# Patient Record
Sex: Female | Born: 1940
Health system: Southern US, Community
[De-identification: ages and names within clinical notes are randomized; demographics above are authoritative.]

## PROBLEM LIST (undated history)

## (undated) DIAGNOSIS — G243 Spasmodic torticollis: Secondary | ICD-10-CM

## (undated) DIAGNOSIS — F419 Anxiety disorder, unspecified: Secondary | ICD-10-CM

## (undated) DIAGNOSIS — F0390 Unspecified dementia without behavioral disturbance: Secondary | ICD-10-CM

## (undated) DIAGNOSIS — C160 Malignant neoplasm of cardia: Secondary | ICD-10-CM

## (undated) DIAGNOSIS — K5730 Diverticulosis of large intestine without perforation or abscess without bleeding: Secondary | ICD-10-CM

## (undated) DIAGNOSIS — F0280 Dementia in other diseases classified elsewhere without behavioral disturbance: Secondary | ICD-10-CM

## (undated) DIAGNOSIS — Z86010 Personal history of colonic polyps: Secondary | ICD-10-CM

## (undated) DIAGNOSIS — H4920 Sixth [abducent] nerve palsy, unspecified eye: Secondary | ICD-10-CM

## (undated) DIAGNOSIS — R011 Cardiac murmur, unspecified: Secondary | ICD-10-CM

## (undated) DIAGNOSIS — C49A4 Gastrointestinal stromal tumor of large intestine: Secondary | ICD-10-CM

## (undated) DIAGNOSIS — M1990 Unspecified osteoarthritis, unspecified site: Secondary | ICD-10-CM

## (undated) DIAGNOSIS — K219 Gastro-esophageal reflux disease without esophagitis: Secondary | ICD-10-CM

## (undated) DIAGNOSIS — H409 Unspecified glaucoma: Secondary | ICD-10-CM

## (undated) DIAGNOSIS — I10 Essential (primary) hypertension: Secondary | ICD-10-CM

## (undated) DIAGNOSIS — F304 Manic episode in full remission: Secondary | ICD-10-CM

## (undated) DIAGNOSIS — D649 Anemia, unspecified: Secondary | ICD-10-CM

## (undated) DIAGNOSIS — I459 Conduction disorder, unspecified: Secondary | ICD-10-CM

## (undated) HISTORY — DX: Anemia, unspecified: D64.9

## (undated) HISTORY — DX: Unspecified dementia, unspecified severity, without behavioral disturbance, psychotic disturbance, mood disturbance, and anxiety: F03.90

## (undated) HISTORY — DX: Gastro-esophageal reflux disease without esophagitis: K21.9

## (undated) HISTORY — DX: Sixth (abducent) nerve palsy, unspecified eye: H49.20

## (undated) HISTORY — DX: Unspecified glaucoma: H40.9

## (undated) HISTORY — PX: CATARACT EXTRACTION: SUR2

## (undated) HISTORY — DX: Spasmodic torticollis: G24.3

## (undated) HISTORY — DX: Malignant neoplasm of cardia: C16.0

---
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
## 1965-01-14 HISTORY — PX: OVARIAN CYST REMOVAL: SHX89

## 1984-01-15 HISTORY — PX: TOTAL VAGINAL HYSTERECTOMY: SHX2548

## 2007-09-21 ENCOUNTER — Ambulatory Visit: Payer: Self-pay | Admitting: Hematology and Oncology

## 2007-09-22 ENCOUNTER — Inpatient Hospital Stay (HOSPITAL_COMMUNITY): Admission: RE | Admit: 2007-09-22 | Discharge: 2007-09-28 | Payer: Self-pay | Admitting: Obstetrics & Gynecology

## 2007-09-22 ENCOUNTER — Encounter: Payer: Self-pay | Admitting: Gynecology

## 2007-09-22 HISTORY — PX: SMALL INTESTINE SURGERY: SHX150

## 2007-09-22 HISTORY — PX: BILATERAL SALPINGOOPHORECTOMY: SHX1223

## 2007-09-22 HISTORY — PX: ILEOCECETOMY: SHX5857

## 2007-09-24 ENCOUNTER — Ambulatory Visit: Payer: Self-pay | Admitting: Hematology and Oncology

## 2007-10-21 ENCOUNTER — Ambulatory Visit: Admission: RE | Admit: 2007-10-21 | Discharge: 2007-10-21 | Payer: Self-pay | Admitting: Gynecology

## 2007-10-28 LAB — CBC WITH DIFFERENTIAL/PLATELET
EOS%: 4.6 % (ref 0.0–7.0)
Eosinophils Absolute: 0.3 10*3/uL (ref 0.0–0.5)
HCT: 37.4 % (ref 34.8–46.6)
HGB: 12.4 g/dL (ref 11.6–15.9)
MCHC: 33.3 g/dL (ref 32.0–36.0)
MONO#: 0.4 10*3/uL (ref 0.1–0.9)
NEUT#: 4.9 10*3/uL (ref 1.5–6.5)
NEUT%: 69.2 % (ref 39.6–76.8)
Platelets: 307 10*3/uL (ref 145–400)
WBC: 7.1 10*3/uL (ref 3.9–10.0)
lymph#: 1.5 10*3/uL (ref 0.9–3.3)

## 2007-10-28 LAB — COMPREHENSIVE METABOLIC PANEL
AST: 15 U/L (ref 0–37)
Albumin: 4.4 g/dL (ref 3.5–5.2)
Alkaline Phosphatase: 74 U/L (ref 39–117)
BUN: 21 mg/dL (ref 6–23)
Potassium: 4.2 mEq/L (ref 3.5–5.3)
Sodium: 138 mEq/L (ref 135–145)
Total Bilirubin: 0.5 mg/dL (ref 0.3–1.2)

## 2007-10-28 LAB — LACTATE DEHYDROGENASE: LDH: 141 U/L (ref 94–250)

## 2007-11-02 ENCOUNTER — Ambulatory Visit (HOSPITAL_COMMUNITY): Admission: RE | Admit: 2007-11-02 | Discharge: 2007-11-02 | Payer: Self-pay | Admitting: Hematology and Oncology

## 2007-11-20 ENCOUNTER — Ambulatory Visit: Payer: Self-pay | Admitting: Hematology and Oncology

## 2007-11-24 LAB — CBC WITH DIFFERENTIAL/PLATELET
Basophils Absolute: 0 10*3/uL (ref 0.0–0.1)
Eosinophils Absolute: 0.3 10*3/uL (ref 0.0–0.5)
HCT: 32.3 % — ABNORMAL LOW (ref 34.8–46.6)
HGB: 10.9 g/dL — ABNORMAL LOW (ref 11.6–15.9)
LYMPH%: 18 % (ref 14.0–48.0)
MCHC: 33.7 g/dL (ref 32.0–36.0)
MONO#: 0.3 10*3/uL (ref 0.1–0.9)
NEUT%: 68.5 % (ref 39.6–76.8)
Platelets: 199 10*3/uL (ref 145–400)
WBC: 4.3 10*3/uL (ref 3.9–10.0)
lymph#: 0.8 10*3/uL — ABNORMAL LOW (ref 0.9–3.3)

## 2007-11-24 LAB — COMPREHENSIVE METABOLIC PANEL
ALT: 15 U/L (ref 0–35)
BUN: 20 mg/dL (ref 6–23)
CO2: 21 mEq/L (ref 19–32)
Calcium: 9.4 mg/dL (ref 8.4–10.5)
Chloride: 109 mEq/L (ref 96–112)
Creatinine, Ser: 1.42 mg/dL — ABNORMAL HIGH (ref 0.40–1.20)
Glucose, Bld: 99 mg/dL (ref 70–99)
Total Bilirubin: 0.4 mg/dL (ref 0.3–1.2)

## 2007-12-15 ENCOUNTER — Ambulatory Visit: Payer: Self-pay | Admitting: Infectious Disease

## 2007-12-15 ENCOUNTER — Ambulatory Visit: Payer: Self-pay | Admitting: Hematology and Oncology

## 2007-12-15 ENCOUNTER — Inpatient Hospital Stay (HOSPITAL_COMMUNITY): Admission: AD | Admit: 2007-12-15 | Discharge: 2007-12-17 | Payer: Self-pay | Admitting: Internal Medicine

## 2007-12-15 LAB — CBC WITH DIFFERENTIAL/PLATELET
Basophils Absolute: 0 10*3/uL (ref 0.0–0.1)
EOS%: 18.7 % — ABNORMAL HIGH (ref 0.0–7.0)
Eosinophils Absolute: 0.3 10*3/uL (ref 0.0–0.5)
HCT: 32.6 % — ABNORMAL LOW (ref 34.8–46.6)
HGB: 11.1 g/dL — ABNORMAL LOW (ref 11.6–15.9)
LYMPH%: 38.9 % (ref 14.0–48.0)
MCH: 31.6 pg (ref 26.0–34.0)
MCV: 92.9 fL (ref 81.0–101.0)
MONO%: 29.8 % — ABNORMAL HIGH (ref 0.0–13.0)
NEUT#: 0.2 10*3/uL — CL (ref 1.5–6.5)
NEUT%: 10.8 % — ABNORMAL LOW (ref 39.6–76.8)
Platelets: 188 10*3/uL (ref 145–400)

## 2007-12-15 LAB — COMPREHENSIVE METABOLIC PANEL
AST: 23 U/L (ref 0–37)
Albumin: 3.8 g/dL (ref 3.5–5.2)
Alkaline Phosphatase: 71 U/L (ref 39–117)
BUN: 17 mg/dL (ref 6–23)
Creatinine, Ser: 0.88 mg/dL (ref 0.40–1.20)
Glucose, Bld: 92 mg/dL (ref 70–99)

## 2007-12-24 LAB — CBC WITH DIFFERENTIAL/PLATELET
Basophils Absolute: 0 10*3/uL (ref 0.0–0.1)
EOS%: 6.7 % (ref 0.0–7.0)
Eosinophils Absolute: 0.4 10*3/uL (ref 0.0–0.5)
HCT: 32.4 % — ABNORMAL LOW (ref 34.8–46.6)
HGB: 10.9 g/dL — ABNORMAL LOW (ref 11.6–15.9)
MCH: 31.8 pg (ref 26.0–34.0)
MCV: 94.4 fL (ref 81.0–101.0)
NEUT#: 4.1 10*3/uL (ref 1.5–6.5)
NEUT%: 65.1 % (ref 39.6–76.8)
RDW: 17.3 % — ABNORMAL HIGH (ref 11.3–14.5)
lymph#: 1.3 10*3/uL (ref 0.9–3.3)

## 2007-12-24 LAB — COMPREHENSIVE METABOLIC PANEL
ALT: 22 U/L (ref 0–35)
CO2: 25 mEq/L (ref 19–32)
Calcium: 9.6 mg/dL (ref 8.4–10.5)
Chloride: 106 mEq/L (ref 96–112)
Creatinine, Ser: 1.27 mg/dL — ABNORMAL HIGH (ref 0.40–1.20)
Glucose, Bld: 128 mg/dL — ABNORMAL HIGH (ref 70–99)
Total Bilirubin: 0.4 mg/dL (ref 0.3–1.2)

## 2007-12-25 ENCOUNTER — Other Ambulatory Visit: Admission: RE | Admit: 2007-12-25 | Discharge: 2007-12-25 | Payer: Self-pay | Admitting: Obstetrics and Gynecology

## 2008-01-11 ENCOUNTER — Ambulatory Visit: Payer: Self-pay | Admitting: Hematology and Oncology

## 2008-01-13 LAB — CBC WITH DIFFERENTIAL/PLATELET
BASO%: 1.4 % (ref 0.0–2.0)
Eosinophils Absolute: 0.7 10*3/uL — ABNORMAL HIGH (ref 0.0–0.5)
HCT: 32.2 % — ABNORMAL LOW (ref 34.8–46.6)
LYMPH%: 23 % (ref 14.0–48.0)
MCHC: 34 g/dL (ref 32.0–36.0)
MONO#: 0.3 10*3/uL (ref 0.1–0.9)
NEUT#: 2.2 10*3/uL (ref 1.5–6.5)
Platelets: 240 10*3/uL (ref 145–400)
RBC: 3.35 10*6/uL — ABNORMAL LOW (ref 3.70–5.32)
WBC: 4.2 10*3/uL (ref 3.9–10.0)
lymph#: 1 10*3/uL (ref 0.9–3.3)

## 2008-01-19 LAB — CBC WITH DIFFERENTIAL/PLATELET
BASO%: 1.2 % (ref 0.0–2.0)
HCT: 36.7 % (ref 34.8–46.6)
MCHC: 33.8 g/dL (ref 32.0–36.0)
MONO#: 0.4 10*3/uL (ref 0.1–0.9)
NEUT%: 67 % (ref 39.6–76.8)
RDW: 16.2 % — ABNORMAL HIGH (ref 11.3–14.5)
WBC: 5.1 10*3/uL (ref 3.9–10.0)
lymph#: 0.9 10*3/uL (ref 0.9–3.3)

## 2008-01-19 LAB — BASIC METABOLIC PANEL
CO2: 24 mEq/L (ref 19–32)
Chloride: 106 mEq/L (ref 96–112)
Creatinine, Ser: 1.06 mg/dL (ref 0.40–1.20)
Potassium: 4 mEq/L (ref 3.5–5.3)
Sodium: 139 mEq/L (ref 135–145)

## 2008-02-18 LAB — CBC WITH DIFFERENTIAL/PLATELET
Eosinophils Absolute: 0.4 10*3/uL (ref 0.0–0.5)
HCT: 31.3 % — ABNORMAL LOW (ref 34.8–46.6)
LYMPH%: 7 % — ABNORMAL LOW (ref 14.0–48.0)
MONO#: 0.6 10*3/uL (ref 0.1–0.9)
NEUT#: 8.6 10*3/uL — ABNORMAL HIGH (ref 1.5–6.5)
NEUT%: 83.2 % — ABNORMAL HIGH (ref 39.6–76.8)
Platelets: 252 10*3/uL (ref 145–400)
WBC: 10.3 10*3/uL — ABNORMAL HIGH (ref 3.9–10.0)
lymph#: 0.7 10*3/uL — ABNORMAL LOW (ref 0.9–3.3)

## 2008-02-18 LAB — BASIC METABOLIC PANEL
CO2: 22 mEq/L (ref 19–32)
Calcium: 8.8 mg/dL (ref 8.4–10.5)
Chloride: 109 mEq/L (ref 96–112)
Creatinine, Ser: 0.75 mg/dL (ref 0.40–1.20)
Glucose, Bld: 104 mg/dL — ABNORMAL HIGH (ref 70–99)
Sodium: 142 mEq/L (ref 135–145)

## 2008-02-25 ENCOUNTER — Encounter: Admission: RE | Admit: 2008-02-25 | Discharge: 2008-02-25 | Payer: Self-pay | Admitting: Internal Medicine

## 2008-03-11 ENCOUNTER — Other Ambulatory Visit: Admission: RE | Admit: 2008-03-11 | Discharge: 2008-03-11 | Payer: Self-pay | Admitting: Obstetrics and Gynecology

## 2008-03-15 ENCOUNTER — Ambulatory Visit: Payer: Self-pay | Admitting: Hematology and Oncology

## 2008-03-17 LAB — CBC WITH DIFFERENTIAL/PLATELET
BASO%: 0 % (ref 0.0–2.0)
EOS%: 0.2 % (ref 0.0–7.0)
Eosinophils Absolute: 0 10*3/uL (ref 0.0–0.5)
MCH: 33.8 pg (ref 25.1–34.0)
MCHC: 34 g/dL (ref 31.5–36.0)
MCV: 99.4 fL (ref 79.5–101.0)
MONO%: 2.7 % (ref 0.0–14.0)
NEUT#: 7.6 10*3/uL — ABNORMAL HIGH (ref 1.5–6.5)
RBC: 3.55 10*6/uL — ABNORMAL LOW (ref 3.70–5.45)
RDW: 15.1 % — ABNORMAL HIGH (ref 11.2–14.5)

## 2008-03-17 LAB — BASIC METABOLIC PANEL
Calcium: 9.6 mg/dL (ref 8.4–10.5)
Potassium: 4.4 mEq/L (ref 3.5–5.3)
Sodium: 139 mEq/L (ref 135–145)

## 2008-04-07 ENCOUNTER — Encounter: Admission: RE | Admit: 2008-04-07 | Discharge: 2008-04-07 | Payer: Self-pay | Admitting: Internal Medicine

## 2008-04-14 ENCOUNTER — Ambulatory Visit (HOSPITAL_COMMUNITY): Admission: RE | Admit: 2008-04-14 | Discharge: 2008-04-14 | Payer: Self-pay | Admitting: Hematology and Oncology

## 2008-04-19 LAB — CBC WITH DIFFERENTIAL/PLATELET
Eosinophils Absolute: 0.1 10*3/uL (ref 0.0–0.5)
MCV: 100.5 fL (ref 79.5–101.0)
MONO%: 7.5 % (ref 0.0–14.0)
NEUT#: 2 10*3/uL (ref 1.5–6.5)
RBC: 3.42 10*6/uL — ABNORMAL LOW (ref 3.70–5.45)
RDW: 15.8 % — ABNORMAL HIGH (ref 11.2–14.5)
WBC: 3.3 10*3/uL — ABNORMAL LOW (ref 3.9–10.3)

## 2008-04-19 LAB — COMPREHENSIVE METABOLIC PANEL
AST: 18 U/L (ref 0–37)
Albumin: 4 g/dL (ref 3.5–5.2)
BUN: 18 mg/dL (ref 6–23)
Calcium: 9 mg/dL (ref 8.4–10.5)
Chloride: 111 mEq/L (ref 96–112)
Potassium: 3.8 mEq/L (ref 3.5–5.3)
Total Protein: 5.9 g/dL — ABNORMAL LOW (ref 6.0–8.3)

## 2008-05-13 ENCOUNTER — Ambulatory Visit: Payer: Self-pay | Admitting: Hematology and Oncology

## 2008-05-17 ENCOUNTER — Encounter: Admission: RE | Admit: 2008-05-17 | Discharge: 2008-05-17 | Payer: Self-pay | Admitting: Family Medicine

## 2008-06-14 LAB — CBC WITH DIFFERENTIAL/PLATELET
EOS%: 2.4 % (ref 0.0–7.0)
MCH: 34.7 pg — ABNORMAL HIGH (ref 25.1–34.0)
MCHC: 34.1 g/dL (ref 31.5–36.0)
MCV: 101.8 fL — ABNORMAL HIGH (ref 79.5–101.0)
MONO%: 6.2 % (ref 0.0–14.0)
RBC: 3.23 10*6/uL — ABNORMAL LOW (ref 3.70–5.45)
RDW: 16 % — ABNORMAL HIGH (ref 11.2–14.5)

## 2008-06-17 ENCOUNTER — Other Ambulatory Visit: Admission: RE | Admit: 2008-06-17 | Discharge: 2008-06-17 | Payer: Self-pay | Admitting: Obstetrics and Gynecology

## 2008-07-13 ENCOUNTER — Ambulatory Visit: Payer: Self-pay | Admitting: Hematology and Oncology

## 2008-07-19 LAB — COMPREHENSIVE METABOLIC PANEL
ALT: 12 U/L (ref 0–35)
AST: 17 U/L (ref 0–37)
Albumin: 4.4 g/dL (ref 3.5–5.2)
Calcium: 9.8 mg/dL (ref 8.4–10.5)
Chloride: 108 mEq/L (ref 96–112)
Potassium: 4.5 mEq/L (ref 3.5–5.3)

## 2008-07-19 LAB — CBC WITH DIFFERENTIAL/PLATELET
BASO%: 0.4 % (ref 0.0–2.0)
Basophils Absolute: 0 10*3/uL (ref 0.0–0.1)
EOS%: 2.7 % (ref 0.0–7.0)
HGB: 13 g/dL (ref 11.6–15.9)
MCH: 35 pg — ABNORMAL HIGH (ref 25.1–34.0)
MCHC: 34.6 g/dL (ref 31.5–36.0)
MONO#: 0.4 10*3/uL (ref 0.1–0.9)
RDW: 13.7 % (ref 11.2–14.5)
WBC: 6.2 10*3/uL (ref 3.9–10.3)
lymph#: 0.9 10*3/uL (ref 0.9–3.3)

## 2008-08-04 ENCOUNTER — Encounter: Admission: RE | Admit: 2008-08-04 | Discharge: 2008-08-04 | Payer: Self-pay | Admitting: Gastroenterology

## 2008-10-24 DIAGNOSIS — Z860100 Personal history of colon polyps, unspecified: Secondary | ICD-10-CM

## 2008-10-24 HISTORY — DX: Personal history of colon polyps, unspecified: Z86.0100

## 2008-10-24 HISTORY — DX: Personal history of colonic polyps: Z86.010

## 2008-10-31 ENCOUNTER — Ambulatory Visit: Payer: Self-pay | Admitting: Internal Medicine

## 2008-11-02 LAB — COMPREHENSIVE METABOLIC PANEL
Calcium: 9.4 mg/dL (ref 8.4–10.5)
Potassium: 4.3 mEq/L (ref 3.5–5.3)
Sodium: 140 mEq/L (ref 135–145)
Total Bilirubin: 0.4 mg/dL (ref 0.3–1.2)

## 2008-11-02 LAB — CBC WITH DIFFERENTIAL/PLATELET
BASO%: 0.5 % (ref 0.0–2.0)
EOS%: 1.6 % (ref 0.0–7.0)
HGB: 12.6 g/dL (ref 11.6–15.9)
MCH: 31.9 pg (ref 25.1–34.0)
MCHC: 33.9 g/dL (ref 31.5–36.0)
MONO#: 0.4 10*3/uL (ref 0.1–0.9)
RDW: 14.3 % (ref 11.2–14.5)
WBC: 5.8 10*3/uL (ref 3.9–10.3)
lymph#: 1.3 10*3/uL (ref 0.9–3.3)

## 2008-11-04 ENCOUNTER — Ambulatory Visit (HOSPITAL_COMMUNITY): Admission: RE | Admit: 2008-11-04 | Discharge: 2008-11-04 | Payer: Self-pay | Admitting: Hematology and Oncology

## 2009-01-17 ENCOUNTER — Other Ambulatory Visit: Admission: RE | Admit: 2009-01-17 | Discharge: 2009-01-17 | Payer: Self-pay | Admitting: Obstetrics and Gynecology

## 2009-02-07 ENCOUNTER — Ambulatory Visit: Payer: Self-pay | Admitting: Internal Medicine

## 2009-02-07 LAB — CBC WITH DIFFERENTIAL/PLATELET
BASO%: 0.6 % (ref 0.0–2.0)
EOS%: 0.7 % (ref 0.0–7.0)
HCT: 40.7 % (ref 34.8–46.6)
LYMPH%: 20.1 % (ref 14.0–49.7)
MCH: 33.5 pg (ref 25.1–34.0)
MCV: 97.2 fL (ref 79.5–101.0)
MONO#: 0.2 10*3/uL (ref 0.1–0.9)
NEUT#: 5.1 10*3/uL (ref 1.5–6.5)
Platelets: 254 10*3/uL (ref 145–400)
RBC: 4.19 10*6/uL (ref 3.70–5.45)
RDW: 13.9 % (ref 11.2–14.5)

## 2009-02-07 LAB — LACTATE DEHYDROGENASE: LDH: 129 U/L (ref 94–250)

## 2009-02-07 LAB — COMPREHENSIVE METABOLIC PANEL
ALT: 11 U/L (ref 0–35)
Alkaline Phosphatase: 78 U/L (ref 39–117)
CO2: 25 mEq/L (ref 19–32)
Calcium: 9.6 mg/dL (ref 8.4–10.5)
Glucose, Bld: 81 mg/dL (ref 70–99)
Potassium: 4.3 mEq/L (ref 3.5–5.3)

## 2009-02-16 ENCOUNTER — Ambulatory Visit (HOSPITAL_COMMUNITY): Admission: RE | Admit: 2009-02-16 | Discharge: 2009-02-16 | Payer: Self-pay | Admitting: Hematology and Oncology

## 2009-08-07 ENCOUNTER — Ambulatory Visit: Payer: Self-pay | Admitting: Internal Medicine

## 2009-08-09 LAB — COMPREHENSIVE METABOLIC PANEL
ALT: 10 U/L (ref 0–35)
AST: 17 U/L (ref 0–37)
Albumin: 4.5 g/dL (ref 3.5–5.2)
Creatinine, Ser: 0.88 mg/dL (ref 0.40–1.20)
Glucose, Bld: 86 mg/dL (ref 70–99)
Potassium: 4.5 mEq/L (ref 3.5–5.3)
Sodium: 138 mEq/L (ref 135–145)
Total Bilirubin: 0.5 mg/dL (ref 0.3–1.2)
Total Protein: 6.6 g/dL (ref 6.0–8.3)

## 2009-08-09 LAB — CBC WITH DIFFERENTIAL/PLATELET
Basophils Absolute: 0 10*3/uL (ref 0.0–0.1)
Eosinophils Absolute: 0.1 10*3/uL (ref 0.0–0.5)
HCT: 40.1 % (ref 34.8–46.6)
HGB: 13.9 g/dL (ref 11.6–15.9)
LYMPH%: 20.1 % (ref 14.0–49.7)
MCV: 96.2 fL (ref 79.5–101.0)
MONO#: 0.3 10*3/uL (ref 0.1–0.9)
MONO%: 4.8 % (ref 0.0–14.0)
NEUT#: 4.3 10*3/uL (ref 1.5–6.5)
NEUT%: 73.8 % (ref 38.4–76.8)
Platelets: 243 10*3/uL (ref 145–400)
RBC: 4.17 10*6/uL (ref 3.70–5.45)
WBC: 5.8 10*3/uL (ref 3.9–10.3)

## 2009-12-01 DIAGNOSIS — F304 Manic episode in full remission: Secondary | ICD-10-CM

## 2009-12-01 HISTORY — DX: Manic episode in full remission: F30.4

## 2009-12-22 DIAGNOSIS — F411 Generalized anxiety disorder: Secondary | ICD-10-CM | POA: Diagnosis present

## 2010-02-02 ENCOUNTER — Ambulatory Visit: Payer: Self-pay | Admitting: Hematology and Oncology

## 2010-02-06 LAB — COMPREHENSIVE METABOLIC PANEL
Albumin: 4.5 g/dL (ref 3.5–5.2)
Alkaline Phosphatase: 59 U/L (ref 39–117)
BUN: 19 mg/dL (ref 6–23)
Creatinine, Ser: 0.83 mg/dL (ref 0.40–1.20)
Potassium: 4.2 mEq/L (ref 3.5–5.3)
Total Protein: 6.5 g/dL (ref 6.0–8.3)

## 2010-02-06 LAB — CBC WITH DIFFERENTIAL/PLATELET
Eosinophils Absolute: 0.1 10*3/uL (ref 0.0–0.5)
HCT: 38.3 % (ref 34.8–46.6)
HGB: 12.9 g/dL (ref 11.6–15.9)
LYMPH%: 21.2 % (ref 14.0–49.7)
MONO#: 0.4 10*3/uL (ref 0.1–0.9)
MONO%: 5.2 % (ref 0.0–14.0)
NEUT#: 5.1 10*3/uL (ref 1.5–6.5)
WBC: 7.1 10*3/uL (ref 3.9–10.3)
lymph#: 1.5 10*3/uL (ref 0.9–3.3)

## 2010-02-08 ENCOUNTER — Other Ambulatory Visit: Payer: Self-pay | Admitting: Hematology and Oncology

## 2010-02-08 DIAGNOSIS — C49A0 Gastrointestinal stromal tumor, unspecified site: Secondary | ICD-10-CM

## 2010-05-29 NOTE — Op Note (Signed)
NAME:  Tracey [name], Tracey [name]               ACCOUNT NO.:  [id number]   MEDICAL RECORD NO.:  [id number]          PATIENT TYPE:  INP   LOCATION:  0009                         FACILITY:  [facility]   PHYSICIAN:  De Blanch, M.D.DATE OF BIRTH:  04-14-1940   DATE OF PROCEDURE:  09/22/2007  DATE OF DISCHARGE:                               OPERATIVE REPORT   PREOPERATIVE DIAGNOSIS:  Bilateral complex pelvic masses.   POSTOPERATIVE DIAGNOSIS:  Left ovarian fibroma.  Small bowel tumor  arising from the proximal ileum and involving what appeared to be the  ovary and cecum.   FINAL PATHOLOGY:  Pathologic diagnosis is pending, retroperitoneal  fibrosis.   PROCEDURE:  Exploratory laparotomy, bilateral salpingo-oophorectomy,  bilateral ureterolysis, small bowel resection with anastomosis,  resection cecum and terminal ileum with reanastomosis.   SURGEON:  De Blanch, M.D.   ASSISTANT:  Antionette Char, M.D., Telford Nab, R.N.   ANESTHESIA:  General with orotracheal tube.   ESTIMATED BLOOD LOSS:  500 mL.   SURGICAL FINDINGS:  At the time of exploratory laparotomy the patient  had an inflammatory appearing mass in the right side of the pelvis  measuring approximately 7-8 cm in diameter.  A loop of proximal ileum  was densely adherent to the mass and on frozen section the pathologist  felt that this was arising from the ileum.  It was also densely adherent  to the right pelvic sidewall and bladder.  The cecum was also adherent  to it and after removal of the mass the firm area of cecum was thickened  and firm.  The left ovary was approximately 7 cm in diameter,  multinodular and multicystic and solid.  On frozen section this was  determined be a cystadenofibroma.  The remainder of the peritoneal  cavity including diaphragm, liver, spleen, stomach, omentum and  remainder of small bowel and colon were all normal.  There was no  adenopathy.  At the completion of the surgical  procedure all gross tumor  was resected.   PROCEDURE IN DETAIL:  The patient was brought to the operating room and  after satisfactory attainment of general anesthesia was placed in  modified lithotomy position in [place] stirrups.  Anterior abdominal wall,  perineum and vagina were prepped with Betadine and a Foley catheter was  inserted.  The abdomen was entered through a previous Pfannenstiel  incision.  Peritoneal washings were obtained.  The upper abdomen and  pelvis were explored with the above-noted findings.  Bookwalter  retractor was assembled.  Using sharp and blunt dissection the tumor was  mobilized away from the bladder peritoneum.  The tumor was densely  adherent to the right pelvic sidewall and loop of proximal ileum.  In  order to resect the tumor a portion of the ileum was resected.  Using  the GIA stapler the proximal and distal portions of the ileum on either  side of the mass were transected and then the mesentery of the ileum  divided using Universal GIA stapler.  Once this was mobilized we entered  the retroperitoneal space on the right.  The ureter was identified and  ureterolysis  was performed to beneath the level of the uterine vessels.  The ovarian vessels were skeletonized, clamped, cut and suture ligated  and free tied.  The cecum was sharply dissected away from the mass as  well.  Using sharp and blunt dissection the pelvic sidewall was opened  developing pararectal and paravesical spaces.  The mass was further  mobilized away from the peritoneum and ultimately was removed from the  pelvis and submitted to frozen section with the above-noted findings.   Attention was turned to the mass on the left side of the pelvis.  The  left retroperitoneum was opened.  Retroperitoneal fibrosis was again  encountered.  Ureterolysis was performed.  The ovarian vessels were  skeletonized, clamped, cut, free tied and suture ligated.  In a similar  fashion the left pelvic  sidewall peritoneum was incised beneath the  mass.  The mass was adherent to the vaginal angle which was cross  clamped, divided and suture ligated.  This mass was submitted to frozen  section returning as a cyst adenofibroma.   The pelvis was inspected and found to be hemostatic.  Packs were placed  and the small bowel resection was identified.  A side-to-side  anastomosis was then created using the GIA 55 stapler and the enterotomy  closed with a TX 60 stapler.  The mesentery of the small bowel was  reapproximated with interrupted sutures of 2-0 Vicryl.   Frozen section returned that the tumor in the small bowel and the right  pelvis could possibly be a GIST tumor and therefore it was felt  advisable to resect the thickened area of the cecum.  The terminal ileum  was isolated and divided with the GIA stapler.  A peritoneal incision  was made up along the line of Toldt and the ascending colon and cecum  mobilized.  The mid descending colon was then divided with a GIA 75  stapler and the mesentery of the ileum and the ascending colon divided  using the Universal GIA stapler.  Some bleeding was encountered in the  mesentery which was controlled with interrupted figure-of-eight sutures  of 2-0 Vicryl.  Once the specimen was resected the ileum was  reanastomosed to the ascending colon using the GIA 75 stapler and a TA  60 stapler.  The mesentery was again closed with interrupted sutures of  2-0 Vicryl.   Attention was turned to the pelvis where some thickened peritoneum along  the right pelvic sidewall remained which had been adherent to the tumor.  It was felt appropriate to resect this in case it contained any residual  GIST tumor.  The peritoneum was stripped from the retroperitoneum with  care taken to avoid the ureter.  The uterine vessels were identified and  clipped as they passed over the ureter.   The pelvis was reinspected and found to be hemostatic.  Seprafilm was  placed in  the pelvis.  The retractors and packs were removed.  The  anterior abdominal wall was closed in layers, the first being a running  2-0 Vicryl suture on the peritoneum.  The fascia was closed with a  running suture of #1 PDS.   The patient had desired a minimal abdominoplasty.  The superior portion  of the skin flap was created above the fascia, mobilized downward and  then a portion of skin and fat was excised.  Hemostasis achieved with  cautery.  The wound was irrigated and then reapproximated with  subcutaneous 3-0 Vicryl suture.  Skin was reapproximated  with skin  staples.  A dressing was applied.  The patient was awakened from  anesthesia and taken to the recovery room in satisfactory condition.   Please note that a nasogastric tube was placed before the end of the  procedure and its position in the stomach was confirmed with palpation.  Further, the instrument count was incorrect and a flat plate of the  abdomen was obtained showing no intra-abdominal instruments.      De Blanch, M.D.  Electronically Signed     DC/MEDQ  D:  09/22/2007  T:  09/23/2007  Job:  [id number]   cc:   Roseanna Rainbow, M.D.  Fax: [phone]   Telford Nab, R.N.  [address]  [place], [place] [address]   Jeralyn Ruths, MD

## 2010-05-29 NOTE — Discharge Summary (Signed)
NAME:  Tracey [name], Tracey [name]               ACCOUNT NO.:  [id number]   MEDICAL RECORD NO.:  [id number]          PATIENT TYPE:  INP   LOCATION:  1531                         [facility]ACILITY:  [facility]   PHYSICIAN:  Roseanna Rainbow, M.D.DATE OF BIRTH:  Jun 24, 1940   DATE OF ADMISSION:  09/22/2007  DATE OF DISCHARGE:  09/28/2007                               DISCHARGE SUMMARY   CHIEF COMPLAINT:  The patient is a 70 year old who presents for surgical  management of newly diagnosed bilateral adnexal masses.  Please see the  dictated history and physical as per Dr. Reuel Boom L. Clarke-Pearson for  further details.   HOSPITAL COURSE:  The patient was admitted and underwent a bilateral  salpingo-oophorectomy, ureterolysis, small bowel resection, anastomosis,  resection of cecum and ileum.  Please see the dictated operative  summary.  On postoperative day #1 her hemoglobin was 10.7.  An NG tube  had been placed intraoperatively and discontinued with small drainage.  On postoperative day  #2 the NG tube was clamped.  This was tolerated.  A medical oncology  consultation was obtained for the newly-diagnosed GIST tumor by Dr.  Dalene Carrow.  Please see the dictated consultation note.  The NG tube was  then discontinued.  Her diet was advanced.  She was complaining of a  sore throat.  It was unclear if this was related to the NG tube  placement or a pharyngitis.  A rapid strep was negative.  She was then  discharged to home.   DISCHARGE DIAGNOSIS:  Gastrointestinal stromal tumor.   PROCEDURES:  1. Bilateral salpingo-oophorectomy.  2. Ureterolysis.  3. Small bowel resection.  4. Anastomosis, resection of cecum and ileum.   CONDITION:  Stable.   DIET:  Regular.   ACTIVITY:  Progressive activity, pelvic rest.   MEDICATIONS:  Please see the medication reconciliation form.   DISPOSITION:  The patient was to follow up with Dr. Stanford Breed on  October 7 at 8:45 a.m., and she was to follow up with Dr. Dalene Carrow in  3-4  weeks.      Roseanna Rainbow, M.D.  Electronically Signed     LAJ/MEDQ  D:  10/13/2007  T:  10/13/2007  Job:  [id number]   cc:   Telford Nab, R.N.  [address]  [place], [place] [address]   Vicente Serene I. Odogwu, M.D.  Fax: [phone]   Juluis Rainier, [phone], MD

## 2010-05-29 NOTE — Consult Note (Signed)
NAME:  Tracey [name], Tracey [name]               ACCOUNT NO.:  [id number]   MEDICAL RECORD NO.:  [id number]          PATIENT TYPE:  INP   LOCATION:  1531                         [facility]ACILITY:  [facility]   PHYSICIAN:  Lauretta I. Odogwu, M.D.DATE OF BIRTH:  1940/03/18   DATE OF CONSULTATION:  09/24/2007  DATE OF DISCHARGE:                                 CONSULTATION   REASON FOR CONSULTATION:  Patient with a GIST tumor.   FINDINGS:  The patient is a pleasant 70 year old woman who in February  of this year presented with symptoms of fatigue.  The patient is known  to have a history of iron-deficiency anemia.  The patient was placed on  a course of iron, however, her clinical course was complicated by  constipation without improvement in her hemoglobin or symptoms.  A few  months later she presented with intense abdominal pain.  Dr. Zachery Dauer had  obtained a CT scan of the pelvis on September 08, 2007, which showed 2  large, multiloculated cystic masses which were septated in the adnexal  region.  The right adnexal mass contained focal calcifications, and the  left adnexal mass contained a small area of fat density.  The right mass  measured 8.7 x  6.6 x 7.4 cm and the left mass measured 8.4 x 6 x 9 cm.  There was no evidence of fluid in the cul-de-sac or abdomen.  The lung  bases showed no mass infiltrate, edema or effusion.  The patient was  subsequently referred to gynecology oncology and on September 22, 2007.  Dr. Stanford Breed performed an exploratory laparotomy, bilateral  salpingo-oophorectomy, bilateral ureterolysis with small bowel resection  and cecal resection with anastomosis.  At the time of surgery, a 7 x 8  cm inflammatory-appearing mass was seen on the right side of the pelvis,  with a loop of proximal ileum densely adherent to the mass.  The mass  was also densely adherent to the right pelvic sidewall, bladder and  cecum.  The left ovary, which was 7 cm in size, was multinodular and  multicystic  and solid, and on frozen section was determined to be a  cystadenofibroma, and the remainder of the peritoneal cavity was all  normal.  Surgical pathology from the right ovary, fallopian tube and  small bowel segment showed a 10.8-cm gastrointestinal stromal tumor with  hemorrhage involving the small bowel eroding into the mucosal surface,  with associated extensive hemorrhage and infarction.  Section showed  serosal proliferation with perinuclear vacuoles.  There was no  significant atypia, mitotic activity or necrosis seen. The resection  margins were not involved.  There was no evidence of vascular perineural  involvement and none of the 6 lymph nodes sampled had evidence of  malignancy.  Immunohistochemical staining was strongly positive for  CD117, negative for CD34, YT1 and inhibin.  The overall findings were  diagnostic for a gastrointestinal stromal tumor.  No viable ovarian or  fallopian tube tissue was seen grossly or microscopically.   PAST HISTORY:  1. Arthritis.  2. Status post myomectomy for a fibroid 43 years ago.   OUTPATIENT MEDICATIONS:  Tylenol and coated aspirin.   ALLERGIES:  PENICILLIN.   SOCIAL HISTORY:  The patient is married with 2 children.  She works as a  Neurosurgeon.  Her husband is currently undergoing treatment for sarcoma.  He is receiving radiation treatment.  She denies a history of alcohol or  tobacco use.   FAMILY HISTORY:  Negative for oncologic, hematological malignancies.   REVIEW OF SYSTEMS:  CONSTITUTIONAL:  Currently has surgical pain.  Also  has an NG tube in place.  Denies nausea, vomiting.  Denies a history of  chest pain, PND, orthopnea, ankle swelling.  RESPIRATIONS:  Denies a  history of cough, hemoptysis, wheeze, or shortness of breath.  GU:  Denies dysuria, hematuria, nocturia.  SKIN:  No bruising or bleeding.  NEUROLOGIC:  Denies a history of headaches, vision change, or weakness.   PHYSICAL EXAM:  The patient is alert and  oriented x3.  VITALS:  Pulse 74, blood pressure 142/65, temperature 98.5, respirations  18, sats 97% on room air.  HEENT:  Head atraumatic, normocephalic.  Sclerae are anicteric.  Pupils  equal.  Mouth without thrush.  NG tube in place.  NECK:  Supple.  CHEST:  Clear bilaterally with decreased air entry in both bases.  CVS:  Unremarkable.  ABDOMEN:  Shows a lateral incision that was covered with gauze.  Bowel  sounds decreased.  EXTREMITIES:  No edema.  Pulses present and symmetrical.   IMPRESSION AND PLAN:  The patient is a 70-year woman who status post  exploratory laparotomy with bilateral salpingo-oophorectomy, small bowel  and cecum resection with anastomosis for a 10-cm gastrointestinal  stromal tumor arising from the proximal ileum.  There was no mitotic  activity but there were areas of hemorrhage and ulceration.  We spent  some time describing sarcomas with the patient.  We also talked about  gastrointestinal stromal tumors.  The patient was told that based on  size of the tumor and the fact that the tumor arose from the small  bowel, her risk for progressive disease was in the order of 52%.  She  was told that Gleevec was the standard care in the adjuvant setting.  The patient is still recovering from her recent surgery.  A follow-up  visit with oncology in the outpatient department will be made in 3-4  weeks' time so that her treatment options and further questions can be  addressed in further detail.  All questions were answered.      Lauretta I. Odogwu, M.D.  Electronically Signed     LIO/MEDQ  D:  09/24/2007  T:  09/24/2007  Job:  [id number]   cc:   De Blanch, M.D.  [address]  [place]  [place] [address]   Juluis Rainier, M.D.  Fax: [phone]

## 2010-05-29 NOTE — Consult Note (Signed)
NAME:  Tracey [name], Tracey [name]               ACCOUNT NO.:  [id number]   MEDICAL RECORD NO.:  [id number]          PATIENT TYPE:  OUT   LOCATION:  GYN                          FACILITY:  [facility]   PHYSICIAN:  De Blanch, M.D.DATE OF BIRTH:  1940/01/24   DATE OF CONSULTATION:  10/21/2007  DATE OF DISCHARGE:                                 CONSULTATION   CHIEF COMPLAINT:  Postoperative followup.   Patient returns today for postoperative followup, having undergone a  resection of the small bowel, right colectomy, and left salpingo-  oophorectomy on September 22, 2007.  Initial surgery was performed for a  pelvic mass suspicious for an ovarian malignancy.  Surprisingly, our  findings included a mass arising from the terminal ileum, which was a  GIST tumor.  She also had nodularity and fibrosis of the cecum, which  was resected.  Final pathology of this portion of the colon showed only  adhesions and chronic inflammation, possibly associated with a prior  appendectomy.  There is no evidence of metastatic disease.  All gross  tumor was resected.  The patient has had an uncomplicated postoperative  course.  She reports that she has normal GI and GU function, has minimal  discomfort in her incision, [place]   PHYSICAL EXAMINATION:  Weight 119 pounds.  ABDOMEN:  Soft and nontender.  Her Pfannenstiel incision is healing  well.  PELVIC:  EG/BUS, vagina, bladder, and urethra are normal.  Bimanual exam  reveals no masses, induration, nodularity, or tenderness.   IMPRESSION:  Excellent postoperative recovery.  Patient is given the  okay to return to full levels of activity at her six-week postoperative  mark.  She is scheduled to see Dr. Arlan Organ with regard to  management of the GIST (gastrointestinal stromal tumor) tumor in the  next couple of days.  She will return to the care of Dr. Zachery Dauer for  routine gynecologic care and overall health  care.      De Blanch, M.D.  Electronically Signed     DC/MEDQ  D:  10/21/2007  T:  10/21/2007  Job:  [id number]   cc:   Juluis Rainier, M.D.  Fax: [phone]   Jeralyn Ruths, M.D.   Roseanna Rainbow, M.D.  Fax: [phone]   Telford Nab, R.N.  [address]  [place], [place] [address]

## 2010-05-29 NOTE — Discharge Summary (Signed)
NAME:  Tracey [name], Tracey [name]               ACCOUNT NO.:  [id number]   MEDICAL RECORD NO.:  [id number]          PATIENT TYPE:  INP   LOCATION:  1343                         FACILITY:  [facility]   PHYSICIAN:  Hillery Aldo, M.D.   DATE OF BIRTH:  23-Sep-1940   DATE OF ADMISSION:  12/15/2007  DATE OF DISCHARGE:  12/17/2007                               DISCHARGE SUMMARY   PRIMARY CARE PHYSICIAN:  De Blanch, M.D.   ONCOLOGIST:  Vicente Serene I. Odogwu, M.D.   DISCHARGE DIAGNOSES:  1. Cellulitis secondary to cat bite with minor tenosynovitis.  2. Gastrointestinal stromal tumor.  3. Neutropenia status post chemotherapy.  4. Lower extremity rash.  5. Seasonal allergies.  6. Bipolar disorder.  7. Normocytic anemia.   DISCHARGE MEDICATIONS:  1. Lithium 350 mg b.i.d.  2. Claritin 10 mg daily.  3. Clindamycin 300 mg b.i.d. (Pt allergic to PCN and quinolones cause      prolonged QT in patient's on lithium)  4. Note, the patient was instructed to hold Gleevec until further      notice.   CONSULTATIONS:  1. Lauretta I. Odogwu, M.D. of Hematology/Oncology  2. Acey Lav, MD of Infectious Diseases.  3. Artist Pais. Mina Marble, M.D. of Hand Surgery.   BRIEF ADMISSION HISTORY OF PRESENT ILLNESS:  The patient is a 70-year-  old female referred by her primary oncologist for admission secondary to  cellulitis after a cat bite.  The patient was recently started on  Gleevec for treatment of a GIS tumor.  She has had surgery for this and  was receiving chemotherapy postoperatively when she developed erythema  around the site where she was bitten by her cat.  She developed  progressive erythema and swelling.  Dr. Dalene Carrow subsequently evaluated  her and referred her for direct admission for IV antibiotic therapy and  evaluation.  For the full details, please see the dictated report done  by Dr. Francene Boyers.   PROCEDURES/DIAGNOSTIC STUDIES:  MRI of the left wrist and hand showed  dorsal cellulitis  with extensor tenosynovitis.  No deep soft tissue  abscess identified.  Arthropathic changes present throughout the wrist  with prominent involvement of the first carpometacarpal articulation.  There are associated joint effusions, synovitis and subchondral cystic  changes suspicious for inflammatory arthropathy.  Joint infection could  not be excluded.  No gross bone destruction.   LABORATORY DATA:  Discharge laboratory values.  Sodium was 141,  potassium 3.8, chloride 113, bicarb 23, BUN 12, creatinine 0.84, glucose  107, white blood cell count was 3.3, hemoglobin 10.9, hematocrit 31.9,  platelets 176.   HOSPITAL COURSE:  1. Cellulitis/tenosynovitis secondary to cat bite:  The patient was      admitted and started on IV antibiotics including vancomycin and      imipenem.  An MRI scan showed changes of cellulitis with      tenosynovitis.  Because of this, Dr. Mina Marble of Hand Surgery was      called for consultation and evaluated the patient.  He did      recommend ongoing antibiotic therapy, but no need for surgery.  Although Avelox was recommened by the I.D. consultant, this can      cause prolonged QT interval in a patient on lithium and therefore      she was discharged on a 7 day course of therapy with clindamycin.  2. Gastrointestinal stomal tumor:  The patient will follow up with her      oncologist.  Gleevec has been on hold and will be restarted at the      discretion of the oncologist.  3. Neutropenia:  The patient has been receiving Neupogen in the      hospital and her discharge white blood cell count has improved.      She is scheduled to follow up at her oncologist office tomorrow for      another Neupogen injection.  4. Lower extremity rash:  This has completely resolved.  5. Seasonal allergies:  The patient was maintained on Claritin.  6. Bipolar disorder:  The patient was maintained on lithium therapy.  7. Normocytic anemia:  Likely due to underlying recent  chemotherapy.      No further diagnostic evaluation was undertaken.  Hemoglobin and      hematocrit were stable.   DISPOSITION:  The patient is discharged home.   FOLLOW UP:  Follow up with her primary care physician and her oncologist  in 1-2 weeks.   Time spent coordinating care for discharge and discharge instructions  equals 35 minutes.      Hillery Aldo, M.D.  Electronically Signed     CR/MEDQ  D:  12/17/2007  T:  12/17/2007  Job:  [id number]   cc:   De Blanch, M.D.  [address]  [place]  [place] [address]   Lauretta I. Odogwu, M.D.  Fax: [phone]

## 2010-05-29 NOTE — H&P (Signed)
NAME:  Tracey [name]               ACCOUNT NO.:  [id number]   MEDICAL RECORD NO.:  [id number]          PATIENT TYPE:  INP   LOCATION:  1343                         FACILITY:  [facility]   PHYSICIAN:  Estelle Grumbles, MDDATE OF BIRTH:  [birth year]   DATE OF ADMISSION:  12/15/2007  DATE OF DISCHARGE:                              HISTORY & PHYSICAL   CHIEF COMPLAINT:  Left hand redness with cat bite.   HISTORY OF PRESENT ILLNESS:  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  She underwent exploratory  laparotomy with bilateral salpingo-oophorectomy, small bowel sacral  resection with anastomosis.  Postoperatively, she was started on Gleevec  on November 03, 2007.  Patient had a cat bite on Sunday evening.  Subsequently, she developed erythema around the site with redness  involving the left wrist and left hand up to the proximal knuckles.  The  patient was evaluated by an oncologist today for a routine followup.  The patient also found to have neutropenia, so patient was referred to  the inpatient evaluation and management for cellulitis in a neutropenic  patient with current use of chemotherapy.  Patient denies having any  fever.  She is complaining of some facial puffiness and some edema in  her ankles.  She is also complaining of a blocking sensation in her left  ear.  Her doctors told her that her ear does not have any wax and is  clean.  She recommended antihistamines.   REVIEW OF SYSTEMS:  Patient denies having any headache, dizziness,  lightheadedness.  Denies having any oropharyngeal lesions.  Denies  having neck pain.  No chest pain.  No dyspnea.  No orthopnea or  paroxysmal nocturnal dyspnea.  Denies having any palpitations.  Denies  having abdominal pain.  No nausea, vomiting, diarrhea.  Tolerating diet  well.  No change in her bowel habits in the recent past.  Denies having  any dysuria.  No frequency or urgency.  Denies having any  myalgias or  arthralgias.  Other review of systems were asked and were negative.   PAST MEDICAL HISTORY:  1. As mentioned, GIST tumor.  2. Bipolar disease.   SURGICAL HISTORY:  1. Exploratory laparotomy with salpingo-oophorectomy.  2. Small bowel resection and anastomosis.   SOCIAL HISTORY:  She denies smoking, alcohol, or drug abuse.  She works  as a Neurosurgeon.   HOME MEDICATIONS:  1. Gleevec 400 mg a day.  2. Lithium 350 mg orally twice daily.  3. She was recently started on Claritin.   ALLERGIES:  PENICILLIN.  She gets a rash with it.   PHYSICAL EXAMINATION:  Patient is awake, alert and oriented, not in any  acute distress.  VITAL SIGNS:  Pending at this time.  Head is normocephalic and atraumatic.  Pupils are equal and reactive to  light and accommodation.  No icterus was noted.  Oral cavity:  No  oropharyngeal lesions.  CHEST:  Bilateral fair entry.  No crackles revealed.  HEART:  S1 and S2.  Regular rate and rhythm.  No murmurs are noted.  ABDOMEN:  Soft.  Bowel sounds are positive.  Nontender, nondistended.  No rebound or guarding.  EXTREMITIES:  No edema.  CNS:  No focal motor or neuro deficits.  Left upper extremity reveals small skin laceration, consistent with a  cat bite, a penetrating injury, approximately 3 cm in size in the dorsal  aspect of the left wrist.  Patient also has erythema in the lower one-  third of the left forearm and also dorsal aspect of the left hand up to  the knuckles in the area also in same spot.  No underlying abscess was  noted.  The patient states it was moderately erythematous yesterday, and  today it is a little better; however, it is becoming more diffuse now.  SKIN:  Patient has papular rash in the lower legs bilaterally,  especially in the anterior aspect.  She also has a similar rash in the  abdomen and in the anterior aspect of her chest.  It is a much lighter  degree.  Her face also seems a little puffy, especially around the  eyes.   LABS:  Pending at this time.   IMPRESSION:  1. Left wrist cellulitis with cat bite in a patient with the use of      chemotherapeutic agent and known neutropenia.  2. History of cancer, currently on chemotherapy.  3. Skin rash.  Seems to be drug-induced.   PLAN:  Will admit the patient to the medical unit.  Patient was started  on IV vancomycin and imipenem.  Will obtain the CBC and the complete  metabolic panel.  Adjust the dose of the imipenem and vancomycin  according to that.  Will also obtain the ESR, CRP, and blood cultures  x2.  Will obtain the MRI of the left wrist to rule out underlying  osteomyelitis.  If the patient has any deepening infection, she might  need hand surgery evaluation.  Patient was already evaluated by ID, Dr.  Daiva Eves, and also by oncologist.  Will follow up with them.      Estelle Grumbles, MD  Electronically Signed     TP/MEDQ  D:  12/15/2007  T:  12/15/2007  Job:  [id number]

## 2010-05-29 NOTE — Consult Note (Signed)
NAME:  Tracey [name], Tracey [name]               ACCOUNT NO.:  [id number]   MEDICAL RECORD NO.:  [id number]          PATIENT TYPE:  INP   LOCATION:  1343                         FACILITY:  [facility]   PHYSICIAN:  Acey Lav, MD  DATE OF BIRTH:  05-29-1940   DATE OF CONSULTATION:  DATE OF DISCHARGE:                                 CONSULTATION   REQUESTING PHYSICIAN:  Lauretta I. Odogwu, M.D.   REASON FOR CONSULTATION:  Cat bite in a patient on chemotherapy.   HISTORY OF PRESENT ILLNESS:  Tracey [name] is a 70 year old Caucasian female  who was just recently, this September, diagnosed with a GIST  (gastrointestinal stromal tumor) and had been placed on Gleevec.  Her  past medical history otherwise is significant only for a history of  bipolar disorder, for which she takes lithium and for a history of prior  myomectomy.   Tracey [name] was lying in  bed on Sunday evening and rolled over in the bed.  Her cat then bit her on the dorsum of her wrist.  The following day, she  noticed pain and swelling in the wrist, which she rated as being about  5/10 in severity and being localized to the wrist.  She did not feel  that it went deep; however, it was tender with flexion and extension of  the wrist.  The area did have a dark scar initially, and she then  applied some Neosporin to this.  The pain, however, persisted and  swelling became worse on Monday and persisted today.  She went to see  her oncologist, Dr. Dalene Carrow.  My colleague, Dr. Sampson Goon, was called  regarding her cat bite.  Patient denied fevers but did have some  subjective chills.  There was concern that she might become neutropenic  from her Gleevec as well.   On review of systems, she denies cough, abdominal pain, nausea,  vomiting, malaise.  She denies rashes or other dermatological  phenomenon.  She has not been on antibiotics yet for this infection.  She does have a history of an allergy to PENICILLIN as a child, which  she believes she had  when she was about 34 years of age.  Her parents had  noted that and told her that she had a rash while taking penicillin.  Of  note, this patient has received cefoxitin and also ertapenem  preoperatively recently  and tolerated these medications without any  problems.   PAST MEDICAL HISTORY:  GIST tumor, as mentioned above.  She underwent  exploratory laparotomy on September 22, 2007 with bilateral salpingo-  oophorectomy, bilateral ureterolysis, small bowel resection with  anastomosis and resection of the cecum and terminal ileum with  reanastomosis.  Prior to that, she had a remote history of a myomectomy  and an adenoidectomy as a child.  She has had two bouts of thrush while  on antibiotics.  She was diagnosed with diverticulitis prior to her GIST  tumor diagnosis.  She believes the diverticulitis diagnosis was a  misdiagnosis.  At that time, she was on antibiotics, but she does not  recall what they  were.   SOCIAL HISTORY:  Patient is married and lives at home with her husband.  She works as a Neurosurgeon.  She has one cat and one fish at home.  She  does not have a dog.  She does not keep any pet birds.  She has traveled  along the [address] as well as in [place] and [place].  She has no  foreign travel.  She has no recollection for ever testing positive for  tuberculosis by PPD skin testing.   FAMILY HISTORY:  Negative for any malignancies or hematological  problems.   REVIEW OF SYSTEMS:  As mentioned above, she is not having any nausea,  vomiting, diarrhea, constipation, abdominal pain.  She denies cough,  dyspnea, chest pain.  She denies wheezes.  She denies rashes.  The only  skin changes she has had is where she was bitten by her cat.  She has no  problems with headaches or problems with memory. Remainder of 10 point  review of systems is negative.   MEDICATIONS:  Patient is on lithium and Gleevec.   ALLERGIES:  Allergic reaction to PENICILLIN as a child with a rash,  but  the patient has tolerated cefoxitin and ertapenem without complications.   PHYSICAL EXAMINATION:  A pleasant lady in no acute distress, dressed in  her clothes, just having arrived in the hospital.  HEENT:  Normocephalic and atraumatic.  Pupils are equal, round and  reactive to light.  Sclerae are anicteric.  Oropharynx is clear without  exudate or lesions.  CARDIOVASCULAR:  Regular rate and rhythm without murmurs, rubs or  gallops.  LUNGS:  Clear to auscultation bilaterally without wheezes, rales or  rhonchi.  ABDOMEN:  Soft, nontender, nondistended.  LOWER EXTREMITIES:  Without edema.  MUSCULOSKELETAL:  Examination of the left wrist revealed a bite mark on  the dorsum of her wrist.  There, it was erythematous, warm, and tender  to palpation.  She also had tenderness with flexion and extension of  that wrist.   LABORATORY DATA:  Hospital labs are still pending at the time of this  dictation.  I was told the patient had been becoming neutropenic on her  most recent CBC.  I also do not have her metabolic panel yet.   IMPRESSION/RECOMMENDATIONS:  This is a 70 year old lady with recently  diagnosed gastrointestinal stromal tumor on Gleevec, who apparently has  become neutropenic and now sustained a cat bite on Sunday.   Cat bite:  Cat bites are notorious for being sources of infections with  Pasteurella multocida.  This organism was one to which human beings do  not have immunity.  Additionally, cat bites can introduce Streptococcal  species and other species that may reside on the host's skin, including  Staphylococcus aureus and including methicillin-resistant Staphylococcus  aureus.  Unfortunately, these type of bites can extend deeper to bone  quite easily, particularly when infections are inflicted through the  sharp teeth of a cat.  I am going to start Tracey [name] on imipenem and  vancomycin.  This will cover Pasteurella, streptococcal species, as well  as  methicillin-resistant Staphylococcus aureus.  The patient should be  able to tolerate imipenem without any problems, given that she has taken  ertapenem previously prior to her surgery.  I am going to order an MRI  to look for deeper infection, namely of the bones and tendons.  Fortunately, she was bitten on her dorsal wrist.  She would have amuch  higher risk for acquiring  a tendonitis from this with a ventral wound.  Nonetheless, we will follow up the scan.  Additionally, before  initiating antibiotics, I will check blood cultures to see if  Pasteurella or other organisms has gotten into her blood stream.  We  will follow her closely.  Also check a sed rate and C-reactive protein  in the morning.  My colleague, Dr. Orvan Falconer, will be on tomorrow to  follow her closely in the hospital.  Thank you for this infectious  disease consult.      Acey Lav, MD  Electronically Signed     CV/MEDQ  D:  12/15/2007  T:  12/15/2007  Job:  [id number]

## 2010-08-06 ENCOUNTER — Ambulatory Visit (HOSPITAL_COMMUNITY)
Admission: RE | Admit: 2010-08-06 | Discharge: 2010-08-06 | Disposition: A | Discharge status: Home / Self Care | Payer: Medicare Other | Source: Ambulatory Visit | Attending: Hematology and Oncology | Admitting: Hematology and Oncology

## 2010-08-06 ENCOUNTER — Other Ambulatory Visit: Payer: Self-pay | Admitting: Hematology and Oncology

## 2010-08-06 ENCOUNTER — Other Ambulatory Visit (HOSPITAL_COMMUNITY): Payer: Self-pay

## 2010-08-06 ENCOUNTER — Encounter (HOSPITAL_BASED_OUTPATIENT_CLINIC_OR_DEPARTMENT_OTHER): Payer: Medicare Other | Admitting: Hematology and Oncology

## 2010-08-06 DIAGNOSIS — J479 Bronchiectasis, uncomplicated: Secondary | ICD-10-CM | POA: Insufficient documentation

## 2010-08-06 DIAGNOSIS — J218 Acute bronchiolitis due to other specified organisms: Secondary | ICD-10-CM | POA: Insufficient documentation

## 2010-08-06 DIAGNOSIS — M2469 Ankylosis, other specified joint: Secondary | ICD-10-CM | POA: Insufficient documentation

## 2010-08-06 DIAGNOSIS — C49A0 Gastrointestinal stromal tumor, unspecified site: Secondary | ICD-10-CM

## 2010-08-06 DIAGNOSIS — M51379 Other intervertebral disc degeneration, lumbosacral region without mention of lumbar back pain or lower extremity pain: Secondary | ICD-10-CM | POA: Insufficient documentation

## 2010-08-06 DIAGNOSIS — M5137 Other intervertebral disc degeneration, lumbosacral region: Secondary | ICD-10-CM | POA: Insufficient documentation

## 2010-08-06 DIAGNOSIS — C494 Malignant neoplasm of connective and soft tissue of abdomen: Secondary | ICD-10-CM

## 2010-08-06 DIAGNOSIS — M47817 Spondylosis without myelopathy or radiculopathy, lumbosacral region: Secondary | ICD-10-CM | POA: Insufficient documentation

## 2010-08-06 DIAGNOSIS — K7689 Other specified diseases of liver: Secondary | ICD-10-CM | POA: Insufficient documentation

## 2010-08-06 DIAGNOSIS — Z9089 Acquired absence of other organs: Secondary | ICD-10-CM | POA: Insufficient documentation

## 2010-08-06 DIAGNOSIS — R928 Other abnormal and inconclusive findings on diagnostic imaging of breast: Secondary | ICD-10-CM | POA: Insufficient documentation

## 2010-08-06 DIAGNOSIS — Z9049 Acquired absence of other specified parts of digestive tract: Secondary | ICD-10-CM | POA: Insufficient documentation

## 2010-08-06 DIAGNOSIS — M4120 Other idiopathic scoliosis, site unspecified: Secondary | ICD-10-CM | POA: Insufficient documentation

## 2010-08-06 DIAGNOSIS — M87059 Idiopathic aseptic necrosis of unspecified femur: Secondary | ICD-10-CM | POA: Insufficient documentation

## 2010-08-06 LAB — CMP (CANCER CENTER ONLY)
ALT(SGPT): 15 U/L (ref 10–47)
CO2: 28 mEq/L (ref 18–33)
Calcium: 9.5 mg/dL (ref 8.0–10.3)
Chloride: 103 mEq/L (ref 98–108)
Creat: 0.8 mg/dl (ref 0.6–1.2)
Glucose, Bld: 87 mg/dL (ref 73–118)

## 2010-08-06 LAB — CBC WITH DIFFERENTIAL/PLATELET
EOS%: 4.3 % (ref 0.0–7.0)
MCH: 33 pg (ref 25.1–34.0)
MCV: 98.4 fL (ref 79.5–101.0)
MONO%: 5.2 % (ref 0.0–14.0)
RBC: 3.92 10*6/uL (ref 3.70–5.45)
RDW: 13.6 % (ref 11.2–14.5)

## 2010-08-06 LAB — LACTATE DEHYDROGENASE: LDH: 142 U/L (ref 94–250)

## 2010-08-06 MED ORDER — IOHEXOL 300 MG/ML  SOLN
100.0000 mL | Freq: Once | INTRAMUSCULAR | Status: AC | PRN
  Administered 2010-08-06: 100 mL via INTRAVENOUS

## 2010-08-08 ENCOUNTER — Encounter (HOSPITAL_BASED_OUTPATIENT_CLINIC_OR_DEPARTMENT_OTHER): Payer: Medicare Other | Admitting: Hematology and Oncology

## 2010-08-08 DIAGNOSIS — C494 Malignant neoplasm of connective and soft tissue of abdomen: Secondary | ICD-10-CM

## 2010-09-14 DIAGNOSIS — J309 Allergic rhinitis, unspecified: Secondary | ICD-10-CM | POA: Insufficient documentation

## 2010-10-16 LAB — GLUCOSE, CAPILLARY: Glucose-Capillary: 89

## 2010-10-17 LAB — COMPREHENSIVE METABOLIC PANEL
ALT: 17
AST: 22
Albumin: 3 — ABNORMAL LOW
CO2: 28
Calcium: 9.7
Chloride: 110
Creatinine, Ser: 0.82
GFR calc Af Amer: >60
Sodium: 144

## 2010-10-17 LAB — CBC
HCT: 32.4 — ABNORMAL LOW
Hemoglobin: 10.7 — ABNORMAL LOW
MCHC: 33.1
MCV: 91.4
MCV: 94.1
Platelets: 329
Platelets: 540 — ABNORMAL HIGH
RBC: 3.35 — ABNORMAL LOW
RDW: 16.1 — ABNORMAL HIGH
WBC: 7.1
WBC: 8.2

## 2010-10-17 LAB — TYPE AND SCREEN

## 2010-10-17 LAB — BASIC METABOLIC PANEL
BUN: 3 — ABNORMAL LOW
BUN: 3 — ABNORMAL LOW
CO2: 27
Calcium: 8.4
Chloride: 109
Creatinine, Ser: 0.67
GFR calc non Af Amer: >60
GFR calc non Af Amer: >60
Glucose, Bld: 124 — ABNORMAL HIGH
Glucose, Bld: 157 — ABNORMAL HIGH
Potassium: 4.7
Sodium: 141

## 2010-10-17 LAB — DIFFERENTIAL
Basophils Absolute: 0
Basophils Relative: 1
Eosinophils Relative: 1
Monocytes Absolute: 0.3
Neutro Abs: 6.6

## 2010-10-17 LAB — RAPID STREP SCREEN (MED CTR MEBANE ONLY): Streptococcus, Group A Screen (Direct): NEGATIVE

## 2010-10-19 LAB — CBC
Hemoglobin: 11.5 g/dL — ABNORMAL LOW (ref 12.0–15.0)
MCHC: 33.9 g/dL (ref 30.0–36.0)
MCHC: 34 g/dL (ref 30.0–36.0)
Platelets: 176 10*3/uL (ref 150–400)
RBC: 3.67 MIL/uL — ABNORMAL LOW (ref 3.87–5.11)
RDW: 17.9 % — ABNORMAL HIGH (ref 11.5–15.5)
WBC: 1.4 10*3/uL — CL (ref 4.0–10.5)

## 2010-10-19 LAB — DIFFERENTIAL
Band Neutrophils: 0 % (ref 0–10)
Basophils Absolute: 0 10*3/uL (ref 0.0–0.1)
Blasts: 0 %
Lymphocytes Relative: 57 % — ABNORMAL HIGH (ref 12–46)
Lymphs Abs: 1.9 10*3/uL (ref 0.7–4.0)
Myelocytes: 0 %
Neutro Abs: 0.4 10*3/uL — ABNORMAL LOW (ref 1.7–7.7)
Neutrophils Relative %: 11 % — ABNORMAL LOW (ref 43–77)
Promyelocytes Absolute: 0 %

## 2010-10-19 LAB — BASIC METABOLIC PANEL
BUN: 12 mg/dL (ref 6–23)
CO2: 23 mEq/L (ref 19–32)
Calcium: 8.6 mg/dL (ref 8.4–10.5)
GFR calc non Af Amer: >60 mL/min (ref >60)
Glucose, Bld: 107 mg/dL — ABNORMAL HIGH (ref 70–99)

## 2010-10-19 LAB — CULTURE, BLOOD (ROUTINE X 2)
Culture: NO GROWTH
Culture: NO GROWTH

## 2010-10-19 LAB — SEDIMENTATION RATE: Sed Rate: 4 mm/hr (ref 0–22)

## 2010-10-19 LAB — COMPREHENSIVE METABOLIC PANEL
ALT: 19 U/L (ref 0–35)
AST: 25 U/L (ref 0–37)
Albumin: 3.1 g/dL — ABNORMAL LOW (ref 3.5–5.2)
Alkaline Phosphatase: 63 U/L (ref 39–117)
Chloride: 114 mEq/L — ABNORMAL HIGH (ref 96–112)
GFR calc Af Amer: >60 mL/min (ref >60)
Potassium: 3.2 mEq/L — ABNORMAL LOW (ref 3.5–5.1)
Sodium: 140 mEq/L (ref 135–145)
Total Bilirubin: 0.6 mg/dL (ref 0.3–1.2)

## 2010-12-05 ENCOUNTER — Inpatient Hospital Stay (HOSPITAL_COMMUNITY)
Admission: AD | Admit: 2010-12-05 | Discharge: 2010-12-09 | DRG: 390 | Disposition: A | Discharge status: Home / Self Care | Payer: Medicare Other | Source: Other Acute Inpatient Hospital | Attending: Surgery | Admitting: Surgery

## 2010-12-05 DIAGNOSIS — I10 Essential (primary) hypertension: Secondary | ICD-10-CM | POA: Diagnosis present

## 2010-12-05 DIAGNOSIS — K565 Intestinal adhesions [bands], unspecified as to partial versus complete obstruction: Principal | ICD-10-CM | POA: Diagnosis present

## 2010-12-05 DIAGNOSIS — E739 Lactose intolerance, unspecified: Secondary | ICD-10-CM | POA: Diagnosis present

## 2010-12-05 DIAGNOSIS — K56600 Partial intestinal obstruction, unspecified as to cause: Secondary | ICD-10-CM | POA: Diagnosis present

## 2010-12-05 DIAGNOSIS — F411 Generalized anxiety disorder: Secondary | ICD-10-CM | POA: Diagnosis present

## 2010-12-05 DIAGNOSIS — K56609 Unspecified intestinal obstruction, unspecified as to partial versus complete obstruction: Secondary | ICD-10-CM

## 2010-12-05 DIAGNOSIS — Z9049 Acquired absence of other specified parts of digestive tract: Secondary | ICD-10-CM

## 2010-12-05 DIAGNOSIS — R109 Unspecified abdominal pain: Secondary | ICD-10-CM

## 2010-12-05 DIAGNOSIS — Z8509 Personal history of malignant neoplasm of other digestive organs: Secondary | ICD-10-CM

## 2010-12-05 DIAGNOSIS — C49A3 Gastrointestinal stromal tumor of small intestine: Secondary | ICD-10-CM

## 2010-12-05 DIAGNOSIS — E7800 Pure hypercholesterolemia, unspecified: Secondary | ICD-10-CM | POA: Diagnosis present

## 2010-12-05 HISTORY — DX: Essential (primary) hypertension: I10

## 2010-12-05 HISTORY — DX: Anxiety disorder, unspecified: F41.9

## 2010-12-05 LAB — CREATININE, SERUM
Creatinine, Ser: 0.87 mg/dL (ref 0.50–1.10)
GFR calc Af Amer: 76 mL/min — ABNORMAL LOW (ref >90)
GFR calc non Af Amer: 66 mL/min — ABNORMAL LOW (ref >90)

## 2010-12-05 LAB — CBC
Hemoglobin: 11.3 g/dL — ABNORMAL LOW (ref 12.0–15.0)
MCH: 32.3 pg (ref 26.0–34.0)
Platelets: 303 10*3/uL (ref 150–400)
RBC: 3.5 MIL/uL — ABNORMAL LOW (ref 3.87–5.11)
WBC: 5.2 10*3/uL (ref 4.0–10.5)

## 2010-12-05 MED ORDER — ALPRAZOLAM 0.5 MG PO TABS
ORAL_TABLET | ORAL | Status: AC
  Administered 2010-12-06: 0.5 mg via ORAL
  Filled 2010-12-05: qty 1

## 2010-12-05 MED ORDER — PANTOPRAZOLE SODIUM 40 MG IV SOLR
40.0000 mg | Freq: Every day | INTRAVENOUS | Status: DC
  Administered 2010-12-06 – 2010-12-08 (×3): 40 mg via INTRAVENOUS
  Filled 2010-12-05 (×5): qty 40

## 2010-12-05 MED ORDER — KCL IN DEXTROSE-NACL 20-5-0.45 MEQ/L-%-% IV SOLN
INTRAVENOUS | Status: DC
  Administered 2010-12-06 – 2010-12-08 (×6): via INTRAVENOUS
  Filled 2010-12-05 (×9): qty 1000

## 2010-12-05 MED ORDER — DIPHENHYDRAMINE HCL 12.5 MG/5ML PO ELIX
12.5000 mg | ORAL_SOLUTION | Freq: Four times a day (QID) | ORAL | Status: DC | PRN
  Administered 2010-12-06: 12.5 mg via ORAL
  Filled 2010-12-05: qty 5

## 2010-12-05 MED ORDER — DIPHENHYDRAMINE HCL 50 MG/ML IJ SOLN: 12.5000 mg | Freq: Four times a day (QID) | INTRAMUSCULAR | Status: DC | PRN

## 2010-12-05 MED ORDER — ALPRAZOLAM 0.5 MG PO TABS
0.5000 mg | ORAL_TABLET | Freq: Three times a day (TID) | ORAL | Status: DC | PRN
  Administered 2010-12-06 – 2010-12-09 (×5): 0.5 mg via ORAL
  Filled 2010-12-05 (×5): qty 1

## 2010-12-05 MED ORDER — ONDANSETRON HCL 4 MG/2ML IJ SOLN: 4.0000 mg | Freq: Four times a day (QID) | INTRAMUSCULAR | Status: DC | PRN

## 2010-12-05 MED ORDER — HEPARIN SODIUM (PORCINE) 5000 UNIT/ML IJ SOLN
5000.0000 [IU] | Freq: Three times a day (TID) | INTRAMUSCULAR | Status: DC
  Administered 2010-12-06 – 2010-12-09 (×9): 5000 [IU] via SUBCUTANEOUS
  Filled 2010-12-05 (×14): qty 1

## 2010-12-05 MED ORDER — MORPHINE SULFATE 2 MG/ML IJ SOLN
0.5000 mg | INTRAMUSCULAR | Status: DC | PRN
  Administered 2010-12-06 – 2010-12-08 (×5): 0.5 mg via INTRAVENOUS
  Filled 2010-12-05 (×5): qty 1

## 2010-12-05 NOTE — H&P (Signed)
Chief Complaint:  Lower abdominal pain and partial small bowel obstruction  History of Present Illness:  Tracey [name] is an 70 y.o. female was transferred via [facility] to [facility] after arrangement to transfer patient to Dr. Jacinto Halim service.  She has been admitted at [facility] for over a week with this bowel obstruction with little or no progress.  She lives in [place] and wanted to be back at [facility].  She denies BM or flatus  No past medical history on file.  No past surgical history on file.  Medications Prior to Admission  Medication Dose Route Frequency Provider Last Rate Last Dose  . dextrose 5 % and 0.45 % NaCl with KCl 20 mEq/L infusion   Intravenous Continuous Valarie Merino, MD      . diphenhydrAMINE (BENADRYL) injection 12.5-25 mg  12.5-25 mg Intravenous Q6H PRN Valarie Merino, MD       Or  . diphenhydrAMINE (BENADRYL) 12.5 MG/5ML elixir 12.5-25 mg  12.5-25 mg Oral Q6H PRN Valarie Merino, MD      . heparin injection 5,000 Units  5,000 Units Subcutaneous Q8H Valarie Merino, MD      . morphine 2 MG/ML injection 0.5 mg  0.5 mg Intravenous Q1H PRN Valarie Merino, MD      . ondansetron [facility]) injection 4 mg  4 mg Intravenous Q6H PRN Valarie Merino, MD      . pantoprazole (PROTONIX) injection 40 mg  40 mg Intravenous QHS Valarie Merino, MD       No current outpatient prescriptions on file as of 12/05/2010.   Allergies  Allergen Reactions  . Penicillins Itching   No family history on file. Social History:   does not have a smoking history on file. She does not have any smokeless tobacco history on file. Her alcohol and drug histories not on file. Her husband just had a thrice recurrent liposarcoma resected from the lower abdomen.     REVIEW OF SYSTEMS - PERTINENT POSITIVES ONLY: Positive for taking Gleevec for GIST.  Has seen Dr. Etta Quill.   Physical Exam:   Blood pressure 95/53, pulse 70, temperature 98.3 F (36.8 C), temperature source Oral, resp. rate 20, SpO2 100.00%. There  is no height or weight on file to calculate BMI.  Gen:  No acute distress.  Adequately nourished and well groomed.   Neurological: Alert and oriented to person, place, and time. Coordination normal.  Head: Normocephalic and atraumatic.  Eyes: Conjunctivae are normal. Pupils are equal, round, and reactive to light. No scleral icterus.  Neck: Normal range of motion. Neck supple. No tracheal deviation or thyromegaly present.  Cardiovascular:  SR without murmurs or gallops Respiratory: Effort normal.  No respiratory distress. No chest wall tenderness. Breath sounds normal.  No wheezes, rales or rhonchi.  GI: Soft. Bowel sounds are hypoactive. The abdomen is soft and nontender.  There is no rebound and no guarding. GU: Musculoskeletal: Normal range of motion. Extremities are nontender.  Lymphadenopathy: No cervical, preauricular, postauricular or axillary adenopathy is present Skin: Skin is warm and dry. No rash noted. No diaphoresis. No erythema. No pallor. No clubbing, cyanosis, or edema.  Pscyh: Normal mood and affect. Behavior is normal. Judgment and thought content normal.   LABORATORY RESULTS: Results for orders placed during the hospital encounter of 12/05/10 (from the past 48 hour(s))  CBC     Status: Abnormal   Collection Time   12/05/10 10:35 PM      Component Value Range Comment  WBC 5.2  4.0 - 10.5 (K/uL)    RBC 3.50 (*) 3.87 - 5.11 (MIL/uL)    Hemoglobin 11.3 (*) 12.0 - 15.0 (g/dL)    HCT 16.1 (*) 09.6 - 46.0 (%)    MCV 94.9  78.0 - 100.0 (fL)    MCH 32.3  26.0 - 34.0 (pg)    MCHC 34.0  30.0 - 36.0 (g/dL)    RDW 04.5  40.9 - 81.1 (%)    Platelets 303  150 - 400 (K/uL)     RADIOLOGY RESULTS: No results found.  Problem List: Principal Problem:  *Partial bowel obstruction Active Problems:  GIST (gastrointestinal stromal tumor), malignant treated by Dr. Loree Fee in 2008  Hypertension  Hypercholesterolemia   Assessment & Plan: Partial SBO Continue NPO and  assessment regarding surgery per Dr. Ezzard Standing and Michaell Cowing.  May need central line and TNA if ileus lasts much longer.      Matt B. Daphine Deutscher, MD, [facility] [phone] beeper [phone]  12/05/2010 10:55 PM

## 2010-12-06 ENCOUNTER — Inpatient Hospital Stay (HOSPITAL_COMMUNITY): Payer: Medicare Other

## 2010-12-06 ENCOUNTER — Encounter (HOSPITAL_COMMUNITY): Payer: Self-pay | Admitting: *Deleted

## 2010-12-06 LAB — CBC
HCT: 32.2 % — ABNORMAL LOW (ref 36.0–46.0)
Hemoglobin: 10.6 g/dL — ABNORMAL LOW (ref 12.0–15.0)
MCHC: 32.9 g/dL (ref 30.0–36.0)
MCV: 96.4 fL (ref 78.0–100.0)
RDW: 12.8 % (ref 11.5–15.5)

## 2010-12-06 LAB — COMPREHENSIVE METABOLIC PANEL
ALT: 14 U/L (ref 0–35)
AST: 21 U/L (ref 0–37)
Albumin: 2.8 g/dL — ABNORMAL LOW (ref 3.5–5.2)
Alkaline Phosphatase: 49 U/L (ref 39–117)
Calcium: 9.3 mg/dL (ref 8.4–10.5)
Potassium: 4.5 mEq/L (ref 3.5–5.1)
Sodium: 134 mEq/L — ABNORMAL LOW (ref 135–145)
Total Protein: 5.4 g/dL — ABNORMAL LOW (ref 6.0–8.3)

## 2010-12-06 LAB — PROTIME-INR: Prothrombin Time: 13.8 seconds (ref 11.6–15.2)

## 2010-12-06 MED ORDER — IOHEXOL 300 MG/ML  SOLN
80.0000 mL | Freq: Once | INTRAMUSCULAR | Status: AC | PRN
  Administered 2010-12-06: 80 mL via INTRAVENOUS

## 2010-12-06 MED ORDER — PHENOL 1.4 % MT LIQD
1.0000 | OROMUCOSAL | Status: DC | PRN
  Filled 2010-12-06: qty 177

## 2010-12-06 NOTE — Progress Notes (Signed)
HD # 1 Son, Tracey [name], in room. PCP:  [facility]  Assessment/Plan:   1.Partial bowel obstruction (12/05/2010) Transferred from [facility], Sherlie Ban. KUB - some central SB air, contrast in transverse colon    Assessment: clinically better   Plan: reviewed films from [facility].  CT scan done 11/28/2010 shows mid SB dilatation.  No obvious tumor.  I think a repeat CT scan would give [place] a new baseline.  Discusses with Dr. Elnita Maxwell.   2. GIST (gastrointestinal stromal tumor), malignant treated by Dr. Loree Fee (12/05/2010) She had term ileum/cecal resection 09/22/2007. See Dr. Lysbeth Penner, no know recurrent disease.  CT scan neg from summer 2012.   3. Hypertension (12/05/2010) - x 1 year    4. Hypercholesterolemia (12/05/2010)  5.  Lactose intolerance - [facility] Colonoscopy - 2008  6.  Anxiety x 1 year  7.  VTE - on SQ heparin  8.  Plan PICC for possible TPN.  Subjective:  Feels better, passed small flatus, no BM  Son in room.  Objective:   Filed Vitals:   12/06/10 0622  BP: 98/64  Pulse: 58  Temp: 97.3 F (36.3 C)  Resp: 18     Intake/Output from previous day:  11/21 0701 - 11/22 0700 In: 800 [I.V.:800] Out: 450 [Urine:300; Emesis/NG output:150]  Intake/Output this shift:      Physical Exam:   General:  Has NGT   Lungs: Clear   Abdomen: Soft.  She has scars from prior hep shots.  Pfannenstiel incision.  No hernia.  BS present, but decreased.   Wound: None   Lab Results:    Basename 12/06/10 0440 12/05/10 2235  WBC 3.6* 5.2  HGB 10.6* 11.3*  HCT 32.2* 33.2*  PLT 274 303    BMET   Basename 12/06/10 0440 12/05/10 2235  NA 134* --  K 4.5 --  CL 104 --  CO2 25 --  GLUCOSE 111* --  BUN 8 --  CREATININE 0.84 0.87  CALCIUM 9.3 --    PT/INR   Basename 12/06/10 0440  LABPROT 13.8  INR 1.04    ABG  No results found for this basename: PHART:2,PCO2:2,PO2:2,HCO3:2 in the last 72 hours   Studies/Results:  No results  found.   Anti-infectives:   Anti-infectives    None       LOS: 1 day    [facility] H, MD Pager:  [phone],  [facility] Surgery Office: [phone] 12/06/2010

## 2010-12-07 ENCOUNTER — Other Ambulatory Visit (HOSPITAL_COMMUNITY): Payer: BC Managed Care – PPO

## 2010-12-07 ENCOUNTER — Inpatient Hospital Stay (HOSPITAL_COMMUNITY): Payer: Medicare Other

## 2010-12-07 LAB — CBC
HCT: 32.9 % — ABNORMAL LOW (ref 36.0–46.0)
MCV: 97.3 fL (ref 78.0–100.0)
RBC: 3.38 MIL/uL — ABNORMAL LOW (ref 3.87–5.11)
WBC: 3.2 10*3/uL — ABNORMAL LOW (ref 4.0–10.5)

## 2010-12-07 LAB — BASIC METABOLIC PANEL
CO2: 21 mEq/L (ref 19–32)
Chloride: 109 mEq/L (ref 96–112)
Sodium: 138 mEq/L (ref 135–145)

## 2010-12-07 LAB — DIFFERENTIAL
Basophils Absolute: 0 10*3/uL (ref 0.0–0.1)
Lymphocytes Relative: 33 % (ref 12–46)
Monocytes Absolute: 0.4 10*3/uL (ref 0.1–1.0)
Neutro Abs: 1.6 10*3/uL — ABNORMAL LOW (ref 1.7–7.7)

## 2010-12-07 NOTE — Progress Notes (Signed)
Subjective: She actually looks quite good.  She had 1 BM yesterday PM, and 2 this AM. NG recorded yesterday. I can't really tell how much since MN at this point. She feels better not having abd. Pain. Objective: Vital signs in last 24 hours: Temp:  [97.3 F (36.3 C)-97.8 F (36.6 C)] 97.6 F (36.4 C) (11/23 0515) Pulse Rate:  [50-59] 50  (11/23 0515) Resp:  [18-20] 18  (11/23 0515) BP: (98-128)/(62-76) 98/62 mmHg (11/23 0515) SpO2:  [96 %-100 %] 97 % (11/23 0515) Weight:  [54.432 kg (120 lb)] 120 lb (54.432 kg) (11/23 0414) Last BM Date: 12/06/10  Intake/Output from previous day: 11/22 0701 - 11/23 0700 In: 1396 [I.V.:1376; NG/GT:20] Out: 2650 [Urine:2200; Emesis/NG output:450] Intake/Output this shift: Total I/O In: 884.7 [I.V.:884.7] Out: -   General appearance: alert, cooperative and no distress GI: soft, non-tender; bowel sounds normal; no masses,  no organomegaly and NG drainage green, no pain this AM  Lab Results:   [facility] 12/07/10 0557 12/06/10 0440  WBC 3.2* 3.6*  HGB 10.7* 10.6*  HCT 32.9* 32.2*  PLT 272 274    BMET  Basename 12/07/10 0557 12/06/10 0440  NA 138 134*  K 4.3 4.5  CL 109 104  CO2 21 25  GLUCOSE 120* 111*  BUN 4* 8  CREATININE 0.78 0.84  CALCIUM 9.4 9.3   PT/INR  Basename 12/06/10 0440  LABPROT 13.8  INR 1.04     Studies/Results: Ct Abdomen Pelvis W Contrast  12/06/2010  *RADIOLOGY REPORT*  Clinical Data: A small bowel obstruction.  Previous surgery for pelvic GI stromal tumor.  CT ABDOMEN AND PELVIS WITH CONTRAST  Technique:  Multidetector CT imaging of the abdomen and pelvis was performed following the standard protocol during bolus administration of intravenous contrast.  Contrast: 80mL OMNIPAQUE IOHEXOL 300 MG/ML IV SOLN  Comparison: 08/06/2010  Findings: Moderate dilatation of proximal small bowel loops is seen within the abdomen and pelvis.  There is a transition point in the mid pelvis in the area of several surgical  clips, suspicious for adhesion.  No soft tissue mass or inflammatory process identified. Distal small bowel is nondilated.  Shotty lymph nodes are again seen within the small bowel mesentery and left para-aortic region, without significant change.  No pathologically enlarged lymph nodes are identified.  No evidence of inflammatory process or bowel wall thickening.  A tiny amount of free fluid is noted in the pelvic cul-de-sac.  Previous hysterectomy noted.  A 2 cm benign hemangioma in the dome of the right hepatic lobe is stable.  Other tiny less than 1 cm hepatic cysts are also stable. The other abdominal parenchymal organs are normal in appearance.  IMPRESSION:  1.  Mid small bowel obstruction, with transition point in the central pelvis most likely due to adhesion. 2.  No evidence of recurrent neoplasm or metastatic disease. 3.  Stable benign hepatic hemangioma and tiny hepatic and left renal cysts.  Original Report Authenticated By: Danae Orleans, M.D.   Dg Abd Portable 2v  12/06/2010  *RADIOLOGY REPORT*  Clinical Data: Bowel obstruction  ABDOMEN - 2 VIEW  Comparison: None.  Findings: NG tube tip is in the antrum of the stomach.  There is no free intraperitoneal gas on the left decubitus image.  Contrast, gas, and stool are present in the colon.  No definite disproportionate dilatation of small bowel.  Mildly prominent small bowel loop in the left lower quadrant is noted.  IMPRESSION: NG tube tip in the antrum of  the stomach.  Nonobstructive bowel gas pattern.  No free intraperitoneal gas on the left decubitus image.  Original Report Authenticated By: Donavan Burnet, M.D.    Anti-infectives: Anti-infectives    None     Current Facility-Administered Medications  Medication Dose Route Frequency Provider Last Rate Last Dose  . ALPRAZolam Prudy Feeler) tablet 0.5 mg  0.5 mg Oral TID PRN Valarie Merino, MD   0.5 mg at 12/07/10 0835  . dextrose 5 % and 0.45 % NaCl with KCl 20 mEq/L infusion   Intravenous  Continuous Valarie Merino, MD 100 mL/hr at 12/07/10 0418    . diphenhydrAMINE (BENADRYL) injection 12.5-25 mg  12.5-25 mg Intravenous Q6H PRN Valarie Merino, MD       Or  . diphenhydrAMINE (BENADRYL) 12.5 MG/5ML elixir 12.5-25 mg  12.5-25 mg Oral Q6H PRN Valarie Merino, MD   12.5 mg at 12/06/10 0008  . heparin injection 5,000 Units  5,000 Units Subcutaneous Q8H Valarie Merino, MD   5,000 Units at 12/06/10 2312  . iohexol (OMNIPAQUE) 300 MG/ML injection 80 mL  80 mL Intravenous Once PRN Medication Radiologist   80 mL at 12/06/10 1228  . morphine 2 MG/ML injection 0.5 mg  0.5 mg Intravenous Q1H PRN Valarie Merino, MD   0.5 mg at 12/07/10 0834  . ondansetron (ZOFRAN) injection 4 mg  4 mg Intravenous Q6H PRN Valarie Merino, MD      . pantoprazole (PROTONIX) injection 40 mg  40 mg Intravenous QHS Valarie Merino, MD   40 mg at 12/06/10 2312  . phenol (CHLORASEPTIC) mouth spray 1 spray  1 spray Mouth/Throat PRN Kandis Cocking, MD        Assessment/Plan SBO hospitalized for a week at [facility], for this. Tx back home to [facility], for surgery if needed. Patient Active Problem List  Diagnoses  . GIST (gastrointestinal stromal tumor), malignant treated by Dr. Loree Fee in 2008  . Hypertension  . Hypercholesterolemia  . Partial bowel obstruction  PLAN: 2View abd, film requested.  Will review and then decide on need for surgery.  At this point it appears she has opened up.  [KUB maybe a little better.  Patient has had 2 BM.  Abdomen with minimal distention.  I spent 15 minutes going over options with patient - surgery vs start clr liquids.  Her son is in the room.  We decided to try the NGT out, start clr liquids and see how she does.  If she does well, will advance diet.  If she fails, will need surgery.  Will still place PICC.  DN 12/07/2010]    LOS: 2 days    JENNINGS,WILLARD 12/07/2010

## 2010-12-07 NOTE — Progress Notes (Signed)
Patient tolerating clear liquids at this time. Ng tube had been removed by doctor. No c/o gastric discomfort, is positive for flatus. No c/o pain at this time. Ambulated in hallway with family.

## 2010-12-08 ENCOUNTER — Inpatient Hospital Stay (HOSPITAL_COMMUNITY): Payer: Medicare Other

## 2010-12-08 NOTE — Progress Notes (Signed)
HD # 2  PCP:  Rojelio [facility]  Assessment/Plan:   1.Partial bowel obstruction (12/05/2010) Transferred from [facility], [facility]. KUB (01/07/2011) - some central SB air, overall better, contrast in transverse colon Tolerated clr liquids, to advance diet.   2. GIST (gastrointestinal stromal tumor), malignant treated by Dr. Loree Fee (12/05/2010) She had term ileum/cecal resection 09/22/2007. See Dr. Lysbeth Penner, no know recurrent disease.  3. Hypertension (12/05/2010) - x 1 year    4. Hypercholesterolemia (12/05/2010)  5.  Lactose intolerance - Medoff Colonoscopy - 2008  6.  Anxiety x 1 year  7.  VTE - on SQ heparin  Subjective:  Had 5 BMs.  Tolerated liquids.  Hungry.  Objective:   Filed Vitals:   12/08/10 0809  BP: 99/66  Pulse: 63  Temp: 97.3 F (36.3 C)  Resp: 16     Intake/Output from previous day:  11/23 0701 - 11/24 0700 In: 2537.7 [P.O.:480; I.V.:2057.7] Out: 1100 [Urine:900; Emesis/NG output:200]  Intake/Output this shift:  Total I/O In: 500 [P.O.:500] Out: 1100 [Urine:1100]   Physical Exam:   General:  WNWF, no distress.   Lungs: Clear   Abdomen: Soft.  She has ecchymosis from hep shots.  Pfannenstiel incision.  No hernia.  Mild distention.  Soft.  BS positive.   Wound: None   Lab Results:     Basename 12/07/10 0557 12/06/10 0440  WBC 3.2* 3.6*  HGB 10.7* 10.6*  HCT 32.9* 32.2*  PLT 272 274    BMET    Basename 12/07/10 0557 12/06/10 0440  NA 138 134*  K 4.3 4.5  CL 109 104  CO2 21 25  GLUCOSE 120* 111*  BUN 4* 8  CREATININE 0.78 0.84  CALCIUM 9.4 9.3    PT/INR    Basename 12/06/10 0440  LABPROT 13.8  INR 1.04    ABG  No results found for this basename: PHART:2,PCO2:2,PO2:2,HCO3:2 in the last 72 hours   Studies/Results:  Ct Abdomen Pelvis W Contrast  12/06/2010  *RADIOLOGY REPORT*  Clinical Data: A small bowel obstruction.  Previous surgery for pelvic GI stromal tumor.  CT ABDOMEN AND PELVIS WITH CONTRAST  Technique:   Multidetector CT imaging of the abdomen and pelvis was performed following the standard protocol during bolus administration of intravenous contrast.  Contrast: 80mL OMNIPAQUE IOHEXOL 300 MG/ML IV SOLN  Comparison: 08/06/2010  Findings: Moderate dilatation of proximal small bowel loops is seen within the abdomen and pelvis.  There is a transition point in the mid pelvis in the area of several surgical clips, suspicious for adhesion.  No soft tissue mass or inflammatory process identified. Distal small bowel is nondilated.  Shotty lymph nodes are again seen within the small bowel mesentery and left para-aortic region, without significant change.  No pathologically enlarged lymph nodes are identified.  No evidence of inflammatory process or bowel wall thickening.  A tiny amount of free fluid is noted in the pelvic cul-de-sac.  Previous hysterectomy noted.  A 2 cm benign hemangioma in the dome of the right hepatic lobe is stable.  Other tiny less than 1 cm hepatic cysts are also stable. The other abdominal parenchymal organs are normal in appearance.  IMPRESSION:  1.  Mid small bowel obstruction, with transition point in the central pelvis most likely due to adhesion. 2.  No evidence of recurrent neoplasm or metastatic disease. 3.  Stable benign hepatic hemangioma and tiny hepatic and left renal cysts.  Original Report Authenticated By: Danae Orleans, M.D.   Dg Abd  2 Views  12/07/2010  *RADIOLOGY REPORT*  Clinical Data: Small bowel obstruction.  ABDOMEN - 2 VIEW  Comparison: Radiographs and CT scan dated 12/06/2010  Findings: NG tube tip is in the distal stomach.  Proximal small bowel distention has diminished.  Contrast remains throughout the nondistended colon.  No free air.  Lung bases are clear.  IMPRESSION: Decreased proximal small bowel distention consistent with improving small bowel obstruction.  Original Report Authenticated By: Gwynn Burly, M.D.   Dg Abd Portable 2v  12/08/2010  *RADIOLOGY  REPORT*  Clinical Data: Evaluate small bowel obstruction.  ABDOMEN - 2 VIEW  Comparison: 12/07/2010.  Findings: Retained contrast throughout the colon.  Removal of nasogastric tube.  Gas filled nondilated small bowel loops. Gas filled bowel loops central abdomen have an appearance more suggestive of colon than small bowel.  No free intraperitoneal air.  IMPRESSION: Nonspecific bowel gas pattern without definitive findings of suggest small bowel obstruction.  Original Report Authenticated By: Fuller Canada, M.D.     Anti-infectives:   Anti-infectives    None       LOS: 3 days    Wasil Wolke H, MD Pager:  [phone],  [facility] Office: [phone] 12/08/2010

## 2010-12-09 ENCOUNTER — Encounter (HOSPITAL_COMMUNITY): Payer: Self-pay | Admitting: *Deleted

## 2010-12-09 NOTE — Discharge Summary (Signed)
NAMESAFIYA, GIRDLER               ACCOUNT NO.:  [id number]  MEDICAL RECORD NO.:  [id number]  LOCATION:  1531                         FACILITY:  [facility]  PHYSICIAN:  Sandria Bales. Ezzard Standing, M.D.  DATE OF BIRTH:  07/06/1940  DATE OF ADMISSION:  12/05/2010 DATE OF DISCHARGE:  12/09/2010                              DISCHARGE SUMMARY  DISCHARGE DIAGNOSES: 1. Partial small bowel obstruction, probably secondary to adhesions,     treated conservatively. 2. History of gastrointestinal stromal tumor, status post small bowel     resection on September 22, 2007. 3. Hypertension. 4. Hypercholesterolemia. 5. History of lactose intolerance. 6. Anxiety.  OPERATIONS PERFORMED:  None.  HISTORY OF ILLNESS:  The patient is a 70 year old black female whose husband is being treated for sarcoma at [facility]. She was visiting him, she got sick and started having nausea and vomiting.  She was admitted to the General Surgery service at [facility].  It was thought that she had a bowel obstruction.  She was treated with NG tube and IV fluids.  There was consideration of operating on her, but she lives in [place] and wanted to be back in [place], so Dr. Luretha Murphy accepted her in transfer from Dr. Kerrin Mo on December 05, 2010.  I saw her for the first time on December 06, 2010, and reviewed her history.  She has had a GIST tumor of her small bowel surgically treated by Dr. De Blanch on September 22, 2007.  He resected the segment of small bowel, he resected her terminal ileum and cecum, and did a bilateral oophorectomy.  She then underwent chemotherapy supervised by Dr. Arlan Organ.  She has been disease free.  She had a CT scan this past summer, showed no evidence of recurrent disease before this presentation.  Her other comorbid problems include that she has hypertension, hypercholesterolemia, lactose intolerance, and anxiety in part  due to her husband's complicated sarcoma history.  After reviewing the history, I thought it was worth repeating a CT scan when she arrived here to get a baseline as to the bowel obstruction and to make sure there is no evidence of recurrent tumor.  CT scan showed what appeared to be a narrowing of the small bowel at the brim of the pelvis; however, there was contrast, which had gone through this narrowing, so it was an incomplete or partial small bowel obstruction.  We talked about the options of proceeding with surgery versus observation.  She wanted to try medical observation.  Over the next couple of days, she had increasing number of bowel movements that had become softer.  We removed the NG tube, started her on clear liquids, and then advanced to regular food, which she is now tolerating.  There was no obvious gross tumor on the CT scan obtained here, nor on admission from [facility], so it is presumed this is all scar tissue from her prior small bowel resection.  I recommend no further evaluation unless she remains persistently symptomatic, and we talked about possibly further diagnostic tests such as a small bowel CT enterography.  DISCHARGE CONDITION:  Good.  She can go home on a regular  diet.   She can resume her home medications on discharge.  These home meds include that she has been on Xanax, Klonopin, Benadryl, Zestril, Zocor.  She is to return on a p.r.n. basis with [place].  If she has more symptoms she would need further evaluation by myself, or may be supervised by  Dr. Kinnie Scales.  She also talked about her husband who is being treated for sarcoma and I did offer that our practice would certainly assist in any surgical care he needs as an outpatient to be delivered in  [place]. Dr. Violeta Gelinas took care of him originally for sarcoma, doctor would also be happy to take care of him if necessary.   Sandria Bales. Ezzard Standing, M.D., FACS   DHN/MEDQ  D:   12/09/2010  T:  12/09/2010  Job:  [id number]  cc:   Tracey Harries, M.D. Fax: [phone]  Griffith Citron, M.D. Fax: [phone]  Austin Miles, MD Fax: [phone]  De Blanch, M.D.  Lauretta I Odogwu

## 2010-12-09 NOTE — Discharge Summary (Signed)
D/C dx:  1.Partial bowel obstruction (12/05/2010) - resolved.  Transferred from [facility], [facility].   2. GIST (gastrointestinal stromal tumor), malignant treated by Dr. Loree Fee (12/05/2010)   She had term ileum/cecal resection 09/22/2007.   See Dr. Lysbeth Penner, no know recurrent disease.   3. Hypertension (12/05/2010) - x 1 year  4. Hypercholesterolemia (12/05/2010)  5. Lactose intolerance - [facility] - Colonoscopy - 2008  6. Anxiety x 1 year   See dictated note. #[id number] 12/09/2010  DN

## 2011-01-02 ENCOUNTER — Telehealth: Payer: Self-pay | Admitting: *Deleted

## 2011-01-02 NOTE — Telephone Encounter (Signed)
Hospitalized over Thanksgiving for partial small bowel obstruction. Wanted MD aware of this and that she had CT scans at this time. When does she need to be seen by Dr. Dalene Carrow?

## 2011-01-03 ENCOUNTER — Other Ambulatory Visit: Payer: Self-pay | Admitting: Hematology and Oncology

## 2011-01-03 DIAGNOSIS — C49A0 Gastrointestinal stromal tumor, unspecified site: Secondary | ICD-10-CM

## 2011-01-04 ENCOUNTER — Telehealth: Payer: Self-pay | Admitting: Hematology and Oncology

## 2011-01-04 NOTE — Telephone Encounter (Signed)
Not able to reach pt @ home phone (line busy). Called cell and lmonvm re appts for 3/19 lb/ct and 3/22 f/u. March scheduled mailed today.

## 2011-01-11 ENCOUNTER — Telehealth: Payer: Self-pay | Admitting: *Deleted

## 2011-01-11 ENCOUNTER — Other Ambulatory Visit: Payer: Self-pay | Admitting: *Deleted

## 2011-01-11 NOTE — Telephone Encounter (Signed)
Pt called and wanted to know if she should have CT scan of A/P repeated in March prior to visit  04/05/11.   Pt stated she had same CT scans done in Nov. 2012  For small bowel obstruction while being hospitalized.   Informed pt that scan results will be left on md's desk for review on 01/16/11.    Informed pt that nurse will contact pt next week with md's decision. Pt's    Phone      [phone].

## 2011-01-16 ENCOUNTER — Other Ambulatory Visit: Payer: Self-pay | Admitting: Hematology and Oncology

## 2011-01-16 DIAGNOSIS — C49A0 Gastrointestinal stromal tumor, unspecified site: Secondary | ICD-10-CM

## 2011-01-21 ENCOUNTER — Telehealth: Payer: Self-pay | Admitting: Hematology and Oncology

## 2011-01-21 NOTE — Telephone Encounter (Signed)
Pt lmonvm stating she recently has scans and doesn't think she will need to do it again in march. Message left for nurse to address.

## 2011-01-22 ENCOUNTER — Other Ambulatory Visit: Payer: Self-pay | Admitting: *Deleted

## 2011-03-06 ENCOUNTER — Other Ambulatory Visit (HOSPITAL_COMMUNITY): Payer: Self-pay | Admitting: Family Medicine

## 2011-03-06 DIAGNOSIS — R05 Cough: Secondary | ICD-10-CM

## 2011-03-08 ENCOUNTER — Ambulatory Visit (HOSPITAL_COMMUNITY)
Admission: RE | Admit: 2011-03-08 | Discharge: 2011-03-08 | Disposition: A | Discharge status: Home / Self Care | Payer: Medicare Other | Source: Ambulatory Visit | Attending: Family Medicine | Admitting: Family Medicine

## 2011-03-08 DIAGNOSIS — R059 Cough, unspecified: Secondary | ICD-10-CM | POA: Insufficient documentation

## 2011-03-08 DIAGNOSIS — R05 Cough: Secondary | ICD-10-CM | POA: Insufficient documentation

## 2011-04-01 ENCOUNTER — Other Ambulatory Visit (HOSPITAL_COMMUNITY): Payer: Self-pay | Admitting: Family Medicine

## 2011-04-01 DIAGNOSIS — R05 Cough: Secondary | ICD-10-CM

## 2011-04-02 ENCOUNTER — Ambulatory Visit (HOSPITAL_COMMUNITY)
Admission: RE | Admit: 2011-04-02 | Discharge: 2011-04-02 | Disposition: A | Discharge status: Home / Self Care | Payer: Medicare Other | Source: Ambulatory Visit | Attending: Hematology and Oncology | Admitting: Hematology and Oncology

## 2011-04-02 ENCOUNTER — Other Ambulatory Visit (HOSPITAL_BASED_OUTPATIENT_CLINIC_OR_DEPARTMENT_OTHER): Payer: Medicare Other

## 2011-04-02 DIAGNOSIS — R059 Cough, unspecified: Secondary | ICD-10-CM | POA: Insufficient documentation

## 2011-04-02 DIAGNOSIS — C49A0 Gastrointestinal stromal tumor, unspecified site: Secondary | ICD-10-CM

## 2011-04-02 DIAGNOSIS — R05 Cough: Secondary | ICD-10-CM | POA: Insufficient documentation

## 2011-04-02 DIAGNOSIS — K7689 Other specified diseases of liver: Secondary | ICD-10-CM | POA: Insufficient documentation

## 2011-04-02 DIAGNOSIS — Z9221 Personal history of antineoplastic chemotherapy: Secondary | ICD-10-CM | POA: Insufficient documentation

## 2011-04-02 DIAGNOSIS — C494 Malignant neoplasm of connective and soft tissue of abdomen: Secondary | ICD-10-CM

## 2011-04-02 DIAGNOSIS — K449 Diaphragmatic hernia without obstruction or gangrene: Secondary | ICD-10-CM | POA: Insufficient documentation

## 2011-04-02 DIAGNOSIS — Z90710 Acquired absence of both cervix and uterus: Secondary | ICD-10-CM | POA: Insufficient documentation

## 2011-04-02 LAB — CBC WITH DIFFERENTIAL/PLATELET
Basophils Absolute: 0 10*3/uL (ref 0.0–0.1)
EOS%: 2.7 % (ref 0.0–7.0)
HCT: 37.6 % (ref 34.8–46.6)
HGB: 12.4 g/dL (ref 11.6–15.9)
MCH: 32 pg (ref 25.1–34.0)
MCV: 96.9 fL (ref 79.5–101.0)
MONO%: 5.3 % (ref 0.0–14.0)
NEUT%: 75.9 % (ref 38.4–76.8)
lymph#: 1.1 10*3/uL (ref 0.9–3.3)

## 2011-04-02 LAB — CMP (CANCER CENTER ONLY)
Albumin: 3.6 g/dL (ref 3.3–5.5)
Alkaline Phosphatase: 67 U/L (ref 26–84)
BUN, Bld: 15 mg/dL (ref 7–22)
Glucose, Bld: 88 mg/dL (ref 73–118)
Potassium: 4.1 mEq/L (ref 3.3–4.7)

## 2011-04-02 MED ORDER — IOHEXOL 300 MG/ML  SOLN
100.0000 mL | Freq: Once | INTRAMUSCULAR | Status: AC | PRN
  Administered 2011-04-02: 100 mL via INTRAVENOUS

## 2011-04-03 ENCOUNTER — Other Ambulatory Visit: Payer: Self-pay | Admitting: *Deleted

## 2011-04-05 ENCOUNTER — Telehealth: Payer: Self-pay | Admitting: Hematology and Oncology

## 2011-04-05 ENCOUNTER — Ambulatory Visit: Payer: Medicare Other | Admitting: Hematology and Oncology

## 2011-04-05 ENCOUNTER — Ambulatory Visit (HOSPITAL_BASED_OUTPATIENT_CLINIC_OR_DEPARTMENT_OTHER): Payer: Medicare Other | Admitting: Physician Assistant

## 2011-04-05 VITALS — BP 139/59 | HR 68 | Temp 97.0°F | Ht 62.0 in | Wt 136.1 lb

## 2011-04-05 DIAGNOSIS — Z9079 Acquired absence of other genital organ(s): Secondary | ICD-10-CM

## 2011-04-05 DIAGNOSIS — J069 Acute upper respiratory infection, unspecified: Secondary | ICD-10-CM

## 2011-04-05 DIAGNOSIS — C494 Malignant neoplasm of connective and soft tissue of abdomen: Secondary | ICD-10-CM

## 2011-04-05 DIAGNOSIS — Z8500 Personal history of malignant neoplasm of unspecified digestive organ: Secondary | ICD-10-CM

## 2011-04-05 NOTE — Telephone Encounter (Signed)
Gv pt appt for sept2013 

## 2011-04-05 NOTE — Progress Notes (Signed)
CC:   Griffith Citron, M.D. Tracey Harries, M.D.  IDENTIFYING STATEMENT:  Tracey [name] is a 71 year old white female with GI stromal tumor who presents for followup and to review recent lab and radiographic study results.  INTERIM HISTORY:  [name] reports since her last clinic visit in July 2012 she has had recurrent upper respiratory infections and has been under the care of her primary physician, Dr. Tracey Harries.  She states she has been on a couple of rounds of antibiotic therapy since September of 2012, as well as a  steroid taper but continues to have on ongoing issue with cough. She states she was evaluated by the physician assistant, Zoe Lan at Dr. Bonney Leitz office last week, and at that time, she was told that possibly her symptoms were related to gastroesophageal reflux disease and she was started on a new proton pump inhibitor that was prescribed twice daily.  The patient states that she still needs to pick that prescription up.  She does not remember the name of this medication but she will call our office back to update her records when she picks her prescription up later today.  She states that she has not had any fevers, chills or night sweats and that occasionally the cough is wet sounding and productive but the sputum does not have a color to it.  She does not have any issues with acid reflux and feels that she is eating normally.  She has not had any nausea, vomiting, abdominal pain, constipation, or diarrhea.  No rectal bleeding.  No dysuria, frequency, or hematuria.  No alteration in sensation or balance or swelling of extremities.  Other medications are reviewed and recorded. Also of note, the patient states that she did have a bone density scan as well as a mammogram in September 2012, which she reports are both within normal limits.  She also states that she will be due for colonoscopy later this year and this will be scheduled through Dr. Jennye Boroughs  office.  PHYSICAL EXAMINATION:  Temperature is 97, heart rate 68, respirations 20, blood pressure 139/59, weight 136.1 pounds.  General:  This is a well-developed, well-nourished, white female, in no acute distress. HEENT: Sclerae nonicteric.  There is no oral thrush or mucositis.  Skin: No rashes or lesions.  Lymph nodes:  No  cervical, supraclavicular, axillary, or inguinal lymphadenopathy.  Cardiac:  Reveals regular rate and rhythm without murmurs or gallops.  Peripheral pulses are 2+. Chest:  Lungs are clear to auscultation.  Abdomen: Positive bowel sounds.  Soft, nontender, nondistended.  No organomegaly.  Extremities: No edema cyanosis or calf tenderness.  Neurologic:  Alert, oriented x3. Strength, sensation, and coordination all grossly intact.  LABORATORY DATA:  Laboratory data from April 02, 2011: CBC with differential reveals white blood count of 6.8, hemoglobin 12.4, hematocrit 37.6, platelets 242, ANC of 5.1 and MCV of 96.9.  LDH of 130. Chemistries reveal a sodium of 139, potassium 4.1, chloride 97, BUN of 15, creatinine 0.9, and glucose of 88, alkaline phosphatase 67, AST 20, ALT 16, total protein 6.5, albumin 3.6, and calcium of 9.3.  RADIOGRAPHIC STUDIES:  CT of the chest, abdomen, and pelvis with contrast from April 02, 2011: The chest has no clear evidence of metastatic disease.  Increased linear pleural thickening along the right oblique fissure.  This would be atypical presentation of metastasis. Recommend mention on followup.  New branching nodular pattern in the right lower lobe suggested infectious etiology.  In the abdomen and pelvis,  no evidence of GI stromal tumor recurrence in the abdomen or pelvis, stable hepatic lesions appear benign, stable postsurgical changes in the small bowel.  IMPRESSION/PLAN: 1. TraceyLujuana [name] is a 71 year old white female with GI stromal     tumor who is status post exploratory laparotomy with bilateral      salpingo-oophorectomy, small bowel cecal resection with anastomosis     for a 10 cm GI stromal tumor arising from the proximal ileum.  She     was placed on adjuvant Gleevec from November 03, 2007 through June 27, 2008 and then [facility] was discontinued prematurely primarily     due to GI complaints.  Her most recent labs and radiographic     studies are as above.  These have been reviewed by Dr. Dalene Carrow and     are reviewed with the patient today. 2. Per Dr. Dalene Carrow, the patient will be scheduled for followup in 6     months' time.  The week before this, we will reassess CBC with     differential, CMET and LDH.  She is advised to call in the interim     if any questions or problems. 3. The patient reports recurrent upper respiratory infections managed     by her primary care physician.  She states she was last evaluated     in his office last week and saw his PA and was started on proton     pump inhibitors for possible acid reflux.  She states that Dr.     Everlene Other is also awaiting the results of the above CT scan, and will     possibly be referred her to a pulmonologist for her chronic cough     and recurrent upper respiratory infections.    ______________________________ Michail Sermon, MSN, ANP, BC RH/MEDQ  D:  04/05/2011  T:  04/05/2011  Job:  [id number]

## 2011-04-05 NOTE — Progress Notes (Signed)
This office note has been dictated.

## 2011-07-05 ENCOUNTER — Telehealth: Payer: Self-pay | Admitting: *Deleted

## 2011-07-05 ENCOUNTER — Other Ambulatory Visit: Payer: Self-pay | Admitting: *Deleted

## 2011-07-05 NOTE — Telephone Encounter (Signed)
Received message from Dewayne Hatch, scheduler re:  Pt would like to change f/u appt with md.   Spoke with pt and was informed that pt will be out of town last week of September.   Pt will keep lab appt as scheduled.   Informed pt that md will be notified, and a scheduler will contact pt with new f/u appt.   Pt voiced understanding.

## 2011-07-08 ENCOUNTER — Telehealth: Payer: Self-pay | Admitting: Hematology and Oncology

## 2011-07-08 NOTE — Telephone Encounter (Signed)
called pts home lmovm that appt on 09/24 was moved to 10/04 @ 9am. asked pt to rtn call to confirm appt

## 2011-08-06 ENCOUNTER — Telehealth: Payer: Self-pay | Admitting: Hematology and Oncology

## 2011-08-06 NOTE — Telephone Encounter (Signed)
Returned pt's call re r/s appt. Lm for pt to call back.

## 2011-08-20 DIAGNOSIS — H9190 Unspecified hearing loss, unspecified ear: Secondary | ICD-10-CM | POA: Insufficient documentation

## 2011-08-22 ENCOUNTER — Telehealth: Payer: Self-pay | Admitting: Hematology and Oncology

## 2011-08-22 NOTE — Telephone Encounter (Signed)
S/w pt today re new appts for 10/10 and 10/16. appts r/s from 9/17 and 10/4 per pt - ok per thu.

## 2011-10-01 ENCOUNTER — Other Ambulatory Visit: Payer: Medicare Other

## 2011-10-02 ENCOUNTER — Telehealth: Payer: Self-pay | Admitting: Hematology and Oncology

## 2011-10-02 NOTE — Telephone Encounter (Signed)
Reconfirmed with pt appt 10.15@9am 

## 2011-10-08 ENCOUNTER — Ambulatory Visit: Payer: Medicare Other | Admitting: Hematology and Oncology

## 2011-10-18 ENCOUNTER — Ambulatory Visit: Payer: Medicare Other | Admitting: Hematology and Oncology

## 2011-10-24 ENCOUNTER — Other Ambulatory Visit: Payer: Medicare Other | Admitting: Lab

## 2011-10-29 ENCOUNTER — Other Ambulatory Visit (HOSPITAL_BASED_OUTPATIENT_CLINIC_OR_DEPARTMENT_OTHER): Payer: Medicare Other | Admitting: Lab

## 2011-10-29 ENCOUNTER — Ambulatory Visit (HOSPITAL_BASED_OUTPATIENT_CLINIC_OR_DEPARTMENT_OTHER): Payer: Medicare Other | Admitting: Hematology and Oncology

## 2011-10-29 ENCOUNTER — Encounter: Payer: Self-pay | Admitting: Hematology and Oncology

## 2011-10-29 ENCOUNTER — Telehealth: Payer: Self-pay | Admitting: Hematology and Oncology

## 2011-10-29 VITALS — BP 127/67 | HR 51 | Temp 96.9°F | Resp 20 | Ht 62.0 in | Wt 138.2 lb

## 2011-10-29 DIAGNOSIS — C494 Malignant neoplasm of connective and soft tissue of abdomen: Secondary | ICD-10-CM

## 2011-10-29 DIAGNOSIS — D481 Neoplasm of uncertain behavior of connective and other soft tissue: Secondary | ICD-10-CM

## 2011-10-29 DIAGNOSIS — C49A3 Gastrointestinal stromal tumor of small intestine: Secondary | ICD-10-CM

## 2011-10-29 LAB — CBC WITH DIFFERENTIAL/PLATELET
Basophils Absolute: 0 10*3/uL (ref 0.0–0.1)
Eosinophils Absolute: 0.2 10*3/uL (ref 0.0–0.5)
HGB: 13.3 g/dL (ref 11.6–15.9)
MCV: 97.5 fL (ref 79.5–101.0)
MONO#: 0.3 10*3/uL (ref 0.1–0.9)
MONO%: 6.4 % (ref 0.0–14.0)
NEUT#: 3.4 10*3/uL (ref 1.5–6.5)
RDW: 13.6 % (ref 11.2–14.5)
WBC: 5.3 10*3/uL (ref 3.9–10.3)
lymph#: 1.3 10*3/uL (ref 0.9–3.3)

## 2011-10-29 LAB — LACTATE DEHYDROGENASE (CC13): LDH: 177 U/L (ref 125–220)

## 2011-10-29 LAB — COMPREHENSIVE METABOLIC PANEL (CC13)
BUN: 20 mg/dL (ref 7.0–26.0)
CO2: 23 mEq/L (ref 22–29)
Calcium: 9.7 mg/dL (ref 8.4–10.4)
Chloride: 108 mEq/L — ABNORMAL HIGH (ref 98–107)
Creatinine: 0.9 mg/dL (ref 0.6–1.1)

## 2011-10-29 NOTE — Telephone Encounter (Signed)
appts made and printed for pt aom °

## 2011-10-29 NOTE — Addendum Note (Signed)
Addended by: Normajean Baxter LE on: 10/29/2011 02:30 PM   Modules accepted: Orders

## 2011-10-29 NOTE — Patient Instructions (Addendum)
Tracey [name]  [id number]   [facility] - AFTER VISIT SUMMARY   **RECOMMENDATIONS MADE BY THE CONSULTANT AND ANY TEST    RESULTS WILL BE SENT TO YOUR REFERRING DOCTORS.   YOUR EXAM FINDINGS, LABS AND RESULTS WERE DISCUSSED BY YOUR MD TODAY.  YOU CAN GO TO THE [facility] WEB SITE FOR INSTRUCTIONS ON HOW TO ASSESS MY CHART FOR ADDITIONAL INFORMATION AS NEEDED.  Your Updated drug allergies are: Allergies as of 10/29/2011 - Review Complete 10/29/2011  Allergen Reaction Noted  . Lisinopril Cough 10/29/2011  . Penicillins Itching 12/05/2010    Your current list of medications are: Current Outpatient Prescriptions  Medication Sig Dispense Refill  . aspirin 325 MG tablet Take 325 mg by mouth daily.        . clonazePAM (KLONOPIN) 0.5 MG tablet Take 0.5 mg by mouth 2 (two) times daily as needed. Takes 1/2 tablet by mouth daily as needed       . lithium carbonate 300 MG capsule Take 300 mg by mouth 2 (two) times daily with a meal.        . omeprazole (PRILOSEC) 10 MG capsule Take 10 mg by mouth 2 (two) times daily.      . simvastatin (ZOCOR) 20 MG tablet Take 20 mg by mouth daily.        . diphenhydrAMINE (BENADRYL) 50 MG capsule Take 50 mg by mouth every 6 (six) hours as needed.           INSTRUCTIONS GIVEN AND DISCUSSED:  See attached schedule   SPECIAL INSTRUCTIONS/FOLLOW-UP:  See above.  I acknowledge that I have been informed and understand all the instructions given to me and received a copy.I know to contact the clinic, my physician, or go to the emergency Department if any problems should occur.   I do not have any more questions at this time, but understand that I may call the [facility] at [phone] during business hours should I have any further questions or need assistance in obtaining follow-up care.

## 2011-10-29 NOTE — Progress Notes (Signed)
This office note has been dictated.

## 2011-10-30 ENCOUNTER — Ambulatory Visit: Payer: Medicare Other | Admitting: Hematology and Oncology

## 2011-10-30 NOTE — Progress Notes (Signed)
CC:   Tracey [name], M.D. Tracey [name], M.D.  IDENTIFYING STATEMENT:  The patient is a 71 year old woman with GIST tumor, who presents for followup.  INTERVAL HISTORY:  Tracey [name] was seen 6 months ago.  She has no current concerns.  She is moving her bowels.  She is without nausea, vomiting or abdominal pain.  She has not lost weight.  MEDICATIONS:  Reviewed and updated.  ALLERGIES:  Lisinopril and penicillin.  PHYSICAL EXAMINATION:  The patient is a well-appearing, well-nourished woman in no distress.  Vitals:  Pulse 51, blood pressure 120/67, temperature 96.9, respirations 20, weight 138 pounds.  HEENT:  Head is atraumatic, normocephalic.  Sclerae are anicteric.  Mouth moist.  Chest: Unremarkable.  Abdomen:  Soft, nontender.  No masses.  Bowel sounds present.  Extremities:  No calf tenderness.  LABORATORY DATA:  10/29/2011:  White cell count 5.3, hemoglobin 13.3, hematocrit 40.3, platelets 261.  Sodium 141, potassium 4.5, chloride 108, CO2 23, BUN 20, creatinine 0.9, glucose 93.  T-bili 0.7, alkaline phosphatase 78, AST 22, ALT 15.  IMPRESSION AND PLAN:  Tracey [name] is a 71 year old woman with a gastrointestinal stromal tumor, who is status post exploratory laparotomy with bilateral salpingo-oophorectomy, small bowel resection with anastomosis for a 10 cm gastrointestinal stromal tumor arising from the proximal ileum.  She is status post adjuvant Gleevec from November 03, 2007, through June 27, 2008.  Gleevec was discontinued prematurely primarily due to gastrointestinal side effects.  Her most recent labs are unremarkable.  Her exam is also unremarkable.  She will continue surveillance and follows up in 6 months' time with a CT scan.    ______________________________ Laurice Record, M.D. LIO/MEDQ  D:  10/29/2011  T:  10/30/2011  Job:  [id number]

## 2012-02-15 ENCOUNTER — Encounter: Payer: Self-pay | Admitting: Oncology

## 2012-02-15 ENCOUNTER — Telehealth: Payer: Self-pay | Admitting: Oncology

## 2012-02-15 NOTE — Telephone Encounter (Signed)
Called pt, left message regarding appt with Dr.Sherrill, a former Dr. Dalene Carrow  pt

## 2012-03-06 ENCOUNTER — Telehealth: Payer: Self-pay | Admitting: Oncology

## 2012-03-06 NOTE — Telephone Encounter (Signed)
returned pt called and r/s lab and ct.Marland KitchenMarland KitchenMarland KitchenDone

## 2012-03-30 DIAGNOSIS — I459 Conduction disorder, unspecified: Secondary | ICD-10-CM

## 2012-03-30 HISTORY — DX: Conduction disorder, unspecified: I45.9

## 2012-04-21 ENCOUNTER — Other Ambulatory Visit (HOSPITAL_COMMUNITY): Payer: Medicare Other

## 2012-04-21 ENCOUNTER — Other Ambulatory Visit: Payer: Medicare Other | Admitting: Lab

## 2012-04-24 ENCOUNTER — Ambulatory Visit (HOSPITAL_COMMUNITY)
Admission: RE | Admit: 2012-04-24 | Discharge: 2012-04-24 | Disposition: A | Discharge status: Home / Self Care | Payer: Medicare PPO | Source: Ambulatory Visit | Attending: Hematology and Oncology | Admitting: Hematology and Oncology

## 2012-04-24 ENCOUNTER — Other Ambulatory Visit (HOSPITAL_BASED_OUTPATIENT_CLINIC_OR_DEPARTMENT_OTHER): Payer: Medicare PPO | Admitting: Lab

## 2012-04-24 DIAGNOSIS — R918 Other nonspecific abnormal finding of lung field: Secondary | ICD-10-CM | POA: Insufficient documentation

## 2012-04-24 DIAGNOSIS — K8689 Other specified diseases of pancreas: Secondary | ICD-10-CM | POA: Insufficient documentation

## 2012-04-24 DIAGNOSIS — Z90710 Acquired absence of both cervix and uterus: Secondary | ICD-10-CM | POA: Insufficient documentation

## 2012-04-24 DIAGNOSIS — C49A3 Gastrointestinal stromal tumor of small intestine: Secondary | ICD-10-CM

## 2012-04-24 DIAGNOSIS — R928 Other abnormal and inconclusive findings on diagnostic imaging of breast: Secondary | ICD-10-CM | POA: Insufficient documentation

## 2012-04-24 DIAGNOSIS — M899 Disorder of bone, unspecified: Secondary | ICD-10-CM | POA: Insufficient documentation

## 2012-04-24 DIAGNOSIS — N281 Cyst of kidney, acquired: Secondary | ICD-10-CM | POA: Insufficient documentation

## 2012-04-24 DIAGNOSIS — Z9221 Personal history of antineoplastic chemotherapy: Secondary | ICD-10-CM | POA: Insufficient documentation

## 2012-04-24 DIAGNOSIS — K7689 Other specified diseases of liver: Secondary | ICD-10-CM | POA: Insufficient documentation

## 2012-04-24 DIAGNOSIS — I517 Cardiomegaly: Secondary | ICD-10-CM | POA: Insufficient documentation

## 2012-04-24 DIAGNOSIS — D481 Neoplasm of uncertain behavior of connective and other soft tissue: Secondary | ICD-10-CM

## 2012-04-24 DIAGNOSIS — C494 Malignant neoplasm of connective and soft tissue of abdomen: Secondary | ICD-10-CM | POA: Insufficient documentation

## 2012-04-24 DIAGNOSIS — M949 Disorder of cartilage, unspecified: Secondary | ICD-10-CM | POA: Insufficient documentation

## 2012-04-24 DIAGNOSIS — Z9089 Acquired absence of other organs: Secondary | ICD-10-CM | POA: Insufficient documentation

## 2012-04-24 DIAGNOSIS — Z9049 Acquired absence of other specified parts of digestive tract: Secondary | ICD-10-CM | POA: Insufficient documentation

## 2012-04-24 LAB — CBC WITH DIFFERENTIAL/PLATELET
EOS%: 3.1 % (ref 0.0–7.0)
Eosinophils Absolute: 0.2 10*3/uL (ref 0.0–0.5)
MCV: 96.9 fL (ref 79.5–101.0)
MONO%: 6.7 % (ref 0.0–14.0)
NEUT#: 4 10*3/uL (ref 1.5–6.5)
RBC: 3.98 10*6/uL (ref 3.70–5.45)
RDW: 13.6 % (ref 11.2–14.5)

## 2012-04-24 LAB — COMPREHENSIVE METABOLIC PANEL (CC13)
ALT: 12 U/L (ref 0–55)
AST: 20 U/L (ref 5–34)
Albumin: 3.5 g/dL (ref 3.5–5.0)
Alkaline Phosphatase: 75 U/L (ref 40–150)
Glucose: 85 mg/dl (ref 70–99)
Potassium: 4.4 mEq/L (ref 3.5–5.1)
Sodium: 141 mEq/L (ref 136–145)
Total Protein: 6.7 g/dL (ref 6.4–8.3)

## 2012-04-24 MED ORDER — IOHEXOL 300 MG/ML  SOLN
100.0000 mL | Freq: Once | INTRAMUSCULAR | Status: AC | PRN
  Administered 2012-04-24: 100 mL via INTRAVENOUS

## 2012-04-27 ENCOUNTER — Telehealth: Payer: Self-pay | Admitting: Oncology

## 2012-04-27 ENCOUNTER — Ambulatory Visit (HOSPITAL_BASED_OUTPATIENT_CLINIC_OR_DEPARTMENT_OTHER): Payer: Medicare PPO | Admitting: Oncology

## 2012-04-27 VITALS — BP 126/66 | HR 63 | Temp 96.6°F | Resp 19 | Ht 62.0 in | Wt 137.1 lb

## 2012-04-27 DIAGNOSIS — Z8509 Personal history of malignant neoplasm of other digestive organs: Secondary | ICD-10-CM

## 2012-04-27 DIAGNOSIS — N649 Disorder of breast, unspecified: Secondary | ICD-10-CM

## 2012-04-27 DIAGNOSIS — C49A0 Gastrointestinal stromal tumor, unspecified site: Secondary | ICD-10-CM

## 2012-04-27 NOTE — Telephone Encounter (Signed)
gv pt appt schedule for October.  °

## 2012-04-27 NOTE — Progress Notes (Signed)
   [facility]    OFFICE PROGRESS NOTE   INTERVAL HISTORY:   She returns as scheduled. She has been followed by Dr. Dalene Carrow since being diagnosed with a gastrointestinal stromal tumor in 2009. She discontinued adjuvant Gleevec therapy prematurely secondary to toxicity.  Mrs. Mcclish feels well at present. Her only complaint is arthritis discomfort in the hands. No abdominal pain or difficulty with bowel function.  Objective:  Vital signs in last 24 hours:  Blood pressure 126/66, pulse 63, temperature 96.6 F (35.9 C), temperature source Oral, resp. rate 19, height 5\' 2"  (1.575 m), weight 137 lb 1.6 oz (62.188 kg).    HEENT: Neck without mass Lymphatics: No cervical, supra-clavicular, axillary, or inguinal nodes Resp: Lungs clear bilaterally Cardio: Regular rate and rhythm GI: No hepatosplenomegaly, nontender, no mass, no apparent ascites Vascular: No leg edema   Lab Results:  Lab Results  Component Value Date   WBC 6.2 04/24/2012   HGB 13.0 04/24/2012   HCT 38.6 04/24/2012   MCV 96.9 04/24/2012   PLT 252 04/24/2012   ANC 4.0  X-rays: CT scans of the chest, abdomen, and pelvis on 04/24/2012 compared to 04/02/2011-resolution of pulmonary opacity in the right minor fissure. No findings to suggest pulmonary metastases, mild increase in an apparent cystic lesion in the right breast, resolved right lower lobe infection.    Medications: I have reviewed the patient's current medications.  Assessment/Plan:  1. Gastrointestinal stromal tumor of ileum, status post a small bowel resection in September 2009 for a 10.8 cm, low-grade tumor. She was treated with adjuvant Gleevec from October 2009 through June 2010. Gleevec was discontinued secondary to toxicity.  2. Cystic right breast lesion noted on the staging CT scans 04/24/2012-I gave her a copy of the CT report. She will take this to Dr. Yolanda Bonine  and plans to have a mammogram in the near future  Disposition:  Ms.  Ottaway remains in clinical remission from the gastrointestinal stromal tumor. She is now almost 5 years out from the original diagnosis. I do not recommend further surveillance CT scans. She will return for an office visit in 6 months.   Thornton Papas, MD  04/27/2012  4:02 PM

## 2012-04-28 ENCOUNTER — Ambulatory Visit: Payer: Medicare Other | Admitting: Hematology and Oncology

## 2012-05-21 ENCOUNTER — Encounter: Payer: Self-pay | Admitting: Oncology

## 2012-10-26 ENCOUNTER — Ambulatory Visit: Payer: Medicare PPO | Admitting: Oncology

## 2013-02-19 DIAGNOSIS — K2270 Barrett's esophagus without dysplasia: Secondary | ICD-10-CM | POA: Insufficient documentation

## 2013-02-21 DIAGNOSIS — I10 Essential (primary) hypertension: Secondary | ICD-10-CM | POA: Diagnosis present

## 2013-02-21 DIAGNOSIS — E785 Hyperlipidemia, unspecified: Secondary | ICD-10-CM | POA: Insufficient documentation

## 2013-09-18 ENCOUNTER — Inpatient Hospital Stay (HOSPITAL_COMMUNITY): Payer: Medicare PPO

## 2013-09-18 ENCOUNTER — Encounter (HOSPITAL_BASED_OUTPATIENT_CLINIC_OR_DEPARTMENT_OTHER): Payer: Self-pay | Admitting: Emergency Medicine

## 2013-09-18 ENCOUNTER — Inpatient Hospital Stay (HOSPITAL_BASED_OUTPATIENT_CLINIC_OR_DEPARTMENT_OTHER)
Admission: EM | Admit: 2013-09-18 | Discharge: 2013-09-22 | DRG: 390 | Disposition: A | Discharge status: Home / Self Care | Payer: Medicare PPO | Attending: Surgery | Admitting: Surgery

## 2013-09-18 ENCOUNTER — Emergency Department (HOSPITAL_BASED_OUTPATIENT_CLINIC_OR_DEPARTMENT_OTHER): Payer: Medicare PPO

## 2013-09-18 DIAGNOSIS — H409 Unspecified glaucoma: Secondary | ICD-10-CM

## 2013-09-18 DIAGNOSIS — I10 Essential (primary) hypertension: Secondary | ICD-10-CM | POA: Diagnosis present

## 2013-09-18 DIAGNOSIS — Z860100 Personal history of colon polyps, unspecified: Secondary | ICD-10-CM

## 2013-09-18 DIAGNOSIS — Z8543 Personal history of malignant neoplasm of ovary: Secondary | ICD-10-CM

## 2013-09-18 DIAGNOSIS — K2270 Barrett's esophagus without dysplasia: Secondary | ICD-10-CM | POA: Diagnosis present

## 2013-09-18 DIAGNOSIS — Z79899 Other long term (current) drug therapy: Secondary | ICD-10-CM | POA: Diagnosis not present

## 2013-09-18 DIAGNOSIS — E785 Hyperlipidemia, unspecified: Secondary | ICD-10-CM | POA: Diagnosis present

## 2013-09-18 DIAGNOSIS — H9190 Unspecified hearing loss, unspecified ear: Secondary | ICD-10-CM | POA: Diagnosis present

## 2013-09-18 DIAGNOSIS — Z8509 Personal history of malignant neoplasm of other digestive organs: Secondary | ICD-10-CM | POA: Diagnosis not present

## 2013-09-18 DIAGNOSIS — K565 Intestinal adhesions [bands], unspecified as to partial versus complete obstruction: Principal | ICD-10-CM | POA: Diagnosis present

## 2013-09-18 DIAGNOSIS — Z7982 Long term (current) use of aspirin: Secondary | ICD-10-CM | POA: Diagnosis not present

## 2013-09-18 DIAGNOSIS — F304 Manic episode in full remission: Secondary | ICD-10-CM | POA: Diagnosis present

## 2013-09-18 DIAGNOSIS — K219 Gastro-esophageal reflux disease without esophagitis: Secondary | ICD-10-CM

## 2013-09-18 DIAGNOSIS — R112 Nausea with vomiting, unspecified: Secondary | ICD-10-CM | POA: Diagnosis present

## 2013-09-18 DIAGNOSIS — F411 Generalized anxiety disorder: Secondary | ICD-10-CM | POA: Diagnosis present

## 2013-09-18 DIAGNOSIS — C49A3 Gastrointestinal stromal tumor of small intestine: Secondary | ICD-10-CM | POA: Diagnosis present

## 2013-09-18 DIAGNOSIS — K56609 Unspecified intestinal obstruction, unspecified as to partial versus complete obstruction: Secondary | ICD-10-CM | POA: Diagnosis present

## 2013-09-18 HISTORY — DX: Conduction disorder, unspecified: I45.9

## 2013-09-18 HISTORY — DX: Personal history of colonic polyps: Z86.010

## 2013-09-18 HISTORY — DX: Gastrointestinal stromal tumor of large intestine: C49.A4

## 2013-09-18 HISTORY — DX: Diverticulosis of large intestine without perforation or abscess without bleeding: K57.30

## 2013-09-18 HISTORY — DX: Manic episode in full remission: F30.4

## 2013-09-18 LAB — COMPREHENSIVE METABOLIC PANEL
ALBUMIN: 4.5 g/dL (ref 3.5–5.2)
ALK PHOS: 91 U/L (ref 39–117)
ALT: 13 U/L (ref 0–35)
AST: 24 U/L (ref 0–37)
Anion gap: 19 — ABNORMAL HIGH (ref 5–15)
BUN: 22 mg/dL (ref 6–23)
CO2: 23 mEq/L (ref 19–32)
Calcium: 11.6 mg/dL — ABNORMAL HIGH (ref 8.4–10.5)
Chloride: 96 mEq/L (ref 96–112)
Creatinine, Ser: 0.8 mg/dL (ref 0.50–1.10)
GFR calc Af Amer: 83 mL/min — ABNORMAL LOW (ref >90)
GFR calc non Af Amer: 72 mL/min — ABNORMAL LOW (ref >90)
Glucose, Bld: 130 mg/dL — ABNORMAL HIGH (ref 70–99)
POTASSIUM: 4 meq/L (ref 3.7–5.3)
SODIUM: 138 meq/L (ref 137–147)
TOTAL PROTEIN: 8.1 g/dL (ref 6.0–8.3)
Total Bilirubin: 0.9 mg/dL (ref 0.3–1.2)

## 2013-09-18 LAB — CBC WITH DIFFERENTIAL/PLATELET
BASOS PCT: 0 % (ref 0–1)
Basophils Absolute: 0 10*3/uL (ref 0.0–0.1)
EOS ABS: 0 10*3/uL (ref 0.0–0.7)
Eosinophils Relative: 0 % (ref 0–5)
HEMATOCRIT: 42.9 % (ref 36.0–46.0)
Hemoglobin: 14.1 g/dL (ref 12.0–15.0)
Lymphocytes Relative: 5 % — ABNORMAL LOW (ref 12–46)
Lymphs Abs: 0.6 10*3/uL — ABNORMAL LOW (ref 0.7–4.0)
MCH: 31.9 pg (ref 26.0–34.0)
MCHC: 32.9 g/dL (ref 30.0–36.0)
MCV: 97.1 fL (ref 78.0–100.0)
MONO ABS: 0.6 10*3/uL (ref 0.1–1.0)
MONOS PCT: 5 % (ref 3–12)
Neutro Abs: 10.2 10*3/uL — ABNORMAL HIGH (ref 1.7–7.7)
Neutrophils Relative %: 90 % — ABNORMAL HIGH (ref 43–77)
Platelets: 324 10*3/uL (ref 150–400)
RBC: 4.42 MIL/uL (ref 3.87–5.11)
RDW: 12.4 % (ref 11.5–15.5)
WBC: 11.4 10*3/uL — ABNORMAL HIGH (ref 4.0–10.5)

## 2013-09-18 LAB — LIPASE, BLOOD: Lipase: 22 U/L (ref 11–59)

## 2013-09-18 MED ORDER — LATANOPROST 0.005 % OP SOLN
1.0000 [drp] | Freq: Every day | OPHTHALMIC | Status: DC
  Administered 2013-09-19 – 2013-09-21 (×4): 1 [drp] via OPHTHALMIC
  Filled 2013-09-18: qty 2.5

## 2013-09-18 MED ORDER — LACTATED RINGERS IV BOLUS (SEPSIS): 1000.0000 mL | Freq: Three times a day (TID) | INTRAVENOUS | Status: AC | PRN

## 2013-09-18 MED ORDER — LORAZEPAM 2 MG/ML IJ SOLN
1.0000 mg | Freq: Once | INTRAMUSCULAR | Status: AC
  Administered 2013-09-18: 1 mg via INTRAVENOUS
  Filled 2013-09-18: qty 1

## 2013-09-18 MED ORDER — ONDANSETRON HCL 4 MG/2ML IJ SOLN
4.0000 mg | Freq: Four times a day (QID) | INTRAMUSCULAR | Status: DC | PRN
  Administered 2013-09-18 – 2013-09-19 (×3): 4 mg via INTRAVENOUS
  Filled 2013-09-18 (×3): qty 2

## 2013-09-18 MED ORDER — SODIUM CHLORIDE 0.9 % IV SOLN
INTRAVENOUS | Status: DC
  Administered 2013-09-18: 19:00:00 via INTRAVENOUS

## 2013-09-18 MED ORDER — SODIUM CHLORIDE 0.9 % IV BOLUS (SEPSIS)
500.0000 mL | Freq: Once | INTRAVENOUS | Status: AC
  Administered 2013-09-18: 500 mL via INTRAVENOUS

## 2013-09-18 MED ORDER — KCL IN DEXTROSE-NACL 40-5-0.45 MEQ/L-%-% IV SOLN
INTRAVENOUS | Status: DC
  Administered 2013-09-19 – 2013-09-21 (×9): via INTRAVENOUS
  Administered 2013-09-22: 75 mL/h via INTRAVENOUS
  Filled 2013-09-18 (×11): qty 1000

## 2013-09-18 MED ORDER — IOHEXOL 300 MG/ML  SOLN
50.0000 mL | Freq: Once | INTRAMUSCULAR | Status: AC | PRN
  Administered 2013-09-18: 50 mL via ORAL

## 2013-09-18 MED ORDER — LIP MEDEX EX OINT
1.0000 "application " | TOPICAL_OINTMENT | Freq: Two times a day (BID) | CUTANEOUS | Status: DC
  Administered 2013-09-19 – 2013-09-22 (×7): 1 via TOPICAL
  Filled 2013-09-18: qty 7

## 2013-09-18 MED ORDER — HALOPERIDOL LACTATE 5 MG/ML IJ SOLN: 2.0000 mg | Freq: Four times a day (QID) | INTRAMUSCULAR | Status: DC | PRN

## 2013-09-18 MED ORDER — PANTOPRAZOLE SODIUM 40 MG IV SOLR
40.0000 mg | Freq: Two times a day (BID) | INTRAVENOUS | Status: DC
  Administered 2013-09-19 – 2013-09-20 (×5): 40 mg via INTRAVENOUS
  Filled 2013-09-18 (×7): qty 40

## 2013-09-18 MED ORDER — METOPROLOL TARTRATE 1 MG/ML IV SOLN
5.0000 mg | Freq: Four times a day (QID) | INTRAVENOUS | Status: DC | PRN
  Filled 2013-09-18: qty 5

## 2013-09-18 MED ORDER — DORZOLAMIDE HCL 2 % OP SOLN
1.0000 [drp] | Freq: Two times a day (BID) | OPHTHALMIC | Status: DC
  Administered 2013-09-19 – 2013-09-22 (×7): 1 [drp] via OPHTHALMIC
  Filled 2013-09-18: qty 10

## 2013-09-18 MED ORDER — DIPHENHYDRAMINE HCL 50 MG/ML IJ SOLN
12.5000 mg | Freq: Four times a day (QID) | INTRAMUSCULAR | Status: DC | PRN
  Administered 2013-09-19 – 2013-09-22 (×6): 25 mg via INTRAVENOUS
  Filled 2013-09-18 (×6): qty 1

## 2013-09-18 MED ORDER — ACETAMINOPHEN 650 MG RE SUPP: 650.0000 mg | Freq: Four times a day (QID) | RECTAL | Status: DC | PRN

## 2013-09-18 MED ORDER — HYDROMORPHONE HCL PF 1 MG/ML IJ SOLN
0.5000 mg | INTRAMUSCULAR | Status: DC | PRN
  Administered 2013-09-18: 2 mg via INTRAVENOUS
  Administered 2013-09-19 – 2013-09-20 (×6): 1 mg via INTRAVENOUS
  Administered 2013-09-20: 0.5 mg via INTRAVENOUS
  Administered 2013-09-20 – 2013-09-22 (×7): 1 mg via INTRAVENOUS
  Filled 2013-09-18: qty 2
  Filled 2013-09-18 (×14): qty 1

## 2013-09-18 MED ORDER — ALUM & MAG HYDROXIDE-SIMETH 200-200-20 MG/5ML PO SUSP: 30.0000 mL | Freq: Four times a day (QID) | ORAL | Status: DC | PRN

## 2013-09-18 MED ORDER — ONDANSETRON HCL 4 MG/2ML IJ SOLN
4.0000 mg | Freq: Once | INTRAMUSCULAR | Status: AC
  Administered 2013-09-18: 4 mg via INTRAVENOUS
  Filled 2013-09-18: qty 2

## 2013-09-18 MED ORDER — HEPARIN SODIUM (PORCINE) 5000 UNIT/ML IJ SOLN
5000.0000 [IU] | Freq: Three times a day (TID) | INTRAMUSCULAR | Status: DC
  Administered 2013-09-19 – 2013-09-22 (×11): 5000 [IU] via SUBCUTANEOUS
  Filled 2013-09-18 (×13): qty 1

## 2013-09-18 MED ORDER — IPRATROPIUM BROMIDE 0.03 % NA SOLN
2.0000 | Freq: Every day | NASAL | Status: DC
  Administered 2013-09-19: 2 via NASAL
  Filled 2013-09-18: qty 30

## 2013-09-18 MED ORDER — LACTATED RINGERS IV BOLUS (SEPSIS): 1000.0000 mL | Freq: Once | INTRAVENOUS | Status: DC

## 2013-09-18 MED ORDER — HYDROMORPHONE HCL PF 1 MG/ML IJ SOLN
0.5000 mg | Freq: Once | INTRAMUSCULAR | Status: AC
  Administered 2013-09-18: 0.5 mg via INTRAVENOUS
  Filled 2013-09-18: qty 1

## 2013-09-18 MED ORDER — MENTHOL 3 MG MT LOZG
1.0000 | LOZENGE | OROMUCOSAL | Status: DC | PRN
  Filled 2013-09-18: qty 9

## 2013-09-18 MED ORDER — FENTANYL CITRATE 0.05 MG/ML IJ SOLN: 25.0000 ug | INTRAMUSCULAR | Status: DC | PRN

## 2013-09-18 MED ORDER — BISACODYL 10 MG RE SUPP
10.0000 mg | Freq: Every day | RECTAL | Status: DC
  Administered 2013-09-19 – 2013-09-22 (×3): 10 mg via RECTAL
  Filled 2013-09-18 (×4): qty 1

## 2013-09-18 MED ORDER — PROMETHAZINE HCL 25 MG/ML IJ SOLN
6.2500 mg | INTRAMUSCULAR | Status: DC | PRN
  Filled 2013-09-18: qty 1

## 2013-09-18 MED ORDER — MAGIC MOUTHWASH
15.0000 mL | Freq: Four times a day (QID) | ORAL | Status: DC | PRN
  Filled 2013-09-18: qty 15

## 2013-09-18 MED ORDER — IOHEXOL 300 MG/ML  SOLN
100.0000 mL | Freq: Once | INTRAMUSCULAR | Status: AC | PRN
  Administered 2013-09-18: 100 mL via INTRAVENOUS

## 2013-09-18 MED ORDER — DEXTROSE 5 % IV SOLN: 500.0000 mg | Freq: Four times a day (QID) | INTRAVENOUS | Status: DC | PRN

## 2013-09-18 MED ORDER — PHENOL 1.4 % MT LIQD: 2.0000 | OROMUCOSAL | Status: DC | PRN

## 2013-09-18 MED ORDER — LORAZEPAM 2 MG/ML IJ SOLN
0.5000 mg | Freq: Three times a day (TID) | INTRAMUSCULAR | Status: DC | PRN
  Administered 2013-09-19: 0.5 mg via INTRAVENOUS
  Administered 2013-09-19: 1 mg via INTRAVENOUS
  Administered 2013-09-21: 0.5 mg via INTRAVENOUS
  Filled 2013-09-18 (×3): qty 1

## 2013-09-18 NOTE — ED Provider Notes (Signed)
10:41 PM patient transferred from [facility] with a small bowel obstruction. She has a history of a gastrointestinal tumor this is been removed and she is now 5 years status post cancer. She also has a history of hypertension and hyperlipidemia but he seemed to be well-controlled. At this time her abdomen hurts but is significantly improved and she's not had any vomiting since presenting to the outside ED. Surgery has been consulted   11:20 PM Surgery to admit.  Ephraim Hamburger, MD 09/18/13 [phone]

## 2013-09-18 NOTE — ED Provider Notes (Addendum)
CSN: [id number]     Arrival date & time 09/18/13  1735 History   First MD Initiated Contact with Patient 09/18/13 1737   This chart was scribed for Tracey Sorrow, MD by Edison Simon, ED Scribe. This patient was seen in room MH07/MH07 and the patient's care was started at 5:59 PM.    Chief Complaint  Patient presents with  . Emesis  . Abdominal Pain   Patient is a 73 y.o. female presenting with abdominal pain. The history is provided by the patient. No language interpreter was used.  Abdominal Pain Pain location:  Generalized Pain quality: sharp   Pain radiates to:  Does not radiate Pain severity:  Moderate Onset quality:  Sudden Duration:  1 day Timing:  Constant Chronicity:  Recurrent Relieved by:  Nothing Exacerbated by: lying flat. Ineffective treatments: stool softener, Pepto Bismol. Associated symptoms: diarrhea, nausea and vomiting   Associated symptoms: no chest pain, no chills, no cough, no dysuria, no fever, no shortness of breath and no sore throat   Risk factors: being elderly     HPI Comments: Tracey [name] January is a 73 y.o. female who presents to the Emergency Department complaining of diffuse abdominal pain with onset yesterday around noon. She reports associated vomiting twice and reports emesis is black in color. She reports history of a bowel obstruction 2-3 years ago that corrected itself. She states the pain is constant and sharp at first, which was similar to prior pain from the bowel obstruction, but states it has decreased in intensity. She states the pain does not radiate. She reports the pain is worse when laying down. She reports associated diarrhea, rhinorrhea, congestion, and nausea. She denies abnormal darkness or bloodiness to stool. She reports taking a stool softener recently, thinking she might be having another bowel obstruction. She also reports taking Pepto Bismol today.    Past Medical History  Diagnosis Date  . Hypertension   . Anxiety   . GIST  (gastrointestinal stroma tumor), malignant, colon    Past Surgical History  Procedure Laterality Date  . Abdominal hysterectomy    . Abdominal surgery      tumor removal   No family history on file. History  Substance Use Topics  . Smoking status: Never Smoker   . Smokeless tobacco: Not on file  . Alcohol Use: No   OB History   Grav Para Term Preterm Abortions TAB SAB Ect Mult Living                 Review of Systems  Constitutional: Negative for fever and chills.  HENT: Positive for congestion and rhinorrhea. Negative for sore throat.   Eyes: Negative for visual disturbance.  Respiratory: Negative for cough and shortness of breath.   Cardiovascular: Negative for chest pain and leg swelling.  Gastrointestinal: Positive for nausea, vomiting, abdominal pain and diarrhea.  Genitourinary: Negative for dysuria.  Musculoskeletal: Negative for back pain, joint swelling and neck pain.  Skin: Negative for rash.  Neurological: Negative for headaches.  Hematological: Does not bruise/bleed easily.  Psychiatric/Behavioral: Negative for confusion.      Allergies  Lisinopril and Penicillins  Home Medications   Prior to Admission medications   Medication Sig Start Date End Date Taking? Authorizing Provider  ALPRAZolam Duanne Moron) 0.5 MG tablet Take 0.5 mg by mouth as needed.    Historical Provider, MD  aspirin 325 MG tablet Take 325 mg by mouth daily.      Historical Provider, MD  dorzolamide (TRUSOPT) 2 %  ophthalmic solution Place 1 drop into the right eye 2 (two) times daily.    Historical Provider, MD  fexofenadine (ALLEGRA) 180 MG tablet Take 180 mg by mouth daily.    Historical Provider, MD  latanoprost (XALATAN) 0.005 % ophthalmic solution Place 1 drop into both eyes at bedtime.    Historical Provider, MD  lithium carbonate 300 MG capsule Take 300 mg by mouth 2 (two) times daily with a meal.      Historical Provider, MD  losartan (COZAAR) 25 MG tablet Take 25 mg by mouth daily.     Historical Provider, MD  Multiple Vitamin (MULTIVITAMIN) tablet Take 1 tablet by mouth daily.    Historical Provider, MD  omeprazole (PRILOSEC) 10 MG capsule Take 10 mg by mouth 2 (two) times daily.    Historical Provider, MD  simvastatin (ZOCOR) 20 MG tablet Take 20 mg by mouth daily.      Historical Provider, MD   BP 136/65  Pulse 70  Temp(Src) 97.8 F (36.6 C) (Oral)  Resp 16  Ht 5\' 2"  (1.575 m)  Wt 125 lb (56.7 kg)  BMI 22.86 kg/m2  SpO2 100% Physical Exam  Nursing note and vitals reviewed. Constitutional: She is oriented to person, place, and time. She appears well-developed and well-nourished.  HENT:  Head: Normocephalic and atraumatic.  Eyes: Conjunctivae are normal.  Neck: Normal range of motion. Neck supple.  Cardiovascular: Normal rate and regular rhythm.   No murmur heard. Pulmonary/Chest: Effort normal and breath sounds normal. No respiratory distress. She has no wheezes. She has no rales.  Abdominal: There is no tenderness.  Bowel sounds decreased but present  Musculoskeletal: Normal range of motion. She exhibits no edema.  Neurological: She is alert and oriented to person, place, and time. No cranial nerve deficit. She exhibits normal muscle tone. Coordination normal.  Skin: Skin is warm and dry.  Psychiatric: She has a normal mood and affect.    ED Course  Procedures (including critical care time) Labs Review Labs Reviewed  COMPREHENSIVE METABOLIC PANEL - Abnormal; Notable for the following:    Glucose, Bld 130 (*)    Calcium 11.6 (*)    GFR calc non Af Amer 72 (*)    GFR calc Af Amer 83 (*)    Anion gap 19 (*)    All other components within normal limits  CBC WITH DIFFERENTIAL - Abnormal; Notable for the following:    WBC 11.4 (*)    Neutrophils Relative % 90 (*)    Neutro Abs 10.2 (*)    Lymphocytes Relative 5 (*)    Lymphs Abs 0.6 (*)    All other components within normal limits  LIPASE, BLOOD   Results for orders placed during the hospital  encounter of 09/18/13  COMPREHENSIVE METABOLIC PANEL      Result Value Ref Range   Sodium 138  137 - 147 mEq/L   Potassium 4.0  3.7 - 5.3 mEq/L   Chloride 96  96 - 112 mEq/L   CO2 23  19 - 32 mEq/L   Glucose, Bld 130 (*) 70 - 99 mg/dL   BUN 22  6 - 23 mg/dL   Creatinine, Ser 0.80  0.50 - 1.10 mg/dL   Calcium 11.6 (*) 8.4 - 10.5 mg/dL   Total Protein 8.1  6.0 - 8.3 g/dL   Albumin 4.5  3.5 - 5.2 g/dL   AST 24  0 - 37 U/L   ALT 13  0 - 35 U/L   Alkaline  Phosphatase 91  39 - 117 U/L   Total Bilirubin 0.9  0.3 - 1.2 mg/dL   GFR calc non Af Amer 72 (*) >90 mL/min   GFR calc Af Amer 83 (*) >90 mL/min   Anion gap 19 (*) 5 - 15  LIPASE, BLOOD      Result Value Ref Range   Lipase 22  11 - 59 U/L  CBC WITH DIFFERENTIAL      Result Value Ref Range   WBC 11.4 (*) 4.0 - 10.5 K/uL   RBC 4.42  3.87 - 5.11 MIL/uL   Hemoglobin 14.1  12.0 - 15.0 g/dL   HCT 42.9  36.0 - 46.0 %   MCV 97.1  78.0 - 100.0 fL   MCH 31.9  26.0 - 34.0 pg   MCHC 32.9  30.0 - 36.0 g/dL   RDW 12.4  11.5 - 15.5 %   Platelets 324  150 - 400 K/uL   Neutrophils Relative % 90 (*) 43 - 77 %   Neutro Abs 10.2 (*) 1.7 - 7.7 K/uL   Lymphocytes Relative 5 (*) 12 - 46 %   Lymphs Abs 0.6 (*) 0.7 - 4.0 K/uL   Monocytes Relative 5  3 - 12 %   Monocytes Absolute 0.6  0.1 - 1.0 K/uL   Eosinophils Relative 0  0 - 5 %   Eosinophils Absolute 0.0  0.0 - 0.7 K/uL   Basophils Relative 0  0 - 1 %   Basophils Absolute 0.0  0.0 - 0.1 K/uL     Imaging Review Ct Abdomen Pelvis W Contrast  09/18/2013   CLINICAL DATA:  Abdominal pain and distention. Coffee-ground emesis. Previous small bowel malignant GIST treated with surgery and chemotherapy.  EXAM: CT ABDOMEN AND PELVIS WITH CONTRAST  TECHNIQUE: Multidetector CT imaging of the abdomen and pelvis was performed using the standard protocol following bolus administration of intravenous contrast.  CONTRAST:  123mL OMNIPAQUE IOHEXOL 300 MG/ML  SOLN  COMPARISON:  04/24/2012  FINDINGS: Liver: 1.7  cm hypervascular lesion in the dome of the liver stable common consistent with a benign etiology such as a flash filling hemangioma. Other tiny fluid attenuation hepatic cysts are noted an are also stable.  Gallbladder/Biliary:  Unremarkable.  Pancreas: No mass, inflammatory changes, or other parenchymal abnormality identified.  Spleen:  Within normal limits in size and appearance.  Adrenal Glands:  No mass identified.  Kidneys/Urinary Tract: No masses identified. No evidence of hydronephrosis. A few tiny renal cysts are noted.  Lymph Nodes:  No pathologically enlarged lymph nodes identified.  Pelvic/Reproductive Organs: Prior hysterectomy noted. Adnexal regions are unremarkable in appearance.  Bowel/Peritoneum: Small hiatal hernia noted. Moderate dilatation of small bowel seen the left abdomen pelvis, with transition point in the left pelvis near site of surgical clips. Remainder of small bowel is nondilated. This consistent with a high-grade small bowel obstruction, likely due to adhesion. Small amount of free fluid is seen in the pelvic cul-de-sac, however there is no evidence of abscess or free air. No soft tissue masses are identified.  Vascular:  No evidence of abdominal aortic aneurysm.  Musculoskeletal:  No suspicious bone lesions identified.  Other: Right breast soft tissue mass is seen measuring 2.0 cm on image 2 compared to 1.6 cm previously. Breast carcinoma cannot be excluded.  IMPRESSION: Small bowel obstruction with transition point in the left pelvis near surgical clips, likely due to adhesion.  No definite evidence of recurrent malignancy or metastatic disease within the abdomen or pelvis.  Electronically Signed   By: Earle Gell M.D.   On: 09/18/2013 20:28     EKG Interpretation None     DIAGNOSTIC STUDIES: Oxygen Saturation is 99% on room air, normal by my interpretation.    COORDINATION OF CARE:    MDM   Final diagnoses:  SBO (small bowel obstruction)   Patient overall feeling  better. No vomiting here. Patient's episode of black vomit was probably due to taking Pepto-Bismol today. There's been no coffee-ground emesis has been no melena or black stools or any blood in the stools. Has been no bright blood in the emesis. CT scan shows small bowel obstruction with a transition point in the left pelvis near surgical clips most likely due to adhesions. Patient said small bowel traction in the past last time was approximately about a year ago it resolved with bowel rest. [facility]. Will contact Gen. surgery and internal medicine for admission.  Discussed with Dr. gross from general surgery at [facility]. He will see the patient in the emergency department patient will be transported by [facility] to the emergency department. ED physician on duty notified.   I personally performed the services described in this documentation, which was scribed in my presence. The recorded information has been reviewed and is accurate.      Tracey Sorrow, MD 09/18/13 1025  Tracey Sorrow, MD 09/18/13 2118

## 2013-09-18 NOTE — ED Notes (Signed)
First cup of oral contrast given at 6:40PM.  Patient was instructed to complete by 6:55PM.  RN wanted to give patient nausea medicine before patient started drinking contrast.  Eulas Post

## 2013-09-18 NOTE — ED Notes (Signed)
Pt c/o generalized abd pain and vomiting since yesterday; sts emesis "turned black" today

## 2013-09-18 NOTE — ED Notes (Signed)
[facility] gave patient .5 Dilaudid, 4mg  of zofran,  and 1mg  of ativan. Vitals signs bp-107/84, hr-70, rr-16, 98% room air. NS at 26ml/hr. Patient has a small bowel obstruction. Transferred from [facility]

## 2013-09-18 NOTE — Consult Note (Addendum)
[facility]  [address] [place], [place] [address] Phone: [phone] FAX: [phone]     Tracey [name]  December 24, 1940 [id number]  CARE TEAM:  PCP: Phineas Inches, MD  Outpatient Care Team: Patient Care Team: Phineas Inches, MD as PCP - General (Family Medicine) Alvino Chapel, MD as Consulting Physician (Gynecologic Oncology) Ladell Pier, MD as Consulting Physician (Oncology)  Inpatient Treatment Team: Treatment Team: Attending Provider: Fredia Sorrow, MD; Registered Nurse: Connye Burkitt, RN; Consulting Physician: Nolon Nations, MD  This patient is a 73 y.o.female who presents today for surgical evaluation at the request of Dr. Rogene Houston.   Reason for evaluation: recurrent SBO  Pleasant woman.  Comes today with her son.  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  Treated with Gleevec until summer 2010 after that.  No evidence of recurrence in the past 5 years.  Did develop small bowel obstruction 2012.  Initially treated at [facility].  Transferred to [facility].  Resolved nonoperatively.  Patient notes she began having worsening nausea abdominal pain for the past few days.  Then began to have vomiting.  Tried Pepto-Bismol and other medications without much help.  Had episode of black emesis.  This concerned her.  Went to [facility] emergency room.  Workup concerning for bowel obstruction.  Surgical consultation requested.  Transferred to [facility].  No personal nor family history of GI/colon cancer, inflammatory bowel disease, irritable bowel syndrome, allergy such as Celiac Sprue, dietary/dairy problems, colitis, ulcers nor gastritis.  No recent sick contacts/gastroenteritis.  No travel outside the country.  No changes in diet.  No dysphagia to solids or liquids.  No significant heartburn or reflux.  PPI for mild GERD.   No hematochezia, hematemesis.  No evidence of prior gastric/peptic ulceration.  Normally can walk 15-20 minutes without difficulty    Past Medical History  Diagnosis Date  . Hypertension   . Anxiety   . GIST (gastrointestinal stroma tumor), malignant, colon   . Cardiac conduction disorder 03/30/2012    Overview:  STORY: ETT 03/09/2012 Echo 03/07/2012 also normal Dr Karilyn Cota: ?sick sinus syndrome.  HR is in the 40's.  Increased fatigue over the last month.  BP is stable   . History of colon polyps 10/24/2008  . Diverticulosis of colon   . Manic disorder, single episode, in full remission 12/01/2009    Past Surgical History  Procedure Laterality Date  . Bilateral salpingoophorectomy  09/22/2007  . Ileocecetomy  09/22/2007  . Small intestine surgery  09/22/2007  . Total vaginal hysterectomy  1986    Fibroids  . Ovarian cyst removal Right 1967    History   Social History  . Marital Status: Widowed    Spouse Name: N/A    Number of Children: N/A  . Years of Education: N/A   Occupational History  . Not on file.   Social History Main Topics  . Smoking status: Never Smoker   . Smokeless tobacco: Not on file  . Alcohol Use: No  . Drug Use: No  . Sexual Activity: Not Currently    Birth Control/ Protection: Post-menopausal   Other Topics Concern  . Not on file   Social History Narrative  . No narrative on file    No family history on file.  Current Facility-Administered Medications  Medication Dose Route Frequency Provider Last Rate Last Dose  . 0.9 %  sodium chloride infusion   Intravenous Continuous Fredia Sorrow, MD 75 mL/hr at 09/18/13 1904     Current Outpatient Prescriptions  Medication Sig Dispense Refill  . ALPRAZolam (XANAX) 0.5 MG tablet Take 0.5 mg by mouth as needed.      Marland Kitchen aspirin 325 MG tablet Take 325 mg by mouth daily.        . dorzolamide (TRUSOPT) 2 % ophthalmic solution Place 1 drop into the right eye 2 (two) times daily.      .  fexofenadine (ALLEGRA) 180 MG tablet Take 180 mg by mouth daily.      Marland Kitchen latanoprost (XALATAN) 0.005 % ophthalmic solution Place 1 drop into both eyes at bedtime.      Marland Kitchen lithium carbonate 300 MG capsule Take 300 mg by mouth 2 (two) times daily with a meal.        . losartan (COZAAR) 25 MG tablet Take 25 mg by mouth daily.      . Multiple Vitamin (MULTIVITAMIN) tablet Take 1 tablet by mouth daily.      Marland Kitchen omeprazole (PRILOSEC) 10 MG capsule Take 10 mg by mouth 2 (two) times daily.      . simvastatin (ZOCOR) 20 MG tablet Take 20 mg by mouth daily.           Allergies  Allergen Reactions  . Lisinopril Cough  . Penicillins Itching    ROS: Constitutional:  No fevers, chills, sweats.  Weight stable Eyes:  No vision changes, No discharge HENT:  No sore throats, nasal drainage Lymph: No neck swelling, No bruising easily Pulmonary:  No cough, productive sputum CV: No orthopnea, PND  Patient walks 20 minutes for about 1/2 miles without difficulty.  No exertional chest/neck/shoulder/arm pain. GI: No personal nor family history of GI/colon cancer, inflammatory bowel disease, irritable bowel syndrome, allergy such as Celiac Sprue, dietary/dairy problems, colitis, ulcers nor gastritis.  No recent sick contacts/gastroenteritis.  No travel outside the country.  No changes in diet. Renal: No UTIs, No hematuria Genital:  No drainage, bleeding, masses Musculoskeletal: No severe joint pain.  Good ROM major joints Skin:  No sores or lesions.  No rashes Heme/Lymph:  No easy bleeding.  No swollen lymph nodes Neuro: No focal weakness/numbness.  No seizures Psych: No suicidal ideation.  No hallucinations  BP 145/79  Pulse 66  Temp(Src) 97.8 F (36.6 C) (Oral)  Resp 15  Ht $R'5\' 2"'rY$  (1.575 m)  Wt 125 lb (56.7 kg)  BMI 22.86 kg/m2  SpO2 100%  Physical Exam: General: Pt awake/alert/oriented x4 in mild major acute distress.  Not toxic Eyes: PERRL, normal EOM. Sclera nonicteric Neuro: CN II-XII intact w/o  focal sensory/motor deficits. Lymph: No head/neck/groin lymphadenopathy Psych:  No delerium/psychosis/paranoia.  Sleepy - s/p Ativan & IV narcotics HENT: Normocephalic, Mucus membranes moist.  No thrush Neck: Supple, No tracheal deviation Chest: No pain.  Good respiratory excursion.  CTA bil;aterally CV:  Pulses intact.  Regular rhythm Abdomen: Soft, Moderately distended.  Mildly tender.  No incarcerated hernias.  No rebound tenderness.  No guarding.  No evidence of any incisional or umbilical hernias Genital: Normal external female genitalia.  No vaginal bleeding/discharge.  No inguinal hernias. Ext:  SCDs BLE.  No significant edema.  No cyanosis Skin: No petechiae / purpurea.  No major sores Musculoskeletal: No severe joint pain.  Good ROM major joints   Results:   Labs: Results for orders placed during the hospital encounter of 09/18/13 (from the past 48 hour(s))  COMPREHENSIVE METABOLIC PANEL  Status: Abnormal   Collection Time    09/18/13  6:10 PM      Result Value Ref Range   Sodium 138  137 - 147 mEq/L   Potassium 4.0  3.7 - 5.3 mEq/L   Chloride 96  96 - 112 mEq/L   CO2 23  19 - 32 mEq/L   Glucose, Bld 130 (*) 70 - 99 mg/dL   BUN 22  6 - 23 mg/dL   Creatinine, Ser 0.80  0.50 - 1.10 mg/dL   Calcium 11.6 (*) 8.4 - 10.5 mg/dL   Total Protein 8.1  6.0 - 8.3 g/dL   Albumin 4.5  3.5 - 5.2 g/dL   AST 24  0 - 37 U/L   ALT 13  0 - 35 U/L   Alkaline Phosphatase 91  39 - 117 U/L   Total Bilirubin 0.9  0.3 - 1.2 mg/dL   GFR calc non Af Amer 72 (*) >90 mL/min   GFR calc Af Amer 83 (*) >90 mL/min   Comment: (NOTE)     The eGFR has been calculated using the CKD EPI equation.     This calculation has not been validated in all clinical situations.     eGFR's persistently <90 mL/min signify possible Chronic Kidney     Disease.   Anion gap 19 (*) 5 - 15  LIPASE, BLOOD     Status: None   Collection Time    09/18/13  6:10 PM      Result Value Ref Range   Lipase 22  11 - 59 U/L   CBC WITH DIFFERENTIAL     Status: Abnormal   Collection Time    09/18/13  6:10 PM      Result Value Ref Range   WBC 11.4 (*) 4.0 - 10.5 K/uL   RBC 4.42  3.87 - 5.11 MIL/uL   Hemoglobin 14.1  12.0 - 15.0 g/dL   HCT 42.9  36.0 - 46.0 %   MCV 97.1  78.0 - 100.0 fL   MCH 31.9  26.0 - 34.0 pg   MCHC 32.9  30.0 - 36.0 g/dL   RDW 12.4  11.5 - 15.5 %   Platelets 324  150 - 400 K/uL   Neutrophils Relative % 90 (*) 43 - 77 %   Neutro Abs 10.2 (*) 1.7 - 7.7 K/uL   Lymphocytes Relative 5 (*) 12 - 46 %   Lymphs Abs 0.6 (*) 0.7 - 4.0 K/uL   Monocytes Relative 5  3 - 12 %   Monocytes Absolute 0.6  0.1 - 1.0 K/uL   Eosinophils Relative 0  0 - 5 %   Eosinophils Absolute 0.0  0.0 - 0.7 K/uL   Basophils Relative 0  0 - 1 %   Basophils Absolute 0.0  0.0 - 0.1 K/uL    Imaging / Studies: Ct Abdomen Pelvis W Contrast  09/18/2013   CLINICAL DATA:  Abdominal pain and distention. Coffee-ground emesis. Previous small bowel malignant GIST treated with surgery and chemotherapy.  EXAM: CT ABDOMEN AND PELVIS WITH CONTRAST  TECHNIQUE: Multidetector CT imaging of the abdomen and pelvis was performed using the standard protocol following bolus administration of intravenous contrast.  CONTRAST:  138mL OMNIPAQUE IOHEXOL 300 MG/ML  SOLN  COMPARISON:  04/24/2012  FINDINGS: Liver: 1.7 cm hypervascular lesion in the dome of the liver stable common consistent with a benign etiology such as a flash filling hemangioma. Other tiny fluid attenuation hepatic cysts are noted an are also stable.  Gallbladder/Biliary:  Unremarkable.  Pancreas: No mass, inflammatory changes, or other parenchymal abnormality identified.  Spleen:  Within normal limits in size and appearance.  Adrenal Glands:  No mass identified.  Kidneys/Urinary Tract: No masses identified. No evidence of hydronephrosis. A few tiny renal cysts are noted.  Lymph Nodes:  No pathologically enlarged lymph nodes identified.  Pelvic/Reproductive Organs: Prior hysterectomy noted.  Adnexal regions are unremarkable in appearance.  Bowel/Peritoneum: Small hiatal hernia noted. Moderate dilatation of small bowel seen the left abdomen pelvis, with transition point in the left pelvis near site of surgical clips. Remainder of small bowel is nondilated. This consistent with a high-grade small bowel obstruction, likely due to adhesion. Small amount of free fluid is seen in the pelvic cul-de-sac, however there is no evidence of abscess or free air. No soft tissue masses are identified.  Vascular:  No evidence of abdominal aortic aneurysm.  Musculoskeletal:  No suspicious bone lesions identified.  Other: Right breast soft tissue mass is seen measuring 2.0 cm on image 2 compared to 1.6 cm previously. Breast carcinoma cannot be excluded.  IMPRESSION: Small bowel obstruction with transition point in the left pelvis near surgical clips, likely due to adhesion.  No definite evidence of recurrent malignancy or metastatic disease within the abdomen or pelvis.   Electronically Signed   By: Myles Rosenthal M.D.   On: 09/18/2013 20:28    Medications / Allergies: per chart  Antibiotics: Anti-infectives   None      Assessment  Tracey [name]  73 y.o. 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  No strong evidence of recurrence of the GIST  Plan:  Admit.  IV fluids.  Nasogastric tube given moderate distention and hiccups.  Followup x-rays morning to  see if contrast progressing in the colon.  Place on higher dose proton pump inhibitor given questionable hematemesis.  Hemoglobin is normal although most likely falsely elevated due to dehydrated state from vomiting and bowel obstruction.  Control hypertension.  Control glaucoma.  Follow anxiety with when necessary Ativan.  Follow bipolar disease off oral lithium.  Restart once bowel function returns  Medicine and psychiatric consultations as needed.  Hold off for now.  VTE prophylaxis- SCDs, etc  Mobilize as tolerated to help recovery  I updated the status of the patient to the patient & her son.  Pathophysiology of small bowel obstruction and differential diagnosis discussed.  Hospital treatment and possible need for surgery as backup plan discussed:  The anatomy & physiology of the digestive tract was discussed.  The pathophysiology of intestinal obstruction was discussed.  Natural history risks without surgery was discussed.   I feel the patient has failed non-operative therapies.  The risks of no intervention will lead to serious problems such as necrosis, perforation, dehydration, etc. that outweigh the operative risks; therefore, I recommended abdominal exploration to diagnose & treat the source of the problem.  Laparoscopic & open techniques were discussed.   I expressed a good likelihood that surgery will  treat the problem.  Risks such as bleeding, infection, abscess, leak, reoperation, bowel resection, possible ostomy, hernia, heart attack, death, and other risks were discussed.   I noted a good likelihood this will help address the problem.  Goals of post-operative recovery were discussed as well.  We will work to minimize complications. Questions were answered.  The patient expresses understanding & wishes to proceed with surgery as a backup plan.  I made recommendations.  I answered questions.  Understanding & appreciation was expressed.     Adin Hector, M.D.,  F.A.C.S. Gastrointestinal and Minimally Invasive Surgery [facility] [address], [place], [place] [address] [phone] Main / Paging   09/18/2013  Note: Portions of this report may have been transcribed using voice recognition software. Every effort was made to ensure accuracy; however, inadvertent computerized transcription errors may be present.   Any transcriptional errors that result from this process are unintentional.

## 2013-09-18 NOTE — ED Notes (Signed)
Bed: WA01 Expected date: 09/18/13 Expected time: 9:59 PM Means of arrival: Ambulance Comments: [facility], 72 F, SBO

## 2013-09-19 LAB — CBC
HEMATOCRIT: 36.9 % (ref 36.0–46.0)
Hemoglobin: 11.8 g/dL — ABNORMAL LOW (ref 12.0–15.0)
MCH: 31.2 pg (ref 26.0–34.0)
MCHC: 32 g/dL (ref 30.0–36.0)
MCV: 97.6 fL (ref 78.0–100.0)
Platelets: 277 10*3/uL (ref 150–400)
RBC: 3.78 MIL/uL — ABNORMAL LOW (ref 3.87–5.11)
RDW: 13 % (ref 11.5–15.5)
WBC: 3.9 10*3/uL — ABNORMAL LOW (ref 4.0–10.5)

## 2013-09-19 LAB — BASIC METABOLIC PANEL
Anion gap: 10 (ref 5–15)
BUN: 19 mg/dL (ref 6–23)
CHLORIDE: 101 meq/L (ref 96–112)
CO2: 25 mEq/L (ref 19–32)
Calcium: 9.7 mg/dL (ref 8.4–10.5)
Creatinine, Ser: 0.81 mg/dL (ref 0.50–1.10)
GFR calc non Af Amer: 71 mL/min — ABNORMAL LOW (ref >90)
GFR, EST AFRICAN AMERICAN: 82 mL/min — AB (ref >90)
Glucose, Bld: 144 mg/dL — ABNORMAL HIGH (ref 70–99)
Potassium: 4.5 mEq/L (ref 3.7–5.3)
Sodium: 136 mEq/L — ABNORMAL LOW (ref 137–147)

## 2013-09-19 MED ORDER — METHOCARBAMOL 1000 MG/10ML IJ SOLN
500.0000 mg | Freq: Four times a day (QID) | INTRAVENOUS | Status: DC | PRN
  Filled 2013-09-19: qty 5

## 2013-09-19 MED ORDER — CHLORHEXIDINE GLUCONATE 0.12 % MT SOLN
15.0000 mL | Freq: Two times a day (BID) | OROMUCOSAL | Status: DC
  Filled 2013-09-19 (×2): qty 15

## 2013-09-19 MED ORDER — METHOCARBAMOL 1000 MG/10ML IJ SOLN
1000.0000 mg | Freq: Four times a day (QID) | INTRAMUSCULAR | Status: DC | PRN
  Filled 2013-09-19: qty 10

## 2013-09-19 MED ORDER — CETYLPYRIDINIUM CHLORIDE 0.05 % MT LIQD
7.0000 mL | Freq: Two times a day (BID) | OROMUCOSAL | Status: DC
  Administered 2013-09-19 – 2013-09-21 (×5): 7 mL via OROMUCOSAL

## 2013-09-19 MED ORDER — CHLORHEXIDINE GLUCONATE 0.12 % MT SOLN
15.0000 mL | Freq: Two times a day (BID) | OROMUCOSAL | Status: DC
  Administered 2013-09-19 – 2013-09-22 (×6): 15 mL via OROMUCOSAL
  Filled 2013-09-19 (×9): qty 15

## 2013-09-19 NOTE — Progress Notes (Signed)
Subjective: Feels better.  Not a much pain.  Does not feel distended.  Passed a little gas.  Objective: Vital signs in last 24 hours: Temp:  [97.8 F (36.6 C)-98.7 F (37.1 C)] 98.3 F (36.8 C) (09/06 0530) Pulse Rate:  [62-79] 62 (09/06 0530) Resp:  [14-22] 14 (09/06 0530) BP: (112-145)/(59-79) 129/59 mmHg (09/06 0530) SpO2:  [98 %-100 %] 99 % (09/06 0530) Weight:  [125 lb (56.7 kg)] 125 lb (56.7 kg) (09/06 0022) Last BM Date: 09/18/13  Intake/Output from previous day: 09/05 0701 - 09/06 0700 In: 631.3 [I.V.:631.3] Out: 950 [Emesis/NG output:950-bilious Intake/Output this shift: Total I/O In: 120 [NG/GT:120] Out: -   PE: General- In NAD Abdomen-soft, non-tender  Lab Results:   Recent Labs  09/18/13 1810 09/19/13 0625  WBC 11.4* 3.9*  HGB 14.1 11.8*  HCT 42.9 36.9  PLT 324 277   BMET  Recent Labs  09/18/13 1810 09/19/13 0625  NA 138 136*  K 4.0 4.5  CL 96 101  CO2 23 25  GLUCOSE 130* 144*  BUN 22 19  CREATININE 0.80 0.81  CALCIUM 11.6* 9.7   PT/INR No results found for this basename: LABPROT, INR,  in the last 72 hours Comprehensive Metabolic Panel:    Component Value Date/Time   NA 136* 09/19/2013 0625   NA 138 09/18/2013 1810   NA 141 04/24/2012 0929   NA 141 10/29/2011 0823   NA 139 04/02/2011 0906   NA 143 08/06/2010 0958   K 4.5 09/19/2013 0625   K 4.0 09/18/2013 1810   K 4.4 04/24/2012 0929   K 4.5 10/29/2011 0823   K 4.1 04/02/2011 0906   K 4.7 08/06/2010 0958   CL 101 09/19/2013 0625   CL 96 09/18/2013 1810   CL 109* 04/24/2012 0929   CL 108* 10/29/2011 0823   CL 97* 04/02/2011 0906   CL 103 08/06/2010 0958   CO2 25 09/19/2013 0625   CO2 23 09/18/2013 1810   CO2 23 04/24/2012 0929   CO2 23 10/29/2011 0823   CO2 27 04/02/2011 0906   CO2 28 08/06/2010 0958   BUN 19 09/19/2013 0625   BUN 22 09/18/2013 1810   BUN 20.6 04/24/2012 0929   BUN 20.0 10/29/2011 0823   BUN 15 04/02/2011 0906   BUN 17 08/06/2010 0958   CREATININE 0.81 09/19/2013 0625   CREATININE  0.80 09/18/2013 1810   CREATININE 0.9 04/24/2012 0929   CREATININE 0.9 10/29/2011 0823   CREATININE 0.9 04/02/2011 0906   CREATININE 0.8 08/06/2010 0958   GLUCOSE 144* 09/19/2013 0625   GLUCOSE 130* 09/18/2013 1810   GLUCOSE 85 04/24/2012 0929   GLUCOSE 93 10/29/2011 0823   GLUCOSE 88 04/02/2011 0906   GLUCOSE 87 08/06/2010 0958   CALCIUM 9.7 09/19/2013 0625   CALCIUM 11.6* 09/18/2013 1810   CALCIUM 10.0 04/24/2012 0929   CALCIUM 9.7 10/29/2011 0823   CALCIUM 9.3 04/02/2011 0906   CALCIUM 9.5 08/06/2010 0958   AST 24 09/18/2013 1810   AST 20 04/24/2012 0929   AST 22 10/29/2011 0823   AST 20 04/02/2011 0906   AST 21 12/06/2010 0440   AST 27 08/06/2010 0958   ALT 13 09/18/2013 1810   ALT 12 04/24/2012 0929   ALT 15 10/29/2011 0823   ALT 16 04/02/2011 0906   ALT 14 12/06/2010 0440   ALT 15 08/06/2010 0958   ALKPHOS 91 09/18/2013 1810   ALKPHOS 75 04/24/2012 0929   ALKPHOS 78 10/29/2011 0823   ALKPHOS 67  04/02/2011 0906   ALKPHOS 49 12/06/2010 0440   ALKPHOS 49 08/06/2010 0958   BILITOT 0.9 09/18/2013 1810   BILITOT 0.36 04/24/2012 0929   BILITOT 0.70 10/29/2011 0823   BILITOT 0.50 04/02/2011 0906   BILITOT 0.1* 12/06/2010 0440   BILITOT 0.70 08/06/2010 0958   PROT 8.1 09/18/2013 1810   PROT 6.7 04/24/2012 0929   PROT 6.5 10/29/2011 0823   PROT 6.5 04/02/2011 0906   PROT 5.4* 12/06/2010 0440   PROT 6.6 08/06/2010 0958   ALBUMIN 4.5 09/18/2013 1810   ALBUMIN 3.5 04/24/2012 0929   ALBUMIN 3.9 10/29/2011 0823   ALBUMIN 2.8* 12/06/2010 0440     Studies/Results: Ct Abdomen Pelvis W Contrast  09/18/2013   CLINICAL DATA:  Abdominal pain and distention. Coffee-ground emesis. Previous small bowel malignant GIST treated with surgery and chemotherapy.  EXAM: CT ABDOMEN AND PELVIS WITH CONTRAST  TECHNIQUE: Multidetector CT imaging of the abdomen and pelvis was performed using the standard protocol following bolus administration of intravenous contrast.  CONTRAST:  147mL OMNIPAQUE IOHEXOL 300 MG/ML  SOLN  COMPARISON:   04/24/2012  FINDINGS: Liver: 1.7 cm hypervascular lesion in the dome of the liver stable common consistent with a benign etiology such as a flash filling hemangioma. Other tiny fluid attenuation hepatic cysts are noted an are also stable.  Gallbladder/Biliary:  Unremarkable.  Pancreas: No mass, inflammatory changes, or other parenchymal abnormality identified.  Spleen:  Within normal limits in size and appearance.  Adrenal Glands:  No mass identified.  Kidneys/Urinary Tract: No masses identified. No evidence of hydronephrosis. A few tiny renal cysts are noted.  Lymph Nodes:  No pathologically enlarged lymph nodes identified.  Pelvic/Reproductive Organs: Prior hysterectomy noted. Adnexal regions are unremarkable in appearance.  Bowel/Peritoneum: Small hiatal hernia noted. Moderate dilatation of small bowel seen the left abdomen pelvis, with transition point in the left pelvis near site of surgical clips. Remainder of small bowel is nondilated. This consistent with a high-grade small bowel obstruction, likely due to adhesion. Small amount of free fluid is seen in the pelvic cul-de-sac, however there is no evidence of abscess or free air. No soft tissue masses are identified.  Vascular:  No evidence of abdominal aortic aneurysm.  Musculoskeletal:  No suspicious bone lesions identified.  Other: Right breast soft tissue mass is seen measuring 2.0 cm on image 2 compared to 1.6 cm previously. Breast carcinoma cannot be excluded.  IMPRESSION: Small bowel obstruction with transition point in the left pelvis near surgical clips, likely due to adhesion.  No definite evidence of recurrent malignancy or metastatic disease within the abdomen or pelvis.   Electronically Signed   By: Earle Gell M.D.   On: 09/18/2013 20:28   Dg Chest Port 1 View  09/18/2013   CLINICAL DATA:  Abdominal pain and emesis.  EXAM: PORTABLE CHEST - 1 VIEW  COMPARISON:  05/17/2008  FINDINGS: Lungs are adequately inflated without focal consolidation or  effusion. Cardiomediastinal silhouette is within normal. There is spondylosis of the thoracic spine.  IMPRESSION: No active disease.   Electronically Signed   By: Marin Olp M.D.   On: 09/18/2013 21:52   Dg Abd Acute W/chest  09/19/2013   CLINICAL DATA:  Abdominal pain. Vomiting. Followup small bowel obstruction. Previous bowel resection for GI stromal tumor.  EXAM: ACUTE ABDOMEN SERIES (ABDOMEN 2 VIEW & CHEST 1 VIEW)  COMPARISON:  None  FINDINGS: Dilated small bowel loops are seen within the central abdomen, with paucity of colonic gas. This is consistent with  small bowel obstruction. Contrast from recent CT is seen within the urinary tract. Surgical clips and staples seen in the right abdomen and pelvis. No evidence of free air.  Heart size is within normal limits. Both lungs are clear. No evidence of pleural effusion.  IMPRESSION: Findings consistent with small bowel obstruction.  No active cardiopulmonary disease.   Electronically Signed   By: Earle Gell M.D.   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  Repeat x-rays tomorrow.   Tracey [name] 09/19/2013

## 2013-09-20 ENCOUNTER — Inpatient Hospital Stay (HOSPITAL_COMMUNITY): Payer: Medicare PPO

## 2013-09-20 LAB — CBC
HCT: 35.1 % — ABNORMAL LOW (ref 36.0–46.0)
HEMOGLOBIN: 11.2 g/dL — AB (ref 12.0–15.0)
MCH: 31.5 pg (ref 26.0–34.0)
MCHC: 31.9 g/dL (ref 30.0–36.0)
MCV: 98.9 fL (ref 78.0–100.0)
Platelets: 221 10*3/uL (ref 150–400)
RBC: 3.55 MIL/uL — ABNORMAL LOW (ref 3.87–5.11)
RDW: 12.7 % (ref 11.5–15.5)
WBC: 4.5 10*3/uL (ref 4.0–10.5)

## 2013-09-20 NOTE — Progress Notes (Signed)
Subjective: Continues to pass gas and had a BM.  Objective: Vital signs in last 24 hours: Temp:  [97.7 F (36.5 C)-99 F (37.2 C)] 99 F (37.2 C) (09/07 0609) Pulse Rate:  [56-63] 63 (09/07 0609) Resp:  [14-17] 17 (09/07 0609) BP: (107-138)/(54-62) 138/62 mmHg (09/07 0609) SpO2:  [95 %-100 %] 95 % (09/07 0609) Last BM Date: 09/19/13  Intake/Output from previous day: 09/05 0701 - 09/06 0700 In: 631.3 [I.V.:631.3] Out: 950 [Emesis/NG output:950-bilious Intake/Output this shift:    PE: General- In NAD Abdomen-soft, non-tender, few bowel sounds  Lab Results:   Recent Labs  09/19/13 0625 09/20/13 0550  WBC 3.9* 4.5  HGB 11.8* 11.2*  HCT 36.9 35.1*  PLT 277 221   BMET  Recent Labs  09/18/13 1810 09/19/13 0625  NA 138 136*  K 4.0 4.5  CL 96 101  CO2 23 25  GLUCOSE 130* 144*  BUN 22 19  CREATININE 0.80 0.81  CALCIUM 11.6* 9.7   PT/INR No results found for this basename: LABPROT, INR,  in the last 72 hours Comprehensive Metabolic Panel:    Component Value Date/Time   NA 136* 09/19/2013 0625   NA 138 09/18/2013 1810   NA 141 04/24/2012 0929   NA 141 10/29/2011 0823   NA 139 04/02/2011 0906   NA 143 08/06/2010 0958   K 4.5 09/19/2013 0625   K 4.0 09/18/2013 1810   K 4.4 04/24/2012 0929   K 4.5 10/29/2011 0823   K 4.1 04/02/2011 0906   K 4.7 08/06/2010 0958   CL 101 09/19/2013 0625   CL 96 09/18/2013 1810   CL 109* 04/24/2012 0929   CL 108* 10/29/2011 0823   CL 97* 04/02/2011 0906   CL 103 08/06/2010 0958   CO2 25 09/19/2013 0625   CO2 23 09/18/2013 1810   CO2 23 04/24/2012 0929   CO2 23 10/29/2011 0823   CO2 27 04/02/2011 0906   CO2 28 08/06/2010 0958   BUN 19 09/19/2013 0625   BUN 22 09/18/2013 1810   BUN 20.6 04/24/2012 0929   BUN 20.0 10/29/2011 0823   BUN 15 04/02/2011 0906   BUN 17 08/06/2010 0958   CREATININE 0.81 09/19/2013 0625   CREATININE 0.80 09/18/2013 1810   CREATININE 0.9 04/24/2012 0929   CREATININE 0.9 10/29/2011 0823   CREATININE 0.9 04/02/2011 0906    CREATININE 0.8 08/06/2010 0958   GLUCOSE 144* 09/19/2013 0625   GLUCOSE 130* 09/18/2013 1810   GLUCOSE 85 04/24/2012 0929   GLUCOSE 93 10/29/2011 0823   GLUCOSE 88 04/02/2011 0906   GLUCOSE 87 08/06/2010 0958   CALCIUM 9.7 09/19/2013 0625   CALCIUM 11.6* 09/18/2013 1810   CALCIUM 10.0 04/24/2012 0929   CALCIUM 9.7 10/29/2011 0823   CALCIUM 9.3 04/02/2011 0906   CALCIUM 9.5 08/06/2010 0958   AST 24 09/18/2013 1810   AST 20 04/24/2012 0929   AST 22 10/29/2011 0823   AST 20 04/02/2011 0906   AST 21 12/06/2010 0440   AST 27 08/06/2010 0958   ALT 13 09/18/2013 1810   ALT 12 04/24/2012 0929   ALT 15 10/29/2011 0823   ALT 16 04/02/2011 0906   ALT 14 12/06/2010 0440   ALT 15 08/06/2010 0958   ALKPHOS 91 09/18/2013 1810   ALKPHOS 75 04/24/2012 0929   ALKPHOS 78 10/29/2011 0823   ALKPHOS 67 04/02/2011 0906   ALKPHOS 49 12/06/2010 0440   ALKPHOS 49 08/06/2010 0958   BILITOT 0.9 09/18/2013 1810   BILITOT 0.36  04/24/2012 0929   BILITOT 0.70 10/29/2011 0823   BILITOT 0.50 04/02/2011 0906   BILITOT 0.1* 12/06/2010 0440   BILITOT 0.70 08/06/2010 0958   PROT 8.1 09/18/2013 1810   PROT 6.7 04/24/2012 0929   PROT 6.5 10/29/2011 0823   PROT 6.5 04/02/2011 0906   PROT 5.4* 12/06/2010 0440   PROT 6.6 08/06/2010 0958   ALBUMIN 4.5 09/18/2013 1810   ALBUMIN 3.5 04/24/2012 0929   ALBUMIN 3.9 10/29/2011 0823   ALBUMIN 2.8* 12/06/2010 0440     Studies/Results: Ct Abdomen Pelvis W Contrast  09/18/2013   CLINICAL DATA:  Abdominal pain and distention. Coffee-ground emesis. Previous small bowel malignant GIST treated with surgery and chemotherapy.  EXAM: CT ABDOMEN AND PELVIS WITH CONTRAST  TECHNIQUE: Multidetector CT imaging of the abdomen and pelvis was performed using the standard protocol following bolus administration of intravenous contrast.  CONTRAST:  125mL OMNIPAQUE IOHEXOL 300 MG/ML  SOLN  COMPARISON:  04/24/2012  FINDINGS: Liver: 1.7 cm hypervascular lesion in the dome of the liver stable common consistent with a benign  etiology such as a flash filling hemangioma. Other tiny fluid attenuation hepatic cysts are noted an are also stable.  Gallbladder/Biliary:  Unremarkable.  Pancreas: No mass, inflammatory changes, or other parenchymal abnormality identified.  Spleen:  Within normal limits in size and appearance.  Adrenal Glands:  No mass identified.  Kidneys/Urinary Tract: No masses identified. No evidence of hydronephrosis. A few tiny renal cysts are noted.  Lymph Nodes:  No pathologically enlarged lymph nodes identified.  Pelvic/Reproductive Organs: Prior hysterectomy noted. Adnexal regions are unremarkable in appearance.  Bowel/Peritoneum: Small hiatal hernia noted. Moderate dilatation of small bowel seen the left abdomen pelvis, with transition point in the left pelvis near site of surgical clips. Remainder of small bowel is nondilated. This consistent with a high-grade small bowel obstruction, likely due to adhesion. Small amount of free fluid is seen in the pelvic cul-de-sac, however there is no evidence of abscess or free air. No soft tissue masses are identified.  Vascular:  No evidence of abdominal aortic aneurysm.  Musculoskeletal:  No suspicious bone lesions identified.  Other: Right breast soft tissue mass is seen measuring 2.0 cm on image 2 compared to 1.6 cm previously. Breast carcinoma cannot be excluded.  IMPRESSION: Small bowel obstruction with transition point in the left pelvis near surgical clips, likely due to adhesion.  No definite evidence of recurrent malignancy or metastatic disease within the abdomen or pelvis.   Electronically Signed   By: Earle Gell M.D.   On: 09/18/2013 20:28   Dg Chest Port 1 View  09/18/2013   CLINICAL DATA:  Abdominal pain and emesis.  EXAM: PORTABLE CHEST - 1 VIEW  COMPARISON:  05/17/2008  FINDINGS: Lungs are adequately inflated without focal consolidation or effusion. Cardiomediastinal silhouette is within normal. There is spondylosis of the thoracic spine.  IMPRESSION: No active  disease.   Electronically Signed   By: Marin Olp M.D.   On: 09/18/2013 21:52   Dg Abd Acute W/chest  09/19/2013   CLINICAL DATA:  Abdominal pain. Vomiting. Followup small bowel obstruction. Previous bowel resection for GI stromal tumor.  EXAM: ACUTE ABDOMEN SERIES (ABDOMEN 2 VIEW & CHEST 1 VIEW)  COMPARISON:  None  FINDINGS: Dilated small bowel loops are seen within the central abdomen, with paucity of colonic gas. This is consistent with small bowel obstruction. Contrast from recent CT is seen within the urinary tract. Surgical clips and staples seen in the right abdomen and pelvis.  No evidence of free air.  Heart size is within normal limits. Both lungs are clear. No evidence of pleural effusion.  IMPRESSION: Findings consistent with small bowel obstruction.  No active cardiopulmonary disease.   Electronically Signed   By: Earle Gell M.D.   On: 09/19/2013 00:21    Anti-infectives: Anti-infectives   None      Assessment Principal Problem:   SBO (small bowel obstruction)-continued clinical improvement, oral contrast in colon. 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  Clear liquids.   Dioselina Brumbaugh J 09/20/2013

## 2013-09-20 NOTE — Care Management Note (Signed)
    Page 1 of 1   09/20/2013     2:50:35 PM CARE MANAGEMENT NOTE 09/20/2013  Patient:  Tracey [name], Tracey [name]   Account Number:  [id number]  Date Initiated:  09/20/2013  Documentation initiated by:  Dessa Phi  Subjective/Objective Assessment:   73 Y/O F ADMITTED W/SBO.     Action/Plan:   FROM HOME.   Anticipated DC Date:  09/24/2013   Anticipated DC Plan:  [place]  CM consult      Choice offered to / List presented to:             Status of service:  In process, will continue to follow Medicare Important Message given?   (If response is "NO", the following Medicare IM given date fields will be blank) Date Medicare IM given:   Medicare IM given by:   Date Additional Medicare IM given:   Additional Medicare IM given by:    Discharge Disposition:    Per UR Regulation:  Reviewed for med. necessity/level of care/duration of stay  If discussed at Long Length of Stay Meetings, dates discussed:    Comments:  09/20/13 Benn Tarver RN,BSN NCM [phone] NO ANTICIPATED D/C NEEDS.

## 2013-09-21 LAB — BASIC METABOLIC PANEL
Anion gap: 12 (ref 5–15)
BUN: 6 mg/dL (ref 6–23)
CHLORIDE: 104 meq/L (ref 96–112)
CO2: 23 mEq/L (ref 19–32)
Calcium: 9.4 mg/dL (ref 8.4–10.5)
Creatinine, Ser: 0.87 mg/dL (ref 0.50–1.10)
GFR, EST AFRICAN AMERICAN: 75 mL/min — AB (ref >90)
GFR, EST NON AFRICAN AMERICAN: 65 mL/min — AB (ref >90)
GLUCOSE: 107 mg/dL — AB (ref 70–99)
Potassium: 4.5 mEq/L (ref 3.7–5.3)
SODIUM: 139 meq/L (ref 137–147)

## 2013-09-21 MED ORDER — LOSARTAN POTASSIUM 25 MG PO TABS
25.0000 mg | ORAL_TABLET | Freq: Every day | ORAL | Status: DC
  Administered 2013-09-21: 25 mg via ORAL
  Filled 2013-09-21 (×2): qty 1

## 2013-09-21 MED ORDER — LITHIUM CARBONATE 300 MG PO CAPS
300.0000 mg | ORAL_CAPSULE | Freq: Two times a day (BID) | ORAL | Status: DC
  Administered 2013-09-21 – 2013-09-22 (×2): 300 mg via ORAL
  Filled 2013-09-21 (×5): qty 1

## 2013-09-21 MED ORDER — PANTOPRAZOLE SODIUM 20 MG PO TBEC
20.0000 mg | DELAYED_RELEASE_TABLET | Freq: Every day | ORAL | Status: DC
  Administered 2013-09-21 – 2013-09-22 (×2): 20 mg via ORAL
  Filled 2013-09-21 (×2): qty 1

## 2013-09-21 MED ORDER — METHOCARBAMOL 1000 MG/10ML IJ SOLN
1000.0000 mg | Freq: Four times a day (QID) | INTRAVENOUS | Status: DC | PRN
  Filled 2013-09-21: qty 10

## 2013-09-21 MED ORDER — METHOCARBAMOL 1000 MG/10ML IJ SOLN
500.0000 mg | Freq: Four times a day (QID) | INTRAMUSCULAR | Status: DC | PRN
  Filled 2013-09-21: qty 5

## 2013-09-21 MED ORDER — SIMVASTATIN 20 MG PO TABS
20.0000 mg | ORAL_TABLET | Freq: Every day | ORAL | Status: DC
  Administered 2013-09-21 – 2013-09-22 (×2): 20 mg via ORAL
  Filled 2013-09-21 (×2): qty 1

## 2013-09-21 MED ORDER — ALPRAZOLAM ER 0.5 MG PO TB24
0.5000 mg | ORAL_TABLET | Freq: Two times a day (BID) | ORAL | Status: DC | PRN
  Administered 2013-09-22: 0.5 mg via ORAL
  Filled 2013-09-21 (×2): qty 1

## 2013-09-21 MED ORDER — LORATADINE 10 MG PO TABS
10.0000 mg | ORAL_TABLET | Freq: Every day | ORAL | Status: DC
  Administered 2013-09-21 – 2013-09-22 (×2): 10 mg via ORAL
  Filled 2013-09-21 (×3): qty 1

## 2013-09-21 MED ORDER — POLYETHYLENE GLYCOL 3350 17 G PO PACK
17.0000 g | PACK | Freq: Once | ORAL | Status: AC
  Administered 2013-09-21: 17 g via ORAL
  Filled 2013-09-21: qty 1

## 2013-09-21 NOTE — Progress Notes (Signed)
Patient interviewed and examined, agree with PA note above.  Edward Jolly MD, FACS  09/21/2013 11:23 AM

## 2013-09-21 NOTE — Progress Notes (Signed)
[facility] Progress Note     Subjective: Pt has no pain.  No N/V, tolerating some clears past NG tube, NG has been clamped for 24hrs.  She's ambulating well.  Does not have an IS yet.  Had a BM yesterday, having good flatus.    Objective: Vital signs in last 24 hours: Temp:  [98.2 F (36.8 C)-99.4 F (37.4 C)] 98.2 F (36.8 C) (09/08 0600) Pulse Rate:  [57-68] 57 (09/08 0600) Resp:  [16] 16 (09/08 0600) BP: (143-151)/(60-65) 143/65 mmHg (09/08 0600) SpO2:  [99 %] 99 % (09/08 0600) Last BM Date: 09/19/13  Intake/Output from previous day: 09-26-22 0701 - 09/08 0700 In: 8001 [P.O.:180; I.V.:2500] Out: 2150 [Urine:2150] Intake/Output this shift: Total I/O In: 240 [P.O.:240] Out: 400 [Urine:400]  PE: Gen:  Alert, NAD, pleasant Abd: Soft, NT/ND, +BS, no HSM, abdominal scars noted   Lab Results:   Recent Labs  09/19/13 0625 25-Sep-2013 0550  WBC 3.9* 4.5  HGB 11.8* 11.2*  HCT 36.9 35.1*  PLT 277 221   BMET  Recent Labs  09/18/13 1810 09/19/13 0625  NA 138 136*  K 4.0 4.5  CL 96 101  CO2 23 25  GLUCOSE 130* 144*  BUN 22 19  CREATININE 0.80 0.81  CALCIUM 11.6* 9.7   PT/INR No results found for this basename: LABPROT, INR,  in the last 72 hours CMP     Component Value Date/Time   NA 136* 09/19/2013 0625   NA 141 04/24/2012 0929   NA 139 04/02/2011 0906   K 4.5 09/19/2013 0625   K 4.4 04/24/2012 0929   K 4.1 04/02/2011 0906   CL 101 09/19/2013 0625   CL 109* 04/24/2012 0929   CL 97* 04/02/2011 0906   CO2 25 09/19/2013 0625   CO2 23 04/24/2012 0929   CO2 27 04/02/2011 0906   GLUCOSE 144* 09/19/2013 0625   GLUCOSE 85 04/24/2012 0929   GLUCOSE 88 04/02/2011 0906   BUN 19 09/19/2013 0625   BUN 20.6 04/24/2012 0929   BUN 15 04/02/2011 0906   CREATININE 0.81 09/19/2013 0625   CREATININE 0.9 04/24/2012 0929   CREATININE 0.9 04/02/2011 0906   CALCIUM 9.7 09/19/2013 0625   CALCIUM 10.0 04/24/2012 0929   CALCIUM 9.3 04/02/2011 0906   PROT 8.1 09/18/2013 1810   PROT 6.7  04/24/2012 0929   PROT 6.5 04/02/2011 0906   ALBUMIN 4.5 09/18/2013 1810   ALBUMIN 3.5 04/24/2012 0929   AST 24 09/18/2013 1810   AST 20 04/24/2012 0929   AST 20 04/02/2011 0906   ALT 13 09/18/2013 1810   ALT 12 04/24/2012 0929   ALT 16 04/02/2011 0906   ALKPHOS 91 09/18/2013 1810   ALKPHOS 75 04/24/2012 0929   ALKPHOS 67 04/02/2011 0906   BILITOT 0.9 09/18/2013 1810   BILITOT 0.36 04/24/2012 0929   BILITOT 0.50 04/02/2011 0906   GFRNONAA 71* 09/19/2013 0625   GFRAA 82* 09/19/2013 0625   Lipase     Component Value Date/Time   LIPASE 22 09/18/2013 1810       Studies/Results: Dg Abd 2 Views  09-25-13   CLINICAL DATA:  Small bowel obstruction.  EXAM: ABDOMEN - 2 VIEW  COMPARISON:  09/18/2013  FINDINGS: Nasogastric tube extends into the proximal stomach. There is improvement in small bowel dilatation with a central small bowel loop measuring approximately 4.8 cm. There is transit of some oral contrast into the colon. No free air.  IMPRESSION: Resolving partial small bowel obstruction.   Electronically  Signed   By: Aletta Edouard M.D.   On: 09/20/2013 10:02    Anti-infectives: Anti-infectives   None       Assessment/Plan SBO (small bowel obstruction)-continued clinical improvement, oral contrast in colon.  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  Tolerating NG clamp, pain resolved, no N/V - d/c NG tube and continue clears, advance diet as tolerated to fulls tonight and soft tomorrow AM 2.  Recheck labs tomorrow 3.  Red. IVF 4.  SCD's and heparin 5.  Ambulate and IS 6.  Dulcolax, miralax 7.  Possibly home as early as tomorrow if all goes well 8.  Restart home hld/htn/anxiety/bipolar meds    LOS: 3 days    Coralie Keens 09/21/2013, 8:58 AM Pager: [phone]

## 2013-09-22 LAB — BASIC METABOLIC PANEL
Anion gap: 8 (ref 5–15)
BUN: 5 mg/dL — ABNORMAL LOW (ref 6–23)
CHLORIDE: 106 meq/L (ref 96–112)
CO2: 25 mEq/L (ref 19–32)
Calcium: 9.6 mg/dL (ref 8.4–10.5)
Creatinine, Ser: 0.85 mg/dL (ref 0.50–1.10)
GFR calc non Af Amer: 67 mL/min — ABNORMAL LOW (ref >90)
GFR, EST AFRICAN AMERICAN: 77 mL/min — AB (ref >90)
Glucose, Bld: 107 mg/dL — ABNORMAL HIGH (ref 70–99)
POTASSIUM: 5 meq/L (ref 3.7–5.3)
SODIUM: 139 meq/L (ref 137–147)

## 2013-09-22 MED ORDER — POLYETHYLENE GLYCOL 3350 17 GM/SCOOP PO POWD: 8.5000 g | Freq: Two times a day (BID) | ORAL | Status: DC | PRN

## 2013-09-22 MED ORDER — BISACODYL 10 MG RE SUPP: 10.0000 mg | Freq: Every day | RECTAL | Status: DC | PRN

## 2013-09-22 MED ORDER — DOCUSATE SODIUM 50 MG PO CAPS: 50.0000 mg | ORAL_CAPSULE | Freq: Two times a day (BID) | ORAL | Status: DC | PRN

## 2013-09-22 NOTE — Discharge Summary (Signed)
[facility] Discharge Summary   Patient ID: Tracey [name] MRN: [id number] DOB/AGE: 06-14-1940 73 y.o.  Admit date: 09/18/2013 Discharge date: 09/22/2013  Admitting Diagnosis: SBO Anxiety state  Essential (primary) hypertension    Bipolar I disorder, single manic episode, in full remission      Discharge Diagnosis Patient Active Problem List   Diagnosis Date Noted  . SBO (small bowel obstruction) 09/18/2013  . Glaucoma 09/18/2013  . GERD (gastroesophageal reflux disease) 09/18/2013  . HLD (hyperlipidemia) 02/21/2013  . Essential (primary) hypertension 02/21/2013  . Barrett esophagus 02/19/2013  . Cardiac conduction disorder 03/30/2012  . Difficulty hearing 08/20/2011  . 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 Allergic rhinitis 09/14/2010  . Anxiety state 12/22/2009  . Bipolar I disorder, single manic episode, in full remission 12/01/2009  . 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 History of colon polyps 10/24/2008  . Arthritis, degenerative 10/24/2008    Consultants None  Imaging: No results found.  Procedures None  Hospital Course:  73 y/o white female who presented to [facility] with complaints of abdominal pain, N/V.  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 Treated with Gleevec until summer 2010 after that. No evidence of recurrence in the past 5 years. Did develop small bowel obstruction 2012. Initially treated at [facility]. Transferred to [facility]. Resolved nonoperatively.   Patient notes she began having worsening nausea abdominal pain for the past few days. Then began to have vomiting. Tried Pepto-Bismol and other medications without much help. Had episode of black emesis. This concerned her. Went to [place] emergency room. Workup concerning for bowel obstruction. Surgical  consultation requested. Transferred to [facility] long hospital.   No personal nor family history of GI/colon cancer, inflammatory bowel disease, irritable bowel syndrome, allergy such as Celiac Sprue, dietary/dairy problems, colitis, ulcers nor gastritis. No recent sick contacts/gastroenteritis. No travel outside the country. No changes in diet. No dysphagia to solids or liquids. No significant heartburn or reflux. PPI for mild GERD. No hematochezia, hematemesis. No evidence of prior gastric/peptic ulceration. Normally can walk 15-20 minutes without difficulty  Workup showed recurrent SBO with transition point in the left pelvis near clips from prior salpingo-oophorectomy/SBR/ileocecal resection for GIST.  Patient was admitted and was transferred to the floor for  Conservative management of her SBO with NPO, NGT, IVF, pain and nausea control.  Bowel function quickly returned and NG tube was clamped and she was started on clears.  Residuals were low thus the NG was discontinued and her diet was advanced as tolerated.  On HD #4, the patient was voiding well, tolerating diet, ambulating well, pain well controlled, vital signs stable, and felt stable for discharge home.  Patient will follow up in our office as needed and knows to call with questions or concerns.  She should follow up with her PCP at discharge for a post hospital follow up.  Physical Exam: General:  Alert, NAD, pleasant, comfortable Abd:  Soft, ND, mild tenderness, no HSM, scars well healed    Medication List         ALPRAZolam 0.5 MG 24 hr tablet  Commonly known as:  XANAX XR  Take 0.5 mg by mouth 2 (two) times daily as needed for anxiety.     aspirin 325 MG tablet  Take 325 mg by mouth daily.     bisacodyl 10 MG suppository  Commonly  known as:  DULCOLAX  Place 1 suppository (10 mg total) rectally daily as needed for moderate constipation.     docusate sodium 50 MG capsule  Commonly known as:  COLACE  Take 1 capsule (50 mg total)  by mouth 2 (two) times daily as needed for mild constipation.     dorzolamide 2 % ophthalmic solution  Commonly known as:  TRUSOPT  Place 1 drop into the right eye 2 (two) times daily.     fexofenadine 180 MG tablet  Commonly known as:  ALLEGRA  Take 180 mg by mouth daily.     ipratropium 0.06 % nasal spray  Commonly known as:  ATROVENT  Place 2 sprays into both nostrils daily.     latanoprost 0.005 % ophthalmic solution  Commonly known as:  XALATAN  Place 1 drop into both eyes at bedtime.     lithium carbonate 300 MG capsule  Take 300 mg by mouth 2 (two) times daily with a meal.     losartan 25 MG tablet  Commonly known as:  COZAAR  Take 25 mg by mouth daily.     multivitamin tablet  Take 1 tablet by mouth daily.     omeprazole 10 MG capsule  Commonly known as:  PRILOSEC  Take 10 mg by mouth 2 (two) times daily.     polyethylene glycol powder powder  Commonly known as:  MIRALAX  Take 8.5-34 g by mouth 2 (two) times daily as needed for moderate constipation or severe constipation. To correct constipation.  Adjust dose over 1-2 months.  Goal = ~1 bowel movement / day     simvastatin 20 MG tablet  Commonly known as:  ZOCOR  Take 20 mg by mouth daily.         Follow-up Information   Follow up with Phineas Inches, MD. (For post-hospital follow up)    Specialty:  Family Medicine   Contact information:   [address] [place] [address] [phone]       Call CCS,MD, MD. (As needed, If symptoms worsen)    Specialty:  General Surgery      Signed: Coralie Keens [facility] [phone]  09/22/2013, 10:53 AM

## 2013-09-22 NOTE — Discharge Instructions (Signed)
Small Bowel Obstruction A small bowel obstruction is a blockage (obstruction) of the small intestine (small bowel). The small bowel is a long, slender tube that connects the stomach to the colon. Its job is to absorb nutrients from the fluids and foods you consume into the bloodstream.  CAUSES  There are many causes of intestinal blockage. The most common ones include:  Hernias. This is a more common cause in children than adults.  Inflammatory bowel disease (enteritis and colitis).  Twisting of the bowel (volvulus).  Tumors.  Scar tissue (adhesions) from previous surgery or radiation treatment.  Recent surgery. This may cause an acute small bowel obstruction called an ileus. SYMPTOMS   Abdominal pain. This may be dull cramps or sharp pain. It may occur in one area or may be present in the entire abdomen. Pain can range from mild to severe, depending on the degree of obstruction.  Nausea and vomiting. Vomit may be greenish or yellow bile color.  Distended or swollen stomach. Abdominal bloating is a common symptom.  Constipation.  Lack of passing gas.  Frequent belching.  Diarrhea. This may occur if runny stool is able to leak around the obstruction. DIAGNOSIS  Your caregiver can usually diagnose small bowel obstruction by taking a history, doing a physical exam, and taking X-rays. If the cause is unclear, a CT scan (computerized tomography) of your abdomen and pelvis may be needed. TREATMENT  Treatment of the blockage depends on the cause and how bad the problem is.   Sometimes, the obstruction improves with bed rest and intravenous (IV) fluids.  Resting the bowel is very important. This means following a simple diet. Sometimes, a clear liquid diet may be required for several days.  Sometimes, a small tube (nasogastric tube) is placed into the stomach to decompress the bowel. When the bowel is blocked, it usually swells up like a balloon filled with air and fluids.  Decompression means that the air and fluids are removed by suction through that tube. This can help with pain, discomfort, and nausea. It can also help the obstruction resolve faster.  Surgery may be required if other treatments do not work. Bowel obstruction from a hernia may require early surgery and can be an emergency procedure. Adhesions that cause frequent or severe obstructions may also require surgery. HOME CARE INSTRUCTIONS If your bowel obstruction is only partial or incomplete, you may be allowed to go home.  Get plenty of rest.  Follow your diet as directed by your caregiver.  Only consume clear liquids until your condition improves.  Avoid solid foods as instructed. SEEK IMMEDIATE MEDICAL CARE IF:  You have increased pain or cramping.  You vomit blood.  You have uncontrolled vomiting or nausea.  You cannot drink fluids due to vomiting or pain.  You develop confusion.  You begin feeling very dry or thirsty (dehydrated).  You have severe bloating.  You have chills.  You have a fever.  You feel extremely weak or you faint. MAKE SURE YOU:  Understand these instructions.  Will watch your condition.  Will get help right away if you are not doing well or get worse. Document Released: 03/19/2005 Document Revised: 03/25/2011 Document Reviewed: 03/16/2010 [facility] Patient Information 2015 [place], [place]. This information is not intended to replace advice given to you by your health care provider. Make sure you discuss any questions you have with your health care provider.  Constipation Constipation is when a person has fewer than three bowel movements a week, has difficulty having  a bowel movement, or has stools that are dry, hard, or larger than normal. As people grow older, constipation is more common. If you try to fix constipation with medicines that make you have a bowel movement (laxatives), the problem may get worse. Long-term laxative use may cause the muscles  of the colon to become weak. A low-fiber diet, not taking in enough fluids, and taking certain medicines may make constipation worse.  CAUSES   Certain medicines, such as antidepressants, pain medicine, iron supplements, antacids, and water pills.   Certain diseases, such as diabetes, irritable bowel syndrome (IBS), thyroid disease, or depression.   Not drinking enough water.   Not eating enough fiber-rich foods.   Stress or travel.   Lack of physical activity or exercise.   Ignoring the urge to have a bowel movement.   Using laxatives too much.  SIGNS AND SYMPTOMS   Having fewer than three bowel movements a week.   Straining to have a bowel movement.   Having stools that are hard, dry, or larger than normal.   Feeling full or bloated.   Pain in the lower abdomen.   Not feeling relief after having a bowel movement.  DIAGNOSIS  Your health care provider will take a medical history and perform a physical exam. Further testing may be done for severe constipation. Some tests may include:  A barium enema X-ray to examine your rectum, colon, and, sometimes, your small intestine.   A sigmoidoscopy to examine your lower colon.   A colonoscopy to examine your entire colon. TREATMENT  Treatment will depend on the severity of your constipation and what is causing it. Some dietary treatments include drinking more fluids and eating more fiber-rich foods. Lifestyle treatments may include regular exercise. If these diet and lifestyle recommendations do not help, your health care provider may recommend taking over-the-counter laxative medicines to help you have bowel movements. Prescription medicines may be prescribed if over-the-counter medicines do not work.  HOME CARE INSTRUCTIONS   Eat foods that have a lot of fiber, such as fruits, vegetables, whole grains, and beans.  Limit foods high in fat and processed sugars, such as french fries, hamburgers, cookies, candies,  and soda.   A fiber supplement may be added to your diet if you cannot get enough fiber from foods.   Drink enough fluids to keep your urine clear or pale yellow.   Exercise regularly or as directed by your health care provider.   Go to the restroom when you have the urge to go. Do not hold it.   Only take over-the-counter or prescription medicines as directed by your health care provider. Do not take other medicines for constipation without talking to your health care provider first.  [place] IF:   You have bright red blood in your stool.   Your constipation lasts for more than 4 days or gets worse.   You have abdominal or rectal pain.   You have thin, pencil-like stools.   You have unexplained weight loss. MAKE SURE YOU:   Understand these instructions.  Will watch your condition.  Will get help right away if you are not doing well or get worse. Document Released: 09/29/2003 Document Revised: 01/05/2013 Document Reviewed: 10/12/2012 [facility] Patient Information 2015 [place], [place]. This information is not intended to replace advice given to you by your health care provider. Make sure you discuss any questions you have with your health care provider.  Soft-Food Meal Plan A soft-food meal plan includes  foods that are safe and easy to swallow. This meal plan typically is used:  If you are having trouble chewing or swallowing foods.  As a transition meal plan after only having had liquid meals for a long period. WHAT DO I NEED TO KNOW ABOUT THE SOFT-FOOD MEAL PLAN? A soft-food meal plan includes tender foods that are soft and easy to chew and swallow. In most cases, bite-sized pieces of food are easier to swallow. A bite-sized piece is about  inch or smaller. Foods in this plan do not need to be ground or pureed. Foods that are very hard, crunchy, or sticky should be avoided. Also, breads, cereals, yogurts, and desserts with nuts, seeds, or fruits  should be avoided. WHAT FOODS CAN I EAT? Grains Rice and wild rice. Moist bread, dressing, pasta, and noodles. Well-moistened dry or cooked cereals, such as farina (cooked wheat cereal), oatmeal, or grits. Biscuits, breads, muffins, pancakes, and waffles that have been well moistened. Vegetables Shredded lettuce. Cooked, tender vegetables, including potatoes without skins. Vegetable juices. Broths or creamed soups made with vegetables that are not stringy or chewy. Strained tomatoes (without seeds). Fruits Canned or well-cooked fruits. Soft (ripe), peeled fresh fruits, such as peaches, nectarines, kiwi, cantaloupe, honeydew melon, and watermelon (without seeds). Soft berries with small seeds, such as strawberries. Fruit juices (without pulp). Meats and Other Protein Sources Moist, tender, lean beef. Mutton. Lamb. Veal. Chicken. [place]. Liver. Ham. Fish without bones. Eggs. Dairy Milk, milk drinks, and cream. Plain cream cheese and cottage cheese. Plain yogurt. Sweets/Desserts Flavored gelatin desserts. Custard. Plain ice cream, frozen yogurt, sherbet, milk shakes, and malts. Plain cakes and cookies. Plain hard candy.  Other Butter, margarine (without trans fat), and cooking oils. Mayonnaise. Cream sauces. Mild spices, salt, and sugar. Syrup, molasses, honey, and jelly. The items listed above may not be a complete list of recommended foods or beverages. Contact your dietitian for more options. WHAT FOODS ARE NOT RECOMMENDED? Grains Dry bread, toast, crackers that have not been moistened. Coarse or dry cereals, such as bran, granola, and shredded wheat. Tough or chewy crusty breads, such as [place] bread or baguettes. Vegetables Corn. Raw vegetables except shredded lettuce. Cooked vegetables that are tough or stringy. Tough, crisp, fried potatoes and potato skins. Fruits Fresh fruits with skins or seeds or both, such as apples, pears, or grapes. Stringy, high-pulp fruits, such as papaya,  pineapple, coconut, or mango. Fruit leather, fruit roll-ups, and all dried fruits. Meats and Other Protein Sources Sausages and hot dogs. Meats with gristle. Fish with bones. Nuts, seeds, and chunky peanut or other nut butters. Sweets/Desserts Cakes or cookies that are very dry or chewy.  The items listed above may not be a complete list of foods and beverages to avoid. Contact your dietitian for more information. Document Released: 04/09/2007 Document Revised: 01/05/2013 Document Reviewed: 11/27/2012 [facility] Patient Information 2015 [place], [place]. This information is not intended to replace advice given to you by your health care provider. Make sure you discuss any questions you have with your health care provider.

## 2013-09-22 NOTE — Progress Notes (Signed)
Patient has had bowel movement and feels ready for discharge. Discharge instructions discussed with patient . Allowed time for questions.  Assessment unchanged

## 2013-12-20 LAB — HM DEXA SCAN

## 2015-10-16 ENCOUNTER — Encounter: Payer: Self-pay | Admitting: Neurology

## 2015-10-16 ENCOUNTER — Ambulatory Visit (INDEPENDENT_AMBULATORY_CARE_PROVIDER_SITE_OTHER): Payer: Medicare Other | Admitting: Neurology

## 2015-10-16 DIAGNOSIS — H532 Diplopia: Secondary | ICD-10-CM

## 2015-10-16 NOTE — Progress Notes (Addendum)
PATIENT: Tracey [name] DOB: 04/14/1940  Chief Complaint  Patient presents with  . Diplopia    Reports developing double vision, bilaterally, three months ago. She would like to further review her recent MRI and treatments for her symptoms.     HISTORICAL  Tracey [name] is a 75 years old right-handed female, seen in refer by her ophthalmologist Dr. Nathanial Rancher for evaluation of double vision October second 2017, her primary care physician is Dr. Bernerd Limbo  I reviewed evaluation by Dr. 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, also with diagnosis of primary open angle glaucoma, advanced in right eye, suspected in left eye, history cataract on the left side, the decision was to observe the symptoms, without intervention,  She had a past medical history of HTN, bipolar disorder, has been treated lithium 300mg  bid, her mood has been stable, reported a history of right retinal ischemic event in 04/16/1998, with partial right visual block, involving right inferior nasal field, she is a retired Pharmacist, hospital, now make wedding dress, She complains of excessive stress over the past couple years, her husband passed away in Apr 15, 2013, her older son is dealing with cancer and family issues  In July 2017, while she was on golf field, she noticed binocular double vision when looking straight ahead or to right or left, she also noticed double vision when she watching TV at her left visual field, she enjoy sewing wedding dress, there was no difficulty with near vision. She seems to notice more double vision when looking in the far visual field.  She denies bulbar or limb muscle weakness, in specific, no ptosis, no dysphagia, no gait abnormality.  She denies significant improvement over the past 3 months  On today's examination, she had mild parkinsonian features, decreased  bilateral arm swing, abrupt turning, she also reported left hand tremor since 04/16/14,  We have personally reviewed MRI of the orbit in September 2017 with without contrast at [facility], no significant abnormality found, with exception of 2.6 centimeter polypoid left maxillary sinus retention cyst  REVIEW OF SYSTEMS: Full 14 system review of systems performed and notable only for as above  ALLERGIES: Allergies  Allergen Reactions  . Lisinopril Cough  . Penicillins Itching  . Adhesive [Tape] Itching  . Atorvastatin Itching  . Dilaudid [Hydromorphone Hcl] Itching  . Hydromorphone Itching    HOME MEDICATIONS: Current Outpatient Prescriptions  Medication Sig Dispense Refill  . ALPRAZolam (XANAX XR) 0.5 MG 24 hr tablet Take 0.5 mg by mouth 2 (two) times daily as needed for anxiety.    Marland Kitchen aspirin 325 MG tablet Take 325 mg by mouth daily.      . dorzolamide (TRUSOPT) 2 % ophthalmic solution Place 1 drop into the left eye 2 (two) times daily.     . fexofenadine (ALLEGRA) 180 MG tablet Take 180 mg by mouth daily.    Marland Kitchen ipratropium (ATROVENT) 0.06 % nasal spray Place 2 sprays into both nostrils daily.     Marland Kitchen latanoprost (XALATAN) 0.005 % ophthalmic solution Place 1 drop into both eyes at bedtime.    Marland Kitchen lithium carbonate 300 MG capsule Take 300 mg by mouth 2 (two) times daily with a meal.      . losartan (COZAAR) 25 MG tablet Take 25 mg by mouth daily.    . Multiple Vitamin (MULTIVITAMIN) tablet  Take 1 tablet by mouth daily.    Marland Kitchen omeprazole (PRILOSEC) 10 MG capsule Take 10 mg by mouth 2 (two) times daily.    . polyethylene glycol powder (MIRALAX) powder Take 8.5-34 g by mouth 2 (two) times daily as needed for moderate constipation or severe constipation. To correct constipation.  Adjust dose over 1-2 months.  Goal = ~1 bowel movement / day    . simvastatin (ZOCOR) 20 MG tablet Take 20 mg by mouth daily.      No current facility-administered medications for this visit.     PAST MEDICAL  HISTORY: Past Medical History:  Diagnosis Date  . Anxiety   . Cardiac conduction disorder 03/30/2012   Overview:  STORY: ETT 03/09/2012 Echo 03/07/2012 also normal Dr Karilyn Cota: ?sick sinus syndrome.  HR is in the 40's.  Increased fatigue over the last month.  BP is stable   . Diverticulosis of colon   . GIST (gastrointestinal stroma tumor), malignant, colon ([place])   . History of colon polyps 10/24/2008  . Hypertension   . Manic disorder, single episode, in full remission ([place]) 12/01/2009  . Sixth nerve palsy     PAST SURGICAL HISTORY: Past Surgical History:  Procedure Laterality Date  . BILATERAL SALPINGOOPHORECTOMY  09/22/2007  . ILEOCECETOMY  09/22/2007  . OVARIAN CYST REMOVAL Right 1967  . SMALL INTESTINE SURGERY  09/22/2007  . TOTAL VAGINAL HYSTERECTOMY  1986   Fibroids    FAMILY HISTORY: Family History  Problem Relation Age of Onset  . Congestive Heart Failure Mother   . Dementia Mother   . Heart disease Father   . Diabetes Father     SOCIAL HISTORY:  Social History   Social History  . Marital status: Widowed    Spouse name: N/A  . Number of children: 2  . Years of education: Bachelors   Occupational History  . Not on file.   Social History Main Topics  . Smoking status: Never Smoker  . Smokeless tobacco: Never Used  . Alcohol use No  . Drug use: No  . Sexual activity: Not Currently    Birth control/ protection: Post-menopausal   Other Topics Concern  . Not on file   Social History Narrative   Lives at home alone.   Right-handed.   No caffeine use.        PHYSICAL EXAM   Vitals:   10/16/15 1336  BP: 107/64  Pulse: 64  Weight: 145 lb 12 oz (66.1 kg)  Height: 5' 1.5" (1.562 m)    Not recorded      Body mass index is 27.09 kg/m.  PHYSICAL EXAMNIATION:  Gen: NAD, conversant, well nourised, obese, well groomed                     Cardiovascular: Regular rate rhythm, no peripheral edema, warm, nontender. Eyes: Conjunctivae clear  without exudates or hemorrhage Neck: Supple, no carotid bruise. Pulmonary: Clear to auscultation bilaterally   NEUROLOGICAL EXAM:  MENTAL STATUS: Mildly decreased facial expression  Speech:    Speech is normal; fluent and spontaneous with normal comprehension.  Cognition:     Orientation to time, place and person     Normal recent and remote memory     Normal Attention span and concentration     Normal Language, naming, repeating,spontaneous speech     Fund of knowledge   CRANIAL NERVES: CN II: Visual fields are full to confrontation. Fundoscopic exam is normal with sharp discs and no vascular changes. Pupils  are round equal and briskly reactive to light. CN III, IV, VI: extraocular movement are normal. No ptosis. CN V: Facial sensation is intact to pinprick in all 3 divisions bilaterally. Corneal responses are intact.  CN VII: Face is symmetric with normal eye closure and smile. CN VIII: Hearing is normal to rubbing fingers CN IX, X: Palate elevates symmetrically. Phonation is normal. CN XI: Head turning and shoulder shrug are intact CN XII: Tongue is midline with normal movements and no atrophy.  MOTOR: She has mild left hand resting tremor, moderate limb rigidity, bradykinesia, increased with reinforcement maneuver, no weakness   REFLEXES: Reflexes are 2+ and symmetric at the biceps, triceps, knees, and ankles. Plantar responses are flexor.  SENSORY: Intact to light touch, pinprick, positional sensation and vibratory sensation are intact in fingers and toes.  COORDINATION: Rapid alternating movements and fine finger movements are intact. There is no dysmetria on finger-to-nose and heel-knee-shin.    GAIT/STANCE: She tends to lean towards her right side, decreased bilateral arm swing, abruptly turning   DIAGNOSTIC DATA (LABS, IMAGING, TESTING) - I reviewed patient records, labs, notes, testing and imaging myself where available.   ASSESSMENT AND PLAN  Tracey [name]  is a 75 y.o. female   New-onset diplopia since July 2017,  Was diagnosed with left sixth nerve palsy by of ophthalmologist Dr. Leanne Lovely evaluation on September 05 2015  Have personally reviewed MRI of bilateral optic nerve in September 2017, no significant abnormality found  Continue aspirin daily  Laboratory evaluations including acetylcholine binding antibody Mild parkinsonian features  I have asked her to bring her family at next visit    Marcial Pacas, M.D. Ph.D.  [facility] [address], [place], [place] [address] Ph: [phone] Fax: [phone]   CC:  Ophthalmologist Dr. Nathanial Rancher, Bernerd Limbo, MD

## 2015-10-18 LAB — TSH: TSH: 1.91 u[IU]/mL (ref 0.450–4.500)

## 2015-10-18 LAB — ANA W/REFLEX IF POSITIVE: Anti Nuclear Antibody(ANA): NEGATIVE

## 2015-10-18 LAB — MYASTHENIA GRAVIS EVALUATION
AChR Binding Ab, Serum: <0.03 nmol/L (ref 0.00–0.24)
Anti-striation Abs: NEGATIVE

## 2015-10-18 LAB — SEDIMENTATION RATE: Sed Rate: 3 mm/hr (ref 0–40)

## 2015-10-18 LAB — C-REACTIVE PROTEIN: CRP: 3.9 mg/L (ref 0.0–4.9)

## 2015-11-15 ENCOUNTER — Ambulatory Visit (INDEPENDENT_AMBULATORY_CARE_PROVIDER_SITE_OTHER): Payer: Medicare Other | Admitting: Neurology

## 2015-11-15 VITALS — BP 151/74 | HR 51 | Ht 61.5 in | Wt 149.2 lb

## 2015-11-15 DIAGNOSIS — G20 Parkinson's disease: Secondary | ICD-10-CM | POA: Diagnosis not present

## 2015-11-15 DIAGNOSIS — H532 Diplopia: Secondary | ICD-10-CM | POA: Diagnosis not present

## 2015-11-15 DIAGNOSIS — H409 Unspecified glaucoma: Secondary | ICD-10-CM

## 2015-11-15 NOTE — Patient Instructions (Signed)
[email or web address]

## 2015-11-15 NOTE — Progress Notes (Signed)
PATIENT: Tracey [name] DOB: 1940/09/03  Chief Complaint  Patient presents with  . Diplopia    She is still having intermittent vision disturbances that seem to be worsened by positional changes of her head.  Tracey [name] Features    She is here with her son, Tracey [name], to further discuss her current health.     HISTORICAL  Tracey [name] is a 75 years old right-handed female, seen in refer by her ophthalmologist Dr. Nathanial Rancher for evaluation of double vision October second 2017, her primary care physician is Dr. Bernerd Limbo  I reviewed evaluation by Dr. 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, also with diagnosis of primary open angle glaucoma, advanced in right eye, suspected in left eye, history cataract on the left side, the decision was to observe the symptoms, without intervention,  She had a past medical history of HTN, bipolar disorder, has been treated lithium '300mg'$  bid, her mood has been stable, reported a history of right retinal ischemic event in 1998-03-29, with partial right visual block, involving right inferior nasal field, she is a retired Pharmacist, hospital, now make wedding dress, She complains of excessive stress over the past couple years, her husband passed away in March 28, 2013, her older son is dealing with cancer and family issues  In July 2017, while she was on golf field, she noticed binocular double vision when looking straight ahead or to right or left, she also noticed double vision when she watching TV at her left visual field, she enjoy sewing wedding dress, there was no difficulty with near vision. She seems to notice more double vision when looking in the far visual field.  She denies bulbar or limb muscle weakness, in specific, no ptosis, no dysphagia, no gait abnormality.  She denies significant improvement over the past 3 months  On  today's examination, she had mild parkinsonian features, decreased bilateral arm swing, abrupt turning, she also reported left hand tremor since 03-29-2014,  We have personally reviewed MRI of the orbit in September 2017 with without contrast at [facility], no significant abnormality found, with exception of 2.6 centimeter polypoid left maxillary sinus retention cyst  UPDATE Nov 15 2015: She has mild bilateral hand tremor, left is more obvious in the right side, no difficulty sewing, she has no loss of smell, she sleeps ok, woke up sometimes in the middle of night, has to take tylenol pm, she was noted to have slow walking, no orthostatic dizziness, no constipation,  She had a history of bipolar disorder, has been treated with different psychotropic medications, currently taking lithium 300 mg twice a day, Xanax as needed, she is also taking frequent aspirin 325 mg as needed for her joints pain, Reviewed laboratory evaluation in October 2017, negative acetylcholine antibody, ESR, C-reactive protein, ANA, TSH,  She continue has mild double vision looking to the left side only   REVIEW OF SYSTEMS: Full 14 system review of systems performed and notable only for as above  ALLERGIES: Allergies  Allergen Reactions  . Lisinopril Cough  . Penicillins Itching  . Adhesive [Tape] Itching  . Atorvastatin Itching  . Dilaudid [Hydromorphone Hcl] Itching  . Hydromorphone Itching    HOME MEDICATIONS: Current Outpatient Prescriptions  Medication Sig Dispense Refill  . ALPRAZolam (XANAX XR) 0.5 MG 24 hr tablet Take 0.5 mg by mouth 2 (two) times  daily as needed for anxiety.    Marland Kitchen aspirin 325 MG tablet Take 325 mg by mouth daily.      . dorzolamide (TRUSOPT) 2 % ophthalmic solution Place 1 drop into the left eye 2 (two) times daily.     . fexofenadine (ALLEGRA) 180 MG tablet Take 180 mg by mouth daily.    Marland Kitchen ipratropium (ATROVENT) 0.06 % nasal spray Place 2 sprays into both nostrils daily.     Marland Kitchen latanoprost  (XALATAN) 0.005 % ophthalmic solution Place 1 drop into both eyes at bedtime.    Marland Kitchen lithium carbonate 300 MG capsule Take 300 mg by mouth 2 (two) times daily with a meal.      . losartan (COZAAR) 25 MG tablet Take 25 mg by mouth daily.    . Multiple Vitamin (MULTIVITAMIN) tablet Take 1 tablet by mouth daily.    Marland Kitchen omeprazole (PRILOSEC) 10 MG capsule Take 10 mg by mouth 2 (two) times daily.    . polyethylene glycol powder (MIRALAX) powder Take 8.5-34 g by mouth 2 (two) times daily as needed for moderate constipation or severe constipation. To correct constipation.  Adjust dose over 1-2 months.  Goal = ~1 bowel movement / day    . simvastatin (ZOCOR) 20 MG tablet Take 20 mg by mouth daily.      No current facility-administered medications for this visit.     PAST MEDICAL HISTORY: Past Medical History:  Diagnosis Date  . Anxiety   . Cardiac conduction disorder 03/30/2012   Overview:  STORY: ETT 03/09/2012 Echo 03/07/2012 also normal Dr Karilyn Cota: ?sick sinus syndrome.  HR is in the 40's.  Increased fatigue over the last month.  BP is stable   . Diverticulosis of colon   . 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 History of colon polyps 10/24/2008  . Hypertension   . Manic disorder, single episode, in full remission ([place]) 12/01/2009  . Sixth nerve palsy     PAST SURGICAL HISTORY: Past Surgical History:  Procedure Laterality Date  . BILATERAL SALPINGOOPHORECTOMY  09/22/2007  . ILEOCECETOMY  09/22/2007  . OVARIAN CYST REMOVAL Right 1967  . SMALL INTESTINE SURGERY  09/22/2007  . TOTAL VAGINAL HYSTERECTOMY  1986   Fibroids    FAMILY HISTORY: Family History  Problem Relation Age of Onset  . Congestive Heart Failure Mother   . Dementia Mother   . Heart disease Father   . Diabetes Father     SOCIAL HISTORY:  Social History   Social History  . Marital status: Widowed    Spouse name: N/A  . Number of children: 2  . Years of education: Bachelors    Occupational History  . Not on file.   Social History Main Topics  . Smoking status: Never Smoker  . Smokeless tobacco: Never Used  . Alcohol use No  . Drug use: No  . Sexual activity: Not Currently    Birth control/ protection: Post-menopausal   Other Topics Concern  . Not on file   Social History Narrative   Lives at home alone.   Right-handed.   No caffeine use.        PHYSICAL EXAM   There were no vitals filed for this visit.  Not recorded      There is no height or weight on file to calculate BMI.  PHYSICAL EXAMNIATION:  Gen: NAD, conversant, well nourised, obese, well groomed  Cardiovascular: Regular rate rhythm, no peripheral edema, warm, nontender. Eyes: Conjunctivae clear without exudates or hemorrhage Neck: Supple, no carotid bruise. Pulmonary: Clear to auscultation bilaterally   NEUROLOGICAL EXAM:  MENTAL STATUS: Mildly decreased facial expression  Speech:    Speech is normal; fluent and spontaneous with normal comprehension.  Cognition:     Orientation to time, place and person     Normal recent and remote memory     Normal Attention span and concentration     Normal Language, naming, repeating,spontaneous speech     Fund of knowledge   CRANIAL NERVES: CN II: Visual fields are full to confrontation. Fundoscopic exam is normal with sharp discs and no vascular changes. Pupils are round equal and briskly reactive to light. CN III, IV, VI: extraocular movement are normal. No ptosis. Red lens test demonstrates mild left lateral rectus weakness  CN V: Facial sensation is intact to pinprick in all 3 divisions bilaterally. Corneal responses are intact.  CN VII: Face is symmetric with normal eye closure and smile. CN VIII: Hearing is normal to rubbing fingers CN IX, X: Palate elevates symmetrically. Phonation is normal. CN XI: Head turning and shoulder shrug are intact CN XII: Tongue is midline with normal movements and no  atrophy.  MOTOR: She has mild left hand resting tremor, moderate limb rigidity, bradykinesia, increased with reinforcement maneuver, no weakness   REFLEXES: Reflexes are 2+ and symmetric at the biceps, triceps, knees, and ankles. Plantar responses are flexor.  SENSORY: Intact to light touch, pinprick, positional sensation and vibratory sensation are intact in fingers and toes.  COORDINATION: Rapid alternating movements and fine finger movements are intact. There is no dysmetria on finger-to-nose and heel-knee-shin.    GAIT/STANCE: She tends to lean towards her right side, decreased bilateral arm swing, abruptly turning   DIAGNOSTIC DATA (LABS, IMAGING, TESTING) - I reviewed patient records, labs, notes, testing and imaging myself where available.   ASSESSMENT AND PLAN  Tracey [name] is a 75 y.o. female    New-onset diplopia since July 2017,  Was diagnosed with left sixth nerve palsy by of ophthalmologist Dr. Leanne Lovely evaluation on September 05 2015  Remain mildly symptomatic  Have personally reviewed MRI of bilateral optic nerve in September 2017, no significant abnormality found  Continue aspirin daily  Mild parkinsonian features  This could be potentially secondary due to long-term psychotropic medication treatment,  She is only mild symptomatic, no limitation on her daily activity,  I have encouraged her continue moderate exercise,  Return to clinic in one year    Marcial Pacas, M.D. Ph.D.  [facility] [address] [place], [place] [address] Ph: [phone] Fax: [phone]   CC:  Ophthalmologist Dr. Nathanial Rancher, Bernerd Limbo, MD

## 2016-11-19 ENCOUNTER — Ambulatory Visit: Payer: Medicare Other | Admitting: Neurology

## 2018-01-29 ENCOUNTER — Other Ambulatory Visit: Payer: Self-pay

## 2018-01-29 ENCOUNTER — Ambulatory Visit: Payer: Medicare Other | Attending: Family Medicine | Admitting: Physical Therapy

## 2018-01-29 DIAGNOSIS — M542 Cervicalgia: Secondary | ICD-10-CM

## 2018-01-29 NOTE — Patient Instructions (Signed)
Access Code: [email or web address]  URL: [email or web address]  Date: 01/29/2018  Prepared by: Almyra Free Foday Cone   Exercises  Supine Cervical Rotation AROM on Pillow - 10 reps - 3 sets - 2x daily - 7x weekly  Supine Cervical Retraction with Towel - 10 reps - 1 sets - 5 seconds hold - 2x daily - 7x weekly  Thoracic Extension Mobilization on Foam Roll - 10 reps - 3 sets - 1x daily - 7x weekly

## 2018-01-29 NOTE — Therapy (Signed)
[address] [place], [place], [address] Phone: [phone]   Fax:  [phone]  Physical Therapy Evaluation  Patient Details  Name: Tracey [name] MRN: [id number] Date of Birth: [birth year] Referring Provider (PT): Bernerd Limbo   Encounter Date: 01/29/2018  PT End of Session - 01/29/18 1255    Visit Number  1    Date for PT Re-Evaluation  03/26/18    Authorization Type  MCR add KX    PT Start Time  1300    PT Stop Time  1346    PT Time Calculation (min)  46 min    Activity Tolerance  Patient tolerated treatment well;Patient limited by pain    Behavior During Therapy  [facility] for tasks assessed/performed       Past Medical History:  Diagnosis Date  . Anxiety   . Cardiac conduction disorder 03/30/2012   Overview:  STORY: ETT 03/09/2012 Echo 03/07/2012 also normal Dr Karilyn Cota: ?sick sinus syndrome.  HR is in the 40's.  Increased fatigue over the last month.  BP is stable   . Diverticulosis of colon   . 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 History of colon polyps 10/24/2008  . Hypertension   . Manic disorder, single episode, in full remission ([place]) 12/01/2009  . Sixth nerve palsy     Past Surgical History:  Procedure Laterality Date  . BILATERAL SALPINGOOPHORECTOMY  09/22/2007  . ILEOCECETOMY  09/22/2007  . OVARIAN CYST REMOVAL Right 1967  . SMALL INTESTINE SURGERY  09/22/2007  . TOTAL VAGINAL HYSTERECTOMY  1986   Fibroids    There were no vitals filed for this visit.   Subjective Assessment - 01/29/18 1307    Subjective  Patient reports that for about two years her neck has been bothering her. She was treated for double vision and had to turn her head a lot. She now has difficulty turning her head to check her blind spots.  Turning her neck hurts and she can hear crepitis.    Pertinent History  HTN, intestinal CA (no longer), HTN, arthritis    Diagnostic tests   xrays - arthritis    Patient Stated Goals  turn her head without pain    Currently in Pain?  Yes    Pain Score  9     Pain Location  Neck    Pain Orientation  Right;Left    Pain Descriptors / Indicators  Squeezing    Pain Type  Chronic pain    Pain Onset  More than a month ago    Pain Frequency  Intermittent    Aggravating Factors   turning and looking down    Pain Relieving Factors  heat a little    Effect of Pain on Daily Activities  dangerous driving         [facility] PT Assessment - 01/29/18 0001      Assessment   Medical Diagnosis  neck pain    Referring Provider (PT)  Bernerd Limbo    Onset Date/Surgical Date  01/15/16    Hand Dominance  Right    Prior Therapy  none      Precautions   Precautions  None      Balance Screen   Has the patient fallen in the past 6 months  No    Has the patient had a decrease in activity level because of a fear of falling?   No    Is the  patient reluctant to leave their home because of a fear of falling?   No      Home Film/video editor residence    Additional Comments  widow and son just passed fairly recently      Prior Function   Level of Independence  Independent    Vocation  Retired    Leisure  water aerobics      ROM / Strength   AROM / PROM / Strength  AROM;Strength      AROM   Overall AROM Comments  shoulder flex R 132 and left 130 deg    AROM Assessment Site  Cervical    Cervical Flexion  25    Cervical Extension  17    Cervical - Right Side Bend  13    Cervical - Left Side Bend  2    Cervical - Right Rotation  35    Cervical - Left Rotation  39   feels pull into left arm and hand     Strength   Overall Strength Comments  cervical ext 4-/5, else 5/5      Palpation   Spinal mobility  decreased PA mobility appears mainly due to tight muscles    Palpation comment  increased tone in bil UT and cervical paraspinals       Special Tests    Special Tests  Cervical    Cervical Tests   Spurling's;Dictraction      Spurling's   Findings  Negative    Side  --   bil     Distraction Test   Findngs  Negative                Objective measurements completed on examination: See above findings.              PT Education - 01/29/18 1532    Education Details  HEP    Person(s) Educated  Patient    Methods  Explanation;Demonstration;Handout    Comprehension  Verbalized understanding;Returned demonstration       PT Short Term Goals - 01/29/18 1537      PT SHORT TERM GOAL #1   Title  Ind with initial HEP     Time  2    Period  Weeks    Status  New    Target Date  02/12/18      PT SHORT TERM GOAL #2   Title  increased cervical rotation by 10 deg bil to ease ADLS    Time  4    Status  New    Target Date  02/26/18        PT Long Term Goals - 01/29/18 1538      PT LONG TERM GOAL #1   Title  Improved cervical rotation to 55 deg or more to improve safety while driving.    Time  8    Period  Weeks    Target Date  03/26/18      PT LONG TERM GOAL #2   Title  Decreased pain with cervical motion by 75% or more.    Time  8    Period  Weeks    Status  New      PT LONG TERM GOAL #3   Title  Improved cervical SB to 30 degrees or more to normalize ADLS    Time  8    Period  Weeks    Status  New      PT LONG TERM  GOAL #4   Title  Patient able to verbalize techniques and demonstrate proper posture to prevent further injury to neck.    Time  8    Period  Weeks    Status  New      PT LONG TERM GOAL #5   Title  improved cervical extensor strength to 4+/5 to help maintain normal posture.    Time  8    Period  Weeks    Status  New             Plan - 01/29/18 1531    Clinical Impression Statement  Patient present with a two-year h/o of neck pain that has progressively worsened. She has marked limitations in ROM affecting ADLS including checking her blind spots while driving. She has marked tightness in bil UT and cervical muscles likely  due to forward head and rounded shoulder posturing. She also has tight pecs and weakness in her cervical extensors.     History and Personal Factors relevant to plan of care:  HTN, intestinal CA (no longer), HTN, arthritis; Recent loss of husband and son in past five years.    Clinical Presentation  Stable    Clinical Decision Making  Low    Rehab Potential  Good    PT Frequency  2x / week    PT Duration  8 weeks    PT Treatment/Interventions  ADLs/Self Care Home Management;Cryotherapy;Electrical Stimulation;Moist Heat;Traction;Ultrasound;Therapeutic exercise;Patient/family education;Neuromuscular re-education;Manual techniques;Dry needling;Taping    PT Next Visit Plan  STW to cspine and UTs; postural and cervical extensor strengthening; may try traction    PT Home Exercise Plan  [email or web address]    Consulted and Agree with Plan of Care  Patient       Patient will benefit from skilled therapeutic intervention in order to improve the following deficits and impairments:  Pain, Postural dysfunction, Decreased range of motion, Decreased strength, Hypomobility, Impaired flexibility  Visit Diagnosis: Cervicalgia - Plan: PT plan of care cert/re-cert     Problem List Patient Active Problem List   Diagnosis Date Noted  . Parkinsonism ([place]) 11/15/2015  . Diplopia 10/16/2015  . SBO (small bowel obstruction) ([place]) 09/18/2013  . Glaucoma 09/18/2013  . GERD (gastroesophageal reflux disease) 09/18/2013  . HLD (hyperlipidemia) 02/21/2013  . Essential (primary) hypertension 02/21/2013  . Barrett esophagus 02/19/2013  . Cardiac conduction disorder 03/30/2012  . Difficulty hearing 08/20/2011  . 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 Allergic rhinitis 09/14/2010  . Anxiety state 12/22/2009  . Bipolar I disorder, single manic episode, in full remission ([place]) 12/01/2009  . 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 History  of colon polyps 10/24/2008  . Arthritis, degenerative 10/24/2008    [name] PT 01/29/2018, 3:46 PM  [address] [place], [place], [address] Phone: [phone]   Fax:  [phone]  Name: Tracey [name] MRN: [id number] Date of Birth: September 18, 1940

## 2018-02-02 ENCOUNTER — Ambulatory Visit: Payer: Medicare Other | Admitting: Physical Therapy

## 2018-02-04 ENCOUNTER — Ambulatory Visit: Payer: Medicare Other | Admitting: Physical Therapy

## 2018-02-04 DIAGNOSIS — M542 Cervicalgia: Secondary | ICD-10-CM | POA: Diagnosis not present

## 2018-02-04 NOTE — Therapy (Signed)
[address] [place], [place], [address] Phone: [phone]   Fax:  [phone]  Physical Therapy Treatment  Patient Details  Name: Tracey [name] MRN: [id number] Date of Birth: 1940/03/21 Referring Provider (PT): Bernerd Limbo   Encounter Date: 02/04/2018  PT End of Session - 02/04/18 1601    Visit Number  2    Date for PT Re-Evaluation  03/26/18    Authorization Type  MCR add KX    PT Start Time  1530    PT Stop Time  1620    PT Time Calculation (min)  50 min       Past Medical History:  Diagnosis Date  . Anxiety   . Cardiac conduction disorder 03/30/2012   Overview:  STORY: ETT 03/09/2012 Echo 03/07/2012 also normal Dr Karilyn Cota: ?sick sinus syndrome.  HR is in the 40's.  Increased fatigue over the last month.  BP is stable   . Diverticulosis of colon   . GIST (gastrointestinal stroma tumor), malignant, colon ([place])   . History of colon polyps 10/24/2008  . Hypertension   . Manic disorder, single episode, in full remission ([place]) 12/01/2009  . Sixth nerve palsy     Past Surgical History:  Procedure Laterality Date  . BILATERAL SALPINGOOPHORECTOMY  09/22/2007  . ILEOCECETOMY  09/22/2007  . OVARIAN CYST REMOVAL Right 1967  . SMALL INTESTINE SURGERY  09/22/2007  . TOTAL VAGINAL HYSTERECTOMY  1986   Fibroids    There were no vitals filed for this visit.  Subjective Assessment - 02/04/18 1534    Subjective  doing better, ex at home are helping    Currently in Pain?  Yes    Pain Score  4     Pain Location  Neck    Pain Orientation  Right;Left                       [facility] Adult PT Treatment/Exercise - 02/04/18 0001      Exercises   Exercises  Neck;Shoulder      Neck Exercises: Machines for Strengthening   UBE (Upper Arm Bike)  L 3 2 fwd/2 back      Neck Exercises: Theraband   Scapula Retraction  15 reps   tellow   Shoulder Extension  15 reps   yellow   Shoulder  ADduction  15 reps   yellow   Shoulder External Rotation  15 reps   yellow     Neck Exercises: Standing   Neck Retraction  15 reps;3 secs   head on ball     Modalities   Modalities  Electrical Stimulation;Moist Heat      Moist Heat Therapy   Number Minutes Moist Heat  15 Minutes    Moist Heat Location  Cervical      Electrical Stimulation   Electrical Stimulation Location  cerv    Electrical Stimulation Action  IFC    Electrical Stimulation Parameters  seated    Electrical Stimulation Goals  Pain   tightness     Manual Therapy   Manual Therapy  Soft tissue mobilization;Myofascial release;Passive ROM    Manual therapy comments  very tight and guarderd    Soft tissue mobilization  cerv/trap/rhom    Myofascial Release  cerv/trap/rhom    Passive ROM  gentle in all directions as tolerated             PT Education - 02/04/18 1601    Education  Details  reviewed HEP from eval and added cerv retraction witrh ball on wall    Person(s) Educated  Patient    Methods  Explanation;Demonstration    Comprehension  Verbalized understanding;Returned demonstration       PT Short Term Goals - 02/04/18 1602      PT SHORT TERM GOAL #1   Title  Ind with initial HEP     Status  Achieved        PT Long Term Goals - 01/29/18 1538      PT LONG TERM GOAL #1   Title  Improved cervical rotation to 55 deg or more to improve safety while driving.    Time  8    Period  Weeks    Target Date  03/26/18      PT LONG TERM GOAL #2   Title  Decreased pain with cervical motion by 75% or more.    Time  8    Period  Weeks    Status  New      PT LONG TERM GOAL #3   Title  Improved cervical SB to 30 degrees or more to normalize ADLS    Time  8    Period  Weeks    Status  New      PT LONG TERM GOAL #4   Title  Patient able to verbalize techniques and demonstrate proper posture to prevent further injury to neck.    Time  8    Period  Weeks    Status  New      PT LONG TERM GOAL #5    Title  improved cervical extensor strength to 4+/5 to help maintain normal posture.    Time  8    Period  Weeks    Status  New            Plan - 02/04/18 1602    Clinical Impression Statement  STG for HEP met. Initiated ther ex and focus on MT to increase cerv ROM and tissue elasticity.    PT Treatment/Interventions  ADLs/Self Care Home Management;Cryotherapy;Electrical Stimulation;Moist Heat;Traction;Ultrasound;Therapeutic exercise;Patient/family education;Neuromuscular re-education;Manual techniques;Dry needling;Taping    PT Next Visit Plan  STW to cspine and UTs; postural and cervical extensor strengthening; may try traction       Patient will benefit from skilled therapeutic intervention in order to improve the following deficits and impairments:  Pain, Postural dysfunction, Decreased range of motion, Decreased strength, Hypomobility, Impaired flexibility  Visit Diagnosis: Cervicalgia     Problem List Patient Active Problem List   Diagnosis Date Noted  . Parkinsonism ([place]) 11/15/2015  . Diplopia 10/16/2015  . SBO (small bowel obstruction) ([place]) 09/18/2013  . Glaucoma 09/18/2013  . GERD (gastroesophageal reflux disease) 09/18/2013  . HLD (hyperlipidemia) 02/21/2013  . Essential (primary) hypertension 02/21/2013  . Barrett esophagus 02/19/2013  . Cardiac conduction disorder 03/30/2012  . Difficulty hearing 08/20/2011  . 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 Allergic rhinitis 09/14/2010  . Anxiety state 12/22/2009  . Bipolar I disorder, single manic episode, in full remission ([place]) 12/01/2009  . 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 History of colon polyps 10/24/2008  . Arthritis, degenerative 10/24/2008    Aliena Ghrist,ANGIE PTA 02/04/2018, 4:03 PM  [address] [place], [place], [address] Phone:  [phone]   Fax:  [phone]  Name: AMADI YOSHINO MRN: [id number] Date of Birth: 1940-01-30

## 2018-02-06 ENCOUNTER — Ambulatory Visit: Payer: Medicare Other | Admitting: Physical Therapy

## 2018-02-06 DIAGNOSIS — M542 Cervicalgia: Secondary | ICD-10-CM

## 2018-02-06 NOTE — Patient Instructions (Signed)
Access Code: [email or web address]  URL: [email or web address]  Date: 02/06/2018  Prepared by: Almyra Free Aara Jacquot   Exercises  Supine Cervical Rotation AROM on Pillow - 10 reps - 3 sets - 2x daily - 7x weekly  Supine Cervical Retraction with Towel - 10 reps - 1 sets - 5 seconds hold - 2x daily - 7x weekly  Thoracic Extension Mobilization on Foam Roll - 10 reps - 3 sets - 1x daily - 7x weekly  Sternocleidomastoid Stretch - 3 reps - 1 sets - 30-60 seconds hold - 3x daily - 7x weekly  Doorway Pec Stretch at 120 Degrees Abduction - 3 reps - 1 sets - 30-60 sec hold - 2x daily - 7x weekly

## 2018-02-06 NOTE — Therapy (Signed)
[address] [place], [place], [address] Phone: [phone]   Fax:  [phone]  Physical Therapy Treatment  Patient Details  Name: Tracey [name] MRN: [id number] Date of Birth: 1940/07/08 Referring Provider (PT): Bernerd Limbo   Encounter Date: 02/06/2018  PT End of Session - 02/06/18 1059    Visit Number  3    Date for PT Re-Evaluation  03/26/18    Authorization Type  MCR add KX    PT Start Time  1059    PT Stop Time  1210    PT Time Calculation (min)  71 min    Activity Tolerance  Patient tolerated treatment well;Patient limited by pain    Behavior During Therapy  [facility] for tasks assessed/performed       Past Medical History:  Diagnosis Date  . Anxiety   . Cardiac conduction disorder 03/30/2012   Overview:  STORY: ETT 03/09/2012 Echo 03/07/2012 also normal Dr Karilyn Cota: ?sick sinus syndrome.  HR is in the 40's.  Increased fatigue over the last month.  BP is stable   . Diverticulosis of colon   . GIST (gastrointestinal stroma tumor), malignant, colon ([place])   . History of colon polyps 10/24/2008  . Hypertension   . Manic disorder, single episode, in full remission ([place]) 12/01/2009  . Sixth nerve palsy     Past Surgical History:  Procedure Laterality Date  . BILATERAL SALPINGOOPHORECTOMY  09/22/2007  . ILEOCECETOMY  09/22/2007  . OVARIAN CYST REMOVAL Right 1967  . SMALL INTESTINE SURGERY  09/22/2007  . TOTAL VAGINAL HYSTERECTOMY  1986   Fibroids    There were no vitals filed for this visit.  Subjective Assessment - 02/06/18 1059    Subjective  my neck is feeling better    Pertinent History  HTN, intestinal CA (no longer), HTN, arthritis    Diagnostic tests  xrays - arthritis    Patient Stated Goals  turn her head without pain    Currently in Pain?  Yes    Pain Score  7    with turning; no pain when still   Pain Location  Neck    Pain Orientation  Right;Left    Pain Descriptors /  Indicators  Squeezing                       [facility] Adult PT Treatment/Exercise - 02/06/18 0001      Neck Exercises: Machines for Strengthening   UBE (Upper Arm Bike)  L 3 2 fwd/2 back      Neck Exercises: Theraband   Scapula Retraction  15 reps   tellow   Shoulder Extension  15 reps   yellow   Shoulder External Rotation  20 reps   yellow   Horizontal ABduction  20 reps   yellow     Neck Exercises: Standing   Neck Retraction  3 secs;20 reps   WITH YELLOW BAND     Shoulder Exercises: Supine   Other Supine Exercises  thoracic rotation with ball x 10 bil; extension over towel 2x5      Shoulder Exercises: Stretch   Corner Stretch  2 reps;30 seconds   DOORWAY   Other Shoulder Stretches  ant neck stretch 1x30 sec bil      Modalities   Modalities  Electrical Stimulation;Moist Heat      Moist Heat Therapy   Number Minutes Moist Heat  15 Minutes    Moist Heat Location  Cervical      Engineer, technical sales Action  premod    Electrical Stimulation Parameters  supine    Electrical Stimulation Goals  Pain      Manual Therapy   Manual Therapy  Soft tissue mobilization;Joint mobilization    Joint Mobilization  O/A, A/A, and lower spine     Soft tissue mobilization  cerv paraspinals    Myofascial Release  suboccipital release    Passive ROM  into rotation; seated traction with rotation x 5 each way             PT Education - 02/06/18 1203    Education Details  HEP    Person(s) Educated  Patient    Methods  Explanation;Handout;Demonstration    Comprehension  Verbalized understanding;Returned demonstration       PT Short Term Goals - 02/04/18 1602      PT SHORT TERM GOAL #1   Title  Ind with initial HEP     Status  Achieved        PT Long Term Goals - 02/06/18 1158      PT LONG TERM GOAL #1   Title  Improved cervical rotation to 55 deg or more to improve safety while driving.     Time  8    Period  Weeks    Status  On-going      PT LONG TERM GOAL #2   Title  Decreased pain with cervical motion by 75% or more.    Time  8    Period  Weeks    Status  On-going      PT LONG TERM GOAL #3   Title  Improved cervical SB to 30 degrees or more to normalize ADLS    Time  8    Period  Weeks    Status  On-going            Plan - 02/06/18 1159    Clinical Impression Statement  Patient reports small improvments in rotation, but pain at end range is still high. She responded well to cervical mobs and traction with rotation.     Rehab Potential  Good    PT Frequency  2x / week    PT Duration  8 weeks    PT Treatment/Interventions  ADLs/Self Care Home Management;Cryotherapy;Electrical Stimulation;Moist Heat;Traction;Ultrasound;Therapeutic exercise;Patient/family education;Neuromuscular re-education;Manual techniques;Dry needling;Taping    PT Next Visit Plan  try [place] to cspine and static traction    PT Home Exercise Plan  [email or web address]       Patient will benefit from skilled therapeutic intervention in order to improve the following deficits and impairments:  Pain, Postural dysfunction, Decreased range of motion, Decreased strength, Hypomobility, Impaired flexibility  Visit Diagnosis: Cervicalgia     Problem List Patient Active Problem List   Diagnosis Date Noted  . Parkinsonism ([place]) 11/15/2015  . Diplopia 10/16/2015  . SBO (small bowel obstruction) ([place]) 09/18/2013  . Glaucoma 09/18/2013  . GERD (gastroesophageal reflux disease) 09/18/2013  . HLD (hyperlipidemia) 02/21/2013  . Essential (primary) hypertension 02/21/2013  . Barrett esophagus 02/19/2013  . Cardiac conduction disorder 03/30/2012  . Difficulty hearing 08/20/2011  . 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 Allergic rhinitis 09/14/2010  . Anxiety state 12/22/2009  . Bipolar I disorder, single manic episode, in full  remission ([place]) 12/01/2009  . 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 History of colon  polyps 10/24/2008  . Arthritis, degenerative 10/24/2008    [name] PT 02/06/2018, 12:04 PM  [address] [place], [place], [address] Phone: [phone]   Fax:  [phone]  Name: MECCA GUITRON MRN: [id number] Date of Birth: November 08, 1940

## 2018-02-09 ENCOUNTER — Ambulatory Visit: Payer: Medicare Other | Admitting: Physical Therapy

## 2018-02-09 DIAGNOSIS — M542 Cervicalgia: Secondary | ICD-10-CM | POA: Diagnosis not present

## 2018-02-09 NOTE — Therapy (Signed)
[address] [place], [place], [address] Phone: [phone]   Fax:  [phone]  Physical Therapy Treatment  Patient Details  Name: Tracey [name] MRN: [id number] Date of Birth: [birth year] Referring Provider (PT): Bernerd Limbo   Encounter Date: 02/09/2018  PT End of Session - 02/09/18 1346    Visit Number  4    Date for PT Re-Evaluation  03/26/18    Authorization Type  MCR add KX    PT Start Time  1346    PT Stop Time  1700    PT Time Calculation (min)  51 min    Activity Tolerance  Patient tolerated treatment well;Patient limited by pain    Behavior During Therapy  [facility] for tasks assessed/performed       Past Medical History:  Diagnosis Date  . Anxiety   . Cardiac conduction disorder 03/30/2012   Overview:  STORY: ETT 03/09/2012 Echo 03/07/2012 also normal Dr Karilyn Cota: ?sick sinus syndrome.  HR is in the 40's.  Increased fatigue over the last month.  BP is stable   . Diverticulosis of colon   . GIST (gastrointestinal stroma tumor), malignant, colon ([place])   . History of colon polyps 10/24/2008  . Hypertension   . Manic disorder, single episode, in full remission ([place]) 12/01/2009  . Sixth nerve palsy     Past Surgical History:  Procedure Laterality Date  . BILATERAL SALPINGOOPHORECTOMY  09/22/2007  . ILEOCECETOMY  09/22/2007  . OVARIAN CYST REMOVAL Right 1967  . SMALL INTESTINE SURGERY  09/22/2007  . TOTAL VAGINAL HYSTERECTOMY  1986   Fibroids    There were no vitals filed for this visit.  Subjective Assessment - 02/09/18 1346    Subjective  I like the neck stretch    Pertinent History  HTN, intestinal CA (no longer), HTN, arthritis    Diagnostic tests  xrays - arthritis    Patient Stated Goals  turn her head without pain    Currently in Pain?  Yes    Pain Score  6     Pain Location  Neck    Pain Orientation  Left    Pain Descriptors / Indicators  Squeezing                        [facility] Adult PT Treatment/Exercise - 02/09/18 0001      Neck Exercises: Standing   Neck Retraction  3 secs;20 reps   WITH YELLOW BAND   Upper Extremity D1  Extension;10 reps      Shoulder Exercises: Seated   Other Seated Exercises  rotation x 5 each way; chops x 5 each way; overhead reach x 10 all with head leading motion      Modalities   Modalities  Traction;Ultrasound      Ultrasound   Ultrasound Location  cervical    Ultrasound Parameters  1.5 w/cm2 3 mhz cont x 10 min    Ultrasound Goals  Pain      Traction   Type of Traction  Cervical    Max (lbs)  10    Time  10 min      Manual Therapy   Manual Therapy  Soft tissue mobilization    Soft tissue mobilization  deep to bil UTs and levator in sitting               PT Short Term Goals - 02/04/18 1602      PT  SHORT TERM GOAL #1   Title  Ind with initial HEP     Status  Achieved        PT Long Term Goals - 02/06/18 1158      PT LONG TERM GOAL #1   Title  Improved cervical rotation to 55 deg or more to improve safety while driving.    Time  8    Period  Weeks    Status  On-going      PT LONG TERM GOAL #2   Title  Decreased pain with cervical motion by 75% or more.    Time  8    Period  Weeks    Status  On-going      PT LONG TERM GOAL #3   Title  Improved cervical SB to 30 degrees or more to normalize ADLS    Time  8    Period  Weeks    Status  On-going            Plan - 02/09/18 1432    Clinical Impression Statement  Patient did fairly well with multiplanar exercises although neck pain still limits her range. She responded well to [place] and deep tissue work to [employer] and levator and may responde well to DN in the future as many TPs were found. Normal response to static traction.    PT Treatment/Interventions  ADLs/Self Care Home Management;Cryotherapy;Electrical Stimulation;Moist Heat;Traction;Ultrasound;Therapeutic exercise;Patient/family education;Neuromuscular  re-education;Manual techniques;Dry needling;Taping    PT Next Visit Plan  continue [place] if beneficial; DN to UT/levator; assess traction       Patient will benefit from skilled therapeutic intervention in order to improve the following deficits and impairments:  Pain, Postural dysfunction, Decreased range of motion, Decreased strength, Hypomobility, Impaired flexibility  Visit Diagnosis: Cervicalgia     Problem List Patient Active Problem List   Diagnosis Date Noted  . Parkinsonism ([place]) 11/15/2015  . Diplopia 10/16/2015  . SBO (small bowel obstruction) ([place]) 09/18/2013  . Glaucoma 09/18/2013  . GERD (gastroesophageal reflux disease) 09/18/2013  . HLD (hyperlipidemia) 02/21/2013  . Essential (primary) hypertension 02/21/2013  . Barrett esophagus 02/19/2013  . Cardiac conduction disorder 03/30/2012  . Difficulty hearing 08/20/2011  . 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 Allergic rhinitis 09/14/2010  . Anxiety state 12/22/2009  . Bipolar I disorder, single manic episode, in full remission ([place]) 12/01/2009  . 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 History of colon polyps 10/24/2008  . Arthritis, degenerative 10/24/2008    [name] PT 02/09/2018, 3:33 PM  [address] [place], [place], [address] Phone: [phone]   Fax:  [phone]  Name: Tracey [name] MRN: [id number] Date of Birth: [birth year]

## 2018-02-12 ENCOUNTER — Ambulatory Visit: Payer: Medicare Other | Admitting: Physical Therapy

## 2018-02-12 DIAGNOSIS — M542 Cervicalgia: Secondary | ICD-10-CM

## 2018-02-12 NOTE — Therapy (Signed)
[place] [place], [place], [address] Phone: [phone]   Fax:  [phone]  Physical Therapy Treatment  Patient Details  Name: Tracey [name] MRN: [id number] Date of Birth: 18-Apr-1940 Referring Provider (PT): Bernerd Limbo   Encounter Date: 02/12/2018  PT End of Session - 02/12/18 1347    Visit Number  5    Date for PT Re-Evaluation  03/26/18    Authorization Type  MCR add KX    PT Start Time  1347    PT Stop Time  1445    PT Time Calculation (min)  58 min    Activity Tolerance  Patient tolerated treatment well;Patient limited by pain    Behavior During Therapy  [facility] for tasks assessed/performed       Past Medical History:  Diagnosis Date  . Anxiety   . Cardiac conduction disorder 03/30/2012   Overview:  STORY: ETT 03/09/2012 Echo 03/07/2012 also normal Dr Karilyn Cota: ?sick sinus syndrome.  HR is in the 40's.  Increased fatigue over the last month.  BP is stable   . Diverticulosis of colon   . GIST (gastrointestinal stroma tumor), malignant, colon ([place])   . History of colon polyps 10/24/2008  . Hypertension   . Manic disorder, single episode, in full remission ([place]) 12/01/2009  . Sixth nerve palsy     Past Surgical History:  Procedure Laterality Date  . BILATERAL SALPINGOOPHORECTOMY  09/22/2007  . ILEOCECETOMY  09/22/2007  . OVARIAN CYST REMOVAL Right 1967  . SMALL INTESTINE SURGERY  09/22/2007  . TOTAL VAGINAL HYSTERECTOMY  1986   Fibroids    There were no vitals filed for this visit.      [facility] PT Assessment - 02/12/18 0001      AROM   Cervical - Right Side Bend  2    Cervical - Left Side Bend  15   IE ROM was switched left and right   Cervical - Right Rotation  27    Cervical - Left Rotation  40                   [facility] Adult PT Treatment/Exercise - 02/12/18 0001      Neck Exercises: Machines for Strengthening   UBE (Upper Arm Bike)  L3 2 fwd/2 bwd      Shoulder Exercises: Seated   Other Seated Exercises  rotation x 10 each way; chops x 10 each way; overhead reach x 10 all with head leading motion      Shoulder Exercises: Standing   Extension  Strengthening;Both;15 reps;Theraband    Theraband Level (Shoulder Extension)  Level 1 (Yellow)    Extension Limitations  limited ROM; did without band with PT assist also    Row  Strengthening;20 reps;Theraband    Theraband Level (Shoulder Row)  Level 1 (Yellow)      Modalities   Modalities  Electrical Stimulation;Moist Heat      Moist Heat Therapy   Number Minutes Moist Heat  15 Minutes    Moist Heat Location  Cervical      Electrical Stimulation   Electrical Stimulation Location  cervical    Electrical Stimulation Action  premod    Electrical Stimulation Parameters  supine    Electrical Stimulation Goals  Pain      Manual Therapy   Manual Therapy  Soft tissue mobilization;Joint mobilization    Manual therapy comments  subocc release; manual traction    Joint Mobilization  O/A,  A/A, PA mobs to c2-5 bil    Soft tissue mobilization  to cervical paraspinals       Trigger Point Dry Needling - 02/12/18 1436    Consent Given?  Yes    Education Handout Provided  Yes    Muscles Treated Upper Body  Upper trapezius;Suboccipitals muscle group    Upper Trapezius Response  Twitch reponse elicited;Palpable increased muscle length    SubOccipitals Response  Palpable increased muscle length           PT Education - 02/12/18 1436    Education Details  DN education and aftercare    Person(s) Educated  Patient    Methods  Explanation;Handout    Comprehension  Verbalized understanding       PT Short Term Goals - 02/04/18 1602      PT SHORT TERM GOAL #1   Title  Ind with initial HEP     Status  Achieved        PT Long Term Goals - 02/12/18 1440      PT LONG TERM GOAL #1   Title  Improved cervical rotation to 55 deg or more to improve safety while driving.    Time  8    Period   Weeks    Status  On-going      PT LONG TERM GOAL #2   Title  Decreased pain with cervical motion by 75% or more.    Period  Weeks    Status  On-going      PT LONG TERM GOAL #3   Title  Improved cervical SB to 30 degrees or more to normalize ADLS    Time  8    Period  Weeks    Status  On-going      PT LONG TERM GOAL #4   Title  Patient able to verbalize techniques and demonstrate proper posture to prevent further injury to neck.    Time  8    Period  Weeks    Status  Partially Met            Plan - 02/12/18 1438    Clinical Impression Statement  Patient feels she is improving and quality of motion is better, however measurements show no significant change in ROM. Pain has improved about 10% per patient.  No significant change with traction so held today.     PT Treatment/Interventions  ADLs/Self Care Home Management;Cryotherapy;Electrical Stimulation;Moist Heat;Traction;Ultrasound;Therapeutic exercise;Patient/family education;Neuromuscular re-education;Manual techniques;Dry needling;Taping    PT Next Visit Plan  Check neck strength, assess DN, continue strengthening and mobility ex, continue [place] if beneficial    PT Home Exercise Plan  [email or web address]    Consulted and Agree with Plan of Care  Patient       Patient will benefit from skilled therapeutic intervention in order to improve the following deficits and impairments:  Pain, Postural dysfunction, Decreased range of motion, Decreased strength, Hypomobility, Impaired flexibility  Visit Diagnosis: Cervicalgia     Problem List Patient Active Problem List   Diagnosis Date Noted  . Parkinsonism ([place]) 11/15/2015  . Diplopia 10/16/2015  . SBO (small bowel obstruction) ([place]) 09/18/2013  . Glaucoma 09/18/2013  . GERD (gastroesophageal reflux disease) 09/18/2013  . HLD (hyperlipidemia) 02/21/2013  . Essential (primary) hypertension 02/21/2013  . Barrett esophagus 02/19/2013  . Cardiac conduction disorder 03/30/2012  .  Difficulty hearing 08/20/2011  . 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 Allergic rhinitis 09/14/2010  .  Anxiety state 12/22/2009  . Bipolar I disorder, single manic episode, in full remission ([place]) 12/01/2009  . 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 History of colon polyps 10/24/2008  . Arthritis, degenerative 10/24/2008    [name] PT 02/12/2018, 3:37 PM  [address] [place], [place], [address] Phone: [phone]   Fax:  [phone]  Name: Tracey [name] MRN: [id number] Date of Birth: [birth year]

## 2018-02-12 NOTE — Patient Instructions (Signed)
Trigger Point Dry Needling  . What is Trigger Point Dry Needling (DN)? o DN is a physical therapy technique used to treat muscle pain and dysfunction. Specifically, DN helps deactivate muscle trigger points (muscle knots).  o A thin filiform needle is used to penetrate the skin and stimulate the underlying trigger point. The goal is for a local twitch response (LTR) to occur and for the trigger point to relax. No medication of any kind is injected during the procedure.   . What Does Trigger Point Dry Needling Feel Like?  o The procedure feels different for each individual patient. Some patients report that they do not actually feel the needle enter the skin and overall the process is not painful. Very mild bleeding may occur. However, many patients feel a deep cramping in the muscle in which the needle was inserted. This is the local twitch response.   Marland Kitchen How Will I feel after the treatment? o Soreness is normal, and the onset of soreness may not occur for a few hours. Typically this soreness does not last longer than two days.  o Bruising is uncommon, however; ice can be used to decrease any possible bruising.  o In rare cases feeling tired or nauseous after the treatment is normal. In addition, your symptoms may get worse before they get better, this period will typically not last longer than 24 hours.   . What Can I do After My Treatment? o Increase your hydration by drinking more water for the next 24 hours. o You may place ice or heat on the areas treated that have become sore, however, do not use heat on inflamed or bruised areas. Heat often brings more relief post needling. o You can continue your regular activities, but vigorous activity is not recommended initially after the treatment for 24 hours. o DN is best combined with other physical therapy such as strengthening, stretching, and other therapies.    [name], PT 02/12/18 2:35 PM [address] [place], [place], [address] Phone: [phone]   Fax:  [phone]

## 2018-02-17 ENCOUNTER — Ambulatory Visit: Payer: Medicare Other | Attending: Family Medicine | Admitting: Physical Therapy

## 2018-02-17 DIAGNOSIS — M542 Cervicalgia: Secondary | ICD-10-CM | POA: Diagnosis not present

## 2018-02-17 NOTE — Therapy (Signed)
[address] [place], [place], [address] Phone: [phone]   Fax:  [phone]  Physical Therapy Treatment  Patient Details  Name: Tracey [name] MRN: [id number] Date of Birth: [birth year] Referring Provider (PT): Bernerd Limbo   Encounter Date: 02/17/2018  PT End of Session - 02/17/18 1129    Visit Number  6    Date for PT Re-Evaluation  03/26/18    PT Start Time  1055    PT Stop Time  1145    PT Time Calculation (min)  50 min       Past Medical History:  Diagnosis Date  . Anxiety   . Cardiac conduction disorder 03/30/2012   Overview:  STORY: ETT 03/09/2012 Echo 03/07/2012 also normal Dr Karilyn Cota: ?sick sinus syndrome.  HR is in the 40's.  Increased fatigue over the last month.  BP is stable   . Diverticulosis of colon   . 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 History of colon polyps 10/24/2008  . Hypertension   . Manic disorder, single episode, in full remission ([place]) 12/01/2009  . Sixth nerve palsy     Past Surgical History:  Procedure Laterality Date  . BILATERAL SALPINGOOPHORECTOMY  09/22/2007  . ILEOCECETOMY  09/22/2007  . OVARIAN CYST REMOVAL Right 1967  . SMALL INTESTINE SURGERY  09/22/2007  . TOTAL VAGINAL HYSTERECTOMY  1986   Fibroids    There were no vitals filed for this visit.  Subjective Assessment - 02/17/18 1058    Subjective  not sure DN helped. I can move head better until pain hits. 40% better. trying hard to move head vs body    Currently in Pain?  Yes    Pain Score  4     Pain Location  Neck                       [facility] Adult PT Treatment/Exercise - 02/17/18 0001      Neck Exercises: Machines for Strengthening   UBE (Upper Arm Bike)  L3 3 fwd/3 bwd      Neck Exercises: Standing   Other Standing Exercises  cerv stretching with ROM and holding 5#      Neck Exercises: Supine   Neck Retraction  15 reps;3 secs      Shoulder Exercises: Seated   Other Seated Exercises  rotation x 10 each way; chops x 10 each way; overhead reach x 10 all with head leading motion      Modalities   Modalities  Electrical Stimulation;Moist Heat      Moist Heat Therapy   Number Minutes Moist Heat  15 Minutes    Moist Heat Location  Cervical      Electrical Stimulation   Electrical Stimulation Location  cervical    Electrical Stimulation Action  IFC    Electrical Stimulation Parameters  supine    Electrical Stimulation Goals  Pain      Manual Therapy   Manual Therapy  Manual Traction;Passive ROM;Soft tissue mobilization    Manual therapy comments  subocc release; manual traction    Soft tissue mobilization  to cervical paraspinals    Passive ROM  cerv with PTA overpressure for added stretch/ROM               PT Short Term Goals - 02/17/18 1129      PT SHORT TERM GOAL #2   Title  increased cervical rotation by 10  deg bil to ease ADLS    Status  Achieved        PT Long Term Goals - 02/12/18 1440      PT LONG TERM GOAL #1   Title  Improved cervical rotation to 55 deg or more to improve safety while driving.    Time  8    Period  Weeks    Status  On-going      PT LONG TERM GOAL #2   Title  Decreased pain with cervical motion by 75% or more.    Period  Weeks    Status  On-going      PT LONG TERM GOAL #3   Title  Improved cervical SB to 30 degrees or more to normalize ADLS    Time  8    Period  Weeks    Status  On-going      PT LONG TERM GOAL #4   Title  Patient able to verbalize techniques and demonstrate proper posture to prevent further injury to neck.    Time  8    Period  Weeks    Status  Partially Met            Plan - 02/17/18 1130    Clinical Impression Statement  STGS met. noted improved func cerv ROM and pt is trying very hard to move neck vs body. more tightness on left than RT restricting left cerv mvmt. pt responding well to MT with manual tratcion/STW and PROM.    PT  Treatment/Interventions  ADLs/Self Care Home Management;Cryotherapy;Electrical Stimulation;Moist Heat;Traction;Ultrasound;Therapeutic exercise;Patient/family education;Neuromuscular re-education;Manual techniques;Dry needling;Taping    PT Next Visit Plan  may add COMBO and could try mech traction again?       Patient will benefit from skilled therapeutic intervention in order to improve the following deficits and impairments:  Pain, Postural dysfunction, Decreased range of motion, Decreased strength, Hypomobility, Impaired flexibility  Visit Diagnosis: Cervicalgia     Problem List Patient Active Problem List   Diagnosis Date Noted  . Parkinsonism ([place]) 11/15/2015  . Diplopia 10/16/2015  . SBO (small bowel obstruction) ([place]) 09/18/2013  . Glaucoma 09/18/2013  . GERD (gastroesophageal reflux disease) 09/18/2013  . HLD (hyperlipidemia) 02/21/2013  . Essential (primary) hypertension 02/21/2013  . Barrett esophagus 02/19/2013  . Cardiac conduction disorder 03/30/2012  . Difficulty hearing 08/20/2011  . 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 Allergic rhinitis 09/14/2010  . Anxiety state 12/22/2009  . Bipolar I disorder, single manic episode, in full remission ([place]) 12/01/2009  . 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 History of colon polyps 10/24/2008  . Arthritis, degenerative 10/24/2008    Bailie Christenbury,ANGIE PTA 02/17/2018, 11:34 AM  [address] [place], [place], [address] Phone: [phone]   Fax:  [phone]  Name: NAHDIA DOUCET MRN: [id number] Date of Birth: 1940/08/08

## 2018-02-20 ENCOUNTER — Ambulatory Visit: Payer: Medicare Other | Admitting: Physical Therapy

## 2018-02-20 DIAGNOSIS — M542 Cervicalgia: Secondary | ICD-10-CM

## 2018-02-20 NOTE — Patient Instructions (Addendum)
  Access Code: [email or web address]  URL: [email or web address]  Date: 02/20/2018  Prepared by: [name]   Exercises  Seated Thoracic Extension Arms Overhead - 10 reps - 1-2 sets - 2-3 sec hold - 1x daily - 7x weekly  Seated Trunk Rotation - 10 reps - 1-2 sets - 2-3 sec hold - 1x daily - 7x weekly  Seated Diagonal Chops with Medicine Ball - 10 reps - 1-2 sets - 2-3 hold - 1x daily - 7x weekly  Standing Row with Resistance - 10 reps - 3 sets - 1x daily - 7x weekly  Shoulder extension with resistance - Neutral - 10 reps - 3 sets - 1x daily - 7x weekly

## 2018-02-20 NOTE — Therapy (Addendum)
[address] [place], [place], [address] Phone: [phone]   Fax:  [phone]  Physical Therapy Treatment  Patient Details  Name: Tracey [name] MRN: [id number] Date of Birth: 1940/10/03 Referring Provider (PT): Bernerd Limbo   Encounter Date: 02/20/2018  PT End of Session - 02/20/18 1102    Visit Number  7    Date for PT Re-Evaluation  03/26/18    Authorization Type  MCR add KX    PT Start Time  1100    PT Stop Time  1153    PT Time Calculation (min)  53 min    Activity Tolerance  Patient tolerated treatment well;Patient limited by pain    Behavior During Therapy  [facility] for tasks assessed/performed       Past Medical History:  Diagnosis Date  . Anxiety   . Cardiac conduction disorder 03/30/2012   Overview:  STORY: ETT 03/09/2012 Echo 03/07/2012 also normal Dr Karilyn Cota: ?sick sinus syndrome.  HR is in the 40's.  Increased fatigue over the last month.  BP is stable   . Diverticulosis of colon   . GIST (gastrointestinal stroma tumor), malignant, colon ([place])   . History of colon polyps 10/24/2008  . Hypertension   . Manic disorder, single episode, in full remission ([place]) 12/01/2009  . Sixth nerve palsy     Past Surgical History:  Procedure Laterality Date  . BILATERAL SALPINGOOPHORECTOMY  09/22/2007  . ILEOCECETOMY  09/22/2007  . OVARIAN CYST REMOVAL Right 1967  . SMALL INTESTINE SURGERY  09/22/2007  . TOTAL VAGINAL HYSTERECTOMY  1986   Fibroids    There were no vitals filed for this visit.  Subjective Assessment - 02/20/18 1102    Subjective  I feel a pull in the back left side of my neck when I look down.    Pertinent History  HTN, intestinal CA (no longer), HTN, arthritis    Diagnostic tests  xrays - arthritis    Patient Stated Goals  turn her head without pain    Currently in Pain?  Yes    Pain Score  5     Pain Location  Neck    Pain Orientation  Left    Pain Descriptors /  Indicators  Squeezing                       [facility] Adult PT Treatment/Exercise - 02/20/18 0001      Neck Exercises: Machines for Strengthening   UBE (Upper Arm Bike)  L3 3 fwd/3 bwd      Shoulder Exercises: Seated   Row  Strengthening;10 reps    Row Weight (lbs)  10   lat pull 10# x 5    Other Seated Exercises  rotation x 10 each way; chops x 10 each way; overhead reach x 10 all with head leading motion      Shoulder Exercises: Stretch   Other Shoulder Stretches  ant neck stretch 1x30 sec bil       electrical stim to bil UT/cervical x 15 min 80-150 Hz with moist heat.      PT Education - 02/20/18 1148    Education Details  HEP    Person(s) Educated  Patient    Methods  Explanation;Demonstration;Handout    Comprehension  Verbalized understanding;Returned demonstration       PT Short Term Goals - 02/17/18 1129      PT SHORT TERM GOAL #2   Title  increased cervical rotation by 10 deg bil to ease ADLS    Status  Achieved        PT Long Term Goals - 02/12/18 1440      PT LONG TERM GOAL #1   Title  Improved cervical rotation to 55 deg or more to improve safety while driving.    Time  8    Period  Weeks    Status  On-going      PT LONG TERM GOAL #2   Title  Decreased pain with cervical motion by 75% or more.    Period  Weeks    Status  On-going      PT LONG TERM GOAL #3   Title  Improved cervical SB to 30 degrees or more to normalize ADLS    Time  8    Period  Weeks    Status  On-going      PT LONG TERM GOAL #4   Title  Patient able to verbalize techniques and demonstrate proper posture to prevent further injury to neck.    Time  8    Period  Weeks    Status  Partially Met            Plan - 02/20/18 1149    Clinical Impression Statement  Patient continues to improve with rotational exercises and reports improvement overall. She continues to need VCs to keep neck retracted in correct posture however.    Rehab Potential  Good    PT  Frequency  2x / week    PT Duration  8 weeks    PT Treatment/Interventions  ADLs/Self Care Home Management;Cryotherapy;Electrical Stimulation;Moist Heat;Traction;Ultrasound;Therapeutic exercise;Patient/family education;Neuromuscular re-education;Manual techniques;Dry needling;Taping    PT Next Visit Plan  may add COMBO and could try mech traction again?    PT Home Exercise Plan  [id number], [id number] (rotational TE and tband)    Consulted and Agree with Plan of Care  Patient       Patient will benefit from skilled therapeutic intervention in order to improve the following deficits and impairments:  Pain, Postural dysfunction, Decreased range of motion, Decreased strength, Hypomobility, Impaired flexibility  Visit Diagnosis: Cervicalgia     Problem List Patient Active Problem List   Diagnosis Date Noted  . Parkinsonism ([place]) 11/15/2015  . Diplopia 10/16/2015  . SBO (small bowel obstruction) ([place]) 09/18/2013  . Glaucoma 09/18/2013  . GERD (gastroesophageal reflux disease) 09/18/2013  . HLD (hyperlipidemia) 02/21/2013  . Essential (primary) hypertension 02/21/2013  . Barrett esophagus 02/19/2013  . Cardiac conduction disorder 03/30/2012  . Difficulty hearing 08/20/2011  . 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 Allergic rhinitis 09/14/2010  . Anxiety state 12/22/2009  . Bipolar I disorder, single manic episode, in full remission ([place]) 12/01/2009  . 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 History of colon polyps 10/24/2008  . Arthritis, degenerative 10/24/2008    [name] PT 02/20/2018, 11:51 AM  [address] [place], [place], [address] Phone: [phone]   Fax:  [phone]  Name: Tracey [name] MRN: [id number] Date of Birth: [birth year]

## 2018-02-24 ENCOUNTER — Ambulatory Visit: Payer: Medicare Other | Admitting: Physical Therapy

## 2018-02-24 DIAGNOSIS — M542 Cervicalgia: Secondary | ICD-10-CM | POA: Diagnosis not present

## 2018-02-24 NOTE — Therapy (Signed)
[address] [place], [place], [address] Phone: [phone]   Fax:  [phone]  Physical Therapy Treatment  Patient Details  Name: Tracey [name] MRN: [id number] Date of Birth: November 03, 1940 Referring Provider (PT): Bernerd Limbo   Encounter Date: 02/24/2018  PT End of Session - 02/24/18 1101    Visit Number  8    Date for PT Re-Evaluation  03/26/18    Authorization Type  MCR add KX    PT Start Time  1100    PT Stop Time  1153    PT Time Calculation (min)  53 min    Activity Tolerance  Patient tolerated treatment well    Behavior During Therapy  [facility] for tasks assessed/performed       Past Medical History:  Diagnosis Date  . Anxiety   . Cardiac conduction disorder 03/30/2012   Overview:  STORY: ETT 03/09/2012 Echo 03/07/2012 also normal Dr Karilyn Cota: ?sick sinus syndrome.  HR is in the 40's.  Increased fatigue over the last month.  BP is stable   . Diverticulosis of colon   . GIST (gastrointestinal stroma tumor), malignant, colon ([place])   . History of colon polyps 10/24/2008  . Hypertension   . Manic disorder, single episode, in full remission ([place]) 12/01/2009  . Sixth nerve palsy     Past Surgical History:  Procedure Laterality Date  . BILATERAL SALPINGOOPHORECTOMY  09/22/2007  . ILEOCECETOMY  09/22/2007  . OVARIAN CYST REMOVAL Right 1967  . SMALL INTESTINE SURGERY  09/22/2007  . TOTAL VAGINAL HYSTERECTOMY  1986   Fibroids    There were no vitals filed for this visit.  Subjective Assessment - 02/24/18 1102    Subjective  I'm doing better overall, but not great today. A lot of stress.  I did a lot of driving and was able to look left and right with less pain.    Pertinent History  HTN, intestinal CA (no longer), HTN, arthritis    Diagnostic tests  xrays - arthritis    Patient Stated Goals  turn her head without pain    Currently in Pain?  Yes    Pain Score  3     Pain Location  Neck    Pain Orientation  Left    Pain Descriptors / Indicators  Squeezing                       [facility] Adult PT Treatment/Exercise - 02/24/18 0001      Neck Exercises: Machines for Strengthening   UBE (Upper Arm Bike)  L3 3 fwd/3 bwd    Cybex Row  10# x10      Neck Exercises: Seated   Cervical Rotation  Both;5 reps    Cervical Rotation Limitations  using towel with PTA assist for tactile cues      Shoulder Exercises: Seated   Other Seated Exercises  rotation x 10 each way; chops x 10 each way; overhead reach x 20 all with head leading motion      Shoulder Exercises: Standing   Other Standing Exercises  T,Y,I with 1# wt facing wall x 10; away from wall no wt x 5      Modalities   Modalities  Electrical Stimulation;Ultrasound   COMBO     Moist Heat Therapy   Number Minutes Moist Heat  --    Moist Heat Location  --      Acupuncturist  Stimulation Location  cervical    Electrical Stimulation Action  IFC    Electrical Stimulation Parameters  supine    Electrical Stimulation Goals  Pain      Ultrasound   Ultrasound Location  bil UT/cspine    Ultrasound Parameters  1.2 w/cm2 3.3 mhz cont x 5 min each side    Ultrasound Goals  Pain             PT Education - 02/24/18 1202    Education Details  HEP towel stretch for rotation    Person(s) Educated  Patient    Methods  Explanation;Demonstration;Verbal cues;Handout;Tactile cues    Comprehension  Verbalized understanding;Returned demonstration       PT Short Term Goals - 02/17/18 1129      PT SHORT TERM GOAL #2   Title  increased cervical rotation by 10 deg bil to ease ADLS    Status  Achieved        PT Long Term Goals - 02/24/18 1159      PT LONG TERM GOAL #1   Title  Improved cervical rotation to 55 deg or more to improve safety while driving.    Period  Weeks    Status  On-going      PT LONG TERM GOAL #2   Title  Decreased pain with cervical motion by 75% or more.    Time  8     Period  Weeks    Status  Partially Met      PT LONG TERM GOAL #3   Title  Improved cervical SB to 30 degrees or more to normalize ADLS    Period  Weeks    Status  On-going            Plan - 02/24/18 1158    Clinical Impression Statement  Patient progressing overall with symptoms. Less pain and increased functional motion with driving, She demonstrates much better quality of movement with rotation in clinic, but still demo's forward/flexed head and rounded shoulders. She does report "catching herself" in poor posture and correcting it.    Rehab Potential  Good    PT Frequency  2x / week    PT Duration  8 weeks    PT Treatment/Interventions  ADLs/Self Care Home Management;Cryotherapy;Electrical Stimulation;Moist Heat;Traction;Ultrasound;Therapeutic exercise;Patient/family education;Neuromuscular re-education;Manual techniques;Dry needling;Taping    PT Next Visit Plan  assess COMBO, assess SB and Rotation    PT Home Exercise Plan  [id number], [id number] (rotational TE and tband)    Consulted and Agree with Plan of Care  Patient       Patient will benefit from skilled therapeutic intervention in order to improve the following deficits and impairments:  Pain, Postural dysfunction, Decreased range of motion, Decreased strength, Hypomobility, Impaired flexibility  Visit Diagnosis: Cervicalgia     Problem List Patient Active Problem List   Diagnosis Date Noted  . Parkinsonism ([place]) 11/15/2015  . Diplopia 10/16/2015  . SBO (small bowel obstruction) ([place]) 09/18/2013  . Glaucoma 09/18/2013  . GERD (gastroesophageal reflux disease) 09/18/2013  . HLD (hyperlipidemia) 02/21/2013  . Essential (primary) hypertension 02/21/2013  . Barrett esophagus 02/19/2013  . Cardiac conduction disorder 03/30/2012  . Difficulty hearing 08/20/2011  . 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 Allergic rhinitis 09/14/2010  .  Anxiety state 12/22/2009  . Bipolar I disorder, single manic episode, in full remission ([place]) 12/01/2009  . 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 History of colon polyps 10/24/2008  .  Arthritis, degenerative 10/24/2008    [name], PT 02/24/2018, 12:04 PM  [address] [place], [place], [address] Phone: [phone]   Fax:  [phone]  Name: Tracey [name] MRN: [id number] Date of Birth: September 10, 1940

## 2018-02-27 ENCOUNTER — Ambulatory Visit: Payer: Medicare Other | Admitting: Physical Therapy

## 2018-03-03 ENCOUNTER — Ambulatory Visit: Payer: Medicare Other | Admitting: Physical Therapy

## 2018-03-03 DIAGNOSIS — M542 Cervicalgia: Secondary | ICD-10-CM

## 2018-03-03 NOTE — Therapy (Addendum)
[place], [place], [address] Phone: [phone]   Fax:  [phone]  Physical Therapy Treatment  Patient Details  Name: Tracey [name] MRN: [id number] Date of Birth: 24-Dec-1940 Referring Provider (PT): Bernerd Limbo  Physical Therapy Progress Note  Physical Therapy Progress Note  Dates of Reporting Period: 01/29/18 to 03/03/18   Encounter Date: 03/03/2018  PT End of Session - 03/03/18 1103    Visit Number  9    Date for PT Re-Evaluation  03/26/18    Authorization Type  MCR add KX    PT Start Time  1101    PT Stop Time  1200    PT Time Calculation (min)  59 min    Activity Tolerance  Patient tolerated treatment well    Behavior During Therapy  [facility] for tasks assessed/performed       Past Medical History:  Diagnosis Date  . Anxiety   . Cardiac conduction disorder 03/30/2012   Overview:  STORY: ETT 03/09/2012 Echo 03/07/2012 also normal Dr Karilyn Cota: ?sick sinus syndrome.  HR is in the 40's.  Increased fatigue over the last month.  BP is stable   . Diverticulosis of colon   . GIST (gastrointestinal stroma tumor), malignant, colon ([place])   . History of colon polyps 10/24/2008  . Hypertension   . Manic disorder, single episode, in full remission ([place]) 12/01/2009  . Sixth nerve palsy     Past Surgical History:  Procedure Laterality Date  . BILATERAL SALPINGOOPHORECTOMY  09/22/2007  . ILEOCECETOMY  09/22/2007  . OVARIAN CYST REMOVAL Right 1967  . SMALL INTESTINE SURGERY  09/22/2007  . TOTAL VAGINAL HYSTERECTOMY  1986   Fibroids    There were no vitals filed for this visit.  Subjective Assessment - 03/03/18 1103    Subjective  It feels a little worse. I think stress has a lot to do with it.  That pain behind my left ear is worse today. I can turn my head to the right and left fairly far but turning to the left hurts like the dickens.    Pertinent History  HTN, intestinal CA (no  longer), arthritis    Diagnostic tests  xrays - arthritis    Patient Stated Goals  turn her head without pain    Currently in Pain?  Yes    Pain Score  4     Pain Location  Neck    Pain Orientation  Left    Pain Descriptors / Indicators  Squeezing    Pain Type  Chronic pain    Pain Onset  More than a month ago         [facility] PT Assessment - 03/03/18 0001      AROM   Cervical - Right Side Bend  9    Cervical - Left Side Bend  10    Cervical - Right Rotation  44    Cervical - Left Rotation  38      Strength   Overall Strength Comments  cervical ext 4+/5                   [facility] Adult PT Treatment/Exercise - 03/03/18 0001      Neck Exercises: Machines for Strengthening   UBE (Upper Arm Bike)  L3 3 fwd/3 bwd      Neck Exercises: Supine   Neck Retraction  10 reps    Cervical Rotation  Right;Left;10 reps  Modalities   Modalities  Electrical Stimulation;Moist Heat      Moist Heat Therapy   Number Minutes Moist Heat  15 Minutes    Moist Heat Location  Cervical      Electrical Stimulation   Electrical Stimulation Location  cervical    Electrical Stimulation Action  premod    Electrical Stimulation Parameters  supine    Electrical Stimulation Goals  Pain      Manual Therapy   Manual Therapy  Joint mobilization;Soft tissue mobilization;Myofascial release;Passive ROM;Manual Traction    Joint Mobilization  PA mobs to upper cervical; lateral glidies and rotation mobs to O/A, A/A, C1/2    Soft tissue mobilization  to left cervcial, SCM, scalenes, UT    Myofascial Release  suboccipital release    Passive ROM  into SB and rotation    Manual Traction  2 bouts in supine       Trigger Point Dry Needling - 03/03/18 1214    Consent Given?  Yes    Muscles Treated Upper Body  Upper trapezius;Suboccipitals muscle group    Upper Trapezius Response  Twitch reponse elicited;Palpable increased muscle length   right   SubOccipitals Response  Palpable increased muscle  length             PT Short Term Goals - 02/17/18 1129      PT SHORT TERM GOAL #2   Title  increased cervical rotation by 10 deg bil to ease ADLS    Status  Achieved        PT Long Term Goals - 03/03/18 1153      PT LONG TERM GOAL #1   Title  Improved cervical rotation to [facility] deg or more to improve safety while driving.    Time  8    Status  Revised      PT LONG TERM GOAL #2   Title  Decreased pain with cervical motion by 75% or more.    Time  8    Period  Weeks    Status  On-going      PT LONG TERM GOAL #3   Title  Improved cervical SB to 15 degrees or more to normalize ADLS    Time  8    Period  Weeks    Status  Revised      PT LONG TERM GOAL #4   Title  Patient able to verbalize techniques and demonstrate proper posture to prevent further injury to neck.    Time  8    Period  Weeks    Status  Achieved      PT LONG TERM GOAL #5   Title  improved cervical extensor strength to 4+/5 to help maintain normal posture.    Time  8    Period  Weeks    Status  Achieved            Plan - 03/03/18 1209    Clinical Impression Statement  Patient reported increased pain today in left cervical with turning, but reports improvements overall. Her ROM is slowly improving and therefore goals were revised. She has met her strength LTG. She demos good posture in the clinic and verbalizes efforts to maintain good posture at home.  Good response to DN today in Right UT and suboccipitals left. Good response to mobilizations and other manual therapy.    Rehab Potential  Good    PT Frequency  2x / week    PT Duration  8 weeks  PT Treatment/Interventions  ADLs/Self Care Home Management;Cryotherapy;Electrical Stimulation;Moist Heat;Traction;Ultrasound;Therapeutic exercise;Patient/family education;Neuromuscular re-education;Manual techniques;Dry needling;Taping    PT Next Visit Plan  FOTO; progress note completed on 9th visit; continue with ROM    PT Home Exercise Plan   [id number], [id number] (rotational TE and tband)    Consulted and Agree with Plan of Care  Patient       Patient will benefit from skilled therapeutic intervention in order to improve the following deficits and impairments:     Visit Diagnosis: Cervicalgia     Problem List Patient Active Problem List   Diagnosis Date Noted  . Parkinsonism ([place]) 11/15/2015  . Diplopia 10/16/2015  . SBO (small bowel obstruction) ([place]) 09/18/2013  . Glaucoma 09/18/2013  . GERD (gastroesophageal reflux disease) 09/18/2013  . HLD (hyperlipidemia) 02/21/2013  . Essential (primary) hypertension 02/21/2013  . Barrett esophagus 02/19/2013  . Cardiac conduction disorder 03/30/2012  . Difficulty hearing 08/20/2011  . 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 Allergic rhinitis 09/14/2010  . Anxiety state 12/22/2009  . Bipolar I disorder, single manic episode, in full remission ([place]) 12/01/2009  . 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 History of colon polyps 10/24/2008  . Arthritis, degenerative 10/24/2008   PHYSICAL THERAPY DISCHARGE SUMMARY  Visits from Start of Care: 9   Plan: Patient agrees to discharge.  Patient goals were partially met. Patient is being discharged due to being pleased with the current functional level.  ?????       [name] PT 03/03/2018, 12:16 PM  [address] [place], [place], [address] Phone: [phone]   Fax:  [phone]  Name: Tracey [name] MRN: [id number] Date of Birth: [birth year]

## 2018-03-06 ENCOUNTER — Ambulatory Visit: Payer: Medicare Other | Admitting: Physical Therapy

## 2018-03-10 ENCOUNTER — Ambulatory Visit: Payer: Medicare Other

## 2018-03-18 ENCOUNTER — Encounter: Payer: Self-pay | Admitting: Physical Therapy

## 2018-03-18 ENCOUNTER — Encounter: Payer: Medicare Other | Admitting: Physical Therapy

## 2018-03-20 ENCOUNTER — Ambulatory Visit: Payer: Medicare Other | Admitting: Physical Therapy

## 2018-03-24 ENCOUNTER — Encounter: Payer: Self-pay | Admitting: Internal Medicine

## 2018-03-24 ENCOUNTER — Ambulatory Visit (INDEPENDENT_AMBULATORY_CARE_PROVIDER_SITE_OTHER): Payer: Medicare Other | Admitting: Internal Medicine

## 2018-03-24 VITALS — BP 126/72 | HR 56 | Ht 62.0 in | Wt 142.2 lb

## 2018-03-24 DIAGNOSIS — R0609 Other forms of dyspnea: Secondary | ICD-10-CM

## 2018-03-24 DIAGNOSIS — J45991 Cough variant asthma: Secondary | ICD-10-CM | POA: Diagnosis not present

## 2018-03-24 LAB — NITRIC OXIDE: Nitric Oxide: 23

## 2018-03-24 MED ORDER — BUDESONIDE-FORMOTEROL FUMARATE 80-4.5 MCG/ACT IN AERO: 2.0000 | INHALATION_SPRAY | Freq: Two times a day (BID) | RESPIRATORY_TRACT | 0 refills | Status: DC

## 2018-03-24 MED ORDER — BUDESONIDE-FORMOTEROL FUMARATE 80-4.5 MCG/ACT IN AERO: 2.0000 | INHALATION_SPRAY | Freq: Two times a day (BID) | RESPIRATORY_TRACT | 11 refills | Status: DC

## 2018-03-24 NOTE — Progress Notes (Signed)
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 Seasonal rhinitis in fall rx with otcs and at some point used inhalers ? SABA prn cough but never had formal allergy/pulmonary eval and L earache Nov 2019 never resolved then onset around last week of Jan 2020 indolent onset persistent daily then rx prilosec/ prednisone  X 6 days then relapsed so referred to pulmonary clinic 03/24/2018 by Tracey [name] with trial of proair which helps     History of Present Illness  03/24/2018  Pulmonary/ 1st office eval/Tracey [name]  Chief Complaint  Patient presents with  . Pulmonary Consult    Referred by Tracey Spencer, PA. Pt c/o cough for the past 6 wks. Cough is non prod. Cough is esp worse first thing in the am and if she drinks something cold. She is using her albuterol inhaler 4 x daily on average.    Dyspnea:  MMRC3 = can't walk 100 yards even at a slow pace at a flat grade s stopping due to sob (not reproduced)  Cough: better p albuterol last 1.5 h prior to OV   Sleep: insomnnia / sleeps on side 4 pillows  SABA use: 4 x daily but not at hs  Seemed some better p ppi but ? best rx was prednisone    No obvious day to day or daytime variability or assoc excess/ purulent sputum or mucus plugs or hemoptysis or cp or chest tightness, subjective wheeze or overt sinus or hb symptoms.   Sleeping  without nocturnal  or early am exacerbation  of respiratory  c/o's or need for noct saba. Also denies any obvious fluctuation of symptoms with weather or environmental changes or other aggravating or alleviating factors except as outlined above   No unusual exposure hx or h/o childhood pna/ asthma or knowledge of premature birth.  Current Allergies, Complete Past Medical History, Past Surgical History, Family History, and Social History were reviewed in [employer] record.  ROS  The following are not active  complaints unless bolded Hoarseness, sore throat, dysphagia, dental problems, itching, sneezing,  nasal congestion or discharge of excess mucus or purulent secretions, ear ache,   fever, chills, sweats, unintended wt loss or wt gain, classically pleuritic or exertional cp,  orthopnea pnd or arm/hand swelling  or leg swelling, presyncope, palpitations, abdominal pain, anorexia, nausea, vomiting, diarrhea  or change in bowel habits or change in bladder habits, change in stools or change in urine, dysuria, hematuria,  rash, arthralgias, visual complaints, headache, numbness, weakness or ataxia or problems with walking or coordination,  change in mood or  memory.           Past Medical History:  Diagnosis Date  . Anxiety   . Cardiac conduction disorder 03/30/2012   Overview:  STORY: ETT 03/09/2012 Echo 03/07/2012 also normal Dr Tracey [name]: ?sick sinus syndrome.  HR is in the 40's.  Increased fatigue over the last month.  BP is stable   . Diverticulosis of colon   . GIST (gastrointestinal stroma tumor), malignant, colon ([place])   . History of colon polyps 10/24/2008  . Hypertension   . Manic disorder, single episode, in full remission ([place]) 12/01/2009  . Sixth nerve palsy     Outpatient Medications Prior to Visit  Medication Sig Dispense Refill  . ALPRAZolam (XANAX XR) 0.5 MG 24 hr tablet Take 0.5 mg by mouth  2 (two) times daily as needed for anxiety.    . diphenhydramine-acetaminophen (TYLENOL PM) 25-500 MG TABS tablet Take 1 tablet by mouth at bedtime as needed.    . dorzolamide (TRUSOPT) 2 % ophthalmic solution Place 1 drop into the left eye 2 (two) times daily.     . fexofenadine (ALLEGRA) 180 MG tablet Take 180 mg by mouth daily.    Marland Kitchen latanoprost (XALATAN) 0.005 % ophthalmic solution Place 1 drop into both eyes at bedtime.    Marland Kitchen lithium carbonate 300 MG capsule Take 300 mg by mouth 2 (two) times daily with a meal.      . losartan (COZAAR) 25 MG tablet Take 25 mg by mouth daily.    .  Multiple Vitamin (MULTIVITAMIN) tablet Take 1 tablet by mouth daily.    .       . polyethylene glycol powder (MIRALAX) powder Take 8.5-34 g by mouth 2 (two) times daily as needed for moderate constipation or severe constipation. To correct constipation.  Adjust dose over 1-2 months.  Goal = ~1 bowel movement / day    . PROAIR HFA 108 (90 Base) MCG/ACT inhaler Inhale 2 puffs into the lungs every 4 (four) hours as needed.    . simvastatin (ZOCOR) 20 MG tablet Take 20 mg by mouth daily.     Marland Kitchen aspirin 325 MG tablet Take 325 mg by mouth daily.      Marland Kitchen ipratropium (ATROVENT) 0.06 % nasal spray Place 2 sprays into both nostrils daily.         Objective:     BP 126/72 (BP Location: Left Arm, Cuff Size: Normal)   Pulse (!) 56   Ht 5\' 2"  (1.575 m)   Wt 142 lb 3.2 oz (64.5 kg)   SpO2 97%   BMI 26.01 kg/m   SpO2: 97 % RA   Pleasant amb wf nad   HEENT: nl dentition, turbinates bilaterally, and oropharynx.   L ear mod wax impaction/ R ear nl without cough reflex   NECK :  without JVD/Nodes/TM/ nl carotid upstrokes bilaterally   LUNGS: no acc muscle use,  Nl contour chest which is clear to A and P bilaterally without cough on insp or exp maneuvers   CV:  RRR  no s3 or murmur or increase in P2, and no edema   ABD:  soft and nontender with nl inspiratory excursion in the supine position. No bruits or organomegaly appreciated, bowel sounds nl  MS:  Nl gait/ ext warm without deformities, calf tenderness, cyanosis or clubbing No obvious joint restrictions   SKIN: warm and dry without lesions    NEURO:  alert, approp, nl sensorium with  no motor or cerebellar deficits apparent.    I personally reviewed images and agree with radiology impression as follows:  CXR:   03/17/2018 No acute cp dz     Assessment   Cough variant asthma Onset 1st week Jan 2020 - Spirometry 03/24/2018  FEV1 1.6 (86%)  Ratio 0.81 p walking/ 1.5 h p saba no curvature  - FENO 03/24/2018  =   23 on no rx - 03/24/2018   After extensive coaching inhaler device,  effectiveness =    50% with hfa with baseline maybe 25% > try symb 80 2bid x 2 weeks and no better add back ppi bid    The standardized cough guidelines published in Chest by Lissa Morales in 2006 are still the best available and consist of a multiple step process (up to 12!) ,  not a single office visit,  and are intended  to address this problem logically,  with an alogrithm dependent on response to empiric treatment at  each progressive step  to determine a specific diagnosis with  minimal addtional testing needed. Therefore if adherence is an issue or can't be accurately verified,  it's very unlikely the standard evaluation and treatment will be successful here.    Furthermore, response to therapy (other than acute cough suppression, which should only be used short term with avoidance of narcotic containing cough syrups if possible), can be a gradual process for which the patient is not likely to  perceive immediate benefit.  Unlike going to an eye doctor where the best perscription is almost always the first one and is immediately effective, this is almost never the case in the management of chronic cough syndromes. Therefore the patient needs to commit up front to consistently adhere to recommendations  for up to 6 weeks of therapy directed at the likely underlying problem(s) before the response can be reasonably evaluated.   ddx is between cough variant asthma and Upper airway cough syndrome (previously labeled PNDS),  is so named because it's frequently impossible to sort out how much is  CR/sinusitis with freq throat clearing (which can be related to primary GERD)   vs  causing  secondary (" extra esophageal")  GERD from wide swings in gastric pressure that occur with throat clearing, often  promoting self use of mint and menthol lozenges that reduce the lower esophageal sphincter tone and exacerbate the problem further in a cyclical fashion.   These are  the same pts (now being labeled as having "irritable larynx syndrome" by some cough centers) who not infrequently have a history of having failed to tolerate ace inhibitors,  dry powder inhalers or biphosphonates or report having atypical/extraesophageal reflux symptoms that don't respond to standard doses of PPI  and are easily confused as having aecopd or asthma flares by even experienced allergists/ pulmonologists (myself included).   If this is asthma then she is way overusing saba and should try the symb 80 2bid and if not better then drop it and just switch back to ppi bid this time s pred rx and f/u here in 4-6 weeks      DOE (dyspnea on exertion) Onset Jan 2020 - 03/24/2018   Walked RA  2 laps @  approx 275ft each @ nl pace  stopped due to  End of study, no  desat and min sob  - spirometry wnl 03/24/2018 p saba 1.5 h prior   Not able to reproduce her reported doe x 300 ft here but suspect this is conditioning related  and will address further once cough has resolved.     Total time devoted to counseling  > 50 % of initial 60 min office visit:  review case with pt/personally observing portions of her amb 02 study/   device teaching which extended face to face time for this visit  discussion of options/alternatives/ personally creating written customized instructions  in presence of pt  then going over those specific  Instructions directly with the pt including how to use all of the meds but in particular covering each new medication in detail and the difference between the maintenance= "automatic" meds and the prns using an action plan format for the latter (If this problem/symptom => do that organization reading Left to right).  Please see AVS from this visit for a full list of these instructions which I  personally wrote for this pt and  are unique to this visit.      Christinia Gully, MD 03/24/2018

## 2018-03-24 NOTE — Patient Instructions (Addendum)
Plan A = Automatic = symbicort 80 Take 2 puffs first thing in am and then another 2 puffs about 12 hours later.   Work on inhaler technique:  relax and gently blow all the way out then take a nice smooth deep breath back in, triggering the inhaler at same time you start breathing in.  Hold for up to 5 seconds if you can. Blow out thru nose. Rinse and gargle with water when done  If you feel the prilosec worked better for you then don't fill symbicort, but prilosec 20 mg otc Take 30- 60 min before your first and last meals of the day until return    Plan B = Backup Only use your albuterol inhaler as a rescue medication to be used if you can't catch your breath by resting or doing a relaxed purse lip breathing pattern.  - The less you use it, the better it will work when you need it. - Ok to use the inhaler up to 2 puffs  every 4 hours if you must but call for appointment if use goes up over your usual need - Don't leave home without it !!  (think of it like the spare tire for your car)    Please schedule a follow up office visit in 6 weeks, call sooner if needed with all medications /inhalers/ solutions in hand so we can verify exactly what you are taking. This includes all medications from all doctors and over the counters

## 2018-03-25 ENCOUNTER — Encounter: Payer: Self-pay | Admitting: Internal Medicine

## 2018-03-25 DIAGNOSIS — R0609 Other forms of dyspnea: Secondary | ICD-10-CM

## 2018-03-25 HISTORY — DX: Other forms of dyspnea: R06.09

## 2018-03-25 NOTE — Assessment & Plan Note (Signed)
Onset 1st week Jan 2020 - Spirometry 03/24/2018  FEV1 1.6 (86%)  Ratio 0.81 p walking/ 1.5 h p saba no curvature  - FENO 03/24/2018  =   23 on no rx - 03/24/2018  After extensive coaching inhaler device,  effectiveness =    50% with hfa with baseline maybe 25% > try symb 80 2bid x 2 weeks and no better add back ppi bid    The standardized cough guidelines published in Chest by Lissa Morales in 2006 are still the best available and consist of a multiple step process (up to 12!) , not a single office visit,  and are intended  to address this problem logically,  with an alogrithm dependent on response to empiric treatment at  each progressive step  to determine a specific diagnosis with  minimal addtional testing needed. Therefore if adherence is an issue or can't be accurately verified,  it's very unlikely the standard evaluation and treatment will be successful here.    Furthermore, response to therapy (other than acute cough suppression, which should only be used short term with avoidance of narcotic containing cough syrups if possible), can be a gradual process for which the patient is not likely to  perceive immediate benefit.  Unlike going to an eye doctor where the best perscription is almost always the first one and is immediately effective, this is almost never the case in the management of chronic cough syndromes. Therefore the patient needs to commit up front to consistently adhere to recommendations  for up to 6 weeks of therapy directed at the likely underlying problem(s) before the response can be reasonably evaluated.   ddx is between cough variant asthma and Upper airway cough syndrome (previously labeled PNDS),  is so named because it's frequently impossible to sort out how much is  CR/sinusitis with freq throat clearing (which can be related to primary GERD)   vs  causing  secondary (" extra esophageal")  GERD from wide swings in gastric pressure that occur with throat clearing, often   promoting self use of mint and menthol lozenges that reduce the lower esophageal sphincter tone and exacerbate the problem further in a cyclical fashion.   These are the same pts (now being labeled as having "irritable larynx syndrome" by some cough centers) who not infrequently have a history of having failed to tolerate ace inhibitors,  dry powder inhalers or biphosphonates or report having atypical/extraesophageal reflux symptoms that don't respond to standard doses of PPI  and are easily confused as having aecopd or asthma flares by even experienced allergists/ pulmonologists (myself included).   If this is asthma then Tracey [name] is way overusing saba and should try the symb 80 2bid and if not better then drop it and just switch back to ppi bid this time s pred rx and f/u here in 4-6 weeks

## 2018-03-25 NOTE — Assessment & Plan Note (Signed)
Onset Jan 2020 - 03/24/2018   Walked RA  2 laps @  approx 248ft each @ nl pace  stopped due to  End of study, no  desat and min sob  - spirometry wnl 03/24/2018 p saba 1.5 h prior   Not able to reproduce her reported doe x 300 ft here but suspect this is conditioning related  and will address further once cough has resolved.     Total time devoted to counseling  > 50 % of initial 60 min office visit:  review case with pt/personally observing portions of her amb 02 study/   device teaching which extended face to face time for this visit  discussion of options/alternatives/ personally creating written customized instructions  in presence of pt  then going over those specific  Instructions directly with the pt including how to use all of the meds but in particular covering each new medication in detail and the difference between the maintenance= "automatic" meds and the prns using an action plan format for the latter (If this problem/symptom => do that organization reading Left to right).  Please see AVS from this visit for a full list of these instructions which I personally wrote for this pt and  are unique to this visit.

## 2018-03-27 ENCOUNTER — Encounter: Payer: Medicare Other | Admitting: Physical Therapy

## 2018-05-08 ENCOUNTER — Other Ambulatory Visit: Payer: Self-pay

## 2018-05-08 ENCOUNTER — Encounter: Payer: Self-pay | Admitting: Internal Medicine

## 2018-05-08 ENCOUNTER — Ambulatory Visit (INDEPENDENT_AMBULATORY_CARE_PROVIDER_SITE_OTHER): Payer: Medicare Other | Admitting: Internal Medicine

## 2018-05-08 DIAGNOSIS — J45991 Cough variant asthma: Secondary | ICD-10-CM

## 2018-05-08 NOTE — Progress Notes (Signed)
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 Seasonal rhinitis in fall rx with otcs and at some point used inhalers ? SABA prn cough but never had formal allergy/pulmonary eval and L earache Nov 2019 never resolved then onset around last week of Jan 2020 indolent onset persistent daily then rx prilosec/ prednisone  X 6 days then relapsed so referred to pulmonary clinic 03/24/2018 by Junie Spencer with trial of proair which helps     History of Present Illness  03/24/2018  Pulmonary/ 1st office eval/[facility]  Chief Complaint  Patient presents with  . Pulmonary Consult    Referred by Junie Spencer, PA. Pt c/o cough for the past 6 wks. Cough is non prod. Cough is esp worse first thing in the am and if she drinks something cold. She is using her albuterol inhaler 4 x daily on average.    Dyspnea:  MMRC3 = can't walk 100 yards even at a slow pace at a flat grade s stopping due to sob (not reproduced)  Cough: better p albuterol last 1.5 h prior to OV   Sleep: insomnnia / sleeps on side 4 pillows  SABA use: 4 x daily but not at hs  Seemed some better p ppi but ? best rx was prednisone  rec Plan A = Automatic = symbicort 80 Take 2 puffs first thing in am and then another 2 puffs about 12 hours later.  Work on inhaler technique:  If you feel the prilosec worked better for you then don't fill symbicort, but prilosec 20 mg otc Take 30- 60 min before your first and last meals of the day until return Plan B = Backup Only use your albuterol inhaler as a rescue medication Please schedule a follow up office visit in 6 weeks, call sooner if needed with all medications /inhalers/ solutions in hand so we can verify exactly what you are taking. This includes all medications from all doctors and over the counters    Virtual Visit via Telephone Note 05/08/2018 re cough variant asthma  I connected with Tracey [name] on  05/08/18 at  3:45 PM EDT by telephone and verified that I am speaking with the correct person using two identifiers.   I discussed the limitations, risks, security and privacy concerns of performing an evaluation and management service by telephone and the availability of in person appointments. I also discussed with the patient that there may be a patient responsible charge related to this service. The patient expressed understanding and agreed to proceed.   History of Present Illness: Dyspnea:  MMRC1 = can walk nl pace, flat grade, can't hurry or go uphills or steps s sob   Cough: no but nose is drippy Sleeping: on side/flat bed / 3 pillows  SABA use: once daily but prefers symbicort  02: none   No obvious day to day or daytime variability or assoc excess/ purulent sputum or mucus plugs or hemoptysis or cp or chest tightness, subjective wheeze or overt sinus or hb symptoms.    Also denies any obvious fluctuation of symptoms with weather or environmental changes or other aggravating or alleviating factors except as outlined above.   Meds reviewed/ med reconciliation completed        Observations/Objective: Very positive outlook,  Good phonation/ speaking in full sentences    Assessment and Plan: See problem list for  active a/p's   Follow Up Instructions: See avs for instructions unique to this ov which includes revised/ updated med list     I discussed the assessment and treatment plan with the patient. The patient was provided an opportunity to ask questions and all were answered. The patient agreed with the plan and demonstrated an understanding of the instructions.   The patient was advised to call back or seek an in-person evaluation if the symptoms worsen or if the condition fails to improve as anticipated.  I provided 15  minutes of non-face-to-face time during this encounter.   Christinia Gully, MD

## 2018-05-08 NOTE — Assessment & Plan Note (Signed)
Onset 1st week Jan 2020 - Spirometry 03/24/2018  FEV1 1.6 (86%)  Ratio 0.81 p walking/ 1.5 h p saba no curvature  - FENO 03/24/2018  =   23 on no rx - 03/24/2018  After extensive coaching inhaler device,  effectiveness =    50% with hfa with baseline maybe 25% > try symb 80 2bid x 2 weeks >  Def improvement and worse off it so 05/08/2018 rec symb 80 x 2 each am and pm dose prn   Will re-eval in 6 weeks in person with all meds in hand using a trust but verify approach to confirm accurate Medication  Reconciliation The principal here is that until we are certain that the  patients are doing what we've asked, it makes no sense to ask them to do more.

## 2018-05-08 NOTE — Patient Instructions (Signed)
Plan A = Automatic = symbicort 80 Take 2 puffs first thing in am automatically  and then another 2 puffs about 12 hours later if you feel you need the pm doses      Plan B = Backup Only use your albuterol inhaler as a rescue medication to be used if you can't catch your breath by resting or doing a relaxed purse lip breathing pattern.  - The less you use it, the better it will work when you need it. - Ok to use the inhaler up to 2 puffs  every 4 hours if you must but call for appointment if use goes up over your usual need - Don't leave home without it !!  (think of it like the spare tire for your car)     Please schedule a follow up office visit in 6 weeks, call sooner if needed with all medications /inhalers/ solutions in hand so we can verify exactly what you are taking. This includes all medications from all doctors and over the counters

## 2019-04-01 ENCOUNTER — Non-Acute Institutional Stay: Payer: Medicare PPO | Admitting: Nurse Practitioner

## 2019-04-01 ENCOUNTER — Encounter: Payer: Self-pay | Admitting: Nurse Practitioner

## 2019-04-01 ENCOUNTER — Other Ambulatory Visit: Payer: Self-pay

## 2019-04-01 DIAGNOSIS — F304 Manic episode in full remission: Secondary | ICD-10-CM

## 2019-04-01 DIAGNOSIS — K219 Gastro-esophageal reflux disease without esophagitis: Secondary | ICD-10-CM | POA: Diagnosis not present

## 2019-04-01 DIAGNOSIS — F411 Generalized anxiety disorder: Secondary | ICD-10-CM

## 2019-04-01 DIAGNOSIS — M549 Dorsalgia, unspecified: Secondary | ICD-10-CM

## 2019-04-01 DIAGNOSIS — G20 Parkinson's disease: Secondary | ICD-10-CM

## 2019-04-01 DIAGNOSIS — I10 Essential (primary) hypertension: Secondary | ICD-10-CM

## 2019-04-01 DIAGNOSIS — J45991 Cough variant asthma: Secondary | ICD-10-CM

## 2019-04-01 NOTE — Assessment & Plan Note (Signed)
Prn Alprazolam is available to help the patient sleep at night,

## 2019-04-01 NOTE — Assessment & Plan Note (Signed)
Resting tremor in hands/fingers, self dc'd Sinemet due to nightmares/going crazy/restless at night. She doesn't want to take it and f/u Neurology.

## 2019-04-01 NOTE — Assessment & Plan Note (Addendum)
C/o right upper back pain when leaning against something for a few weeks, deep breath or movement doesn't cause pain, X-ray T spine, right ribs, right scapula to r/o fx, may consider therapy.

## 2019-04-01 NOTE — Progress Notes (Signed)
Location:   clinic [facility]   Place of Service:  Clinic (12) Provider: Marlana Latus NP  Code Status: DNR Goals of Care: [place] 01/29/2018  Does Patient Have a Medical Advance Directive? Yes  Type of Paramedic of [place];Living will  Does patient want to make changes to medical advance directive? No - Patient declined  Would patient like information on creating a medical advance directive? -  Pre-existing out of facility DNR order (yellow form or pink MOST form) -     Chief Complaint  Patient presents with  . Acute Visit    Back pain    HPI: Patient is a 79 y.o. female seen today for an acute visit for lower back pain    Hx of HTN, blood pressure is controlled, on Losartan 50mg  qd, Zocor 20mg  qd.  Her mood is stable on Lithium, Alprazolam. Cough/asthma  stable, on Symbicort bid, Allegra 180mg  qd, prn ProAir. GERD, stable, on Omeprazole 20mg  bid. Parkinson's disease, off Sinemet 25/100mg  tid prescribed by Neurology. OA, prn Zanaflex, Tramadol.   Past Medical History:  Diagnosis Date  . Anxiety   . Cardiac conduction disorder 03/30/2012   Overview:  STORY: ETT 03/09/2012 Echo 03/07/2012 also normal Dr Karilyn Cota: ?sick sinus syndrome.  HR is in the 40's.  Increased fatigue over the last month.  BP is stable   . Diverticulosis of colon   . 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 History of colon polyps 10/24/2008  . Hypertension   . Manic disorder, single episode, in full remission ([place]) 12/01/2009  . Sixth nerve palsy     Past Surgical History:  Procedure Laterality Date  . BILATERAL SALPINGOOPHORECTOMY  09/22/2007  . ILEOCECETOMY  09/22/2007  . OVARIAN CYST REMOVAL Right 1967  . SMALL INTESTINE SURGERY  09/22/2007  . TOTAL VAGINAL HYSTERECTOMY  1986   Fibroids    Allergies  Allergen Reactions  . Lisinopril Cough  . Penicillins Itching  . Adhesive [Tape] Itching  . Atorvastatin Itching  . Dilaudid  [Hydromorphone Hcl] Itching  . Hydromorphone Itching    Allergies as of 04/01/2019      Reactions   Lisinopril Cough   Penicillins Itching   Adhesive [tape] Itching   Atorvastatin Itching   Dilaudid [hydromorphone Hcl] Itching   Hydromorphone Itching      Medication List       Accurate as of April 01, 2019 11:59 PM. If you have any questions, ask your nurse or doctor.        STOP taking these medications   ProAir HFA 108 (90 Base) MCG/ACT inhaler Generic drug: albuterol Stopped by: Sakara Lehtinen X Garlin Batdorf, NP     TAKE these medications   ALPRAZolam 0.5 MG 24 hr tablet Commonly known as: XANAX XR Take 0.5 mg by mouth 2 (two) times daily as needed for anxiety.   budesonide-formoterol 80-4.5 MCG/ACT inhaler Commonly known as: Symbicort Inhale 2 puffs into the lungs 2 (two) times daily.   carbidopa-levodopa 25-100 MG tablet Commonly known as: SINEMET IR Take 25-100 tablets by mouth in the morning, at noon, and at bedtime.   diclofenac Sodium 1 % Gel Commonly known as: VOLTAREN Place 1 application onto the skin as needed.   diphenhydramine-acetaminophen 25-500 MG Tabs tablet Commonly known as: TYLENOL PM Take 1 tablet by mouth at bedtime as needed.   dorzolamide 2 % ophthalmic solution Commonly known as: TRUSOPT Place 1 drop into the left eye 2 (two) times daily.  fexofenadine 180 MG tablet Commonly known as: ALLEGRA Take 180 mg by mouth daily.   latanoprost 0.005 % ophthalmic solution Commonly known as: XALATAN Place 1 drop into both eyes at bedtime.   lithium carbonate 300 MG capsule Take 300 mg by mouth 2 (two) times daily with a meal.   losartan 50 MG tablet Commonly known as: COZAAR Take 50 mg by mouth daily.   multivitamin tablet Take 1 tablet by mouth daily.   omeprazole 10 MG capsule Commonly known as: PRILOSEC Take 20 mg by mouth daily.   polyethylene glycol powder 17 GM/SCOOP powder Commonly known as: MiraLax Take 8.5-34 g by mouth 2 (two) times  daily as needed for moderate constipation or severe constipation. To correct constipation.  Adjust dose over 1-2 months.  Goal = ~1 bowel movement / day   simvastatin 20 MG tablet Commonly known as: ZOCOR Take 20 mg by mouth daily.   tiZANidine 2 MG tablet Commonly known as: ZANAFLEX Take 2 tablets by mouth at bedtime as needed.   traMADol 50 MG tablet Commonly known as: ULTRAM Take 50 tablets by mouth 2 (two) times daily as needed.       Review of Systems:  Review of Systems  Constitutional: Negative for activity change, appetite change and fatigue.  HENT: Positive for hearing loss. Negative for congestion, trouble swallowing and voice change.   Eyes: Negative for visual disturbance.       Glaucoma  Respiratory: Negative for cough, shortness of breath and wheezing.   Cardiovascular: Negative for chest pain, palpitations and leg swelling.  Gastrointestinal: Negative for abdominal distention, abdominal pain, constipation, diarrhea, nausea and vomiting.  Genitourinary: Negative for difficulty urinating, dysuria, frequency and urgency.  Musculoskeletal: Positive for arthralgias and gait problem.       Walker. Neck, back, knee pain  Skin: Negative for color change and pallor.  Neurological: Positive for tremors.       Tremors in hands/fingers at rest.   Psychiatric/Behavioral: Positive for dysphoric mood and sleep disturbance. Negative for agitation, behavioral problems and hallucinations. The patient is nervous/anxious.        Started with Sinemet.     Health Maintenance  Topic Date Due  . DEXA SCAN  Never done  . TETANUS/TDAP  08/19/2021  . INFLUENZA VACCINE  Completed  . PNA vac Low Risk Adult  Completed    Physical Exam: Vitals:   04/01/19 1623  BP: (!) 142/84  Pulse: (!) 55  Temp: 98.6 F (37 C)  SpO2: 98%  Weight: 152 lb 9.6 oz (69.2 kg)  Height: 5\' 2"  (1.575 m)   Body mass index is 27.91 kg/m. Physical Exam Vitals and nursing note reviewed.   Constitutional:      General: She is not in acute distress.    Appearance: Normal appearance. She is not ill-appearing, toxic-appearing or diaphoretic.  HENT:     Head: Normocephalic and atraumatic.     Nose: Nose normal.     Mouth/Throat:     Mouth: Mucous membranes are moist.  Eyes:     Extraocular Movements: Extraocular movements intact.     Conjunctiva/sclera: Conjunctivae normal.     Pupils: Pupils are equal, round, and reactive to light.  Cardiovascular:     Rate and Rhythm: Normal rate and regular rhythm.     Heart sounds: No murmur.  Pulmonary:     Breath sounds: No wheezing, rhonchi or rales.  Abdominal:     General: Bowel sounds are normal. There is no distension.  Palpations: Abdomen is soft.     Tenderness: There is no abdominal tenderness. There is no right CVA tenderness, left CVA tenderness, guarding or rebound.  Musculoskeletal:     Cervical back: Normal range of motion and neck supple.     Right lower leg: No edema.     Left lower leg: No edema.  Skin:    General: Skin is warm and dry.  Neurological:     General: No focal deficit present.     Mental Status: She is alert and oriented to person, place, and time. Mental status is at baseline.     Motor: No weakness.     Coordination: Coordination abnormal.     Gait: Gait abnormal.  Psychiatric:        Mood and Affect: Mood normal.        Behavior: Behavior normal.        Thought Content: Thought content normal.        Judgment: Judgment normal.     Labs reviewed: Basic Metabolic Panel: No results for input(s): NA, K, CL, CO2, GLUCOSE, BUN, CREATININE, CALCIUM, MG, PHOS, TSH in the last 8760 hours. Liver Function Tests: No results for input(s): AST, ALT, ALKPHOS, BILITOT, PROT, ALBUMIN in the last 8760 hours. No results for input(s): LIPASE, AMYLASE in the last 8760 hours. No results for input(s): AMMONIA in the last 8760 hours. CBC: No results for input(s): WBC, NEUTROABS, HGB, HCT, MCV, PLT in the  last 8760 hours. Lipid Panel: No results for input(s): CHOL, HDL, LDLCALC, TRIG, CHOLHDL, LDLDIRECT in the last 8760 hours. No results found for: HGBA1C  Procedures since last visit: No results found.  Assessment/Plan Upper back pain on right side C/o right upper back pain when leaning against something for a few weeks, deep breath or movement doesn't cause pain, X-ray T spine, right ribs, right scapula to r/o fx, may consider therapy.   Essential (primary) hypertension Blood pressure is controlled, continue Losartan.   GERD (gastroesophageal reflux disease) Stable, continue Omeprazole.   Bipolar I disorder, single manic episode, in full remission Her mood is stable, continue Lithium, Alprazolam, Tylenol pm.   Cough variant asthma Stable, continue Symbicort bid, Allegra 180mg  qd, prn ProAir  Parkinsonism (HCC) Resting tremor in hands/fingers, self dc'd Sinemet due to nightmares/going crazy/restless at night. She doesn't want to take it and f/u Neurology.   Anxiety state Prn Alprazolam is available to help the patient sleep at night,     Labs/tests ordered:  X-ray T spine, R ribs, R scapula.   Next appt:  04/15/2019

## 2019-04-01 NOTE — Assessment & Plan Note (Signed)
Stable, continue Omeprazole.  

## 2019-04-01 NOTE — Assessment & Plan Note (Signed)
Her mood is stable, continue Lithium, Alprazolam, Tylenol pm.

## 2019-04-01 NOTE — Assessment & Plan Note (Signed)
Blood pressure is controlled, continue Losartan 

## 2019-04-01 NOTE — Patient Instructions (Signed)
Update CBC/diff, CMP/eGFR, TSH, lipid panel next week, obtain X-ray thoracic spine, right ribs, right scapular to r/o fractures, may consider therapy if fxs is ruled out.

## 2019-04-01 NOTE — Assessment & Plan Note (Signed)
Stable, continue Symbicort bid, Allegra 180mg  qd, prn ProAir

## 2019-04-02 ENCOUNTER — Encounter: Payer: Self-pay | Admitting: Nurse Practitioner

## 2019-04-06 ENCOUNTER — Emergency Department (HOSPITAL_COMMUNITY)
Admission: EM | Admit: 2019-04-06 | Discharge: 2019-04-06 | Disposition: A | Discharge status: Home / Self Care | Payer: Medicare PPO | Attending: Emergency Medicine | Admitting: Emergency Medicine

## 2019-04-06 ENCOUNTER — Emergency Department (HOSPITAL_COMMUNITY): Payer: Medicare PPO

## 2019-04-06 ENCOUNTER — Encounter (HOSPITAL_COMMUNITY): Payer: Self-pay

## 2019-04-06 ENCOUNTER — Other Ambulatory Visit: Payer: Self-pay

## 2019-04-06 DIAGNOSIS — Z79899 Other long term (current) drug therapy: Secondary | ICD-10-CM | POA: Insufficient documentation

## 2019-04-06 DIAGNOSIS — R0789 Other chest pain: Secondary | ICD-10-CM | POA: Insufficient documentation

## 2019-04-06 DIAGNOSIS — R251 Tremor, unspecified: Secondary | ICD-10-CM | POA: Insufficient documentation

## 2019-04-06 DIAGNOSIS — Z7982 Long term (current) use of aspirin: Secondary | ICD-10-CM | POA: Diagnosis not present

## 2019-04-06 DIAGNOSIS — I10 Essential (primary) hypertension: Secondary | ICD-10-CM

## 2019-04-06 DIAGNOSIS — J45991 Cough variant asthma: Secondary | ICD-10-CM

## 2019-04-06 DIAGNOSIS — F411 Generalized anxiety disorder: Secondary | ICD-10-CM

## 2019-04-06 LAB — CBC WITH DIFFERENTIAL/PLATELET
Abs Immature Granulocytes: 0.02 10*3/uL (ref 0.00–0.07)
Basophils Absolute: 0 10*3/uL (ref 0.0–0.1)
Basophils Relative: 1 %
Eosinophils Absolute: 0 10*3/uL (ref 0.0–0.5)
Eosinophils Relative: 1 %
HCT: 39 % (ref 36.0–46.0)
Hemoglobin: 12.4 g/dL (ref 12.0–15.0)
Immature Granulocytes: 0 %
Lymphocytes Relative: 18 %
Lymphs Abs: 1.5 10*3/uL (ref 0.7–4.0)
MCH: 33.5 pg (ref 26.0–34.0)
MCHC: 31.8 g/dL (ref 30.0–36.0)
MCV: 105.4 fL — ABNORMAL HIGH (ref 80.0–100.0)
Monocytes Absolute: 0.5 10*3/uL (ref 0.1–1.0)
Monocytes Relative: 6 %
Neutro Abs: 6 10*3/uL (ref 1.7–7.7)
Neutrophils Relative %: 74 %
Platelets: 268 10*3/uL (ref 150–400)
RBC: 3.7 MIL/uL — ABNORMAL LOW (ref 3.87–5.11)
RDW: 12.3 % (ref 11.5–15.5)
WBC: 8.1 10*3/uL (ref 4.0–10.5)
nRBC: 0 % (ref 0.0–0.2)

## 2019-04-06 LAB — COMPREHENSIVE METABOLIC PANEL
ALT: 18 U/L (ref 0–44)
AST: 23 U/L (ref 15–41)
Albumin: 4 g/dL (ref 3.5–5.0)
Alkaline Phosphatase: 75 U/L (ref 38–126)
Anion gap: 8 (ref 5–15)
BUN: 17 mg/dL (ref 8–23)
CO2: 22 mmol/L (ref 22–32)
Calcium: 10.8 mg/dL — ABNORMAL HIGH (ref 8.9–10.3)
Chloride: 109 mmol/L (ref 98–111)
Creatinine, Ser: 0.82 mg/dL (ref 0.44–1.00)
GFR calc Af Amer: >60 mL/min (ref >60)
GFR calc non Af Amer: >60 mL/min (ref >60)
Glucose, Bld: 103 mg/dL — ABNORMAL HIGH (ref 70–99)
Potassium: 4.4 mmol/L (ref 3.5–5.1)
Sodium: 139 mmol/L (ref 135–145)
Total Bilirubin: 0.5 mg/dL (ref 0.3–1.2)
Total Protein: 6.7 g/dL (ref 6.5–8.1)

## 2019-04-06 LAB — LITHIUM LEVEL: Lithium Lvl: 0.8 mmol/L (ref 0.60–1.20)

## 2019-04-06 LAB — D-DIMER, QUANTITATIVE: D-Dimer, Quant: 0.58 ug/mL-FEU — ABNORMAL HIGH (ref 0.00–0.50)

## 2019-04-06 LAB — TROPONIN I (HIGH SENSITIVITY)
Troponin I (High Sensitivity): 5 ng/L (ref <18)
Troponin I (High Sensitivity): 6 ng/L (ref <18)

## 2019-04-06 MED ORDER — LOSARTAN POTASSIUM 50 MG PO TABS
50.0000 mg | ORAL_TABLET | Freq: Once | ORAL | Status: DC
  Filled 2019-04-06: qty 1

## 2019-04-06 MED ORDER — ALPRAZOLAM 0.5 MG PO TABS
0.5000 mg | ORAL_TABLET | Freq: Once | ORAL | Status: AC
  Administered 2019-04-06: 0.5 mg via ORAL
  Filled 2019-04-06: qty 1

## 2019-04-06 MED ORDER — FENTANYL CITRATE (PF) 100 MCG/2ML IJ SOLN
50.0000 ug | Freq: Once | INTRAMUSCULAR | Status: AC
  Administered 2019-04-06: 50 ug via INTRAVENOUS
  Filled 2019-04-06: qty 2

## 2019-04-06 MED ORDER — LOSARTAN POTASSIUM 25 MG PO TABS
25.0000 mg | ORAL_TABLET | Freq: Once | ORAL | Status: AC
  Administered 2019-04-06: 25 mg via ORAL
  Filled 2019-04-06: qty 1

## 2019-04-06 NOTE — ED Provider Notes (Signed)
[place] DEPT Provider Note   CSN: [id number] Arrival date & time: 04/06/19  1131     History Chief Complaint  Patient presents with  . Chest Pain  . Tremors    Tracey [name] is a 79 y.o. female.  The history is provided by the patient and medical records. No language interpreter was used.  Chest Pain  Tracey [name] is a 79 y.o. female who presents to the Emergency Department complaining of chest pain and tremors.  She presents to the ED from [facility].  She has squeezing chest pain when she takes a deep breath for about three weeks.  Pain is located in the central chest and is waxing and waning in nature.  She is intermittently sob.  Denies fevers, cough, abdominal pain, N/V.  She has some diarrhea.  She also has been experiencing tremors in her hands and feet.  Tremors come and go.  Sxs are often worse when she tries to lay down.  She has associated anxiety.  She was started on carbidopa/levodopa for parkinson's about a month ago.  She stopped taking it about a week ago due to concern that it was making her symptoms worse.      Past Medical History:  Diagnosis Date  . Anxiety   . Cardiac conduction disorder 03/30/2012   Overview:  STORY: ETT 03/09/2012 Echo 03/07/2012 also normal Dr Karilyn Cota: ?sick sinus syndrome.  HR is in the 40's.  Increased fatigue over the last month.  BP is stable   . Diverticulosis of colon   . GIST (gastrointestinal stroma tumor), malignant, colon ([place])   . History of colon polyps 10/24/2008  . Hypertension   . Major neurocognitive disorder due to Parkinson's disease, possible ([place])    Tremors possible parkinsons  . Manic disorder, single episode, in full remission ([place]) 12/01/2009  . Sixth nerve palsy     Patient Active Problem List   Diagnosis Date Noted  . Upper back pain on right side 04/01/2019  . DOE (dyspnea on exertion) 03/25/2018  . Cough variant asthma 03/24/2018  . Parkinsonism ([place])  11/15/2015  . Diplopia 10/16/2015  . SBO (small bowel obstruction) ([place]) 09/18/2013  . Glaucoma 09/18/2013  . GERD (gastroesophageal reflux disease) 09/18/2013  . HLD (hyperlipidemia) 02/21/2013  . Essential (primary) hypertension 02/21/2013  . Barrett esophagus 02/19/2013  . Cardiac conduction disorder 03/30/2012  . Difficulty hearing 08/20/2011  . 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 Allergic rhinitis 09/14/2010  . Anxiety state 12/22/2009  . Bipolar I disorder, single manic episode, in full remission ([place]) 12/01/2009  . 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 History of colon polyps 10/24/2008  . Arthritis, degenerative 10/24/2008    Past Surgical History:  Procedure Laterality Date  . BILATERAL SALPINGOOPHORECTOMY  09/22/2007  . Gastrointestinal Stroma Tumor,  Other  1960   GIST Surgery  . ILEOCECETOMY  09/22/2007  . OVARIAN CYST REMOVAL Right 1967  . SMALL INTESTINE SURGERY  09/22/2007  . TOTAL VAGINAL HYSTERECTOMY  1986   Fibroids     OB History   No obstetric history on file.     Family History  Problem Relation Age of Onset  . Congestive Heart Failure Mother   . Dementia Mother   . Heart disease Father   . Diabetes Father   . Lung cancer Son     Social History   Tobacco Use  . Smoking status: Never Smoker  .  Smokeless tobacco: Never Used  Substance Use Topics  . Alcohol use: No  . Drug use: No    Home Medications Prior to Admission medications   Medication Sig Start Date End Date Taking? Authorizing Provider  ALPRAZolam Duanne Moron) 0.5 MG tablet Take 0.5 mg by mouth daily as needed for anxiety. 03/19/19  Yes [provider]  aspirin EC 81 MG tablet Take 81 mg by mouth daily.   Yes [provider]  cholecalciferol (VITAMIN D3) 25 MCG (1000 UNIT) tablet Take 1,000 Units by mouth daily.   Yes [provider]  diclofenac Sodium (VOLTAREN) 1 % GEL  Place 1 application onto the skin as needed. 03/17/18  Yes [provider]  diphenhydramine-acetaminophen (TYLENOL PM) 25-500 MG TABS tablet Take 1 tablet by mouth at bedtime as needed.   Yes [provider]  dorzolamide (TRUSOPT) 2 % ophthalmic solution Place 1 drop into the left eye 2 (two) times daily.    Yes [provider]  fexofenadine (ALLEGRA) 180 MG tablet Take 180 mg by mouth daily.   Yes [provider]  latanoprost (XALATAN) 0.005 % ophthalmic solution Place 1 drop into both eyes at bedtime.   Yes [provider]  lithium carbonate 300 MG capsule Take 300 mg by mouth 2 (two) times daily with a meal.    Yes [provider]  losartan (COZAAR) 50 MG tablet Take 50 mg by mouth daily.    Yes [provider]  Multiple Vitamin (MULTIVITAMIN) tablet Take 1 tablet by mouth daily.   Yes [provider]  omeprazole (PRILOSEC) 20 MG capsule Take 20 mg by mouth 2 (two) times daily. 02/03/19  Yes [provider]  polyethylene glycol powder (MIRALAX) powder Take 8.5-34 g by mouth 2 (two) times daily as needed for moderate constipation or severe constipation. To correct constipation.  Adjust dose over 1-2 months.  Goal = ~1 bowel movement / day Patient taking differently: Take 17 g by mouth daily.  09/22/13  Yes Randon Goldsmith, Megan N, PA-C  psyllium (METAMUCIL) 58.6 % packet Take 1 packet by mouth daily.   Yes [provider]  simvastatin (ZOCOR) 40 MG tablet Take 40 mg by mouth at bedtime. 12/26/18  Yes [provider]  tiZANidine (ZANAFLEX) 2 MG tablet Take 2 tablets by mouth at bedtime as needed. 03/10/19  Yes [provider]  traMADol (ULTRAM) 50 MG tablet Take 50 tablets by mouth 2 (two) times daily as needed. 03/19/19  Yes [provider]  budesonide-formoterol (SYMBICORT) 80-4.5 MCG/ACT inhaler Inhale 2 puffs into the lungs 2 (two) times daily. Patient not taking: Reported on 04/06/2019 03/24/18    Tanda Rockers, MD    Allergies    Lisinopril, Penicillins, Adhesive [tape], Atorvastatin, Dilaudid [hydromorphone hcl], and Hydromorphone  Review of Systems   Review of Systems  Cardiovascular: Positive for chest pain.  All other systems reviewed and are negative.   Physical Exam Updated Vital Signs BP (!) 171/77   Pulse (!) 55   Temp (!) 97.5 F (36.4 C) (Oral)   Resp (!) 26   Ht 5\' 2"  (1.575 m)   Wt 65.8 kg   SpO2 98%   BMI 26.52 kg/m   Physical Exam Vitals and nursing note reviewed.  Constitutional:      Appearance: She is well-developed.  HENT:     Head: Normocephalic and atraumatic.  Cardiovascular:     Rate and Rhythm: Normal rate and regular rhythm.     Heart sounds: No murmur.  Pulmonary:     Effort: Pulmonary effort is normal. No respiratory distress.     Breath sounds: Normal breath sounds.  Abdominal:     Palpations: Abdomen is soft.     Tenderness: There is no abdominal tenderness. There is no guarding or rebound.  Musculoskeletal:        General: No swelling or tenderness.  Skin:    General: Skin is warm and dry.  Neurological:     Mental Status: She is alert and oriented to person, place, and time.     Comments: Fine tremor to BUE at rest and intention.  5/5 strength in all four extremities with sensation to light touch intact in all four extremities.   Psychiatric:        Behavior: Behavior normal.     ED Results / Procedures / Treatments   Labs (all labs ordered are listed, but only abnormal results are displayed) Labs Reviewed  COMPREHENSIVE METABOLIC PANEL - Abnormal; Notable for the following components:      Result Value   Glucose, Bld 103 (*)    Calcium 10.8 (*)    All other components within normal limits  CBC WITH DIFFERENTIAL/PLATELET - Abnormal; Notable for the following components:   RBC 3.70 (*)    MCV 105.4 (*)    All other components within normal limits  D-DIMER, QUANTITATIVE (NOT AT [facility]) - Abnormal; Notable for the  following components:   D-Dimer, Quant 0.58 (*)    All other components within normal limits  LITHIUM LEVEL  TROPONIN I (HIGH SENSITIVITY)  TROPONIN I (HIGH SENSITIVITY)    EKG EKG Interpretation  Date/Time:  Tuesday April 06 2019 13:08:28 EDT Ventricular Rate:  56 PR Interval:    QRS Duration: 95 QT Interval:  437 QTC Calculation: 422 R Axis:   47 Text Interpretation: Sinus rhythm Abnormal R-wave progression, early transition Confirmed by Quintella Reichert [phone]) on 04/06/2019 2:09:34 PM   Radiology DG Chest 2 View  Result Date: 04/06/2019 CLINICAL DATA:  Chest pain EXAM: CHEST - 2 VIEW COMPARISON:  09/18/2013 FINDINGS: Cardiac shadow is within normal limits. Mild aortic tortuosity is seen. Patient rotation is noted accentuating the mediastinal markings. No focal infiltrate or sizable effusion is seen. No acute bony abnormality is noted. Chronic scarring is noted in the right middle lobe stable from the prior exam. IMPRESSION: Chronic scarring in the right middle lobe. No other focal abnormality is noted. Electronically Signed   By: Inez Catalina M.D.   On: 04/06/2019 13:28    Procedures Procedures (including critical care time)  Medications Ordered in ED Medications  losartan (COZAAR) tablet 25 mg (has no administration in time range)  ALPRAZolam (XANAX) tablet 0.5 mg (has no administration in time range)  fentaNYL (SUBLIMAZE) injection 50 mcg (50 mcg Intravenous Given 04/06/19 1339)    ED Course  I have reviewed the triage vital signs and the nursing notes.  Pertinent labs & imaging results that were available during my care of the patient were reviewed by me and considered in my medical decision making (see chart for details).    MDM Rules/Calculators/A&P                     Pt here for evaluation of progressive tremor for several weeks, self discontinued her sinemet as well as chest discomfort.  She has a resting and intention tremor on exam but no focal deficits.   Presentation is not c/w acute CVA.  Lithium level wnl, no evidence of  toxicity.  Presentation is not c/w ACS, dissection, PE (age adjusted ddimer is wnl), CHF, pna.  Plan to d/c home with neurology follow up and return precautions.    Pt is on xanax at home, feels anxious - will provide dose in ED.    Final Clinical Impression(s) / ED Diagnoses Final diagnoses:  Atypical chest pain  Tremor    Rx / DC Orders ED Discharge Orders    None       Quintella Reichert, MD 04/06/19 [phone]

## 2019-04-06 NOTE — Progress Notes (Signed)
CSW received a call from pt's DON at [facility] where pt is living in the ILF.  CSW spoke to pt's DON who confirmed identity of the pt and requested an updated due to their concern pt needed a higher level of care.  The DON was aware pt had D/C'd and stated DON would go to "lay eyes" on the pt at the facility.  CSW will continue to follow for D/C needs.  Alphonse Guild. Danecia Underdown  MSW, LCSW, LCAS, CSI Transitions of Care Clinical Social Worker Care Coordination Department Ph: [phone]

## 2019-04-06 NOTE — ED Triage Notes (Signed)
Pt additionally reporting back pain in mid thoracic area.

## 2019-04-06 NOTE — ED Triage Notes (Signed)
Pt arrives via [facility] from an independent living facility- [facility]. Per EMS: Pt has a fairly new diagnosis of Parkinson's and was started on some medication. Pt reported that the medication  "made me crazy" so she stopped taking the medication abruptly. Unknown to what the medication is. Pt additionally has not been informed that Tremors are apart of parkinson's so she is reporting worsening tremors. Pt reports chest pressure beginning this am. Per EMS pt very anxious

## 2019-04-06 NOTE — ED Notes (Signed)
Pt placed on bedpan at this time.

## 2019-04-06 NOTE — Discharge Instructions (Addendum)
Continue your current medications as prescribed.  Please follow up closely with your Neurologist and Family doctor for medication adjustments and recommendations.

## 2019-04-07 LAB — COMPLETE METABOLIC PANEL WITH GFR
AG Ratio: 1.8 (calc) (ref 1.0–2.5)
ALT: 13 U/L (ref 6–29)
AST: 19 U/L (ref 10–35)
Albumin: 4.4 g/dL (ref 3.6–5.1)
Alkaline phosphatase (APISO): 88 U/L (ref 37–153)
BUN: 16 mg/dL (ref 7–25)
CO2: 23 mmol/L (ref 20–32)
Calcium: 11.4 mg/dL — ABNORMAL HIGH (ref 8.6–10.4)
Chloride: 109 mmol/L (ref 98–110)
Creat: 0.91 mg/dL (ref 0.60–0.93)
GFR, Est African American: 70 mL/min/{1.73_m2} (ref >=60)
GFR, Est Non African American: 60 mL/min/{1.73_m2} (ref >=60)
Globulin: 2.5 g/dL (calc) (ref 1.9–3.7)
Glucose, Bld: 127 mg/dL — ABNORMAL HIGH (ref 65–99)
Potassium: 4.3 mmol/L (ref 3.5–5.3)
Sodium: 140 mmol/L (ref 135–146)
Total Bilirubin: 0.4 mg/dL (ref 0.2–1.2)
Total Protein: 6.9 g/dL (ref 6.1–8.1)

## 2019-04-07 LAB — LIPID PANEL
Cholesterol: 284 mg/dL — ABNORMAL HIGH (ref <200)
HDL: 73 mg/dL (ref >=50)
LDL Cholesterol (Calc): 173 mg/dL (calc) — ABNORMAL HIGH
Non-HDL Cholesterol (Calc): 211 mg/dL (calc) — ABNORMAL HIGH (ref <130)
Total CHOL/HDL Ratio: 3.9 (calc) (ref <5.0)
Triglycerides: 225 mg/dL — ABNORMAL HIGH (ref <150)

## 2019-04-07 LAB — CBC WITH DIFFERENTIAL/PLATELET
Absolute Monocytes: 459 cells/uL (ref 200–950)
Basophils Absolute: 49 cells/uL (ref 0–200)
Basophils Relative: 0.6 %
Eosinophils Absolute: 123 cells/uL (ref 15–500)
Eosinophils Relative: 1.5 %
HCT: 37.4 % (ref 35.0–45.0)
Hemoglobin: 12.9 g/dL (ref 11.7–15.5)
Lymphs Abs: 1902 cells/uL (ref 850–3900)
MCH: 33.8 pg — ABNORMAL HIGH (ref 27.0–33.0)
MCHC: 34.5 g/dL (ref 32.0–36.0)
MCV: 97.9 fL (ref 80.0–100.0)
MPV: 9.6 fL (ref 7.5–12.5)
Monocytes Relative: 5.6 %
Neutro Abs: 5666 cells/uL (ref 1500–7800)
Neutrophils Relative %: 69.1 %
Platelets: 297 10*3/uL (ref 140–400)
RBC: 3.82 10*6/uL (ref 3.80–5.10)
RDW: 12 % (ref 11.0–15.0)
Total Lymphocyte: 23.2 %
WBC: 8.2 10*3/uL (ref 3.8–10.8)

## 2019-04-07 LAB — TSH: TSH: 2.94 mIU/L (ref 0.40–4.50)

## 2019-04-08 ENCOUNTER — Encounter: Payer: Self-pay | Admitting: Nurse Practitioner

## 2019-04-08 ENCOUNTER — Other Ambulatory Visit: Payer: Self-pay

## 2019-04-08 ENCOUNTER — Non-Acute Institutional Stay: Payer: Medicare PPO | Admitting: Nurse Practitioner

## 2019-04-08 DIAGNOSIS — M549 Dorsalgia, unspecified: Secondary | ICD-10-CM

## 2019-04-08 DIAGNOSIS — I10 Essential (primary) hypertension: Secondary | ICD-10-CM

## 2019-04-08 DIAGNOSIS — F304 Manic episode in full remission: Secondary | ICD-10-CM | POA: Diagnosis not present

## 2019-04-08 DIAGNOSIS — J45991 Cough variant asthma: Secondary | ICD-10-CM | POA: Diagnosis not present

## 2019-04-08 DIAGNOSIS — K219 Gastro-esophageal reflux disease without esophagitis: Secondary | ICD-10-CM | POA: Diagnosis not present

## 2019-04-08 DIAGNOSIS — G20 Parkinson's disease: Secondary | ICD-10-CM

## 2019-04-08 DIAGNOSIS — E785 Hyperlipidemia, unspecified: Secondary | ICD-10-CM

## 2019-04-08 MED ORDER — LOSARTAN POTASSIUM 50 MG PO TABS: 50.0000 mg | ORAL_TABLET | Freq: Every day | ORAL | 2 refills | Status: DC

## 2019-04-08 MED ORDER — ROPINIROLE HCL 0.5 MG PO TABS: 0.5000 mg | ORAL_TABLET | Freq: Two times a day (BID) | ORAL | 1 refills | Status: DC

## 2019-04-08 MED ORDER — ALPRAZOLAM 0.5 MG PO TABS: 0.5000 mg | ORAL_TABLET | Freq: Two times a day (BID) | ORAL | 1 refills | Status: DC | PRN

## 2019-04-08 MED ORDER — LITHIUM CARBONATE 300 MG PO CAPS: 300.0000 mg | ORAL_CAPSULE | Freq: Two times a day (BID) | ORAL | 2 refills | Status: DC

## 2019-04-08 NOTE — Assessment & Plan Note (Signed)
LDL 173, on Zocor, Hx of intolerance of medications in the past. Continue Diet, exercise life style modification.

## 2019-04-08 NOTE — Assessment & Plan Note (Signed)
Chronic, SOB, continue Symbicort.

## 2019-04-08 NOTE — Progress Notes (Signed)
Location:   clinic [place]   Place of Service:  Clinic (12) Provider: Marlana Latus NP  Code Status: DNR Goals of Care: [place] 04/06/2019  Does Patient Have a Medical Advance Directive? Yes  Type of Advance Directive Living will;Healthcare Power of Attorney  Does patient want to make changes to medical advance directive? -  Would patient like information on creating a medical advance directive? -  Pre-existing out of facility DNR order (yellow form or pink MOST form) -     Chief Complaint  Patient presents with  . Acute Visit    Patient wants a different medication than what her urologist gave her.     HPI: Patient is a 79 y.o. female seen today for medical management of chronic diseases.    ED eval 04/06/19 for atypical chest pain and tremor, worsened with a deep breath, SOB. 04/06/19 CXR ED Chronic scarring in the right middle lobe. No other focal abnormality is noted. Troponin 6(<18), Lithium 0.8(0.6-1.2, D-dimer 0.58(<0.5)  Hx of Parkinson's, stopped Sinemet due to side effects, prn Zanaflex, c/o tremors in hands/fingers, sometimes tremulous allover.  LDL 173, on Zocor 40mg  qd. TSH 2.94  04/05/19  Hx of anxiety, on prn Alprazolam 0.5mg  qd, Lithium 300mg  bid. HTN, blood pressure is controlled on Losartan 50mg  qd. GERD, on Omeprazole 20mg  bid.  Cough/asthma on Symbicort bid.    Past Medical History:  Diagnosis Date  . Anxiety   . Cardiac conduction disorder 03/30/2012   Overview:  STORY: ETT 03/09/2012 Echo 03/07/2012 also normal Dr Karilyn Cota: ?sick sinus syndrome.  HR is in the 40's.  Increased fatigue over the last month.  BP is stable   . Diverticulosis of colon   . GIST (gastrointestinal stroma tumor), malignant, colon ([place])   . History of colon polyps 10/24/2008  . Hypertension   . Major neurocognitive disorder due to Parkinson's disease, possible ([place])    Tremors possible parkinsons  . Manic disorder, single episode, in full remission ([place])  12/01/2009  . Sixth nerve palsy     Past Surgical History:  Procedure Laterality Date  . BILATERAL SALPINGOOPHORECTOMY  09/22/2007  . Gastrointestinal Stroma Tumor,  Other  1960   GIST Surgery  . ILEOCECETOMY  09/22/2007  . OVARIAN CYST REMOVAL Right 1967  . SMALL INTESTINE SURGERY  09/22/2007  . TOTAL VAGINAL HYSTERECTOMY  1986   Fibroids    Allergies  Allergen Reactions  . Lisinopril Cough  . Penicillins Itching  . Adhesive [Tape] Itching  . Atorvastatin Itching  . Dilaudid [Hydromorphone Hcl] Itching  . Hydromorphone Itching    Allergies as of 04/08/2019      Reactions   Lisinopril Cough   Penicillins Itching   Adhesive [tape] Itching   Atorvastatin Itching   Dilaudid [hydromorphone Hcl] Itching   Hydromorphone Itching      Medication List       Accurate as of April 08, 2019 11:59 PM. If you have any questions, ask your nurse or doctor.        STOP taking these medications   diclofenac Sodium 1 % Gel Commonly known as: VOLTAREN Stopped by: Shavawn Stobaugh X Layni Kreamer, NP   traMADol 50 MG tablet Commonly known as: ULTRAM Stopped by: Cherity Blickenstaff X Lashaya Kienitz, NP     TAKE these medications   ALPRAZolam 0.5 MG tablet Commonly known as: XANAX Take 1 tablet (0.5 mg total) by mouth 2 (two) times daily as needed for anxiety. What changed: when to take this Changed by: Victorya Hillman X Makyla Bye,  NP   aspirin EC 81 MG tablet Take 81 mg by mouth daily.   budesonide-formoterol 80-4.5 MCG/ACT inhaler Commonly known as: Symbicort Inhale 2 puffs into the lungs 2 (two) times daily.   cholecalciferol 25 MCG (1000 UNIT) tablet Commonly known as: VITAMIN D3 Take 1,000 Units by mouth daily.   diphenhydramine-acetaminophen 25-500 MG Tabs tablet Commonly known as: TYLENOL PM Take 1 tablet by mouth at bedtime as needed.   dorzolamide 2 % ophthalmic solution Commonly known as: TRUSOPT Place 1 drop into the left eye 2 (two) times daily.   fexofenadine 180 MG tablet Commonly known as: ALLEGRA Take 180 mg by  mouth daily.   latanoprost 0.005 % ophthalmic solution Commonly known as: XALATAN Place 1 drop into both eyes at bedtime.   lithium carbonate 300 MG capsule Take 1 capsule (300 mg total) by mouth 2 (two) times daily with a meal.   losartan 50 MG tablet Commonly known as: COZAAR Take 1 tablet (50 mg total) by mouth daily.   multivitamin tablet Take 1 tablet by mouth daily.   omeprazole 20 MG capsule Commonly known as: PRILOSEC Take 20 mg by mouth 2 (two) times daily.   polyethylene glycol powder 17 GM/SCOOP powder Commonly known as: MiraLax Take 8.5-34 g by mouth 2 (two) times daily as needed for moderate constipation or severe constipation. To correct constipation.  Adjust dose over 1-2 months.  Goal = ~1 bowel movement / day What changed:   how much to take  when to take this  additional instructions   psyllium 58.6 % packet Commonly known as: METAMUCIL Take 1 packet by mouth daily.   rOPINIRole 0.5 MG tablet Commonly known as: Requip Take 1 tablet (0.5 mg total) by mouth 2 (two) times daily at 10 AM and 5 PM. Started by: Shenae Bonanno X Claudie Rathbone, NP   simvastatin 40 MG tablet Commonly known as: ZOCOR Take 40 mg by mouth at bedtime.   tiZANidine 2 MG tablet Commonly known as: ZANAFLEX Take 2 tablets by mouth at bedtime as needed.       Review of Systems:  Review of Systems  Constitutional: Negative for activity change, appetite change and fatigue.  HENT: Positive for hearing loss. Negative for congestion, trouble swallowing and voice change.   Eyes: Negative for visual disturbance.       Glaucoma  Respiratory: Positive for cough and shortness of breath. Negative for wheezing.        Occasionally DOE   Cardiovascular: Negative for chest pain, palpitations and leg swelling.  Gastrointestinal: Negative for abdominal distention, abdominal pain, constipation and diarrhea.  Genitourinary: Negative for difficulty urinating, dysuria, frequency and urgency.  Musculoskeletal:  Positive for arthralgias and gait problem.       Walker. Neck, back, knee pain  Skin: Negative for color change and pallor.  Neurological: Positive for tremors.       Tremors in hands/fingers at rest. Tremulous allover sometimes.   Psychiatric/Behavioral: Positive for dysphoric mood and sleep disturbance. Negative for agitation, behavioral problems and hallucinations. The patient is nervous/anxious.        Started with Sinemet.     Health Maintenance  Topic Date Due  . DEXA SCAN  Never done  . TETANUS/TDAP  08/19/2021  . INFLUENZA VACCINE  Completed  . PNA vac Low Risk Adult  Completed    Physical Exam: Vitals:   04/08/19 1403  BP: 122/70  Pulse: 70  Temp: (!) 97.5 F (36.4 C)  SpO2: 95%  Weight: 135 lb 12.8  oz (61.6 kg)  Height: 5\' 2"  (1.575 m)   Body mass index is 24.84 kg/m. Physical Exam Vitals and nursing note reviewed.  Constitutional:      General: She is not in acute distress.    Appearance: Normal appearance. She is not ill-appearing, toxic-appearing or diaphoretic.  HENT:     Head: Normocephalic and atraumatic.     Nose: Nose normal.     Mouth/Throat:     Mouth: Mucous membranes are moist.  Eyes:     Extraocular Movements: Extraocular movements intact.     Conjunctiva/sclera: Conjunctivae normal.     Pupils: Pupils are equal, round, and reactive to light.  Cardiovascular:     Rate and Rhythm: Normal rate and regular rhythm.     Heart sounds: No murmur.  Pulmonary:     Breath sounds: No wheezing, rhonchi or rales.  Abdominal:     General: Bowel sounds are normal. There is no distension.     Palpations: Abdomen is soft.     Tenderness: There is no abdominal tenderness. There is no right CVA tenderness, left CVA tenderness, guarding or rebound.  Musculoskeletal:     Cervical back: Normal range of motion and neck supple.     Right lower leg: No edema.     Left lower leg: No edema.     Comments: Pain in the right upper back lateral to the right scapula,  palpated, the patient stated pain experienced when lying down, not associated with deep breathing.   Skin:    General: Skin is warm and dry.  Neurological:     General: No focal deficit present.     Mental Status: She is alert and oriented to person, place, and time. Mental status is at baseline.     Motor: No weakness.     Coordination: Coordination abnormal.     Gait: Gait abnormal.  Psychiatric:        Mood and Affect: Mood normal.        Behavior: Behavior normal.        Thought Content: Thought content normal.        Judgment: Judgment normal.     Labs reviewed: Basic Metabolic Panel: Recent Labs    04/05/19 0905 04/06/19 1245  NA 140 139  K 4.3 4.4  CL 109 109  CO2 23 22  GLUCOSE 127* 103*  BUN 16 17  CREATININE 0.91 0.82  CALCIUM 11.4* 10.8*  TSH 2.94  --    Liver Function Tests: Recent Labs    04/05/19 0905 04/06/19 1245  AST 19 23  ALT 13 18  ALKPHOS  --  75  BILITOT 0.4 0.5  PROT 6.9 6.7  ALBUMIN  --  4.0   No results for input(s): LIPASE, AMYLASE in the last 8760 hours. No results for input(s): AMMONIA in the last 8760 hours. CBC: Recent Labs    04/05/19 0905 04/06/19 1245  WBC 8.2 8.1  NEUTROABS 5,666 6.0  HGB 12.9 12.4  HCT 37.4 39.0  MCV 97.9 105.4*  PLT 297 268   Lipid Panel: Recent Labs    04/05/19 0905  CHOL 284*  HDL 73  LDLCALC 173*  TRIG 225*  CHOLHDL 3.9   No results found for: HGBA1C  Procedures since last visit: DG Chest 2 View  Result Date: 04/06/2019 CLINICAL DATA:  Chest pain EXAM: CHEST - 2 VIEW COMPARISON:  09/18/2013 FINDINGS: Cardiac shadow is within normal limits. Mild aortic tortuosity is seen. Patient rotation is noted accentuating the  mediastinal markings. No focal infiltrate or sizable effusion is seen. No acute bony abnormality is noted. Chronic scarring is noted in the right middle lobe stable from the prior exam. IMPRESSION: Chronic scarring in the right middle lobe. No other focal abnormality is noted.  Electronically Signed   By: Inez Catalina M.D.   On: 04/06/2019 13:28    Assessment/Plan  GERD (gastroesophageal reflux disease) Continue Omeprazole, prn TUMs, may consider Famotidine if X-ray workup for the right upper lower back pain.   Cough variant asthma Chronic, SOB, continue Symbicort.   Essential (primary) hypertension Blood pressure is controlled, continue Losartan.   Bipolar I disorder, single manic episode, in full remission On and off anxiety, continue Lithium, prn Alprazolam, f/u Psychiatrist.   Parkinsonism ([place]) Off Sinemet by self due to side effects, prn Xanaflex. Tremulous wax and weans, Lithium may be contributory, will try Requip 1mg  qd.   HLD (hyperlipidemia) LDL 173, on Zocor, Hx of intolerance of medications in the past. Continue Diet, exercise life style modification.   Upper back pain on right side Right upper back lateral to the right scapula, reproduced by palpation, not with deep breathing, stated some it recurs when lying down. Pending x-ray.    Labs/tests ordered: none  Next appt:  04/15/2019

## 2019-04-08 NOTE — Assessment & Plan Note (Addendum)
Continue Omeprazole, prn TUMs, may consider Famotidine if X-ray workup for the right upper lower back pain.

## 2019-04-08 NOTE — Assessment & Plan Note (Signed)
Right upper back lateral to the right scapula, reproduced by palpation, not with deep breathing, stated some it recurs when lying down. Pending x-ray.

## 2019-04-08 NOTE — Assessment & Plan Note (Addendum)
Off Sinemet by self due to side effects, prn Xanaflex. Tremulous wax and weans, Lithium may be contributory, will try Requip 1mg  qd.

## 2019-04-08 NOTE — Assessment & Plan Note (Signed)
Blood pressure is controlled, continue Losartan 

## 2019-04-08 NOTE — Patient Instructions (Signed)
F/u 04/15/19

## 2019-04-08 NOTE — Assessment & Plan Note (Signed)
On and off anxiety, continue Lithium, prn Alprazolam, f/u Psychiatrist.

## 2019-04-09 ENCOUNTER — Encounter: Payer: Self-pay | Admitting: Nurse Practitioner

## 2019-04-15 ENCOUNTER — Encounter: Payer: Medicare PPO | Admitting: Nurse Practitioner

## 2019-04-15 ENCOUNTER — Other Ambulatory Visit: Payer: Self-pay

## 2019-04-19 ENCOUNTER — Other Ambulatory Visit: Payer: Self-pay | Admitting: Nurse Practitioner

## 2019-04-19 ENCOUNTER — Other Ambulatory Visit: Payer: Self-pay

## 2019-04-19 ENCOUNTER — Ambulatory Visit
Admission: RE | Admit: 2019-04-19 | Discharge: 2019-04-19 | Disposition: A | Discharge status: Home / Self Care | Payer: Medicare PPO | Source: Ambulatory Visit | Attending: Nurse Practitioner | Admitting: Nurse Practitioner

## 2019-04-19 DIAGNOSIS — M549 Dorsalgia, unspecified: Secondary | ICD-10-CM

## 2019-04-22 ENCOUNTER — Non-Acute Institutional Stay: Payer: Medicare PPO | Admitting: Nurse Practitioner

## 2019-04-22 ENCOUNTER — Encounter: Payer: Self-pay | Admitting: Nurse Practitioner

## 2019-04-22 ENCOUNTER — Other Ambulatory Visit: Payer: Self-pay

## 2019-04-22 DIAGNOSIS — G20C Parkinsonism, unspecified: Secondary | ICD-10-CM

## 2019-04-22 DIAGNOSIS — G20 Parkinson's disease: Secondary | ICD-10-CM | POA: Diagnosis not present

## 2019-04-22 DIAGNOSIS — K22719 Barrett's esophagus with dysplasia, unspecified: Secondary | ICD-10-CM

## 2019-04-22 DIAGNOSIS — Z8619 Personal history of other infectious and parasitic diseases: Secondary | ICD-10-CM

## 2019-04-22 DIAGNOSIS — E785 Hyperlipidemia, unspecified: Secondary | ICD-10-CM

## 2019-04-22 DIAGNOSIS — J45991 Cough variant asthma: Secondary | ICD-10-CM | POA: Diagnosis not present

## 2019-04-22 DIAGNOSIS — I10 Essential (primary) hypertension: Secondary | ICD-10-CM

## 2019-04-22 DIAGNOSIS — F304 Manic episode in full remission: Secondary | ICD-10-CM

## 2019-04-22 NOTE — Patient Instructions (Signed)
The patient is doing well on Requip, f/u Dr. Lyndel Safe in 3-4 months.

## 2019-04-22 NOTE — Assessment & Plan Note (Signed)
Stable, continue Omeprazole.  

## 2019-04-22 NOTE — Assessment & Plan Note (Signed)
Continue Lithium(since 40s), prn Alprazolam.

## 2019-04-22 NOTE — Assessment & Plan Note (Signed)
25 years ago, left sided abd and side. No posthepatic pain.

## 2019-04-22 NOTE — Assessment & Plan Note (Signed)
Blood pressure is controlled, continue Losartan 

## 2019-04-22 NOTE — Assessment & Plan Note (Addendum)
Better with  tremulous/tremors, didn't tolerate Sinemet, prn Zanaflex for now, Requip 0.5mg  bid, therapy to eval/tx for balance.

## 2019-04-22 NOTE — Progress Notes (Signed)
Location:   clinic [place]   Place of Service:  Clinic (12) Provider: Marlana Latus NP  Code Status: DNR Goals of Care: [place] Advanced Directives 04/06/2019  Does Patient Have a Medical Advance Directive? Yes  Type of Advance Directive Living will;Healthcare Power of Attorney  Does patient want to make changes to medical advance directive? -  Would patient like information on creating a medical advance directive? -  Pre-existing out of facility DNR order (yellow form or pink MOST form) -     Chief Complaint  Patient presents with  . New Admit To SNF    New Patient here today to established care, although she has been seen before today acutely.     HPI: Patient is a 79 y.o. female seen today for medical management of chronic diseases.    Hx of ED eval 04/06/19 for atypical chest pain and tremor, worsened with a deep breath, SOB. 04/06/19 CXR ED Chronic scarring in the right middle lobe. No other focal abnormality is noted. Troponin 6(<18), Lithium 0.8(0.6-1.2, D-dimer 0.58(<0.5)             Hx of Parkinson's, stopped Sinemet due to side effects, prn Zanaflex, much improved on Requip 0.5mg  bid, better tremors in hands/fingers/sometimes tremulous allover.  LDL 173, on Zocor 40mg  qd. TSH 2.94  04/05/19             Hx of anxiety, on prn Alprazolam 0.5mg  qd, Lithium 300mg  bid. HTN, blood pressure is controlled on Losartan 50mg  qd. GERD, on Omeprazole 20mg  bid.  Cough/asthma on Symbicort bid.    Past Medical History:  Diagnosis Date  . Anxiety   . Cardiac conduction disorder 03/30/2012   Overview:  STORY: ETT 03/09/2012 Echo 03/07/2012 also normal Dr Karilyn Cota: ?sick sinus syndrome.  HR is in the 40's.  Increased fatigue over the last month.  BP is stable   . Diverticulosis of colon   . 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 History of colon polyps 10/24/2008  . Hypertension   . Major neurocognitive disorder due to Parkinson's disease, possible ([place])    Tremors possible parkinsons  . Manic disorder, single episode, in full remission ([place]) 12/01/2009  . Sixth nerve palsy     Past Surgical History:  Procedure Laterality Date  . BILATERAL SALPINGOOPHORECTOMY  09/22/2007  . Gastrointestinal Stroma Tumor,  Other  1960   GIST Surgery  . ILEOCECETOMY  09/22/2007  . OVARIAN CYST REMOVAL Right 1967  . SMALL INTESTINE SURGERY  09/22/2007  . TOTAL VAGINAL HYSTERECTOMY  1986   Fibroids    Allergies  Allergen Reactions  . Lisinopril Cough  . Penicillins Itching  . Adhesive [Tape] Itching  . Atorvastatin Itching  . Dilaudid [Hydromorphone Hcl] Itching  . Hydromorphone Itching    Allergies as of 04/22/2019      Reactions   Lisinopril Cough   Penicillins Itching   Adhesive [tape] Itching   Atorvastatin Itching   Dilaudid [hydromorphone Hcl] Itching   Hydromorphone Itching      Medication List       Accurate as of April 22, 2019  4:10 PM. If you have any questions, ask your nurse or doctor.        ALPRAZolam 0.5 MG tablet Commonly known as: XANAX Take 1 tablet (0.5 mg total) by mouth 2 (two) times daily as needed for anxiety.   aspirin EC 81 MG tablet Take 81 mg by mouth daily.   budesonide-formoterol 80-4.5 MCG/ACT inhaler Commonly known as:  Symbicort Inhale 2 puffs into the lungs 2 (two) times daily.   cholecalciferol 25 MCG (1000 UNIT) tablet Commonly known as: VITAMIN D3 Take 1,000 Units by mouth daily.   diphenhydramine-acetaminophen 25-500 MG Tabs tablet Commonly known as: TYLENOL PM Take 1 tablet by mouth at bedtime as needed.   dorzolamide 2 % ophthalmic solution Commonly known as: TRUSOPT Place 1 drop into the left eye 2 (two) times daily.   fexofenadine 180 MG tablet Commonly known as: ALLEGRA Take 180 mg by mouth daily.   latanoprost 0.005 % ophthalmic solution Commonly known as: XALATAN Place 1 drop into both eyes at bedtime.   lithium carbonate 300 MG capsule Take 1 capsule (300 mg total) by mouth 2  (two) times daily with a meal.   losartan 50 MG tablet Commonly known as: COZAAR Take 1 tablet (50 mg total) by mouth daily.   multivitamin tablet Take 1 tablet by mouth daily.   omeprazole 20 MG capsule Commonly known as: PRILOSEC Take 20 mg by mouth 2 (two) times daily.   polyethylene glycol powder 17 GM/SCOOP powder Commonly known as: MiraLax Take 8.5-34 g by mouth 2 (two) times daily as needed for moderate constipation or severe constipation. To correct constipation.  Adjust dose over 1-2 months.  Goal = ~1 bowel movement / day What changed:   how much to take  when to take this  additional instructions   psyllium 58.6 % packet Commonly known as: METAMUCIL Take 1 packet by mouth daily.   rOPINIRole 0.5 MG tablet Commonly known as: Requip Take 1 tablet (0.5 mg total) by mouth 2 (two) times daily at 10 AM and 5 PM.   simvastatin 40 MG tablet Commonly known as: ZOCOR Take 40 mg by mouth at bedtime.   tiZANidine 2 MG tablet Commonly known as: ZANAFLEX Take 2 tablets by mouth at bedtime as needed.       Review of Systems:  Review of Systems  Constitutional: Negative for activity change, appetite change and fatigue.  HENT: Positive for hearing loss. Negative for congestion, trouble swallowing and voice change.   Eyes: Negative for visual disturbance.       Glaucoma  Respiratory: Positive for cough. Negative for shortness of breath and wheezing.        Occasionally DOE   Cardiovascular: Negative for chest pain, palpitations and leg swelling.  Gastrointestinal: Negative for abdominal distention, abdominal pain, constipation and diarrhea.  Genitourinary: Negative for difficulty urinating, dysuria, frequency and urgency.  Musculoskeletal: Positive for arthralgias and gait problem.       Walker. R Neck, back sensitive, X-ray negative for fxs.  R knee pain is chronic.   Skin: Negative for color change.  Neurological: Positive for tremors. Negative for dizziness,  speech difficulty and headaches.       Tremors in hands/fingers at rest. L>R  Psychiatric/Behavioral: Negative for agitation, behavioral problems, dysphoric mood, hallucinations and sleep disturbance. The patient is not nervous/anxious.     Health Maintenance  Topic Date Due  . DEXA SCAN  Never done  . INFLUENZA VACCINE  08/15/2019  . TETANUS/TDAP  08/19/2021  . PNA vac Low Risk Adult  Completed    Physical Exam: Vitals:   04/22/19 1413  BP: 136/74  Pulse: (!) 55  Temp: (!) 97.5 F (36.4 C)  SpO2: 97%  Weight: 146 lb 6.4 oz (66.4 kg)  Height: 5\' 2"  (1.575 m)   Body mass index is 26.78 kg/m. Physical Exam Vitals and nursing note reviewed.  Constitutional:  General: She is not in acute distress.    Appearance: Normal appearance. She is not ill-appearing, toxic-appearing or diaphoretic.  HENT:     Head: Normocephalic and atraumatic.     Nose: Nose normal.     Mouth/Throat:     Mouth: Mucous membranes are moist.  Eyes:     Extraocular Movements: Extraocular movements intact.     Conjunctiva/sclera: Conjunctivae normal.     Pupils: Pupils are equal, round, and reactive to light.  Cardiovascular:     Rate and Rhythm: Normal rate and regular rhythm.     Heart sounds: No murmur.  Pulmonary:     Breath sounds: No wheezing, rhonchi or rales.  Abdominal:     General: Bowel sounds are normal. There is no distension.     Palpations: Abdomen is soft.     Tenderness: There is no abdominal tenderness. There is no guarding or rebound.  Musculoskeletal:     Cervical back: Normal range of motion and neck supple.     Right lower leg: No edema.     Left lower leg: No edema.     Comments: Pain in the right upper back lateral to the right scapula, more in sensitive to touch nature, X-ray was negative for fxs. Chronic R knee pain  Skin:    General: Skin is warm and dry.  Neurological:     General: No focal deficit present.     Mental Status: She is alert and oriented to person,  place, and time. Mental status is at baseline.     Motor: No weakness.     Coordination: Coordination abnormal.     Gait: Gait abnormal.     Comments: Resting tremor in fingers.   Psychiatric:        Mood and Affect: Mood normal.        Behavior: Behavior normal.        Thought Content: Thought content normal.        Judgment: Judgment normal.     Labs reviewed: Basic Metabolic Panel: Recent Labs    04/05/19 0905 04/06/19 1245  NA 140 139  K 4.3 4.4  CL 109 109  CO2 23 22  GLUCOSE 127* 103*  BUN 16 17  CREATININE 0.91 0.82  CALCIUM 11.4* 10.8*  TSH 2.94  --    Liver Function Tests: Recent Labs    04/05/19 0905 04/06/19 1245  AST 19 23  ALT 13 18  ALKPHOS  --  75  BILITOT 0.4 0.5  PROT 6.9 6.7  ALBUMIN  --  4.0   No results for input(s): LIPASE, AMYLASE in the last 8760 hours. No results for input(s): AMMONIA in the last 8760 hours. CBC: Recent Labs    04/05/19 0905 04/06/19 1245  WBC 8.2 8.1  NEUTROABS 5,666 6.0  HGB 12.9 12.4  HCT 37.4 39.0  MCV 97.9 105.4*  PLT 297 268   Lipid Panel: Recent Labs    04/05/19 0905  CHOL 284*  HDL 73  LDLCALC 173*  TRIG 225*  CHOLHDL 3.9   No results found for: HGBA1C  Procedures since last visit: DG Chest 2 View  Result Date: 04/06/2019 CLINICAL DATA:  Chest pain EXAM: CHEST - 2 VIEW COMPARISON:  09/18/2013 FINDINGS: Cardiac shadow is within normal limits. Mild aortic tortuosity is seen. Patient rotation is noted accentuating the mediastinal markings. No focal infiltrate or sizable effusion is seen. No acute bony abnormality is noted. Chronic scarring is noted in the right middle lobe stable from  the prior exam. IMPRESSION: Chronic scarring in the right middle lobe. No other focal abnormality is noted. Electronically Signed   By: Inez Catalina M.D.   On: 04/06/2019 13:28   DG Ribs Unilateral W/Chest Right  Result Date: 04/20/2019 CLINICAL DATA:  Right upper back pain. EXAM: RIGHT RIBS AND CHEST - 3+ VIEW  COMPARISON:  Chest x-ray 04/06/2019. CT chest report 04/24/2012. CT chest 04/02/2011. FINDINGS: Prominent epicardial fat pad. Heart size normal. Density noted over the right mid lung. This could represent a area of atelectasis/infiltrate. Follow-up exam suggested to demonstrate resolution and to exclude underlying mass lesion. No pleural effusion or pneumothorax. Diffuse osteopenia. Degenerative changes and scoliosis thoracic spine. No acute bony abnormality identified. IMPRESSION: 1. Density noted over the right mid lung. This could represent an area of atelectasis/infiltrate. Follow-up exam suggested to demonstrate resolution and to exclude underlying mass lesion. 2. Diffuse osteopenia. Degenerative changes scoliosis thoracic spine. No acute bony abnormality identified. Electronically Signed   By: Marcello Moores  Register   On: 04/20/2019 07:03   DG Thoracic Spine W/Swimmers  Result Date: 04/20/2019 CLINICAL DATA:  Back and chest pain. EXAM: THORACIC SPINE - 3 VIEWS COMPARISON:  Chest x-ray 04/06/2019. CT chest report 04/24/2012. CT chest 04/02/2011. FINDINGS: Density noted in the right mid lung. This could represent a focal area of atelectasis/infiltrate. Follow-up PA and lateral chest x-ray suggested demonstrate resolution to exclude underlying mass lesion. Diffuse osteopenia. Thoracic spine scoliosis and degenerative change. No acute bony abnormality identified. IMPRESSION: 1. Density noted the right mid lung. This could represent a focal area of atelectasis/infiltrate. Follow-up PA and lateral chest x-ray suggested to demonstrate resolution and to exclude underlying mass lesion. 2. Diffuse osteopenia. Thoracic spine scoliosis and degenerative change. No acute bony abnormality identified. Electronically Signed   By: Marcello Moores  Register   On: 04/20/2019 06:54   DG Scapula Right  Result Date: 04/20/2019 CLINICAL DATA:  Right upper back pain. EXAM: RIGHT SCAPULA - 2+ VIEWS COMPARISON:  Chest x-ray 04/06/2019. Chest CT  report 04/24/2012. Chest CT 04/02/2011. FINDINGS: Mild acromioclavicular and glenohumeral degenerative change. No acute bony abnormality identified. Right scapula is intact. Adjacent right ribs are unremarkable. IMPRESSION: Mild acromioclavicular and glenohumeral degenerative change. No acute abnormality. Right scapula is intact. Electronically Signed   By: Marcello Moores  Register   On: 04/20/2019 07:04    Assessment/Plan  Essential (primary) hypertension Blood pressure is controlled, continue Losartan.   Cough variant asthma Lung function test 01/2018, stable, continue Symbicort bid.   Barrett esophagus Stable, continue Omeprazole.   Parkinsonism ([place]) Better with  tremulous/tremors, didn't tolerate Sinemet, prn Zanaflex for now, Requip 0.5mg  bid, therapy to eval/tx for balance.   Bipolar I disorder, single manic episode, in full remission Continue Lithium(since 40s), prn Alprazolam.   HLD (hyperlipidemia) Stable, continue Zocor. LDL 173 04/05/19  History of shingles 25 years ago, left sided abd and side. No posthepatic pain.    Labs/tests ordered:  None  Next appt:  Dr Lyndel Safe 3 months

## 2019-04-22 NOTE — Assessment & Plan Note (Signed)
Lung function test 01/2018, stable, continue Symbicort bid.

## 2019-04-22 NOTE — Assessment & Plan Note (Addendum)
Stable, continue Zocor. LDL 173 04/05/19

## 2019-05-28 ENCOUNTER — Encounter: Payer: Self-pay | Admitting: Internal Medicine

## 2019-05-28 ENCOUNTER — Non-Acute Institutional Stay: Payer: Medicare PPO | Admitting: Internal Medicine

## 2019-05-28 DIAGNOSIS — I10 Essential (primary) hypertension: Secondary | ICD-10-CM

## 2019-05-28 DIAGNOSIS — R112 Nausea with vomiting, unspecified: Secondary | ICD-10-CM | POA: Diagnosis not present

## 2019-05-28 DIAGNOSIS — F304 Manic episode in full remission: Secondary | ICD-10-CM

## 2019-05-28 DIAGNOSIS — F411 Generalized anxiety disorder: Secondary | ICD-10-CM

## 2019-05-28 DIAGNOSIS — E785 Hyperlipidemia, unspecified: Secondary | ICD-10-CM

## 2019-05-28 LAB — HEPATIC FUNCTION PANEL
ALT: 13 (ref 7–35)
AST: 22 (ref 13–35)
Alkaline Phosphatase: 101 (ref 25–125)
Bilirubin, Direct: 0.2 (ref 0.01–0.4)
Bilirubin, Total: 1

## 2019-05-28 LAB — BASIC METABOLIC PANEL
BUN: 26 — AB (ref 4–21)
CO2: 25 — AB (ref 13–22)
Chloride: 102 (ref 99–108)
Creatinine: 1 (ref 0.5–1.1)
Glucose: 132
Potassium: 4.6 (ref 3.4–5.3)
Sodium: 142 (ref 137–147)

## 2019-05-28 LAB — COMPREHENSIVE METABOLIC PANEL
Albumin: 4.6 (ref 3.5–5.0)
Calcium: 11.2 — AB (ref 8.7–10.7)
Globulin: 2.7

## 2019-05-28 LAB — CBC AND DIFFERENTIAL
HCT: 41 (ref 36–46)
Hemoglobin: 14.1 (ref 12.0–16.0)
Neutrophils Absolute: 7439
Platelets: 336 (ref 150–399)
WBC: 8.7

## 2019-05-28 LAB — CBC: RBC: 4.31 (ref 3.87–5.11)

## 2019-05-28 NOTE — Progress Notes (Signed)
This encounter was created in error - please disregard.

## 2019-05-28 NOTE — Progress Notes (Addendum)
Location:   [place] [place] Room Number: [place] of Service:  ALF [phone]) Provider:  Veleta Miners MD   Bernerd Limbo, MD  Patient Care Team: Bernerd Limbo, MD as PCP - General (Family Medicine) Marti Sleigh, MD as Consulting Physician (Gynecologic Oncology) Ladell Pier, MD as Consulting Physician (Oncology) Richmond Campbell, MD as Consulting Physician (Gastroenterology) Clent Jacks, MD as Consulting Physician (Ophthalmology) Jacques Navy, MD as Referring Physician (Neurology)  Extended Emergency Contact Information Primary Emergency Contact: Alija, [place] Mobile Phone: [phone] Relation: Son  Code Status:  Managed care Goals of care: Advanced Directive information Advanced Directives 04/06/2019  Does Patient Have a Medical Advance Directive? Yes  Type of Advance Directive Living will;Healthcare Power of Attorney  Does patient want to make changes to medical advance directive? -  Would patient like information on creating a medical advance directive? -  Pre-existing out of facility DNR order (yellow form or pink MOST form) -     Chief Complaint  Patient presents with  . Acute Visit    Vomiting    HPI:  Pt is a 79 y.o. female seen today for an acute visit for Vomiting and Nausea  Patient has a history of Parkinson diagnosed due to diplopia and hand tremors. Also has history of arthritis , GIST of Small intestine, Bipolar Disorder, Hypertension  She lives by herself in [place] and has been having vomiting since yesterday.  She states that she had ribs for the supper and since then she has been vomiting.  Denies any abdominal pain denies any diarrhea.  No fever chills no coughing no dysuria.  Does feel weak.  She has been transferred from her apartment to [place] for monitoring.  Past Medical History:  Diagnosis Date  . Anxiety   . Cardiac conduction disorder 03/30/2012   Overview:  STORY: ETT 03/09/2012 Echo 03/07/2012 also normal Dr  Karilyn Cota: ?sick sinus syndrome.  HR is in the 40's.  Increased fatigue over the last month.  BP is stable   . Diverticulosis of colon   . GIST (gastrointestinal stroma tumor), malignant, colon ([place])   . History of colon polyps 10/24/2008  . Hypertension   . Major neurocognitive disorder due to Parkinson's disease, possible ([place])    Tremors possible parkinsons  . Manic disorder, single episode, in full remission ([place]) 12/01/2009  . Sixth nerve palsy    Past Surgical History:  Procedure Laterality Date  . BILATERAL SALPINGOOPHORECTOMY  09/22/2007  . Gastrointestinal Stroma Tumor,  Other  1960   GIST Surgery  . ILEOCECETOMY  09/22/2007  . OVARIAN CYST REMOVAL Right 1967  . SMALL INTESTINE SURGERY  09/22/2007  . TOTAL VAGINAL HYSTERECTOMY  1986   Fibroids    Allergies  Allergen Reactions  . Lisinopril Cough  . Penicillins Itching  . Adhesive [Tape] Itching  . Atorvastatin Itching  . Dilaudid [Hydromorphone Hcl] Itching  . Hydromorphone Itching    Allergies as of 05/28/2019      Reactions   Lisinopril Cough   Penicillins Itching   Adhesive [tape] Itching   Atorvastatin Itching   Dilaudid [hydromorphone Hcl] Itching   Hydromorphone Itching      Medication List       Accurate as of May 28, 2019 11:59 PM. If you have any questions, ask your nurse or doctor.        STOP taking these medications   dorzolamide 2 % ophthalmic solution Commonly known as: TRUSOPT Stopped by: Carroll Kinds, CMA   fexofenadine  180 MG tablet Commonly known as: ALLEGRA Stopped by: Carroll Kinds, CMA   latanoprost 0.005 % ophthalmic solution Commonly known as: XALATAN Stopped by: Carroll Kinds, CMA   LORazepam 0.5 MG tablet Commonly known as: ATIVAN Stopped by: [name], MD   psyllium 58.6 % packet Commonly known as: METAMUCIL Stopped by: Carroll Kinds, CMA     TAKE these medications   ALPRAZolam 0.5 MG tablet Commonly known as: XANAX Take 0.5 mg by mouth 2 (two) times  daily as needed for anxiety. What changed: Another medication with the same name was removed. Continue taking this medication, and follow the directions you see here. Changed by: Carroll Kinds, CMA   aspirin EC 81 MG tablet Take 81 mg by mouth daily. What changed: Another medication with the same name was removed. Continue taking this medication, and follow the directions you see here. Changed by: Carroll Kinds, CMA   budesonide-formoterol 80-4.5 MCG/ACT inhaler Commonly known as: SYMBICORT Inhale 2 puffs into the lungs 2 (two) times daily. What changed: Another medication with the same name was removed. Continue taking this medication, and follow the directions you see here. Changed by: Carroll Kinds, CMA   cholecalciferol 25 MCG (1000 UNIT) tablet Commonly known as: VITAMIN D Take 1,000 Units by mouth daily. What changed: Another medication with the same name was removed. Continue taking this medication, and follow the directions you see here. Changed by: Carroll Kinds, CMA   diphenhydramine-acetaminophen 25-500 MG Tabs tablet Commonly known as: TYLENOL PM Take 1 tablet by mouth at bedtime as needed.   lithium carbonate 300 MG capsule Take 300 mg by mouth 2 (two) times daily between meals. What changed: Another medication with the same name was removed. Continue taking this medication, and follow the directions you see here. Changed by: Carroll Kinds, CMA   losartan 50 MG tablet Commonly known as: COZAAR Take 50 mg by mouth daily. What changed: Another medication with the same name was removed. Continue taking this medication, and follow the directions you see here. Changed by: Carroll Kinds, CMA   multivitamin tablet Take 1 tablet by mouth daily.   omeprazole 20 MG capsule Commonly known as: PRILOSEC Take 20 mg by mouth 2 (two) times daily.   ondansetron 4 MG tablet Commonly known as: ZOFRAN Take 4 mg by mouth every 6 (six) hours as needed for nausea.   polyethylene glycol  powder 17 GM/SCOOP powder Commonly known as: MiraLax Take 8.5-34 g by mouth 2 (two) times daily as needed for moderate constipation or severe constipation. To correct constipation.  Adjust dose over 1-2 months.  Goal = ~1 bowel movement / day What changed:   how much to take  when to take this  additional instructions   rOPINIRole 0.5 MG tablet Commonly known as: REQUIP Take 0.5 mg by mouth in the morning and at bedtime. What changed: Another medication with the same name was removed. Continue taking this medication, and follow the directions you see here. Changed by: Carroll Kinds, CMA   simvastatin 40 MG tablet Commonly known as: ZOCOR Take 40 mg by mouth at bedtime.   tiZANidine 2 MG tablet Commonly known as: ZANAFLEX Take 2 tablets by mouth at bedtime as needed.       Review of Systems  Constitutional: Positive for activity change and appetite change.  HENT: Negative.   Respiratory: Negative.   Cardiovascular: Negative.   Gastrointestinal: Positive for nausea and vomiting.  Genitourinary:  Negative.   Musculoskeletal: Negative.   Skin: Negative.   Neurological: Positive for weakness.  Psychiatric/Behavioral: Negative.   All other systems reviewed and are negative.   Immunization History  Administered Date(s) Administered  . Influenza Split 10/24/2008, 09/26/2009, 10/08/2011, 10/05/2012  . Influenza, High Dose Seasonal PF 11/02/2013, 11/02/2013, 11/03/2017, 10/26/2018  . Influenza,inj,Quad PF,6+ Mos 11/01/2014, 10/11/2015, 10/11/2016  . Influenza-Unspecified 10/11/2016  . Moderna SARS-COVID-2 Vaccination 01/18/2019, 02/15/2019  . Pneumococcal Conjugate-13 08/20/2011  . Pneumococcal Polysaccharide-23 01/14/2005, 06/29/2014  . Pneumococcal-Unspecified 01/14/2005, 06/29/2014  . Tdap 08/20/2011  . Zoster 10/08/2011  . Zoster Recombinat (Shingrix) 11/09/2018, 12/01/2018, 03/01/2019   Pertinent  Health Maintenance Due  Topic Date Due  . DEXA SCAN  Never done  .  INFLUENZA VACCINE  08/15/2019  . PNA vac Low Risk Adult  Completed   Fall Risk  04/22/2019 04/08/2019  Falls in the past year? 0 0  Number falls in past yr: 0 0   Functional Status Survey:    Vitals:   05/28/19 1601  BP: 132/64  Pulse: 69  Resp: 18  Temp: 98.2 F (36.8 C)  SpO2: 98%  Weight: 141 lb 3.2 oz (64 kg)  Height: 5\' 2"  (1.575 m)   Body mass index is 25.83 kg/m. Physical Exam  Constitutional: Oriented to person, place, and time. Well-developed and well-nourished.  HENT:  Head: Normocephalic.  Mouth/Throat: Oropharynx is clear and moist.  Eyes: Pupils are equal, round, and reactive to light.  Neck: Neck supple.  Cardiovascular: Normal rate and normal heart sounds.  No murmur heard. Pulmonary/Chest: Effort normal and breath sounds normal. No respiratory distress. No wheezes. She has no rales.  Abdominal: Soft. Bowel sounds are normal. No distension. There is no tenderness. There is no rebound.  Musculoskeletal: No edema.  Lymphadenopathy: none Neurological: Alert and oriented to person, place, and time.  Skin: Skin is warm and dry.  Psychiatric: Normal mood and affect. Behavior is normal. Thought content normal.    Labs reviewed: Recent Labs    04/05/19 0905 04/06/19 1245  NA 140 139  K 4.3 4.4  CL 109 109  CO2 23 22  GLUCOSE 127* 103*  BUN 16 17  CREATININE 0.91 0.82  CALCIUM 11.4* 10.8*   Recent Labs    04/05/19 0905 04/06/19 1245  AST 19 23  ALT 13 18  ALKPHOS  --  75  BILITOT 0.4 0.5  PROT 6.9 6.7  ALBUMIN  --  4.0   Recent Labs    04/05/19 0905 04/06/19 1245  WBC 8.2 8.1  NEUTROABS 5,666 6.0  HGB 12.9 12.4  HCT 37.4 39.0  MCV 97.9 105.4*  PLT 297 268   Lab Results  Component Value Date   TSH 2.94 04/05/2019   No results found for: HGBA1C Lab Results  Component Value Date   CHOL 284 (H) 04/05/2019   HDL 73 04/05/2019   LDLCALC 173 (H) 04/05/2019   TRIG 225 (H) 04/05/2019   CHOLHDL 3.9 04/05/2019    Significant  Diagnostic Results in last 30 days:  No results found.  Assessment/Plan  Acute nausea with nonbilious vomiting Start on Zofran 4 mg Q6 PRN Abdominal Xray to rule out obstruction CBC, Lipase, BMP and Hepatic Panel Will continue Prilosec Vitals Q4 for 24 hours Hold PRN Tramadol  Essential (primary) hypertension On Cozaar  Bipolar I disorder, single manic episode, in full remission ([place]) Continue  Hyperlipidemia, unspecified hyperlipidemia type Continue Zocor Anxiety state PRN xanax   Family/ staff Communication:   Labs/tests ordered:  Total time spent in this patient care encounter was  45_  minutes; greater than 50% of the visit spent counseling patient and staff, reviewing records , Labs and coordinating care for problems addressed at this encounter.

## 2019-05-29 NOTE — Addendum Note (Signed)
Addended by: Georgina Snell on: 05/29/2019 10:04 AM   Modules accepted: Level of Service

## 2019-05-31 ENCOUNTER — Encounter: Payer: Self-pay | Admitting: Nurse Practitioner

## 2019-05-31 ENCOUNTER — Non-Acute Institutional Stay: Payer: Medicare PPO | Admitting: Nurse Practitioner

## 2019-05-31 DIAGNOSIS — F304 Manic episode in full remission: Secondary | ICD-10-CM | POA: Diagnosis not present

## 2019-05-31 DIAGNOSIS — I10 Essential (primary) hypertension: Secondary | ICD-10-CM

## 2019-05-31 DIAGNOSIS — R001 Bradycardia, unspecified: Secondary | ICD-10-CM

## 2019-05-31 DIAGNOSIS — K219 Gastro-esophageal reflux disease without esophagitis: Secondary | ICD-10-CM | POA: Diagnosis not present

## 2019-05-31 DIAGNOSIS — K5901 Slow transit constipation: Secondary | ICD-10-CM | POA: Insufficient documentation

## 2019-05-31 DIAGNOSIS — J45991 Cough variant asthma: Secondary | ICD-10-CM

## 2019-05-31 DIAGNOSIS — G20 Parkinson's disease: Secondary | ICD-10-CM | POA: Diagnosis not present

## 2019-05-31 NOTE — Assessment & Plan Note (Signed)
Blood pressure is controlled, continue Losartan 50mg  qd.

## 2019-05-31 NOTE — Assessment & Plan Note (Signed)
no nausea, vomiting, abd pain is near resolved, KUB constipation, no evidence of bowel obstruction. 05/28/19 wbc 8.7, Hgb 14.1, plt 336, 85.5%, Na 142, K 4.6, Bun 26, creat 1.01, eGFR 53, LFT/lipase/Amylase wnl. Will change MiraLax from prn to daily, obtain UA C/S to r/o UTI with mild left shifting neutrophils 85.5%

## 2019-05-31 NOTE — Assessment & Plan Note (Addendum)
Stable, continue Lithium, prn Alprazolam bid prn.

## 2019-05-31 NOTE — Assessment & Plan Note (Signed)
Stable, continue Omeprazole.  

## 2019-05-31 NOTE — Progress Notes (Signed)
Location:   [facility] Room Number: 528 Place of Service:  ALF [phone]) Provider: Lennie Odor Haven Pylant NP  Bernerd Limbo, MD  Patient Care Team: Bernerd Limbo, MD as PCP - General (Family Medicine) Marti Sleigh, MD as Consulting Physician (Gynecologic Oncology) Ladell Pier, MD as Consulting Physician (Oncology) Richmond Campbell, MD as Consulting Physician (Gastroenterology) Clent Jacks, MD as Consulting Physician (Ophthalmology) Jacques Navy, MD as Referring Physician (Neurology)  Extended Emergency Contact Information Primary Emergency Contact: Dai, Apel Mobile Phone: [phone] Relation: Son  Code Status: DNR Goals of care: Advanced Directive information Advanced Directives 04/06/2019  Does Patient Have a Medical Advance Directive? Yes  Type of Advance Directive Living will;Healthcare Power of Attorney  Does patient want to make changes to medical advance directive? -  Would patient like information on creating a medical advance directive? -  Pre-existing out of facility DNR order (yellow form or pink MOST form) -     Chief Complaint  Patient presents with  . Acute Visit    constipation, slow heart beats    HPI:  Pt is a 79 y.o. female seen today for an acute visit for constipation, no nausea, vomiting, abd pain is near resolved, KUB constipation, no evidence of bowel obstruction. 05/28/19 wbc 8.7, Hgb 14.1, plt 336, 85.5%, Na 142, K 4.6, Bun 26, creat 1.01, eGFR 53, LFT/lipase/Amylase wnl, continued Omeprazole/held Tramadol since 05/28/19  Hx of Parkinson's, stable, on Requip 0.'5mg'$  bid. GERD, stable, on Omeprazole '20mg'$  bid. HTN, blood pressure is controlled on Losartan '50mg'$  qd. Bipolar disorder, stable, on Lithium '300mg'$  bid. COPD, stable, on Symbicort bid. Her mood is stable, on Alprazolam 0.'5mg'$  bid prn.    Past Medical History:  Diagnosis Date  . Anxiety   . Cardiac conduction disorder 03/30/2012   Overview:  STORY: ETT 03/09/2012 Echo 03/07/2012 also  normal Dr Karilyn Cota: ?sick sinus syndrome.  HR is in the 40's.  Increased fatigue over the last month.  BP is stable   . Diverticulosis of colon   . 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 History of colon polyps 10/24/2008  . Hypertension   . Major neurocognitive disorder due to Parkinson's disease, possible ([place])    Tremors possible parkinsons  . Manic disorder, single episode, in full remission ([place]) 12/01/2009  . Sixth nerve palsy    Past Surgical History:  Procedure Laterality Date  . BILATERAL SALPINGOOPHORECTOMY  09/22/2007  . Gastrointestinal Stroma Tumor,  Other  1960   GIST Surgery  . ILEOCECETOMY  09/22/2007  . OVARIAN CYST REMOVAL Right 1967  . SMALL INTESTINE SURGERY  09/22/2007  . TOTAL VAGINAL HYSTERECTOMY  1986   Fibroids    Allergies  Allergen Reactions  . Lisinopril Cough  . Penicillins Itching  . Adhesive [Tape] Itching  . Atorvastatin Itching  . Dilaudid [Hydromorphone Hcl] Itching  . Hydromorphone Itching    Allergies as of 05/31/2019      Reactions   Lisinopril Cough   Penicillins Itching   Adhesive [tape] Itching   Atorvastatin Itching   Dilaudid [hydromorphone Hcl] Itching   Hydromorphone Itching      Medication List       Accurate as of May 31, 2019 11:59 PM. If you have any questions, ask your nurse or doctor.        acetaminophen 500 MG tablet Commonly known as: TYLENOL Take 500 mg by mouth every 6 (six) hours as needed.   ALPRAZolam 0.5 MG tablet Commonly known as: XANAX Take 0.5  mg by mouth 2 (two) times daily as needed for anxiety.   alum & mag hydroxide-simeth 200-200-20 MG/5ML suspension Commonly known as: MAALOX/MYLANTA Take by mouth every 4 (four) hours as needed for indigestion or heartburn.   aspirin EC 81 MG tablet Take 81 mg by mouth daily.   budesonide-formoterol 80-4.5 MCG/ACT inhaler Commonly known as: SYMBICORT Inhale 2 puffs into the lungs 2 (two) times daily.     cholecalciferol 25 MCG (1000 UNIT) tablet Commonly known as: VITAMIN D Take 1,000 Units by mouth daily.   diphenhydramine-acetaminophen 25-500 MG Tabs tablet Commonly known as: TYLENOL PM Take 1 tablet by mouth at bedtime as needed.   dorzolamide 2 % ophthalmic solution Commonly known as: TRUSOPT Place 1 drop into the left eye 2 (two) times daily.   fexofenadine 180 MG tablet Commonly known as: ALLEGRA Take 180 mg by mouth daily.   latanoprost 0.005 % ophthalmic solution Commonly known as: XALATAN Place 1 drop into both eyes at bedtime.   lithium carbonate 300 MG capsule Take 300 mg by mouth 2 (two) times daily between meals.   losartan 50 MG tablet Commonly known as: COZAAR Take 50 mg by mouth daily.   METAMUCIL FIBER PO Take by mouth. 1 packet daily   multivitamin tablet Take 1 tablet by mouth daily.   omeprazole 20 MG capsule Commonly known as: PRILOSEC Take 20 mg by mouth 2 (two) times daily.   ondansetron 4 MG tablet Commonly known as: ZOFRAN Take 4 mg by mouth every 6 (six) hours as needed for nausea or vomiting.   pantoprazole 40 MG tablet Commonly known as: PROTONIX Take 40 mg by mouth daily.   polyethylene glycol powder 17 GM/SCOOP powder Commonly known as: MiraLax Take 8.5-34 g by mouth 2 (two) times daily as needed for moderate constipation or severe constipation. To correct constipation.  Adjust dose over 1-2 months.  Goal = ~1 bowel movement / day What changed:   how much to take  when to take this  additional instructions   rOPINIRole 0.5 MG tablet Commonly known as: REQUIP Take 0.5 mg by mouth in the morning and at bedtime.   simvastatin 40 MG tablet Commonly known as: ZOCOR Take 40 mg by mouth at bedtime.   tiZANidine 2 MG tablet Commonly known as: ZANAFLEX Take 2 tablets by mouth at bedtime as needed.       Review of Systems  Constitutional: Negative for activity change, appetite change and fever.  HENT: Positive for hearing  loss. Negative for congestion and trouble swallowing.   Eyes: Negative for visual disturbance.       Glaucoma  Respiratory: Positive for cough. Negative for shortness of breath.        Occasionally DOE   Cardiovascular: Negative for chest pain, palpitations and leg swelling.  Gastrointestinal: Positive for constipation. Negative for abdominal distention, abdominal pain, diarrhea, nausea and vomiting.  Genitourinary: Negative for difficulty urinating, dysuria, frequency and urgency.  Musculoskeletal: Positive for arthralgias and gait problem.       Walker. R Neck, back sensitive, X-ray negative for fxs.  R knee pain is chronic.   Skin: Negative for color change.  Neurological: Positive for tremors. Negative for speech difficulty and light-headedness.       Tremors in hands/fingers at rest. L>R  Psychiatric/Behavioral: Negative for agitation, behavioral problems and sleep disturbance. The patient is not nervous/anxious.     Immunization History  Administered Date(s) Administered  . Influenza Split 10/24/2008, 09/26/2009, 10/08/2011, 10/05/2012  . Influenza, High  Dose Seasonal PF 11/02/2013, 11/02/2013, 11/03/2017, 10/26/2018  . Influenza,inj,Quad PF,6+ Mos 11/01/2014, 10/11/2015, 10/11/2016  . Influenza-Unspecified 10/11/2016  . Moderna SARS-COVID-2 Vaccination 01/18/2019, 02/15/2019  . Pneumococcal Conjugate-13 08/20/2011  . Pneumococcal Polysaccharide-23 01/14/2005, 06/29/2014  . Pneumococcal-Unspecified 01/14/2005, 06/29/2014  . Tdap 08/20/2011  . Zoster 10/08/2011  . Zoster Recombinat (Shingrix) 11/09/2018, 12/01/2018, 03/01/2019   Pertinent  Health Maintenance Due  Topic Date Due  . DEXA SCAN  Never done  . INFLUENZA VACCINE  08/15/2019  . PNA vac Low Risk Adult  Completed   Fall Risk  04/22/2019 04/08/2019  Falls in the past year? 0 0  Number falls in past yr: 0 0   Functional Status Survey:    Vitals:   05/31/19 1610  BP: 110/68  Pulse: (!) 51  Resp: 18  Temp: 99.2  F (37.3 C)  SpO2: 98%  Weight: 141 lb 3.2 oz (64 kg)  Height: '5\' 2"'$  (1.575 m)   Body mass index is 25.83 kg/m. Physical Exam Vitals and nursing note reviewed.  Constitutional:      Appearance: Normal appearance.  HENT:     Head: Normocephalic and atraumatic.     Nose: Nose normal.     Mouth/Throat:     Mouth: Mucous membranes are moist.  Eyes:     Extraocular Movements: Extraocular movements intact.     Conjunctiva/sclera: Conjunctivae normal.     Pupils: Pupils are equal, round, and reactive to light.  Cardiovascular:     Rate and Rhythm: Regular rhythm. Bradycardia present.     Heart sounds: No murmur.  Pulmonary:     Breath sounds: No rales.  Abdominal:     General: Bowel sounds are normal. There is no distension.     Palpations: Abdomen is soft.     Tenderness: There is no abdominal tenderness. There is no right CVA tenderness, left CVA tenderness, guarding or rebound.  Musculoskeletal:     Cervical back: Normal range of motion and neck supple.     Right lower leg: No edema.     Left lower leg: No edema.     Comments: Pain in the right upper back lateral to the right scapula, more in sensitive to touch nature, X-ray was negative for fxs. Chronic R knee pain  Skin:    General: Skin is warm and dry.  Neurological:     General: No focal deficit present.     Mental Status: She is alert and oriented to person, place, and time. Mental status is at baseline.     Motor: No weakness.     Coordination: Coordination abnormal.     Gait: Gait abnormal.     Comments: Resting tremor in fingers, improved.   Psychiatric:        Mood and Affect: Mood normal.        Behavior: Behavior normal.        Thought Content: Thought content normal.        Judgment: Judgment normal.     Labs reviewed: Recent Labs    04/05/19 0905 04/06/19 1245  NA 140 139  K 4.3 4.4  CL 109 109  CO2 23 22  GLUCOSE 127* 103*  BUN 16 17  CREATININE 0.91 0.82  CALCIUM 11.4* 10.8*   Recent Labs      04/05/19 0905 04/06/19 1245  AST 19 23  ALT 13 18  ALKPHOS  --  75  BILITOT 0.4 0.5  PROT 6.9 6.7  ALBUMIN  --  4.0   Recent  Labs    04/05/19 0905 04/06/19 1245  WBC 8.2 8.1  NEUTROABS 5,666 6.0  HGB 12.9 12.4  HCT 37.4 39.0  MCV 97.9 105.4*  PLT 297 268   Lab Results  Component Value Date   TSH 2.94 04/05/2019   No results found for: HGBA1C Lab Results  Component Value Date   CHOL 284 (H) 04/05/2019   HDL 73 04/05/2019   LDLCALC 173 (H) 04/05/2019   TRIG 225 (H) 04/05/2019   CHOLHDL 3.9 04/05/2019    Significant Diagnostic Results in last 30 days:  No results found.  Assessment/Plan: Parkinsonism (HCC) Stable, continue Requip.   GERD (gastroesophageal reflux disease) Stable, continue Omeprazole.   Essential (primary) hypertension Blood pressure is controlled, continue Losartan '50mg'$  qd.   Bipolar I disorder, single manic episode, in full remission Stable, continue Lithium, prn Alprazolam bid prn.   Cough variant asthma Stable, underwent lung function test, continue Symbicort.   Slow transit constipation no nausea, vomiting, abd pain is near resolved, KUB constipation, no evidence of bowel obstruction. 05/28/19 wbc 8.7, Hgb 14.1, plt 336, 85.5%, Na 142, K 4.6, Bun 26, creat 1.01, eGFR 53, LFT/lipase/Amylase wnl. Will change MiraLax from prn to daily, obtain UA C/S to r/o UTI with mild left shifting neutrophils 85.5%    Bradycardia Denied SOB, chest pain/pressure, palpitation. HR 48-50bpm upon my examination, will obtain EKG. EKG 04/08/19 showed SR vent rate 58bpm.     Family/ staff Communication: plan of care reviewed with the patient and charge nurse.   Labs/tests ordered:  UA C/S, EKG  Time spend 40 minutes.

## 2019-05-31 NOTE — Assessment & Plan Note (Signed)
Stable, underwent lung function test, continue Symbicort.

## 2019-05-31 NOTE — Assessment & Plan Note (Signed)
Stable, continue Requip

## 2019-05-31 NOTE — Assessment & Plan Note (Addendum)
Denied SOB, chest pain/pressure, palpitation. HR 48-50bpm upon my examination, will obtain EKG. EKG 04/08/19 showed SR vent rate 58bpm.

## 2019-06-01 ENCOUNTER — Encounter: Payer: Self-pay | Admitting: Nurse Practitioner

## 2019-06-01 ENCOUNTER — Non-Acute Institutional Stay: Payer: Medicare PPO | Admitting: Internal Medicine

## 2019-06-01 ENCOUNTER — Encounter: Payer: Self-pay | Admitting: Internal Medicine

## 2019-06-01 DIAGNOSIS — R112 Nausea with vomiting, unspecified: Secondary | ICD-10-CM

## 2019-06-01 DIAGNOSIS — K5900 Constipation, unspecified: Secondary | ICD-10-CM

## 2019-06-01 DIAGNOSIS — R001 Bradycardia, unspecified: Secondary | ICD-10-CM | POA: Diagnosis not present

## 2019-06-01 DIAGNOSIS — E785 Hyperlipidemia, unspecified: Secondary | ICD-10-CM

## 2019-06-01 DIAGNOSIS — I10 Essential (primary) hypertension: Secondary | ICD-10-CM

## 2019-06-01 NOTE — Progress Notes (Signed)
Location: [facility] of Service:  ALF (13)  Provider:   Code Status:  Goals of Care:  Advanced Directives 04/06/2019  Does Patient Have a Medical Advance Directive? Yes  Type of Advance Directive Living will;Healthcare Power of Attorney  Does patient want to make changes to medical advance directive? -  Would patient like information on creating a medical advance directive? -  Pre-existing out of facility DNR order (yellow form or pink MOST form) -     Chief Complaint  Patient presents with  . Acute Visit    HPI: Patient is a 79 y.o. female seen today for an acute visit for Bradycardia,, Constipation, And Hypercalcemia Patient has a history of Parkinson diagnosed due to diplopia and hand tremors. Also has history of arthritis , GIST of Small intestine s/p Resection, Bipolar Disorder, Hypertension  Admitted to AL from her apartment due to vomiting as patient had refused to go to the hospital.  She did not have any abdominal pain.  Was started on Zofran x-ray of abdomen showed constipation no obstruction.  Hepatic panel was normal so was her amylase and lipase and CBC with differential was completely normal She did have elevated calcium of 11.2 Today patient said that her nausea is completely resolved with no vomiting.  But she was complaining of constipation.  No abdominal pain or distention.  Past Medical History:  Diagnosis Date  . Anxiety   . Cardiac conduction disorder 03/30/2012   Overview:  STORY: ETT 03/09/2012 Echo 03/07/2012 also normal Dr Karilyn Cota: ?sick sinus syndrome.  HR is in the 40's.  Increased fatigue over the last month.  BP is stable   . Diverticulosis of colon   . GIST (gastrointestinal stroma tumor), malignant, colon ([place])   . History of colon polyps 10/24/2008  . Hypertension   . Major neurocognitive disorder due to Parkinson's disease, possible ([place])    Tremors possible parkinsons  . Manic disorder, single episode, in  full remission ([place]) 12/01/2009  . Sixth nerve palsy     Past Surgical History:  Procedure Laterality Date  . BILATERAL SALPINGOOPHORECTOMY  09/22/2007  . Gastrointestinal Stroma Tumor,  Other  1960   GIST Surgery  . ILEOCECETOMY  09/22/2007  . OVARIAN CYST REMOVAL Right 1967  . SMALL INTESTINE SURGERY  09/22/2007  . TOTAL VAGINAL HYSTERECTOMY  1986   Fibroids    Allergies  Allergen Reactions  . Lisinopril Cough  . Penicillins Itching  . Adhesive [Tape] Itching  . Atorvastatin Itching  . Dilaudid [Hydromorphone Hcl] Itching  . Hydromorphone Itching    Outpatient Encounter Medications as of 06/01/2019  Medication Sig  . acetaminophen (TYLENOL) 500 MG tablet Take 500 mg by mouth every 6 (six) hours as needed.  . ALPRAZolam (XANAX) 0.5 MG tablet Take 0.5 mg by mouth 2 (two) times daily as needed for anxiety.  Marland Kitchen alum & mag hydroxide-simeth (MAALOX/MYLANTA) 200-200-20 MG/5ML suspension Take by mouth every 4 (four) hours as needed for indigestion or heartburn.  Marland Kitchen aspirin EC 81 MG tablet Take 81 mg by mouth daily.  . budesonide-formoterol (SYMBICORT) 80-4.5 MCG/ACT inhaler Inhale 2 puffs into the lungs 2 (two) times daily.  . cholecalciferol (VITAMIN D) 25 MCG (1000 UNIT) tablet Take 1,000 Units by mouth daily.  . dorzolamide (TRUSOPT) 2 % ophthalmic solution Place 1 drop into the left eye 2 (two) times daily.  . fexofenadine (ALLEGRA) 180 MG tablet Take 180 mg by mouth daily.  Marland Kitchen latanoprost (XALATAN) 0.005 % ophthalmic solution  Place 1 drop into both eyes at bedtime.  Marland Kitchen lithium carbonate 300 MG capsule Take 300 mg by mouth 2 (two) times daily between meals.  Marland Kitchen losartan (COZAAR) 50 MG tablet Take 50 mg by mouth daily.  . Multiple Vitamin (MULTIVITAMIN) tablet Take 1 tablet by mouth daily.  Marland Kitchen omeprazole (PRILOSEC) 20 MG capsule Take 20 mg by mouth 2 (two) times daily.  . ondansetron (ZOFRAN) 4 MG tablet Take 4 mg by mouth every 6 (six) hours as needed for nausea or vomiting.  .  polyethylene glycol powder (MIRALAX) powder Take 8.5-34 g by mouth 2 (two) times daily as needed for moderate constipation or severe constipation. To correct constipation.  Adjust dose over 1-2 months.  Goal = ~1 bowel movement / day (Patient taking differently: Take 17 g by mouth daily. )  . Psyllium (METAMUCIL FIBER PO) Take by mouth. 1 packet daily  . rOPINIRole (REQUIP) 0.5 MG tablet Take 0.5 mg by mouth in the morning and at bedtime.  . simvastatin (ZOCOR) 40 MG tablet Take 40 mg by mouth at bedtime.  Marland Kitchen tiZANidine (ZANAFLEX) 2 MG tablet Take 2 tablets by mouth at bedtime as needed.  . [DISCONTINUED] diphenhydramine-acetaminophen (TYLENOL PM) 25-500 MG TABS tablet Take 1 tablet by mouth at bedtime as needed.  . [DISCONTINUED] pantoprazole (PROTONIX) 40 MG tablet Take 40 mg by mouth daily.   No facility-administered encounter medications on file as of 06/01/2019.    Review of Systems:  Review of Systems  Constitutional: Positive for appetite change.  HENT: Negative.   Respiratory: Negative.   Cardiovascular: Negative.   Gastrointestinal: Positive for constipation.  Genitourinary: Negative.   Musculoskeletal: Positive for gait problem.  Skin: Negative.   Neurological: Positive for weakness.  Psychiatric/Behavioral: Negative.     Health Maintenance  Topic Date Due  . DEXA SCAN  Never done  . INFLUENZA VACCINE  08/15/2019  . TETANUS/TDAP  08/19/2021  . COVID-19 Vaccine  Completed  . PNA vac Low Risk Adult  Completed    Physical Exam: Vitals:   06/01/19 2203  BP: 130/80  Pulse: 63  Resp: 18  Temp: 98.7 F (37.1 C)  SpO2: 99%   There is no height or weight on file to calculate BMI. Physical Exam  Constitutional: Oriented to person, place, and time. Well-developed and well-nourished.  HENT:  Head: Normocephalic.  Mouth/Throat: Oropharynx is clear and moist.  Eyes: Pupils are equal, round, and reactive to light.  Neck: Neck supple.  Cardiovascular: Normal rate and  normal heart sounds.  No murmur heard. Pulmonary/Chest: Effort normal and breath sounds normal. No respiratory distress. No wheezes. She has no rales.  Abdominal: Soft. Bowel sounds are normal. No distension. There is no tenderness. There is no rebound.  Musculoskeletal: No edema.  Lymphadenopathy: none Neurological: Alert and oriented to person, place, and time.  Skin: Skin is warm and dry.  Psychiatric: Normal mood and affect. Behavior is normal. Thought content normal.    Labs reviewed: Basic Metabolic Panel: Recent Labs    04/05/19 0905 04/06/19 1245  NA 140 139  K 4.3 4.4  CL 109 109  CO2 23 22  GLUCOSE 127* 103*  BUN 16 17  CREATININE 0.91 0.82  CALCIUM 11.4* 10.8*  TSH 2.94  --    Liver Function Tests: Recent Labs    04/05/19 0905 04/06/19 1245  AST 19 23  ALT 13 18  ALKPHOS  --  75  BILITOT 0.4 0.5  PROT 6.9 6.7  ALBUMIN  --  4.0   No results for input(s): LIPASE, AMYLASE in the last 8760 hours. No results for input(s): AMMONIA in the last 8760 hours. CBC: Recent Labs    04/05/19 0905 04/06/19 1245  WBC 8.2 8.1  NEUTROABS 5,666 6.0  HGB 12.9 12.4  HCT 37.4 39.0  MCV 97.9 105.4*  PLT 297 268   Lipid Panel: Recent Labs    04/05/19 0905  CHOL 284*  HDL 73  LDLCALC 173*  TRIG 225*  CHOLHDL 3.9   No results found for: HGBA1C  Procedures since last visit: No results found.  Assessment/Plan Bradycardia EKG showed Sinus bradycardia with HR of 49 No AV block ? Lithium Repeat the level of Lithium It  was normal in 3/21 Repeat Labs with Magnesium Asymptomatic right now Discontinue Tylenol pM  Hypercalcemia ? Lithium can cause it PTH in past has been 34  Will repeat PTH and PTH related peptide Also Check Vit D level If everything is normal will need Endocrinology Follow up  Constipation, unspecified constipation type On Xray no Obstruction just Constipation Fleets Enema  Miralax Qd till clear  Acute nausea with nonbilious  vomiting Resolved Advance diet All labs including Lipase and Amylase Hepatic panel normal  Hyperlipidemia, unspecified hyperlipidemia type Zocor Essential (primary) hypertension Losartan Arthritis On Zanaflex per her PCP H/o Bipolar On lithium   Labs/tests ordered:  * No order type specified * Next appt:  08/05/2019  Total time spent in this patient care encounter was 45 _  minutes; greater than 50% of the visit spent counseling patient and staff, reviewing records , Labs and coordinating care for problems addressed at this encounter.

## 2019-06-04 ENCOUNTER — Non-Acute Institutional Stay: Payer: Medicare PPO | Admitting: Internal Medicine

## 2019-06-04 ENCOUNTER — Encounter: Payer: Self-pay | Admitting: Internal Medicine

## 2019-06-04 DIAGNOSIS — E785 Hyperlipidemia, unspecified: Secondary | ICD-10-CM | POA: Diagnosis not present

## 2019-06-04 DIAGNOSIS — G20C Parkinsonism, unspecified: Secondary | ICD-10-CM

## 2019-06-04 DIAGNOSIS — R001 Bradycardia, unspecified: Secondary | ICD-10-CM | POA: Diagnosis not present

## 2019-06-04 DIAGNOSIS — K5900 Constipation, unspecified: Secondary | ICD-10-CM

## 2019-06-04 DIAGNOSIS — R112 Nausea with vomiting, unspecified: Secondary | ICD-10-CM | POA: Diagnosis not present

## 2019-06-04 DIAGNOSIS — I10 Essential (primary) hypertension: Secondary | ICD-10-CM

## 2019-06-04 DIAGNOSIS — G20 Parkinson's disease: Secondary | ICD-10-CM

## 2019-06-04 NOTE — Progress Notes (Signed)
Location:   [place] Room Number: [place] of Service:  ALF [phone])  Provider: Veleta Miners MD   PCP: Bernerd Limbo, MD Patient Care Team: Bernerd Limbo, MD as PCP - General (Family Medicine) Marti Sleigh, MD as Consulting Physician (Gynecologic Oncology) Ladell Pier, MD as Consulting Physician (Oncology) Richmond Campbell, MD as Consulting Physician (Gastroenterology) Clent Jacks, MD as Consulting Physician (Ophthalmology) Jacques Navy, MD as Referring Physician (Neurology)  Extended Emergency Contact Information Primary Emergency Contact: [place], [place] Mobile Phone: [phone] Relation: Son  Code Status: Managed care Goals of care:  Advanced Directive information Advanced Directives 04/06/2019  Does Patient Have a Medical Advance Directive? Yes  Type of Advance Directive Living will;Healthcare Power of Attorney  Does patient want to make changes to medical advance directive? -  Would patient like information on creating a medical advance directive? -  Pre-existing out of facility DNR order (yellow form or pink MOST form) -     Allergies  Allergen Reactions  . Lisinopril Cough  . Penicillins Itching  . Adhesive [Tape] Itching  . Atorvastatin Itching  . Dilaudid [Hydromorphone Hcl] Itching  . Hydromorphone Itching    Chief Complaint  Patient presents with  . Discharge Note    Discharge    HPI:  79 y.o. female was admitted to [facility] for resp[facility] care after having acute episode of nausea and vomiting in her apartment.  Patient has a history of Parkinson diagnosed due to diplopia and hand tremors. Also has history of arthritis, GIST of Small intestine s/p Resection, Bipolar Disorder, Hypertension  Patient had acute episode of vomiting without any abdominal pain.  Refused to go to the hospital.  Admitted to [facility] for monitoring it was started on Zofran as needed.  X-ray of the abdomen showed constipation moderate amount with no  obstruction.  Hepatic panel amylase lipase and CBC were all normal She did have elevated calcium  11.2. Her lithium level was normal. She was treated for her symptoms and now is doing better.  Had a BMP done yesterday which showed normal BUN and creatinine and the calcium is back to normal.  Her PTH level is pending. Vit D was low at 28 Patient is planning to go back to her apartment today  Past Medical History:  Diagnosis Date  . Anxiety   . Cardiac conduction disorder 03/30/2012   Overview:  STORY: ETT 03/09/2012 Echo 03/07/2012 also normal Dr Karilyn Cota: ?sick sinus syndrome.  HR is in the 40's.  Increased fatigue over the last month.  BP is stable   . Diverticulosis of colon   . GIST (gastrointestinal stroma tumor), malignant, colon ([place])   . History of colon polyps 10/24/2008  . Hypertension   . Major neurocognitive disorder due to Parkinson's disease, possible ([place])    Tremors possible parkinsons  . Manic disorder, single episode, in full remission ([place]) 12/01/2009  . Sixth nerve palsy     Past Surgical History:  Procedure Laterality Date  . BILATERAL SALPINGOOPHORECTOMY  09/22/2007  . Gastrointestinal Stroma Tumor,  Other  1960   GIST Surgery  . ILEOCECETOMY  09/22/2007  . OVARIAN CYST REMOVAL Right 1967  . SMALL INTESTINE SURGERY  09/22/2007  . TOTAL VAGINAL HYSTERECTOMY  1986   Fibroids      reports that she has never smoked. She has never used smokeless tobacco. She reports that she does not drink alcohol or use drugs. Social History   Socioeconomic History  . Marital status: Widowed  Spouse name: Not on file  . Number of children: 2  . Years of education: Bachelors  . Highest education level: Not on file  Occupational History  . Not on file  Tobacco Use  . Smoking status: Never Smoker  . Smokeless tobacco: Never Used  Substance and Sexual Activity  . Alcohol use: No  . Drug use: No  . Sexual activity: Not Currently    Birth control/protection:  Post-menopausal  Other Topics Concern  . Not on file  Social History Narrative   Lives at home alone.   Right-handed.   No caffeine use.      Diet: Eats anything, like fruits, vegetables, regular diet      Do you drink/ eat things with caffeine? No      Marital status:  Widowed                             What year were you married ? 1965      Do you live in a house, apartment,assistred living, condo, trailer, etc.)? Apartment      Is it one or more stories?  One Story      How many persons live in your home ? 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  No                            Type & how often       ADVANCED DIRECTIVES (Please bring copies)      Do you have a living will? Yes      Do you have a DNR form? Yes                      If not, do you want to discuss one?       Do you have signed POA?HPOA forms?  Yes               If so, please bring to your appointment      FUNCTIONAL STATUS- To be completed by Spouse / child / Staff       Do you have difficulty bathing or dressing yourself ? No   Do you have difficulty preparing food or eating ? No      Do you have difficulty managing your mediation ? No      Do you have difficulty managing your finances ? No      Do you have difficulty affording your medication ? No      Social Determinants of Health   Financial Resource Strain:   . Difficulty of Paying Living Expenses:   Food Insecurity:   . Worried About Charity fundraiser in the Last Year:   . Arboriculturist in the Last Year:   Transportation Needs:   . Film/video editor (Medical):   Marland Kitchen Lack of Transportation (Non-Medical):   Physical Activity:   . Days of Exercise per Week:   . Minutes of Exercise per Session:   Stress:   . Feeling of Stress :   Social Connections:   . Frequency of  Communication with Friends and Family:   .  Frequency of Social Gatherings with Friends and Family:   . Attends Religious Services:   . Active Member of Clubs or Organizations:   . Attends Archivist Meetings:   Marland Kitchen Marital Status:   Intimate Partner Violence:   . Fear of Current or Ex-Partner:   . Emotionally Abused:   Marland Kitchen Physically Abused:   . Sexually Abused:    Functional Status Survey:    Allergies  Allergen Reactions  . Lisinopril Cough  . Penicillins Itching  . Adhesive [Tape] Itching  . Atorvastatin Itching  . Dilaudid [Hydromorphone Hcl] Itching  . Hydromorphone Itching    Pertinent  Health Maintenance Due  Topic Date Due  . DEXA SCAN  Never done  . INFLUENZA VACCINE  08/15/2019  . PNA vac Low Risk Adult  Completed    Medications: Allergies as of 06/04/2019      Reactions   Lisinopril Cough   Penicillins Itching   Adhesive [tape] Itching   Atorvastatin Itching   Dilaudid [hydromorphone Hcl] Itching   Hydromorphone Itching      Medication List       Accurate as of Jun 04, 2019  4:25 PM. If you have any questions, ask your nurse or doctor.        STOP taking these medications   alum & mag hydroxide-simeth 200-200-20 MG/5ML suspension Commonly known as: MAALOX/MYLANTA Stopped by: [name], MD     TAKE these medications   acetaminophen 500 MG tablet Commonly known as: TYLENOL Take 500 mg by mouth every 6 (six) hours as needed.   ALPRAZolam 0.5 MG tablet Commonly known as: XANAX Take 0.5 mg by mouth 2 (two) times daily as needed for anxiety.   aspirin EC 81 MG tablet Take 81 mg by mouth daily.   budesonide-formoterol 80-4.5 MCG/ACT inhaler Commonly known as: SYMBICORT Inhale 2 puffs into the lungs 2 (two) times daily.   cholecalciferol 25 MCG (1000 UNIT) tablet Commonly known as: VITAMIN D Take 1,000 Units by mouth daily.   dorzolamide 2 % ophthalmic solution Commonly known as: TRUSOPT Place 1 drop into the left eye 2 (two)  times daily.   fexofenadine 180 MG tablet Commonly known as: ALLEGRA Take 180 mg by mouth daily.   latanoprost 0.005 % ophthalmic solution Commonly known as: XALATAN Place 1 drop into both eyes at bedtime.   lithium carbonate 300 MG capsule Take 300 mg by mouth 2 (two) times daily between meals.   losartan 50 MG tablet Commonly known as: COZAAR Take 50 mg by mouth daily.   METAMUCIL FIBER PO Take by mouth. 1 packet daily   multivitamin tablet Take 1 tablet by mouth daily.   omeprazole 20 MG capsule Commonly known as: PRILOSEC Take 20 mg by mouth 2 (two) times daily.   ondansetron 4 MG tablet Commonly known as: ZOFRAN Take 4 mg by mouth every 6 (six) hours as needed for nausea or vomiting.   polyethylene glycol powder 17 GM/SCOOP powder Commonly known as: MiraLax Take 8.5-34 g by mouth 2 (two) times daily as needed for moderate constipation or severe constipation. To correct constipation.  Adjust dose over 1-2 months.  Goal = ~1 bowel movement / day What changed:   how much to take  when to take this  additional instructions   rOPINIRole 0.5 MG tablet Commonly known as: REQUIP Take 0.5 mg by mouth in the morning and at bedtime.   simvastatin 40 MG tablet Commonly known as: ZOCOR Take 40 mg by mouth at  bedtime.   tiZANidine 2 MG tablet Commonly known as: ZANAFLEX Take 2 tablets by mouth at bedtime as needed.       Review of Systems   Vitals:   06/04/19 1620  BP: (!) 142/76  Pulse: 63  Resp: 19  Temp: 97.9 F (36.6 C)  SpO2: 97%  Weight: 141 lb 3.2 oz (64 kg)  Height: 5\' 2"  (1.575 m)   Body mass index is 25.83 kg/m. Physical Exam  Constitutional: Oriented to person, place, and time. Well-developed and well-nourished.  HENT:  Head: Normocephalic.  Mouth/Throat: Oropharynx is clear and moist.  Eyes: Pupils are equal, round, and reactive to light.  Neck: Neck supple.  Cardiovascular: Normal rate and normal heart sounds.  No murmur  heard. Pulmonary/Chest: Effort normal and breath sounds normal. No respiratory distress. No wheezes. She has no rales.  Abdominal: Soft. Bowel sounds are normal. No distension. There is no tenderness. There is no rebound.  Musculoskeletal: No edema.  Lymphadenopathy: none Neurological: Alert and oriented to person, place, and time.  Skin: Skin is warm and dry.  Psychiatric: Normal mood and affect. Behavior is normal. Thought content normal.   Labs reviewed: Basic Metabolic Panel: Recent Labs    04/05/19 0905 04/06/19 1245 05/28/19 0000  NA 140 139 142  K 4.3 4.4 4.6  CL 109 109 102  CO2 23 22 25*  GLUCOSE 127* 103*  --   BUN 16 17 26*  CREATININE 0.91 0.82 1.0  CALCIUM 11.4* 10.8* 11.2*   Liver Function Tests: Recent Labs    04/05/19 0905 04/06/19 1245 05/28/19 0000  AST 19 23 22   ALT 13 18 13   ALKPHOS  --  75 101  BILITOT 0.4 0.5  --   PROT 6.9 6.7  --   ALBUMIN  --  4.0 4.6   No results for input(s): LIPASE, AMYLASE in the last 8760 hours. No results for input(s): AMMONIA in the last 8760 hours. CBC: Recent Labs    04/05/19 0905 04/06/19 1245 05/28/19 0000  WBC 8.2 8.1 8.7  NEUTROABS 5,666 6.0 7,439  HGB 12.9 12.4 14.1  HCT 37.4 39.0 41  MCV 97.9 105.4*  --   PLT 297 268 336   Cardiac Enzymes: No results for input(s): CKTOTAL, CKMB, CKMBINDEX, TROPONINI in the last 8760 hours. BNP: Invalid input(s): POCBNP CBG: No results for input(s): GLUCAP in the last 8760 hours.  Procedures and Imaging Studies During Stay: No results found.  Assessment/Plan:   Acute nausea with nonbilious vomiting Work-up was completely negative Patient was treated symptomatically with Zofran And doing well now Bradycardia Was found on normal exam Her EKG showed sinus bradycardia with heart rate of 49 Her labs were completely normal including the magnesium Later on her vitals were all normal with heart rate of 60 Patient was asymptomatic Tylenol PM was  discontinued GERD Also on Prilosec  Hypercalcemia Repeat calcium level was normal It seems like it has been high in the past with PTH level 34 Repeat PTH level is pending If hypercalcemia persist we did talk about referral/decreasing the dose of lithium Lithium level was normal  Hyperlipidemia, unspecified hyperlipidemia type Continue on Zocor Constipation, unspecified constipation type As per abdominal x-ray Was treated with enema and MiraLAX Essential (primary) hypertension Continue losartan Parkinsonism, unspecified Parkinsonism type ([place]) Has not tolerated Sinemet in the past.  She states that she has been doing very well on Requip Arthritis On Zanaflex per her PCP Tramadol was discontinued due to nausea vomiting H/o Bipolar On  lithium We did discuss about eventually changing the dose.  Patient has been on this dose for 40 years. Vitamin D deficiency Continue vitamin D supplement  Cough variant asthma On Symbicort and Allegra  Patient has been advised to f/u with their PCP in 1-2 weeks to bring them up to date on their rehab stay.  Social services at facility was responsible for arranging this appointment.  Pt was provided with a 30 day supply of prescriptions for medications and refills must be obtained from their PCP.  For controlled substances, a more limited supply may be provided adequate until PCP appointment only.  Future labs/tests needed:

## 2019-06-07 ENCOUNTER — Other Ambulatory Visit: Payer: Self-pay | Admitting: Nurse Practitioner

## 2019-06-08 ENCOUNTER — Other Ambulatory Visit: Payer: Self-pay

## 2019-06-08 MED ORDER — ROPINIROLE HCL 0.5 MG PO TABS: 0.5000 mg | ORAL_TABLET | Freq: Two times a day (BID) | ORAL | 1 refills | Status: DC

## 2019-06-08 NOTE — Telephone Encounter (Signed)
Incoming call received from [employer] with [facility] questioning why we keep denying rx for Requip with the indication patient is no longer under prescribers care.   I advised Maudie Mercury that according to our records and in [place] ([place]) patient is listed as being under the care of Dr.Bouska.   Patient was discharged on Friday 06/04/2019 and was to be provided a 30 day supply of rx's from our provider per discharge protocol for [facility]. Although I did not see an electronic script sent in Epic for requip, I am not certain whether or not patient was given rx via manual script (documentation states she was given a 30 day supply of all medications per Dr.Gupta's note). I informed Maudie Mercury I will call patient to clarify  I had a brief conversation with [place] (Medical assistant for [facility]) and she was unable to determine patients status as a PSC, Mickey Farber provided the information about patient being listed under Dr.Bouska'a care in [facility].Marland Kitchen  Spoke with patient, patient states she switched her care over to [facility] when she moved to [facility] (PCP was never changed to our provider and this encounter is a result of not having the correct PCP listed/changed).  Patient has 1 pill left of requip and is asking for a 90 day supply.

## 2019-06-17 ENCOUNTER — Non-Acute Institutional Stay: Payer: Medicare PPO | Admitting: Nurse Practitioner

## 2019-06-17 ENCOUNTER — Encounter: Payer: Self-pay | Admitting: Nurse Practitioner

## 2019-06-17 ENCOUNTER — Other Ambulatory Visit: Payer: Self-pay

## 2019-06-17 DIAGNOSIS — F411 Generalized anxiety disorder: Secondary | ICD-10-CM | POA: Diagnosis not present

## 2019-06-17 DIAGNOSIS — R001 Bradycardia, unspecified: Secondary | ICD-10-CM

## 2019-06-17 DIAGNOSIS — M25561 Pain in right knee: Secondary | ICD-10-CM | POA: Insufficient documentation

## 2019-06-17 DIAGNOSIS — K219 Gastro-esophageal reflux disease without esophagitis: Secondary | ICD-10-CM

## 2019-06-17 DIAGNOSIS — G8929 Other chronic pain: Secondary | ICD-10-CM

## 2019-06-17 DIAGNOSIS — J45991 Cough variant asthma: Secondary | ICD-10-CM

## 2019-06-17 DIAGNOSIS — I10 Essential (primary) hypertension: Secondary | ICD-10-CM

## 2019-06-17 DIAGNOSIS — F304 Manic episode in full remission: Secondary | ICD-10-CM | POA: Diagnosis not present

## 2019-06-17 DIAGNOSIS — G20 Parkinson's disease: Secondary | ICD-10-CM

## 2019-06-17 DIAGNOSIS — K5901 Slow transit constipation: Secondary | ICD-10-CM

## 2019-06-17 NOTE — Assessment & Plan Note (Signed)
Stable, underwent lung function test, continue Symbicort.

## 2019-06-17 NOTE — Assessment & Plan Note (Signed)
Controlled blood pressure, continue Losartan. 

## 2019-06-17 NOTE — Assessment & Plan Note (Signed)
Stable, continue Omeprazole.  

## 2019-06-17 NOTE — Assessment & Plan Note (Signed)
Asymptomatic, improved to 50s from 40s after Tylenol PM stopped.

## 2019-06-17 NOTE — Progress Notes (Signed)
Location:   [place] Room Number: 40 Place of Service:  SNF (31) Provider: Marlana Latus NP  Code Status: DNR Goals of Care: [place] Advanced Directives 04/06/2019  Does Patient Have a Medical Advance Directive? Yes  Type of Advance Directive Living will;Healthcare Power of Attorney  Does patient want to make changes to medical advance directive? -  Would patient like information on creating a medical advance directive? -  Pre-existing out of facility DNR order (yellow form or pink MOST form) -     Chief Complaint  Patient presents with   Medical Management of Chronic Issues    Patient returns to the clinic for follow up.     HPI: Patient is a 79 y.o. female seen today for medical management of chronic diseases.    Saw Dr. Lyndel Safe 06/04/19 for dc from respite stay due to nausea, vomiting. Hx of GIST of small intestine s/p resection.   constipation, resolved, KUB constipation, no evidence of bowel obstruction. 05/28/19 wbc 8.7, Hgb 14.1, plt 336, 85.5%, Na 142, K 4.6, Bun 26, creat 1.01, eGFR 53, LFT/lipase/Amylase wnl, continued Omeprazole/held Tramadol since 05/28/19. Prn Metamucil, MiraLax are effective.              Hx of Parkinson's, stable(diplopia R+L lateral,  tremor in fingers), on Requip 0.71m bid. GERD, stable, on Omeprazole 278mbid. HTN, blood pressure is controlled on Losartan 5053md. Bipolar disorder, stable, on Lithium 300m66md. COPD, stable, on Symbicort bid. Her mood is stable, on Alprazolam 0.5mg 43m prn. Bradycardia, improved from 50s from 40s,  normalized after Tylenol PM dc'd. Hypercalcemia, Ca normalized per Dr. GuptaLyndel Safe, PTH unable to locate  Past Medical History:  Diagnosis Date   Anxiety    Cardiac conduction disorder 03/30/2012   Overview:  STORY: ETT 03/09/2012 Echo 03/07/2012 also normal Dr GanjiKarilyn Cotack sinus syndrome.  HR is in the 40's.  Increased fatigue over the last month.  BP is stable    Diverticulosis of colon    GIST  (gastrointestinal stroma tumor), malignant, colon ([facility]) [place] of colon polyps 10/24/2008   Hypertension    Major neurocognitive disorder due to Parkinson's disease, possible (HCC) [place]ucksTremors possible parkinsons   Manic disorder, single episode, in full remission (HCC) [place]18/2011   Sixth nerve palsy     Past Surgical History:  Procedure Laterality Date   BILATERAL SALPINGOOPHORECTOMY  09/22/2007   Gastrointestinal Stroma Tumor,  Other  1960   GIST Surgery   ILEOCECETOMY  09/22/2007   OVARIAN CYST REMOVAL Right 1967   SMALL INTESTINE SURGERY  09/22/2007   TOTAL VAGINAL HYSTERECTOMY  1986   Fibroids    Allergies  Allergen Reactions   Lisinopril Cough   Penicillins Itching   Adhesive [Tape] Itching   Atorvastatin Itching   Dilaudid [Hydromorphone Hcl] Itching   Hydromorphone Itching    Allergies as of 06/17/2019      Reactions   Lisinopril Cough   Penicillins Itching   Adhesive [tape] Itching   Atorvastatin Itching   Dilaudid [hydromorphone Hcl] Itching   Hydromorphone Itching      Medication List       Accurate as of June 17, 2019  1:54 PM. If you have any questions, ask your nurse or doctor.        acetaminophen 500 MG tablet Commonly known as: TYLENOL Take 500 mg by mouth every 6 (six) hours as needed.   ALPRAZolam 0.5 MG tablet Commonly known as: XANAXDuanne Moron  Take 0.5 mg by mouth 2 (two) times daily as needed for anxiety.   aspirin EC 81 MG tablet Take 81 mg by mouth daily.   budesonide-formoterol 80-4.5 MCG/ACT inhaler Commonly known as: SYMBICORT Inhale 2 puffs into the lungs 2 (two) times daily.   cholecalciferol 25 MCG (1000 UNIT) tablet Commonly known as: VITAMIN D Take 1,000 Units by mouth daily.   dorzolamide 2 % ophthalmic solution Commonly known as: TRUSOPT Place 1 drop into the left eye 2 (two) times daily.   fexofenadine 180 MG tablet Commonly known as: ALLEGRA Take 180 mg by mouth daily.   latanoprost 0.005 % ophthalmic  solution Commonly known as: XALATAN Place 1 drop into both eyes at bedtime.   lithium carbonate 300 MG capsule Take 300 mg by mouth 2 (two) times daily between meals.   losartan 50 MG tablet Commonly known as: COZAAR Take 50 mg by mouth daily.   METAMUCIL FIBER PO Take by mouth. 1 packet daily   multivitamin tablet Take 1 tablet by mouth daily.   omeprazole 20 MG capsule Commonly known as: PRILOSEC Take 20 mg by mouth 2 (two) times daily.   ondansetron 4 MG tablet Commonly known as: ZOFRAN Take 4 mg by mouth every 6 (six) hours as needed for nausea or vomiting.   polyethylene glycol powder 17 GM/SCOOP powder Commonly known as: MiraLax Take 8.5-34 g by mouth 2 (two) times daily as needed for moderate constipation or severe constipation. To correct constipation.  Adjust dose over 1-2 months.  Goal = ~1 bowel movement / day What changed:   how much to take  when to take this  additional instructions   rOPINIRole 0.5 MG tablet Commonly known as: REQUIP Take 1 tablet (0.5 mg total) by mouth in the morning and at bedtime.   simvastatin 40 MG tablet Commonly known as: ZOCOR Take 40 mg by mouth at bedtime.   tiZANidine 2 MG tablet Commonly known as: ZANAFLEX Take 2 tablets by mouth at bedtime as needed.       Review of Systems:  Review of Systems  Constitutional: Negative for appetite change, fever and unexpected weight change.  HENT: Positive for hearing loss. Negative for congestion and trouble swallowing.   Eyes: Negative for visual disturbance.       Glaucoma, diplopia R+L lateral peripheral visual fields only.   Respiratory: Positive for cough. Negative for shortness of breath.        Occasionally DOE   Cardiovascular: Negative for leg swelling.  Gastrointestinal: Negative for abdominal pain, constipation, diarrhea, nausea and vomiting.  Genitourinary: Negative for difficulty urinating, dysuria, frequency and urgency.  Musculoskeletal: Positive for  arthralgias and gait problem.       Walker. R knee pain is chronic.   Skin: Negative for color change.  Neurological: Positive for tremors. Negative for dizziness, speech difficulty and headaches.       Tremors in hands/fingers at rest. L>R  Psychiatric/Behavioral: Negative for behavioral problems and sleep disturbance. The patient is not nervous/anxious.     Health Maintenance  Topic Date Due   DEXA SCAN  Never done   INFLUENZA VACCINE  08/15/2019   TETANUS/TDAP  08/19/2021   COVID-19 Vaccine  Completed   PNA vac Low Risk Adult  Completed    Physical Exam: Vitals:   06/17/19 1338  BP: 130/78  Pulse: (!) 56  Resp: 16  Temp: 97.8 F (36.6 C)  SpO2: 97%  Weight: 152 lb (68.9 kg)   Body mass index is 27.8  kg/m. Physical Exam Vitals and nursing note reviewed.  Constitutional:      Appearance: Normal appearance.  HENT:     Head: Normocephalic and atraumatic.     Mouth/Throat:     Mouth: Mucous membranes are moist.  Eyes:     Extraocular Movements: Extraocular movements intact.     Conjunctiva/sclera: Conjunctivae normal.     Pupils: Pupils are equal, round, and reactive to light.  Cardiovascular:     Rate and Rhythm: Regular rhythm. Bradycardia present.     Heart sounds: No murmur.     Comments: HR 50s Pulmonary:     Breath sounds: No rales.  Abdominal:     General: Bowel sounds are normal.     Palpations: Abdomen is soft.     Tenderness: There is no abdominal tenderness.  Musculoskeletal:        General: No swelling, tenderness, deformity or signs of injury.     Cervical back: Normal range of motion and neck supple.     Right lower leg: No edema.     Left lower leg: No edema.     Comments: Chronic R knee pain  Skin:    General: Skin is warm and dry.  Neurological:     General: No focal deficit present.     Mental Status: She is alert and oriented to person, place, and time. Mental status is at baseline.     Motor: No weakness.     Coordination:  Coordination abnormal.     Gait: Gait abnormal.     Comments: Resting tremor in fingers, improved.   Psychiatric:        Mood and Affect: Mood normal.        Behavior: Behavior normal.        Thought Content: Thought content normal.        Judgment: Judgment normal.     Labs reviewed: Basic Metabolic Panel: Recent Labs    04/05/19 0905 04/06/19 1245 05/28/19 0000  NA 140 139 142  K 4.3 4.4 4.6  CL 109 109 102  CO2 23 22 25*  GLUCOSE 127* 103*  --   BUN 16 17 26*  CREATININE 0.91 0.82 1.0  CALCIUM 11.4* 10.8* 11.2*  TSH 2.94  --   --    Liver Function Tests: Recent Labs    04/05/19 0905 04/06/19 1245 05/28/19 0000  AST _0 ALT _1 ALKPHOS  --  75 101  BILITOT 0.4 0.5  --   PROT 6.9 6.7  --   ALBUMIN  --  4.0 4.6   No results for input(s): LIPASE, AMYLASE in the last 8760 hours. No results for input(s): AMMONIA in the last 8760 hours. CBC: Recent Labs    04/05/19 0905 04/06/19 1245 05/28/19 0000  WBC 8.2 8.1 8.7  NEUTROABS 5,666 6.0 7,439  HGB 12.9 12.4 14.1  HCT 37.4 39.0 41  MCV 97.9 105.4*  --   PLT 297 268 336   Lipid Panel: Recent Labs    04/05/19 0905  CHOL 284*  HDL 73  LDLCALC 173*  TRIG 225*  CHOLHDL 3.9   No results found for: HGBA1C  Procedures since last visit: No results found.  Assessment/Plan  Right knee pain Chronic, COVID pulsed pursuing surgery, desires referral to Ortho.   Bradycardia Asymptomatic, improved to 50s from 40s after Tylenol PM stopped.   Bipolar I disorder, single manic episode, in full remission Stable, continue Lithium, last lithium level was normal.  Anxiety state Continue Alprazolam, effective.   Parkinsonism (HCC) Fine tremor in fingers, R+L lateral peripheral vision shows diplopia, no change. Continue Requip.   Slow transit constipation Stable, continue MiraLax, Metamucil.   GERD (gastroesophageal reflux disease) Stable, continue Omeprazole.   Essential (primary)  hypertension Controlled blood pressure, continue Losartan  Cough variant asthma Stable, underwent lung function test, continue Symbicort.   Hypercalcemia Ca normalized per Dr. Lyndel Safe note, PTH unable to locate    Labs/tests ordered:  none  Next appt:  08/05/2019

## 2019-06-17 NOTE — Assessment & Plan Note (Signed)
Fine tremor in fingers, R+L lateral peripheral vision shows diplopia, no change. Continue Requip.

## 2019-06-17 NOTE — Assessment & Plan Note (Signed)
Stable, continue MiraLax, Metamucil.

## 2019-06-17 NOTE — Assessment & Plan Note (Signed)
Continue Alprazolam, effective.

## 2019-06-17 NOTE — Assessment & Plan Note (Signed)
Stable, continue Lithium, last lithium level was normal.

## 2019-06-17 NOTE — Assessment & Plan Note (Signed)
Ca normalized per Dr. Lyndel Safe note, PTH unable to locate

## 2019-06-17 NOTE — Assessment & Plan Note (Signed)
Chronic, COVID pulsed pursuing surgery, desires referral to Ortho.

## 2019-06-28 ENCOUNTER — Other Ambulatory Visit: Payer: Self-pay | Admitting: Nurse Practitioner

## 2019-07-01 ENCOUNTER — Other Ambulatory Visit: Payer: Self-pay

## 2019-07-01 ENCOUNTER — Encounter: Payer: Self-pay | Admitting: Nurse Practitioner

## 2019-07-01 ENCOUNTER — Non-Acute Institutional Stay: Payer: Medicare PPO | Admitting: Nurse Practitioner

## 2019-07-01 DIAGNOSIS — G20 Parkinson's disease: Secondary | ICD-10-CM | POA: Diagnosis not present

## 2019-07-01 DIAGNOSIS — K219 Gastro-esophageal reflux disease without esophagitis: Secondary | ICD-10-CM | POA: Diagnosis not present

## 2019-07-01 DIAGNOSIS — F411 Generalized anxiety disorder: Secondary | ICD-10-CM

## 2019-07-01 DIAGNOSIS — R001 Bradycardia, unspecified: Secondary | ICD-10-CM

## 2019-07-01 DIAGNOSIS — F304 Manic episode in full remission: Secondary | ICD-10-CM | POA: Diagnosis not present

## 2019-07-01 DIAGNOSIS — G20C Parkinsonism, unspecified: Secondary | ICD-10-CM

## 2019-07-01 DIAGNOSIS — R609 Edema, unspecified: Secondary | ICD-10-CM

## 2019-07-01 DIAGNOSIS — J45991 Cough variant asthma: Secondary | ICD-10-CM

## 2019-07-01 DIAGNOSIS — I10 Essential (primary) hypertension: Secondary | ICD-10-CM | POA: Diagnosis not present

## 2019-07-01 MED ORDER — ROPINIROLE HCL 0.5 MG PO TABS: 0.5000 mg | ORAL_TABLET | Freq: Three times a day (TID) | ORAL | 2 refills | Status: DC

## 2019-07-01 NOTE — Assessment & Plan Note (Signed)
Stable, continue prn Alprazolam.  

## 2019-07-01 NOTE — Assessment & Plan Note (Addendum)
(  diplopia R+L lateral,  tremor in fingers), the patient desires to try higher dose of Requip, will increase  Requip 0.5mg  tid.

## 2019-07-01 NOTE — Assessment & Plan Note (Signed)
stable, continue Lithium 300mg  bid.

## 2019-07-01 NOTE — Assessment & Plan Note (Signed)
stable, on Symbicort bid. Lung function test 2020

## 2019-07-01 NOTE — Assessment & Plan Note (Addendum)
Resolved since off Tylenol PM HR 68 bpm today

## 2019-07-01 NOTE — Assessment & Plan Note (Signed)
Ca normalized per Dr. Lyndel Safe note

## 2019-07-01 NOTE — Assessment & Plan Note (Signed)
stable, continue  Omeprazole 20mg  bid.

## 2019-07-01 NOTE — Assessment & Plan Note (Signed)
Weight gained about #6-7Ibs in the past month, true weight gain vs fluid/edema, the patent denied increased SOB, cough, or palpitation. ? AR of Requip, monitor weight and respiratory symptoms.

## 2019-07-01 NOTE — Progress Notes (Signed)
Location:   clinic [place]   Place of Service:  Clinic (12) Provider: Marlana Latus NP  Code Status: DNR Goals of Care: [place] 04/06/2019  Does Patient Have a Medical Advance Directive? Yes  Type of Advance Directive Living will;Healthcare Power of Attorney  Does patient want to make changes to medical advance directive? -  Would patient like information on creating a medical advance directive? -  Pre-existing out of facility DNR order (yellow form or pink MOST form) -     Chief Complaint  Patient presents with  . Acute Visit    Patient returns to the clinic to get her ropinerole.     HPI: Patient is a 79 y.o. female seen today for medical management of chronic diseases.    Hx of GIST of small intestine s/p resection.              constipation. Prn Metamucil, MiraLax are effective.  Hx of Parkinson's, stable(diplopia R+L lateral,  tremor in fingers), on Requip 0.5mg  bid. GERD, stable, on Omeprazole 20mg  bid. HTN, blood pressure is controlled on Losartan 50mg  qd. Bipolar disorder, stable, on Lithium 300mg  bid. COPD, stable, on Symbicort bid. Her mood is stable, on Alprazolam 0.5mg  bid prn. Bradycardia, improved from 50s from 40s,  normalized after Tylenol PM dc'd. Hypercalcemia, Ca normalized per Dr. Lyndel Safe note    Past Medical History:  Diagnosis Date  . Anxiety   . Cardiac conduction disorder 03/30/2012   Overview:  STORY: ETT 03/09/2012 Echo 03/07/2012 also normal Dr Karilyn Cota: ?sick sinus syndrome.  HR is in the 40's.  Increased fatigue over the last month.  BP is stable   . Diverticulosis of colon   . GIST (gastrointestinal stroma tumor), malignant, colon ([place])   . History of colon polyps 10/24/2008  . Hypertension   . Major neurocognitive disorder due to Parkinson's disease, possible ([place])    Tremors possible parkinsons  . Manic disorder, single episode, in full remission ([place]) 12/01/2009  . Sixth nerve palsy     Past Surgical History:   Procedure Laterality Date  . BILATERAL SALPINGOOPHORECTOMY  09/22/2007  . Gastrointestinal Stroma Tumor,  Other  1960   GIST Surgery  . ILEOCECETOMY  09/22/2007  . OVARIAN CYST REMOVAL Right 1967  . SMALL INTESTINE SURGERY  09/22/2007  . TOTAL VAGINAL HYSTERECTOMY  1986   Fibroids    Allergies  Allergen Reactions  . Lisinopril Cough  . Penicillins Itching  . Adhesive [Tape] Itching  . Atorvastatin Itching  . Dilaudid [Hydromorphone Hcl] Itching  . Hydromorphone Itching    Allergies as of 07/01/2019      Reactions   Lisinopril Cough   Penicillins Itching   Adhesive [tape] Itching   Atorvastatin Itching   Dilaudid [hydromorphone Hcl] Itching   Hydromorphone Itching      Medication List       Accurate as of July 01, 2019  5:10 PM. If you have any questions, ask your nurse or doctor.        acetaminophen 500 MG tablet Commonly known as: TYLENOL Take 500 mg by mouth every 6 (six) hours as needed.   ALPRAZolam 0.5 MG tablet Commonly known as: XANAX TAKE (1) TABLET TWICE DAILY AS NEEDED FOR ANXIETY.   aspirin EC 81 MG tablet Take 81 mg by mouth daily.   budesonide-formoterol 80-4.5 MCG/ACT inhaler Commonly known as: SYMBICORT Inhale 2 puffs into the lungs 2 (two) times daily.   cholecalciferol 25 MCG (1000 UNIT) tablet Commonly known as: VITAMIN  D Take 1,000 Units by mouth daily.   dorzolamide 2 % ophthalmic solution Commonly known as: TRUSOPT Place 1 drop into the left eye 2 (two) times daily.   fexofenadine 180 MG tablet Commonly known as: ALLEGRA Take 180 mg by mouth daily.   latanoprost 0.005 % ophthalmic solution Commonly known as: XALATAN Place 1 drop into both eyes at bedtime.   lithium carbonate 300 MG capsule Take 300 mg by mouth 2 (two) times daily between meals.   losartan 50 MG tablet Commonly known as: COZAAR Take 50 mg by mouth daily.   METAMUCIL FIBER PO Take by mouth. 1 packet daily   multivitamin tablet Take 1 tablet by mouth  daily.   omeprazole 20 MG capsule Commonly known as: PRILOSEC Take 20 mg by mouth 2 (two) times daily.   ondansetron 4 MG tablet Commonly known as: ZOFRAN Take 4 mg by mouth every 6 (six) hours as needed for nausea or vomiting.   polyethylene glycol powder 17 GM/SCOOP powder Commonly known as: MiraLax Take 8.5-34 g by mouth 2 (two) times daily as needed for moderate constipation or severe constipation. To correct constipation.  Adjust dose over 1-2 months.  Goal = ~1 bowel movement / day What changed:   how much to take  when to take this  additional instructions   rOPINIRole 0.5 MG tablet Commonly known as: Requip Take 1 tablet (0.5 mg total) by mouth 3 (three) times daily. Dispense when the patient's request it What changed:   when to take this  additional instructions Changed by: Kellin Bartling X Karenna Romanoff, NP   simvastatin 40 MG tablet Commonly known as: ZOCOR Take 40 mg by mouth at bedtime.   tiZANidine 2 MG tablet Commonly known as: ZANAFLEX Take 2 tablets by mouth at bedtime as needed.       Review of Systems:  Review of Systems  Constitutional: Positive for unexpected weight change. Negative for appetite change and fever.       #7 Ibs weight gained, true vs fluid  HENT: Positive for hearing loss. Negative for congestion and trouble swallowing.   Eyes: Negative for visual disturbance.       Glaucoma, diplopia R+L lateral peripheral visual fields only.   Respiratory: Positive for cough. Negative for shortness of breath.        Occasionally DOE   Cardiovascular: Positive for leg swelling.  Gastrointestinal: Negative for abdominal pain and constipation.  Genitourinary: Negative for dysuria, frequency and urgency.  Musculoskeletal: Positive for arthralgias and gait problem.       Walker. R knee pain is chronic, pending Ortho  Skin: Negative for color change.  Neurological: Positive for tremors. Negative for speech difficulty and light-headedness.       Tremors in  hands/fingers at rest. L>R, progressing  Psychiatric/Behavioral: Negative for behavioral problems and sleep disturbance. The patient is not nervous/anxious.     Health Maintenance  Topic Date Due  . Hepatitis C Screening  Never done  . DEXA SCAN  Never done  . INFLUENZA VACCINE  08/15/2019  . TETANUS/TDAP  08/19/2021  . COVID-19 Vaccine  Completed  . PNA vac Low Risk Adult  Completed    Physical Exam: Vitals:   07/01/19 1450  BP: (!) 144/82  Pulse: 68  Temp: 97.7 F (36.5 C)  SpO2: 98%  Weight: 148 lb 9.6 oz (67.4 kg)  Height: 5\' 2"  (1.575 m)   Body mass index is 27.18 kg/m. Physical Exam Vitals and nursing note reviewed.  Constitutional:  Appearance: Normal appearance.  HENT:     Head: Normocephalic and atraumatic.     Mouth/Throat:     Mouth: Mucous membranes are moist.  Eyes:     Extraocular Movements: Extraocular movements intact.     Conjunctiva/sclera: Conjunctivae normal.     Pupils: Pupils are equal, round, and reactive to light.  Cardiovascular:     Rate and Rhythm: Normal rate and regular rhythm.     Heart sounds: No murmur heard.   Pulmonary:     Breath sounds: No rales.  Abdominal:     General: Bowel sounds are normal.     Palpations: Abdomen is soft.     Tenderness: There is no abdominal tenderness.  Musculoskeletal:     Cervical back: Normal range of motion and neck supple.     Right lower leg: Edema present.     Left lower leg: Edema present.     Comments: Chronic R knee pain. Trace edema BLE  Skin:    General: Skin is warm and dry.  Neurological:     General: No focal deficit present.     Mental Status: She is alert and oriented to person, place, and time. Mental status is at baseline.     Motor: No weakness.     Coordination: Coordination abnormal.     Gait: Gait abnormal.     Comments: Resting tremor in fingers, improved.   Psychiatric:        Mood and Affect: Mood normal.        Behavior: Behavior normal.        Thought Content:  Thought content normal.        Judgment: Judgment normal.     Labs reviewed: Basic Metabolic Panel: Recent Labs    04/05/19 0905 04/06/19 1245 05/28/19 0000  NA 140 139 142  K 4.3 4.4 4.6  CL 109 109 102  CO2 23 22 25*  GLUCOSE 127* 103*  --   BUN 16 17 26*  CREATININE 0.91 0.82 1.0  CALCIUM 11.4* 10.8* 11.2*  TSH 2.94  --   --    Liver Function Tests: Recent Labs    04/05/19 0905 04/06/19 1245 05/28/19 0000  AST 19 23 22   ALT 13 18 13   ALKPHOS  --  75 101  BILITOT 0.4 0.5  --   PROT 6.9 6.7  --   ALBUMIN  --  4.0 4.6   No results for input(s): LIPASE, AMYLASE in the last 8760 hours. No results for input(s): AMMONIA in the last 8760 hours. CBC: Recent Labs    04/05/19 0905 04/06/19 1245 05/28/19 0000  WBC 8.2 8.1 8.7  NEUTROABS 5,666 6.0 7,439  HGB 12.9 12.4 14.1  HCT 37.4 39.0 41  MCV 97.9 105.4*  --   PLT 297 268 336   Lipid Panel: Recent Labs    04/05/19 0905  CHOL 284*  HDL 73  LDLCALC 173*  TRIG 225*  CHOLHDL 3.9   No results found for: HGBA1C  Procedures since last visit: No results found.  Assessment/Plan  Parkinsonism (HCC) (diplopia R+L lateral,  tremor in fingers), the patient desires to try higher dose of Requip, will increase  Requip 0.5mg  tid.   GERD (gastroesophageal reflux disease) stable, continue  Omeprazole 20mg  bid.   Essential (primary) hypertension blood pressure is controlled, continue Losartan 50mg  qd.  Bipolar I disorder, single manic episode, in full remission stable, continue Lithium 300mg  bid.  Cough variant asthma stable, on Symbicort bid. Lung function test 2020  Anxiety state Stable, continue prn Alprazolam.   Bradycardia Resolved since off Tylenol PM HR 68 bpm today  Hypercalcemia Ca normalized per Dr. Lyndel Safe note   Edema Weight gained about #6-7Ibs in the past month, true weight gain vs fluid/edema, the patent denied increased SOB, cough, or palpitation. ? AR of Requip, monitor weight and  respiratory symptoms.     Labs/tests ordered:  None  Next appt:  08/05/2019

## 2019-07-01 NOTE — Assessment & Plan Note (Signed)
blood pressure is controlled, continue Losartan 50mg  qd.

## 2019-07-15 ENCOUNTER — Ambulatory Visit: Payer: Medicare PPO | Admitting: Orthopedic Surgery

## 2019-07-16 ENCOUNTER — Ambulatory Visit (INDEPENDENT_AMBULATORY_CARE_PROVIDER_SITE_OTHER): Payer: Medicare PPO

## 2019-07-16 ENCOUNTER — Ambulatory Visit: Payer: Medicare PPO | Admitting: Orthopedic Surgery

## 2019-07-16 ENCOUNTER — Encounter: Payer: Self-pay | Admitting: Orthopedic Surgery

## 2019-07-16 VITALS — Ht 62.0 in | Wt 140.0 lb

## 2019-07-16 DIAGNOSIS — M25561 Pain in right knee: Secondary | ICD-10-CM

## 2019-07-16 DIAGNOSIS — G8929 Other chronic pain: Secondary | ICD-10-CM

## 2019-07-17 ENCOUNTER — Encounter: Payer: Self-pay | Admitting: Orthopedic Surgery

## 2019-07-17 NOTE — Progress Notes (Signed)
Office Visit Note   Patient: Tracey [name]           Date of Birth: 04/10/1940           MRN: [id number] Visit Date: 07/16/2019 Requested by: Mast, Man X, NP [address],  [place] [id number] PCP: [name], MD  Subjective: Chief Complaint  Patient presents with  . Right Knee - Pain    HPI: Tracey [name] is a 79 year old patient who [facility]  She reports fairly debilitating right knee pain which is preventing her from being able to ambulate independently.  She few other medical comorbidities.  She is able to ambulate and has pretty reasonable quad hamstring and hip flexion strength.  She has end-stage tricompartmental arthritis with varus alignment and significant medial compartment arthritis.  Does use a walker but also has to have the assist of one of her sons or assistance in order to [facility].  Does not report much rest pain but she does have pain with ambulation daily.  She does have a recent diagnosis of Parkinson's.  Left knee has no difficulties.  She had cortisone injection 2 weeks ago which she states did not give her much help.  She is here with her son today.  She states that the pain is debilitating and that it is something that she cannot and does not want to live with.  She has considered surgical and nonsurgical options and is requesting knee replacement to assist her in her ambulatory ability.              ROS: All systems reviewed are negative as they relate to the chief complaint within the history of present illness.  Patient denies  fevers or chills.   Assessment & Plan: Visit Diagnoses:  1. Chronic pain of right knee     Plan: Impression is right knee arthritis in a patient with Parkinson's but pretty functional quad hamstring and hip flexion strength.  She does have significant arthritis on radiographic examination.  Extensor mechanism is intact.  Cortisone has been not helpful with  most recent injection 2 weeks ago.  Discussed with Zettie and her son the risk and benefits of total knee replacement.  Discussed how it would be very important for Tracey [name] to be very committed to the extensive rehabilitative process required to achieve a functional range of motion and ambulatory ability.  Patient did vocalize high level of motivation to put in the necessary work required to achieve a good result.  Also the risk of surgery are discussed including infection nerve vessel damage heart attack stroke deep vein thrombosis as well as potential for diminished ambulatory ability and strength.  Despite these risk and despite her relatively advanced age she does want to proceed with knee replacement.  I do want to wait about 6 weeks to get 2 months between the knee replacement and cortisone injection.  Patient understands the risk and benefits.  All questions answered.  Follow-Up Instructions: No follow-ups on file.   Orders:  Orders Placed This Encounter  Procedures  . XR KNEE 3 VIEW RIGHT   No orders of the defined types were placed in this encounter.     Procedures: No procedures performed   Clinical Data: No additional findings.  Objective: Vital Signs: Ht 5\' 2"  (1.575 m)   Wt 140 lb (63.5 kg)   BMI 25.61 kg/m   Physical Exam:   Constitutional: Patient  appears well-developed HEENT:  Head: Normocephalic Eyes:EOM are normal Neck: Normal range of motion Cardiovascular: Normal rate Pulmonary/chest: Effort normal Neurologic: Patient is alert Skin: Skin is warm Psychiatric: Patient has normal mood and affect    Ortho Exam: Ortho exam demonstrates varus alignment right knee with palpable pedal pulse.  Extensor mechanism is intact.  Range of motion is about 10 degree flexion contracture to 100 degrees of flexion.  She has pretty reasonable quad strength.  Watched her ambulate and she is able to ambulate with a cane.  She does not have much rigidity in her muscles with  passive range of motion.  No cogwheeling.  Skin is intact in the right knee region.  No groin pain with internal X rotation of the right leg.  Specialty Comments:  No specialty comments available.  Imaging: No results found.   PMFS History: Patient Active Problem List   Diagnosis Date Noted  . Edema 07/01/2019  . Right knee pain 06/17/2019  . Hypercalcemia 06/17/2019  . Slow transit constipation 05/31/2019  . Bradycardia 05/31/2019  . History of shingles 04/22/2019  . Upper back pain on right side 04/01/2019  . DOE (dyspnea on exertion) 03/25/2018  . Cough variant asthma 03/24/2018  . Parkinsonism ([place]) 11/15/2015  . Diplopia 10/16/2015  . SBO (small bowel obstruction) ([place]) 09/18/2013  . Glaucoma 09/18/2013  . GERD (gastroesophageal reflux disease) 09/18/2013  . HLD (hyperlipidemia) 02/21/2013  . Essential (primary) hypertension 02/21/2013  . Barrett esophagus 02/19/2013  . Cardiac conduction disorder 03/30/2012  . Difficulty hearing 08/20/2011  . 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 Allergic rhinitis 09/14/2010  . Anxiety state 12/22/2009  . Bipolar I disorder, single manic episode, in full remission ([place]) 12/01/2009  . 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 History of colon polyps 10/24/2008  . Arthritis, degenerative 10/24/2008   Past Medical History:  Diagnosis Date  . Anxiety   . Cardiac conduction disorder 03/30/2012   Overview:  STORY: ETT 03/09/2012 Echo 03/07/2012 also normal Dr Karilyn Cota: ?sick sinus syndrome.  HR is in the 40's.  Increased fatigue over the last month.  BP is stable   . Diverticulosis of colon   . GIST (gastrointestinal stroma tumor), malignant, colon ([place])   . History of colon polyps 10/24/2008  . Hypertension   . Major neurocognitive disorder due to Parkinson's disease, possible ([place])    Tremors possible parkinsons  . Manic disorder,  single episode, in full remission ([place]) 12/01/2009  . Sixth nerve palsy     Family History  Problem Relation Age of Onset  . Congestive Heart Failure Mother   . Dementia Mother   . Heart disease Father   . Diabetes Father   . Lung cancer Son     Past Surgical History:  Procedure Laterality Date  . BILATERAL SALPINGOOPHORECTOMY  09/22/2007  . Gastrointestinal Stroma Tumor,  Other  1960   GIST Surgery  . ILEOCECETOMY  09/22/2007  . OVARIAN CYST REMOVAL Right 1967  . SMALL INTESTINE SURGERY  09/22/2007  . TOTAL VAGINAL HYSTERECTOMY  1986   Fibroids   Social History   Occupational History  . Not on file  Tobacco Use  . Smoking status: Never Smoker  . Smokeless tobacco: Never Used  Vaping Use  . Vaping Use: Never used  Substance and Sexual Activity  . Alcohol use: No  . Drug use: No  . Sexual activity: Not Currently    Birth control/protection: Post-menopausal

## 2019-07-22 ENCOUNTER — Other Ambulatory Visit: Payer: Self-pay

## 2019-08-05 ENCOUNTER — Other Ambulatory Visit: Payer: Self-pay

## 2019-08-05 ENCOUNTER — Non-Acute Institutional Stay: Payer: Medicare PPO | Admitting: Nurse Practitioner

## 2019-08-05 VITALS — BP 110/62 | HR 51 | Temp 96.4°F | Ht 62.0 in | Wt 150.0 lb

## 2019-08-05 DIAGNOSIS — R609 Edema, unspecified: Secondary | ICD-10-CM

## 2019-08-05 DIAGNOSIS — K219 Gastro-esophageal reflux disease without esophagitis: Secondary | ICD-10-CM | POA: Diagnosis not present

## 2019-08-05 DIAGNOSIS — G20 Parkinson's disease: Secondary | ICD-10-CM

## 2019-08-05 DIAGNOSIS — I10 Essential (primary) hypertension: Secondary | ICD-10-CM | POA: Diagnosis not present

## 2019-08-05 DIAGNOSIS — E785 Hyperlipidemia, unspecified: Secondary | ICD-10-CM | POA: Diagnosis not present

## 2019-08-05 DIAGNOSIS — C49A3 Gastrointestinal stromal tumor of small intestine: Secondary | ICD-10-CM | POA: Diagnosis not present

## 2019-08-05 DIAGNOSIS — K5901 Slow transit constipation: Secondary | ICD-10-CM

## 2019-08-05 DIAGNOSIS — J45991 Cough variant asthma: Secondary | ICD-10-CM

## 2019-08-05 DIAGNOSIS — F304 Manic episode in full remission: Secondary | ICD-10-CM

## 2019-08-05 DIAGNOSIS — R001 Bradycardia, unspecified: Secondary | ICD-10-CM

## 2019-08-05 DIAGNOSIS — G20C Parkinsonism, unspecified: Secondary | ICD-10-CM

## 2019-08-05 MED ORDER — ROPINIROLE HCL 0.5 MG PO TABS: 0.5000 mg | ORAL_TABLET | Freq: Three times a day (TID) | ORAL | 2 refills | Status: DC

## 2019-08-05 MED ORDER — ALPRAZOLAM 0.5 MG PO TBDP: 0.5000 mg | ORAL_TABLET | Freq: Two times a day (BID) | ORAL | 1 refills | Status: DC | PRN

## 2019-08-05 NOTE — Assessment & Plan Note (Addendum)
stable, continue Lithium 300mg  bid, Alprazolam 0.5MG  BID prn

## 2019-08-05 NOTE — Assessment & Plan Note (Signed)
Hx of GIST of small intestine s/p resection. prn MiraLax. Daily Psyllium . 

## 2019-08-05 NOTE — Assessment & Plan Note (Signed)
Stable, continue Omeprazole, Zofran prn

## 2019-08-05 NOTE — Assessment & Plan Note (Signed)
Prn Metamucil, MiraLax are effective.

## 2019-08-05 NOTE — Assessment & Plan Note (Signed)
Lung function test 2020, continue Symbicort.

## 2019-08-05 NOTE — Assessment & Plan Note (Signed)
Blood pressure is controlled, continue Losartan

## 2019-08-05 NOTE — Assessment & Plan Note (Signed)
Resolved, Tylenol pm was the culprit.

## 2019-08-05 NOTE — Assessment & Plan Note (Signed)
Update lipid panel prior to the next appointment.

## 2019-08-05 NOTE — Progress Notes (Signed)
Location:   clinic [place] Room Number: 1950 Place of Service:  Clinic (12) Provider: Marlana Latus NP  Code Status: DNR Goals of Care: [place] 04/06/2019  Does Patient Have a Medical Advance Directive? Yes  Type of Advance Directive Living will;Healthcare Power of Attorney  Does patient want to make changes to medical advance directive? -  Would patient like information on creating a medical advance directive? -  Pre-existing out of facility DNR order (yellow form or pink MOST form) -     Chief Complaint  Patient presents with  . Medical Management of Chronic Issues    Patient seen for routine followup, Is scheduled for knee replacement 09/14/19.   Marland Kitchen Best Practice Recommendations    No documentation of hepatitis C screening  . Advanced Directive    States she has an Scientist, physiological but there is no documentation, other than a durable POA    HPI: Patient is a 79 y.o. female seen today for medical management of chronic diseases.    Hx of GIST of small intestine s/p resection.prn MiraLax. Daily Psyllium . constipation. Prn Metamucil, MiraLax are effective. Hx of Parkinson's, stable(diplopiaR+L lateral,tremor in fingers), on Requip 0.'5mg'$  tid. Zannaflex '2mg'$  hs prn.   GERD, stable, on Omeprazole '20mg'$  bid, prn Ondansetron '4mg'$  q6hr.  HTN, blood pressure is controlled on Losartan '50mg'$  qd.    Bipolar disorder, stable, on Lithium '300mg'$  bid, Alprazolam 0.'5MG'$  BID prn  COPD, stable, on Symbicort bid.  Her mood is stable, on Alprazolam 0.'5mg'$  bid prn.  Bradycardia, improved from 50s from 40s,normalized after Tylenol PM dc'd.   Hypercalcemia, Ca normalized per Dr. Lyndel Safe note  Past Medical History:  Diagnosis Date  . Anxiety   . Cardiac conduction disorder 03/30/2012   Overview:  STORY: ETT 03/09/2012 Echo 03/07/2012 also normal Dr Karilyn Cota: ?sick sinus syndrome.  HR is in the 40's.  Increased fatigue over the last month.  BP  is stable   . Diverticulosis of colon   . GIST (gastrointestinal stroma tumor), malignant, colon ([place])   . History of colon polyps 10/24/2008  . Hypertension   . Major neurocognitive disorder due to Parkinson's disease, possible ([place])    Tremors possible parkinsons  . Manic disorder, single episode, in full remission ([place]) 12/01/2009  . Sixth nerve palsy     Past Surgical History:  Procedure Laterality Date  . BILATERAL SALPINGOOPHORECTOMY  09/22/2007  . Gastrointestinal Stroma Tumor,  Other  1960   GIST Surgery  . ILEOCECETOMY  09/22/2007  . OVARIAN CYST REMOVAL Right 1967  . SMALL INTESTINE SURGERY  09/22/2007  . TOTAL VAGINAL HYSTERECTOMY  1986   Fibroids    Allergies  Allergen Reactions  . Lisinopril Cough  . Penicillins Itching  . Adhesive [Tape] Itching  . Atorvastatin Itching  . Dilaudid [Hydromorphone Hcl] Itching  . Hydromorphone Itching    Allergies as of 08/05/2019      Reactions   Lisinopril Cough   Penicillins Itching   Adhesive [tape] Itching   Atorvastatin Itching   Dilaudid [hydromorphone Hcl] Itching   Hydromorphone Itching      Medication List       Accurate as of August 05, 2019 11:59 PM. If you have any questions, ask your nurse or doctor.        acetaminophen 500 MG tablet Commonly known as: TYLENOL Take 500 mg by mouth every 6 (six) hours as needed.   ALPRAZolam 0.5 MG dissolvable tablet Commonly known as: NIRAVAM Take 1 tablet (  0.5 mg total) by mouth 2 (two) times daily as needed for anxiety. Started by: Chelci [place] X Ricco Dershem, NP   aspirin EC 81 MG tablet Take 81 mg by mouth daily.   budesonide-formoterol 80-4.5 MCG/ACT inhaler Commonly known as: SYMBICORT Inhale 2 puffs into the lungs 2 (two) times daily.   cholecalciferol 25 MCG (1000 UNIT) tablet Commonly known as: VITAMIN D Take 1,000 Units by mouth daily.   dorzolamide 2 % ophthalmic solution Commonly known as: TRUSOPT Place 1 drop into the left eye 2 (two) times daily.   fexofenadine  180 MG tablet Commonly known as: ALLEGRA Take 180 mg by mouth daily.   latanoprost 0.005 % ophthalmic solution Commonly known as: XALATAN Place 1 drop into both eyes at bedtime.   lithium carbonate 300 MG capsule Take 300 mg by mouth 2 (two) times daily between meals.   losartan 50 MG tablet Commonly known as: COZAAR Take 50 mg by mouth daily.   METAMUCIL FIBER PO Take by mouth. 1 packet daily   multivitamin tablet Take 1 tablet by mouth daily.   omeprazole 20 MG capsule Commonly known as: PRILOSEC Take 20 mg by mouth 2 (two) times daily.   ondansetron 4 MG tablet Commonly known as: ZOFRAN Take 4 mg by mouth every 6 (six) hours as needed for nausea or vomiting.   polyethylene glycol powder 17 GM/SCOOP powder Commonly known as: MiraLax Take 8.5-34 g by mouth 2 (two) times daily as needed for moderate constipation or severe constipation. To correct constipation.  Adjust dose over 1-2 months.  Goal = ~1 bowel movement / day What changed:   how much to take  when to take this  additional instructions   rOPINIRole 0.5 MG tablet Commonly known as: Requip Take 1 tablet (0.5 mg total) by mouth 4 (four) times daily -  with meals and at bedtime. What changed:   when to take this  additional instructions Changed by: Tra Wilemon X Arda Daggs, NP   simvastatin 40 MG tablet Commonly known as: ZOCOR Take 40 mg by mouth at bedtime.   tiZANidine 2 MG tablet Commonly known as: ZANAFLEX Take 2 tablets by mouth at bedtime as needed.       Review of Systems:  Review of Systems  Constitutional: Positive for unexpected weight change. Negative for appetite change and fever.       No apparent increasing in swelling, no cough, SOB, sputum production, wears the same size of clothing, ? Accuracy of the scale, the patient's going to monitor her wt at ome.   HENT: Positive for hearing loss. Negative for congestion and trouble swallowing.   Eyes: Negative for visual disturbance.       Glaucoma,  diplopia R+L lateral peripheral visual fields only.   Respiratory: Positive for cough. Negative for shortness of breath.        Occasionally DOE   Cardiovascular: Positive for leg swelling.  Gastrointestinal: Negative for abdominal pain and constipation.  Genitourinary: Negative for dysuria, frequency and urgency.  Musculoskeletal: Positive for arthralgias and gait problem.       [place]. R knee pain is chronic, pending Ortho  Skin: Negative for color change.  Neurological: Positive for tremors. Negative for speech difficulty and light-headedness.       Tremors in hands/fingers at rest. L>R, progressing  Psychiatric/Behavioral: Negative for behavioral problems and sleep disturbance. The patient is not nervous/anxious.     Health Maintenance  Topic Date Due  . Hepatitis C Screening  Never done  . INFLUENZA VACCINE  08/15/2019  . TETANUS/TDAP  08/19/2021  . DEXA SCAN  Completed  . COVID-19 Vaccine  Completed  . PNA vac Low Risk Adult  Completed    Physical Exam: Vitals:   08/06/19 0952  BP: (!) 110/62  Pulse: 51  Temp: (!) 96.4 F (35.8 C)  TempSrc: Oral  SpO2: 97%  Weight: 150 lb (68 kg)  Height: '5\' 2"'$  (1.575 m)   Body mass index is 27.44 kg/m. Physical Exam Vitals and nursing note reviewed.  Constitutional:      Appearance: Normal appearance.  HENT:     Head: Normocephalic and atraumatic.     Mouth/Throat:     Mouth: Mucous membranes are moist.  Eyes:     Extraocular Movements: Extraocular movements intact.     Conjunctiva/sclera: Conjunctivae normal.     Pupils: Pupils are equal, round, and reactive to light.  Cardiovascular:     Rate and Rhythm: Normal rate and regular rhythm.     Heart sounds: No murmur heard.   Pulmonary:     Breath sounds: No rales.  Abdominal:     General: Bowel sounds are normal.     Palpations: Abdomen is soft.     Tenderness: There is no abdominal tenderness.  Musculoskeletal:        General: Tenderness present.     Cervical back:  Normal range of motion and neck supple.     Right lower leg: Edema present.     Left lower leg: Edema present.     Comments: Chronic R knee pain. Trace edema BLE. Pending total knee replacement.   Skin:    General: Skin is warm and dry.  Neurological:     General: No focal deficit present.     Mental Status: She is alert and oriented to person, place, and time. Mental status is at baseline.     Motor: No weakness.     Coordination: Coordination abnormal.     Gait: Gait abnormal.     Comments: Resting tremor in fingers, improved.   Psychiatric:        Mood and Affect: Mood normal.        Behavior: Behavior normal.        Thought Content: Thought content normal.        Judgment: Judgment normal.     Labs reviewed: Basic Metabolic Panel: Recent Labs    04/05/19 0905 04/06/19 1245 05/28/19 0000  NA 140 139 142  K 4.3 4.4 4.6  CL 109 109 102  CO2 23 22 25*  GLUCOSE 127* 103*  --   BUN 16 17 26*  CREATININE 0.91 0.82 1.0  CALCIUM 11.4* 10.8* 11.2*  TSH 2.94  --   --    Liver Function Tests: Recent Labs    04/05/19 0905 04/06/19 1245 05/28/19 0000  AST '19 23 22  '$ ALT '13 18 13  '$ ALKPHOS  --  75 101  BILITOT 0.4 0.5  --   PROT 6.9 6.7  --   ALBUMIN  --  4.0 4.6   No results for input(s): LIPASE, AMYLASE in the last 8760 hours. No results for input(s): AMMONIA in the last 8760 hours. CBC: Recent Labs    04/05/19 0905 04/06/19 1245 05/28/19 0000  WBC 8.2 8.1 8.7  NEUTROABS 5,666 6.0 7,439  HGB 12.9 12.4 14.1  HCT 37.4 39.0 41  MCV 97.9 105.4*  --   PLT 297 268 336   Lipid Panel: Recent Labs    04/05/19 0905  CHOL 284*  HDL 73  LDLCALC 173*  TRIG 225*  CHOLHDL 3.9   No results found for: HGBA1C  Procedures since last visit: XR KNEE 3 VIEW RIGHT  Result Date: 07/17/2019 AP lateral right knee reviewed.  End-stage tricompartmental arthritis is present worse in the medial side with varus alignment.  Bones osteopenic.  No acute  fracture.   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 Daily Psyllium .  Slow transit constipation  Prn Metamucil, MiraLax are effective.   Parkinsonism ([place]) stable(diplopiaR+L lateral,tremor in fingers), increase Requip 0.'5mg'$  qid. Zannaflex '2mg'$  hs prn.    Bipolar I disorder, single manic episode, in full remission  stable, continue Lithium '300mg'$  bid, Alprazolam 0.'5MG'$  BID prn   Bradycardia Resolved, Tylenol pm was the culprit.   Hypercalcemia Ca normalized per Dr. Lyndel Safe note. Update CMP/eGFR, Vit D, CBC/diff prior to the next appointment.    HLD (hyperlipidemia) Update lipid panel prior to the next appointment.   Cough variant asthma Lung function test 2020, continue Symbicort.   Edema No significant change.    Labs/tests ordered:  CBC/diff, CMP/eGFR, Vit D, lipid panel.   Next appt:  F/u Dr. Lyndel Safe 3-4 months

## 2019-08-05 NOTE — Patient Instructions (Signed)
CBC/diff, CMP/eGFR, Vit D, lipid panel.   Next appt:  F/u Dr. Lyndel Safe 3-4 months

## 2019-08-05 NOTE — Assessment & Plan Note (Addendum)
Ca normalized per Dr. Lyndel Safe note. Update CMP/eGFR, Vit D, CBC/diff prior to the next appointment.

## 2019-08-05 NOTE — Assessment & Plan Note (Signed)
No significant change.

## 2019-08-05 NOTE — Assessment & Plan Note (Addendum)
stable(diplopiaR+L lateral,tremor in fingers), increase Requip 0.5mg  qid. Zannaflex 2mg  hs prn.

## 2019-08-06 ENCOUNTER — Encounter: Payer: Self-pay | Admitting: Internal Medicine

## 2019-08-06 ENCOUNTER — Encounter: Payer: Self-pay | Admitting: Nurse Practitioner

## 2019-08-11 ENCOUNTER — Telehealth: Payer: Self-pay | Admitting: Orthopedic Surgery

## 2019-08-11 ENCOUNTER — Encounter: Payer: Self-pay | Admitting: Internal Medicine

## 2019-08-11 NOTE — Telephone Encounter (Signed)
What type of literature do you suggest I send patient?

## 2019-08-11 NOTE — Telephone Encounter (Signed)
mailed

## 2019-08-11 NOTE — Telephone Encounter (Signed)
Printed literature to mail to patient

## 2019-08-11 NOTE — Telephone Encounter (Signed)
Patient is scheduled for right total knee arthroplasty with Dr. Marlou Sa on 09-14-19.  She states she is very nervous about the surgery and would like to know what to expect.  She is requesting any literature pertaining to the knee replacement be sent to her home address.    [address]  [address]  [place] [address]

## 2019-08-31 ENCOUNTER — Other Ambulatory Visit: Payer: Self-pay | Admitting: Nurse Practitioner

## 2019-09-09 ENCOUNTER — Inpatient Hospital Stay (HOSPITAL_COMMUNITY): Admission: RE | Admit: 2019-09-09 | Payer: Medicare PPO | Source: Ambulatory Visit

## 2019-09-10 ENCOUNTER — Other Ambulatory Visit (HOSPITAL_COMMUNITY): Payer: Medicare PPO

## 2019-09-14 ENCOUNTER — Ambulatory Visit: Admit: 2019-09-14 | Payer: Medicare PPO | Admitting: Orthopedic Surgery

## 2019-09-14 SURGERY — ARTHROPLASTY, KNEE, TOTAL
Anesthesia: Spinal | Site: Knee | Laterality: Right

## 2019-09-29 ENCOUNTER — Inpatient Hospital Stay: Payer: Medicare PPO | Admitting: Orthopedic Surgery

## 2019-09-30 NOTE — Progress Notes (Signed)
[facility], [address] [place] [address] Phone: [phone] Fax: [phone]      Your procedure is scheduled on September 21  Report to [facility] Entrance "A" at 0530 A.M., and check in at the Admitting office.  Call this number if you have problems the morning of surgery:  [phone]  Call [phone] if you have any questions prior to your surgery date Monday-Friday 8am-4pm    Remember:  Do not eat after midnight the night before your surgery  You may drink clear liquids until 0530 am the morning of your surgery.   Clear liquids allowed are: Water, Non-Citrus Juices (without pulp), Carbonated Beverages, Clear Tea, Black Coffee Only, and Gatorade   Enhanced Recovery after Surgery for Orthopedics Enhanced Recovery after Surgery is a protocol used to improve the stress on your body and your recovery after surgery.  Patient Instructions  . The night before surgery:  o No food after midnight. ONLY clear liquids after midnight  .  Marland Kitchen The day of surgery (if you do NOT have diabetes):  o Drink ONE (1) Pre-Surgery Clear Ensure by _0430 am____ am the morning of surgery   o This drink was given to you during your hospital  pre-op appointment visit. o Nothing else to drink after completing the  Pre-Surgery Clear Ensure.          If you have questions, please contact your surgeon's office.     Take these medicines the morning of surgery with A SIP OF WATER  acetaminophen (TYLENOL) if needed Eye drops if needed lithium carbonate omeprazole (PRILOSEC) rOPINIRole (REQUIP  Follow your surgeon's instructions on when to stop Aspirin.  If no instructions were given by your surgeon then you will need to call the office to get those instructions.    As of today, STOP taking any Aspirin (unless otherwise instructed by your surgeon) Aleve, Naproxen, Ibuprofen, Motrin, Advil, Goody's, BC's, all herbal  medications, fish oil, and all vitamins.                      Do not wear jewelry, make up, or nail polish            Do not wear lotions, powders, perfumes/colognes, or deodorant.            Do not shave 48 hours prior to surgery.              Do not bring valuables to the hospital.            [facility] is not responsible for any belongings or valuables.  Do NOT Smoke (Tobacco/Vaping) or drink Alcohol 24 hours prior to your procedure If you use a CPAP at night, you may bring all equipment for your overnight stay.   Contacts, glasses, dentures or bridgework may not be worn into surgery.      For patients admitted to the hospital, discharge time will be determined by your treatment team.   Patients discharged the day of surgery will not be allowed to drive home, and someone needs to stay with them for 24 hours.    Special instructions:   [facility]- Preparing For Surgery  Before surgery, you can play an important role. Because skin is not sterile, your skin needs to be as free of germs as possible. You can reduce the number of germs on your skin by washing with CHG (chlorahexidine gluconate) Soap  before surgery.  CHG is an antiseptic cleaner which kills germs and bonds with the skin to continue killing germs even after washing.    Oral Hygiene is also important to reduce your risk of infection.  Remember - BRUSH YOUR TEETH THE MORNING OF SURGERY WITH YOUR REGULAR TOOTHPASTE  Please do not use if you have an allergy to CHG or antibacterial soaps. If your skin becomes reddened/irritated stop using the CHG.  Do not shave (including legs and underarms) for at least 48 hours prior to first CHG shower. It is OK to shave your face.  Please follow these instructions carefully.   1. Shower the NIGHT BEFORE SURGERY and the MORNING OF SURGERY with CHG Soap.   2. If you chose to wash your hair, wash your hair first as usual with your normal shampoo.  3. After you shampoo, rinse your hair  and body thoroughly to remove the shampoo.  4. Use CHG as you would any other liquid soap. You can apply CHG directly to the skin and wash gently with a scrungie or a clean washcloth.   5. Apply the CHG Soap to your body ONLY FROM THE NECK DOWN.  Do not use on open wounds or open sores. Avoid contact with your eyes, ears, mouth and genitals (private parts). Wash Face and genitals (private parts)  with your normal soap.   6. Wash thoroughly, paying special attention to the area where your surgery will be performed.  7. Thoroughly rinse your body with warm water from the neck down.  8. DO NOT shower/wash with your normal soap after using and rinsing off the CHG Soap.  9. Pat yourself dry with a CLEAN TOWEL.  10. Wear CLEAN PAJAMAS to bed the night before surgery  11. Place CLEAN SHEETS on your bed the night of your first shower and DO NOT SLEEP WITH PETS.   Day of Surgery: Wear Clean/Comfortable clothing the morning of surgery Do not apply any deodorants/lotions.   Remember to brush your teeth WITH YOUR REGULAR TOOTHPASTE.   Please read over the following fact sheets that you were given.

## 2019-10-01 ENCOUNTER — Other Ambulatory Visit: Payer: Self-pay

## 2019-10-01 ENCOUNTER — Other Ambulatory Visit (HOSPITAL_COMMUNITY)
Admission: RE | Admit: 2019-10-01 | Discharge: 2019-10-01 | Disposition: A | Discharge status: Home / Self Care | Payer: Medicare PPO | Source: Ambulatory Visit | Attending: Orthopedic Surgery | Admitting: Orthopedic Surgery

## 2019-10-01 ENCOUNTER — Encounter (HOSPITAL_COMMUNITY): Payer: Self-pay

## 2019-10-01 ENCOUNTER — Telehealth: Payer: Self-pay | Admitting: Orthopedic Surgery

## 2019-10-01 ENCOUNTER — Encounter (HOSPITAL_COMMUNITY)
Admission: RE | Admit: 2019-10-01 | Discharge: 2019-10-01 | Disposition: A | Discharge status: Home / Self Care | Payer: Medicare PPO | Source: Ambulatory Visit | Attending: Orthopedic Surgery | Admitting: Orthopedic Surgery

## 2019-10-01 DIAGNOSIS — Z20822 Contact with and (suspected) exposure to covid-19: Secondary | ICD-10-CM | POA: Diagnosis not present

## 2019-10-01 DIAGNOSIS — G20 Parkinson's disease: Secondary | ICD-10-CM | POA: Insufficient documentation

## 2019-10-01 DIAGNOSIS — Z01812 Encounter for preprocedural laboratory examination: Secondary | ICD-10-CM | POA: Insufficient documentation

## 2019-10-01 HISTORY — DX: Unspecified osteoarthritis, unspecified site: M19.90

## 2019-10-01 HISTORY — DX: Cardiac murmur, unspecified: R01.1

## 2019-10-01 LAB — BASIC METABOLIC PANEL
Anion gap: 7 (ref 5–15)
BUN: 20 mg/dL (ref 8–23)
CO2: 24 mmol/L (ref 22–32)
Calcium: 10.9 mg/dL — ABNORMAL HIGH (ref 8.9–10.3)
Chloride: 109 mmol/L (ref 98–111)
Creatinine, Ser: 0.84 mg/dL (ref 0.44–1.00)
GFR calc Af Amer: >60 mL/min (ref >60)
GFR calc non Af Amer: >60 mL/min (ref >60)
Glucose, Bld: 91 mg/dL (ref 70–99)
Potassium: 4.4 mmol/L (ref 3.5–5.1)
Sodium: 140 mmol/L (ref 135–145)

## 2019-10-01 LAB — CBC
HCT: 41.1 % (ref 36.0–46.0)
Hemoglobin: 12.5 g/dL (ref 12.0–15.0)
MCH: 31 pg (ref 26.0–34.0)
MCHC: 30.4 g/dL (ref 30.0–36.0)
MCV: 102 fL — ABNORMAL HIGH (ref 80.0–100.0)
Platelets: 324 10*3/uL (ref 150–400)
RBC: 4.03 MIL/uL (ref 3.87–5.11)
RDW: 13.2 % (ref 11.5–15.5)
WBC: 8.7 10*3/uL (ref 4.0–10.5)
nRBC: 0 % (ref 0.0–0.2)

## 2019-10-01 LAB — URINALYSIS, ROUTINE W REFLEX MICROSCOPIC
Bacteria, UA: NONE SEEN
Bilirubin Urine: NEGATIVE
Glucose, UA: NEGATIVE mg/dL
Hgb urine dipstick: NEGATIVE
Ketones, ur: NEGATIVE mg/dL
Nitrite: NEGATIVE
Protein, ur: NEGATIVE mg/dL
Specific Gravity, Urine: 1.011 (ref 1.005–1.030)
pH: 7 (ref 5.0–8.0)

## 2019-10-01 LAB — SURGICAL PCR SCREEN
MRSA, PCR: NEGATIVE
Staphylococcus aureus: NEGATIVE

## 2019-10-01 LAB — SARS CORONAVIRUS 2 (TAT 6-24 HRS): SARS Coronavirus 2: NEGATIVE

## 2019-10-01 NOTE — Telephone Encounter (Signed)
Please advise 

## 2019-10-01 NOTE — Progress Notes (Signed)
Anesthesia Chart Review:  Patient reports that she was evaluated by Dr. Einar Gip in 2014 for bradycardia and fatigue.  She says she had normal exercise test and normal echocardiogram.  States that ultimately her symptoms and her bradycardia were felt due to eyedrops she was using for glaucoma.  She states her symptoms resolved once the eyedrops were discontinued.  She denies any cardiovascular symptoms since that time.  More recently her primary care physician Dr. Coletta Memos ordered an echo in 2019 to evaluate 2/6 systolic murmur.  Echo results are not available in [place] however subsequent progress note from neurologist Dr. Sabra Heck on 11/04/18 commented on results and stated that the echo showed only mild aortic sclerosis and mild tricuspid regurgitation. Results have been requested.   Cough variant asthma followed by PCP, maintained on symbicort per PCP notes, however not on pt med current med list. She had normal spirometry March 2020.   Hx of Parkinsons followed by PCP.  Hx of gastrointestinal stromal tumor (GIST) of small intestine s/p SB & ileocecal resection 2009.  Preop labs reviewed, unremarkable.   EKG 04/06/19: Sinus brady. Rate 58. Probable left atrial enlargement   Wynonia Musty [facility] Short Stay Center/Anesthesiology Phone [phone] 10/04/2019 1:09 PM

## 2019-10-01 NOTE — Telephone Encounter (Signed)
Patient called. She would like to know when she should stop taking her aspirin. Her CB # is [phone]

## 2019-10-01 NOTE — Progress Notes (Signed)
PCP - Dr. Lyndel Safe from [place] and Dr. Coletta Memos @ [facility] - saw one time Dr. Einar Gip  PPM/ICD - na   Chest x-ray - 04/06/19 EKG - 04/06/19 Stress Test - na ECHO - 11/2017 Cardiac Cath - na  Sleep Study - na   Fasting Blood Sugar - na  Blood Thinner Instructions:na Aspirin Instructions: pt. To call Dr. Randel Pigg office (no one has instructed her)  ERAS Protcol -yes PRE-SURGERY Ensure given  COVID TEST- 10/01/19   Anesthesia review: cardiology  History Pt. Stated she saw cardiologist for blood pressure dropped, had an echo, Dr. Einar Gip determined it was an eye drop medication.  Patient denies shortness of breath, fever, cough and chest pain at PAT appointment   All instructions explained to the patient, with a verbal understanding of the material. Patient agrees to go over the instructions while at home for a better understanding. Patient also instructed to self quarantine after being tested for COVID-19. The opportunity to ask questions was provided.

## 2019-10-02 LAB — URINE CULTURE

## 2019-10-03 NOTE — Telephone Encounter (Signed)
Tried to call her.  Can you call and say just stop it now and should be okay

## 2019-10-04 ENCOUNTER — Encounter (HOSPITAL_COMMUNITY): Payer: Self-pay

## 2019-10-04 NOTE — Anesthesia Preprocedure Evaluation (Addendum)
Anesthesia Evaluation  Patient identified by MRN, date of birth, ID band Patient awake    Reviewed: Allergy & Precautions, H&P , NPO status , Patient's Chart, lab work & pertinent test results  Airway Mallampati: III  TM Distance: >3 FB Neck ROM: Full    Dental no notable dental hx. (+) Teeth Intact, Dental Advisory Given   Pulmonary asthma ,    Pulmonary exam normal breath sounds clear to auscultation       Cardiovascular Exercise Tolerance: Good hypertension, Pt. on medications + DOE  + dysrhythmias  Rhythm:Regular Rate:Normal     Neuro/Psych Anxiety Bipolar Disorder negative neurological ROS     GI/Hepatic Neg liver ROS, GERD  Medicated,  Endo/Other  negative endocrine ROS  Renal/GU negative Renal ROS  negative genitourinary   Musculoskeletal  (+) Arthritis , Osteoarthritis,    Abdominal   Peds  Hematology negative hematology ROS (+)   Anesthesia Other Findings   Reproductive/Obstetrics negative OB ROS                            Anesthesia Physical Anesthesia Plan  ASA: III  Anesthesia Plan: Spinal   Post-op Pain Management:  Regional for Post-op pain   Induction: Intravenous  PONV Risk Score and Plan: 3 and Ondansetron, Dexamethasone and Propofol infusion  Airway Management Planned: Simple Face Mask  Additional Equipment:   Intra-op Plan:   Post-operative Plan:   Informed Consent: I have reviewed the patients History and Physical, chart, labs and discussed the procedure including the risks, benefits and alternatives for the proposed anesthesia with the patient or authorized representative who has indicated his/her understanding and acceptance.     Dental advisory given  Plan Discussed with: CRNA  Anesthesia Plan Comments:        Anesthesia Quick Evaluation

## 2019-10-04 NOTE — Telephone Encounter (Signed)
Tried calling. No answer. Mailbox not accepting messages at this time. Will try again later.

## 2019-10-05 ENCOUNTER — Encounter (HOSPITAL_COMMUNITY)
Admission: RE | Disposition: A | Discharge status: Skilled Nursing Facility | Payer: Self-pay | Source: Home / Self Care | Attending: Orthopedic Surgery

## 2019-10-05 ENCOUNTER — Other Ambulatory Visit: Payer: Self-pay | Admitting: Internal Medicine

## 2019-10-05 ENCOUNTER — Ambulatory Visit (HOSPITAL_COMMUNITY): Payer: Medicare PPO | Admitting: Anesthesiology

## 2019-10-05 ENCOUNTER — Ambulatory Visit (HOSPITAL_COMMUNITY): Payer: Medicare PPO | Admitting: Physician Assistant

## 2019-10-05 ENCOUNTER — Encounter (HOSPITAL_COMMUNITY): Payer: Self-pay | Admitting: Orthopedic Surgery

## 2019-10-05 ENCOUNTER — Other Ambulatory Visit: Payer: Self-pay

## 2019-10-05 ENCOUNTER — Ambulatory Visit (HOSPITAL_COMMUNITY)
Admission: RE | Admit: 2019-10-05 | Discharge: 2019-10-05 | Disposition: A | Discharge status: Skilled Nursing Facility | Payer: Medicare PPO | Attending: Orthopedic Surgery | Admitting: Orthopedic Surgery

## 2019-10-05 DIAGNOSIS — M1711 Unilateral primary osteoarthritis, right knee: Secondary | ICD-10-CM | POA: Insufficient documentation

## 2019-10-05 DIAGNOSIS — F419 Anxiety disorder, unspecified: Secondary | ICD-10-CM | POA: Diagnosis not present

## 2019-10-05 DIAGNOSIS — I10 Essential (primary) hypertension: Secondary | ICD-10-CM | POA: Insufficient documentation

## 2019-10-05 DIAGNOSIS — F0280 Dementia in other diseases classified elsewhere without behavioral disturbance: Secondary | ICD-10-CM | POA: Insufficient documentation

## 2019-10-05 DIAGNOSIS — G3183 Dementia with Lewy bodies: Secondary | ICD-10-CM | POA: Insufficient documentation

## 2019-10-05 HISTORY — PX: TOTAL KNEE ARTHROPLASTY: SHX125

## 2019-10-05 SURGERY — ARTHROPLASTY, KNEE, TOTAL
Anesthesia: Spinal | Site: Knee | Laterality: Right

## 2019-10-05 MED ORDER — SODIUM CHLORIDE 0.9 % IR SOLN
Status: DC | PRN
  Administered 2019-10-05: 3000 mL

## 2019-10-05 MED ORDER — FENTANYL CITRATE (PF) 100 MCG/2ML IJ SOLN
INTRAMUSCULAR | Status: DC | PRN
  Administered 2019-10-05: 25 ug via INTRAVENOUS

## 2019-10-05 MED ORDER — LIDOCAINE 2% (20 MG/ML) 5 ML SYRINGE
INTRAMUSCULAR | Status: AC
  Filled 2019-10-05: qty 5

## 2019-10-05 MED ORDER — POVIDONE-IODINE 10 % EX SWAB: 2.0000 "application " | Freq: Once | CUTANEOUS | Status: DC

## 2019-10-05 MED ORDER — TRANEXAMIC ACID-NACL 1000-0.7 MG/100ML-% IV SOLN
1000.0000 mg | INTRAVENOUS | Status: DC
  Filled 2019-10-05: qty 100

## 2019-10-05 MED ORDER — 0.9 % SODIUM CHLORIDE (POUR BTL) OPTIME
TOPICAL | Status: DC | PRN
  Administered 2019-10-05 (×4): 1000 mL

## 2019-10-05 MED ORDER — ACETAMINOPHEN 325 MG PO TABS: 650.0000 mg | ORAL_TABLET | Freq: Once | ORAL | Status: DC

## 2019-10-05 MED ORDER — DEXAMETHASONE SODIUM PHOSPHATE 10 MG/ML IJ SOLN
INTRAMUSCULAR | Status: DC | PRN
  Administered 2019-10-05: 5 mg via INTRAVENOUS

## 2019-10-05 MED ORDER — ACETAMINOPHEN 500 MG PO TABS
1000.0000 mg | ORAL_TABLET | Freq: Once | ORAL | Status: AC
  Administered 2019-10-05: 500 mg via ORAL
  Filled 2019-10-05: qty 2

## 2019-10-05 MED ORDER — CHLORHEXIDINE GLUCONATE 0.12 % MT SOLN
15.0000 mL | Freq: Once | OROMUCOSAL | Status: AC
  Administered 2019-10-05: 15 mL via OROMUCOSAL
  Filled 2019-10-05: qty 15

## 2019-10-05 MED ORDER — MORPHINE SULFATE (PF) 4 MG/ML IV SOLN
INTRAVENOUS | Status: DC | PRN
Start: 2019-10-05 — End: 2019-10-05
  Administered 2019-10-05: 8 mg

## 2019-10-05 MED ORDER — PROPOFOL 1000 MG/100ML IV EMUL
INTRAVENOUS | Status: AC
  Filled 2019-10-05: qty 100

## 2019-10-05 MED ORDER — TRANEXAMIC ACID-NACL 1000-0.7 MG/100ML-% IV SOLN
INTRAVENOUS | Status: AC
  Filled 2019-10-05: qty 100

## 2019-10-05 MED ORDER — ORAL CARE MOUTH RINSE: 15.0000 mL | Freq: Once | OROMUCOSAL | Status: AC

## 2019-10-05 MED ORDER — BUPIVACAINE HCL 0.25 % IJ SOLN
INTRAMUSCULAR | Status: DC | PRN
  Administered 2019-10-05: 30 mL

## 2019-10-05 MED ORDER — CLINDAMYCIN HCL 300 MG PO CAPS: 300.0000 mg | ORAL_CAPSULE | Freq: Two times a day (BID) | ORAL | 0 refills | Status: AC

## 2019-10-05 MED ORDER — ROPINIROLE HCL 0.5 MG PO TABS
0.5000 mg | ORAL_TABLET | Freq: Once | ORAL | Status: AC
  Administered 2019-10-05: 0.5 mg via ORAL
  Filled 2019-10-05: qty 1

## 2019-10-05 MED ORDER — TRANEXAMIC ACID 1000 MG/10ML IV SOLN
2000.0000 mg | Freq: Once | INTRAVENOUS | Status: DC
  Filled 2019-10-05: qty 20

## 2019-10-05 MED ORDER — BUPIVACAINE-EPINEPHRINE (PF) 0.5% -1:200000 IJ SOLN
INTRAMUSCULAR | Status: DC | PRN
  Administered 2019-10-05: 20 mL via PERINEURAL

## 2019-10-05 MED ORDER — SODIUM CHLORIDE (PF) 0.9 % IJ SOLN
INTRAMUSCULAR | Status: DC | PRN
  Administered 2019-10-05: 20 mL

## 2019-10-05 MED ORDER — ONDANSETRON HCL 4 MG/2ML IJ SOLN
INTRAMUSCULAR | Status: AC
  Filled 2019-10-05: qty 2

## 2019-10-05 MED ORDER — BUPIVACAINE IN DEXTROSE 0.75-8.25 % IT SOLN
INTRATHECAL | Status: DC | PRN
  Administered 2019-10-05: 1.8 mL via INTRATHECAL

## 2019-10-05 MED ORDER — IRRISEPT - 450ML BOTTLE WITH 0.05% CHG IN STERILE WATER, USP 99.95% OPTIME
TOPICAL | Status: DC | PRN
  Administered 2019-10-05: 450 mL via TOPICAL

## 2019-10-05 MED ORDER — MIDAZOLAM HCL 2 MG/2ML IJ SOLN
INTRAMUSCULAR | Status: AC
  Filled 2019-10-05: qty 2

## 2019-10-05 MED ORDER — VANCOMYCIN HCL 1000 MG IV SOLR
INTRAVENOUS | Status: AC
  Filled 2019-10-05: qty 1000

## 2019-10-05 MED ORDER — CLONIDINE HCL (ANALGESIA) 100 MCG/ML EP SOLN
EPIDURAL | Status: DC | PRN
  Administered 2019-10-05: 1 mL

## 2019-10-05 MED ORDER — VANCOMYCIN HCL 1000 MG IV SOLR
INTRAVENOUS | Status: DC | PRN
  Administered 2019-10-05: 1000 mg via TOPICAL

## 2019-10-05 MED ORDER — CLONIDINE HCL (ANALGESIA) 100 MCG/ML EP SOLN
EPIDURAL | Status: AC
  Filled 2019-10-05: qty 10

## 2019-10-05 MED ORDER — FENTANYL CITRATE (PF) 100 MCG/2ML IJ SOLN: 25.0000 ug | INTRAMUSCULAR | Status: DC | PRN

## 2019-10-05 MED ORDER — CLONIDINE HCL (ANALGESIA) 100 MCG/ML EP SOLN
EPIDURAL | Status: DC | PRN
  Administered 2019-10-05: 50 ug

## 2019-10-05 MED ORDER — POVIDONE-IODINE 10 % EX SWAB
2.0000 "application " | Freq: Once | CUTANEOUS | Status: AC
  Administered 2019-10-05: 2 via TOPICAL

## 2019-10-05 MED ORDER — ACETAMINOPHEN 325 MG PO TABS
ORAL_TABLET | ORAL | Status: AC
  Filled 2019-10-05: qty 2

## 2019-10-05 MED ORDER — OXYCODONE HCL 5 MG PO TABS: 5.0000 mg | ORAL_TABLET | ORAL | 0 refills | Status: DC | PRN

## 2019-10-05 MED ORDER — MIDAZOLAM HCL 5 MG/5ML IJ SOLN
INTRAMUSCULAR | Status: DC | PRN
  Administered 2019-10-05 (×2): 1 mg via INTRAVENOUS

## 2019-10-05 MED ORDER — DEXAMETHASONE SODIUM PHOSPHATE 10 MG/ML IJ SOLN
INTRAMUSCULAR | Status: AC
  Filled 2019-10-05: qty 1

## 2019-10-05 MED ORDER — OXYCODONE HCL 5 MG PO TABS
ORAL_TABLET | ORAL | Status: AC
  Administered 2019-10-05: 5 mg
  Filled 2019-10-05: qty 1

## 2019-10-05 MED ORDER — BUPIVACAINE LIPOSOME 1.3 % IJ SUSP
INTRAMUSCULAR | Status: DC | PRN
  Administered 2019-10-05: 20 mL

## 2019-10-05 MED ORDER — VANCOMYCIN HCL IN DEXTROSE 1-5 GM/200ML-% IV SOLN
1000.0000 mg | INTRAVENOUS | Status: AC
  Administered 2019-10-05 (×2): 1000 mg via INTRAVENOUS
  Filled 2019-10-05: qty 200

## 2019-10-05 MED ORDER — POVIDONE-IODINE 7.5 % EX SOLN
Freq: Once | CUTANEOUS | Status: DC
  Filled 2019-10-05: qty 118

## 2019-10-05 MED ORDER — LACTATED RINGERS IV SOLN: INTRAVENOUS | Status: DC

## 2019-10-05 MED ORDER — BUPIVACAINE LIPOSOME 1.3 % IJ SUSP
20.0000 mL | Freq: Once | INTRAMUSCULAR | Status: DC
  Filled 2019-10-05: qty 20

## 2019-10-05 MED ORDER — BUPIVACAINE HCL (PF) 0.25 % IJ SOLN
INTRAMUSCULAR | Status: AC
  Filled 2019-10-05: qty 30

## 2019-10-05 MED ORDER — ONDANSETRON HCL 4 MG/2ML IJ SOLN
INTRAMUSCULAR | Status: DC | PRN
  Administered 2019-10-05: 4 mg via INTRAVENOUS

## 2019-10-05 MED ORDER — MORPHINE SULFATE (PF) 4 MG/ML IV SOLN
INTRAVENOUS | Status: AC
  Filled 2019-10-05: qty 2

## 2019-10-05 MED ORDER — PROPOFOL 500 MG/50ML IV EMUL
INTRAVENOUS | Status: DC | PRN
  Administered 2019-10-05: 25 ug/kg/min via INTRAVENOUS

## 2019-10-05 MED ORDER — ALPRAZOLAM 0.5 MG PO TABS: 0.5000 mg | ORAL_TABLET | Freq: Two times a day (BID) | ORAL | 0 refills | Status: DC | PRN

## 2019-10-05 MED ORDER — PROPOFOL 10 MG/ML IV BOLUS
INTRAVENOUS | Status: DC | PRN
  Administered 2019-10-05: 50 mg via INTRAVENOUS

## 2019-10-05 MED ORDER — TRANEXAMIC ACID 1000 MG/10ML IV SOLN
INTRAVENOUS | Status: DC | PRN
  Administered 2019-10-05: 2000 mg via TOPICAL

## 2019-10-05 MED ORDER — FENTANYL CITRATE (PF) 250 MCG/5ML IJ SOLN
INTRAMUSCULAR | Status: AC
  Filled 2019-10-05: qty 5

## 2019-10-05 MED ORDER — CELECOXIB 100 MG PO CAPS: 100.0000 mg | ORAL_CAPSULE | Freq: Two times a day (BID) | ORAL | 0 refills | Status: DC

## 2019-10-05 SURGICAL SUPPLY — 83 items
BAG DECANTER FOR FLEXI CONT (MISCELLANEOUS) ×3 IMPLANT
BANDAGE ESMARK 6X9 LF (GAUZE/BANDAGES/DRESSINGS) ×1 IMPLANT
BLADE SAG 18X100X1.27 (BLADE) ×3 IMPLANT
BNDG CMPR 9X6 STRL LF SNTH (GAUZE/BANDAGES/DRESSINGS) ×1
BNDG CMPR MED 15X6 ELC VLCR LF (GAUZE/BANDAGES/DRESSINGS) ×1
BNDG COHESIVE 6X5 TAN STRL LF (GAUZE/BANDAGES/DRESSINGS) ×3 IMPLANT
BNDG ELASTIC 6X15 VLCR STRL LF (GAUZE/BANDAGES/DRESSINGS) ×3 IMPLANT
BNDG ELASTIC 6X5.8 VLCR STR LF (GAUZE/BANDAGES/DRESSINGS) ×2 IMPLANT
BNDG ESMARK 6X9 LF (GAUZE/BANDAGES/DRESSINGS) ×3
BOWL SMART MIX CTS (DISPOSABLE) IMPLANT
BSPLAT TIB 4 KN TRITANIUM (Knees) ×1 IMPLANT
CLOSURE STERI-STRIP 1/2X4 (GAUZE/BANDAGES/DRESSINGS) ×1
CLOSURE WOUND 1/2 X4 (GAUZE/BANDAGES/DRESSINGS) ×2
CLSR STERI-STRIP ANTIMIC 1/2X4 (GAUZE/BANDAGES/DRESSINGS) ×1 IMPLANT
CNTNR URN SCR LID CUP LEK RST (MISCELLANEOUS) ×1 IMPLANT
COMPONENT TRI CR RETAIN KNEE (Orthopedic Implant) IMPLANT
CONT SPEC 4OZ STRL OR WHT (MISCELLANEOUS) ×3
COVER SURGICAL LIGHT HANDLE (MISCELLANEOUS) ×3 IMPLANT
COVER WAND RF STERILE (DRAPES) ×3 IMPLANT
CUFF TOURN SGL QUICK 34 (TOURNIQUET CUFF) ×3
CUFF TOURN SGL QUICK 42 (TOURNIQUET CUFF) IMPLANT
CUFF TRNQT CYL 34X4.125X (TOURNIQUET CUFF) ×1 IMPLANT
DECANTER SPIKE VIAL GLASS SM (MISCELLANEOUS) ×3 IMPLANT
DRAPE INCISE IOBAN 66X45 STRL (DRAPES) IMPLANT
DRAPE ORTHO SPLIT 77X108 STRL (DRAPES) ×9
DRAPE SURG ORHT 6 SPLT 77X108 (DRAPES) ×3 IMPLANT
DRAPE U-SHAPE 47X51 STRL (DRAPES) ×3 IMPLANT
DRSG AQUACEL AG ADV 3.5X14 (GAUZE/BANDAGES/DRESSINGS) ×2 IMPLANT
DURAPREP 26ML APPLICATOR (WOUND CARE) ×6 IMPLANT
ELECT CAUTERY BLADE 6.4 (BLADE) ×3 IMPLANT
ELECT REM PT RETURN 9FT ADLT (ELECTROSURGICAL) ×3
ELECTRODE REM PT RTRN 9FT ADLT (ELECTROSURGICAL) ×1 IMPLANT
GAUZE SPONGE 4X4 12PLY STRL (GAUZE/BANDAGES/DRESSINGS) ×3 IMPLANT
GLOVE BIOGEL PI IND STRL 7.0 (GLOVE) ×1 IMPLANT
GLOVE BIOGEL PI IND STRL 8 (GLOVE) ×1 IMPLANT
GLOVE BIOGEL PI INDICATOR 7.0 (GLOVE) ×2
GLOVE BIOGEL PI INDICATOR 8 (GLOVE) ×2
GLOVE ECLIPSE 7.0 STRL STRAW (GLOVE) ×3 IMPLANT
GLOVE ECLIPSE 8.0 STRL XLNG CF (GLOVE) ×3 IMPLANT
GOWN STRL REUS W/ TWL LRG LVL3 (GOWN DISPOSABLE) ×3 IMPLANT
GOWN STRL REUS W/TWL LRG LVL3 (GOWN DISPOSABLE) ×9
HANDPIECE INTERPULSE COAX TIP (DISPOSABLE) ×3
HOOD PEEL AWAY FLYTE STAYCOOL (MISCELLANEOUS) ×9 IMPLANT
IMMOBILIZER KNEE 20 (SOFTGOODS)
IMMOBILIZER KNEE 20 THIGH 36 (SOFTGOODS) IMPLANT
IMMOBILIZER KNEE 22 UNIV (SOFTGOODS) ×2 IMPLANT
IMMOBILIZER KNEE 24 THIGH 36 (MISCELLANEOUS) IMPLANT
IMMOBILIZER KNEE 24 UNIV (MISCELLANEOUS)
INSERT TRIATH X3 SZ4 9 (Insert) ×2 IMPLANT
KIT BASIN OR (CUSTOM PROCEDURE TRAY) ×3 IMPLANT
KIT TURNOVER KIT B (KITS) ×3 IMPLANT
KNEE PATELLA ASYMMETRIC 10X32 (Knees) ×2 IMPLANT
KNEE TIBIAL COMP TRI SZ4 (Knees) ×2 IMPLANT
MANIFOLD NEPTUNE II (INSTRUMENTS) ×3 IMPLANT
NDL SPNL 18GX3.5 QUINCKE PK (NEEDLE) ×1 IMPLANT
NEEDLE 22X1 1/2 (OR ONLY) (NEEDLE) ×6 IMPLANT
NEEDLE SPNL 18GX3.5 QUINCKE PK (NEEDLE) ×3 IMPLANT
NS IRRIG 1000ML POUR BTL (IV SOLUTION) ×6 IMPLANT
PACK TOTAL JOINT (CUSTOM PROCEDURE TRAY) ×3 IMPLANT
PAD ARMBOARD 7.5X6 YLW CONV (MISCELLANEOUS) ×6 IMPLANT
PAD CAST 4YDX4 CTTN HI CHSV (CAST SUPPLIES) ×1 IMPLANT
PADDING CAST COTTON 4X4 STRL (CAST SUPPLIES) ×3
PADDING CAST COTTON 6X4 STRL (CAST SUPPLIES) ×3 IMPLANT
PIN FLUTED HEDLESS FIX 3.5X1/8 (PIN) ×2 IMPLANT
SET HNDPC FAN SPRY TIP SCT (DISPOSABLE) ×1 IMPLANT
STRIP CLOSURE SKIN 1/2X4 (GAUZE/BANDAGES/DRESSINGS) ×4 IMPLANT
SUCTION FRAZIER HANDLE 10FR (MISCELLANEOUS) ×3
SUCTION TUBE FRAZIER 10FR DISP (MISCELLANEOUS) ×1 IMPLANT
SUT MNCRL AB 3-0 PS2 18 (SUTURE) ×3 IMPLANT
SUT MNCRL AB 3-0 PS2 27 (SUTURE) ×2 IMPLANT
SUT VIC AB 0 CT1 27 (SUTURE) ×9
SUT VIC AB 0 CT1 27XBRD ANBCTR (SUTURE) ×3 IMPLANT
SUT VIC AB 1 CT1 27 (SUTURE) ×21
SUT VIC AB 1 CT1 27XBRD ANBCTR (SUTURE) ×5 IMPLANT
SUT VIC AB 2-0 CT1 27 (SUTURE) ×12
SUT VIC AB 2-0 CT1 TAPERPNT 27 (SUTURE) ×4 IMPLANT
SYR 30ML LL (SYRINGE) ×9 IMPLANT
SYR TB 1ML LUER SLIP (SYRINGE) ×3 IMPLANT
TOWEL GREEN STERILE (TOWEL DISPOSABLE) ×6 IMPLANT
TOWEL GREEN STERILE FF (TOWEL DISPOSABLE) ×6 IMPLANT
TRAY CATH 16FR W/PLASTIC CATH (SET/KITS/TRAYS/PACK) IMPLANT
TRIA CRUCIATE RETAIN KNEE (Orthopedic Implant) ×3 IMPLANT
WATER STERILE IRR 1000ML POUR (IV SOLUTION) IMPLANT

## 2019-10-05 NOTE — H&P (Signed)
TOTAL KNEE ADMISSION H&P  Patient is being admitted for right total knee arthroplasty.  Subjective:  Chief Complaint:right knee pain.  HPI: Tracey [name], 79 y.o. female, has a history of pain and functional disability in the right knee due to arthritis and has failed non-surgical conservative treatments for greater than 12 weeks to includeNSAID's and/or analgesics, corticosteriod injections, use of assistive devices and activity modification.  Onset of symptoms was gradual, starting 9 years ago with gradually worsening course since that time. The patient noted no past surgery on the right knee(s).  Patient currently rates pain in the right knee(s) at 9 out of 10 with activity. Patient has night pain, worsening of pain with activity and weight bearing, pain that interferes with activities of daily living, pain with passive range of motion, crepitus and joint swelling.  Patient has evidence of subchondral sclerosis and joint space narrowing by imaging studies. This patient has had A full discussion about the risk and benefits of surgery as well as the importance of outpatient surgery at this particular time in our treatment of the pandemic.  Patient is agreeable and completely desires to pursue outpatient total knee replacement.  [facility] including physical therapy and nursing care.  Patient understands that it will be a potentially difficult road in terms of rehabilitation and pain control in the early days.  Nonetheless despite these issues she does wish to proceed with total knee replacement as an outpatient.  Risk benefits are discussed.  All questions answered.. There is no active infection.  Patient Active Problem List   Diagnosis Date Noted  . Edema 07/01/2019  . Right knee pain 06/17/2019  . Hypercalcemia 06/17/2019  . Slow transit constipation 05/31/2019  . Bradycardia 05/31/2019  . History of shingles 04/22/2019  . Upper  back pain on right side 04/01/2019  . DOE (dyspnea on exertion) 03/25/2018  . Cough variant asthma 03/24/2018  . Parkinsonism ([place]) 11/15/2015  . Diplopia 10/16/2015  . SBO (small bowel obstruction) ([place]) 09/18/2013  . Glaucoma 09/18/2013  . GERD (gastroesophageal reflux disease) 09/18/2013  . HLD (hyperlipidemia) 02/21/2013  . Essential (primary) hypertension 02/21/2013  . Barrett esophagus 02/19/2013  . Cardiac conduction disorder 03/30/2012  . Difficulty hearing 08/20/2011  . 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 Allergic rhinitis 09/14/2010  . Anxiety state 12/22/2009  . Bipolar I disorder, single manic episode, in full remission ([place]) 12/01/2009  . 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 History of colon polyps 10/24/2008  . Arthritis, degenerative 10/24/2008   Past Medical History:  Diagnosis Date  . Anxiety   . Arthritis   . Cardiac conduction disorder 03/30/2012   Overview:  STORY: ETT 03/09/2012 Echo 03/07/2012 normal Dr Einar Gip, bradycardia felt due to glaucoma eye drops  . Diverticulosis of colon   . GIST (gastrointestinal stroma tumor), malignant, colon ([place])   . Heart murmur   . History of colon polyps 10/24/2008  . Hypertension   . Major neurocognitive disorder due to Parkinson's disease, possible ([place])    Tremors possible parkinsons  . Manic disorder, single episode, in full remission ([place]) 12/01/2009  . Sixth nerve palsy     Past Surgical History:  Procedure Laterality Date  . BILATERAL SALPINGOOPHORECTOMY  09/22/2007  . Gastrointestinal Stroma Tumor,  Other  1960   GIST Surgery  . ILEOCECETOMY  09/22/2007  . OVARIAN CYST REMOVAL Right 1967  .  SMALL INTESTINE SURGERY  09/22/2007  . TOTAL VAGINAL HYSTERECTOMY  1986   Fibroids    Current Facility-Administered Medications  Medication Dose Route Frequency Provider Last Rate Last Admin  . bupivacaine liposome (EXPAREL) 1.3 %  injection 266 mg  20 mL Infiltration Once Meredith Pel, MD      . lactated ringers infusion   Intravenous Continuous Roderic Palau, MD 10 mL/hr at 10/05/19 0658 New Bag at 10/05/19 7062  . povidone-iodine (BETADINE) 7.5 % scrub   Topical Once Magnant, Charles L, PA-C      . povidone-iodine 10 % swab 2 application  2 application Topical Once Magnant, Charles L, PA-C      . tranexamic acid (CYKLOKAPRON) 2,000 mg in sodium chloride 0.9 % 50 mL Topical Application  3,762 mg Topical Once Meredith Pel, MD      . tranexamic acid (CYKLOKAPRON) IVPB 1,000 mg  1,000 mg Intravenous To OR Magnant, Charles L, PA-C      . vancomycin (VANCOCIN) IVPB 1000 mg/200 mL premix  1,000 mg Intravenous On Call to OR Magnant, Charles L, PA-C 200 mL/hr at 10/05/19 0659 1,000 mg at 10/05/19 0659   Allergies  Allergen Reactions  . Lisinopril Cough  . Penicillins Itching    50 years ago  . Adhesive [Tape] Itching  . Atorvastatin Itching  . Dilaudid [Hydromorphone Hcl] Itching  . Hydromorphone Itching    Social History   Tobacco Use  . Smoking status: Never Smoker  . Smokeless tobacco: Never Used  Substance Use Topics  . Alcohol use: No    Family History  Problem Relation Age of Onset  . Congestive Heart Failure Mother   . Dementia Mother   . Heart disease Father   . Diabetes Father   . Lung cancer Son      Review of Systems  Musculoskeletal: Positive for arthralgias.  All other systems reviewed and are negative.   Objective:  Physical Exam Vitals reviewed.  HENT:     Head: Normocephalic.     Nose: Nose normal.     Mouth/Throat:     Mouth: Mucous membranes are moist.  Eyes:     Pupils: Pupils are equal, round, and reactive to light.  Cardiovascular:     Rate and Rhythm: Normal rate.  Pulmonary:     Effort: Pulmonary effort is normal.  Abdominal:     General: Abdomen is flat.  Musculoskeletal:     Cervical back: Normal range of motion.  Skin:    General: Skin is warm.      Capillary Refill: Capillary refill takes less than 2 seconds.  Neurological:     General: No focal deficit present.     Mental Status: She is alert.  Psychiatric:        Mood and Affect: Mood normal.    Examination of the right knee demonstrates intact skin.  Range of motion 10-100.  Pedal pulses palpable.  Varus alignment present.  Extensor mechanism intact. Vital signs in last 24 hours: Temp:  [98.3 F (36.8 C)] 98.3 F (36.8 C) (09/21 0605) Pulse Rate:  [64] 64 (09/21 0605) Resp:  [17] 17 (09/21 0605) BP: (163)/(75) 163/75 (09/21 0605) SpO2:  [100 %] 100 % (09/21 0605) Weight:  [65.8 kg] 65.8 kg (09/21 0605)  Labs:   Estimated body mass index is 26.52 kg/m as calculated from the following:   Height as of this encounter: 5\' 2"  (1.575 m).   Weight as of this encounter: 65.8 kg.   Imaging  Review Plain radiographs demonstrate severe degenerative joint disease of the right knee(s). The overall alignment ismild varus. The bone quality appears to be good for age and reported activity level.      Assessment/Plan:  End stage arthritis, right knee   The patient history, physical examination, clinical judgment of the provider and imaging studies are consistent with end stage degenerative joint disease of the right knee(s) and total knee arthroplasty is deemed medically necessary. The treatment options including medical management, injection therapy arthroscopy and arthroplasty were discussed at length. The risks and benefits of total knee arthroplasty were presented and reviewed. The risks due to aseptic loosening, infection, stiffness, patella tracking problems, thromboembolic complications and other imponderables were discussed. The patient acknowledged the explanation, agreed to proceed with the plan and consent was signed. Patient is being admitted for inpatient treatment for surgery, pain control, PT, OT, prophylactic antibiotics, VTE prophylaxis, progressive ambulation and  ADL's and discharge planning. The patient is planning to be discharged home with home health services     Patient's anticipated LOS is less than 2 midnights, meeting these requirements: - Younger than 43 - Lives within 1 hour of care - Has a competent adult at home to recover with post-op recover - NO history of  - Chronic pain requiring opiods  - Diabetes  - Coronary Artery Disease  - Heart failure  - Heart attack  - Stroke  - DVT/VTE  - Cardiac arrhythmia  - Respiratory Failure/COPD  - Renal failure  - Anemia  - Advanced Liver disease

## 2019-10-05 NOTE — Transfer of Care (Signed)
Immediate Anesthesia Transfer of Care Note  Patient: Tracey [name]  Procedure(s) Performed: RIGHT TOTAL KNEE ARTHROPLASTY (Right Knee)  Patient Location: PACU  Anesthesia Type:Spinal  Level of Consciousness: awake, alert , oriented, patient cooperative and responds to stimulation  Airway & Oxygen Therapy: Patient Spontanous Breathing and Patient connected to nasal cannula oxygen  Post-op Assessment: Report given to RN and Post -op Vital signs reviewed and stable  Post vital signs: Reviewed and stable  Last Vitals:  Vitals Value Taken Time  BP 124/53 10/05/19 0957  Temp    Pulse 47 10/05/19 0959  Resp 18 10/05/19 0959  SpO2 99 % 10/05/19 0959  Vitals shown include unvalidated device data.  Last Pain:  Vitals:   10/05/19 0702  TempSrc:   PainSc: 0-No pain      Patients Stated Pain Goal: 2 (85/50/15 8682)  Complications: No complications documented.

## 2019-10-05 NOTE — Anesthesia Procedure Notes (Signed)
Anesthesia Regional Block: Adductor canal block   Pre-Anesthetic Checklist: ,, timeout performed, Correct Patient, Correct Site, Correct Laterality, Correct Procedure, Correct Position, site marked, Risks and benefits discussed, pre-op evaluation,  At surgeon's request and post-op pain management  Laterality: Right  Prep: Maximum Sterile Barrier Precautions used, chloraprep       Needles:  Injection technique: Single-shot  Needle Type: Echogenic Stimulator Needle     Needle Length: 9cm  Needle Gauge: 21     Additional Needles:   Procedures:,,,, ultrasound used (permanent image in chart),,,,  Narrative:  Start time: 10/05/2019 6:54 AM End time: 10/05/2019 7:04 AM Injection made incrementally with aspirations every 5 mL.  Performed by: Personally  Anesthesiologist: Roderic Palau, MD  Additional Notes: 2% Lidocaine skin wheel.

## 2019-10-05 NOTE — Evaluation (Signed)
Physical Therapy Evaluation Patient Details Name: Tracey [name] MRN: [id number] DOB: Mar 26, 1940 Today's Date: 10/05/2019   History of Present Illness  Pt is a 79 y/o female s/p R TKA. PMH includes Parkinson's and bipolar disorder.   Clinical Impression  Pt admitted secondary to problem above with deficits below. Pt requiring mod A +2 to stand at edge of stretcher this session. Pt with block effects and was unable to take steps with RLE. Reviewed knee precautions and supine HEP. Per pt, reports she [facility] [place]. Pt also reports son plans to take her back. Will likely need assist from staff to get out of car at SNF, so need to ensure they are able to assist. If they cannot provide assist, may need PTAR transport. Will continue to follow acutely to maximize functional mobility independence and safety.      Follow Up Recommendations Follow surgeon's recommendation for DC plan and follow-up therapies;SNF    Equipment Recommendations  None recommended by PT    Recommendations for Other Services       Precautions / Restrictions Precautions Precautions: Fall;Knee Precaution Booklet Issued: Yes (comment) Precaution Comments: Reviewed knee precautions with pt.  Restrictions Weight Bearing Restrictions: Yes RLE Weight Bearing: Weight bearing as tolerated      Mobility  Bed Mobility Overal bed mobility: Needs Assistance Bed Mobility: Supine to Sit;Sit to Supine     Supine to sit: Mod assist Sit to supine: Max assist;+2 for physical assistance   General bed mobility comments: Mod A for trunk assist and LE assist to come to sitting. Required max A +2 for trunk and LE assist for return to supine on higher stretcher height as pt unable to scoot hips back.   Transfers Overall transfer level: Needs assistance Equipment used: Rolling walker (2 wheeled) Transfers: Sit to/from Stand Sit to Stand: Mod assist;+2 physical assistance          General transfer comment: Mod A +2 for lift assist and steadying to stand. Stood X2 this session. Noted increased buckling in RLE, even with KI. Pt was unable to take steps as block still in RLE.   Ambulation/Gait                Stairs            Wheelchair Mobility    Modified Rankin (Stroke Patients Only)       Balance Overall balance assessment: Needs assistance Sitting-balance support: No upper extremity supported;Feet supported Sitting balance-Leahy Scale: Fair     Standing balance support: Bilateral upper extremity supported;During functional activity Standing balance-Leahy Scale: Poor Standing balance comment: Reliant on BUE and external support                              Pertinent Vitals/Pain Pain Assessment: Faces Faces Pain Scale: Hurts little more Pain Location: R knee Pain Descriptors / Indicators: Operative site guarding;Grimacing Pain Intervention(s): Limited activity within patient's tolerance;Monitored during session;Repositioned    Home Living Family/patient expects to be discharged to:: Skilled nursing facility                 Additional Comments: Plans to go to rehab at [facility]    Prior Function Level of Independence: Independent with assistive device(s)         Comments: Was using rollator for ambulation      Hand Dominance  Extremity/Trunk Assessment   Upper Extremity Assessment Upper Extremity Assessment: Generalized weakness    Lower Extremity Assessment Lower Extremity Assessment: RLE deficits/detail RLE Deficits / Details: Deficits consistent with post op pain and weakness. Decreaased coorndination noted when standing.  RLE Sensation: decreased light touch    Cervical / Trunk Assessment Cervical / Trunk Assessment: Normal  Communication   Communication: No difficulties  Cognition Arousal/Alertness: Awake/alert Behavior During Therapy: WFL for tasks assessed/performed Overall  Cognitive Status: No family/caregiver present to determine baseline cognitive functioning                                 General Comments: Increased time required to answer questions. Slowed processing ntoed.       General Comments      Exercises Total Joint Exercises Ankle Circles/Pumps: AROM;Both;10 reps Quad Sets: AROM;Right;10 reps Heel Slides: AAROM;Right;5 reps;Supine   Assessment/Plan    PT Assessment Patient needs continued PT services  PT Problem List Decreased strength;Decreased balance;Decreased range of motion;Decreased coordination;Decreased mobility;Decreased knowledge of use of DME;Decreased knowledge of precautions       PT Treatment Interventions DME instruction;Gait training;Functional mobility training;Therapeutic activities;Therapeutic exercise;Balance training;Patient/family education    PT Goals (Current goals can be found in the Care Plan section)  Acute Rehab PT Goals Patient Stated Goal: to go to rehab prior to return to ILF  PT Goal Formulation: With patient Time For Goal Achievement: 10/19/19 Potential to Achieve Goals: Good    Frequency 7X/week   Barriers to discharge        Co-evaluation               AM-PAC PT "6 Clicks" Mobility  Outcome Measure Help needed turning from your back to your side while in a flat bed without using bedrails?: A Little Help needed moving from lying on your back to sitting on the side of a flat bed without using bedrails?: A Lot Help needed moving to and from a bed to a chair (including a wheelchair)?: A Lot Help needed standing up from a chair using your arms (e.g., wheelchair or bedside chair)?: A Lot Help needed to walk in hospital room?: Total Help needed climbing 3-5 steps with a railing? : Total 6 Click Score: 11    End of Session Equipment Utilized During Treatment: Gait belt;Right knee immobilizer Activity Tolerance: Patient tolerated treatment well Patient left: in bed;with call  bell/phone within reach (on stretcher in PACU) Nurse Communication: Mobility status PT Visit Diagnosis: Unsteadiness on feet (R26.81);Muscle weakness (generalized) (M62.81)    Time: 8242-3536 PT Time Calculation (min) (ACUTE ONLY): 35 min   Charges:   PT Evaluation $PT Eval Low Complexity: 1 Low PT Treatments $Therapeutic Activity: 8-22 mins        Lou Miner, DPT  Acute Rehabilitation Services  Pager: [phone] Office: [phone]   Rudean Hitt 10/05/2019, 11:41 AM

## 2019-10-05 NOTE — Discharge Summary (Addendum)
Physician Discharge Summary      Patient ID: Tracey [name] MRN: [id number] DOB/AGE: Mar 06, 1940 79 y.o.  Admit date: 10/05/2019 Discharge date: 10/05/2019  Admission Diagnoses:  Active Problems:   * No active hospital problems. *   Discharge Diagnoses:  Same  Surgeries: Procedure(s): RIGHT TOTAL KNEE ARTHROPLASTY on 10/05/2019   Consultants:    Discharged Condition: Stable  Hospital Course: Tracey [name] is an 79 y.o. female who was admitted 10/05/2019 with a chief complaint of right knee pain, and found to have a diagnosis of right knee OA.  They were brought to the operating room on 10/05/2019 and underwent the above named procedures.  Pt awoke from anesthesia without complication and was transferred to PACU.  She mobilized well with PT and was cleared for discharge back to her facility.  Pt will f/u with Dr. Marlou Sa in clinic in ~2 weeks.   Antibiotics given:  Anti-infectives (From admission, onward)    Start     Dose/Rate Route Frequency Ordered Stop   10/05/19 0909  vancomycin (VANCOCIN) powder  Status:  Discontinued          As needed 10/05/19 0909 10/05/19 0953   10/05/19 0615  vancomycin (VANCOCIN) IVPB 1000 mg/200 mL premix        1,000 mg 200 mL/hr over 60 Minutes Intravenous On call to O.R. 10/05/19 0601 10/05/19 0751   10/05/19 0000  clindamycin (CLEOCIN) 300 MG capsule        300 mg Oral 2 times daily 10/05/19 1024 10/06/19 2359     .  Recent vital signs:  Vitals:   10/05/19 1115 10/05/19 1153  BP: (!) 153/64 140/69  Pulse: (!) 50 (!) 47  Resp: 12 15  Temp:    SpO2: 99% 97%    Recent laboratory studies:  Results for orders placed or performed during the hospital encounter of 10/01/19  Surgical pcr screen   Specimen: Nasal Mucosa; Nasal Swab  Result Value Ref Range   MRSA, PCR NEGATIVE NEGATIVE   Staphylococcus aureus NEGATIVE NEGATIVE  Urine culture   Specimen: Urine, Clean Catch  Result Value Ref Range   Specimen Description URINE, CLEAN CATCH      Special Requests      NONE Performed at [facility], [address], [place], [place] [address]    Culture MULTIPLE SPECIES PRESENT, SUGGEST RECOLLECTION (A)    Report Status 10/02/2019 FINAL   Basic metabolic panel  Result Value Ref Range   Sodium 140 135 - 145 mmol/L   Potassium 4.4 3.5 - 5.1 mmol/L   Chloride 109 98 - 111 mmol/L   CO2 24 22 - 32 mmol/L   Glucose, Bld 91 70 - 99 mg/dL   BUN 20 8 - 23 mg/dL   Creatinine, Ser 0.84 0.44 - 1.00 mg/dL   Calcium 10.9 (H) 8.9 - 10.3 mg/dL   GFR calc non Af Amer >60 >60 mL/min   GFR calc Af Amer >60 >60 mL/min   Anion gap 7 5 - 15  CBC  Result Value Ref Range   WBC 8.7 4.0 - 10.5 K/uL   RBC 4.03 3.87 - 5.11 MIL/uL   Hemoglobin 12.5 12.0 - 15.0 g/dL   HCT 41.1 36 - 46 %   MCV 102.0 (H) 80.0 - 100.0 fL   MCH 31.0 26.0 - 34.0 pg   MCHC 30.4 30.0 - 36.0 g/dL   RDW 13.2 11.5 - 15.5 %   Platelets 324 150 - 400 K/uL  nRBC 0.0 0.0 - 0.2 %  Urinalysis, Routine w reflex microscopic Urine, Clean Catch  Result Value Ref Range   Color, Urine YELLOW YELLOW   APPearance CLEAR CLEAR   Specific Gravity, Urine 1.011 1.005 - 1.030   pH 7.0 5.0 - 8.0   Glucose, UA NEGATIVE NEGATIVE mg/dL   Hgb urine dipstick NEGATIVE NEGATIVE   Bilirubin Urine NEGATIVE NEGATIVE   Ketones, ur NEGATIVE NEGATIVE mg/dL   Protein, ur NEGATIVE NEGATIVE mg/dL   Nitrite NEGATIVE NEGATIVE   Leukocytes,Ua TRACE (A) NEGATIVE   RBC / HPF 0-5 0 - 5 RBC/hpf   WBC, UA 0-5 0 - 5 WBC/hpf   Bacteria, UA NONE SEEN NONE SEEN   Squamous Epithelial / LPF 0-5 0 - 5   Mucus PRESENT     Discharge Medications:   Allergies as of 10/05/2019       Reactions   Lisinopril Cough   Penicillins Itching   50 years ago   Adhesive [tape] Itching   Atorvastatin Itching   Dilaudid [hydromorphone Hcl] Itching   Hydromorphone Itching        Medication List     TAKE these medications    acetaminophen 500 MG tablet Commonly known as: TYLENOL Take 500 mg by mouth every  6 (six) hours as needed (for pain.).   ALPRAZolam 0.5 MG tablet Commonly known as: XANAX Take 0.5 mg by mouth 2 (two) times daily as needed for sleep.   aspirin EC 81 MG tablet Take 81 mg by mouth daily.   bismuth subsalicylate 637 CH/88FO suspension Commonly known as: PEPTO BISMOL Take 30 mLs by mouth every 6 (six) hours as needed for indigestion.   celecoxib 100 MG capsule Commonly known as: CeleBREX Take 1 capsule (100 mg total) by mouth 2 (two) times daily.   cholecalciferol 25 MCG (1000 UNIT) tablet Commonly known as: VITAMIN D Take 1,000 Units by mouth daily.   clindamycin 300 MG capsule Commonly known as: CLEOCIN Take 1 capsule (300 mg total) by mouth 2 (two) times daily for 1 day.   dorzolamide 2 % ophthalmic solution Commonly known as: TRUSOPT Place 1 drop into both eyes 2 (two) times daily.   latanoprost 0.005 % ophthalmic solution Commonly known as: XALATAN Place 1 drop into both eyes at bedtime.   lithium carbonate 300 MG capsule TAKE 1 CAPSULE TWICE DAILY WITH MEALS. What changed: See the new instructions.   losartan 50 MG tablet Commonly known as: COZAAR Take 50 mg by mouth daily.   METAMUCIL FIBER PO Take 1 packet by mouth daily as needed (Constipation).   multivitamin tablet Take 1 tablet by mouth daily.   omeprazole 20 MG capsule Commonly known as: PRILOSEC Take 20 mg by mouth in the morning and at bedtime.   oxyCODONE 5 MG immediate release tablet Commonly known as: Roxicodone Take 1 tablet (5 mg total) by mouth every 4 (four) hours as needed.   polyethylene glycol powder 17 GM/SCOOP powder Commonly known as: MiraLax Take 8.5-34 g by mouth 2 (two) times daily as needed for moderate constipation or severe constipation. To correct constipation.  Adjust dose over 1-2 months.  Goal = ~1 bowel movement / day What changed:  how much to take when to take this   rOPINIRole 0.5 MG tablet Commonly known as: Requip Take 1 tablet (0.5 mg total) by  mouth 4 (four) times daily -  with meals and at bedtime.        Diagnostic Studies: No results found.  Disposition:  Discharge disposition: [place] Not Defined       Discharge Instructions     Call MD / Call 911   Complete by: As directed    If you experience chest pain or shortness of breath, CALL 911 and be transported to the hospital emergency room.  If you develope a fever above 101 F, pus (white drainage) or increased drainage or redness at the wound, or calf pain, call your surgeon's office.   Constipation Prevention   Complete by: As directed    Drink plenty of fluids.  Prune juice may be helpful.  You may use a stool softener, such as Colace (over the counter) 100 mg twice a day.  Use MiraLax (over the counter) for constipation as needed.   Diet - low sodium heart healthy   Complete by: As directed    Discharge instructions   Complete by: As directed    Remove Ace-wrap tomorrow.  You may shower, dressing is waterproof.  Remove postop dressing as directed on the bandage. Do not take a bath or soak the knee in a tub or pool.  You may weightbear as you can tolerate on the operative leg with a walker.  Continue using the CPM machine 3 times per day for one hour each time, increasing the degrees of range of motion daily.  Use the blue cradle boot under your heel to work on getting your leg straight.  Do NOT put a pillow under your knee.  You will follow-up with Dr. Marlou Sa in the clinic in ~2 weeks at your given appointment date.   Increase activity slowly as tolerated   Complete by: As directed          Dental Antibiotics:  In most cases prophylactic antibiotics for Dental procdeures after total joint surgery are not necessary.  Exceptions are as follows:  1. History of prior total joint infection  2. Severely immunocompromised (Organ Transplant, cancer chemotherapy, Rheumatoid biologic meds such as [place])  3. Poorly controlled diabetes (A1C  &gt; 8.0, blood glucose over 200)  If you have one of these conditions, contact your surgeon for an antibiotic prescription, prior to your dental procedure.   Signed: Donella Stade 10/05/2019, 1:40 PM

## 2019-10-05 NOTE — Anesthesia Postprocedure Evaluation (Signed)
Anesthesia Post Note  Patient: Tracey [name]  Procedure(s) Performed: RIGHT TOTAL KNEE ARTHROPLASTY (Right Knee)     Patient location during evaluation: PACU Anesthesia Type: Spinal and Regional Level of consciousness: oriented and awake and alert Pain management: pain level controlled Vital Signs Assessment: post-procedure vital signs reviewed and stable Respiratory status: spontaneous breathing and respiratory function stable Cardiovascular status: blood pressure returned to baseline and stable Postop Assessment: no headache, no backache, no apparent nausea or vomiting, spinal receding and patient able to bend at knees Anesthetic complications: no   No complications documented.  Last Vitals:  Vitals:   10/05/19 1115 10/05/19 1153  BP: (!) 153/64 140/69  Pulse: (!) 50 (!) 47  Resp: 12 15  Temp:    SpO2: 99% 97%    Last Pain:  Vitals:   10/05/19 1153  TempSrc:   PainSc: 0-No pain                 [name],W. EDMOND

## 2019-10-05 NOTE — Anesthesia Procedure Notes (Signed)
Spinal  Patient location during procedure: OR Start time: 10/05/2019 7:34 AM End time: 10/05/2019 7:38 AM Staffing Performed: anesthesiologist  Anesthesiologist: Roderic Palau, MD Preanesthetic Checklist Completed: patient identified, IV checked, risks and benefits discussed, surgical consent, monitors and equipment checked, pre-op evaluation and timeout performed Spinal Block Patient position: sitting Prep: DuraPrep Patient monitoring: cardiac monitor, continuous pulse ox and blood pressure Approach: midline Location: L3-4 Injection technique: single-shot Needle Needle type: Pencan  Needle gauge: 24 G Needle length: 9 cm Assessment Sensory level: T8 Additional Notes Functioning IV was confirmed and monitors were applied. Sterile prep and drape, including hand hygiene and sterile gloves were used. The patient was positioned and the spine was prepped. The skin was anesthetized with lidocaine.  Free flow of clear CSF was obtained prior to injecting local anesthetic into the CSF.  The spinal needle aspirated freely following injection.  The needle was carefully withdrawn.  The patient tolerated the procedure well.

## 2019-10-05 NOTE — NC FL2 (Signed)
[place] LEVEL OF CARE SCREENING TOOL     IDENTIFICATION  Patient Name: Tracey [name] Birthdate: 12-16-1940 Sex: female Admission Date (Current Location): 10/05/2019  [facility] [place] Number:  Herbalist and Address:  [facility], [place] [address] [place], [place] [address]      Provider Number: [id number]  Attending Physician Name and Address:  Meredith Pel, MD  Relative Name and Phone Number:       Current Level of Care: Hospital Recommended Level of Care: [place] Prior Approval Number:    Date Approved/Denied:   PASRR Number:    Discharge Plan: SNF    Current Diagnoses: Patient Active Problem List   Diagnosis Date Noted   Edema 07/01/2019   Right knee pain 06/17/2019   Hypercalcemia 06/17/2019   Slow transit constipation 05/31/2019   Bradycardia 05/31/2019   History of shingles 04/22/2019   Upper back pain on right side 04/01/2019   DOE (dyspnea on exertion) 03/25/2018   Cough variant asthma 03/24/2018   Parkinsonism ([place]) 11/15/2015   Diplopia 10/16/2015   SBO (small bowel obstruction) ([place]) 09/18/2013   Glaucoma 09/18/2013   GERD (gastroesophageal reflux disease) 09/18/2013   HLD (hyperlipidemia) 02/21/2013   Essential (primary) hypertension 02/21/2013   Barrett esophagus 02/19/2013   Cardiac conduction disorder 03/30/2012   Difficulty hearing 08/20/2011   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 12/05/2010   Allergic rhinitis 09/14/2010   Anxiety state 12/22/2009   Bipolar I disorder, single manic episode, in full remission ([place]) 12/01/2009   Deficiency, 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   History of colon polyps 10/24/2008   Arthritis, degenerative 10/24/2008    Orientation RESPIRATION BLADDER Height & Weight     Self, Time, Situation, Place  Normal Continent Weight: 145 lb (65.8  kg) Height:  5\' 2"  (157.5 cm)  BEHAVIORAL SYMPTOMS/MOOD NEUROLOGICAL BOWEL NUTRITION STATUS      Continent Diet  AMBULATORY STATUS COMMUNICATION OF NEEDS Skin   Extensive Assist Verbally Surgical wounds (Right Knee Incision - Compression Wrap)                       Personal Care Assistance Level of Assistance  Bathing, Dressing, Feeding Bathing Assistance: Limited assistance Feeding assistance: Independent Dressing Assistance: Limited assistance     Functional Limitations Info  Sight, Hearing, Speech Sight Info: Adequate Hearing Info: Adequate Speech Info: Adequate    SPECIAL CARE FACTORS FREQUENCY  PT (By licensed PT), OT (By licensed OT)     PT Frequency: 5x week OT Frequency: 5x week            Contractures Contractures Info: Not present    Additional Factors Info  Code Status, Allergies Code Status Info: Full Code Allergies Info: Lisinopril, Penicillins, Adhesive, Atorvastatin, Dilaudid, Hydromorphone           Current Medications (10/05/2019):  This is the current hospital active medication list Current Facility-Administered Medications  Medication Dose Route Frequency Provider Last Rate Last Admin   acetaminophen (TYLENOL) 325 MG tablet            acetaminophen (TYLENOL) tablet 650 mg  650 mg Oral Once Meredith Pel, MD       bupivacaine liposome (EXPAREL) 1.3 % injection 266 mg  20 mL Infiltration Once Meredith Pel, MD       fentaNYL (SUBLIMAZE) injection 25-50 mcg  25-50 mcg Intravenous Q5 min PRN Ola Spurr,  Gwyndolyn Saxon, MD       lactated ringers infusion   Intravenous Continuous Roderic Palau, MD 10 mL/hr at 10/05/19 0658 New Bag at 10/05/19 0727   oxyCODONE (Oxy IR/ROXICODONE) 5 MG immediate release tablet            povidone-iodine (BETADINE) 7.5 % scrub   Topical Once Magnant, Charles L, PA-C       povidone-iodine 10 % swab 2 application  2 application Topical Once Magnant, Charles L, PA-C       rOPINIRole (REQUIP) tablet  0.5 mg  0.5 mg Oral Once Meredith Pel, MD       tranexamic acid (CYKLOKAPRON) 2,000 mg in sodium chloride 0.9 % 50 mL Topical Application  0,903 mg Topical Once Meredith Pel, MD       tranexamic acid (CYKLOKAPRON) IVPB 1,000 mg  1,000 mg Intravenous To OR Magnant, Gerrianne Scale, PA-C         Discharge Medications: Please see discharge summary for a list of discharge medications.  Relevant Imaging Results:  Relevant Lab Results:   Additional Information SSN [id number]   Barbette Or, [place]

## 2019-10-05 NOTE — Anesthesia Procedure Notes (Addendum)
Procedure Name: MAC Date/Time: 10/05/2019 7:35 AM Performed by: Michele Rockers, CRNA Pre-anesthesia Checklist: Patient identified, Emergency Drugs available, Suction available, Timeout performed and Patient being monitored Patient Re-evaluated:Patient Re-evaluated prior to induction Oxygen Delivery Method: Simple face mask

## 2019-10-05 NOTE — Op Note (Signed)
NAME: Fenoglio, Jissel W. MEDICAL RECORD HB:[id number] ACCOUNT [id number] DATE OF BIRTH:06-24-1940 FACILITY: [facility] LOCATION: [facility] PHYSICIAN:Naveed Humphres Randel Pigg, MD  OPERATIVE REPORT  DATE OF PROCEDURE:  10/05/2019  PREOPERATIVE DIAGNOSIS:  Right knee arthritis.  POSTOPERATIVE DIAGNOSIS:  Right knee arthritis.  PROCEDURE:  Right total knee replacement using [employer]ker Triathlon press fit size 3 femur, 4 tibia, 9 mm polyethylene deep dish insert with 32 mm press-fit 3-peg patella.  SURGEON:  Meredith Pel, MD  ASSISTANT:  Lurena Joiner Magnet PA.  INDICATIONS:  Darcelle is a 79 year old patient with end-stage right knee arthritis, who presents for operative management after explanation of risks and benefits.  PROCEDURE IN DETAIL:  The patient was brought to the operating room where spinal anesthetic was induced.  Preoperative antibiotics administered.  Timeout was called.  Right leg prescrubbed with alcohol and Betadine, allowed to air dry.  prep with  DuraPrep solution and draped in sterile manner.  The patient had about a 10-degree flexion contracture.  The leg was covered with Ioban.  A timeout was called.  Leg was elevated examined with the Esmarch, tourniquet was inflated.  Skin and subcutaneous  tissue were sharply divided for anterior approach to the knee.  Median parapatellar approach was then made and marked with #1 Vicryl suture.  Fat pad partially excised.  Lateral patellofemoral ligament released.  Soft tissue removed from the anterior  distal femur.  Medial soft tissue dissection was performed because of the patient's preoperative varus deformity.  ACL released.  Anterior horn of the lateral meniscus released.  Tricompartmental arthritic changes were present, worse in the medial and  patellofemoral joint.  Next, intramedullary alignment was then used to cut the tibial articular surface with collaterals and posterior neurovascular structures protected.  A 9 mm cut was made off the least  affected lateral tibial plateau, later revised 2  more millimeters.  This gave a cut, which was perpendicular to the mechanical axis.  A 5 degree valgus cut was then made measuring 8 mm off the distal femur.  This allowed a 9 mm spacer block to fit nicely with full extension and good stability of the  collateral ligaments in extension.  Femur was sized to a size 3.  Tibia sized to a size 4.  Anterior, posterior chamfer cuts were made with the collaterals protected.  The tibia was then keel punched and trial reduction was performed.  A 9 mm spacer gave  full extension, slight liftoff which was corrected with more release on the PCL.  The patient had full extension, full flexion with no liftoff and good stability to varus and valgus stress at 0, 30 and 90 degrees.  Patella was then cut down from 21 to  12 mm and a trial patellar button was placed.  The patient had excellent patellar tracking using no thumbs technique.  The same stability parameters maintained.  Final preparations were made on the tibia.  Next, bone quality was excellent.  Decision was  made that press fit was a good option.  Thorough irrigation was performed with irrigating solution followed by 3 minutes of tranexamic acid sponge and IrriSept.  It should be noted that IrriSept was used after the arthrotomy and multiple times during the  case to prevent infection.  After 3 minutes, True components were tapped into position with excellent press fit obtained.  Same stability parameters were maintained.  Irrigation performed.  Tourniquet released.  Bleeding points encountered controlled  using electrocautery.  Median parapatellar approach was closed over a bolster using #  1 Vicryl suture.  Vancomycin powder placed into the joint.  A solution of Marcaine, morphine, clonidine also injected into the joint after the arthrotomy closure.  Then,  the closure was performed using 0 Vicryl suture, 2-0 Vicryl suture and a 3-0 Monocryl.  Steri-Strips, Aquacel  dressing applied.  An Ace wrap and knee immobilizer applied.  The patient tolerated the procedure well without immediate complications.  Luke's  assistance was required at all times for retraction, opening and closing, mobilization of tissue.  His assistance was a medical necessity.  PN/NUANCE  D:10/05/2019 T:10/05/2019 JOB:[id number]

## 2019-10-05 NOTE — Progress Notes (Signed)
[facility] RNCM requested to assist with discharge planning needs for patient having Right total knee arthroplasty as ambulatory/same day discharge with Dr. Marlou Sa today. Patient lives in independent living at [facility] and requested to go back there after surgery. She is aware she will need assistance at home and CM spoke several times to her SW, Raelyn Mora [phone] at [facility] to discuss options. There will be a bed available in the SNF Rehab portion of their facility and this is agreeable to patient and staff. Updated and reviewed all post-op care instructions with SW as well as patient. She will need a CPM for use per Dr. Marlou Sa, and this has been ordered through [facility], who can deliver to facility for use after discharge. Anticipate stay in SNF until patient can return to her independent living apartment on her own. Will receive rehab there at facility through [facility]. Patient will need an FL2 done by SW at hospital on day of surgery to discharge to SNF. Will request assistance with this from hospital transitions of care team and Dr. Marlou Sa is aware of plan. Please call Jamse Arn, RN Case Manager [phone] for questions.

## 2019-10-05 NOTE — Brief Op Note (Signed)
   10/05/2019  9:57 AM  PATIENT:  Tracey [name]  79 y.o. female  PRE-OPERATIVE DIAGNOSIS:  right knee osteoarthritis  POST-OPERATIVE DIAGNOSIS:  right knee osteoarthritis  PROCEDURE:  Procedure(s): RIGHT TOTAL KNEE ARTHROPLASTY  SURGEON:  Surgeon(s): Meredith Pel, MD  ASSISTANT: magnant pa  ANESTHESIA:   spinal  EBL: 75 ml    Total I/O In: 1073.3 [I.V.:900; IV Piggyback:173.3] Out: 1075 [Urine:1000; Blood:75]  BLOOD ADMINISTERED: none  DRAINS: none   LOCAL MEDICATIONS USED: Vancomycin powder Marcaine morphine clonidine e  SPECIMEN:  No Specimen  COUNTS:  YES  TOURNIQUET:   Total Tourniquet Time Documented: Thigh (Right) - 56 minutes Total: Thigh (Right) - 56 minutes   DICTATION: .Other Dictation: Dictation Number [id number]  PLAN OF CARE: Discharge to home after PACU  PATIENT DISPOSITION:  PACU - hemodynamically stable

## 2019-10-05 NOTE — Progress Notes (Signed)
spojke with jessie/ Transitions of Care who is coordinating return to [place] rehab

## 2019-10-06 ENCOUNTER — Encounter (HOSPITAL_COMMUNITY): Payer: Self-pay | Admitting: Orthopedic Surgery

## 2019-10-07 ENCOUNTER — Encounter: Payer: Self-pay | Admitting: Internal Medicine

## 2019-10-07 ENCOUNTER — Non-Acute Institutional Stay (SKILLED_NURSING_FACILITY): Payer: Medicare PPO | Admitting: Internal Medicine

## 2019-10-07 DIAGNOSIS — I10 Essential (primary) hypertension: Secondary | ICD-10-CM | POA: Diagnosis not present

## 2019-10-07 DIAGNOSIS — G20C Parkinsonism, unspecified: Secondary | ICD-10-CM

## 2019-10-07 DIAGNOSIS — F304 Manic episode in full remission: Secondary | ICD-10-CM | POA: Diagnosis not present

## 2019-10-07 DIAGNOSIS — E785 Hyperlipidemia, unspecified: Secondary | ICD-10-CM | POA: Diagnosis not present

## 2019-10-07 DIAGNOSIS — Z96651 Presence of right artificial knee joint: Secondary | ICD-10-CM

## 2019-10-07 DIAGNOSIS — G20 Parkinson's disease: Secondary | ICD-10-CM

## 2019-10-07 NOTE — Progress Notes (Signed)
Provider:  Veleta Miners MD Location:   [facility] [place] Room Number: 30 Place of Service:  SNF (31)  PCP: [name], MD Patient Care Team: [name], MD as PCP - General (Internal Medicine) Marti Sleigh, MD as Consulting Physician (Gynecologic Oncology) Ladell Pier, MD as Consulting Physician (Oncology) Richmond Campbell, MD as Consulting Physician (Gastroenterology) Clent Jacks, MD as Consulting Physician (Ophthalmology) Sabra Heck Precious Haws, MD as Referring Physician (Neurology)  Extended Emergency Contact Information Primary Emergency Contact: [place], [place] Mobile Phone: [phone] Relation: Son  Code Status: Managed Care Goals of Care: Advanced Directive information Advanced Directives 10/07/2019  Does Patient Have a Medical Advance Directive? Yes  Type of Advance Directive [place]  Does patient want to make changes to medical advance directive? No - Patient declined  Copy of [place] in Chart? Yes - validated most recent copy scanned in chart (See row information)  Would patient like information on creating a medical advance directive? -  Pre-existing out of facility DNR order (yellow form or pink MOST form) -      Chief Complaint  Patient presents with   New Admit To SNF    Admission    HPI: Patient is a 79 y.o. female seen today for admission to SNF for therapy  Patient Had Right Total Knee Arthroplasty on 10/05/19  Patient has a history of Parkinson diagnosed due to diplopia and hand tremors. Also has history of arthritis, GIST of Small intestines/p Resection, Bipolar Disorder, Hypertension Also Has h/o Hypercalcemia  Patient underwent total knee arthroplasty by Dr. Marlou Sa on 09 21 and was discharged same day. Her main complaint today continues to be the pain Her other complaint is that she thinks her Parkinson medicine is not working for her and wants to know if the dose can  be increased. Doing well otherwise able to get up and bear weight.  Has already started working with therapy  Past Medical History:  Diagnosis Date   Anxiety    Arthritis    Cardiac conduction disorder 03/30/2012   Overview:  STORY: ETT 03/09/2012 Echo 03/07/2012 normal Dr Einar Gip, bradycardia felt due to glaucoma eye drops   Diverticulosis of colon    GIST (gastrointestinal stroma tumor), malignant, colon ([place])    Heart murmur    History of colon polyps 10/24/2008   Hypertension    Major neurocognitive disorder due to Parkinson's disease, possible ([place])    Tremors possible parkinsons   Manic disorder, single episode, in full remission ([place]) 12/01/2009   Sixth nerve palsy    Past Surgical History:  Procedure Laterality Date   BILATERAL SALPINGOOPHORECTOMY  09/22/2007   Gastrointestinal Stroma Tumor,  Other  1960   GIST Surgery   ILEOCECETOMY  09/22/2007   OVARIAN CYST REMOVAL Right 1967   SMALL INTESTINE SURGERY  09/22/2007   TOTAL KNEE ARTHROPLASTY Right 10/05/2019   Procedure: RIGHT TOTAL KNEE ARTHROPLASTY;  Surgeon: Meredith Pel, MD;  Location: [facility];  Service: Orthopedics;  Laterality: Right;   TOTAL VAGINAL HYSTERECTOMY  1986   Fibroids    reports that she has never smoked. She has never used smokeless tobacco. She reports that she does not drink alcohol and does not use drugs. Social History   Socioeconomic History   Marital status: Widowed    Spouse name: Not on file   Number of children: 2   Years of education: Bachelors   Highest education level: Not on file  Occupational History  Not on file  Tobacco Use   Smoking status: Never Smoker   Smokeless tobacco: Never Used  Vaping Use   Vaping Use: Never used  Substance and Sexual Activity   Alcohol use: No   Drug use: No   Sexual activity: Not Currently    Birth control/protection: Post-menopausal  Other Topics Concern   Not on file  Social History Narrative   Lives at home alone.     Right-handed.   No caffeine use.      Diet: Eats anything, like fruits, vegetables, regular diet      Do you drink/ eat things with caffeine? No      Marital status:  Widowed                             What year were you married ? 1965      Do you live in a house, apartment,assistred living, condo, trailer, etc.)? Apartment      Is it one or more stories?  One Story      How many persons live in your home ? 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  No                            Type & how often       ADVANCED DIRECTIVES (Please bring copies)      Do you have a living will? Yes      Do you have a DNR form? Yes                      If not, do you want to discuss one?       Do you have signed POA?HPOA forms?  Yes               If so, please bring to your appointment      FUNCTIONAL STATUS- To be completed by Spouse / child / Staff       Do you have difficulty bathing or dressing yourself ? No   Do you have difficulty preparing food or eating ? No      Do you have difficulty managing your mediation ? No      Do you have difficulty managing your finances ? No      Do you have difficulty affording your medication ? No      Social Determinants of Radio broadcast assistant Strain:    Difficulty of Paying Living Expenses: Not on file  Food Insecurity:    Worried About [place] in the Last Year: Not on file   Ran Out of Food in the Last Year: Not on file  Transportation Needs:    Lack of Transportation (Medical): Not on file   Lack of Transportation (Non-Medical): Not on file  Physical Activity:    Days of Exercise per Week: Not on file   Minutes of Exercise per Session: Not on file  Stress:    Feeling of Stress : Not on file  Social Connections:    Frequency of Communication with  Friends and Family: Not on file   Frequency of  Social Gatherings with Friends and Family: Not on file   Attends Religious Services: Not on file   Active Member of Clubs or Organizations: Not on file   Attends Archivist Meetings: Not on file   Marital Status: Not on file  Intimate Partner Violence:    Fear of Current or Ex-Partner: Not on file   Emotionally Abused: Not on file   Physically Abused: Not on file   Sexually Abused: Not on file    Functional Status Survey:    Family History  Problem Relation Age of Onset   Congestive Heart Failure Mother    Dementia Mother    Heart disease Father    Diabetes Father    Lung cancer Son     Health Maintenance  Topic Date Due   Hepatitis C Screening  Never done   INFLUENZA VACCINE  08/15/2019   TETANUS/TDAP  08/19/2021   DEXA SCAN  Completed   COVID-19 Vaccine  Completed   PNA vac Low Risk Adult  Completed    Allergies  Allergen Reactions   Lisinopril Cough   Penicillins Itching    50 years ago   Adhesive [Tape] Itching   Atorvastatin Itching   Dilaudid [Hydromorphone Hcl] Itching   Hydromorphone Itching    Allergies as of 10/07/2019      Reactions   Lisinopril Cough   Penicillins Itching   50 years ago   Adhesive [tape] Itching   Atorvastatin Itching   Dilaudid [hydromorphone Hcl] Itching   Hydromorphone Itching      Medication List       Accurate as of October 07, 2019 10:35 AM. If you have any questions, ask your nurse or doctor.        acetaminophen 500 MG tablet Commonly known as: TYLENOL Take 500 mg by mouth every 6 (six) hours as needed (for pain.).   ALPRAZolam 0.5 MG tablet Commonly known as: XANAX Take 1 tablet (0.5 mg total) by mouth 2 (two) times daily as needed for sleep.   aspirin EC 81 MG tablet Take 81 mg by mouth daily.   bismuth subsalicylate 016 WF/09NA suspension Commonly known as: PEPTO BISMOL Take 30 mLs by mouth every 6 (six) hours as  needed for indigestion.   celecoxib 100 MG capsule Commonly known as: CeleBREX Take 1 capsule (100 mg total) by mouth 2 (two) times daily.   cholecalciferol 25 MCG (1000 UNIT) tablet Commonly known as: VITAMIN D Take 1,000 Units by mouth daily.   dorzolamide 2 % ophthalmic solution Commonly known as: TRUSOPT Place 1 drop into both eyes 2 (two) times daily.   latanoprost 0.005 % ophthalmic solution Commonly known as: XALATAN Place 1 drop into both eyes at bedtime.   lithium carbonate 300 MG capsule TAKE 1 CAPSULE TWICE DAILY WITH MEALS. What changed: See the new instructions.   losartan 50 MG tablet Commonly known as: COZAAR Take 50 mg by mouth daily.   METAMUCIL FIBER PO Take 1 packet by mouth daily as needed (Constipation).   multivitamin tablet Take 1 tablet by mouth daily.   omeprazole 20 MG capsule Commonly known as: PRILOSEC Take 20 mg by mouth in the morning and at bedtime.   oxyCODONE 5 MG immediate release tablet Commonly known as: Roxicodone Take 1 tablet (5 mg total) by mouth every 4 (four) hours as needed.   polyethylene glycol powder 17 GM/SCOOP powder Commonly known as: MiraLax Take 8.5-34 g by mouth 2 (two) times daily as needed for moderate constipation or  severe constipation. To correct constipation.  Adjust dose over 1-2 months.  Goal = ~1 bowel movement / day What changed:   how much to take  when to take this   rOPINIRole 0.5 MG tablet Commonly known as: REQUIP Take 0.5 mg by mouth at bedtime.   rOPINIRole 0.5 MG tablet Commonly known as: Requip Take 1 tablet (0.5 mg total) by mouth 4 (four) times daily -  with meals and at bedtime.       Review of Systems  Constitutional: Positive for activity change.  HENT: Negative.   Respiratory: Negative.   Cardiovascular: Negative.   Gastrointestinal: Negative.   Musculoskeletal: Positive for arthralgias, gait problem and myalgias.  Skin: Negative.   Neurological: Positive for tremors and  weakness.  Psychiatric/Behavioral: Negative.   All other systems reviewed and are negative.   Vitals:   10/07/19 1025  BP: 128/72  Pulse: 65  Resp: 20  Temp: (!) 96.4 F (35.8 C)  SpO2: 95%  Weight: 147 lb 14.4 oz (67.1 kg)  Height: 5\' 2"  (1.575 m)   Body mass index is 27.05 kg/m. Physical Exam Constitutional: Oriented to person, place, and time. Well-developed and well-nourished.  HENT:  Head: Normocephalic.  Mouth/Throat: Oropharynx is clear and moist.  Eyes: Pupils are equal, round, and reactive to light.  Neck: Neck supple.  Cardiovascular: Normal rate and normal heart sounds.  No murmur heard. Pulmonary/Chest: Effort normal and breath sounds normal. No respiratory distress. No wheezes. She has no rales.  Abdominal: Soft. Bowel sounds are normal. No distension. There is no tenderness. There is no rebound.  Musculoskeletal: Right knee has Surgical Dressing on Lymphadenopathy: none Neurological: Alert and oriented to person, place, and time.  Skin: Skin is warm and dry.  Psychiatric: Normal mood and affect. Behavior is normal. Thought content normal.   Labs reviewed: Basic Metabolic Panel: Recent Labs    04/05/19 0905 04/05/19 0905 04/06/19 1245 05/28/19 0000 10/01/19 1139  NA 140   < > 139 142 140  K 4.3   < > 4.4 4.6 4.4  CL 109   < > 109 102 109  CO2 23   < > 22 25* 24  GLUCOSE 127*  --  103*  --  91  BUN 16   < > 17 26* 20  CREATININE 0.91  --  0.82 1.0 0.84  CALCIUM 11.4*   < > 10.8* 11.2* 10.9*   < > = values in this interval not displayed.   Liver Function Tests: Recent Labs    04/05/19 0905 04/06/19 1245 05/28/19 0000  AST 19 23 22   ALT 13 18 13   ALKPHOS  --  75 101  BILITOT 0.4 0.5  --   PROT 6.9 6.7  --   ALBUMIN  --  4.0 4.6   No results for input(s): LIPASE, AMYLASE in the last 8760 hours. No results for input(s): AMMONIA in the last 8760 hours. CBC: Recent Labs    04/05/19 0905 04/05/19 0905 04/06/19 1245 05/28/19 0000  10/01/19 1139  WBC 8.2   < > 8.1 8.7 8.7  NEUTROABS 5,666  --  6.0 7,439  --   HGB 12.9   < > 12.4 14.1 12.5  HCT 37.4   < > 39.0 41 41.1  MCV 97.9  --  105.4*  --  102.0*  PLT 297   < > 268 336 324   < > = values in this interval not displayed.   Cardiac Enzymes: No results for input(s): CKTOTAL,  CKMB, CKMBINDEX, TROPONINI in the last 8760 hours. BNP: Invalid input(s): POCBNP No results found for: HGBA1C Lab Results  Component Value Date   TSH 2.94 04/05/2019   No results found for: VITAMINB12 No results found for: FOLATE No results found for: IRON, TIBC, FERRITIN  Imaging and Procedures obtained prior to SNF admission: No results found.  Assessment/Plan Essential (primary) hypertension On Cozaar Parkinsonism, unspecified Parkinsonism type (HCC) Increase the Requip to 1 mg in AM Continue the PM 1mg  dose Continue 0.5 mg Rest of the time Has not tolerated Sinemet before  Needs follow up with Neuroloy  Bipolar I disorder, single manic episode,  On Lithium Has been on this for 40 years Hyperlipidemia, unspecified hyperlipidemia type ? Needs to be on Zocor  Will discuss when patient is more comfortable with her stay History of total right knee replacement Will Schedule Tylenol Also Oxycodone PRN Also on Celebrix Doing well with therapy already Anxiety On Xanax Prn Will continue for now GERD On Prilosec  Family/ staff Communication:   Labs/tests ordered: CMP,CBC

## 2019-10-08 ENCOUNTER — Encounter: Payer: Self-pay | Admitting: Internal Medicine

## 2019-10-08 ENCOUNTER — Non-Acute Institutional Stay (SKILLED_NURSING_FACILITY): Payer: Medicare PPO | Admitting: Internal Medicine

## 2019-10-08 DIAGNOSIS — I10 Essential (primary) hypertension: Secondary | ICD-10-CM | POA: Diagnosis not present

## 2019-10-08 DIAGNOSIS — G20 Parkinson's disease: Secondary | ICD-10-CM

## 2019-10-08 DIAGNOSIS — T148XXA Other injury of unspecified body region, initial encounter: Secondary | ICD-10-CM | POA: Diagnosis not present

## 2019-10-08 DIAGNOSIS — F304 Manic episode in full remission: Secondary | ICD-10-CM | POA: Diagnosis not present

## 2019-10-08 DIAGNOSIS — E785 Hyperlipidemia, unspecified: Secondary | ICD-10-CM

## 2019-10-08 NOTE — Progress Notes (Signed)
Location:   [place] Room Number: 30-A Place of Service:  SNF [phone]) Provider:  Veleta Miners, MD  [name], MD  Patient Care Team: [name], MD as PCP - General (Internal Medicine) Marti Sleigh, MD as Consulting Physician (Gynecologic Oncology) Ladell Pier, MD as Consulting Physician (Oncology) Richmond Campbell, MD as Consulting Physician (Gastroenterology) Clent Jacks, MD as Consulting Physician (Ophthalmology) Jacques Navy, MD as Referring Physician (Neurology)  Extended Emergency Contact Information Primary Emergency Contact: [place], [place] Mobile Phone: [phone] Relation: Son  Code Status:   Goals of care: Advanced Directive information Advanced Directives 10/08/2019  Does Patient Have a Medical Advance Directive? Yes  Type of Advance Directive [place]  Does patient want to make changes to medical advance directive? No - Patient declined  Copy of [place] in Chart? Yes - validated most recent copy scanned in chart (See row information)  Would patient like information on creating a medical advance directive? -  Pre-existing out of facility DNR order (yellow form or pink MOST form) -     Chief Complaint  Patient presents with  . Acute Visit    Inner Thigh Bruise.    HPI:  Pt is a 79 y.o. female seen today for medical management of chronic diseases.    Patient Had Right Total Knee Arthroplasty on 10/05/19  Patient has a history of Parkinson diagnosed due to diplopia and hand tremors. Also has history of arthritis, GIST of Small intestines/p Resection, Bipolar Disorder, Hypertension Also Has h/o Hypercalcemia Patient underwent total knee arthroplasty by Dr. Marlou Sa on 09 21 and was discharged same day.  Nurses noticed a bruise in the inner thigh region on her right leg. They were worried it infected. Patient c/o Pain especially since she is now using CPM  Past Medical  History:  Diagnosis Date  . Anxiety   . Arthritis   . Cardiac conduction disorder 03/30/2012   Overview:  STORY: ETT 03/09/2012 Echo 03/07/2012 normal Dr Einar Gip, bradycardia felt due to glaucoma eye drops  . Diverticulosis of colon   . GIST (gastrointestinal stroma tumor), malignant, colon ([place])   . Heart murmur   . History of colon polyps 10/24/2008  . Hypertension   . Major neurocognitive disorder due to Parkinson's disease, possible ([place])    Tremors possible parkinsons  . Manic disorder, single episode, in full remission ([place]) 12/01/2009  . Sixth nerve palsy    Past Surgical History:  Procedure Laterality Date  . BILATERAL SALPINGOOPHORECTOMY  09/22/2007  . Gastrointestinal Stroma Tumor,  Other  1960   GIST Surgery  . ILEOCECETOMY  09/22/2007  . OVARIAN CYST REMOVAL Right 1967  . SMALL INTESTINE SURGERY  09/22/2007  . TOTAL KNEE ARTHROPLASTY Right 10/05/2019   Procedure: RIGHT TOTAL KNEE ARTHROPLASTY;  Surgeon: Meredith Pel, MD;  Location: [facility];  Service: Orthopedics;  Laterality: Right;  . TOTAL VAGINAL HYSTERECTOMY  1986   Fibroids    Allergies  Allergen Reactions  . Lisinopril Cough  . Penicillins Itching    50 years ago  . Adhesive [Tape] Itching  . Atorvastatin Itching  . Dilaudid [Hydromorphone Hcl] Itching  . Hydromorphone Itching    Allergies as of 10/08/2019      Reactions   Lisinopril Cough   Penicillins Itching   50 years ago   Adhesive [tape] Itching   Atorvastatin Itching   Dilaudid [hydromorphone Hcl] Itching   Hydromorphone Itching      Medication List  Accurate as of October 08, 2019  3:50 PM. If you have any questions, ask your nurse or doctor.        acetaminophen 500 MG tablet Commonly known as: TYLENOL Take 500 mg by mouth in the morning, at noon, in the evening, and at bedtime. What changed: Another medication with the same name was removed. Continue taking this medication, and follow the directions you see here. Changed by:  [name], MD   ALPRAZolam 0.5 MG tablet Commonly known as: XANAX Take 1 tablet (0.5 mg total) by mouth 2 (two) times daily as needed for sleep.   aspirin EC 81 MG tablet Take 81 mg by mouth daily.   bismuth subsalicylate 662 HU/76LY suspension Commonly known as: PEPTO BISMOL Take 30 mLs by mouth every 6 (six) hours as needed for indigestion.   celecoxib 100 MG capsule Commonly known as: CeleBREX Take 1 capsule (100 mg total) by mouth 2 (two) times daily.   cholecalciferol 25 MCG (1000 UNIT) tablet Commonly known as: VITAMIN D Take 1,000 Units by mouth daily.   dorzolamide 2 % ophthalmic solution Commonly known as: TRUSOPT Place 1 drop into both eyes 2 (two) times daily.   latanoprost 0.005 % ophthalmic solution Commonly known as: XALATAN Place 1 drop into both eyes at bedtime.   lithium carbonate 300 MG capsule TAKE 1 CAPSULE TWICE DAILY WITH MEALS.   losartan 50 MG tablet Commonly known as: COZAAR Take 50 mg by mouth daily.   METAMUCIL FIBER PO Take 1 packet by mouth daily as needed (Constipation).   multivitamin tablet Take 1 tablet by mouth daily.   omeprazole 20 MG capsule Commonly known as: PRILOSEC Take 20 mg by mouth in the morning and at bedtime.   oxyCODONE 5 MG immediate release tablet Commonly known as: Roxicodone Take 1 tablet (5 mg total) by mouth every 4 (four) hours as needed.   polyethylene glycol 17 g packet Commonly known as: MIRALAX / GLYCOLAX Take 17 g by mouth daily.   rOPINIRole 0.5 MG tablet Commonly known as: REQUIP Take 0.5 mg by mouth in the morning and at bedtime. What changed: Another medication with the same name was removed. Continue taking this medication, and follow the directions you see here. Changed by: [name], MD   rOPINIRole 1 MG tablet Commonly known as: REQUIP Take 1 mg by mouth at bedtime. What changed: Another medication with the same name was removed. Continue taking this medication, and follow the  directions you see here. Changed by: [name], MD   rOPINIRole 1 MG tablet Commonly known as: REQUIP Take 1 mg by mouth in the morning. What changed: Another medication with the same name was removed. Continue taking this medication, and follow the directions you see here. Changed by: [name], MD       Review of Systems  Constitutional: Negative for activity change.  HENT: Negative.   Respiratory: Negative.   Cardiovascular: Positive for leg swelling.  Gastrointestinal: Negative.   Genitourinary: Negative.   Musculoskeletal: Positive for arthralgias, gait problem and myalgias.  Skin: Positive for rash.  Neurological: Positive for weakness.  Psychiatric/Behavioral: Negative.     Immunization History  Administered Date(s) Administered  . Influenza Split 10/24/2008, 09/26/2009, 10/08/2011, 10/05/2012  . Influenza, High Dose Seasonal PF 11/02/2013, 11/02/2013, 11/03/2017, 10/26/2018  . Influenza,inj,Quad PF,6+ Mos 11/01/2014, 10/11/2015, 10/11/2016  . Influenza-Unspecified 10/11/2016  . Moderna SARS-COVID-2 Vaccination 01/18/2019, 02/15/2019  . Pneumococcal Conjugate-13 08/20/2011  . Pneumococcal Polysaccharide-23 01/14/2005, 06/29/2014  .  Pneumococcal-Unspecified 01/14/2005, 06/29/2014  . Tdap 08/20/2011  . Zoster 10/08/2011  . Zoster Recombinat (Shingrix) 11/09/2018, 12/01/2018, 03/01/2019   Pertinent  Health Maintenance Due  Topic Date Due  . INFLUENZA VACCINE  08/15/2019  . DEXA SCAN  Completed  . PNA vac Low Risk Adult  Completed   Fall Risk  07/01/2019 04/22/2019 04/08/2019  Falls in the past year? 0 0 0  Number falls in past yr: 0 0 0   Functional Status Survey:    Vitals:   10/08/19 1535  BP: 130/70  Pulse: 64  Resp: 20  Temp: 98.4 F (36.9 C)  SpO2: 92%  Weight: 147 lb 14.4 oz (67.1 kg)  Height: 5\' 2"  (1.575 m)   Body mass index is 27.05 kg/m. Physical Exam Vitals reviewed.  Constitutional:      Appearance: Normal appearance.  HENT:       Head: Normocephalic.     Nose: Nose normal.     Mouth/Throat:     Mouth: Mucous membranes are moist.     Pharynx: Oropharynx is clear.  Eyes:     Pupils: Pupils are equal, round, and reactive to light.  Cardiovascular:     Rate and Rhythm: Normal rate.     Pulses: Normal pulses.     Heart sounds: Normal heart sounds.  Pulmonary:     Effort: Pulmonary effort is normal.  Abdominal:     General: Abdomen is flat. Bowel sounds are normal.     Palpations: Abdomen is soft.  Musculoskeletal:        General: No swelling.     Cervical back: Neck supple.  Skin:    Comments: Big Bruise in her right inner thigh area. Not infected. Not tender or warm  Neurological:     General: No focal deficit present.     Mental Status: She is alert.     Labs reviewed: Recent Labs    04/05/19 0905 04/05/19 0905 04/06/19 1245 05/28/19 0000 10/01/19 1139  NA 140   < > 139 142 140  K 4.3   < > 4.4 4.6 4.4  CL 109   < > 109 102 109  CO2 23   < > 22 25* 24  GLUCOSE 127*  --  103*  --  91  BUN 16   < > 17 26* 20  CREATININE 0.91  --  0.82 1.0 0.84  CALCIUM 11.4*   < > 10.8* 11.2* 10.9*   < > = values in this interval not displayed.   Recent Labs    04/05/19 0905 04/06/19 1245 05/28/19 0000  AST 19 23 22   ALT 13 18 13   ALKPHOS  --  75 101  BILITOT 0.4 0.5  --   PROT 6.9 6.7  --   ALBUMIN  --  4.0 4.6   Recent Labs    04/05/19 0905 04/05/19 0905 04/06/19 1245 05/28/19 0000 10/01/19 1139  WBC 8.2   < > 8.1 8.7 8.7  NEUTROABS 5,666  --  6.0 7,439  --   HGB 12.9   < > 12.4 14.1 12.5  HCT 37.4   < > 39.0 41 41.1  MCV 97.9  --  105.4*  --  102.0*  PLT 297   < > 268 336 324   < > = values in this interval not displayed.   Lab Results  Component Value Date   TSH 2.94 04/05/2019   No results found for: HGBA1C Lab Results  Component Value Date   CHOL  284 (H) 04/05/2019   HDL 73 04/05/2019   LDLCALC 173 (H) 04/05/2019   TRIG 225 (H) 04/05/2019   CHOLHDL 3.9 04/05/2019     Significant Diagnostic Results in last 30 days:  No results found.  Assessment/Plan Bruise of Inner thigh Will continue to monitor On aspirin low dose No Change right now  S/P Right Knee arthroplasty Pain Control with tylenol and Oxycodone Other issues  Essential (primary) hypertension On Cozaar Parkinsonism, unspecified Parkinsonism type (HCC) Increase the Requip to 1 mg in AM Continue the PM 1mg  dose Continue 0.5 mg Rest of the time Has not tolerated Sinemet before  Needs follow up with Neuroloy  Bipolar I disorder, single manic episode,  On Lithium Has been on this for 40 years Hyperlipidemia, unspecified hyperlipidemia type  Needs to be on Zocor  Will discuss when patient is more comfortable with her stay History of total right knee replacement  Schedule Tylenol Also Oxycodone PRN Also on Celebrix Doing well with therapy already Anxiety On Xanax Prn Will continue for now GERD On Prilosec      Family/ staff Communication:  Labs/tests ordered:

## 2019-10-12 ENCOUNTER — Other Ambulatory Visit: Payer: Self-pay | Admitting: *Deleted

## 2019-10-12 MED ORDER — OXYCODONE HCL 5 MG PO TABS
5.0000 mg | ORAL_TABLET | ORAL | 0 refills | Status: DC | PRN
Start: 2019-10-12 — End: 2020-01-12

## 2019-10-12 NOTE — Telephone Encounter (Signed)
Received refill Request from [facility] Pended Rx and sent to Dr. Gupta for approval.  

## 2019-10-20 ENCOUNTER — Encounter: Payer: Self-pay | Admitting: Nurse Practitioner

## 2019-10-20 ENCOUNTER — Ambulatory Visit: Payer: Self-pay

## 2019-10-20 ENCOUNTER — Encounter: Payer: Self-pay | Admitting: Orthopedic Surgery

## 2019-10-20 ENCOUNTER — Ambulatory Visit (INDEPENDENT_AMBULATORY_CARE_PROVIDER_SITE_OTHER): Payer: Medicare PPO | Admitting: Orthopedic Surgery

## 2019-10-20 ENCOUNTER — Non-Acute Institutional Stay (SKILLED_NURSING_FACILITY): Payer: Medicare PPO | Admitting: Nurse Practitioner

## 2019-10-20 DIAGNOSIS — K219 Gastro-esophageal reflux disease without esophagitis: Secondary | ICD-10-CM

## 2019-10-20 DIAGNOSIS — C49A3 Gastrointestinal stromal tumor of small intestine: Secondary | ICD-10-CM | POA: Diagnosis not present

## 2019-10-20 DIAGNOSIS — K5901 Slow transit constipation: Secondary | ICD-10-CM | POA: Diagnosis not present

## 2019-10-20 DIAGNOSIS — J45991 Cough variant asthma: Secondary | ICD-10-CM

## 2019-10-20 DIAGNOSIS — I10 Essential (primary) hypertension: Secondary | ICD-10-CM

## 2019-10-20 DIAGNOSIS — R609 Edema, unspecified: Secondary | ICD-10-CM

## 2019-10-20 DIAGNOSIS — M8949 Other hypertrophic osteoarthropathy, multiple sites: Secondary | ICD-10-CM | POA: Diagnosis not present

## 2019-10-20 DIAGNOSIS — G20 Parkinson's disease: Secondary | ICD-10-CM

## 2019-10-20 DIAGNOSIS — M159 Polyosteoarthritis, unspecified: Secondary | ICD-10-CM

## 2019-10-20 DIAGNOSIS — M150 Primary generalized (osteo)arthritis: Secondary | ICD-10-CM

## 2019-10-20 DIAGNOSIS — G20C Parkinsonism, unspecified: Secondary | ICD-10-CM

## 2019-10-20 DIAGNOSIS — R001 Bradycardia, unspecified: Secondary | ICD-10-CM

## 2019-10-20 DIAGNOSIS — F304 Manic episode in full remission: Secondary | ICD-10-CM

## 2019-10-20 DIAGNOSIS — Z96651 Presence of right artificial knee joint: Secondary | ICD-10-CM

## 2019-10-20 NOTE — Assessment & Plan Note (Addendum)
Bipolar disorder, stable, on Lithium 300mg  bid(Lithium keeps her stable.  Since age 79), Alprazolam 0.5MG  BID prn-prn Alprazolam was used almost nightly.

## 2019-10-20 NOTE — Assessment & Plan Note (Signed)
Ca 10.9 10/01/19

## 2019-10-20 NOTE — Assessment & Plan Note (Signed)
RLE, TED, post op

## 2019-10-20 NOTE — Assessment & Plan Note (Signed)
Prn Metamucil, MiraLax qd are effective.  

## 2019-10-20 NOTE — Progress Notes (Signed)
Location:   [facility] Room Number: [place] 30 Place of Service:  SNF (31) SNF Provider:  Marlana Latus, NP  [name], MD  Patient Care Team: [name], MD as PCP - General (Internal Medicine) Marti Sleigh, MD as Consulting Physician (Gynecologic Oncology) Ladell Pier, MD as Consulting Physician (Oncology) Richmond Campbell, MD as Consulting Physician (Gastroenterology) Clent Jacks, MD as Consulting Physician (Ophthalmology) Jacques Navy, MD as Referring Physician (Neurology)  Extended Emergency Contact Information Primary Emergency Contact: Leverne Humbles Address: [address]          [place], [place] [address] [name] of [place] Mobile Phone: [phone] Relation: Son  Code Status:   Goals of care: Advanced Directive information Advanced Directives 10/20/2019  Does Patient Have a Medical Advance Directive? Yes  Type of Advance Directive [place]  Does patient want to make changes to medical advance directive? No - Patient declined  Copy of [place] in Chart? Yes - validated most recent copy scanned in chart (See row information)  Would patient like information on creating a medical advance directive? -  Pre-existing out of facility DNR order (yellow form or pink MOST form) -     Chief Complaint  Patient presents with   Discharge Note    HPI:  Pt is a 79 y.o. female seen today for [place] [facility]    The patient admitted to [facility] for therapy following R total knee arthroplasty 10/05/19, surgical wound is healed, she has regained her physical strength, ADL function, ready to [place] [facility], takes Tylenol 500mg  qid, Celebrex 100mg  bid, prn Oxycodone for OA pain.    Hx of GIST of small intestine s/p resection.prn MiraLax. Daily Psyllium . constipation. Prn Metamucil, MiraLax qd are effective. Hx of Parkinson's, stable(diplopiaR+L  lateral,tremor in fingers), on Requip             GERD, stable, on Omeprazole 20mg  bid             HTN, blood pressure is controlled on Losartan              Bipolar disorder, stable, on Lithium 300mg  bid, Alprazolam 0.5MG  BID prn-prn Alprazolam was used almost nightly.             Bradycardia, improved from 50s from 40s,normalized after Tylenol PM dc'd.              Hypercalcemia, Ca 10.9 10/01/19  Past Medical History:  Diagnosis Date   Anxiety    Arthritis    Cardiac conduction disorder 03/30/2012   Overview:  STORY: ETT 03/09/2012 Echo 03/07/2012 normal Dr Einar Gip, bradycardia felt due to glaucoma eye drops   Diverticulosis of colon    GIST (gastrointestinal stroma tumor), malignant, colon ([place])    Heart murmur    History of colon polyps 10/24/2008   Hypertension    Major neurocognitive disorder due to Parkinson's disease, possible    Tremors possible parkinsons   Manic disorder, single episode, in full remission ([place]) 12/01/2009   Sixth nerve palsy    Past Surgical History:  Procedure Laterality Date   BILATERAL SALPINGOOPHORECTOMY  09/22/2007   Gastrointestinal Stroma Tumor,  Other  1960   GIST Surgery   ILEOCECETOMY  09/22/2007   OVARIAN CYST REMOVAL Right 1967   SMALL INTESTINE SURGERY  09/22/2007   TOTAL KNEE ARTHROPLASTY Right 10/05/2019   Procedure: RIGHT TOTAL KNEE ARTHROPLASTY;  Surgeon: Meredith Pel, MD;  Location: [facility];  Service: Orthopedics;  Laterality: Right;   TOTAL VAGINAL HYSTERECTOMY  1986   Fibroids    Allergies  Allergen Reactions   Lisinopril Cough   Penicillins Itching    50 years ago   Adhesive [Tape] Itching   Atorvastatin Itching   Dilaudid [Hydromorphone Hcl] Itching   Hydromorphone Itching    Allergies as of 10/20/2019      Reactions   Lisinopril Cough   Penicillins Itching   50 years ago   Adhesive [tape] Itching   Atorvastatin Itching   Dilaudid [hydromorphone Hcl] Itching   Hydromorphone Itching       Medication List       Accurate as of October 20, 2019 11:59 PM. If you have any questions, ask your nurse or doctor.        acetaminophen 500 MG tablet Commonly known as: TYLENOL Take 500 mg by mouth in the morning, at noon, in the evening, and at bedtime.   ALPRAZolam 0.5 MG tablet Commonly known as: XANAX Take 1 tablet (0.5 mg total) by mouth 2 (two) times daily as needed for sleep.   aspirin EC 81 MG tablet Take 81 mg by mouth daily.   bismuth subsalicylate 433 IR/51OA suspension Commonly known as: PEPTO BISMOL Take 30 mLs by mouth every 6 (six) hours as needed for indigestion.   celecoxib 100 MG capsule Commonly known as: CeleBREX Take 1 capsule (100 mg total) by mouth 2 (two) times daily.   cholecalciferol 25 MCG (1000 UNIT) tablet Commonly known as: VITAMIN D Take 1,000 Units by mouth daily.   dorzolamide 2 % ophthalmic solution Commonly known as: TRUSOPT Place 1 drop into both eyes 2 (two) times daily.   latanoprost 0.005 % ophthalmic solution Commonly known as: XALATAN Place 1 drop into both eyes at bedtime.   lithium carbonate 300 MG capsule TAKE 1 CAPSULE TWICE DAILY WITH MEALS.   losartan 50 MG tablet Commonly known as: COZAAR Take 50 mg by mouth daily.   METAMUCIL FIBER PO Take 1 packet by mouth daily as needed (Constipation).   multivitamin tablet Take 1 tablet by mouth daily.   omeprazole 20 MG capsule Commonly known as: PRILOSEC Take 20 mg by mouth in the morning and at bedtime.   oxyCODONE 5 MG immediate release tablet Commonly known as: Roxicodone Take 1 tablet (5 mg total) by mouth every 4 (four) hours as needed.   polyethylene glycol 17 g packet Commonly known as: MIRALAX / GLYCOLAX Take 17 g by mouth daily.   rOPINIRole 0.5 MG tablet Commonly known as: REQUIP Take 0.5 mg by mouth in the morning and at bedtime.   rOPINIRole 1 MG tablet Commonly known as: REQUIP Take 1 mg by mouth at bedtime.   rOPINIRole 1 MG tablet Commonly  known as: REQUIP Take 1 mg by mouth in the morning.       Review of Systems  Constitutional: Negative for appetite change, fatigue, fever and unexpected weight change.  HENT: Positive for hearing loss. Negative for congestion and trouble swallowing.   Eyes: Negative for visual disturbance.       Glaucoma, diplopia R+L lateral peripheral visual fields only.   Respiratory: Positive for cough. Negative for shortness of breath.        Occasionally DOE, hacking cough  Cardiovascular: Positive for leg swelling.  Gastrointestinal: Negative for abdominal pain and constipation.  Genitourinary: Negative for dysuria, frequency and urgency.  Musculoskeletal: Positive for arthralgias and gait problem.  Walker. R knee pain is chronic, s/p TKR  Skin: Negative for color change.  Neurological: Positive for tremors. Negative for speech difficulty and light-headedness.       Tremors in hands/fingers at rest. L>R, progressing  Psychiatric/Behavioral: Negative for behavioral problems and sleep disturbance. The patient is not nervous/anxious.     Immunization History  Administered Date(s) Administered   Influenza Split 10/24/2008, 09/26/2009, 10/08/2011, 10/05/2012   Influenza, High Dose Seasonal PF 11/02/2013, 11/02/2013, 11/03/2017, 10/26/2018   Influenza,inj,Quad PF,6+ Mos 11/01/2014, 10/11/2015, 10/11/2016   Influenza-Unspecified 10/11/2016   Moderna SARS-COVID-2 Vaccination 01/18/2019, 02/15/2019   Pneumococcal Conjugate-13 08/20/2011   Pneumococcal Polysaccharide-23 01/14/2005, 06/29/2014   Pneumococcal-Unspecified 01/14/2005, 06/29/2014   Tdap 08/20/2011   Zoster 10/08/2011   Zoster Recombinat (Shingrix) 11/09/2018, 12/01/2018, 03/01/2019   Pertinent  Health Maintenance Due  Topic Date Due   INFLUENZA VACCINE  08/15/2019   DEXA SCAN  Completed   PNA vac Low Risk Adult  Completed   Fall Risk  07/01/2019 04/22/2019 04/08/2019  Falls in the past year? 0 0 0  Number falls in  past yr: 0 0 0   Functional Status Survey:    Vitals:   10/20/19 1144  BP: 132/70  Pulse: 64  Resp: 18  Temp: 98.7 F (37.1 C)  SpO2: 98%  Weight: 149 lb 11.2 oz (67.9 kg)  Height: 5\' 2"  (1.575 m)   Body mass index is 27.38 kg/m. Physical Exam Vitals and nursing note reviewed.  Constitutional:      Appearance: Normal appearance.  HENT:     Head: Normocephalic and atraumatic.     Mouth/Throat:     Mouth: Mucous membranes are moist.  Eyes:     Extraocular Movements: Extraocular movements intact.     Conjunctiva/sclera: Conjunctivae normal.     Pupils: Pupils are equal, round, and reactive to light.  Cardiovascular:     Rate and Rhythm: Normal rate and regular rhythm.     Heart sounds: No murmur heard.   Pulmonary:     Breath sounds: No rales.  Abdominal:     General: Bowel sounds are normal.     Palpations: Abdomen is soft.     Tenderness: There is no abdominal tenderness.  Musculoskeletal:        General: Tenderness present.     Cervical back: Normal range of motion and neck supple.     Right lower leg: Edema present.     Left lower leg: Edema present.     Comments: Chronic R knee pain is improved. S/p TKR right, healed. Mild swelling. RLE edema 1+. Trace edema LLE  Skin:    General: Skin is warm and dry.  Neurological:     General: No focal deficit present.     Mental Status: She is alert and oriented to person, place, and time. Mental status is at baseline.     Motor: No weakness.     Coordination: Coordination abnormal.     Gait: Gait abnormal.     Comments: Resting tremor in fingers, improved.   Psychiatric:        Mood and Affect: Mood normal.        Behavior: Behavior normal.        Thought Content: Thought content normal.        Judgment: Judgment normal.     Labs reviewed: Recent Labs    04/05/19 0905 04/05/19 0905 04/06/19 1245 05/28/19 0000 10/01/19 1139  NA 140   < > 139 142 140  K 4.3   < >  4.4 4.6 4.4  CL 109   < > 109 102 109  CO2  23   < > 22 25* 24  GLUCOSE 127*  --  103*  --  91  BUN 16   < > 17 26* 20  CREATININE 0.91  --  0.82 1.0 0.84  CALCIUM 11.4*   < > 10.8* 11.2* 10.9*   < > = values in this interval not displayed.   Recent Labs    04/05/19 0905 04/06/19 1245 05/28/19 0000  AST 19 23 22   ALT 13 18 13   ALKPHOS  --  75 101  BILITOT 0.4 0.5  --   PROT 6.9 6.7  --   ALBUMIN  --  4.0 4.6   Recent Labs    04/05/19 0905 04/05/19 0905 04/06/19 1245 05/28/19 0000 10/01/19 1139  WBC 8.2   < > 8.1 8.7 8.7  NEUTROABS 5,666  --  6.0 7,439  --   HGB 12.9   < > 12.4 14.1 12.5  HCT 37.4   < > 39.0 41 41.1  MCV 97.9  --  105.4*  --  102.0*  PLT 297   < > 268 336 324   < > = values in this interval not displayed.   Lab Results  Component Value Date   TSH 2.94 04/05/2019   No results found for: HGBA1C Lab Results  Component Value Date   CHOL 284 (H) 04/05/2019   HDL 73 04/05/2019   LDLCALC 173 (H) 04/05/2019   TRIG 225 (H) 04/05/2019   CHOLHDL 3.9 04/05/2019    Significant Diagnostic Results in last 30 days:  XR Knee 1-2 Views Right  Result Date: 10/20/2019 AP lateral right knee reviewed. Press-fit total knee prosthesis in good position alignment with no complicating features.   Assessment/Plan Arthritis, degenerative The patient admitted to [facility] for therapy following R total knee arthroplasty 10/05/19, surgical wound is healed, she has regained her physical strength, ADL function, [place] [facility], takes Tylenol 500mg  qid, Celebrex 100mg  bid, prn Oxycodone for OA pain.    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 Daily Psyllium .   GERD (gastroesophageal reflux disease) Stable, continue Famotidine.   Slow transit constipation Prn Metamucil, MiraLax qd are effective.   Parkinsonism ([place]) Hx of Parkinson's, stable(diplopiaR+L lateral,tremor in  fingers), on Requip   Essential (primary) hypertension Blood pressure is controlled, continue Losartan.   Bipolar I disorder, single manic episode, in full remission Bipolar disorder, stable, on Lithium 300mg  bid(Lithium keeps her stable.  Since age 16), Alprazolam 0.5MG  BID prn-prn Alprazolam was used almost nightly.    Bradycardia Bradycardia, improved from 50s from 40s,normalized after Tylenol PM dc'd.    Edema RLE, TED, post op  Hypercalcemia Ca 10.9 10/01/19     Family/ staff Communication: plan of care reviewed with the patient and charge nurse.   Labs/tests ordered:  none

## 2019-10-20 NOTE — Assessment & Plan Note (Signed)
Stable, continue Famotidine.  

## 2019-10-20 NOTE — Progress Notes (Signed)
Post-Op Visit Note   Patient: Tracey [name]           Date of Birth: Sep 20, 1940           MRN: [id number] Visit Date: 10/20/2019 PCP: [name], MD   Assessment & Plan:  Chief Complaint:  Chief Complaint  Patient presents with  . Right Knee - Routine Post Op   Visit Diagnoses:  1. Status post total right knee replacement     Plan: Tracey [name] is a 79 year old patient is 2 weeks out right total knee replacement. Ambulating with walker. Going back to independent living today. They would like the CPM discontinued because it is hurting her knee. She is at friend's home doing physical therapy. On exam no calf tenderness and the appropriate amount of swelling is present. She is taking aspirin for DVT prophylaxis. Extensor mechanism is intact and she bends fairly easily to 90 degrees. Radiographs look good. Ankle dorsiflexion intact. Plan at this time is continue with therapy to work on range of motion and strengthening. Come back in 4 weeks for clinical recheck.  Follow-Up Instructions: Return in about 4 weeks (around 11/17/2019).   Orders:  Orders Placed This Encounter  Procedures  . XR Knee 1-2 Views Right   No orders of the defined types were placed in this encounter.   Imaging: XR Knee 1-2 Views Right  Result Date: 10/20/2019 AP lateral right knee reviewed. Press-fit total knee prosthesis in good position alignment with no complicating features.   PMFS History: Patient Active Problem List   Diagnosis Date Noted  . Edema 07/01/2019  . Right knee pain 06/17/2019  . Hypercalcemia 06/17/2019  . Slow transit constipation 05/31/2019  . Bradycardia 05/31/2019  . History of shingles 04/22/2019  . Upper back pain on right side 04/01/2019  . DOE (dyspnea on exertion) 03/25/2018  . Cough variant asthma 03/24/2018  . Parkinsonism ([place]) 11/15/2015  . Diplopia 10/16/2015  . SBO (small bowel obstruction) ([place]) 09/18/2013  . Glaucoma 09/18/2013  . GERD (gastroesophageal  reflux disease) 09/18/2013  . HLD (hyperlipidemia) 02/21/2013  . Essential (primary) hypertension 02/21/2013  . Barrett esophagus 02/19/2013  . Cardiac conduction disorder 03/30/2012  . Difficulty hearing 08/20/2011  . 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 Allergic rhinitis 09/14/2010  . Anxiety state 12/22/2009  . Bipolar I disorder, single manic episode, in full remission ([place]) 12/01/2009  . 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 History of colon polyps 10/24/2008  . Arthritis, degenerative 10/24/2008   Past Medical History:  Diagnosis Date  . Anxiety   . Arthritis   . Cardiac conduction disorder 03/30/2012   Overview:  STORY: ETT 03/09/2012 Echo 03/07/2012 normal Dr Einar Gip, bradycardia felt due to glaucoma eye drops  . Diverticulosis of colon   . GIST (gastrointestinal stroma tumor), malignant, colon ([place])   . Heart murmur   . History of colon polyps 10/24/2008  . Hypertension   . Major neurocognitive disorder due to Parkinson's disease, possible    Tremors possible parkinsons  . Manic disorder, single episode, in full remission ([place]) 12/01/2009  . Sixth nerve palsy     Family History  Problem Relation Age of Onset  . Congestive Heart Failure Mother   . Dementia Mother   . Heart disease Father   . Diabetes Father   . Lung cancer Son     Past Surgical History:  Procedure Laterality Date  . BILATERAL SALPINGOOPHORECTOMY  09/22/2007  .  Gastrointestinal Stroma Tumor,  Other  1960   GIST Surgery  . ILEOCECETOMY  09/22/2007  . OVARIAN CYST REMOVAL Right 1967  . SMALL INTESTINE SURGERY  09/22/2007  . TOTAL KNEE ARTHROPLASTY Right 10/05/2019   Procedure: RIGHT TOTAL KNEE ARTHROPLASTY;  Surgeon: Meredith Pel, MD;  Location: [facility];  Service: Orthopedics;  Laterality: Right;  . TOTAL VAGINAL HYSTERECTOMY  1986   Fibroids   Social History   Occupational History  . Not on file  Tobacco  Use  . Smoking status: Never Smoker  . Smokeless tobacco: Never Used  Vaping Use  . Vaping Use: Never used  Substance and Sexual Activity  . Alcohol use: No  . Drug use: No  . Sexual activity: Not Currently    Birth control/protection: Post-menopausal

## 2019-10-20 NOTE — Assessment & Plan Note (Signed)
The patient admitted to [facility] for therapy following R total knee arthroplasty 10/05/19, surgical wound is healed, she has regained her physical strength, ADL function, ready to [place] [facility], takes Tylenol 500mg  qid, Celebrex 100mg  bid, prn Oxycodone for OA pain.

## 2019-10-20 NOTE — Assessment & Plan Note (Signed)
Hx of GIST of small intestine s/p resection. prn MiraLax. Daily Psyllium . 

## 2019-10-20 NOTE — Assessment & Plan Note (Signed)
Blood pressure is controlled, continue Losartan 

## 2019-10-20 NOTE — Assessment & Plan Note (Signed)
Hx of Parkinson's, stable(diplopiaR+L lateral,tremor in fingers), on Requip

## 2019-10-20 NOTE — Assessment & Plan Note (Signed)
Bradycardia, improved from 50s from 40s,normalized after Tylenol PM dc'd.

## 2019-10-21 ENCOUNTER — Encounter: Payer: Self-pay | Admitting: Nurse Practitioner

## 2019-11-11 ENCOUNTER — Other Ambulatory Visit: Payer: Self-pay | Admitting: Nurse Practitioner

## 2019-11-15 ENCOUNTER — Other Ambulatory Visit: Payer: Self-pay | Admitting: Nurse Practitioner

## 2019-11-19 ENCOUNTER — Ambulatory Visit (INDEPENDENT_AMBULATORY_CARE_PROVIDER_SITE_OTHER): Payer: Medicare PPO | Admitting: Orthopedic Surgery

## 2019-11-19 ENCOUNTER — Encounter: Payer: Self-pay | Admitting: Orthopedic Surgery

## 2019-11-19 VITALS — Ht 62.0 in | Wt 149.0 lb

## 2019-11-19 DIAGNOSIS — Z96651 Presence of right artificial knee joint: Secondary | ICD-10-CM

## 2019-11-19 NOTE — Progress Notes (Signed)
Post-Op Visit Note   Patient: Tracey [name]           Date of Birth: Jun 07, 1940           MRN: [id number] Visit Date: 11/19/2019 PCP: [name], MD   Assessment & Plan:  Chief Complaint:  Chief Complaint  Patient presents with  . Right Knee - Follow-up    10/05/2019 right TKA   Visit Diagnoses:  1. Status post total right knee replacement     Plan: Patient is a 79 year old female presents s/p right total knee arthroplasty on 10/05/2019.  She is doing well and continues to improve.  She ambulates with a walker though she does not use her walker when she is moving around her own living space.  She notes twisting with her knee causes pain.  She is at friend's home and she states that physical therapy is going very well.  She only has to take Tylenol as needed and it provides good relief.  She really only uses her walker when she is walking long distance.  On exam she has well-healing incision with no evidence of dehiscence or infection.  No calf tenderness.  Negative Homans' sign.  She has excellent flexion with 120 degrees.  Her extension is a little lacking with extension to about 5 to 10 degrees.  Strongly encourage patient to work on her extension every single day for at least an hour.  She states she is really only been working on getting the leg straight about twice per week.  She does have excellent patellar range of motion and a soft endpoint with extension so she still has time to improve this.  Plan to follow-up in 6 weeks for final check with Dr. Marlou Sa.  Follow-Up Instructions: No follow-ups on file.   Orders:  No orders of the defined types were placed in this encounter.  No orders of the defined types were placed in this encounter.   Imaging: No results found.  PMFS History: Patient Active Problem List   Diagnosis Date Noted  . Edema 07/01/2019  . Right knee pain 06/17/2019  . Hypercalcemia 06/17/2019  . Slow transit constipation 05/31/2019  . Bradycardia  05/31/2019  . History of shingles 04/22/2019  . Upper back pain on right side 04/01/2019  . DOE (dyspnea on exertion) 03/25/2018  . Cough variant asthma 03/24/2018  . Parkinsonism ([place]) 11/15/2015  . Diplopia 10/16/2015  . SBO (small bowel obstruction) ([place]) 09/18/2013  . Glaucoma 09/18/2013  . GERD (gastroesophageal reflux disease) 09/18/2013  . HLD (hyperlipidemia) 02/21/2013  . Essential (primary) hypertension 02/21/2013  . Barrett esophagus 02/19/2013  . Cardiac conduction disorder 03/30/2012  . Difficulty hearing 08/20/2011  . 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 Allergic rhinitis 09/14/2010  . Anxiety state 12/22/2009  . Bipolar I disorder, single manic episode, in full remission ([place]) 12/01/2009  . 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 History of colon polyps 10/24/2008  . Arthritis, degenerative 10/24/2008   Past Medical History:  Diagnosis Date  . Anxiety   . Arthritis   . Cardiac conduction disorder 03/30/2012   Overview:  STORY: ETT 03/09/2012 Echo 03/07/2012 normal Dr Einar Gip, bradycardia felt due to glaucoma eye drops  . Diverticulosis of colon   . GIST (gastrointestinal stroma tumor), malignant, colon ([place])   . Heart murmur   . History of colon polyps 10/24/2008  . Hypertension   . Major neurocognitive disorder due to Parkinson's disease,  possible    Tremors possible parkinsons  . Manic disorder, single episode, in full remission ([place]) 12/01/2009  . Sixth nerve palsy     Family History  Problem Relation Age of Onset  . Congestive Heart Failure Mother   . Dementia Mother   . Heart disease Father   . Diabetes Father   . Lung cancer Son     Past Surgical History:  Procedure Laterality Date  . BILATERAL SALPINGOOPHORECTOMY  09/22/2007  . Gastrointestinal Stroma Tumor,  Other  1960   GIST Surgery  . ILEOCECETOMY  09/22/2007  . OVARIAN CYST REMOVAL Right 1967  . SMALL  INTESTINE SURGERY  09/22/2007  . TOTAL KNEE ARTHROPLASTY Right 10/05/2019   Procedure: RIGHT TOTAL KNEE ARTHROPLASTY;  Surgeon: Meredith Pel, MD;  Location: [facility];  Service: Orthopedics;  Laterality: Right;  . TOTAL VAGINAL HYSTERECTOMY  1986   Fibroids   Social History   Occupational History  . Not on file  Tobacco Use  . Smoking status: Never Smoker  . Smokeless tobacco: Never Used  Vaping Use  . Vaping Use: Never used  Substance and Sexual Activity  . Alcohol use: No  . Drug use: No  . Sexual activity: Not Currently    Birth control/protection: Post-menopausal

## 2019-12-07 ENCOUNTER — Other Ambulatory Visit: Payer: Self-pay | Admitting: Nurse Practitioner

## 2019-12-07 NOTE — Telephone Encounter (Signed)
She is taking it wrong. She is suppose to be taking 1 mg BID and then 0.5 mg 2 times  between those time

## 2019-12-07 NOTE — Telephone Encounter (Signed)
I called the patient to see how she is taking this medication she said she is taking it five times a day in her chart it says 1 mg morning and bed time and 1 mg in the morning and 0.5 mg morning and bedtime and the refill request says 0.5 mg four times a day with meals and bedtime  Please Advise

## 2019-12-07 NOTE — Telephone Encounter (Signed)
To assure that we understand how you want  patient is to take this medication:   1 mg in the morning  0.5 mg at lunch  0.5 mg at dinner  1 mg at bedtime  Please confirm, the way this is currently listed on patients medication list is confusing. The patient is currently taking medication 5 times daily as Gay Filler previously stated which indicate she is confused as well.   Thanks

## 2019-12-08 NOTE — Telephone Encounter (Signed)
[place] Patient aware of instructions and we had to go over them 6 times to confirm that she understood how to take. Patient states this is very confusing.    I updated medication list to reflect Dr.Gupta's instructions of requip 1 mg in the morning and at bedtime, 0.5 mg at lunch and dinner.  Patient aware Dr.Gupta had approved a RX that has a different set of instructions yet she is to take as I have discussed with her during this call. Patient instructed to call if she has questions.

## 2019-12-08 NOTE — Telephone Encounter (Signed)
I saw that. She is confused and not doing it right. Her dose was changed when she was admitted in SNF at her request. Can some one call her to tell take it  this way if she would prefer or other option is she takes 0.5 mg 4 times a day. Not 5 times. ? I will go ahead and approve these orders. That way she will have enough to last her.

## 2019-12-22 ENCOUNTER — Other Ambulatory Visit: Payer: Self-pay | Admitting: Internal Medicine

## 2019-12-22 NOTE — Telephone Encounter (Signed)
Patient is requesting refill on medication "Ropinirole 0.5mg ". I see that medication was added to list but I don't see medication being prescribed by PCP [name], MD . Medication pend and sent to provider. Please Advise.

## 2019-12-28 ENCOUNTER — Telehealth: Payer: Self-pay | Admitting: *Deleted

## 2019-12-28 NOTE — Telephone Encounter (Signed)
Received fax from [facility] requesting new Rx for patient's Ropinirole .5mg .   Stated that patient states directions have changed to: Take 2 tablets in the morning, One tablet at Lunch, One tablet at dinner and 2 tablet at bedtime.   The two directions we have in Current Medication list are different and are 2 different doses.  Please Advise.

## 2019-12-30 NOTE — Telephone Encounter (Signed)
Discussed with pharmacy. Verbal given.

## 2019-12-30 NOTE — Telephone Encounter (Signed)
[facility] has sent 2nd fax to confirm medication.  Please Advise.

## 2019-12-31 ENCOUNTER — Other Ambulatory Visit: Payer: Self-pay

## 2019-12-31 ENCOUNTER — Ambulatory Visit (INDEPENDENT_AMBULATORY_CARE_PROVIDER_SITE_OTHER): Payer: Medicare PPO | Admitting: Orthopedic Surgery

## 2019-12-31 DIAGNOSIS — Z96651 Presence of right artificial knee joint: Secondary | ICD-10-CM

## 2020-01-02 ENCOUNTER — Encounter: Payer: Self-pay | Admitting: Orthopedic Surgery

## 2020-01-02 NOTE — Progress Notes (Signed)
Post-Op Visit Note   Patient: Tracey [name]           Date of Birth: [birth year]           MRN: [id number] Visit Date: 12/31/2019 PCP: [name], MD   Assessment & Plan:  Chief Complaint:  Chief Complaint  Patient presents with  . Right Knee - Routine Post Op   Visit Diagnoses:  1. Status post total right knee replacement     Plan: Tracey [name] is a 79 year old patient who is now about 3 months out right total knee replacement.  Ambulating with walker.  Doing well.  Therapy wants to discharge her.  Her balance is off some.  We will continue with therapy just to work on balance.  She is back in independent living and walking better.  Has an occasional twinge but overall doing well.  On exam incision intact.  No effusion in the knee.  Collaterals are stable.  Extensor mechanism intact.  Range of motion about 0-1 15.  Negative Homans no calf tenderness.  Overall Tracey [name] doing very well.  Much better than I expected for someone in her age group.  Continue with therapy for balance for 4 weeks and then follow-up as needed.  Follow-Up Instructions: Return if symptoms worsen or fail to improve.   Orders:  No orders of the defined types were placed in this encounter.  No orders of the defined types were placed in this encounter.   Imaging: No results found.  PMFS History: Patient Active Problem List   Diagnosis Date Noted  . Edema 07/01/2019  . Right knee pain 06/17/2019  . Hypercalcemia 06/17/2019  . Slow transit constipation 05/31/2019  . Bradycardia 05/31/2019  . History of shingles 04/22/2019  . Upper back pain on right side 04/01/2019  . DOE (dyspnea on exertion) 03/25/2018  . Cough variant asthma 03/24/2018  . Parkinsonism ([place]) 11/15/2015  . Diplopia 10/16/2015  . SBO (small bowel obstruction) ([place]) 09/18/2013  . Glaucoma 09/18/2013  . GERD (gastroesophageal reflux disease) 09/18/2013  . HLD (hyperlipidemia) 02/21/2013  . Essential (primary) hypertension  02/21/2013  . Barrett esophagus 02/19/2013  . Cardiac conduction disorder 03/30/2012  . Difficulty hearing 08/20/2011  . 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 Allergic rhinitis 09/14/2010  . Anxiety state 12/22/2009  . Bipolar I disorder, single manic episode, in full remission ([place]) 12/01/2009  . 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 History of colon polyps 10/24/2008  . Arthritis, degenerative 10/24/2008   Past Medical History:  Diagnosis Date  . Anxiety   . Arthritis   . Cardiac conduction disorder 03/30/2012   Overview:  STORY: ETT 03/09/2012 Echo 03/07/2012 normal Dr Einar Gip, bradycardia felt due to glaucoma eye drops  . Diverticulosis of colon   . GIST (gastrointestinal stroma tumor), malignant, colon ([place])   . Heart murmur   . History of colon polyps 10/24/2008  . Hypertension   . Major neurocognitive disorder due to Parkinson's disease, possible    Tremors possible parkinsons  . Manic disorder, single episode, in full remission ([place]) 12/01/2009  . Sixth nerve palsy     Family History  Problem Relation Age of Onset  . Congestive Heart Failure Mother   . Dementia Mother   . Heart disease Father   . Diabetes Father   . Lung cancer Son     Past Surgical History:  Procedure Laterality Date  . BILATERAL SALPINGOOPHORECTOMY  09/22/2007  . Gastrointestinal  Stroma Tumor,  Other  1960   GIST Surgery  . ILEOCECETOMY  09/22/2007  . OVARIAN CYST REMOVAL Right 1967  . SMALL INTESTINE SURGERY  09/22/2007  . TOTAL KNEE ARTHROPLASTY Right 10/05/2019   Procedure: RIGHT TOTAL KNEE ARTHROPLASTY;  Surgeon: Meredith Pel, MD;  Location: [facility];  Service: Orthopedics;  Laterality: Right;  . TOTAL VAGINAL HYSTERECTOMY  1986   Fibroids   Social History   Occupational History  . Not on file  Tobacco Use  . Smoking status: Never Smoker  . Smokeless tobacco: Never Used  Vaping Use  . Vaping Use:  Never used  Substance and Sexual Activity  . Alcohol use: No  . Drug use: No  . Sexual activity: Not Currently    Birth control/protection: Post-menopausal

## 2020-01-04 ENCOUNTER — Other Ambulatory Visit: Payer: Self-pay | Admitting: Internal Medicine

## 2020-01-04 NOTE — Telephone Encounter (Signed)
Requested rx last filled on 11/18/2019  No treatment agreement on file  Last OV with ManX Mast, NP on 10/20/2019, no pending appointment.  I will send rx to ManX to approve and to advise when patient is to be seen again. Once we know when patient is to follow-up we can call her and schedule appointment and make a note to update treatment agreement.

## 2020-01-12 ENCOUNTER — Other Ambulatory Visit: Payer: Self-pay

## 2020-01-12 ENCOUNTER — Non-Acute Institutional Stay (SKILLED_NURSING_FACILITY): Payer: Medicare PPO | Admitting: Nurse Practitioner

## 2020-01-12 ENCOUNTER — Encounter: Payer: Self-pay | Admitting: Nurse Practitioner

## 2020-01-12 ENCOUNTER — Inpatient Hospital Stay (HOSPITAL_COMMUNITY)
Admission: EM | Admit: 2020-01-12 | Discharge: 2020-01-18 | DRG: 057 | Disposition: A | Discharge status: Skilled Nursing Facility | Payer: Medicare Other | Source: Skilled Nursing Facility | Attending: Internal Medicine | Admitting: Internal Medicine

## 2020-01-12 ENCOUNTER — Emergency Department (HOSPITAL_COMMUNITY): Payer: Medicare Other

## 2020-01-12 DIAGNOSIS — R519 Headache, unspecified: Secondary | ICD-10-CM

## 2020-01-12 DIAGNOSIS — R410 Disorientation, unspecified: Secondary | ICD-10-CM | POA: Diagnosis not present

## 2020-01-12 DIAGNOSIS — R001 Bradycardia, unspecified: Secondary | ICD-10-CM | POA: Diagnosis not present

## 2020-01-12 DIAGNOSIS — K5901 Slow transit constipation: Secondary | ICD-10-CM | POA: Diagnosis not present

## 2020-01-12 DIAGNOSIS — Z801 Family history of malignant neoplasm of trachea, bronchus and lung: Secondary | ICD-10-CM

## 2020-01-12 DIAGNOSIS — G20 Parkinson's disease: Secondary | ICD-10-CM | POA: Diagnosis not present

## 2020-01-12 DIAGNOSIS — M25561 Pain in right knee: Secondary | ICD-10-CM

## 2020-01-12 DIAGNOSIS — Z880 Allergy status to penicillin: Secondary | ICD-10-CM

## 2020-01-12 DIAGNOSIS — K219 Gastro-esophageal reflux disease without esophagitis: Secondary | ICD-10-CM

## 2020-01-12 DIAGNOSIS — Z96651 Presence of right artificial knee joint: Secondary | ICD-10-CM | POA: Diagnosis not present

## 2020-01-12 DIAGNOSIS — F419 Anxiety disorder, unspecified: Secondary | ICD-10-CM | POA: Diagnosis not present

## 2020-01-12 DIAGNOSIS — I10 Essential (primary) hypertension: Secondary | ICD-10-CM

## 2020-01-12 DIAGNOSIS — Z66 Do not resuscitate: Secondary | ICD-10-CM | POA: Diagnosis not present

## 2020-01-12 DIAGNOSIS — G20C Parkinsonism, unspecified: Secondary | ICD-10-CM | POA: Diagnosis present

## 2020-01-12 DIAGNOSIS — Z20822 Contact with and (suspected) exposure to covid-19: Secondary | ICD-10-CM | POA: Diagnosis not present

## 2020-01-12 DIAGNOSIS — Z885 Allergy status to narcotic agent status: Secondary | ICD-10-CM

## 2020-01-12 DIAGNOSIS — Z79899 Other long term (current) drug therapy: Secondary | ICD-10-CM

## 2020-01-12 DIAGNOSIS — F3174 Bipolar disorder, in full remission, most recent episode manic: Secondary | ICD-10-CM | POA: Diagnosis present

## 2020-01-12 DIAGNOSIS — E785 Hyperlipidemia, unspecified: Secondary | ICD-10-CM | POA: Diagnosis not present

## 2020-01-12 DIAGNOSIS — G20A1 Parkinson's disease without dyskinesia, without mention of fluctuations: Secondary | ICD-10-CM | POA: Diagnosis present

## 2020-01-12 DIAGNOSIS — Z90710 Acquired absence of both cervix and uterus: Secondary | ICD-10-CM

## 2020-01-12 DIAGNOSIS — L03115 Cellulitis of right lower limb: Secondary | ICD-10-CM | POA: Diagnosis present

## 2020-01-12 DIAGNOSIS — R4182 Altered mental status, unspecified: Secondary | ICD-10-CM | POA: Diagnosis present

## 2020-01-12 DIAGNOSIS — Z86010 Personal history of colonic polyps: Secondary | ICD-10-CM

## 2020-01-12 DIAGNOSIS — Z8249 Family history of ischemic heart disease and other diseases of the circulatory system: Secondary | ICD-10-CM

## 2020-01-12 DIAGNOSIS — Z85831 Personal history of malignant neoplasm of soft tissue: Secondary | ICD-10-CM | POA: Diagnosis not present

## 2020-01-12 DIAGNOSIS — C49A3 Gastrointestinal stromal tumor of small intestine: Secondary | ICD-10-CM

## 2020-01-12 DIAGNOSIS — Z90722 Acquired absence of ovaries, bilateral: Secondary | ICD-10-CM | POA: Diagnosis not present

## 2020-01-12 DIAGNOSIS — H532 Diplopia: Secondary | ICD-10-CM | POA: Diagnosis not present

## 2020-01-12 DIAGNOSIS — Z833 Family history of diabetes mellitus: Secondary | ICD-10-CM

## 2020-01-12 DIAGNOSIS — G8929 Other chronic pain: Secondary | ICD-10-CM

## 2020-01-12 DIAGNOSIS — Z888 Allergy status to other drugs, medicaments and biological substances status: Secondary | ICD-10-CM

## 2020-01-12 DIAGNOSIS — F304 Manic episode in full remission: Secondary | ICD-10-CM

## 2020-01-12 DIAGNOSIS — R479 Unspecified speech disturbances: Secondary | ICD-10-CM | POA: Diagnosis present

## 2020-01-12 LAB — COMPREHENSIVE METABOLIC PANEL
ALT: 23 U/L (ref 0–44)
AST: 43 U/L — ABNORMAL HIGH (ref 15–41)
Albumin: 4.2 g/dL (ref 3.5–5.0)
Alkaline Phosphatase: 85 U/L (ref 38–126)
Anion gap: 7 (ref 5–15)
BUN: 18 mg/dL (ref 8–23)
CO2: 22 mmol/L (ref 22–32)
Calcium: 10.8 mg/dL — ABNORMAL HIGH (ref 8.9–10.3)
Chloride: 112 mmol/L — ABNORMAL HIGH (ref 98–111)
Creatinine, Ser: 1.13 mg/dL — ABNORMAL HIGH (ref 0.44–1.00)
GFR, Estimated: 49 mL/min — ABNORMAL LOW (ref >60)
Glucose, Bld: 105 mg/dL — ABNORMAL HIGH (ref 70–99)
Potassium: 4.9 mmol/L (ref 3.5–5.1)
Sodium: 141 mmol/L (ref 135–145)
Total Bilirubin: 0.8 mg/dL (ref 0.3–1.2)
Total Protein: 7.2 g/dL (ref 6.5–8.1)

## 2020-01-12 LAB — URINALYSIS, ROUTINE W REFLEX MICROSCOPIC
Bilirubin Urine: NEGATIVE
Glucose, UA: NEGATIVE mg/dL
Hgb urine dipstick: NEGATIVE
Ketones, ur: NEGATIVE mg/dL
Leukocytes,Ua: NEGATIVE
Nitrite: NEGATIVE
Protein, ur: NEGATIVE mg/dL
Specific Gravity, Urine: 1.012 (ref 1.005–1.030)
pH: 6 (ref 5.0–8.0)

## 2020-01-12 LAB — CBC
HCT: 42.2 % (ref 36.0–46.0)
Hemoglobin: 13.1 g/dL (ref 12.0–15.0)
MCH: 31.3 pg (ref 26.0–34.0)
MCHC: 31 g/dL (ref 30.0–36.0)
MCV: 100.7 fL — ABNORMAL HIGH (ref 80.0–100.0)
Platelets: 326 10*3/uL (ref 150–400)
RBC: 4.19 MIL/uL (ref 3.87–5.11)
RDW: 13.2 % (ref 11.5–15.5)
WBC: 10.2 10*3/uL (ref 4.0–10.5)
nRBC: 0 % (ref 0.0–0.2)

## 2020-01-12 LAB — RESP PANEL BY RT-PCR (FLU A&B, COVID) ARPGX2
Influenza A by PCR: NEGATIVE
Influenza B by PCR: NEGATIVE
SARS Coronavirus 2 by RT PCR: NEGATIVE

## 2020-01-12 LAB — TSH: TSH: 2.734 u[IU]/mL (ref 0.350–4.500)

## 2020-01-12 LAB — CBG MONITORING, ED: Glucose-Capillary: 103 mg/dL — ABNORMAL HIGH (ref 70–99)

## 2020-01-12 LAB — VITAMIN B12: Vitamin B-12: 291 pg/mL (ref 180–914)

## 2020-01-12 LAB — C-REACTIVE PROTEIN: CRP: 0.6 mg/dL (ref <1.0)

## 2020-01-12 LAB — LITHIUM LEVEL: Lithium Lvl: 1 mmol/L (ref 0.60–1.20)

## 2020-01-12 MED ORDER — ACETAMINOPHEN 650 MG RE SUPP: 650.0000 mg | Freq: Four times a day (QID) | RECTAL | Status: DC | PRN

## 2020-01-12 MED ORDER — PROCHLORPERAZINE EDISYLATE 10 MG/2ML IJ SOLN
5.0000 mg | Freq: Once | INTRAMUSCULAR | Status: AC
  Administered 2020-01-12: 17:00:00 5 mg via INTRAVENOUS
  Filled 2020-01-12: qty 2

## 2020-01-12 MED ORDER — ASPIRIN EC 81 MG PO TBEC
81.0000 mg | DELAYED_RELEASE_TABLET | Freq: Every day | ORAL | Status: DC
  Administered 2020-01-13 – 2020-01-18 (×6): 81 mg via ORAL
  Filled 2020-01-12 (×6): qty 1

## 2020-01-12 MED ORDER — ONDANSETRON HCL 4 MG PO TABS: 4.0000 mg | ORAL_TABLET | Freq: Four times a day (QID) | ORAL | Status: DC | PRN

## 2020-01-12 MED ORDER — ACETAMINOPHEN 325 MG PO TABS: 650.0000 mg | ORAL_TABLET | Freq: Four times a day (QID) | ORAL | Status: DC | PRN

## 2020-01-12 MED ORDER — KETOROLAC TROMETHAMINE 15 MG/ML IJ SOLN
10.0000 mg | Freq: Once | INTRAMUSCULAR | Status: AC
  Administered 2020-01-12: 17:00:00 10 mg via INTRAVENOUS
  Filled 2020-01-12: qty 1

## 2020-01-12 MED ORDER — SODIUM CHLORIDE 0.9 % IV SOLN: INTRAVENOUS | Status: AC

## 2020-01-12 MED ORDER — ALPRAZOLAM 0.5 MG PO TABS
0.5000 mg | ORAL_TABLET | Freq: Two times a day (BID) | ORAL | Status: DC | PRN
  Administered 2020-01-17: 0.5 mg via ORAL
  Filled 2020-01-12: qty 1

## 2020-01-12 MED ORDER — ENOXAPARIN SODIUM 40 MG/0.4ML ~~LOC~~ SOLN
40.0000 mg | SUBCUTANEOUS | Status: DC
  Administered 2020-01-12 – 2020-01-17 (×6): 40 mg via SUBCUTANEOUS
  Filled 2020-01-12 (×6): qty 0.4

## 2020-01-12 MED ORDER — SODIUM CHLORIDE 0.9% FLUSH: 3.0000 mL | Freq: Two times a day (BID) | INTRAVENOUS | Status: DC

## 2020-01-12 MED ORDER — LITHIUM CARBONATE 300 MG PO CAPS
300.0000 mg | ORAL_CAPSULE | Freq: Two times a day (BID) | ORAL | Status: DC
  Administered 2020-01-13 – 2020-01-18 (×10): 300 mg via ORAL
  Filled 2020-01-12 (×11): qty 1

## 2020-01-12 MED ORDER — FAMOTIDINE 20 MG PO TABS
20.0000 mg | ORAL_TABLET | Freq: Once | ORAL | Status: AC
  Administered 2020-01-12: 17:00:00 20 mg via ORAL
  Filled 2020-01-12: qty 1

## 2020-01-12 MED ORDER — ACETAMINOPHEN 325 MG PO TABS
650.0000 mg | ORAL_TABLET | Freq: Once | ORAL | Status: AC
  Administered 2020-01-12: 17:00:00 650 mg via ORAL
  Filled 2020-01-12: qty 2

## 2020-01-12 MED ORDER — ONDANSETRON HCL 4 MG/2ML IJ SOLN: 4.0000 mg | Freq: Four times a day (QID) | INTRAMUSCULAR | Status: DC | PRN

## 2020-01-12 MED ORDER — ACETAMINOPHEN 325 MG PO TABS
650.0000 mg | ORAL_TABLET | Freq: Four times a day (QID) | ORAL | Status: DC | PRN
  Administered 2020-01-13 – 2020-01-18 (×8): 650 mg via ORAL
  Filled 2020-01-12 (×9): qty 2

## 2020-01-12 NOTE — Assessment & Plan Note (Signed)
GERD, stable, on Omeprazole 20mg bid  

## 2020-01-12 NOTE — Assessment & Plan Note (Signed)
Bipolar disorder, stable, on Lithium 300mg  bid, Alprazolam 0.5MG  BID prn

## 2020-01-12 NOTE — Assessment & Plan Note (Signed)
Bradycardia, improved from 50s from 40s,normalized after Tylenol PM dc'd.

## 2020-01-12 NOTE — Assessment & Plan Note (Signed)
Constipation. Prn Metamucil, MiraLaxqdare effective. 

## 2020-01-12 NOTE — ED Provider Notes (Signed)
[facility]-EMERGENCY DEPT Provider Note   CSN: [id number] Arrival date & time: 01/12/20  1524     History Chief Complaint  Patient presents with  . Headache  . Altered Mental Status    Tracey [name] is a 79 y.o. female.  Patient presents with chief concern of progressive Parkinson's disease, tremors diffusely, intermittent headache which has been ongoing for the past 3 to 4 weeks.  Described as a throbbing headache in the posterior region.  She is been taking Tylenol 324 mg several times a day without improvement.  She states the headache comes and goes at times.  Currently espouses a headache in the posterior head region.  Denies falls or other injuries.  No fever no cough no vomiting no diarrhea.        Past Medical History:  Diagnosis Date  . Anxiety   . Arthritis   . Cardiac conduction disorder 03/30/2012   Overview:  STORY: ETT 03/09/2012 Echo 03/07/2012 normal Dr Jacinto Halim, bradycardia felt due to glaucoma eye drops  . Diverticulosis of colon   . GIST (gastrointestinal stroma tumor), malignant, colon ([facility])   . Heart murmur   . History of colon polyps 10/24/2008  . Hypertension   . Major neurocognitive disorder due to Parkinson's disease, possible    Tremors possible parkinsons  . Manic disorder, single episode, in full remission ([facility]) 12/01/2009  . Sixth nerve palsy     Patient Active Problem List   Diagnosis Date Noted  . Confusion 01/12/2020  . Headache 01/12/2020  . Edema 07/01/2019  . Right knee pain 06/17/2019  . Hypercalcemia 06/17/2019  . Slow transit constipation 05/31/2019  . Bradycardia 05/31/2019  . History of shingles 04/22/2019  . Upper back pain on right side 04/01/2019  . DOE (dyspnea on exertion) 03/25/2018  . Cough variant asthma 03/24/2018  . Parkinsonism ([facility]) 11/15/2015  . Diplopia 10/16/2015  . SBO (small bowel obstruction) ([facility]) 09/18/2013  . Glaucoma 09/18/2013  . GERD (gastroesophageal reflux disease) 09/18/2013   . HLD (hyperlipidemia) 02/21/2013  . Essential (primary) hypertension 02/21/2013  . Barrett esophagus 02/19/2013  . Cardiac conduction disorder 03/30/2012  . Difficulty hearing 08/20/2011  . 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 Allergic rhinitis 09/14/2010  . Anxiety state 12/22/2009  . Bipolar I disorder, single manic episode, in full remission ([facility]) 12/01/2009  . 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 History of colon polyps 10/24/2008  . Arthritis, degenerative 10/24/2008    Past Surgical History:  Procedure Laterality Date  . BILATERAL SALPINGOOPHORECTOMY  09/22/2007  . Gastrointestinal Stroma Tumor,  Other  1960   GIST Surgery  . ILEOCECETOMY  09/22/2007  . OVARIAN CYST REMOVAL Right 1967  . SMALL INTESTINE SURGERY  09/22/2007  . TOTAL KNEE ARTHROPLASTY Right 10/05/2019   Procedure: RIGHT TOTAL KNEE ARTHROPLASTY;  Surgeon: Cammy Copa, MD;  Location: [facility] OR;  Service: Orthopedics;  Laterality: Right;  . TOTAL VAGINAL HYSTERECTOMY  1986   Fibroids     OB History   No obstetric history on file.     Family History  Problem Relation Age of Onset  . Congestive Heart Failure Mother   . Dementia Mother   . Heart disease Father   . Diabetes Father   . Lung cancer Son     Social History   Tobacco Use  . Smoking status: Never Smoker  . Smokeless tobacco: Never Used  Vaping Use  . Vaping Use: Never  used  Substance Use Topics  . Alcohol use: No  . Drug use: No    Home Medications Prior to Admission medications   Medication Sig Start Date End Date Taking? Authorizing Provider  acetaminophen (TYLENOL) 325 MG tablet Take 650 mg by mouth every 4 (four) hours as needed. 650 mg, oral, Every 4 Hours - PRN, Administer 650 mg every four hours PRN for minor pain/headache x 48 hours. Do not exceed 3,000 mg in 24 hours. 01/12/20 01/14/20  [provider]  acetaminophen  (TYLENOL) 500 MG tablet Take 500 mg by mouth every 6 (six) hours as needed. 10/07/19 01/12/20  [provider]  ALPRAZolam Duanne Moron) 0.5 MG tablet TAKE (1) TABLET TWICE DAILY AS NEEDED FOR ANXIETY. Patient taking differently: 0.5 mg. 01/04/20   Mast, Man X, NP  aspirin EC 81 MG tablet Take 81 mg by mouth daily. 10/06/19 01/12/20  [provider]  bismuth subsalicylate (PEPTO BISMOL) 262 MG/15ML suspension Take 30 mLs by mouth every 6 (six) hours as needed for indigestion. 10/05/19 01/12/20  [provider]  celecoxib (CELEBREX) 100 MG capsule Take 1 capsule (100 mg total) by mouth 2 (two) times daily. 10/05/19 10/04/20  Magnant, Gerrianne Scale, PA-C  cholecalciferol (VITAMIN D) 25 MCG (1000 UNIT) tablet Take 1,000 Units by mouth daily. 10/06/19 01/12/20  [provider]  dorzolamide (TRUSOPT) 2 % ophthalmic solution Place 1 drop into both eyes 2 (two) times daily.  10/05/19 01/12/20  [provider]  latanoprost (XALATAN) 0.005 % ophthalmic solution Place 1 drop into both eyes at bedtime. 10/05/19 01/12/20  [provider]  lithium carbonate 300 MG capsule TAKE 1 CAPSULE TWICE DAILY WITH MEALS. Patient taking differently: Start 10/05/2019 to 01/12/2020 11/12/19   [name], MD  losartan (COZAAR) 50 MG tablet Take 50 mg by mouth daily. 10/06/19 01/12/20  [provider]  Multiple Vitamin (MULTIVITAMIN) tablet Take 1 tablet by mouth daily. 10/06/19 01/12/20  [provider]  omeprazole (PRILOSEC) 20 MG capsule Take 20 mg by mouth in the morning and at bedtime. 10/05/19 01/12/20  [provider]  oxyCODONE (ROXICODONE) 5 MG immediate release tablet Take 1 tablet (5 mg total) by mouth every 4 (four) hours as needed. 10/12/19   [name], MD  polyethylene glycol (MIRALAX / GLYCOLAX) 17 g packet Take 17 g by mouth daily. 10/06/19 01/12/20  [provider]  Psyllium (METAMUCIL FIBER PO) Take 1 packet by mouth daily as needed  (Constipation).  10/05/19 01/12/20  [provider]  rOPINIRole (REQUIP) 0.5 MG tablet TAKE 1 TABLET FOUR TIMES DAILY WITH MEALS AND AT BEDTIME. 12/23/19   [name], MD  rOPINIRole (REQUIP) 1 MG tablet Take 1 mg by mouth 2 (two) times daily. In the morning at at bedtime, patient will take a different dose at lunch and dinner see other rx 10/07/19 01/12/20  [provider]    Allergies    Lisinopril, Penicillins, Adhesive [tape], Atorvastatin, Dilaudid [hydromorphone hcl], and Hydromorphone  Review of Systems   Review of Systems  Constitutional: Negative for fever.  HENT: Negative for ear pain.   Eyes: Negative for pain.  Respiratory: Negative for cough.   Cardiovascular: Negative for chest pain.  Gastrointestinal: Negative for abdominal pain.  Genitourinary: Negative for flank pain.  Musculoskeletal: Negative for back pain.  Skin: Negative for rash.  Neurological: Positive for headaches.    Physical Exam Updated Vital Signs BP (!) 173/68   Pulse (!) 50   Temp (!) 97.5 F (  36.4 C) (Oral)   Resp (!) 22   SpO2 100%   Physical Exam Constitutional:      General: She is not in acute distress.    Appearance: Normal appearance.  HENT:     Head: Normocephalic.     Nose: Nose normal.  Eyes:     Extraocular Movements: Extraocular movements intact.  Cardiovascular:     Rate and Rhythm: Normal rate.  Pulmonary:     Effort: Pulmonary effort is normal.  Abdominal:     Palpations: Abdomen is soft.     Tenderness: There is no abdominal tenderness.  Musculoskeletal:        General: Normal range of motion.     Cervical back: Normal range of motion.  Skin:    General: Skin is warm.  Neurological:     General: No focal deficit present.     Mental Status: She is alert and oriented to person, place, and time. Mental status is at baseline.     Cranial Nerves: No cranial nerve deficit.     Comments: Patient moving all extremities with normal strength 5/5.  No  cranial nerve deficits seen.  She is awake and alert answering all questions appropriately.  She does have a resting tremor consistent with her history of Parkinson's disease.     ED Results / Procedures / Treatments   Labs (all labs ordered are listed, but only abnormal results are displayed) Labs Reviewed  COMPREHENSIVE METABOLIC PANEL - Abnormal; Notable for the following components:      Result Value   Chloride 112 (*)    Glucose, Bld 105 (*)    Creatinine, Ser 1.13 (*)    Calcium 10.8 (*)    AST 43 (*)    GFR, Estimated 49 (*)    All other components within normal limits  CBC - Abnormal; Notable for the following components:   MCV 100.7 (*)    All other components within normal limits  CBG MONITORING, ED - Abnormal; Notable for the following components:   Glucose-Capillary 103 (*)    All other components within normal limits  RESP PANEL BY RT-PCR (FLU A&B, COVID) ARPGX2  URINALYSIS, ROUTINE W REFLEX MICROSCOPIC    EKG None  Radiology CT Head Wo Contrast  Result Date: 01/12/2020 CLINICAL DATA:  Altered mental status.  Headache. EXAM: CT HEAD WITHOUT CONTRAST TECHNIQUE: Contiguous axial images were obtained from the base of the skull through the vertex without intravenous contrast. COMPARISON:  None. FINDINGS: Brain: Mild diffuse cortical atrophy is noted. Mild chronic ischemic white matter disease is noted. No mass effect or midline shift is noted. Ventricular size is within normal limits. There is no evidence of mass lesion, hemorrhage or acute infarction. Vascular: No hyperdense vessel or unexpected calcification. Skull: Normal. Negative for fracture or focal lesion. Sinuses/Orbits: No acute finding. Other: None. IMPRESSION: Mild diffuse cortical atrophy. Mild chronic ischemic white matter disease. No acute intracranial abnormality seen. Electronically Signed   By: Marijo Conception M.D.   On: 01/12/2020 19:44    Procedures Procedures (including critical care  time)  Medications Ordered in ED Medications  acetaminophen (TYLENOL) tablet 650 mg (650 mg Oral Given 01/12/20 1712)  ketorolac (TORADOL) 15 MG/ML injection 10 mg (10 mg Intravenous Given 01/12/20 1713)  famotidine (PEPCID) tablet 20 mg (20 mg Oral Given 01/12/20 1712)  prochlorperazine (COMPAZINE) injection 5 mg (5 mg Intravenous Given 01/12/20 1713)    ED Course  I have reviewed the triage vital signs and the nursing  notes.  Pertinent labs & imaging results that were available during my care of the patient were reviewed by me and considered in my medical decision making (see chart for details).    MDM Rules/Calculators/A&P                          Patient does not have any new focal neuro deficit.  Patient given medications with subsequent resolution of her headache.  However family states that she is not herself appears more confused.  They state that she can typically use a walker and get around her room and take care of herself, however she has been staying with her son since yesterday and she has not been able to ambulate and do her daily activities of living.  They are concerned that she is frequently confused and weaker than her typical baseline.    Is unremarkable.  Chemistry and CBC are normal.  Urinalysis still pending.  Covid panel sent and pending result.  CT imaging of brain shows no acute findings.  Etiology is unclear.  Urinalysis has been requested but still pending patient production.  Hospitals have been consulted to bring the patient in.     Final Clinical Impression(s) / ED Diagnoses Final diagnoses:  Altered mental status, unspecified altered mental status type    Rx / DC Orders ED Discharge Orders    None       Luna Fuse, MD 01/12/20 2101

## 2020-01-12 NOTE — ED Notes (Signed)
Patient transported to CT 

## 2020-01-12 NOTE — Progress Notes (Signed)
Location:   SNF [place] Room Number: 21 Place of Service:  SNF (31) Provider: [facility] Tracey Gillentine NP  Tracey Dad, MD  Patient Care Team: Tracey Dad, MD as PCP - General (Internal Medicine) Tracey Sleigh, MD as Consulting Physician (Gynecologic Oncology) Tracey Pier, MD as Consulting Physician (Oncology) Tracey Campbell, MD as Consulting Physician (Gastroenterology) Tracey Jacks, MD as Consulting Physician (Ophthalmology) Tracey Navy, MD as Referring Physician (Neurology)  Extended Emergency Contact Information Primary Emergency Contact: Tracey [name] Address: [address]          [place], [place] [address] [name] of [place] Mobile Phone: [phone] Relation: Son  Code Status: DNR Goals of care: Advanced Directive information Advanced Directives 01/12/2020  Does Patient Have a Medical Advance Directive? Yes  Type of Advance Directive [place]  Does patient want to make changes to medical advance directive? No - Patient declined  Copy of [place] in Chart? Yes - validated most recent copy scanned in chart (See row information)  Would patient like information on creating a medical advance directive? -  Pre-existing out of facility DNR order (yellow form or pink MOST form) -     Chief Complaint  Patient presents with  . Acute Visit    Confusion    HPI:  Pt is a 79 y.o. female seen today for an acute visit for acute confusion, now transferred to [facility] for workup and observation. The patient declined ED eval. The patient revealed her severe headache in the back of her head 10/10, on and off for a couple of weeks, her speech pattern is hesitated which is new to her in addition to acute confusion. The reason the patient wants to be admitted to [facility] is because she thinks all workups including CT head can be done here.     Hx of GIST of small intestine s/p resection.prn MiraLax. Daily Psyllium  . Constipation. Prn Metamucil, MiraLax qd are effective. Hx of Parkinson's, stable(diplopiaR+L lateral,tremor in fingers), on Requip II am, I noon, I pm, II hs.  GERD, stable, on Omeprazole $RemoveBefor'20mg'nVZsJnVWmcjA$  bid HTN, blood pressure is controlled on Losartan  Bipolar disorder, stable, on Lithium $RemoveBe'300mg'jaKJKRMCU$  bid, Alprazolam 0.$RemoveBeforeDEI'5MG'xomzZjdTVjLFUBgj$  BID prn  Bradycardia, improved from 50s from 40s,normalized after Tylenol PM dc'd.  Hypercalcemia, Ca 10.9 10/01/19  OA takes  Tylenol, s/p R knee arthroplasty.   Past Medical History:  Diagnosis Date  . Anxiety   . Arthritis   . Cardiac conduction disorder 03/30/2012   Overview:  STORY: ETT 03/09/2012 Echo 03/07/2012 normal Dr Einar Gip, bradycardia felt due to glaucoma eye drops  . Diverticulosis of colon   . GIST (gastrointestinal stroma tumor), malignant, colon ([place])   . Heart murmur   . History of colon polyps 10/24/2008  . Hypertension   . Major neurocognitive disorder due to Parkinson's disease, possible    Tremors possible parkinsons  . Manic disorder, single episode, in full remission ([place]) 12/01/2009  . Sixth nerve palsy    Past Surgical History:  Procedure Laterality Date  . BILATERAL SALPINGOOPHORECTOMY  09/22/2007  . Gastrointestinal Stroma Tumor,  Other  1960   GIST Surgery  . ILEOCECETOMY  09/22/2007  . OVARIAN CYST REMOVAL Right 1967  . SMALL INTESTINE SURGERY  09/22/2007  . TOTAL KNEE ARTHROPLASTY Right 10/05/2019   Procedure: RIGHT TOTAL KNEE ARTHROPLASTY;  Surgeon: Meredith Pel, MD;  Location: [place];  Service: Orthopedics;  Laterality: Right;  . [place]  Fibroids    Allergies  Allergen Reactions  . Lisinopril Cough  . Penicillins Itching    50 years ago  . Adhesive [Tape] Itching  . Atorvastatin Itching  . Dilaudid [Hydromorphone Hcl] Itching  . Hydromorphone Itching    Allergies as of 01/12/2020      Reactions   Lisinopril Cough   Penicillins  Itching   50 years ago   Adhesive [tape] Itching   Atorvastatin Itching   Dilaudid [hydromorphone Hcl] Itching   Hydromorphone Itching      Medication List       Accurate as of January 12, 2020  2:43 PM. If you have any questions, ask your nurse or doctor.        acetaminophen 500 MG tablet Commonly known as: TYLENOL Take 500 mg by mouth every 6 (six) hours as needed. What changed: Another medication with the same name was removed. Continue taking this medication, and follow the directions you see here. Changed by: Lenna Hagarty X Shabre Kreher, NP   acetaminophen 325 MG tablet Commonly known as: TYLENOL Take 650 mg by mouth every 4 (four) hours as needed. 650 mg, oral, Every 4 Hours - PRN, Administer 650 mg every four hours PRN for minor pain/headache x 48 hours. Do not exceed 3,000 mg in 24 hours. What changed: Another medication with the same name was removed. Continue taking this medication, and follow the directions you see here. Changed by: Liviya Santini X Raelyn Racette, NP   ALPRAZolam 0.5 MG tablet Commonly known as: XANAX TAKE (1) TABLET TWICE DAILY AS NEEDED FOR ANXIETY. What changed: See the new instructions.   aspirin EC 81 MG tablet Take 81 mg by mouth daily.   bismuth subsalicylate 161 WR/60AV suspension Commonly known as: PEPTO BISMOL Take 30 mLs by mouth every 6 (six) hours as needed for indigestion.   celecoxib 100 MG capsule Commonly known as: CeleBREX Take 1 capsule (100 mg total) by mouth 2 (two) times daily.   cholecalciferol 25 MCG (1000 UNIT) tablet Commonly known as: VITAMIN D Take 1,000 Units by mouth daily.   dorzolamide 2 % ophthalmic solution Commonly known as: TRUSOPT Place 1 drop into both eyes 2 (two) times daily.   latanoprost 0.005 % ophthalmic solution Commonly known as: XALATAN Place 1 drop into both eyes at bedtime.   lithium carbonate 300 MG capsule TAKE 1 CAPSULE TWICE DAILY WITH MEALS. What changed: See the new instructions.   losartan 50 MG  tablet Commonly known as: COZAAR Take 50 mg by mouth daily.   METAMUCIL FIBER PO Take 1 packet by mouth daily as needed (Constipation).   multivitamin tablet Take 1 tablet by mouth daily.   omeprazole 20 MG capsule Commonly known as: PRILOSEC Take 20 mg by mouth in the morning and at bedtime.   oxyCODONE 5 MG immediate release tablet Commonly known as: Roxicodone Take 1 tablet (5 mg total) by mouth every 4 (four) hours as needed.   polyethylene glycol 17 g packet Commonly known as: MIRALAX / GLYCOLAX Take 17 g by mouth daily.   rOPINIRole 1 MG tablet Commonly known as: REQUIP Take 1 mg by mouth 2 (two) times daily. In the morning at at bedtime, patient will take a different dose at lunch and dinner see other rx   rOPINIRole 0.5 MG tablet Commonly known as: REQUIP TAKE 1 TABLET FOUR TIMES DAILY WITH MEALS AND AT BEDTIME.       Review of Systems  Constitutional: Negative for appetite change, fatigue and fever.  HENT: Positive  for hearing loss. Negative for congestion and trouble swallowing.   Eyes: Negative for visual disturbance.       Glaucoma, diplopia R+L lateral peripheral visual fields only.   Respiratory: Positive for cough. Negative for shortness of breath.        Occasionally DOE, hacking cough  Cardiovascular: Positive for leg swelling.  Gastrointestinal: Negative for abdominal pain and constipation.  Genitourinary: Negative for dysuria, frequency and urgency.  Musculoskeletal: Positive for arthralgias and gait problem.       Walker.   Skin: Negative for color change.  Neurological: Positive for tremors, speech difficulty and headaches. Negative for dizziness, facial asymmetry and light-headedness.       Tremors in hands/fingers at rest. L>R, progressing  Psychiatric/Behavioral: Positive for confusion. Negative for behavioral problems and sleep disturbance. The patient is not nervous/anxious.     Immunization History  Administered Date(s) Administered  .  Influenza Split 10/24/2008, 09/26/2009, 10/08/2011, 10/05/2012  . Influenza, High Dose Seasonal PF 11/02/2013, 11/02/2013, 11/03/2017, 10/26/2018, 10/27/2019  . Influenza,inj,Quad PF,6+ Mos 11/01/2014, 10/11/2015, 10/11/2016  . Influenza-Unspecified 11/02/2013, 10/11/2016  . Moderna Sars-Covid-2 Vaccination 01/18/2019, 02/15/2019  . Pneumococcal Conjugate-13 08/20/2011  . Pneumococcal Polysaccharide-23 01/14/2005, 06/29/2014  . Pneumococcal-Unspecified 01/14/2005, 06/29/2014  . Tdap 08/20/2011  . Zoster 10/08/2011  . Zoster Recombinat (Shingrix) 11/09/2018, 12/01/2018, 03/01/2019   Pertinent  Health Maintenance Due  Topic Date Due  . INFLUENZA VACCINE  Completed  . DEXA SCAN  Completed  . PNA vac Low Risk Adult  Completed   Fall Risk  07/01/2019 04/22/2019 04/08/2019  Falls in the past year? 0 0 0  Number falls in past yr: 0 0 0   Functional Status Survey:    Vitals:   01/12/20 1312  BP: 132/70  Pulse: 64  Resp: 18  Temp: 97.7 F (36.5 C)  SpO2: 98%  Weight: 150 lb 8 oz (68.3 kg)  Height: $Remove'5\' 2"'SCNRJRy$  (1.575 m)   Body mass index is 27.53 kg/m. Physical Exam Vitals and nursing note reviewed.  Constitutional:      Appearance: Normal appearance.  HENT:     Head: Normocephalic and atraumatic.     Mouth/Throat:     Mouth: Mucous membranes are moist.  Eyes:     Extraocular Movements: Extraocular movements intact.     Conjunctiva/sclera: Conjunctivae normal.     Pupils: Pupils are equal, round, and reactive to light.  Cardiovascular:     Rate and Rhythm: Normal rate and regular rhythm.     Heart sounds: No murmur heard.   Pulmonary:     Breath sounds: No rales.  Abdominal:     General: Bowel sounds are normal.     Palpations: Abdomen is soft.     Tenderness: There is no abdominal tenderness.  Musculoskeletal:        General: No tenderness.     Cervical back: Normal range of motion and neck supple.     Right lower leg: Edema present.     Left lower leg: Edema present.      Comments: Chronic R knee pain is improved. S/p TKR right, healed. Trace edema BLE  Skin:    General: Skin is warm and dry.  Neurological:     General: No focal deficit present.     Mental Status: She is alert and oriented to person, place, and time. Mental status is at baseline.     Motor: No weakness.     Coordination: Coordination abnormal.     Gait: Gait abnormal.  Comments: Resting tremor in fingers, improved.   Psychiatric:        Mood and Affect: Mood normal.        Behavior: Behavior normal.        Thought Content: Thought content normal.        Judgment: Judgment normal.     Labs reviewed: Recent Labs    04/05/19 0905 04/06/19 1245 05/28/19 0000 10/01/19 1139  NA 140 139 142 140  K 4.3 4.4 4.6 4.4  CL 109 109 102 109  CO2 23 22 25* 24  GLUCOSE 127* 103*  --  91  BUN 16 17 26* 20  CREATININE 0.91 0.82 1.0 0.84  CALCIUM 11.4* 10.8* 11.2* 10.9*   Recent Labs    04/05/19 0905 04/06/19 1245 05/28/19 0000  AST $Re'19 23 22  'cCe$ ALT $R'13 18 13  'bK$ ALKPHOS  --  75 101  BILITOT 0.4 0.5  --   PROT 6.9 6.7  --   ALBUMIN  --  4.0 4.6   Recent Labs    04/05/19 0905 04/06/19 1245 05/28/19 0000 10/01/19 1139  WBC 8.2 8.1 8.7 8.7  NEUTROABS 5,666 6.0 7,439  --   HGB 12.9 12.4 14.1 12.5  HCT 37.4 39.0 41 41.1  MCV 97.9 105.4*  --  102.0*  PLT 297 268 336 324   Lab Results  Component Value Date   TSH 2.94 04/05/2019   No results found for: HGBA1C Lab Results  Component Value Date   CHOL 284 (H) 04/05/2019   HDL 73 04/05/2019   LDLCALC 173 (H) 04/05/2019   TRIG 225 (H) 04/05/2019   CHOLHDL 3.9 04/05/2019    Significant Diagnostic Results in last 30 days:  No results found.  Assessment/Plan: Confusion acute confusion in setting of sever headache 10/10 and new speech hesitation, the patient agreed to ED for furhter evaluation after transferred to [facility] for workup and observation initially. May need  CBC/diff, CMP/eGFR, TSH, Lithium level as well.    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 Daily Psyllium .  Slow transit constipation Constipation. Prn Metamucil, MiraLax qd are effective.   Parkinsonism ([place]) Hx of Parkinson's, stable(diplopiaR+L lateral,tremor in fingers), on Requip II am, I noon, I pm, II hs.  GERD (gastroesophageal reflux disease) GERD, stable, on Omeprazole $RemoveBefor'20mg'FgRPysRKfSdw$  bid   Essential (primary) hypertension HTN, blood pressure is controlled on Losartan   Bipolar I disorder, single manic episode, in full remission Bipolar disorder, stable, on Lithium $RemoveBe'300mg'sgfthSsZQ$  bid, Alprazolam 0.$RemoveBeforeDEI'5MG'fjWOCqwKxzJkcNvG$  BID prn   Bradycardia Bradycardia, improved from 50s from 40s,normalized after Tylenol PM dc'd.    Hypercalcemia Hypercalcemia, Ca 10.9 10/01/19   Right knee pain OA takes Tylenol, s/p R knee arthroplasty.    Headache Episodic, uncertain of onset, duration about a couple of weeks, severity 10/10, location is in occipital, accompany symptoms are confusion and speech hesitation. The patient desires ED eval since CT head cannot be done in SNF [facility].     Family/ staff Communication: plan of care reviewed with the patient and charge nurse.   Labs/tests ordered:  CBC/diff, CMP/eGFR, TSH, Lithium level.    Time spend 35 minutes.

## 2020-01-12 NOTE — Assessment & Plan Note (Addendum)
OA takes  Tylenol, s/p R knee arthroplasty.  

## 2020-01-12 NOTE — ED Notes (Addendum)
Son at bedside.

## 2020-01-12 NOTE — Assessment & Plan Note (Signed)
HTN, blood pressure is controlled on Losartan

## 2020-01-12 NOTE — Assessment & Plan Note (Signed)
Hx of Parkinson's, stable(diplopiaR+L lateral,tremor in fingers), on Requip II am, I noon, I pm, II hs.

## 2020-01-12 NOTE — ED Triage Notes (Signed)
Pt BIBA from [facility]-  Per EMS- Pt reports ongoing headache, worsening tremors, difficulty speaking x2 weeks. Hx of Parkinsons.  Pt reports progressing of disease from only hand tremors 6 months ago, to now all extremities have tremors.    Pt has tried tylenol for headache, no relief.

## 2020-01-12 NOTE — H&P (Signed)
History and Physical    Wanona ANITTA TENNY ZOX:[id number] DOB: Oct 18, 1940 DOA: 01/12/2020  PCP: [name], MD  Patient coming from: Home  I have personally briefly reviewed patient's old medical records in [place]  Chief Complaint: Change in speech, confusion, functional decline  HPI: Yvett Viona Gilmore Rozas is a 79 y.o. 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  History supplemented by patient's son at bedside.  Per son, patient has a working diagnosis of Parkinson's disease.  2 weeks ago she was essentially independent and able to ambulate on her own indoors and with the assistance of a walker outdoors.  She had been able to complete her ADLs.  She had a significant change 2 weeks ago with significant generalized weakness requiring maximum assistance to stand.  She has an apparent slow shuffling gait.  Voice has become soft and stuttering.    Patient states she has been having difficulty getting the right words out.  She has had intermittent posterior headache below the occiput.  She has chronic tremors in her hands.  She says she can no longer complete her ADLs.  She had been staying in [facility] independent living.  Son states that she recently got lost at the independent living facility which is unusual for her.  ED Course:  Initial vitals showed BP 123/93, pulse 53, RR 22, temp 97.5 F, SPO2 100% on room air.  Initial labs show sodium 141, potassium 4.9, bicarb 22, BUN 18, creatinine 1.13, serum glucose 105, calcium 10.8, albumin 4.2, AST 43, ALT 23, alk phos 85, total bilirubin 0.8, WBC 10.2, hemoglobin 13.1, platelets 326,000.  SARS-CoV-2 PCR is  negative.  Urinalysis is negative for UTI.  CT head without contrast shows mild diffuse cortical atrophy, mild chronic ischemic white matter disease.  No acute intracranial abnormality seen.  Patient was given IV Toradol 10 mg, IV Compazine, Pepcid, Tylenol with transient improvement in headache.The hospitalist service was consulted to admit for further evaluation and management.  Review of Systems: All systems reviewed and are negative except as documented in history of present illness above.   Past Medical History:  Diagnosis Date  . Anxiety   . Arthritis   . Cardiac conduction disorder 03/30/2012   Overview:  STORY: ETT 03/09/2012 Echo 03/07/2012 normal Dr Einar Gip, bradycardia felt due to glaucoma eye drops  . Diverticulosis of colon   . GIST (gastrointestinal stroma tumor), malignant, colon ([place])   . Heart murmur   . History of colon polyps 10/24/2008  . Hypertension   . Major neurocognitive disorder due to Parkinson's disease, possible    Tremors possible parkinsons  . Manic disorder, single episode, in full remission ([place]) 12/01/2009  . Sixth nerve palsy     Past Surgical History:  Procedure Laterality Date  . BILATERAL SALPINGOOPHORECTOMY  09/22/2007  . Gastrointestinal Stroma Tumor,  Other  1960   GIST Surgery  . ILEOCECETOMY  09/22/2007  . OVARIAN CYST REMOVAL Right 1967  . SMALL INTESTINE SURGERY  09/22/2007  . TOTAL KNEE ARTHROPLASTY Right 10/05/2019   Procedure: RIGHT TOTAL KNEE ARTHROPLASTY;  Surgeon: Meredith Pel, MD;  Location: [place];  Service: Orthopedics;  Laterality: Right;  . TOTAL VAGINAL HYSTERECTOMY  1986   Fibroids    Social  History:  reports that she has never smoked. She has never used smokeless tobacco. She reports that she does not drink alcohol and does not use drugs.  Allergies  Allergen Reactions  . Lisinopril Cough  . Penicillins Itching    50 years ago  . Adhesive [Tape] Itching  . Atorvastatin Itching  . Dilaudid [Hydromorphone Hcl]  Itching  . Hydromorphone Itching    Family History  Problem Relation Age of Onset  . Congestive Heart Failure Mother   . Dementia Mother   . Heart disease Father   . Diabetes Father   . Lung cancer Son      Prior to Admission medications   Medication Sig Start Date End Date Taking? Authorizing Provider  acetaminophen (TYLENOL) 325 MG tablet Take 650 mg by mouth every 4 (four) hours as needed. 650 mg, oral, Every 4 Hours - PRN, Administer 650 mg every four hours PRN for minor pain/headache x 48 hours. Do not exceed 3,000 mg in 24 hours. 01/12/20 01/14/20  [provider]  acetaminophen (TYLENOL) 500 MG tablet Take 500 mg by mouth every 6 (six) hours as needed. 10/07/19 01/12/20  [provider]  ALPRAZolam Prudy Feeler) 0.5 MG tablet TAKE (1) TABLET TWICE DAILY AS NEEDED FOR ANXIETY. Patient taking differently: 0.5 mg. 01/04/20   Mast, Man X, NP  aspirin EC 81 MG tablet Take 81 mg by mouth daily. 10/06/19 01/12/20  [provider]  bismuth subsalicylate (PEPTO BISMOL) 262 MG/15ML suspension Take 30 mLs by mouth every 6 (six) hours as needed for indigestion. 10/05/19 01/12/20  [provider]  celecoxib (CELEBREX) 100 MG capsule Take 1 capsule (100 mg total) by mouth 2 (two) times daily. 10/05/19 10/04/20  Magnant, Joycie Peek, PA-C  cholecalciferol (VITAMIN D) 25 MCG (1000 UNIT) tablet Take 1,000 Units by mouth daily. 10/06/19 01/12/20  [provider]  dorzolamide (TRUSOPT) 2 % ophthalmic solution Place 1 drop into both eyes 2 (two) times daily.  10/05/19 01/12/20  [provider]  latanoprost (XALATAN) 0.005 % ophthalmic solution Place 1 drop into both eyes at bedtime. 10/05/19 01/12/20  [provider]  lithium carbonate 300 MG capsule TAKE 1 CAPSULE TWICE DAILY WITH MEALS. Patient taking differently: Start 10/05/2019 to 01/12/2020 11/12/19   Mahlon Gammon, MD  losartan (COZAAR) 50 MG tablet Take 50 mg by mouth daily. 10/06/19 01/12/20   [provider]  Multiple Vitamin (MULTIVITAMIN) tablet Take 1 tablet by mouth daily. 10/06/19 01/12/20  [provider]  omeprazole (PRILOSEC) 20 MG capsule Take 20 mg by mouth in the morning and at bedtime. 10/05/19 01/12/20  [provider]  oxyCODONE (ROXICODONE) 5 MG immediate release tablet Take 1 tablet (5 mg total) by mouth every 4 (four) hours as needed. 10/12/19   Mahlon Gammon, MD  polyethylene glycol (MIRALAX / GLYCOLAX) 17 g packet Take 17 g by mouth daily. 10/06/19 01/12/20  [provider]  Psyllium (METAMUCIL FIBER PO) Take 1 packet by mouth daily as needed (Constipation).  10/05/19 01/12/20  [provider]  rOPINIRole (REQUIP) 0.5 MG tablet TAKE 1 TABLET FOUR TIMES DAILY WITH MEALS AND AT BEDTIME. 12/23/19   Mahlon Gammon, MD  rOPINIRole (REQUIP) 1 MG tablet Take 1 mg by mouth 2 (two) times daily. In the morning at at bedtime, patient will take a different dose at lunch and dinner see other rx 10/07/19 01/12/20  [provider]    Physical Exam: Vitals:   01/12/20 1945 01/12/20 2000 01/12/20 2030 01/12/20 2100  BP: (!) 171/63 (!) 173/68 (!) 175/86 (!) 178/83  Pulse: (!) 49 (!) 50 (!) 53 (!) 52  Resp: 19 (!) 22 (!) 21 19  Temp:      TempSrc:      SpO2: 98% 100% 100% 100%   Constitutional: Elderly woman resting supine in bed, NAD, calm, comfortable Eyes: Right pupil appears to dilate with light, left pupil constricts to light.  EOMI intact. ENMT: Mucous membranes are moist. Posterior pharynx clear of any exudate or lesions.Normal dentition.  Neck: normal, supple, no masses. Respiratory: clear to auscultation bilaterally, no wheezing, no crackles. Normal respiratory effort. No accessory muscle use.  Cardiovascular: Regular rate and rhythm, systolic murmur present.  Right lower extremity larger compared to left without pitting edema. 2+ pedal pulses. Abdomen: no tenderness, no masses palpated. No hepatosplenomegaly. Bowel  sounds positive.  Musculoskeletal: no clubbing / cyanosis.  Laterally deviated first toes on both feet.  ROM intact all extremities.  Cogwheel rigidity in both upper extremities. Skin: no rashes, lesions, ulcers. No induration Neurologic: Speech is soft and hesitant without dysarthria.  Strength intact all extremities.  Tremors of both hands and jaw present.  Sensation appears intact.  Cogwheel rigidity of both upper extremities present.  Gait not assessed. Psychiatric: Alert and oriented x 3.  Circumferential speech.   Labs on Admission: I have personally reviewed following labs and imaging studies  CBC: Recent Labs  Lab 01/12/20 1610  WBC 10.2  HGB 13.1  HCT 42.2  MCV 100.7*  PLT 478   Basic Metabolic Panel: Recent Labs  Lab 01/12/20 1610  NA 141  K 4.9  CL 112*  CO2 22  GLUCOSE 105*  BUN 18  CREATININE 1.13*  CALCIUM 10.8*   GFR: Estimated Creatinine Clearance: 36.6 mL/min (A) (by C-G formula based on SCr of 1.13 mg/dL (H)). Liver Function Tests: Recent Labs  Lab 01/12/20 1610  AST 43*  ALT 23  ALKPHOS 85  BILITOT 0.8  PROT 7.2  ALBUMIN 4.2   No results for input(s): LIPASE, AMYLASE in the last 168 hours. No results for input(s): AMMONIA in the last 168 hours. Coagulation Profile: No results for input(s): INR, PROTIME in the last 168 hours. Cardiac Enzymes: No results for input(s): CKTOTAL, CKMB, CKMBINDEX, TROPONINI in the last 168 hours. BNP (last 3 results) No results for input(s): PROBNP in the last 8760 hours. HbA1C: No results for input(s): HGBA1C in the last 72 hours. CBG: Recent Labs  Lab 01/12/20 1651  GLUCAP 103*   Lipid Profile: No results for input(s): CHOL, HDL, LDLCALC, TRIG, CHOLHDL, LDLDIRECT in the last 72 hours. Thyroid Function Tests: No results for input(s): TSH, T4TOTAL, FREET4, T3FREE, THYROIDAB in the last 72 hours. Anemia Panel: No results for input(s): VITAMINB12, FOLATE, FERRITIN, TIBC, IRON, RETICCTPCT in the last 72  hours. Urine analysis:    Component Value Date/Time   COLORURINE STRAW (A) 01/12/2020 2103   APPEARANCEUR CLEAR 01/12/2020 2103   LABSPEC 1.012 01/12/2020 2103   PHURINE 6.0 01/12/2020 2103   GLUCOSEU NEGATIVE 01/12/2020 2103   HGBUR NEGATIVE 01/12/2020 2103   BILIRUBINUR NEGATIVE 01/12/2020 2103   KETONESUR NEGATIVE 01/12/2020 2103   PROTEINUR NEGATIVE 01/12/2020 2103   NITRITE NEGATIVE 01/12/2020 2103   LEUKOCYTESUR NEGATIVE 01/12/2020 2103    Radiological Exams on Admission: CT Head Wo Contrast  Result Date: 01/12/2020 CLINICAL DATA:  Altered mental status.  Headache. EXAM: CT HEAD WITHOUT CONTRAST TECHNIQUE: Contiguous axial images were obtained from the base of the skull through the vertex without  intravenous contrast. COMPARISON:  None. FINDINGS: Brain: Mild diffuse cortical atrophy is noted. Mild chronic ischemic white matter disease is noted. No mass effect or midline shift is noted. Ventricular size is within normal limits. There is no evidence of mass lesion, hemorrhage or acute infarction. Vascular: No hyperdense vessel or unexpected calcification. Skull: Normal. Negative for fracture or focal lesion. Sinuses/Orbits: No acute finding. Other: None. IMPRESSION: Mild diffuse cortical atrophy. Mild chronic ischemic white matter disease. No acute intracranial abnormality seen. Electronically Signed   By: Marijo Conception M.D.   On: 01/12/2020 19:44    EKG: Personally reviewed. Sinus bradycardia without acute ischemic changes.  Not significantly changed when compared to prior.  Assessment/Plan Principal Problem:   Altered mental state Active Problems:   HLD (hyperlipidemia)   Essential (primary) hypertension   Bipolar I disorder, single manic episode, in full remission ([place])   Parkinsonism ([place])   Bradycardia   Hypercalcemia  Lake Viona Gilmore Holzheimer is a 79 y.o. 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   Altered mental status/change in speech and gait/functional decline/headaches Parkinson's disease: CT head negative for acute changes.  Urinalysis negative for UTI.  No other obvious infectious source.  Will obtain MRI brain to rule out acute CVA.  This could be rapid progression of her presumed Parkinson's disease.  Differential also includes giant cell arteritis however headache is posterior and not temporal.  Polymyalgia rheumatica also possible.  She has apparent significant decline over the last 2 weeks no longer able to complete ADLs or ambulate on her own power like she used to be able.  Last saw neurology in March 2021 but not actively following since. -Obtain MRI brain -Check TSH, B12, ESR, CRP -PT/OT eval -Needs outpatient follow-up with neurology, consider inpatient evaluation pending MRI brain  Hypercalcemia: Appears to be a chronic issue.  Start on IV fluid hydration.  Check PTH.  Bradycardia: Chronic and appears stable.  Continue to monitor.  Hypertension: Hold home losartan for now pending MRI brain to rule out CVA.  Bipolar disorder: Lithium level 1.00.  Continue home lithium and Xanax only as needed.  OA s/p right TKA 10/05/2019: Patient has noted swelling of right lower extremity compared to left.  Will check RLE with Doppler to assess for DVT.  DVT prophylaxis: Lovenox Code Status: DNR, confirmed with patient Family Communication: Discussed with patient's son at bedside Disposition Plan: Previously at independent living but came from home.  Anticipate will need SNF on discharge. Consults called: None Admission status:  Status is: Observation  The patient remains OBS appropriate and will d/c before 2  midnights.  Dispo: The patient is from: Home              Anticipated d/c is to: SNF              Anticipated d/c date is: 1 day              Patient currently is not medically stable to d/c.   Zada Finders MD [facility]  If 7PM-7AM, please contact night-coverage [email or web address]  01/12/2020, 9:28 PM

## 2020-01-12 NOTE — Assessment & Plan Note (Addendum)
acute confusion in setting of sever headache 10/10 and new speech hesitation, the patient agreed to ED for furhter evaluation after transferred to [facility] for workup and observation initially. May need  CBC/diff, CMP/eGFR, TSH, Lithium level as well.

## 2020-01-12 NOTE — Assessment & Plan Note (Signed)
Hx of GIST of small intestine s/p resection. prn MiraLax. Daily Psyllium . 

## 2020-01-12 NOTE — Assessment & Plan Note (Signed)
Hypercalcemia, Ca 10.9 10/01/19

## 2020-01-12 NOTE — Assessment & Plan Note (Signed)
Episodic, uncertain of onset, duration about a couple of weeks, severity 10/10, location is in occipital, accompany symptoms are confusion and speech hesitation. The patient desires ED eval since CT head cannot be done in SNF [facility].

## 2020-01-13 ENCOUNTER — Observation Stay (HOSPITAL_COMMUNITY): Payer: Medicare Other

## 2020-01-13 ENCOUNTER — Observation Stay (HOSPITAL_BASED_OUTPATIENT_CLINIC_OR_DEPARTMENT_OTHER): Payer: Medicare Other

## 2020-01-13 ENCOUNTER — Encounter (HOSPITAL_COMMUNITY): Payer: Self-pay | Admitting: Internal Medicine

## 2020-01-13 DIAGNOSIS — F0280 Dementia in other diseases classified elsewhere without behavioral disturbance: Secondary | ICD-10-CM | POA: Diagnosis not present

## 2020-01-13 DIAGNOSIS — F304 Manic episode in full remission: Secondary | ICD-10-CM | POA: Diagnosis not present

## 2020-01-13 DIAGNOSIS — L03115 Cellulitis of right lower limb: Secondary | ICD-10-CM | POA: Diagnosis not present

## 2020-01-13 DIAGNOSIS — G20 Parkinson's disease: Secondary | ICD-10-CM | POA: Diagnosis not present

## 2020-01-13 DIAGNOSIS — R4182 Altered mental status, unspecified: Secondary | ICD-10-CM | POA: Diagnosis not present

## 2020-01-13 DIAGNOSIS — M7989 Other specified soft tissue disorders: Secondary | ICD-10-CM

## 2020-01-13 LAB — CBC
HCT: 38.9 % (ref 36.0–46.0)
Hemoglobin: 12.2 g/dL (ref 12.0–15.0)
MCH: 31.5 pg (ref 26.0–34.0)
MCHC: 31.4 g/dL (ref 30.0–36.0)
MCV: 100.5 fL — ABNORMAL HIGH (ref 80.0–100.0)
Platelets: 272 10*3/uL (ref 150–400)
RBC: 3.87 MIL/uL (ref 3.87–5.11)
RDW: 13.2 % (ref 11.5–15.5)
WBC: 8.3 10*3/uL (ref 4.0–10.5)
nRBC: 0 % (ref 0.0–0.2)

## 2020-01-13 LAB — BASIC METABOLIC PANEL
Anion gap: 6 (ref 5–15)
BUN: 20 mg/dL (ref 8–23)
CO2: 22 mmol/L (ref 22–32)
Calcium: 9.8 mg/dL (ref 8.9–10.3)
Chloride: 115 mmol/L — ABNORMAL HIGH (ref 98–111)
Creatinine, Ser: 0.98 mg/dL (ref 0.44–1.00)
GFR, Estimated: 59 mL/min — ABNORMAL LOW (ref >60)
Glucose, Bld: 102 mg/dL — ABNORMAL HIGH (ref 70–99)
Potassium: 3.9 mmol/L (ref 3.5–5.1)
Sodium: 143 mmol/L (ref 135–145)

## 2020-01-13 LAB — SEDIMENTATION RATE: Sed Rate: 4 mm/hr (ref 0–22)

## 2020-01-13 MED ORDER — SODIUM CHLORIDE 0.9 % IV SOLN
1.0000 g | Freq: Every day | INTRAVENOUS | Status: DC
  Administered 2020-01-13 – 2020-01-15 (×3): 1 g via INTRAVENOUS
  Filled 2020-01-13: qty 10
  Filled 2020-01-13 (×3): qty 1

## 2020-01-13 MED ORDER — KETOROLAC TROMETHAMINE 15 MG/ML IJ SOLN
15.0000 mg | Freq: Once | INTRAMUSCULAR | Status: AC
  Administered 2020-01-13: 21:00:00 15 mg via INTRAVENOUS
  Filled 2020-01-13: qty 1

## 2020-01-13 MED ORDER — CARBIDOPA-LEVODOPA 10-100 MG PO TABS
1.0000 | ORAL_TABLET | Freq: Three times a day (TID) | ORAL | Status: DC
  Administered 2020-01-13 – 2020-01-18 (×14): 1 via ORAL
  Filled 2020-01-13 (×16): qty 1

## 2020-01-13 NOTE — Evaluation (Signed)
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  Clinical Impression  The patient is alert, has  Much difficulty expressing self. Patient did mobilize from be to [facility] and back with min Assistance. Patient  With noted tremors. Patient has been residing in ILF at [facility] recently requiring increased assistance.  Pt admitted with above diagnosis.  Pt currently with functional limitations due to the deficits listed below (see PT Problem List). Pt will benefit from skilled PT to increase their independence and safety with mobility to allow discharge to the venue listed below.       Follow Up Recommendations Home health PT;SNF  If not 24/7 availalble.   Equipment Recommendations   (pt. askin for a new rollator)    Recommendations for Other Services       Precautions / Restrictions Precautions Precautions: Fall Precaution Comments: incontinence-use pads Restrictions Weight Bearing Restrictions: No      Mobility  Bed Mobility Overal bed mobility: Needs Assistance Bed Mobility: Supine to Sit;Sit to Supine     Supine to sit: Min assist;HOB elevated Sit to supine: Min assist   General bed mobility comments: increased time to initiate bed mobility, min A for safety    Transfers Overall transfer level: Needs assistance Equipment used: Rolling walker (2 wheeled) Transfers: Sit to/from Stand Sit to Stand: Min assist         General transfer comment: cues to reach fo RW. Min assist to turn to [facility]. Then took a few steps back to bed.  Ambulation/Gait                 Stairs            Wheelchair Mobility    Modified Rankin (Stroke Patients Only)       Balance Overall balance assessment: Needs assistance Sitting-balance support: Feet supported Sitting balance-Leahy Scale: Fair     Standing balance support: Single extremity supported;Bilateral upper extremity supported Standing balance-Leahy Scale: Poor Standing balance comment: reliant on at least 1 UE support                             Pertinent Vitals/Pain Pain Assessment: 0-10 Pain Score: 6  Pain Location: neck Pain Descriptors / Indicators: Sore;Aching Pain Intervention(s): Monitored during session;Heat applied    Home Living Family/patient expects to be discharged to:: Assisted living               Home Equipment: Gilford Rile - 4 wheels Additional Comments: resides at [facility].    Prior Function Level of Independence: Independent with assistive device(s)         Comments: per chart review up until 2 weeks ago patient was mod I with self care and using rollator for outdoor mobility.     Hand Dominance   Dominant Hand: Right    Extremity/Trunk Assessment   Upper Extremity Assessment Upper Extremity Assessment: Generalized weakness (noted tremors with activity)    Lower Extremity Assessment Lower Extremity Assessment: Generalized weakness    Cervical / Trunk Assessment Cervical / Trunk Assessment: Normal;Other exceptions Cervical / Trunk Exceptions: trunk titubations in  sitting,  Communication   Communication: Other (comment)  Cognition Arousal/Alertness: Awake/alert Behavior During Therapy: WFL for tasks assessed/performed Overall Cognitive Status: Impaired/Different from baseline Area of Impairment: Orientation;Memory;Following commands;Problem solving                 Orientation Level: Disoriented to;Time   Memory: Decreased short-term memory Following Commands: Follows one step commands with increased time     Problem  Solving: Slow processing;Requires verbal cues General Comments: pt unable to recall month/year, requires increased time for processing, distractible and tangential      General Comments      Exercises     Assessment/Plan    PT Assessment Patient needs continued PT services  PT Problem List Decreased strength;Decreased balance;Decreased cognition;Decreased knowledge of precautions;Decreased mobility       PT Treatment Interventions DME instruction;Therapeutic activities;Cognitive remediation;Gait training;Therapeutic exercise;Patient/family education;Functional mobility training;Balance training    PT Goals (Current goals can be found in the Care Plan section)  Acute Rehab PT Goals Patient Stated Goal: "my neck hurts" PT Goal Formulation: With patient Time For Goal Achievement: 01/27/20 Potential to Achieve Goals: Good    Frequency Min 2X/week   Barriers to discharge        Co-evaluation               AM-PAC PT "6 Clicks" Mobility  Outcome Measure Help needed turning from your back to your side while in a flat bed without using bedrails?: A Little Help needed moving from lying on your back to sitting on the side of a flat bed without using bedrails?: A Little Help needed moving to and from a bed to a chair (including a wheelchair)?: A Lot Help needed standing up from a chair using your arms (e.g., wheelchair or bedside chair)?: A Lot Help needed to walk in hospital room?: A Lot Help needed climbing 3-5 steps with a railing? : A Lot 6 Click Score: 14    End of Session   Activity Tolerance: Patient tolerated treatment well Patient left: in bed;with call bell/phone within reach;with bed alarm set Nurse Communication: Mobility status PT Visit Diagnosis: Unsteadiness on feet (R26.81);Difficulty in walking, not elsewhere classified (R26.2)    Time: 0623-7628 PT Time Calculation (min) (ACUTE ONLY): 35 min   Charges:   PT Evaluation $PT Eval Low Complexity: 1  Low          Blanchard Kelch PT Acute Rehabilitation Services Pager [phone] Office [phone]   Rada Hay 01/13/2020, 1:30 PM

## 2020-01-13 NOTE — ED Notes (Signed)
Pt would like to son to accompany her to MRI. Called him twice, left a message @[phone] . Will attempt again.

## 2020-01-13 NOTE — Consult Note (Signed)
Neurology Consultation  Reason for Consult: Change in speech, confusion, functional decline with expected Parkinson's disease  Referring Physician: Dr. Posey Pronto  CC: change in speech, confusion, functional decline  History is obtained from: Patient, patient's son at bedside, chart review  HPI: Tracey [name] is a 79 y.o. 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 Per son at bedside, Tracey [name] has had a diagnosis of Parkinsonian-like Syndrome since 2017 but received the diagnosis of Parkinson's Disease by a neurologist in [place] approximately 6 months ago. She has been taking Requip for 4 years and had her dose increased approximately one month ago for increasing tremor. Per chart review, she had seen Dr. Sabra Heck with [facility] in [facility] 03/17/2019 and was prescribed Sinemet. This medication is unknown to her son and she denies taking this medication for her Parkinson's disease. She also endorses an occipital headache currently a 5/10. She denies any bowel or bladder dysfunction or REM sleep disorder, but does endorse vivid dreams and increasing visual hallucinations for approximately a year and a half seeing cats and people that are not there.   At baseline, Tracey [name] is able to walk without a walker and climb stairs. She lives in an independent living facility and has been able to complete all of her ADLs independently. Tracey [name] functional decline was first noticed by family approximately one month ago over Thanksgiving when she struggled with word finding and a decrease in fine motor skills. Her son states she was having trouble using the fork  to feed herself during this time. Over the past 2 weeks her functional ability has reportedly progressively declined with decreased mobility, being unable to climb the stairs in addition to a noticed hypophonia and progressive difficulty with word finding. She also had an episode of getting lost in her facility which is abnormal for her. Her son notes a shuffling gait, increase in her tremor with intention, and when she gets frustrated, a lack of facial expression.   ROS: A 14 point ROS was performed and is negative except as noted in the HPI.   Past Medical History:  Diagnosis Date  . Anxiety   . Arthritis   . Cardiac conduction disorder 03/30/2012   Overview:  STORY: ETT 03/09/2012 Echo 03/07/2012 normal Dr Einar Gip, bradycardia felt due to glaucoma eye drops  . Diverticulosis of colon   . GIST (gastrointestinal stroma tumor), malignant, colon ([place])   . Heart murmur   . History of colon polyps 10/24/2008  . Hypertension   . Major neurocognitive disorder due to Parkinson's disease, possible    Tremors possible parkinsons  . Manic disorder, single episode, in full remission ([place]) 12/01/2009  . Sixth nerve palsy    Family History  Problem Relation Age of Onset  . Congestive Heart Failure Mother   . Dementia Mother   . Heart disease Father   . Diabetes Father   . Lung cancer Son    Social History:   reports that she has never smoked. She has never used smokeless tobacco. She reports that she does not drink alcohol and does not use drugs.  Medications Current Outpatient Medications  Medication Instructions  . ALPRAZolam (XANAX) 0.5 MG tablet TAKE (1) TABLET TWICE DAILY  AS NEEDED FOR ANXIETY.  . celecoxib (CELEBREX) 100 mg, Oral, 2 times daily  . lithium carbonate 300 MG capsule TAKE 1 CAPSULE TWICE DAILY WITH MEALS.  Marland Kitchen. rOPINIRole (REQUIP) 0.5 MG tablet TAKE 1 TABLET FOUR TIMES DAILY WITH MEALS AND AT BEDTIME.   Exam: Current vital signs: BP 100/79 (BP Location: Right Arm)   Pulse (!)  46   Temp 98.6 F (37 C) (Oral)   Resp 18   SpO2 97%  Vital signs in last 24 hours: Temp:  [97.5 F (36.4 C)-98.8 F (37.1 C)] 98.6 F (37 C) (12/30 0848) Pulse Rate:  [42-64] 46 (12/30 0848) Resp:  [11-24] 18 (12/30 0848) BP: (100-178)/(53-93) 100/79 (12/30 0848) SpO2:  [97 %-100 %] 97 % (12/30 0848) Weight:  [68.3 kg] 68.3 kg (12/29 1312)  GENERAL: awake, in no acute distress, subtle tremor of jaw and bilateral upper extremities with resting, increases with intention.  Skin: Seborrheic dermatitis changes to her forehead are noted, a subtle finding.  PSYCH: Affect appropriate, becomes frustrated when attempting word-finding HEENT: - Normocephalic and atraumatic LUNGS - Normal respiratory effort. CV - extremities warm without edema ABDOMEN - Soft, nontender  NEURO:  Mental Status: alert, oriented to self and situation. Not oriented to month or year. She gives a somewhat coherent history of present illness but son supplements when she forgets time-frames and struggles with word-finding. Speech/Language: speech is hypophonic. Struggles frequently with word finding during conversation. Naming and comprehension intact. Becomes frustrated with word finding. Speaks slowly.  Cranial Nerves:  II: PERRL 3 mm/brisk. Visual fields testing with decreased perception on the left. Per chart review, patient has diplopia on the left with correction with prisms. Currently not wearing eyeglasses.  III, IV, VI: EOMI. Lid elevation symmetric and full.  V: Sensation is intact to face, claims the feeling is "kind of different" left to right but is unable to elaborate further. Blinks to threat.  VII: Smile is symmetrical. Able to puff cheeks and raise eyebrows. Hypomimia and decreased blink rate are noted.  VIII: Hearing intact to voice IX, X: Hypophonic. Per son at bedside, her voice on examination is drastically different than at baseline. XI: Normal sternocleidomastoid and trapezius muscle  strength XII: Tongue protrudes midline.   Motor: 5/5 strength is all muscle groups. Grip strength/push/pull equal in UE. Subtle resting tremor that increases with intention in bilateral upper extremities and head. RAM decreased in all extremities. Tone is increased with asymmetric cogwheel rigidity in BUE as well as less prominent cogwheeling in the BLE. Bulk is normal.  Rigidity to passive movement in all 4 extremities in addition to neck rigidity. Bradykinesia present in all 4 extremities. Sensation- Intact to light touch bilaterally in all four extremities. Extinction intact.  Coordination: FTN intact bilaterally. HKS intact bilaterally. Alternating hand movements with incoordination. RAM imprecise and bradykinetic. RAM dysfunction worse with distraction.  DTRs: 2+ bilateral brachioradialis. 1+ bilateral patellae.  Gait- Small steps/hypokinesia and festination noted with use of walker.  Labs I have reviewed labs in epic and the results pertinent to this consultation are: CBC    Component Value Date/Time   WBC 8.3 01/13/2020 0519   RBC 3.87 01/13/2020 0519   HGB 12.2 01/13/2020 0519   HGB 13.0 04/24/2012 0929   HCT 38.9 01/13/2020 0519   HCT 38.6 04/24/2012 0929   PLT 272 01/13/2020 0519   PLT 252 04/24/2012 0929   MCV 100.5 (H) 01/13/2020 0519   MCV 96.9 04/24/2012 0929   MCH 31.5 01/13/2020 0519  MCHC 31.4 01/13/2020 0519   RDW 13.2 01/13/2020 0519   RDW 13.6 04/24/2012 0929   LYMPHSABS 1.5 04/06/2019 1245   LYMPHSABS 1.5 04/24/2012 0929   MONOABS 0.5 04/06/2019 1245   MONOABS 0.4 04/24/2012 0929   EOSABS 0.0 04/06/2019 1245   EOSABS 0.2 04/24/2012 0929   BASOSABS 0.0 04/06/2019 1245   BASOSABS 0.0 04/24/2012 0929    CMP     Component Value Date/Time   NA 143 01/13/2020 0519   NA 142 05/28/2019 0000   NA 141 04/24/2012 0929   K 3.9 01/13/2020 0519   K 4.4 04/24/2012 0929   CL 115 (H) 01/13/2020 0519   CL 109 (H) 04/24/2012 0929   CO2 22 01/13/2020 0519   CO2 23  04/24/2012 0929   GLUCOSE 102 (H) 01/13/2020 0519   GLUCOSE 85 04/24/2012 0929   BUN 20 01/13/2020 0519   BUN 26 (A) 05/28/2019 0000   BUN 20.6 04/24/2012 0929   CREATININE 0.98 01/13/2020 0519   CREATININE 0.91 04/05/2019 0905   CREATININE 0.9 04/24/2012 0929   CALCIUM 9.8 01/13/2020 0519   CALCIUM 10.0 04/24/2012 0929   PROT 7.2 01/12/2020 1610   PROT 6.7 04/24/2012 0929   ALBUMIN 4.2 01/12/2020 1610   ALBUMIN 3.5 04/24/2012 0929   AST 43 (H) 01/12/2020 1610   AST 20 04/24/2012 0929   ALT 23 01/12/2020 1610   ALT 12 04/24/2012 0929   ALKPHOS 85 01/12/2020 1610   ALKPHOS 75 04/24/2012 0929   BILITOT 0.8 01/12/2020 1610   BILITOT 0.36 04/24/2012 0929   GFRNONAA 59 (L) 01/13/2020 0519   GFRNONAA 60 04/05/2019 0905   GFRAA >60 10/01/2019 1139   GFRAA 70 04/05/2019 0905    Lipid Panel     Component Value Date/Time   CHOL 284 (H) 04/05/2019 0905   TRIG 225 (H) 04/05/2019 0905   HDL 73 04/05/2019 0905   CHOLHDL 3.9 04/05/2019 0905   LDLCALC 173 (H) 04/05/2019 0905   Imaging I have reviewed the images obtained: CT-scan of the brain IMPRESSION: Mild diffuse cortical atrophy. Mild chronic ischemic white matter disease. No acute intracranial abnormality seen.  MRI examination of the brain IMPRESSION: No evidence of recent infarction, hemorrhage, or mass. Minor chronic microvascular ischemic changes.  Assessment: 79 year old female with history of Parkinson's Disease presenting with increased confusion, functional decline, headache, and change in speech. Etiology likely progressive Parkinson's Disease. - Brain imaging negative for acute changes with mild chronic microvascular ischemic changes. - Per chart review, outpatient neurology prescribed Sinemet but patient is not currently taking this drug. Her Requip total daily dose was increased by 50% approximately one month ago.  - Patient exam significant for bradykinesia, cogwheel rigidity, and tremor at rest which waxes and  wanes and persists with volitional movement.  - Occipital headache for 3-4 weeks per patient, occasionally endorses increase in headache with palpation but unable to elaborate more on headache quality, aggravating/alleviating factors. May be related to occipital neuralgia.   Recommendations: - As she seems to be getting better off Requip (was not started after she presented to the ED) and seemed to have worsened with the 50% increase in dose approximately 6 weeks ago, will hold for now.  - Trial of carbidopa-levodopa 10-100 TID, first dose now.    Pt seen by NP/Neuro and later by MD.  Anibal Henderson, AGAC-NP [facility] Pager: [phone]  I have seen and examined the patient. I have formulated the assessment and recommendations. 79 year old female with history of Parkinson's  Disease presenting with increased confusion, functional decline, headache, and change in speech. Etiology likely progressive Parkinson's disease. No acute stroke seen on MRI. Multiple exam findings are characteristic of Parkinson's disease and this is less likely to be a Parkinson's plus syndrome. Trial of Sinemet is being initiated.  Electronically signed: Dr. Caryl Pina

## 2020-01-13 NOTE — Progress Notes (Signed)
[facility]  PROGRESS NOTE  Tracey [name] [id number] DOB: [birth year] DOA: 01/12/2020 PCP: [name], MD   Brief HPI:   79 year old female with history of suspected Parkinson disease, hypertension, bradycardia, hypercalcemia, bipolar disorder on lithium, Xanax, GIST of [place] s/p resection, osteoarthritis s/p total knee replacement 10/05/2019 came to ED for evaluation of change in speech, confusion, functional decline, headache.  Patient has been diagnosed with Parkinson disease and she has been taking Requip.  Dose of Requip was changed a month ago by her PCP.  Patient has noticed difficulty finding words, shuffling gait, and worsening of tremors in upper extremities.  Also there has been increased confusion associations.    Subjective    Patient seen and examined, complains of occipital headache.   Assessment/Plan:     1. Functional decline/mental status changes-likely progression of Parkinson disease, patient was not taking Sinemet.  She was only taking Requip at home.  MRI brain is pending.  Will consult neurology for further management of Parkinson disease.  B12 291, TSH 2.734 2. Right lower extremity cellulitis-patient has erythema with tenderness and warmth of right lower extremity.  Will obtain right lower extremity Doppler to rule out DVT.  Start ceftriaxone 1 g IV daily for cellulitis. 3. Hypercalcemia-likely chronic, today calcium is down to 9.8.  PTH is pending. 4. Bipolar disorder-continue lithium, lithium level is 1.0, WNL. 5. Osteoarthritis, s/p total knee arthroplasty on 10/05/2019-stable.  Right lower extremities erythematous and swollen.  Management as above.     COVID-19 Labs  Recent Labs    01/12/20 2122  CRP 0.6    Lab Results  Component Value Date   SARSCOV2NAA NEGATIVE 01/12/2020   [place]lie NEGATIVE 10/01/2019     Scheduled medications:   . aspirin EC  81 mg Oral Daily  . enoxaparin (LOVENOX) injection  40 mg Subcutaneous Q24H   . lithium carbonate  300 mg Oral BID WC         CBG: Recent Labs  Lab 01/12/20 1651  GLUCAP 103*    SpO2: 99 %    CBC: Recent Labs  Lab 01/12/20 1610 01/13/20 0519  WBC 10.2 8.3  HGB 13.1 12.2  HCT 42.2 38.9  MCV 100.7* 100.5*  PLT 326 [id number]    Basic Metabolic Panel: Recent Labs  Lab 01/12/20 1610 01/13/20 0519  NA 141 143  K 4.9 3.9  CL 112* 115*  CO2 22 22  GLUCOSE 105* 102*  BUN 18 20  CREATININE 1.13* 0.98  CALCIUM 10.8* 9.8     Liver Function Tests: Recent Labs  Lab 01/12/20 1610  AST 43*  ALT 23  ALKPHOS 85  BILITOT 0.8  PROT 7.2  ALBUMIN 4.2     Antibiotics: Anti-infectives (From admission, onward)   Start     Dose/Rate Route Frequency Ordered Stop   01/13/20 1100  cefTRIAXone (ROCEPHIN) 1 g in sodium chloride 0.9 % 100 mL IVPB        1 g 200 mL/hr over 30 Minutes Intravenous Daily 01/13/20 1037         DVT prophylaxis: Lovenox  Code Status: DNR  Family Communication: Discussed with patient's son at bedside   Consultants:    Procedures:      Objective   Vitals:   01/13/20 0533 01/13/20 0730 01/13/20 0848 01/13/20 1305  BP:  (!) 150/59 100/79 (!) 157/69  Pulse:  (!) 50 (!) 46 (!) 49  Resp:  (!) 23 18 18   Temp: 98.8 F (37.1 C)  98.6 F (  37 C) 98.3 F (36.8 C)  TempSrc: Oral  Oral Oral  SpO2:  99% 97% 99%    Intake/Output Summary (Last 24 hours) at 01/13/2020 1629 Last data filed at 01/13/2020 1100 Gross per 24 hour  Intake 120 ml  Output --  Net 120 ml    No intake/output data recorded.  There were no vitals filed for this visit.  Physical Examination:    General-appears in no acute distress  Heart-S1-S2, regular, no murmur auscultated  Lungs-clear to auscultation bilaterally, no wheezing or crackles auscultated  Abdomen-soft, nontender, no organomegaly  Extremities-right lower extremity is warm to touch, mildly erythematous, tender to palpation.  Minimal edema noted.  Neuro-alert,  oriented x3, bilateral tremors noted in the upper extremities   Status is: Inpatient  Dispo: The patient is from: Home              Anticipated d/c is to: Home versus skilled nursing facility              Anticipated d/c date is: 01/16/2020              Patient currently not medically stable for discharge  Barrier to discharge-management for Parkinson disease, cellulitis       Data Reviewed:   Recent Results (from the past 240 hour(s))  Resp Panel by RT-PCR (Flu A&B, Covid) Nasopharyngeal Swab     Status: None   Collection Time: 01/12/20  7:26 PM   Specimen: Nasopharyngeal Swab; Nasopharyngeal(NP) swabs in vial transport medium  Result Value Ref Range Status   SARS Coronavirus 2 by RT PCR NEGATIVE NEGATIVE Final    Comment: (NOTE) SARS-CoV-2 target nucleic acids are NOT DETECTED.  The SARS-CoV-2 RNA is generally detectable in upper respiratory specimens during the acute phase of infection. The lowest concentration of SARS-CoV-2 viral copies this assay can detect is 138 copies/mL. A negative result does not preclude SARS-Cov-2 infection and should not be used as the sole basis for treatment or other patient management decisions. A negative result may occur with  improper specimen collection/handling, submission of specimen other than nasopharyngeal swab, presence of viral mutation(s) within the areas targeted by this assay, and inadequate number of viral copies(<138 copies/mL). A negative result must be combined with clinical observations, patient history, and epidemiological information. The expected result is Negative.  Fact Sheet for Patients:  BloggerCourse.com  Fact Sheet for Healthcare Providers:  SeriousBroker.it  This test is no t yet approved or cleared by the [place] FDA and  has been authorized for detection and/or diagnosis of SARS-CoV-2 by FDA under an Emergency Use Authorization (EUA). This EUA will  remain  in effect (meaning this test can be used) for the duration of the COVID-19 declaration under Section 564(b)(1) of the Act, 21 U.S.C.section 360bbb-3(b)(1), unless the authorization is terminated  or revoked sooner.       Influenza A by PCR NEGATIVE NEGATIVE Final   Influenza B by PCR NEGATIVE NEGATIVE Final    Comment: (NOTE) The Xpert Xpress SARS-CoV-2/FLU/RSV plus assay is intended as an aid in the diagnosis of influenza from Nasopharyngeal swab specimens and should not be used as a sole basis for treatment. Nasal washings and aspirates are unacceptable for Xpert Xpress SARS-CoV-2/FLU/RSV testing.  Fact Sheet for Patients: BloggerCourse.com  Fact Sheet for Healthcare Providers: SeriousBroker.it  This test is not yet approved or cleared by the [place] FDA and has been authorized for detection and/or diagnosis of SARS-CoV-2 by FDA under an Emergency Use Authorization (EUA). This  EUA will remain in effect (meaning this test can be used) for the duration of the COVID-19 declaration under Section 564(b)(1) of the Act, 21 U.S.C. section 360bbb-3(b)(1), unless the authorization is terminated or revoked.  Performed at [facility], [address] [place], [place] [address]     No results for input(s): LIPASE, AMYLASE in the last 168 hours. No results for input(s): AMMONIA in the last 168 hours.  Cardiac Enzymes: No results for input(s): CKTOTAL, CKMB, CKMBINDEX, TROPONINI in the last 168 hours. BNP (last 3 results) No results for input(s): BNP in the last 8760 hours.  ProBNP (last 3 results) No results for input(s): PROBNP in the last 8760 hours.  Studies:  CT Head Wo Contrast  Result Date: 01/12/2020 CLINICAL DATA:  Altered mental status.  Headache. EXAM: CT HEAD WITHOUT CONTRAST TECHNIQUE: Contiguous axial images were obtained from the base of the skull through the vertex without  intravenous contrast. COMPARISON:  None. FINDINGS: Brain: Mild diffuse cortical atrophy is noted. Mild chronic ischemic white matter disease is noted. No mass effect or midline shift is noted. Ventricular size is within normal limits. There is no evidence of mass lesion, hemorrhage or acute infarction. Vascular: No hyperdense vessel or unexpected calcification. Skull: Normal. Negative for fracture or focal lesion. Sinuses/Orbits: No acute finding. Other: None. IMPRESSION: Mild diffuse cortical atrophy. Mild chronic ischemic white matter disease. No acute intracranial abnormality seen. Electronically Signed   By: Lupita Raider M.D.   On: 01/12/2020 19:44   MR BRAIN WO CONTRAST  Result Date: 01/13/2020 CLINICAL DATA:  Mental status change EXAM: MRI HEAD WITHOUT CONTRAST TECHNIQUE: Multiplanar, multiecho pulse sequences of the brain and surrounding structures were obtained without intravenous contrast. COMPARISON:  None. FINDINGS: Brain: There is no acute infarction or intracranial hemorrhage. There is no intracranial mass, mass effect, or edema. There is no hydrocephalus or extra-axial fluid collection. Prominence of the ventricles and sulci reflects generalized parenchymal volume loss. Patchy foci of T2 hyperintensity in the supratentorial white matter are nonspecific but may reflect minor chronic microvascular ischemic changes. Vascular: Major vessel flow voids at the skull base are preserved. Skull and upper cervical spine: Normal marrow signal is preserved. Sinuses/Orbits: Left maxillary sinus retention cyst. Trace ethmoid mucosal thickening. Bilateral lens replacements. Other: Sella is unremarkable. Patchy right greater than left mastoid fluid opacification. IMPRESSION: No evidence of recent infarction, hemorrhage, or mass. Minor chronic microvascular ischemic changes. Electronically Signed   By: Guadlupe Spanish M.D.   On: 01/13/2020 11:49   VAS [place] LOWER EXTREMITY VENOUS (DVT)  Result Date: 01/13/2020   Lower Venous DVT Study Indications: Asymmetrical swelling of RT lower extremity.  Risk Factors: Surgery 10-05-2019 RT total knee arthroplasty. Comparison Study: No prior studies. Performing Technologist: Jean Rosenthal, RDMS  Examination Guidelines: A complete evaluation includes B-mode imaging, spectral Doppler, color Doppler, and power Doppler as needed of all accessible portions of each vessel. Bilateral testing is considered an integral part of a complete examination. Limited examinations for reoccurring indications may be performed as noted. The reflux portion of the exam is performed with the patient in reverse Trendelenburg.  +---------+---------------+---------+-----------+----------+--------------+ RIGHT    CompressibilityPhasicitySpontaneityPropertiesThrombus Aging +---------+---------------+---------+-----------+----------+--------------+ CFV      Full           Yes      Yes                                 +---------+---------------+---------+-----------+----------+--------------+  SFJ      Full                                                        +---------+---------------+---------+-----------+----------+--------------+ FV Prox  Full                                                        +---------+---------------+---------+-----------+----------+--------------+ FV Mid   Full                                                        +---------+---------------+---------+-----------+----------+--------------+ FV DistalFull                                                        +---------+---------------+---------+-----------+----------+--------------+ PFV      Full                                                        +---------+---------------+---------+-----------+----------+--------------+ POP      Full           Yes      Yes                                 +---------+---------------+---------+-----------+----------+--------------+ PTV      Full                                                         +---------+---------------+---------+-----------+----------+--------------+ PERO     Full                                                        +---------+---------------+---------+-----------+----------+--------------+   +----+---------------+---------+-----------+----------+--------------+ LEFTCompressibilityPhasicitySpontaneityPropertiesThrombus Aging +----+---------------+---------+-----------+----------+--------------+ CFV Full           Yes      Yes                                 +----+---------------+---------+-----------+----------+--------------+     Summary: RIGHT: - There is no evidence of deep vein thrombosis in the lower extremity.  - No cystic structure found in the popliteal fossa.  LEFT: - No evidence of common femoral vein obstruction.  *See table(s) above for measurements and observations.    Preliminary  Oswald Hillock   [facility] If 7PM-7AM, please contact night-coverage at [email or web address], Office  [phone]   01/13/2020, 4:29 PM  LOS: 0 days

## 2020-01-13 NOTE — Evaluation (Signed)
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   Clinical Impression   [facility] and per chart review was mod I with ADLs, ambulating with rollator outside the house up until ~2 weeks ago. Patient began needing increased assistance with mobility, self care due to confusion and increased pain primarily in neck. Currently patient set up-supervision for UB ADL, min A for LB ADL and min A for functional transfers using rolling walker. Patient require mod cues for body mechanics, was disoriented to time and tangential. Recommend continued acute OT services to maximize patient safety and independence with self care in order to facilitate D/C to venue listed below.    Follow Up Recommendations  Home health OT;Supervision/Assistance - 24 hour;Other (comment) (vs SNF if unable 24/7)    Equipment Recommendations  None recommended by OT       Precautions / Restrictions Precautions Precautions: Fall Restrictions Weight Bearing Restrictions: No      Mobility Bed Mobility Overal bed mobility: Needs Assistance Bed Mobility: Supine to Sit;Sit to Supine     Supine to sit: Min assist;HOB elevated Sit to supine: Min assist   General bed mobility comments: increased time to initiate bed mobility, min A for safety    Transfers Overall transfer level: Needs assistance Equipment used: Rolling walker (2 wheeled) Transfers: Sit to/from Stand Sit to Stand: Min assist         General transfer comment: please see toilet transfer in ADL section    Balance Overall balance  assessment: Needs assistance Sitting-balance support: Feet supported Sitting balance-Leahy Scale: Fair     Standing balance support: Single extremity supported;Bilateral upper extremity supported Standing balance-Leahy Scale: Poor Standing balance comment: reliant on at least 1 UE support                           ADL either performed or assessed with clinical judgement   ADL Overall ADL's : Needs assistance/impaired     Grooming: Wash/dry hands;Set up;Sitting   Upper Body Bathing: Sitting;Supervision/ safety;Set up   Lower Body Bathing: Minimal assistance;Sitting/lateral leans;Sit to/from stand Lower Body Bathing Details (indicate cue type and reason): patient able to wash L upper leg, lower leg and part of foot seated on EOB after incontinent of urine in standing. min A for safety due to patient leaning far forward Upper Body Dressing : Supervision/safety;Set up;Sitting   Lower Body Dressing: Minimal assistance;Sitting/lateral leans   Toilet Transfer: Minimal assistance;Cueing for safety;Cueing for sequencing;Ambulation;BSC;RW Toilet Transfer Details (indicate cue type and reason): cues for safe hand placement, difficulty sequencing started ambulating towards chair after using BSC despite cues to go back to EOB Toileting- Clothing Manipulation and Hygiene: Minimal assistance;Sit to/from stand Toileting - Clothing Manipulation Details (indicate cue type and reason): for balance     Functional mobility during ADLs: Minimal assistance;Cueing for safety;Cueing for sequencing;Rolling walker General ADL Comments: patient currently high fall risk due to decreased safety awareness, balance, requiring cues for body mechanics and sequencing.                  Pertinent Vitals/Pain Pain Assessment: 0-10  Pain Score: 6  Pain Location: neck Pain Descriptors / Indicators: Sore;Aching Pain Intervention(s): Heat applied;Repositioned     Hand Dominance Right    Extremity/Trunk Assessment Upper Extremity Assessment Upper Extremity Assessment: Generalized weakness   Lower Extremity Assessment Lower Extremity Assessment: Defer to PT evaluation       Communication Communication Communication: Other (comment) (low phonation)   Cognition Arousal/Alertness: Awake/alert Behavior During Therapy: WFL for tasks assessed/performed Overall Cognitive Status: Impaired/Different from baseline Area of Impairment: Orientation;Memory;Following commands;Problem solving                 Orientation Level: Disoriented to;Time   Memory: Decreased short-term memory Following Commands: Follows one step commands with increased time     Problem Solving: Slow processing;Requires verbal cues General Comments: pt unable to recall month/year, requires increased time for processing, distractible and tangential              Home Living Family/patient expects to be discharged to:: Other (Comment) ([facility])                                        Prior Functioning/Environment Level of Independence: Independent with assistive device(s)        Comments: per chart review up until 2 weeks ago patient was mod I with self care and using rollator for outdoor mobility.        OT Problem List: Decreased strength;Decreased activity tolerance;Impaired balance (sitting and/or standing);Decreased cognition;Decreased safety awareness;Pain      OT Treatment/Interventions: Self-care/ADL training;DME and/or AE instruction;Therapeutic activities;Cognitive remediation/compensation;Patient/family education;Balance training    OT Goals(Current goals can be found in the care plan section) Acute Rehab OT Goals Patient Stated Goal: "my neck hurts" OT Goal Formulation: With patient Time For Goal Achievement: 01/27/20 Potential to Achieve Goals: Good  OT Frequency: Min 2X/week    AM-PAC OT "6 Clicks" Daily Activity     Outcome Measure Help from  another person eating meals?: A Little Help from another person taking care of personal grooming?: A Little Help from another person toileting, which includes using toliet, bedpan, or urinal?: A Little Help from another person bathing (including washing, rinsing, drying)?: A Little Help from another person to put on and taking off regular upper body clothing?: A Little Help from another person to put on and taking off regular lower body clothing?: A Little 6 Click Score: 18   End of Session Equipment Utilized During Treatment: Gait belt;Rolling walker Nurse Communication: Mobility status  Activity Tolerance: Patient tolerated treatment well Patient left: in bed;with call bell/phone within reach;with bed alarm set;with family/visitor present  OT Visit Diagnosis: Other abnormalities of gait and mobility (R26.89);Unsteadiness on feet (R26.81);Pain Pain - part of body:  (neck)                Time: 1610-9604 OT Time Calculation (min): 38 min Charges:  OT General Charges $OT Visit: 1 Visit OT Evaluation $OT Eval Low Complexity: 1 Low OT Treatments $Self Care/Home Management : 8-22 mins  Marlyce Huge OT OT pager: [phone]  Carmelia Roller 01/13/2020, 11:51 AM

## 2020-01-13 NOTE — Progress Notes (Signed)
Spoke with Carlena Sax RN, patient is requesting her son accompany her to MRI to assist in screening and support. Carlena Sax is calling son to come in now and when he arrives we will get the patient.

## 2020-01-13 NOTE — Progress Notes (Signed)
Lower extremity venous RT study completed.   Please see CV Proc for preliminary results.   Sesar Madewell, RDMS  

## 2020-01-14 DIAGNOSIS — Z833 Family history of diabetes mellitus: Secondary | ICD-10-CM | POA: Diagnosis not present

## 2020-01-14 DIAGNOSIS — G20A1 Parkinson's disease without dyskinesia, without mention of fluctuations: Secondary | ICD-10-CM | POA: Diagnosis present

## 2020-01-14 DIAGNOSIS — Z66 Do not resuscitate: Secondary | ICD-10-CM | POA: Diagnosis not present

## 2020-01-14 DIAGNOSIS — Z85831 Personal history of malignant neoplasm of soft tissue: Secondary | ICD-10-CM | POA: Diagnosis not present

## 2020-01-14 DIAGNOSIS — L03115 Cellulitis of right lower limb: Secondary | ICD-10-CM | POA: Diagnosis not present

## 2020-01-14 DIAGNOSIS — Z888 Allergy status to other drugs, medicaments and biological substances status: Secondary | ICD-10-CM | POA: Diagnosis not present

## 2020-01-14 DIAGNOSIS — F304 Manic episode in full remission: Secondary | ICD-10-CM | POA: Diagnosis not present

## 2020-01-14 DIAGNOSIS — I10 Essential (primary) hypertension: Secondary | ICD-10-CM | POA: Diagnosis not present

## 2020-01-14 DIAGNOSIS — Z880 Allergy status to penicillin: Secondary | ICD-10-CM | POA: Diagnosis not present

## 2020-01-14 DIAGNOSIS — Z79899 Other long term (current) drug therapy: Secondary | ICD-10-CM | POA: Diagnosis not present

## 2020-01-14 DIAGNOSIS — Z20822 Contact with and (suspected) exposure to covid-19: Secondary | ICD-10-CM | POA: Diagnosis not present

## 2020-01-14 DIAGNOSIS — Z885 Allergy status to narcotic agent status: Secondary | ICD-10-CM | POA: Diagnosis not present

## 2020-01-14 DIAGNOSIS — Z8249 Family history of ischemic heart disease and other diseases of the circulatory system: Secondary | ICD-10-CM | POA: Diagnosis not present

## 2020-01-14 DIAGNOSIS — H532 Diplopia: Secondary | ICD-10-CM | POA: Diagnosis not present

## 2020-01-14 DIAGNOSIS — Z90710 Acquired absence of both cervix and uterus: Secondary | ICD-10-CM | POA: Diagnosis not present

## 2020-01-14 DIAGNOSIS — Z801 Family history of malignant neoplasm of trachea, bronchus and lung: Secondary | ICD-10-CM | POA: Diagnosis not present

## 2020-01-14 DIAGNOSIS — G20 Parkinson's disease: Secondary | ICD-10-CM | POA: Diagnosis not present

## 2020-01-14 DIAGNOSIS — Z86010 Personal history of colonic polyps: Secondary | ICD-10-CM | POA: Diagnosis not present

## 2020-01-14 DIAGNOSIS — Z96651 Presence of right artificial knee joint: Secondary | ICD-10-CM | POA: Diagnosis not present

## 2020-01-14 DIAGNOSIS — Z90722 Acquired absence of ovaries, bilateral: Secondary | ICD-10-CM | POA: Diagnosis not present

## 2020-01-14 DIAGNOSIS — R4182 Altered mental status, unspecified: Secondary | ICD-10-CM | POA: Diagnosis present

## 2020-01-14 DIAGNOSIS — E785 Hyperlipidemia, unspecified: Secondary | ICD-10-CM | POA: Diagnosis not present

## 2020-01-14 DIAGNOSIS — F3174 Bipolar disorder, in full remission, most recent episode manic: Secondary | ICD-10-CM | POA: Diagnosis not present

## 2020-01-14 DIAGNOSIS — R001 Bradycardia, unspecified: Secondary | ICD-10-CM | POA: Diagnosis not present

## 2020-01-14 DIAGNOSIS — F419 Anxiety disorder, unspecified: Secondary | ICD-10-CM | POA: Diagnosis not present

## 2020-01-14 DIAGNOSIS — R519 Headache, unspecified: Secondary | ICD-10-CM | POA: Diagnosis not present

## 2020-01-14 LAB — PTH, INTACT AND CALCIUM
Calcium, Total (PTH): 10.5 mg/dL — ABNORMAL HIGH (ref 8.7–10.3)
PTH: 79 pg/mL — ABNORMAL HIGH (ref 15–65)

## 2020-01-14 MED ORDER — PHENOL 1.4 % MT LIQD
1.0000 | OROMUCOSAL | Status: DC | PRN
  Administered 2020-01-14 (×2): 1 via OROMUCOSAL
  Filled 2020-01-14: qty 177

## 2020-01-14 NOTE — NC FL2 (Signed)
[place] LEVEL OF CARE SCREENING TOOL     IDENTIFICATION  Patient Name: Tracey [name] Birthdate: November 07, 1940 Sex: female Admission Date (Current Location): 01/12/2020  [facility] and [place] Number:  Herbalist and Address:  [facility],  [place], [place]      Provider Number: [id number]  Attending Physician Name and Address:  Oswald Hillock, MD  Relative Name and Phone Number:  [name] "[name]" son [phone]    Current Level of Care: Hospital Recommended Level of Care: [place] Prior Approval Number:    Date Approved/Denied:   PASRR Number:    Discharge Plan: SNF    Current Diagnoses: Patient Active Problem List   Diagnosis Date Noted  . Confusion 01/12/2020  . Headache 01/12/2020  . Altered mental state 01/12/2020  . Speech abnormality 01/12/2020  . Edema 07/01/2019  . Right knee pain 06/17/2019  . Hypercalcemia 06/17/2019  . Slow transit constipation 05/31/2019  . Bradycardia 05/31/2019  . History of shingles 04/22/2019  . Upper back pain on right side 04/01/2019  . DOE (dyspnea on exertion) 03/25/2018  . Cough variant asthma 03/24/2018  . Parkinsonism ([place]) 11/15/2015  . Diplopia 10/16/2015  . SBO (small bowel obstruction) ([place]) 09/18/2013  . Glaucoma 09/18/2013  . GERD (gastroesophageal reflux disease) 09/18/2013  . HLD (hyperlipidemia) 02/21/2013  . Essential (primary) hypertension 02/21/2013  . Barrett esophagus 02/19/2013  . Cardiac conduction disorder 03/30/2012  . Difficulty hearing 08/20/2011  . 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 Allergic rhinitis 09/14/2010  . Anxiety state 12/22/2009  . Bipolar I disorder, single manic episode, in full remission ([place]) 12/01/2009  . 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 History of colon polyps 10/24/2008  . Arthritis, degenerative 10/24/2008     Orientation RESPIRATION BLADDER Height & Weight     Self,Situation,Place  Normal Continent Weight:   Height:     BEHAVIORAL SYMPTOMS/MOOD NEUROLOGICAL BOWEL NUTRITION STATUS      Continent Diet (Regular)  AMBULATORY STATUS COMMUNICATION OF NEEDS Skin   Extensive Assist   Normal                       Personal Care Assistance Level of Assistance  Bathing,Feeding,Dressing Bathing Assistance: Maximum assistance Feeding assistance: Maximum assistance Dressing Assistance: Maximum assistance     Functional Limitations Info  Sight,Hearing,Speech Sight Info: Adequate Hearing Info: Adequate Speech Info: Adequate    SPECIAL CARE FACTORS FREQUENCY  PT (By licensed PT),OT (By licensed OT)     PT Frequency: x5 week OT Frequency: x5 week            Contractures Contractures Info: Not present    Additional Factors Info  Code Status,Allergies Code Status Info: DNR Allergies Info: Lisinopril, Penicillins, Adhesive Tape, Atorvastatin, Dilaudid Hydromorphone Hcl, Hydromorphone           Current Medications (01/14/2020):  This is the current hospital active medication list Current Facility-Administered Medications  Medication Dose Route Frequency Provider Last Rate Last Admin  . acetaminophen (TYLENOL) tablet 650 mg  650 mg Oral Q6H PRN Lenore Cordia, MD   650 mg at 01/14/20 1131   Or  . acetaminophen (TYLENOL) suppository 650 mg  650 mg Rectal Q6H PRN Lenore Cordia, MD      . ALPRAZolam Duanne Moron) tablet 0.5 mg  0.5 mg Oral BID PRN Lenore Cordia, MD      .  aspirin EC tablet 81 mg  81 mg Oral Daily Charlsie Quest, MD   81 mg at 01/14/20 9675  . carbidopa-levodopa (SINEMET IR) 10-100 MG per tablet immediate release 1 tablet  1 tablet Oral TID Caryl Pina, MD   1 tablet at 01/14/20 [phone]  . cefTRIAXone (ROCEPHIN) 1 g in sodium chloride 0.9 % 100 mL IVPB  1 g Intravenous Daily Meredeth Ide, MD 200 mL/hr at 01/14/20 0833 1 g at 01/14/20 0833  . enoxaparin (LOVENOX)  injection 40 mg  40 mg Subcutaneous Q24H Darreld Mclean R, MD   40 mg at 01/13/20 2315  . lithium carbonate capsule 300 mg  300 mg Oral BID WC Darreld Mclean R, MD   300 mg at 01/14/20 0819  . ondansetron (ZOFRAN) tablet 4 mg  4 mg Oral Q6H PRN Charlsie Quest, MD       Or  . ondansetron (ZOFRAN) injection 4 mg  4 mg Intravenous Q6H PRN Charlsie Quest, MD         Discharge Medications: Please see discharge summary for a list of discharge medications.  Relevant Imaging Results:  Relevant Lab Results:   Additional Information SS [id number]  Geni Bers, RN

## 2020-01-14 NOTE — Progress Notes (Signed)
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  Patient has been diagnosed with Parkinson disease and she has been taking Requip.  Dose of Requip was changed a month ago by her PCP.  Patient has noticed difficulty finding words, shuffling gait, and worsening of tremors in upper extremities.  Also there has been increased confusion associations.    Subjective   Patient seen and examined, somewhat improved today.  She is more conversant.  Right leg erythema and pain has improved after starting antibiotics.   Assessment/Plan:     1. Functional decline/mental status changes-likely progression of Parkinson disease, patient was not taking Sinemet.  She was only taking Requip at home.  MRI brain showed no acute stroke.  Neurology was consulted, patient started on Sinemet 10/100 1 tablet p.o. 3 times daily.  Requip is currently on hold.   B12 291, TSH 2.734 2. Right lower extremity cellulitis-patient has erythema with tenderness and warmth of right lower extremity.  Patient was started on ceftriaxone for cellulitis.  Significantly improved.  Right lower extremity Doppler obtained is negative for DVT.  Continue IV ceftriaxone. 3. Hypercalcemia-likely chronic, today calcium is down to 9.8.  PTH is mildly elevated at 79.  She will need endocrinology evaluation as outpatient to rule out FHH.  Serum calcium has improved to 9.8. 4. Bipolar disorder-continue lithium, lithium level is 1.0, WNL. 5. Osteoarthritis, s/p total knee arthroplasty on 10/05/2019-stable.  Right lower extremities erythematous and swollen.   Management as above.     COVID-19 Labs  Recent Labs    01/12/20 2122  CRP 0.6    Lab Results  Component Value Date   SARSCOV2NAA NEGATIVE 01/12/2020   SARSCOV2NAA NEGATIVE 10/01/2019     Scheduled medications:   . aspirin EC  81 mg Oral Daily  . carbidopa-levodopa  1 tablet Oral TID  . enoxaparin (LOVENOX) injection  40 mg Subcutaneous Q24H  . lithium carbonate  300 mg Oral BID WC         CBG: Recent Labs  Lab 01/12/20 1651  GLUCAP 103*    SpO2: 98 %    CBC: Recent Labs  Lab 01/12/20 1610 01/13/20 0519  WBC 10.2 8.3  HGB 13.1 12.2  HCT 42.2 38.9  MCV 100.7* 100.5*  PLT 326 272    Basic Metabolic Panel: Recent Labs  Lab 01/12/20 1610 01/12/20 2123 01/13/20 0519  NA 141  --  143  K 4.9  --  3.9  CL 112*  --  115*  CO2 22  --  22  GLUCOSE 105*  --  102*  BUN 18  --  20  CREATININE 1.13*  --  0.98  CALCIUM 10.8* 10.5* 9.8     Liver Function Tests: Recent Labs  Lab 01/12/20 1610  AST 43*  ALT 23  ALKPHOS 85  BILITOT 0.8  PROT 7.2  ALBUMIN 4.2     Antibiotics: Anti-infectives (From admission, onward)   Start     Dose/Rate Route Frequency Ordered Stop   01/13/20 1100  cefTRIAXone (ROCEPHIN) 1 g in sodium chloride 0.9 % 100 mL IVPB        1 g 200 mL/hr over 30 Minutes  Intravenous Daily 01/13/20 1037         DVT prophylaxis: Lovenox  Code Status: DNR  Family Communication: Discussed with patient's son at bedside   Consultants:  Neurology  Procedures:      Objective   Vitals:   01/13/20 1953 01/14/20 0418 01/14/20 1241 01/14/20 1358  BP: (!) 147/70 (!) 158/62 (!) 169/78 (!) 144/69  Pulse: (!) 52 (!) 51 (!) 50 (!) 51  Resp: 18 20 18    Temp: 98.4 F (36.9 C) 98.2 F (36.8 C) 97.7 F (36.5 C)   TempSrc: Oral Oral Oral   SpO2: 99% 99% 98%     Intake/Output Summary (Last 24 hours) at 01/14/2020 1409 Last data filed at 01/14/2020 1300 Gross per 24 hour  Intake 336.06 ml  Output 550 ml  Net -213.94 ml     12/29 1901 - 12/31 0700 In: 216.1 [P.O.:120] Out: 550 [Urine:550]  There were no vitals filed for this visit.  Physical Examination:    General-appears in no acute distress  Heart-S1-S2, regular, no murmur auscultated  Lungs-clear to auscultation bilaterally, no wheezing or crackles auscultated  Abdomen-soft, nontender, no organomegaly  Extremities-no edema in the lower extremities  Neuro-alert, oriented x3, no focal deficit noted   Status is: Inpatient  Dispo: The patient is from: Home              Anticipated d/c is to: Home versus skilled nursing facility              Anticipated d/c date is: 01/16/2020              Patient currently not medically stable for discharge  Barrier to discharge-management for Parkinson disease, cellulitis       Data Reviewed:   Recent Results (from the past 240 hour(s))  Resp Panel by RT-PCR (Flu A&B, Covid) Nasopharyngeal Swab     Status: None   Collection Time: 01/12/20  7:26 PM   Specimen: Nasopharyngeal Swab; Nasopharyngeal(NP) swabs in vial transport medium  Result Value Ref Range Status   SARS Coronavirus 2 by RT PCR NEGATIVE NEGATIVE Final    Comment: (NOTE) SARS-CoV-2 target nucleic acids are NOT DETECTED.  The SARS-CoV-2 RNA is generally detectable in upper respiratory specimens during the acute phase of infection. The lowest concentration of SARS-CoV-2 viral copies this assay can detect is 138 copies/mL. A negative result does not preclude SARS-Cov-2 infection and should not be used as the sole basis for treatment or other patient management decisions. A negative result may occur with  improper specimen collection/handling, submission of specimen other than nasopharyngeal swab, presence of viral mutation(s) within the areas targeted by this assay, and inadequate number of viral copies(<138 copies/mL). A negative result must be combined with clinical observations, patient history, and epidemiological information.  The expected result is Negative.  Fact Sheet for Patients:  EntrepreneurPulse.com.au  Fact Sheet for Healthcare Providers:  IncredibleEmployment.be  This test is no t yet approved or cleared by the [place] FDA and  has been authorized for detection and/or diagnosis of SARS-CoV-2 by FDA under an Emergency Use Authorization (EUA). This EUA will remain  in effect (meaning this test can be used) for the duration of the COVID-19 declaration under Section 564(b)(1) of the Act, 21 U.S.C.section 360bbb-3(b)(1), unless the authorization is terminated  or revoked sooner.       Influenza A by PCR NEGATIVE NEGATIVE Final   Influenza B by PCR NEGATIVE NEGATIVE Final    Comment: (NOTE) The Xpert Xpress SARS-CoV-2/FLU/RSV  plus assay is intended as an aid in the diagnosis of influenza from Nasopharyngeal swab specimens and should not be used as a sole basis for treatment. Nasal washings and aspirates are unacceptable for Xpert Xpress SARS-CoV-2/FLU/RSV testing.  Fact Sheet for Patients: BloggerCourse.com  Fact Sheet for Healthcare Providers: SeriousBroker.it  This test is not yet approved or cleared by the [place] FDA and has been authorized for detection and/or diagnosis of SARS-CoV-2 by FDA under an Emergency Use Authorization (EUA). This EUA will remain in effect (meaning this test can be used) for the duration of the COVID-19 declaration under Section 564(b)(1) of the Act, 21 U.S.C. section 360bbb-3(b)(1), unless the authorization is terminated or revoked.  Performed at [facility], [address], [place], [place] [address]   Culture, blood (routine x 2)     Status: None (Preliminary result)   Collection Time: 01/13/20 12:53 PM   Specimen: BLOOD  Result Value Ref Range Status   Specimen Description   Final    BLOOD RIGHT ANTECUBITAL Performed at [facility], [address], [place], [place] [address]    Special Requests   Final    BOTTLES DRAWN AEROBIC AND ANAEROBIC Blood Culture adequate volume Performed at [facility], [address] [place], [place] [address]    Culture   Final    NO GROWTH < 24 HOURS Performed at [facility], [address] [place], [place] [address]    Report Status PENDING  Incomplete  Culture, blood (routine x 2)     Status: None (Preliminary result)   Collection Time: 01/13/20 12:53 PM   Specimen: Site Not Specified; Blood  Result Value Ref Range Status   Specimen Description   Final    SITE NOT SPECIFIED Performed at [facility], [address], [place], [place] [address]    Special Requests   Final    BOTTLES DRAWN AEROBIC ONLY Blood Culture results may not be optimal due to an inadequate volume of blood received in culture bottles Performed at [facility], [address], [place], [place] [address]    Culture   Final    NO GROWTH < 24 HOURS Performed at [facility], [address], [place], [place] [address]    Report Status PENDING  Incomplete    No results for input(s): LIPASE, AMYLASE in the last 168 hours. No results for input(s): AMMONIA in the last 168 hours.  Cardiac Enzymes: No results for input(s): CKTOTAL, CKMB, CKMBINDEX, TROPONINI in the last 168 hours. BNP (last 3 results) No results for input(s): BNP in the last 8760 hours.  ProBNP (last 3 results) No results for input(s): PROBNP in the last 8760 hours.  Studies:  CT Head Wo Contrast  Result Date: 01/12/2020 CLINICAL DATA:  Altered mental status.  Headache. EXAM: CT HEAD WITHOUT CONTRAST TECHNIQUE: Contiguous axial images were obtained from the base of the skull through the vertex without intravenous contrast. COMPARISON:  None. FINDINGS: Brain: Mild diffuse cortical atrophy is noted. Mild chronic ischemic white matter disease is noted. No  mass effect or midline shift is noted. Ventricular size is within normal limits. There is no evidence of mass lesion, hemorrhage or acute infarction. Vascular: No hyperdense vessel or unexpected calcification. Skull: Normal. Negative for fracture or focal lesion. Sinuses/Orbits: No acute finding. Other: None. IMPRESSION: Mild diffuse cortical atrophy. Mild chronic ischemic white matter disease. No acute intracranial abnormality seen. Electronically Signed  By: Marijo Conception M.D.   On: 01/12/2020 19:44   MR BRAIN WO CONTRAST  Result Date: 01/13/2020 CLINICAL DATA:  Mental status change EXAM: MRI HEAD WITHOUT CONTRAST TECHNIQUE: Multiplanar, multiecho pulse sequences of the brain and surrounding structures were obtained without intravenous contrast. COMPARISON:  None. FINDINGS: Brain: There is no acute infarction or intracranial hemorrhage. There is no intracranial mass, mass effect, or edema. There is no hydrocephalus or extra-axial fluid collection. Prominence of the ventricles and sulci reflects generalized parenchymal volume loss. Patchy foci of T2 hyperintensity in the supratentorial white matter are nonspecific but may reflect minor chronic microvascular ischemic changes. Vascular: Major vessel flow voids at the skull base are preserved. Skull and upper cervical spine: Normal marrow signal is preserved. Sinuses/Orbits: Left maxillary sinus retention cyst. Trace ethmoid mucosal thickening. Bilateral lens replacements. Other: Sella is unremarkable. Patchy right greater than left mastoid fluid opacification. IMPRESSION: No evidence of recent infarction, hemorrhage, or mass. Minor chronic microvascular ischemic changes. Electronically Signed   By: Macy Mis M.D.   On: 01/13/2020 11:49   VAS [place] LOWER EXTREMITY VENOUS (DVT)  Result Date: 01/13/2020  Lower Venous DVT Study Indications: Asymmetrical swelling of RT lower extremity.  Risk Factors: Surgery 10-05-2019 RT total knee arthroplasty. Comparison  Study: No prior studies. Performing Technologist: Darlin Coco, RDMS  Examination Guidelines: A complete evaluation includes B-mode imaging, spectral Doppler, color Doppler, and power Doppler as needed of all accessible portions of each vessel. Bilateral testing is considered an integral part of a complete examination. Limited examinations for reoccurring indications may be performed as noted. The reflux portion of the exam is performed with the patient in reverse Trendelenburg.  +---------+---------------+---------+-----------+----------+--------------+ RIGHT    CompressibilityPhasicitySpontaneityPropertiesThrombus Aging +---------+---------------+---------+-----------+----------+--------------+ CFV      Full           Yes      Yes                                 +---------+---------------+---------+-----------+----------+--------------+ SFJ      Full                                                        +---------+---------------+---------+-----------+----------+--------------+ FV Prox  Full                                                        +---------+---------------+---------+-----------+----------+--------------+ FV Mid   Full                                                        +---------+---------------+---------+-----------+----------+--------------+ FV DistalFull                                                        +---------+---------------+---------+-----------+----------+--------------+ PFV  Full                                                        +---------+---------------+---------+-----------+----------+--------------+ POP      Full           Yes      Yes                                 +---------+---------------+---------+-----------+----------+--------------+ PTV      Full                                                        +---------+---------------+---------+-----------+----------+--------------+ PERO     Full                                                         +---------+---------------+---------+-----------+----------+--------------+   +----+---------------+---------+-----------+----------+--------------+ LEFTCompressibilityPhasicitySpontaneityPropertiesThrombus Aging +----+---------------+---------+-----------+----------+--------------+ CFV Full           Yes      Yes                                 +----+---------------+---------+-----------+----------+--------------+     Summary: RIGHT: - There is no evidence of deep vein thrombosis in the lower extremity.  - No cystic structure found in the popliteal fossa.  LEFT: - No evidence of common femoral vein obstruction.  *See table(s) above for measurements and observations. Electronically signed by Servando Snare MD on 01/13/2020 at 8:20:37 PM.    Final        Oswald Hillock   [facility] If 7PM-7AM, please contact night-coverage at [email or web address], Office  [phone]   01/14/2020, 2:09 PM  LOS: 0 days

## 2020-01-14 NOTE — Clinical Social Work Note (Deleted)
30 Day Passar Note  RE: Tracey [name]        Date of Birth: April 23, 1942__   Date: 01-14-20       To Whom It May Concern:  Please be advised that the above-named patient will require a short-term nursing home stay - anticipated 30 days or less for rehabilitation and strengthening.  The plan is for return home.   Windell Moulding, MSW, [place] [phone]

## 2020-01-14 NOTE — TOC Initial Note (Signed)
Transition of Care [facility]) - Initial/Assessment Note    Patient Details  Name: Tracey [name] MRN: [id number] Date of Birth: Jan 20, 1940  [facility] of [facility]) CM/SW Contact:    Darleene Cleaver, LCSW Phone Number: 01/14/2020, 2:42 PM  Clinical Narrative:                  Patient is a 79 year old female from [facility].  Patient will be going to [facility] for short term rehab.  CSW spoke to Jackelyn Poling at [facility] and they can accept her pending insurance authorization.  CSW attempted to start insurance authorization however they are closed today and tomorrow.  CSW will try to start insurance auth at a later time.  CSW also spoke to [facility] they will try to start insurance auth through the insurance portal.  CSW to continue to follow patient's progress throughout discharge planning.  Patient is a level 2 passar screen, clinicals faxed to [facility].  Expected Discharge Plan: Skilled Nursing Facility Barriers to Discharge: Insurance Authorization,Continued Medical Work up   Patient Goals and CMS Choice Patient states their goals for this hospitalization and ongoing recovery are:: To go to [facility] SNF then return to Independent Living. CMS Medicare.gov Compare Post Acute Care list provided to:: Patient Choice offered to / list presented to : Patient  Expected Discharge Plan and Services Expected Discharge Plan: Skilled Nursing Facility     Post Acute Care Choice: Skilled Nursing Facility Living arrangements for the past 2 months: Independent Living Facility                                      Prior Living Arrangements/Services Living arrangements for the past 2 months: Independent Living Facility Lives with:: Self Patient language and need for interpreter reviewed:: Yes Do you feel safe going back to the place where you live?: No   Patient feels she needs some rehab first before going back home.  Need for Family Participation  in Patient Care: Yes (Comment) Care giver support system in place?: No (comment)   Criminal Activity/Legal Involvement Pertinent to Current Situation/Hospitalization: No - Comment as needed  Activities of Daily Living Home Assistive Devices/Equipment: Cane (specify quad or straight),Eyeglasses,Walker (specify type),Grab bars around toilet,Grab bars in shower,Hand-held shower hose (single point cane, front wheeled walker) ADL Screening (condition at time of admission) Patient's cognitive ability adequate to safely complete daily activities?: No Is the patient deaf or have difficulty hearing?: No Does the patient have difficulty seeing, even when wearing glasses/contacts?: Yes Does the patient have difficulty concentrating, remembering, or making decisions?: Yes Patient able to express need for assistance with ADLs?: Yes Does the patient have difficulty dressing or bathing?: Yes Independently performs ADLs?: No Communication: Needs assistance (difficulty speaking secondary to tremors) Is this a change from baseline?: Change from baseline, expected to last >3 days Dressing (OT): Needs assistance Is this a change from baseline?: Change from baseline, expected to last >3 days Grooming: Needs assistance Is this a change from baseline?: Change from baseline, expected to last >3 days Feeding: Needs assistance Is this a change from baseline?: Change from baseline, expected to last >3 days Bathing: Needs assistance Is this a change from baseline?: Change from baseline, expected to last >3 days Toileting: Needs assistance Is this a change from baseline?: Change from baseline, expected to last >3days In/Out Bed: Needs assistance Is this  a change from baseline?: Change from baseline, expected to last >3 days Walks in Home: Needs assistance Is this a change from baseline?: Change from baseline, expected to last >3 days Does the patient have difficulty walking or climbing stairs?: Yes (secondary to  weakness and tremors) Weakness of Legs: Both Weakness of Arms/Hands: Both  Permission Sought/Granted Permission sought to share information with : Facility Contact Representative,Family Supports Permission granted to share information with : Yes, Release of Information Signed  Share Information with NAME: Makaylia, Koone" Pandora Leiter   [phone]  Permission granted to share info w AGENCY: SNF admissions        Emotional Assessment Appearance:: Appears stated age   Affect (typically observed): Accepting,Calm Orientation: : Oriented to Self,Oriented to Place,Oriented to Situation Alcohol / Substance Use: Not Applicable Psych Involvement: Yes (comment)  Admission diagnosis:  Altered mental state [R41.82] Speech abnormality [R47.9] Altered mental status, unspecified altered mental status type [R41.82] Parkinson disease ([place]) [G20] Patient Active Problem List   Diagnosis Date Noted  . Parkinson disease ([place]) 01/14/2020  . Confusion 01/12/2020  . Headache 01/12/2020  . Altered mental state 01/12/2020  . Speech abnormality 01/12/2020  . Edema 07/01/2019  . Right knee pain 06/17/2019  . Hypercalcemia 06/17/2019  . Slow transit constipation 05/31/2019  . Bradycardia 05/31/2019  . History of shingles 04/22/2019  . Upper back pain on right side 04/01/2019  . DOE (dyspnea on exertion) 03/25/2018  . Cough variant asthma 03/24/2018  . Parkinsonism ([place]) 11/15/2015  . Diplopia 10/16/2015  . SBO (small bowel obstruction) ([place]) 09/18/2013  . Glaucoma 09/18/2013  . GERD (gastroesophageal reflux disease) 09/18/2013  . HLD (hyperlipidemia) 02/21/2013  . Essential (primary) hypertension 02/21/2013  . Barrett esophagus 02/19/2013  . Cardiac conduction disorder 03/30/2012  . Difficulty hearing 08/20/2011  . 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 Allergic rhinitis 09/14/2010  . Anxiety state 12/22/2009  . Bipolar I  disorder, single manic episode, in full remission ([place]) 12/01/2009  . 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 History of colon polyps 10/24/2008  . Arthritis, degenerative 10/24/2008   PCP:  [name], MD Pharmacy:   [place], [place] [address] Phone: [phone] Fax: [phone]     Social Determinants of Health (SDOH) Interventions    Readmission Risk Interventions No flowsheet data found.

## 2020-01-14 NOTE — TOC Progression Note (Signed)
Transition of Care [facility]) - Progression Note    Patient Details  Name: Tracey [name] MRN: [id number] Date of Birth: 02/29/1940  [facility]) CM/SW Contact  Geni Bers, RN Phone Number: 01/14/2020, 12:42 PM  Clinical Narrative:    FL2 completed and faxed to [facility].         Expected Discharge Plan and Services                                                 Social Determinants of Health (SDOH) Interventions    Readmission Risk Interventions No flowsheet data found.

## 2020-01-15 DIAGNOSIS — Z85831 Personal history of malignant neoplasm of soft tissue: Secondary | ICD-10-CM | POA: Diagnosis not present

## 2020-01-15 DIAGNOSIS — E785 Hyperlipidemia, unspecified: Secondary | ICD-10-CM | POA: Diagnosis not present

## 2020-01-15 DIAGNOSIS — R4182 Altered mental status, unspecified: Secondary | ICD-10-CM | POA: Diagnosis present

## 2020-01-15 DIAGNOSIS — Z90710 Acquired absence of both cervix and uterus: Secondary | ICD-10-CM | POA: Diagnosis not present

## 2020-01-15 DIAGNOSIS — F304 Manic episode in full remission: Secondary | ICD-10-CM | POA: Diagnosis not present

## 2020-01-15 DIAGNOSIS — Z86010 Personal history of colonic polyps: Secondary | ICD-10-CM | POA: Diagnosis not present

## 2020-01-15 DIAGNOSIS — R519 Headache, unspecified: Secondary | ICD-10-CM | POA: Diagnosis not present

## 2020-01-15 DIAGNOSIS — Z880 Allergy status to penicillin: Secondary | ICD-10-CM | POA: Diagnosis not present

## 2020-01-15 DIAGNOSIS — R001 Bradycardia, unspecified: Secondary | ICD-10-CM | POA: Diagnosis not present

## 2020-01-15 DIAGNOSIS — F419 Anxiety disorder, unspecified: Secondary | ICD-10-CM | POA: Diagnosis not present

## 2020-01-15 DIAGNOSIS — Z90722 Acquired absence of ovaries, bilateral: Secondary | ICD-10-CM | POA: Diagnosis not present

## 2020-01-15 DIAGNOSIS — Z8249 Family history of ischemic heart disease and other diseases of the circulatory system: Secondary | ICD-10-CM | POA: Diagnosis not present

## 2020-01-15 DIAGNOSIS — Z801 Family history of malignant neoplasm of trachea, bronchus and lung: Secondary | ICD-10-CM | POA: Diagnosis not present

## 2020-01-15 DIAGNOSIS — Z888 Allergy status to other drugs, medicaments and biological substances status: Secondary | ICD-10-CM | POA: Diagnosis not present

## 2020-01-15 DIAGNOSIS — L03115 Cellulitis of right lower limb: Secondary | ICD-10-CM | POA: Diagnosis not present

## 2020-01-15 DIAGNOSIS — Z833 Family history of diabetes mellitus: Secondary | ICD-10-CM | POA: Diagnosis not present

## 2020-01-15 DIAGNOSIS — I10 Essential (primary) hypertension: Secondary | ICD-10-CM | POA: Diagnosis not present

## 2020-01-15 DIAGNOSIS — Z96651 Presence of right artificial knee joint: Secondary | ICD-10-CM | POA: Diagnosis not present

## 2020-01-15 DIAGNOSIS — Z20822 Contact with and (suspected) exposure to covid-19: Secondary | ICD-10-CM | POA: Diagnosis not present

## 2020-01-15 DIAGNOSIS — Z66 Do not resuscitate: Secondary | ICD-10-CM | POA: Diagnosis not present

## 2020-01-15 DIAGNOSIS — H532 Diplopia: Secondary | ICD-10-CM | POA: Diagnosis not present

## 2020-01-15 DIAGNOSIS — G20 Parkinson's disease: Secondary | ICD-10-CM | POA: Diagnosis not present

## 2020-01-15 DIAGNOSIS — Z79899 Other long term (current) drug therapy: Secondary | ICD-10-CM | POA: Diagnosis not present

## 2020-01-15 DIAGNOSIS — F3174 Bipolar disorder, in full remission, most recent episode manic: Secondary | ICD-10-CM | POA: Diagnosis not present

## 2020-01-15 DIAGNOSIS — Z885 Allergy status to narcotic agent status: Secondary | ICD-10-CM | POA: Diagnosis not present

## 2020-01-15 MED ORDER — POLYETHYLENE GLYCOL 3350 17 G PO PACK
17.0000 g | PACK | Freq: Every day | ORAL | Status: DC
  Administered 2020-01-15 – 2020-01-18 (×4): 17 g via ORAL
  Filled 2020-01-15 (×4): qty 1

## 2020-01-15 MED ORDER — BISACODYL 10 MG RE SUPP: 10.0000 mg | Freq: Every day | RECTAL | Status: DC | PRN

## 2020-01-15 NOTE — Progress Notes (Signed)
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  Patient has been diagnosed with Parkinson disease and she has been taking Requip.  Dose of Requip was changed a month ago by her PCP.  Patient has noticed difficulty finding words, shuffling gait, and worsening of tremors in upper extremities.  Also there has been increased confusion associations.    Subjective   Patient seen and examined, she is much more alert and coherent today.  Right leg pain and redness is almost gone.   Assessment/Plan:     1. Functional decline/mental status changes-likely progression of Parkinson disease, significant improvement.  Patient was not taking Sinemet.  She was only taking Requip at home.  MRI brain showed no acute stroke.  Neurology was consulted, patient started on Sinemet 10/100 1 tablet p.o. 3 times daily.  Requip is currently on hold.   B12 291, TSH 2.734 2. Right lower extremity cellulitis-resolved, patient presented with erythema with tenderness and warmth of right lower extremity.  Patient was started on ceftriaxone for cellulitis.  Significantly improved.  Right lower extremity Doppler obtained is negative for DVT.  Continue IV ceftriaxone. 3. Hypercalcemia-likely chronic, today calcium is down to 9.8.  PTH is mildly elevated at 79.  She will need endocrinology evaluation as outpatient to rule out [place].  Serum calcium has improved to 9.8. 4. Bipolar disorder-continue lithium, lithium level is 1.0, WNL. 5. Osteoarthritis, s/p total knee arthroplasty on 10/05/2019-stable.  Right lower extremities erythematous  and swollen.  Management as above.     COVID-19 Labs  Recent Labs    01/12/20 2122  CRP 0.6    Lab Results  Component Value Date   SARSCOV2NAA NEGATIVE 01/12/2020   [place] NEGATIVE 10/01/2019     Scheduled medications:   . aspirin EC  81 mg Oral Daily  . carbidopa-levodopa  1 tablet Oral TID  . enoxaparin (LOVENOX) injection  40 mg Subcutaneous Q24H  . lithium carbonate  300 mg Oral BID WC  . polyethylene glycol  17 g Oral Daily         CBG: Recent Labs  Lab 01/12/20 1651  GLUCAP 103*    SpO2: 98 %    CBC: Recent Labs  Lab 01/12/20 1610 01/13/20 0519  WBC 10.2 8.3  HGB 13.1 12.2  HCT 42.2 38.9  MCV 100.7* 100.5*  PLT 326 [id number]    Basic Metabolic Panel: Recent Labs  Lab 01/12/20 1610 01/12/20 2123 01/13/20 0519  NA 141  --  143  K 4.9  --  3.9  CL 112*  --  115*  CO2 22  --  22  GLUCOSE 105*  --  102*  BUN 18  --  20  CREATININE 1.13*  --  0.98  CALCIUM 10.8* 10.5* 9.8     Liver Function Tests: Recent Labs  Lab 01/12/20 1610  AST 43*  ALT 23  ALKPHOS 85  BILITOT 0.8  PROT 7.2  ALBUMIN 4.2     Antibiotics: Anti-infectives (From admission, onward)   Start     Dose/Rate Route Frequency Ordered Stop   01/13/20 1100  cefTRIAXone (ROCEPHIN) 1 g in sodium chloride 0.9 % 100 mL IVPB  1 g 200 mL/hr over 30 Minutes Intravenous Daily 01/13/20 1037         DVT prophylaxis: Lovenox  Code Status: DNR  Family Communication: Discussed with patient's son at bedside   Consultants:  Neurology  Procedures:      Objective   Vitals:   01/14/20 2040 01/14/20 2350 01/15/20 0554 01/15/20 1341  BP: (!) 156/71  (!) 159/83 (!) 144/81  Pulse: (!) 50  (!) 55 (!) 56  Resp: 18  20 18   Temp: 98.2 F (36.8 C)  98 F (36.7 C) 98.1 F (36.7 C)  TempSrc: Oral  Oral Oral  SpO2: 99%  96% 98%  Weight:  68.3 kg    Height:  5\' 2"  (1.575 m)      Intake/Output Summary (Last 24 hours) at 01/15/2020 1450 Last data filed at  01/15/2020 1300 Gross per 24 hour  Intake 900 ml  Output 1300 ml  Net -400 ml    12/30 1901 - 01/01 0700 In: 660 [P.O.:660] Out: 1850 [Urine:1850]  Filed Weights   01/14/20 2350  Weight: 68.3 kg    Physical Examination:   General-appears in no acute distress Heart-S1-S2, regular, no murmur auscultated Lungs-clear to auscultation bilaterally, no wheezing or crackles auscultated Abdomen-soft, nontender, no organomegaly Extremities-no edema in the lower extremities Neuro-alert, oriented x3, no focal deficit noted  Status is: Inpatient  Dispo: The patient is from: Home              Anticipated d/c is to: Home versus skilled nursing facility              Anticipated d/c date is: 01/16/2020              Patient currently not medically stable for discharge  Barrier to discharge-management for Parkinson disease, cellulitis       Data Reviewed:   Recent Results (from the past 240 hour(s))  Resp Panel by RT-PCR (Flu A&B, Covid) Nasopharyngeal Swab     Status: None   Collection Time: 01/12/20  7:26 PM   Specimen: Nasopharyngeal Swab; Nasopharyngeal(NP) swabs in vial transport medium  Result Value Ref Range Status   SARS Coronavirus 2 by RT PCR NEGATIVE NEGATIVE Final    Comment: (NOTE) SARS-CoV-2 target nucleic acids are NOT DETECTED.  The SARS-CoV-2 RNA is generally detectable in upper respiratory specimens during the acute phase of infection. The lowest concentration of SARS-CoV-2 viral copies this assay can detect is 138 copies/mL. A negative result does not preclude SARS-Cov-2 infection and should not be used as the sole basis for treatment or other patient management decisions. A negative result may occur with  improper specimen collection/handling, submission of specimen other than nasopharyngeal swab, presence of viral mutation(s) within the areas targeted by this assay, and inadequate number of viral copies(<138 copies/mL). A negative result must be combined  with clinical observations, patient history, and epidemiological information. The expected result is Negative.  Fact Sheet for Patients:  03/15/2020  Fact Sheet for Healthcare Providers:  01/14/20  This test is no t yet approved or cleared by the BloggerCourse.com FDA and  has been authorized for detection and/or diagnosis of SARS-CoV-2 by FDA under an Emergency Use Authorization (EUA). This EUA will remain  in effect (meaning this test can be used) for the duration of the COVID-19 declaration under Section 564(b)(1) of the Act, 21 U.S.C.section 360bbb-3(b)(1), unless the authorization is terminated  or revoked sooner.       Influenza A by PCR NEGATIVE NEGATIVE Final  Influenza B by PCR NEGATIVE NEGATIVE Final    Comment: (NOTE) The Xpert Xpress SARS-CoV-2/FLU/RSV plus assay is intended as an aid in the diagnosis of influenza from Nasopharyngeal swab specimens and should not be used as a sole basis for treatment. Nasal washings and aspirates are unacceptable for Xpert Xpress SARS-CoV-2/FLU/RSV testing.  Fact Sheet for Patients: EntrepreneurPulse.com.au  Fact Sheet for Healthcare Providers: IncredibleEmployment.be  This test is not yet approved or cleared by the [place] FDA and has been authorized for detection and/or diagnosis of SARS-CoV-2 by FDA under an Emergency Use Authorization (EUA). This EUA will remain in effect (meaning this test can be used) for the duration of the COVID-19 declaration under Section 564(b)(1) of the Act, 21 U.S.C. section 360bbb-3(b)(1), unless the authorization is terminated or revoked.  Performed at [facility], [place] [address] [place], [place] [address]   Culture, blood (routine x 2)     Status: None (Preliminary result)   Collection Time: 01/13/20 12:53 PM   Specimen: BLOOD  Result Value Ref Range Status   Specimen  Description   Final    BLOOD RIGHT ANTECUBITAL Performed at [facility] [address], [place], [place] [address]    Special Requests   Final    BOTTLES DRAWN AEROBIC AND ANAEROBIC Blood Culture adequate volume Performed at [facility] [address] [place], [place] [address]    Culture   Final    NO GROWTH 2 DAYS Performed at [facility] [address], [place], [place] [address]    Report Status PENDING  Incomplete  Culture, blood (routine x 2)     Status: None (Preliminary result)   Collection Time: 01/13/20 12:53 PM   Specimen: Site Not Specified; Blood  Result Value Ref Range Status   Specimen Description   Final    SITE NOT SPECIFIED Performed at [facility] [address] [place], [place] [address]    Special Requests   Final    BOTTLES DRAWN AEROBIC ONLY Blood Culture results may not be optimal due to an inadequate volume of blood received in culture bottles Performed at [facility] [address], [place], [place] [address]    Culture   Final    NO GROWTH 2 DAYS Performed at [facility] [address], [place], [place] [address]    Report Status PENDING  Incomplete    No results for input(s): LIPASE, AMYLASE in the last 168 hours. No results for input(s): AMMONIA in the last 168 hours.  Cardiac Enzymes: No results for input(s): CKTOTAL, CKMB, CKMBINDEX, TROPONINI in the last 168 hours. BNP (last 3 results) No results for input(s): BNP in the last 8760 hours.  ProBNP (last 3 results) No results for input(s): PROBNP in the last 8760 hours.  Studies:  No results found.     Oswald Hillock   [facility] If 7PM-7AM, please contact night-coverage at [email or web address], Office  [phone]   01/15/2020, 2:50 PM  LOS: 1 day

## 2020-01-15 NOTE — Progress Notes (Signed)
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    PT Comments    Progressing with mobility. Continues to require Min assist for mobility.    Follow Up Recommendations  SNF     Equipment Recommendations   (needs a new rollator (4 wheeled walker))    Recommendations for Other Services       Precautions / Restrictions Precautions Precautions: Fall Precaution Comments: incontinence-use pads Restrictions Weight Bearing Restrictions: No    Mobility  Bed Mobility               General bed mobility comments: oob in recliner  Transfers Overall transfer level: Needs assistance Equipment used: Rolling walker (2 wheeled) Transfers: Sit to/from Stand Sit to Stand: Min assist         General transfer comment: Assist to rise, steady, control descent. VCs safety, hand placement.  Ambulation/Gait Ambulation/Gait assistance: Min assist Gait Distance (Feet): 175 Feet Assistive device: Rolling walker (2 wheeled) Gait Pattern/deviations: Decreased stride length;Step-through pattern;Decreased step length - left;Decreased step length - right     General Gait Details: Assist to steady and manage RW intermittently. Pt tolerated distance well.   Stairs             Wheelchair Mobility    Modified Rankin (Stroke Patients Only)       Balance Overall balance assessment: Needs assistance         Standing balance support: Bilateral upper extremity supported Standing balance-Leahy Scale: Poor                              Cognition Arousal/Alertness:  Awake/alert Behavior During Therapy: WFL for tasks assessed/performed Overall Cognitive Status: Impaired/Different from baseline                                 General Comments: requires increased time for processing      Exercises      General Comments        Pertinent Vitals/Pain Pain Assessment: Faces Faces Pain Scale: Hurts little more Pain Location: neck Pain Descriptors / Indicators: Sore;Aching Pain Intervention(s): Monitored during session;Repositioned    Home Living                      Prior Function            PT Goals (current goals can now be found in the care plan section) Progress towards PT goals: Progressing toward goals    Frequency    Min 2X/week      PT Plan      Co-evaluation              AM-PAC PT "6 Clicks" Mobility   Outcome Measure  Help needed turning from your back to your side while in a flat bed without using bedrails?: A Little Help needed moving from lying on your back to sitting on the side of a flat bed without using bedrails?: A Little Help needed moving to and from a bed to a chair (including  a wheelchair)?: A Little Help needed standing up from a chair using your arms (e.g., wheelchair or bedside chair)?: A Little Help needed to walk in hospital room?: A Little Help needed climbing 3-5 steps with a railing? : A Lot 6 Click Score: 17    End of Session Equipment Utilized During Treatment: Gait belt Activity Tolerance: Patient tolerated treatment well Patient left: in chair;with call bell/phone within reach;with chair alarm set   PT Visit Diagnosis: Unsteadiness on feet (R26.81);Difficulty in walking, not elsewhere classified (R26.2)     Time: [id number] PT Time Calculation (min) (ACUTE ONLY): 23 min  Charges:  $Gait Training: 23-37 mins                         Doreatha Massed, PT Acute Rehabilitation  Office: [phone] Pager: [phone]

## 2020-01-16 DIAGNOSIS — F304 Manic episode in full remission: Secondary | ICD-10-CM | POA: Diagnosis not present

## 2020-01-16 DIAGNOSIS — I10 Essential (primary) hypertension: Secondary | ICD-10-CM | POA: Diagnosis not present

## 2020-01-16 DIAGNOSIS — L03115 Cellulitis of right lower limb: Secondary | ICD-10-CM | POA: Diagnosis not present

## 2020-01-16 NOTE — TOC Progression Note (Addendum)
Transition of Care [facility]) - Progression Note    Patient Details  Name: RESHMA HOEY MRN: [id number] Date of Birth: 1940-02-19  [facility] of [facility]) CM/SW Contact  Marina Goodell Phone Number:  [phone] 01/16/2020, 3:14 PM  Clinical Narrative:     Patient has SNF placement offer at [facility], pending insurance authorization.  [facility] stated they would begin insurance auth via their portal on 12/31, [employer] closed on 01/14/20 and 01/15/2019.  Expected Discharge Plan: Skilled Nursing Facility Barriers to Discharge: Insurance Authorization,Continued Medical Work up  Expected Discharge Plan and Services Expected Discharge Plan: Skilled Nursing Facility     Post Acute Care Choice: Skilled Nursing Facility Living arrangements for the past 2 months: Independent Living Facility                                       Social Determinants of Health (SDOH) Interventions    Readmission Risk Interventions No flowsheet data found.

## 2020-01-16 NOTE — Progress Notes (Addendum)
Patient ID: [name], female   DOB: 06-01-1940, 80 y.o.   MRN: [id number]  PROGRESS NOTE    Tracey [name]  [id number] DOB: 1940/10/07 DOA: 01/12/2020 PCP: [name], MD   Brief Narrative:  80 year old female with history of suspected Parkinson disease, hypertension, bradycardia, hypercalcemia, bipolar disorder on lithium/Xanax, GIST s/p resection, osteoarthritis status post total knee replacement in 10/05/2019 presented with change in speech, confusion, functional decline and headache.  Dose of Requip was changed by her PCP a month ago.  Neurology was consulted.  Patient was started on Sinemet.  MRI of the brain was negative for stroke.  She also had right lower extremity cellulitis for which she was started on Rocephin.  PT recommended SNF placement.  Assessment & Plan:   Possible Parkinson's disease worsening with functional decline/mental status changes -Was only on Requip at home.  MRI of the brain was negative for acute stroke.  Neurology evaluation and follow-up appreciated: Currently on Sinemet.  Requip currently on hold. -B12 291, TSH 2.734  Right lower extremity cellulitis -Resolved.  Currently on Rocephin.  Right lower extremity duplex was negative for DVT.  DC Rocephin.  Hypercalcemia -Likely chronic.  No recent labs.  Bipolar disorder -Continue lithium.  Outpatient follow-up with psychiatry.  Osteoarthritis status post total knee arthroplasty on 10/05/2019 -Stable.  Continue PT   DVT prophylaxis: Lovenox Code Status:  DNR Family Communication:  Disposition Plan: Status is: Inpatient  Remains inpatient appropriate because:Inpatient level of care appropriate due to severity of illness   Dispo: The patient is from: Home              Anticipated d/c is to: SNF              Anticipated d/c date is: 1 day              Patient currently is medically stable to d/c.  Consultants: Neurology  Procedures: None  Antimicrobials: Rocephin day #3  today   Subjective: Patient seen and examined at bedside.  Poor historian.  Denies worsening abdominal pain, nausea, vomiting, fever.  Objective: Vitals:   01/15/20 0554 01/15/20 1341 01/15/20 2146 01/16/20 0641  BP: (!) 159/83 (!) 144/81 (!) 147/58 (!) 160/52  Pulse: (!) 55 (!) 56 (!) 53 (!) 51  Resp: 20 18 18 18   Temp: 98 F (36.7 C) 98.1 F (36.7 C) 98.1 F (36.7 C) 98.2 F (36.8 C)  TempSrc: Oral Oral Oral Oral  SpO2: 96% 98% 100% 98%  Weight:      Height:        Intake/Output Summary (Last 24 hours) at 01/16/2020 1049 Last data filed at 01/16/2020 0930 Gross per 24 hour  Intake 440 ml  Output 3050 ml  Net -2610 ml   Filed Weights   01/14/20 2350  Weight: 68.3 kg    Examination:  General exam: Appears calm and comfortable.  Poor historian. Respiratory system: Bilateral decreased breath sounds at bases Cardiovascular system: S1 & S2 heard, bradycardic Gastrointestinal system: Abdomen is nondistended, soft and nontender. Normal bowel sounds heard. Extremities: No cyanosis, clubbing, edema.   Data Reviewed: I have personally reviewed following labs and imaging studies  CBC: Recent Labs  Lab 01/12/20 1610 01/13/20 0519  WBC 10.2 8.3  HGB 13.1 12.2  HCT 42.2 38.9  MCV 100.7* 100.5*  PLT 326 [id number]   Basic Metabolic Panel: Recent Labs  Lab 01/12/20 1610 01/12/20 2123 01/13/20 0519  NA 141  --  143  K 4.9  --  3.9  CL 112*  --  115*  CO2 22  --  22  GLUCOSE 105*  --  102*  BUN 18  --  20  CREATININE 1.13*  --  0.98  CALCIUM 10.8* 10.5* 9.8   GFR: Estimated Creatinine Clearance: 42.2 mL/min (by C-G formula based on SCr of 0.98 mg/dL). Liver Function Tests: Recent Labs  Lab 01/12/20 1610  AST 43*  ALT 23  ALKPHOS 85  BILITOT 0.8  PROT 7.2  ALBUMIN 4.2   No results for input(s): LIPASE, AMYLASE in the last 168 hours. No results for input(s): AMMONIA in the last 168 hours. Coagulation Profile: No results for input(s): INR, PROTIME in the last  168 hours. Cardiac Enzymes: No results for input(s): CKTOTAL, CKMB, CKMBINDEX, TROPONINI in the last 168 hours. BNP (last 3 results) No results for input(s): PROBNP in the last 8760 hours. HbA1C: No results for input(s): HGBA1C in the last 72 hours. CBG: Recent Labs  Lab 01/12/20 1651  GLUCAP 103*   Lipid Profile: No results for input(s): CHOL, HDL, LDLCALC, TRIG, CHOLHDL, LDLDIRECT in the last 72 hours. Thyroid Function Tests: No results for input(s): TSH, T4TOTAL, FREET4, T3FREE, THYROIDAB in the last 72 hours. Anemia Panel: No results for input(s): VITAMINB12, FOLATE, FERRITIN, TIBC, IRON, RETICCTPCT in the last 72 hours. Sepsis Labs: No results for input(s): PROCALCITON, LATICACIDVEN in the last 168 hours.  Recent Results (from the past 240 hour(s))  Resp Panel by RT-PCR (Flu A&B, Covid) Nasopharyngeal Swab     Status: None   Collection Time: 01/12/20  7:26 PM   Specimen: Nasopharyngeal Swab; Nasopharyngeal(NP) swabs in vial transport medium  Result Value Ref Range Status   SARS Coronavirus 2 by RT PCR NEGATIVE NEGATIVE Final    Comment: (NOTE) SARS-CoV-2 target nucleic acids are NOT DETECTED.  The SARS-CoV-2 RNA is generally detectable in upper respiratory specimens during the acute phase of infection. The lowest concentration of SARS-CoV-2 viral copies this assay can detect is 138 copies/mL. A negative result does not preclude SARS-Cov-2 infection and should not be used as the sole basis for treatment or other patient management decisions. A negative result may occur with  improper specimen collection/handling, submission of specimen other than nasopharyngeal swab, presence of viral mutation(s) within the areas targeted by this assay, and inadequate number of viral copies(<138 copies/mL). A negative result must be combined with clinical observations, patient history, and epidemiological information. The expected result is Negative.  Fact Sheet for Patients:   BloggerCourse.com  Fact Sheet for Healthcare Providers:  SeriousBroker.it  This test is no t yet approved or cleared by the [place] FDA and  has been authorized for detection and/or diagnosis of SARS-CoV-2 by FDA under an Emergency Use Authorization (EUA). This EUA will remain  in effect (meaning this test can be used) for the duration of the COVID-19 declaration under Section 564(b)(1) of the Act, 21 U.S.C.section 360bbb-3(b)(1), unless the authorization is terminated  or revoked sooner.       Influenza A by PCR NEGATIVE NEGATIVE Final   Influenza B by PCR NEGATIVE NEGATIVE Final    Comment: (NOTE) The Xpert Xpress SARS-CoV-2/FLU/RSV plus assay is intended as an aid in the diagnosis of influenza from Nasopharyngeal swab specimens and should not be used as a sole basis for treatment. Nasal washings and aspirates are unacceptable for Xpert Xpress SARS-CoV-2/FLU/RSV testing.  Fact Sheet for Patients: BloggerCourse.com  Fact Sheet for Healthcare Providers: SeriousBroker.it  This test is not yet approved or cleared by the  [place] Architectural technologist and has been authorized for detection and/or diagnosis of SARS-CoV-2 by FDA under an Print production planner (EUA). This EUA will remain in effect (meaning this test can be used) for the duration of the COVID-19 declaration under Section 564(b)(1) of the Act, 21 U.S.C. section 360bbb-3(b)(1), unless the authorization is terminated or revoked.  Performed at [facility], [place] [address] [place], [place] [address]   Culture, blood (routine x 2)     Status: None (Preliminary result)   Collection Time: 01/13/20 12:53 PM   Specimen: BLOOD  Result Value Ref Range Status   Specimen Description   Final    BLOOD RIGHT ANTECUBITAL Performed at [facility] [address] [place], [place] [address]     Special Requests   Final    BOTTLES DRAWN AEROBIC AND ANAEROBIC Blood Culture adequate volume Performed at [facility] [address] [place], [place] [address]    Culture   Final    NO GROWTH 3 DAYS Performed at [facility], [place] [address], [place], [place] [address]    Report Status PENDING  Incomplete  Culture, blood (routine x 2)     Status: None (Preliminary result)   Collection Time: 01/13/20 12:53 PM   Specimen: Site Not Specified; Blood  Result Value Ref Range Status   Specimen Description   Final    SITE NOT SPECIFIED Performed at [facility] [address], [place], [place] [address]    Special Requests   Final    BOTTLES DRAWN AEROBIC ONLY Blood Culture results may not be optimal due to an inadequate volume of blood received in culture bottles Performed at [facility] [address], [place],  [address]    Culture   Final    NO GROWTH 3 DAYS Performed at [facility], [place] [address], [place],  [address]    Report Status PENDING  Incomplete         Radiology Studies: No results found.      Scheduled Meds: . aspirin EC  81 mg Oral Daily  . carbidopa-levodopa  1 tablet Oral TID  . enoxaparin (LOVENOX) injection  40 mg Subcutaneous Q24H  . lithium carbonate  300 mg Oral BID WC  . polyethylene glycol  17 g Oral Daily   Continuous Infusions:        Aline August, MD [facility] 01/16/2020, 10:49 AM

## 2020-01-16 NOTE — Progress Notes (Signed)
The patient has improved with Sinemet. She will need close neurology follow up at discharge. Neurohospitalist will sign off. Please call if there are additional questions.   Electronically signed: Dr. Caryl Pina

## 2020-01-17 DIAGNOSIS — G20 Parkinson's disease: Secondary | ICD-10-CM | POA: Diagnosis not present

## 2020-01-17 DIAGNOSIS — F304 Manic episode in full remission: Secondary | ICD-10-CM | POA: Diagnosis not present

## 2020-01-17 DIAGNOSIS — I10 Essential (primary) hypertension: Secondary | ICD-10-CM | POA: Diagnosis not present

## 2020-01-17 DIAGNOSIS — R4182 Altered mental status, unspecified: Secondary | ICD-10-CM | POA: Diagnosis not present

## 2020-01-17 LAB — SARS CORONAVIRUS 2 (TAT 6-24 HRS): SARS Coronavirus 2: NEGATIVE

## 2020-01-17 NOTE — Care Management Important Message (Signed)
Important Message  Patient Details  IM Letter given to the Patient. Name: Tracey [name] MRN: [id number] Date of Birth: 07-16-1940   Medicare Important Message Given:  Yes     Caren Macadam 01/17/2020, 1:50 PM

## 2020-01-17 NOTE — TOC Progression Note (Signed)
Transition of Care [facility]) - Progression Note    Patient Details  Name: Tracey [name] MRN: [id number] Date of Birth: 06/25/1940  Transition of Care [facility]) CM/SW Contact  Geni Bers, RN Phone Number: 01/17/2020, 4:13 PM  Clinical Narrative:     Waiting for insurance authorization. A call was made to pt's son to make him aware of this information. Left VM, waiting for insurance authorization.   Expected Discharge Plan: Skilled Nursing Facility Barriers to Discharge: Insurance Authorization,Continued Medical Work up  Expected Discharge Plan and Services Expected Discharge Plan: Skilled Nursing Facility     Post Acute Care Choice: Skilled Nursing Facility Living arrangements for the past 2 months: Independent Living Facility                                       Social Determinants of Health (SDOH) Interventions    Readmission Risk Interventions No flowsheet data found.

## 2020-01-17 NOTE — Progress Notes (Signed)
Patient ID: Tracey [name], female   DOB: 1940-07-04, 80 y.o.   MRN: [id number]  PROGRESS NOTE    [place] ANNICA SCHWISOW  [id number] DOB: 04/02/1940 DOA: 01/12/2020 PCP: [name], MD   Brief Narrative:  80 year old female with history of suspected Parkinson disease, hypertension, bradycardia, hypercalcemia, bipolar disorder on lithium/Xanax, GIST s/p resection, osteoarthritis status post total knee replacement in 10/05/2019 presented with change in speech, confusion, functional decline and headache.  Dose of Requip was changed by her PCP a month ago.  Neurology was consulted.  Patient was started on Sinemet.  MRI of the brain was negative for stroke.  She also had right lower extremity cellulitis for which she was started on Rocephin.  PT recommended SNF placement.  Assessment & Plan:   Possible Parkinson's disease worsening with functional decline/mental status changes -Was only on Requip at home.  MRI of the brain was negative for acute stroke.  Neurology evaluation and follow-up appreciated: Currently on Sinemet.  Requip currently on hold. -B12 291, TSH 2.734  Right lower extremity cellulitis -Resolved.  Right lower extremity duplex was negative for DVT. Rocephin DC'd on 01/16/20.  Hypercalcemia -Likely chronic.  No recent labs.  Bipolar disorder -Continue lithium.  Outpatient follow-up with psychiatry.  Osteoarthritis status post total knee arthroplasty on 10/05/2019 -Stable.  Continue PT   DVT prophylaxis: Lovenox Code Status:  DNR Family Communication:  Disposition Plan: Status is: Inpatient  Remains inpatient appropriate because:Inpatient level of care appropriate due to severity of illness   Dispo: The patient is from: Home              Anticipated d/c is to: SNF              Anticipated d/c date is: 1 day              Patient currently is medically stable to d/c.  Consultants: Neurology  Procedures: None  Antimicrobials: Rocephin discontinued on  01/16/2020  Subjective: Patient seen and examined at bedside.  She is a poor historian.  No overnight fever, vomiting or worsening shortness of breath reported. Objective: Vitals:   01/16/20 0641 01/16/20 1311 01/16/20 1939 01/17/20 0433  BP: (!) 160/52 (!) 137/54 138/66 (!) 153/93  Pulse: (!) 51 (!) 50 (!) 52 (!) 49  Resp: 18 17 (!) 24 18  Temp: 98.2 F (36.8 C) 97.9 F (36.6 C) 98.1 F (36.7 C) 98.4 F (36.9 C)  TempSrc: Oral Oral Oral Oral  SpO2: 98% 100% 97% 98%  Weight:      Height:        Intake/Output Summary (Last 24 hours) at 01/17/2020 0816 Last data filed at 01/17/2020 0654 Gross per 24 hour  Intake 600 ml  Output 1800 ml  Net -1200 ml   Filed Weights   01/14/20 2350  Weight: 68.3 kg    Examination:  General exam: No acute distress.  Chronically ill looking.  Very slow to respond.  Poor historian. Respiratory system: Decreased breath sounds at bases bilaterally; no wheezing Cardiovascular system: Bradycardic, S1-S2 heard  gastrointestinal system: Abdomen is nondistended, soft and nontender.  Bowel sounds are heard  extremities: No edema or clubbing   Data Reviewed: I have personally reviewed following labs and imaging studies  CBC: Recent Labs  Lab 01/12/20 1610 01/13/20 0519  WBC 10.2 8.3  HGB 13.1 12.2  HCT 42.2 38.9  MCV 100.7* 100.5*  PLT 326 [id number]   Basic Metabolic Panel: Recent Labs  Lab 01/12/20 1610 01/12/20 2123 01/13/20  0519  NA 141  --  143  K 4.9  --  3.9  CL 112*  --  115*  CO2 22  --  22  GLUCOSE 105*  --  102*  BUN 18  --  20  CREATININE 1.13*  --  0.98  CALCIUM 10.8* 10.5* 9.8   GFR: Estimated Creatinine Clearance: 42.2 mL/min (by C-G formula based on SCr of 0.98 mg/dL). Liver Function Tests: Recent Labs  Lab 01/12/20 1610  AST 43*  ALT 23  ALKPHOS 85  BILITOT 0.8  PROT 7.2  ALBUMIN 4.2   No results for input(s): LIPASE, AMYLASE in the last 168 hours. No results for input(s): AMMONIA in the last 168  hours. Coagulation Profile: No results for input(s): INR, PROTIME in the last 168 hours. Cardiac Enzymes: No results for input(s): CKTOTAL, CKMB, CKMBINDEX, TROPONINI in the last 168 hours. BNP (last 3 results) No results for input(s): PROBNP in the last 8760 hours. HbA1C: No results for input(s): HGBA1C in the last 72 hours. CBG: Recent Labs  Lab 01/12/20 1651  GLUCAP 103*   Lipid Profile: No results for input(s): CHOL, HDL, LDLCALC, TRIG, CHOLHDL, LDLDIRECT in the last 72 hours. Thyroid Function Tests: No results for input(s): TSH, T4TOTAL, FREET4, T3FREE, THYROIDAB in the last 72 hours. Anemia Panel: No results for input(s): VITAMINB12, FOLATE, FERRITIN, TIBC, IRON, RETICCTPCT in the last 72 hours. Sepsis Labs: No results for input(s): PROCALCITON, LATICACIDVEN in the last 168 hours.  Recent Results (from the past 240 hour(s))  Resp Panel by RT-PCR (Flu A&B, Covid) Nasopharyngeal Swab     Status: None   Collection Time: 01/12/20  7:26 PM   Specimen: Nasopharyngeal Swab; Nasopharyngeal(NP) swabs in vial transport medium  Result Value Ref Range Status   SARS Coronavirus 2 by RT PCR NEGATIVE NEGATIVE Final    Comment: (NOTE) SARS-CoV-2 target nucleic acids are NOT DETECTED.  The SARS-CoV-2 RNA is generally detectable in upper respiratory specimens during the acute phase of infection. The lowest concentration of SARS-CoV-2 viral copies this assay can detect is 138 copies/mL. A negative result does not preclude SARS-Cov-2 infection and should not be used as the sole basis for treatment or other patient management decisions. A negative result may occur with  improper specimen collection/handling, submission of specimen other than nasopharyngeal swab, presence of viral mutation(s) within the areas targeted by this assay, and inadequate number of viral copies(<138 copies/mL). A negative result must be combined with clinical observations, patient history, and  epidemiological information. The expected result is Negative.  Fact Sheet for Patients:  EntrepreneurPulse.com.au  Fact Sheet for Healthcare Providers:  IncredibleEmployment.be  This test is no t yet approved or cleared by the [place] FDA and  has been authorized for detection and/or diagnosis of SARS-CoV-2 by FDA under an Emergency Use Authorization (EUA). This EUA will remain  in effect (meaning this test can be used) for the duration of the COVID-19 declaration under Section 564(b)(1) of the Act, 21 U.S.C.section 360bbb-3(b)(1), unless the authorization is terminated  or revoked sooner.       Influenza A by PCR NEGATIVE NEGATIVE Final   Influenza B by PCR NEGATIVE NEGATIVE Final    Comment: (NOTE) The Xpert Xpress SARS-CoV-2/FLU/RSV plus assay is intended as an aid in the diagnosis of influenza from Nasopharyngeal swab specimens and should not be used as a sole basis for treatment. Nasal washings and aspirates are unacceptable for Xpert Xpress SARS-CoV-2/FLU/RSV testing.  Fact Sheet for Patients: EntrepreneurPulse.com.au  Fact Sheet for  Healthcare Providers: SeriousBroker.it  This test is not yet approved or cleared by the [place] and has been authorized for detection and/or diagnosis of SARS-CoV-2 by FDA under an Emergency Use Authorization (EUA). This EUA will remain in effect (meaning this test can be used) for the duration of the COVID-19 declaration under Section 564(b)(1) of the Act, 21 U.S.C. section 360bbb-3(b)(1), unless the authorization is terminated or revoked.  Performed at [facility], [address], [place], [place] [address]   Culture, blood (routine x 2)     Status: None (Preliminary result)   Collection Time: 01/13/20 12:53 PM   Specimen: BLOOD  Result Value Ref Range Status   Specimen Description   Final    BLOOD RIGHT  ANTECUBITAL Performed at [facility], [address] [place], [place] [address]    Special Requests   Final    BOTTLES DRAWN AEROBIC AND ANAEROBIC Blood Culture adequate volume Performed at [facility], [address], [place], [place] [address]    Culture   Final    NO GROWTH 3 DAYS Performed at [facility], [address], [place], [place] [address]    Report Status PENDING  Incomplete  Culture, blood (routine x 2)     Status: None (Preliminary result)   Collection Time: 01/13/20 12:53 PM   Specimen: Site Not Specified; Blood  Result Value Ref Range Status   Specimen Description   Final    SITE NOT SPECIFIED Performed at [facility], [address], [place], [place] [address]    Special Requests   Final    BOTTLES DRAWN AEROBIC ONLY Blood Culture results may not be optimal due to an inadequate volume of blood received in culture bottles Performed at [facility], [address], [place], [place] [address]    Culture   Final    NO GROWTH 3 DAYS Performed at [facility], [address] [place], [place] [address]    Report Status PENDING  Incomplete         Radiology Studies: No results found.      Scheduled Meds: . aspirin EC  81 mg Oral Daily  . carbidopa-levodopa  1 tablet Oral TID  . enoxaparin (LOVENOX) injection  40 mg Subcutaneous Q24H  . lithium carbonate  300 mg Oral BID WC  . polyethylene glycol  17 g Oral Daily   Continuous Infusions:        Glade Lloyd, MD [facility] 01/17/2020, 8:16 AM

## 2020-01-17 NOTE — TOC Progression Note (Signed)
Transition of Care [facility]) - Progression Note    Patient Details  Name: Tracey [name] MRN: [id number] Date of Birth: [birth year]  Transition of Care [facility]) CM/SW Contact  Geni Bers, RN Phone Number: 01/17/2020, 10:50 AM  Clinical Narrative:    Insurance authorization was started with [facility] Pt will need 6 hr COVID test if not ordered.    Expected Discharge Plan: Skilled Nursing Facility Barriers to Discharge: Insurance Authorization,Continued Medical Work up  Expected Discharge Plan and Services Expected Discharge Plan: Skilled Nursing Facility     Post Acute Care Choice: Skilled Nursing Facility Living arrangements for the past 2 months: Independent Living Facility                                       Social Determinants of Health (SDOH) Interventions    Readmission Risk Interventions No flowsheet data found.

## 2020-01-17 NOTE — Progress Notes (Signed)
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    PT Comments    Pt assisted with ambulating in hallway and continues to require assist for balance challenging tasks like lower body dressing and turning.  Continue to recommend SNF upon d/c.   Follow Up Recommendations  SNF     Equipment Recommendations  Other (comment) (new rollator or 4 wheeled walker)    Recommendations for Other Services       Precautions / Restrictions Precautions Precautions: Fall Precaution Comments: incontinence-use pads    Mobility  Bed Mobility               General bed mobility comments: oob in recliner  Transfers Overall transfer level: Needs assistance Equipment used: Rolling walker (2 wheeled) Transfers: Sit to/from Stand Sit to Stand: Min assist         General transfer comment: Assist to rise, steady, control descent. verbal cues for safety, hand placement.  Ambulation/Gait Ambulation/Gait assistance: Min assist Gait Distance (Feet): 200 Feet Assistive device: Rolling walker (2 wheeled) Gait Pattern/deviations: Decreased stride length;Step-through pattern     General Gait Details: assist for turning, cues to remain inside RW (pt is used to rollator)   Social research officer, government Rankin (Stroke Patients Only)       Balance Overall balance assessment: Needs assistance         Standing balance support: Bilateral upper extremity supported Standing balance-Leahy Scale: Poor Standing balance comment: reliant on at least 1 UE support, required  assist to don undergarment and pants and pull up in standing                            Cognition Arousal/Alertness: Awake/alert Behavior During Therapy: WFL for tasks assessed/performed Overall Cognitive Status: Impaired/Different from baseline                                 General Comments: requires increased time for processing      Exercises      General Comments        Pertinent Vitals/Pain Pain Assessment: No/denies pain    Home Living                      Prior Function            PT Goals (current goals can now be found in the care plan section) Progress towards PT goals: Progressing toward goals    Frequency    Min 2X/week      PT Plan Current plan remains appropriate    Co-evaluation              AM-PAC PT "6 Clicks" Mobility   Outcome Measure  Help needed turning from your back to your side while in a flat bed without using bedrails?: A Little Help needed moving from lying on your back to sitting on the side of a flat bed without using bedrails?: A Little  Help needed moving to and from a bed to a chair (including a wheelchair)?: A Little Help needed standing up from a chair using your arms (e.g., wheelchair or bedside chair)?: A Little Help needed to walk in hospital room?: A Little Help needed climbing 3-5 steps with a railing? : A Lot 6 Click Score: 17    End of Session Equipment Utilized During Treatment: Gait belt Activity Tolerance: Patient tolerated treatment well Patient left: in chair;with call bell/phone within reach;with chair alarm set Nurse Communication: Mobility status PT Visit Diagnosis: Unsteadiness on feet (R26.81);Difficulty in walking, not elsewhere classified (R26.2)     Time: 1916-6060 PT Time Calculation (min) (ACUTE ONLY): 18 min  Charges:  $Gait Training: 8-22 mins                     Thomasene Mohair PT, DPT Acute Rehabilitation Services Pager: [phone] Office:  [phone]   Maida Sale E 01/17/2020, 1:28 PM

## 2020-01-18 DIAGNOSIS — I10 Essential (primary) hypertension: Secondary | ICD-10-CM | POA: Diagnosis not present

## 2020-01-18 DIAGNOSIS — F304 Manic episode in full remission: Secondary | ICD-10-CM | POA: Diagnosis not present

## 2020-01-18 LAB — CULTURE, BLOOD (ROUTINE X 2)
Culture: NO GROWTH
Culture: NO GROWTH
Special Requests: ADEQUATE

## 2020-01-18 MED ORDER — LOSARTAN POTASSIUM 50 MG PO TABS: 50.0000 mg | ORAL_TABLET | Freq: Every day | ORAL | Status: DC

## 2020-01-18 MED ORDER — ALPRAZOLAM 0.5 MG PO TABS: 0.5000 mg | ORAL_TABLET | Freq: Two times a day (BID) | ORAL | 0 refills | Status: DC | PRN

## 2020-01-18 MED ORDER — POLYETHYLENE GLYCOL 3350 17 G PO PACK: 17.0000 g | PACK | Freq: Every day | ORAL | Status: AC

## 2020-01-18 MED ORDER — CARBIDOPA-LEVODOPA 10-100 MG PO TABS: 1.0000 | ORAL_TABLET | Freq: Three times a day (TID) | ORAL | 0 refills | Status: DC

## 2020-01-18 MED ORDER — LATANOPROST 0.005 % OP SOLN: 1.0000 [drp] | Freq: Every day | OPHTHALMIC | Status: DC

## 2020-01-18 MED ORDER — OMEPRAZOLE 20 MG PO CPDR: 20.0000 mg | DELAYED_RELEASE_CAPSULE | Freq: Two times a day (BID) | ORAL | Status: DC

## 2020-01-18 MED ORDER — DORZOLAMIDE HCL 2 % OP SOLN: 1.0000 [drp] | Freq: Two times a day (BID) | OPHTHALMIC | Status: AC

## 2020-01-18 MED ORDER — ASPIRIN EC 81 MG PO TBEC: 81.0000 mg | DELAYED_RELEASE_TABLET | Freq: Every day | ORAL | 0 refills | Status: AC

## 2020-01-18 NOTE — Plan of Care (Signed)
  Problem: Education: Goal: Knowledge of General Education information will improve Description: Including pain rating scale, medication(s)/side effects and non-pharmacologic comfort measures Outcome: Progressing   Problem: Safety: Goal: Ability to remain free from injury will improve Outcome: Progressing   Problem: Skin Integrity: Goal: Risk for impaired skin integrity will decrease Outcome: Progressing   

## 2020-01-18 NOTE — Discharge Summary (Signed)
Physician Discharge Summary  Tracey [name] [id number] DOB: November 07, 1940 DOA: 01/12/2020  PCP: [name], MD  Admit date: 01/12/2020 Discharge date: 01/18/2020  Admitted From: Home Disposition: SNF  Recommendations for Outpatient Follow-up:  1. Follow up with SNF provider at earliest convenience upon discharge 2. Outpatient follow-up with neurology 3. Follow up in ED if symptoms worsen or new appear   Home Health: No Equipment/Devices: None  Discharge Condition: Stable CODE STATUS: DNR Diet recommendation: Heart healthy  Brief/Interim Summary: 80 year old female with history of suspected Parkinson disease, hypertension, bradycardia, hypercalcemia, bipolar disorder on lithium/Xanax, GIST s/p resection, osteoarthritis status post total knee replacement in 10/05/2019 presented with change in speech, confusion, functional decline and headache.  Dose of Requip was changed by her PCP a month ago.  Neurology was consulted.  Patient was started on Sinemet.  MRI of the brain was negative for stroke.  She also had right lower extremity cellulitis for which she was started on Rocephin.  PT recommended SNF placement.  She has completed antibiotic treatment for cellulitis which has resolved.  She will be discharged to SNF once bed is available with outpatient follow-up with neurology.  Discharge Diagnoses:   Possible Parkinson's disease worsening with functional decline/mental status changes -Was only on Requip at home.  MRI of the brain was negative for acute stroke.  Neurology evaluation and follow-up appreciated: Currently on Sinemet.  Requip currently on hold. -B12 291, TSH 2.734 - She will be discharged to SNF once bed is available with outpatient follow-up with neurology.  Right lower extremity cellulitis -Resolved.  Right lower extremity duplex was negative for DVT. Rocephin DC'd on 01/16/20.  Hypercalcemia -Likely chronic.  No recent labs.  Bipolar disorder -Continue lithium.   Outpatient follow-up with psychiatry.  Osteoarthritis status post total knee arthroplasty on 10/05/2019 -Stable.  Continue PT at [facility]   Discharge Instructions  Discharge Instructions    Ambulatory referral to Neurology   Complete by: As directed    An appointment is requested in approximately: hospital followup in a few weeks   Diet - low sodium heart healthy   Complete by: As directed    Increase activity slowly   Complete by: As directed      Allergies as of 01/18/2020      Reactions   Lisinopril Cough   Penicillins Itching   50 years ago   Adhesive [tape] Itching   Atorvastatin Itching   Dilaudid [hydromorphone Hcl] Itching   Hydromorphone Itching      Medication List    STOP taking these medications   acetaminophen 500 MG tablet Commonly known as: TYLENOL   bismuth subsalicylate [id number] suspension Commonly known as: PEPTO BISMOL   celecoxib 100 MG capsule Commonly known as: CeleBREX   METAMUCIL FIBER PO   rOPINIRole 0.5 MG tablet Commonly known as: REQUIP     TAKE these medications   ALPRAZolam 0.5 MG tablet Commonly known as: XANAX Take 1 tablet (0.5 mg total) by mouth 2 (two) times daily as needed for anxiety. What changed: See the new instructions.   aspirin EC 81 MG tablet Take 1 tablet (81 mg total) by mouth daily.   carbidopa-levodopa 10-100 MG tablet Commonly known as: SINEMET IR Take 1 tablet by mouth 3 (three) times daily.   dorzolamide 2 % ophthalmic solution Commonly known as: TRUSOPT Place 1 drop into both eyes 2 (two) times daily.   latanoprost 0.005 % ophthalmic solution Commonly known as: XALATAN Place 1 drop into both eyes at bedtime.  lithium carbonate 300 MG capsule TAKE 1 CAPSULE TWICE DAILY WITH MEALS. What changed: See the new instructions.   losartan 50 MG tablet Commonly known as: COZAAR Take 1 tablet (50 mg total) by mouth daily.   omeprazole 20 MG capsule Commonly known as: PRILOSEC Take 1 capsule (20 mg  total) by mouth in the morning and at bedtime.   polyethylene glycol 17 g packet Commonly known as: MIRALAX / GLYCOLAX Take 17 g by mouth daily.       Follow-up Information    [name], MD. Schedule an appointment as soon as possible for a visit in 1 week(s).   Specialty: Internal Medicine Contact information: [place] [id number] [phone]              Allergies  Allergen Reactions  . Lisinopril Cough  . Penicillins Itching    50 years ago  . Adhesive [Tape] Itching  . Atorvastatin Itching  . Dilaudid [Hydromorphone Hcl] Itching  . Hydromorphone Itching    Consultations:  Neurology   Procedures/Studies: CT Head Wo Contrast  Result Date: 01/12/2020 CLINICAL DATA:  Altered mental status.  Headache. EXAM: CT HEAD WITHOUT CONTRAST TECHNIQUE: Contiguous axial images were obtained from the base of the skull through the vertex without intravenous contrast. COMPARISON:  None. FINDINGS: Brain: Mild diffuse cortical atrophy is noted. Mild chronic ischemic white matter disease is noted. No mass effect or midline shift is noted. Ventricular size is within normal limits. There is no evidence of mass lesion, hemorrhage or acute infarction. Vascular: No hyperdense vessel or unexpected calcification. Skull: Normal. Negative for fracture or focal lesion. Sinuses/Orbits: No acute finding. Other: None. IMPRESSION: Mild diffuse cortical atrophy. Mild chronic ischemic white matter disease. No acute intracranial abnormality seen. Electronically Signed   By: Marijo Conception M.D.   On: 01/12/2020 19:44   MR BRAIN WO CONTRAST  Result Date: 01/13/2020 CLINICAL DATA:  Mental status change EXAM: MRI HEAD WITHOUT CONTRAST TECHNIQUE: Multiplanar, multiecho pulse sequences of the brain and surrounding structures were obtained without intravenous contrast. COMPARISON:  None. FINDINGS: Brain: There is no acute infarction or intracranial hemorrhage. There is no intracranial  mass, mass effect, or edema. There is no hydrocephalus or extra-axial fluid collection. Prominence of the ventricles and sulci reflects generalized parenchymal volume loss. Patchy foci of T2 hyperintensity in the supratentorial white matter are nonspecific but may reflect minor chronic microvascular ischemic changes. Vascular: Major vessel flow voids at the skull base are preserved. Skull and upper cervical spine: Normal marrow signal is preserved. Sinuses/Orbits: Left maxillary sinus retention cyst. Trace ethmoid mucosal thickening. Bilateral lens replacements. Other: Sella is unremarkable. Patchy right greater than left mastoid fluid opacification. IMPRESSION: No evidence of recent infarction, hemorrhage, or mass. Minor chronic microvascular ischemic changes. Electronically Signed   By: Macy Mis M.D.   On: 01/13/2020 11:49   VAS [place] LOWER EXTREMITY VENOUS (DVT)  Result Date: 01/13/2020  Lower Venous DVT Study Indications: Asymmetrical swelling of RT lower extremity.  Risk Factors: Surgery 10-05-2019 RT total knee arthroplasty. Comparison Study: No prior studies. Performing Technologist: Darlin Coco, RDMS  Examination Guidelines: A complete evaluation includes B-mode imaging, spectral Doppler, color Doppler, and power Doppler as needed of all accessible portions of each vessel. Bilateral testing is considered an integral part of a complete examination. Limited examinations for reoccurring indications may be performed as noted. The reflux portion of the exam is performed with the patient in reverse Trendelenburg.  +---------+---------------+---------+-----------+----------+--------------+ RIGHT  CompressibilityPhasicitySpontaneityPropertiesThrombus Aging +---------+---------------+---------+-----------+----------+--------------+ CFV      Full           Yes      Yes                                 +---------+---------------+---------+-----------+----------+--------------+ SFJ      Full                                                         +---------+---------------+---------+-----------+----------+--------------+ FV Prox  Full                                                        +---------+---------------+---------+-----------+----------+--------------+ FV Mid   Full                                                        +---------+---------------+---------+-----------+----------+--------------+ FV DistalFull                                                        +---------+---------------+---------+-----------+----------+--------------+ PFV      Full                                                        +---------+---------------+---------+-----------+----------+--------------+ POP      Full           Yes      Yes                                 +---------+---------------+---------+-----------+----------+--------------+ PTV      Full                                                        +---------+---------------+---------+-----------+----------+--------------+ PERO     Full                                                        +---------+---------------+---------+-----------+----------+--------------+   +----+---------------+---------+-----------+----------+--------------+ LEFTCompressibilityPhasicitySpontaneityPropertiesThrombus Aging +----+---------------+---------+-----------+----------+--------------+ CFV Full           Yes      Yes                                 +----+---------------+---------+-----------+----------+--------------+  Summary: RIGHT: - There is no evidence of deep vein thrombosis in the lower extremity.  - No cystic structure found in the popliteal fossa.  LEFT: - No evidence of common femoral vein obstruction.  *See table(s) above for measurements and observations. Electronically signed by Servando Snare MD on 01/13/2020 at 8:20:37 PM.    Final        Subjective: Patient seen and examined at  bedside.  Poor historian. No new complains. Denies fever or vomiting.  Discharge Exam: Vitals:   01/17/20 2316 01/18/20 0545  BP: (!) 143/59 (!) 150/66  Pulse: (!) 48 (!) 48  Resp: 17 20  Temp: 98.7 F (37.1 C) 98.2 F (36.8 C)  SpO2: 97% 97%    General exam: Chronically ill looking.  Very slow to respond.  Poor historian. No distress Respiratory system: Decreased breath sounds at bases bilaterally cardiovascular system: S1 S2 heard bradycardic gastrointestinal system: Abdomen is nondistended, soft and nontender.  Normal bowel sounds  extremities: No cyanosis, clubbing or edema    The results of significant diagnostics from this hospitalization (including imaging, microbiology, ancillary and laboratory) are listed below for reference.     Microbiology: Recent Results (from the past 240 hour(s))  Resp Panel by RT-PCR (Flu A&B, Covid) Nasopharyngeal Swab     Status: None   Collection Time: 01/12/20  7:26 PM   Specimen: Nasopharyngeal Swab; Nasopharyngeal(NP) swabs in vial transport medium  Result Value Ref Range Status   SARS Coronavirus 2 by RT PCR NEGATIVE NEGATIVE Final    Comment: (NOTE) SARS-CoV-2 target nucleic acids are NOT DETECTED.  The SARS-CoV-2 RNA is generally detectable in upper respiratory specimens during the acute phase of infection. The lowest concentration of SARS-CoV-2 viral copies this assay can detect is 138 copies/mL. A negative result does not preclude SARS-Cov-2 infection and should not be used as the sole basis for treatment or other patient management decisions. A negative result may occur with  improper specimen collection/handling, submission of specimen other than nasopharyngeal swab, presence of viral mutation(s) within the areas targeted by this assay, and inadequate number of viral copies(<138 copies/mL). A negative result must be combined with clinical observations, patient history, and epidemiological information. The expected result is  Negative.  Fact Sheet for Patients:  EntrepreneurPulse.com.au  Fact Sheet for Healthcare Providers:  IncredibleEmployment.be  This test is no t yet approved or cleared by the [place] FDA and  has been authorized for detection and/or diagnosis of SARS-CoV-2 by FDA under an Emergency Use Authorization (EUA). This EUA will remain  in effect (meaning this test can be used) for the duration of the COVID-19 declaration under Section 564(b)(1) of the Act, 21 U.S.C.section 360bbb-3(b)(1), unless the authorization is terminated  or revoked sooner.       Influenza A by PCR NEGATIVE NEGATIVE Final   Influenza B by PCR NEGATIVE NEGATIVE Final    Comment: (NOTE) The Xpert Xpress SARS-CoV-2/FLU/RSV plus assay is intended as an aid in the diagnosis of influenza from Nasopharyngeal swab specimens and should not be used as a sole basis for treatment. Nasal washings and aspirates are unacceptable for Xpert Xpress SARS-CoV-2/FLU/RSV testing.  Fact Sheet for Patients: EntrepreneurPulse.com.au  Fact Sheet for Healthcare Providers: IncredibleEmployment.be  This test is not yet approved or cleared by the [place] FDA and has been authorized for detection and/or diagnosis of SARS-CoV-2 by FDA under an Emergency Use Authorization (EUA). This EUA will remain in effect (meaning this test can be used) for the duration of the  COVID-19 declaration under Section 564(b)(1) of the Act, 21 U.S.C. section 360bbb-3(b)(1), unless the authorization is terminated or revoked.  Performed at [facility], [address], [place], [place] [address]   Culture, blood (routine x 2)     Status: None   Collection Time: 01/13/20 12:53 PM   Specimen: BLOOD  Result Value Ref Range Status   Specimen Description   Final    BLOOD RIGHT ANTECUBITAL Performed at [facility], [address],  [place], [place] [address]    Special Requests   Final    BOTTLES DRAWN AEROBIC AND ANAEROBIC Blood Culture adequate volume Performed at [facility], [address], [place], [place] [address]    Culture   Final    NO GROWTH 5 DAYS Performed at [facility], [address] [place], [place] [address]    Report Status 01/18/2020 FINAL  Final  Culture, blood (routine x 2)     Status: None   Collection Time: 01/13/20 12:53 PM   Specimen: Site Not Specified; Blood  Result Value Ref Range Status   Specimen Description   Final    SITE NOT SPECIFIED Performed at [facility], [address], [place], [place] [address]    Special Requests   Final    BOTTLES DRAWN AEROBIC ONLY Blood Culture results may not be optimal due to an inadequate volume of blood received in culture bottles Performed at [facility], [address], [place], [place] [address]    Culture   Final    NO GROWTH 5 DAYS Performed at [facility], [address], [place], [place] [address]    Report Status 01/18/2020 FINAL  Final  SARS CORONAVIRUS 2 (TAT 6-24 HRS) Nasopharyngeal Nasopharyngeal Swab     Status: None   Collection Time: 01/17/20 10:54 AM   Specimen: Nasopharyngeal Swab  Result Value Ref Range Status   SARS Coronavirus 2 NEGATIVE NEGATIVE Final    Comment: (NOTE) SARS-CoV-2 target nucleic acids are NOT DETECTED.  The SARS-CoV-2 RNA is generally detectable in upper and lower respiratory specimens during the acute phase of infection. Negative results do not preclude SARS-CoV-2 infection, do not rule out co-infections with other pathogens, and should not be used as the sole basis for treatment or other patient management decisions. Negative results must be combined with clinical observations, patient history, and epidemiological information. The expected result is Negative.  Fact Sheet for  Patients: HairSlick.no  Fact Sheet for Healthcare Providers: quierodirigir.com  This test is not yet approved or cleared by the [place] FDA and  has been authorized for detection and/or diagnosis of SARS-CoV-2 by FDA under an Emergency Use Authorization (EUA). This EUA will remain  in effect (meaning this test can be used) for the duration of the COVID-19 declaration under Se ction 564(b)(1) of the Act, 21 U.S.C. section 360bbb-3(b)(1), unless the authorization is terminated or revoked sooner.  Performed at [facility], [address], [place], [place] [address]      Labs: BNP (last 3 results) No results for input(s): BNP in the last 8760 hours. Basic Metabolic Panel: Recent Labs  Lab 01/12/20 1610 01/12/20 2123 01/13/20 0519  NA 141  --  143  K 4.9  --  3.9  CL 112*  --  115*  CO2 22  --  22  GLUCOSE 105*  --  102*  BUN 18  --  20  CREATININE 1.13*  --  0.98  CALCIUM 10.8* 10.5* 9.8   Liver Function Tests: Recent Labs  Lab 01/12/20 1610  AST 43*  ALT 23  ALKPHOS 85  BILITOT 0.8  PROT 7.2  ALBUMIN 4.2   No results for input(s): LIPASE, AMYLASE in the last 168 hours. No results for input(s): AMMONIA in the last 168 hours. CBC: Recent Labs  Lab 01/12/20 1610 01/13/20 0519  WBC 10.2 8.3  HGB 13.1 12.2  HCT 42.2 38.9  MCV 100.7* 100.5*  PLT 326 272   Cardiac Enzymes: No results for input(s): CKTOTAL, CKMB, CKMBINDEX, TROPONINI in the last 168 hours. BNP: Invalid input(s): POCBNP CBG: Recent Labs  Lab 01/12/20 1651  GLUCAP 103*   D-Dimer No results for input(s): DDIMER in the last 72 hours. Hgb A1c No results for input(s): HGBA1C in the last 72 hours. Lipid Profile No results for input(s): CHOL, HDL, LDLCALC, TRIG, CHOLHDL, LDLDIRECT in the last 72 hours. Thyroid function studies No results for input(s): TSH, T4TOTAL, T3FREE, THYROIDAB in the last 72 hours.  Invalid input(s):  FREET3 Anemia work up No results for input(s): VITAMINB12, FOLATE, FERRITIN, TIBC, IRON, RETICCTPCT in the last 72 hours. Urinalysis    Component Value Date/Time   COLORURINE STRAW (A) 01/12/2020 2103   APPEARANCEUR CLEAR 01/12/2020 2103   LABSPEC 1.012 01/12/2020 2103   PHURINE 6.0 01/12/2020 2103   GLUCOSEU NEGATIVE 01/12/2020 2103   HGBUR NEGATIVE 01/12/2020 2103   BILIRUBINUR NEGATIVE 01/12/2020 2103   KETONESUR NEGATIVE 01/12/2020 2103   PROTEINUR NEGATIVE 01/12/2020 2103   NITRITE NEGATIVE 01/12/2020 2103   LEUKOCYTESUR NEGATIVE 01/12/2020 2103   Sepsis Labs Invalid input(s): PROCALCITONIN,  WBC,  LACTICIDVEN Microbiology Recent Results (from the past 240 hour(s))  Resp Panel by RT-PCR (Flu A&B, Covid) Nasopharyngeal Swab     Status: None   Collection Time: 01/12/20  7:26 PM   Specimen: Nasopharyngeal Swab; Nasopharyngeal(NP) swabs in vial transport medium  Result Value Ref Range Status   SARS Coronavirus 2 by RT PCR NEGATIVE NEGATIVE Final    Comment: (NOTE) SARS-CoV-2 target nucleic acids are NOT DETECTED.  The SARS-CoV-2 RNA is generally detectable in upper respiratory specimens during the acute phase of infection. The lowest concentration of SARS-CoV-2 viral copies this assay can detect is 138 copies/mL. A negative result does not preclude SARS-Cov-2 infection and should not be used as the sole basis for treatment or other patient management decisions. A negative result may occur with  improper specimen collection/handling, submission of specimen other than nasopharyngeal swab, presence of viral mutation(s) within the areas targeted by this assay, and inadequate number of viral copies(<138 copies/mL). A negative result must be combined with clinical observations, patient history, and epidemiological information. The expected result is Negative.  Fact Sheet for Patients:  EntrepreneurPulse.com.au  Fact Sheet for Healthcare Providers:   IncredibleEmployment.be  This test is no t yet approved or cleared by the [place] FDA and  has been authorized for detection and/or diagnosis of SARS-CoV-2 by FDA under an Emergency Use Authorization (EUA). This EUA will remain  in effect (meaning this test can be used) for the duration of the COVID-19 declaration under Section 564(b)(1) of the Act, 21 U.S.C.section 360bbb-3(b)(1), unless the authorization is terminated  or revoked sooner.       Influenza A by PCR NEGATIVE NEGATIVE Final   Influenza B by PCR NEGATIVE NEGATIVE Final    Comment: (NOTE) The Xpert Xpress SARS-CoV-2/FLU/RSV plus assay is intended as an aid in the diagnosis of influenza from Nasopharyngeal swab specimens  and should not be used as a sole basis for treatment. Nasal washings and aspirates are unacceptable for Xpert Xpress SARS-CoV-2/FLU/RSV testing.  Fact Sheet for Patients: BloggerCourse.com  Fact Sheet for Healthcare Providers: SeriousBroker.it  This test is not yet approved or cleared by the [place] FDA and has been authorized for detection and/or diagnosis of SARS-CoV-2 by FDA under an Emergency Use Authorization (EUA). This EUA will remain in effect (meaning this test can be used) for the duration of the COVID-19 declaration under Section 564(b)(1) of the Act, 21 U.S.C. section 360bbb-3(b)(1), unless the authorization is terminated or revoked.  Performed at [facility], [address] [place], [place] [address]   Culture, blood (routine x 2)     Status: None   Collection Time: 01/13/20 12:53 PM   Specimen: BLOOD  Result Value Ref Range Status   Specimen Description   Final    BLOOD RIGHT ANTECUBITAL Performed at [facility], [address], [place], [place] [address]    Special Requests   Final    BOTTLES DRAWN AEROBIC AND ANAEROBIC Blood Culture adequate  volume Performed at [facility], [address] [place], [place] [address]    Culture   Final    NO GROWTH 5 DAYS Performed at [facility], [address] [place], [place] [address]    Report Status 01/18/2020 FINAL  Final  Culture, blood (routine x 2)     Status: None   Collection Time: 01/13/20 12:53 PM   Specimen: Site Not Specified; Blood  Result Value Ref Range Status   Specimen Description   Final    SITE NOT SPECIFIED Performed at [facility], [address] [place], [place] [address]    Special Requests   Final    BOTTLES DRAWN AEROBIC ONLY Blood Culture results may not be optimal due to an inadequate volume of blood received in culture bottles Performed at [facility], [address], [place], [place] [address]    Culture   Final    NO GROWTH 5 DAYS Performed at [facility], [address], [place], [place] [address]    Report Status 01/18/2020 FINAL  Final  SARS CORONAVIRUS 2 (TAT 6-24 HRS) Nasopharyngeal Nasopharyngeal Swab     Status: None   Collection Time: 01/17/20 10:54 AM   Specimen: Nasopharyngeal Swab  Result Value Ref Range Status   SARS Coronavirus 2 NEGATIVE NEGATIVE Final    Comment: (NOTE) SARS-CoV-2 target nucleic acids are NOT DETECTED.  The SARS-CoV-2 RNA is generally detectable in upper and lower respiratory specimens during the acute phase of infection. Negative results do not preclude SARS-CoV-2 infection, do not rule out co-infections with other pathogens, and should not be used as the sole basis for treatment or other patient management decisions. Negative results must be combined with clinical observations, patient history, and epidemiological information. The expected result is Negative.  Fact Sheet for Patients: HairSlick.no  Fact Sheet for Healthcare Providers: quierodirigir.com  This test is not yet  approved or cleared by the [place] FDA and  has been authorized for detection and/or diagnosis of SARS-CoV-2 by FDA under an Emergency Use Authorization (EUA). This EUA will remain  in effect (meaning this test can be used) for the duration of the COVID-19 declaration under Se ction 564(b)(1) of the Act, 21 U.S.C. section 360bbb-3(b)(1), unless the authorization is terminated or revoked sooner.  Performed at [facility], [address], [place], [place] [address]      Time coordinating discharge: 35 minutes  SIGNED:   Aline August, MD  [facility] 01/18/2020, 10:18 AM

## 2020-01-18 NOTE — Plan of Care (Signed)
  Problem: Clinical Measurements: Goal: Respiratory complications will improve Outcome: Adequate for Discharge   Problem: Activity: Goal: Risk for activity intolerance will decrease Outcome: Adequate for Discharge   Problem: Clinical Measurements: Goal: Ability to maintain clinical measurements within normal limits will improve Outcome: Adequate for Discharge

## 2020-01-18 NOTE — TOC Progression Note (Signed)
Transition of Care [facility]) - Progression Note    Patient Details  Name: Tracey [name] MRN: [id number] Date of Birth: 1941/01/10  Transition of Care [facility]) CM/SW Contact  Geni Bers, RN Phone Number: 01/18/2020, 11:15 AM  Clinical Narrative:    Spoke with pt's son who Lyda Perone, he will transport pt to SNF.    Expected Discharge Plan: Skilled Nursing Facility Barriers to Discharge: Insurance Authorization,Continued Medical Work up  Expected Discharge Plan and Services Expected Discharge Plan: Skilled Nursing Facility     Post Acute Care Choice: Skilled Nursing Facility Living arrangements for the past 2 months: Independent Living Facility Expected Discharge Date: 01/18/20                                     Social Determinants of Health (SDOH) Interventions    Readmission Risk Interventions No flowsheet data found.

## 2020-01-18 NOTE — Progress Notes (Signed)
Attempted to call report to [facility] and no answer was tranferred to a VM.

## 2020-01-18 NOTE — TOC Progression Note (Signed)
Transition of Care [facility]) - Progression Note    Patient Details  Name: Tracey [name] MRN: [id number] Date of Birth: 1940/05/08  Transition of Care [facility]) CM/SW Contact  Geni Bers, RN Phone Number: 01/18/2020, 11:24 AM  Clinical Narrative:    Sherron Monday with Ian Malkin with [employer], paperwork will need to be signed by pt and MD.    Expected Discharge Plan: Skilled Nursing Facility Barriers to Discharge: Insurance Authorization,Continued Medical Work up  Expected Discharge Plan and Services Expected Discharge Plan: Skilled Nursing Facility     Post Acute Care Choice: Skilled Nursing Facility Living arrangements for the past 2 months: Independent Living Facility Expected Discharge Date: 01/18/20                                     Social Determinants of Health (SDOH) Interventions    Readmission Risk Interventions No flowsheet data found.

## 2020-01-21 ENCOUNTER — Encounter: Payer: Self-pay | Admitting: Internal Medicine

## 2020-01-21 ENCOUNTER — Non-Acute Institutional Stay (SKILLED_NURSING_FACILITY): Payer: Medicare PPO | Admitting: Internal Medicine

## 2020-01-21 DIAGNOSIS — F304 Manic episode in full remission: Secondary | ICD-10-CM

## 2020-01-21 DIAGNOSIS — Z96651 Presence of right artificial knee joint: Secondary | ICD-10-CM

## 2020-01-21 DIAGNOSIS — I10 Essential (primary) hypertension: Secondary | ICD-10-CM

## 2020-01-21 DIAGNOSIS — F419 Anxiety disorder, unspecified: Secondary | ICD-10-CM

## 2020-01-21 DIAGNOSIS — R600 Localized edema: Secondary | ICD-10-CM

## 2020-01-21 DIAGNOSIS — K219 Gastro-esophageal reflux disease without esophagitis: Secondary | ICD-10-CM

## 2020-01-21 DIAGNOSIS — R001 Bradycardia, unspecified: Secondary | ICD-10-CM

## 2020-01-21 DIAGNOSIS — G20 Parkinson's disease: Secondary | ICD-10-CM

## 2020-01-21 NOTE — Progress Notes (Signed)
Provider:  Veleta Miners MD Location:   [place] Room Number: 29 Place of Service:  SNF ([phone])  PCP: [name], MD Patient Care Team: [name], MD as PCP - General (Internal Medicine) Marti Sleigh, MD as Consulting Physician (Gynecologic Oncology) Ladell Pier, MD as Consulting Physician (Oncology) Richmond Campbell, MD as Consulting Physician (Gastroenterology) Clent Jacks, MD as Consulting Physician (Ophthalmology) Sabra Heck Precious Haws, MD as Referring Physician (Neurology)  Extended Emergency Contact Information Primary Emergency Contact: Serena Colonel" Address: [address]          [place], [place] [address] [name] of [place] Mobile Phone: [phone] Relation: Son  Code Status: DNR Goals of Care: Advanced Directive information Advanced Directives 01/13/2020  Does Patient Have a Medical Advance Directive? Yes  Type of Advance Directive [place]  Does patient want to make changes to medical advance directive? No - Patient declined  Copy of [place] in Chart? Yes - validated most recent copy scanned in chart (See row information)  Would patient like information on creating a medical advance directive? -  Pre-existing out of facility DNR order (yellow form or pink MOST form) -      Chief Complaint  Patient presents with   New Admit To SNF    Admission to SNF    HPI: Patient is a 80 y.o. 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  But she was resisting taking Sinemet due to side effects.  Mansi and may have been managing her symptoms with Requip.  Patient had acute decline few weeks ago as noticed by her family. Brain imaging including MRI showed chronic microvascular ischemic changes with no acute disease with mild cortical atrophy. She was seen by neurology and was started on Sinemet.  Patient made dramatic improvement. On day of discharge patient was at her baseline.  Walking with a walker.  And her confusion was cleared. Patient also was diagnosed with cellulitis of right lower extremity.  Was treated with Rocephin.  Dopplers were done which were negative for any DVT Patient was at her baseline today as seen in SNF. Walking very well with the walker Her only complaint was that she had mild swelling in her legs and she was worried that her infection is back.  No pain fever.      Past Medical History:  Diagnosis Date   Anxiety    Arthritis    Cardiac conduction disorder 03/30/2012   Overview:  STORY: ETT 03/09/2012 Echo 03/07/2012 normal Dr Einar Gip, bradycardia felt due to glaucoma eye drops   Diverticulosis of colon    GIST (gastrointestinal stroma tumor), malignant, colon ([place])    Heart murmur    History of colon polyps 10/24/2008   Hypertension    Major neurocognitive disorder due to Parkinson's disease, possible    Tremors possible parkinsons   Manic disorder, single episode, in full remission ([place]) 12/01/2009   Sixth nerve palsy    Past Surgical History:  Procedure Laterality Date   BILATERAL SALPINGOOPHORECTOMY  09/22/2007   Gastrointestinal Stroma Tumor,  Other  1960   GIST Surgery   ILEOCECETOMY  09/22/2007   OVARIAN CYST REMOVAL Right 1967   SMALL INTESTINE SURGERY  09/22/2007   TOTAL KNEE ARTHROPLASTY Right 10/05/2019   Procedure: RIGHT TOTAL KNEE ARTHROPLASTY;  Surgeon: Meredith Pel, MD;   Location: [facility];  Service: Orthopedics;  Laterality: Right;   TOTAL VAGINAL HYSTERECTOMY  1986   Fibroids    reports that she has never smoked. She has never used smokeless tobacco. She reports that she does not drink alcohol and does not use drugs. Social History   Socioeconomic History   Marital status: Widowed    Spouse name: Not on file   Number of children: 2   Years of education: Bachelors   Highest education level: Not on file  Occupational History   Not on file  Tobacco Use   Smoking status: Never Smoker   Smokeless tobacco: Never Used  Vaping Use   Vaping Use: Never used  Substance and Sexual Activity   Alcohol use: No   Drug use: No   Sexual activity: Not Currently    Birth control/protection: Post-menopausal  Other Topics Concern   Not on file  Social History Narrative   Lives at home alone.   Right-handed.   No caffeine use.      Diet: Eats anything, like fruits, vegetables, regular diet      Do you drink/ eat things with caffeine? No      Marital status:  Widowed                             What year were you married ? 1965      Do you live in a house, apartment,assistred living, condo, trailer, etc.)? Apartment      Is it one or more stories?  One Story      How many persons live in your home ? 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  No                            Type & how often       ADVANCED DIRECTIVES (Please bring copies)      Do you have a living will? Yes      Do you have a DNR form? Yes                      If not, do you want to discuss one?       Do you have signed POA?HPOA forms?  Yes               If so, please bring to your appointment      FUNCTIONAL STATUS- To be completed by Spouse / child / Staff       Do you have difficulty bathing or dressing yourself ?  No   Do you have difficulty preparing food or eating ? No      Do you have difficulty managing your mediation ? No      Do you have difficulty managing your  finances ? No      Do you have difficulty affording your medication ? No      Social Determinants of Radio broadcast assistant Strain: Not on file  Food Insecurity: Not on file  Transportation Needs: Not on file  Physical Activity: Not on file  Stress: Not on file  Social Connections: Not on file  Intimate Partner Violence: Not on file    Functional Status Survey:    Family History  Problem Relation Age of Onset   Congestive Heart Failure Mother    Dementia Mother    Heart disease Father    Diabetes Father    Lung cancer Son     Health Maintenance  Topic Date Due   Hepatitis C Screening  Never done   COVID-19 Vaccine (3 - Moderna risk 4-dose series) 03/15/2019   TETANUS/TDAP  08/19/2021   INFLUENZA VACCINE  Completed   DEXA SCAN  Completed   PNA vac Low Risk Adult  Completed    Allergies  Allergen Reactions   Lisinopril Cough   Penicillins Itching    50 years ago   Adhesive [Tape] Itching   Atorvastatin Itching   Dilaudid [Hydromorphone Hcl] Itching   Hydromorphone Itching    Allergies as of 01/21/2020      Reactions   Lisinopril Cough   Penicillins Itching   50 years ago   Adhesive [tape] Itching   Atorvastatin Itching   Dilaudid [hydromorphone Hcl] Itching   Hydromorphone Itching      Medication List       Accurate as of January 21, 2020  3:36 PM. If you have any questions, ask your nurse or doctor.        acetaminophen 325 MG tablet Commonly known as: TYLENOL Take 650 mg by mouth every 4 (four) hours as needed.   ALPRAZolam 0.5 MG tablet Commonly known as: XANAX Take 1 tablet (0.5 mg total) by mouth 2 (two) times daily as needed for anxiety.   aspirin EC 81 MG tablet Take 1 tablet (81 mg total) by mouth daily.   carbidopa-levodopa 10-100 MG tablet Commonly  known as: SINEMET IR Take 1 tablet by mouth 3 (three) times daily.   dorzolamide 2 % ophthalmic solution Commonly known as: TRUSOPT Place 1 drop into both eyes 2 (two) times daily.   latanoprost 0.005 % ophthalmic solution Commonly known as: XALATAN Place 1 drop into both eyes at bedtime.   lithium carbonate 300 MG capsule TAKE 1 CAPSULE TWICE DAILY WITH MEALS. What changed: See the new instructions.   losartan 50 MG tablet Commonly known as: COZAAR Take 1 tablet (50 mg total) by mouth daily.   omeprazole 20 MG capsule Commonly known as: PRILOSEC Take 1 capsule (20 mg total) by mouth in the morning and at bedtime.   polyethylene glycol 17 g packet Commonly known as: MIRALAX / GLYCOLAX Take 17 g by mouth daily.       Review of Systems Review of Systems  Constitutional: Negative for activity change, appetite change, chills, diaphoresis, fatigue and fever.  HENT: Negative for mouth sores, postnasal drip, rhinorrhea, sinus pain and sore throat.   Respiratory: Negative for apnea, cough, chest tightness, shortness of breath and wheezing.   Cardiovascular: Negative for chest pain, palpitations   Gastrointestinal: Negative for abdominal distention, abdominal pain, constipation, diarrhea, nausea and vomiting.  Genitourinary: Negative for dysuria and frequency.  Musculoskeletal: Negative for arthralgias, joint swelling and myalgias.  Skin: Negative for rash.  Neurological: Negative for dizziness, syncope,  weakness, light-headedness and numbness.  Psychiatric/Behavioral: Negative for behavioral problems, confusion and sleep disturbance.     Vitals:   01/21/20 1531  BP: 118/62  Pulse: 97  Resp: 20  Temp: 98.7 F (37.1 C)  SpO2: 99%  Weight: 145 lb 6.4 oz (66 kg)  Height: 5\' 2"  (1.575 m)   Body mass index is 26.59 kg/m. Physical Exam  Constitutional: Oriented to person, place, and time. Well-developed and well-nourished.  HENT:  Head: Normocephalic.  Mouth/Throat:  Oropharynx is clear and moist.  Eyes: Pupils are equal, round, and reactive to light.  Neck: Neck supple.  Cardiovascular: Normal rate and normal heart sounds.  No murmur heard. Pulmonary/Chest: Effort normal and breath sounds normal. No respiratory distress. No wheezes. She has no rales.  Abdominal: Soft. Bowel sounds are normal. No distension. There is no tenderness. There is no rebound.  Musculoskeletal: Mild edema Bilateral No Calf tenderness No Signs of Cellulitis Lymphadenopathy: none Neurological: Alert and oriented to person, place, and time.  Skin: Skin is warm and dry.  Psychiatric: Normal mood and affect. Behavior is normal. Thought content normal.    Labs reviewed: Basic Metabolic Panel: Recent Labs    10/01/19 1139 01/12/20 1610 01/12/20 2123 01/13/20 0519  NA 140 141  --  143  K 4.4 4.9  --  3.9  CL 109 112*  --  115*  CO2 24 22  --  22  GLUCOSE 91 105*  --  102*  BUN 20 18  --  20  CREATININE 0.84 1.13*  --  0.98  CALCIUM 10.9* 10.8* 10.5* 9.8   Liver Function Tests: Recent Labs    04/05/19 0905 04/06/19 1245 05/28/19 0000 01/12/20 1610  AST 19 23 22  43*  ALT 13 18 13 23   ALKPHOS  --  75 101 85  BILITOT 0.4 0.5  --  0.8  PROT 6.9 6.7  --  7.2  ALBUMIN  --  4.0 4.6 4.2   No results for input(s): LIPASE, AMYLASE in the last 8760 hours. No results for input(s): AMMONIA in the last 8760 hours. CBC: Recent Labs    04/05/19 0905 04/06/19 1245 05/28/19 0000 10/01/19 1139 01/12/20 1610 01/13/20 0519  WBC 8.2 8.1 8.7 8.7 10.2 8.3  NEUTROABS 5,666 6.0 7,439  --   --   --   HGB 12.9 12.4 14.1 12.5 13.1 12.2  HCT 37.4 39.0 41 41.1 42.2 38.9  MCV 97.9 105.4*  --  102.0* 100.7* 100.5*  PLT 297 268 336 324 326 272   Cardiac Enzymes: No results for input(s): CKTOTAL, CKMB, CKMBINDEX, TROPONINI in the last 8760 hours. BNP: Invalid input(s): POCBNP No results found for: HGBA1C Lab Results  Component Value Date   TSH 2.734 01/12/2020   Lab Results   Component Value Date   WCBJSEGB15 176 01/12/2020   No results found for: FOLATE No results found for: IRON, TIBC, FERRITIN  Imaging and Procedures obtained prior to SNF admission: CT Head Wo Contrast  Result Date: 01/12/2020 CLINICAL DATA:  Altered mental status.  Headache. EXAM: CT HEAD WITHOUT CONTRAST TECHNIQUE: Contiguous axial images were obtained from the base of the skull through the vertex without intravenous contrast. COMPARISON:  None. FINDINGS: Brain: Mild diffuse cortical atrophy is noted. Mild chronic ischemic white matter disease is noted. No mass effect or midline shift is noted. Ventricular size is within normal limits. There is no evidence of mass lesion, hemorrhage or acute infarction. Vascular: No hyperdense vessel or unexpected calcification. Skull: Normal. Negative for  fracture or focal lesion. Sinuses/Orbits: No acute finding. Other: None. IMPRESSION: Mild diffuse cortical atrophy. Mild chronic ischemic white matter disease. No acute intracranial abnormality seen. Electronically Signed   By: Marijo Conception M.D.   On: 01/12/2020 19:44   MR BRAIN WO CONTRAST  Result Date: 01/13/2020 CLINICAL DATA:  Mental status change EXAM: MRI HEAD WITHOUT CONTRAST TECHNIQUE: Multiplanar, multiecho pulse sequences of the brain and surrounding structures were obtained without intravenous contrast. COMPARISON:  None. FINDINGS: Brain: There is no acute infarction or intracranial hemorrhage. There is no intracranial mass, mass effect, or edema. There is no hydrocephalus or extra-axial fluid collection. Prominence of the ventricles and sulci reflects generalized parenchymal volume loss. Patchy foci of T2 hyperintensity in the supratentorial white matter are nonspecific but may reflect minor chronic microvascular ischemic changes. Vascular: Major vessel flow voids at the skull base are preserved. Skull and upper cervical spine: Normal marrow signal is preserved. Sinuses/Orbits: Left maxillary sinus  retention cyst. Trace ethmoid mucosal thickening. Bilateral lens replacements. Other: Sella is unremarkable. Patchy right greater than left mastoid fluid opacification. IMPRESSION: No evidence of recent infarction, hemorrhage, or mass. Minor chronic microvascular ischemic changes. Electronically Signed   By: Macy Mis M.D.   On: 01/13/2020 11:49   VAS [place] LOWER EXTREMITY VENOUS (DVT)  Result Date: 01/13/2020  Lower Venous DVT Study Indications: Asymmetrical swelling of RT lower extremity.  Risk Factors: Surgery 10-05-2019 RT total knee arthroplasty. Comparison Study: No prior studies. Performing Technologist: Darlin Coco, RDMS  Examination Guidelines: A complete evaluation includes B-mode imaging, spectral Doppler, color Doppler, and power Doppler as needed of all accessible portions of each vessel. Bilateral testing is considered an integral part of a complete examination. Limited examinations for reoccurring indications may be performed as noted. The reflux portion of the exam is performed with the patient in reverse Trendelenburg.  +---------+---------------+---------+-----------+----------+--------------+  RIGHT     Compressibility Phasicity Spontaneity Properties Thrombus Aging  +---------+---------------+---------+-----------+----------+--------------+  CFV       Full            Yes       Yes                                    +---------+---------------+---------+-----------+----------+--------------+  SFJ       Full                                                             +---------+---------------+---------+-----------+----------+--------------+  FV Prox   Full                                                             +---------+---------------+---------+-----------+----------+--------------+  FV Mid    Full                                                             +---------+---------------+---------+-----------+----------+--------------+  FV Distal Full                                                              +---------+---------------+---------+-----------+----------+--------------+  PFV       Full                                                             +---------+---------------+---------+-----------+----------+--------------+  POP       Full            Yes       Yes                                    +---------+---------------+---------+-----------+----------+--------------+  PTV       Full                                                             +---------+---------------+---------+-----------+----------+--------------+  PERO      Full                                                             +---------+---------------+---------+-----------+----------+--------------+   +----+---------------+---------+-----------+----------+--------------+  LEFT Compressibility Phasicity Spontaneity Properties Thrombus Aging  +----+---------------+---------+-----------+----------+--------------+  CFV  Full            Yes       Yes                                    +----+---------------+---------+-----------+----------+--------------+     Summary: RIGHT: - There is no evidence of deep vein thrombosis in the lower extremity.  - No cystic structure found in the popliteal fossa.  LEFT: - No evidence of common femoral vein obstruction.  *See table(s) above for measurements and observations. Electronically signed by Servando Snare MD on 01/13/2020 at 8:20:37 PM.    Final     Assessment/Plan Parkinsonism, unspecified Parkinsonism type ([place]) On Sinemet now Off Requip Needs to follow with Neurology  Bipolar I disorder, single manic episode, in full remission ([place]) On High Dose of Lithium for past 30 years Have discussed with her option of reducing the dose Levels have been in good Range  Hypercalcemia Calcium level varies  ? Due to Lithium Will refer to endocrinology as outpatient  Essential (primary) hypertension  Doing well on Cozaar Bradycardia Stable Bilateral edema of lower  extremity No Need for diuretics Start Ted hoses History of total right knee replacement Pain seems to be controlled Walking very well with the walker Gastroesophageal reflux disease, unspecified whether esophagitis present On Prilpsec Anxiety Xanax PRn  H/o HLD  Will Be discharge to [facility] soon  Family/ staff Communication:   Labs/tests ordered: CMP and CBC  Total time spent in this patient care encounter was  45_  minutes; greater than 50% of the visit spent counseling patient and staff, reviewing records , Labs and coordinating care for problems addressed at this encounter.

## 2020-01-26 ENCOUNTER — Non-Acute Institutional Stay (SKILLED_NURSING_FACILITY): Payer: Medicare PPO | Admitting: Nurse Practitioner

## 2020-01-26 ENCOUNTER — Encounter: Payer: Self-pay | Admitting: Nurse Practitioner

## 2020-01-26 DIAGNOSIS — F304 Manic episode in full remission: Secondary | ICD-10-CM

## 2020-01-26 DIAGNOSIS — N183 Chronic kidney disease, stage 3 unspecified: Secondary | ICD-10-CM | POA: Insufficient documentation

## 2020-01-26 DIAGNOSIS — K219 Gastro-esophageal reflux disease without esophagitis: Secondary | ICD-10-CM

## 2020-01-26 DIAGNOSIS — M5481 Occipital neuralgia: Secondary | ICD-10-CM | POA: Insufficient documentation

## 2020-01-26 DIAGNOSIS — M8949 Other hypertrophic osteoarthropathy, multiple sites: Secondary | ICD-10-CM

## 2020-01-26 DIAGNOSIS — G20 Parkinson's disease: Secondary | ICD-10-CM | POA: Diagnosis not present

## 2020-01-26 DIAGNOSIS — N1831 Chronic kidney disease, stage 3a: Secondary | ICD-10-CM

## 2020-01-26 DIAGNOSIS — M159 Polyosteoarthritis, unspecified: Secondary | ICD-10-CM

## 2020-01-26 DIAGNOSIS — C49A3 Gastrointestinal stromal tumor of small intestine: Secondary | ICD-10-CM

## 2020-01-26 DIAGNOSIS — K5901 Slow transit constipation: Secondary | ICD-10-CM | POA: Diagnosis not present

## 2020-01-26 DIAGNOSIS — R001 Bradycardia, unspecified: Secondary | ICD-10-CM

## 2020-01-26 DIAGNOSIS — R609 Edema, unspecified: Secondary | ICD-10-CM

## 2020-01-26 DIAGNOSIS — I10 Essential (primary) hypertension: Secondary | ICD-10-CM

## 2020-01-26 DIAGNOSIS — G243 Spasmodic torticollis: Secondary | ICD-10-CM | POA: Insufficient documentation

## 2020-01-26 LAB — BASIC METABOLIC PANEL
BUN: 19 (ref 4–21)
CO2: 25 — AB (ref 13–22)
Chloride: 109 — AB (ref 99–108)
Creatinine: 0.9 (ref 0.5–1.1)
Glucose: 82
Potassium: 5.1 (ref 3.4–5.3)
Sodium: 139 (ref 137–147)

## 2020-01-26 LAB — CBC AND DIFFERENTIAL
HCT: 35 — AB (ref 36–46)
Hemoglobin: 11.7 — AB (ref 12.0–16.0)
Neutrophils Absolute: 4828
Platelets: 294 (ref 150–399)
WBC: 7.1

## 2020-01-26 LAB — HEPATIC FUNCTION PANEL
ALT: 12 (ref 7–35)
AST: 11 — AB (ref 13–35)
Alkaline Phosphatase: 81 (ref 25–125)
Bilirubin, Total: 0.3

## 2020-01-26 LAB — COMPREHENSIVE METABOLIC PANEL
Albumin: 3.5 (ref 3.5–5.0)
Calcium: 10 (ref 8.7–10.7)
Globulin: 1.9

## 2020-01-26 LAB — CBC: RBC: 3.77 — AB (ref 3.87–5.11)

## 2020-01-26 NOTE — Progress Notes (Signed)
Location:   SNF [facility]   Place of Service:  SNF (31)  Provider: Marlana Latus NP  PCP: [name], MD Patient Care Team: [name], MD as PCP - General (Internal Medicine) Marti Sleigh, MD as Consulting Physician (Gynecologic Oncology) Ladell Pier, MD as Consulting Physician (Oncology) Richmond Campbell, MD as Consulting Physician (Gastroenterology) Clent Jacks, MD as Consulting Physician (Ophthalmology) Jacques Navy, MD as Referring Physician (Neurology)  Extended Emergency Contact Information Primary Emergency Contact: Serena Colonel" Address: [address]          [place], [place] [address] [name] of [place] Mobile Phone: [phone] Relation: Son  Code Status: DNR Goals of care:  Advanced Directive information Advanced Directives 01/26/2020  Does Patient Have a Medical Advance Directive? Yes  Type of Paramedic of [place];Out of facility DNR (pink MOST or yellow form)  Does patient want to make changes to medical advance directive? No - Patient declined  Copy of [place] in Chart? Yes - validated most recent copy scanned in chart (See row information)  Would patient like information on creating a medical advance directive? -  Pre-existing out of facility DNR order (yellow form or pink MOST form) -     Allergies  Allergen Reactions  . Lisinopril Cough  . Penicillins Itching    50 years ago  . Adhesive [Tape] Itching  . Atorvastatin Itching  . Dilaudid [Hydromorphone Hcl] Itching  . Hydromorphone Itching    Chief Complaint  Patient presents with  . Discharge Note    Discharge Visit     HPI:  80 y.o. 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  The patient was improved on Sinemet per neurology, MRI brain showed chronic microvascular ischemic changes. RLE cellulitis was fully treated with Rocephin.     Hx of GIST of small intestine s/p resection.prn MiraLax. Daily Psyllium . Constipation. Prn Metamucil, MiraLaxqdare effective. Hx of Parkinson's, stable(diplopiaR+L lateral,tremor in fingers), on Sinemet f/u Neurology.  GERD, stable, on Omeprazole $RemoveBefor'20mg'AtQOsWKlWnez$  bid HTN, blood pressure is controlled on Losartan, Bun/creat 19/0.93 01/25/20 Bipolar disorder, stable, on Lithium $RemoveBe'300mg'SMhUtXKBL$  bid, Alprazolam 0.$RemoveBeforeDEI'5MG'aBdOSOWlQBjPpPBe$  BID prn. TSH 2.734 01/12/20             Bradycardia, improved from 50s from 40s,normalized after Tylenol PM dc'd.  Hypercalcemia, pending endocrinology, Ca 9.8 01/13/20, PTH 79 01/12/20.             Hypercalcemia, pending endocrinology, Ca 9.8 01/13/20, PTH 79 01/12/20.   BLE trace edema    OA takes  Tylenol, s/p R knee arthroplasty.   Past Medical History:  Diagnosis Date  . Anxiety   . Arthritis   . Cardiac conduction disorder 03/30/2012   Overview:  STORY: ETT 03/09/2012 Echo 03/07/2012 normal Dr Einar Gip, bradycardia felt due to glaucoma eye drops  . Diverticulosis of colon   . GIST (gastrointestinal stroma tumor), malignant, colon ([place])   . Heart murmur   . History of colon polyps 10/24/2008  . Hypertension   . Major neurocognitive disorder due to Parkinson's disease, possible    Tremors possible parkinsons  . Manic disorder, single episode, in full remission ([place]) 12/01/2009  . Sixth nerve palsy     Past Surgical History:  Procedure Laterality Date  . BILATERAL SALPINGOOPHORECTOMY  09/22/2007  . Gastrointestinal Stroma Tumor,  Other  1960   GIST Surgery  . ILEOCECETOMY  09/22/2007  . OVARIAN CYST REMOVAL Right 1967  . SMALL INTESTINE SURGERY  09/22/2007  . TOTAL KNEE ARTHROPLASTY Right 10/05/2019   Procedure: RIGHT  TOTAL KNEE ARTHROPLASTY;  Surgeon: Meredith Pel, MD;  Location: [facility];  Service: Orthopedics;  Laterality: Right;  . TOTAL VAGINAL HYSTERECTOMY  1986   Fibroids      reports that she has never smoked. She has never used smokeless tobacco. She reports that she does not drink alcohol and does not use drugs. Social History   Socioeconomic History  . Marital status: Widowed    Spouse name: Not on file  . Number of children: 2  . Years of education: Bachelors  . Highest education level: Not on file  Occupational History  . Not on file  Tobacco Use  . Smoking status: Never Smoker  . Smokeless tobacco: Never Used  Vaping Use  . Vaping Use: Never used  Substance and Sexual Activity  . Alcohol use: No  . Drug use: No  . Sexual activity: Not Currently    Birth control/protection: Post-menopausal  Other Topics Concern  . Not on file  Social History Narrative   Lives at home alone.   Right-handed.   No caffeine use.      Diet: Eats anything, like fruits, vegetables, regular diet      Do you drink/ eat things with caffeine? No      Marital status:  Widowed                             What year were you married ? 1965      Do you live in a house, apartment,assistred living, condo, trailer, etc.)? Apartment      Is it one or more stories?  One Story      How many persons live in your home ? 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  No                            Type & how often       ADVANCED DIRECTIVES (Please bring copies)      Do you have a living will? Yes      Do you have a DNR form? Yes                      If not, do you want to discuss one?       Do you have signed POA?HPOA forms?  Yes               If so, please bring to your appointment      FUNCTIONAL STATUS- To be completed by Spouse / child /  Staff       Do you have difficulty bathing or dressing yourself ? No   Do you have difficulty preparing food or eating ? No      Do you have difficulty managing your mediation ? No      Do you have difficulty  managing your finances ? No      Do you have difficulty affording your medication ? No      Social Determinants of Radio broadcast assistant Strain: Not on file  Food Insecurity: Not on file  Transportation Needs: Not on file  Physical Activity: Not on file  Stress: Not on file  Social Connections: Not on file  Intimate Partner Violence: Not on file   Functional Status Survey:    Allergies  Allergen Reactions  . Lisinopril Cough  . Penicillins Itching    50 years ago  . Adhesive [Tape] Itching  . Atorvastatin Itching  . Dilaudid [Hydromorphone Hcl] Itching  . Hydromorphone Itching    Pertinent  Health Maintenance Due  Topic Date Due  . INFLUENZA VACCINE  Completed  . DEXA SCAN  Completed  . PNA vac Low Risk Adult  Completed    Medications: Allergies as of 01/26/2020      Reactions   Lisinopril Cough   Penicillins Itching   50 years ago   Adhesive [tape] Itching   Atorvastatin Itching   Dilaudid [hydromorphone Hcl] Itching   Hydromorphone Itching      Medication List       Accurate as of January 26, 2020 11:59 PM. If you have any questions, ask your nurse or doctor.        acetaminophen 325 MG tablet Commonly known as: TYLENOL Take 650 mg by mouth every 4 (four) hours as needed.   ALPRAZolam 0.5 MG tablet Commonly known as: XANAX Take 1 tablet (0.5 mg total) by mouth 2 (two) times daily as needed for anxiety.   aspirin EC 81 MG tablet Take 1 tablet (81 mg total) by mouth daily.   carbidopa-levodopa 10-100 MG tablet Commonly known as: SINEMET IR Take 1 tablet by mouth 3 (three) times daily.   dorzolamide 2 % ophthalmic solution Commonly known as: TRUSOPT Place 1 drop into both eyes 2 (two) times daily.   latanoprost 0.005 %  ophthalmic solution Commonly known as: XALATAN Place 1 drop into both eyes at bedtime.   lithium carbonate 300 MG capsule TAKE 1 CAPSULE TWICE DAILY WITH MEALS.   losartan 50 MG tablet Commonly known as: COZAAR Take 1 tablet (50 mg total) by mouth daily.   omeprazole 20 MG capsule Commonly known as: PRILOSEC Take 1 capsule (20 mg total) by mouth in the morning and at bedtime.   polyethylene glycol 17 g packet Commonly known as: MIRALAX / GLYCOLAX Take 17 g by mouth daily.   VITAMIN B12-FOLIC ACID PO Take 0,630 mcg by mouth daily in the afternoon.       Review of Systems  Constitutional: Negative for appetite change, fatigue and fever.  HENT: Positive for hearing loss. Negative for congestion and trouble swallowing.   Eyes: Negative for visual disturbance.       Glaucoma, diplopia R+L lateral peripheral visual fields only.   Respiratory: Positive for cough. Negative for shortness of breath.        Occasionally DOE, hacking cough  Cardiovascular: Positive for leg swelling.  Gastrointestinal: Negative for abdominal pain and constipation.  Genitourinary: Negative for dysuria, frequency and urgency.  Musculoskeletal: Positive for arthralgias and gait problem.       Walker.   Skin: Negative for color change.  Neurological: Positive for tremors, speech difficulty and headaches. Negative for dizziness, facial asymmetry and light-headedness.       Tremors in hands/fingers at rest. L>R, progressing, comes and goes nature of the neck/occipital  pain. Stiff neck. Pain the patent right knee, s/p TKR, slightly warm/swelling.   Psychiatric/Behavioral: Positive for confusion. Negative for behavioral problems and sleep disturbance. The patient is not nervous/anxious.     Vitals:   01/26/20 1609  BP: 120/60  Pulse: (!) 46  Resp: 19  Temp: 98.9 F (37.2 C)  SpO2: 96%  Weight: 146 lb 12.8 oz (66.6 kg)  Height: $Remove'5\' 2"'oJxbUsM$  (1.575 m)   Body mass index is 26.85 kg/m. Physical Exam Vitals  and nursing note reviewed.  Constitutional:      Appearance: Normal appearance.  HENT:     Head: Normocephalic and atraumatic.     Mouth/Throat:     Mouth: Mucous membranes are moist.  Eyes:     Extraocular Movements: Extraocular movements intact.     Conjunctiva/sclera: Conjunctivae normal.     Pupils: Pupils are equal, round, and reactive to light.  Cardiovascular:     Rate and Rhythm: Regular rhythm. Bradycardia present.     Heart sounds: No murmur heard.   Pulmonary:     Breath sounds: No rales.  Abdominal:     General: Bowel sounds are normal.     Palpations: Abdomen is soft.     Tenderness: There is no abdominal tenderness.  Musculoskeletal:        General: No tenderness.     Cervical back: Normal range of motion and neck supple.     Right lower leg: Edema present.     Left lower leg: Edema present.     Comments: Chronic R knee pain is improved. S/p TKR right, slightly warmth and swelling of the right knee. Stiff neck, comes and goes neck/occipital pain.   Skin:    General: Skin is warm and dry.  Neurological:     General: No focal deficit present.     Mental Status: She is alert and oriented to person, place, and time. Mental status is at baseline.     Motor: No weakness.     Coordination: Coordination abnormal.     Gait: Gait abnormal.     Comments: Resting tremor in fingers, improved.   Psychiatric:        Mood and Affect: Mood normal.        Behavior: Behavior normal.        Thought Content: Thought content normal.        Judgment: Judgment normal.     Labs reviewed: Basic Metabolic Panel: Recent Labs    10/01/19 1139 01/12/20 1610 01/12/20 2123 01/13/20 0519  NA 140 141  --  143  K 4.4 4.9  --  3.9  CL 109 112*  --  115*  CO2 24 22  --  22  GLUCOSE 91 105*  --  102*  BUN 20 18  --  20  CREATININE 0.84 1.13*  --  0.98  CALCIUM 10.9* 10.8* 10.5* 9.8   Liver Function Tests: Recent Labs    04/05/19 0905 04/06/19 1245 05/28/19 0000  01/12/20 1610  AST $Re'19 23 22 'Wkf$ 43*  ALT $Re'13 18 13 23  'OPE$ ALKPHOS  --  75 101 85  BILITOT 0.4 0.5  --  0.8  PROT 6.9 6.7  --  7.2  ALBUMIN  --  4.0 4.6 4.2   No results for input(s): LIPASE, AMYLASE in the last 8760 hours. No results for input(s): AMMONIA in the last 8760 hours. CBC: Recent Labs    04/05/19 0905 04/06/19 1245 05/28/19 0000 10/01/19 1139 01/12/20 1610 01/13/20 0519  WBC 8.2  8.1 8.7 8.7 10.2 8.3  NEUTROABS 5,666 6.0 7,439  --   --   --   HGB 12.9 12.4 14.1 12.5 13.1 12.2  HCT 37.4 39.0 41 41.1 42.2 38.9  MCV 97.9 105.4*  --  102.0* 100.7* 100.5*  PLT 297 268 336 324 326 272   Cardiac Enzymes: No results for input(s): CKTOTAL, CKMB, CKMBINDEX, TROPONINI in the last 8760 hours. BNP: Invalid input(s): POCBNP CBG: Recent Labs    01/12/20 1651  GLUCAP 103*    Procedures and Imaging Studies During Stay: CT Head Wo Contrast  Result Date: 01/12/2020 CLINICAL DATA:  Altered mental status.  Headache. EXAM: CT HEAD WITHOUT CONTRAST TECHNIQUE: Contiguous axial images were obtained from the base of the skull through the vertex without intravenous contrast. COMPARISON:  None. FINDINGS: Brain: Mild diffuse cortical atrophy is noted. Mild chronic ischemic white matter disease is noted. No mass effect or midline shift is noted. Ventricular size is within normal limits. There is no evidence of mass lesion, hemorrhage or acute infarction. Vascular: No hyperdense vessel or unexpected calcification. Skull: Normal. Negative for fracture or focal lesion. Sinuses/Orbits: No acute finding. Other: None. IMPRESSION: Mild diffuse cortical atrophy. Mild chronic ischemic white matter disease. No acute intracranial abnormality seen. Electronically Signed   By: Marijo Conception M.D.   On: 01/12/2020 19:44   MR BRAIN WO CONTRAST  Result Date: 01/13/2020 CLINICAL DATA:  Mental status change EXAM: MRI HEAD WITHOUT CONTRAST TECHNIQUE: Multiplanar, multiecho pulse sequences of the brain and  surrounding structures were obtained without intravenous contrast. COMPARISON:  None. FINDINGS: Brain: There is no acute infarction or intracranial hemorrhage. There is no intracranial mass, mass effect, or edema. There is no hydrocephalus or extra-axial fluid collection. Prominence of the ventricles and sulci reflects generalized parenchymal volume loss. Patchy foci of T2 hyperintensity in the supratentorial white matter are nonspecific but may reflect minor chronic microvascular ischemic changes. Vascular: Major vessel flow voids at the skull base are preserved. Skull and upper cervical spine: Normal marrow signal is preserved. Sinuses/Orbits: Left maxillary sinus retention cyst. Trace ethmoid mucosal thickening. Bilateral lens replacements. Other: Sella is unremarkable. Patchy right greater than left mastoid fluid opacification. IMPRESSION: No evidence of recent infarction, hemorrhage, or mass. Minor chronic microvascular ischemic changes. Electronically Signed   By: Macy Mis M.D.   On: 01/13/2020 11:49   VAS [place] LOWER EXTREMITY VENOUS (DVT)  Result Date: 01/13/2020  Lower Venous DVT Study Indications: Asymmetrical swelling of RT lower extremity.  Risk Factors: Surgery 10-05-2019 RT total knee arthroplasty. Comparison Study: No prior studies. Performing Technologist: Darlin Coco, RDMS  Examination Guidelines: A complete evaluation includes B-mode imaging, spectral Doppler, color Doppler, and power Doppler as needed of all accessible portions of each vessel. Bilateral testing is considered an integral part of a complete examination. Limited examinations for reoccurring indications may be performed as noted. The reflux portion of the exam is performed with the patient in reverse Trendelenburg.  +---------+---------------+---------+-----------+----------+--------------+ RIGHT    CompressibilityPhasicitySpontaneityPropertiesThrombus Aging  +---------+---------------+---------+-----------+----------+--------------+ CFV      Full           Yes      Yes                                 +---------+---------------+---------+-----------+----------+--------------+ SFJ      Full                                                        +---------+---------------+---------+-----------+----------+--------------+  FV Prox  Full                                                        +---------+---------------+---------+-----------+----------+--------------+ FV Mid   Full                                                        +---------+---------------+---------+-----------+----------+--------------+ FV DistalFull                                                        +---------+---------------+---------+-----------+----------+--------------+ PFV      Full                                                        +---------+---------------+---------+-----------+----------+--------------+ POP      Full           Yes      Yes                                 +---------+---------------+---------+-----------+----------+--------------+ PTV      Full                                                        +---------+---------------+---------+-----------+----------+--------------+ PERO     Full                                                        +---------+---------------+---------+-----------+----------+--------------+   +----+---------------+---------+-----------+----------+--------------+ LEFTCompressibilityPhasicitySpontaneityPropertiesThrombus Aging +----+---------------+---------+-----------+----------+--------------+ CFV Full           Yes      Yes                                 +----+---------------+---------+-----------+----------+--------------+     Summary: RIGHT: - There is no evidence of deep vein thrombosis in the lower extremity.  - No cystic structure found in the popliteal fossa.   LEFT: - No evidence of common femoral vein obstruction.  *See table(s) above for measurements and observations. Electronically signed by Servando Snare MD on 01/13/2020 at 8:20:37 PM.    Final     Assessment/Plan:   Cervico-occipital neuralgia Comes and goes, stiff neck, will try Lidocaine patch on 12hrs and off 12 hours.   Parkinsonism (HCC) Fine tremor in fingers, wobbly gait, stiff neck, continue Sinemet, f/u neurology.  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 Daily Psyllium .   Slow transit constipation Constipation. Prn Metamucil, MiraLaxqdare effective.  GERD (gastroesophageal reflux disease) GERD, stable, on Omeprazole $RemoveBefor'20mg'ctFZYiEjBdKw$  bid   Essential (primary) hypertension HTN, blood pressure is controlled on Losartan, Bun/creat 20/0.98 01/13/20   Bipolar I disorder, single manic episode, in full remission ([place]) Bipolar disorder, stable, GDR Lithium $RemoveBef'300mg'yJevUlgdng$  qd $R'150mg'IH$  qd insetting of slow HR, Alprazolam 0.$RemoveBeforeDE'5MG'PXYFXrXLuKjRSPj$  BID prn. TSH 2.734 01/12/20   Bradycardia Bradycardia, replased  from 50s  to 40s after initially normalized after Tylenol PM dc'd. HR in 40s, obtain EKG at [facility] vent rate 44, SR. EKG vent rate 51 01/12/20 in hospital. Will reduce Lithium to $RemoveBe'150mg'XtDpcEeZW$  qod, $Rem'300mg'ZqNt$  qod, cardiology referral. Observe.  VS daily.                                                                                                                                                                                                                  Edema Trace BLE, TED  Hypercalcemia Hypercalcemia, pending endocrinology, Ca 9.8 01/13/20, PTH 79 01/12/20.  Arthritis, degenerative  OA takes  Tylenol, s/p R knee arthroplasty.   CKD (chronic kidney disease) stage 3, GFR 30-59 ml/min (HCC) 01/25/20 Na 139, K 5.1, Bun 19, creat 0.93, eGFR 58, wbc 7.1, Hgb 11.7, plt 294, neutrophils 57%   Patient is being  discharged with the following home health services:    Patient is being discharged with the following durable medical equipment:    Patient has been advised to f/u with their PCP in 1-2 weeks to bring them up to date on their rehab stay.  Social services at facility was responsible for arranging this appointment.  Pt was provided with a 30 day supply of prescriptions for medications and refills must be obtained from their PCP.  For controlled substances, a more limited supply may be provided adequate until PCP appointment only.  Future labs/tests needed:  EKG

## 2020-01-26 NOTE — Assessment & Plan Note (Signed)
HTN, blood pressure is controlled on Losartan, Bun/creat 20/0.98 01/13/20

## 2020-01-26 NOTE — Assessment & Plan Note (Signed)
OA takes  Tylenol, s/p R knee arthroplasty.

## 2020-01-26 NOTE — Assessment & Plan Note (Signed)
Hypercalcemia, pending endocrinology, Ca 9.8 01/13/20, PTH 79 01/12/20.

## 2020-01-26 NOTE — Assessment & Plan Note (Signed)
Trace BLE, TED

## 2020-01-26 NOTE — Assessment & Plan Note (Signed)
Constipation. Prn Metamucil, MiraLaxqdare effective. 

## 2020-01-26 NOTE — Assessment & Plan Note (Addendum)
Bipolar disorder, stable, GDR Lithium 300mg  qd 150mg  qd insetting of slow HR, Alprazolam 0.5MG  BID prn. TSH 2.734 01/12/20

## 2020-01-26 NOTE — Assessment & Plan Note (Signed)
Fine tremor in fingers, wobbly gait, stiff neck, continue Sinemet, f/u neurology.

## 2020-01-26 NOTE — Assessment & Plan Note (Signed)
01/25/20 Na 139, K 5.1, Bun 19, creat 0.93, eGFR 58, wbc 7.1, Hgb 11.7, plt 294, neutrophils 57%

## 2020-01-26 NOTE — Assessment & Plan Note (Signed)
Comes and goes, stiff neck, will try Lidocaine patch on 12hrs and off 12 hours.

## 2020-01-26 NOTE — Assessment & Plan Note (Signed)
Hx of GIST of small intestine s/p resection. prn MiraLax. Daily Psyllium . 

## 2020-01-26 NOTE — Assessment & Plan Note (Signed)
GERD, stable, on Omeprazole 20mg  bid

## 2020-01-26 NOTE — Assessment & Plan Note (Addendum)
Bradycardia, replased  from 50s  to 40s after initially normalized after Tylenol PM dc'd. HR in 40s, obtain EKG at [facility] vent rate 44, SR. EKG vent rate 51 01/12/20 in hospital. Will reduce Lithium to 150mg  qod, 300mg  qod, cardiology referral. Observe.  VS daily.

## 2020-01-27 ENCOUNTER — Encounter: Payer: Self-pay | Admitting: Nurse Practitioner

## 2020-02-01 ENCOUNTER — Encounter: Payer: Self-pay | Admitting: Internal Medicine

## 2020-02-01 ENCOUNTER — Non-Acute Institutional Stay: Payer: Medicare PPO | Admitting: Internal Medicine

## 2020-02-01 DIAGNOSIS — F304 Manic episode in full remission: Secondary | ICD-10-CM

## 2020-02-01 DIAGNOSIS — H9313 Tinnitus, bilateral: Secondary | ICD-10-CM

## 2020-02-01 DIAGNOSIS — G20 Parkinson's disease: Secondary | ICD-10-CM

## 2020-02-01 DIAGNOSIS — R001 Bradycardia, unspecified: Secondary | ICD-10-CM | POA: Diagnosis not present

## 2020-02-01 NOTE — Progress Notes (Signed)
Location: [facility] Room Number: 916 Place of Service:  ALF (13)  Provider:   Code Status: Managed Care Goals of Care:  Advanced Directives 01/26/2020  Does Patient Have a Medical Advance Directive? Yes  Type of Paramedic of [place];Out of facility DNR (pink MOST or yellow form)  Does patient want to make changes to medical advance directive? No - Patient declined  Copy of [place] in Chart? Yes - validated most recent copy scanned in chart (See row information)  Would patient like information on creating a medical advance directive? -  Pre-existing out of facility DNR order (yellow form or pink MOST form) -     Chief Complaint  Patient presents with  . Acute Visit    Bradycardia and Tinnitus    HPI: Patient is a 80 y.o. 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 She was seen by neurology and was started on Sinemet.  Patient made dramatic improvement  Now is in AL before going back to her Apartment Bradycardia Nurses notice low HR in 40. EKG done showed Sinus Bradycardia of 40 Patient asymptomatic Lithium dose was changed. Since then HR improved in 50s now Tinnitus New issue since this morning No ear pain Does have nasal congestion. No Sinus pain. No fever. No Hearing issue. No Vertigo Neck pain Left side with jaw pain. Has had imaging before for this Found no acute issue Walking with her walker No  F[place] Past Medical History:  Diagnosis Date  . Anxiety   . Arthritis   . Cardiac conduction disorder 03/30/2012   Overview:  STORY: ETT 03/09/2012 Echo 03/07/2012 normal Dr Einar Gip, bradycardia felt due to glaucoma eye drops  . Diverticulosis of colon   . GIST (gastrointestinal stroma tumor), malignant, colon ([place])   . Heart murmur   . History of colon polyps 10/24/2008  . Hypertension   . Major neurocognitive disorder due to Parkinson's disease, possible    Tremors possible parkinsons  . Manic disorder, single episode, in full remission ([place]) 12/01/2009  . Sixth nerve palsy     Past Surgical History:  Procedure Laterality Date  . BILATERAL SALPINGOOPHORECTOMY  09/22/2007  . Gastrointestinal Stroma Tumor,  Other  1960   GIST Surgery  . ILEOCECETOMY  09/22/2007  . OVARIAN CYST REMOVAL Right 1967  . SMALL INTESTINE SURGERY  09/22/2007  . TOTAL KNEE ARTHROPLASTY Right 10/05/2019   Procedure: RIGHT TOTAL KNEE ARTHROPLASTY;  Surgeon: Meredith Pel, MD;  Location: [facility];  Service: Orthopedics;  Laterality: Right;  . TOTAL VAGINAL HYSTERECTOMY  1986   Fibroids    Allergies  Allergen Reactions  . Lisinopril Cough  . Penicillins Itching    50 years ago  . Adhesive [Tape] Itching  . Atorvastatin Itching  . Dilaudid [Hydromorphone Hcl] Itching  . Hydromorphone Itching    Outpatient Encounter Medications as of 02/01/2020  Medication Sig  . acetaminophen (TYLENOL) 325 MG tablet Take 650 mg by mouth every 4 (four) hours as needed.  . ALPRAZolam (XANAX) 0.5  MG tablet Take 1 tablet (0.5 mg total) by mouth 2 (two) times daily as needed for anxiety.  Marland Kitchen aspirin EC 81 MG tablet Take 1 tablet (81 mg total) by mouth daily.  . carbidopa-levodopa (SINEMET IR) 10-100 MG tablet Take 1 tablet by mouth 3 (three) times daily.  . dorzolamide (TRUSOPT) 2 % ophthalmic solution Place 1 drop into both eyes 2 (two) times daily.  Marland Kitchen latanoprost (XALATAN) 0.005 % ophthalmic solution Place 1 drop into both eyes  at bedtime.  . Lidocaine-Menthol 4-1 % PTCH Apply topically. 1 PATCH; topical PLACE ON NECK /NAPE Once A Day  . lithium carbonate 150 MG capsule Take 150 mg by mouth daily.  Marland Kitchen lithium carbonate 300 MG capsule Take 300 mg by mouth daily.  Marland Kitchen losartan (COZAAR) 50 MG tablet Take 1 tablet (50 mg total) by mouth daily.  Marland Kitchen omeprazole (PRILOSEC) 20 MG capsule Take 1 capsule (20 mg total) by mouth in the morning and at bedtime.  . polyethylene glycol (MIRALAX / GLYCOLAX) 17 g packet Take 17 g by mouth daily.  . [DISCONTINUED] Cobalamin Combinations (VITAMIN B12-FOLIC ACID PO) Take XX123456 mcg by mouth daily in the afternoon.  . [DISCONTINUED] lithium carbonate 300 MG capsule TAKE 1 CAPSULE TWICE DAILY WITH MEALS.   No facility-administered encounter medications on file as of 02/01/2020.    Review of Systems:  Review of Systems  Constitutional: Positive for activity change.  HENT: Positive for postnasal drip, rhinorrhea, sinus pressure and tinnitus.   Respiratory: Negative.   Cardiovascular: Negative.   Gastrointestinal: Negative.   Genitourinary: Negative.   Musculoskeletal: Positive for neck pain and neck stiffness.  Skin: Negative.   Neurological: Negative for dizziness.  Psychiatric/Behavioral: Negative.     Health Maintenance  Topic Date Due  . Hepatitis C Screening  Never done  . COVID-19 Vaccine (3 - Moderna risk 4-dose series) 03/15/2019  . TETANUS/TDAP  08/19/2021  . INFLUENZA VACCINE  Completed  . DEXA SCAN  Completed  . PNA vac Low Risk Adult  Completed    Physical Exam: Vitals:   02/01/20 1450  BP: 130/70  Pulse: 60  Resp: 18  Temp: 97.9 F (36.6 C)  SpO2: 94%  Weight: 146 lb 12.8 oz (66.6 kg)  Height: 5\' 2"  (1.575 m)   Body mass index is 26.85 kg/m. Physical Exam Constitutional: Oriented to person, place, and time. Well-developed and well-nourished.  HENT:  Head: Normocephalic.  Ears TM normal no Wax Mouth/Throat: Oropharynx is clear and moist.  Eyes: Pupils  are equal, round, and reactive to light.  Neck: Neck supple. Able to move with no issues Cardiovascular: Normal rate and normal heart sounds.  No murmur heard. Pulmonary/Chest: Effort normal and breath sounds normal. No respiratory distress. No wheezes. She has no rales.  Abdominal: Soft. Bowel sounds are normal. No distension. There is no tenderness. There is no rebound.  Musculoskeletal: No edema.  Lymphadenopathy: none Neurological: Alert and oriented to person, place, and time.  Walking with her walker Skin: Skin is warm and dry.  Psychiatric: Normal mood and affect. Behavior is normal. Thought content normal.   Labs reviewed: Basic Metabolic Panel: Recent Labs    04/05/19 0905 04/06/19 1245 10/01/19 1139 01/12/20 1610 01/12/20 2122 01/12/20 2123 01/13/20 0519 01/26/20 0000  NA 140   < > 140 141  --   --  143 139  K 4.3   < > 4.4 4.9  --   --  3.9 5.1  CL 109   < > 109  112*  --   --  115* 109*  CO2 23   < > 24 22  --   --  22 25*  GLUCOSE 127*   < > 91 105*  --   --  102*  --   BUN 16   < > 20 18  --   --  20 19  CREATININE 0.91   < > 0.84 1.13*  --   --  0.98 0.9  CALCIUM 11.4*   < > 10.9* 10.8*  --  10.5* 9.8 10.0  TSH 2.94  --   --   --  2.734  --   --   --    < > = values in this interval not displayed.   Liver Function Tests: Recent Labs    04/05/19 0905 04/05/19 0905 04/06/19 1245 05/28/19 0000 01/12/20 1610 01/26/20 0000  AST 19  --  23 22 43* 11*  ALT 13  --  18 13 23 12   ALKPHOS  --    < > 75 101 85 81  BILITOT 0.4  --  0.5  --  0.8  --   PROT 6.9  --  6.7  --  7.2  --   ALBUMIN  --    < > 4.0 4.6 4.2 3.5   < > = values in this interval not displayed.   No results for input(s): LIPASE, AMYLASE in the last 8760 hours. No results for input(s): AMMONIA in the last 8760 hours. CBC: Recent Labs    04/06/19 1245 05/28/19 0000 10/01/19 1139 01/12/20 1610 01/13/20 0519 01/26/20 0000  WBC 8.1 8.7 8.7 10.2 8.3 7.1  NEUTROABS 6.0 7,439  --   --   --   4,828.00  HGB 12.4 14.1 12.5 13.1 12.2 11.7*  HCT 39.0 41 41.1 42.2 38.9 35*  MCV 105.4*  --  102.0* 100.7* 100.5*  --   PLT 268 336 324 326 272 294   Lipid Panel: Recent Labs    04/05/19 0905  CHOL 284*  HDL 73  LDLCALC 173*  TRIG 225*  CHOLHDL 3.9   No results found for: HGBA1C  Procedures since last visit: CT Head Wo Contrast  Result Date: 01/12/2020 CLINICAL DATA:  Altered mental status.  Headache. EXAM: CT HEAD WITHOUT CONTRAST TECHNIQUE: Contiguous axial images were obtained from the base of the skull through the vertex without intravenous contrast. COMPARISON:  None. FINDINGS: Brain: Mild diffuse cortical atrophy is noted. Mild chronic ischemic white matter disease is noted. No mass effect or midline shift is noted. Ventricular size is within normal limits. There is no evidence of mass lesion, hemorrhage or acute infarction. Vascular: No hyperdense vessel or unexpected calcification. Skull: Normal. Negative for fracture or focal lesion. Sinuses/Orbits: No acute finding. Other: None. IMPRESSION: Mild diffuse cortical atrophy. Mild chronic ischemic white matter disease. No acute intracranial abnormality seen. Electronically Signed   By: Marijo Conception M.D.   On: 01/12/2020 19:44   MR BRAIN WO CONTRAST  Result Date: 01/13/2020 CLINICAL DATA:  Mental status change EXAM: MRI HEAD WITHOUT CONTRAST TECHNIQUE: Multiplanar, multiecho pulse sequences of the brain and surrounding structures were obtained without intravenous contrast. COMPARISON:  None. FINDINGS: Brain: There is no acute infarction or intracranial hemorrhage. There is no intracranial mass, mass effect, or edema. There is no hydrocephalus or extra-axial fluid collection. Prominence of the ventricles and sulci reflects generalized parenchymal volume loss. Patchy foci of T2 hyperintensity in the supratentorial white matter are nonspecific but may reflect  minor chronic microvascular ischemic changes. Vascular: Major vessel flow  voids at the skull base are preserved. Skull and upper cervical spine: Normal marrow signal is preserved. Sinuses/Orbits: Left maxillary sinus retention cyst. Trace ethmoid mucosal thickening. Bilateral lens replacements. Other: Sella is unremarkable. Patchy right greater than left mastoid fluid opacification. IMPRESSION: No evidence of recent infarction, hemorrhage, or mass. Minor chronic microvascular ischemic changes. Electronically Signed   By: Macy Mis M.D.   On: 01/13/2020 11:49   VAS [place] LOWER EXTREMITY VENOUS (DVT)  Result Date: 01/13/2020  Lower Venous DVT Study Indications: Asymmetrical swelling of RT lower extremity.  Risk Factors: Surgery 10-05-2019 RT total knee arthroplasty. Comparison Study: No prior studies. Performing Technologist: Darlin Coco, RDMS  Examination Guidelines: A complete evaluation includes B-mode imaging, spectral Doppler, color Doppler, and power Doppler as needed of all accessible portions of each vessel. Bilateral testing is considered an integral part of a complete examination. Limited examinations for reoccurring indications may be performed as noted. The reflux portion of the exam is performed with the patient in reverse Trendelenburg.  +---------+---------------+---------+-----------+----------+--------------+ RIGHT    CompressibilityPhasicitySpontaneityPropertiesThrombus Aging +---------+---------------+---------+-----------+----------+--------------+ CFV      Full           Yes      Yes                                 +---------+---------------+---------+-----------+----------+--------------+ SFJ      Full                                                        +---------+---------------+---------+-----------+----------+--------------+ FV Prox  Full                                                        +---------+---------------+---------+-----------+----------+--------------+ FV Mid   Full                                                         +---------+---------------+---------+-----------+----------+--------------+ FV DistalFull                                                        +---------+---------------+---------+-----------+----------+--------------+ PFV      Full                                                        +---------+---------------+---------+-----------+----------+--------------+ POP      Full           Yes      Yes                                 +---------+---------------+---------+-----------+----------+--------------+  PTV      Full                                                        +---------+---------------+---------+-----------+----------+--------------+ PERO     Full                                                        +---------+---------------+---------+-----------+----------+--------------+   +----+---------------+---------+-----------+----------+--------------+ LEFTCompressibilityPhasicitySpontaneityPropertiesThrombus Aging +----+---------------+---------+-----------+----------+--------------+ CFV Full           Yes      Yes                                 +----+---------------+---------+-----------+----------+--------------+     Summary: RIGHT: - There is no evidence of deep vein thrombosis in the lower extremity.  - No cystic structure found in the popliteal fossa.  LEFT: - No evidence of common femoral vein obstruction.  *See table(s) above for measurements and observations. Electronically signed by Servando Snare MD on 01/13/2020 at 8:20:37 PM.    Final     Assessment/Plan Tinnitus of both ears ? Otitis Will try Flonase for 1 week If not better ENT consult  Bradycardia Change Lithium to 150 mg BID HR between 45-60 now Asymptomatic Hold Cardiology Consult  Parkinson's disease [facility]) Doing better with Sinemet Plan for discharge to her apartment Walking with her Gilford Rile.  Plan to follow with Neurology  Neck stiffness MRI Mild DJD of  Cervical spine in 08/2018 ? Due to Parkinson Tylenol for pain  Hypercalcemia Repeat Calcium level after decraseing Lithium  Bipolar I disorder, single manic episode, in full remission ([place]) Has done well will decrease the Lithium to 150 mg BID for now Hyperlipidemia Will Need follow Listed as allergic to Statin   Labs/tests ordered:  * No order type specified * Next appt:  Visit date not found  Total time spent in this patient care encounter was  45_  minutes; greater than 50% of the visit spent counseling patient and staff, reviewing records , Labs and coordinating care for problems addressed at this encounter.

## 2020-02-02 ENCOUNTER — Encounter: Payer: Self-pay | Admitting: Nurse Practitioner

## 2020-02-02 ENCOUNTER — Non-Acute Institutional Stay: Payer: Medicare PPO | Admitting: Nurse Practitioner

## 2020-02-02 DIAGNOSIS — F304 Manic episode in full remission: Secondary | ICD-10-CM | POA: Diagnosis not present

## 2020-02-02 DIAGNOSIS — R001 Bradycardia, unspecified: Secondary | ICD-10-CM | POA: Diagnosis not present

## 2020-02-02 DIAGNOSIS — K219 Gastro-esophageal reflux disease without esophagitis: Secondary | ICD-10-CM

## 2020-02-02 DIAGNOSIS — K5901 Slow transit constipation: Secondary | ICD-10-CM

## 2020-02-02 DIAGNOSIS — M159 Polyosteoarthritis, unspecified: Secondary | ICD-10-CM

## 2020-02-02 DIAGNOSIS — M5481 Occipital neuralgia: Secondary | ICD-10-CM

## 2020-02-02 DIAGNOSIS — H9313 Tinnitus, bilateral: Secondary | ICD-10-CM

## 2020-02-02 DIAGNOSIS — R609 Edema, unspecified: Secondary | ICD-10-CM | POA: Diagnosis not present

## 2020-02-02 DIAGNOSIS — I10 Essential (primary) hypertension: Secondary | ICD-10-CM

## 2020-02-02 DIAGNOSIS — G20 Parkinson's disease: Secondary | ICD-10-CM

## 2020-02-02 DIAGNOSIS — C49A3 Gastrointestinal stromal tumor of small intestine: Secondary | ICD-10-CM

## 2020-02-02 DIAGNOSIS — M8949 Other hypertrophic osteoarthropathy, multiple sites: Secondary | ICD-10-CM

## 2020-02-02 NOTE — Assessment & Plan Note (Signed)
Hx of GIST of small intestine s/p resection. prn MiraLax. Daily Psyllium . 

## 2020-02-02 NOTE — Assessment & Plan Note (Signed)
Hx of Parkinson's, stable(diplopiaR+L lateral,tremor in fingers), on Sinemet f/u Neurology.

## 2020-02-02 NOTE — Assessment & Plan Note (Signed)
Bradycardia, improved from 50s from 40s,Hx of normalized HR after Tylenol PM dc'd, GDR of Lithium

## 2020-02-02 NOTE — Assessment & Plan Note (Signed)
HTN, blood pressure is controlled on Losartan, Bun/creat 19/0.9 01/26/20

## 2020-02-02 NOTE — Assessment & Plan Note (Signed)
OA, ttakes Tylenol, s/p R knee arthroplasty. 

## 2020-02-02 NOTE — Assessment & Plan Note (Signed)
Hypercalcemia,pending endocrinology, Ca10 01/26/20,PTH 79 01/12/20. GDR of Lithium may help  

## 2020-02-02 NOTE — Progress Notes (Signed)
Location:   [place] Room Number: [id number] Place of Service:  ALF (13) Provider: Lennie Odor Katarina Riebe NP  [name], MD  Patient Care Team: [name], MD as PCP - General (Internal Medicine) Marti Sleigh, MD as Consulting Physician (Gynecologic Oncology) Ladell Pier, MD as Consulting Physician (Oncology) Richmond Campbell, MD as Consulting Physician (Gastroenterology) Clent Jacks, MD as Consulting Physician (Ophthalmology) Jacques Navy, MD as Referring Physician (Neurology)  Extended Emergency Contact Information Primary Emergency Contact: Serena Colonel" Address: [address]          [place], [place] [address] [name] of [place] Mobile Phone: [phone] Relation: Son  Code Status: DNR Goals of care: Advanced Directive information Advanced Directives 02/02/2020  Does Patient Have a Medical Advance Directive? Yes  Type of Advance Directive [place]  Does patient want to make changes to medical advance directive? No - Patient declined  Copy of [place] in Chart? Yes - validated most recent copy scanned in chart (See row information)  Would patient like information on creating a medical advance directive? -  Pre-existing out of facility DNR order (yellow form or pink MOST form) -     Chief Complaint  Patient presents with  . Acute Visit    Medication review.     HPI:  Pt is a 80 y.o. female seen today for an acute visit for review medications, discharge from AL to [place] after transitional stay following SNF [facility] for therapy. The patient has regained her baseline cognition, mentation, and ADL function prior to hospitalization, she is ready to return to [place], continue to be followed in clinic [facility]  The patient was admitted to [facility] following hospital stay 01/12/20-01/18/20 for headache, confusion, change of speech pattern, functioning decline. MRI brain showed chronic microvascular ischemic changes,  no acute disease with mild cortical atrophy. Her symptoms are improved after Sinemet started per neurology. She was admitted to [facility] for therapy.  Hx of GIST of small intestine s/p resection.prn MiraLax. Daily Psyllium . Constipation. Prn Metamucil, MiraLaxqdare effective. Hx of Parkinson's, stable(diplopiaR+L lateral,tremor in fingers), on Sinemet f/u Neurology.  GERD, stable, on Omeprazole 20mg  bid, Hgb 11.7 01/26/20.  HTN, blood pressure is controlled on Losartan, Bun/creat 19/0.9 01/26/20 Bipolar disorder, stable, on Lithium 150mg  bid gradually, Alprazolam 0.5MG  BID prn. TSH 2.734 01/12/20 Bradycardia, improved from 50s from 40s,Hx of normalized HR after Tylenol PM dc'd, GDR of Lithium Hypercalcemia, pending endocrinology, Ca 10 01/26/20,  PTH 79 01/12/20. GDR of Lithium may help             BLE trace edema              OA, ttakes Tylenol, s/p R knee arthroplasty.   Cervico occipital neuralgia, Parkinson's, cervical degenerative changes, aches in neck R>L travels to the back of head, comes and goes, not disabling.    Past Medical History:  Diagnosis Date  . Anxiety   . Arthritis   . Cardiac conduction disorder 03/30/2012   Overview:  STORY: ETT 03/09/2012 Echo 03/07/2012 normal Dr Einar Gip, bradycardia felt due to glaucoma eye drops  . Diverticulosis of colon   . GIST (gastrointestinal stroma tumor), malignant, colon ([place])   . Heart murmur   . History of colon polyps 10/24/2008  . Hypertension   . Major neurocognitive disorder due to Parkinson's disease, possible    Tremors possible parkinsons  . Manic disorder, single episode, in full remission ([place]) 12/01/2009  .  Sixth nerve palsy    Past Surgical History:  Procedure Laterality Date  . BILATERAL SALPINGOOPHORECTOMY  09/22/2007  . Gastrointestinal Stroma Tumor,  Other  1960   GIST Surgery  . ILEOCECETOMY  09/22/2007  . OVARIAN CYST REMOVAL  Right 1967  . SMALL INTESTINE SURGERY  09/22/2007  . TOTAL KNEE ARTHROPLASTY Right 10/05/2019   Procedure: RIGHT TOTAL KNEE ARTHROPLASTY;  Surgeon: Meredith Pel, MD;  Location: [facility];  Service: Orthopedics;  Laterality: Right;  . TOTAL VAGINAL HYSTERECTOMY  1986   Fibroids    Allergies  Allergen Reactions  . Lisinopril Cough  . Penicillins Itching    50 years ago  . Adhesive [Tape] Itching  . Atorvastatin Itching  . Dilaudid [Hydromorphone Hcl] Itching  . Hydromorphone Itching    Allergies as of 02/02/2020      Reactions   Lisinopril Cough   Penicillins Itching   50 years ago   Adhesive [tape] Itching   Atorvastatin Itching   Dilaudid [hydromorphone Hcl] Itching   Hydromorphone Itching      Medication List       Accurate as of February 02, 2020 11:59 PM. If you have any questions, ask your nurse or doctor.        acetaminophen 325 MG tablet Commonly known as: TYLENOL Take 650 mg by mouth every 4 (four) hours as needed.   ALPRAZolam 0.5 MG tablet Commonly known as: XANAX Take 1 tablet (0.5 mg total) by mouth 2 (two) times daily as needed for anxiety.   aspirin EC 81 MG tablet Take 1 tablet (81 mg total) by mouth daily.   carbidopa-levodopa 10-100 MG tablet Commonly known as: SINEMET IR Take 1 tablet by mouth 3 (three) times daily.   dorzolamide 2 % ophthalmic solution Commonly known as: TRUSOPT Place 1 drop into both eyes 2 (two) times daily.   latanoprost 0.005 % ophthalmic solution Commonly known as: XALATAN Place 1 drop into both eyes at bedtime.   Lidocaine-Menthol 4-1 % Ptch Apply topically. 1 PATCH; topical PLACE ON NECK /NAPE Once A Day   lithium carbonate 150 MG capsule Take 150 mg by mouth daily. What changed: Another medication with the same name was removed. Continue taking this medication, and follow the directions you see here. Changed by: Hondo Nanda X Kapil Petropoulos, NP   losartan 50 MG tablet Commonly known as: COZAAR Take 1 tablet (50 mg total)  by mouth daily.   omeprazole 20 MG capsule Commonly known as: PRILOSEC Take 1 capsule (20 mg total) by mouth in the morning and at bedtime.   polyethylene glycol 17 g packet Commonly known as: MIRALAX / GLYCOLAX Take 17 g by mouth daily.       Review of Systems  Constitutional: Negative for appetite change, fatigue and fever.  HENT: Positive for hearing loss and tinnitus. Negative for congestion and trouble swallowing.   Eyes: Negative for visual disturbance.       Glaucoma, diplopia R+L lateral peripheral visual fields only.   Respiratory: Negative for cough and shortness of breath.        Occasionally DOE, hacking cough  Cardiovascular: Positive for leg swelling.  Gastrointestinal: Negative for abdominal pain and constipation.  Genitourinary: Negative for dysuria, frequency and urgency.  Musculoskeletal: Positive for arthralgias and gait problem.       Walker.   Skin: Negative for color change.  Neurological: Positive for tremors and headaches. Negative for dizziness, facial asymmetry, speech difficulty and light-headedness.       Tremors in hands/fingers  at rest. L>R, progressing, comes and goes nature of the neck/occipital pain. Stiff neck. Pain the patent right knee, s/p TKR, slightly warm/swelling.   Psychiatric/Behavioral: Positive for confusion. Negative for behavioral problems and sleep disturbance. The patient is not nervous/anxious.     Immunization History  Administered Date(s) Administered  . Influenza Split 10/24/2008, 09/26/2009, 10/08/2011, 10/05/2012  . Influenza, High Dose Seasonal PF 11/02/2013, 11/02/2013, 11/03/2017, 10/26/2018, 10/27/2019  . Influenza,inj,Quad PF,6+ Mos 11/01/2014, 10/11/2015, 10/11/2016  . Influenza-Unspecified 11/02/2013, 10/11/2016  . Moderna Sars-Covid-2 Vaccination 01/18/2019, 02/15/2019  . Pneumococcal Conjugate-13 08/20/2011  . Pneumococcal Polysaccharide-23 01/14/2005, 06/29/2014  . Pneumococcal-Unspecified 01/14/2005, 06/29/2014   . Tdap 08/20/2011  . Zoster 10/08/2011  . Zoster Recombinat (Shingrix) 11/09/2018, 12/01/2018, 03/01/2019   Pertinent  Health Maintenance Due  Topic Date Due  . INFLUENZA VACCINE  Completed  . DEXA SCAN  Completed  . PNA vac Low Risk Adult  Completed   Fall Risk  07/01/2019 04/22/2019 04/08/2019  Falls in the past year? 0 0 0  Number falls in past yr: 0 0 0   Functional Status Survey:    Vitals:   02/02/20 1143  BP: 128/82  Pulse: (!) 59  Resp: 18  Temp: (!) 97 F (36.1 C)  SpO2: 94%  Weight: 137 lb 12.8 oz (62.5 kg)  Height: 5' (1.524 m)   Body mass index is 26.91 kg/m. Physical Exam Vitals and nursing note reviewed.  Constitutional:      Appearance: Normal appearance.  HENT:     Head: Normocephalic and atraumatic.     Mouth/Throat:     Mouth: Mucous membranes are moist.  Eyes:     Extraocular Movements: Extraocular movements intact.     Conjunctiva/sclera: Conjunctivae normal.     Pupils: Pupils are equal, round, and reactive to light.  Cardiovascular:     Rate and Rhythm: Regular rhythm. Bradycardia present.     Heart sounds: No murmur heard.     Comments: HR in 50s Pulmonary:     Breath sounds: No rales.  Abdominal:     General: Bowel sounds are normal.     Palpations: Abdomen is soft.     Tenderness: There is no abdominal tenderness.  Musculoskeletal:        General: No tenderness.     Cervical back: Normal range of motion and neck supple.     Right lower leg: Edema present.     Left lower leg: Edema present.     Comments: Chronic R knee pain is improved. S/p TKR right, slightly warmth and swelling of the right knee. Stiff neck, comes and goes neck/occipital pain.   Skin:    General: Skin is warm and dry.  Neurological:     General: No focal deficit present.     Mental Status: She is alert and oriented to person, place, and time. Mental status is at baseline.     Motor: No weakness.     Coordination: Coordination abnormal.     Gait: Gait abnormal.      Comments: Resting tremor in fingers, improved.   Psychiatric:        Mood and Affect: Mood normal.        Behavior: Behavior normal.        Thought Content: Thought content normal.        Judgment: Judgment normal.     Labs reviewed: Recent Labs    10/01/19 1139 01/12/20 1610 01/12/20 2123 01/13/20 0519 01/26/20 0000  NA 140 141  --  143  139  K 4.4 4.9  --  3.9 5.1  CL 109 112*  --  115* 109*  CO2 24 22  --  22 25*  GLUCOSE 91 105*  --  102*  --   BUN 20 18  --  20 19  CREATININE 0.84 1.13*  --  0.98 0.9  CALCIUM 10.9* 10.8* 10.5* 9.8 10.0   Recent Labs    04/05/19 0905 04/05/19 0905 04/06/19 1245 05/28/19 0000 01/12/20 1610 01/26/20 0000  AST 19  --  23 22 43* 11*  ALT 13  --  18 13 23 12   ALKPHOS  --    < > 75 101 85 81  BILITOT 0.4  --  0.5  --  0.8  --   PROT 6.9  --  6.7  --  7.2  --   ALBUMIN  --    < > 4.0 4.6 4.2 3.5   < > = values in this interval not displayed.   Recent Labs    04/06/19 1245 05/28/19 0000 10/01/19 1139 01/12/20 1610 01/13/20 0519 01/26/20 0000  WBC 8.1 8.7 8.7 10.2 8.3 7.1  NEUTROABS 6.0 7,439  --   --   --  4,828.00  HGB 12.4 14.1 12.5 13.1 12.2 11.7*  HCT 39.0 41 41.1 42.2 38.9 35*  MCV 105.4*  --  102.0* 100.7* 100.5*  --   PLT 268 336 324 326 272 294   Lab Results  Component Value Date   TSH 2.734 01/12/2020   No results found for: HGBA1C Lab Results  Component Value Date   CHOL 284 (H) 04/05/2019   HDL 73 04/05/2019   LDLCALC 173 (H) 04/05/2019   TRIG 225 (H) 04/05/2019   CHOLHDL 3.9 04/05/2019    Significant Diagnostic Results in last 30 days:  CT Head Wo Contrast  Result Date: 01/12/2020 CLINICAL DATA:  Altered mental status.  Headache. EXAM: CT HEAD WITHOUT CONTRAST TECHNIQUE: Contiguous axial images were obtained from the base of the skull through the vertex without intravenous contrast. COMPARISON:  None. FINDINGS: Brain: Mild diffuse cortical atrophy is noted. Mild chronic ischemic white matter  disease is noted. No mass effect or midline shift is noted. Ventricular size is within normal limits. There is no evidence of mass lesion, hemorrhage or acute infarction. Vascular: No hyperdense vessel or unexpected calcification. Skull: Normal. Negative for fracture or focal lesion. Sinuses/Orbits: No acute finding. Other: None. IMPRESSION: Mild diffuse cortical atrophy. Mild chronic ischemic white matter disease. No acute intracranial abnormality seen. Electronically Signed   By: Marijo Conception M.D.   On: 01/12/2020 19:44   MR BRAIN WO CONTRAST  Result Date: 01/13/2020 CLINICAL DATA:  Mental status change EXAM: MRI HEAD WITHOUT CONTRAST TECHNIQUE: Multiplanar, multiecho pulse sequences of the brain and surrounding structures were obtained without intravenous contrast. COMPARISON:  None. FINDINGS: Brain: There is no acute infarction or intracranial hemorrhage. There is no intracranial mass, mass effect, or edema. There is no hydrocephalus or extra-axial fluid collection. Prominence of the ventricles and sulci reflects generalized parenchymal volume loss. Patchy foci of T2 hyperintensity in the supratentorial white matter are nonspecific but may reflect minor chronic microvascular ischemic changes. Vascular: Major vessel flow voids at the skull base are preserved. Skull and upper cervical spine: Normal marrow signal is preserved. Sinuses/Orbits: Left maxillary sinus retention cyst. Trace ethmoid mucosal thickening. Bilateral lens replacements. Other: Sella is unremarkable. Patchy right greater than left mastoid fluid opacification. IMPRESSION: No evidence of recent infarction, hemorrhage, or mass. Minor  chronic microvascular ischemic changes. Electronically Signed   By: Macy Mis M.D.   On: 01/13/2020 11:49   VAS [place] LOWER EXTREMITY VENOUS (DVT)  Result Date: 01/13/2020  Lower Venous DVT Study Indications: Asymmetrical swelling of RT lower extremity.  Risk Factors: Surgery 10-05-2019 RT total knee  arthroplasty. Comparison Study: No prior studies. Performing Technologist: Darlin Coco, RDMS  Examination Guidelines: A complete evaluation includes B-mode imaging, spectral Doppler, color Doppler, and power Doppler as needed of all accessible portions of each vessel. Bilateral testing is considered an integral part of a complete examination. Limited examinations for reoccurring indications may be performed as noted. The reflux portion of the exam is performed with the patient in reverse Trendelenburg.  +---------+---------------+---------+-----------+----------+--------------+ RIGHT    CompressibilityPhasicitySpontaneityPropertiesThrombus Aging +---------+---------------+---------+-----------+----------+--------------+ CFV      Full           Yes      Yes                                 +---------+---------------+---------+-----------+----------+--------------+ SFJ      Full                                                        +---------+---------------+---------+-----------+----------+--------------+ FV Prox  Full                                                        +---------+---------------+---------+-----------+----------+--------------+ FV Mid   Full                                                        +---------+---------------+---------+-----------+----------+--------------+ FV DistalFull                                                        +---------+---------------+---------+-----------+----------+--------------+ PFV      Full                                                        +---------+---------------+---------+-----------+----------+--------------+ POP      Full           Yes      Yes                                 +---------+---------------+---------+-----------+----------+--------------+ PTV      Full                                                         +---------+---------------+---------+-----------+----------+--------------+  PERO     Full                                                        +---------+---------------+---------+-----------+----------+--------------+   +----+---------------+---------+-----------+----------+--------------+ LEFTCompressibilityPhasicitySpontaneityPropertiesThrombus Aging +----+---------------+---------+-----------+----------+--------------+ CFV Full           Yes      Yes                                 +----+---------------+---------+-----------+----------+--------------+     Summary: RIGHT: - There is no evidence of deep vein thrombosis in the lower extremity.  - No cystic structure found in the popliteal fossa.  LEFT: - No evidence of common femoral vein obstruction.  *See table(s) above for measurements and observations. Electronically signed by Servando Snare MD on 01/13/2020 at 8:20:37 PM.    Final     Assessment/Plan: Bipolar I disorder, single manic episode, in full remission ([place]) Bipolar disorder, stable, on Lithium 150mg  bid gradually GDR in setting of bradycardia, cognitive decline, hypercalcemia., Alprazolam 0.5MG  BID prn. TSH 2.734 01/12/20   Bradycardia Bradycardia, improved from 50s from 40s,Hx of normalized HR after Tylenol PM dc'd, GDR of Lithium  Hypercalcemia Hypercalcemia, pending endocrinology, Ca 10 01/26/20,  PTH 79 01/12/20. GDR of Lithium may help.    Edema  BLE trace edema   Arthritis, degenerative OA, ttakes Tylenol, s/p R knee arthroplasty.   Cervico-occipital neuralgia Cervico occipital neuralgia, Parkinson's, cervical degenerative changes, aches in neck R>L travels to the back of head, comes and goes, not disabling.   Essential (primary) hypertension HTN, blood pressure is controlled on Losartan, Bun/creat 19/0.9 01/26/20   GERD (gastroesophageal reflux disease) GERD, stable, on Omeprazole 20mg  bid, Hgb 11.7 01/26/20.   Parkinson disease ([place]) Hx of  Parkinson's, stable(diplopiaR+L lateral,tremor in fingers), on Sinemet f/u Neurology.    Slow transit constipation Constipation. Prn Metamucil, MiraLaxqdare effective.  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 Daily Psyllium .  Tinnitus of both ears New, started Nasonex nasal spray x 1wk, may ENT if persists.     Family/ staff Communication: plan of care reviewed with the patient and charge nurse.   Labs/tests ordered:  None  Time spend 40 minutes.

## 2020-02-02 NOTE — Assessment & Plan Note (Signed)
Constipation. Prn Metamucil, MiraLaxqdare effective.

## 2020-02-02 NOTE — Assessment & Plan Note (Signed)
New, started Nasonex nasal spray x 1wk, may ENT if persists.

## 2020-02-02 NOTE — Assessment & Plan Note (Signed)
Cervico occipital neuralgia, Parkinson's, cervical degenerative changes, aches in neck R>L travels to the back of head, comes and goes, not disabling.   

## 2020-02-02 NOTE — Assessment & Plan Note (Signed)
BLE trace edema   

## 2020-02-02 NOTE — Assessment & Plan Note (Signed)
Bipolar disorder, stable, on Lithium 150mg  bid gradually GDR in setting of bradycardia, cognitive decline, hypercalcemia., Alprazolam 0.5MG  BID prn. TSH 2.734 01/12/20

## 2020-02-02 NOTE — Assessment & Plan Note (Signed)
GERD, stable, on Omeprazole 20mg  bid, Hgb 11.7 01/26/20.

## 2020-02-03 ENCOUNTER — Encounter: Payer: Self-pay | Admitting: Nurse Practitioner

## 2020-02-03 ENCOUNTER — Other Ambulatory Visit: Payer: Self-pay | Admitting: *Deleted

## 2020-02-03 MED ORDER — LITHIUM CARBONATE 150 MG PO CAPS: 150.0000 mg | ORAL_CAPSULE | Freq: Two times a day (BID) | ORAL | 1 refills | Status: DC

## 2020-02-03 MED ORDER — CARBIDOPA-LEVODOPA 10-100 MG PO TABS: 1.0000 | ORAL_TABLET | Freq: Three times a day (TID) | ORAL | 1 refills | Status: DC

## 2020-02-03 NOTE — Telephone Encounter (Signed)
Royal Hawthorn, Nurse with [facility] called and stated that patient's medications hasn't been sent to pharmacy.  Stated that the Lithium 150 was increased to twice daily and needs the Sinemet.  Pended Rx's and sent to [facility] for approval (seen patient yesterday.)   OV Note Dated 02/02/2020: Assessment/Plan: Bipolar I disorder, single manic episode, in full remission ([place]) Bipolar disorder, stable, on Lithium 150mg  bid gradually GDR in setting of bradycardia, cognitive decline, hypercalcemia., Alprazolam 0.5MG  BID prn. TSH 2.734 01/12/20

## 2020-02-10 ENCOUNTER — Other Ambulatory Visit: Payer: Self-pay

## 2020-02-10 ENCOUNTER — Encounter: Payer: Self-pay | Admitting: Nurse Practitioner

## 2020-02-10 ENCOUNTER — Non-Acute Institutional Stay: Payer: Medicare PPO | Admitting: Nurse Practitioner

## 2020-02-10 VITALS — BP 120/76 | HR 49 | Temp 97.6°F | Ht 60.0 in | Wt 149.0 lb

## 2020-02-10 DIAGNOSIS — H9313 Tinnitus, bilateral: Secondary | ICD-10-CM | POA: Diagnosis not present

## 2020-02-10 DIAGNOSIS — M5481 Occipital neuralgia: Secondary | ICD-10-CM

## 2020-02-10 DIAGNOSIS — K219 Gastro-esophageal reflux disease without esophagitis: Secondary | ICD-10-CM

## 2020-02-10 DIAGNOSIS — C49A3 Gastrointestinal stromal tumor of small intestine: Secondary | ICD-10-CM

## 2020-02-10 DIAGNOSIS — R4189 Other symptoms and signs involving cognitive functions and awareness: Secondary | ICD-10-CM | POA: Insufficient documentation

## 2020-02-10 DIAGNOSIS — M8949 Other hypertrophic osteoarthropathy, multiple sites: Secondary | ICD-10-CM

## 2020-02-10 DIAGNOSIS — R609 Edema, unspecified: Secondary | ICD-10-CM

## 2020-02-10 DIAGNOSIS — I10 Essential (primary) hypertension: Secondary | ICD-10-CM

## 2020-02-10 DIAGNOSIS — F304 Manic episode in full remission: Secondary | ICD-10-CM

## 2020-02-10 DIAGNOSIS — K5901 Slow transit constipation: Secondary | ICD-10-CM

## 2020-02-10 DIAGNOSIS — F0390 Unspecified dementia without behavioral disturbance: Secondary | ICD-10-CM | POA: Insufficient documentation

## 2020-02-10 DIAGNOSIS — G20 Parkinson's disease: Secondary | ICD-10-CM

## 2020-02-10 DIAGNOSIS — M159 Polyosteoarthritis, unspecified: Secondary | ICD-10-CM

## 2020-02-10 DIAGNOSIS — R001 Bradycardia, unspecified: Secondary | ICD-10-CM

## 2020-02-10 NOTE — Assessment & Plan Note (Signed)
BLE trace edema

## 2020-02-10 NOTE — Assessment & Plan Note (Signed)
HTN, blood pressure is controlled on Losartan, Bun/creat 19/0.9 01/26/20  

## 2020-02-10 NOTE — Assessment & Plan Note (Signed)
Hx of GIST of small intestine s/p resection. prn MiraLax. Daily Psyllium . 

## 2020-02-10 NOTE — Assessment & Plan Note (Signed)
Hypercalcemia,pending endocrinology, Ca10 01/26/20,PTH 79 01/12/20. GDR of Lithium may help  

## 2020-02-10 NOTE — Assessment & Plan Note (Signed)
Constipation. Prn Metamucil, MiraLaxqdare effective. 

## 2020-02-10 NOTE — Assessment & Plan Note (Signed)
Bradycardia, improved from 50s from 40s,Hx ofnormalizedHRafter Tylenol PM dc'd, GDR of Lithium helped too. 

## 2020-02-10 NOTE — Progress Notes (Signed)
Location:   clinic [place]   Place of Service:  Clinic (12) Provider: Marlana Latus NP  Code Status: DNR Goals of Care: [place] 02/02/2020  Does Patient Have a Medical Advance Directive? Yes  Type of Advance Directive [place]  Does patient want to make changes to medical advance directive? No - Patient declined  Copy of [place] in Chart? Yes - validated most recent copy scanned in chart (See row information)  Would patient like information on creating a medical advance directive? -  Pre-existing out of facility DNR order (yellow form or pink MOST form) -     Chief Complaint  Patient presents with  . Medical Management of Chronic Issues    Patient returns to the clinic for her 1 week follow up. She would like to discuss her slow heart rate.    HPI: Patient is a 80 y.o. female seen today for medical management of chronic diseases.     Hx of GIST of small intestine s/p resection.prn MiraLax. Daily Psyllium . Constipation. Prn Metamucil, MiraLaxqdare effective. Hx of Parkinson's, stable(diplopiaR+L lateral,tremor in fingers much improved), onSinemet f/u Neurology. GERD, stable, on Omeprazole 20mg  bid, Hgb 11.7 01/26/20.  HTN, blood pressure is controlled on Losartan, Bun/creat19/0.9 01/26/20 Bipolar disorder, stable, on Lithium 150mg  bid GDR due to bradycardia, Alprazolam 0.5MG  BID prn. TSH 2.734 01/12/20 Bradycardia, improved from 50s from 40s,Hx of normalized HR after Tylenol PM dc'd, GDR of Lithium helped too.  Hypercalcemia,pending endocrinology, Ca 10 01/26/20,  PTH 79 01/12/20. GDR of Lithium may help BLE trace edema  OA, ttakes Tylenol, s/p R knee arthroplasty.             Cervico occipital neuralgia, Parkinson's, cervical degenerative changes, aches in neck R>L travels to the back of head, comes and goes,  not disabling.   Cognitive impairment: 12/2019 MRI brain showed chronic microvascular ischemic changes, no acute disease with mild cortical atrophy Past Medical History:  Diagnosis Date  . Anxiety   . Arthritis   . Cardiac conduction disorder 03/30/2012   Overview:  STORY: ETT 03/09/2012 Echo 03/07/2012 normal Dr Einar Gip, bradycardia felt due to glaucoma eye drops  . Diverticulosis of colon   . GIST (gastrointestinal stroma tumor), malignant, colon ([place])   . Heart murmur   . History of colon polyps 10/24/2008  . Hypertension   . Major neurocognitive disorder due to Parkinson's disease, possible    Tremors possible parkinsons  . Manic disorder, single episode, in full remission ([place]) 12/01/2009  . Sixth nerve palsy     Past Surgical History:  Procedure Laterality Date  . BILATERAL SALPINGOOPHORECTOMY  09/22/2007  . Gastrointestinal Stroma Tumor,  Other  1960   GIST Surgery  . ILEOCECETOMY  09/22/2007  . OVARIAN CYST REMOVAL Right 1967  . SMALL INTESTINE SURGERY  09/22/2007  . TOTAL KNEE ARTHROPLASTY Right 10/05/2019   Procedure: RIGHT TOTAL KNEE ARTHROPLASTY;  Surgeon: Meredith Pel, MD;  Location: [facility];  Service: Orthopedics;  Laterality: Right;  . TOTAL VAGINAL HYSTERECTOMY  1986   Fibroids    Allergies  Allergen Reactions  . Lisinopril Cough  . Penicillins Itching    50 years ago  . Adhesive [Tape] Itching  . Atorvastatin Itching  . Dilaudid [Hydromorphone Hcl] Itching  . Hydromorphone Itching    Allergies as of 02/10/2020      Reactions   Lisinopril Cough   Penicillins Itching   50 years ago   Adhesive [tape] Itching  Atorvastatin Itching   Dilaudid [hydromorphone Hcl] Itching   Hydromorphone Itching      Medication List       Accurate as of February 10, 2020  3:50 PM. If you have any questions, ask your nurse or doctor.        STOP taking these medications   Lidocaine-Menthol 4-1 % Ptch Stopped by: Other Atienza X Stella Bortle, NP     TAKE these medications    acetaminophen 325 MG tablet Commonly known as: TYLENOL Take 650 mg by mouth every 4 (four) hours as needed.   ALPRAZolam 0.5 MG tablet Commonly known as: XANAX Take 1 tablet (0.5 mg total) by mouth 2 (two) times daily as needed for anxiety.   aspirin EC 81 MG tablet Take 1 tablet (81 mg total) by mouth daily.   carbidopa-levodopa 10-100 MG tablet Commonly known as: SINEMET IR Take 1 tablet by mouth 3 (three) times daily.   dorzolamide 2 % ophthalmic solution Commonly known as: TRUSOPT Place 1 drop into both eyes 2 (two) times daily.   fexofenadine 60 MG tablet Commonly known as: ALLEGRA Take 60 mg by mouth 2 (two) times daily.   fluticasone 50 MCG/ACT nasal spray Commonly known as: FLONASE Place 1 spray into both nostrils daily.   latanoprost 0.005 % ophthalmic solution Commonly known as: XALATAN Place 1 drop into both eyes at bedtime.   lithium carbonate 150 MG capsule Take 1 capsule (150 mg total) by mouth 2 (two) times daily with a meal.   losartan 50 MG tablet Commonly known as: COZAAR Take 1 tablet (50 mg total) by mouth daily.   omeprazole 20 MG capsule Commonly known as: PRILOSEC Take 1 capsule (20 mg total) by mouth in the morning and at bedtime.   polyethylene glycol 17 g packet Commonly known as: MIRALAX / GLYCOLAX Take 17 g by mouth daily.       Review of Systems:  Review of Systems  Constitutional: Negative for appetite change, fatigue and fever.  HENT: Positive for hearing loss and tinnitus. Negative for congestion and trouble swallowing.   Eyes: Negative for visual disturbance.       Glaucoma, diplopia R+L lateral peripheral visual fields only.   Respiratory: Negative for cough and shortness of breath.        Occasionally DOE, hacking cough  Cardiovascular: Positive for leg swelling.  Gastrointestinal: Negative for abdominal pain and constipation.  Genitourinary: Negative for dysuria, frequency and urgency.  Musculoskeletal: Positive for  arthralgias and gait problem.       Walker.   Skin: Negative for color change.  Neurological: Positive for tremors and headaches. Negative for dizziness, facial asymmetry, speech difficulty and light-headedness.       Tremors in hands/fingers at rest. L>R, progressing, comes and goes nature of the neck/occipital pain. Stiff neck. Pain the patent right knee, s/p TKR. Tremor is better since Sinemet.   Psychiatric/Behavioral: Negative for behavioral problems, confusion and sleep disturbance. The patient is not nervous/anxious.     Health Maintenance  Topic Date Due  . Hepatitis C Screening  Never done  . COVID-19 Vaccine (4 - Booster for Moderna series) 05/22/2020  . TETANUS/TDAP  08/19/2021  . INFLUENZA VACCINE  Completed  . DEXA SCAN  Completed  . PNA vac Low Risk Adult  Completed    Physical Exam: Vitals:   02/10/20 1407  BP: 120/76  Pulse: (!) 49  Temp: 97.6 F (36.4 C)  SpO2: 99%  Weight: 149 lb (67.6 kg)  Height: 5' (1.524  m)   Body mass index is 29.1 kg/m. Physical Exam Vitals and nursing note reviewed.  Constitutional:      Appearance: Normal appearance.  HENT:     Head: Normocephalic and atraumatic.     Mouth/Throat:     Mouth: Mucous membranes are moist.  Eyes:     Extraocular Movements: Extraocular movements intact.     Conjunctiva/sclera: Conjunctivae normal.     Pupils: Pupils are equal, round, and reactive to light.  Cardiovascular:     Rate and Rhythm: Regular rhythm. Bradycardia present.     Heart sounds: No murmur heard.     Comments: HR in 50s Pulmonary:     Breath sounds: No rales.  Abdominal:     General: Bowel sounds are normal.     Palpations: Abdomen is soft.     Tenderness: There is no abdominal tenderness.  Musculoskeletal:        General: No tenderness.     Cervical back: Normal range of motion and neck supple.     Right lower leg: Edema present.     Left lower leg: Edema present.     Comments: Chronic R knee pain is improved. S/p TKR  right. Stiff neck, comes and goes neck/occipital pain. Trace edema BLE R>L  Skin:    General: Skin is warm and dry.  Neurological:     General: No focal deficit present.     Mental Status: She is alert and oriented to person, place, and time. Mental status is at baseline.     Motor: No weakness.     Coordination: Coordination abnormal.     Gait: Gait abnormal.     Comments: Resting tremor in fingers, improved.   Psychiatric:        Mood and Affect: Mood normal.        Behavior: Behavior normal.        Thought Content: Thought content normal.        Judgment: Judgment normal.     Labs reviewed: Basic Metabolic Panel: Recent Labs    04/05/19 0905 04/06/19 1245 10/01/19 1139 01/12/20 1610 01/12/20 2122 01/12/20 2123 01/13/20 0519 01/26/20 0000  NA 140   < > 140 141  --   --  143 139  K 4.3   < > 4.4 4.9  --   --  3.9 5.1  CL 109   < > 109 112*  --   --  115* 109*  CO2 23   < > 24 22  --   --  22 25*  GLUCOSE 127*   < > 91 105*  --   --  102*  --   BUN 16   < > 20 18  --   --  20 19  CREATININE 0.91   < > 0.84 1.13*  --   --  0.98 0.9  CALCIUM 11.4*   < > 10.9* 10.8*  --  10.5* 9.8 10.0  TSH 2.94  --   --   --  2.734  --   --   --    < > = values in this interval not displayed.   Liver Function Tests: Recent Labs    04/05/19 0905 04/05/19 0905 04/06/19 1245 05/28/19 0000 01/12/20 1610 01/26/20 0000  AST 19  --  23 22 43* 11*  ALT 13  --  18 13 23 12   ALKPHOS  --    < > 75 101 85 81  BILITOT 0.4  --  0.5  --  0.8  --   PROT 6.9  --  6.7  --  7.2  --   ALBUMIN  --    < > 4.0 4.6 4.2 3.5   < > = values in this interval not displayed.   No results for input(s): LIPASE, AMYLASE in the last 8760 hours. No results for input(s): AMMONIA in the last 8760 hours. CBC: Recent Labs    04/06/19 1245 05/28/19 0000 10/01/19 1139 01/12/20 1610 01/13/20 0519 01/26/20 0000  WBC 8.1 8.7 8.7 10.2 8.3 7.1  NEUTROABS 6.0 7,439  --   --   --  4,828.00  HGB 12.4 14.1 12.5  13.1 12.2 11.7*  HCT 39.0 41 41.1 42.2 38.9 35*  MCV 105.4*  --  102.0* 100.7* 100.5*  --   PLT 268 336 324 326 272 294   Lipid Panel: Recent Labs    04/05/19 0905  CHOL 284*  HDL 73  LDLCALC 173*  TRIG 225*  CHOLHDL 3.9   No results found for: HGBA1C  Procedures since last visit: CT Head Wo Contrast  Result Date: 01/12/2020 CLINICAL DATA:  Altered mental status.  Headache. EXAM: CT HEAD WITHOUT CONTRAST TECHNIQUE: Contiguous axial images were obtained from the base of the skull through the vertex without intravenous contrast. COMPARISON:  None. FINDINGS: Brain: Mild diffuse cortical atrophy is noted. Mild chronic ischemic white matter disease is noted. No mass effect or midline shift is noted. Ventricular size is within normal limits. There is no evidence of mass lesion, hemorrhage or acute infarction. Vascular: No hyperdense vessel or unexpected calcification. Skull: Normal. Negative for fracture or focal lesion. Sinuses/Orbits: No acute finding. Other: None. IMPRESSION: Mild diffuse cortical atrophy. Mild chronic ischemic white matter disease. No acute intracranial abnormality seen. Electronically Signed   By: Marijo Conception M.D.   On: 01/12/2020 19:44   MR BRAIN WO CONTRAST  Result Date: 01/13/2020 CLINICAL DATA:  Mental status change EXAM: MRI HEAD WITHOUT CONTRAST TECHNIQUE: Multiplanar, multiecho pulse sequences of the brain and surrounding structures were obtained without intravenous contrast. COMPARISON:  None. FINDINGS: Brain: There is no acute infarction or intracranial hemorrhage. There is no intracranial mass, mass effect, or edema. There is no hydrocephalus or extra-axial fluid collection. Prominence of the ventricles and sulci reflects generalized parenchymal volume loss. Patchy foci of T2 hyperintensity in the supratentorial white matter are nonspecific but may reflect minor chronic microvascular ischemic changes. Vascular: Major vessel flow voids at the skull base are  preserved. Skull and upper cervical spine: Normal marrow signal is preserved. Sinuses/Orbits: Left maxillary sinus retention cyst. Trace ethmoid mucosal thickening. Bilateral lens replacements. Other: Sella is unremarkable. Patchy right greater than left mastoid fluid opacification. IMPRESSION: No evidence of recent infarction, hemorrhage, or mass. Minor chronic microvascular ischemic changes. Electronically Signed   By: Macy Mis M.D.   On: 01/13/2020 11:49   VAS [place] LOWER EXTREMITY VENOUS (DVT)  Result Date: 01/13/2020  Lower Venous DVT Study Indications: Asymmetrical swelling of RT lower extremity.  Risk Factors: Surgery 10-05-2019 RT total knee arthroplasty. Comparison Study: No prior studies. Performing Technologist: Darlin Coco, RDMS  Examination Guidelines: A complete evaluation includes B-mode imaging, spectral Doppler, color Doppler, and power Doppler as needed of all accessible portions of each vessel. Bilateral testing is considered an integral part of a complete examination. Limited examinations for reoccurring indications may be performed as noted. The reflux portion of the exam is performed with the patient in reverse Trendelenburg.  +---------+---------------+---------+-----------+----------+--------------+ RIGHT    CompressibilityPhasicitySpontaneityPropertiesThrombus Aging +---------+---------------+---------+-----------+----------+--------------+  CFV      Full           Yes      Yes                                 +---------+---------------+---------+-----------+----------+--------------+ SFJ      Full                                                        +---------+---------------+---------+-----------+----------+--------------+ FV Prox  Full                                                        +---------+---------------+---------+-----------+----------+--------------+ FV Mid   Full                                                         +---------+---------------+---------+-----------+----------+--------------+ FV DistalFull                                                        +---------+---------------+---------+-----------+----------+--------------+ PFV      Full                                                        +---------+---------------+---------+-----------+----------+--------------+ POP      Full           Yes      Yes                                 +---------+---------------+---------+-----------+----------+--------------+ PTV      Full                                                        +---------+---------------+---------+-----------+----------+--------------+ PERO     Full                                                        +---------+---------------+---------+-----------+----------+--------------+   +----+---------------+---------+-----------+----------+--------------+ LEFTCompressibilityPhasicitySpontaneityPropertiesThrombus Aging +----+---------------+---------+-----------+----------+--------------+ CFV Full           Yes      Yes                                 +----+---------------+---------+-----------+----------+--------------+  Summary: RIGHT: - There is no evidence of deep vein thrombosis in the lower extremity.  - No cystic structure found in the popliteal fossa.  LEFT: - No evidence of common femoral vein obstruction.  *See table(s) above for measurements and observations. Electronically signed by Servando Snare MD on 01/13/2020 at 8:20:37 PM.    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 Daily Psyllium .  Slow transit constipation Constipation. Prn Metamucil, MiraLaxqdare effective.   Parkinsonism ([place]) Hx of Parkinson's, stable(diplopiaR+L lateral,tremor in fingers), onSinemet f/u Neurology.   GERD  (gastroesophageal reflux disease) GERD, stable, on Omeprazole 20mg  bid, Hgb 11.7 01/26/20.    Essential (primary) hypertension HTN, blood pressure is controlled on Losartan, Bun/creat19/0.9 01/26/20   Bipolar I disorder, single manic episode, in full remission ([place]) Bipolar disorder, stable, will decrease Lithium 150mg  qd at hs,  GDR due to bradycardia, Alprazolam 0.5MG  BID prn. TSH 2.734 01/12/20   Bradycardia Bradycardia, improved from 50s from 40s,Hx of normalized HR after Tylenol PM dc'd, GDR of Lithium helped too.    Hypercalcemia Hypercalcemia,pending endocrinology, Ca 10 01/26/20,  PTH 79 01/12/20. GDR of Lithium may help   Edema BLE trace edema    Arthritis, degenerative OA, ttakes Tylenol, s/p R knee arthroplasty.  Cervico-occipital neuralgia Cervico occipital neuralgia, Parkinson's, cervical degenerative changes, aches in neck R>L travels to the back of head, comes and goes, not disabling.    Cognitive decline Cognitive impairment: 12/2019 MRI brain showed chronic microvascular ischemic changes, no acute disease with mild cortical atrophy   Tinnitus of both ears No change regardless Nasonex spray. Referral made to ENT   Labs/tests ordered:  None  Next appt:  3 months in clinic [facility]

## 2020-02-10 NOTE — Assessment & Plan Note (Signed)
OA, ttakes Tylenol, s/p R knee arthroplasty.

## 2020-02-10 NOTE — Assessment & Plan Note (Signed)
Cervico occipital neuralgia, Parkinson's, cervical degenerative changes, aches in neck R>L travels to the back of head, comes and goes, not disabling.

## 2020-02-10 NOTE — Assessment & Plan Note (Signed)
GERD, stable, on Omeprazole 20mg bid, Hgb 11.7 01/26/20.  

## 2020-02-10 NOTE — Assessment & Plan Note (Addendum)
No change regardless Nasonex spray. Referral made to ENT

## 2020-02-10 NOTE — Assessment & Plan Note (Addendum)
Bipolar disorder, stable, will decrease Lithium 150mg  qd at hs,  GDR due to bradycardia, Alprazolam 0.5MG  BID prn. TSH 2.734 01/12/20

## 2020-02-10 NOTE — Assessment & Plan Note (Signed)
Cognitive impairment: 12/2019 MRI brain showed chronic microvascular ischemic changes, no acute disease with mild cortical atrophy 

## 2020-02-10 NOTE — Assessment & Plan Note (Signed)
Hx of Parkinson's, stable(diplopiaR+L lateral,tremor in fingers), on Sinemet f/u Neurology.   

## 2020-02-14 ENCOUNTER — Other Ambulatory Visit: Payer: Self-pay | Admitting: Nurse Practitioner

## 2020-02-14 NOTE — Telephone Encounter (Signed)
Refill request received from [facility] for Alprazolam 0.5 mg tablet one twice a day as needed for anxiety. Medication pended and sent to Dr. Lyndel Safe for approval.

## 2020-02-17 ENCOUNTER — Other Ambulatory Visit: Payer: Self-pay | Admitting: *Deleted

## 2020-02-17 MED ORDER — CARBIDOPA-LEVODOPA 10-100 MG PO TABS: 1.0000 | ORAL_TABLET | Freq: Three times a day (TID) | ORAL | 1 refills | Status: DC

## 2020-02-17 NOTE — Telephone Encounter (Signed)
Medication would not escribe over. Spoke with Pharmacist, Jerene Pitch and called in.

## 2020-02-24 ENCOUNTER — Encounter: Payer: Self-pay | Admitting: Nurse Practitioner

## 2020-02-24 ENCOUNTER — Non-Acute Institutional Stay: Payer: Medicare PPO | Admitting: Nurse Practitioner

## 2020-02-24 ENCOUNTER — Other Ambulatory Visit: Payer: Self-pay

## 2020-02-24 DIAGNOSIS — R609 Edema, unspecified: Secondary | ICD-10-CM

## 2020-02-24 DIAGNOSIS — C49A3 Gastrointestinal stromal tumor of small intestine: Secondary | ICD-10-CM | POA: Diagnosis not present

## 2020-02-24 DIAGNOSIS — G20 Parkinson's disease: Secondary | ICD-10-CM

## 2020-02-24 DIAGNOSIS — R001 Bradycardia, unspecified: Secondary | ICD-10-CM

## 2020-02-24 DIAGNOSIS — K5901 Slow transit constipation: Secondary | ICD-10-CM | POA: Diagnosis not present

## 2020-02-24 DIAGNOSIS — M5481 Occipital neuralgia: Secondary | ICD-10-CM

## 2020-02-24 DIAGNOSIS — K219 Gastro-esophageal reflux disease without esophagitis: Secondary | ICD-10-CM

## 2020-02-24 DIAGNOSIS — H9313 Tinnitus, bilateral: Secondary | ICD-10-CM

## 2020-02-24 DIAGNOSIS — R4189 Other symptoms and signs involving cognitive functions and awareness: Secondary | ICD-10-CM

## 2020-02-24 DIAGNOSIS — I10 Essential (primary) hypertension: Secondary | ICD-10-CM

## 2020-02-24 DIAGNOSIS — F304 Manic episode in full remission: Secondary | ICD-10-CM

## 2020-02-24 DIAGNOSIS — G8929 Other chronic pain: Secondary | ICD-10-CM

## 2020-02-24 DIAGNOSIS — M25561 Pain in right knee: Secondary | ICD-10-CM

## 2020-02-24 NOTE — Assessment & Plan Note (Signed)
blood pressure is controlled on Losartan, Bun/creat19/0.9 01/26/20  

## 2020-02-24 NOTE — Assessment & Plan Note (Signed)
[place] [facility], 12/2019 MRI brain showed chronic microvascular ischemic changes, no acute disease with mild cortical atrophy

## 2020-02-24 NOTE — Assessment & Plan Note (Signed)
s/p resection.prn MiraLax. Daily Psyllium .

## 2020-02-24 NOTE — Assessment & Plan Note (Signed)
stable(diplopia R+L lateral,  tremor in fingers much improved), on Sinemet f/u Neurology.  °

## 2020-02-24 NOTE — Assessment & Plan Note (Signed)
Prn Metamucil, MiraLax qd are effective.  

## 2020-02-24 NOTE — Assessment & Plan Note (Addendum)
Resume 300mg  qhs for better mood control,  GDR due to bradycardia-HR 62bpm today-relapsing mood,  Alprazolam 0.5MG  BID prn. TSH 2.734 01/12/20

## 2020-02-24 NOTE — Assessment & Plan Note (Signed)
aches in neck R>L travels to the back of head, comes and goes, not disabling.

## 2020-02-24 NOTE — Assessment & Plan Note (Signed)
pending ENT

## 2020-02-24 NOTE — Assessment & Plan Note (Signed)
pending endocrinology, Ca10 01/26/20,PTH 79 01/12/20. GDR of Lithium may help

## 2020-02-24 NOTE — Assessment & Plan Note (Signed)
Trace edema BLE, no change.

## 2020-02-24 NOTE — Assessment & Plan Note (Signed)
Tylenol, s/p R knee arthroplasty.

## 2020-02-24 NOTE — Assessment & Plan Note (Signed)
stable, on Omeprazole 20mg bid, Hgb 11.7 01/26/20.  

## 2020-02-24 NOTE — Progress Notes (Addendum)
Location:   clinic [place]   Place of Service:  Clinic (12) Provider: Marlana Latus NP  Code Status: DNR Goals of Care: [place] 02/02/2020  Does Patient Have a Medical Advance Directive? Yes  Type of Advance Directive [place]  Does patient want to make changes to medical advance directive? No - Patient declined  Copy of [place] in Chart? Yes - validated most recent copy scanned in chart (See row information)  Would patient like information on creating a medical advance directive? -  Pre-existing out of facility DNR order (yellow form or pink MOST form) -     Chief Complaint  Patient presents with  . Medical Management of Chronic Issues    Patient returns to the clinic to discuss her lithium regimen.     HPI: Patient is a 80 y.o. female seen today for medical management of chronic diseases.    Tinnitus pending ENT   Hx of GIST of small intestine s/p resection.prn MiraLax. Daily Psyllium . Constipation. Prn Metamucil, MiraLaxqdare effective. Hx of Parkinson's, stable(diplopiaR+L lateral,tremor in fingers much improved), onSinemet f/u Neurology. GERD, stable, on Omeprazole 20mg  bid, Hgb 11.7 01/26/20. HTN, blood pressure is controlled on Losartan, Bun/creat19/0.9 01/26/20 Bipolar disorder, stable, on Lithium, GDR due to bradycardia, Alprazolam 0.5MG  BID prn. TSH 2.734 01/12/20 Bradycardia, improved from 50s from 40s,Hx ofnormalizedHRafter Tylenol PM dc'd, GDR of Lithium helped too.  Hypercalcemia,pending endocrinology, Ca10 01/26/20,PTH 79 01/12/20. GDR of Lithium may help BLE trace edema  OA, ttakes Tylenol, s/p R knee arthroplasty. Cervico occipital neuralgia, Parkinson's, cervical degenerative changes, aches in neck R>L travels to the back of head, comes and goes, not disabling.             Cognitive impairment: 12/2019 MRI brain showed chronic microvascular ischemic changes, no acute disease with mild cortical atrophy  Past Medical History:  Diagnosis Date  . Anxiety   . Arthritis   . Cardiac conduction disorder 03/30/2012   Overview:  STORY: ETT 03/09/2012 Echo 03/07/2012 normal Dr Einar Gip, bradycardia felt due to glaucoma eye drops  . Diverticulosis of colon   . GIST (gastrointestinal stroma tumor), malignant, colon ([place])   . Heart murmur   . History of colon polyps 10/24/2008  . Hypertension   . Major neurocognitive disorder due to Parkinson's disease, possible    Tremors possible parkinsons  . Manic disorder, single episode, in full remission ([place]) 12/01/2009  . Sixth nerve palsy     Past Surgical History:  Procedure Laterality Date  . BILATERAL SALPINGOOPHORECTOMY  09/22/2007  . Gastrointestinal Stroma Tumor,  Other  1960   GIST Surgery  . ILEOCECETOMY  09/22/2007  . OVARIAN CYST REMOVAL Right 1967  . SMALL INTESTINE SURGERY  09/22/2007  . TOTAL KNEE ARTHROPLASTY Right 10/05/2019   Procedure: RIGHT TOTAL KNEE ARTHROPLASTY;  Surgeon: Meredith Pel, MD;  Location: [facility];  Service: Orthopedics;  Laterality: Right;  . TOTAL VAGINAL HYSTERECTOMY  1986   Fibroids    Allergies  Allergen Reactions  . Lisinopril Cough  . Penicillins Itching    50 years ago  . Adhesive [Tape] Itching  . Atorvastatin Itching  . Dilaudid [Hydromorphone Hcl] Itching  . Hydromorphone Itching    Allergies as of 02/24/2020      Reactions   Lisinopril Cough   Penicillins Itching   50 years ago   Adhesive [tape] Itching   Atorvastatin Itching   Dilaudid [hydromorphone Hcl] Itching   Hydromorphone Itching  Medication List       Accurate as of February 24, 2020 11:59 PM. If you have any questions, ask your nurse or doctor.        acetaminophen 325 MG tablet Commonly known as: TYLENOL Take 650 mg by mouth every 4 (four) hours as needed.   ALPRAZolam 0.5 MG  tablet Commonly known as: XANAX TAKE (1) TABLET TWICE DAILY AS NEEDED FOR ANXIETY.   carbidopa-levodopa 10-100 MG tablet Commonly known as: SINEMET IR Take 1 tablet by mouth 3 (three) times daily.   dorzolamide 2 % ophthalmic solution Commonly known as: TRUSOPT Place 1 drop into both eyes 2 (two) times daily.   fexofenadine 60 MG tablet Commonly known as: ALLEGRA Take 60 mg by mouth 2 (two) times daily.   fluticasone 50 MCG/ACT nasal spray Commonly known as: FLONASE Place 1 spray into both nostrils daily.   latanoprost 0.005 % ophthalmic solution Commonly known as: XALATAN Place 1 drop into both eyes at bedtime.   lithium carbonate 150 MG capsule Take 1 capsule (150 mg total) by mouth 2 (two) times daily with a meal.   losartan 50 MG tablet Commonly known as: COZAAR Take 1 tablet (50 mg total) by mouth daily.   omeprazole 20 MG capsule Commonly known as: PRILOSEC Take 1 capsule (20 mg total) by mouth in the morning and at bedtime.   polyethylene glycol 17 g packet Commonly known as: MIRALAX / GLYCOLAX Take 17 g by mouth daily.       Review of Systems:  Review of Systems  Constitutional: Negative for appetite change, fatigue and fever.  HENT: Positive for hearing loss and tinnitus. Negative for congestion and trouble swallowing.   Eyes: Negative for visual disturbance.       Glaucoma, diplopia R+L lateral peripheral visual fields only.   Respiratory: Negative for cough and shortness of breath.        Occasionally DOE, hacking cough  Cardiovascular: Positive for leg swelling.  Gastrointestinal: Negative for abdominal pain and constipation.  Genitourinary: Negative for dysuria, frequency and urgency.  Musculoskeletal: Positive for arthralgias and gait problem.       Walker.   Skin: Negative for color change.  Neurological: Positive for tremors and headaches. Negative for dizziness, facial asymmetry, speech difficulty and light-headedness.       Tremors in  hands/fingers at rest. L>R, progressing, comes and goes nature of the neck/occipital pain. Stiff neck. Pain the patent right knee, s/p TKR. Tremor is better since Sinemet.   Psychiatric/Behavioral: Positive for sleep disturbance. Negative for confusion. The patient is nervous/anxious.        Relapsed mood, feels anxious or depressive mood.     Health Maintenance  Topic Date Due  . Hepatitis C Screening  Never done  . COVID-19 Vaccine (4 - Booster for Moderna series) 05/22/2020  . TETANUS/TDAP  08/19/2021  . INFLUENZA VACCINE  Completed  . DEXA SCAN  Completed  . PNA vac Low Risk Adult  Completed    Physical Exam: Vitals:   02/24/20 1334  BP: 126/84  Pulse: 66  Temp: (!) 97 F (36.1 C)  SpO2: 98%  Weight: 150 lb (68 kg)  Height: 5' (1.524 m)   Body mass index is 29.29 kg/m. Physical Exam Vitals and nursing note reviewed.  Constitutional:      Appearance: Normal appearance.  HENT:     Head: Normocephalic and atraumatic.     Mouth/Throat:     Mouth: Mucous membranes are moist.  Eyes:  Extraocular Movements: Extraocular movements intact.     Conjunctiva/sclera: Conjunctivae normal.     Pupils: Pupils are equal, round, and reactive to light.  Cardiovascular:     Rate and Rhythm: Normal rate and regular rhythm.     Heart sounds: Murmur heard.      Comments: HR in 62 bpm Pulmonary:     Breath sounds: No rales.  Abdominal:     General: Bowel sounds are normal.     Palpations: Abdomen is soft.     Tenderness: There is no abdominal tenderness.  Musculoskeletal:        General: No tenderness.     Cervical back: Normal range of motion and neck supple.     Right lower leg: No edema.     Left lower leg: Edema present.     Comments: Chronic R knee pain is improved. S/p TKR right. Stiff neck, comes and goes neck/occipital pain. Trace edema RLE  Skin:    General: Skin is warm and dry.  Neurological:     General: No focal deficit present.     Mental Status: She is alert  and oriented to person, place, and time. Mental status is at baseline.     Motor: No weakness.     Coordination: Coordination abnormal.     Gait: Gait abnormal.     Comments: Resting tremor in fingers, improved.   Psychiatric:        Mood and Affect: Mood normal.        Behavior: Behavior normal.        Thought Content: Thought content normal.        Judgment: Judgment normal.     Labs reviewed: Basic Metabolic Panel: Recent Labs    04/05/19 0905 04/06/19 1245 10/01/19 1139 01/12/20 1610 01/12/20 2122 01/12/20 2123 01/13/20 0519 01/26/20 0000  NA 140   < > 140 141  --   --  143 139  K 4.3   < > 4.4 4.9  --   --  3.9 5.1  CL 109   < > 109 112*  --   --  115* 109*  CO2 23   < > 24 22  --   --  22 25*  GLUCOSE 127*   < > 91 105*  --   --  102*  --   BUN 16   < > 20 18  --   --  20 19  CREATININE 0.91   < > 0.84 1.13*  --   --  0.98 0.9  CALCIUM 11.4*   < > 10.9* 10.8*  --  10.5* 9.8 10.0  TSH 2.94  --   --   --  2.734  --   --   --    < > = values in this interval not displayed.   Liver Function Tests: Recent Labs    04/05/19 0905 04/05/19 0905 04/06/19 1245 05/28/19 0000 01/12/20 1610 01/26/20 0000  AST 19  --  23 22 43* 11*  ALT 13  --  18 13 23 12   ALKPHOS  --    < > 75 101 85 81  BILITOT 0.4  --  0.5  --  0.8  --   PROT 6.9  --  6.7  --  7.2  --   ALBUMIN  --    < > 4.0 4.6 4.2 3.5   < > = values in this interval not displayed.   No results for input(s): LIPASE, AMYLASE in the  last 8760 hours. No results for input(s): AMMONIA in the last 8760 hours. CBC: Recent Labs    04/06/19 1245 05/28/19 0000 10/01/19 1139 01/12/20 1610 01/13/20 0519 01/26/20 0000  WBC 8.1 8.7 8.7 10.2 8.3 7.1  NEUTROABS 6.0 7,439  --   --   --  4,828.00  HGB 12.4 14.1 12.5 13.1 12.2 11.7*  HCT 39.0 41 41.1 42.2 38.9 35*  MCV 105.4*  --  102.0* 100.7* 100.5*  --   PLT 268 336 324 326 272 294   Lipid Panel: Recent Labs    04/05/19 0905  CHOL 284*  HDL 73  LDLCALC 173*   TRIG 225*  CHOLHDL 3.9   No results found for: HGBA1C  Procedures since last visit: No results found.  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 Daily Psyllium .  Slow transit constipation Prn Metamucil, MiraLaxqdare effective.   Parkinsonism ([place]) stable(diplopiaR+L lateral,tremor in fingers much improved), onSinemet f/u Neurology.   GERD (gastroesophageal reflux disease) stable, on Omeprazole 20mg  bid, Hgb 11.7 01/26/20.  Essential (primary) hypertension  blood pressure is controlled on Losartan, Bun/creat19/0.9 01/26/20   Bipolar I disorder, single manic episode, in full remission ([place]) Resume 300mg  qhs for better mood control,  GDR due to bradycardia-HR 62bpm today-relapsing mood,  Alprazolam 0.5MG  BID prn. TSH 2.734 01/12/20  Bradycardia Resolved after GDR of Lithium.    Hypercalcemia pending endocrinology, Ca10 01/26/20,PTH 79 01/12/20. GDR of Lithium may help   Edema Trace edema BLE, no change.   Right knee pain Tylenol, s/p R knee arthroplasty.   Cervico-occipital neuralgia aches in neck R>L travels to the back of head, comes and goes, not disabling.   Cognitive decline [place] [facility], 12/2019 MRI brain showed chronic microvascular ischemic changes, no acute disease with mild cortical atrophy    Labs/tests ordered:  * No order type specified * Next appt:  05/19/2020

## 2020-02-24 NOTE — Assessment & Plan Note (Addendum)
Resolved after GDR of Lithium.

## 2020-02-25 ENCOUNTER — Encounter: Payer: Self-pay | Admitting: Nurse Practitioner

## 2020-03-07 ENCOUNTER — Other Ambulatory Visit: Payer: Self-pay

## 2020-03-07 ENCOUNTER — Encounter (INDEPENDENT_AMBULATORY_CARE_PROVIDER_SITE_OTHER): Payer: Self-pay | Admitting: Otolaryngology

## 2020-03-07 ENCOUNTER — Ambulatory Visit (INDEPENDENT_AMBULATORY_CARE_PROVIDER_SITE_OTHER): Payer: Medicare PPO | Admitting: Otolaryngology

## 2020-03-07 VITALS — Temp 97.3°F

## 2020-03-07 DIAGNOSIS — H6123 Impacted cerumen, bilateral: Secondary | ICD-10-CM

## 2020-03-07 DIAGNOSIS — H9313 Tinnitus, bilateral: Secondary | ICD-10-CM

## 2020-03-07 NOTE — Progress Notes (Signed)
HPI: Tracey [name] is a 80 y.o. female who presents is referred by her PCP for evaluation of ringing in both ears that she has had for more than a year.  She has not noted hearing problems.  She has used Q-tips to clean her ears..  Past Medical History:  Diagnosis Date  . Anxiety   . Arthritis   . Cardiac conduction disorder 03/30/2012   Overview:  STORY: ETT 03/09/2012 Echo 03/07/2012 normal Dr Einar Gip, bradycardia felt due to glaucoma eye drops  . Diverticulosis of colon   . GIST (gastrointestinal stroma tumor), malignant, colon ([place])   . Heart murmur   . History of colon polyps 10/24/2008  . Hypertension   . Major neurocognitive disorder due to Parkinson's disease, possible    Tremors possible parkinsons  . Manic disorder, single episode, in full remission ([place]) 12/01/2009  . Sixth nerve palsy    Past Surgical History:  Procedure Laterality Date  . BILATERAL SALPINGOOPHORECTOMY  09/22/2007  . Gastrointestinal Stroma Tumor,  Other  1960   GIST Surgery  . ILEOCECETOMY  09/22/2007  . OVARIAN CYST REMOVAL Right 1967  . SMALL INTESTINE SURGERY  09/22/2007  . TOTAL KNEE ARTHROPLASTY Right 10/05/2019   Procedure: RIGHT TOTAL KNEE ARTHROPLASTY;  Surgeon: Meredith Pel, MD;  Location: [facility];  Service: Orthopedics;  Laterality: Right;  . TOTAL VAGINAL HYSTERECTOMY  1986   Fibroids   Social History   Socioeconomic History  . Marital status: Widowed    Spouse name: Not on file  . Number of children: 2  . Years of education: Bachelors  . Highest education level: Not on file  Occupational History  . Not on file  Tobacco Use  . Smoking status: Never Smoker  . Smokeless tobacco: Never Used  Vaping Use  . Vaping Use: Never used  Substance and Sexual Activity  . Alcohol use: No  . Drug use: No  . Sexual activity: Not Currently    Birth control/protection: Post-menopausal  Other Topics Concern  . Not on file  Social History Narrative   Lives at home alone.   Right-handed.   No  caffeine use.      Diet: Eats anything, like fruits, vegetables, regular diet      Do you drink/ eat things with caffeine? No      Marital status:  Widowed                             What year were you married ? 1965      Do you live in a house, apartment,assistred living, condo, trailer, etc.)? Apartment      Is it one or more stories?  One Story      How many persons live in your home ? 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  No                            Type & how often       ADVANCED DIRECTIVES (Please bring copies)      Do you have a living will? Yes      Do you have a DNR form? Yes  If not, do you want to discuss one?       Do you have signed POA?HPOA forms?  Yes               If so, please bring to your appointment      FUNCTIONAL STATUS- To be completed by Spouse / child / Staff       Do you have difficulty bathing or dressing yourself ? No   Do you have difficulty preparing food or eating ? No      Do you have difficulty managing your mediation ? No      Do you have difficulty managing your finances ? No      Do you have difficulty affording your medication ? No      Social Determinants of Radio broadcast assistant Strain: Not on file  Food Insecurity: Not on file  Transportation Needs: Not on file  Physical Activity: Not on file  Stress: Not on file  Social Connections: Not on file   Family History  Problem Relation Age of Onset  . Congestive Heart Failure Mother   . Dementia Mother   . Heart disease Father   . Diabetes Father   . Lung cancer Son    Allergies  Allergen Reactions  . Lisinopril Cough  . Penicillins Itching    50 years ago  . Adhesive [Tape] Itching  . Atorvastatin Itching  . Dilaudid [Hydromorphone Hcl] Itching  . Hydromorphone Itching   Prior  to Admission medications   Medication Sig Start Date End Date Taking? Authorizing Provider  acetaminophen (TYLENOL) 325 MG tablet Take 650 mg by mouth every 4 (four) hours as needed.    [provider]  ALPRAZolam Duanne Moron) 0.5 MG tablet TAKE (1) TABLET TWICE DAILY AS NEEDED FOR ANXIETY. 02/14/20   [name], MD  carbidopa-levodopa (SINEMET IR) 10-100 MG tablet Take 1 tablet by mouth 3 (three) times daily. 02/17/20   [name], MD  dorzolamide (TRUSOPT) 2 % ophthalmic solution Place 1 drop into both eyes 2 (two) times daily. 01/18/20 04/26/20  Aline August, MD  fexofenadine (ALLEGRA) 60 MG tablet Take 60 mg by mouth 2 (two) times daily.    [provider]  fluticasone (FLONASE) 50 MCG/ACT nasal spray Place 1 spray into both nostrils daily.    [provider]  latanoprost (XALATAN) 0.005 % ophthalmic solution Place 1 drop into both eyes at bedtime. 01/18/20 04/26/20  Aline August, MD  lithium carbonate 150 MG capsule Take 1 capsule (150 mg total) by mouth 2 (two) times daily with a meal. 02/03/20   Mast, Man X, NP  losartan (COZAAR) 50 MG tablet Take 1 tablet (50 mg total) by mouth daily. 01/18/20 04/25/20  Aline August, MD  omeprazole (PRILOSEC) 20 MG capsule Take 1 capsule (20 mg total) by mouth in the morning and at bedtime. 01/18/20 04/26/20  Aline August, MD  polyethylene glycol (MIRALAX / GLYCOLAX) 17 g packet Take 17 g by mouth daily. 01/18/20 04/25/20  Aline August, MD     Positive ROS: Otherwise negative  All other systems have been reviewed and were otherwise negative with the exception of those mentioned in the HPI and as above.  Physical Exam: Constitutional: Alert, well-appearing, no acute distress Ears: External ears without lesions or tenderness.  She had wax in both ear canals that was just causing partial obstruction that was cleaned with suction and curettes.  TMs were clear bilaterally otherwise. Nasal: External nose without lesions.. Clear  nasal  passages Oral: Lips and gums without lesions. Tongue and palate mucosa without lesions. Posterior oropharynx clear. Neck: No palpable adenopathy or masses Respiratory: Breathing comfortably  Skin: No facial/neck lesions or rash noted.  Cerumen impaction removal  Date/Time: 03/07/2020 2:37 PM Performed by: Rozetta Nunnery, MD Authorized by: Rozetta Nunnery, MD   Consent:    Consent obtained:  Verbal   Consent given by:  Patient   Risks discussed:  Pain and bleeding Procedure details:    Location:  L ear and R ear   Procedure type: curette and suction   Post-procedure details:    Inspection:  TM intact and canal normal   Hearing quality:  Improved   Patient tolerance of procedure:  Tolerated well, no immediate complications Comments:     TMs are clear bilaterally  Audiogram demonstrated essentially normal hearing in the lower and mid frequencies with a very mild upper frequency sensorineural hearing loss in both ears.  SRT's were 20 dB bilaterally.  She had type A tympanograms bilaterally.  Assessment: Tinnitus secondary to high-frequency sensorineural hearing loss in both ears otherwise normal hearing.  Plan: Reviewed with her concerning the limited treatment options for tinnitus and discussed with her concerning using masking noise to help with the tinnitus when it is bad. Also gave her some samples of Lipo flavonoid to try that is beneficial in some people with tinnitus.   Radene Journey, MD   CC:

## 2020-03-09 ENCOUNTER — Telehealth: Payer: Self-pay | Admitting: Radiology

## 2020-03-09 MED ORDER — CLINDAMYCIN HCL 150 MG PO CAPS: ORAL_CAPSULE | ORAL | 0 refills | Status: DC

## 2020-03-09 NOTE — Telephone Encounter (Signed)
Dr. Areta Haber office called, they are a Dental office and they would like our office to send in her antibiotics for her dental procedure.

## 2020-03-09 NOTE — Addendum Note (Signed)
Addended byLaurann Montana on: 03/09/2020 03:43 PM   Modules accepted: Orders

## 2020-03-09 NOTE — Telephone Encounter (Signed)
Patient notified.     done

## 2020-03-14 DIAGNOSIS — R29898 Other symptoms and signs involving the musculoskeletal system: Secondary | ICD-10-CM | POA: Diagnosis not present

## 2020-03-14 DIAGNOSIS — M542 Cervicalgia: Secondary | ICD-10-CM | POA: Diagnosis not present

## 2020-03-17 ENCOUNTER — Encounter (INDEPENDENT_AMBULATORY_CARE_PROVIDER_SITE_OTHER): Payer: Self-pay

## 2020-03-17 DIAGNOSIS — R29898 Other symptoms and signs involving the musculoskeletal system: Secondary | ICD-10-CM | POA: Diagnosis not present

## 2020-03-17 DIAGNOSIS — M542 Cervicalgia: Secondary | ICD-10-CM | POA: Diagnosis not present

## 2020-03-21 DIAGNOSIS — M542 Cervicalgia: Secondary | ICD-10-CM | POA: Diagnosis not present

## 2020-03-21 DIAGNOSIS — L988 Other specified disorders of the skin and subcutaneous tissue: Secondary | ICD-10-CM | POA: Diagnosis not present

## 2020-03-21 DIAGNOSIS — L814 Other melanin hyperpigmentation: Secondary | ICD-10-CM | POA: Diagnosis not present

## 2020-03-21 DIAGNOSIS — D229 Melanocytic nevi, unspecified: Secondary | ICD-10-CM | POA: Diagnosis not present

## 2020-03-21 DIAGNOSIS — R29898 Other symptoms and signs involving the musculoskeletal system: Secondary | ICD-10-CM | POA: Diagnosis not present

## 2020-03-24 DIAGNOSIS — M542 Cervicalgia: Secondary | ICD-10-CM | POA: Diagnosis not present

## 2020-03-24 DIAGNOSIS — R29898 Other symptoms and signs involving the musculoskeletal system: Secondary | ICD-10-CM | POA: Diagnosis not present

## 2020-03-27 DIAGNOSIS — R29898 Other symptoms and signs involving the musculoskeletal system: Secondary | ICD-10-CM | POA: Diagnosis not present

## 2020-03-27 DIAGNOSIS — M542 Cervicalgia: Secondary | ICD-10-CM | POA: Diagnosis not present

## 2020-03-29 ENCOUNTER — Ambulatory Visit: Payer: Medicare PPO | Admitting: Neurology

## 2020-03-29 ENCOUNTER — Encounter: Payer: Self-pay | Admitting: Neurology

## 2020-03-29 VITALS — BP 122/74 | HR 53 | Ht 62.0 in | Wt 151.3 lb

## 2020-03-29 DIAGNOSIS — G20 Parkinson's disease: Secondary | ICD-10-CM

## 2020-03-29 NOTE — Patient Instructions (Signed)
It was nice to meet you both today.  As discussed, you can maintain your Parkinson's medication at the current dose and timings.  We will monitor your symptoms and examination.  Please follow-up routinely in this clinic to see one of our nurse practitioners in 3 to 4 months, sooner if needed.

## 2020-03-29 NOTE — Progress Notes (Signed)
Subjective:    Patient ID: Tracey [name] is a 80 y.o. female.  HPI     Tracey Age, MD, PhD [facility] [address] [place], [place] [address]  I saw patient, Tracey [name], as a referral from the Hospital for evaluation of her tremor disorder, concern for parkinsonism.  The patient is accompanied by her son today.  [name] is a 81 year old right-handed woman with an underlying medical history of hypertension, anxiety, arthritis, status post right total knee replacement, history of diplopia, tremor, bradycardia, mood disorder, status post knee replacement surgery, recent hospitalization for confusion, functional decline and headache, who reports history of tremors and Parkinson's-like symptoms for the past 4 to 5 years.  She had seen Dr. Krista [name] in 2017 for diplopia and there was concern for parkinsonism at the time.  She subsequently started seeing Dr. Laurena [name] in neurology.  She was treated for suspected essential tremor and was on Mysoline but no longer is on Mysoline.  She has been on lithium for years for mood disorder, possible bipolar diagnosis.  She has not seen a psychiatrist in years.  Recently, her lithium dose was reduced secondary to concern for bradycardia.  She believes that she did better on the higher dose of lithium and is wondering if it needs to be increased again.    She is no longer on ropinirole.  I reviewed recent hospital records.  She was admitted on 01/12/2020 and discharged on 01/18/2020.  Upon admission she was on ropinirole per PCP.  She was discharged on Sinemet.  She had a brain MRI without contrast on 01/12/2020 and I reviewed the results:  IMPRESSION: No evidence of recent infarction, hemorrhage, or mass. Minor chronic microvascular ischemic changes.  She had a head CT without contrast on 01/12/2020 and I reviewed the results:  IMPRESSION: Mild diffuse cortical atrophy. Mild chronic ischemic white  matter disease. No acute intracranial abnormality seen.  She has been on Sinemet generic 10-100 mg strength 1 pill 3 times daily.  She currently takes it at 8 AM, 2 PM and 8 PM.  She moved to a friend's home some 3 years ago.  She is in independent living.  She has no family history of tremor, she has no family history of Parkinson's disease.  She uses a walker.  She is in physical and occupational therapy at friend's home.  There was recent history of difficulty complying with medications.  Her son reports that she has done much better in that regard.  She has had issues with constipation.  She reports that she had bowel obstruction before.   Her Past Medical History Is Significant For: Past Medical History:  Diagnosis Date  . Anxiety   . Arthritis   . Cardiac conduction disorder 03/30/2012   Overview:  STORY: ETT 03/09/2012 Echo 03/07/2012 normal Dr Tracey [name], bradycardia felt due to glaucoma eye drops  . Diverticulosis of colon   . GIST (gastrointestinal stroma tumor), malignant, colon ([place])   . Heart murmur   . History of colon polyps 10/24/2008  . Hypertension   . Major neurocognitive disorder due to Parkinson's disease, possible    Tremors possible parkinsons  . Manic disorder, single episode, in full remission ([place]) 12/01/2009  . Sixth nerve palsy     Her Past Surgical History Is Significant For: Past Surgical History:  Procedure Laterality Date  . BILATERAL SALPINGOOPHORECTOMY  09/22/2007  . Gastrointestinal Stroma Tumor,  Other  1960   GIST Surgery  .  ILEOCECETOMY  09/22/2007  . OVARIAN CYST REMOVAL Right 1967  . SMALL INTESTINE SURGERY  09/22/2007  . TOTAL KNEE ARTHROPLASTY Right 10/05/2019   Procedure: RIGHT TOTAL KNEE ARTHROPLASTY;  Surgeon: Tracey Pel, MD;  Location: [facility];  Service: Orthopedics;  Laterality: Right;  . TOTAL VAGINAL HYSTERECTOMY  1986   Fibroids    Her Family History Is Significant For: Family History  Problem Relation [name] of Onset  . Congestive  Heart Failure Mother   . Dementia Mother   . Heart disease Father   . Diabetes Father   . Lung cancer Son     Her Social History Is Significant For: Social History   Socioeconomic History  . Marital status: Widowed    Spouse name: Not on file  . Number of children: 2  . Years of education: Bachelors  . Highest education level: Not on file  Occupational History  . Not on file  Tobacco Use  . Smoking status: Never Smoker  . Smokeless tobacco: Never Used  Vaping Use  . Vaping Use: Never used  Substance and Sexual Activity  . Alcohol use: No  . Drug use: No  . Sexual activity: Not Currently    Birth control/protection: Post-menopausal  Other Topics Concern  . Not on file  Social History Narrative   Lives at home alone.   Right-handed.   No caffeine use.      Diet: Eats anything, like fruits, vegetables, regular diet      Do you drink/ eat things with caffeine? No      Marital status:  Widowed                             What year were you married ? 1965      Do you live in a house, apartment,assistred living, condo, trailer, etc.)? Apartment      Is it one or more stories?  One Story      How many persons live in your home ? 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  No                            Type & how often       ADVANCED DIRECTIVES (Please bring copies)      Do you have a living will? Yes      Do you have a DNR form? Yes                      If not, do you want to discuss one?       Do you have signed POA?HPOA forms?  Yes               If so, please bring to your appointment      FUNCTIONAL STATUS- To be completed by Spouse / child / Staff       Do you have difficulty bathing or dressing yourself ? No   Do you have difficulty preparing food or eating ? No      Do you have difficulty managing  your mediation ? No      Do you have  difficulty managing your finances ? No      Do you have difficulty affording your medication ? No      Social Determinants of Radio broadcast assistant Strain: Not on file  Food Insecurity: Not on file  Transportation Needs: Not on file  Physical Activity: Not on file  Stress: Not on file  Social Connections: Not on file    Her Allergies Are:  Allergies  Allergen Reactions  . Lisinopril Cough  . Penicillins Itching    50 years ago  . Adhesive [Tape] Itching  . Atorvastatin Itching  . Dilaudid [Hydromorphone Hcl] Itching  . Hydromorphone Itching  :   Her Current Medications Are:  Outpatient Encounter Medications as of 03/29/2020  Medication Sig  . acetaminophen (TYLENOL) 325 MG tablet Take 650 mg by mouth every 4 (four) hours as needed.  . ALPRAZolam (XANAX) 0.5 MG tablet TAKE (1) TABLET TWICE DAILY AS NEEDED FOR ANXIETY.  Marland Kitchen aspirin 81 MG chewable tablet Chew by mouth daily.  . carbidopa-levodopa (SINEMET IR) 10-100 MG tablet Take 1 tablet by mouth 3 (three) times daily.  . clindamycin (CLEOCIN) 150 MG capsule Take 600mg  1 hour prior to dental procedure  . dorzolamide (TRUSOPT) 2 % ophthalmic solution Place 1 drop into both eyes 2 (two) times daily.  . fexofenadine (ALLEGRA) 60 MG tablet Take 60 mg by mouth 2 (two) times daily.  . fluticasone (FLONASE) 50 MCG/ACT nasal spray Place 1 spray into both nostrils daily.  Marland Kitchen latanoprost (XALATAN) 0.005 % ophthalmic solution Place 1 drop into both eyes at bedtime.  Marland Kitchen lithium carbonate 150 MG capsule Take 1 capsule (150 mg total) by mouth 2 (two) times daily with a meal. (Patient taking differently: Take 300 mg by mouth 2 (two) times daily with a meal.)  . losartan (COZAAR) 50 MG tablet Take 1 tablet (50 mg total) by mouth daily.  Marland Kitchen MAGNESIUM PO Take by mouth.  . Multiple Vitamin (MULTIVITAMIN) capsule Take 1 capsule by mouth daily.  Marland Kitchen omeprazole (PRILOSEC) 20 MG capsule Take 1 capsule (20 mg  total) by mouth in the morning and at bedtime.  . polyethylene glycol (MIRALAX / GLYCOLAX) 17 g packet Take 17 g by mouth daily.   No facility-administered encounter medications on file as of 03/29/2020.  :   Review of Systems:  Out of a complete 14 point review of systems, all are reviewed and negative with the exception of these symptoms as listed below:  Review of Systems  Neurological:       Here for consult on p/d pt has been on carb/levo since January has noted some improvement in her tremors. Pt reports she was d/x 4-5 years ago with p/d resides [facility]    Objective:  Neurological Exam  Physical Exam Physical Examination:   Vitals:   03/29/20 1037  BP: 122/74  Pulse: (!) 53  SpO2: 97%    General Examination: The patient is a very pleasant 80 y.o. female in no acute distress. She appears well-developed and well-nourished and well groomed.   HEENT: Normocephalic, atraumatic, pupils are equal, round and reactive to light, extraocular tracking is impaired.  She has slow eye movements and slow eye blinking rate.  She has mild to moderate facial masking and moderate nuchal rigidity.  She has upper body tilt to the right when seated.  Hearing is grossly intact, speech is slow and mildly hypophonic.  She has minimal dysarthria.  Airway examination reveals mild to moderate mouth dryness, tongue protrudes  centrally and palate elevates symmetrically.  She has no carotid bruits.    Chest: Clear to auscultation without wheezing, rhonchi or crackles noted.  Heart: S1+S2+0, regular and normal without murmurs, rubs or gallops noted.   Abdomen: Soft, non-tender and non-distended with normal bowel sounds appreciated on auscultation.  Extremities: There is no pitting edema in the distal lower extremities bilaterally.   Skin: Warm and dry without trophic changes noted.  Musculoskeletal: exam reveals discomfort in her right knee and decreased range of motion in the right leg.    Neurologically:  Mental status: The patient is awake, alert and oriented, she has mild memory issues, difficulty relating her own history and details.  Details are provided by her son.  She has slowness in her thinking, bradyphrenia.  Cranial nerves II - XII are as described above under HEENT exam.   Motor exam: Thin bulk, global strength of 4 out of 5, she has increase in tone in the right more than left upper extremity with mild cogwheeling noted on the right, no resting tremor, minimal postural tremor in both upper extremities.  Reflexes are 1+ in the upper extremities.   Fine motor skills are moderately impaired on the right side including finger taps, hand movements and foot taps.  Slightly better on the left. Cerebellar testing: No dysmetria or intention tremor on finger to nose testing.  Sensory exam: intact to light touch.  Gait, station and balance: She stands with mild difficulty and posture is mild to moderately stooped, she has a slight lean to the right.  She walks with decreased stride length and decreased pace, decreased arm swing bilaterally, more so on the right.  She walks slowly and cautiously, did not bring her walker today.  She is able to walk a few steps without assistance.  She turns slowly.  Balance is impaired.   Assessment and Plan:   In summary, Hattye W Probert is a very pleasant 80 y.o.-year old female with an underlying complex medical history of hypertension, anxiety, arthritis, status post right total knee replacement, history of diplopia, tremor, bradycardia, mood disorder, status post knee replacement surgery, recent hospitalization for confusion, functional decline and headache, who presents for evaluation of her parkinsonism.  She had seen Dr. Krista [name] in this office few years ago and there was concern for parkinsonism and diplopia at the time.  She subsequently followed with Dr. Laurena [name] who treated her for essential tremor and parkinsonism.  She was supposed to  start Sinemet last year in March but never did from what I can gather. She was on ropinirole for some time and also tried Mysoline for tremor.  History and examination are supportive of parkinsonism, some right-sided lateralization is noted.  It is difficult to say for sure if she has idiopathic Parkinson's disease versus atypical parkinsonism.  She has benefited from levodopa therapy and she is advised to continue with her current medication regimen with generic Sinemet 10-100 mg strength 1 pill 3 times daily.  She did not need a refill today.  As far as her question regarding her lithium, she is advised to talk to her prescribing provider regarding changing the dose of lithium.  Her balance is impaired, she is advised to use her walker at all times.  She did not bring her walking aid today, left it in the car and her son reports that she was holding onto him.  We talked about the importance of constipation control.  She is advised to continue with her current supportive  care as well.  She has done better with medication compliance since her recent hospitalization as I understand.  She is advised to follow-up in this office routinely to see one of our nurse practitioners in about 3 to 4 months, sooner if needed.  I answered all the questions today and the patient and her son were in agreement. Tracey Age, MD, PhD

## 2020-03-30 DIAGNOSIS — M542 Cervicalgia: Secondary | ICD-10-CM | POA: Diagnosis not present

## 2020-03-30 DIAGNOSIS — R29898 Other symptoms and signs involving the musculoskeletal system: Secondary | ICD-10-CM | POA: Diagnosis not present

## 2020-03-31 ENCOUNTER — Telehealth: Payer: Self-pay | Admitting: *Deleted

## 2020-03-31 NOTE — Telephone Encounter (Signed)
Any kind of Muscle relaxant can cause issues with her confusion and gait especially with her Parkinson. Can she come and see someone in [facility] office next week. ? Or see if [facility] has place in her schedule?

## 2020-03-31 NOTE — Telephone Encounter (Signed)
Patient called and stated that she has had a Stiff Neck for months. Stated that she saw her PT yesterday and they suggested patient to be taking a Muscle Relaxer.  Patient is calling requesting one.  Offered her an appointment at office for today and she stated that she has to give a 2 day notice to transportation. No soon appointment available for [facility].   Please Advise.

## 2020-03-31 NOTE — Telephone Encounter (Signed)
Appointment scheduled for [facility] with Dr. Lyndel Safe next Friday. Offered sooner appointment at office but patient stated that it would be easier for her to stay at [facility].

## 2020-04-04 ENCOUNTER — Other Ambulatory Visit: Payer: Self-pay | Admitting: Internal Medicine

## 2020-04-05 NOTE — Telephone Encounter (Signed)
RX last filled by another provider, I will send to Dr.Gupta to review and advise if ok to provide refills

## 2020-04-07 ENCOUNTER — Encounter: Payer: Self-pay | Admitting: Internal Medicine

## 2020-04-07 ENCOUNTER — Non-Acute Institutional Stay: Payer: Medicare PPO | Admitting: Internal Medicine

## 2020-04-07 ENCOUNTER — Other Ambulatory Visit: Payer: Self-pay

## 2020-04-07 VITALS — BP 146/82 | HR 52 | Temp 97.3°F | Ht 62.0 in | Wt 149.0 lb

## 2020-04-07 DIAGNOSIS — C49A3 Gastrointestinal stromal tumor of small intestine: Secondary | ICD-10-CM

## 2020-04-07 DIAGNOSIS — Z96651 Presence of right artificial knee joint: Secondary | ICD-10-CM

## 2020-04-07 DIAGNOSIS — G4486 Cervicogenic headache: Secondary | ICD-10-CM | POA: Diagnosis not present

## 2020-04-07 DIAGNOSIS — F304 Manic episode in full remission: Secondary | ICD-10-CM | POA: Diagnosis not present

## 2020-04-07 DIAGNOSIS — I10 Essential (primary) hypertension: Secondary | ICD-10-CM | POA: Diagnosis not present

## 2020-04-07 DIAGNOSIS — G20 Parkinson's disease: Secondary | ICD-10-CM | POA: Diagnosis not present

## 2020-04-07 DIAGNOSIS — H9313 Tinnitus, bilateral: Secondary | ICD-10-CM

## 2020-04-07 DIAGNOSIS — M542 Cervicalgia: Secondary | ICD-10-CM

## 2020-04-07 MED ORDER — METHOCARBAMOL 500 MG PO TABS: 250.0000 mg | ORAL_TABLET | Freq: Every day | ORAL | 0 refills | Status: DC | PRN

## 2020-04-07 MED ORDER — GABAPENTIN 100 MG PO CAPS: 100.0000 mg | ORAL_CAPSULE | Freq: Two times a day (BID) | ORAL | 3 refills | Status: DC

## 2020-04-07 NOTE — Patient Instructions (Signed)
Robaxin 250 mg QD for muscle spasms. Can make you sleepy Can take upto 3gm total of Tylenol in a day. Take Gabapentin 100 mg twice a day Divide Lithium to 150 mg twice a day

## 2020-04-08 NOTE — Progress Notes (Signed)
Location: [place] of Service:  Clinic (12)  Provider:   Code Status:  Goals of Care:  Advanced Directives 02/02/2020  Does Patient Have a Medical Advance Directive? Yes  Type of Advance Directive [place]  Does patient want to make changes to medical advance directive? No - Patient declined  Copy of [place] in Chart? Yes - validated most recent copy scanned in chart (See row information)  Would patient like information on creating a medical advance directive? -  Pre-existing out of facility DNR order (yellow form or pink MOST form) -     Chief Complaint  Patient presents with  . Acute Visit    Patient returns to the clinic for her still neck muscles from the parkinsons.     HPI: Patient is a 80 y.o. 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 She was seen by neurology and was started on Sinemet. Patient made dramatic improvement  Her Acute issues Neck pain and stiffness Patient had a work-up done in 2020 with an MRI of the neck showing degenerative changes. Patient states that her pain is worse and is unable to do therapy due to pain.  Sinemet does not seem to help. Also complaining of pain in her head.  She states it hurts when she touches anywhere in her head. No neurological symptoms like numbness or weakness in any extremity. No red flag symptoms Parkinson Doing very  well on Sinemet just saw neurology Tinnitus Continues to be the issue.  Her MRI of head was negative in 1221 Was seen by ENT Bipolar disorder Has been on high doses of lithium  the dose had to be reduced because of side effects especially  bradycardia.  Patient states that she thinks that her symptoms of mania are coming back.   Past Medical History:  Diagnosis Date  . Anxiety   . Arthritis   . Cardiac conduction disorder 03/30/2012   Overview:  STORY: ETT 03/09/2012 Echo 03/07/2012 normal Dr Einar Gip, bradycardia felt due to glaucoma eye drops  . Diverticulosis of colon   . GIST (gastrointestinal stroma tumor), malignant, colon ([place])   . Heart murmur   . History of colon polyps 10/24/2008  . Hypertension   . Major neurocognitive disorder due to Parkinson's disease, possible    Tremors possible parkinsons  . Manic disorder, single episode, in full remission ([place]) 12/01/2009  . Sixth nerve palsy     Past Surgical History:  Procedure Laterality Date  . BILATERAL SALPINGOOPHORECTOMY  09/22/2007  . Gastrointestinal Stroma Tumor,  Other  1960   GIST Surgery  . ILEOCECETOMY  09/22/2007  . OVARIAN CYST REMOVAL Right 1967  . SMALL INTESTINE SURGERY  09/22/2007  . TOTAL KNEE ARTHROPLASTY Right 10/05/2019   Procedure: RIGHT TOTAL KNEE ARTHROPLASTY;  Surgeon: Meredith Pel, MD;  Location: [place];  Service: Orthopedics;  Laterality: Right;  . TOTAL VAGINAL HYSTERECTOMY  1986   Fibroids    Allergies  Allergen Reactions  .  Lisinopril Cough  . Penicillins Itching    50 years ago  . Adhesive [Tape] Itching  . Atorvastatin Itching  . Dilaudid [Hydromorphone Hcl] Itching  . Hydromorphone Itching    Outpatient Encounter Medications as of 04/07/2020  Medication Sig  . acetaminophen (TYLENOL) 325 MG tablet Take 650 mg by mouth every 4 (four) hours as needed.  . ALPRAZolam (XANAX) 0.5 MG tablet TAKE (1) TABLET TWICE DAILY AS NEEDED FOR ANXIETY.  Marland Kitchen aspirin 81 MG chewable tablet Chew by mouth  daily.  . carbidopa-levodopa (SINEMET IR) 10-100 MG tablet Take 1 tablet by mouth 3 (three) times daily.  . clindamycin (CLEOCIN) 150 MG capsule Take 600mg  1 hour prior to dental procedure  . dorzolamide (TRUSOPT) 2 % ophthalmic solution Place 1 drop into both eyes 2 (two) times daily.  . fexofenadine (ALLEGRA) 60 MG tablet Take 60 mg by mouth 2 (two) times daily as needed.  . fluticasone (FLONASE) 50 MCG/ACT nasal spray Place 1 spray into both nostrils daily.  Marland Kitchen gabapentin (NEURONTIN) 100 MG capsule Take 1 capsule (100 mg total) by mouth 2 (two) times daily.  Marland Kitchen latanoprost (XALATAN) 0.005 % ophthalmic solution INSTILL 1 DROP IN [facility] EYE AT BEDTIME.  Marland Kitchen lithium carbonate 150 MG capsule Take 1 capsule (150 mg total) by mouth 2 (two) times daily with a meal. (Patient taking differently: Take 300 mg by mouth 2 (two) times daily with a meal.)  . losartan (COZAAR) 50 MG tablet Take 1 tablet (50 mg total) by mouth daily.  Marland Kitchen MAGNESIUM PO Take by mouth.  . methocarbamol (ROBAXIN) 500 MG tablet Take 0.5 tablets (250 mg total) by mouth daily as needed for muscle spasms.  . Multiple Vitamin (MULTIVITAMIN) capsule Take 1 capsule by mouth daily.  Marland Kitchen omeprazole (PRILOSEC) 20 MG capsule Take 1 capsule (20 mg total) by mouth in the morning and at bedtime.  . polyethylene glycol (MIRALAX / GLYCOLAX) 17 g packet Take 17 g by mouth daily.   No facility-administered encounter medications on file as of 04/07/2020.    Review of Systems:  Review of Systems  Constitutional: Positive for activity change.  HENT: Negative.   Respiratory: Negative.   Cardiovascular: Negative.   Gastrointestinal: Negative.   Genitourinary: Negative.   Musculoskeletal: Positive for arthralgias, myalgias, neck pain and neck stiffness.  Skin: Negative.   Neurological: Negative for dizziness.  Psychiatric/Behavioral: Positive for dysphoric mood. The patient is nervous/anxious and is hyperactive.     Health Maintenance  Topic Date Due   . Hepatitis C Screening  Never done  . COVID-19 Vaccine (4 - Booster for Moderna series) 05/22/2020  . TETANUS/TDAP  08/19/2021  . INFLUENZA VACCINE  Completed  . DEXA SCAN  Completed  . PNA vac Low Risk Adult  Completed  . HPV VACCINES  Aged Out    Physical Exam: Vitals:   04/07/20 1141  BP: (!) 146/82  Pulse: (!) 52  Temp: (!) 97.3 F (36.3 C)  SpO2: 97%  Weight: 149 lb (67.6 kg)  Height: 5\' 2"  (1.575 m)   Body mass index is 27.25 kg/m. Physical Exam Vitals reviewed.  Constitutional:      Appearance: Normal appearance.  HENT:     Head: Normocephalic.     Nose: Nose normal.     Mouth/Throat:     Mouth: Mucous membranes are moist.     Pharynx: Oropharynx is clear.  Eyes:     Pupils: Pupils are equal, round, and reactive to light.  Cardiovascular:  Rate and Rhythm: Normal rate and regular rhythm.     Pulses: Normal pulses.  Pulmonary:     Effort: Pulmonary effort is normal.     Breath sounds: Normal breath sounds.  Abdominal:     General: Abdomen is flat. Bowel sounds are normal.     Palpations: Abdomen is soft.  Musculoskeletal:        General: No swelling.     Cervical back: Neck supple.     Comments: Unable to move her neck with Extension and Flexion due to Pain  Skin:    General: Skin is warm and dry.  Neurological:     General: No focal deficit present.     Mental Status: She is alert and oriented to person, place, and time.  Psychiatric:        Mood and Affect: Mood normal.        Thought Content: Thought content normal.     Labs reviewed: Basic Metabolic Panel: Recent Labs    10/01/19 1139 01/12/20 1610 01/12/20 2122 01/12/20 2123 01/13/20 0519 01/26/20 0000  NA 140 141  --   --  143 139  K 4.4 4.9  --   --  3.9 5.1  CL 109 112*  --   --  115* 109*  CO2 24 22  --   --  22 25*  GLUCOSE 91 105*  --   --  102*  --   BUN 20 18  --   --  20 19  CREATININE 0.84 1.13*  --   --  0.98 0.9  CALCIUM 10.9* 10.8*  --  10.5* 9.8 10.0  TSH  --    --  2.734  --   --   --    Liver Function Tests: Recent Labs    05/28/19 0000 01/12/20 1610 01/26/20 0000  AST 22 43* 11*  ALT 13 23 12   ALKPHOS 101 85 81  BILITOT  --  0.8  --   PROT  --  7.2  --   ALBUMIN 4.6 4.2 3.5   No results for input(s): LIPASE, AMYLASE in the last 8760 hours. No results for input(s): AMMONIA in the last 8760 hours. CBC: Recent Labs    05/28/19 0000 05/28/19 0000 10/01/19 1139 01/12/20 1610 01/13/20 0519 01/26/20 0000  WBC 8.7   < > 8.7 10.2 8.3 7.1  NEUTROABS 7,439  --   --   --   --  4,828.00  HGB 14.1  --  12.5 13.1 12.2 11.7*  HCT 41  --  41.1 42.2 38.9 35*  MCV  --   --  102.0* 100.7* 100.5*  --   PLT 336  --  324 326 272 294   < > = values in this interval not displayed.   Lipid Panel: No results for input(s): CHOL, HDL, LDLCALC, TRIG, CHOLHDL, LDLDIRECT in the last 8760 hours. No results found for: HGBA1C  Procedures since last visit: No results found.  Assessment/Plan 1. Neck pain Has Chronic Degenerative changes in MRI ? Due to Parkinson. That is what Neurology told her but she says no Improvement with Sinemet. All other symptoms do improve with Sinemet Start on Robaxin QD Also Start on Neurontin 100 mg BID Keep Working with therapy If No Improvement Consider Follow up with Dr Coletta Memos her previous Neurosurgery   2. Cervicogenic headache Head MRI was negative in 12/21 ? Reflected pain from Neck Will see if Neurontin Helps  3. Parkinson's disease ([place]) Doing well on Sinemet  Follows with Neurology  4. Tinnitus of both ears Saw ENT No New Recommendations  5 Essential (primary) hypertension Continue Cozaar  6 Bipolar I disorder, single manic episode, in full remission ([place]) Wants to Know if we can increase her Lithium again Discussed with the patient that she is having issues with bradycardia and hypercalcemia due to her lithium At this time she is going to split her dose to 150 twice daily and see if it helps Otherwise  possible consideration of Depakote or psych referral 7 History of total right knee replacement Doing very well with the pain control  Continues to work with therapy    Labs/tests ordered:  * No order type specified * Next appt:  05/04/2020

## 2020-04-11 DIAGNOSIS — H401113 Primary open-angle glaucoma, right eye, severe stage: Secondary | ICD-10-CM | POA: Diagnosis not present

## 2020-04-11 DIAGNOSIS — H401121 Primary open-angle glaucoma, left eye, mild stage: Secondary | ICD-10-CM | POA: Diagnosis not present

## 2020-04-14 ENCOUNTER — Other Ambulatory Visit: Payer: Self-pay | Admitting: Internal Medicine

## 2020-04-15 ENCOUNTER — Other Ambulatory Visit: Payer: Self-pay | Admitting: Internal Medicine

## 2020-05-01 ENCOUNTER — Telehealth: Payer: Self-pay | Admitting: *Deleted

## 2020-05-01 NOTE — Telephone Encounter (Signed)
Pt called wanting to confirm next appt. Advised it is 07/10/20 at 9am, check in 830am. She verbalized understanding.

## 2020-05-04 ENCOUNTER — Non-Acute Institutional Stay: Payer: Medicare PPO | Admitting: Nurse Practitioner

## 2020-05-04 ENCOUNTER — Other Ambulatory Visit: Payer: Self-pay

## 2020-05-04 ENCOUNTER — Encounter: Payer: Self-pay | Admitting: Nurse Practitioner

## 2020-05-04 DIAGNOSIS — I10 Essential (primary) hypertension: Secondary | ICD-10-CM

## 2020-05-04 DIAGNOSIS — R609 Edema, unspecified: Secondary | ICD-10-CM | POA: Diagnosis not present

## 2020-05-04 DIAGNOSIS — F304 Manic episode in full remission: Secondary | ICD-10-CM

## 2020-05-04 DIAGNOSIS — G8929 Other chronic pain: Secondary | ICD-10-CM

## 2020-05-04 DIAGNOSIS — R001 Bradycardia, unspecified: Secondary | ICD-10-CM

## 2020-05-04 DIAGNOSIS — K219 Gastro-esophageal reflux disease without esophagitis: Secondary | ICD-10-CM | POA: Diagnosis not present

## 2020-05-04 DIAGNOSIS — M5481 Occipital neuralgia: Secondary | ICD-10-CM | POA: Diagnosis not present

## 2020-05-04 DIAGNOSIS — G20 Parkinson's disease: Secondary | ICD-10-CM

## 2020-05-04 DIAGNOSIS — M25561 Pain in right knee: Secondary | ICD-10-CM | POA: Diagnosis not present

## 2020-05-04 DIAGNOSIS — R4189 Other symptoms and signs involving cognitive functions and awareness: Secondary | ICD-10-CM

## 2020-05-04 MED ORDER — DULOXETINE HCL 30 MG PO CPEP: 30.0000 mg | ORAL_CAPSULE | Freq: Every day | ORAL | 3 refills | Status: DC

## 2020-05-04 NOTE — Assessment & Plan Note (Signed)
Hx of Parkinson's, stable(diplopiaR+L lateral,tremor in fingersmuch improved), onSinemet f/u Neurology.

## 2020-05-04 NOTE — Assessment & Plan Note (Signed)
Hypercalcemia,pending endocrinology, Ca10 01/26/20,PTH 79 01/12/20. GDR of Lithium may help

## 2020-05-04 NOTE — Progress Notes (Addendum)
Location:   clinic [place]   Place of Service:  Clinic (12) Provider: Marlana Latus NP  Code Status: DNR Goals of Care: [place] Advanced Directives 05/04/2020  Does Patient Have a Medical Advance Directive? Yes  Type of Advance Directive Living will;Healthcare Power of Attorney  Does patient want to make changes to medical advance directive? No - Patient declined  Copy of [place] in Chart? Yes - validated most recent copy scanned in chart (See row information)  Would patient like information on creating a medical advance directive? -  Pre-existing out of facility DNR order (yellow form or pink MOST form) -     Chief Complaint  Patient presents with  . Follow-up    4 week follow-up and discuss need for Hep C screening or exclude. Discuss headaches, stiffness in neck, right knee sensitivity     HPI: Patient is a 80 y.o. female seen today for medical management of chronic diseases.    Tinnitus, underwent eval of  ENT             Hx of GIST of small intestine s/p resection.prn MiraLax. Daily Psyllium . Constipation. Prn Metamucil, MiraLaxqdare effective. Hx of Parkinson's, stable(diplopiaR+L lateral,tremor in fingersmuch improved), onSinemet f/u Neurology. GERD, stable, on Omeprazole 20mg  bid, Hgb 11.7 01/26/20. HTN, blood pressure is controlled on Losartan, Bun/creat19/0.9 01/26/20 Bipolar disorder, stable, on Lithium, GDR due to bradycardia, Alprazolam 0.5MG  BID prn. TSH 2.734 01/12/20. May try Depakote or Psych consult.  Bradycardia, improved from 50s from 40s,Hx ofnormalizedHRafter Tylenol PM dc'd, GDR of Lithium helped too. Hypercalcemia,pending endocrinology, Ca10 01/26/20,PTH 79 01/12/20. GDR of Lithium may help BLE trace edema  OA, takes Tylenol, s/p R knee arthroplasty. Cervico occipital neuralgia, Parkinson's, cervical  degenerative changes, aches in neck R>L travels to the back of head, comes and goes, not disabling.taking Robaxin qd, Gabapentin bid  since 04/07/20, may need f/u Neurosurgery. Tracey [name] Cognitive impairment: 12/2021MRI brain showed chronic microvascular ischemic changes, no acute disease with mild cortical atrophy    Past Medical History:  Diagnosis Date  . Anxiety   . Arthritis   . Cardiac conduction disorder 03/30/2012   Overview:  STORY: ETT 03/09/2012 Echo 03/07/2012 normal Dr Einar Gip, bradycardia felt due to glaucoma eye drops  . Diverticulosis of colon   . GIST (gastrointestinal stroma tumor), malignant, colon ([place])   . Heart murmur   . History of colon polyps 10/24/2008  . Hypertension   . Major neurocognitive disorder due to Parkinson's disease, possible    Tremors possible parkinsons  . Manic disorder, single episode, in full remission ([place]) 12/01/2009  . Sixth nerve palsy     Past Surgical History:  Procedure Laterality Date  . BILATERAL SALPINGOOPHORECTOMY  09/22/2007  . Gastrointestinal Stroma Tumor,  Other  1960   GIST Surgery  . ILEOCECETOMY  09/22/2007  . OVARIAN CYST REMOVAL Right 1967  . SMALL INTESTINE SURGERY  09/22/2007  . TOTAL KNEE ARTHROPLASTY Right 10/05/2019   Procedure: RIGHT TOTAL KNEE ARTHROPLASTY;  Surgeon: Meredith Pel, MD;  Location: [place];  Service: Orthopedics;  Laterality: Right;  . TOTAL VAGINAL HYSTERECTOMY  1986   Fibroids    Allergies  Allergen Reactions  . Lisinopril Cough  . Penicillins Itching    50 years ago  . Adhesive [Tape] Itching  . Atorvastatin Itching  . Dilaudid [Hydromorphone Hcl] Itching  . Hydromorphone Itching    Allergies as of 05/04/2020      Reactions   Lisinopril Cough   Penicillins Itching  50 years ago   Adhesive [tape] Itching   Atorvastatin Itching   Dilaudid [hydromorphone Hcl] Itching   Hydromorphone Itching      Medication List       Accurate as of May 04, 2020 11:59 PM. If you  have any questions, ask your nurse or doctor.        STOP taking these medications   fluticasone 50 MCG/ACT nasal spray Commonly known as: FLONASE Stopped by: Gar Glance X Joelly Bolanos, NP   gabapentin 100 MG capsule Commonly known as: NEURONTIN Stopped by: Ashar Lewinski X Macaela Presas, NP   methocarbamol 500 MG tablet Commonly known as: Robaxin Stopped by: Julizza Sassone X Lavonya Hoerner, NP     TAKE these medications   acetaminophen 325 MG tablet Commonly known as: TYLENOL Take 650 mg by mouth every 4 (four) hours as needed.   ALPRAZolam 0.5 MG tablet Commonly known as: XANAX TAKE (1) TABLET TWICE DAILY AS NEEDED FOR ANXIETY.   aspirin 81 MG chewable tablet Chew by mouth daily.   carbidopa-levodopa 10-100 MG tablet Commonly known as: SINEMET IR TAKE ONE TABLET THREE TIMES DAILY   clindamycin 150 MG capsule Commonly known as: CLEOCIN Take $RemoveBefo'600mg'[id number]$  1 hour prior to dental procedure   fexofenadine 60 MG tablet Commonly known as: ALLEGRA Take 60 mg by mouth 2 (two) times daily as needed.   latanoprost 0.005 % ophthalmic solution Commonly known as: XALATAN INSTILL 1 DROP IN EACH EYE AT BEDTIME.   lithium carbonate 150 MG capsule Take 1 capsule (150 mg total) by mouth 2 (two) times daily with a meal.   losartan 50 MG tablet Commonly known as: COZAAR Take 1 tablet (50 mg total) by mouth daily.   MAGNESIUM PO Take by mouth.   multivitamin capsule Take 1 capsule by mouth daily.   omeprazole 20 MG capsule Commonly known as: PRILOSEC Take 1 capsule (20 mg total) by mouth in the morning and at bedtime.       Review of Systems:  Review of Systems  Constitutional: Negative for appetite change, fatigue and fever.  HENT: Positive for hearing loss and tinnitus. Negative for congestion and trouble swallowing.   Eyes: Negative for visual disturbance.       Glaucoma, diplopia R+L lateral peripheral visual fields only.   Respiratory: Negative for cough and shortness of breath.        Occasionally DOE, hacking cough   Cardiovascular: Positive for leg swelling.  Gastrointestinal: Negative for abdominal pain and constipation.  Genitourinary: Positive for frequency. Negative for dysuria and urgency.       Couple of times at night, no difficulty of returning asleep.   Musculoskeletal: Positive for arthralgias and gait problem.       Walker.   Skin: Negative for color change.  Neurological: Positive for headaches. Negative for dizziness, tremors, facial asymmetry, speech difficulty and light-headedness.       Tremors in hands/fingers at rest. L>R, near resolved,  comes and goes nature of the neck/occipital pain. Stiff neck. Pain the patent right knee, s/p TKR. Tremor is better since Sinemet.   Psychiatric/Behavioral: Positive for sleep disturbance. Negative for confusion. The patient is nervous/anxious.        Relapsed mood, feels anxious or depressive mood.     Health Maintenance  Topic Date Due  . Hepatitis C Screening  Never done  . COVID-19 Vaccine (4 - Booster for Moderna series) 05/22/2020  . INFLUENZA VACCINE  08/14/2020  . TETANUS/TDAP  08/19/2021  . DEXA SCAN  Completed  . PNA  vac Low Risk Adult  Completed  . HPV VACCINES  Aged Out    Physical Exam: Vitals:   05/04/20 1522  BP: 124/78  Pulse: (!) 55  Temp: (!) 96.9 F (36.1 C)  TempSrc: Temporal  SpO2: 98%  Weight: 156 lb (70.8 kg)  Height: $Remove'5\' 2"'MhqjYLG$  (1.575 m)   Body mass index is 28.53 kg/m. Physical Exam Vitals and nursing note reviewed.  Constitutional:      Appearance: Normal appearance.  HENT:     Head: Normocephalic and atraumatic.     Mouth/Throat:     Mouth: Mucous membranes are moist.  Eyes:     Extraocular Movements: Extraocular movements intact.     Conjunctiva/sclera: Conjunctivae normal.     Pupils: Pupils are equal, round, and reactive to light.  Neck:     Comments: Stiff neck Cardiovascular:     Rate and Rhythm: Normal rate and regular rhythm.     Heart sounds: Murmur heard.      Comments: HR in 62  bpm Pulmonary:     Breath sounds: No rales.  Abdominal:     General: Bowel sounds are normal.     Palpations: Abdomen is soft.     Tenderness: There is no abdominal tenderness.  Musculoskeletal:        General: Tenderness present.     Cervical back: Normal range of motion.     Right lower leg: No edema.     Left lower leg: Edema present.     Comments: Chronic R knee pain is improved. S/p TKR right. Stiff neck, comes and goes neck/occipital pain. Trace edema RLE  Skin:    General: Skin is warm and dry.  Neurological:     General: No focal deficit present.     Mental Status: She is alert and oriented to person, place, and time. Mental status is at baseline.     Motor: No weakness.     Coordination: Coordination abnormal.     Gait: Gait abnormal.     Comments: Resting tremor in fingers, near resolution.   Psychiatric:        Mood and Affect: Mood normal.        Behavior: Behavior normal.        Thought Content: Thought content normal.        Judgment: Judgment normal.     Labs reviewed: Basic Metabolic Panel: Recent Labs    10/01/19 1139 01/12/20 1610 01/12/20 2122 01/12/20 2123 01/13/20 0519 01/26/20 0000  NA 140 141  --   --  143 139  K 4.4 4.9  --   --  3.9 5.1  CL 109 112*  --   --  115* 109*  CO2 24 22  --   --  22 25*  GLUCOSE 91 105*  --   --  102*  --   BUN 20 18  --   --  20 19  CREATININE 0.84 1.13*  --   --  0.98 0.9  CALCIUM 10.9* 10.8*  --  10.5* 9.8 10.0  TSH  --   --  2.734  --   --   --    Liver Function Tests: Recent Labs    05/28/19 0000 01/12/20 1610 01/26/20 0000  AST 22 43* 11*  ALT $Re'13 23 12  'QxY$ ALKPHOS 101 85 81  BILITOT  --  0.8  --   PROT  --  7.2  --   ALBUMIN 4.6 4.2 3.5   No results for input(s):  LIPASE, AMYLASE in the last 8760 hours. No results for input(s): AMMONIA in the last 8760 hours. CBC: Recent Labs    05/28/19 0000 05/28/19 0000 10/01/19 1139 01/12/20 1610 01/13/20 0519 01/26/20 0000  WBC 8.7   < > 8.7 10.2 8.3  7.1  NEUTROABS 7,439  --   --   --   --  4,828.00  HGB 14.1  --  12.5 13.1 12.2 11.7*  HCT 41  --  41.1 42.2 38.9 35*  MCV  --   --  102.0* 100.7* 100.5*  --   PLT 336  --  324 326 272 294   < > = values in this interval not displayed.   Lipid Panel: No results for input(s): CHOL, HDL, LDLCALC, TRIG, CHOLHDL, LDLDIRECT in the last 8760 hours. No results found for: HGBA1C  Procedures since last visit: No results found.  Assessment/Plan  Parkinson disease ([place]) Hx of Parkinson's, stable(diplopiaR+L lateral,tremor in fingersmuch improved), onSinemet f/u Neurology.  GERD (gastroesophageal reflux disease) stable, on Omeprazole 20mg  bid, Hgb 11.7 01/26/20.   Essential (primary) hypertension blood pressure is controlled on Losartan, Bun/creat19/0.9 01/26/20   Bipolar I disorder, single manic episode, in full remission ([place]) Bipolar disorder, stable, on Lithium, GDR due to bradycardia, Alprazolam 0.5MG  BID prn. TSH 2.734 01/12/20. May try Depakote or Psych consult.    Bradycardia Bradycardia, improved from 50s from 40s,Hx ofnormalizedHRafter Tylenol PM dc'd, GDR of Lithium helped too.  Hypercalcemia Hypercalcemia,pending endocrinology, Ca10 01/26/20,PTH 79 01/12/20. GDR of Lithium may help   Edema trace  Right knee pain takes Tylenol, s/p R knee arthroplasty.certain way to move her right knee causes pain.    Cervico-occipital neuralgia Cervico occipital neuralgia, Parkinson's, cervical degenerative changes, aches in neck R>L travels to the back of head, worsened, up to the top of her head,  comes and goes, not disabling.taking Robaxin qd, Gabapentin bid  since 04/07/20-no efficacy-will dc.  Referral to Neurosurgery. Will try Cymbalta 30mg  qd.  05/11/20 failed Cymbalta-patient stated it keeps her awake at night.    Cognitive decline Cognitive impairment: 12/2021MRI brain showed chronic microvascular ischemic changes, no acute disease with mild  cortical atrophy    Labs/tests ordered:  CMP/eGFR 05/11/20  Next appt:  05/18/2020

## 2020-05-04 NOTE — Assessment & Plan Note (Signed)
Cognitive impairment: 12/2021MRI brain showed chronic microvascular ischemic changes, no acute disease with mild cortical atrophy

## 2020-05-04 NOTE — Assessment & Plan Note (Signed)
Bipolar disorder, stable, on Lithium, GDR due to bradycardia, Alprazolam 0.5MG  BID prn. TSH 2.734 01/12/20. May try Depakote or Psych consult.

## 2020-05-04 NOTE — Assessment & Plan Note (Signed)
trace 

## 2020-05-04 NOTE — Assessment & Plan Note (Signed)
blood pressure is controlled on Losartan, Bun/creat19/0.9 01/26/20

## 2020-05-04 NOTE — Assessment & Plan Note (Addendum)
Cervico occipital neuralgia, Parkinson's, cervical degenerative changes, aches in neck R>L travels to the back of head, worsened, up to the top of her head,  comes and goes, not disabling.taking Robaxin qd, Gabapentin bid  since 04/07/20-no efficacy-will dc.  Referral to Neurosurgery. Will try Cymbalta 30mg  qd.  05/11/20 failed Cymbalta-patient stated it keeps her awake at night.

## 2020-05-04 NOTE — Assessment & Plan Note (Signed)
takes Tylenol, s/p R knee arthroplasty.certain way to move her right knee causes pain.

## 2020-05-04 NOTE — Assessment & Plan Note (Signed)
Bradycardia, improved from 50s from 40s,Hx ofnormalizedHRafter Tylenol PM dc'd, GDR of Lithium helped too.

## 2020-05-04 NOTE — Assessment & Plan Note (Signed)
stable, on Omeprazole 20mg  bid, Hgb 11.7 01/26/20.

## 2020-05-04 NOTE — Patient Instructions (Addendum)
Non-fasting labs next week to follow-up on medication change  Keep pending follow-up appointment 05/18/2020

## 2020-05-05 ENCOUNTER — Encounter: Payer: Self-pay | Admitting: Nurse Practitioner

## 2020-05-11 ENCOUNTER — Other Ambulatory Visit: Payer: Self-pay

## 2020-05-11 ENCOUNTER — Telehealth: Payer: Self-pay | Admitting: Nurse Practitioner

## 2020-05-11 ENCOUNTER — Other Ambulatory Visit: Payer: Medicare PPO

## 2020-05-11 DIAGNOSIS — M5481 Occipital neuralgia: Secondary | ICD-10-CM

## 2020-05-11 NOTE — Addendum Note (Signed)
Addended by: Blanche Scovell X on: 05/11/2020 10:25 AM   Modules accepted: Orders

## 2020-05-11 NOTE — Telephone Encounter (Signed)
Pt called to say that she spoke with [facility]. They stated she was a candiate for their practice with her "migraine" condition.  She seen [facility] with Dr Rexene Alberts on 03/29/20 & did not feel that appt was successful either.  Please advise from here Thanks, Vilinda Blanks

## 2020-05-12 LAB — COMPLETE METABOLIC PANEL WITH GFR
AG Ratio: 2 (calc) (ref 1.0–2.5)
ALT: 13 U/L (ref 6–29)
AST: 19 U/L (ref 10–35)
Albumin: 4.3 g/dL (ref 3.6–5.1)
Alkaline phosphatase (APISO): 87 U/L (ref 37–153)
BUN: 18 mg/dL (ref 7–25)
CO2: 24 mmol/L (ref 20–32)
Calcium: 10.3 mg/dL (ref 8.6–10.4)
Chloride: 107 mmol/L (ref 98–110)
Creat: 0.93 mg/dL (ref 0.60–0.93)
GFR, Est African American: 68 mL/min/{1.73_m2} (ref >=60)
GFR, Est Non African American: 58 mL/min/{1.73_m2} — ABNORMAL LOW (ref >=60)
Globulin: 2.2 g/dL (calc) (ref 1.9–3.7)
Glucose, Bld: 92 mg/dL (ref 65–99)
Potassium: 4.9 mmol/L (ref 3.5–5.3)
Sodium: 139 mmol/L (ref 135–146)
Total Bilirubin: 0.5 mg/dL (ref 0.2–1.2)
Total Protein: 6.5 g/dL (ref 6.1–8.1)

## 2020-05-18 ENCOUNTER — Other Ambulatory Visit: Payer: Self-pay | Admitting: Internal Medicine

## 2020-05-18 ENCOUNTER — Encounter: Payer: Self-pay | Admitting: Nurse Practitioner

## 2020-05-18 ENCOUNTER — Other Ambulatory Visit: Payer: Self-pay

## 2020-05-18 ENCOUNTER — Non-Acute Institutional Stay: Payer: Medicare PPO | Admitting: Nurse Practitioner

## 2020-05-18 DIAGNOSIS — G20 Parkinson's disease: Secondary | ICD-10-CM

## 2020-05-18 DIAGNOSIS — K5901 Slow transit constipation: Secondary | ICD-10-CM

## 2020-05-18 DIAGNOSIS — I10 Essential (primary) hypertension: Secondary | ICD-10-CM

## 2020-05-18 DIAGNOSIS — M159 Polyosteoarthritis, unspecified: Secondary | ICD-10-CM

## 2020-05-18 DIAGNOSIS — C49A3 Gastrointestinal stromal tumor of small intestine: Secondary | ICD-10-CM

## 2020-05-18 DIAGNOSIS — R609 Edema, unspecified: Secondary | ICD-10-CM | POA: Diagnosis not present

## 2020-05-18 DIAGNOSIS — M5481 Occipital neuralgia: Secondary | ICD-10-CM | POA: Diagnosis not present

## 2020-05-18 DIAGNOSIS — M8949 Other hypertrophic osteoarthropathy, multiple sites: Secondary | ICD-10-CM

## 2020-05-18 DIAGNOSIS — R4189 Other symptoms and signs involving cognitive functions and awareness: Secondary | ICD-10-CM | POA: Diagnosis not present

## 2020-05-18 DIAGNOSIS — F304 Manic episode in full remission: Secondary | ICD-10-CM | POA: Diagnosis not present

## 2020-05-18 DIAGNOSIS — R001 Bradycardia, unspecified: Secondary | ICD-10-CM

## 2020-05-18 DIAGNOSIS — K219 Gastro-esophageal reflux disease without esophagitis: Secondary | ICD-10-CM

## 2020-05-18 DIAGNOSIS — H9313 Tinnitus, bilateral: Secondary | ICD-10-CM

## 2020-05-18 MED ORDER — TRAMADOL-ACETAMINOPHEN 37.5-325 MG PO TABS: 1.0000 | ORAL_TABLET | Freq: Four times a day (QID) | ORAL | 0 refills | Status: AC | PRN

## 2020-05-18 NOTE — Assessment & Plan Note (Addendum)
aches in neck R>L travels to the back of head, comes and goes, not disabling, pending f/u Neurosurgery. Failed Cymbalta, Gabapentin, Robaxin Will try Ultracet May consider X-ray cervical spine.

## 2020-05-18 NOTE — Progress Notes (Signed)
Location:   [facility]   Place of Service:  Clinic (12) Provider: Marlana Latus NP  Code Status: DNR Goals of Care: [place] 05/18/2020  Does Patient Have a Medical Advance Directive? Yes  Type of Advance Directive [place]  Does patient want to make changes to medical advance directive? No - Patient declined  Copy of [place] in Chart? Yes - validated most recent copy scanned in chart (See row information)  Would patient like information on creating a medical advance directive? -  Pre-existing out of facility DNR order (yellow form or pink MOST form) -     Chief Complaint  Patient presents with  . Medical Management of Chronic Issues    3 month follow up. Treatment agreement. Discuss labs.    HPI: Patient is a 80 y.o. [name] seen today for medical management of chronic diseases.      Tinnitus, underwent eval of  ENT Hx of GIST of small intestine s/p resection.prn MiraLax. Daily Psyllium . Constipation. Prn Metamucil, MiraLaxqdare effective. Hx of Parkinson's, stable(diplopiaR+L lateral,tremor in fingersmuch improved), onSinemet f/u Neurology. GERD, stable, on Omeprazole 20mg  bid, Hgb 11.7 01/26/20 HTN, blood pressure is controlled on Losartan, Bun/creat18/0.93 05/11/20 Bipolar disorder, stable, on Lithium,GDR due to bradycardia, Alprazolam 0.5MG  BID prn. TSH 2.734 01/12/20. May try Depakote or Psych consult.  Bradycardia, improved from 50s from 40s,Hx ofnormalizedHRafter Tylenol PM dc'd, GDR of Lithium helped too. Hypercalcemia,pending endocrinology, Ca10 01/26/20,PTH 79 01/12/20. GDR of Lithium may help BLE trace edema  OA, takes Tylenol, s/p R knee arthroplasty. Cervico occipital neuralgia, Parkinson's, cervical degenerative changes, aches in neck R>L travels to the back of  head, comes and goes, not disabling, pending f/u Neurosurgery. Failed Cymbalta, Gabapentin, Robaxin Cognitive impairment: 12/2021MRI brain showed chronic microvascular ischemic changes, no acute disease with mild cortical atrophy    Past Medical History:  Diagnosis Date  . Anxiety   . Arthritis   . Cardiac conduction disorder 03/30/2012   Overview:  STORY: ETT 03/09/2012 Echo 03/07/2012 normal Dr Einar Gip, bradycardia felt due to glaucoma eye drops  . Diverticulosis of colon   . GIST (gastrointestinal stroma tumor), malignant, colon ([place])   . Heart murmur   . History of colon polyps 10/24/2008  . Hypertension   . Major neurocognitive disorder due to Parkinson's disease, possible    Tremors possible parkinsons  . Manic disorder, single episode, in full remission ([place]) 12/01/2009  . Sixth nerve palsy     Past Surgical History:  Procedure Laterality Date  . BILATERAL SALPINGOOPHORECTOMY  09/22/2007  . Gastrointestinal Stroma Tumor,  Other  1960   GIST Surgery  . ILEOCECETOMY  09/22/2007  . OVARIAN CYST REMOVAL Right 1967  . SMALL INTESTINE SURGERY  09/22/2007  . TOTAL KNEE ARTHROPLASTY Right 10/05/2019   Procedure: RIGHT TOTAL KNEE ARTHROPLASTY;  Surgeon: Meredith Pel, MD;  Location: [facility];  Service: Orthopedics;  Laterality: Right;  . TOTAL VAGINAL HYSTERECTOMY  1986   Fibroids    Allergies  Allergen Reactions  . Lisinopril Cough  . Penicillins Itching    50 years ago  . Adhesive [Tape] Itching  . Atorvastatin Itching  . Dilaudid [Hydromorphone Hcl] Itching  . Hydromorphone Itching    Allergies as of 05/18/2020      Reactions   Lisinopril Cough   Penicillins Itching   50 years ago   Adhesive [tape] Itching   Atorvastatin Itching   Dilaudid [hydromorphone Hcl] Itching   Hydromorphone Itching  Medication List       Accurate as of May 18, 2020 11:59 PM. If you have any questions, ask your nurse or doctor.        acetaminophen 325 MG  tablet Commonly known as: TYLENOL Take 650 mg by mouth every 4 (four) hours as needed.   ALPRAZolam 0.5 MG tablet Commonly known as: XANAX TAKE (1) TABLET TWICE DAILY AS NEEDED FOR ANXIETY.   aspirin 81 MG chewable tablet Chew by mouth daily.   carbidopa-levodopa 10-100 MG tablet Commonly known as: SINEMET IR TAKE ONE TABLET THREE TIMES DAILY   clindamycin 150 MG capsule Commonly known as: CLEOCIN Take 600mg  1 hour prior to dental procedure   fexofenadine 60 MG tablet Commonly known as: ALLEGRA Take 60 mg by mouth 2 (two) times daily as needed.   latanoprost 0.005 % ophthalmic solution Commonly known as: XALATAN INSTILL 1 DROP IN EACH EYE AT BEDTIME.   lithium carbonate 150 MG capsule Take 1 capsule (150 mg total) by mouth 2 (two) times daily with a meal.   losartan 50 MG tablet Commonly known as: COZAAR Take 1 tablet (50 mg total) by mouth daily.   MAGNESIUM PO Take by mouth.   multivitamin capsule Take 1 capsule by mouth daily.   omeprazole 20 MG capsule Commonly known as: PRILOSEC Take 1 capsule (20 mg total) by mouth in the morning and at bedtime.   traMADol-acetaminophen 37.5-325 MG tablet Commonly known as: Ultracet Take 1 tablet by mouth every 6 (six) hours as needed for up to 7 days. Started by: Rebeca Valdivia X Talib Headley, NP       Review of Systems:  Review of Systems  Constitutional: Negative for appetite change, fatigue and fever.  HENT: Positive for hearing loss and tinnitus. Negative for congestion and trouble swallowing.   Eyes: Negative for visual disturbance.       Glaucoma, diplopia R+L lateral peripheral visual fields only.   Respiratory: Negative for cough and shortness of breath.        Occasionally DOE, hacking cough  Cardiovascular: Positive for leg swelling.  Gastrointestinal: Negative for abdominal pain and constipation.  Genitourinary: Positive for frequency. Negative for dysuria and urgency.       Couple of times at night, no difficulty of  returning asleep.   Musculoskeletal: Positive for arthralgias and gait problem.       Walker.   Skin: Negative for color change.  Neurological: Positive for headaches. Negative for dizziness, tremors, facial asymmetry, speech difficulty and light-headedness.       Tremors in hands/fingers at rest. L>R, near resolved,  comes and goes nature of the neck/occipital pain. Stiff neck. Pain the patent right knee, s/p TKR. Tremor is better since Sinemet.   Psychiatric/Behavioral: Positive for sleep disturbance. Negative for confusion. The patient is nervous/anxious.        Relapsed mood, feels anxious or depressive mood.     Health Maintenance  Topic Date Due  . Hepatitis C Screening  Never done  . COVID-19 Vaccine (4 - Booster for Moderna series) 05/22/2020  . INFLUENZA VACCINE  08/14/2020  . TETANUS/TDAP  08/19/2021  . DEXA SCAN  Completed  . PNA vac Low Risk Adult  Completed  . HPV VACCINES  Aged Out    Physical Exam: Vitals:   05/18/20 1521  BP: 124/70  Pulse: 68  Resp: 18  Temp: 97.8 F (36.6 C)  TempSrc: Temporal  SpO2: 97%  Weight: 149 lb (67.6 kg)  Height: 5\' 2"  (1.575 m)  Body mass index is 27.25 kg/m. Physical Exam Vitals and nursing note reviewed.  Constitutional:      Appearance: Normal appearance.  HENT:     Head: Normocephalic and atraumatic.     Mouth/Throat:     Mouth: Mucous membranes are moist.  Eyes:     Extraocular Movements: Extraocular movements intact.     Conjunctiva/sclera: Conjunctivae normal.     Pupils: Pupils are equal, round, and reactive to light.  Neck:     Comments: Stiff neck Cardiovascular:     Rate and Rhythm: Normal rate and regular rhythm.     Heart sounds: Murmur heard.      Comments: HR in 62 bpm Pulmonary:     Breath sounds: No rales.  Abdominal:     General: Bowel sounds are normal.     Palpations: Abdomen is soft.     Tenderness: There is no abdominal tenderness.  Musculoskeletal:        General: Tenderness present.      Cervical back: Normal range of motion.     Right lower leg: No edema.     Left lower leg: Edema present.     Comments: Chronic R knee pain is improved. S/p TKR right. Stiff neck, comes and goes neck/occipital pain. Trace edema RLE  Skin:    General: Skin is warm and dry.  Neurological:     General: No focal deficit present.     Mental Status: She is alert and oriented to person, place, and time. Mental status is at baseline.     Motor: No weakness.     Coordination: Coordination abnormal.     Gait: Gait abnormal.     Comments: Resting tremor in fingers, near resolution.   Psychiatric:        Mood and Affect: Mood normal.        Behavior: Behavior normal.        Thought Content: Thought content normal.        Judgment: Judgment normal.     Labs reviewed: Basic Metabolic Panel: Recent Labs    01/12/20 1610 01/12/20 2122 01/12/20 2123 01/13/20 0519 01/26/20 0000 05/11/20 0700  NA 141  --   --  143 139 139  K 4.9  --   --  3.9 5.1 4.9  CL 112*  --   --  115* 109* 107  CO2 22  --   --  22 25* 24  GLUCOSE 105*  --   --  102*  --  92  BUN 18  --   --  20 19 18   CREATININE 1.13*  --   --  0.98 0.9 0.93  CALCIUM 10.8*  --    < > 9.8 10.0 10.3  TSH  --  2.734  --   --   --   --    < > = values in this interval not displayed.   Liver Function Tests: Recent Labs    05/28/19 0000 01/12/20 1610 01/26/20 0000 05/11/20 0700  AST 22 [place]* 11* 19  ALT 13 23 12 13   ALKPHOS 101 85 81  --   BILITOT  --  0.8  --  0.5  PROT  --  7.2  --  6.5  ALBUMIN 4.6 4.2 3.5  --    No results for input(s): LIPASE, AMYLASE in the last 8760 hours. No results for input(s): AMMONIA in the last 8760 hours. CBC: Recent Labs    05/28/19 0000 05/28/19 0000 10/01/19 1139 01/12/20 1610  01/13/20 0519 01/26/20 0000  WBC 8.7   < > 8.7 10.2 8.3 7.1  NEUTROABS 7,439  --   --   --   --  4,828.00  HGB 14.1  --  12.5 13.1 12.2 11.7*  HCT 41  --  41.1 42.2 38.9 35*  MCV  --   --  102.0* 100.7* 100.5*   --   PLT 336  --  324 326 272 294   < > = values in this interval not displayed.   Lipid Panel: No results for input(s): CHOL, HDL, LDLCALC, TRIG, CHOLHDL, LDLDIRECT in the last 8760 hours. No results found for: HGBA1C  Procedures since last visit: No results found.  Assessment/Plan  Cervico-occipital neuralgia  aches in neck R>L travels to the back of head, comes and goes, not disabling, pending f/u Neurosurgery. Failed Cymbalta, Gabapentin, Robaxin Will try Ultracet May consider X-ray cervical spine.    Cognitive decline Cognitive impairment: 12/2021MRI brain showed chronic microvascular ischemic changes, no acute disease with mild cortical atrophy   Arthritis, degenerative takes Tylenol, s/p R knee arthroplasty.   Edema Trace edema BLE  Hypercalcemia Hypercalcemia,pending endocrinology, Ca10 01/26/20,PTH 79 01/12/20. GDR of Lithium may help   Bradycardia Bradycardia, improved from 50s from 40s,Hx ofnormalizedHRafter Tylenol PM dc'd, GDR of Lithium helped too.   Bipolar I disorder, single manic episode, in full remission ([place]) Bipolar disorder, stable, on Lithium,GDR due to bradycardia, Alprazolam 0.5MG  BID prn. TSH 2.734 01/12/20. May try Depakote or Psych consult.    Essential (primary) hypertension  blood pressure is controlled on Losartan, Bun/creat18/0.93 05/11/20   GERD (gastroesophageal reflux disease) stable, on Omeprazole 20mg  bid, Hgb 11.7 01/26/20   Parkinson disease ([place])  Parkinson's, stable(diplopiaR+L lateral,tremor in fingersmuch improved), onSinemet f/u Neurology.  Slow transit constipation Stable, continue Prn Metamucil, MiraLaxqdare effective.  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 Daily Psyllium .   Tinnitus of both ears Tinnitus, underwent eval of  ENT   Labs/tests ordered:  None  Next appt:  2-3 weeks

## 2020-05-18 NOTE — Assessment & Plan Note (Signed)
Trace edema BLE  

## 2020-05-18 NOTE — Assessment & Plan Note (Signed)
takes  Tylenol, s/p R knee arthroplasty.  

## 2020-05-18 NOTE — Assessment & Plan Note (Signed)
Cognitive impairment: 12/2021MRI brain showed chronic microvascular ischemic changes, no acute disease with mild cortical atrophy

## 2020-05-18 NOTE — Telephone Encounter (Addendum)
Patient has appointment today. Medication pend and sent to PCP Mast, Man X, NP for approval.

## 2020-05-18 NOTE — Assessment & Plan Note (Signed)
Hx of GIST of small intestine s/p resection. prn MiraLax. Daily Psyllium . 

## 2020-05-18 NOTE — Assessment & Plan Note (Signed)
blood pressure is controlled on Losartan, Bun/creat 18/0.93 05/11/20 

## 2020-05-18 NOTE — Assessment & Plan Note (Signed)
Hypercalcemia,pending endocrinology, Ca10 01/26/20,PTH 79 01/12/20. GDR of Lithium may help  

## 2020-05-18 NOTE — Assessment & Plan Note (Signed)
Parkinson's, stable(diplopiaR+L lateral,tremor in fingersmuch improved), onSinemet f/u Neurology.

## 2020-05-18 NOTE — Assessment & Plan Note (Signed)
Stable, continue Prn Metamucil, MiraLaxqdare effective.

## 2020-05-18 NOTE — Assessment & Plan Note (Signed)
stable, on Omeprazole 20mg bid, Hgb 11.7 01/26/20.  

## 2020-05-18 NOTE — Assessment & Plan Note (Signed)
Bipolar disorder, stable, on Lithium, GDR due to bradycardia, Alprazolam 0.5MG BID prn. TSH 2.734 01/12/20. May try Depakote or Psych consult.   

## 2020-05-18 NOTE — Assessment & Plan Note (Signed)
Bradycardia, improved from 50s from 40s,Hx ofnormalizedHRafter Tylenol PM dc'd, GDR of Lithium helped too. 

## 2020-05-18 NOTE — Assessment & Plan Note (Signed)
Tinnitus, underwent eval of  ENT

## 2020-05-19 ENCOUNTER — Encounter: Payer: Self-pay | Admitting: Internal Medicine

## 2020-05-19 ENCOUNTER — Encounter: Payer: Self-pay | Admitting: Nurse Practitioner

## 2020-06-03 ENCOUNTER — Other Ambulatory Visit: Payer: Self-pay | Admitting: Nurse Practitioner

## 2020-06-05 ENCOUNTER — Other Ambulatory Visit: Payer: Self-pay | Admitting: Nurse Practitioner

## 2020-06-05 ENCOUNTER — Other Ambulatory Visit: Payer: Self-pay | Admitting: Internal Medicine

## 2020-06-06 ENCOUNTER — Other Ambulatory Visit: Payer: Self-pay | Admitting: Internal Medicine

## 2020-06-08 ENCOUNTER — Non-Acute Institutional Stay: Payer: Medicare PPO | Admitting: Nurse Practitioner

## 2020-06-08 ENCOUNTER — Encounter: Payer: Self-pay | Admitting: Nurse Practitioner

## 2020-06-08 ENCOUNTER — Other Ambulatory Visit: Payer: Self-pay

## 2020-06-08 DIAGNOSIS — G20 Parkinson's disease: Secondary | ICD-10-CM | POA: Diagnosis not present

## 2020-06-08 DIAGNOSIS — K219 Gastro-esophageal reflux disease without esophagitis: Secondary | ICD-10-CM | POA: Diagnosis not present

## 2020-06-08 DIAGNOSIS — C49A3 Gastrointestinal stromal tumor of small intestine: Secondary | ICD-10-CM

## 2020-06-08 DIAGNOSIS — M5481 Occipital neuralgia: Secondary | ICD-10-CM

## 2020-06-08 DIAGNOSIS — R001 Bradycardia, unspecified: Secondary | ICD-10-CM | POA: Diagnosis not present

## 2020-06-08 DIAGNOSIS — R4189 Other symptoms and signs involving cognitive functions and awareness: Secondary | ICD-10-CM | POA: Diagnosis not present

## 2020-06-08 DIAGNOSIS — F304 Manic episode in full remission: Secondary | ICD-10-CM | POA: Diagnosis not present

## 2020-06-08 DIAGNOSIS — H9313 Tinnitus, bilateral: Secondary | ICD-10-CM

## 2020-06-08 DIAGNOSIS — M549 Dorsalgia, unspecified: Secondary | ICD-10-CM

## 2020-06-08 DIAGNOSIS — K5901 Slow transit constipation: Secondary | ICD-10-CM

## 2020-06-08 DIAGNOSIS — I10 Essential (primary) hypertension: Secondary | ICD-10-CM

## 2020-06-08 NOTE — Assessment & Plan Note (Signed)
stable, on Omeprazole 20mg  bid, Hgb 11.7 01/26/20

## 2020-06-08 NOTE — Assessment & Plan Note (Addendum)
Cervico occipital neuralgia, Parkinson's, cervical degenerative changes, aches in neck R>L travels to the back of head, comes and goes, not disabling, pending f/u Neurosurgery. Failed Cymbalta, Gabapentin, Robaxin, Ultracet(caused her too sleepy). Wants a new referral to a different neurology

## 2020-06-08 NOTE — Assessment & Plan Note (Signed)
Cognitive impairment: 12/2021MRI brain showed chronic microvascular ischemic changes, no acute disease with mild cortical atrophy

## 2020-06-08 NOTE — Assessment & Plan Note (Signed)
blood pressure is controlled on Losartan, Bun/creat18/0.93 05/11/20

## 2020-06-08 NOTE — Assessment & Plan Note (Signed)
Bradycardia, improved from 50s from 40s,Hx ofnormalizedHRafter Tylenol PM dc'd, GDR of Lithium helped too.

## 2020-06-08 NOTE — Assessment & Plan Note (Signed)
pending endocrinology, Ca10.3 05/11/20, PTH 79 01/12/20. GDR of Lithium may help

## 2020-06-08 NOTE — Assessment & Plan Note (Signed)
Prn Metamucil, MiraLax qd are effective.  

## 2020-06-08 NOTE — Assessment & Plan Note (Signed)
Bipolar disorder, stable, on Lithium,GDR due to bradycardia, Alprazolam 0.5MG  BID prn. TSH 2.734 01/12/20. May try Depakote or Psych consult.

## 2020-06-08 NOTE — Progress Notes (Signed)
Location:   clinic [facility]   Place of Service:  Clinic (12) Provider: Marlana Latus NP  Code Status: DNR Goals of Care: [place] 06/08/2020  Does Patient Have a Medical Advance Directive? Yes  Type of Advance Directive [place]  Does patient want to make changes to medical advance directive? No - Patient declined  Copy of [place] in Chart? Yes - validated most recent copy scanned in chart (See row information)  Would patient like information on creating a medical advance directive? -  Pre-existing out of facility DNR order (yellow form or pink MOST form) -     Chief Complaint  Patient presents with  . Medical Management of Chronic Issues    3 week follow up. Patients states medication has not been helping with her pain. Patient states she would like a referral to a different neurology office.     HPI: Patient is a 80 y.o. female seen today for medical management of chronic diseases.    Tinnitus, underwent eval ofENT Hx of GIST of small intestine s/p resection.prn MiraLax. Daily Psyllium . Constipation. Prn Metamucil, MiraLaxqdare effective. Hx of Parkinson's, stable(diplopiaR+L lateral,tremor in fingersmuch improved), onSinemet f/u Neurology. GERD, stable, on Omeprazole 20mg  bid, Hgb 11.7 01/26/20 HTN, blood pressure is controlled on Losartan, Bun/creat18/0.93 05/11/20 Bipolar disorder, stable, on Lithium,GDR due to bradycardia, Alprazolam 0.5MG  BID prn. TSH 2.734 01/12/20. May try Depakote or Psych consult. Bradycardia, improved from 40s,Hx ofnormalizedHRafter Tylenol PM dc'd, GDR of Lithium helped too. Hypercalcemia,pending endocrinology, Ca10.3 05/11/20, PTH 79 01/12/20. GDR of Lithium may help BLE trace edema  OA,takes Tylenol, s/p R knee arthroplasty. Cervico occipital  neuralgia, Parkinson's, cervical degenerative changes, CT/MRI 12/2019 ruled out acute process, aches in neck R>L travels to the back of head, comes and goes, not disabling, pending f/u Neurosurgery. Failed Cymbalta, Gabapentin, Robaxin, Ultracet Cognitive impairment: 12/2021MRI brain showed chronic microvascular ischemic changes, no acute disease with mild cortical atrophy     Past Medical History:  Diagnosis Date  . Anxiety   . Arthritis   . Cardiac conduction disorder 03/30/2012   Overview:  STORY: ETT 03/09/2012 Echo 03/07/2012 normal Dr Einar Gip, bradycardia felt due to glaucoma eye drops  . Diverticulosis of colon   . GIST (gastrointestinal stroma tumor), malignant, colon ([place])   . Heart murmur   . History of colon polyps 10/24/2008  . Hypertension   . Major neurocognitive disorder due to Parkinson's disease, possible    Tremors possible parkinsons  . Manic disorder, single episode, in full remission ([place]) 12/01/2009  . Sixth nerve palsy     Past Surgical History:  Procedure Laterality Date  . BILATERAL SALPINGOOPHORECTOMY  09/22/2007  . Gastrointestinal Stroma Tumor,  Other  1960   GIST Surgery  . ILEOCECETOMY  09/22/2007  . OVARIAN CYST REMOVAL Right 1967  . SMALL INTESTINE SURGERY  09/22/2007  . TOTAL KNEE ARTHROPLASTY Right 10/05/2019   Procedure: RIGHT TOTAL KNEE ARTHROPLASTY;  Surgeon: Meredith Pel, MD;  Location: [facility];  Service: Orthopedics;  Laterality: Right;  . TOTAL VAGINAL HYSTERECTOMY  1986   Fibroids    Allergies  Allergen Reactions  . Lisinopril Cough  . Penicillins Itching    50 years ago  . Adhesive [Tape] Itching  . Atorvastatin Itching  . Dilaudid [Hydromorphone Hcl] Itching  . Hydromorphone Itching    Allergies as of 06/08/2020      Reactions   Lisinopril Cough   Penicillins Itching   50 years ago  Adhesive [tape] Itching   Atorvastatin Itching   Dilaudid [hydromorphone Hcl] Itching   Hydromorphone Itching      Medication  List       Accurate as of Jun 08, 2020 11:59 PM. If you have any questions, ask your nurse or doctor.        acetaminophen 325 MG tablet Commonly known as: TYLENOL Take 650 mg by mouth every 4 (four) hours as needed.   ALPRAZolam 0.5 MG tablet Commonly known as: XANAX TAKE (1) TABLET TWICE DAILY AS NEEDED FOR ANXIETY.   aspirin 81 MG chewable tablet Chew by mouth daily.   carbidopa-levodopa 10-100 MG tablet Commonly known as: SINEMET IR TAKE ONE TABLET THREE TIMES DAILY   clindamycin 150 MG capsule Commonly known as: CLEOCIN Take 600mg  1 hour prior to dental procedure   fexofenadine 60 MG tablet Commonly known as: ALLEGRA Take 60 mg by mouth 2 (two) times daily as needed.   latanoprost 0.005 % ophthalmic solution Commonly known as: XALATAN INSTILL 1 DROP IN EACH EYE AT BEDTIME.   lithium carbonate 150 MG capsule TAKE 1 CAPSULE TWICE DAILY WITH MEALS.   losartan 50 MG tablet Commonly known as: COZAAR TAKE 1 TABLET ONCE DAILY.   MAGNESIUM PO Take by mouth.   multivitamin capsule Take 1 capsule by mouth daily.   omeprazole 20 MG capsule Commonly known as: PRILOSEC Take 1 capsule (20 mg total) by mouth in the morning and at bedtime.       Review of Systems:  Review of Systems  Constitutional: Negative for appetite change, fatigue and fever.  HENT: Positive for hearing loss and tinnitus. Negative for congestion and trouble swallowing.   Eyes: Negative for visual disturbance.       Glaucoma, diplopia R+L lateral peripheral visual fields only.   Respiratory: Negative for shortness of breath.        Occasionally DOE, hacking cough  Cardiovascular: Positive for leg swelling.  Gastrointestinal: Negative for abdominal pain and constipation.  Genitourinary: Positive for frequency. Negative for dysuria and urgency.       Couple of times at night, no difficulty of returning asleep.   Musculoskeletal: Positive for arthralgias and gait problem.       Walker.    Skin: Negative for color change.  Neurological: Positive for headaches. Negative for tremors, speech difficulty and light-headedness.       Tremors in hands/fingers at rest. L>R, near resolved,  comes and goes nature of the neck/occipital pain. Stiff neck. Pain the patent right knee, s/p TKR. Tremor is better since Sinemet.   Psychiatric/Behavioral: Positive for sleep disturbance. Negative for confusion. The patient is nervous/anxious.        Relapsed mood, feels anxious or depressive mood.     Health Maintenance  Topic Date Due  . Hepatitis C Screening  Never done  . Zoster Vaccines- Shingrix (1 of 2) 10/22/1990  . COVID-19 Vaccine (4 - Booster for Moderna series) 02/23/2020  . INFLUENZA VACCINE  08/14/2020  . TETANUS/TDAP  08/19/2021  . DEXA SCAN  Completed  . PNA vac Low Risk Adult  Completed  . HPV VACCINES  Aged Out    Physical Exam: Vitals:   06/08/20 1455  BP: 128/70  Pulse: 61  Resp: 18  Temp: (!) 96.7 F (35.9 C)  SpO2: 97%  Weight: 148 lb 9.6 oz (67.4 kg)  Height: 5\' 2"  (1.575 m)   Body mass index is 27.18 kg/m. Physical Exam Vitals and nursing note reviewed.  Constitutional:  Appearance: Normal appearance.  HENT:     Head: Normocephalic and atraumatic.     Mouth/Throat:     Mouth: Mucous membranes are moist.  Eyes:     Extraocular Movements: Extraocular movements intact.     Conjunctiva/sclera: Conjunctivae normal.     Pupils: Pupils are equal, round, and reactive to light.  Neck:     Comments: Stiff neck Cardiovascular:     Rate and Rhythm: Normal rate and regular rhythm.     Heart sounds: Murmur heard.      Comments: HR in 62 bpm Pulmonary:     Breath sounds: No rales.  Abdominal:     General: Bowel sounds are normal.     Palpations: Abdomen is soft.     Tenderness: There is no abdominal tenderness.  Musculoskeletal:        General: Tenderness present.     Cervical back: Normal range of motion.     Right lower leg: No edema.     Left  lower leg: Edema present.     Comments: Chronic R knee pain is improved. S/p TKR right. Stiff neck, comes and goes neck/occipital pain. Trace edema RLE  Skin:    General: Skin is warm and dry.  Neurological:     General: No focal deficit present.     Mental Status: She is alert and oriented to person, place, and time. Mental status is at baseline.     Motor: No weakness.     Coordination: Coordination abnormal.     Gait: Gait abnormal.     Comments: Resting tremor in fingers, near resolution.   Psychiatric:        Mood and Affect: Mood normal.        Behavior: Behavior normal.        Thought Content: Thought content normal.        Judgment: Judgment normal.     Labs reviewed: Basic Metabolic Panel: Recent Labs    01/12/20 1610 01/12/20 2122 01/12/20 2123 01/13/20 0519 01/26/20 0000 05/11/20 0700  NA 141  --   --  143 139 139  K 4.9  --   --  3.9 5.1 4.9  CL 112*  --   --  115* 109* 107  CO2 22  --   --  22 25* 24  GLUCOSE 105*  --   --  102*  --  92  BUN 18  --   --  20 19 18   CREATININE 1.13*  --   --  0.98 0.9 0.93  CALCIUM 10.8*  --    < > 9.8 10.0 10.3  TSH  --  2.734  --   --   --   --    < > = values in this interval not displayed.   Liver Function Tests: Recent Labs    01/12/20 1610 01/26/20 0000 05/11/20 0700  AST 43* 11* 19  ALT 23 12 13   ALKPHOS 85 81  --   BILITOT 0.8  --  0.5  PROT 7.2  --  6.5  ALBUMIN 4.2 3.5  --    No results for input(s): LIPASE, AMYLASE in the last 8760 hours. No results for input(s): AMMONIA in the last 8760 hours. CBC: Recent Labs    10/01/19 1139 01/12/20 1610 01/13/20 0519 01/26/20 0000  WBC 8.7 10.2 8.3 7.1  NEUTROABS  --   --   --  4,828.00  HGB 12.5 13.1 12.2 11.7*  HCT 41.1 42.2 38.9 35*  MCV  102.0* 100.7* 100.5*  --   PLT 324 326 272 294   Lipid Panel: No results for input(s): CHOL, HDL, LDLCALC, TRIG, CHOLHDL, LDLDIRECT in the last 8760 hours. No results found for: HGBA1C  Procedures since last  visit: No results found.  Assessment/Plan  Cognitive decline Cognitive impairment: 12/2021MRI brain showed chronic microvascular ischemic changes, no acute disease with mild cortical atrophy   Cervico-occipital neuralgia Cervico occipital neuralgia, Parkinson's, cervical degenerative changes, aches in neck R>L travels to the back of head, comes and goes, not disabling, pending f/u Neurosurgery. Failed Cymbalta, Gabapentin, Robaxin, Ultracet(caused her too sleepy). Wants a new referral to a different neurology    Upper back pain on right side OA,takes Tylenol, s/p R knee arthroplasty.   Hypercalcemia pending endocrinology, Ca10.3 05/11/20, PTH 79 01/12/20. GDR of Lithium may help   Bradycardia Bradycardia, improved from 50s from 40s,Hx ofnormalizedHRafter Tylenol PM dc'd, GDR of Lithium helped too.  Bipolar I disorder, single manic episode, in full remission ([place]) Bipolar disorder, stable, on Lithium,GDR due to bradycardia, Alprazolam 0.5MG  BID prn. TSH 2.734 01/12/20. May try Depakote or Psych consult.  Essential (primary) hypertension blood pressure is controlled on Losartan, Bun/creat18/0.93 05/11/20  GERD (gastroesophageal reflux disease)  stable, on Omeprazole 20mg  bid, Hgb 11.7 01/26/20  Parkinson disease ([place]) Hx of Parkinson's, stable(diplopiaR+L lateral,tremor in fingersmuch improved), onSinemet f/u Neurology.  Slow transit constipation Prn Metamucil, MiraLaxqdare effective.  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 Daily Psyllium .  Tinnitus of both ears Persisted, tinnitus, underwent eval ofENT    Labs/tests ordered:  None  Next appt:  3 months.

## 2020-06-08 NOTE — Assessment & Plan Note (Signed)
Persisted, tinnitus, underwent eval ofENT

## 2020-06-08 NOTE — Assessment & Plan Note (Signed)
Hx of Parkinson's, stable(diplopiaR+L lateral,tremor in fingersmuch improved), onSinemet f/u Neurology.

## 2020-06-08 NOTE — Assessment & Plan Note (Signed)
Hx of GIST of small intestine s/p resection. prn MiraLax. Daily Psyllium . 

## 2020-06-08 NOTE — Assessment & Plan Note (Signed)
OA,takes Tylenol, s/p R knee arthroplasty.

## 2020-06-09 ENCOUNTER — Encounter: Payer: Self-pay | Admitting: Nurse Practitioner

## 2020-06-26 ENCOUNTER — Encounter: Payer: Self-pay | Admitting: Neurology

## 2020-07-04 NOTE — Progress Notes (Signed)
Assessment/Plan:    Parkinsonism -Wonder if patient potentially has an atypical state, perhaps MSA.  She has had diplopia for at least 3 or 4 years.  She most certainly has cervical dystonia.  Parkinsonism itself is pretty mild.  -Discussed with patient the importance of staying with 1 neurologist for continuity of care.  This really is best for her.  She has seen multiple neurologists (I am the fifth).  -She is currently on carbidopa/levodopa 10/100, 1 tablet 3 times per day.  Discussed with patient that in order to get levodopa into the central nervous system, 1 needs 75 mg of carbidopa per day.  She is only getting 30 mg/day.  We will change her carbidopa/levodopa to 25/100 to accommodate that goal.  -hx confusing b/c levodopa started same time as she went down on lithium.  She does think that the levodopa helped, but 1 has to wonder if going down on the lithium was what resolved the tremor.  She does think that there was a few weeks where she was still on the lithium and the levodopa was started and she was getting better.  -Going to go ahead and do a DaTscan just to make sure we are not missing anything.  She obviously has had various forms of tremor because of the lithium, was previously diagnosed with essential tremor, and now has a newer diagnosis of Parkinson's.  2.  Bipolar d/o  -Suspect that she does have some component of lithium induced tremor.  She was diagnosed with essential tremor for years, but that diagnosis cannot be made in the face of one being on lithium.   Studies are varied, but incidate that up to 2/3 of patients on lithium will have lithium-induced tremor, even if the patients are not toxic on the medication.  This is just a common side effect of patients on lithium.  I did not recommend that she stop or taper the lithium, but rather gave her this information so that she would have it.  She was treated in the past with primidone.  I do not recommend this for lithium induced  tremor.  Her lithium dose has actually been reduced with time because of bradycardia and she did not have a lot of tremor today.  3.  Cervical dystonia with significant neck pain.  -She has done physical therapy without relief of the neck pain.  She has tried anti-inflammatories.  -She and I discussed the value of Botox.  We discussed the risks and benefits of it.  We discussed the black box warning.  She really would like to try it.  The muscles involved are primarily the left sternocleidomastoid, right splenius capitis, and especially the bilateral trapezius, left much more so than right.  She also has some involvement of the left levator scapulae.  -MRI cervical spine will be completed.  Last one was done in 2020.   Subjective:   Tracey [name] was seen today in the movement disorders clinic for neurologic consultation at the request of Mast, Man X, NP.  The consultation is for the evaluation of Parkinsons Disease.  Prior neurology records are made available to me and are reviewed.  Patient has seen several neurologist in the past including Dr. Krista Blue, Dr. Rexene Alberts, Dr. Shela Leff [facility]) and Dr. Laurena Slimmer.  She was most recently seen by Dr. Rexene Alberts in March, 2022.  She saw Dr. Krista Blue in October, 2017 with new onset diplopia, felt to be a left 6th nerve palsy by ophthalmology.  However,  when she was evaluated by Dr. Krista Blue, it was noted that she had mild parkinsonian features.  She did have a history of bipolar disorder, but was not on antipsychotic medication that I can tell (was on lithium).  Dr. Krista Blue brought her back a month later and stated that the mild parkinsonian features "could be potentially secondary due to long-term psychotropic medication treatment."  However, she did not mention which of these medications would be the source.  She did state that the patient was only "mildly symptomatic" and was told to follow-up in 1 year.  The patient ultimately transferred care to [facility] and was not seen there  until July, 2019.  She saw Dr. Shela Leff and he noted concerns for Parkinson's disease.  He stated "exam positive for decreased arm swing, high-frequency low amplitude tremor seen with action not rest, decreased arm swing, freezing gait, shuffling.  Also on exam appreciated mild cogwheeling bilaterally.  Patient does have features that are parkinsonian.  However does not completely meet criteria for Parkinson's disease."  He felt that her arthritis could be responsible for her shuffling gait and freezing and started her on primidone, 50 mg, 3 tablets daily.  She followed up several months later with a diagnosis of "parkinsonism", felt she was doing better on the primidone and was continued on ACT.  She was transferred care to Dr. Sabra Heck in June, 2020.  He felt that she had essential tremor on his first 2 visits with her.  On his last visit, in March, 2021 he noted a new diagnosis of parkinsonism and placed her on levodopa in addition to the primidone.  She transferred care to Dr. Rexene Alberts in March, 2022.  At that point in time, she noted that the patient had really only been on levodopa for 2 months, and that was only carbidopa/levodopa 10/100, 1 tablet 3 times per day (pt was started on this in the hospital).  Dr. Rexene Alberts did not change that medication.  Pt takes it at 7am/2pm/9pm.  Pt states that it helps "tremendously" with tremor and a "wiggly sensation when I stand up."   Specific Symptoms:  Tremor: Yes.  , bilateral UE, with eating Family hx of similar:  No. Voice: gotten weaker Sleep: taking xanax for sleep as she reduced lithium (reduction of lithium by 1/4) - takes 1 xanax every 3 rd day/night when she feels anxious/jittery  Vivid Dreams:  Yes.    Acting out dreams:  No. Wet Pillows: Yes.   Postural symptoms:  Yes.  , better with R TKA in November  Falls?  No. Bradykinesia symptoms: no bradykinesia noted Loss of smell:  No. Loss of taste:  No. Urinary Incontinence:  No., but has  frequency Difficulty Swallowing:  No. Handwriting, micrographia: Yes.   Trouble with ADL's:  No.  Trouble buttoning clothing: Yes.   Depression:  rarely but some anxiety Memory changes:  No. Hallucinations:  No.  visual distortions: Yes.   N/V:  No. Lightheaded:  No.  Syncope: No. Diplopia:  Yes.   But has prisms in the glasses - started about 3-4 years ago - unknown causes  Dyskinesia:  No. Prior exposure to reglan/antipsychotics: No. if she has been, it has been years ago and she doesn't recall others.  She has been treated primarily with lithium.  Has not seen psychiatry in a long time. Neuroimaging of the brain has  previously been performed.  It is available for my review today.  MRI brain was completed on January 13, 2020.  I  personally reviewed it.  There was mild small vessel disease (very few T2 hyperintensities).  PREVIOUS MEDICATIONS: primidone, 50 mg, 3 tablets daily (initially seemed to help); carbidopa/levodopa 10/100, 1 tablet 3 times per day  ALLERGIES:   Allergies  Allergen Reactions   Lisinopril Cough   Penicillins Itching    50 years ago   Adhesive [Tape] Itching   Atorvastatin Itching   Dilaudid [Hydromorphone Hcl] Itching   Hydromorphone Itching    CURRENT MEDICATIONS:  Current Outpatient Medications  Medication Instructions   acetaminophen (TYLENOL) 650 mg, Oral, Every 4 hours PRN   ALPRAZolam (XANAX) 0.5 MG tablet TAKE (1) TABLET TWICE DAILY AS NEEDED FOR ANXIETY.   aspirin 81 MG chewable tablet Oral, Daily   carbidopa-levodopa (SINEMET IR) 10-100 MG tablet TAKE ONE TABLET THREE TIMES DAILY   clindamycin (CLEOCIN) 150 MG capsule Take 600mg  1 hour prior to dental procedure   fexofenadine (ALLEGRA) 60 mg, Oral, 2 times daily PRN   latanoprost (XALATAN) 0.005 % ophthalmic solution INSTILL 1 DROP IN EACH EYE AT BEDTIME.   lithium carbonate 150 MG capsule TAKE 1 CAPSULE TWICE DAILY WITH MEALS.   losartan (COZAAR) 50 MG tablet TAKE 1 TABLET ONCE DAILY.    MAGNESIUM PO Oral   Multiple Vitamin (MULTIVITAMIN) capsule 1 capsule, Oral, Daily   omeprazole (PRILOSEC) 20 mg, Oral, 2 times daily    Objective:   VITALS:   Vitals:   07/06/20 0930  BP: 124/82  Pulse: 60  SpO2: 98%  Weight: 147 lb (66.7 kg)  Height: 5\' 2"  (1.575 m)    GEN:  The patient appears stated age and is in NAD. HEENT:  Normocephalic, atraumatic.  The mucous membranes are moist. The superficial temporal arteries are without ropiness or tenderness. CV:  RRR Lungs:  CTAB Neck/HEME:  There are no carotid bruits bilaterally.  The L ear slightly approximates the L shoulder and the head is just slight to the left.  There is significant trap hypertrophy, esp on the L  Neurological examination:  Orientation: The patient is alert and oriented x3.  Cranial nerves: There is good facial symmetry.  She does have facial hypomimia.  Extraocular muscles are intact. The visual fields are full to confrontational testing. The speech is fluent and clear. Soft palate rises symmetrically and there is no tongue deviation. Hearing is intact to conversational tone. Sensation: Sensation is intact to light touch throughout. Motor: Strength is 5/5 in the bilateral upper and lower extremities.   Shoulder shrug is equal and symmetric.  There is no pronator drift. Deep tendon reflexes: Deep tendon reflexes are 2+-3/4 at the bilateral biceps, triceps, brachioradialis, patella and achilles. Plantar responses are downgoing bilaterally.  Movement examination: Tone: There is nl tone in the bilateral upper extremities.  The tone in the lower extremities is nl.  Abnormal movements: min rest tremor is felt on the R with distraction.  Slight postural tremor bilaterally.  Slight intention tremor bilaterally. Coordination:  There is decremation with RAM's, mostly with foot taps on the left Gait and Station: The patient pushes off to arise.  She has a walker, but does not use it. The patient's stride length is  pretty good.    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 with mild decreased arm swing on the L I have reviewed and interpreted the following labs independently   Chemistry      Component Value Date/Time   NA 139 05/11/2020 0700   NA 139 01/26/2020 0000   NA 141 04/24/2012 0929  K 4.9 05/11/2020 0700   K 4.4 04/24/2012 0929   CL 107 05/11/2020 0700   CL 109 (H) 04/24/2012 0929   CO2 24 05/11/2020 0700   CO2 23 04/24/2012 0929   BUN 18 05/11/2020 0700   BUN 19 01/26/2020 0000   BUN 20.6 04/24/2012 0929   CREATININE 0.93 05/11/2020 0700   CREATININE 0.9 04/24/2012 0929   GLU 82 01/26/2020 0000      Component Value Date/Time   CALCIUM 10.3 05/11/2020 0700   CALCIUM 10.5 (H) 01/12/2020 2123   CALCIUM 10.0 04/24/2012 0929   ALKPHOS 81 01/26/2020 0000   ALKPHOS 75 04/24/2012 0929   AST 19 05/11/2020 0700   AST 20 04/24/2012 0929   ALT 13 05/11/2020 0700   ALT 12 04/24/2012 0929   BILITOT 0.5 05/11/2020 0700   BILITOT 0.36 04/24/2012 0929      Lab Results  Component Value Date   TSH 2.734 01/12/2020   Lab Results  Component Value Date   WBC 7.1 01/26/2020   HGB 11.7 (A) 01/26/2020   HCT 35 (A) 01/26/2020   MCV 100.5 (H) 01/13/2020   PLT 294 01/26/2020     Total time spent on today's visit was 65 minutes, including both face-to-face time and nonface-to-face time.  Time included that spent on review of records (prior notes available to me/labs/imaging if pertinent), discussing treatment and goals, answering patient's questions and coordinating care.  Cc:  Mast, Man X, NP

## 2020-07-05 ENCOUNTER — Ambulatory Visit: Payer: Medicare PPO | Admitting: Family Medicine

## 2020-07-06 ENCOUNTER — Telehealth: Payer: Self-pay | Admitting: Neurology

## 2020-07-06 ENCOUNTER — Telehealth: Payer: Self-pay

## 2020-07-06 ENCOUNTER — Encounter: Payer: Self-pay | Admitting: Neurology

## 2020-07-06 ENCOUNTER — Other Ambulatory Visit: Payer: Self-pay

## 2020-07-06 ENCOUNTER — Ambulatory Visit: Payer: Medicare PPO | Admitting: Neurology

## 2020-07-06 VITALS — BP 124/82 | HR 60 | Ht 62.0 in | Wt 147.0 lb

## 2020-07-06 DIAGNOSIS — M542 Cervicalgia: Secondary | ICD-10-CM | POA: Diagnosis not present

## 2020-07-06 DIAGNOSIS — R251 Tremor, unspecified: Secondary | ICD-10-CM | POA: Diagnosis not present

## 2020-07-06 DIAGNOSIS — G20 Parkinson's disease: Secondary | ICD-10-CM | POA: Diagnosis not present

## 2020-07-06 DIAGNOSIS — G243 Spasmodic torticollis: Secondary | ICD-10-CM

## 2020-07-06 MED ORDER — CARBIDOPA-LEVODOPA 25-100 MG PO TABS: 1.0000 | ORAL_TABLET | Freq: Three times a day (TID) | ORAL | 1 refills | Status: DC

## 2020-07-06 NOTE — Telephone Encounter (Signed)
  Tracey [name] (Key: [id number]) - [id number] Botox 100UNIT solution    Status: PA Request Created: June 23rd, 2022 Sent: June 23rd, 2022  [facility] Electronic   Waiting on reply

## 2020-07-06 NOTE — Patient Instructions (Signed)
STOP carbidopa/levodopa 10/100 START carbidopa/levodopa 25/100.  Then take 1 tablet three times daily at 7am/11am/4pm, at least 30 minutes before meals   As a reminder, carbidopa/levodopa can be taken at the same time as a carbohydrate, but we like to have you take your pill either 30 minutes before a protein source or 1 hour after as protein can interfere with carbidopa/levodopa absorption.

## 2020-07-06 NOTE — Telephone Encounter (Signed)
Patient needs a prior authorization started for Botox 300 units.

## 2020-07-07 ENCOUNTER — Other Ambulatory Visit: Payer: Self-pay

## 2020-07-07 DIAGNOSIS — R251 Tremor, unspecified: Secondary | ICD-10-CM

## 2020-07-07 DIAGNOSIS — G20 Parkinson's disease: Secondary | ICD-10-CM

## 2020-07-07 DIAGNOSIS — G243 Spasmodic torticollis: Secondary | ICD-10-CM

## 2020-07-07 NOTE — Addendum Note (Signed)
Addended by: Armen Pickup A on: 07/07/2020 11:55 AM   Modules accepted: Orders

## 2020-07-07 NOTE — Progress Notes (Signed)
Amb

## 2020-07-10 ENCOUNTER — Ambulatory Visit: Payer: Medicare PPO | Admitting: Family Medicine

## 2020-07-11 NOTE — Telephone Encounter (Signed)
Your request has been approved PA Case: [id number], Status: Approved, Coverage Starts on: 01/15/2020 12:00:00 AM, Coverage Ends on: 01/13/2021 12:00:00 AM. Questions? Contact [phone].

## 2020-07-12 NOTE — Telephone Encounter (Signed)
Called and sch pt for the botox on 08/18/20

## 2020-07-13 ENCOUNTER — Encounter: Payer: Self-pay | Admitting: *Deleted

## 2020-07-13 NOTE — Telephone Encounter (Signed)
Sent PA to scan into patient's chart

## 2020-07-13 NOTE — Progress Notes (Signed)
Called [facility] . They are now [facility]. They still service [employer] insurance patients. Set up delivery sometimes between July 6-20.  Gave the prescription to the pharmacist for 300 units Botox.  Called patient to let her know they will be calling to set up new account. She will need to give permission to the pharmacy to send [place] the Botox. And gave her the number (same as above) to call them if they don't call in the next few days. They will need to verify her insurance and PA before they call her.  She wanted a sooner appt with at least a 2 day notice (for transportation arrangements) if possible. I passed this info to [facility] and the patient knows, if possible, we will. She also knows the Botox needs to be here before any appointment is scheduled.

## 2020-07-18 ENCOUNTER — Other Ambulatory Visit: Payer: Self-pay | Admitting: Nurse Practitioner

## 2020-07-18 ENCOUNTER — Ambulatory Visit
Admission: RE | Admit: 2020-07-18 | Discharge: 2020-07-18 | Disposition: A | Discharge status: Home / Self Care | Payer: Medicare PPO | Source: Ambulatory Visit | Attending: Neurology | Admitting: Neurology

## 2020-07-18 ENCOUNTER — Other Ambulatory Visit: Payer: Self-pay

## 2020-07-18 DIAGNOSIS — M47812 Spondylosis without myelopathy or radiculopathy, cervical region: Secondary | ICD-10-CM | POA: Diagnosis not present

## 2020-07-18 DIAGNOSIS — R600 Localized edema: Secondary | ICD-10-CM | POA: Diagnosis not present

## 2020-07-26 ENCOUNTER — Other Ambulatory Visit: Payer: Self-pay | Admitting: Nurse Practitioner

## 2020-07-31 ENCOUNTER — Other Ambulatory Visit: Payer: Self-pay | Admitting: Internal Medicine

## 2020-07-31 ENCOUNTER — Other Ambulatory Visit: Payer: Self-pay | Admitting: Nurse Practitioner

## 2020-08-15 ENCOUNTER — Encounter (HOSPITAL_COMMUNITY)
Admission: RE | Admit: 2020-08-15 | Discharge: 2020-08-15 | Disposition: A | Discharge status: Home / Self Care | Payer: Medicare PPO | Source: Ambulatory Visit | Attending: Neurology | Admitting: Neurology

## 2020-08-15 ENCOUNTER — Other Ambulatory Visit: Payer: Self-pay

## 2020-08-15 ENCOUNTER — Ambulatory Visit (HOSPITAL_COMMUNITY)
Admission: RE | Admit: 2020-08-15 | Discharge: 2020-08-15 | Disposition: A | Discharge status: Home / Self Care | Payer: Medicare PPO | Source: Ambulatory Visit | Attending: Neurology | Admitting: Neurology

## 2020-08-15 DIAGNOSIS — G20 Parkinson's disease: Secondary | ICD-10-CM | POA: Diagnosis not present

## 2020-08-15 DIAGNOSIS — R251 Tremor, unspecified: Secondary | ICD-10-CM | POA: Insufficient documentation

## 2020-08-15 DIAGNOSIS — R269 Unspecified abnormalities of gait and mobility: Secondary | ICD-10-CM | POA: Insufficient documentation

## 2020-08-15 MED ORDER — POTASSIUM IODIDE (ANTIDOTE) 130 MG PO TABS
ORAL_TABLET | ORAL | Status: AC
  Filled 2020-08-15: qty 1

## 2020-08-15 MED ORDER — IOFLUPANE I 123 185 MBQ/2.5ML IV SOLN
5.0000 | Freq: Once | INTRAVENOUS | Status: AC | PRN
  Administered 2020-08-15: 5.3 via INTRAVENOUS
  Filled 2020-08-15: qty 5

## 2020-08-15 MED ORDER — POTASSIUM IODIDE (ANTIDOTE) 130 MG PO TABS
130.0000 mg | ORAL_TABLET | Freq: Once | ORAL | Status: AC
  Administered 2020-08-15: 130 mg via ORAL

## 2020-08-18 ENCOUNTER — Other Ambulatory Visit: Payer: Self-pay

## 2020-08-18 ENCOUNTER — Ambulatory Visit: Payer: Medicare PPO | Admitting: Neurology

## 2020-08-18 DIAGNOSIS — G243 Spasmodic torticollis: Secondary | ICD-10-CM | POA: Diagnosis not present

## 2020-08-18 MED ORDER — ONABOTULINUMTOXINA 100 UNITS IJ SOLR
300.0000 [IU] | Freq: Once | INTRAMUSCULAR | Status: AC
  Administered 2020-08-18: 280 [IU] via INTRAMUSCULAR

## 2020-08-18 NOTE — Telephone Encounter (Signed)
Tell patient:  It doesn't matter.  Just a mistake that radiology made when dictating.  We can ask them to correct (please call radiology).  Pt was given results of DaTscan during botox appt During botox appt, pt asked about sweating and nightmares.  Told her we would need to call her back as I needed to check her chart.  Tell her, after checking her chart, that I really think she needs to follow-up with her primary provider.  Clonazepam can sometimes help the nightmares, but she is already on alprazolam, and those drugs cannot be used together.  She can ask her primary provider about that, and perhaps they can change it.  The sweating will need to be something that they talk about as well.

## 2020-08-18 NOTE — Procedures (Signed)
[facility]   Procedure Note Botox  Attending: Dr. Wells Guiles Deneene Tarver  Preoperative Diagnosis(es): Cervical Dystonia  Result History  Onset of effect: n/a  Duration of Benefit: n/a  Adverse Effects: n/a   Consent obtained from: The patient Benefits discussed included, but were not limited to decreased muscle tightness, increased joint range of motion, and decreased pain.  Risk discussed included, but were not limited pain and discomfort, bleeding, bruising, excessive weakness, venous thrombosis, muscle atrophy and dysphagia.  A copy of the patient medication guide was given to the patient which explains the blackbox warning.  Patients identity and treatment sites confirmed Yes.  .  Details of Procedure: Skin was cleaned with alcohol.  A 30 gauge, 57m  needle was introduced to the target muscle, except for posterior splenius where 27 gauge, 1.5 inch needle used.   Prior to injection, the needle plunger was aspirated to make sure the needle was not within a blood vessel.  There was no blood retrieved on aspiration.    Following is a summary of the muscles injected  And the amount of Botulinum toxin used:   Dilution 0.9% preservative free saline mixed with 100 u Botox type A to make 10 U per 0.1cc  Injections  Location Left  Right Units Number of sites        Sternocleidomastoid 40  40 1  Splenius Capitus, posterior approach  70 70 1  Splenius Capitus, lateral approach  '30 30 1  '$ Levator Scapulae      Trapezius 20/20/20/20 20/20 120 6  L cervical paraspinal C5 '20  20 1  '$ TOTAL UNITS:   280     Agent: Botulinum Type A ( Onobotulinum Toxin type A ).  3 vials of Botox were used, each containing 100 units and freshly diluted with 1 mL of sterile, non-preserved saline   Total injected (Units):  280  Total wasted (Units): 20   Pt tolerated procedure well without complications.   Reinjection is anticipated in 3 months.

## 2020-08-28 ENCOUNTER — Other Ambulatory Visit: Payer: Self-pay | Admitting: Nurse Practitioner

## 2020-08-28 NOTE — Telephone Encounter (Signed)
Xanax last filled on 05/18/2020, no treatment agreement on file. I made a notation on next appointment to have patient sign a treatment agreement

## 2020-09-07 ENCOUNTER — Encounter: Payer: Self-pay | Admitting: Nurse Practitioner

## 2020-09-07 ENCOUNTER — Non-Acute Institutional Stay: Payer: Medicare PPO | Admitting: Nurse Practitioner

## 2020-09-07 ENCOUNTER — Other Ambulatory Visit: Payer: Self-pay

## 2020-09-07 DIAGNOSIS — M8949 Other hypertrophic osteoarthropathy, multiple sites: Secondary | ICD-10-CM | POA: Diagnosis not present

## 2020-09-07 DIAGNOSIS — R4189 Other symptoms and signs involving cognitive functions and awareness: Secondary | ICD-10-CM

## 2020-09-07 DIAGNOSIS — G243 Spasmodic torticollis: Secondary | ICD-10-CM | POA: Diagnosis not present

## 2020-09-07 DIAGNOSIS — K219 Gastro-esophageal reflux disease without esophagitis: Secondary | ICD-10-CM

## 2020-09-07 DIAGNOSIS — R001 Bradycardia, unspecified: Secondary | ICD-10-CM | POA: Diagnosis not present

## 2020-09-07 DIAGNOSIS — G20 Parkinson's disease: Secondary | ICD-10-CM | POA: Diagnosis not present

## 2020-09-07 DIAGNOSIS — F304 Manic episode in full remission: Secondary | ICD-10-CM

## 2020-09-07 DIAGNOSIS — I10 Essential (primary) hypertension: Secondary | ICD-10-CM

## 2020-09-07 DIAGNOSIS — C49A3 Gastrointestinal stromal tumor of small intestine: Secondary | ICD-10-CM

## 2020-09-07 DIAGNOSIS — M150 Primary generalized (osteo)arthritis: Secondary | ICD-10-CM

## 2020-09-07 DIAGNOSIS — R609 Edema, unspecified: Secondary | ICD-10-CM | POA: Diagnosis not present

## 2020-09-07 DIAGNOSIS — M159 Polyosteoarthritis, unspecified: Secondary | ICD-10-CM

## 2020-09-07 DIAGNOSIS — G20A1 Parkinson's disease without dyskinesia, without mention of fluctuations: Secondary | ICD-10-CM

## 2020-09-07 DIAGNOSIS — K5901 Slow transit constipation: Secondary | ICD-10-CM

## 2020-09-07 MED ORDER — DIVALPROEX SODIUM 125 MG PO DR TAB: 125.0000 mg | DELAYED_RELEASE_TABLET | Freq: Every day | ORAL | 1 refills | Status: DC

## 2020-09-07 NOTE — Assessment & Plan Note (Addendum)
Parkinson's, cervical degenerative changes, CT/MRI 12/2019 ruled out acute process, aches in neck R>L travels to the back of head, comes and goes, not disabling, f/u Neurosurgery. Failed Cymbalta, Gabapentin, Robaxin, Ultracet  Hx of Parkinson's, stable(diplopia R+L lateral,  tremor in fingers much improved), on Sinemet f/u Neurology.

## 2020-09-07 NOTE — Assessment & Plan Note (Signed)
Cognitive impairment: 12/2019 MRI brain showed chronic microvascular ischemic changes, no acute disease with mild cortical atrophy

## 2020-09-07 NOTE — Assessment & Plan Note (Signed)
improved from 40s, Hx of normalized HR after Tylenol PM dc'd, GDR of Lithium helped too.

## 2020-09-07 NOTE — Progress Notes (Signed)
Location:   clinic [facility]   Place of Service:  Clinic (12) Provider: Marlana Latus NP  Code Status: DNR Goals of Care: [place] 09/07/2020  Does Patient Have a Medical Advance Directive? Yes  Type of Advance Directive [place]  Does patient want to make changes to medical advance directive? No - Patient declined  Copy of [place] in Chart? Yes - validated most recent copy scanned in chart (See row information)  Would patient like information on creating a medical advance directive? -  Pre-existing out of facility DNR order (yellow form or pink MOST form) -     Chief Complaint  Patient presents with   Medical Management of Chronic Issues    3 month follow up    Health Maintenance    Discuss need for hep c screening and influenza vaccine     HPI: Patient is a 80 y.o. female seen today for medical management of chronic diseases.    Tinnitus, underwent eval of  ENT, suggested music background.              Hx of GIST of small intestine s/p resection. prn MiraLax. Daily Psyllium .             Constipation. Prn Metamucil, MiraLax qd are effective.              Hx of Parkinson's, stable(diplopia R+L lateral,  tremor in fingers much improved), on Sinemet f/u Neurology.              GERD, stable, on Omeprazole '20mg'$  bid, Hgb 11.7 01/26/20             HTN, blood pressure is controlled on Losartan, Bun/creat 18/0.93 05/11/20             Bipolar disorder, stable, on Lithium, GDR due to bradycardia, Alprazolam 0.'5MG'$  BID prn. TSH 2.734 01/12/20. May try Depakote or Psych consult.              Bradycardia, improved from 40s, Hx of normalized HR after Tylenol PM dc'd, GDR of Lithium helped too.              Hypercalcemia, f/u endocrinology, Ca 10.3 05/11/20, PTH 79 01/12/20. GDR of Lithium may help             BLE trace edema              OA, takes  Tylenol, s/p R knee arthroplasty.              Cervical dystonia, s/p  inj Botox inj, no change.   Parkinson's, cervical degenerative changes, CT/MRI 12/2019 ruled out acute process, aches in neck R>L travels to the back of head, comes and goes, not disabling, f/u Neurosurgery. Failed Cymbalta, Gabapentin, Robaxin, Ultracet             Cognitive impairment: 12/2019 MRI brain showed chronic microvascular ischemic changes, no acute disease with mild cortical atrophy     Past Medical History:  Diagnosis Date   Anxiety    Arthritis    Cardiac conduction disorder 03/30/2012   Overview:  STORY: ETT 03/09/2012 Echo 03/07/2012 normal Dr Einar Gip, bradycardia felt due to glaucoma eye drops   Diverticulosis of colon    GERD (gastroesophageal reflux disease)    GIST (gastrointestinal stroma tumor), malignant, colon ([place])    Heart murmur    History of colon polyps 10/24/2008   Hypertension    Major neurocognitive disorder due to  Parkinson's disease, possible    Tremors possible parkinsons   Manic disorder, single episode, in full remission ([place]) 12/01/2009   Sixth nerve palsy     Past Surgical History:  Procedure Laterality Date   BILATERAL SALPINGOOPHORECTOMY  09/22/2007   CATARACT EXTRACTION Bilateral    Gastrointestinal Stroma Tumor,  Other  1960   GIST Surgery   ILEOCECETOMY  09/22/2007   OVARIAN CYST REMOVAL Right 1967   SMALL INTESTINE SURGERY  09/22/2007   TOTAL KNEE ARTHROPLASTY Right 10/05/2019   Procedure: RIGHT TOTAL KNEE ARTHROPLASTY;  Surgeon: Meredith Pel, MD;  Location: [facility];  Service: Orthopedics;  Laterality: Right;   TOTAL VAGINAL HYSTERECTOMY  1986   Fibroids    Allergies  Allergen Reactions   Lisinopril Cough   Penicillins Itching    50 years ago   Adhesive [Tape] Itching   Atorvastatin Itching   Dilaudid [Hydromorphone Hcl] Itching   Hydromorphone Itching    Allergies as of 09/07/2020       Reactions   Lisinopril Cough   Penicillins Itching   50 years ago   Adhesive [tape] Itching   Atorvastatin Itching   Dilaudid [hydromorphone Hcl] Itching    Hydromorphone Itching        Medication List        Accurate as of September 07, 2020 11:59 PM. If you have any questions, ask your nurse or doctor.          acetaminophen 325 MG tablet Commonly known as: TYLENOL Take 650 mg by mouth every 4 (four) hours as needed.   ALPRAZolam 0.5 MG tablet Commonly known as: XANAX TAKE (1) TABLET TWICE DAILY AS NEEDED FOR ANXIETY.   aspirin 81 MG chewable tablet Chew by mouth daily.   carbidopa-levodopa 25-100 MG tablet Commonly known as: SINEMET IR Take 1 tablet by mouth 3 (three) times daily. 7am/11am/4pm   clindamycin 150 MG capsule Commonly known as: CLEOCIN Take '600mg'$  1 hour prior to dental procedure   divalproex 125 MG DR tablet Commonly known as: Depakote Take 1 tablet (125 mg total) by mouth daily. Started by: Marybelle Giraldo X Tisheena Maguire, NP   fexofenadine 60 MG tablet Commonly known as: ALLEGRA Take 60 mg by mouth 2 (two) times daily as needed.   latanoprost 0.005 % ophthalmic solution Commonly known as: XALATAN INSTILL 1 DROP IN EACH EYE AT BEDTIME.   lithium carbonate 150 MG capsule TAKE 1 CAPSULE TWICE DAILY WITH MEALS.   losartan 50 MG tablet Commonly known as: COZAAR TAKE ONE TABLET BY MOUTH ONCE DAILY   MAGNESIUM PO Take by mouth.   multivitamin capsule Take 1 capsule by mouth daily.   omeprazole 20 MG capsule Commonly known as: PRILOSEC TAKE (1) CAPSULE TWICE DAILY.        Review of Systems:  Review of Systems  Constitutional:  Negative for appetite change, fatigue and fever.  HENT:  Positive for hearing loss and tinnitus. Negative for congestion and trouble swallowing.   Eyes:  Negative for visual disturbance.       Glaucoma, diplopia R+L lateral peripheral visual fields only.   Respiratory:  Negative for shortness of breath.        Occasionally DOE, hacking cough  Cardiovascular:  Negative for leg swelling.  Gastrointestinal:  Negative for abdominal pain and constipation.  Genitourinary:  Positive for  frequency. Negative for dysuria and urgency.       Couple of times at night, no difficulty of returning asleep.   Musculoskeletal:  Positive for arthralgias and  gait problem.       Walker.   Skin:  Negative for color change.  Neurological:  Positive for headaches. Negative for tremors, speech difficulty and light-headedness.       Tremors in hands/fingers at rest. L>R, near resolved,  comes and goes nature of the neck/occipital pain. Stiff neck. Pain the patent right knee, s/p TKR. Tremor is better since Sinemet.   Psychiatric/Behavioral:  Positive for sleep disturbance. Negative for confusion. The patient is nervous/anxious.        Relapsed mood, feels anxious or depressive mood.    Health Maintenance  Topic Date Due   Hepatitis C Screening  Never done   COVID-19 Vaccine (4 - Booster for Moderna series) 02/23/2020   INFLUENZA VACCINE  08/14/2020   TETANUS/TDAP  08/19/2021   DEXA SCAN  Completed   PNA vac Low Risk Adult  Completed   Zoster Vaccines- Shingrix  Completed   HPV VACCINES  Aged Out    Physical Exam: Vitals:   09/07/20 1553  BP: 118/70  Pulse: 62  Resp: 18  Temp: (!) 97.1 F (36.2 C)  SpO2: 98%  Weight: 150 lb 9.6 oz (68.3 kg)  Height: '5\' 2"'$  (1.575 m)   Body mass index is 27.55 kg/m. Physical Exam Vitals and nursing note reviewed.  Constitutional:      Appearance: Normal appearance.  HENT:     Head: Normocephalic and atraumatic.     Mouth/Throat:     Mouth: Mucous membranes are moist.  Eyes:     Extraocular Movements: Extraocular movements intact.     Conjunctiva/sclera: Conjunctivae normal.     Pupils: Pupils are equal, round, and reactive to light.  Neck:     Comments: Stiff neck Cardiovascular:     Rate and Rhythm: Normal rate and regular rhythm.     Heart sounds: Murmur heard.     Comments: HR in 62 bpm Pulmonary:     Breath sounds: No rales.  Abdominal:     General: Bowel sounds are normal.     Palpations: Abdomen is soft.     Tenderness:  There is no abdominal tenderness.  Musculoskeletal:        General: Tenderness present.     Cervical back: Normal range of motion.     Right lower leg: No edema.     Left lower leg: No edema.     Comments: Chronic R knee pain is improved. S/p TKR right. Stiff neck, comes and goes neck/occipital pain.   Skin:    General: Skin is warm and dry.  Neurological:     General: No focal deficit present.     Mental Status: She is alert and oriented to person, place, and time. Mental status is at baseline.     Motor: No weakness.     Coordination: Coordination abnormal.     Gait: Gait abnormal.     Comments: Resting tremor in fingers, near resolution.   Psychiatric:        Mood and Affect: Mood normal.        Behavior: Behavior normal.        Thought Content: Thought content normal.        Judgment: Judgment normal.    Labs reviewed: Basic Metabolic Panel: Recent Labs    01/12/20 1610 01/12/20 2122 01/12/20 2123 01/13/20 0519 01/26/20 0000 05/11/20 0700  NA 141  --   --  143 139 139  K 4.9  --   --  3.9 5.1 4.9  CL  112*  --   --  115* 109* 107  CO2 22  --   --  22 25* 24  GLUCOSE 105*  --   --  102*  --  92  BUN 18  --   --  '20 19 18  '$ CREATININE 1.13*  --   --  0.98 0.9 0.93  CALCIUM 10.8*  --    < > 9.8 10.0 10.3  TSH  --  2.734  --   --   --   --    < > = values in this interval not displayed.   Liver Function Tests: Recent Labs    01/12/20 1610 01/26/20 0000 05/11/20 0700  AST 43* 11* 19  ALT '23 12 13  '$ ALKPHOS 85 81  --   BILITOT 0.8  --  0.5  PROT 7.2  --  6.5  ALBUMIN 4.2 3.5  --    No results for input(s): LIPASE, AMYLASE in the last 8760 hours. No results for input(s): AMMONIA in the last 8760 hours. CBC: Recent Labs    10/01/19 1139 01/12/20 1610 01/13/20 0519 01/26/20 0000  WBC 8.7 10.2 8.3 7.1  NEUTROABS  --   --   --  4,828.00  HGB 12.5 13.1 12.2 11.7*  HCT 41.1 42.2 38.9 35*  MCV 102.0* 100.7* 100.5*  --   PLT 324 326 272 294   Lipid  Panel: No results for input(s): CHOL, HDL, LDLCALC, TRIG, CHOLHDL, LDLDIRECT in the last 8760 hours. No results found for: HGBA1C  Procedures since last visit: [place] LOC INFLAM SPECT 1 DAY  Result Date: 08/18/2020 CLINICAL DATA:  80 year old female with tremors, full body shaking, gait changes. EXAM: NUCLEAR MEDICINE BRAIN IMAGING WITH SPECT  (DaTscan ) TECHNIQUE: SPECT images of the brain were obtained after intravenous injection of radiopharmaceutical. 4 hour post injection imaging. Appropriate positioning. 130 mg i-STAT given orally for thyroid blockade. RADIOPHARMACEUTICALS:  5.3 millicuries I [id number] Ioflupane COMPARISON:  Brain MRI 01/13/2020 FINDINGS: There is mild asymmetry of intensity within the striatum with the LEFT striatal intensity greater than the RIGHT striatum. The striatum are normal "comma" shape with no clear loss of activity within the heads of the caudate nuclei or putamen. IMPRESSION: Mild asymmetry of intensity within the striatum as described above is favored within normal limits. Of note, DaTSCAN is not diagnostic of Parkinsonian syndromes, which remains a clinical diagnosis. DaTscan is an adjuvant test to aid in the clinical diagnosis of Parkinsonian syndromes. Electronically Signed   By: Suzy Bouchard M.D.   On: 08/18/2020 08:08    Assessment/Plan  Arthritis, degenerative takes  Tylenol, s/p R knee arthroplasty.   Cervical dystonia Cervical dystonia, s/p  inj Botox inj, little efficacy so far.   Parkinson disease ([place])  Parkinson's, cervical degenerative changes, CT/MRI 12/2019 ruled out acute process, aches in neck R>L travels to the back of head, comes and goes, not disabling, f/u Neurosurgery. Failed Cymbalta, Gabapentin, Robaxin, Ultracet  Hx of Parkinson's, stable(diplopia R+L lateral,  tremor in fingers much improved), on Sinemet f/u Neurology.   Cognitive decline Cognitive impairment: 12/2019 MRI brain showed chronic microvascular ischemic  changes, no acute disease with mild cortical atrophy  Edema Trace BLE  Hypercalcemia f/u endocrinology, Ca 10.3 05/11/20, PTH 79 01/12/20. GDR of Lithium may help  Bradycardia improved from 40s, Hx of normalized HR after Tylenol PM dc'd, GDR of Lithium helped too.   Bipolar I disorder, single manic episode, in full remission [facility]) The patient said sometime  she is manic,  on Lithium, GDR due to bradycardia, Alprazolam 0.'5MG'$  BID prn. TSH 2.734 01/12/20. The patient desires to try Depakote '125mg'$  qd, delay  Psych consult.   Essential (primary) hypertension blood pressure is controlled on Losartan, Bun/creat 18/0.93 05/11/20  GERD (gastroesophageal reflux disease) stable, on Omeprazole '20mg'$  bid, Hgb 11.7 01/26/20  Slow transit constipation Prn Metamucil, MiraLax qd are effective.   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 prn MiraLax. Daily Psyllium .   Labs/tests ordered:  none  Next appt:  4 months.

## 2020-09-07 NOTE — Assessment & Plan Note (Addendum)
Cervical dystonia, s/p  inj Botox inj, little efficacy so far.

## 2020-09-07 NOTE — Assessment & Plan Note (Signed)
stable, on Omeprazole '20mg'$  bid, Hgb 11.7 01/26/20

## 2020-09-07 NOTE — Assessment & Plan Note (Signed)
Prn Metamucil, MiraLax qd are effective.  

## 2020-09-07 NOTE — Assessment & Plan Note (Signed)
Trace BLE 

## 2020-09-07 NOTE — Assessment & Plan Note (Signed)
takes  Tylenol, s/p R knee arthroplasty.  

## 2020-09-07 NOTE — Assessment & Plan Note (Signed)
f/u endocrinology, Ca 10.3 05/11/20, PTH 79 01/12/20. GDR of Lithium may help

## 2020-09-07 NOTE — Assessment & Plan Note (Addendum)
The patient said sometime she is manic,  on Lithium, GDR due to bradycardia, Alprazolam 0.'5MG'$  BID prn. TSH 2.734 01/12/20. The patient desires to try Depakote '125mg'$  qd, delay  Psych consult.

## 2020-09-07 NOTE — Assessment & Plan Note (Signed)
Hx of GIST of small intestine s/p resection. prn MiraLax. Daily Psyllium . 

## 2020-09-07 NOTE — Assessment & Plan Note (Signed)
blood pressure is controlled on Losartan, Bun/creat 18/0.93 05/11/20

## 2020-09-08 ENCOUNTER — Encounter: Payer: Self-pay | Admitting: Nurse Practitioner

## 2020-09-14 ENCOUNTER — Ambulatory Visit: Payer: Medicare PPO | Admitting: Nurse Practitioner

## 2020-09-14 ENCOUNTER — Encounter: Payer: Self-pay | Admitting: Nurse Practitioner

## 2020-09-14 ENCOUNTER — Other Ambulatory Visit: Payer: Self-pay

## 2020-09-14 VITALS — BP 130/80 | HR 60 | Temp 96.8°F | Resp 18 | Ht 62.0 in | Wt 152.2 lb

## 2020-09-14 DIAGNOSIS — Z0000 Encounter for general adult medical examination without abnormal findings: Secondary | ICD-10-CM

## 2020-09-14 NOTE — Progress Notes (Signed)
Subjective:   Tracey [name] is a 80 y.o. female who presents for Medicare Annual (Subsequent) preventive examination in clinic @ [facility]    Objective:    Today's Vitals   09/14/20 1615 09/14/20 1621  BP: 130/80   Pulse: 60   Resp: 18   Temp: (!) 96.8 F (36 C)   SpO2: 97%   Weight: 152 lb 3.2 oz (69 kg)   Height: '5\' 2"'$  (1.575 m)   PainSc:  5    Body mass index is 27.84 kg/m.  Advanced Directives 09/14/2020 09/07/2020 07/06/2020 06/08/2020 05/18/2020 05/04/2020 02/02/2020  Does Patient Have a Medical Advance Directive? Yes Yes Yes Yes Yes Yes Yes  Type of Industrial/product designer of [employer] Power of [place];Living will;Out of facility DNR (pink MOST or yellow form) Healthcare Power of [place]  Does patient want to make changes to medical advance directive? No - Patient declined No - Patient declined - No - Patient declined No - Patient declined No - Patient declined No - Patient declined  Copy of [place] in Chart? Yes - validated most recent copy scanned in chart (See row information) Yes - validated most recent copy scanned in chart (See row information) - Yes - validated most recent copy scanned in chart (See row information) Yes - validated most recent copy scanned in chart (See row information) Yes - validated most recent copy scanned in chart (See row information) Yes - validated most recent copy scanned in chart (See row information)  Would patient like information on creating a medical advance directive? - - - - - - -  Pre-existing out of facility DNR order (yellow form or pink MOST form) - - - - - - -    Current Medications (verified) Outpatient Encounter Medications as of 09/14/2020  Medication Sig   acetaminophen (TYLENOL) 325 MG tablet Take 650 mg by mouth every 4 (four) hours as needed.   ALPRAZolam (XANAX) 0.5  MG tablet TAKE (1) TABLET TWICE DAILY AS NEEDED FOR ANXIETY.   aspirin 81 MG chewable tablet Chew by mouth daily.   carbidopa-levodopa (SINEMET IR) 25-100 MG tablet Take 1 tablet by mouth 3 (three) times daily. 7am/11am/4pm   clindamycin (CLEOCIN) 150 MG capsule Take '600mg'$  1 hour prior to dental procedure   divalproex (DEPAKOTE) 125 MG DR tablet Take 1 tablet (125 mg total) by mouth daily.   fexofenadine (ALLEGRA) 60 MG tablet Take 60 mg by mouth 2 (two) times daily as needed.   latanoprost (XALATAN) 0.005 % ophthalmic solution INSTILL 1 DROP IN EACH EYE AT BEDTIME.   lithium carbonate 150 MG capsule TAKE 1 CAPSULE TWICE DAILY WITH MEALS.   losartan (COZAAR) 50 MG tablet TAKE ONE TABLET BY MOUTH ONCE DAILY   MAGNESIUM PO Take by mouth.   Multiple Vitamin (MULTIVITAMIN) capsule Take 1 capsule by mouth daily.   omeprazole (PRILOSEC) 20 MG capsule TAKE (1) CAPSULE TWICE DAILY.   No facility-administered encounter medications on file as of 09/14/2020.    Allergies (verified) Lisinopril, Penicillins, Adhesive [tape], Atorvastatin, Dilaudid [hydromorphone hcl], and Hydromorphone   History: Past Medical History:  Diagnosis Date   Anxiety    Arthritis    Cardiac conduction disorder 03/30/2012   Overview:  STORY: ETT 03/09/2012 Echo 03/07/2012 normal Dr Einar Gip, bradycardia felt due to glaucoma eye drops   Diverticulosis of colon    GERD (gastroesophageal reflux disease)  GIST (gastrointestinal stroma tumor), malignant, colon ([place])    Heart murmur    History of colon polyps 10/24/2008   Hypertension    Major neurocognitive disorder due to Parkinson's disease, possible    Tremors possible parkinsons   Manic disorder, single episode, in full remission ([place]) 12/01/2009   Sixth nerve palsy    Past Surgical History:  Procedure Laterality Date   BILATERAL SALPINGOOPHORECTOMY  09/22/2007   CATARACT EXTRACTION Bilateral    Gastrointestinal Stroma Tumor,  Other  1960   GIST Surgery    ILEOCECETOMY  09/22/2007   OVARIAN CYST REMOVAL Right 1967   SMALL INTESTINE SURGERY  09/22/2007   TOTAL KNEE ARTHROPLASTY Right 10/05/2019   Procedure: RIGHT TOTAL KNEE ARTHROPLASTY;  Surgeon: Meredith Pel, MD;  Location: [facility];  Service: Orthopedics;  Laterality: Right;   TOTAL VAGINAL HYSTERECTOMY  1986   Fibroids   Family History  Problem Relation Age of Onset   Congestive Heart Failure Mother    Dementia Mother    Heart disease Father    Diabetes Father    Lung cancer Son    Social History   Socioeconomic History   Marital status: Widowed    Spouse name: Not on file   Number of children: 2   Years of education: Bachelors   Highest education level: Not on file  Occupational History   Occupation: retired    Comment: made wedding dressings  Tobacco Use   Smoking status: Never   Smokeless tobacco: Never  Vaping Use   Vaping Use: Never used  Substance and Sexual Activity   Alcohol use: No   Drug use: No   Sexual activity: Not Currently    Birth control/protection: Post-menopausal  Other Topics Concern   Not on file  Social History Narrative   Lives at home alone.   Right-handed.   No caffeine use.      Diet: Eats anything, like fruits, vegetables, regular diet      Do you drink/ eat things with caffeine? No      Marital status:  Widowed                             What year were you married ? 1965      Do you live in a house, apartment,assistred living, condo, trailer, etc.)? Apartment      Is it one or more stories?  One Story      How many persons live in your home ? 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  No                            Type & how often       ADVANCED DIRECTIVES (Please bring copies)      Do you have a living will? Yes      Do you have a DNR form? Yes                       If not, do you want to discuss one?       Do you have signed POA?HPOA forms?  Yes  If so, please bring to your appointment      FUNCTIONAL STATUS- To be completed by Spouse / child / Staff       Do you have difficulty bathing or dressing yourself ? No   Do you have difficulty preparing food or eating ? No      Do you have difficulty managing your mediation ? No      Do you have difficulty managing your finances ? No      Do you have difficulty affording your medication ? No      Social Determinants of Radio broadcast assistant Strain: Not on file  Food Insecurity: Not on file  Transportation Needs: Not on file  Physical Activity: Not on file  Stress: Not on file  Social Connections: Not on file    Tobacco Counseling Counseling given: Not Answered   Clinical Intake:  Pre-visit preparation completed: Yes  Pain : 0-10 Pain Score: 5  Pain Type: Chronic pain, Neuropathic pain Pain Location: Neck Pain Orientation: Mid Pain Radiating Towards: back of head Pain Descriptors / Indicators: Burning, Constant, Nagging, Tingling Pain Onset: More than a month ago Pain Frequency: Constant Pain Relieving Factors: none so far, saw nuerology Effect of Pain on Daily Activities: none  Pain Relieving Factors: none so far, saw nuerology  BMI - recorded: 27.84 Nutritional Status: BMI 25 -29 Overweight Nutritional Risks: None Diabetes: No  How often do you need to have someone help you when you read instructions, pamphlets, or other written materials from your doctor or pharmacy?: 1 - Never What is the last grade level you completed in school?: college  Diabetic?no  Interpreter Needed?: No  Information entered by :: Donis Kotowski Bretta Bang   Activities of Daily Living In your present state of health, do you have any difficulty performing the following activities: 01/13/2020 10/01/2019  Hearing? N N  Vision? Y N  Difficulty concentrating or making decisions? Y Y   Comment - " getting a little slow"  Walking or climbing stairs? Y Y  Comment secondary to weakness and tremors -  Dressing or bathing? Y N  Doing errands, shopping? Y Y  Comment - not driving  Some recent data might be hidden    Patient Care Team: Veora Fonte X, NP as PCP - General (Internal Medicine) Marti Sleigh, MD as Consulting Physician (Gynecologic Oncology) Ladell Pier, MD as Consulting Physician (Oncology) Richmond Campbell, MD as Consulting Physician (Gastroenterology) Clent Jacks, MD as Consulting Physician (Ophthalmology) Sabra Heck Precious Haws, MD as Referring Physician (Neurology) Tat, Eustace Quail, DO as Consulting Physician (Neurology)  Indicate any recent Medical Services you may have received from other than [facility] providers in the past year (date may be approximate).     Assessment:   This is a routine wellness examination for Tracey [name].  Hearing/Vision screen No results found.  Dietary issues and exercise activities discussed:     Goals Addressed             This Visit's Progress    Functional Decline Minimized       Evidence-based guidance:  Assess fall risk, including balance and gait impairment, muscle weakness, diminished vision or hearing, environmental hazards, effects of medication and dehydration.  Assess and review gait speed, decreased muscle strength or impaired functional mobility; consider use of validated tool if available.  Prepare patient for rehabilitation services to develop an individualized activity and exercise program as indicated, such as progressive resistance strengthening, balance and activity tolerance training or home exercise  program.  Prevent falls with environmental adjustment; encourage compliance with exercise program, management of incontinence and adequate vitamin D and calcium intake from food or supplements.  Promote gradual increase in intensity of activity and exercise as tolerated, such as duration, frequency,  exercise repetition and sets and intensity.  Provide guidance and interventions aimed at fall prevention.  Review or assess presence of reduced muscle mass or results of dual-energy x-ray absorptiometry.   Notes:        Depression Screen PHQ 2/9 Scores 09/14/2020 05/04/2020  PHQ - 2 Score 0 0    Fall Risk Fall Risk  09/14/2020 07/06/2020 06/08/2020 05/18/2020 05/04/2020  Falls in the past year? 0 0 0 0 0  Number falls in past yr: 0 0 0 0 0  Injury with Fall? 0 0 0 0 0  Risk for fall due to : No Fall Risks - - - -  Follow up Falls evaluation completed - - - -    FALL RISK PREVENTION PERTAINING TO THE HOME:  Any stairs in or around the home? Yes  If so, are there any without handrails? No  Home free of loose throw rugs in walkways, pet beds, electrical cords, etc? Yes  Adequate lighting in your home to reduce risk of falls? Yes   ASSISTIVE DEVICES UTILIZED TO PREVENT FALLS:  Life alert? No  Use of a cane, walker or w/c? No  Grab bars in the bathroom? No  Shower chair or bench in shower? Yes  Elevated toilet seat or a handicapped toilet? Yes   TIMED UP AND GO:  Was the test performed? No .  Length of time to ambulate 10 feet: 10 sec.   Gait steady and fast without use of assistive device  Cognitive Function: MMSE - Mini Mental State Exam 09/14/2020  Orientation to time 5  Orientation to Place 5  Registration 3  Attention/ Calculation 5  Recall 3  Language- name 2 objects 2  Language- repeat 1  Language- follow 3 step command 3  Language- read & follow direction 1  Write a sentence 1  Copy design 1  Total score 30        Immunizations Immunization History  Administered Date(s) Administered   Influenza Split 10/24/2008, 09/26/2009, 10/08/2011, 10/05/2012   Influenza, High Dose Seasonal PF 11/02/2013, 11/02/2013, 11/03/2017, 10/26/2018, 10/27/2019   Influenza,inj,Quad PF,6+ Mos 11/01/2014, 10/11/2015, 10/11/2016   Influenza-Unspecified 11/02/2013, 10/11/2016    Moderna Sars-Covid-2 Vaccination 01/18/2019, 02/15/2019, 11/23/2019   Pneumococcal Conjugate-13 08/20/2011   Pneumococcal Polysaccharide-23 01/14/2005, 06/29/2014   Pneumococcal-Unspecified 01/14/2005, 06/29/2014   Tdap 08/20/2011   Zoster Recombinat (Shingrix) 11/09/2018, 12/01/2018, 03/01/2019   Zoster, Live 10/08/2011    TDAP status: Up to date  Flu Vaccine status: Up to date  Pneumococcal vaccine status: Up to date  Covid-19 vaccine status: Completed vaccines  Qualifies for Shingles Vaccine? Yes   Zostavax completed Yes   Shingrix Completed?: Yes  Screening Tests Health Maintenance  Topic Date Due   Hepatitis C Screening  Never done   COVID-19 Vaccine (4 - Booster for Moderna series) 02/23/2020   INFLUENZA VACCINE  08/14/2020   TETANUS/TDAP  08/19/2021   DEXA SCAN  Completed   PNA vac Low Risk Adult  Completed   Zoster Vaccines- Shingrix  Completed   HPV VACCINES  Aged Out    Health Maintenance  Health Maintenance Due  Topic Date Due   Hepatitis C Screening  Never done   COVID-19 Vaccine (4 - Booster for Moderna series) 02/23/2020  INFLUENZA VACCINE  08/14/2020    Colorectal cancer screening: No longer required.   Self arrange Mammogram  DEXA self  Lung Cancer Screening: (Low Dose CT Chest recommended if Age 43-80 years, 30 pack-year currently smoking OR have quit w/in 15years.) does not qualify.   Lung Cancer Screening Referral: noe  Additional Screening:  Hepatitis C Screening: does not qualify; Completed   Vision Screening: Recommended annual ophthalmology exams for early detection of glaucoma and other disorders of the eye. Is the patient up to date with their annual eye exam?  Yes  Who is the provider or what is the name of the office in which the patient attends annual eye exams? Dr Bing Plume If pt is not established with a provider, would they like to be referred to a provider to establish care? No .   Dental Screening: Recommended annual dental  exams for proper oral hygiene  Community Resource Referral / Chronic Care Management: CRR required this visit?  No   CCM required this visit?  No      Plan:     I have personally reviewed and noted the following in the patient's chart:   Medical and social history Use of alcohol, tobacco or illicit drugs  Current medications and supplements including opioid prescriptions.  Functional ability and status Nutritional status Physical activity Advanced directives List of other physicians Hospitalizations, surgeries, and ER visits in previous 12 months Vitals Screenings to include cognitive, depression, and falls Referrals and appointments  In addition, I have reviewed and discussed with patient certain preventive protocols, quality metrics, and best practice recommendations. A written personalized care plan for preventive services as well as general preventive health recommendations were provided to patient.     Keiara Sneeringer X Erikson Danzy, NP   09/14/2020

## 2020-09-17 ENCOUNTER — Other Ambulatory Visit: Payer: Self-pay | Admitting: Nurse Practitioner

## 2020-09-27 ENCOUNTER — Ambulatory Visit: Payer: Medicare PPO | Admitting: Neurology

## 2020-10-03 DIAGNOSIS — H401121 Primary open-angle glaucoma, left eye, mild stage: Secondary | ICD-10-CM | POA: Diagnosis not present

## 2020-10-03 DIAGNOSIS — H43393 Other vitreous opacities, bilateral: Secondary | ICD-10-CM | POA: Diagnosis not present

## 2020-10-03 DIAGNOSIS — H401113 Primary open-angle glaucoma, right eye, severe stage: Secondary | ICD-10-CM | POA: Diagnosis not present

## 2020-10-03 DIAGNOSIS — H4922 Sixth [abducent] nerve palsy, left eye: Secondary | ICD-10-CM | POA: Diagnosis not present

## 2020-10-04 ENCOUNTER — Other Ambulatory Visit: Payer: Self-pay | Admitting: Orthopedic Surgery

## 2020-10-04 ENCOUNTER — Telehealth: Payer: Self-pay | Admitting: *Deleted

## 2020-10-04 ENCOUNTER — Other Ambulatory Visit: Payer: Self-pay | Admitting: *Deleted

## 2020-10-04 DIAGNOSIS — R519 Headache, unspecified: Secondary | ICD-10-CM

## 2020-10-04 NOTE — Telephone Encounter (Signed)
Orders placed for sed rate and CRP. ESR and sed rate same thing. Please print orders and fax to [facility] nurse at [phone].

## 2020-10-04 NOTE — Telephone Encounter (Signed)
Orders Faxed to [facility].

## 2020-10-04 NOTE — Telephone Encounter (Signed)
Kendrick Fries with [facility], Dr. Marigene Ehlers office called and stated that they are seeing patent for Headaches and Double Vision. (Will fax recent OV notes to our office)  Dr. Jerline Pain is wanting to Rule Out Giant Cell Arteritis and wants to now if we could do bloodwork on patient.   Requesting ESR, Sed Rate, CRP  If so, could you place an order for this to be done at [facility].  And fax the results to Dr. Jerline Pain at Fax: [phone]  Please Advise. (Forwarded to Amy due to [facility] out of office)

## 2020-10-09 DIAGNOSIS — R519 Headache, unspecified: Secondary | ICD-10-CM | POA: Diagnosis not present

## 2020-10-10 ENCOUNTER — Other Ambulatory Visit: Payer: Self-pay

## 2020-10-10 ENCOUNTER — Other Ambulatory Visit: Payer: Self-pay | Admitting: Nurse Practitioner

## 2020-10-10 ENCOUNTER — Other Ambulatory Visit: Payer: Medicare PPO

## 2020-10-10 NOTE — Telephone Encounter (Signed)
Patient has request refill on medication "Xanax". Patient last refill on medication was 08/29/2020. Medication pend and sent to Windell Moulding, NP. Due to PCP Mast, Man X, NP being out of office. Please Advise.

## 2020-10-11 LAB — SEDIMENTATION RATE: Sed Rate: 9 mm/h (ref 0–30)

## 2020-10-11 LAB — C-REACTIVE PROTEIN: CRP: 2.7 mg/L (ref <8.0)

## 2020-10-19 NOTE — Progress Notes (Signed)
Assessment/Plan:   1.  Parkinsonism  -DaTscan equivocal and felt to be essentially unremarkable per radiology.  They did mention mild asymmetry of intensity within the striate M with the right having less intensity than the left.  Her symptoms are mostly left-sided.  -Discussed with patient doing a levodopa challenge test.  I think that this would offer some clarity, as I agree that she has some parkinsonian symptoms.  She declined that, as she really thinks that levodopa has helped markedly.  She would like to stay on carbidopa/levodopa 25/100, 1 tablet 3 times per day.  -Patient was started on levodopa at the same time that her lithium was decreased.  She felt that the levodopa helped, but I always have wondered if it was just the decrease in the lithium that helped.  May do a levodopa challenge in the future, especially given equivocal DaTscan and very mild parkinsonian features.  -Depakote and lithium could be contributing, although she does have a little bit of rest tremor and it is unusual for those to cause rest tremor  2.  Bipolar disorder  -Suspect, as above, that she has lithium induced tremor.  Previously diagnosed with essential tremor for years, but that diagnosis cannot be made in the face of being on lithium.  She was treated in the past with primidone, but this is generally not effective for lithium induced tremor.  3.  Cervical dystonia  -Last Botox on August 5.  She was not sure that was helpful.  We will try 1 more time and see if we get more benefit.  -MRI cervical spine with advanced degenerative changes at C1-C2 with associated marrow edema and osteophytosis.  Patient understands that this is likely a source of significant neck pain, but not likely associated with dystonia.  Doubt that this is surgical in nature.  She declined referral to neurosx.  She is contemplating a referral to pain management for possible injections.  She will let me know about that.  4.  Atypical  headache syndrome  -Her other physicians are treating with Depakote, but apparently she was only taking on an as-needed basis.  I thought she was actually taking that for bipolar and was taking it regularly, but she told me that she was only taking 1/2 tablet as needed for headache.  She did give me the bottle away and it did say 1 tablet daily.  I told her to start taking that as addressed on the bottle and see how she did.  If it did not help, we can certainly refer her to the headache clinic.  Discussed with her that I generally do not treat headache in my movement clinic.   Subjective:   Tracey [name] was seen today in follow up for parkinsonism, dx with Parkinsons Disease by previous neuro but I was unsure of that dx.  My previous records were reviewed prior to todays visit as well as outside records available to me. Pt had DaTscan done since our last visit.  This was done on August 2.  There was mild asymmetry of intensity within the stratum with the left intensity greater than the right, but this was felt to be within normal limits.  MRI of the cervical spine was completed and showed advanced degenerative changes at C1-C2, associated with marrow edema and osteophytosis.  Discussed with patient that he certainly could be associated with significant neck pain, but not likely associated with her dystonia.  Patient had Botox done for cervical dystonia on  August 5.  She reports today that she didn't notice any difference with it.    States that biggest issue she has is that she will have some tinnitus that will radiate into the vertex.  "The whole area will feel like its being shot with needles and it sings really loud."  She will take VPA but she thought that she was supposed to take it prn.  She will split it and take it abortatively.    Current prescribed movement disorder medications: Carbidopa/levodopa 25/100, 1 tablet 3 times per day -she does think it helps significantly.   PREVIOUS  MEDICATIONS: primidone, 50 mg, 3 tablets daily (initially seemed to help); carbidopa/levodopa 10/100, 1 tablet 3 times per day  ALLERGIES:   Allergies  Allergen Reactions   Lisinopril Cough   Penicillins Itching    50 years ago   Adhesive [Tape] Itching   Atorvastatin Itching   Dilaudid [Hydromorphone Hcl] Itching   Hydromorphone Itching    CURRENT MEDICATIONS:  Outpatient Encounter Medications as of 10/23/2020  Medication Sig   acetaminophen (TYLENOL) 325 MG tablet Take 650 mg by mouth every 4 (four) hours as needed.   ALPRAZolam (XANAX) 0.5 MG tablet TAKE (1) TABLET TWICE DAILY AS NEEDED FOR ANXIETY.   aspirin 81 MG chewable tablet Chew by mouth daily.   clindamycin (CLEOCIN) 150 MG capsule Take 600mg  1 hour prior to dental procedure   divalproex (DEPAKOTE) 125 MG DR tablet Take 1 tablet (125 mg total) by mouth daily.   fexofenadine (ALLEGRA) 60 MG tablet Take 60 mg by mouth 2 (two) times daily as needed.   latanoprost (XALATAN) 0.005 % ophthalmic solution INSTILL 1 DROP IN EACH EYE AT BEDTIME.   lithium carbonate 150 MG capsule TAKE 1 CAPSULE TWICE DAILY WITH MEALS.   losartan (COZAAR) 50 MG tablet TAKE ONE TABLET BY MOUTH ONCE DAILY   MAGNESIUM PO Take by mouth.   Multiple Vitamin (MULTIVITAMIN) capsule Take 1 capsule by mouth daily.   omeprazole (PRILOSEC) 20 MG capsule TAKE (1) CAPSULE TWICE DAILY.   [DISCONTINUED] carbidopa-levodopa (SINEMET IR) 25-100 MG tablet Take 1 tablet by mouth 3 (three) times daily. 7am/11am/4pm   carbidopa-levodopa (SINEMET IR) 25-100 MG tablet Take 1 tablet by mouth 3 (three) times daily. 7am/11am/4pm   No facility-administered encounter medications on file as of 10/23/2020.    Objective:   PHYSICAL EXAMINATION:    VITALS:   Vitals:   10/23/20 0817  BP: 124/78  Pulse: 72  SpO2: 98%  Weight: 148 lb 3.2 oz (67.2 kg)  Height: 5\' 2"  (1.575 m)    GEN:  The patient appears stated age and is in NAD. HEENT:  Normocephalic, atraumatic.  The  mucous membranes are moist. The superficial temporal arteries are without ropiness or tenderness. CV:  RRR Lungs:  CTAB Neck/HEME:  There are no carotid bruits bilaterally.  Neurological examination:  Orientation: The patient is alert and oriented x3. Cranial nerves: There is good facial symmetry with minimal facial hypomimia. The speech is fluent and clear. Soft palate rises symmetrically and there is no tongue deviation. Hearing is intact to conversational tone. Sensation: Sensation is intact to light touch throughout Motor: Strength is at least antigravity x4.  Movement examination: Tone: There is no significant increased tone in the bilateral upper extremities Abnormal movements: Very rare tremor in the left thumb at rest Coordination:  There is mild decremation with RAM's, with any form of RAMS, including alternating supination and pronation of the forearm, hand opening and closing, finger taps,  heel taps and toe taps on the left Gait and Station: The patient has no difficulty arising out of a deep-seated chair without the use of the hands. The patient's stride length is good but she does have left shoulder higher than the right.  She has marked decreased arm swing bilaterally   I have reviewed and interpreted the following labs independently    Chemistry      Component Value Date/Time   NA 139 05/11/2020 0700   NA 139 01/26/2020 0000   NA 141 04/24/2012 0929   K 4.9 05/11/2020 0700   K 4.4 04/24/2012 0929   CL 107 05/11/2020 0700   CL 109 (H) 04/24/2012 0929   CO2 24 05/11/2020 0700   CO2 23 04/24/2012 0929   BUN 18 05/11/2020 0700   BUN 19 01/26/2020 0000   BUN 20.6 04/24/2012 0929   CREATININE 0.93 05/11/2020 0700   CREATININE 0.9 04/24/2012 0929   GLU 82 01/26/2020 0000      Component Value Date/Time   CALCIUM 10.3 05/11/2020 0700   CALCIUM 10.5 (H) 01/12/2020 2123   CALCIUM 10.0 04/24/2012 0929   ALKPHOS 81 01/26/2020 0000   ALKPHOS 75 04/24/2012 0929   AST 19  05/11/2020 0700   AST 20 04/24/2012 0929   ALT 13 05/11/2020 0700   ALT 12 04/24/2012 0929   BILITOT 0.5 05/11/2020 0700   BILITOT 0.36 04/24/2012 0929       Lab Results  Component Value Date   WBC 7.1 01/26/2020   HGB 11.7 (A) 01/26/2020   HCT 35 (A) 01/26/2020   MCV 100.5 (H) 01/13/2020   PLT 294 01/26/2020    Lab Results  Component Value Date   TSH 2.734 01/12/2020     Total time spent on today's visit was 31 minutes, including both face-to-face time and nonface-to-face time.  Time included that spent on review of records (prior notes available to me/labs/imaging if pertinent), discussing treatment and goals, answering patient's questions and coordinating care.  Cc:  Mast, Man X, NP

## 2020-10-23 ENCOUNTER — Ambulatory Visit: Payer: Medicare PPO | Admitting: Neurology

## 2020-10-23 ENCOUNTER — Encounter: Payer: Self-pay | Admitting: Neurology

## 2020-10-23 ENCOUNTER — Other Ambulatory Visit: Payer: Self-pay

## 2020-10-23 VITALS — BP 124/78 | HR 72 | Ht 62.0 in | Wt 148.2 lb

## 2020-10-23 DIAGNOSIS — G20 Parkinson's disease: Secondary | ICD-10-CM

## 2020-10-23 DIAGNOSIS — G243 Spasmodic torticollis: Secondary | ICD-10-CM | POA: Diagnosis not present

## 2020-10-23 DIAGNOSIS — M542 Cervicalgia: Secondary | ICD-10-CM | POA: Diagnosis not present

## 2020-10-23 DIAGNOSIS — G4486 Cervicogenic headache: Secondary | ICD-10-CM | POA: Diagnosis not present

## 2020-10-23 MED ORDER — CARBIDOPA-LEVODOPA 25-100 MG PO TABS: 1.0000 | ORAL_TABLET | Freq: Three times a day (TID) | ORAL | 1 refills | Status: DC

## 2020-10-23 NOTE — Patient Instructions (Signed)
Try taking the depakote (valproic acid) daily  We can refer you to pain management for injections in the neck if you would like We can also refer you to a headache center if the above don't help Let me know if you change your mind on the levodopa challenge test that we discussed.  For now, I know you want to stay on the medication and I sent it to the pharmacy.

## 2020-10-25 ENCOUNTER — Telehealth: Payer: Self-pay | Admitting: Neurology

## 2020-10-25 NOTE — Telephone Encounter (Signed)
Pt said she was seen on the 10th of oct, and she forgot what tat said was wrong with her. She would like a call back

## 2020-10-25 NOTE — Telephone Encounter (Signed)
Called patient and discussed the details of her AVS I did tell the patient about her cervical dystonia and that she has an unremarkable DaT Scan indicating that her tremors could be from something else. I told patient that Dr. Carles Collet really wanted to do an on off test with her to look at her tremors with and without carbidopa levodopa. Patient agreed to do testing and I am sending to [facility] to schedule for her to get scheduled for that testing. I told patient that I was not able to tell her she did not have Parkinsons but she does have Cervical  Dystonia and Parkinsonism symptoms which I read directly from the AVS to the patient

## 2020-10-31 ENCOUNTER — Telehealth: Payer: Self-pay | Admitting: Neurology

## 2020-10-31 ENCOUNTER — Other Ambulatory Visit: Payer: Self-pay

## 2020-10-31 DIAGNOSIS — G4486 Cervicogenic headache: Secondary | ICD-10-CM

## 2020-10-31 NOTE — Telephone Encounter (Signed)
Pt needs a call to get scheduled for the pain management center for the injections in neck, and HA clinic

## 2020-10-31 NOTE — Telephone Encounter (Signed)
Pt called back in returning Chelsea's call 

## 2020-10-31 NOTE — Telephone Encounter (Signed)
Spoke to patient and let her know I have sent in the referral to the headache clinic and we will see her here for Botox before sending her to any pain management per Dr. Carles Collet. Patient agreed and understood with no further questions

## 2020-11-02 ENCOUNTER — Ambulatory Visit: Payer: Medicare PPO | Admitting: Neurology

## 2020-11-02 ENCOUNTER — Telehealth: Payer: Self-pay

## 2020-11-02 NOTE — Telephone Encounter (Signed)
New message   Call [employer] at [phone] spoke with Rod Holler consent / delivery date of Botox medication.   [facility] is aware of the upcoming appt 11/17/2020 with Dr. Shela Commons rep started the referral processing in the system they will contact the patient for consent .  The botox Rx is on file and good till the end of the year.  I gave consent for delivery  address & hour of operation given   re-clarify of upcoming appt    Per Rod Holler: If the Botox medication is not received by 11/16/20 unable to get patient consent.

## 2020-11-04 ENCOUNTER — Other Ambulatory Visit: Payer: Self-pay | Admitting: Nurse Practitioner

## 2020-11-04 DIAGNOSIS — F304 Manic episode in full remission: Secondary | ICD-10-CM

## 2020-11-17 ENCOUNTER — Ambulatory Visit: Payer: Medicare PPO | Admitting: Neurology

## 2020-11-17 ENCOUNTER — Other Ambulatory Visit: Payer: Self-pay

## 2020-11-17 DIAGNOSIS — G243 Spasmodic torticollis: Secondary | ICD-10-CM | POA: Diagnosis not present

## 2020-11-17 MED ORDER — ONABOTULINUMTOXINA 100 UNITS IJ SOLR
300.0000 [IU] | Freq: Once | INTRAMUSCULAR | Status: AC
  Administered 2020-11-17: 300 [IU] via INTRAMUSCULAR

## 2020-11-17 NOTE — Procedures (Signed)
Botulinum Clinic   Procedure Note Botox  Attending: Dr. Wells Guiles Yaileen Hofferber  Preoperative Diagnosis(es): Cervical Dystonia  Result History  Didn't help as much as she hoped.  Dose increased today  Consent obtained from: The patient Benefits discussed included, but were not limited to decreased muscle tightness, increased joint range of motion, and decreased pain.  Risk discussed included, but were not limited pain and discomfort, bleeding, bruising, excessive weakness, venous thrombosis, muscle atrophy and dysphagia.  A copy of the patient medication guide was given to the patient which explains the blackbox warning.  Patients identity and treatment sites confirmed Yes.  .  Details of Procedure: Skin was cleaned with alcohol.  A 30 gauge, 25mm  needle was introduced to the target muscle, except for posterior splenius where 27 gauge, 1.5 inch needle used.   Prior to injection, the needle plunger was aspirated to make sure the needle was not within a blood vessel.  There was no blood retrieved on aspiration.    Following is a summary of the muscles injected  And the amount of Botulinum toxin used:   Dilution 0.9% preservative free saline mixed with 100 u Botox type A to make 10 U per 0.1cc  Injections  Location Left  Right Units Number of sites        Sternocleidomastoid 60  60 1  Splenius Capitus, posterior approach  100 100 1  Splenius Capitus, lateral approach  40 40 1  Levator Scapulae      Trapezius 20/20 20/20 80 4  L cervical paraspinal C5 20  20 1   TOTAL UNITS:   280     Agent: Botulinum Type A ( Onobotulinum Toxin type A ).  3 vials of Botox were used, each containing 100 units and freshly diluted with 1 mL of sterile, non-preserved saline   Total injected (Units): 300  Total wasted (Units): 0   Pt tolerated procedure well without complications.   Reinjection is anticipated in 3 months.

## 2020-11-27 ENCOUNTER — Other Ambulatory Visit: Payer: Self-pay | Admitting: Nurse Practitioner

## 2020-11-27 ENCOUNTER — Other Ambulatory Visit: Payer: Self-pay | Admitting: Orthopedic Surgery

## 2020-11-30 ENCOUNTER — Encounter: Payer: Self-pay | Admitting: Nurse Practitioner

## 2020-11-30 ENCOUNTER — Other Ambulatory Visit: Payer: Self-pay

## 2020-11-30 ENCOUNTER — Non-Acute Institutional Stay: Payer: Medicare PPO | Admitting: Nurse Practitioner

## 2020-11-30 DIAGNOSIS — K5901 Slow transit constipation: Secondary | ICD-10-CM | POA: Diagnosis not present

## 2020-11-30 DIAGNOSIS — G20 Parkinson's disease: Secondary | ICD-10-CM

## 2020-11-30 DIAGNOSIS — R609 Edema, unspecified: Secondary | ICD-10-CM

## 2020-11-30 DIAGNOSIS — R4189 Other symptoms and signs involving cognitive functions and awareness: Secondary | ICD-10-CM

## 2020-11-30 DIAGNOSIS — G243 Spasmodic torticollis: Secondary | ICD-10-CM

## 2020-11-30 DIAGNOSIS — K219 Gastro-esophageal reflux disease without esophagitis: Secondary | ICD-10-CM | POA: Diagnosis not present

## 2020-11-30 DIAGNOSIS — L309 Dermatitis, unspecified: Secondary | ICD-10-CM

## 2020-11-30 DIAGNOSIS — I10 Essential (primary) hypertension: Secondary | ICD-10-CM

## 2020-11-30 DIAGNOSIS — C49A3 Gastrointestinal stromal tumor of small intestine: Secondary | ICD-10-CM

## 2020-11-30 DIAGNOSIS — R001 Bradycardia, unspecified: Secondary | ICD-10-CM

## 2020-11-30 DIAGNOSIS — F304 Manic episode in full remission: Secondary | ICD-10-CM

## 2020-11-30 MED ORDER — DIVALPROEX SODIUM 125 MG PO DR TAB: 125.0000 mg | DELAYED_RELEASE_TABLET | Freq: Two times a day (BID) | ORAL | 2 refills | Status: DC

## 2020-11-30 MED ORDER — HYDROCORTISONE 1 % EX LOTN: 1.0000 "application " | TOPICAL_LOTION | Freq: Two times a day (BID) | CUTANEOUS | 2 refills | Status: DC

## 2020-11-30 NOTE — Assessment & Plan Note (Signed)
stable, on Lithium, GDR due to bradycardia, takes Alprazolam and Depakote as well, TSH 2.734 01/12/20

## 2020-11-30 NOTE — Assessment & Plan Note (Signed)
Prn Metamucil, MiraLax qd are effective.  

## 2020-11-30 NOTE — Assessment & Plan Note (Signed)
Itching and mid redness area with some soreness when touched left medial lower leg above the ankle. No cord like finding or left calf pain with dorsal flexion.  Apply 1% Hydrocortisone cream bid to affected area until seeing Dermatology.

## 2020-11-30 NOTE — Progress Notes (Signed)
Location:   Clinic [facility]   Place of Service:  Clinic (12) Provider: Marlana Latus NP  Code Status: DNR Goals of Care: [place] 10/23/2020  Does Patient Have a Medical Advance Directive? Yes  Type of Advance Directive Living will  Does patient want to make changes to medical advance directive? -  Copy of [place] in Chart? -  Would patient like information on creating a medical advance directive? -  Pre-existing out of facility DNR order (yellow form or pink MOST form) -     Chief Complaint  Patient presents with   Acute Visit    Patient returns to the clinic for her headache. Her left ankle is swollen and itching with a lump on top of left foot.      HPI: Patient is a 80 y.o. female seen today for medical management of chronic diseases.    Tinnitus, underwent eval of  ENT, suggested music background.              Hx of GIST of small intestine s/p resection. prn MiraLax. Daily Psyllium .             Constipation. Prn Metamucil, MiraLax qd are effective.              Hx of Parkinson's, stable(diplopia R+L lateral,  tremor in fingers much improved), on Sinemet f/u Neurology.              GERD, stable, on Omeprazole, Hgb 11.7 01/26/20             HTN, blood pressure is controlled on Losartan, Bun/creat 18/0.93 05/11/20             Bipolar disorder, stable, on Lithium, GDR due to bradycardia, takes Alprazolam and Depakote as well, TSH 2.734 01/12/20             Bradycardia, improved from 40s, Hx of normalized HR after Tylenol PM dc'd, GDR of Lithium helped too.              Hypercalcemia, f/u endocrinology, Ca 10.3 05/11/20, PTH 79 01/12/20. GDR of Lithium may help             BLE trace edema, L>R.              OA, takes  Tylenol, s/p R knee arthroplasty.              Cervical dystonia, s/p  inj Botox inj, no change.  Parkinson's, cervical degenerative changes/dystonia, CT/MRI 12/2019 ruled out acute process, aches in neck R>L travels to the back of  head, comes and goes, not disabling, f/u Neurosurgery. Failed Cymbalta, Gabapentin, Robaxin, Ultracet. last Botulinum Toxin inj 11/17/20 by Neurology. ESR 9, CRP 2.7 10/09/20. The patient stated Alprazolam and Tylenol are kind of effective. Pending headache and wellness center referred by Neurology.              Cognitive impairment: 12/2019 MRI brain showed chronic microvascular ischemic changes, no acute disease with mild cortical atrophy   Past Medical History:  Diagnosis Date   Anxiety    Arthritis    Cardiac conduction disorder 03/30/2012   Overview:  STORY: ETT 03/09/2012 Echo 03/07/2012 normal Dr Einar Gip, bradycardia felt due to glaucoma eye drops   Diverticulosis of colon    GERD (gastroesophageal reflux disease)    GIST (gastrointestinal stroma tumor), malignant, colon (HCC)    Heart murmur    History of colon polyps 10/24/2008   Hypertension  Major neurocognitive disorder due to Parkinson's disease, possible    Tremors possible parkinsons   Manic disorder, single episode, in full remission ([place]) 12/01/2009   Sixth nerve palsy     Past Surgical History:  Procedure Laterality Date   BILATERAL SALPINGOOPHORECTOMY  09/22/2007   CATARACT EXTRACTION Bilateral    Gastrointestinal Stroma Tumor,  Other  1960   GIST Surgery   ILEOCECETOMY  09/22/2007   OVARIAN CYST REMOVAL Right 1967   SMALL INTESTINE SURGERY  09/22/2007   TOTAL KNEE ARTHROPLASTY Right 10/05/2019   Procedure: RIGHT TOTAL KNEE ARTHROPLASTY;  Surgeon: Meredith Pel, MD;  Location: [facility];  Service: Orthopedics;  Laterality: Right;   TOTAL VAGINAL HYSTERECTOMY  1986   Fibroids    Allergies  Allergen Reactions   Lisinopril Cough   Penicillins Itching    50 years ago   Adhesive [Tape] Itching   Atorvastatin Itching   Dilaudid [Hydromorphone Hcl] Itching   Hydromorphone Itching    Allergies as of 11/30/2020       Reactions   Lisinopril Cough   Penicillins Itching   50 years ago   Adhesive [tape]  Itching   Atorvastatin Itching   Dilaudid [hydromorphone Hcl] Itching   Hydromorphone Itching        Medication List        Accurate as of November 30, 2020 11:59 PM. If you have any questions, ask your nurse or doctor.          acetaminophen 325 MG tablet Commonly known as: TYLENOL Take 650 mg by mouth every 4 (four) hours as needed.   ALPRAZolam 0.5 MG tablet Commonly known as: XANAX TAKE ONE TABLET BY MOUTH TWICE DAILY AS NEEDED FOR ANXIETY   aspirin 81 MG chewable tablet Chew by mouth daily.   carbidopa-levodopa 25-100 MG tablet Commonly known as: SINEMET IR Take 1 tablet by mouth 3 (three) times daily. 7am/11am/4pm   clindamycin 150 MG capsule Commonly known as: CLEOCIN Take 627m 1 hour prior to dental procedure   divalproex 125 MG DR tablet Commonly known as: Depakote Take 1 tablet (125 mg total) by mouth 2 (two) times daily. What changed: when to take this Changed by: Sonal Dorwart X Jennifier Smitherman, NP   fexofenadine 60 MG tablet Commonly known as: ALLEGRA Take 60 mg by mouth 2 (two) times daily as needed.   hydrocortisone 1 % lotion Apply 1 application topically 2 (two) times daily. Started by: Kandi Brusseau X Shamra Bradeen, NP   latanoprost 0.005 % ophthalmic solution Commonly known as: XALATAN INSTILL 1 DROP IN EACH EYE AT BEDTIME.   lithium carbonate 150 MG capsule TAKE 1 CAPSULE TWICE DAILY WITH MEALS.   losartan 50 MG tablet Commonly known as: COZAAR TAKE ONE TABLET BY MOUTH ONCE DAILY   MAGNESIUM PO Take by mouth.   multivitamin capsule Take 1 capsule by mouth daily.   omeprazole 20 MG capsule Commonly known as: PRILOSEC TAKE (1) CAPSULE TWICE DAILY.        Review of Systems:  Review of Systems  Constitutional:  Negative for appetite change, fatigue and fever.  HENT:  Positive for hearing loss and tinnitus. Negative for congestion and trouble swallowing.   Eyes:  Negative for visual disturbance.       Glaucoma, diplopia R+L lateral peripheral visual fields  only.   Respiratory:  Negative for shortness of breath.        Occasionally DOE, hacking cough  Cardiovascular:  Positive for leg swelling.  Gastrointestinal:  Negative for abdominal pain and  constipation.  Genitourinary:  Positive for frequency. Negative for dysuria and urgency.       Couple of times at night, no difficulty of returning asleep.   Musculoskeletal:  Positive for arthralgias and gait problem.       Walker.   Skin:        Itching and mid redness area with some soreness when touched left medial lower leg above the ankle. No cord like finding or left calf pain with dorsal flexion.   Neurological:  Positive for headaches. Negative for tremors, speech difficulty and light-headedness.       Tremors in hands/fingers at rest. L>R, near resolved,  comes and goes nature of the neck/occipital pain. Stiff neck. Pain the patent right knee, s/p TKR. Tremor is better since Sinemet.   Psychiatric/Behavioral:  Positive for sleep disturbance. Negative for confusion. The patient is nervous/anxious.        Relapsed mood, feels anxious or depressive mood.    Health Maintenance  Topic Date Due   Pneumonia Vaccine 15+ Years old (2 - PPSV23 if available, else PCV20) 06/29/2015   COVID-19 Vaccine (4 - Booster for Moderna series) 01/18/2020   TETANUS/TDAP  08/19/2021   INFLUENZA VACCINE  Completed   DEXA SCAN  Completed   Zoster Vaccines- Shingrix  Completed   HPV VACCINES  Aged Out    Physical Exam: Vitals:   11/30/20 1517  BP: 134/80  Pulse: (!) 57  Temp: (!) 96.7 F (35.9 C)  SpO2: 99%  Weight: 153 lb (69.4 kg)  Height: _0  (1.575 m)   Body mass index is 27.98 kg/m. Physical Exam Vitals and nursing note reviewed.  Constitutional:      Appearance: Normal appearance.  HENT:     Head: Normocephalic and atraumatic.     Mouth/Throat:     Mouth: Mucous membranes are moist.  Eyes:     Extraocular Movements: Extraocular movements intact.     Conjunctiva/sclera: Conjunctivae  normal.     Pupils: Pupils are equal, round, and reactive to light.  Neck:     Comments: Stiff neck Cardiovascular:     Rate and Rhythm: Regular rhythm. Bradycardia present.     Heart sounds: Murmur heard.     Comments: DP pulses present R+L. HR in 50s Pulmonary:     Breath sounds: No rales.  Abdominal:     General: Bowel sounds are normal.     Palpations: Abdomen is soft.     Tenderness: There is no abdominal tenderness.  Musculoskeletal:        General: Tenderness present.     Cervical back: Normal range of motion.     Right lower leg: Edema present.     Left lower leg: Edema present.     Comments: Chronic R knee pain is improved. S/p TKR right. Stiff neck, comes and goes neck/occipital pain. Trace edema BLE  Skin:    General: Skin is warm and dry.     Findings: Erythema present.     Comments: Itching and mid redness area with some soreness when touched left medial lower leg above the ankle. No cord like finding or left calf pain with dorsal flexion.    Neurological:     General: No focal deficit present.     Mental Status: She is alert and oriented to person, place, and time. Mental status is at baseline.     Motor: No weakness.     Coordination: Coordination abnormal.     Gait: Gait abnormal.  Comments: Resting tremor in fingers, near resolution.   Psychiatric:        Mood and Affect: Mood normal.        Behavior: Behavior normal.        Thought Content: Thought content normal.    Labs reviewed: Basic Metabolic Panel: Recent Labs    01/12/20 1610 01/12/20 2122 01/12/20 2123 01/13/20 0519 01/26/20 0000 05/11/20 0700  NA 141  --   --  143 139 139  K 4.9  --   --  3.9 5.1 4.9  CL 112*  --   --  115* 109* 107  CO2 22  --   --  22 25* 24  GLUCOSE 105*  --   --  102*  --  92  BUN 18  --   --  _0 CREATININE 1.13*  --   --  0.98 0.9 0.93  CALCIUM 10.8*  --    < > 9.8 10.0 10.3  TSH  --  2.734  --   --   --   --    < > = values in this interval not  displayed.   Liver Function Tests: Recent Labs    01/12/20 1610 01/26/20 0000 05/11/20 0700  AST 43* 11* 19  ALT _1 ALKPHOS 85 81  --   BILITOT 0.8  --  0.5  PROT 7.2  --  6.5  ALBUMIN 4.2 3.5  --    No results for input(s): LIPASE, AMYLASE in the last 8760 hours. No results for input(s): AMMONIA in the last 8760 hours. CBC: Recent Labs    01/12/20 1610 01/13/20 0519 01/26/20 0000  WBC 10.2 8.3 7.1  NEUTROABS  --   --  4,828.00  HGB 13.1 12.2 11.7*  HCT 42.2 38.9 35*  MCV 100.7* 100.5*  --   PLT 326 272 294   Lipid Panel: No results for input(s): CHOL, HDL, LDLCALC, TRIG, CHOLHDL, LDLDIRECT in the last 8760 hours. No results found for: HGBA1C  Procedures since last visit: No results found.  Assessment/Plan  Slow transit constipation Prn Metamucil, MiraLax qd are effective.   Parkinson disease ([place])  stable(diplopia R+L lateral,  tremor in fingers much improved), on Sinemet f/u Neurology.   GERD (gastroesophageal reflux disease) stable, on Omeprazole, Hgb 11.7 01/26/20  Essential (primary) hypertension blood pressure is controlled on Losartan, Bun/creat 18/0.93 05/11/20  Bipolar I disorder, single manic episode, in full remission ([place]) stable, on Lithium, GDR due to bradycardia, takes Alprazolam and Depakote as well, TSH 2.734 01/12/20  Bradycardia Bradycardia, improved from 40s, Hx of normalized HR after Tylenol PM dc'd, GDR of Lithium helped too.   Hypercalcemia  f/u endocrinology, Ca 10.3 05/11/20, PTH 79 01/12/20. GDR of Lithium may help  Edema trace edema   Cervical dystonia CT/MRI 12/2019 ruled out acute process, aches in neck R>L travels to the back of head, comes and goes, not disabling, f/u Neurosurgery. Failed Cymbalta, Gabapentin, Robaxin, Ultracet. last Botulinum Toxin inj 11/17/20 by Neurology-seems better. ESR 9, CRP 2.7 10/09/20 The patient stated Alprazolam and Tylenol are kind of effective. Pending headache and wellness center  referred by Neurology.  The patient stated Depakote has been some helping with her mood and neck/head pain, desire to try Depakote 156m bid.   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 prn MiraLax. Daily Psyllium .  Dermatitis Itching and mid redness area with some soreness when touched left medial lower leg above the ankle. No cord like finding or left calf pain with dorsal flexion.  Apply 1% Hydrocortisone cream bid to affected area until seeing Dermatology.    Labs/tests ordered:  none  Next appt:  01/04/2021

## 2020-11-30 NOTE — Assessment & Plan Note (Signed)
stable, on Omeprazole, Hgb 11.7 01/26/20

## 2020-11-30 NOTE — Assessment & Plan Note (Addendum)
trace edema

## 2020-11-30 NOTE — Assessment & Plan Note (Signed)
blood pressure is controlled on Losartan, Bun/creat 18/0.93 05/11/20

## 2020-11-30 NOTE — Assessment & Plan Note (Signed)
Hx of GIST of small intestine s/p resection. prn MiraLax. Daily Psyllium . 

## 2020-11-30 NOTE — Assessment & Plan Note (Signed)
f/u endocrinology, Ca 10.3 05/11/20, PTH 79 01/12/20. GDR of Lithium may help

## 2020-11-30 NOTE — Assessment & Plan Note (Signed)
Cognitive impairment: 12/2019 MRI brain showed chronic microvascular ischemic changes, no acute disease with mild cortical atrophy

## 2020-11-30 NOTE — Assessment & Plan Note (Signed)
Bradycardia, improved from 40s, Hx of normalized HR after Tylenol PM dc'd, GDR of Lithium helped too.

## 2020-11-30 NOTE — Assessment & Plan Note (Signed)
stable(diplopia R+L lateral,  tremor in fingers much improved), on Sinemet f/u Neurology.

## 2020-11-30 NOTE — Assessment & Plan Note (Addendum)
CT/MRI 12/2019 ruled out acute process, aches in neck R>L travels to the back of head, comes and goes, not disabling, f/u Neurosurgery. Failed Cymbalta, Gabapentin, Robaxin, Ultracet. last Botulinum Toxin inj 11/17/20 by Neurology-seems better. ESR 9, CRP 2.7 10/09/20 The patient stated Alprazolam and Tylenol are kind of effective. Pending headache and wellness center referred by Neurology.  The patient stated Depakote has been some helping with her mood and neck/head pain, desire to try Depakote $RemoveBefo'125mg'tUAUGilYmaC$  bid.

## 2020-12-01 ENCOUNTER — Encounter: Payer: Self-pay | Admitting: Nurse Practitioner

## 2020-12-13 ENCOUNTER — Telehealth: Payer: Self-pay | Admitting: Neurology

## 2020-12-13 NOTE — Telephone Encounter (Signed)
-----   Message from [place], DO sent at 11/17/2020 11:00 AM EDT ----- Call patient and find out how she did with last botox and if she would like to continue injections

## 2020-12-13 NOTE — Telephone Encounter (Signed)
Patient returned call to [place]. 

## 2020-12-13 NOTE — Telephone Encounter (Signed)
Called patient and was unable to reach her LVM to call me back

## 2020-12-13 NOTE — Telephone Encounter (Signed)
Pt stated it was very successful but wish it would of been more successful she wants to continue with her treatments the pharmacy has already reached out to her for payment for her next treatment. Her next botox is 02/16/21

## 2020-12-26 DIAGNOSIS — D229 Melanocytic nevi, unspecified: Secondary | ICD-10-CM | POA: Diagnosis not present

## 2020-12-26 DIAGNOSIS — I8392 Asymptomatic varicose veins of left lower extremity: Secondary | ICD-10-CM | POA: Diagnosis not present

## 2020-12-26 DIAGNOSIS — L814 Other melanin hyperpigmentation: Secondary | ICD-10-CM | POA: Diagnosis not present

## 2020-12-26 DIAGNOSIS — L820 Inflamed seborrheic keratosis: Secondary | ICD-10-CM | POA: Diagnosis not present

## 2020-12-26 DIAGNOSIS — I872 Venous insufficiency (chronic) (peripheral): Secondary | ICD-10-CM | POA: Diagnosis not present

## 2020-12-26 DIAGNOSIS — D1801 Hemangioma of skin and subcutaneous tissue: Secondary | ICD-10-CM | POA: Diagnosis not present

## 2020-12-28 ENCOUNTER — Encounter: Payer: Self-pay | Admitting: Nurse Practitioner

## 2020-12-28 ENCOUNTER — Non-Acute Institutional Stay: Payer: Medicare PPO | Admitting: Nurse Practitioner

## 2020-12-28 ENCOUNTER — Other Ambulatory Visit: Payer: Self-pay

## 2020-12-28 DIAGNOSIS — I83892 Varicose veins of left lower extremities with other complications: Secondary | ICD-10-CM | POA: Insufficient documentation

## 2020-12-28 DIAGNOSIS — G243 Spasmodic torticollis: Secondary | ICD-10-CM | POA: Diagnosis not present

## 2020-12-28 DIAGNOSIS — C49A3 Gastrointestinal stromal tumor of small intestine: Secondary | ICD-10-CM

## 2020-12-28 DIAGNOSIS — M159 Polyosteoarthritis, unspecified: Secondary | ICD-10-CM

## 2020-12-28 DIAGNOSIS — F304 Manic episode in full remission: Secondary | ICD-10-CM

## 2020-12-28 DIAGNOSIS — G20 Parkinson's disease: Secondary | ICD-10-CM

## 2020-12-28 DIAGNOSIS — I10 Essential (primary) hypertension: Secondary | ICD-10-CM

## 2020-12-28 DIAGNOSIS — R4189 Other symptoms and signs involving cognitive functions and awareness: Secondary | ICD-10-CM

## 2020-12-28 DIAGNOSIS — K5901 Slow transit constipation: Secondary | ICD-10-CM

## 2020-12-28 DIAGNOSIS — R609 Edema, unspecified: Secondary | ICD-10-CM

## 2020-12-28 DIAGNOSIS — K219 Gastro-esophageal reflux disease without esophagitis: Secondary | ICD-10-CM

## 2020-12-28 DIAGNOSIS — R001 Bradycardia, unspecified: Secondary | ICD-10-CM

## 2020-12-28 DIAGNOSIS — J309 Allergic rhinitis, unspecified: Secondary | ICD-10-CM

## 2020-12-28 DIAGNOSIS — H9313 Tinnitus, bilateral: Secondary | ICD-10-CM

## 2020-12-28 MED ORDER — LORATADINE 10 MG PO TABS: 10.0000 mg | ORAL_TABLET | Freq: Every day | ORAL | 1 refills | Status: DC

## 2020-12-28 MED ORDER — DICLOFENAC SODIUM 1 % EX GEL: 2.0000 g | Freq: Four times a day (QID) | CUTANEOUS | 5 refills | Status: DC

## 2020-12-28 MED ORDER — GUAIFENESIN ER 600 MG PO TB12
600.0000 mg | ORAL_TABLET | Freq: Two times a day (BID) | ORAL | 1 refills | Status: AC
Start: 2020-12-28 — End: 2021-01-07

## 2020-12-28 NOTE — Assessment & Plan Note (Signed)
Hx of GIST of small intestine s/p resection. prn MiraLax. Daily Psyllium . 

## 2020-12-28 NOTE — Assessment & Plan Note (Addendum)
BLE chronic, L>R. Medial thickened swelling area about her palm sized, mild warmth, redness, and indurated area for a few month. Varicose veins LLE not new. No pain in calf with the left foot dorsiflexion. Venous US LLE to r/o DVT, may consider vascular specialist if the patient desires.

## 2020-12-28 NOTE — Assessment & Plan Note (Signed)
Running nose noted, no sore throat or cough, she is afebrile. Will try Claritin, Mucinex.

## 2020-12-28 NOTE — Assessment & Plan Note (Signed)
takes  Tylenol, s/p R knee arthroplasty.  

## 2020-12-28 NOTE — Assessment & Plan Note (Signed)
Hx of Parkinson's, stable(diplopia R+L lateral,  tremor in fingers much improved), on Sinemet f/u Neurology.

## 2020-12-28 NOTE — Assessment & Plan Note (Signed)
f/u endocrinology, Ca 10.3 05/11/20, PTH 79 01/12/20. GDR of Lithium may help

## 2020-12-28 NOTE — Progress Notes (Signed)
Location:   Clinic [facility]   Place of Service:  Clinic (12) Provider: Chipper Oman NP  Code Status: DNR Goals of Care: [place] 12/28/2020  Does Patient Have a Medical Advance Directive? Yes  Type of Estate agent of [place];Living will  Does patient want to make changes to medical advance directive? No - Patient declined  Copy of Healthcare Power of Attorney in Chart? Yes - validated most recent copy scanned in chart (See row information)  Would patient like information on creating a medical advance directive? -  Pre-existing out of facility DNR order (yellow form or pink MOST form) -     Chief Complaint  Patient presents with   Acute Visit    Mild edema and tenderness on left ankle, patient seen dermatologist and was referred back to PCP for concern. Patient would also like her left knee examined.      HPI: Patient is a 80 y.o. female seen today for medical management of chronic diseases.      Tinnitus, underwent eval of  ENT, suggested music background.              Hx of GIST of small intestine s/p resection. prn MiraLax. Daily Psyllium .             Constipation. Prn Metamucil, MiraLax qd are effective.              Hx of Parkinson's, stable(diplopia R+L lateral,  tremor in fingers much improved), on Sinemet f/u Neurology.              GERD, stable, on Omeprazole, Hgb 11.7 01/26/20             HTN, blood pressure is controlled on Losartan, Bun/creat 18/0.93 05/11/20             Bipolar disorder, stable, on Lithium, GDR due to bradycardia, takes Alprazolam and Depakote as well, TSH 2.734 01/12/20             Bradycardia, improved from 40s, Hx of normalized HR after Tylenol PM dc'd, GDR of Lithium helped too.              Hypercalcemia, f/u endocrinology, Ca 10.3 05/11/20, PTH 79 01/12/20. GDR of Lithium may help             BLE chronic, L>R. Medial thickened swelling area about her palm sized, mild warmth, redness, and indurated area for a few  month. Varicose veins LLE not new.              OA, takes  Tylenol, s/p R knee arthroplasty.              Cervical dystonia, improved, s/p  inj Botox inj Parkinson's, cervical degenerative changes/dystonia, CT/MRI 12/2019 ruled out acute process, aches in neck R>L travels to the back of head, comes and goes, not disabling, f/u Neurosurgery. Failed Cymbalta, Gabapentin, Robaxin, Ultracet. 2x Botulinum Toxin inj by Neurology. ESR 9, CRP 2.7 10/09/20. The patient stated Alprazolam and Tylenol are kind of effective. Pending headache and wellness center referred by Neurology.              Cognitive impairment: 12/2019 MRI brain showed chronic microvascular ischemic changes, no acute disease with mild cortical atrophy  Past Medical History:  Diagnosis Date   Anxiety    Arthritis    Cardiac conduction disorder 03/30/2012   Overview:  STORY: ETT 03/09/2012 Echo 03/07/2012 normal Dr Jacinto Halim, bradycardia felt due to glaucoma eye  drops   Diverticulosis of colon    GERD (gastroesophageal reflux disease)    GIST (gastrointestinal stroma tumor), malignant, colon (HCC)    Heart murmur    History of colon polyps 10/24/2008   Hypertension    Major neurocognitive disorder due to Parkinson's disease, possible    Tremors possible parkinsons   Manic disorder, single episode, in full remission ([place]) 12/01/2009   Sixth nerve palsy     Past Surgical History:  Procedure Laterality Date   BILATERAL SALPINGOOPHORECTOMY  09/22/2007   CATARACT EXTRACTION Bilateral    Gastrointestinal Stroma Tumor,  Other  1960   GIST Surgery   ILEOCECETOMY  09/22/2007   OVARIAN CYST REMOVAL Right 1967   SMALL INTESTINE SURGERY  09/22/2007   TOTAL KNEE ARTHROPLASTY Right 10/05/2019   Procedure: RIGHT TOTAL KNEE ARTHROPLASTY;  Surgeon: Meredith Pel, MD;  Location: [place];  Service: Orthopedics;  Laterality: Right;   TOTAL VAGINAL HYSTERECTOMY  1986   Fibroids    Allergies  Allergen Reactions   Lisinopril Cough    Penicillins Itching    50 years ago   Adhesive [Tape] Itching   Atorvastatin Itching   Dilaudid [Hydromorphone Hcl] Itching   Hydromorphone Itching    Allergies as of 12/28/2020       Reactions   Lisinopril Cough   Penicillins Itching   50 years ago   Adhesive [tape] Itching   Atorvastatin Itching   Dilaudid [hydromorphone Hcl] Itching   Hydromorphone Itching        Medication List        Accurate as of December 28, 2020 11:59 PM. If you have any questions, ask your nurse or doctor.          acetaminophen 325 MG tablet Commonly known as: TYLENOL Take 650 mg by mouth every 4 (four) hours as needed.   ALPRAZolam 0.5 MG tablet Commonly known as: XANAX TAKE ONE TABLET BY MOUTH TWICE DAILY AS NEEDED FOR ANXIETY   aspirin 81 MG chewable tablet Chew by mouth daily.   carbidopa-levodopa 25-100 MG tablet Commonly known as: SINEMET IR Take 1 tablet by mouth 3 (three) times daily. 7am/11am/4pm   clindamycin 150 MG capsule Commonly known as: CLEOCIN Take $RemoveBefo'600mg'ELKTChstLMk$  1 hour prior to dental procedure   diclofenac Sodium 1 % Gel Commonly known as: Voltaren Apply 2 g topically 4 (four) times daily. Started by: Brigid Vandekamp X Milton Sagona, NP   divalproex 125 MG DR tablet Commonly known as: Depakote Take 1 tablet (125 mg total) by mouth 2 (two) times daily.   fexofenadine 60 MG tablet Commonly known as: ALLEGRA Take 60 mg by mouth 2 (two) times daily as needed.   guaiFENesin 600 MG 12 hr tablet Commonly known as: Mucinex Take 1 tablet (600 mg total) by mouth 2 (two) times daily for 10 days. Started by: Alisha Bacus X Katesha Eichel, NP   hydrocortisone 1 % lotion Apply 1 application topically 2 (two) times daily.   latanoprost 0.005 % ophthalmic solution Commonly known as: XALATAN INSTILL 1 DROP IN EACH EYE AT BEDTIME.   lithium carbonate 150 MG capsule TAKE 1 CAPSULE TWICE DAILY WITH MEALS.   loratadine 10 MG tablet Commonly known as: Claritin Take 1 tablet (10 mg total) by mouth  daily. Started by: Vermelle Cammarata X Paytin Ramakrishnan, NP   losartan 50 MG tablet Commonly known as: COZAAR TAKE ONE TABLET BY MOUTH ONCE DAILY   MAGNESIUM PO Take by mouth.   multivitamin capsule Take 1 capsule by mouth daily.   omeprazole 20  MG capsule Commonly known as: PRILOSEC TAKE (1) CAPSULE TWICE DAILY.        Review of Systems:  Review of Systems  Constitutional:  Negative for appetite change, fatigue and fever.  HENT:  Positive for hearing loss, rhinorrhea and tinnitus. Negative for trouble swallowing.   Eyes:  Negative for visual disturbance.       Glaucoma, diplopia R+L lateral peripheral visual fields only.   Respiratory:  Negative for shortness of breath.        Occasionally DOE, hacking cough  Cardiovascular:  Positive for leg swelling.  Gastrointestinal:  Negative for abdominal pain and constipation.  Genitourinary:  Positive for frequency. Negative for dysuria and urgency.       Couple of times at night, no difficulty of returning asleep.   Musculoskeletal:  Positive for arthralgias and gait problem.       Walker.   Skin:  Negative for color change.  Neurological:  Positive for headaches. Negative for tremors, speech difficulty and light-headedness.       Tremors in hands/fingers at rest. L>R, near resolved,  comes and goes nature of the neck/occipital pain. Stiff neck. Pain the patent right knee, s/p TKR. Tremor is better since Sinemet.   Psychiatric/Behavioral:  Positive for sleep disturbance. Negative for confusion. The patient is nervous/anxious.        Relapsed mood, feels anxious or depressive mood.    Health Maintenance  Topic Date Due   COVID-19 Vaccine (4 - Booster for Moderna series) 08/08/2020   TETANUS/TDAP  08/19/2021   Pneumonia Vaccine 67+ Years old  Completed   INFLUENZA VACCINE  Completed   DEXA SCAN  Completed   Zoster Vaccines- Shingrix  Completed   HPV VACCINES  Aged Out    Physical Exam: Vitals:   12/28/20 1113  BP: 122/74  Pulse: 63  Temp: (!)  97.1 F (36.2 C)  SpO2: 99%  Weight: 150 lb 6.4 oz (68.2 kg)  Height: 5\' 2"  (1.575 m)   Body mass index is 27.51 kg/m. Physical Exam Vitals and nursing note reviewed.  Constitutional:      Appearance: Normal appearance.  HENT:     Head: Normocephalic and atraumatic.     Nose: Rhinorrhea present.     Mouth/Throat:     Mouth: Mucous membranes are moist.     Pharynx: No oropharyngeal exudate or posterior oropharyngeal erythema.  Eyes:     Extraocular Movements: Extraocular movements intact.     Conjunctiva/sclera: Conjunctivae normal.     Pupils: Pupils are equal, round, and reactive to light.  Cardiovascular:     Rate and Rhythm: Regular rhythm. Bradycardia present.     Heart sounds: Murmur heard.     Comments: DP pulses present R+L. HR in 50s Pulmonary:     Effort: Pulmonary effort is normal.     Breath sounds: No rales.  Abdominal:     General: Bowel sounds are normal.     Palpations: Abdomen is soft.     Tenderness: There is no abdominal tenderness.  Musculoskeletal:        General: Tenderness present.     Cervical back: Normal range of motion and neck supple. No tenderness.     Right lower leg: Edema present.     Left lower leg: Edema present.     Comments: Chronic R knee pain is improved. S/p TKR right. Stiff neck, comes and goes neck/occipital pain. Trace edema BLE  Skin:    General: Skin is warm and dry.  Findings: Erythema present.     Comments: The medial lower left leg a palm sized thickened, slightly reddened area, mild discomfort when palpated. DP present left foot, no pain in calf with left foot dorsiflexion. Scattered varicose veins left leg.    Neurological:     General: No focal deficit present.     Mental Status: She is alert and oriented to person, place, and time. Mental status is at baseline.     Motor: No weakness.     Coordination: Coordination abnormal.     Gait: Gait abnormal.     Comments: Resting tremor in fingers, near resolution.    Psychiatric:        Mood and Affect: Mood normal.        Behavior: Behavior normal.        Thought Content: Thought content normal.    Labs reviewed: Basic Metabolic Panel: Recent Labs    01/12/20 1610 01/12/20 2122 01/12/20 2123 01/13/20 0519 01/26/20 0000 05/11/20 0700  NA 141  --   --  143 139 139  K 4.9  --   --  3.9 5.1 4.9  CL 112*  --   --  115* 109* 107  CO2 22  --   --  22 25* 24  GLUCOSE 105*  --   --  102*  --  92  BUN 18  --   --  $R'20 19 18  'As$ CREATININE 1.13*  --   --  0.98 0.9 0.93  CALCIUM 10.8*  --    < > 9.8 10.0 10.3  TSH  --  2.734  --   --   --   --    < > = values in this interval not displayed.   Liver Function Tests: Recent Labs    01/12/20 1610 01/26/20 0000 05/11/20 0700  AST 43* 11* 19  ALT $Re'23 12 13  'kUl$ ALKPHOS 85 81  --   BILITOT 0.8  --  0.5  PROT 7.2  --  6.5  ALBUMIN 4.2 3.5  --    No results for input(s): LIPASE, AMYLASE in the last 8760 hours. No results for input(s): AMMONIA in the last 8760 hours. CBC: Recent Labs    01/12/20 1610 01/13/20 0519 01/26/20 0000  WBC 10.2 8.3 7.1  NEUTROABS  --   --  4,828.00  HGB 13.1 12.2 11.7*  HCT 42.2 38.9 35*  MCV 100.7* 100.5*  --   PLT 326 272 294   Lipid Panel: No results for input(s): CHOL, HDL, LDLCALC, TRIG, CHOLHDL, LDLDIRECT in the last 8760 hours. No results found for: HGBA1C  Procedures since last visit: No results found.  Assessment/Plan  Edema BLE chronic, L>R. Medial thickened swelling area about her palm sized, mild warmth, redness, and indurated area for a few month. Varicose veins LLE not new. No pain in calf with the left foot dorsiflexion. Venous US LLE to r/o DVT, may consider vascular specialist if the patient desires.   Arthritis, degenerative takes  Tylenol, s/p R knee arthroplasty.   Cervical dystonia Cervical dystonia, s/p  inj Botox inj x2, taking Depakote bid,  improved. Pending Headache center consultation.   Parkinson disease ([place]) Parkinson's,  cervical degenerative changes/dystonia, CT/MRI 12/2019 ruled out acute process, aches in neck R>L travels to the back of head, comes and goes, not disabling, f/u Neurosurgery. Failed Cymbalta, Gabapentin, Robaxin, Ultracet. last Botulinum Toxin inj 11/17/20 by Neurology. ESR 9, CRP 2.7 10/09/20. The patient stated Alprazolam and Tylenol are kind of effective.  Pending headache and wellness center referred by Neurology.   Cognitive decline 12/2019 MRI brain showed chronic microvascular ischemic changes, no acute disease with mild cortical atrophy  Hypercalcemia f/u endocrinology, Ca 10.3 05/11/20, PTH 79 01/12/20. GDR of Lithium may help  Bradycardia improved from 40s, Hx of normalized HR after Tylenol PM dc'd, GDR of Lithium helped too.   Bipolar I disorder, single manic episode, in full remission ([place])  stable, on Lithium, GDR due to bradycardia, takes Alprazolam and Depakote as well, TSH 2.734 01/12/20  Essential (primary) hypertension blood pressure is controlled on Losartan, Bun/creat 18/0.93 05/11/20  GERD (gastroesophageal reflux disease) stable, on Omeprazole, Hgb 11.7 01/26/20  Parkinsonism ([place]) Hx of Parkinson's, stable(diplopia R+L lateral,  tremor in fingers much improved), on Sinemet f/u Neurology.  Slow transit constipation Prn Metamucil, MiraLax qd are effective.   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 prn MiraLax. Daily Psyllium .  Tinnitus of both ears underwent eval of  ENT, suggested music background.   Varicose veins of left leg with edema The patient stated she has had varicose veins for at least 50 years, progressing gradually, may consider vascular specialist consultation in setting of medial lower left leg a palm sized thickened slightly reddened area, mild discomfort when palpated. DP present left foot, no pain in calf with left foot dorsiflexion. Will apply  Voltaren gel 4x/day for now.   Allergic rhinitis Running nose noted, no sore throat or cough, she is afebrile. Will try Claritin, Mucinex.    Labs/tests ordered:  US venous LLE  Next appt:  01/04/2021

## 2020-12-28 NOTE — Assessment & Plan Note (Signed)
underwent eval of  ENT, suggested music background.  

## 2020-12-28 NOTE — Assessment & Plan Note (Signed)
stable, on Omeprazole, Hgb 11.7 01/26/20

## 2020-12-28 NOTE — Assessment & Plan Note (Addendum)
Cervical dystonia, s/p  inj Botox inj x2, taking Depakote bid,  improved. Pending Headache center consultation.

## 2020-12-28 NOTE — Assessment & Plan Note (Signed)
Parkinson's, cervical degenerative changes/dystonia, CT/MRI 12/2019 ruled out acute process, aches in neck R>L travels to the back of head, comes and goes, not disabling, f/u Neurosurgery. Failed Cymbalta, Gabapentin, Robaxin, Ultracet. last Botulinum Toxin inj 11/17/20 by Neurology. ESR 9, CRP 2.7 10/09/20. The patient stated Alprazolam and Tylenol are kind of effective. Pending headache and wellness center referred by Neurology.

## 2020-12-28 NOTE — Assessment & Plan Note (Signed)
stable, on Lithium, GDR due to bradycardia, takes Alprazolam and Depakote as well, TSH 2.734 01/12/20

## 2020-12-28 NOTE — Assessment & Plan Note (Signed)
Prn Metamucil, MiraLax qd are effective.  

## 2020-12-28 NOTE — Assessment & Plan Note (Signed)
improved from 40s, Hx of normalized HR after Tylenol PM dc'd, GDR of Lithium helped too.

## 2020-12-28 NOTE — Assessment & Plan Note (Addendum)
The patient stated she has had varicose veins for at least 50 years, progressing gradually, may consider vascular specialist consultation in setting of medial lower left leg a palm sized thickened slightly reddened area, mild discomfort when palpated. DP present left foot, no pain in calf with left foot dorsiflexion. Will apply Voltaren gel 4x/day for now.

## 2020-12-28 NOTE — Assessment & Plan Note (Signed)
12/2019 MRI brain showed chronic microvascular ischemic changes, no acute disease with mild cortical atrophy 

## 2020-12-28 NOTE — Assessment & Plan Note (Signed)
blood pressure is controlled on Losartan, Bun/creat 18/0.93 05/11/20

## 2020-12-29 ENCOUNTER — Encounter: Payer: Self-pay | Admitting: Nurse Practitioner

## 2021-01-02 ENCOUNTER — Ambulatory Visit (HOSPITAL_COMMUNITY)
Admission: RE | Admit: 2021-01-02 | Discharge: 2021-01-02 | Disposition: A | Discharge status: Home / Self Care | Payer: Medicare PPO | Source: Ambulatory Visit | Attending: Nurse Practitioner | Admitting: Nurse Practitioner

## 2021-01-02 ENCOUNTER — Other Ambulatory Visit: Payer: Self-pay

## 2021-01-02 DIAGNOSIS — R609 Edema, unspecified: Secondary | ICD-10-CM | POA: Insufficient documentation

## 2021-01-04 ENCOUNTER — Encounter: Payer: Self-pay | Admitting: Nurse Practitioner

## 2021-01-04 ENCOUNTER — Non-Acute Institutional Stay: Payer: Medicare PPO | Admitting: Nurse Practitioner

## 2021-01-04 ENCOUNTER — Other Ambulatory Visit: Payer: Self-pay

## 2021-01-04 DIAGNOSIS — I10 Essential (primary) hypertension: Secondary | ICD-10-CM

## 2021-01-04 DIAGNOSIS — C49A3 Gastrointestinal stromal tumor of small intestine: Secondary | ICD-10-CM

## 2021-01-04 DIAGNOSIS — M159 Polyosteoarthritis, unspecified: Secondary | ICD-10-CM

## 2021-01-04 DIAGNOSIS — K5901 Slow transit constipation: Secondary | ICD-10-CM | POA: Diagnosis not present

## 2021-01-04 DIAGNOSIS — R001 Bradycardia, unspecified: Secondary | ICD-10-CM

## 2021-01-04 DIAGNOSIS — G243 Spasmodic torticollis: Secondary | ICD-10-CM

## 2021-01-04 DIAGNOSIS — H9313 Tinnitus, bilateral: Secondary | ICD-10-CM | POA: Diagnosis not present

## 2021-01-04 DIAGNOSIS — M150 Primary generalized (osteo)arthritis: Secondary | ICD-10-CM

## 2021-01-04 DIAGNOSIS — R609 Edema, unspecified: Secondary | ICD-10-CM

## 2021-01-04 DIAGNOSIS — R4189 Other symptoms and signs involving cognitive functions and awareness: Secondary | ICD-10-CM

## 2021-01-04 DIAGNOSIS — G20A1 Parkinson's disease without dyskinesia, without mention of fluctuations: Secondary | ICD-10-CM

## 2021-01-04 DIAGNOSIS — I83892 Varicose veins of left lower extremities with other complications: Secondary | ICD-10-CM

## 2021-01-04 DIAGNOSIS — F304 Manic episode in full remission: Secondary | ICD-10-CM

## 2021-01-04 DIAGNOSIS — G20 Parkinson's disease: Secondary | ICD-10-CM

## 2021-01-04 DIAGNOSIS — G20C Parkinsonism, unspecified: Secondary | ICD-10-CM

## 2021-01-04 DIAGNOSIS — K219 Gastro-esophageal reflux disease without esophagitis: Secondary | ICD-10-CM

## 2021-01-04 NOTE — Assessment & Plan Note (Signed)
stable, on Lithium, GDR due to bradycardia, takes Alprazolam and Depakote as well, TSH 2.734 01/12/20

## 2021-01-04 NOTE — Assessment & Plan Note (Addendum)
Edema, LLE negative DVT venous US, L>R. Improved medial thickened swelling area about her palm sized, mild warmth, redness, and indurated area for a few month. Varicose veins LLE not new. continue Voltaren gel, vascular specialist referral when the patient desires.

## 2021-01-04 NOTE — Assessment & Plan Note (Signed)
Edema, LLE negative DVT venous US, L>R. Medial thickened swelling area about her palm sized, mild warmth, redness, and indurated area for a few month-Voltaren Gel helped. Varicose veins LLE not new.

## 2021-01-04 NOTE — Assessment & Plan Note (Signed)
stable(diplopia R+L lateral,  tremor in fingers much improved), on Sinemet f/u Neurology.

## 2021-01-04 NOTE — Assessment & Plan Note (Signed)
12/2019 MRI brain showed chronic microvascular ischemic changes, no acute disease with mild cortical atrophy 

## 2021-01-04 NOTE — Assessment & Plan Note (Signed)
takes  Tylenol, s/p R knee arthroplasty.  

## 2021-01-04 NOTE — Assessment & Plan Note (Signed)
Prn Metamucil, MiraLax qd are effective.  

## 2021-01-04 NOTE — Assessment & Plan Note (Signed)
blood pressure is controlled on Losartan, Bun/creat 18/0.93 05/11/20

## 2021-01-04 NOTE — Assessment & Plan Note (Deleted)
stable, on Lithium, GDR due to bradycardia, takes Alprazolam and Depakote as well, TSH 2.734 01/12/20

## 2021-01-04 NOTE — Assessment & Plan Note (Signed)
,   improved, s/p  inj Botox inj

## 2021-01-04 NOTE — Assessment & Plan Note (Signed)
cervical degenerative changes/dystonia, CT/MRI 12/2019 ruled out acute process, aches in neck R>L travels to the back of head, comes and goes, not disabling, f/u Neurosurgery. Failed Cymbalta, Gabapentin, Robaxin, Ultracet. 2x Botulinum Toxin inj by Neurology. ESR 9, CRP 2.7 10/09/20. The patient stated Alprazolam and Tylenol are kind of effective. Pending headache and wellness center referred by Neurology.

## 2021-01-04 NOTE — Assessment & Plan Note (Signed)
stable, on Omeprazole, Hgb 11.7 01/26/20

## 2021-01-04 NOTE — Assessment & Plan Note (Signed)
Hx of GIST of small intestine s/p resection. prn MiraLax. Daily Psyllium . 

## 2021-01-04 NOTE — Assessment & Plan Note (Addendum)
improved from 40s for her current baseline 50s after Tylenol PM dc'd, GDR of Lithium helped too.

## 2021-01-04 NOTE — Assessment & Plan Note (Signed)
underwent eval of  ENT, suggested music background.  

## 2021-01-04 NOTE — Progress Notes (Signed)
Location:   clinic [place]   Place of Service:  Clinic (12) Provider: Marlana Latus NP  Code Status: DNR Goals of Care: [place] 01/04/2021  Does Patient Have a Medical Advance Directive? Yes  Type of Paramedic of [place];Living will  Does patient want to make changes to medical advance directive? No - Patient declined  Copy of [place] in Chart? Yes - validated most recent copy scanned in chart (See row information)  Would patient like information on creating a medical advance directive? -  Pre-existing out of facility DNR order (yellow form or pink MOST form) -     Chief Complaint  Patient presents with   Medical Management of Chronic Issues    4 month follow-up. Discuss need for covid booster or post pone if patient refuses. NCIR verified. Patient would also like to follow-up on her left ankle.     HPI: Patient is a 80 y.o. female seen today for medical management of chronic diseases.    Edema, LLE negative DVT venous US, L>R. Medial thickened swelling area about her palm sized, mild warmth, redness, and indurated area for a few month. Varicose veins LLE not new.  Tinnitus, underwent eval of  ENT, suggested music background.              Hx of GIST of small intestine s/p resection. prn MiraLax. Daily Psyllium .             Constipation. Prn Metamucil, MiraLax qd are effective.              Hx of Parkinson's, stable(diplopia R+L lateral,  tremor in fingers much improved), on Sinemet f/u Neurology.              GERD, stable, on Omeprazole, Hgb 11.7 01/26/20             HTN, blood pressure is controlled on Losartan, Bun/creat 18/0.93 05/11/20             Bipolar disorder, stable, on Lithium, GDR due to bradycardia, takes Alprazolam and Depakote as well, TSH 2.734 01/12/20             Bradycardia, improved from 40s to 50ss. Hx of normalized HR after Tylenol PM dc'd, GDR of Lithium helped too.              Hypercalcemia, f/u  endocrinology, Ca 10.3 05/11/20, PTH 79 01/12/20. GDR of Lithium may help             OA, takes  Tylenol, s/p R knee arthroplasty.              Cervical dystonia, improved, s/p  inj Botox inj Parkinson's, cervical degenerative changes/dystonia, CT/MRI 12/2019 ruled out acute process, aches in neck R>L travels to the back of head, comes and goes, not disabling, f/u Neurosurgery. Failed Cymbalta, Gabapentin, Robaxin, Ultracet. 2x Botulinum Toxin inj by Neurology. ESR 9, CRP 2.7 10/09/20. The patient stated Alprazolam and Tylenol are kind of effective. Pending headache and wellness center referred by Neurology.              Cognitive impairment: 12/2019 MRI brain showed chronic microvascular ischemic changes, no acute disease with mild cortical atrophy   Past Medical History:  Diagnosis Date   Anxiety    Arthritis    Cardiac conduction disorder 03/30/2012   Overview:  STORY: ETT 03/09/2012 Echo 03/07/2012 normal Dr Einar Gip, bradycardia felt due to glaucoma eye drops   Diverticulosis of  colon    GERD (gastroesophageal reflux disease)    GIST (gastrointestinal stroma tumor), malignant, colon (HCC)    Heart murmur    History of colon polyps 10/24/2008   Hypertension    Major neurocognitive disorder due to Parkinson's disease, possible    Tremors possible parkinsons   Manic disorder, single episode, in full remission ([place]) 12/01/2009   Sixth nerve palsy     Past Surgical History:  Procedure Laterality Date   BILATERAL SALPINGOOPHORECTOMY  09/22/2007   CATARACT EXTRACTION Bilateral    Gastrointestinal Stroma Tumor,  Other  1960   GIST Surgery   ILEOCECETOMY  09/22/2007   OVARIAN CYST REMOVAL Right 1967   SMALL INTESTINE SURGERY  09/22/2007   TOTAL KNEE ARTHROPLASTY Right 10/05/2019   Procedure: RIGHT TOTAL KNEE ARTHROPLASTY;  Surgeon: Meredith Pel, MD;  Location: [place];  Service: Orthopedics;  Laterality: Right;   TOTAL VAGINAL HYSTERECTOMY  1986   Fibroids    Allergies  Allergen  Reactions   Lisinopril Cough   Penicillins Itching    50 years ago   Adhesive [Tape] Itching   Atorvastatin Itching   Dilaudid [Hydromorphone Hcl] Itching   Hydromorphone Itching    Allergies as of 01/04/2021       Reactions   Lisinopril Cough   Penicillins Itching   50 years ago   Adhesive [tape] Itching   Atorvastatin Itching   Dilaudid [hydromorphone Hcl] Itching   Hydromorphone Itching        Medication List        Accurate as of January 04, 2021 11:59 PM. If you have any questions, ask your nurse or doctor.          acetaminophen 325 MG tablet Commonly known as: TYLENOL Take 650 mg by mouth every 4 (four) hours as needed.   ALPRAZolam 0.5 MG tablet Commonly known as: XANAX TAKE ONE TABLET BY MOUTH TWICE DAILY AS NEEDED FOR ANXIETY   aspirin 81 MG chewable tablet Chew by mouth daily.   carbidopa-levodopa 25-100 MG tablet Commonly known as: SINEMET IR Take 1 tablet by mouth 3 (three) times daily. 7am/11am/4pm   clindamycin 150 MG capsule Commonly known as: CLEOCIN Take 632m 1 hour prior to dental procedure   diclofenac Sodium 1 % Gel Commonly known as: Voltaren Apply 2 g topically 4 (four) times daily.   divalproex 125 MG DR tablet Commonly known as: Depakote Take 1 tablet (125 mg total) by mouth 2 (two) times daily.   fexofenadine 60 MG tablet Commonly known as: ALLEGRA Take 60 mg by mouth 2 (two) times daily as needed.   guaiFENesin 600 MG 12 hr tablet Commonly known as: Mucinex Take 1 tablet (600 mg total) by mouth 2 (two) times daily for 10 days.   hydrocortisone 1 % lotion Apply 1 application topically 2 (two) times daily.   latanoprost 0.005 % ophthalmic solution Commonly known as: XALATAN INSTILL 1 DROP IN EACH EYE AT BEDTIME.   lithium carbonate 150 MG capsule TAKE 1 CAPSULE TWICE DAILY WITH MEALS.   loratadine 10 MG tablet Commonly known as: Claritin Take 1 tablet (10 mg total) by mouth daily.   losartan 50 MG  tablet Commonly known as: COZAAR TAKE ONE TABLET BY MOUTH ONCE DAILY   MAGNESIUM PO Take by mouth.   multivitamin capsule Take 1 capsule by mouth daily.   omeprazole 20 MG capsule Commonly known as: PRILOSEC TAKE (1) CAPSULE TWICE DAILY.        Review of Systems:  Review  of Systems  Constitutional:  Negative for appetite change, fatigue and fever.  HENT:  Positive for hearing loss and tinnitus. Negative for rhinorrhea and trouble swallowing.   Eyes:  Negative for visual disturbance.       Glaucoma, diplopia R+L lateral peripheral visual fields only.   Respiratory:  Negative for shortness of breath.        Occasionally DOE, hacking cough  Cardiovascular:  Positive for leg swelling.  Gastrointestinal:  Negative for abdominal pain and constipation.  Genitourinary:  Positive for frequency. Negative for dysuria and urgency.       Couple of times at night, no difficulty of returning asleep.   Musculoskeletal:  Positive for arthralgias and gait problem.       Walker.   Skin:  Negative for color change.  Neurological:  Positive for headaches. Negative for tremors, speech difficulty and light-headedness.       Tremors in hands/fingers at rest. L>R, near resolved,  comes and goes nature of the neck/occipital pain. Stiff neck. Pain the patent right knee, s/p TKR. Tremor is better since Sinemet.   Psychiatric/Behavioral:  Positive for sleep disturbance. Negative for confusion. The patient is nervous/anxious.        Relapsed mood, feels anxious or depressive mood.    Health Maintenance  Topic Date Due   COVID-19 Vaccine (4 - Booster for Moderna series) 08/08/2020   TETANUS/TDAP  08/19/2021   Pneumonia Vaccine 60+ Years old  Completed   INFLUENZA VACCINE  Completed   DEXA SCAN  Completed   Zoster Vaccines- Shingrix  Completed   HPV VACCINES  Aged Out    Physical Exam: Vitals:   01/04/21 0934  BP: 118/72  Pulse: (!) 45  Temp: 97.9 F (36.6 C)  TempSrc: Temporal  SpO2: 99%   Weight: 147 lb (66.7 kg)  Height: _0  (1.575 m)   Body mass index is 26.89 kg/m. Physical Exam Vitals and nursing note reviewed.  Constitutional:      Appearance: Normal appearance.  HENT:     Head: Normocephalic and atraumatic.     Nose: Nose normal.     Mouth/Throat:     Mouth: Mucous membranes are moist.  Eyes:     Extraocular Movements: Extraocular movements intact.     Conjunctiva/sclera: Conjunctivae normal.     Pupils: Pupils are equal, round, and reactive to light.  Cardiovascular:     Rate and Rhythm: Regular rhythm. Bradycardia present.     Heart sounds: Murmur heard.     Comments: DP pulses present R+L. HR in 50s Pulmonary:     Effort: Pulmonary effort is normal.     Breath sounds: No rales.  Abdominal:     General: Bowel sounds are normal.     Palpations: Abdomen is soft.     Tenderness: There is no abdominal tenderness.  Musculoskeletal:        General: Tenderness present.     Cervical back: Normal range of motion and neck supple.     Right lower leg: Edema present.     Left lower leg: Edema present.     Comments: Chronic R knee pain is improved. S/p TKR right. Stiff neck, comes and goes neck/occipital pain. Trace edema BLE  Skin:    General: Skin is warm and dry.     Findings: Erythema present.     Comments: Improved the medial lower left leg a palm sized thickened, slightly reddened area, mild discomfort when palpated. DP present left foot. Scattered varicose veins left  leg.    Neurological:     General: No focal deficit present.     Mental Status: She is alert and oriented to person, place, and time. Mental status is at baseline.     Motor: No weakness.     Coordination: Coordination abnormal.     Gait: Gait abnormal.     Comments: Resting tremor in fingers, near resolution.   Psychiatric:        Mood and Affect: Mood normal.        Behavior: Behavior normal.        Thought Content: Thought content normal.    Labs reviewed: Basic Metabolic  Panel: Recent Labs    01/12/20 1610 01/12/20 2122 01/12/20 2123 01/13/20 0519 01/26/20 0000 05/11/20 0700  NA 141  --   --  143 139 139  K 4.9  --   --  3.9 5.1 4.9  CL 112*  --   --  115* 109* 107  CO2 22  --   --  22 25* 24  GLUCOSE 105*  --   --  102*  --  92  BUN 18  --   --  _0 CREATININE 1.13*  --   --  0.98 0.9 0.93  CALCIUM 10.8*  --    < > 9.8 10.0 10.3  TSH  --  2.734  --   --   --   --    < > = values in this interval not displayed.   Liver Function Tests: Recent Labs    01/12/20 1610 01/26/20 0000 05/11/20 0700  AST 43* 11* 19  ALT _1 ALKPHOS 85 81  --   BILITOT 0.8  --  0.5  PROT 7.2  --  6.5  ALBUMIN 4.2 3.5  --    No results for input(s): LIPASE, AMYLASE in the last 8760 hours. No results for input(s): AMMONIA in the last 8760 hours. CBC: Recent Labs    01/12/20 1610 01/13/20 0519 01/26/20 0000  WBC 10.2 8.3 7.1  NEUTROABS  --   --  4,828.00  HGB 13.1 12.2 11.7*  HCT 42.2 38.9 35*  MCV 100.7* 100.5*  --   PLT 326 272 294   Lipid Panel: No results for input(s): CHOL, HDL, LDLCALC, TRIG, CHOLHDL, LDLDIRECT in the last 8760 hours. No results found for: HGBA1C  Procedures since last visit: VAS [place] LOWER EXTREMITY VENOUS (DVT)  Result Date: 01/02/2021  Lower Venous DVT Study Patient Name:  Tracey [name]  Date of Exam:   01/02/2021 Medical Rec #: [id number]        Accession #:    [id number] Date of Birth: 1940-04-19        Patient Gender: F Patient Age:   48 years Exam Location:  [facility] Procedure:      VAS [place] LOWER EXTREMITY VENOUS (DVT) Referring Phys: Mynor Witkop --------------------------------------------------------------------------------  Indications: Swelling.  Performing Technologist: Ralene Cork RVT  Examination Guidelines: A complete evaluation includes B-mode imaging, spectral Doppler, color Doppler, and power Doppler as needed of all accessible portions of each vessel. Bilateral testing is considered an  integral part of a complete examination. Limited examinations for reoccurring indications may be performed as noted. The reflux portion of the exam is performed with the patient in reverse Trendelenburg.  +---------+---------------+---------+-----------+----------+--------------+  LEFT      Compressibility Phasicity Spontaneity Properties Thrombus Aging  +---------+---------------+---------+-----------+----------+--------------+  CFV       Full  Yes       Yes                                    +---------+---------------+---------+-----------+----------+--------------+  SFJ       Full                      Yes                                    +---------+---------------+---------+-----------+----------+--------------+  FV Prox   Full            Yes       Yes                                    +---------+---------------+---------+-----------+----------+--------------+  FV Mid    Full            Yes       Yes                                    +---------+---------------+---------+-----------+----------+--------------+  FV Distal Full            Yes       Yes                                    +---------+---------------+---------+-----------+----------+--------------+  POP       Full            Yes       Yes                                    +---------+---------------+---------+-----------+----------+--------------+  PTV       Full                      Yes                                    +---------+---------------+---------+-----------+----------+--------------+  PERO      Full                      Yes                                    +---------+---------------+---------+-----------+----------+--------------+  GSV       Full            Yes       Yes                                    +---------+---------------+---------+-----------+----------+--------------+  Summary: LEFT: - There is no evidence of deep vein thrombosis in the lower extremity. - There is no evidence of superficial venous thrombosis.  -  No cystic structure found in the popliteal fossa.  *See table(s) above for measurements and observations. Electronically signed  by Deitra Mayo MD on 01/02/2021 at 3:50:41 PM.    Final     Assessment/Plan  Varicose veins of left leg with edema Edema, LLE negative DVT venous US, L>R. Improved medial thickened swelling area about her palm sized, mild warmth, redness, and indurated area for a few month. Varicose veins LLE not new. continue Voltaren gel, vascular specialist referral when the patient desires.   Tinnitus of both ears underwent eval of  ENT, suggested music background.  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 prn MiraLax. Daily Psyllium .  Slow transit constipation Prn Metamucil, MiraLax qd are effective.   Parkinson disease ([place])  stable(diplopia R+L lateral,  tremor in fingers much improved), on Sinemet f/u Neurology.   GERD (gastroesophageal reflux disease)  stable, on Omeprazole, Hgb 11.7 01/26/20  Essential (primary) hypertension blood pressure is controlled on Losartan, Bun/creat 18/0.93 05/11/20  Bipolar I disorder, single manic episode, in full remission ([place])  stable, on Lithium, GDR due to bradycardia, takes Alprazolam and Depakote as well, TSH 2.734 01/12/20  Bradycardia  improved from 40s for her current baseline 50s after Tylenol PM dc'd, GDR of Lithium helped too.   Hypercalcemia f/u endocrinology, Ca 10.3 05/11/20, PTH 79 01/12/20. GDR of Lithium may help  Arthritis, degenerative takes  Tylenol, s/p R knee arthroplasty.   Cervical dystonia , improved, s/p  inj Botox inj  Parkinsonism ([place])  cervical degenerative changes/dystonia, CT/MRI 12/2019 ruled out acute process, aches in neck R>L travels to the back of head, comes and goes, not disabling, f/u Neurosurgery. Failed Cymbalta, Gabapentin, Robaxin, Ultracet. 2x Botulinum Toxin inj by  Neurology. ESR 9, CRP 2.7 10/09/20. The patient stated Alprazolam and Tylenol are kind of effective. Pending headache and wellness center referred by Neurology.   Cognitive decline  12/2019 MRI brain showed chronic microvascular ischemic changes, no acute disease with mild cortical atrophy  Edema Edema, LLE negative DVT venous US, L>R. Medial thickened swelling area about her palm sized, mild warmth, redness, and indurated area for a few month-Voltaren Gel helped. Varicose veins LLE not new.    Labs/tests ordered: CBC/diff, CMP/eGFR, TSH, lipid panel, valproic acid level prior   Next appt:  3 months.

## 2021-01-04 NOTE — Assessment & Plan Note (Signed)
f/u endocrinology, Ca 10.3 05/11/20, PTH 79 01/12/20. GDR of Lithium may help

## 2021-01-05 ENCOUNTER — Encounter: Payer: Self-pay | Admitting: Nurse Practitioner

## 2021-01-17 NOTE — Progress Notes (Signed)
Assessment/Plan:   1.  Parkinsonism  -DaTscan equivocal and felt to be essentially unremarkable per radiology.  They did mention mild asymmetry of intensity within the striate M with the right having less intensity than the left.  Her symptoms are mostly left-sided.  -Levodopa challenge test was done on January 19, 2021.  This was negative.  Patient was off of levodopa for almost 24 hours before the test and she agreed that she did not feel different being off of levodopa.  Levodopa will be discontinued.  -Patient was started on levodopa at the same time that her lithium was decreased.  She felt that the levodopa helped, but I always have wondered if it was just the decrease in the lithium that helped.    -Discussed with her again that lithium could be contributing to rest tremor.  Depakote was discontinued by Dr. Marijean Niemann yesterday.  2.  Bipolar disorder  -Suspect, as above, that she has lithium induced tremor.  Previously diagnosed with essential tremor for years, but that diagnosis cannot be made in the face of being on lithium.  She was treated in the past with primidone, but this is generally not effective for lithium induced tremor.  3.  Cervical dystonia  -She has previously received Botox for cervical dystonia.  She is not sure that it was helpful.  She has another injection in February.  She wants to wait to see if the trigger point injections done by Dr. Tommi Rumps are helpful.  He told her that it may be helpful for her cervical dystonia (that has not been my experience in the past, but we can certainly see).  -MRI cervical spine with advanced degenerative changes at C1-C2 with associated marrow edema and osteophytosis.  Patient understands that this is likely a source of significant neck pain, but not likely associated with dystonia.  Doubt that this is surgical in nature.  She declined referral to neurosx.  She is contemplating a referral to pain management for possible  injections.  She will let me know about that.  4.  Atypical headache syndrome  -Patient now following with Dr. Tommi Rumps at the headache clinic.  He just started her on Zonegran yesterday and she is scheduled to start trigger point injections on Monday   Subjective:   Tracey [name] was seen today in follow up for levodopa challenge.  She has had a negative DaTscan, but clinically she has looked parkinsonian and has felt that levodopa has helped.  The presence of lithium and Depakote have also created tremor in her.  She just started seeing Dr. Tommi Rumps at the headache clinic and I have reviewed the notes.  They are scheduled to start trigger point injections on Monday.  Zonegran has been started.  Depakote was discontinued.  Current prescribed movement disorder medications: Carbidopa/levodopa 25/100, 1 tablet 3 times per day -she does think it helps significantly.   PREVIOUS MEDICATIONS: primidone, 50 mg, 3 tablets daily (initially seemed to help); carbidopa/levodopa 10/100, 1 tablet 3 times per day  ALLERGIES:   Allergies  Allergen Reactions   Lisinopril Cough   Penicillins Itching    50 years ago   Adhesive [Tape] Itching   Atorvastatin Itching   Dilaudid [Hydromorphone Hcl] Itching   Hydromorphone Itching    CURRENT MEDICATIONS:  Outpatient Encounter Medications as of 01/19/2021  Medication Sig   zonisamide (ZONEGRAN) 50 MG capsule Take 50 mg by mouth at bedtime. Take 25 mg PO at night for one week then switch to  the 50 mg po at night   ALPRAZolam (XANAX) 0.5 MG tablet TAKE ONE TABLET BY MOUTH TWICE DAILY AS NEEDED FOR ANXIETY   aspirin 81 MG chewable tablet Chew by mouth daily.   carbidopa-levodopa (SINEMET IR) 25-100 MG tablet Take 1 tablet by mouth 3 (three) times daily. 7am/11am/4pm   clindamycin (CLEOCIN) 150 MG capsule Take 600mg  1 hour prior to dental procedure   diclofenac Sodium (VOLTAREN) 1 % GEL Apply 2 g topically 4 (four) times daily.   fexofenadine (ALLEGRA) 60 MG  tablet Take 60 mg by mouth 2 (two) times daily as needed.   hydrocortisone 1 % lotion Apply 1 application topically 2 (two) times daily.   latanoprost (XALATAN) 0.005 % ophthalmic solution INSTILL 1 DROP IN EACH EYE AT BEDTIME.   lithium carbonate 150 MG capsule TAKE 1 CAPSULE TWICE DAILY WITH MEALS.   loratadine (CLARITIN) 10 MG tablet Take 1 tablet (10 mg total) by mouth daily.   losartan (COZAAR) 50 MG tablet TAKE ONE TABLET BY MOUTH ONCE DAILY   MAGNESIUM PO Take by mouth.   Multiple Vitamin (MULTIVITAMIN) capsule Take 1 capsule by mouth daily.   omeprazole (PRILOSEC) 20 MG capsule TAKE (1) CAPSULE TWICE DAILY.   [DISCONTINUED] acetaminophen (TYLENOL) 325 MG tablet Take 650 mg by mouth every 4 (four) hours as needed. (Patient not taking: Reported on 01/19/2021)   [DISCONTINUED] divalproex (DEPAKOTE) 125 MG DR tablet Take 1 tablet (125 mg total) by mouth 2 (two) times daily. (Patient not taking: Reported on 01/19/2021)   Facility-Administered Encounter Medications as of 01/19/2021  Medication   Carbidopa-Levodopa ER (SINEMET CR) 25-100 MG tablet controlled release 2.5 tablet    Objective:   PHYSICAL EXAMINATION:    VITALS:   Vitals:   01/19/21 0939  BP: (!) 113/54  Pulse: (!) 49  SpO2: 99%  Weight: 147 lb 12.8 oz (67 kg)  Height: 5\' 4"  (1.626 m)     GEN:  The patient appears stated age and is in NAD. HEENT:  Normocephalic, atraumatic.  The mucous membranes are moist.   Neurological examination:  Orientation: The patient is alert and oriented x3. Cranial nerves: There is good facial symmetry with minimal facial hypomimia. The speech is fluent and clear. Soft palate rises symmetrically and there is no tongue deviation. Hearing is intact to conversational tone. Sensation: Sensation is intact to light touch throughout Motor: Strength is at least antigravity x4.  Levodopa challenge done today.  UPDRS motor off score was 23.  Pt then given 250 mg of levodopa dissolved in ginger ale  and waited 45minutes to re-examine her.  UPDRS motor on score was 22.  Details of UPDRS motor score documented on separate neurophysiologic worksheet.    Movement examination: Tone: There is no significant increased tone in the bilateral upper extremities Abnormal movements: No rest tremor at all today.  She has very minimal postural tremor bilaterally. Coordination:  There is mild decremation with RAM's, with any form of RAMS, including alternating supination and pronation of the forearm, hand opening and closing, finger taps, heel taps and toe taps on the left, both before and after levodopa administration. Gait and Station: The patient has no difficulty arising out of a deep-seated chair without the use of the hands. The patient's stride length is good but she does have left shoulder higher than the right.  She has marked decreased arm swing bilaterally, left greater than right.  This is true before and after levodopa administration.  Negative pull test before and after  levodopa administration.  I have reviewed and interpreted the following labs independently    Chemistry      Component Value Date/Time   NA 139 05/11/2020 0700   NA 139 01/26/2020 0000   NA 141 04/24/2012 0929   K 4.9 05/11/2020 0700   K 4.4 04/24/2012 0929   CL 107 05/11/2020 0700   CL 109 (H) 04/24/2012 0929   CO2 24 05/11/2020 0700   CO2 23 04/24/2012 0929   BUN 18 05/11/2020 0700   BUN 19 01/26/2020 0000   BUN 20.6 04/24/2012 0929   CREATININE 0.93 05/11/2020 0700   CREATININE 0.9 04/24/2012 0929   GLU 82 01/26/2020 0000      Component Value Date/Time   CALCIUM 10.3 05/11/2020 0700   CALCIUM 10.5 (H) 01/12/2020 2123   CALCIUM 10.0 04/24/2012 0929   ALKPHOS 81 01/26/2020 0000   ALKPHOS 75 04/24/2012 0929   AST 19 05/11/2020 0700   AST 20 04/24/2012 0929   ALT 13 05/11/2020 0700   ALT 12 04/24/2012 0929   BILITOT 0.5 05/11/2020 0700   BILITOT 0.36 04/24/2012 0929       Lab Results  Component Value  Date   WBC 7.1 01/26/2020   HGB 11.7 (A) 01/26/2020   HCT 35 (A) 01/26/2020   MCV 100.5 (H) 01/13/2020   PLT 294 01/26/2020    Lab Results  Component Value Date   TSH 2.734 01/12/2020     Total time spent on today's visit was 60 minutes, including both face-to-face time and nonface-to-face time.  Time included that spent on review of records (prior notes available to me/labs/imaging if pertinent), discussing treatment and goals, answering patient's questions and coordinating care.  Cc:  Mast, Man X, NP

## 2021-01-18 ENCOUNTER — Telehealth: Payer: Self-pay | Admitting: Neurology

## 2021-01-18 ENCOUNTER — Ambulatory Visit: Payer: Medicare PPO | Admitting: Neurology

## 2021-01-18 DIAGNOSIS — R519 Headache, unspecified: Secondary | ICD-10-CM | POA: Diagnosis not present

## 2021-01-18 DIAGNOSIS — Z049 Encounter for examination and observation for unspecified reason: Secondary | ICD-10-CM | POA: Diagnosis not present

## 2021-01-18 DIAGNOSIS — Z79899 Other long term (current) drug therapy: Secondary | ICD-10-CM | POA: Diagnosis not present

## 2021-01-18 NOTE — Telephone Encounter (Signed)
Make sure she is holding levodopa for tomorrows on/off test.  Also ask her to be here by 9:45

## 2021-01-18 NOTE — Telephone Encounter (Signed)
Called patient she is holding carbidopa levodopa and she has to arrange transportation so she is going to try to be here at 9:45am

## 2021-01-19 ENCOUNTER — Encounter: Payer: Self-pay | Admitting: Neurology

## 2021-01-19 ENCOUNTER — Other Ambulatory Visit: Payer: Self-pay

## 2021-01-19 ENCOUNTER — Ambulatory Visit: Payer: Medicare PPO | Admitting: Neurology

## 2021-01-19 VITALS — BP 113/54 | HR 49 | Ht 64.0 in | Wt 147.8 lb

## 2021-01-19 DIAGNOSIS — R251 Tremor, unspecified: Secondary | ICD-10-CM | POA: Diagnosis not present

## 2021-01-19 DIAGNOSIS — F319 Bipolar disorder, unspecified: Secondary | ICD-10-CM

## 2021-01-19 DIAGNOSIS — G20 Parkinson's disease: Secondary | ICD-10-CM

## 2021-01-19 DIAGNOSIS — G243 Spasmodic torticollis: Secondary | ICD-10-CM

## 2021-01-19 DIAGNOSIS — G4489 Other headache syndrome: Secondary | ICD-10-CM | POA: Diagnosis not present

## 2021-01-19 MED ORDER — CARBIDOPA-LEVODOPA ER 25-100 MG PO TBCR
2.5000 | EXTENDED_RELEASE_TABLET | Freq: Once | ORAL | Status: DC
Start: 2021-01-19 — End: 2021-04-12

## 2021-01-19 NOTE — Patient Instructions (Signed)
STOP carbidopa/levodopa 25/100  Let us know if you plan to keep feb botox visit.

## 2021-01-22 ENCOUNTER — Other Ambulatory Visit: Payer: Self-pay | Admitting: Nurse Practitioner

## 2021-01-22 DIAGNOSIS — M791 Myalgia, unspecified site: Secondary | ICD-10-CM | POA: Diagnosis not present

## 2021-01-22 DIAGNOSIS — M542 Cervicalgia: Secondary | ICD-10-CM | POA: Diagnosis not present

## 2021-01-22 DIAGNOSIS — G518 Other disorders of facial nerve: Secondary | ICD-10-CM | POA: Diagnosis not present

## 2021-01-22 DIAGNOSIS — R519 Headache, unspecified: Secondary | ICD-10-CM | POA: Diagnosis not present

## 2021-01-22 NOTE — Telephone Encounter (Signed)
Patient has request refill on medication "Xanax 0.5mg ". Patient last refill on medication was 11/27/2020. Patient has No Contract on File. Patient upcoming appointment dated 04/12/2021. "Sign Contract" added to patient appointment notes. Medication pend and sent to PCP Mast, Man X, NP for approval. Please Advise.

## 2021-01-26 DIAGNOSIS — H401113 Primary open-angle glaucoma, right eye, severe stage: Secondary | ICD-10-CM | POA: Diagnosis not present

## 2021-01-26 DIAGNOSIS — H401121 Primary open-angle glaucoma, left eye, mild stage: Secondary | ICD-10-CM | POA: Diagnosis not present

## 2021-01-31 ENCOUNTER — Telehealth: Payer: Self-pay

## 2021-01-31 NOTE — Telephone Encounter (Signed)
New message   Karrington Uliano Key: [id number] - PA Case ID: [id number] Need help? Call us at [phone] Outcome Additional Information Required There is an existing case within the [facility] environment that has the same patient, prescriber, and drug. This case must be finalized before proceeding with similar requests. Drug Botox 100UNIT solution Form Nurse, adult and Medical Benefit PA Form

## 2021-01-31 NOTE — Telephone Encounter (Signed)
Website [facility]   Benefit Verification [id number] Submitted! For BV Basic submissions, please allow 2-4 business days for results.  For BV Full submissions, please allow 6 business days for results.

## 2021-02-02 ENCOUNTER — Other Ambulatory Visit: Payer: Self-pay | Admitting: Nurse Practitioner

## 2021-02-07 ENCOUNTER — Telehealth: Payer: Self-pay

## 2021-02-07 DIAGNOSIS — G518 Other disorders of facial nerve: Secondary | ICD-10-CM | POA: Diagnosis not present

## 2021-02-07 DIAGNOSIS — M791 Myalgia, unspecified site: Secondary | ICD-10-CM | POA: Diagnosis not present

## 2021-02-07 DIAGNOSIS — R519 Headache, unspecified: Secondary | ICD-10-CM | POA: Diagnosis not present

## 2021-02-07 DIAGNOSIS — M542 Cervicalgia: Secondary | ICD-10-CM | POA: Diagnosis not present

## 2021-02-07 NOTE — Telephone Encounter (Signed)
Called Patient  she is sorry for the misunderstanding but wants to proceed with her injections for Botox on Feb 03 rd if can still do it ?

## 2021-02-08 NOTE — Telephone Encounter (Signed)
F/u   I called the patient at [phone].    Advise I will work on PA for her and that her appt will be rescheduled.   Schedule date with Dr. Carles Collet 02/16/2021.  Reschedule appt to 03/16/2021.  The patient verbalized understanding.

## 2021-02-08 NOTE — Telephone Encounter (Signed)
F/u   Fax prior authorization to [facility] at  [phone]  Attached clinical notes

## 2021-02-15 NOTE — Telephone Encounter (Signed)
BOTOX (onabotulinumtoxinA)  ACQUISITION - MAJOR MEDICAL BENEFITS List of Specialty Pharmacies may not include all available options. If preferred Specialty Pharmacy is not listed, check with payer.  [ ] Buy and Bill [?]Buy and Bill OR Specialty Pharmacy Available [ ] Specialty Pharmacy Required [ ] Prescription Coverage Only - See Pharmacy Benefits Section [ ] Payer will not release information Specialty Pharmacies: [facility] [phone]   COVERAGE DETAILS - MAJOR MEDICAL BENEFITS The plan's renewal date is 01/14/2022. This plan runs on a calendar year basis. Under medical, the above referenced payer is unable to confirm the maximum number of units allowed under the medical benefits. Please contact the payer by calling [phone]. The payer advised the medical out-of-pocket max does not apply to procedure code 64615. Once the medical individual out of pocket maximum has been met, the patient responsibility will be 0%. For additional payer coverage criteria, please reference the BOTOX Policies and Forms link available at [email or web address]. 

## 2021-02-16 ENCOUNTER — Ambulatory Visit: Payer: Medicare PPO | Admitting: Neurology

## 2021-02-20 ENCOUNTER — Telehealth: Payer: Self-pay

## 2021-02-20 NOTE — Telephone Encounter (Signed)
New message   Call [place] for update on botox benefit verification .   Patient has copayment $ 128.00 and consent will need to be on file for 2023.  Delivery date 2.8.23

## 2021-02-21 DIAGNOSIS — M542 Cervicalgia: Secondary | ICD-10-CM | POA: Diagnosis not present

## 2021-02-21 DIAGNOSIS — G518 Other disorders of facial nerve: Secondary | ICD-10-CM | POA: Diagnosis not present

## 2021-02-21 DIAGNOSIS — M791 Myalgia, unspecified site: Secondary | ICD-10-CM | POA: Diagnosis not present

## 2021-02-21 DIAGNOSIS — R519 Headache, unspecified: Secondary | ICD-10-CM | POA: Diagnosis not present

## 2021-02-26 ENCOUNTER — Other Ambulatory Visit: Payer: Self-pay | Admitting: Nurse Practitioner

## 2021-02-26 NOTE — Telephone Encounter (Signed)
Pharmacy requested refills.  Pended Rx's and sent to [facility] for approval.  Epic LR: 01/22/2021

## 2021-03-07 DIAGNOSIS — R519 Headache, unspecified: Secondary | ICD-10-CM | POA: Diagnosis not present

## 2021-03-07 DIAGNOSIS — G518 Other disorders of facial nerve: Secondary | ICD-10-CM | POA: Diagnosis not present

## 2021-03-07 DIAGNOSIS — M542 Cervicalgia: Secondary | ICD-10-CM | POA: Diagnosis not present

## 2021-03-07 DIAGNOSIS — M791 Myalgia, unspecified site: Secondary | ICD-10-CM | POA: Diagnosis not present

## 2021-03-13 ENCOUNTER — Telehealth: Payer: Self-pay | Admitting: *Deleted

## 2021-03-13 NOTE — Telephone Encounter (Signed)
Patient called and stated that she has to take Clindamycin before she has Dental appointment.  Patient is wondering if you would call this in for her. The dental office use to send to pharmacy but doesn't do it anymore. Her appointment is 03/26/2021  Pended Rx and sent to [facility] for approval.

## 2021-03-16 ENCOUNTER — Ambulatory Visit: Payer: Medicare PPO | Admitting: Neurology

## 2021-03-16 ENCOUNTER — Other Ambulatory Visit: Payer: Self-pay

## 2021-03-16 DIAGNOSIS — G243 Spasmodic torticollis: Secondary | ICD-10-CM | POA: Diagnosis not present

## 2021-03-16 NOTE — Procedures (Signed)
Botulinum Clinic  ? ?Procedure Note Botox ? ?Attending: Dr. Wells Guiles Carlina Derks ? ?Preoperative Diagnosis(es): Cervical Dystonia ? ?Result History  ?Helped some, esp "shoulder injections" ? ?Consent obtained from: The patient ?Benefits discussed included, but were not limited to decreased muscle tightness, increased joint range of motion, and decreased pain.  Risk discussed included, but were not limited pain and discomfort, bleeding, bruising, excessive weakness, venous thrombosis, muscle atrophy and dysphagia.  A copy of the patient medication guide was given to the patient which explains the blackbox warning. ? ?Patients identity and treatment sites confirmed Yes.  . ? ?Details of Procedure: ?Skin was cleaned with alcohol.  A 30 gauge, 1mm  needle was introduced to the target muscle, except for posterior splenius where 27 gauge, 1.5 inch needle used.   Prior to injection, the needle plunger was aspirated to make sure the needle was not within a blood vessel.  There was no blood retrieved on aspiration.   ? ?Following is a summary of the muscles injected  And the amount of Botulinum toxin used: ? ? ?Dilution ?0.9% preservative free saline mixed with 100 u Botox type A to make 10 U per 0.1cc ? ?Injections  ?Location Left  Right Units Number of sites  ?      ?Sternocleidomastoid 60  60 1  ?Splenius Capitus, posterior approach  100 100 1  ?Splenius Capitus, lateral approach  40 40 1  ?Levator Scapulae      ?Trapezius 20/20 20/20 80 4  ?L cervical paraspinal C5 20  20 1   ?TOTAL UNITS:   280   ?  ?Agent: Botulinum Type A ( Onobotulinum Toxin type A ).  3 vials of Botox were used, each containing 100 units and freshly diluted with 1 mL of sterile, non-preserved saline ? ? Total injected (Units): 300 ? Total wasted (Units): 0 ? ? ?Pt tolerated procedure well without complications.   ?Reinjection is anticipated in 3 months. ? ?

## 2021-03-21 ENCOUNTER — Telehealth: Payer: Self-pay | Admitting: Neurology

## 2021-03-21 NOTE — Telephone Encounter (Signed)
Patient called and left a voice mail requesting a call back. ? ?She said she is looking for a new doctor to help with her headaches, is this something Dr. Carles Collet can help her with? ?

## 2021-03-23 NOTE — Telephone Encounter (Signed)
Called patient unable to reach left voicemail message  

## 2021-03-28 ENCOUNTER — Telehealth: Payer: Self-pay | Admitting: Neurology

## 2021-03-28 ENCOUNTER — Other Ambulatory Visit: Payer: Self-pay

## 2021-03-28 DIAGNOSIS — G4486 Cervicogenic headache: Secondary | ICD-10-CM

## 2021-03-28 DIAGNOSIS — M542 Cervicalgia: Secondary | ICD-10-CM

## 2021-03-28 DIAGNOSIS — G20 Parkinson's disease: Secondary | ICD-10-CM

## 2021-03-28 DIAGNOSIS — G243 Spasmodic torticollis: Secondary | ICD-10-CM

## 2021-03-28 NOTE — Telephone Encounter (Signed)
Patient called and stated when she was here last the Dr had told her about some headache drs she could see.  She was following up, her headaches have gotten worse. ?

## 2021-03-28 NOTE — Telephone Encounter (Signed)
Called pateint and sent in referral to [facility] for this pateint  ?

## 2021-04-02 ENCOUNTER — Other Ambulatory Visit: Payer: Self-pay

## 2021-04-02 ENCOUNTER — Telehealth: Payer: Self-pay | Admitting: Neurology

## 2021-04-02 NOTE — Telephone Encounter (Signed)
Patient called and stated she received a phone call for an appointment for pain management, she is not sure where it is.  She was just calling to see if maybe our office could tell her anything about it. ?

## 2021-04-02 NOTE — Telephone Encounter (Signed)
Patient called stating she is returning a call to [place]. ?

## 2021-04-02 NOTE — Telephone Encounter (Signed)
Called patient and she is going to wait for the pain clinic to call her to schedule  ?

## 2021-04-03 ENCOUNTER — Telehealth: Payer: Self-pay | Admitting: Neurology

## 2021-04-03 NOTE — Telephone Encounter (Signed)
Patient reached out to botox on information about treatment for Cervical Dystonia. They sent her back possible medications to try for example ?Artane , Cogenta , Baclofen ?

## 2021-04-03 NOTE — Telephone Encounter (Signed)
Left message with the after hour service at 1:00 pm on 04-03-21  ? ? ?She would like to speak with [facility] about a referral   ?

## 2021-04-04 ENCOUNTER — Other Ambulatory Visit: Payer: Self-pay

## 2021-04-04 DIAGNOSIS — R29898 Other symptoms and signs involving the musculoskeletal system: Secondary | ICD-10-CM | POA: Diagnosis not present

## 2021-04-04 DIAGNOSIS — M542 Cervicalgia: Secondary | ICD-10-CM

## 2021-04-04 DIAGNOSIS — G4486 Cervicogenic headache: Secondary | ICD-10-CM

## 2021-04-04 DIAGNOSIS — M47812 Spondylosis without myelopathy or radiculopathy, cervical region: Secondary | ICD-10-CM

## 2021-04-04 DIAGNOSIS — G249 Dystonia, unspecified: Secondary | ICD-10-CM | POA: Diagnosis not present

## 2021-04-04 DIAGNOSIS — R937 Abnormal findings on diagnostic imaging of other parts of musculoskeletal system: Secondary | ICD-10-CM

## 2021-04-04 NOTE — Telephone Encounter (Signed)
Pt called in returning Chelsea's call ?

## 2021-04-04 NOTE — Telephone Encounter (Signed)
Called patient and left voicemail regarding medications she  was asking about as well as the pain management referral had to be changed to [facility]  ?

## 2021-04-04 NOTE — Telephone Encounter (Signed)
Called patient and left another voicemail  ?

## 2021-04-04 NOTE — Telephone Encounter (Signed)
Patient was returning call from the voicemail. ?

## 2021-04-05 NOTE — Telephone Encounter (Signed)
Pt called in and left a message returning Chelsea's call 

## 2021-04-05 NOTE — Telephone Encounter (Signed)
Called patient and she is already scheduled for [facility] ?

## 2021-04-09 DIAGNOSIS — G243 Spasmodic torticollis: Secondary | ICD-10-CM | POA: Diagnosis not present

## 2021-04-09 DIAGNOSIS — F304 Manic episode in full remission: Secondary | ICD-10-CM | POA: Diagnosis not present

## 2021-04-09 DIAGNOSIS — I10 Essential (primary) hypertension: Secondary | ICD-10-CM | POA: Diagnosis not present

## 2021-04-09 DIAGNOSIS — R4189 Other symptoms and signs involving cognitive functions and awareness: Secondary | ICD-10-CM | POA: Diagnosis not present

## 2021-04-10 ENCOUNTER — Other Ambulatory Visit: Payer: Self-pay

## 2021-04-10 ENCOUNTER — Other Ambulatory Visit: Payer: Medicare PPO

## 2021-04-10 DIAGNOSIS — R29898 Other symptoms and signs involving the musculoskeletal system: Secondary | ICD-10-CM | POA: Diagnosis not present

## 2021-04-10 DIAGNOSIS — G249 Dystonia, unspecified: Secondary | ICD-10-CM | POA: Diagnosis not present

## 2021-04-10 DIAGNOSIS — G243 Spasmodic torticollis: Secondary | ICD-10-CM

## 2021-04-10 DIAGNOSIS — R4189 Other symptoms and signs involving cognitive functions and awareness: Secondary | ICD-10-CM

## 2021-04-10 DIAGNOSIS — M542 Cervicalgia: Secondary | ICD-10-CM | POA: Diagnosis not present

## 2021-04-10 DIAGNOSIS — I10 Essential (primary) hypertension: Secondary | ICD-10-CM

## 2021-04-10 DIAGNOSIS — F304 Manic episode in full remission: Secondary | ICD-10-CM

## 2021-04-11 DIAGNOSIS — G249 Dystonia, unspecified: Secondary | ICD-10-CM | POA: Diagnosis not present

## 2021-04-11 DIAGNOSIS — M542 Cervicalgia: Secondary | ICD-10-CM | POA: Diagnosis not present

## 2021-04-11 DIAGNOSIS — R29898 Other symptoms and signs involving the musculoskeletal system: Secondary | ICD-10-CM | POA: Diagnosis not present

## 2021-04-11 LAB — LIPID PANEL
Cholesterol: 271 mg/dL — ABNORMAL HIGH (ref <200)
HDL: 67 mg/dL (ref >=50)
LDL Cholesterol (Calc): 166 mg/dL (calc) — ABNORMAL HIGH
Non-HDL Cholesterol (Calc): 204 mg/dL (calc) — ABNORMAL HIGH (ref <130)
Total CHOL/HDL Ratio: 4 (calc) (ref <5.0)
Triglycerides: 213 mg/dL — ABNORMAL HIGH (ref <150)

## 2021-04-11 LAB — COMPLETE METABOLIC PANEL WITH GFR
AG Ratio: 1.9 (calc) (ref 1.0–2.5)
ALT: 11 U/L (ref 6–29)
AST: 20 U/L (ref 10–35)
Albumin: 4.3 g/dL (ref 3.6–5.1)
Alkaline phosphatase (APISO): 96 U/L (ref 37–153)
BUN/Creatinine Ratio: 21 (calc) (ref 6–22)
BUN: 21 mg/dL (ref 7–25)
CO2: 24 mmol/L (ref 20–32)
Calcium: 10.7 mg/dL — ABNORMAL HIGH (ref 8.6–10.4)
Chloride: 109 mmol/L (ref 98–110)
Creat: 0.99 mg/dL — ABNORMAL HIGH (ref 0.60–0.95)
Globulin: 2.3 g/dL (calc) (ref 1.9–3.7)
Glucose, Bld: 93 mg/dL (ref 65–99)
Potassium: 4.7 mmol/L (ref 3.5–5.3)
Sodium: 141 mmol/L (ref 135–146)
Total Bilirubin: 0.6 mg/dL (ref 0.2–1.2)
Total Protein: 6.6 g/dL (ref 6.1–8.1)
eGFR: 58 mL/min/{1.73_m2} — ABNORMAL LOW (ref >=60)

## 2021-04-11 LAB — CBC WITH DIFFERENTIAL/PLATELET
Absolute Monocytes: 423 cells/uL (ref 200–950)
Basophils Absolute: 59 cells/uL (ref 0–200)
Basophils Relative: 0.9 %
Eosinophils Absolute: 182 cells/uL (ref 15–500)
Eosinophils Relative: 2.8 %
HCT: 38.6 % (ref 35.0–45.0)
Hemoglobin: 12.8 g/dL (ref 11.7–15.5)
Lymphs Abs: 1352 cells/uL (ref 850–3900)
MCH: 32.4 pg (ref 27.0–33.0)
MCHC: 33.2 g/dL (ref 32.0–36.0)
MCV: 97.7 fL (ref 80.0–100.0)
MPV: 9.6 fL (ref 7.5–12.5)
Monocytes Relative: 6.5 %
Neutro Abs: 4485 cells/uL (ref 1500–7800)
Neutrophils Relative %: 69 %
Platelets: 306 10*3/uL (ref 140–400)
RBC: 3.95 10*6/uL (ref 3.80–5.10)
RDW: 12.1 % (ref 11.0–15.0)
Total Lymphocyte: 20.8 %
WBC: 6.5 10*3/uL (ref 3.8–10.8)

## 2021-04-11 LAB — VALPROIC ACID LEVEL: Valproic Acid Lvl: <12.5 mg/L — ABNORMAL LOW (ref 50.0–100.0)

## 2021-04-11 LAB — TSH: TSH: 2.08 mIU/L (ref 0.40–4.50)

## 2021-04-11 NOTE — Telephone Encounter (Signed)
-----   Message from [place], DO sent at 03/16/2021  2:40 PM EST ----- ?Call patient and find out how she did with last botox injections and if she wants to continue ? ?

## 2021-04-12 ENCOUNTER — Encounter: Payer: Self-pay | Admitting: Nurse Practitioner

## 2021-04-12 ENCOUNTER — Other Ambulatory Visit: Payer: Self-pay | Admitting: Nurse Practitioner

## 2021-04-12 ENCOUNTER — Non-Acute Institutional Stay: Payer: Medicare PPO | Admitting: Nurse Practitioner

## 2021-04-12 DIAGNOSIS — M159 Polyosteoarthritis, unspecified: Secondary | ICD-10-CM

## 2021-04-12 DIAGNOSIS — H532 Diplopia: Secondary | ICD-10-CM

## 2021-04-12 DIAGNOSIS — E785 Hyperlipidemia, unspecified: Secondary | ICD-10-CM

## 2021-04-12 DIAGNOSIS — R4189 Other symptoms and signs involving cognitive functions and awareness: Secondary | ICD-10-CM

## 2021-04-12 DIAGNOSIS — R001 Bradycardia, unspecified: Secondary | ICD-10-CM

## 2021-04-12 DIAGNOSIS — G243 Spasmodic torticollis: Secondary | ICD-10-CM

## 2021-04-12 DIAGNOSIS — K5901 Slow transit constipation: Secondary | ICD-10-CM

## 2021-04-12 DIAGNOSIS — M150 Primary generalized (osteo)arthritis: Secondary | ICD-10-CM

## 2021-04-12 DIAGNOSIS — I83892 Varicose veins of left lower extremities with other complications: Secondary | ICD-10-CM

## 2021-04-12 DIAGNOSIS — K219 Gastro-esophageal reflux disease without esophagitis: Secondary | ICD-10-CM

## 2021-04-12 DIAGNOSIS — G20A1 Parkinson's disease without dyskinesia, without mention of fluctuations: Secondary | ICD-10-CM

## 2021-04-12 DIAGNOSIS — I10 Essential (primary) hypertension: Secondary | ICD-10-CM

## 2021-04-12 DIAGNOSIS — F304 Manic episode in full remission: Secondary | ICD-10-CM

## 2021-04-12 DIAGNOSIS — G20 Parkinson's disease: Secondary | ICD-10-CM

## 2021-04-12 MED ORDER — ALPRAZOLAM 0.5 MG PO TABS: 0.5000 mg | ORAL_TABLET | Freq: Two times a day (BID) | ORAL | 0 refills | Status: DC | PRN

## 2021-04-12 NOTE — Patient Instructions (Signed)
Refer to vascular specialist 

## 2021-04-12 NOTE — Assessment & Plan Note (Signed)
Prn Metamucil, MiraLax qd are effective.  

## 2021-04-12 NOTE — Assessment & Plan Note (Signed)
,   f/u endocrinology, Ca 10.7 04/09/21,  PTH 79 01/12/20. GDR of Lithium may help ?

## 2021-04-12 NOTE — Assessment & Plan Note (Signed)
takes  Tylenol, s/p R knee arthroplasty. Ambulates with walker.  ?

## 2021-04-12 NOTE — Assessment & Plan Note (Signed)
,   stable, on Lithium, GDR due to bradycardia, takes Alprazolam too, Zonisamide as well, TSH 2.08 04/09/21 ?

## 2021-04-12 NOTE — Assessment & Plan Note (Signed)
stable(diplopia R+L lateral,  tremor in fingers much improved), on Sinemet f/u Neurology.  ?

## 2021-04-12 NOTE — Assessment & Plan Note (Addendum)
Cervical dystonia, no better, s/p  inj Botox inj, f/u neurology, pain clinic pending 04/24/21, cervical degenerative changes/dystonia, CT/MRI 12/2019 ruled out acute process, aches in neck R>L travels to the back of head, comes and goes, not disabling, f/u Neurosurgery. Failed Cymbalta, Gabapentin, Robaxin, Ultracet. s/p Botulinum Toxin inj by Neurology. ESR 9, CRP 2.7 10/09/20. The patient stated Alprazolam and Tylenol are kind of effective. ?

## 2021-04-12 NOTE — Assessment & Plan Note (Signed)
Functioning well in [place] [facility], 12/2019 MRI brain showed chronic microvascular ischemic changes, no acute disease with mild cortical atrophy ?

## 2021-04-12 NOTE — Progress Notes (Signed)
? ?Location:   clinic [place] ?  ?Place of Service:  Clinic (12) ?Provider: Marlana Latus NP ? ?Code Status: DNR ?Goals of Care: [place] ? ?  01/04/2021  ?  9:34 AM  ?Advanced Directives  ?Does Patient Have a Medical Advance Directive? Yes  ?Type of Paramedic of [place];Living will  ?Does patient want to make changes to medical advance directive? No - Patient declined  ?Copy of [place] in Chart? Yes - validated most recent copy scanned in chart (See row information)  ? ? ? ?Chief Complaint  ?Patient presents with  ?? Medical Management of Chronic Issues  ?  Patient presents today for a 3 month follow-up.  ? ? ?HPI: Patient is a 81 y.o. female seen today for medical management of chronic diseases.   ?  Edema, LLE negative DVT venous US, L>R. Medial thickened swelling area about her palm sized, mild warmth, redness, and indurated area for a few month. Varicose veins LLE not new.  ?Tinnitus, underwent eval of  ENT, suggested music background.  ?            Hx of GIST of small intestine s/p resection. prn MiraLax. Daily Psyllium . ?            Constipation. Prn Metamucil, MiraLax qd are effective.  ?            Stable(diplopia R+L lateral,  tremor in fingers much improved), on Sinemet f/u Neurology.  ?            GERD, stable, on Omeprazole, Hgb 12.8 04/09/21 ?            HTN, blood pressure is controlled on Losartan ?            Bipolar disorder, stable, on Lithium, GDR due to bradycardia, takes Alprazolam too, TSH 2.08 04/09/21 ?            Bradycardia, improved from 40s to 50ss. Hx of normalized HR after Tylenol PM dc'd, GDR of Lithium helped too.  ?            Hypercalcemia, f/u endocrinology, Ca 10.7 04/09/21,  PTH 79 01/12/20. GDR of Lithium may help ?            OA, takes  Tylenol, s/p R knee arthroplasty.  ?            Cervical dystonia, no better, s/p  inj Botox inj, f/u neurology, pain clinic. No longer taking Zongran and stopped going to [facility] clinic. No Parkinson's disease,  took her off Sinemet per Neurology.  ?Cervical degenerative changes/dystonia, CT/MRI 12/2019 ruled out acute process, aches in neck R>L travels to the back of head, comes and goes, not disabling, f/u Neurosurgery. Failed Cymbalta, Gabapentin, Robaxin, Ultracet. s/p Botulinum Toxin inj by Neurology. ESR 9, CRP 2.7 10/09/20. The patient stated Alprazolam and Tylenol are kind of effective.  ?            Cognitive impairment: 12/2019 MRI brain showed chronic microvascular ischemic changes, no acute disease with mild cortical atrophy ? Hyperlipidemia, LDL 166 04/09/21, on diet, allergic to statin.  ? ? ?Past Medical History:  ?Diagnosis Date  ?? Anxiety   ?? Arthritis   ?? Cardiac conduction disorder 03/30/2012  ? Overview:  STORY: ETT 03/09/2012 Echo 03/07/2012 normal Dr Einar Gip, bradycardia felt due to glaucoma eye drops  ?? Diverticulosis of colon   ?? GERD (gastroesophageal reflux disease)   ?? GIST (gastrointestinal stroma tumor), malignant,  colon ([place])   ?? Heart murmur   ?? History of colon polyps 10/24/2008  ?? Hypertension   ?? Major neurocognitive disorder due to Parkinson's disease, possible   ? Tremors possible parkinsons  ?? Manic disorder, single episode, in full remission ([place]) 12/01/2009  ?? Sixth nerve palsy   ? ? ?Past Surgical History:  ?Procedure Laterality Date  ?? BILATERAL SALPINGOOPHORECTOMY  09/22/2007  ?? CATARACT EXTRACTION Bilateral   ?? Gastrointestinal Stroma Tumor,  Other  1960  ? GIST Surgery  ?? ILEOCECETOMY  09/22/2007  ?? OVARIAN CYST REMOVAL Right 1967  ?? SMALL INTESTINE SURGERY  09/22/2007  ?? TOTAL KNEE ARTHROPLASTY Right 10/05/2019  ? Procedure: RIGHT TOTAL KNEE ARTHROPLASTY;  Surgeon: Meredith Pel, MD;  Location: [place];  Service: Orthopedics;  Laterality: Right;  ?? TOTAL VAGINAL HYSTERECTOMY  1986  ? Fibroids  ? ? ?Allergies  ?Allergen Reactions  ?? Lisinopril Cough  ?? Penicillins Itching  ?  50 years ago  ?? Adhesive [Tape] Itching  ?? Atorvastatin Itching  ?? Dilaudid  [Hydromorphone Hcl] Itching  ?? Hydromorphone Itching  ? ? ?Allergies as of 04/12/2021   ? ?   Reactions  ? Lisinopril Cough  ? Penicillins Itching  ? 50 years ago  ? Adhesive [tape] Itching  ? Atorvastatin Itching  ? Dilaudid [hydromorphone Hcl] Itching  ? Hydromorphone Itching  ? ?  ? ?  ?Medication List  ?  ? ?  ? Accurate as of April 12, 2021 11:59 PM. If you have any questions, ask your nurse or doctor.  ?  ?  ? ?  ? ?STOP taking these medications   ? ?diclofenac Sodium 1 % Gel ?Commonly known as: Voltaren ?Stopped by: Ascencion Stegner X Noreene Boreman, NP ?  ?hydrocortisone 1 % lotion ?Stopped by: Leanthony Rhett X River Mckercher, NP ?  ?zonisamide 50 MG capsule ?Commonly known as: ZONEGRAN ?Stopped by: Jassen Sarver X Clodagh Odenthal, NP ?  ? ?  ? ?TAKE these medications   ? ?ALPRAZolam 0.5 MG tablet ?Commonly known as: Duanne Moron ?Take 1 tablet (0.5 mg total) by mouth 2 (two) times daily as needed. for anxiety ?  ?aspirin 81 MG chewable tablet ?Chew by mouth daily. ?  ?clindamycin 150 MG capsule ?Commonly known as: CLEOCIN ?Take $RemoveBefor'600mg'[id number]$  1 hour prior to dental procedure ?  ?fexofenadine 60 MG tablet ?Commonly known as: ALLEGRA ?Take 60 mg by mouth 2 (two) times daily as needed. ?  ?latanoprost 0.005 % ophthalmic solution ?Commonly known as: XALATAN ?INSTILL 1 DROP IN [facility] EYE AT BEDTIME. ?  ?lithium carbonate 150 MG capsule ?TAKE 1 CAPSULE TWICE DAILY WITH MEALS. ?  ?loratadine 10 MG tablet ?Commonly known as: Claritin ?Take 1 tablet (10 mg total) by mouth daily. ?  ?losartan 50 MG tablet ?Commonly known as: COZAAR ?TAKE ONE TABLET BY MOUTH ONCE DAILY ?  ?MAGNESIUM PO ?Take by mouth. ?  ?multivitamin capsule ?Take 1 capsule by mouth daily. ?  ?omeprazole 20 MG capsule ?Commonly known as: PRILOSEC ?TAKE (1) CAPSULE TWICE DAILY. ?  ? ?  ? ? ?Review of Systems:  ?Review of Systems  ?Constitutional:  Positive for unexpected weight change. Negative for appetite change, fatigue and fever.  ?     Weight loss about #7-8Ibs since on the HA diet.   ?HENT:  Positive for hearing loss and  tinnitus. Negative for rhinorrhea and trouble swallowing.   ?Eyes:  Negative for visual disturbance.  ?     Glaucoma, diplopia R+L lateral peripheral visual fields only.   ?Respiratory:  Negative for shortness of breath.   ?     Occasionally DOE, hacking cough  ?Cardiovascular:  Positive for leg swelling.  ?Gastrointestinal:  Negative for abdominal pain and constipation.  ?Genitourinary:  Positive for frequency. Negative for dysuria and urgency.  ?     Couple of times at night, no difficulty of returning asleep.   ?Musculoskeletal:  Positive for arthralgias and gait problem.  ?     Walker.   ?Skin:  Negative for color change.  ?Neurological:  Positive for headaches. Negative for tremors, speech difficulty and light-headedness.  ?     Tremors in hands/fingers at rest. L>R, near resolved,  comes and goes nature of the neck/occipital pain. Stiff neck. Pain the patent right knee, s/p TKR. Tremor is better since Sinemet.   ?Psychiatric/Behavioral:  Positive for sleep disturbance. Negative for confusion. The patient is nervous/anxious.   ?     Relapsed mood, feels anxious or depressive mood.   ? ?Health Maintenance  ?Topic Date Due  ?? COVID-19 Vaccine (4 - Booster for Moderna series) 08/08/2020  ?? TETANUS/TDAP  08/19/2021  ?? Pneumonia Vaccine 24+ Years old  Completed  ?? INFLUENZA VACCINE  Completed  ?? DEXA SCAN  Completed  ?? Zoster Vaccines- Shingrix  Completed  ?? HPV VACCINES  Aged Out  ? ? ?Physical Exam: ?Vitals:  ? 04/12/21 1334  ?BP: 118/78  ?Pulse: 60  ?Temp: 98 ?F (36.7 ?C)  ?SpO2: 99%  ?Weight: 140 lb (63.5 kg)  ?Height: $RemoveB'5\' 4"'ihiHFXJY$  (1.626 m)  ? ?Body mass index is 24.03 kg/m?Marland Kitchen ?Physical Exam ?Vitals and nursing note reviewed.  ?Constitutional:   ?   Appearance: Normal appearance.  ?HENT:  ?   Head: Normocephalic and atraumatic.  ?   Nose: Nose normal.  ?   Mouth/Throat:  ?   Mouth: Mucous membranes are moist.  ?Eyes:  ?   Extraocular Movements: Extraocular movements intact.  ?   Conjunctiva/sclera: Conjunctivae  normal.  ?   Pupils: Pupils are equal, round, and reactive to light.  ?Cardiovascular:  ?   Rate and Rhythm: Regular rhythm. Bradycardia present.  ?   Heart sounds: Murmur heard.  ?   Comments: DP pulses present R+L

## 2021-04-12 NOTE — Assessment & Plan Note (Signed)
LDL 166 on diet, allergic to statin.  ?

## 2021-04-12 NOTE — Telephone Encounter (Signed)
Called patient and left voicemail message.

## 2021-04-12 NOTE — Assessment & Plan Note (Addendum)
LLE negative DVT venous US, L>R. Medial thickened swelling area about her palm sized, mild warmth, redness, and indurated area for a few month. Varicose veins LLE not new. referral to vascular specialist.  ?

## 2021-04-12 NOTE — Assessment & Plan Note (Signed)
Long standing, no change.  ?

## 2021-04-12 NOTE — Assessment & Plan Note (Signed)
improved from 40s to 50ss. Hx of normalized HR after Tylenol PM dc'd, GDR of Lithium helped too.  

## 2021-04-12 NOTE — Assessment & Plan Note (Signed)
blood pressure is controlled on Losartan 

## 2021-04-12 NOTE — Telephone Encounter (Signed)
Pharmacy requested refill.  Pended Rx and sent to [facility] for approval.  

## 2021-04-13 ENCOUNTER — Encounter: Payer: Self-pay | Admitting: Nurse Practitioner

## 2021-04-13 DIAGNOSIS — M542 Cervicalgia: Secondary | ICD-10-CM | POA: Diagnosis not present

## 2021-04-13 DIAGNOSIS — R29898 Other symptoms and signs involving the musculoskeletal system: Secondary | ICD-10-CM | POA: Diagnosis not present

## 2021-04-13 DIAGNOSIS — G249 Dystonia, unspecified: Secondary | ICD-10-CM | POA: Diagnosis not present

## 2021-04-13 NOTE — Assessment & Plan Note (Signed)
stable, on Omeprazole, Hgb 12.8 04/09/21 

## 2021-04-16 ENCOUNTER — Telehealth: Payer: Self-pay | Admitting: Neurology

## 2021-04-16 NOTE — Telephone Encounter (Signed)
Called pateint back LVM ?

## 2021-04-16 NOTE — Telephone Encounter (Signed)
[facility] called in about the referral they received.  Dr. Ella Bodo cannot accept the referral. He suggested to sent to Dr. Ernestina Patches [phone] or Dr. Davy Pique. ?

## 2021-04-16 NOTE — Telephone Encounter (Signed)
Patient returned call and will be available around 11am after an exercise class. ?

## 2021-04-17 NOTE — Telephone Encounter (Signed)
Patient returned call to [facility], patient will be available all day today. ?

## 2021-04-17 NOTE — Telephone Encounter (Signed)
Called pateint back and let her know  ?

## 2021-04-17 NOTE — Telephone Encounter (Signed)
Called patient she said she can tell a huge difference in her neck and said it has helped tremendously. She has appointment at [facility] on the 11th. Patient asking if there is anything prescription wise that may help with he headache she said she has had a horrible headache for weeks now  ?

## 2021-04-18 DIAGNOSIS — R29898 Other symptoms and signs involving the musculoskeletal system: Secondary | ICD-10-CM | POA: Diagnosis not present

## 2021-04-18 DIAGNOSIS — M542 Cervicalgia: Secondary | ICD-10-CM | POA: Diagnosis not present

## 2021-04-18 DIAGNOSIS — G249 Dystonia, unspecified: Secondary | ICD-10-CM | POA: Diagnosis not present

## 2021-04-19 DIAGNOSIS — M542 Cervicalgia: Secondary | ICD-10-CM | POA: Diagnosis not present

## 2021-04-19 DIAGNOSIS — G249 Dystonia, unspecified: Secondary | ICD-10-CM | POA: Diagnosis not present

## 2021-04-19 DIAGNOSIS — R29898 Other symptoms and signs involving the musculoskeletal system: Secondary | ICD-10-CM | POA: Diagnosis not present

## 2021-04-24 DIAGNOSIS — M47812 Spondylosis without myelopathy or radiculopathy, cervical region: Secondary | ICD-10-CM | POA: Diagnosis not present

## 2021-04-24 DIAGNOSIS — G243 Spasmodic torticollis: Secondary | ICD-10-CM | POA: Diagnosis not present

## 2021-04-24 DIAGNOSIS — R519 Headache, unspecified: Secondary | ICD-10-CM | POA: Diagnosis not present

## 2021-04-30 DIAGNOSIS — G249 Dystonia, unspecified: Secondary | ICD-10-CM | POA: Diagnosis not present

## 2021-04-30 DIAGNOSIS — M542 Cervicalgia: Secondary | ICD-10-CM | POA: Diagnosis not present

## 2021-04-30 DIAGNOSIS — R29898 Other symptoms and signs involving the musculoskeletal system: Secondary | ICD-10-CM | POA: Diagnosis not present

## 2021-05-01 DIAGNOSIS — H401121 Primary open-angle glaucoma, left eye, mild stage: Secondary | ICD-10-CM | POA: Diagnosis not present

## 2021-05-01 DIAGNOSIS — R29898 Other symptoms and signs involving the musculoskeletal system: Secondary | ICD-10-CM | POA: Diagnosis not present

## 2021-05-01 DIAGNOSIS — M542 Cervicalgia: Secondary | ICD-10-CM | POA: Diagnosis not present

## 2021-05-01 DIAGNOSIS — G249 Dystonia, unspecified: Secondary | ICD-10-CM | POA: Diagnosis not present

## 2021-05-01 DIAGNOSIS — H401113 Primary open-angle glaucoma, right eye, severe stage: Secondary | ICD-10-CM | POA: Diagnosis not present

## 2021-05-03 DIAGNOSIS — M542 Cervicalgia: Secondary | ICD-10-CM | POA: Diagnosis not present

## 2021-05-03 DIAGNOSIS — R29898 Other symptoms and signs involving the musculoskeletal system: Secondary | ICD-10-CM | POA: Diagnosis not present

## 2021-05-03 DIAGNOSIS — G249 Dystonia, unspecified: Secondary | ICD-10-CM | POA: Diagnosis not present

## 2021-05-07 DIAGNOSIS — G249 Dystonia, unspecified: Secondary | ICD-10-CM | POA: Diagnosis not present

## 2021-05-07 DIAGNOSIS — R29898 Other symptoms and signs involving the musculoskeletal system: Secondary | ICD-10-CM | POA: Diagnosis not present

## 2021-05-07 DIAGNOSIS — M542 Cervicalgia: Secondary | ICD-10-CM | POA: Diagnosis not present

## 2021-05-09 DIAGNOSIS — R29898 Other symptoms and signs involving the musculoskeletal system: Secondary | ICD-10-CM | POA: Diagnosis not present

## 2021-05-09 DIAGNOSIS — M542 Cervicalgia: Secondary | ICD-10-CM | POA: Diagnosis not present

## 2021-05-09 DIAGNOSIS — G249 Dystonia, unspecified: Secondary | ICD-10-CM | POA: Diagnosis not present

## 2021-05-10 DIAGNOSIS — R29898 Other symptoms and signs involving the musculoskeletal system: Secondary | ICD-10-CM | POA: Diagnosis not present

## 2021-05-10 DIAGNOSIS — M542 Cervicalgia: Secondary | ICD-10-CM | POA: Diagnosis not present

## 2021-05-10 DIAGNOSIS — G249 Dystonia, unspecified: Secondary | ICD-10-CM | POA: Diagnosis not present

## 2021-05-14 DIAGNOSIS — M47812 Spondylosis without myelopathy or radiculopathy, cervical region: Secondary | ICD-10-CM | POA: Diagnosis not present

## 2021-05-18 DIAGNOSIS — R29898 Other symptoms and signs involving the musculoskeletal system: Secondary | ICD-10-CM | POA: Diagnosis not present

## 2021-05-18 DIAGNOSIS — M542 Cervicalgia: Secondary | ICD-10-CM | POA: Diagnosis not present

## 2021-05-18 DIAGNOSIS — G249 Dystonia, unspecified: Secondary | ICD-10-CM | POA: Diagnosis not present

## 2021-05-21 ENCOUNTER — Other Ambulatory Visit: Payer: Self-pay | Admitting: Nurse Practitioner

## 2021-05-22 ENCOUNTER — Other Ambulatory Visit: Payer: Self-pay

## 2021-05-22 DIAGNOSIS — G249 Dystonia, unspecified: Secondary | ICD-10-CM | POA: Diagnosis not present

## 2021-05-22 DIAGNOSIS — M542 Cervicalgia: Secondary | ICD-10-CM | POA: Diagnosis not present

## 2021-05-22 DIAGNOSIS — I8393 Asymptomatic varicose veins of bilateral lower extremities: Secondary | ICD-10-CM

## 2021-05-22 DIAGNOSIS — R29898 Other symptoms and signs involving the musculoskeletal system: Secondary | ICD-10-CM | POA: Diagnosis not present

## 2021-05-24 DIAGNOSIS — M542 Cervicalgia: Secondary | ICD-10-CM | POA: Diagnosis not present

## 2021-05-24 DIAGNOSIS — R29898 Other symptoms and signs involving the musculoskeletal system: Secondary | ICD-10-CM | POA: Diagnosis not present

## 2021-05-24 DIAGNOSIS — G249 Dystonia, unspecified: Secondary | ICD-10-CM | POA: Diagnosis not present

## 2021-05-25 DIAGNOSIS — M542 Cervicalgia: Secondary | ICD-10-CM | POA: Diagnosis not present

## 2021-05-25 DIAGNOSIS — R29898 Other symptoms and signs involving the musculoskeletal system: Secondary | ICD-10-CM | POA: Diagnosis not present

## 2021-05-25 DIAGNOSIS — G249 Dystonia, unspecified: Secondary | ICD-10-CM | POA: Diagnosis not present

## 2021-05-28 DIAGNOSIS — G249 Dystonia, unspecified: Secondary | ICD-10-CM | POA: Diagnosis not present

## 2021-05-28 DIAGNOSIS — R29898 Other symptoms and signs involving the musculoskeletal system: Secondary | ICD-10-CM | POA: Diagnosis not present

## 2021-05-28 DIAGNOSIS — M542 Cervicalgia: Secondary | ICD-10-CM | POA: Diagnosis not present

## 2021-05-30 DIAGNOSIS — M542 Cervicalgia: Secondary | ICD-10-CM | POA: Diagnosis not present

## 2021-05-30 DIAGNOSIS — G249 Dystonia, unspecified: Secondary | ICD-10-CM | POA: Diagnosis not present

## 2021-05-30 DIAGNOSIS — R29898 Other symptoms and signs involving the musculoskeletal system: Secondary | ICD-10-CM | POA: Diagnosis not present

## 2021-05-31 DIAGNOSIS — M542 Cervicalgia: Secondary | ICD-10-CM | POA: Diagnosis not present

## 2021-05-31 DIAGNOSIS — G249 Dystonia, unspecified: Secondary | ICD-10-CM | POA: Diagnosis not present

## 2021-05-31 DIAGNOSIS — R29898 Other symptoms and signs involving the musculoskeletal system: Secondary | ICD-10-CM | POA: Diagnosis not present

## 2021-06-04 ENCOUNTER — Telehealth: Payer: Self-pay

## 2021-06-04 NOTE — Telephone Encounter (Signed)
Called [facility] to get Botox scheduled. Advised patient needs to consent for delivery and once she consents we can call [facility] to set up delivery.   Called patient and left a detailed message informing patient to give [facility] a call to provide consent for Botox delivery at [phone].  Called patient cell phone and spoke to patient and provided her with [place] number to call and give consent. Patient will call today to give consent.

## 2021-06-06 ENCOUNTER — Encounter (HOSPITAL_COMMUNITY): Payer: Medicare PPO

## 2021-06-06 ENCOUNTER — Ambulatory Visit (HOSPITAL_COMMUNITY)
Admission: RE | Admit: 2021-06-06 | Discharge: 2021-06-06 | Disposition: A | Discharge status: Home / Self Care | Payer: Medicare PPO | Source: Ambulatory Visit | Attending: Vascular Surgery | Admitting: Vascular Surgery

## 2021-06-06 ENCOUNTER — Ambulatory Visit: Payer: Medicare PPO | Admitting: Vascular Surgery

## 2021-06-06 ENCOUNTER — Encounter: Payer: Self-pay | Admitting: Vascular Surgery

## 2021-06-06 VITALS — BP 125/74 | HR 50 | Temp 98.0°F | Resp 18 | Ht 64.0 in | Wt 132.0 lb

## 2021-06-06 DIAGNOSIS — I8393 Asymptomatic varicose veins of bilateral lower extremities: Secondary | ICD-10-CM

## 2021-06-06 NOTE — Progress Notes (Signed)
Patient ID: Tracey [name], female   DOB: Jan 28, 1940, 81 y.o.   MRN: [id number]  Reason for Consult: New Patient (Initial Visit)   Referred by Mast, Man X, NP  Subjective:     HPI:  Tracey [name] is a 81 y.o. female without history of vascular disease.  She has had previous right knee replacement complicated by infection but does not have much swelling there.  The left lower extremity she has had a long history of varicose veins without any thrombosis or bleeding.  No history of previous DVT.  She has been told to wear compression stockings in the past but she has not been compliant.  She now has some thickening of her skin along the left medial leg but no ulceration.  She is here today with venous reflux testing.  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[facility])    Heart murmur    History of colon polyps 10/24/2008   Hypertension    Major neurocognitive disorder due to Parkinson's disease, possible    Tremors possible parkinsons   Manic disorder, single episode, in full remission ([place]) 12/01/2009   Sixth nerve palsy    Family History  Problem Relation Age of Onset   Congestive Heart Failure Mother    Dementia Mother    Heart disease Father    Diabetes Father    Lung cancer Son    Past Surgical History:  Procedure Laterality Date   BILATERAL SALPINGOOPHORECTOMY  09/22/2007   CATARACT EXTRACTION Bilateral    Gastrointestinal Stroma Tumor,  Other  1960   GIST Surgery   ILEOCECETOMY  09/22/2007   OVARIAN CYST REMOVAL Right 1967   SMALL INTESTINE SURGERY  09/22/2007   TOTAL KNEE ARTHROPLASTY Right 10/05/2019   Procedure: RIGHT TOTAL KNEE ARTHROPLASTY;   Surgeon: Meredith Pel, MD;  Location: [place];  Service: Orthopedics;  Laterality: Right;   TOTAL VAGINAL HYSTERECTOMY  1986   Fibroids    Short Social History:  Social History   Tobacco Use   Smoking status: Never   Smokeless tobacco: Never  Substance Use Topics   Alcohol use: No    Allergies  Allergen Reactions   Lisinopril Cough   Penicillins Itching    50 years ago   Adhesive [Tape] Itching   Atorvastatin Itching   Dilaudid [Hydromorphone Hcl] Itching   Hydromorphone Itching    Current Outpatient Medications  Medication Sig Dispense Refill   ALPRAZolam (XANAX) 0.5 MG tablet Take 1 tablet (0.5 mg total) by mouth 2 (two) times daily as needed. for anxiety 60 tablet 0   aspirin 81 MG chewable tablet Chew by mouth daily.     clindamycin (CLEOCIN) 150 MG capsule Take '600mg'$  1 hour prior to dental procedure 12 capsule 0   fexofenadine (ALLEGRA) 60 MG tablet Take 60 mg by mouth 2 (two) times daily as needed.     latanoprost (XALATAN) 0.005 % ophthalmic solution INSTILL 1 DROP IN EACH EYE AT BEDTIME. 2.5 mL 12   lithium carbonate 150 MG capsule TAKE 1 CAPSULE TWICE DAILY WITH MEALS. 180 capsule 1   loratadine (CLARITIN) 10 MG tablet Take 1 tablet (10 mg total)  by mouth daily. 10 tablet 1   losartan (COZAAR) 50 MG tablet TAKE ONE TABLET BY MOUTH ONCE DAILY 30 tablet 5   MAGNESIUM PO Take by mouth.     Multiple Vitamin (MULTIVITAMIN) capsule Take 1 capsule by mouth daily.     omeprazole (PRILOSEC) 20 MG capsule TAKE (1) CAPSULE TWICE DAILY. 180 capsule 2   No current facility-administered medications for this visit.    Review of Systems  Constitutional:  Constitutional negative. HENT: HENT negative.  Eyes: Eyes negative.  Respiratory: Respiratory negative.  Cardiovascular: Positive for leg swelling.  GI: Gastrointestinal negative.  Neurological: Neurological negative. Hematologic: Hematologic/lymphatic negative.  Psychiatric: Psychiatric negative.       Objective:   Objective   Vitals:   06/06/21 0942  BP: 125/74  Pulse: (!) 50  Resp: 18  Temp: 98 F (36.7 C)  SpO2: 96%  Weight: 132 lb (59.9 kg)  Height: '5\' 4"'$  (1.626 m)   Body mass index is 22.66 kg/m.  Physical Exam HENT:     Head: Normocephalic.     Nose: Nose normal.  Eyes:     Pupils: Pupils are equal, round, and reactive to light.  Cardiovascular:     Rate and Rhythm: Normal rate.     Pulses: Normal pulses.  Pulmonary:     Effort: Pulmonary effort is normal.  Abdominal:     General: Abdomen is flat.     Palpations: Abdomen is soft.  Musculoskeletal:        General: Normal range of motion.     Cervical back: Normal range of motion and neck supple.     Right lower leg: No edema.     Left lower leg: No edema.     Comments: There is some thickening of the skin of the left medial leg consistent with lipodermatosclerosis.  There are also a few varicosities on the left medial calf.  Skin:    General: Skin is warm and dry.     Capillary Refill: Capillary refill takes less than 2 seconds.  Neurological:     General: No focal deficit present.     Mental Status: She is alert.  Psychiatric:        Mood and Affect: Mood normal.    Data: +--------------+---------+------+-----------+------------+--------+  LEFT          Reflux NoRefluxReflux TimeDiameter cmsComments                          Yes                                   +--------------+---------+------+-----------+------------+--------+  CFV           no                                              +--------------+---------+------+-----------+------------+--------+  FV mid        no                                              +--------------+---------+------+-----------+------------+--------+  Popliteal     no                                              +--------------+---------+------+-----------+------------+--------+  GSV at [facility]              yes    >500 ms      0.52               +--------------+---------+------+-----------+------------+--------+  GSV prox thigh          yes    >500 ms      0.23              +--------------+---------+------+-----------+------------+--------+  GSV mid thigh           yes    >500 ms      0.21              +--------------+---------+------+-----------+------------+--------+  GSV dist thigh          yes    >500 ms      0.23              +--------------+---------+------+-----------+------------+--------+  GSV at knee             yes    >500 ms      0.30              +--------------+---------+------+-----------+------------+--------+  GSV prox calf           yes    >500 ms      0.26              +--------------+---------+------+-----------+------------+--------+  SSV Pop Fossa no                            0.15              +--------------+---------+------+-----------+------------+--------+           Summary:  Left:  - No evidence of deep vein thrombosis from the common femoral through the  popliteal veins.  - No evidence of superficial venous thrombosis.  - The deep venous system is competent.  - The great saphenous vein is incompetent.  - The small saphenous vein is competent.      Assessment/Plan:     81 year old female with what appears to be lipodermatosclerosis which is minimal of the left leg no ulceration, skin does appear healthy but without any significant large greater saphenous vein that is refluxing to suggest intervention would be beneficial.  We will fit her for knee-high stockings today.  She can follow-up on an as-needed basis.     Waynetta Sandy MD [facility]

## 2021-06-07 DIAGNOSIS — M542 Cervicalgia: Secondary | ICD-10-CM | POA: Diagnosis not present

## 2021-06-07 DIAGNOSIS — G249 Dystonia, unspecified: Secondary | ICD-10-CM | POA: Diagnosis not present

## 2021-06-07 DIAGNOSIS — R29898 Other symptoms and signs involving the musculoskeletal system: Secondary | ICD-10-CM | POA: Diagnosis not present

## 2021-06-08 DIAGNOSIS — R29898 Other symptoms and signs involving the musculoskeletal system: Secondary | ICD-10-CM | POA: Diagnosis not present

## 2021-06-08 DIAGNOSIS — G249 Dystonia, unspecified: Secondary | ICD-10-CM | POA: Diagnosis not present

## 2021-06-08 DIAGNOSIS — M542 Cervicalgia: Secondary | ICD-10-CM | POA: Diagnosis not present

## 2021-06-13 DIAGNOSIS — M542 Cervicalgia: Secondary | ICD-10-CM | POA: Diagnosis not present

## 2021-06-13 DIAGNOSIS — R29898 Other symptoms and signs involving the musculoskeletal system: Secondary | ICD-10-CM | POA: Diagnosis not present

## 2021-06-13 DIAGNOSIS — G249 Dystonia, unspecified: Secondary | ICD-10-CM | POA: Diagnosis not present

## 2021-06-14 DIAGNOSIS — M542 Cervicalgia: Secondary | ICD-10-CM | POA: Diagnosis not present

## 2021-06-14 DIAGNOSIS — R29898 Other symptoms and signs involving the musculoskeletal system: Secondary | ICD-10-CM | POA: Diagnosis not present

## 2021-06-14 DIAGNOSIS — G249 Dystonia, unspecified: Secondary | ICD-10-CM | POA: Diagnosis not present

## 2021-06-15 ENCOUNTER — Ambulatory Visit: Payer: Medicare PPO | Admitting: Neurology

## 2021-06-15 DIAGNOSIS — G243 Spasmodic torticollis: Secondary | ICD-10-CM

## 2021-06-15 DIAGNOSIS — R519 Headache, unspecified: Secondary | ICD-10-CM | POA: Diagnosis not present

## 2021-06-15 DIAGNOSIS — M47812 Spondylosis without myelopathy or radiculopathy, cervical region: Secondary | ICD-10-CM | POA: Diagnosis not present

## 2021-06-15 MED ORDER — ONABOTULINUMTOXINA 100 UNITS IJ SOLR
300.0000 [IU] | Freq: Once | INTRAMUSCULAR | Status: AC
  Administered 2021-06-15: 300 [IU] via INTRAMUSCULAR

## 2021-06-15 NOTE — Procedures (Signed)
Bot[facility]um Clinic   Procedure Note Botox  Attending: Dr. Wells Guiles Viral Schramm  Preoperative Diagnosis(es): Cervical Dystonia  Result History  Doing well.  Much better with pain since Dr. Davy Pique injected neck.    Consent obtained from: The patient Benefits discussed included, but were not limited to decreased muscle tightness, increased joint range of motion, and decreased pain.  Risk discussed included, but were not limited pain and discomfort, bleeding, bruising, excessive weakness, venous thrombosis, muscle atrophy and dysphagia.  A copy of the patient medication guide was given to the patient which explains the blackbox warning.  Patients identity and treatment sites confirmed Yes.  .  Details of Procedure: Skin was cleaned with alcohol.  A 30 gauge, 35m  needle was introduced to the target muscle, except for posterior splenius where 27 gauge, 1.5 inch needle used.   Prior to injection, the needle plunger was aspirated to make sure the needle was not within a blood vessel.  There was no blood retrieved on aspiration.    Following is a summary of the muscles injected  And the amount of Botulinum toxin used:   Dilution 0.9% preservative free saline mixed with 100 u Botox type A to make 10 U per 0.1cc  Injections  Location Left  Right Units Number of sites        Sternocleidomastoid 60  60 1  Splenius Capitus, posterior approach  100 100 1  Splenius Capitus, lateral approach  40 40 1  Levator Scapulae      Trapezius 20/20 20/20 80 4  L cervical paraspinal C5 '20  20 1  '$ TOTAL UNITS:   300     Agent: Botulinum Type A ( Onobotulinum Toxin type A ).  3 vials of Botox were used, each containing 100 units and freshly diluted with 1 mL of sterile, non-preserved saline   Total injected (Units): 300  Total wasted (Units): 0   Pt tolerated procedure well without complications.   Reinjection is anticipated in 3 months.

## 2021-06-18 DIAGNOSIS — R29898 Other symptoms and signs involving the musculoskeletal system: Secondary | ICD-10-CM | POA: Diagnosis not present

## 2021-06-18 DIAGNOSIS — M542 Cervicalgia: Secondary | ICD-10-CM | POA: Diagnosis not present

## 2021-06-18 DIAGNOSIS — G249 Dystonia, unspecified: Secondary | ICD-10-CM | POA: Diagnosis not present

## 2021-06-20 DIAGNOSIS — G249 Dystonia, unspecified: Secondary | ICD-10-CM | POA: Diagnosis not present

## 2021-06-20 DIAGNOSIS — R29898 Other symptoms and signs involving the musculoskeletal system: Secondary | ICD-10-CM | POA: Diagnosis not present

## 2021-06-20 DIAGNOSIS — M542 Cervicalgia: Secondary | ICD-10-CM | POA: Diagnosis not present

## 2021-06-21 DIAGNOSIS — M542 Cervicalgia: Secondary | ICD-10-CM | POA: Diagnosis not present

## 2021-06-21 DIAGNOSIS — G249 Dystonia, unspecified: Secondary | ICD-10-CM | POA: Diagnosis not present

## 2021-06-21 DIAGNOSIS — R29898 Other symptoms and signs involving the musculoskeletal system: Secondary | ICD-10-CM | POA: Diagnosis not present

## 2021-07-02 DIAGNOSIS — M542 Cervicalgia: Secondary | ICD-10-CM | POA: Diagnosis not present

## 2021-07-02 DIAGNOSIS — R29898 Other symptoms and signs involving the musculoskeletal system: Secondary | ICD-10-CM | POA: Diagnosis not present

## 2021-07-02 DIAGNOSIS — G249 Dystonia, unspecified: Secondary | ICD-10-CM | POA: Diagnosis not present

## 2021-07-04 ENCOUNTER — Non-Acute Institutional Stay: Payer: Medicare PPO | Admitting: Family Medicine

## 2021-07-04 ENCOUNTER — Encounter: Payer: Self-pay | Admitting: Family Medicine

## 2021-07-04 VITALS — BP 122/78 | HR 56 | Temp 97.5°F | Ht 64.0 in | Wt 131.4 lb

## 2021-07-04 DIAGNOSIS — G243 Spasmodic torticollis: Secondary | ICD-10-CM

## 2021-07-04 DIAGNOSIS — N1831 Chronic kidney disease, stage 3a: Secondary | ICD-10-CM

## 2021-07-04 DIAGNOSIS — L309 Dermatitis, unspecified: Secondary | ICD-10-CM

## 2021-07-04 DIAGNOSIS — F304 Manic episode in full remission: Secondary | ICD-10-CM

## 2021-07-04 DIAGNOSIS — R609 Edema, unspecified: Secondary | ICD-10-CM

## 2021-07-04 NOTE — Progress Notes (Signed)
Provider:  Alain Honey, MD  Careteam: Patient Care Team: Mast, Man X, NP as PCP - General (Internal Medicine) Marti Sleigh, MD as Consulting Physician (Gynecologic Oncology) Ladell Pier, MD as Consulting Physician (Oncology) Richmond Campbell, MD as Consulting Physician (Gastroenterology) Clent Jacks, MD as Consulting Physician (Ophthalmology) Sabra Heck Precious Haws, MD as Referring Physician (Neurology) Tat, Eustace Quail, DO as Consulting Physician (Neurology)  PLACE OF SERVICE:  [facility]  Advanced Directive information    Allergies  Allergen Reactions   Lisinopril Cough   Penicillins Itching    50 years ago   Adhesive [Tape] Itching   Atorvastatin Itching   Dilaudid [Hydromorphone Hcl] Itching   Hydromorphone Itching    No chief complaint on file.    HPI: Patient is a 81 y.o. female routine follow-up for medical management of chronic problems including chronic kidney disease bipolar disorder, hyperlipidemia, spasmodic torticollis, and hypertension.  Overall she seems to be doing well.  She received Botox injections for her torticollis also described as cervical dystonia.  She has been given Topamax for her headaches and these have also helped. She describes some discomfort in her right knee which was replaced 2 years ago.  There is minimal swelling but symptoms are tolerable. She does complain today of some dermatitis on the lower legs.  She is using general lotion for this problem.  Review of Systems:  Review of Systems  Constitutional: Negative.   Respiratory: Negative.    Cardiovascular:  Positive for leg swelling.  Musculoskeletal:  Positive for neck pain.  Skin:  Positive for rash.  Neurological:  Positive for headaches.  All other systems reviewed and are negative.   Past Medical History:  Diagnosis Date   Anxiety    Arthritis    Cardiac conduction disorder 03/30/2012   Overview:  STORY: ETT 03/09/2012 Echo 03/07/2012 normal Dr Einar Gip,  bradycardia felt due to glaucoma eye drops   Cervical dystonia    Diverticulosis of colon    GERD (gastroesophageal reflux disease)    GIST (gastrointestinal stroma tumor), malignant, colon ([place])    Heart murmur    History of colon polyps 10/24/2008   Hypertension    Major neurocognitive disorder due to Parkinson's disease, possible    Tremors possible parkinsons   Manic disorder, single episode, in full remission ([place]) 12/01/2009   Sixth nerve palsy    Past Surgical History:  Procedure Laterality Date   BILATERAL SALPINGOOPHORECTOMY  09/22/2007   CATARACT EXTRACTION Bilateral    Gastrointestinal Stroma Tumor,  Other  1960   GIST Surgery   ILEOCECETOMY  09/22/2007   OVARIAN CYST REMOVAL Right 1967   SMALL INTESTINE SURGERY  09/22/2007   TOTAL KNEE ARTHROPLASTY Right 10/05/2019   Procedure: RIGHT TOTAL KNEE ARTHROPLASTY;  Surgeon: Meredith Pel, MD;  Location: [facility];  Service: Orthopedics;  Laterality: Right;   TOTAL VAGINAL HYSTERECTOMY  1986   Fibroids   Social History:   reports that she has never smoked. She has never used smokeless tobacco. She reports that she does not drink alcohol and does not use drugs.  Family History  Problem Relation Age of Onset   Congestive Heart Failure Mother    Dementia Mother    Heart disease Father    Diabetes Father    Lung cancer Son     Medications: Patient's Medications  New Prescriptions   No medications on file  Previous Medications   ALPRAZOLAM (XANAX) 0.5 MG TABLET    Take 1 tablet (0.5 mg total)  by mouth 2 (two) times daily as needed. for anxiety   ASPIRIN 81 MG CHEWABLE TABLET    Chew by mouth daily.   CLINDAMYCIN (CLEOCIN) 150 MG CAPSULE    Take '600mg'$  1 hour prior to dental procedure   FEXOFENADINE (ALLEGRA) 60 MG TABLET    Take 60 mg by mouth 2 (two) times daily as needed.   LATANOPROST (XALATAN) 0.005 % OPHTHALMIC SOLUTION    INSTILL 1 DROP IN [facility] EYE AT BEDTIME.   LITHIUM CARBONATE 150 MG CAPSULE    TAKE 1  CAPSULE TWICE DAILY WITH MEALS.   LORATADINE (CLARITIN) 10 MG TABLET    Take 1 tablet (10 mg total) by mouth daily.   LOSARTAN (COZAAR) 50 MG TABLET    TAKE ONE TABLET BY MOUTH ONCE DAILY   MAGNESIUM PO    Take by mouth.   MULTIPLE VITAMIN (MULTIVITAMIN) CAPSULE    Take 1 capsule by mouth daily.   OMEPRAZOLE (PRILOSEC) 20 MG CAPSULE    TAKE (1) CAPSULE TWICE DAILY.   TOPIRAMATE (TOPAMAX) 25 MG CAPSULE    Take 25 mg by mouth 2 (two) times daily.  Modified Medications   No medications on file  Discontinued Medications   No medications on file    Physical Exam:  There were no vitals filed for this visit. There is no height or weight on file to calculate BMI. Wt Readings from Last 3 Encounters:  06/06/21 132 lb (59.9 kg)  04/12/21 140 lb (63.5 kg)  01/19/21 147 lb 12.8 oz (67 kg)    Physical Exam Vitals and nursing note reviewed.  Constitutional:      Appearance: Normal appearance.  Cardiovascular:     Rate and Rhythm: Normal rate and regular rhythm.  Pulmonary:     Effort: Pulmonary effort is normal.  Skin:    Findings: Erythema present.     Comments: There is some erythema as well as changes suggestive of stasis dermatitis on her left lower leg.  Neurological:     General: No focal deficit present.     Mental Status: She is alert and oriented to person, place, and time.     Labs reviewed: Basic Metabolic Panel: Recent Labs    04/09/21 1716  NA 141  K 4.7  CL 109  CO2 24  GLUCOSE 93  BUN 21  CREATININE 0.99*  CALCIUM 10.7*  TSH 2.08   Liver Function Tests: Recent Labs    04/09/21 1716  AST 20  ALT 11  BILITOT 0.6  PROT 6.6   No results for input(s): "LIPASE", "AMYLASE" in the last 8760 hours. No results for input(s): "AMMONIA" in the last 8760 hours. CBC: Recent Labs    04/09/21 1716  WBC 6.5  NEUTROABS 4,485  HGB 12.8  HCT 38.6  MCV 97.7  PLT 306   Lipid Panel: Recent Labs    04/09/21 1716  CHOL 271*  HDL 67  LDLCALC 166*  TRIG 213*   CHOLHDL 4.0   TSH: Recent Labs    04/09/21 1716  TSH 2.08   A1C: No results found for: "HGBA1C"   Assessment/Plan  1. Bipolar I disorder, single manic episode, in full remission ([place]) Continues to take lithium.  Issue seems to be in remission  2. Cervical dystonia This is being treated by neurology with Botox injections  3. Stage 3a chronic kidney disease ([place]) Most recent labs from 3 months ago showed GFR of 58 and creatinine 0.9  4. Dermatitis I think this dermatitis is related  to edema.  I have added some triamcinolone to be used twice a day mixed with her moistening lotion.  She is instructed to elevate her legs whenever possible     Alain Honey, MD [place] [phone]

## 2021-07-06 ENCOUNTER — Telehealth: Payer: Self-pay | Admitting: *Deleted

## 2021-07-06 DIAGNOSIS — M542 Cervicalgia: Secondary | ICD-10-CM | POA: Diagnosis not present

## 2021-07-06 DIAGNOSIS — R29898 Other symptoms and signs involving the musculoskeletal system: Secondary | ICD-10-CM | POA: Diagnosis not present

## 2021-07-06 DIAGNOSIS — G249 Dystonia, unspecified: Secondary | ICD-10-CM | POA: Diagnosis not present

## 2021-07-06 MED ORDER — TRIAMCINOLONE ACETONIDE 0.5 % EX CREA: 1.0000 | TOPICAL_CREAM | Freq: Two times a day (BID) | CUTANEOUS | 1 refills | Status: DC

## 2021-07-06 NOTE — Telephone Encounter (Signed)
Patient called and stated that Dr. Hyacinth Meeker was to call in a Rx for her when she was seen on 07/04/2021 but the pharmacy has not received it yet.   I reviewed OV note: think this dermatitis is related to edema.  I have added some triamcinolone to be used twice a day mixed with her moistening lotion.  She is instructed to elevate her legs whenever possible

## 2021-07-09 ENCOUNTER — Other Ambulatory Visit: Payer: Self-pay | Admitting: Nurse Practitioner

## 2021-07-09 DIAGNOSIS — F304 Manic episode in full remission: Secondary | ICD-10-CM

## 2021-07-10 DIAGNOSIS — R29898 Other symptoms and signs involving the musculoskeletal system: Secondary | ICD-10-CM | POA: Diagnosis not present

## 2021-07-10 DIAGNOSIS — G249 Dystonia, unspecified: Secondary | ICD-10-CM | POA: Diagnosis not present

## 2021-07-10 DIAGNOSIS — M542 Cervicalgia: Secondary | ICD-10-CM | POA: Diagnosis not present

## 2021-07-12 ENCOUNTER — Encounter: Payer: Medicare PPO | Admitting: Nurse Practitioner

## 2021-07-23 DIAGNOSIS — H5711 Ocular pain, right eye: Secondary | ICD-10-CM | POA: Diagnosis not present

## 2021-07-23 DIAGNOSIS — H04123 Dry eye syndrome of bilateral lacrimal glands: Secondary | ICD-10-CM | POA: Diagnosis not present

## 2021-07-23 DIAGNOSIS — H401131 Primary open-angle glaucoma, bilateral, mild stage: Secondary | ICD-10-CM | POA: Diagnosis not present

## 2021-07-23 DIAGNOSIS — H47011 Ischemic optic neuropathy, right eye: Secondary | ICD-10-CM | POA: Diagnosis not present

## 2021-07-24 DIAGNOSIS — M542 Cervicalgia: Secondary | ICD-10-CM | POA: Diagnosis not present

## 2021-07-24 DIAGNOSIS — G249 Dystonia, unspecified: Secondary | ICD-10-CM | POA: Diagnosis not present

## 2021-07-24 DIAGNOSIS — R29898 Other symptoms and signs involving the musculoskeletal system: Secondary | ICD-10-CM | POA: Diagnosis not present

## 2021-07-27 DIAGNOSIS — R29898 Other symptoms and signs involving the musculoskeletal system: Secondary | ICD-10-CM | POA: Diagnosis not present

## 2021-07-27 DIAGNOSIS — M542 Cervicalgia: Secondary | ICD-10-CM | POA: Diagnosis not present

## 2021-07-27 DIAGNOSIS — G249 Dystonia, unspecified: Secondary | ICD-10-CM | POA: Diagnosis not present

## 2021-07-31 DIAGNOSIS — M542 Cervicalgia: Secondary | ICD-10-CM | POA: Diagnosis not present

## 2021-07-31 DIAGNOSIS — R29898 Other symptoms and signs involving the musculoskeletal system: Secondary | ICD-10-CM | POA: Diagnosis not present

## 2021-07-31 DIAGNOSIS — G249 Dystonia, unspecified: Secondary | ICD-10-CM | POA: Diagnosis not present

## 2021-08-03 DIAGNOSIS — R29898 Other symptoms and signs involving the musculoskeletal system: Secondary | ICD-10-CM | POA: Diagnosis not present

## 2021-08-03 DIAGNOSIS — G249 Dystonia, unspecified: Secondary | ICD-10-CM | POA: Diagnosis not present

## 2021-08-03 DIAGNOSIS — M542 Cervicalgia: Secondary | ICD-10-CM | POA: Diagnosis not present

## 2021-08-07 DIAGNOSIS — R29898 Other symptoms and signs involving the musculoskeletal system: Secondary | ICD-10-CM | POA: Diagnosis not present

## 2021-08-07 DIAGNOSIS — G249 Dystonia, unspecified: Secondary | ICD-10-CM | POA: Diagnosis not present

## 2021-08-07 DIAGNOSIS — M542 Cervicalgia: Secondary | ICD-10-CM | POA: Diagnosis not present

## 2021-08-10 DIAGNOSIS — H00011 Hordeolum externum right upper eyelid: Secondary | ICD-10-CM | POA: Diagnosis not present

## 2021-08-10 DIAGNOSIS — H5711 Ocular pain, right eye: Secondary | ICD-10-CM | POA: Diagnosis not present

## 2021-08-17 DIAGNOSIS — R29898 Other symptoms and signs involving the musculoskeletal system: Secondary | ICD-10-CM | POA: Diagnosis not present

## 2021-08-17 DIAGNOSIS — M542 Cervicalgia: Secondary | ICD-10-CM | POA: Diagnosis not present

## 2021-08-17 DIAGNOSIS — G249 Dystonia, unspecified: Secondary | ICD-10-CM | POA: Diagnosis not present

## 2021-08-21 DIAGNOSIS — M542 Cervicalgia: Secondary | ICD-10-CM | POA: Diagnosis not present

## 2021-08-21 DIAGNOSIS — G249 Dystonia, unspecified: Secondary | ICD-10-CM | POA: Diagnosis not present

## 2021-08-21 DIAGNOSIS — R29898 Other symptoms and signs involving the musculoskeletal system: Secondary | ICD-10-CM | POA: Diagnosis not present

## 2021-08-22 ENCOUNTER — Other Ambulatory Visit: Payer: Self-pay | Admitting: Orthopedic Surgery

## 2021-08-22 ENCOUNTER — Other Ambulatory Visit: Payer: Self-pay | Admitting: Nurse Practitioner

## 2021-08-22 DIAGNOSIS — F304 Manic episode in full remission: Secondary | ICD-10-CM

## 2021-08-22 NOTE — Telephone Encounter (Signed)
High warning came up when trying to fill medication  Medication pended and sent to Man X Mast, NP

## 2021-08-22 NOTE — Telephone Encounter (Signed)
Medication pended and sent to Man X Mast, NP for aproval

## 2021-08-24 DIAGNOSIS — H47011 Ischemic optic neuropathy, right eye: Secondary | ICD-10-CM | POA: Diagnosis not present

## 2021-08-24 DIAGNOSIS — G249 Dystonia, unspecified: Secondary | ICD-10-CM | POA: Diagnosis not present

## 2021-08-24 DIAGNOSIS — H00011 Hordeolum externum right upper eyelid: Secondary | ICD-10-CM | POA: Diagnosis not present

## 2021-08-24 DIAGNOSIS — M542 Cervicalgia: Secondary | ICD-10-CM | POA: Diagnosis not present

## 2021-08-24 DIAGNOSIS — R29898 Other symptoms and signs involving the musculoskeletal system: Secondary | ICD-10-CM | POA: Diagnosis not present

## 2021-08-24 DIAGNOSIS — H43393 Other vitreous opacities, bilateral: Secondary | ICD-10-CM | POA: Diagnosis not present

## 2021-08-24 DIAGNOSIS — H401121 Primary open-angle glaucoma, left eye, mild stage: Secondary | ICD-10-CM | POA: Diagnosis not present

## 2021-08-24 DIAGNOSIS — H4922 Sixth [abducent] nerve palsy, left eye: Secondary | ICD-10-CM | POA: Diagnosis not present

## 2021-08-28 ENCOUNTER — Other Ambulatory Visit: Payer: Self-pay

## 2021-08-28 ENCOUNTER — Telehealth: Payer: Self-pay | Admitting: Neurology

## 2021-08-28 DIAGNOSIS — M542 Cervicalgia: Secondary | ICD-10-CM | POA: Diagnosis not present

## 2021-08-28 DIAGNOSIS — R29898 Other symptoms and signs involving the musculoskeletal system: Secondary | ICD-10-CM | POA: Diagnosis not present

## 2021-08-28 DIAGNOSIS — G249 Dystonia, unspecified: Secondary | ICD-10-CM | POA: Diagnosis not present

## 2021-08-28 MED ORDER — BOTOX 100 UNITS IJ SOLR: INTRAMUSCULAR | 1 refills | Status: DC

## 2021-08-28 NOTE — Telephone Encounter (Signed)
Patient left a voice mail requesting a call back. She received a call about her Botox and she has questions about what they need.

## 2021-08-28 NOTE — Telephone Encounter (Signed)
Called pateint and left voicemail message  

## 2021-08-31 DIAGNOSIS — H401121 Primary open-angle glaucoma, left eye, mild stage: Secondary | ICD-10-CM | POA: Diagnosis not present

## 2021-08-31 DIAGNOSIS — H401112 Primary open-angle glaucoma, right eye, moderate stage: Secondary | ICD-10-CM | POA: Diagnosis not present

## 2021-08-31 DIAGNOSIS — H47011 Ischemic optic neuropathy, right eye: Secondary | ICD-10-CM | POA: Diagnosis not present

## 2021-08-31 DIAGNOSIS — H00011 Hordeolum externum right upper eyelid: Secondary | ICD-10-CM | POA: Diagnosis not present

## 2021-09-04 DIAGNOSIS — G249 Dystonia, unspecified: Secondary | ICD-10-CM | POA: Diagnosis not present

## 2021-09-04 DIAGNOSIS — M542 Cervicalgia: Secondary | ICD-10-CM | POA: Diagnosis not present

## 2021-09-04 DIAGNOSIS — R29898 Other symptoms and signs involving the musculoskeletal system: Secondary | ICD-10-CM | POA: Diagnosis not present

## 2021-09-07 DIAGNOSIS — M542 Cervicalgia: Secondary | ICD-10-CM | POA: Diagnosis not present

## 2021-09-07 DIAGNOSIS — R29898 Other symptoms and signs involving the musculoskeletal system: Secondary | ICD-10-CM | POA: Diagnosis not present

## 2021-09-07 DIAGNOSIS — G249 Dystonia, unspecified: Secondary | ICD-10-CM | POA: Diagnosis not present

## 2021-09-11 ENCOUNTER — Other Ambulatory Visit: Payer: Self-pay | Admitting: Nurse Practitioner

## 2021-09-11 DIAGNOSIS — G249 Dystonia, unspecified: Secondary | ICD-10-CM | POA: Diagnosis not present

## 2021-09-11 DIAGNOSIS — R29898 Other symptoms and signs involving the musculoskeletal system: Secondary | ICD-10-CM | POA: Diagnosis not present

## 2021-09-11 DIAGNOSIS — M542 Cervicalgia: Secondary | ICD-10-CM | POA: Diagnosis not present

## 2021-09-11 DIAGNOSIS — F304 Manic episode in full remission: Secondary | ICD-10-CM

## 2021-09-11 NOTE — Telephone Encounter (Signed)
RX last refilled on 08/22/21 (it's a little early). No treatment agreement on file, notation made on appointment for September 2023

## 2021-09-14 ENCOUNTER — Ambulatory Visit: Payer: Medicare PPO | Admitting: Neurology

## 2021-09-14 DIAGNOSIS — G243 Spasmodic torticollis: Secondary | ICD-10-CM | POA: Diagnosis not present

## 2021-09-14 MED ORDER — ONABOTULINUMTOXINA 100 UNITS IJ SOLR
300.0000 [IU] | Freq: Once | INTRAMUSCULAR | Status: AC
  Administered 2021-09-14: 300 [IU] via INTRAMUSCULAR

## 2021-09-14 NOTE — Procedures (Signed)
[facility]   Procedure Note Botox  Attending: Dr. Arma Reining  Preoperative Diagnosis(es): Cervical Dystonia  Result History  Doing well with this  Consent obtained from: The patient Benefits discussed included, but were not limited to decreased muscle tightness, increased joint range of motion, and decreased pain.  Risk discussed included, but were not limited pain and discomfort, bleeding, bruising, excessive weakness, venous thrombosis, muscle atrophy and dysphagia.  A copy of the patient medication guide was given to the patient which explains the blackbox warning.  Patients identity and treatment sites confirmed Yes.  .  Details of Procedure: Skin was cleaned with alcohol.  A 30 gauge, 25mm  needle was introduced to the target muscle, except for posterior splenius where 27 gauge, 1.5 inch needle used.   Prior to injection, the needle plunger was aspirated to make sure the needle was not within a blood vessel.  There was no blood retrieved on aspiration.    Following is a summary of the muscles injected  And the amount of Botulinum toxin used:   Dilution 0.9% preservative free saline mixed with 100 u Botox type A to make 10 U per 0.1cc  Injections  Location Left  Right Units Number of sites        Sternocleidomastoid 60  60 1  Splenius Capitus, posterior approach  100 100 1  Splenius Capitus, lateral approach  40 40 1  Levator Scapulae      Trapezius 20/20 20/20 80 4  L cervical paraspinal C5 20  20 1  TOTAL UNITS:   300     Agent: Botulinum Type A ( Onobotulinum Toxin type A ).  3 vials of Botox were used, each containing 100 units and freshly diluted with 1 mL of sterile, non-preserved saline   Total injected (Units): 300  Total wasted (Units): 0   Pt tolerated procedure well without complications.   Reinjection is anticipated in 3 months.  Clinical comment: Patient had numerous separate questions from this.  She went to her optometrist, Suzanne Parker, OD at  [facility].  She apparently had an infection in the right eye which has not cleared up and then developed what she called a twitching in the right eye.  The eye doctor told her that it was part of her dystonia.  She also told the patient that she should not be looking into light and she should be staying in the dark.  Patient has noticed she has been off balance more and she is not sure if it is because of "this dystonia" or because she is staying in the dark or because of something different.  I told her that she needed to make a separate appointment for me to evaluate this.  However, the only thing that I saw going on in the right eye was a fasciculation and told her that this did not have anything to do with dystonia.  Discussed Botox for this, but did not recommend it at all.  She is going to make a follow-up appointment for evaluation.  

## 2021-09-18 DIAGNOSIS — M542 Cervicalgia: Secondary | ICD-10-CM | POA: Diagnosis not present

## 2021-09-18 DIAGNOSIS — R29898 Other symptoms and signs involving the musculoskeletal system: Secondary | ICD-10-CM | POA: Diagnosis not present

## 2021-09-18 DIAGNOSIS — G249 Dystonia, unspecified: Secondary | ICD-10-CM | POA: Diagnosis not present

## 2021-09-20 DIAGNOSIS — H401121 Primary open-angle glaucoma, left eye, mild stage: Secondary | ICD-10-CM | POA: Diagnosis not present

## 2021-09-20 DIAGNOSIS — H04123 Dry eye syndrome of bilateral lacrimal glands: Secondary | ICD-10-CM | POA: Diagnosis not present

## 2021-09-20 DIAGNOSIS — H401112 Primary open-angle glaucoma, right eye, moderate stage: Secondary | ICD-10-CM | POA: Diagnosis not present

## 2021-09-25 DIAGNOSIS — M542 Cervicalgia: Secondary | ICD-10-CM | POA: Diagnosis not present

## 2021-09-25 DIAGNOSIS — R29898 Other symptoms and signs involving the musculoskeletal system: Secondary | ICD-10-CM | POA: Diagnosis not present

## 2021-09-25 DIAGNOSIS — G249 Dystonia, unspecified: Secondary | ICD-10-CM | POA: Diagnosis not present

## 2021-09-28 ENCOUNTER — Telehealth: Payer: Self-pay | Admitting: *Deleted

## 2021-09-28 DIAGNOSIS — R29898 Other symptoms and signs involving the musculoskeletal system: Secondary | ICD-10-CM | POA: Diagnosis not present

## 2021-09-28 DIAGNOSIS — G249 Dystonia, unspecified: Secondary | ICD-10-CM | POA: Diagnosis not present

## 2021-09-28 DIAGNOSIS — M542 Cervicalgia: Secondary | ICD-10-CM | POA: Diagnosis not present

## 2021-09-28 NOTE — Telephone Encounter (Signed)
Patient returned phone call and stated that she is unable to come to the office because she has no transportation. Patient states that she will call [facility] to see if they can recommend something.

## 2021-09-28 NOTE — Telephone Encounter (Signed)
Patient called and stated that she is having a Sore Throat for the past 3 days. No fever and no other symptoms.   Teresa at [facility] tested patient for [place] and it was Negative.   Patient has been using Zyrtec with no relief.   Please Advise.

## 2021-09-28 NOTE — Telephone Encounter (Signed)
Mast, Man X, NP  You 1 hour ago (1:04 PM)   The patient should be seen in [facility] today. Thank you.

## 2021-09-28 NOTE — Telephone Encounter (Signed)
LMOM to return call. Faythe Dingwall opened up Tracey [name]'s schedule and I was going to add her on.   Awaiting call back have tried calling home and cell number listed several times.

## 2021-10-02 DIAGNOSIS — G249 Dystonia, unspecified: Secondary | ICD-10-CM | POA: Diagnosis not present

## 2021-10-02 DIAGNOSIS — R29898 Other symptoms and signs involving the musculoskeletal system: Secondary | ICD-10-CM | POA: Diagnosis not present

## 2021-10-02 DIAGNOSIS — M542 Cervicalgia: Secondary | ICD-10-CM | POA: Diagnosis not present

## 2021-10-03 NOTE — Progress Notes (Signed)
Assessment/Plan:   1.  Parkinsonism  -DaTscan equivocal.  Levodopa challenge test in January, 2023 was negative.    2.  Bipolar disorder  -Suspect, as above, that she has lithium induced tremor.   overall very mild but bothersome to her.  Reviewed this again with her today.  Discussed that this is not ET.  Discussed that this diagnosis cannot be made in the face of being on lithium.  She was treated in the past with primidone, but this is generally not effective for lithium induced tremor.  She is going to discuss with provider who RX the lithium.  I didn't recommend she taper or change it, unless indicated.  3.  Cervical dystonia  -Patient last Botox injection in September 1.  Helping.  She doesn't want to stop  -Reiterated to the patient that she does not have segmental or generalized dystonia and the twitching in her eye is a mere fasciculation and has nothing to do with dystonia.  Her optometrist told her that it was related to dystonia and I assured her that was not the case.  I really don't recommend botox for this mild fasciculation as she had prior infection in the eye  -RX for PT written  4.  Severe degenerative cervical changes  -Patient has had successful medial branch blocks at [facility].  -The nurse practitioner has her on Topamax, 25 mg twice per day   Subjective:   Tracey [name] was seen today in follow up.  At her last Botox appointment, the patient had multiple questions that related to her optometrist.  She is seen by an optometrist, Mardene Speak, at [facility].  Patient apparently developed twitching in the right eye and her optometrist apparently told her it was part of her "dystonia."  When I saw her at her Botox appointment for cervical dystonia, I told her it was not the case.  She made a follow-up appointment to discuss further.  She is also bothered by tremor.  It is bothersome to her.  Has been on lithium for "centuries."  Separately, she  is doing PT for the neck and she finds it helpful.  She wants more PT sessions.    Current prescribed movement disorder medications: Carbidopa/levodopa 25/100, 1 tablet 3 times per day -she does think it helps significantly.   PREVIOUS MEDICATIONS: primidone, 50 mg, 3 tablets daily (initially seemed to help); carbidopa/levodopa 10/100, 1 tablet 3 times per day  ALLERGIES:   Allergies  Allergen Reactions   Lisinopril Cough   Penicillins Itching    50 years ago   Adhesive [Tape] Itching   Atorvastatin Itching   Dilaudid [Hydromorphone Hcl] Itching   Hydromorphone Itching    CURRENT MEDICATIONS:  Outpatient Encounter Medications as of 10/05/2021  Medication Sig   ALPRAZolam (XANAX) 0.5 MG tablet TAKE ONE TABLET BY MOUTH TWICE DAILY AS NEEDED FOR ANXIETY   aspirin 81 MG chewable tablet Chew by mouth daily.   botulinum toxin Type A (BOTOX) 100 units SOLR injection Inject 300 units into the head and neck every 90 days by Dr. Wells Guiles Irene Collings   clindamycin (CLEOCIN) 150 MG capsule Take '600mg'$  1 hour prior to dental procedure   dorzolamide (TRUSOPT) 2 % ophthalmic solution 1 drop 3 (three) times daily.   fexofenadine (ALLEGRA) 60 MG tablet Take 60 mg by mouth 2 (two) times daily as needed.   latanoprost (XALATAN) 0.005 % ophthalmic solution INSTILL 1 DROP IN [facility] EYE AT BEDTIME.   lithium carbonate 150  MG capsule TAKE 1 CAPSULE TWICE DAILY WITH MEALS.   loratadine (CLARITIN) 10 MG tablet Take 1 tablet (10 mg total) by mouth daily.   losartan (COZAAR) 50 MG tablet TAKE ONE TABLET BY MOUTH ONCE DAILY   MAGNESIUM PO Take by mouth.   Multiple Vitamin (MULTIVITAMIN) capsule Take 1 capsule by mouth daily.   omeprazole (PRILOSEC) 20 MG capsule TAKE (1) CAPSULE TWICE DAILY.   timolol (BETIMOL) 0.25 % ophthalmic solution 1-2 drops 2 (two) times daily.   topiramate (TOPAMAX) 25 MG capsule Take 25 mg by mouth 2 (two) times daily.   triamcinolone cream (KENALOG) 0.5 % Apply 1 Application topically 2 (two)  times daily.   White Petrolatum-Mineral Oil (ARTIFICIAL TEARS) ointment as needed.   No facility-administered encounter medications on file as of 10/05/2021.    Objective:   PHYSICAL EXAMINATION:    VITALS:   Vitals:   10/05/21 0909  BP: 118/72  Pulse: 63  SpO2: 99%  Weight: 126 lb 12.8 oz (57.5 kg)  Height: '5\' 4"'$  (1.626 m)      GEN:  The patient appears stated age and is in NAD. HEENT:  Normocephalic, atraumatic.  The mucous membranes are moist.  Mild single fasciculation at the inferior orbicularis oculi  Neurological examination:  Orientation: The patient is alert and oriented x3. Cranial nerves: There is good facial symmetry with minimal facial hypomimia. The speech is fluent and clear. Soft palate rises symmetrically and there is no tongue deviation. Hearing is intact to conversational tone. Sensation: Sensation is intact to light touch throughout Motor: Strength is at least antigravity x4.   Movement examination: Tone: There is nl tone Abnormal movements: No rest tremor at all today.  She has very minimal postural tremor bilaterally.  Minimal intention tremor Coordination:  There is no decremation with any form of RAMS, including alternating supination and pronation of the forearm, hand opening and closing, finger taps, heel taps and toe taps.  Gait and Station: The patient has no difficulty arising out of a deep-seated chair without the use of the hands. The patient's stride length is good but she does have left shoulder higher than the right.  She has marked decreased arm swing bilaterally, left greater than right.    I have reviewed and interpreted the following labs independently    Chemistry      Component Value Date/Time   NA 141 04/09/2021 1716   NA 139 01/26/2020 0000   NA 141 04/24/2012 0929   K 4.7 04/09/2021 1716   K 4.4 04/24/2012 0929   CL 109 04/09/2021 1716   CL 109 (H) 04/24/2012 0929   CO2 24 04/09/2021 1716   CO2 23 04/24/2012 0929   BUN 21  04/09/2021 1716   BUN 19 01/26/2020 0000   BUN 20.6 04/24/2012 0929   CREATININE 0.99 (H) 04/09/2021 1716   CREATININE 0.9 04/24/2012 0929   GLU 82 01/26/2020 0000      Component Value Date/Time   CALCIUM 10.7 (H) 04/09/2021 1716   CALCIUM 10.5 (H) 01/12/2020 2123   CALCIUM 10.0 04/24/2012 0929   ALKPHOS 81 01/26/2020 0000   ALKPHOS 75 04/24/2012 0929   AST 20 04/09/2021 1716   AST 20 04/24/2012 0929   ALT 11 04/09/2021 1716   ALT 12 04/24/2012 0929   BILITOT 0.6 04/09/2021 1716   BILITOT 0.36 04/24/2012 0929       Lab Results  Component Value Date   WBC 6.5 04/09/2021   HGB 12.8 04/09/2021   HCT  38.6 04/09/2021   MCV 97.7 04/09/2021   PLT 306 04/09/2021    Lab Results  Component Value Date   TSH 2.08 04/09/2021     Total time spent on today's visit was 30 minutes, including both face-to-face time and nonface-to-face time.  Time included that spent on review of records (prior notes available to me/labs/imaging if pertinent), discussing treatment and goals, answering patient's questions and coordinating care.  Cc:  Mast, Man X, NP

## 2021-10-05 ENCOUNTER — Ambulatory Visit: Payer: Medicare PPO | Admitting: Neurology

## 2021-10-05 ENCOUNTER — Encounter: Payer: Self-pay | Admitting: Neurology

## 2021-10-05 VITALS — BP 118/72 | HR 63 | Ht 64.0 in | Wt 126.8 lb

## 2021-10-05 DIAGNOSIS — F319 Bipolar disorder, unspecified: Secondary | ICD-10-CM | POA: Diagnosis not present

## 2021-10-05 DIAGNOSIS — G251 Drug-induced tremor: Secondary | ICD-10-CM | POA: Diagnosis not present

## 2021-10-05 DIAGNOSIS — G243 Spasmodic torticollis: Secondary | ICD-10-CM | POA: Diagnosis not present

## 2021-10-05 DIAGNOSIS — M5030 Other cervical disc degeneration, unspecified cervical region: Secondary | ICD-10-CM

## 2021-10-05 DIAGNOSIS — M542 Cervicalgia: Secondary | ICD-10-CM | POA: Diagnosis not present

## 2021-10-05 DIAGNOSIS — G249 Dystonia, unspecified: Secondary | ICD-10-CM

## 2021-10-05 DIAGNOSIS — R29898 Other symptoms and signs involving the musculoskeletal system: Secondary | ICD-10-CM | POA: Diagnosis not present

## 2021-10-05 DIAGNOSIS — R253 Fasciculation: Secondary | ICD-10-CM | POA: Diagnosis not present

## 2021-10-05 DIAGNOSIS — G20 Parkinson's disease: Secondary | ICD-10-CM

## 2021-10-09 DIAGNOSIS — G249 Dystonia, unspecified: Secondary | ICD-10-CM | POA: Diagnosis not present

## 2021-10-09 DIAGNOSIS — R29898 Other symptoms and signs involving the musculoskeletal system: Secondary | ICD-10-CM | POA: Diagnosis not present

## 2021-10-09 DIAGNOSIS — M542 Cervicalgia: Secondary | ICD-10-CM | POA: Diagnosis not present

## 2021-10-10 ENCOUNTER — Encounter: Payer: Self-pay | Admitting: Family Medicine

## 2021-10-10 ENCOUNTER — Non-Acute Institutional Stay: Payer: Medicare PPO | Admitting: Family Medicine

## 2021-10-10 VITALS — BP 118/70 | HR 61 | Temp 97.2°F | Wt 126.8 lb

## 2021-10-10 DIAGNOSIS — F304 Manic episode in full remission: Secondary | ICD-10-CM

## 2021-10-10 DIAGNOSIS — B370 Candidal stomatitis: Secondary | ICD-10-CM | POA: Insufficient documentation

## 2021-10-10 DIAGNOSIS — G243 Spasmodic torticollis: Secondary | ICD-10-CM | POA: Diagnosis not present

## 2021-10-10 DIAGNOSIS — F411 Generalized anxiety disorder: Secondary | ICD-10-CM

## 2021-10-10 MED ORDER — MAGIC MOUTHWASH W/LIDOCAINE: 5.0000 mL | Freq: Three times a day (TID) | ORAL | Status: DC | PRN

## 2021-10-10 NOTE — Progress Notes (Signed)
Provider:  Alain Honey, MD  Careteam: Patient Care Team: Mast, Man X, NP as PCP - General (Internal Medicine) Marti Sleigh, MD as Consulting Physician (Gynecologic Oncology) Ladell Pier, MD as Consulting Physician (Oncology) Richmond Campbell, MD as Consulting Physician (Gastroenterology) Clent Jacks, MD as Consulting Physician (Ophthalmology) Sabra Heck Precious Haw[facility], MD as Referring Physician (Neurology) Tat, Eustace Quail, DO as Consulting Physician (Neurology)  PLACE OF SERVICE:  Agency  Advanced Directive information    Allergies  Allergen Reactions   Lisinopril Cough   Penicillins Itching    50 years ago   Adhesive [Tape] Itching   Atorvastatin Itching   Dilaudid [Hydromorphone Hcl] Itching   Hydromorphone Itching    No chief complaint on file.    HPI: Patient is a 81 y.o. female patient has history of bipolar disorder for which she takes lithium 150 mg.  That had been reduced from 300 mg twice daily.  Also has history of chronic kidney disease, chronic and migraine headaches, spasmodic Torticollis, and hypertension.  She has been receiving botulinum toxin in her neck for the torticollis but cannot say that that has helped or not. Also recently had corneal abrasion that was treated by ophthalmology.  She now has some sort of butterfly image in her right eye.  Unsure etiology as she describes it.  Ophthalmology has not been able to identify source. Regarding bipolar disorder lithium levels had been checked once a year over the last several years but not this year.  Even though she is on a low-dose at 150 mg twice daily I think this should be checked along with her renal function. She also takes topiramate for migraine and migraine prevention.  Has lost weight but weight is still normal for her height.  Review of Systems:  Review of Systems  Eyes:  Positive for photophobia.  Respiratory: Negative.    Cardiovascular: Negative.   Gastrointestinal:  Negative.   Genitourinary: Negative.   Musculoskeletal:  Positive for neck pain.    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[place])    Heart murmur    History of colon polyps 10/24/2008   Hypertension    Major neurocognitive disorder due to Parkinson's disease, possible    Tremors possible parkinsons   Manic disorder, single episode, in full remission ([place]) 12/01/2009   Sixth nerve palsy    Past Surgical History:  Procedure Laterality Date   BILATERAL SALPINGOOPHORECTOMY  09/22/2007   CATARACT EXTRACTION Bilateral    Gastrointestinal Stroma Tumor,  Other  1960   GIST Surgery   ILEOCECETOMY  09/22/2007   OVARIAN CYST REMOVAL Right 1967   SMALL INTESTINE SURGERY  09/22/2007   TOTAL KNEE ARTHROPLASTY Right 10/05/2019   Procedure: RIGHT TOTAL KNEE ARTHROPLASTY;  Surgeon: Meredith Pel, MD;  Location: [facility];  Service: Orthopedics;  Laterality: Right;   TOTAL VAGINAL HYSTERECTOMY  1986   Fibroids   Social History:   reports that she has never smoked. She has never used smokeless tobacco. She reports that she does not drink alcohol and does not use drugs.  Family History  Problem Relation Age of Onset   Congestive Heart Failure Mother  Dementia Mother    Heart disease Father    Diabetes Father    Lung cancer Son     Medications: Patient's Medications  New Prescriptions   No medications on file  Previous Medications   ALPRAZOLAM (XANAX) 0.5 MG TABLET    TAKE ONE TABLET BY MOUTH TWICE DAILY AS NEEDED FOR ANXIETY   ASPIRIN 81 MG CHEWABLE TABLET    Chew by mouth daily.   BOTULINUM TOXIN TYPE A (BOTOX) 100 UNITS SOLR INJECTION    Inject 300 units into  the head and neck every 90 days by Dr. Wells Guiles Tat   CLINDAMYCIN (CLEOCIN) 150 MG CAPSULE    Take '600mg'$  1 hour prior to dental procedure   DORZOLAMIDE (TRUSOPT) 2 % OPHTHALMIC SOLUTION    1 drop 3 (three) times daily.   FEXOFENADINE (ALLEGRA) 60 MG TABLET    Take 60 mg by mouth 2 (two) times daily as needed.   LATANOPROST (XALATAN) 0.005 % OPHTHALMIC SOLUTION    INSTILL 1 DROP IN [facility] EYE AT BEDTIME.   LITHIUM CARBONATE 150 MG CAPSULE    TAKE 1 CAPSULE TWICE DAILY WITH MEALS.   LORATADINE (CLARITIN) 10 MG TABLET    Take 1 tablet (10 mg total) by mouth daily.   LOSARTAN (COZAAR) 50 MG TABLET    TAKE ONE TABLET BY MOUTH ONCE DAILY   MAGNESIUM PO    Take by mouth.   MULTIPLE VITAMIN (MULTIVITAMIN) CAPSULE    Take 1 capsule by mouth daily.   OMEPRAZOLE (PRILOSEC) 20 MG CAPSULE    TAKE (1) CAPSULE TWICE DAILY.   TIMOLOL (BETIMOL) 0.25 % OPHTHALMIC SOLUTION    1-2 drops 2 (two) times daily.   TOPIRAMATE (TOPAMAX) 25 MG CAPSULE    Take 25 mg by mouth 2 (two) times daily.   TRIAMCINOLONE CREAM (KENALOG) 0.5 %    Apply 1 Application topically 2 (two) times daily.   WHITE PETROLATUM-MINERAL OIL (ARTIFICIAL TEARS) OINTMENT    as needed.  Modified Medications   No medications on file  Discontinued Medications   No medications on file    Physical Exam:  Vitals:   10/10/21 1431  BP: 118/70  Temp: (!) 97.2 F (36.2 C)  Weight: 126 lb 12.8 oz (57.5 kg)   Body mass index is 21.77 kg/m. Wt Readings from Last 3 Encounters:  10/10/21 126 lb 12.8 oz (57.5 kg)  10/05/21 126 lb 12.8 oz (57.5 kg)  07/04/21 131 lb 6.4 oz (59.6 kg)    Physical Exam Vitals and nursing note reviewed.  Constitutional:      Appearance: Normal appearance.     Comments: There is a certain lack of facial expression and possibility of movement which at least raises suspicion of Parkinson's  Neck:     Comments: Neck is stiff secondary to the dystonia for which she receives botulinum injections monthly Cardiovascular:      Rate and Rhythm: Normal rate and regular rhythm.  Pulmonary:     Breath sounds: Normal breath sounds.  Lymphadenopathy:     Cervical: Cervical adenopathy present.  Neurological:     General: No focal deficit present.     Mental Status: She is alert and oriented to person, place, and time.  Psychiatric:        Mood and Affect: Mood normal.        Thought Content: Thought content normal.     Labs reviewed: Basic Metabolic Panel: Recent Labs    04/09/21 1716  NA 141  K 4.7  CL  109  CO2 24  GLUCOSE 93  BUN 21  CREATININE 0.99*  CALCIUM 10.7*  TSH 2.08   Liver Function Tests: Recent Labs    04/09/21 1716  AST 20  ALT 11  BILITOT 0.6  PROT 6.6   No results for input(s): "LIPASE", "AMYLASE" in the last 8760 hours. No results for input(s): "AMMONIA" in the last 8760 hours. CBC: Recent Labs    04/09/21 1716  WBC 6.5  NEUTROABS 4,485  HGB 12.8  HCT 38.6  MCV 97.7  PLT 306   Lipid Panel: Recent Labs    04/09/21 1716  CHOL 271*  HDL 67  LDLCALC 166*  TRIG 213*  CHOLHDL 4.0   TSH: Recent Labs    04/09/21 1716  TSH 2.08   A1C: No results found for: "HGBA1C"   Assessment/Plan  1. Bipolar I disorder, single manic episode, in full remission ([place]) Doing well on lithium alone but will check blood level today  2. Thrush She had complained of sore throat and desired some treatment.  As I did exam there is a white coating on tongue and throat is injected suspicious for oral thrush we will treat with Magic mouthwash  3. Anxiety state Patient takes alprazolam but she is concerned about use of this drug.  She takes 0.5 mg.  I suggested that she may break it in half and try 0.25 to see if that relieved her symptoms; she seemed pleased with this suggestion  4. Cervical dystonia Continue with botulinum injections per neurology   Alain Honey, MD [facility] [phone]

## 2021-10-11 ENCOUNTER — Other Ambulatory Visit: Payer: Medicare PPO

## 2021-10-11 DIAGNOSIS — F304 Manic episode in full remission: Secondary | ICD-10-CM

## 2021-10-11 MED ORDER — MAGIC MOUTHWASH W/LIDOCAINE: 5.0000 mL | Freq: Three times a day (TID) | ORAL | 0 refills | Status: DC | PRN

## 2021-10-11 NOTE — Addendum Note (Signed)
Addended by: Logan Bores on: 10/11/2021 04:50 PM   Modules accepted: Orders

## 2021-10-12 DIAGNOSIS — M542 Cervicalgia: Secondary | ICD-10-CM | POA: Diagnosis not present

## 2021-10-12 DIAGNOSIS — G249 Dystonia, unspecified: Secondary | ICD-10-CM | POA: Diagnosis not present

## 2021-10-12 DIAGNOSIS — R29898 Other symptoms and signs involving the musculoskeletal system: Secondary | ICD-10-CM | POA: Diagnosis not present

## 2021-10-12 LAB — LITHIUM LEVEL: Lithium Lvl: 0.4 mmol/L — ABNORMAL LOW (ref 0.6–1.2)

## 2021-10-16 ENCOUNTER — Emergency Department (HOSPITAL_BASED_OUTPATIENT_CLINIC_OR_DEPARTMENT_OTHER)
Admission: EM | Admit: 2021-10-16 | Discharge: 2021-10-16 | Disposition: A | Discharge status: Home / Self Care | Payer: Medicare PPO | Attending: Emergency Medicine | Admitting: Emergency Medicine

## 2021-10-16 ENCOUNTER — Encounter (HOSPITAL_BASED_OUTPATIENT_CLINIC_OR_DEPARTMENT_OTHER): Payer: Self-pay

## 2021-10-16 ENCOUNTER — Emergency Department (HOSPITAL_BASED_OUTPATIENT_CLINIC_OR_DEPARTMENT_OTHER): Payer: Medicare PPO

## 2021-10-16 ENCOUNTER — Other Ambulatory Visit: Payer: Self-pay

## 2021-10-16 DIAGNOSIS — W01198A Fall on same level from slipping, tripping and stumbling with subsequent striking against other object, initial encounter: Secondary | ICD-10-CM | POA: Insufficient documentation

## 2021-10-16 DIAGNOSIS — R001 Bradycardia, unspecified: Secondary | ICD-10-CM | POA: Diagnosis not present

## 2021-10-16 DIAGNOSIS — Z7982 Long term (current) use of aspirin: Secondary | ICD-10-CM | POA: Diagnosis not present

## 2021-10-16 DIAGNOSIS — S0101XA Laceration without foreign body of scalp, initial encounter: Secondary | ICD-10-CM

## 2021-10-16 DIAGNOSIS — Y92019 Unspecified place in single-family (private) house as the place of occurrence of the external cause: Secondary | ICD-10-CM | POA: Diagnosis not present

## 2021-10-16 DIAGNOSIS — Y92009 Unspecified place in unspecified non-institutional (private) residence as the place of occurrence of the external cause: Secondary | ICD-10-CM

## 2021-10-16 DIAGNOSIS — I6789 Other cerebrovascular disease: Secondary | ICD-10-CM | POA: Diagnosis not present

## 2021-10-16 DIAGNOSIS — S0990XA Unspecified injury of head, initial encounter: Secondary | ICD-10-CM | POA: Diagnosis present

## 2021-10-16 DIAGNOSIS — M47812 Spondylosis without myelopathy or radiculopathy, cervical region: Secondary | ICD-10-CM | POA: Diagnosis not present

## 2021-10-16 MED ORDER — LIDOCAINE-EPINEPHRINE (PF) 2 %-1:200000 IJ SOLN
10.0000 mL | Freq: Once | INTRAMUSCULAR | Status: AC
  Administered 2021-10-16: 10 mL via INTRADERMAL
  Filled 2021-10-16: qty 20

## 2021-10-16 NOTE — ED Triage Notes (Addendum)
Pt states that her feet got tripped up in the carpet, falling and hitting right side of head. Bleeding controlled in triage, was cleaned PTA. No thinners, no LOC .  Pt lives on [facility] side of [facility].

## 2021-10-16 NOTE — ED Notes (Signed)
Discharge paperwork given and verbally understood. 

## 2021-10-16 NOTE — ED Notes (Signed)
Small laceration/avulsion to the back of her head. Swelling to the back of her head. Bleeding controlled. No blurry vision, no numbness/tingling or LOC. Able to move all extremities as her normal. CAOx4.

## 2021-10-16 NOTE — Discharge Instructions (Signed)
You have been evaluated for your fall.  You suffered a laceration to the back of your scalp.  This was repaired using surgical staple.  Keep this wound dry for the first 24 hours after that you may shampoo your hair as usual and please follow-up with your doctor in the next 5 to 7 days for staple removal.  You may take over-the-counter Tylenol as needed for pain.

## 2021-10-16 NOTE — ED Provider Notes (Signed)
[facility] EMERGENCY DEPT Provider Note   CSN: [id number] Arrival date & time: 10/16/21  1309     History  Chief Complaint  Patient presents with   Fall    Tracey [name] is a 81 y.o. female.  The history is provided by the patient and medical records. No language interpreter was used.  Fall     81 year old female significant care for arthritis, neurocognitive disorder, cervical dystonia presenting with complaint of a fall.  [facility]  Patient reports she accidentally tripped on a carpet, and fell striking the back of her head against the ground.  Incident happened a few hours ago.  She denies any loss of consciousness.  She was able to get up on her own.  She endorsed mild tenderness to the back of her head but otherwise denies any significant headache, neck pain, pain to extremities lightheadedness dizziness nausea or vomiting.  She denies any precipitating symptoms prior to the fall.  She is not on any blood thinner medication.  No specific treatment tried.  No recent change in medication.  She denies any confusion.  She normally walks without using any assistive device.  Home Medications Prior to Admission medications   Medication Sig Start Date End Date Taking? Authorizing Provider  ALPRAZolam (XANAX) 0.5 MG tablet TAKE ONE TABLET BY MOUTH TWICE DAILY AS NEEDED FOR ANXIETY 09/11/21   Mast, Man X, NP  aspirin 81 MG chewable tablet Chew by mouth daily.    [provider]  botulinum toxin Type A (BOTOX) 100 units SOLR injection Inject 300 units into the head and neck every 90 days by Dr. Wells Guiles Tat 08/28/21   Tat, Eustace Quail, DO  clindamycin (CLEOCIN) 150 MG capsule Take '600mg'$  1 hour prior to dental procedure 03/09/20   Meredith Pel, MD  dorzolamide (TRUSOPT) 2 % ophthalmic solution 1 drop 3 (three) times daily.    [provider]  fexofenadine (ALLEGRA) 60 MG tablet Take 60 mg by mouth 2 (two) times  daily as needed.    [provider]  latanoprost (XALATAN) 0.005 % ophthalmic solution INSTILL 1 DROP IN [facility] EYE AT BEDTIME. 04/05/20   [name], MD  lithium carbonate 150 MG capsule TAKE 1 CAPSULE TWICE DAILY WITH MEALS. 05/21/21   Mast, Man X, NP  loratadine (CLARITIN) 10 MG tablet Take 1 tablet (10 mg total) by mouth daily. 12/28/20   Mast, Man X, NP  losartan (COZAAR) 50 MG tablet TAKE ONE TABLET BY MOUTH ONCE DAILY 08/22/21   Mast, Man X, NP  magic mouthwash w/lidocaine SOLN Take 5 mLs by mouth 3 (three) times daily as needed for mouth pain. 10/11/21   Medina-Vargas, Monina C, NP  MAGNESIUM PO Take by mouth.    [provider]  Multiple Vitamin (MULTIVITAMIN) capsule Take 1 capsule by mouth daily.    [provider]  omeprazole (PRILOSEC) 20 MG capsule TAKE (1) CAPSULE TWICE DAILY. 02/02/21   Mast, Man X, NP  timolol (BETIMOL) 0.25 % ophthalmic solution 1-2 drops 2 (two) times daily.    [provider]  topiramate (TOPAMAX) 25 MG capsule Take 25 mg by mouth 2 (two) times daily.    [provider]  triamcinolone cream (KENALOG) 0.5 % Apply 1 Application topically 2 (two) times daily. 07/06/21   Wardell Honour, MD  White Petrolatum-Mineral Oil (ARTIFICIAL TEARS) ointment as needed.    [provider]      Allergies  Lisinopril, Penicillins, Adhesive [tape], Atorvastatin, Dilaudid [hydromorphone hcl], and Hydromorphone    Review of Systems   Review of Systems  All other systems reviewed and are negative.   Physical Exam Updated Vital Signs BP (!) 153/85 (BP Location: Right Arm)   Pulse (!) 51   Temp 97.6 F (36.4 C) (Oral)   Resp 16   Ht '5\' 3"'$  (1.6 m)   Wt 57.2 kg   SpO2 100%   BMI 22.32 kg/m  Physical Exam Vitals and nursing note reviewed.  Constitutional:      General: She is not in acute distress.    Appearance: She is well-developed.  HENT:     Head: Normocephalic.     Comments: Scalp: 2 cm vertical laceration  noted to the occiput of scalp with mild tenderness to palpation but no crepitus and not actively bleeding.  No foreign body noted. Eyes:     Extraocular Movements: Extraocular movements intact.     Conjunctiva/sclera: Conjunctivae normal.     Pupils: Pupils are equal, round, and reactive to light.  Neck:     Comments: No midline cervical spine tenderness. Cardiovascular:     Rate and Rhythm: Bradycardia present.  Pulmonary:     Effort: Pulmonary effort is normal.  Abdominal:     Palpations: Abdomen is soft.     Tenderness: There is no abdominal tenderness.  Musculoskeletal:     Cervical back: Normal range of motion and neck supple. No tenderness.  Skin:    Findings: No rash.  Neurological:     Mental Status: She is alert and oriented to person, place, and time.     GCS: GCS eye subscore is 4. GCS verbal subscore is 5. GCS motor subscore is 6.     Cranial Nerves: Cranial nerves 2-12 are intact.     Sensory: Sensation is intact.     Motor: Motor function is intact.  Psychiatric:        Mood and Affect: Mood normal.     ED Results / Procedures / Treatments   Labs (all labs ordered are listed, but only abnormal results are displayed) Labs Reviewed - No data to display  EKG EKG Interpretation  Date/Time:  Tuesday October 16 2021 17:03:58 EDT Ventricular Rate:  50 PR Interval:  176 QRS Duration: 90 QT Interval:  458 QTC Calculation: 418 R Axis:   58 Text Interpretation: Sinus rhythm Low voltage, precordial leads Confirmed by Malvin Johns [phone]) on 10/16/2021 5:07:50 PM  Radiology No results found.  Procedures .Marland KitchenLaceration Repair  Date/Time: 10/16/2021 4:48 PM  Performed by: Domenic Moras, PA-C Authorized by: Domenic Moras, PA-C   Consent:    Consent obtained:  Verbal   Consent given by:  Patient   Risks discussed:  Infection, need for additional repair, pain, poor cosmetic result and poor wound healing   Alternatives discussed:  No treatment and delayed  treatment Universal protocol:    Procedure explained and questions answered to patient or proxy's satisfaction: yes     Relevant documents present and verified: yes     Test results available: yes     Imaging studies available: yes     Required blood products, implants, devices, and special equipment available: yes     Site/side marked: yes     Immediately prior to procedure, a time out was called: yes     Patient identity confirmed:  Verbally with patient Anesthesia:    Anesthesia method:  Local infiltration   Local anesthetic:  Lidocaine 2% WITH  epi Laceration details:    Location:  Scalp   Scalp location:  Occipital   Length (cm):  2   Depth (mm):  4 Pre-procedure details:    Preparation:  Patient was prepped and draped in usual sterile fashion and imaging obtained to evaluate for foreign bodies Exploration:    Limited defect created (wound extended): no     Hemostasis achieved with:  Direct pressure   Imaging outcome: foreign body not noted     Wound exploration: wound explored through full range of motion and entire depth of wound visualized     Contaminated: no   Treatment:    Area cleansed with:  Povidone-iodine   Irrigation solution:  Sterile saline   Irrigation method:  Pressure wash   Visualized foreign bodies/material removed: no     Debridement:  None   Undermining:  None   Scar revision: no   Skin repair:    Repair method:  Staples   Number of staples:  2 Approximation:    Approximation:  Close Repair type:    Repair type:  Simple Post-procedure details:    Dressing:  Open (no dressing)   Procedure completion:  Tolerated well, no immediate complications     Medications Ordered in ED Medications  lidocaine-EPINEPHrine (XYLOCAINE W/EPI) 2 %-1:200000 (PF) injection 10 mL (10 mLs Intradermal Given by Other 10/16/21 1523)    ED Course/ Medical Decision Making/ A&P                           Medical Decision Making Amount and/or Complexity of Data  Reviewed Radiology: ordered. ECG/medicine tests: ordered.  Risk Prescription drug management.   BP (!) 153/85 (BP Location: Right Arm)   Pulse (!) 51   Temp 97.6 F (36.4 C) (Oral)   Resp 16   Ht '5\' 3"'$  (1.6 m)   Wt 57.2 kg   SpO2 100%   BMI 22.32 kg/m   3:13 PM This is an 81 year old female presenting for evaluation of a fall.  Patient tripped on carpet and fell landed onto the ground and struck the back of her head and cervical small laceration.  Incident happened a few hours ago.  She denies any precipitating symptoms prior to the fall.  She denies any specific treatment tried.  She is up-to-date with tetanus.  She reports pain is minimal.  She does not endorse any other injury.  She walks without assistance.  On exam this is a well-appearing elderly female resting comfortably in bed appears to be in no acute discomfort.  Exam remarkable for a 2 cm laceration to the occipital scalp with mild tenderness but no crepitus or step-off noted.  It is not actively bleeding.  She does not have any other reproducible pain signs of injury.  No midline spine tenderness.  Heart lung sounds normal.  Mild bradycardia noted.  Patient able to move all 4 extremities and follow commands appropriately.  GCS of 15.  Given age, will obtain head and cervical spine CT, and will perform scalp lac repaired using surgical staple.  4:49 PM Surgical staple was applied to the scalp laceration with good cosmesis.  Patient was noted to be bradycardic with a heart rate of 49.  She admits she has always had slow heart rate and she does not exhibit any symptoms concerning for symptomatic bradycardia.  Will obtain EKG to ensure no heart block.  EKG obtained independently viewed by me shows sinus bradycardia.  This is  normal for patient.  No evidence of concerning heart block.  At this time she is stable to be discharged home.  Patient able to ambulate  This patient presents to the ED for concern of fall, this involves  an extensive number of treatment options, and is a complaint that carries with it a high risk of complications and morbidity.  The differential diagnosis includes mechanical fall, cardiac arrhythmia, hypovolemia, stroke, acs  Co morbidities that complicate the patient evaluation bradycardia Additional history obtained:  Additional history obtained from son External records from outside source obtained and reviewed including EMR  Lab Tests:  I Ordered, and personally interpreted labs.  The pertinent results include:  as above  Imaging Studies ordered:  I ordered imaging studies including head/cspine CT I independently visualized and interpreted imaging which showed no acute finding I agree with the radiologist interpretation  Cardiac Monitoring:  The patient was maintained on a cardiac monitor.  I personally viewed and interpreted the cardiac monitored which showed an underlying rhythm of: sinus bradycardia  Medicines ordered and prescription drug management:  I ordered medication including lidocaine  for laceration Reevaluation of the patient after these medicines showed that the patient improved I have reviewed the patients home medicines and have made adjustments as needed  Test Considered: as above  Critical Interventions: scalp lac repair   Problem List / ED Course: mechanical fall  Scalp lac  Reevaluation:  After the interventions noted above, I reevaluated the patient and found that they have :improved  Social Determinants of Health: none  Dispostion:  After consideration of the diagnostic results and the patients response to treatment, I feel that the patent would benefit from outpt f/u.         Final Clinical Impression(s) / ED Diagnoses Final diagnoses:  Fall at home, initial encounter  Occipital scalp laceration, initial encounter    Rx / DC Orders ED Discharge Orders     None         Domenic Moras, PA-C 10/16/21 1711    Malvin Johns, MD 10/16/21 2241

## 2021-10-18 ENCOUNTER — Encounter: Payer: Self-pay | Admitting: Nurse Practitioner

## 2021-10-18 ENCOUNTER — Non-Acute Institutional Stay: Payer: Medicare PPO | Admitting: Nurse Practitioner

## 2021-10-18 ENCOUNTER — Encounter: Payer: Medicare PPO | Admitting: Nurse Practitioner

## 2021-10-18 DIAGNOSIS — H9313 Tinnitus, bilateral: Secondary | ICD-10-CM

## 2021-10-18 DIAGNOSIS — R001 Bradycardia, unspecified: Secondary | ICD-10-CM

## 2021-10-18 DIAGNOSIS — F304 Manic episode in full remission: Secondary | ICD-10-CM

## 2021-10-18 DIAGNOSIS — S0101XA Laceration without foreign body of scalp, initial encounter: Secondary | ICD-10-CM | POA: Insufficient documentation

## 2021-10-18 DIAGNOSIS — S0101XD Laceration without foreign body of scalp, subsequent encounter: Secondary | ICD-10-CM

## 2021-10-18 DIAGNOSIS — G243 Spasmodic torticollis: Secondary | ICD-10-CM

## 2021-10-18 DIAGNOSIS — K5901 Slow transit constipation: Secondary | ICD-10-CM

## 2021-10-18 DIAGNOSIS — R609 Edema, unspecified: Secondary | ICD-10-CM | POA: Diagnosis not present

## 2021-10-18 DIAGNOSIS — C49A3 Gastrointestinal stromal tumor of small intestine: Secondary | ICD-10-CM | POA: Diagnosis not present

## 2021-10-18 DIAGNOSIS — I10 Essential (primary) hypertension: Secondary | ICD-10-CM

## 2021-10-18 DIAGNOSIS — R4189 Other symptoms and signs involving cognitive functions and awareness: Secondary | ICD-10-CM

## 2021-10-18 DIAGNOSIS — E785 Hyperlipidemia, unspecified: Secondary | ICD-10-CM

## 2021-10-18 DIAGNOSIS — K219 Gastro-esophageal reflux disease without esophagitis: Secondary | ICD-10-CM

## 2021-10-18 DIAGNOSIS — M159 Polyosteoarthritis, unspecified: Secondary | ICD-10-CM

## 2021-10-18 NOTE — Assessment & Plan Note (Signed)
Sustained from a mechanical fall at home,  s/p fall ED eval 10/16/21, stapled x2, CT head/cervical spine, EKG unremarkable. No active bleeding or s/s of infection.

## 2021-10-18 NOTE — Assessment & Plan Note (Signed)
blood pressure is controlled on Losartan 

## 2021-10-18 NOTE — Assessment & Plan Note (Signed)
LLE negative DVT venous US, L>R. Medial thickened swelling area about her palm sized, mild warmth, redness, and indurated area for a few month. Varicose veins LLE not new.  

## 2021-10-18 NOTE — Assessment & Plan Note (Signed)
f/u endocrinology, Ca 10.7 04/09/21,  PTH 79 01/12/20. GDR of Lithium may help 

## 2021-10-18 NOTE — Assessment & Plan Note (Signed)
stable, on Lithium, GDR due to bradycardia, takes Alprazolam too, TSH 2.08 04/09/21

## 2021-10-18 NOTE — Assessment & Plan Note (Signed)
LDL 166 04/09/21, on diet, allergic to statin.  

## 2021-10-18 NOTE — Assessment & Plan Note (Signed)
improved from 40s to 50ss. Hx of normalized HR after Tylenol PM dc'd, GDR of Lithium helped too.

## 2021-10-18 NOTE — Assessment & Plan Note (Signed)
takes  Tylenol, s/p R knee arthroplasty.  

## 2021-10-18 NOTE — Progress Notes (Signed)
Location:   clinic [facility]   Place of Service:  Clinic (12) Provider: Chipper Oman NP  Code Status: DNR Goals of Care: [place]    10/16/2021    1:22 PM  Advanced Directives  Does Patient Have a Medical Advance Directive? No     Chief Complaint  Patient presents with   Acute Visit    F/u ED eval, scalp laceration, s/p fall    HPI: Patient is a 81 y.o. female seen today for s/p fall ED eval 10/16/21, stapled x2, CT head/cervical spine, EKG unremarkable.    Edema, LLE negative DVT venous US, L>R. Medial thickened swelling area about her palm sized, mild warmth, redness, and indurated area for a few month. Varicose veins LLE not new.  Tinnitus, underwent eval of  ENT, suggested music background.              Hx of GIST of small intestine s/p resection. prn MiraLax. Daily Psyllium .             Constipation. Prn Metamucil, MiraLax qd are effective.              Stable(diplopia R+L lateral,  tremor in fingers much improved), on Sinemet f/u Neurology.              GERD, stable, on Omeprazole, Hgb 12.8 04/09/21             HTN, blood pressure is controlled on Losartan             Bipolar disorder, stable, on Lithium, GDR due to bradycardia, takes Alprazolam too, TSH 2.08 04/09/21             Bradycardia, improved from 40s to 50ss. Hx of normalized HR after Tylenol PM dc'd, GDR of Lithium helped too.              Hypercalcemia, f/u endocrinology, Ca 10.7 04/09/21,  PTH 79 01/12/20. GDR of Lithium may help             OA, takes  Tylenol, s/p R knee arthroplasty.              Cervical dystonia, no better, s/p  inj Botox inj, f/u neurology, pain clinic. No longer taking Zongran and stopped going to [facility]. No Parkinson's disease, took her off Sinemet per Neurology.  Cervical degenerative changes/dystonia, CT/MRI 12/2019 ruled out acute process, aches in neck R>L travels to the back of head, comes and goes, not disabling, f/u Neurosurgery. Failed Cymbalta, Gabapentin, Robaxin, Ultracet. s/p Botulinum  Toxin inj by Neurology. ESR 9, CRP 2.7 10/09/20. The patient stated Alprazolam and Tylenol are kind of effective. Improved on Topamax, but associated with weight loss.              Cognitive impairment: 12/2019 MRI brain showed chronic microvascular ischemic changes, no acute disease with mild cortical atrophy             Hyperlipidemia, LDL 166 04/09/21, on diet, allergic to statin.  Past Medical History:  Diagnosis Date   Anxiety    Arthritis    Cardiac conduction disorder 03/30/2012   Overview:  STORY: ETT 03/09/2012 Echo 03/07/2012 normal Dr Jacinto Halim, bradycardia felt due to glaucoma eye drops   Cervical dystonia    Diverticulosis of colon    GERD (gastroesophageal reflux disease)    GIST (gastrointestinal stroma tumor), malignant, colon (HCC)    Heart murmur    History of colon polyps 10/24/2008   Hypertension    Major  neurocognitive disorder due to Parkinson's disease, possible    Tremors possible parkinsons   Manic disorder, single episode, in full remission ([place]) 12/01/2009   Sixth nerve palsy     Past Surgical History:  Procedure Laterality Date   BILATERAL SALPINGOOPHORECTOMY  09/22/2007   CATARACT EXTRACTION Bilateral    Gastrointestinal Stroma Tumor,  Other  1960   GIST Surgery   ILEOCECETOMY  09/22/2007   OVARIAN CYST REMOVAL Right 1967   SMALL INTESTINE SURGERY  09/22/2007   TOTAL KNEE ARTHROPLASTY Right 10/05/2019   Procedure: RIGHT TOTAL KNEE ARTHROPLASTY;  Surgeon: Meredith Pel, MD;  Location: [facility];  Service: Orthopedics;  Laterality: Right;   TOTAL VAGINAL HYSTERECTOMY  1986   Fibroids    Allergies  Allergen Reactions   Lisinopril Cough   Penicillins Itching    50 years ago   Adhesive [Tape] Itching   Atorvastatin Itching   Dilaudid [Hydromorphone Hcl] Itching   Hydromorphone Itching    Allergies as of 10/18/2021       Reactions   Lisinopril Cough   Penicillins Itching   50 years ago   Adhesive [tape] Itching   Atorvastatin Itching   Dilaudid  [hydromorphone Hcl] Itching   Hydromorphone Itching        Medication List        Accurate as of October 18, 2021 11:59 PM. If you have any questions, ask your nurse or doctor.          ALPRAZolam 0.5 MG tablet Commonly known as: XANAX TAKE ONE TABLET BY MOUTH TWICE DAILY AS NEEDED FOR ANXIETY   artificial tears ointment as needed.   aspirin 81 MG chewable tablet Chew by mouth daily.   Botox 100 units Solr injection Generic drug: botulinum toxin Type A Inject 300 units into the head and neck every 90 days by Dr. Wells Guiles Tat   clindamycin 150 MG capsule Commonly known as: CLEOCIN Take $RemoveBefo'600mg'[id number]$  1 hour prior to dental procedure   dorzolamide 2 % ophthalmic solution Commonly known as: TRUSOPT 1 drop 3 (three) times daily.   fexofenadine 60 MG tablet Commonly known as: ALLEGRA Take 60 mg by mouth 2 (two) times daily as needed.   latanoprost 0.005 % ophthalmic solution Commonly known as: XALATAN INSTILL 1 DROP IN EACH EYE AT BEDTIME.   lithium carbonate 150 MG capsule TAKE 1 CAPSULE TWICE DAILY WITH MEALS.   loratadine 10 MG tablet Commonly known as: Claritin Take 1 tablet (10 mg total) by mouth daily.   losartan 50 MG tablet Commonly known as: COZAAR TAKE ONE TABLET BY MOUTH ONCE DAILY   magic mouthwash w/lidocaine Soln Take 5 mLs by mouth 3 (three) times daily as needed for mouth pain.   MAGNESIUM PO Take by mouth.   multivitamin capsule Take 1 capsule by mouth daily.   omeprazole 20 MG capsule Commonly known as: PRILOSEC TAKE (1) CAPSULE TWICE DAILY.   timolol 0.25 % ophthalmic solution Commonly known as: BETIMOL 1-2 drops 2 (two) times daily.   topiramate 25 MG capsule Commonly known as: TOPAMAX Take 25 mg by mouth 2 (two) times daily.   triamcinolone cream 0.5 % Commonly known as: KENALOG Apply 1 Application topically 2 (two) times daily.        Review of Systems:  Review of Systems  Constitutional:  Positive for unexpected weight  change. Negative for appetite change, fatigue and fever.       Weight loss about #7-8Ibs since on the HA diet.   HENT:  Positive  for hearing loss and tinnitus. Negative for rhinorrhea and trouble swallowing.   Eyes:  Negative for visual disturbance.       Glaucoma, diplopia R+L lateral peripheral visual fields only.   Respiratory:  Negative for shortness of breath.        Occasionally DOE, hacking cough  Cardiovascular:  Positive for leg swelling.  Gastrointestinal:  Negative for abdominal pain and constipation.  Genitourinary:  Positive for frequency. Negative for dysuria and urgency.       Couple of times at night, no difficulty of returning asleep.   Musculoskeletal:  Positive for arthralgias and gait problem.       Walker.   Skin:  Positive for wound.  Neurological:  Positive for headaches. Negative for tremors, speech difficulty and light-headedness.       Tremors in hands/fingers at rest. L>R, near resolved,  comes and goes nature of the neck/occipital pain. Stiff neck. Pain the patent right knee, s/p TKR. Tremor is better since Sinemet.   Psychiatric/Behavioral:  Positive for sleep disturbance. Negative for confusion. The patient is nervous/anxious.        Better mood, feels not as anxious or depressive mood as prior.     Health Maintenance  Topic Date Due   COVID-19 Vaccine (4 - Moderna risk series) 08/08/2020   INFLUENZA VACCINE  08/14/2021   TETANUS/TDAP  08/19/2021   Pneumonia Vaccine 62+ Years old  Completed   DEXA SCAN  Completed   Zoster Vaccines- Shingrix  Completed   HPV VACCINES  Aged Out    Physical Exam: Vitals:   10/18/21 1626  BP: 104/60  Pulse: (!) 52  Resp: 18  Temp: (!) 97.3 F (36.3 C)  SpO2: 99%  Weight: 126 lb (57.2 kg)  Height: $Remove'5\' 3"'mAgQSfq$  (1.6 m)   Body mass index is 22.32 kg/m. Physical Exam Vitals and nursing note reviewed.  Constitutional:      Appearance: Normal appearance.  HENT:     Head: Normocephalic and atraumatic.     Nose: Nose  normal.     Mouth/Throat:     Mouth: Mucous membranes are moist.  Eyes:     Extraocular Movements: Extraocular movements intact.     Conjunctiva/sclera: Conjunctivae normal.     Pupils: Pupils are equal, round, and reactive to light.  Cardiovascular:     Rate and Rhythm: Regular rhythm. Bradycardia present.     Heart sounds: Murmur heard.     Comments: DP pulses present R+L. HR in 50s Pulmonary:     Effort: Pulmonary effort is normal.     Breath sounds: No rales.  Abdominal:     General: Bowel sounds are normal.     Palpations: Abdomen is soft.     Tenderness: There is no abdominal tenderness.  Musculoskeletal:        General: Tenderness present.     Cervical back: Normal range of motion and neck supple.     Right lower leg: Edema present.     Left lower leg: Edema present.     Comments: Chronic R knee pain is improved. S/p TKR right. Stiff neck, comes and goes neck/occipital pain. Trace edema BLE  Skin:    General: Skin is warm and dry.     Findings: Erythema present.     Comments: Improved the medial lower left leg a palm sized thickened, slightly reddened area, mild discomfort when palpated. DP present left foot. Scattered varicose veins left leg. Scalp laceration was closed with 2 staples, no active bleeding or  s/s of infection.    Neurological:     General: No focal deficit present.     Mental Status: She is alert and oriented to person, place, and time. Mental status is at baseline.     Motor: No weakness.     Coordination: Coordination abnormal.     Gait: Gait abnormal.     Comments: Resting tremor in fingers, near resolution.   Psychiatric:        Mood and Affect: Mood normal.        Behavior: Behavior normal.        Thought Content: Thought content normal.     Labs reviewed: Basic Metabolic Panel: Recent Labs    04/09/21 1716  NA 141  K 4.7  CL 109  CO2 24  GLUCOSE 93  BUN 21  CREATININE 0.99*  CALCIUM 10.7*  TSH 2.08   Liver Function Tests: Recent  Labs    04/09/21 1716  AST 20  ALT 11  BILITOT 0.6  PROT 6.6   No results for input(s): "LIPASE", "AMYLASE" in the last 8760 hours. No results for input(s): "AMMONIA" in the last 8760 hours. CBC: Recent Labs    04/09/21 1716  WBC 6.5  NEUTROABS 4,485  HGB 12.8  HCT 38.6  MCV 97.7  PLT 306   Lipid Panel: Recent Labs    04/09/21 1716  CHOL 271*  HDL 67  LDLCALC 166*  TRIG 213*  CHOLHDL 4.0   No results found for: "HGBA1C"  Procedures since last visit: CT Head Wo Contrast  Result Date: 10/16/2021 CLINICAL DATA:  Blunt trauma EXAM: CT HEAD WITHOUT CONTRAST TECHNIQUE: Contiguous axial images were obtained from the base of the skull through the vertex without intravenous contrast. RADIATION DOSE REDUCTION: This exam was performed according to the departmental dose-optimization program which includes automated exposure control, adjustment of the mA and/or kV according to patient size and/or use of iterative reconstruction technique. COMPARISON:  None Available. FINDINGS: Brain: No acute intracranial hemorrhage. No focal mass lesion. No CT evidence of acute infarction. No midline shift or mass effect. No hydrocephalus. Basilar cisterns are patent. There are periventricular and subcortical white matter hypodensities. Generalized cortical atrophy. Vascular: No hyperdense vessel or unexpected calcification. Skull: Normal. Negative for fracture or focal lesion. Sinuses/Orbits: Paranasal sinuses and mastoid air cells are clear. Orbits are clear. Other: None. IMPRESSION: 1. No acute intracranial findings. 2. Mild atrophy and moderate white matter microvascular disease Electronically Signed   By: Suzy Bouchard M.D.   On: 10/16/2021 16:17   CT Cervical Spine Wo Contrast  Result Date: 10/16/2021 CLINICAL DATA:  Trauma, fall. EXAM: CT CERVICAL SPINE WITHOUT CONTRAST TECHNIQUE: Multidetector CT imaging of the cervical spine was performed without intravenous contrast. Multiplanar CT image  reconstructions were also generated. RADIATION DOSE REDUCTION: This exam was performed according to the departmental dose-optimization program which includes automated exposure control, adjustment of the mA and/or kV according to patient size and/or use of iterative reconstruction technique. COMPARISON:  None Available. FINDINGS: Alignment: Normal. Skull base and vertebrae: No acute fracture. No primary bone lesion or focal pathologic process. The bones are osteopenic. Soft tissues and spinal canal: No prevertebral fluid or swelling. No visible canal hematoma. Disc levels: Preserved. There is minimal ossification of the posterior longitudinal ligament. Degenerative changes are seen in the left at C1-C2. No significant central canal or neural foraminal stenosis at any level. Upper chest: Negative. Other: There is a 1.2 cm hypodense left thyroid nodule. There is an air-fluid level in  the left maxillary sinus. IMPRESSION: 1. No acute fracture or traumatic subluxation of the cervical spine. 2. Air-fluid level in the left maxillary sinus. Correlate clinically for acute sinusitis. 3. 1.2 cm incidental left thyroid nodule. No follow-up imaging is recommended. Reference: J Am Coll Radiol. 2015 Feb;12(2): 143-50 Electronically Signed   By: Ronney Asters M.D.   On: 10/16/2021 16:02    Assessment/Plan  Scalp laceration Sustained from a mechanical fall at home,  s/p fall ED eval 10/16/21, stapled x2, CT head/cervical spine, EKG unremarkable. No active bleeding or s/s of infection.   Edema LLE negative DVT venous US, L>R. Medial thickened swelling area about her palm sized, mild warmth, redness, and indurated area for a few month. Varicose veins LLE not new.   Tinnitus of both ears underwent eval of  ENT, suggested music background.   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 prn MiraLax. Daily Psyllium .  Slow  transit constipation Stable, Prn Metamucil, MiraLax qd are effective.   Cervical dystonia Stable(diplopia R+L lateral,  tremor in fingers much improved), on Sinemet f/u Neurology. Cervical dystonia, no better, s/p  inj Botox inj, f/u neurology, pain clinic. No longer taking Zongran and stopped going to [facility]. No Parkinson's disease, took her off Sinemet per Neurology.  Cervical degenerative changes/dystonia, CT/MRI 12/2019 ruled out acute process, aches in neck R>L travels to the back of head, comes and goes, not disabling, f/u Neurosurgery. Failed Cymbalta, Gabapentin, Robaxin, Ultracet. s/p Botulinum Toxin inj by Neurology. ESR 9, CRP 2.7 10/09/20. The patient stated Alprazolam and Tylenol are kind of effective. Improved on Topamax, but associated with weight loss.   GERD (gastroesophageal reflux disease)  stable, on Omeprazole, Hgb 12.8 04/09/21  Essential (primary) hypertension blood pressure is controlled on Losartan  Bipolar I disorder, single manic episode, in full remission ([place]) stable, on Lithium, GDR due to bradycardia, takes Alprazolam too, TSH 2.08 04/09/21  Bradycardia  improved from 40s to 50ss. Hx of normalized HR after Tylenol PM dc'd, GDR of Lithium helped too.   Hypercalcemia f/u endocrinology, Ca 10.7 04/09/21,  PTH 79 01/12/20. GDR of Lithium may help  Arthritis, degenerative takes  Tylenol, s/p R knee arthroplasty.   Cognitive impairment 12/2019 MRI brain showed chronic microvascular ischemic changes, no acute disease with mild cortical atrophy  HLD (hyperlipidemia) LDL 166 04/09/21, on diet, allergic to statin.    Labs/tests ordered:  none   Next appt:  one week.

## 2021-10-18 NOTE — Assessment & Plan Note (Signed)
Stable, Prn Metamucil, MiraLax qd are effective.

## 2021-10-18 NOTE — Assessment & Plan Note (Signed)
Hx of GIST of small intestine s/p resection. prn MiraLax. Daily Psyllium .

## 2021-10-18 NOTE — Assessment & Plan Note (Signed)
12/2019 MRI brain showed chronic microvascular ischemic changes, no acute disease with mild cortical atrophy

## 2021-10-18 NOTE — Assessment & Plan Note (Signed)
stable, on Omeprazole, Hgb 12.8 04/09/21 

## 2021-10-18 NOTE — Assessment & Plan Note (Addendum)
Stable(diplopia R+L lateral,  tremor in fingers much improved), on Sinemet f/u Neurology. Cervical dystonia, no better, s/p  inj Botox inj, f/u neurology, pain clinic. No longer taking Zongran and stopped going to [facility]. No Parkinson's disease, took her off Sinemet per Neurology.  Cervical degenerative changes/dystonia, CT/MRI 12/2019 ruled out acute process, aches in neck R>L travels to the back of head, comes and goes, not disabling, f/u Neurosurgery. Failed Cymbalta, Gabapentin, Robaxin, Ultracet. s/p Botulinum Toxin inj by Neurology. ESR 9, CRP 2.7 10/09/20. The patient stated Alprazolam and Tylenol are kind of effective. Improved on Topamax, but associated with weight loss.

## 2021-10-18 NOTE — Assessment & Plan Note (Signed)
underwent eval of  ENT, suggested music background.

## 2021-10-19 ENCOUNTER — Encounter: Payer: Self-pay | Admitting: Nurse Practitioner

## 2021-10-19 NOTE — Progress Notes (Signed)
This encounter was created in error - please disregard.

## 2021-10-24 ENCOUNTER — Encounter: Payer: Self-pay | Admitting: Nurse Practitioner

## 2021-10-25 ENCOUNTER — Encounter: Payer: Self-pay | Admitting: Nurse Practitioner

## 2021-10-25 ENCOUNTER — Non-Acute Institutional Stay: Payer: Medicare PPO | Admitting: Nurse Practitioner

## 2021-10-25 DIAGNOSIS — E785 Hyperlipidemia, unspecified: Secondary | ICD-10-CM

## 2021-10-25 DIAGNOSIS — M159 Polyosteoarthritis, unspecified: Secondary | ICD-10-CM

## 2021-10-25 DIAGNOSIS — G243 Spasmodic torticollis: Secondary | ICD-10-CM

## 2021-10-25 DIAGNOSIS — S0101XD Laceration without foreign body of scalp, subsequent encounter: Secondary | ICD-10-CM | POA: Diagnosis not present

## 2021-10-25 DIAGNOSIS — I10 Essential (primary) hypertension: Secondary | ICD-10-CM

## 2021-10-25 DIAGNOSIS — H9313 Tinnitus, bilateral: Secondary | ICD-10-CM | POA: Diagnosis not present

## 2021-10-25 DIAGNOSIS — R4189 Other symptoms and signs involving cognitive functions and awareness: Secondary | ICD-10-CM

## 2021-10-25 DIAGNOSIS — C49A3 Gastrointestinal stromal tumor of small intestine: Secondary | ICD-10-CM | POA: Diagnosis not present

## 2021-10-25 DIAGNOSIS — G20A1 Parkinson's disease without dyskinesia, without mention of fluctuations: Secondary | ICD-10-CM

## 2021-10-25 DIAGNOSIS — R609 Edema, unspecified: Secondary | ICD-10-CM

## 2021-10-25 DIAGNOSIS — K5901 Slow transit constipation: Secondary | ICD-10-CM

## 2021-10-25 DIAGNOSIS — F304 Manic episode in full remission: Secondary | ICD-10-CM

## 2021-10-25 DIAGNOSIS — R001 Bradycardia, unspecified: Secondary | ICD-10-CM

## 2021-10-25 DIAGNOSIS — K219 Gastro-esophageal reflux disease without esophagitis: Secondary | ICD-10-CM

## 2021-10-25 NOTE — Assessment & Plan Note (Signed)
LLE negative DVT venous US, L>R. Medial thickened swelling area about her palm sized, mild warmth, redness, and indurated area for a few month. Varicose veins LLE not new.  

## 2021-10-25 NOTE — Assessment & Plan Note (Signed)
12/2019 MRI brain showed chronic microvascular ischemic changes, no acute disease with mild cortical atrophy

## 2021-10-25 NOTE — Assessment & Plan Note (Signed)
LDL 166 04/09/21, on diet, allergic to statin.  

## 2021-10-25 NOTE — Assessment & Plan Note (Signed)
underwent eval of  ENT, suggested music background.

## 2021-10-25 NOTE — Assessment & Plan Note (Signed)
stable, on Lithium, GDR due to bradycardia, takes Alprazolam too, TSH 2.08 04/09/21

## 2021-10-25 NOTE — Assessment & Plan Note (Signed)
improved from 40s to 50ss. Hx of normalized HR after Tylenol PM dc'd, GDR of Lithium helped too.

## 2021-10-25 NOTE — Assessment & Plan Note (Signed)
f/u endocrinology, Ca 10.7 04/09/21,  PTH 79 01/12/20. GDR of Lithium may help 

## 2021-10-25 NOTE — Assessment & Plan Note (Signed)
stable, on Omeprazole, Hgb 12.8 04/09/21 

## 2021-10-25 NOTE — Assessment & Plan Note (Signed)
Prn Metamucil, MiraLax qd are effective.

## 2021-10-25 NOTE — Assessment & Plan Note (Signed)
Cervical degenerative changes/dystonia, better, CT/MRI 12/2019 ruled out acute process, aches in neck R>L travels to the back of head, comes and goes, not disabling, f/u Neurosurgery. Failed Cymbalta, Gabapentin, Robaxin, Ultracet. s/p Botulinum Toxin inj by Neurology. ESR 9, CRP 2.7 10/09/20. The patient stated Alprazolam and Tylenol are kind of effective. Improved on Topamax, but associated with weight loss.  No Parkinson's disease, took her off Sinemet per Neurology.

## 2021-10-25 NOTE — Progress Notes (Signed)
Location:   Cl[facility]   Place of Service:  Clinic (12) Provider: Chipper Oman NP  Code Status: DNR Goals of Care: [place]    10/16/2021    1:22 PM  Advanced Directives  Does Patient Have a Medical Advance Directive? No     Chief Complaint  Patient presents with   Acute Visit    Patient is here to get stiches removed    HPI: Patient is a 81 y.o. female seen today for medical management of chronic diseases.     S/p fall ED eval 10/16/21, stapled x2-healed, CT head/cervical spine, EKG unremarkable.    Edema, LLE negative DVT venous US, L>R. Medial thickened swelling area about her palm sized, mild warmth, redness, and indurated area for a few month. Varicose veins LLE not new.  Tinnitus, underwent eval of  ENT, suggested music background.              Hx of GIST of small intestine s/p resection. prn MiraLax. Daily Psyllium .             Constipation. Prn Metamucil, MiraLax qd are effective.              GERD, stable, on Omeprazole, Hgb 12.8 04/09/21             HTN, blood pressure is controlled on Lartan             Bipolar disorder, stable, on Lithium, GDR due to bradycardia, takes Alprazolam too, TSH 2.08 04/09/21             Bradycardia, improved from 40s to 50ss. Hx of normalized HR after Tylenol PM dc'd, GDR of Lithium helped too.              Hypercalcemia, f/u endocrinology, Ca 10.7 04/09/21,  PTH 79 01/12/20. GDR of Lithium may help             OA, takes  Tylenol, s/p R knee arthroplasty.             Cervical degenerative changes/dystonia, better, CT/MRI 12/2019 ruled out acute process, aches in neck R>L travels to the back of head, comes and goes, not disabling, f/u Neurosurgery. Failed Cymbalta, Gabapentin, Robaxin, Ultracet. s/p Botulinum Toxin inj by Neurology. ESR 9, CRP 2.7 10/09/20. The patient stated Alprazolam and Tylenol are kind of effective. Improved on Topamax, but associated with weight loss.  No Parkinson's disease, took her off Sinemet per Neurology.               Cognitive impairment: 12/2019 MRI brain showed chronic microvascular ischemic changes, no acute disease with mild cortical atrophy             Hyperlipidemia, LDL 166 04/09/21, on diet, allergic to statin.  Past Medical History:  Diagnosis Date   Anxiety    Arthritis    Cardiac conduction disorder 03/30/2012   Overview:  STORY: ETT 03/09/2012 Echo 03/07/2012 normal Dr Jacinto Halim, bradycardia felt due to glaucoma eye drops   Cervical dystonia    Diverticulosis of colon    GERD (gastroesophageal reflux disease)    GIST (gastrointestinal stroma tumor), malignant, colon (HCC)    Heart murmur    History of colon polyps 10/24/2008   Hypertension    Major neurocognitive disorder due to Parkinson's disease, possible    Tremors possible parkinsons   Manic disorder, single episode, in full remission (HCC) 12/01/2009   Sixth nerve palsy     Past Surgical History:  Procedure Laterality Date  BILATERAL SALPINGOOPHORECTOMY  09/22/2007   CATARACT EXTRACTION Bilateral    Gastrointestinal Stroma Tumor,  Other  1960   GIST Surgery   ILEOCECETOMY  09/22/2007   OVARIAN CYST REMOVAL Right 1967   SMALL INTESTINE SURGERY  09/22/2007   TOTAL KNEE ARTHROPLASTY Right 10/05/2019   Procedure: RIGHT TOTAL KNEE ARTHROPLASTY;  Surgeon: Meredith Pel, MD;  Location: [facility];  Service: Orthopedics;  Laterality: Right;   TOTAL VAGINAL HYSTERECTOMY  1986   Fibroids    Allergies  Allergen Reactions   Lisinopril Cough   Penicillins Itching    50 years ago   Adhesive [Tape] Itching   Atorvastatin Itching   Dilaudid [Hydromorphone Hcl] Itching   Hydromorphone Itching    Allergies as of 10/25/2021       Reactions   Lisinopril Cough   Penicillins Itching   50 years ago   Adhesive [tape] Itching   Atorvastatin Itching   Dilaudid [hydromorphone Hcl] Itching   Hydromorphone Itching        Medication List        Accurate as of October 25, 2021 11:59 PM. If you have any questions, ask your nurse or  doctor.          ALPRAZolam 0.5 MG tablet Commonly known as: XANAX TAKE ONE TABLET BY MOUTH TWICE DAILY AS NEEDED FOR ANXIETY   artificial tears ointment as needed.   aspirin 81 MG chewable tablet Chew by mouth daily.   Botox 100 units Solr injection Generic drug: botulinum toxin Type A Inject 300 units into the head and neck every 90 days by Dr. Wells Guiles Tat   clindamycin 150 MG capsule Commonly known as: CLEOCIN Take $RemoveBefo'600mg'hSBRfHLGOfE$  1 hour prior to dental procedure   dorzolamide 2 % ophthalmic solution Commonly known as: TRUSOPT 1 drop 3 (three) times daily.   fexofenadine 60 MG tablet Commonly known as: ALLEGRA Take 60 mg by mouth 2 (two) times daily as needed.   latanoprost 0.005 % ophthalmic solution Commonly known as: XALATAN INSTILL 1 DROP IN EACH EYE AT BEDTIME.   lithium carbonate 150 MG capsule TAKE 1 CAPSULE TWICE DAILY WITH MEALS.   loratadine 10 MG tablet Commonly known as: Claritin Take 1 tablet (10 mg total) by mouth daily.   losartan 50 MG tablet Commonly known as: COZAAR TAKE ONE TABLET BY MOUTH ONCE DAILY   magic mouthwash w/lidocaine Soln Take 5 mLs by mouth 3 (three) times daily as needed for mouth pain.   MAGNESIUM PO Take by mouth.   multivitamin capsule Take 1 capsule by mouth daily.   omeprazole 20 MG capsule Commonly known as: PRILOSEC TAKE (1) CAPSULE TWICE DAILY.   timolol 0.25 % ophthalmic solution Commonly known as: BETIMOL 1-2 drops 2 (two) times daily.   topiramate 25 MG capsule Commonly known as: TOPAMAX Take 25 mg by mouth 2 (two) times daily.   triamcinolone cream 0.5 % Commonly known as: KENALOG Apply 1 Application topically 2 (two) times daily.        Review of Systems:  Review of Systems  Constitutional:  Negative for appetite change, fatigue and fever.  HENT:  Positive for hearing loss and tinnitus. Negative for rhinorrhea and trouble swallowing.   Eyes:  Negative for visual disturbance.       Glaucoma,  diplopia R+L lateral peripheral visual fields only.   Respiratory:  Negative for shortness of breath.        Occasionally DOE, hacking cough  Cardiovascular:  Positive for leg swelling.  Gastrointestinal:  Negative for abdominal pain and constipation.  Genitourinary:  Positive for frequency. Negative for dysuria and urgency.       Couple of times at night, no difficulty of returning asleep.   Musculoskeletal:  Positive for arthralgias and gait problem.       Walker.   Skin:  Negative for color change.  Neurological:  Negative for tremors, speech difficulty, light-headedness and headaches.       Comes and goes nature of the neck/occipital pain. Stiff neck. Pain the patent right knee, s/p TKR. Tremor is better since Sinemet.   Psychiatric/Behavioral:  Positive for sleep disturbance. Negative for confusion. The patient is nervous/anxious.        Better mood, feels not as anxious or depressive mood as prior.     Health Maintenance  Topic Date Due   COVID-19 Vaccine (4 - Moderna risk series) 08/08/2020   INFLUENZA VACCINE  08/14/2021   TETANUS/TDAP  08/19/2021   Pneumonia Vaccine 21+ Years old  Completed   DEXA SCAN  Completed   Zoster Vaccines- Shingrix  Completed   HPV VACCINES  Aged Out    Physical Exam: Vitals:   10/25/21 1301  BP: 128/64  Pulse: (!) 50  Resp: 18  Temp: (!) 97.3 F (36.3 C)  SpO2: 99%  Weight: 127 lb 9.6 oz (57.9 kg)  Height: $Remove'5\' 3"'QCEMHvZ$  (1.6 m)   Body mass index is 22.6 kg/m. Physical Exam Vitals and nursing note reviewed.  Constitutional:      Appearance: Normal appearance.  HENT:     Head: Normocephalic and atraumatic.     Nose: Nose normal.     Mouth/Throat:     Mouth: Mucous membranes are moist.  Eyes:     Extraocular Movements: Extraocular movements intact.     Conjunctiva/sclera: Conjunctivae normal.     Pupils: Pupils are equal, round, and reactive to light.  Cardiovascular:     Rate and Rhythm: Regular rhythm. Bradycardia present.     Heart  sounds: Murmur heard.     Comments: DP pulses present R+L. HR in 50s Pulmonary:     Effort: Pulmonary effort is normal.     Breath sounds: No rales.  Abdominal:     General: Bowel sounds are normal.     Palpations: Abdomen is soft.     Tenderness: There is no abdominal tenderness.  Musculoskeletal:     Cervical back: Normal range of motion and neck supple.     Right lower leg: Edema present.     Left lower leg: Edema present.     Comments: Chronic R knee pain is improved. S/p TKR right. Stiff neck, comes and goes neck/occipital pain. Trace edema BLE  Skin:    General: Skin is warm and dry.     Findings: Erythema present.     Comments: Improved the medial lower left leg a palm sized thickened, slightly reddened area, mild discomfort when palpated. DP present left foot. Scattered varicose veins left leg. Scalp laceration-healed-staples removed.    Neurological:     General: No focal deficit present.     Mental Status: She is alert and oriented to person, place, and time. Mental status is at baseline.     Motor: No weakness.     Coordination: Coordination abnormal.     Gait: Gait abnormal.     Comments: Resting tremor in fingers, near resolution.   Psychiatric:        Mood and Affect: Mood normal.        Behavior:  Behavior normal.        Thought Content: Thought content normal.     Labs reviewed: Basic Metabolic Panel: Recent Labs    04/09/21 1716  NA 141  K 4.7  CL 109  CO2 24  GLUCOSE 93  BUN 21  CREATININE 0.99*  CALCIUM 10.7*  TSH 2.08   Liver Function Tests: Recent Labs    04/09/21 1716  AST 20  ALT 11  BILITOT 0.6  PROT 6.6   No results for input(s): "LIPASE", "AMYLASE" in the last 8760 hours. No results for input(s): "AMMONIA" in the last 8760 hours. CBC: Recent Labs    04/09/21 1716  WBC 6.5  NEUTROABS 4,485  HGB 12.8  HCT 38.6  MCV 97.7  PLT 306   Lipid Panel: Recent Labs    04/09/21 1716  CHOL 271*  HDL 67  LDLCALC 166*  TRIG 213*   CHOLHDL 4.0   No results found for: "HGBA1C"  Procedures since last visit: CT Head Wo Contrast  Result Date: 10/16/2021 CLINICAL DATA:  Blunt trauma EXAM: CT HEAD WITHOUT CONTRAST TECHNIQUE: Contiguous axial images were obtained from the base of the skull through the vertex without intravenous contrast. RADIATION DOSE REDUCTION: This exam was performed according to the departmental dose-optimization program which includes automated exposure control, adjustment of the mA and/or kV according to patient size and/or use of iterative reconstruction technique. COMPARISON:  None Available. FINDINGS: Brain: No acute intracranial hemorrhage. No focal mass lesion. No CT evidence of acute infarction. No midline shift or mass effect. No hydrocephalus. Basilar cisterns are patent. There are periventricular and subcortical white matter hypodensities. Generalized cortical atrophy. Vascular: No hyperdense vessel or unexpected calcification. Skull: Normal. Negative for fracture or focal lesion. Sinuses/Orbits: Paranasal sinuses and mastoid air cells are clear. Orbits are clear. Other: None. IMPRESSION: 1. No acute intracranial findings. 2. Mild atrophy and moderate white matter microvascular disease Electronically Signed   By: Suzy Bouchard M.D.   On: 10/16/2021 16:17   CT Cervical Spine Wo Contrast  Result Date: 10/16/2021 CLINICAL DATA:  Trauma, fall. EXAM: CT CERVICAL SPINE WITHOUT CONTRAST TECHNIQUE: Multidetector CT imaging of the cervical spine was performed without intravenous contrast. Multiplanar CT image reconstructions were also generated. RADIATION DOSE REDUCTION: This exam was performed according to the departmental dose-optimization program which includes automated exposure control, adjustment of the mA and/or kV according to patient size and/or use of iterative reconstruction technique. COMPARISON:  None Available. FINDINGS: Alignment: Normal. Skull base and vertebrae: No acute fracture. No primary  bone lesion or focal pathologic process. The bones are osteopenic. Soft tissues and spinal canal: No prevertebral fluid or swelling. No visible canal hematoma. Disc levels: Preserved. There is minimal ossification of the posterior longitudinal ligament. Degenerative changes are seen in the left at C1-C2. No significant central canal or neural foraminal stenosis at any level. Upper chest: Negative. Other: There is a 1.2 cm hypodense left thyroid nodule. There is an air-fluid level in the left maxillary sinus. IMPRESSION: 1. No acute fracture or traumatic subluxation of the cervical spine. 2. Air-fluid level in the left maxillary sinus. Correlate clinically for acute sinusitis. 3. 1.2 cm incidental left thyroid nodule. No follow-up imaging is recommended. Reference: J Am Coll Radiol. 2015 Feb;12(2): 143-50 Electronically Signed   By: Ronney Asters M.D.   On: 10/16/2021 16:02    Assessment/Plan  Scalp laceration S/p fall ED eval 10/16/21, stapled x2-healed-removed.  CT head/cervical spine, EKG unremarkable.  Edema  LLE negative DVT venous US, L>R.  Medial thickened swelling area about her palm sized, mild warmth, redness, and indurated area for a few month. Varicose veins LLE not new.   Tinnitus of both ears underwent eval of  ENT, suggested music background.   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 prn MiraLax. Daily Psyllium .  Slow transit constipation Prn Metamucil, MiraLax qd are effective.   GERD (gastroesophageal reflux disease) stable, on Omeprazole, Hgb 12.8 04/09/21  Essential (primary) hypertension blood pressure is controlled on Losartan  Bipolar I disorder, single manic episode, in full remission ([place])  stable, on Lithium, GDR due to bradycardia, takes Alprazolam too, TSH 2.08 04/09/21  Bradycardia  improved from 40s to 50ss. Hx of normalized HR after Tylenol PM dc'd, GDR of  Lithium helped too.   Hypercalcemia  f/u endocrinology, Ca 10.7 04/09/21,  PTH 79 01/12/20. GDR of Lithium may help  Arthritis, degenerative takes  Tylenol, s/p R knee arthroplasty.   Cervical dystonia Cervical degenerative changes/dystonia, better, CT/MRI 12/2019 ruled out acute process, aches in neck R>L travels to the back of head, comes and goes, not disabling, f/u Neurosurgery. Failed Cymbalta, Gabapentin, Robaxin, Ultracet. s/p Botulinum Toxin inj by Neurology. ESR 9, CRP 2.7 10/09/20. The patient stated Alprazolam and Tylenol are kind of effective. Improved on Topamax, but associated with weight loss.  No Parkinson's disease, took her off Sinemet per Neurology.   Cognitive impairment 12/2019 MRI brain showed chronic microvascular ischemic changes, no acute disease with mild cortical atrophy  HLD (hyperlipidemia)  LDL 166 04/09/21, on diet, allergic to statin.    Labs/tests ordered:  none  Next appt:  3 months.

## 2021-10-25 NOTE — Assessment & Plan Note (Signed)
Hx of GIST of small intestine s/p resection. prn MiraLax. Daily Psyllium .

## 2021-10-25 NOTE — Assessment & Plan Note (Addendum)
S/p fall ED eval 10/16/21, stapled x2-healed-removed.  CT head/cervical spine, EKG unremarkable.

## 2021-10-25 NOTE — Assessment & Plan Note (Signed)
takes  Tylenol, s/p R knee arthroplasty.  

## 2021-10-25 NOTE — Assessment & Plan Note (Deleted)
Stable(diplopia R+L lateral,  tremor in fingers much improved), on Sinemet f/u Neurology.

## 2021-10-25 NOTE — Assessment & Plan Note (Signed)
blood pressure is controlled on Losartan 

## 2021-10-26 ENCOUNTER — Encounter: Payer: Self-pay | Admitting: Nurse Practitioner

## 2021-10-29 ENCOUNTER — Other Ambulatory Visit: Payer: Self-pay | Admitting: Nurse Practitioner

## 2021-11-02 DIAGNOSIS — R29898 Other symptoms and signs involving the musculoskeletal system: Secondary | ICD-10-CM | POA: Diagnosis not present

## 2021-11-05 ENCOUNTER — Other Ambulatory Visit: Payer: Self-pay | Admitting: Nurse Practitioner

## 2021-11-05 DIAGNOSIS — F304 Manic episode in full remission: Secondary | ICD-10-CM

## 2021-11-05 NOTE — Telephone Encounter (Signed)
Patient has request refill on medication Xanax. Patient last refill dated 09/11/2021. Patient has No Contract on file. Medication pend and sent to PCP Mast, Man X, NP for approval.

## 2021-11-09 DIAGNOSIS — R29898 Other symptoms and signs involving the musculoskeletal system: Secondary | ICD-10-CM | POA: Diagnosis not present

## 2021-11-13 DIAGNOSIS — M2011 Hallux valgus (acquired), right foot: Secondary | ICD-10-CM | POA: Diagnosis not present

## 2021-11-13 DIAGNOSIS — M2012 Hallux valgus (acquired), left foot: Secondary | ICD-10-CM | POA: Diagnosis not present

## 2021-11-13 DIAGNOSIS — L602 Onychogryphosis: Secondary | ICD-10-CM | POA: Diagnosis not present

## 2021-11-13 DIAGNOSIS — M2042 Other hammer toe(s) (acquired), left foot: Secondary | ICD-10-CM | POA: Diagnosis not present

## 2021-11-13 DIAGNOSIS — R29898 Other symptoms and signs involving the musculoskeletal system: Secondary | ICD-10-CM | POA: Diagnosis not present

## 2021-11-13 DIAGNOSIS — M2041 Other hammer toe(s) (acquired), right foot: Secondary | ICD-10-CM | POA: Diagnosis not present

## 2021-11-13 DIAGNOSIS — L84 Corns and callosities: Secondary | ICD-10-CM | POA: Diagnosis not present

## 2021-11-21 ENCOUNTER — Non-Acute Institutional Stay (INDEPENDENT_AMBULATORY_CARE_PROVIDER_SITE_OTHER): Payer: Medicare PPO | Admitting: Family Medicine

## 2021-11-21 ENCOUNTER — Encounter: Payer: Self-pay | Admitting: Family Medicine

## 2021-11-21 VITALS — BP 130/80 | HR 51 | Temp 97.8°F | Ht 63.0 in | Wt 128.8 lb

## 2021-11-21 DIAGNOSIS — F411 Generalized anxiety disorder: Secondary | ICD-10-CM | POA: Diagnosis not present

## 2021-11-21 DIAGNOSIS — K22719 Barrett's esophagus with dysplasia, unspecified: Secondary | ICD-10-CM | POA: Diagnosis not present

## 2021-11-21 DIAGNOSIS — R29898 Other symptoms and signs involving the musculoskeletal system: Secondary | ICD-10-CM | POA: Diagnosis not present

## 2021-11-21 DIAGNOSIS — F304 Manic episode in full remission: Secondary | ICD-10-CM

## 2021-11-21 NOTE — Patient Instructions (Addendum)
Stop Claritin  Stop the Prilosec and start Nexium twice over the counter Increase lithium to 150 to threes daily

## 2021-11-21 NOTE — Progress Notes (Unsigned)
Provider:  Alain Honey, MD  Careteam: Patient Care Team: Mast, Man X, NP as PCP - General (Internal Medicine) Marti Sleigh, MD as Consulting Physician (Gynecologic Oncology) Ladell Pier, MD as Consulting Physician (Oncology) Richmond Campbell, MD as Consulting Physician (Gastroenterology) Clent Jacks, MD as Consulting Physician (Ophthalmology) Sabra Heck Precious Haws, MD as Referring Physician (Neurology) Tat, Eustace Quail, DO as Consulting Physician (Neurology)  PLACE OF SERVICE:  [facility]  Advanced Directive information    Allergies  Allergen Reactions   Lisinopril Cough   Penicillins Itching    50 years ago   Adhesive [Tape] Itching   Atorvastatin Itching   Dilaudid [Hydromorphone Hcl] Itching   Hydromorphone Itching    Chief Complaint  Patient presents with   Acute Visit    Patient presents today for sore throat that occurred 2 months ago     HPI: Patient is a 81 y.o. female .  Patient continues to have sore throat.  She was given Magic mouthwash at last visit without any relief.  She does endorse some PND but she is also on 2 antihistamines which likely have a drying effect on her throat and any drainage which may have the effect of thickening the drainage.  She has had some issues recently with her nerves.  I believe she has bipolar disease.  She is on lithium.  We checked level at her last visit and found it to be slightly low at 150 mg twice daily.  She also takes alprazolam at least twice a day.  Review of Systems:  Review of Systems  Constitutional: Negative.   HENT:  Positive for sore throat.   Respiratory: Negative.    Cardiovascular: Negative.   Psychiatric/Behavioral:  Positive for depression. The patient is nervous/anxious.     Past Medical History:  Diagnosis Date   Anxiety    Arthritis    Cardiac conduction disorder 03/30/2012   Overview:  STORY: ETT 03/09/2012 Echo 03/07/2012 normal Dr Einar Gip, bradycardia felt due to glaucoma eye  drops   Cervical dystonia    Diverticulosis of colon    GERD (gastroesophageal reflux disease)    GIST (gastrointestinal stroma tumor), malignant, colon ([place])    Heart murmur    History of colon polyps 10/24/2008   Hypertension    Major neurocognitive disorder due to Parkinson's disease, possible    Tremors possible parkinsons   Manic disorder, single episode, in full remission ([place]) 12/01/2009   Sixth nerve palsy    Past Surgical History:  Procedure Laterality Date   BILATERAL SALPINGOOPHORECTOMY  09/22/2007   CATARACT EXTRACTION Bilateral    Gastrointestinal Stroma Tumor,  Other  1960   GIST Surgery   ILEOCECETOMY  09/22/2007   OVARIAN CYST REMOVAL Right 1967   SMALL INTESTINE SURGERY  09/22/2007   TOTAL KNEE ARTHROPLASTY Right 10/05/2019   Procedure: RIGHT TOTAL KNEE ARTHROPLASTY;  Surgeon: Meredith Pel, MD;  Location: [facility];  Service: Orthopedics;  Laterality: Right;   TOTAL VAGINAL HYSTERECTOMY  1986   Fibroids   Social History:   reports that she has never smoked. She has never used smokeless tobacco. She reports that she does not drink alcohol and does not use drugs.  Family History  Problem Relation Age of Onset   Congestive Heart Failure Mother    Dementia Mother    Heart disease Father    Diabetes Father    Lung cancer Son     Medications: Patient's Medications  New Prescriptions   No medications on file  Previous Medications   ALPRAZOLAM (XANAX) 0.5 MG TABLET    TAKE ONE TABLET BY MOUTH TWICE DAILY AS NEEDED FOR ANXIETY   ASPIRIN 81 MG CHEWABLE TABLET    Chew by mouth daily.   BOTULINUM TOXIN TYPE A (BOTOX) 100 UNITS SOLR INJECTION    Inject 300 units into the head and neck every 90 days by Dr. Wells Guiles Tat   CLINDAMYCIN (CLEOCIN) 150 MG CAPSULE    Take '600mg'$  1 hour prior to dental procedure   DORZOLAMIDE (TRUSOPT) 2 % OPHTHALMIC SOLUTION    1 drop 3 (three) times daily.   FEXOFENADINE (ALLEGRA) 60 MG TABLET    Take 60 mg by mouth 2 (two) times daily  as needed.   LATANOPROST (XALATAN) 0.005 % OPHTHALMIC SOLUTION    INSTILL 1 DROP IN [facility] EYE AT BEDTIME.   LITHIUM CARBONATE 150 MG CAPSULE    TAKE 1 CAPSULE TWICE DAILY WITH MEALS.   LORATADINE (CLARITIN) 10 MG TABLET    Take 1 tablet (10 mg total) by mouth daily.   LOSARTAN (COZAAR) 50 MG TABLET    TAKE ONE TABLET BY MOUTH ONCE DAILY   MAGIC MOUTHWASH W/LIDOCAINE SOLN    Take 5 mLs by mouth 3 (three) times daily as needed for mouth pain.   MAGNESIUM PO    Take by mouth.   MULTIPLE VITAMIN (MULTIVITAMIN) CAPSULE    Take 1 capsule by mouth daily.   OMEPRAZOLE (PRILOSEC) 20 MG CAPSULE    TAKE ONE CAPSULE TWICE DAILY   TIMOLOL (BETIMOL) 0.25 % OPHTHALMIC SOLUTION    1-2 drops 2 (two) times daily.   TOPIRAMATE (TOPAMAX) 25 MG CAPSULE    Take 25 mg by mouth 2 (two) times daily.   TRIAMCINOLONE CREAM (KENALOG) 0.5 %    Apply 1 Application topically 2 (two) times daily.   WHITE PETROLATUM-MINERAL OIL (ARTIFICIAL TEARS) OINTMENT    as needed.  Modified Medications   No medications on file  Discontinued Medications   No medications on file    Physical Exam:  Vitals:   11/21/21 1532  BP: 130/80  Pulse: (!) 51  Temp: 97.8 F (36.6 C)  SpO2: 98%  Weight: 128 lb 12.8 oz (58.4 kg)  Height: '5\' 3"'$  (1.6 m)   Body mass index is 22.82 kg/m. Wt Readings from Last 3 Encounters:  11/21/21 128 lb 12.8 oz (58.4 kg)  10/25/21 127 lb 9.6 oz (57.9 kg)  10/18/21 126 lb (57.2 kg)    Physical Exam Vitals and nursing note reviewed.  Constitutional:      Appearance: Normal appearance.  HENT:     Mouth/Throat:     Pharynx: Oropharynx is clear. No oropharyngeal exudate or posterior oropharyngeal erythema.  Cardiovascular:     Rate and Rhythm: Normal rate and regular rhythm.  Pulmonary:     Effort: Pulmonary effort is normal.     Breath sounds: Normal breath sounds.  Neurological:     General: No focal deficit present.     Mental Status: She is alert and oriented to person, place, and time.   Psychiatric:        Mood and Affect: Mood normal.        Behavior: Behavior normal.     Labs reviewed: Basic Metabolic Panel: Recent Labs    04/09/21 1716  NA 141  K 4.7  CL 109  CO2 24  GLUCOSE 93  BUN 21  CREATININE 0.99*  CALCIUM 10.7*  TSH 2.08   Liver Function Tests: Recent Labs  04/09/21 1716  AST 20  ALT 11  BILITOT 0.6  PROT 6.6   No results for input(s): "LIPASE", "AMYLASE" in the last 8760 hours. No results for input(s): "AMMONIA" in the last 8760 hours. CBC: Recent Labs    04/09/21 1716  WBC 6.5  NEUTROABS 4,485  HGB 12.8  HCT 38.6  MCV 97.7  PLT 306   Lipid Panel: Recent Labs    04/09/21 1716  CHOL 271*  HDL 67  LDLCALC 166*  TRIG 213*  CHOLHDL 4.0   TSH: Recent Labs    04/09/21 1716  TSH 2.08   A1C: No results found for: "HGBA1C"   Assessment/Plan  1. Anxiety state Continue with alprazolam but I would like to increase lithium in hopes of achieving a therapeutic level  2. Bipolar I disorder, single manic episode, in full remission ([place]) I have the sense that several of her complaints may be related.  Lithium as noted above and we will plan to check level on return.  I have  .dia Alain Honey, MD [facility] [phone]

## 2021-11-22 ENCOUNTER — Encounter: Payer: Medicare PPO | Admitting: Nurse Practitioner

## 2021-11-23 ENCOUNTER — Other Ambulatory Visit: Payer: Self-pay | Admitting: Nurse Practitioner

## 2021-11-23 DIAGNOSIS — R29898 Other symptoms and signs involving the musculoskeletal system: Secondary | ICD-10-CM | POA: Diagnosis not present

## 2021-11-23 DIAGNOSIS — F304 Manic episode in full remission: Secondary | ICD-10-CM

## 2021-11-23 NOTE — Telephone Encounter (Signed)
Patient medication has High Risk Warnings.  

## 2021-11-26 ENCOUNTER — Other Ambulatory Visit: Payer: Self-pay

## 2021-11-26 DIAGNOSIS — F304 Manic episode in full remission: Secondary | ICD-10-CM

## 2021-11-26 MED ORDER — LITHIUM CARBONATE 150 MG PO CAPS: 150.0000 mg | ORAL_CAPSULE | Freq: Three times a day (TID) | ORAL | 1 refills | Status: DC

## 2021-11-26 NOTE — Telephone Encounter (Signed)
High Importance (Red) Pharmacologic effects and plasma concentrations of lithium may be increased by angiotensin II receptor antagonists. Lithium toxicity may occur. Details Override reason       lithium carbonate 150 MG capsule [Pharmacy Med Name: lithium carbonate 150 mg capsule] Prescription. Reordered. Long-term. losartan (COZAAR) 50 MG tabletPatient reported medication. Active.   Please advise if lithium can be refilled or not.  I thought I was deleting the note I had just typed, not this one. It was deleted in error.

## 2021-11-26 NOTE — Telephone Encounter (Deleted)
High Importance (Red) Pharmacologic effects and plasma concentrations of lithium may be increased by angiotensin II receptor antagonists. Lithium toxicity may occur. Details Override reason     lithium carbonate 150 MG capsule [Pharmacy Med Name: lithium carbonate 150 mg capsule] Prescription. Reordered. Long-term. losartan (COZAAR) 50 MG tabletPatient reported medication. Active.  Please advise if lithium can be refilled or not.

## 2021-11-26 NOTE — Telephone Encounter (Signed)
High warning came up when trying to refill medication. Medication was increased to 3 times a day by Dr. Sabra Heck.  Medication pended and sent to Man X Mast, NP

## 2021-11-26 NOTE — Telephone Encounter (Deleted)
Tracey [name] states patient is taking 3 lithium a day now.

## 2021-11-27 DIAGNOSIS — H16223 Keratoconjunctivitis sicca, not specified as Sjogren's, bilateral: Secondary | ICD-10-CM | POA: Diagnosis not present

## 2021-11-27 DIAGNOSIS — H47011 Ischemic optic neuropathy, right eye: Secondary | ICD-10-CM | POA: Diagnosis not present

## 2021-11-27 DIAGNOSIS — H401112 Primary open-angle glaucoma, right eye, moderate stage: Secondary | ICD-10-CM | POA: Diagnosis not present

## 2021-11-27 DIAGNOSIS — R29898 Other symptoms and signs involving the musculoskeletal system: Secondary | ICD-10-CM | POA: Diagnosis not present

## 2021-11-27 DIAGNOSIS — H04123 Dry eye syndrome of bilateral lacrimal glands: Secondary | ICD-10-CM | POA: Diagnosis not present

## 2021-11-27 DIAGNOSIS — H401121 Primary open-angle glaucoma, left eye, mild stage: Secondary | ICD-10-CM | POA: Diagnosis not present

## 2021-11-27 NOTE — Telephone Encounter (Signed)
Lithium carbonate !'50mg'$  Sig: TAKE 1 CAPSULE TWICE DAILY WITH MEALS.    Disp: 180 capsule    Refills: 0 (Pharmacy requested: 1)   Start: 11/26/2021 - 11/26/2021   Class: Normal   Authorized by: Mast, Man X, NP   For: Bipolar I disorder, single manic episode, in full remission [facility])   Pharmacy comment: patient said they take tid now.  Per [facility]

## 2021-11-28 ENCOUNTER — Other Ambulatory Visit: Payer: Self-pay

## 2021-11-28 NOTE — Telephone Encounter (Signed)
[facility] has faxed over prescription refill for second time. They want Nexium '20mg'$  for patient. Medication pend and sent to PCP Mast, Man X, NP for approval.

## 2021-11-29 MED ORDER — ESOMEPRAZOLE MAGNESIUM 20 MG PO CPDR: 20.0000 mg | DELAYED_RELEASE_CAPSULE | Freq: Two times a day (BID) | ORAL | 0 refills | Status: DC

## 2021-12-03 DIAGNOSIS — R29898 Other symptoms and signs involving the musculoskeletal system: Secondary | ICD-10-CM | POA: Diagnosis not present

## 2021-12-05 DIAGNOSIS — R29898 Other symptoms and signs involving the musculoskeletal system: Secondary | ICD-10-CM | POA: Diagnosis not present

## 2021-12-11 DIAGNOSIS — R29898 Other symptoms and signs involving the musculoskeletal system: Secondary | ICD-10-CM | POA: Diagnosis not present

## 2021-12-14 ENCOUNTER — Ambulatory Visit: Payer: Medicare PPO | Admitting: Neurology

## 2021-12-14 DIAGNOSIS — R41841 Cognitive communication deficit: Secondary | ICD-10-CM | POA: Diagnosis not present

## 2021-12-14 DIAGNOSIS — G243 Spasmodic torticollis: Secondary | ICD-10-CM | POA: Diagnosis not present

## 2021-12-14 MED ORDER — ONABOTULINUMTOXINA 100 UNITS IJ SOLR
300.0000 [IU] | Freq: Once | INTRAMUSCULAR | Status: AC
  Administered 2021-12-14: 300 [IU] via INTRAMUSCULAR

## 2021-12-14 NOTE — Procedures (Signed)
[facility]   Procedure Note Botox  Attending: Dr. Wells Guiles Saige Busby  Preoperative Diagnosis(es): Cervical Dystonia  Result History  Doing well with this  Consent obtained from: The patient Benefits discussed included, but were not limited to decreased muscle tightness, increased joint range of motion, and decreased pain.  Risk discussed included, but were not limited pain and discomfort, bleeding, bruising, excessive weakness, venous thrombosis, muscle atrophy and dysphagia.  A copy of the patient medication guide was given to the patient which explains the blackbox warning.  Patients identity and treatment sites confirmed Yes.  .  Details of Procedure: Skin was cleaned with alcohol.  A 30 gauge, 10m  needle was introduced to the target muscle, except for posterior splenius where 27 gauge, 1.5 inch needle used.   Prior to injection, the needle plunger was aspirated to make sure the needle was not within a blood vessel.  There was no blood retrieved on aspiration.    Following is a summary of the muscles injected  And the amount of Botulinum toxin used:   Dilution 0.9% preservative free saline mixed with 100 u Botox type A to make 10 U per 0.1cc  Injections  Location Left  Right Units Number of sites        Sternocleidomastoid 60  60 1  Splenius Capitus, posterior approach  100 100 1  Splenius Capitus, lateral approach  40 40 1  Levator Scapulae      Trapezius 20/20 20/20 80 4  L cervical paraspinal C5 '20  20 1  '$ TOTAL UNITS:   300     Agent: Botulinum Type A ( Onobotulinum Toxin type A ).  3 vials of Botox were used, each containing 100 units and freshly diluted with 1 mL of sterile, non-preserved saline   Total injected (Units): 300  Total wasted (Units): 0   Pt tolerated procedure well without complications.   Reinjection is anticipated in 3 months.  Clinical comment: Patient had numerous separate questions from this.  [facility]  She apparently had an infection in the right eye which has not cleared up and then developed what she called a twitching in the right eye.  The eye doctor told her that it was part of her dystonia.  She also told the patient that she should not be looking into light and she should be staying in the dark.  Patient has noticed she has been off balance more and she is not sure if it is because of "this dystonia" or because she is staying in the dark or because of something different.  I told her that she needed to make a separate appointment for me to evaluate this.  However, the only thing that I saw going on in the right eye was a fasciculation and told her that this did not have anything to do with dystonia.  Discussed Botox for this, but did not recommend it at all.  She is going to make a follow-up appointment for evaluation.

## 2021-12-16 ENCOUNTER — Other Ambulatory Visit: Payer: Self-pay | Admitting: Nurse Practitioner

## 2021-12-16 DIAGNOSIS — F304 Manic episode in full remission: Secondary | ICD-10-CM

## 2021-12-17 NOTE — Telephone Encounter (Signed)
Pharmacy requested refill.  Epic LR: 08/04/2021 Needs Contract, added note to upcoming appointment.   Pended Rx and sent to [facility] for approval.

## 2021-12-18 ENCOUNTER — Other Ambulatory Visit: Payer: Self-pay | Admitting: Nurse Practitioner

## 2021-12-18 DIAGNOSIS — R41841 Cognitive communication deficit: Secondary | ICD-10-CM | POA: Diagnosis not present

## 2021-12-19 ENCOUNTER — Non-Acute Institutional Stay (INDEPENDENT_AMBULATORY_CARE_PROVIDER_SITE_OTHER): Payer: Medicare PPO | Admitting: Family Medicine

## 2021-12-19 ENCOUNTER — Encounter: Payer: Self-pay | Admitting: Family Medicine

## 2021-12-19 VITALS — BP 122/78 | HR 50 | Temp 97.6°F | Ht 63.0 in | Wt 119.6 lb

## 2021-12-19 DIAGNOSIS — G243 Spasmodic torticollis: Secondary | ICD-10-CM

## 2021-12-19 DIAGNOSIS — R1312 Dysphagia, oropharyngeal phase: Secondary | ICD-10-CM

## 2021-12-19 DIAGNOSIS — F411 Generalized anxiety disorder: Secondary | ICD-10-CM | POA: Diagnosis not present

## 2021-12-19 DIAGNOSIS — F304 Manic episode in full remission: Secondary | ICD-10-CM | POA: Diagnosis not present

## 2021-12-19 DIAGNOSIS — R1310 Dysphagia, unspecified: Secondary | ICD-10-CM | POA: Insufficient documentation

## 2021-12-19 MED ORDER — DIVALPROEX SODIUM 125 MG PO DR TAB: 125.0000 mg | DELAYED_RELEASE_TABLET | Freq: Two times a day (BID) | ORAL | 2 refills | Status: DC

## 2021-12-19 MED ORDER — BIOTENE DRY MOUTH MT LOZG: 1.0000 | LOZENGE | Freq: Four times a day (QID) | OROMUCOSAL | 1 refills | Status: DC | PRN

## 2021-12-19 NOTE — Progress Notes (Signed)
Provider:  Alain Honey, MD  Careteam: Patient Care Team: Mast, Man X, NP as PCP - General (Internal Medicine) Marti Sleigh, MD as Consulting Physician (Gynecologic Oncology) Ladell Pier, MD as Consulting Physician (Oncology) Richmond Campbell, MD as Consulting Physician (Gastroenterology) Clent Jacks, MD as Consulting Physician (Ophthalmology) Sabra Heck Precious Haws, MD as Referring Physician (Neurology) Tat, Eustace Quail, DO as Consulting Physician (Neurology)  PLACE OF SERVICE:  [facility]  Advanced Directive information    Allergies  Allergen Reactions   Lisinopril Cough   Penicillins Itching    50 years ago   Adhesive [Tape] Itching   Atorvastatin Itching   Dilaudid [Hydromorphone Hcl] Itching   Hydromorphone Itching    Chief Complaint  Patient presents with   Medical Management of Chronic Issues    Patient presents today for a 1 month follow-up. She reports difficulty swallowing.     HPI: Patient is a 81 y.o. female .  Patient returns today to follow-up for lithium level after lithium was increased at her last visit. She also complains today of difficulty swallowing.  This sensation is feels like food does not want to go down but she does not really become choked.  There was some question about Parkinson's in the past but that was ruled out per her neurologist. She is having this problem and also has a paucity of facial expression when speaking with her I still wonder about Parkinson possibility.  She has complained of some dry mouth and xerostomia for a while.  We stopped antihistamine but now she complains of a drippy nose xerostomia can be a side effect of lithium  Review of Systems:  Review of Systems  Constitutional: Negative.   HENT:  Positive for sore throat.   Respiratory: Negative.    Cardiovascular: Negative.   Psychiatric/Behavioral:  Positive for depression. The patient is nervous/anxious.        History of bipolar disease  All other  systems reviewed and are negative.   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[place])    Heart murmur    History of colon polyps 10/24/2008   Hypertension    Major neurocognitive disorder due to Parkinson's disease, possible    Tremors possible parkinsons   Manic disorder, single episode, in full remission ([place]) 12/01/2009   Sixth nerve palsy    Past Surgical History:  Procedure Laterality Date   BILATERAL SALPINGOOPHORECTOMY  09/22/2007   CATARACT EXTRACTION Bilateral    Gastrointestinal Stroma Tumor,  Other  1960   GIST Surgery   ILEOCECETOMY  09/22/2007   OVARIAN CYST REMOVAL Right 1967   SMALL INTESTINE SURGERY  09/22/2007   TOTAL KNEE ARTHROPLASTY Right 10/05/2019   Procedure: RIGHT TOTAL KNEE ARTHROPLASTY;  Surgeon: Meredith Pel, MD;  Location: [facility];  Service: Orthopedics;  Laterality: Right;   TOTAL VAGINAL HYSTERECTOMY  1986   Fibroids   Social History:   reports that she has never smoked. She has never used smokeless tobacco. She reports that she does not drink alcohol and does not use drugs.  Family History  Problem Relation Age of Onset   Congestive Heart Failure Mother    Dementia  Mother    Heart disease Father    Diabetes Father    Lung cancer Son     Medications: Patient's Medications  New Prescriptions   No medications on file  Previous Medications   ALPRAZOLAM (XANAX) 0.5 MG TABLET    TAKE ONE TABLET BY MOUTH TWICE DAILY AS NEEDED FOR ANXIETY   ASPIRIN 81 MG CHEWABLE TABLET    Chew 81 mg by mouth daily.   CLINDAMYCIN (CLEOCIN) 150 MG CAPSULE    Take '600mg'$  1 hour prior to dental procedure   ESOMEPRAZOLE (NEXIUM) 20 MG CAPSULE     Take 1 capsule (20 mg total) by mouth 2 (two) times daily.   LATANOPROST (XALATAN) 0.005 % OPHTHALMIC SOLUTION    1 drop at bedtime.   LITHIUM CARBONATE 150 MG CAPSULE    Take 1 capsule (150 mg total) by mouth in the morning, at noon, and at bedtime.   LOSARTAN (COZAAR) 50 MG TABLET    Take 50 mg by mouth daily.   MAGNESIUM PO    Take by mouth.   MULTIPLE VITAMIN (MULTIVITAMIN) TABLET    Take 1 tablet by mouth daily.   ONABOTULINUMTOXINA (BOTULINUM TOXIN TYPE A IM)    Inject 100 Units into the muscle.   TIMOLOL (BETIMOL) 0.25 % OPHTHALMIC SOLUTION    1-2 drops 2 (two) times daily.   TOPIRAMATE (TOPAMAX) 25 MG TABLET    TAKE ONE TABLET BY MOUTH TWICE DAILY IN THE MORNING AND IN THE EVENING   TRIAMCINOLONE OINTMENT (KENALOG) 0.5 %    Apply 1 Application topically 2 (two) times daily.   WHITE PETROLATUM-MINERAL OIL OP    Apply 1 application  to eye 2 (two) times daily as needed.  Modified Medications   No medications on file  Discontinued Medications   ALPRAZOLAM (NIRAVAM) 0.5 MG DISSOLVABLE TABLET    Take 1 tablet (0.5 mg total) by mouth 2 (two) times daily as needed for anxiety.   FEXOFENADINE (ALLEGRA) 60 MG TABLET    Take 60 mg by mouth 2 (two) times daily. PRN   TOPIRAMATE (TOPAMAX) 25 MG CAPSULE    Take 25 mg by mouth 2 (two) times daily.    Physical Exam:  There were no vitals filed for this visit. There is no height or weight on file to calculate BMI. Wt Readings from Last 3 Encounters:  11/21/21 128 lb 12.8 oz (58.4 kg)  10/25/21 127 lb 9.6 oz (57.9 kg)  10/18/21 126 lb (57.2 kg)    Physical Exam Vitals and nursing note reviewed.  Constitutional:      Appearance: Normal appearance.  Cardiovascular:     Rate and Rhythm: Normal rate and regular rhythm.  Pulmonary:     Effort: Pulmonary effort is normal.     Breath sounds: Normal breath sounds.  Musculoskeletal:     Cervical back: Normal range of motion.  Neurological:     General: No focal deficit present.     Mental  Status: She is alert and oriented to person, place, and time.     Comments: There is no rigidity or cogwheeling.  There is no tremor. There is relative paucity of facial expression     Labs reviewed: Basic Metabolic Panel: Recent Labs    04/09/21 1716  NA 141  K 4.7  CL 109  CO2 24  GLUCOSE 93  BUN 21  CREATININE 0.99*  CALCIUM 10.7*  TSH 2.08   Liver Function Tests: Recent Labs    04/09/21 1716  AST 20  ALT 11  BILITOT 0.6  PROT 6.6   No results for input(s): "LIPASE", "AMYLASE" in the last 8760 hours. No results for input(s): "AMMONIA" in the last 8760 hours. CBC: Recent Labs    04/09/21 1716  WBC 6.5  NEUTROABS 4,485  HGB 12.8  HCT 38.6  MCV 97.7  PLT 306   Lipid Panel: Recent Labs    04/09/21 1716  CHOL 271*  HDL 67  LDLCALC 166*  TRIG 213*  CHOLHDL 4.0   TSH: Recent Labs    04/09/21 1716  TSH 2.08   A1C: No results found for: "HGBA1C"   Assessment/Plan  1. Bipolar I disorder, single manic episode, in full remission ([place]) We discussed taking her off lithium but she seemed opposed to that since she has been on sotalol.  Will do this in favor of one of the newer methods to treat using mood stabilizers but we decided mutually to continue with home lithium and can add Depakote and low-dose  2. Anxiety state Continue alprazolam as before she takes 0.25 morning and afternoon and 0.5 at bedtime  3. Cervical dystonia He has seen physical therapy here who disputes this diagnosis she did get Botox injection at her neurology office last week    4. Oropharyngeal dysphagia Speech therapy here at [facility] as a starting eval  Alain Honey, MD [place] [phone]

## 2021-12-20 ENCOUNTER — Other Ambulatory Visit: Payer: Self-pay

## 2021-12-20 ENCOUNTER — Other Ambulatory Visit: Payer: Medicare PPO

## 2021-12-20 DIAGNOSIS — F304 Manic episode in full remission: Secondary | ICD-10-CM | POA: Diagnosis not present

## 2021-12-20 LAB — LITHIUM LEVEL: Lithium Lvl: 0.7 mmol/L (ref 0.6–1.2)

## 2021-12-21 DIAGNOSIS — R41841 Cognitive communication deficit: Secondary | ICD-10-CM | POA: Diagnosis not present

## 2021-12-24 DIAGNOSIS — R41841 Cognitive communication deficit: Secondary | ICD-10-CM | POA: Diagnosis not present

## 2021-12-25 DIAGNOSIS — R41841 Cognitive communication deficit: Secondary | ICD-10-CM | POA: Diagnosis not present

## 2021-12-27 DIAGNOSIS — R41841 Cognitive communication deficit: Secondary | ICD-10-CM | POA: Diagnosis not present

## 2021-12-28 DIAGNOSIS — R41841 Cognitive communication deficit: Secondary | ICD-10-CM | POA: Diagnosis not present

## 2022-01-01 DIAGNOSIS — R41841 Cognitive communication deficit: Secondary | ICD-10-CM | POA: Diagnosis not present

## 2022-01-02 ENCOUNTER — Other Ambulatory Visit: Payer: Self-pay | Admitting: Nurse Practitioner

## 2022-01-02 DIAGNOSIS — R1312 Dysphagia, oropharyngeal phase: Secondary | ICD-10-CM

## 2022-01-02 DIAGNOSIS — K22719 Barrett's esophagus with dysplasia, unspecified: Secondary | ICD-10-CM

## 2022-01-02 DIAGNOSIS — R41841 Cognitive communication deficit: Secondary | ICD-10-CM | POA: Diagnosis not present

## 2022-01-02 NOTE — Progress Notes (Signed)
Per Helene Kelp, Nurse at [facility], stated that patient is in [place] and a referral has to be placed. Order was written for GI Consult and signed by Dr. Sabra Heck.   Referral placed as requested.

## 2022-01-03 ENCOUNTER — Ambulatory Visit: Payer: Medicare PPO | Admitting: Nurse Practitioner

## 2022-01-03 ENCOUNTER — Other Ambulatory Visit: Payer: Self-pay | Admitting: Nurse Practitioner

## 2022-01-03 ENCOUNTER — Encounter: Payer: Self-pay | Admitting: Nurse Practitioner

## 2022-01-03 VITALS — BP 112/60 | HR 84 | Temp 97.5°F | Resp 18 | Ht 63.0 in | Wt 118.0 lb

## 2022-01-03 DIAGNOSIS — I83892 Varicose veins of left lower extremities with other complications: Secondary | ICD-10-CM

## 2022-01-03 DIAGNOSIS — F304 Manic episode in full remission: Secondary | ICD-10-CM | POA: Diagnosis not present

## 2022-01-03 DIAGNOSIS — I10 Essential (primary) hypertension: Secondary | ICD-10-CM | POA: Diagnosis not present

## 2022-01-03 DIAGNOSIS — R1312 Dysphagia, oropharyngeal phase: Secondary | ICD-10-CM

## 2022-01-03 DIAGNOSIS — B370 Candidal stomatitis: Secondary | ICD-10-CM | POA: Diagnosis not present

## 2022-01-03 DIAGNOSIS — G243 Spasmodic torticollis: Secondary | ICD-10-CM

## 2022-01-03 DIAGNOSIS — K219 Gastro-esophageal reflux disease without esophagitis: Secondary | ICD-10-CM | POA: Diagnosis not present

## 2022-01-03 DIAGNOSIS — K5901 Slow transit constipation: Secondary | ICD-10-CM

## 2022-01-03 DIAGNOSIS — E785 Hyperlipidemia, unspecified: Secondary | ICD-10-CM

## 2022-01-03 DIAGNOSIS — R001 Bradycardia, unspecified: Secondary | ICD-10-CM

## 2022-01-03 DIAGNOSIS — M159 Polyosteoarthritis, unspecified: Secondary | ICD-10-CM

## 2022-01-03 DIAGNOSIS — R4189 Other symptoms and signs involving cognitive functions and awareness: Secondary | ICD-10-CM

## 2022-01-03 MED ORDER — FLUCONAZOLE 100 MG PO TABS: 100.0000 mg | ORAL_TABLET | Freq: Every day | ORAL | 1 refills | Status: DC

## 2022-01-03 MED ORDER — TIMOLOL HEMIHYDRATE 0.25 % OP SOLN: 1.0000 [drp] | Freq: Two times a day (BID) | OPHTHALMIC | 3 refills | Status: DC

## 2022-01-03 NOTE — Assessment & Plan Note (Signed)
f/u endocrinology, Ca 10.7 04/09/21,  PTH 79 01/12/20. GDR of Lithium may help

## 2022-01-03 NOTE — Assessment & Plan Note (Signed)
12/2019 MRI brain showed chronic microvascular ischemic changes, no acute disease with mild cortical atrophy, TSH 2.08 04/09/21

## 2022-01-03 NOTE — Assessment & Plan Note (Signed)
LDL 166 04/09/21, on diet, allergic to statin.

## 2022-01-03 NOTE — Assessment & Plan Note (Signed)
improved from 40s to 50s. Hx of normalized HR after Tylenol PM dc'd, GDR of Lithium helped too.

## 2022-01-03 NOTE — Assessment & Plan Note (Signed)
stable, on Lithium, GDR due to bradycardia, takes Alprazolam too, TSH 2.08 04/09/21. Dr Sabra Heck started low dose of Depakote. Lithium level 0.7 12/20/21

## 2022-01-03 NOTE — Assessment & Plan Note (Addendum)
Diflucan '100mg'$  qd x7 days, may repeat 7 more days if no better.  thrush identified on swallow study, FEES showed diffused white secretion coating areas of white plaques throughout pharynx. The patient c/o sore in buccal sides of tongue and in her throat when swallows.

## 2022-01-03 NOTE — Assessment & Plan Note (Signed)
Cervical degenerative changes/dystonia, better, CT/MRI 12/2019 ruled out acute process, aches in neck R>L travels to the back of head, comes and goes, not disabling, f/u Neurosurgery. Failed Cymbalta, Gabapentin, Robaxin, Ultracet. s/p Botulinum Toxin inj by Neurology. ESR 9, CRP 2.7 10/09/20. The patient stated Alprazolam and Tylenol are kind of effective. Improved on Topamax, but associated with weight loss.  No Parkinson's disease, took her off Sinemet per Neurology.

## 2022-01-03 NOTE — Assessment & Plan Note (Signed)
stable, on Omeprazole, Hgb 12.8 04/09/21

## 2022-01-03 NOTE — Assessment & Plan Note (Addendum)
GI referral made, working with ST, thickens liquids.

## 2022-01-03 NOTE — Assessment & Plan Note (Signed)
takes  Tylenol, s/p R knee arthroplasty.

## 2022-01-03 NOTE — Assessment & Plan Note (Signed)
Metamucil, MiraLax,  effective.

## 2022-01-03 NOTE — Assessment & Plan Note (Signed)
blood pressure is controlled on Losartan

## 2022-01-03 NOTE — Assessment & Plan Note (Signed)
LLE negative DVT venous US, L>R. Medial thickened swelling area about her palm sized, mild warmth, redness, and indurated area for a few month. Varicose veins LLE not new.

## 2022-01-03 NOTE — Progress Notes (Signed)
Location:   Clinic   Place of Service:    Provider: Marlana Latus NP  Code Status: DNR Goals of Care:     10/16/2021    1:22 PM  Advanced Directives  Does Patient Have a Medical Advance Directive? No     Chief Complaint  Patient presents with   Acute Visit    Patient is here for oral thrush    HPI: Patient is a 81 y.o. female seen today for thrush identified on swallow study, FEES showed diffused white secretion coating areas of white plaques throughout pharynx. The patient c/o sore in buccal sides of tongue and in her throat when swallows.    Edema, LLE negative DVT venous US, L>R. Medial thickened swelling area about her palm sized, mild warmth, redness, and indurated area for a few month. Varicose veins LLE not new.  Tinnitus, underwent eval of  ENT, suggested music background.              Hx of GIST of small intestine s/p resection. MiraLax, Psyllium   Dysphagia, GI referral made, working with ST, thicken liquids.              Constipation, Metamucil, MiraLax,  effective.              GERD, stable, on Omeprazole, Hgb 12.8 04/09/21             HTN, blood pressure is controlled on Losartan             Bipolar disorder, stable, on Lithium, GDR due to bradycardia, takes Alprazolam too, TSH 2.08 04/09/21. Dr Sabra Heck started low dose of Depakote. Lithium level 0.7 12/20/21             Bradycardia, improved from 40s to 50s. Hx of normalized HR after Tylenol PM dc'd, GDR of Lithium helped too.              Hypercalcemia, f/u endocrinology, Ca 10.7 04/09/21,  PTH 79 01/12/20. GDR of Lithium may help             OA, takes  Tylenol, s/p R knee arthroplasty.             Cervical degenerative changes/dystonia, better, CT/MRI 12/2019 ruled out acute process, aches in neck R>L travels to the back of head, comes and goes, not disabling, f/u Neurosurgery. Failed Cymbalta, Gabapentin, Robaxin, Ultracet. s/p Botulinum Toxin inj by Neurology. ESR 9, CRP 2.7 10/09/20. The patient stated Alprazolam and  Tylenol are kind of effective. Improved on Topamax, but associated with weight loss.  No Parkinson's disease, took her off Sinemet per Neurology.              Cognitive impairment: 12/2019 MRI brain showed chronic microvascular ischemic changes, no acute disease with mild cortical atrophy, TSH 2.08 04/09/21             Hyperlipidemia, LDL 166 04/09/21, on diet, allergic to statin.    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[place])    Heart murmur    History of colon polyps 10/24/2008   Hypertension    Major neurocognitive disorder due to Parkinson's disease, possible  Tremors possible parkinsons   Manic disorder, single episode, in full remission ([place]) 12/01/2009   Sixth nerve palsy     Past Surgical History:  Procedure Laterality Date   BILATERAL SALPINGOOPHORECTOMY  09/22/2007   CATARACT EXTRACTION Bilateral    Gastrointestinal Stroma Tumor,  Other  1960   GIST Surgery   ILEOCECETOMY  09/22/2007   OVARIAN CYST REMOVAL Right 1967   SMALL INTESTINE SURGERY  09/22/2007   TOTAL KNEE ARTHROPLASTY Right 10/05/2019   Procedure: RIGHT TOTAL KNEE ARTHROPLASTY;  Surgeon: Meredith Pel, MD;  Location: [facility];  Service: Orthopedics;  Laterality: Right;   TOTAL VAGINAL HYSTERECTOMY  1986   Fibroids    Allergies  Allergen Reactions   Lisinopril Cough   Penicillins Itching    50 years ago   Adhesive [Tape] Itching   Atorvastatin Itching   Dilaudid [Hydromorphone Hcl] Itching   Hydromorphone Itching    Allergies as of 01/03/2022       Reactions   Lisinopril Cough   Penicillins Itching   50 years ago   Adhesive [tape] Itching   Atorvastatin Itching    Dilaudid [hydromorphone Hcl] Itching   Hydromorphone Itching        Medication List        Accurate as of January 03, 2022  2:51 PM. If you have any questions, ask your nurse or doctor.          ALPRAZolam 0.5 MG tablet Commonly known as: XANAX TAKE ONE TABLET BY MOUTH TWICE DAILY AS NEEDED FOR ANXIETY   aspirin 81 MG chewable tablet Chew 81 mg by mouth daily.   Biotene Dry Mouth Lozg Use as directed 1 tablet in the mouth or throat 4 (four) times daily as needed.   BOTULINUM TOXIN TYPE A IM Inject 100 Units into the muscle.   clindamycin 150 MG capsule Commonly known as: CLEOCIN Take 629m 1 hour prior to dental procedure   divalproex 125 MG DR tablet Commonly known as: Depakote Take 1 tablet (125 mg total) by mouth 2 (two) times daily.   esomeprazole 20 MG capsule Commonly known as: NexIUM Take 1 capsule (20 mg total) by mouth 2 (two) times daily.   fluconazole 100 MG tablet Commonly known as: Diflucan Take 1 tablet (100 mg total) by mouth daily. Started by: Temekia Caskey X Dillon Livermore, NP   latanoprost 0.005 % ophthalmic solution Commonly known as: XALATAN 1 drop at bedtime.   lithium carbonate 150 MG capsule Take 1 capsule (150 mg total) by mouth in the morning, at noon, and at bedtime.   losartan 50 MG tablet Commonly known as: COZAAR Take 50 mg by mouth daily.   MAGNESIUM PO Take by mouth.   multivitamin tablet Take 1 tablet by mouth daily.   timolol 0.25 % ophthalmic solution Commonly known as: BETIMOL Place 1-2 drops into both eyes 2 (two) times daily. What changed: how to take this Changed by: Brenton Joines X Ercelle Winkles, NP   topiramate 25 MG tablet Commonly known as: TOPAMAX TAKE ONE TABLET BY MOUTH TWICE DAILY IN THE MORNING AND IN THE EVENING   triamcinolone ointment 0.5 % Commonly known as: KENALOG Apply 1 Application topically 2 (two) times daily.        Review of Systems:  Review of Systems  Constitutional:  Negative for appetite change, fatigue and  fever.  HENT:  Positive for hearing loss, mouth sores, tinnitus and trouble swallowing. Negative for congestion, postnasal drip, rhinorrhea and sore throat.  C/o sore buccal aspect tongue and in throat when swallows.   Eyes:  Negative for visual disturbance.       Glaucoma, diplopia R+L lateral peripheral visual fields only.   Respiratory:  Negative for shortness of breath.        Occasionally DOE, hacking cough  Cardiovascular:  Positive for leg swelling.  Gastrointestinal:  Negative for abdominal pain and constipation.  Genitourinary:  Positive for frequency. Negative for dysuria and urgency.       Couple of times at night, no difficulty of returning asleep.   Musculoskeletal:  Positive for arthralgias and gait problem.       Walker.   Skin:  Negative for color change.  Neurological:  Negative for tremors, speech difficulty, light-headedness and headaches.       Comes and goes nature of the neck/occipital pain. Stiff neck. Pain the patent right knee, s/p TKR. Tremor is better since Sinemet.   Psychiatric/Behavioral:  Positive for sleep disturbance. Negative for confusion. The patient is nervous/anxious.        Better mood, feels not as anxious or depressive mood as prior.     Health Maintenance  Topic Date Due   Medicare Annual Wellness (AWV)  11/05/2019   DTaP/Tdap/Td (2 - Td or Tdap) 08/19/2021   COVID-19 Vaccine (5 - 2023-24 season) 01/10/2022   Pneumonia Vaccine 108+ Years old  Completed   INFLUENZA VACCINE  Completed   DEXA SCAN  Completed   Zoster Vaccines- Shingrix  Completed   HPV VACCINES  Aged Out    Physical Exam: Vitals:   01/03/22 1317  BP: 112/60  Pulse: 84  Resp: 18  Temp: (!) 97.5 F (36.4 C)  SpO2: 97%  Weight: 118 lb (53.5 kg)  Height: _0  (1.6 m)   Body mass index is 20.9 kg/m. Physical Exam Vitals and nursing note reviewed.  Constitutional:      Appearance: Normal appearance.  HENT:     Head: Normocephalic and atraumatic.     Nose: Nose  normal. No congestion or rhinorrhea.     Mouth/Throat:     Mouth: Mucous membranes are moist.     Pharynx: No oropharyngeal exudate or posterior oropharyngeal erythema.  Eyes:     Extraocular Movements: Extraocular movements intact.     Conjunctiva/sclera: Conjunctivae normal.     Pupils: Pupils are equal, round, and reactive to light.  Cardiovascular:     Rate and Rhythm: Regular rhythm. Bradycardia present.     Heart sounds: Murmur heard.     Comments: DP pulses present R+L. HR in 50s Pulmonary:     Effort: Pulmonary effort is normal.     Breath sounds: No rales.  Abdominal:     General: Bowel sounds are normal.     Palpations: Abdomen is soft.     Tenderness: There is no abdominal tenderness.  Musculoskeletal:     Cervical back: Normal range of motion and neck supple.     Right lower leg: Edema present.     Left lower leg: Edema present.     Comments: Chronic R knee pain is improved. S/p TKR right. Stiff neck, comes and goes neck/occipital pain. Trace edema BLE  Skin:    General: Skin is warm and dry.     Comments: Improved the medial lower left leg a palm sized thickened, slightly reddened area, mild discomfort when palpated. DP present left foot. Scattered varicose veins left leg   Neurological:     General: No focal deficit present.  Mental Status: She is alert and oriented to person, place, and time. Mental status is at baseline.     Motor: No weakness.     Coordination: Coordination abnormal.     Gait: Gait abnormal.     Comments: Resting tremor in fingers, near resolution.   Psychiatric:        Mood and Affect: Mood normal.        Behavior: Behavior normal.        Thought Content: Thought content normal.     Labs reviewed: Basic Metabolic Panel: Recent Labs    04/09/21 1716  NA 141  K 4.7  CL 109  CO2 24  GLUCOSE 93  BUN 21  CREATININE 0.99*  CALCIUM 10.7*  TSH 2.08   Liver Function Tests: Recent Labs    04/09/21 1716  AST 20  ALT 11  BILITOT  0.6  PROT 6.6   No results for input(s): "LIPASE", "AMYLASE" in the last 8760 hours. No results for input(s): "AMMONIA" in the last 8760 hours. CBC: Recent Labs    04/09/21 1716  WBC 6.5  NEUTROABS 4,485  HGB 12.8  HCT 38.6  MCV 97.7  PLT 306   Lipid Panel: Recent Labs    04/09/21 1716  CHOL 271*  HDL 67  LDLCALC 166*  TRIG 213*  CHOLHDL 4.0   No results found for: "HGBA1C"  Procedures since last visit: No results found.  Assessment/Plan  Oropharyngeal candidiasis Diflucan 170m qd x7 days, may repeat 7 more days if no better.  thrush identified on swallow study, FEES showed diffused white secretion coating areas of white plaques throughout pharynx. The patient c/o sore in buccal sides of tongue and in her throat when swallows.   GERD (gastroesophageal reflux disease) stable, on Omeprazole, Hgb 12.8 04/09/21  Essential (primary) hypertension blood pressure is controlled on Losartan  Bipolar I disorder, single manic episode, in full remission [place]  stable, on Lithium, GDR due to bradycardia, takes Alprazolam too, TSH 2.08 04/09/21. Dr MSabra Heckstarted low dose of Depakote. Lithium level 0.7 12/20/21  Bradycardia  improved from 40s to 50s. Hx of normalized HR after Tylenol PM dc'd, GDR of Lithium helped too.  Hypercalcemia  f/u endocrinology, Ca 10.7 04/09/21,  PTH 79 01/12/20. GDR of Lithium may help  Arthritis, degenerative  takes  Tylenol, s/p R knee arthroplasty.   Cervical dystonia   Cervical degenerative changes/dystonia, better, CT/MRI 12/2019 ruled out acute process, aches in neck R>L travels to the back of head, comes and goes, not disabling, f/u Neurosurgery. Failed Cymbalta, Gabapentin, Robaxin, Ultracet. s/p Botulinum Toxin inj by Neurology. ESR 9, CRP 2.7 10/09/20. The patient stated Alprazolam and Tylenol are kind of effective. Improved on Topamax, but associated with weight loss.  No Parkinson's disease, took her off Sinemet per Neurology.   Cognitive  impairment 12/2019 MRI brain showed chronic microvascular ischemic changes, no acute disease with mild cortical atrophy, TSH 2.08 04/09/21  HLD (hyperlipidemia) LDL 166 04/09/21, on diet, allergic to statin.   Slow transit constipation Metamucil, MiraLax,  effective.   Dysphagia GI referral made, working with ST, thickens liquids.   Varicose veins of left leg with edema  LLE negative DVT venous [place] L>R. Medial thickened swelling area about her palm sized, mild warmth, redness, and indurated area for a few month. Varicose veins LLE not new.    Labs/tests ordered:  none  Next appt:  01/16/21 app with Dr. MDorothy Puffer  AWV 2 weeks.

## 2022-01-04 DIAGNOSIS — R41841 Cognitive communication deficit: Secondary | ICD-10-CM | POA: Diagnosis not present

## 2022-01-09 DIAGNOSIS — R41841 Cognitive communication deficit: Secondary | ICD-10-CM | POA: Diagnosis not present

## 2022-01-15 DIAGNOSIS — R41841 Cognitive communication deficit: Secondary | ICD-10-CM | POA: Diagnosis not present

## 2022-01-16 ENCOUNTER — Other Ambulatory Visit (HOSPITAL_COMMUNITY): Payer: Self-pay

## 2022-01-16 ENCOUNTER — Telehealth (HOSPITAL_COMMUNITY): Payer: Self-pay

## 2022-01-16 ENCOUNTER — Encounter: Payer: Self-pay | Admitting: Family Medicine

## 2022-01-16 ENCOUNTER — Non-Acute Institutional Stay (INDEPENDENT_AMBULATORY_CARE_PROVIDER_SITE_OTHER): Payer: Medicare PPO | Admitting: Family Medicine

## 2022-01-16 VITALS — BP 116/60 | HR 50 | Temp 97.0°F | Ht 63.0 in | Wt 117.8 lb

## 2022-01-16 DIAGNOSIS — R1310 Dysphagia, unspecified: Secondary | ICD-10-CM

## 2022-01-16 DIAGNOSIS — R4189 Other symptoms and signs involving cognitive functions and awareness: Secondary | ICD-10-CM

## 2022-01-16 DIAGNOSIS — F304 Manic episode in full remission: Secondary | ICD-10-CM

## 2022-01-16 DIAGNOSIS — G243 Spasmodic torticollis: Secondary | ICD-10-CM | POA: Diagnosis not present

## 2022-01-16 DIAGNOSIS — B370 Candidal stomatitis: Secondary | ICD-10-CM | POA: Diagnosis not present

## 2022-01-16 DIAGNOSIS — F411 Generalized anxiety disorder: Secondary | ICD-10-CM

## 2022-01-16 DIAGNOSIS — R1312 Dysphagia, oropharyngeal phase: Secondary | ICD-10-CM | POA: Diagnosis not present

## 2022-01-16 MED ORDER — FLUCONAZOLE 150 MG PO TABS: 150.0000 mg | ORAL_TABLET | Freq: Every day | ORAL | 0 refills | Status: DC

## 2022-01-16 NOTE — Progress Notes (Signed)
Provider:  Alain Honey, MD  Careteam: Patient Care Team: Mast, Man X, NP as PCP - General (Internal Medicine) Marti Sleigh, MD as Consulting Physician (Gynecologic Oncology) Ladell Pier, MD as Consulting Physician (Oncology) Richmond Campbell, MD as Consulting Physician (Gastroenterology) Clent Jacks, MD as Consulting Physician (Ophthalmology) Sabra Heck Precious Haws, MD as Referring Physician (Neurology) Tat, Eustace Quail, DO as Consulting Physician (Neurology)  PLACE OF SERVICE:  [place]  Advanced Directive information    Allergies  Allergen Reactions   Lisinopril Cough   Penicillins Itching    50 years ago   Adhesive [Tape] Itching   Atorvastatin Itching   Dilaudid [Hydromorphone Hcl] Itching   Hydromorphone Itching    Chief Complaint  Patient presents with   Medical Management of Chronic Issues    Patient presents today for a 1 month follow-up   Quality Metric Gaps    Awv, TDAP,COVID#5     HPI: Patient is a 82 y.o. female .  Tracey [name] is here with her usual multiple symptoms.  She continues to have trouble swallowing.  Had initial evaluation was placed on thickened liquids but was suggested that she get a barium swallow.  She does not like to use the thicken in the dining room. Most of the conversation today was centered around her tiredness.  She tells me she sleeps well but wakes up feeling tired.  We did start Depakote 1 month ago as well as increase lithium to a third dose during the day.  Possibly those medicines have to do with her current symptoms but in talking to her there is no eye contact and she seems very sad and depressed.  That oftentimes is a symptom related to malaise and fatigue and especially tiredness upon arising in the mornings.  She does have a history of bipolar disorder and has not been on antidepressants.  She does continue to take alprazolam which helps her rest at night  Review of Systems:  Review of Systems  Constitutional:   Positive for malaise/fatigue.  Respiratory: Negative.    Cardiovascular: Negative.   Genitourinary: Negative.   Musculoskeletal:  Positive for neck pain.  Neurological: Negative.   Psychiatric/Behavioral:  Positive for depression. The patient is nervous/anxious.        She does endorse lots of stress  All other systems reviewed and are negative.   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[place])    Heart murmur    History of colon polyps 10/24/2008   Hypertension    Major neurocognitive disorder due to Parkinson's disease, possible    Tremors possible parkinsons   Manic disorder, single episode, in full remission ([place]) 12/01/2009   Sixth nerve palsy    Past Surgical History:  Procedure Laterality Date   BILATERAL SALPINGOOPHORECTOMY  09/22/2007   CATARACT EXTRACTION Bilateral    Gastrointestinal Stroma Tumor,  Other  1960   GIST Surgery   ILEOCECETOMY  09/22/2007   OVARIAN CYST REMOVAL Right 1967   SMALL INTESTINE SURGERY  09/22/2007   TOTAL KNEE ARTHROPLASTY Right 10/05/2019   Procedure: RIGHT TOTAL KNEE ARTHROPLASTY;  Surgeon: Meredith Pel, MD;  Location: [facility];  Service: Orthopedics;  Laterality: Right;   TOTAL VAGINAL HYSTERECTOMY  1986   Fibroids   Social History:   reports that she has never smoked. She has never used smokeless tobacco. She reports that she does not drink alcohol and does not use drugs.  Family History  Problem Relation Age of Onset   Congestive Heart Failure Mother    Dementia Mother    Heart disease Father    Diabetes Father    Lung cancer Son     Medications: Patient's Medications  New Prescriptions   FLUCONAZOLE  (DIFLUCAN) 150 MG TABLET    Take 1 tablet (150 mg total) by mouth daily.  Previous Medications   ALPRAZOLAM (XANAX) 0.5 MG TABLET    TAKE ONE TABLET BY MOUTH TWICE DAILY AS NEEDED FOR ANXIETY   ARTIFICIAL SALIVA (BIOTENE DRY MOUTH) LOZG    Use as directed 1 tablet in the mouth or throat 4 (four) times daily as needed.   ASPIRIN 81 MG CHEWABLE TABLET    Chew 81 mg by mouth daily.   BETIMOL 0.25 % OPHTHALMIC SOLUTION    Place 1-2 drops into both eyes 2 (two) times daily.   CLINDAMYCIN (CLEOCIN) 150 MG CAPSULE    Take '600mg'$  1 hour prior to dental procedure   DIVALPROEX (DEPAKOTE) 125 MG DR TABLET    Take 1 tablet (125 mg total) by mouth 2 (two) times daily.   ESOMEPRAZOLE (NEXIUM) 20 MG CAPSULE    Take 1 capsule (20 mg total) by mouth 2 (two) times daily.   LATANOPROST (XALATAN) 0.005 % OPHTHALMIC SOLUTION    1 drop at bedtime.   LITHIUM CARBONATE 150 MG CAPSULE    Take 1 capsule (150 mg total) by mouth in the morning, at noon, and at bedtime.   LOSARTAN (COZAAR) 50 MG TABLET    Take 50 mg by mouth daily.   MAGNESIUM PO    Take by mouth.   MULTIPLE VITAMIN (MULTIVITAMIN) TABLET    Take 1 tablet by mouth daily.   ONABOTULINUMTOXINA (BOTULINUM TOXIN TYPE A IM)    Inject 100 Units into the muscle.   TOPIRAMATE (TOPAMAX) 25 MG TABLET    TAKE ONE TABLET BY MOUTH TWICE DAILY IN THE MORNING AND IN THE EVENING   TRIAMCINOLONE OINTMENT (KENALOG) 0.5 %    Apply 1 Application topically 2 (two) times daily.  Modified Medications   No medications on file  Discontinued Medications   FLUCONAZOLE (DIFLUCAN) 100 MG TABLET    Take 1 tablet (100 mg total) by mouth daily.    Physical Exam:  Vitals:   01/16/22 1428  BP: 116/60  Pulse: (!) 50  Temp: (!) 97 F (36.1 C)  SpO2: 98%  Weight: 117 lb 12.8 oz (53.4 kg)  Height: '5\' 3"'$  (1.6 m)   Body mass index is 20.87 kg/m. Wt Readings from Last 3 Encounters:  01/16/22 117 lb 12.8 oz (53.4 kg)  01/03/22 118 lb (53.5 kg)  12/19/21 119 lb 9.6 oz (54.3 kg)     Physical Exam Vitals and nursing note reviewed.  Constitutional:      Appearance: Normal appearance.  Cardiovascular:     Rate and Rhythm: Normal rate and regular rhythm.  Pulmonary:     Breath sounds: Normal breath sounds.  Neurological:     General: No focal deficit present.     Mental Status: She is alert and oriented to person, place, and time.     Comments: Slums test was administered today and she scored 13 out of 30.  In interviewing her during the course of our visit I am surprised that is that low.  She admits to not being able to focus due to stress.  This could also be another symptom of psychomotor retardation associated with depression     Labs reviewed: Basic Metabolic Panel: Recent Labs    04/09/21 1716  NA 141  K 4.7  CL 109  CO2 24  GLUCOSE 93  BUN 21  CREATININE 0.99*  CALCIUM 10.7*  TSH 2.08   Liver Function Tests: Recent Labs    04/09/21 1716  AST 20  ALT 11  BILITOT 0.6  PROT 6.6   No results for input(s): "LIPASE", "AMYLASE" in the last 8760 hours. No results for input(s): "AMMONIA" in the last 8760 hours. CBC: Recent Labs    04/09/21 1716  WBC 6.5  NEUTROABS 4,485  HGB 12.8  HCT 38.6  MCV 97.7  PLT 306   Lipid Panel: Recent Labs    04/09/21 1716  CHOL 271*  HDL 67  LDLCALC 166*  TRIG 213*  CHOLHDL 4.0   TSH: Recent Labs    04/09/21 1716  TSH 2.08   A1C: No results found for: "HGBA1C"   Assessment/Plan  1. Oropharyngeal dysphagia Will order barium swallow  2. Oropharyngeal candidiasis Patient has been told by speech therapy that possibility she has candidiasis will treat with Diflucan for daily for 1 week  3. Anxiety state Continue alprazolam but I am not sure at this point that her problem is more depression than anxiety  4. Bipolar I disorder, single manic episode, in full remission ([place]) Continue lithium but taper off Depakote  5. Cervical dystonia Patient continues to have problems with turning her  head and range of motion has been treated with botulinum toxin by neurology  6. Cognitive impairment Unsure at this point whether she has early dementia or it is more of a depressive stress type situation.  Will taper off medicines above and repeat slums test in about a month   Alain Honey, MD [place] [phone]

## 2022-01-16 NOTE — Patient Instructions (Signed)
Take Lithium twice a day AM and PM Reduce depakote to 1/day for 1 week, then stop

## 2022-01-17 ENCOUNTER — Ambulatory Visit: Payer: Medicare PPO | Admitting: Nurse Practitioner

## 2022-01-18 DIAGNOSIS — R41841 Cognitive communication deficit: Secondary | ICD-10-CM | POA: Diagnosis not present

## 2022-01-22 DIAGNOSIS — R41841 Cognitive communication deficit: Secondary | ICD-10-CM | POA: Diagnosis not present

## 2022-01-23 DIAGNOSIS — R41841 Cognitive communication deficit: Secondary | ICD-10-CM | POA: Diagnosis not present

## 2022-01-25 DIAGNOSIS — R41841 Cognitive communication deficit: Secondary | ICD-10-CM | POA: Diagnosis not present

## 2022-01-29 ENCOUNTER — Encounter: Payer: Self-pay | Admitting: Nurse Practitioner

## 2022-01-29 DIAGNOSIS — R41841 Cognitive communication deficit: Secondary | ICD-10-CM | POA: Diagnosis not present

## 2022-01-31 ENCOUNTER — Ambulatory Visit (HOSPITAL_COMMUNITY)
Admission: RE | Admit: 2022-01-31 | Discharge: 2022-01-31 | Disposition: A | Discharge status: Home / Self Care | Payer: Medicare PPO | Source: Ambulatory Visit | Attending: Nurse Practitioner | Admitting: Nurse Practitioner

## 2022-01-31 DIAGNOSIS — R1310 Dysphagia, unspecified: Secondary | ICD-10-CM

## 2022-01-31 DIAGNOSIS — R1312 Dysphagia, oropharyngeal phase: Secondary | ICD-10-CM | POA: Diagnosis not present

## 2022-02-01 DIAGNOSIS — H52223 Regular astigmatism, bilateral: Secondary | ICD-10-CM | POA: Diagnosis not present

## 2022-02-01 DIAGNOSIS — H524 Presbyopia: Secondary | ICD-10-CM | POA: Diagnosis not present

## 2022-02-01 DIAGNOSIS — H47011 Ischemic optic neuropathy, right eye: Secondary | ICD-10-CM | POA: Diagnosis not present

## 2022-02-01 DIAGNOSIS — H4922 Sixth [abducent] nerve palsy, left eye: Secondary | ICD-10-CM | POA: Diagnosis not present

## 2022-02-01 DIAGNOSIS — H02205 Unspecified lagophthalmos left lower eyelid: Secondary | ICD-10-CM | POA: Diagnosis not present

## 2022-02-01 DIAGNOSIS — H02204 Unspecified lagophthalmos left upper eyelid: Secondary | ICD-10-CM | POA: Diagnosis not present

## 2022-02-04 DIAGNOSIS — R41841 Cognitive communication deficit: Secondary | ICD-10-CM | POA: Diagnosis not present

## 2022-02-06 ENCOUNTER — Encounter: Payer: Self-pay | Admitting: Nurse Practitioner

## 2022-02-06 DIAGNOSIS — R41841 Cognitive communication deficit: Secondary | ICD-10-CM | POA: Diagnosis not present

## 2022-02-07 ENCOUNTER — Other Ambulatory Visit: Payer: Self-pay | Admitting: Nurse Practitioner

## 2022-02-07 ENCOUNTER — Other Ambulatory Visit: Payer: Self-pay

## 2022-02-07 ENCOUNTER — Non-Acute Institutional Stay: Payer: Medicare PPO | Admitting: Nurse Practitioner

## 2022-02-07 ENCOUNTER — Encounter: Payer: Self-pay | Admitting: Nurse Practitioner

## 2022-02-07 VITALS — BP 118/74 | HR 49 | Temp 96.9°F | Resp 16 | Ht 63.0 in | Wt 117.0 lb

## 2022-02-07 DIAGNOSIS — F304 Manic episode in full remission: Secondary | ICD-10-CM

## 2022-02-07 DIAGNOSIS — Z0000 Encounter for general adult medical examination without abnormal findings: Secondary | ICD-10-CM | POA: Diagnosis not present

## 2022-02-07 NOTE — Progress Notes (Signed)
Subjective:   Tracey [name] is a 82 y.o. female who presents for Medicare Annual (Subsequent) preventive examination in the Clinic @ [place].        Objective:    Today's Vitals   02/07/22 1305 02/07/22 1328  BP: 118/74   Pulse: (!) 49   Resp: 16   Temp: (!) 96.9 F (36.1 C)   SpO2: 99%   Weight: 117 lb (53.1 kg)   Height: '5\' 3"'$  (1.6 m)   PainSc:  2    Body mass index is 20.73 kg/m.     02/06/2022    8:49 AM 10/16/2021    1:22 PM 10/05/2021    9:10 AM 01/04/2021    9:34 AM 12/28/2020   11:14 AM 10/23/2020    8:18 AM 09/14/2020    4:05 PM  Advanced Directives  Does Patient Have a Medical Advance Directive? No No Yes Yes Yes Yes Yes  Type of Primary school teacher of [place];Living will [place];Living will Living will [place]  Does patient want to make changes to medical advance directive? No - Patient declined   No - Patient declined No - Patient declined  No - Patient declined  Copy of [place] in Chart?    Yes - validated most recent copy scanned in chart (See row information) Yes - validated most recent copy scanned in chart (See row information)  Yes - validated most recent copy scanned in chart (See row information)    Current Medications (verified) Outpatient Encounter Medications as of 02/07/2022  Medication Sig   ALPRAZolam (XANAX) 0.5 MG tablet TAKE ONE TABLET BY MOUTH TWICE DAILY AS NEEDED FOR ANXIETY   Artificial Saliva (BIOTENE DRY MOUTH) LOZG Use as directed 1 tablet in the mouth or throat 4 (four) times daily as needed.   aspirin 81 MG chewable tablet Chew 81 mg by mouth daily.   BETIMOL 0.25 % ophthalmic solution Place 1-2 drops into both eyes 2 (two) times daily.   clindamycin (CLEOCIN) 150 MG capsule Take '600mg'$  1 hour prior to dental procedure   divalproex (DEPAKOTE) 125 MG DR tablet Take 1 tablet (125 mg total) by mouth 2 (two) times  daily.   esomeprazole (NEXIUM) 20 MG capsule Take 1 capsule (20 mg total) by mouth 2 (two) times daily.   fluconazole (DIFLUCAN) 150 MG tablet Take 1 tablet (150 mg total) by mouth daily.   latanoprost (XALATAN) 0.005 % ophthalmic solution 1 drop at bedtime.   lithium carbonate 150 MG capsule Take 1 capsule (150 mg total) by mouth in the morning, at noon, and at bedtime.   losartan (COZAAR) 50 MG tablet Take 50 mg by mouth daily.   MAGNESIUM PO Take by mouth.   Multiple Vitamin (MULTIVITAMIN) tablet Take 1 tablet by mouth daily.   OnabotulinumtoxinA (BOTULINUM TOXIN TYPE A IM) Inject 100 Units into the muscle.   topiramate (TOPAMAX) 25 MG tablet TAKE ONE TABLET BY MOUTH TWICE DAILY IN THE MORNING AND IN THE EVENING   triamcinolone ointment (KENALOG) 0.5 % Apply 1 Application topically 2 (two) times daily.   No facility-administered encounter medications on file as of 02/07/2022.    Allergies (verified) Lisinopril, Penicillins, Adhesive [tape], Atorvastatin, Dilaudid [hydromorphone hcl], and Hydromorphone   History: Past Medical History:  Diagnosis Date   Anxiety    Arthritis    Cardiac conduction disorder 03/30/2012   Overview:  STORY: ETT 03/09/2012 Echo 03/07/2012  normal Dr Einar Gip, bradycardia felt due to glaucoma eye drops   Cervical dystonia    Diverticulosis of colon    DOE (dyspnea on exertion) 03/25/2018   GERD (gastroesophageal reflux disease)    GIST (gastrointestinal stroma tumor), malignant, colon (HCC)    Heart murmur    History of colon polyps 10/24/2008   Hypertension    Major neurocognitive disorder due to Parkinson's disease, possible    Tremors possible parkinsons   Manic disorder, single episode, in full remission ([place]) 12/01/2009   Sixth nerve palsy    Past Surgical History:  Procedure Laterality Date   BILATERAL SALPINGOOPHORECTOMY  09/22/2007   CATARACT EXTRACTION Bilateral    Gastrointestinal Stroma Tumor,  Other  1960   GIST Surgery   ILEOCECETOMY   09/22/2007   OVARIAN CYST REMOVAL Right 1967   SMALL INTESTINE SURGERY  09/22/2007   TOTAL KNEE ARTHROPLASTY Right 10/05/2019   Procedure: RIGHT TOTAL KNEE ARTHROPLASTY;  Surgeon: Meredith Pel, MD;  Location: [place];  Service: Orthopedics;  Laterality: Right;   TOTAL VAGINAL HYSTERECTOMY  1986   Fibroids   Family History  Problem Relation Age of Onset   Congestive Heart Failure Mother    Dementia Mother    Heart disease Father    Diabetes Father    Lung cancer Son    Social History   Socioeconomic History   Marital status: Widowed    Spouse name: Not on file   Number of children: 2   Years of education: Bachelors   Highest education level: Not on file  Occupational History   Occupation: retired    Comment: made wedding dressings  Tobacco Use   Smoking status: Never   Smokeless tobacco: Never  Vaping Use   Vaping Use: Never used  Substance and Sexual Activity   Alcohol use: No   Drug use: No   Sexual activity: Not Currently    Birth control/protection: Post-menopausal  Other Topics Concern   Not on file  Social History Narrative   Lives at home alone.   Right-handed.   No caffeine use.      Diet: Eats anything, like fruits, vegetables, regular diet      Do you drink/ eat things with caffeine? No      Marital status:  Widowed                             What year were you married ? 1965      Do you live in a house, apartment,assistred living, condo, trailer, etc.)? Apartment      Is it one or more stories?  One Story      How many persons live in your home ? 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  No                            Type & how often       ADVANCED DIRECTIVES (Please bring copies)      Do you have a living will? Yes      Do you have a DNR form? Yes  If not,  do you want to discuss one?       Do you have signed POA?HPOA forms?  Yes               If so, please bring to your appointment      FUNCTIONAL STATUS- To be completed by Spouse / child / Staff       Do you have difficulty bathing or dressing yourself ? No   Do you have difficulty preparing food or eating ? No      Do you have difficulty managing your mediation ? No      Do you have difficulty managing your finances ? No      Do you have difficulty affording your medication ? No      Social Determinants of Health   Financial Resource Strain: Not on file  Food Insecurity: Not on file  Transportation Needs: Not on file  Physical Activity: Not on file  Stress: Not on file  Social Connections: Not on file    Tobacco Counseling Counseling given: Not Answered   Clinical Intake:  Pre-visit preparation completed: Yes  Pain : 0-10 Pain Score: 2  Pain Type: Chronic pain Pain Location: Throat Pain Orientation: Mid Pain Descriptors / Indicators: Discomfort, Dull Pain Onset: More than a month ago Pain Frequency: Several days a week Pain Relieving Factors: pending GI consulation Effect of Pain on Daily Activities: none  Pain Relieving Factors: pending GI consulation  BMI - recorded: 20.73 Nutritional Status: BMI of 19-24  Normal Nutritional Risks: Unintentional weight loss (GI- swaloowing issue, [place].) Diabetes: No  How often do you need to have someone help you when you read instructions, pamphlets, or other written materials from your doctor or pharmacy?: 2 - Rarely What is the last grade level you completed in school?: college  Diabetic?no  Interpreter Needed?: No  Information entered by :: Dhruv Christina Bretta Bang NP   Activities of Daily Living     No data to display          Patient Care Team: Adah Stoneberg X, NP as PCP - General (Internal Medicine) Marti Sleigh, MD as Consulting Physician (Gynecologic Oncology) Ladell Pier, MD as Consulting Physician  (Oncology) Richmond Campbell, MD as Consulting Physician (Gastroenterology) Clent Jacks, MD as Consulting Physician (Ophthalmology) Sabra Heck Precious Haws, MD as Referring Physician (Neurology) Tat, Eustace Quail, DO as Consulting Physician (Neurology)  Indicate any recent Medical Services you may have received from other than [facility] providers in the past year (date may be approximate).     Assessment:   This is a routine wellness examination for Janette.  Hearing/Vision screen No results found.  Dietary issues and exercise activities discussed:     Goals Addressed             This Visit's Progress    Maintain Mobility and Function       Evidence-based guidance:  Acknowledge and validate impact of pain, loss of strength and potential disfigurement (hand osteoarthritis) on mental health and daily life, such as social isolation, anxiety, depression, impaired sexual relationship and   injury from falls.  Anticipate referral to physical or occupational therapy for assessment, therapeutic exercise and recommendation for adaptive equipment or assistive devices; encourage participation.  Assess impact on ability to perform activities of daily living, as well as engage in sports and leisure events or requirements of work or school.  Provide anticipatory guidance and reassurance about the benefit of exercise to maintain function; acknowledge and normalize fear  that exercise may worsen symptoms.  Encourage regular exercise, at least 10 minutes at a time for 45 minutes per week; consider yoga, water exercise and proprioceptive exercises; encourage use of wearable activity tracker to increase motivation and adherence.  Encourage maintenance or resumption of daily activities, including employment, as pain allows and with minimal exposure to trauma.  Assist patient to advocate for adaptations to the work environment.  Consider level of pain and function, gender, age, lifestyle, patient preference, quality  of life, readiness and ?ocapacity to benefit? when recommending patients for orthopaedic surgery consultation.  Explore strategies, such as changes to medication regimen or activity that enables patient to anticipate and manage flare-ups that increase deconditioning and disability.  Explore patient preferences; encourage exposure to a broader range of activities that have been avoided for fear of experiencing pain.  Identify barriers to participation in therapy or exercise, such as pain with activity, anticipated or imagined pain.  Monitor postoperative joint replacement or any preexisting joint replacement for ongoing pain and loss of function; provide social support and encouragement throughout recovery.   Notes:        Depression Screen    12/28/2020    5:04 PM 09/14/2020    4:20 PM 05/04/2020    3:26 PM  PHQ 2/9 Scores  PHQ - 2 Score 0 0 0    Fall Risk    02/06/2022    8:50 AM 01/16/2022    2:37 PM 11/21/2021    3:38 PM 10/10/2021    2:40 PM 10/05/2021    9:10 AM  Fall Risk   Falls in the past year? '1 1 1 '$ 0 0  Number falls in past yr: 1 0 0 0 0  Injury with Fall? 0 1 1 0 0  Risk for fall due to : History of fall(s);Impaired balance/gait History of fall(s) History of fall(s) No Fall Risks   Follow up Falls evaluation completed Falls evaluation completed       [place]:  Any stairs in or around the home? Yes  If so, are there any without handrails? No  Home free of loose throw rugs in walkways, pet beds, electrical cords, etc? Yes  Adequate lighting in your home to reduce risk of falls? Yes   ASSISTIVE DEVICES UTILIZED TO PREVENT FALLS:  Life alert? No  Use of a cane, walker or w/c? No  Grab bars in the bathroom? Yes  Shower chair or bench in shower? No  Elevated toilet seat or a handicapped toilet? Yes   TIMED UP AND GO:  Was the test performed? No . No problems  Gait steady and fast without use of assistive device  Cognitive  Function:    02/07/2022    1:21 PM 09/14/2020    4:20 PM  MMSE - Mini Mental State Exam  Orientation to time 5 5  Orientation to Place 5 5  Registration 3 3  Attention/ Calculation 5 5  Recall 3 3  Language- name 2 objects 2 2  Language- repeat 1 1  Language- follow 3 step command 3 3  Language- read & follow direction 1 1  Write a sentence 1 1  Copy design 1 1  Total score 30 30        Immunizations Immunization History  Administered Date(s) Administered   Fluad Quad(high Dose 65+) 11/03/2017, 10/30/2020   Influenza Split 10/24/2008, 09/26/2009, 10/08/2011, 10/05/2012   Influenza, High Dose Seasonal PF 11/02/2013, 11/02/2013, 11/03/2017, 10/26/2018, 10/27/2019   Influenza,  Quadrivalent, Recombinant, Inj, Pf 10/11/2016   Influenza,inj,Quad PF,6+ Mos 11/01/2014, 10/11/2015, 10/11/2016   Influenza-Unspecified 10/24/2008, 09/26/2009, 10/08/2011, 10/05/2012, 11/02/2013, 10/11/2016, 11/05/2021   Moderna SARS-COV2 Booster Vaccination 06/13/2020   Moderna Sars-Covid-2 Vaccination 01/18/2019, 02/15/2019, 11/23/2019   Pfizer Covid-19 Vaccine Bivalent Booster 39yr & up 11/15/2021   Pneumococcal Conjugate-13 08/20/2011   Pneumococcal Polysaccharide-23 01/14/2005, 06/29/2014   Pneumococcal-Unspecified 01/14/2005   Tdap 08/20/2011   Zoster Recombinat (Shingrix) 11/09/2018, 12/01/2018, 03/01/2019   Zoster, Live 10/08/2011    TDAP status: Due, Education has been provided regarding the importance of this vaccine. Advised may receive this vaccine at local pharmacy or Health Dept. Aware to provide a copy of the vaccination record if obtained from local pharmacy or Health Dept. Verbalized acceptance and understanding.  Flu Vaccine status: Up to date  Pneumococcal vaccine status: Up to date  Covid-19 vaccine status: Information provided on how to obtain vaccines.   Qualifies for Shingles Vaccine? Yes   Zostavax completed Yes   Shingrix Completed?: Yes  Screening Tests Health  Maintenance  Topic Date Due   DTaP/Tdap/Td (2 - Td or Tdap) 08/19/2021   COVID-19 Vaccine (5 - 2023-24 season) 04/15/2022 (Originally 01/10/2022)   Medicare Annual Wellness (AWV)  02/08/2023   Pneumonia Vaccine 82 Years old  Completed   INFLUENZA VACCINE  Completed   DEXA SCAN  Completed   Zoster Vaccines- Shingrix  Completed   HPV VACCINES  Aged Out    Health Maintenance  Health Maintenance Due  Topic Date Due   DTaP/Tdap/Td (2 - Td or Tdap) 08/19/2021    Colorectal cancer screening: No longer required.   Mammogram status: No longer required due to aged out.  Bone Density status: Ordered 02/07/22. Pt provided with contact info and advised to call to schedule appt.  Lung Cancer Screening: (Low Dose CT Chest recommended if Age 82-80years, 30 pack-year currently smoking OR have quit w/in 15years.) does not qualify.     Additional Screening:  Hepatitis C Screening: does not qualify;   Vision Screening: Recommended annual ophthalmology exams for early detection of glaucoma and other disorders of the eye. Is the patient up to date with their annual eye exam?  yes Who is the provider or what is the name of the office in which the patient attends annual eye exams? [facility] If pt is not established with a provider, would they like to be referred to a provider to establish care? No .   Dental Screening: Recommended annual dental exams for proper oral hygiene  Community Resource Referral / Chronic Care Management: CRR required this visit?  No   CCM required this visit?  No      Plan:    DEXA ordered.  I have personally reviewed and noted the following in the patient's chart:   Medical and social history Use of alcohol, tobacco or illicit drugs  Current medications and supplements including opioid prescriptions. Patient is not currently taking opioid prescriptions. Functional ability and status Nutritional status Physical activity Advanced directives List of  other physicians Hospitalizations, surgeries, and ER visits in previous 12 months Vitals Screenings to include cognitive, depression, and falls Referrals and appointments  In addition, I have reviewed and discussed with patient certain preventive protocols, quality metrics, and best practice recommendations. A written personalized care plan for preventive services as well as general preventive health recommendations were provided to patient.     Manraj Yeo X Eldred Lievanos, NP   02/07/2022

## 2022-02-07 NOTE — Telephone Encounter (Signed)
Medication last filled 12/17/2021.  Medication pended and sent to [facility] Mast, NP

## 2022-02-08 DIAGNOSIS — R41841 Cognitive communication deficit: Secondary | ICD-10-CM | POA: Diagnosis not present

## 2022-02-11 DIAGNOSIS — R41841 Cognitive communication deficit: Secondary | ICD-10-CM | POA: Diagnosis not present

## 2022-02-12 DIAGNOSIS — R41841 Cognitive communication deficit: Secondary | ICD-10-CM | POA: Diagnosis not present

## 2022-02-14 ENCOUNTER — Encounter: Payer: Self-pay | Admitting: Nurse Practitioner

## 2022-02-14 ENCOUNTER — Ambulatory Visit: Payer: Medicare PPO | Admitting: Nurse Practitioner

## 2022-02-14 VITALS — BP 130/82 | HR 50 | Ht 61.0 in | Wt 118.0 lb

## 2022-02-14 DIAGNOSIS — R1310 Dysphagia, unspecified: Secondary | ICD-10-CM

## 2022-02-14 NOTE — Progress Notes (Signed)
Assessment    Patient profile:  Chereese NORMAGENE HARVIE is a 82 y.o. year old female , new to the practice with a past medical history of cervical dystonia,  bipolar disorder, remote GIST of ileum s/p remote ileocecectomy, adenomatous colon polyps, SBP, tinnitus  See PMH / PSH for additional history.  Patient referred by PCP for dysphagia  # 82 yo female with solid food dysphagia over last several months with transient delay of barium tablet in mid esophagus seen on MBSS. Consider esophageal dysmotility or possibly stenosis. Maybe she has persistent candida for which she was recently treated with diflucan  # ? History of GERD. Has taken a PPI for years but she doesn't know why. Dr. Liliane Channel old endoscopy reports were reviewed. Endoscopically she appeared to have Barrett's but esophageal biopsies were negative.   #  History of GIST of ileum s/p right ileocecectomy in 2009.  No polyps / cancers on subsequent colonoscopies including the last one in 2016 by Dr. Earlean Shawl.   # History of adenomatous colon polyps in 2010.  No recurrent polyps or cancers on last colonoscopy in 2016 by Dr. Earlean Shawl  # ? Parkinson's.  From reading Neurology's notes it doesn't seem to be definite diagnosis. DaTscan equivocal.  Levodopa challenge test in January, 2023 was negative. Followed by Dr. Carles Collet.   # Cervical dystonia.  Gets botox injections.     # Severe degenerative cervical changes  # Bipolar disorder. On Lithium  Plan:    Discussed getting an esophagram to better evaluate the esophagus  or just proceed with an EGD. EGD seems reasonable.  The risks and benefits of EGD with possible biopsies were discussed with the patient who agrees to proceed.   HPI:    Chief Complaint:  problems swallowing food  Patient is referred by PCP for "oropharyngeal dysphagia and Barrett's esophagus with dysplasia".   Details are little difficult to obtain. Patient tells me she had a sore throat for a very long time. At some point ,  according to notes by PCP she she had a FEES which showed white plaques in pharynx . She completed a course of Diflucan  a couple of weeks ago but still has a sore throat.   Additionally for the last several months or so she has had difficulty swallowing, mainly meat. She has to cut meat up into small pieces or it will get stuck in her throat . Everything else seems to go down okay. She has lost a lot of weight over last 8 months but she attributes that to Topamax.   She had a MBSS earlier this month.  The study showed that she had functional oropharyngeal swallowing for her age.  However, she did have mild retention of solid/pudding at the vallecular region.   Barium tablet held up transiently in the mid esophagus without patient sensation.  Several extra boluses of liquid appeared to push the tablet into the stomach  Patient says she has been taking twice daily Nexium for years but doesn't really know why.  She denies any history of GERD symptoms.  Patient used to be followed by Dr. Earlean Shawl.  Years back, EGDs suggested Barrett's esophagus but biopsies were negative.   Marlowe Kays has a history of a GIST tumor involving the ileum. She had a right ileocecectomy in 2009   Previous Labs / Imaging::    Latest Ref Rng & Units 04/09/2021    5:16 PM 01/26/2020   12:00 AM 01/13/2020    5:19 AM  CBC  WBC 3.8 - 10.8 Thousand/uL 6.5  7.1     8.3   Hemoglobin 11.7 - 15.5 g/dL 12.8  11.7     12.2   Hematocrit 35.0 - 45.0 % 38.6  35     38.9   Platelets 140 - 400 Thousand/uL 306  294     272      This result is from an external source.    Lab Results  Component Value Date   LIPASE 22 09/18/2013      Latest Ref Rng & Units 04/09/2021    5:16 PM 05/11/2020    7:00 AM 01/26/2020   12:00 AM  CMP  Glucose 65 - 99 mg/dL 93  92    BUN 7 - 25 mg/dL '21  18  19      '$ Creatinine 0.60 - 0.95 mg/dL 0.99  0.93  0.9      Sodium 135 - 146 mmol/L 141  139  139      Potassium 3.5 - 5.3 mmol/L 4.7  4.9  5.1       Chloride 98 - 110 mmol/L 109  107  109      CO2 20 - 32 mmol/L '24  24  25      '$ Calcium 8.6 - 10.4 mg/dL 10.7  10.3  10.0      Total Protein 6.1 - 8.1 g/dL 6.6  6.5    Total Bilirubin 0.2 - 1.2 mg/dL 0.6  0.5    Alkaline Phos 25 - 125   81      AST 10 - 35 U/L '20  19  11      '$ ALT 6 - 29 U/L '11  13  12         '$ This result is from an external source.    Previous GI Evaluation  EGD and colonoscopy by Dr. Cletis Athens in 2007 done for anemia and heme positive stool.  Both exams were normal.   EGD and colonoscopy November 2009 by Dr. Earlean Shawl done for recent ileal GIST tumor.  EGD was normal except for a large hiatal hernia.  Colonoscopy showed status post enterocolostomy  with end to side anastomosis at the level of the hepatic flexure.  Multiple colon polyps ranging from 7-9 mm in size Pathology: Transverse colon polyp was a tubular adenoma.  No high-grade dysplasia.  Left colon polyp was a tubular adenoma, no high-grade dysplasia.  EGD July 2010 done to evaluate recent diagnosis of GIST tumor.  Procedure done by Dr. Earlean Shawl.  Findings included Barrett's esophagus 37 to 39 cm.  Gastritis.  Gastric biopsies show active mucosal inflammation and reactive epithelial changes.  Fundus biopsies show chronic gastritis.  Esophageal biopsies at 39 cm showed mild chronic inflammation.  No intestinal metaplasia, dysplasia or malignancy identified.  Esophageal biopsies at 37 cm showed benign squamous mucosa with mild chronic inflammation.  No intestinal metaplasia, dysplasia or malignancy  Colonoscopy with advancement of scope approximately 5 cm into the small bowel Sept 2016 by Dr. Earlean Shawl -No colorectal neoplasia.  Status post right colectomy.  Ileocolonic anastomosis, widely patent.   Imaging:  DG SWALLOW FUNC OP MEDICARE SPEECH PATH Table formatting from the original result was not included. Images from the original result were not included. Objective Swallowing Evaluation: Type of Study:  MBS-Modified Barium  Swallow Study   Patient Details  Name: MARRIAH SANDERLIN MRN: [id number] Date of Birth: 09-04-1940  Today's Date: 01/31/2022 Time: SLP Start Time (ACUTE ONLY): 1301 -SLP Stop  Time (ACUTE ONLY): 1349  SLP Time Calculation (min) (ACUTE ONLY): 48 min  Past Medical History:  Past Medical History:  Diagnosis Date   Anxiety    Arthritis    Cardiac conduction disorder 03/30/2012   Overview:  STORY: ETT 03/09/2012 Echo 03/07/2012 normal Dr Einar Gip,  bradycardia felt due to glaucoma eye drops   Cervical dystonia    Diverticulosis of colon    GERD (gastroesophageal reflux disease)    GIST (gastrointestinal stroma tumor), malignant, colon (HCC)    Heart murmur    History of colon polyps 10/24/2008   Hypertension    Major neurocognitive disorder due to Parkinson's disease, possible    Tremors possible parkinsons   Manic disorder, single episode, in full remission ([place]) 12/01/2009   Sixth nerve palsy    Past Surgical History:  Past Surgical History:  Procedure Laterality Date   BILATERAL SALPINGOOPHORECTOMY  09/22/2007   CATARACT EXTRACTION Bilateral    Gastrointestinal Stroma Tumor,  Other  1960   GIST Surgery   ILEOCECETOMY  09/22/2007   OVARIAN CYST REMOVAL Right 1967   SMALL INTESTINE SURGERY  09/22/2007   TOTAL KNEE ARTHROPLASTY Right 10/05/2019   Procedure: RIGHT TOTAL KNEE ARTHROPLASTY;  Surgeon: Meredith Pel,  MD;  Location: [place];  Service: Orthopedics;  Laterality: Right;   TOTAL VAGINAL HYSTERECTOMY  1986   Fibroids   HPI: 82 yo female with parkinsons disease referred for MBS.  Pt has h/o  anxiety, colon polyps, cervical dystonia, GERD, GIST, 6th nerve palsy,  major disorder -single episode in remission, Bipolar d/o - ? lithium  induced tremor - was given Primidone in the past, cardiac conduction  disorder.  She has received Botox for cervical dystonia but denies  improvement in her symptoms and states "I think I am going to stop that".    Reports anterior nasal drainage stating she is like a "dog".  Pt has  previously taken Levadopa in January 2023 for potential parkinsonism and  s/o DaTscan - equivocal- per neuro note.      She stays at [facility].      Pt reports primary symptoms include early satiety, pain in throat,  sensing food sticking in throat *pointing to distal region*  and admits to  weight loss.  She attributes weight loss to her early satiety, sore throat  and dysphagia.  She had previously used thickener in her drinks and  reported improvement in swallowing because it was "smoother" - gleaned  this helped with oral control.  Denies requiring heimlich manuever, having  recurrent pneumonias.  Reports now she is able to manage swallowing thin  liquids without difficulties and takes medications with thin without  difficulties as well.  Of note, at times pt noted to have delayed responses to questions, ? If  delayed processing - if cognitive concerns are present, she may benefit  from SLP at [facility] to maximize her function.     Subjective: pt reports issues with sore throat - gradual onset noted in  the last 3 months   Recommendations for follow up therapy are one component of a  multi-disciplinary discharge planning process, led by the attending  physician.  Recommendations may be updated based on patient status,  additional functional criteria and insurance authorization.  Assessment / Plan / Recommendation    01/31/2022    1:00 PM  Clinical Impressions  Clinical Impression Pt presents with functional oropharngeal swallow for  her age.  No aspiration or penetration observed with all barium consumed  including thin, nectar, pudding, cracker and tablet.    She does have mild retention of solid/pudding at vallecular region with  sensation with solids. Reflexive dry swallow cleared retention and pt  reports she often drinks liquids to assure clear.  Pt at one time sensed  retention on left side  of pharynx when she was clear.   After barium tablet swallowed with thin, it appeared to transiently stall  in mid-esophagus - without pt sensation.  Several extra boluses of liquids  appeared to transit tablet into stomach.    MBS not designed to evaluate below UES but advised pt drink plenty of  liquid during her meals.  SLP reviewed every MBS flouro loop with the pt  following the exam as she was very interested.    Advised she maintain strength of expectoration if needed in the future to  clear vallecular retention that she can not swallow to clear.   Also  recommended she inform her MD of her throat pain and nasal drainage - as  she reports pain to be her primary symptom.   Thanks for this referral.  SLP Visit Diagnosis Dysphagia, pharyngeal phase (R13.13)  Impact on safety and function Mild aspiration risk          No data to display               No data to display           01/31/2022    1:00 PM  Diet Recommendations  SLP Diet Recommendations Regular solids;Thin liquid  Liquid Administration via Cup;Straw  Medication Administration Whole meds with liquid  Compensations Slow rate;Small sips/bites;Other (Comment)  Postural Changes Remain semi-upright after after feeds/meals  (Comment);Seated upright at 90 degrees          No data to display             No data to display              01/31/2022    1:00 PM  Oral Phase  Oral Phase [facility]       01/31/2022    1:00 PM  Pharyngeal Phase  Pharyngeal Phase Impaired;WFL  Pharyngeal- Nectar Cup [facility]  Pharyngeal Material does not enter airway  Pharyngeal- Thin Cup [facility];Pharyngeal residue - pyriform;Pharyngeal residue  - valleculae  Pharyngeal Material does not enter airway  Pharyngeal- Thin Straw WFL  Pharyngeal Material does not enter airway  Pharyngeal- Puree Pharyngeal residue - valleculae;WFL  Pharyngeal Material does not enter airway  Pharyngeal- Mechanical Soft WFL;Pharyngeal residue - valleculae   Pharyngeal Material does not enter airway  Pharyngeal- Pill WFL  Pharyngeal Comment When pt instructed to drink fast - she was observed to  piecemeal with excellent consistent airway closure.       01/31/2022    1:00 PM  Cervical Esophageal Phase   Cervical Esophageal Phase WFL   Kathleen Lime, MS [facility] SLP Acute Rehab Services Office [phone] Pager [phone]  Macario Golds 01/31/2022, 3:01 PM    CLINICAL DATA:  82 year old female with complaint of dysphagia. Has used thickened liquids in the past.  EXAM: MODIFIED BARIUM SWALLOW  TECHNIQUE: Different consistencies of barium were administered orally to the patient by the Speech Pathologist. Imaging of the pharynx was performed in the lateral projection. Brynda Greathouse PA-C was present in the fluoroscopy room during this study, which was supervised and interpreted by Logan Bores, MD.  FLUOROSCOPY: Radiation Exposure Index (as provided by the fluoroscopic device): 12.6 mGy [facility]  COMPARISON:  None Available.  FINDINGS: Vestibular  Penetration:  None seen.  Aspiration:  None seen.  Other:  None.  IMPRESSION: No penetration or aspiration.  Please refer to the Speech Pathologist's report for complete details and recommendations.  Electronically Signed   By: Logan Bores M.D.   On: 01/31/2022 14:05    Past Medical History:  Diagnosis Date   Anxiety    Arthritis    Cardiac conduction disorder 03/30/2012   Overview:  STORY: ETT 03/09/2012 Echo 03/07/2012 normal Dr Einar Gip, bradycardia felt due to glaucoma eye drops   Cervical dystonia    Diverticulosis of colon    DOE (dyspnea on exertion) 03/25/2018   GERD (gastroesophageal reflux disease)    GIST (gastrointestinal stroma tumor), malignant, colon (HCC)    Heart murmur    History of colon polyps 10/24/2008   Hypertension    Major neurocognitive disorder due to Parkinson's disease, possible    Tremors possible parkinsons   Manic disorder, single  episode, in full remission ([place]) 12/01/2009   Sixth nerve palsy    Past Surgical History:  Procedure Laterality Date   BILATERAL SALPINGOOPHORECTOMY  09/22/2007   CATARACT EXTRACTION Bilateral    Gastrointestinal Stroma Tumor,  Other  1960   GIST Surgery   ILEOCECETOMY  09/22/2007   OVARIAN CYST REMOVAL Right 1967   SMALL INTESTINE SURGERY  09/22/2007   TOTAL KNEE ARTHROPLASTY Right 10/05/2019   Procedure: RIGHT TOTAL KNEE ARTHROPLASTY;  Surgeon: Meredith Pel, MD;  Location: [place];  Service: Orthopedics;  Laterality: Right;   TOTAL VAGINAL HYSTERECTOMY  1986   Fibroids   Family History  Problem Relation Age of Onset   Congestive Heart Failure Mother    Dementia Mother    Heart disease Father    Diabetes Father    Lung cancer Son    Social History   Tobacco Use   Smoking status: Never   Smokeless tobacco: Never  Vaping Use   Vaping Use: Never used  Substance Use Topics   Alcohol use: No   Drug use: No   Current Outpatient Medications  Medication Sig Dispense Refill   ALPRAZolam (XANAX) 0.5 MG tablet TAKE ONE TABLET BY MOUTH TWICE DAILY AS NEEDED FOR ANXIETY 60 tablet 0   Artificial Saliva (BIOTENE DRY MOUTH) LOZG Use as directed 1 tablet in the mouth or throat 4 (four) times daily as needed. 30 lozenge 1   aspirin 81 MG chewable tablet Chew 81 mg by mouth daily.     BETIMOL 0.25 % ophthalmic solution Place 1-2 drops into both eyes 2 (two) times daily. 10 mL 3   clindamycin (CLEOCIN) 150 MG capsule Take '600mg'$  1 hour prior to dental procedure 12 capsule 0   divalproex (DEPAKOTE) 125 MG DR tablet Take 1 tablet (125 mg total) by mouth 2 (two) times daily. 60 tablet 2   esomeprazole (NEXIUM) 20 MG capsule Take 1 capsule (20 mg total) by mouth 2 (two) times daily. 180 capsule 0   fluconazole (DIFLUCAN) 150 MG tablet Take 1 tablet (150 mg total) by mouth daily. 7 tablet 0   latanoprost (XALATAN) 0.005 % ophthalmic solution 1 drop at bedtime.     lithium carbonate 150 MG  capsule Take 1 capsule (150 mg total) by mouth in the morning, at noon, and at bedtime. 270 capsule 1   losartan (COZAAR) 50 MG tablet Take 50 mg by mouth daily.     MAGNESIUM PO Take by mouth.     Multiple Vitamin (MULTIVITAMIN)  tablet Take 1 tablet by mouth daily.     OnabotulinumtoxinA (BOTULINUM TOXIN TYPE A IM) Inject 100 Units into the muscle.     topiramate (TOPAMAX) 25 MG tablet TAKE ONE TABLET BY MOUTH TWICE DAILY IN THE MORNING AND IN THE EVENING 180 tablet 2   triamcinolone ointment (KENALOG) 0.5 % Apply 1 Application topically 2 (two) times daily.     No current facility-administered medications for this visit.   Allergies  Allergen Reactions   Lisinopril Cough   Penicillins Itching    50 years ago   Adhesive [Tape] Itching   Atorvastatin Itching   Dilaudid [Hydromorphone Hcl] Itching   Hydromorphone Itching     Review of Systems: Positive for anxiety, arthritis, vision changes, depression, fatigue, sore throat and headache.  All other systems reviewed and negative except where noted in HPI.   Wt Readings from Last 3 Encounters:  02/07/22 117 lb (53.1 kg)  01/16/22 117 lb 12.8 oz (53.4 kg)  01/03/22 118 lb (53.5 kg)    Physical Exam   BP 130/82   Pulse (!) 50   Ht '5\' 1"'$  (1.549 m)   Wt 118 lb (53.5 kg)   SpO2 98%   BMI 22.30 kg/m  Constitutional:  Pleasant, generally well appearing female in no acute distress. Psychiatric:  Normal mood and affect. Behavior is normal. EENT: Pupils normal.  Conjunctivae are normal. No scleral icterus. Neck supple.  Cardiovascular: Normal rate, regular rhythm.  Pulmonary/chest: Effort normal and breath sounds normal. No wheezing, rales or rhonchi. Abdominal: Soft, nondistended, nontender. Bowel sounds active throughout. There are no masses palpable. No hepatomegaly. Neurological: Alert and oriented to person place and time. Skin: Skin is warm and dry. No rashes noted.  Tye Savoy, NP  02/14/2022, 9:47 AM  Cc:  Referring  Provider Teodoro Spray, MD

## 2022-02-14 NOTE — Patient Instructions (Signed)
You have been scheduled for an endoscopy. Please follow written instructions given to you at your visit today. If you use inhalers (even only as needed), please bring them with you on the day of your procedure.    _______________________________________________________  If your blood pressure at your visit was 140/90 or greater, please contact your primary care physician to follow up on this.  _______________________________________________________  If you are age 82 or older, your body mass index should be between 23-30. Your Body mass index is 22.3 kg/m. If this is out of the aforementioned range listed, please consider follow up with your Primary Care Provider.  __________________________________________________________  The [place] [facility] providers would like to encourage you to use [facility] to communicate with providers for non-urgent requests or questions.  Due to long hold times on the telephone, sending your provider a message by [facility] may be a faster and more efficient way to get a response.  Please allow 48 business hours for a response.  Please remember that this is for non-urgent requests.   Due to recent changes in healthcare laws, you may see the results of your imaging and laboratory studies on MyChart before your provider has had a chance to review them.  We understand that in some cases there may be results that are confusing or concerning to you. Not all laboratory results come back in the same time frame and the provider may be waiting for multiple results in order to interpret others.  Please give [place] 48 hours in order for your provider to thoroughly review all the results before contacting the office for clarification of your results.    Thank you for choosing me and [facility].  Tye Savoy, NP.

## 2022-02-15 DIAGNOSIS — R41841 Cognitive communication deficit: Secondary | ICD-10-CM | POA: Diagnosis not present

## 2022-02-15 NOTE — Progress Notes (Signed)
____________________________________________________________  Attending physician addendum:  Thank you for sending this case to me. I have reviewed the entire note and agree with the plan.  Thank you for discussing the case with me this morning and agreed to see her in the office previously in the near future to confirm good neck range of motion to allow her procedure to be done in the [place].  If limited ROM, especially flexion extension, procedure would need to be done in the hospital outpatient endoscopy department.  Have me or supervising physician look at her today she comes in to see you again and we will make a decision.  Wilfrid Lund, MD  ____________________________________________________________

## 2022-02-18 DIAGNOSIS — R41841 Cognitive communication deficit: Secondary | ICD-10-CM | POA: Diagnosis not present

## 2022-02-19 DIAGNOSIS — R41841 Cognitive communication deficit: Secondary | ICD-10-CM | POA: Diagnosis not present

## 2022-02-20 ENCOUNTER — Other Ambulatory Visit: Payer: Self-pay | Admitting: Nurse Practitioner

## 2022-02-20 ENCOUNTER — Other Ambulatory Visit: Payer: Self-pay | Admitting: Neurology

## 2022-02-20 DIAGNOSIS — G243 Spasmodic torticollis: Secondary | ICD-10-CM

## 2022-02-21 ENCOUNTER — Ambulatory Visit: Payer: Medicare PPO | Admitting: Nurse Practitioner

## 2022-02-21 ENCOUNTER — Other Ambulatory Visit: Payer: Self-pay

## 2022-02-22 ENCOUNTER — Telehealth: Payer: Self-pay | Admitting: Neurology

## 2022-02-22 NOTE — Telephone Encounter (Signed)
Called patient and gave Dr. Arturo Morton recommendations

## 2022-02-22 NOTE — Telephone Encounter (Signed)
Pt called in stating she was diagnosed with a "fluttery" eye after an infection she had a while back and the eye doctor wants to inject her with botox for it. She would like to know if Dr. Carles Collet might recommend this? She is not sure what she thinks about.

## 2022-02-25 ENCOUNTER — Telehealth: Payer: Self-pay

## 2022-02-25 ENCOUNTER — Other Ambulatory Visit: Payer: Self-pay

## 2022-02-25 DIAGNOSIS — R41841 Cognitive communication deficit: Secondary | ICD-10-CM | POA: Diagnosis not present

## 2022-02-25 DIAGNOSIS — R1310 Dysphagia, unspecified: Secondary | ICD-10-CM

## 2022-02-25 NOTE — Telephone Encounter (Signed)
Pt scheduled for EGD with Dr. Loletha Carrow on 03/25/22. Ambulatory referral placed and instructions sent to pt. Pt verbalized understanding and had no other concerns at end of call.

## 2022-02-25 NOTE — Telephone Encounter (Signed)
-----   Message from Tracey Craze, NP sent at 02/21/2022  1:21 PM EST ----- Mickel Baas,  Would you mind rescheduling this patient's EGD from the [facility] to [facility].  Dr. Loletha Carrow says that he has openings at [place] on [address].  The patient is already expecting the change.  Thanks

## 2022-02-26 DIAGNOSIS — R41841 Cognitive communication deficit: Secondary | ICD-10-CM | POA: Diagnosis not present

## 2022-02-27 ENCOUNTER — Encounter: Payer: Medicare PPO | Admitting: Family Medicine

## 2022-02-27 DIAGNOSIS — M47812 Spondylosis without myelopathy or radiculopathy, cervical region: Secondary | ICD-10-CM | POA: Diagnosis not present

## 2022-03-04 DIAGNOSIS — R41841 Cognitive communication deficit: Secondary | ICD-10-CM | POA: Diagnosis not present

## 2022-03-05 DIAGNOSIS — R41841 Cognitive communication deficit: Secondary | ICD-10-CM | POA: Diagnosis not present

## 2022-03-08 DIAGNOSIS — R41841 Cognitive communication deficit: Secondary | ICD-10-CM | POA: Diagnosis not present

## 2022-03-11 DIAGNOSIS — R41841 Cognitive communication deficit: Secondary | ICD-10-CM | POA: Diagnosis not present

## 2022-03-12 DIAGNOSIS — R41841 Cognitive communication deficit: Secondary | ICD-10-CM | POA: Diagnosis not present

## 2022-03-13 ENCOUNTER — Non-Acute Institutional Stay (INDEPENDENT_AMBULATORY_CARE_PROVIDER_SITE_OTHER): Payer: Medicare PPO | Admitting: Family Medicine

## 2022-03-13 ENCOUNTER — Encounter: Payer: Self-pay | Admitting: Family Medicine

## 2022-03-13 VITALS — BP 118/62 | HR 46 | Temp 97.3°F | Ht 61.0 in | Wt 121.8 lb

## 2022-03-13 DIAGNOSIS — F304 Manic episode in full remission: Secondary | ICD-10-CM

## 2022-03-13 DIAGNOSIS — G243 Spasmodic torticollis: Secondary | ICD-10-CM | POA: Diagnosis not present

## 2022-03-13 DIAGNOSIS — R4189 Other symptoms and signs involving cognitive functions and awareness: Secondary | ICD-10-CM | POA: Diagnosis not present

## 2022-03-13 DIAGNOSIS — F411 Generalized anxiety disorder: Secondary | ICD-10-CM | POA: Diagnosis not present

## 2022-03-13 MED ORDER — ESCITALOPRAM OXALATE 5 MG PO TABS: 5.0000 mg | ORAL_TABLET | Freq: Every day | ORAL | 2 refills | Status: DC

## 2022-03-13 NOTE — Progress Notes (Signed)
Provider:  Alain Honey, MD  Careteam: Patient Care Team: Wardell Honour, MD as PCP - General (Family Medicine) Marti Sleigh, MD as Consulting Physician (Gynecologic Oncology) Ladell Pier, MD as Consulting Physician (Oncology) Richmond Campbell, MD as Consulting Physician (Gastroenterology) Clent Jacks, MD as Consulting Physician (Ophthalmology) Sabra Heck Precious Haws, MD as Referring Physician (Neurology) Tat, Eustace Quail, DO as Consulting Physician (Neurology)  PLACE OF SERVICE:  [facility]  Advanced Directive information    Allergies  Allergen Reactions   Lisinopril Cough   Penicillins Itching    50 years ago   Adhesive [Tape] Itching   Atorvastatin Itching   Dilaudid [Hydromorphone Hcl] Itching   Hydromorphone Itching    Chief Complaint  Patient presents with   Medical Management of Chronic Issues    Patient presents today for a month follow-up     HPI: Patient is a 82 y.o. female .  Had stopped and decreased some medicines at last visit namely Depakote and lithium.  Still feels tired. Still has lots of symptoms from sore throat to rhinorrhea to neck pain shoulder pain.  Still feels tired.  Endorses not sleeping well.  She has yet to have endoscopy.  This has been postponed to do at the hospital since she has issues with her neck.  Review of Systems:  Review of Systems  Constitutional:  Positive for malaise/fatigue.  HENT:  Positive for sore throat.   Respiratory: Negative.    Cardiovascular: Negative.   Genitourinary: Negative.   Musculoskeletal:  Positive for myalgias and neck pain.  Neurological: Negative.   Psychiatric/Behavioral:  Positive for depression.     Past Medical History:  Diagnosis Date   Anemia    Anxiety    Arthritis    Cardiac conduction disorder 03/30/2012   Overview:  STORY: ETT 03/09/2012 Echo 03/07/2012 normal Dr Einar Gip, bradycardia felt due to glaucoma eye drops   Cervical dystonia    Diverticulosis of colon    DOE  (dyspnea on exertion) 03/25/2018   Gastroesophageal cancer (HCC)    GERD (gastroesophageal reflux disease)    GIST (gastrointestinal stroma tumor), malignant, colon (HCC)    Glaucoma    Heart murmur    History of colon polyps 10/24/2008   Hypertension    Major neurocognitive disorder due to Parkinson's disease, possible    Tremors possible parkinsons   Manic disorder, single episode, in full remission ([place]) 12/01/2009   Sixth nerve palsy    Past Surgical History:  Procedure Laterality Date   BILATERAL SALPINGOOPHORECTOMY  09/22/2007   CATARACT EXTRACTION Bilateral    Gastrointestinal Stroma Tumor,  Other  1960   GIST Surgery   ILEOCECETOMY  09/22/2007   OVARIAN CYST REMOVAL Right 1967   SMALL INTESTINE SURGERY  09/22/2007   TOTAL KNEE ARTHROPLASTY Right 10/05/2019   Procedure: RIGHT TOTAL KNEE ARTHROPLASTY;  Surgeon: Meredith Pel, MD;  Location: [facility];  Service: Orthopedics;  Laterality: Right;   TOTAL VAGINAL HYSTERECTOMY  1986   Fibroids   Social History:   reports that she has never smoked. She has never used smokeless tobacco. She reports that she does not drink alcohol and does not use drugs.  Family History  Problem Relation Age of Onset   Congestive Heart Failure Mother    Dementia Mother    Heart disease Father    Diabetes Father    Lung cancer Son    Liver disease Neg Hx    Esophageal cancer Neg Hx    Colon cancer Neg  Hx     Medications: Patient's Medications  New Prescriptions   No medications on file  Previous Medications   ALPRAZOLAM (XANAX) 0.5 MG TABLET    TAKE ONE TABLET BY MOUTH TWICE DAILY AS NEEDED FOR ANXIETY   ARTIFICIAL SALIVA (BIOTENE DRY MOUTH) LOZG    Use as directed 1 tablet in the mouth or throat 4 (four) times daily as needed.   ASPIRIN 81 MG CHEWABLE TABLET    Chew 81 mg by mouth daily.   BETIMOL 0.25 % OPHTHALMIC SOLUTION    Place 1-2 drops into both eyes 2 (two) times daily.   BOTULINUM TOXIN TYPE A (BOTOX) 100 UNITS SOLR  INJECTION    INJECT 300 UNITS INTO HEAD AND NECK EVERY 90 DAYS BY MD   CLINDAMYCIN (CLEOCIN) 150 MG CAPSULE    Take '600mg'$  1 hour prior to dental procedure   DIVALPROEX (DEPAKOTE) 125 MG DR TABLET    Take 1 tablet (125 mg total) by mouth 2 (two) times daily.   ESOMEPRAZOLE (NEXIUM) 20 MG CAPSULE    Take 1 capsule (20 mg total) by mouth 2 (two) times daily.   LATANOPROST (XALATAN) 0.005 % OPHTHALMIC SOLUTION    1 drop at bedtime.   LITHIUM CARBONATE 150 MG CAPSULE    Take 1 capsule (150 mg total) by mouth in the morning, at noon, and at bedtime.   LOSARTAN (COZAAR) 50 MG TABLET    TAKE ONE TABLET BY MOUTH ONCE DAILY   MAGNESIUM PO    Take by mouth.   MULTIPLE VITAMIN (MULTIVITAMIN) TABLET    Take 1 tablet by mouth daily.   ONABOTULINUMTOXINA (BOTULINUM TOXIN TYPE A IM)    Inject 100 Units into the muscle.   TOPIRAMATE (TOPAMAX) 25 MG TABLET    TAKE ONE TABLET BY MOUTH TWICE DAILY IN THE MORNING AND IN THE EVENING   TRIAMCINOLONE OINTMENT (KENALOG) 0.5 %    Apply 1 Application topically 2 (two) times daily.  Modified Medications   No medications on file  Discontinued Medications   No medications on file    Physical Exam:  Vitals:   03/13/22 1328  BP: 118/62  Pulse: (!) 46  Temp: (!) 97.3 F (36.3 C)  SpO2: 100%  Weight: 121 lb 12.8 oz (55.2 kg)  Height: '5\' 1"'$  (1.549 m)   Body mass index is 23.01 kg/m. Wt Readings from Last 3 Encounters:  03/13/22 121 lb 12.8 oz (55.2 kg)  02/14/22 118 lb (53.5 kg)  02/07/22 117 lb (53.1 kg)    Physical Exam Vitals and nursing note reviewed.  Constitutional:      Appearance: Normal appearance.  Cardiovascular:     Rate and Rhythm: Normal rate.  Pulmonary:     Effort: Pulmonary effort is normal.     Breath sounds: Normal breath sounds.  Musculoskeletal:        General: Normal range of motion.  Skin:    General: Skin is warm.  Neurological:     Mental Status: She is alert.     Labs reviewed: Basic Metabolic Panel: Recent Labs     04/09/21 1716  NA 141  K 4.7  CL 109  CO2 24  GLUCOSE 93  BUN 21  CREATININE 0.99*  CALCIUM 10.7*  TSH 2.08   Liver Function Tests: Recent Labs    04/09/21 1716  AST 20  ALT 11  BILITOT 0.6  PROT 6.6   No results for input(s): "LIPASE", "AMYLASE" in the last 8760 hours. No results for input(s): "  AMMONIA" in the last 8760 hours. CBC: Recent Labs    04/09/21 1716  WBC 6.5  NEUTROABS 4,485  HGB 12.8  HCT 38.6  MCV 97.7  PLT 306   Lipid Panel: Recent Labs    04/09/21 1716  CHOL 271*  HDL 67  LDLCALC 166*  TRIG 213*  CHOLHDL 4.0   TSH: Recent Labs    04/09/21 1716  TSH 2.08   A1C: No results found for: "HGBA1C"   Assessment/Plan  1. Bipolar I disorder, single manic episode, in full remission ([place]) Continue with lithium.  Will add Lexapro hoping this will help sleep disorder and malaise and fatigue  2. Anxiety state Continue alprazolam at bedtime as before  3. Cervical dystonia Has received botulinum injections.  This problem is still ongoing  4. Cognitive impairment Patient had repeat cognitive test after last month where she scored 13/30.  MMSE was done and she scored 30/30.  As I stated there was a disconnect between talking to her and the results of the test   Alain Honey, MD [place] [phone]

## 2022-03-15 ENCOUNTER — Other Ambulatory Visit: Payer: Self-pay | Admitting: Nurse Practitioner

## 2022-03-15 ENCOUNTER — Encounter: Payer: Medicare PPO | Admitting: Gastroenterology

## 2022-03-15 ENCOUNTER — Ambulatory Visit: Payer: Medicare PPO | Admitting: Neurology

## 2022-03-15 DIAGNOSIS — G243 Spasmodic torticollis: Secondary | ICD-10-CM | POA: Diagnosis not present

## 2022-03-15 DIAGNOSIS — F304 Manic episode in full remission: Secondary | ICD-10-CM

## 2022-03-15 DIAGNOSIS — R41841 Cognitive communication deficit: Secondary | ICD-10-CM | POA: Diagnosis not present

## 2022-03-15 MED ORDER — ONABOTULINUMTOXINA 100 UNITS IJ SOLR
300.0000 [IU] | Freq: Once | INTRAMUSCULAR | Status: AC
  Administered 2022-03-15: 300 [IU] via INTRAMUSCULAR

## 2022-03-15 NOTE — Procedures (Signed)
[facility]   Procedure Note Botox  Attending: Dr. Wells Guiles Niurka Benecke  Preoperative Diagnosis(es): Cervical Dystonia  Result History  Not sure if helping b/c has other neck issues going on (receiving injections from Dr. Davy Pique as well).  Told her we could hold off on further botox but she wishes to proceed  Consent obtained from: The patient Benefits discussed included, but were not limited to decreased muscle tightness, increased joint range of motion, and decreased pain.  Risk discussed included, but were not limited pain and discomfort, bleeding, bruising, excessive weakness, venous thrombosis, muscle atrophy and dysphagia.  A copy of the patient medication guide was given to the patient which explains the blackbox warning.  Patients identity and treatment sites confirmed Yes.  .  Details of Procedure: Skin was cleaned with alcohol.  A 30 gauge, 70m  needle was introduced to the target muscle, except for posterior splenius where 27 gauge, 1.5 inch needle used.   Prior to injection, the needle plunger was aspirated to make sure the needle was not within a blood vessel.  There was no blood retrieved on aspiration.    Following is a summary of the muscles injected  And the amount of Botulinum toxin used:   Dilution 0.9% preservative free saline mixed with 100 u Botox type A to make 10 U per 0.1cc  Injections  Location Left  Right Units Number of sites        Sternocleidomastoid 60  60 1  Splenius Capitus, posterior approach  100 100 1  Splenius Capitus, lateral approach  40 40 1  Levator Scapulae      Trapezius 20/20 20/20 80 4  L cervical paraspinal C5 '20  20 1  '$ TOTAL UNITS:   300     Agent: Botulinum Type A ( Onobotulinum Toxin type A ).  3 vials of Botox were used, each containing 100 units and freshly diluted with 1 mL of sterile, non-preserved saline   Total injected (Units): 300  Total wasted (Units): 0   Pt tolerated procedure well without complications.    Reinjection is anticipated in 3 months.

## 2022-03-15 NOTE — Telephone Encounter (Signed)
Pharmacy requested refill.  Last Epic RF: 02/07/2022 No Contract on file, added note to upcoming appointment.   Pended Rx and sent to [facility]( Dr. Sabra Heck out of office)

## 2022-03-18 ENCOUNTER — Encounter (HOSPITAL_COMMUNITY): Payer: Self-pay | Admitting: Gastroenterology

## 2022-03-18 DIAGNOSIS — R41841 Cognitive communication deficit: Secondary | ICD-10-CM | POA: Diagnosis not present

## 2022-03-20 DIAGNOSIS — R41841 Cognitive communication deficit: Secondary | ICD-10-CM | POA: Diagnosis not present

## 2022-03-22 DIAGNOSIS — R41841 Cognitive communication deficit: Secondary | ICD-10-CM | POA: Diagnosis not present

## 2022-03-25 ENCOUNTER — Ambulatory Visit (HOSPITAL_COMMUNITY): Payer: Medicare PPO | Admitting: Anesthesiology

## 2022-03-25 ENCOUNTER — Encounter (HOSPITAL_COMMUNITY)
Admission: RE | Disposition: A | Discharge status: Home / Self Care | Payer: Self-pay | Source: Ambulatory Visit | Attending: Gastroenterology

## 2022-03-25 ENCOUNTER — Encounter (HOSPITAL_COMMUNITY): Payer: Self-pay | Admitting: Gastroenterology

## 2022-03-25 ENCOUNTER — Ambulatory Visit (HOSPITAL_BASED_OUTPATIENT_CLINIC_OR_DEPARTMENT_OTHER): Payer: Medicare PPO | Admitting: Anesthesiology

## 2022-03-25 ENCOUNTER — Ambulatory Visit (HOSPITAL_COMMUNITY)
Admission: RE | Admit: 2022-03-25 | Discharge: 2022-03-25 | Disposition: A | Discharge status: Home / Self Care | Payer: Medicare PPO | Source: Ambulatory Visit | Attending: Gastroenterology | Admitting: Gastroenterology

## 2022-03-25 ENCOUNTER — Other Ambulatory Visit: Payer: Self-pay

## 2022-03-25 DIAGNOSIS — I10 Essential (primary) hypertension: Secondary | ICD-10-CM

## 2022-03-25 DIAGNOSIS — J312 Chronic pharyngitis: Secondary | ICD-10-CM | POA: Insufficient documentation

## 2022-03-25 DIAGNOSIS — R933 Abnormal findings on diagnostic imaging of other parts of digestive tract: Secondary | ICD-10-CM | POA: Diagnosis not present

## 2022-03-25 DIAGNOSIS — J45909 Unspecified asthma, uncomplicated: Secondary | ICD-10-CM

## 2022-03-25 DIAGNOSIS — K219 Gastro-esophageal reflux disease without esophagitis: Secondary | ICD-10-CM | POA: Diagnosis not present

## 2022-03-25 DIAGNOSIS — R1310 Dysphagia, unspecified: Secondary | ICD-10-CM

## 2022-03-25 DIAGNOSIS — Z79899 Other long term (current) drug therapy: Secondary | ICD-10-CM | POA: Diagnosis not present

## 2022-03-25 DIAGNOSIS — R1314 Dysphagia, pharyngoesophageal phase: Secondary | ICD-10-CM

## 2022-03-25 HISTORY — PX: ESOPHAGOGASTRODUODENOSCOPY (EGD) WITH PROPOFOL: SHX5813

## 2022-03-25 SURGERY — ESOPHAGOGASTRODUODENOSCOPY (EGD) WITH PROPOFOL
Anesthesia: Monitor Anesthesia Care

## 2022-03-25 MED ORDER — SODIUM CHLORIDE 0.9 % IV SOLN: INTRAVENOUS | Status: DC

## 2022-03-25 MED ORDER — LACTATED RINGERS IV SOLN
INTRAVENOUS | Status: DC
  Administered 2022-03-25: 1000 mL via INTRAVENOUS

## 2022-03-25 MED ORDER — PROPOFOL 500 MG/50ML IV EMUL
INTRAVENOUS | Status: DC | PRN
  Administered 2022-03-25: 150 ug/kg/min via INTRAVENOUS

## 2022-03-25 SURGICAL SUPPLY — 15 items

## 2022-03-25 NOTE — Op Note (Signed)
[facility] Patient Name: Tracey [name] Procedure Date: 03/25/2022 MRN: [id number] Attending MD: Estill Cotta. Loletha Carrow , MD, [id number] Date of Birth: Apr 18, 1940 CSN: [id number] Age: 82 Admit Type: Outpatient Procedure:                Upper GI endoscopy Indications:              Esophageal dysphagia, Abnormal cine-esophagram                            (barium tablet briefly delayed passing through mid                            esophagus on modified barium study)                           Patient reports years of "sore throat" Providers:                Mallie Mussel L. Loletha Carrow, MD, Mikey College, RN, Fransico Setters                            [place], Technician Referring MD:              Medicines:                Monitored Anesthesia Care Complications:            No immediate complications. Estimated Blood Loss:     Estimated blood loss: none. Procedure:                Pre-Anesthesia Assessment:                           - Prior to the procedure, a History and Physical                            was performed, and patient medications and                            allergies were reviewed. The patient's tolerance of                            previous anesthesia was also reviewed. The risks                            and benefits of the procedure and the sedation                            options and risks were discussed with the patient.                            All questions were answered, and informed consent                            was obtained. Prior Anticoagulants: The patient has                            taken  no anticoagulant or antiplatelet agents. ASA                            Grade Assessment: III - A patient with severe                            systemic disease. After reviewing the risks and                            benefits, the patient was deemed in satisfactory                            condition to undergo the procedure.                           After  obtaining informed consent, the endoscope was                            passed under direct vision. Throughout the                            procedure, the patient's blood pressure, pulse, and                            oxygen saturations were monitored continuously. The                            [id number] Olympus endoscope was introduced                            through the mouth, and advanced to the second part                            of duodenum. The upper GI endoscopy was                            accomplished without difficulty. The patient                            tolerated the procedure well. Scope In: Scope Out: Findings:      The larynx was normal. (Briefly visualized on scope withdrawal)      The esophagus was normal.      The stomach was normal.      The cardia and gastric fundus were normal on retroflexion.      The examined duodenum was normal. Impression:               - Normal larynx.                           - Normal esophagus.                           - Normal stomach.                           -  Normal examined duodenum.                           - No specimens collected.                           This patient most likely has mild esophageal                            dysmotility (presbyesophagus). Her main complaint                            is chronic sore throat, for which no                            gastrointestinal cause was seen or suggested by her                            history. Moderate Sedation:      MAC sedation used Recommendation:           - Patient has a contact number available for                            emergencies. The signs and symptoms of potential                            delayed complications were discussed with the                            patient. Return to normal activities tomorrow.                            Written discharge instructions were provided to the                            patient.                            - Resume previous diet.                           - Continue present medications.                           - Return to primary care physician. Procedure Code(s):        --- Professional ---                           [phone], Esophagogastroduodenoscopy, flexible,                            transoral; diagnostic, including collection of                            specimen(s) by brushing or washing, when performed                            (  separate procedure) Diagnosis Code(s):        --- Professional ---                           R13.14, Dysphagia, pharyngoesophageal phase                           R93.3, Abnormal findings on diagnostic imaging of                            other parts of digestive tract CPT copyright 2022 American Medical Association. All rights reserved. The codes documented in this report are preliminary and upon coder review may  be revised to meet current compliance requirements. Krzysztof Reichelt L. Loletha Carrow, MD 03/25/2022 8:23:32 AM This report has been signed electronically. Number of Addenda: 0

## 2022-03-25 NOTE — Interval H&P Note (Signed)
History and Physical Interval Note:  03/25/2022 7:59 AM  Tracey [name]  has presented today for surgery, with the diagnosis of dysphagia.  The various methods of treatment have been discussed with the patient and family. After consideration of risks, benefits and other options for treatment, the patient has consented to  Procedure(s): ESOPHAGOGASTRODUODENOSCOPY (EGD) WITH PROPOFOL (N/A) as a surgical intervention.  The patient's history has been reviewed, patient examined, no change in status, stable for surgery.  I have reviewed the patient's chart and labs.  Questions were answered to the patient's satisfaction.     [name]

## 2022-03-25 NOTE — Discharge Instructions (Signed)
YOU HAD AN ENDOSCOPIC PROCEDURE TODAY: Refer to the procedure report and other information in the discharge instructions given to you for any specific questions about what was found during the examination. If this information does not answer your questions, please call [place] office at [phone] to clarify.   YOU SHOULD EXPECT: Some feelings of bloating in the abdomen. Passage of more gas than usual. Walking can help get rid of the air that was put into your GI tract during the procedure and reduce the bloating. If you had a lower endoscopy (such as a colonoscopy or flexible sigmoidoscopy) you may notice spotting of blood in your stool or on the toilet paper. Some abdominal soreness may be present for a day or two, also.  DIET: Your first meal following the procedure should be a light meal and then it is ok to progress to your normal diet. A half-sandwich or bowl of soup is an example of a good first meal. Heavy or fried foods are harder to digest and may make you feel nauseous or bloated. Drink plenty of fluids but you should avoid alcoholic beverages for 24 hours. If you had a esophageal dilation, please see attached instructions for diet.    ACTIVITY: Your care partner should take you home directly after the procedure. You should plan to take it easy, moving slowly for the rest of the day. You can resume normal activity the day after the procedure however YOU SHOULD NOT DRIVE, use power tools, machinery or perform tasks that involve climbing or major physical exertion for 24 hours (because of the sedation medicines used during the test).   SYMPTOMS TO REPORT IMMEDIATELY: A gastroenterologist can be reached at any hour. Please call [phone]  for any of the following symptoms:   Following upper endoscopy (EGD, EUS, ERCP, esophageal dilation) Vomiting of blood or coffee ground material  New, significant abdominal pain  New, significant chest pain or pain under the shoulder blades  Painful or  persistently difficult swallowing  New shortness of breath  Black, tarry-looking or red, bloody stools  FOLLOW UP:  If any biopsies were taken you will be contacted by phone or by letter within the next 1-3 weeks. Call [phone]  if you have not heard about the biopsies in 3 weeks.  Please also call with any specific questions about appointments or follow up tests.  

## 2022-03-25 NOTE — Anesthesia Preprocedure Evaluation (Signed)
Anesthesia Evaluation  Patient identified by MRN, date of birth, ID band Patient awake    Reviewed: Allergy & Precautions, NPO status , Patient's Chart, lab work & pertinent test results  Airway Mallampati: II  TM Distance: >3 FB Neck ROM: Full    Dental no notable dental hx.    Pulmonary asthma    Pulmonary exam normal        Cardiovascular hypertension, Pt. on medications + DOE  + dysrhythmias  Rhythm:Regular Rate:Normal     Neuro/Psych  Headaches  Anxiety  Bipolar Disorder   Parkinsons  Neuromuscular disease    GI/Hepatic Neg liver ROS,GERD  Medicated,,Dysphagia    Endo/Other  negative endocrine ROS    Renal/GU   negative genitourinary   Musculoskeletal  (+) Arthritis , Osteoarthritis,    Abdominal Normal abdominal exam  (+)   Peds  Hematology  (+) Blood dyscrasia, anemia   Anesthesia Other Findings   Reproductive/Obstetrics                             Anesthesia Physical Anesthesia Plan  ASA: 3  Anesthesia Plan: MAC   Post-op Pain Management:    Induction: Intravenous  PONV Risk Score and Plan: 2 and Treatment may vary due to age or medical condition and Propofol infusion  Airway Management Planned: Simple Face Mask, Natural Airway and Nasal Cannula  Additional Equipment: None  Intra-op Plan:   Post-operative Plan:   Informed Consent: I have reviewed the patients History and Physical, chart, labs and discussed the procedure including the risks, benefits and alternatives for the proposed anesthesia with the patient or authorized representative who has indicated his/her understanding and acceptance.     Dental advisory given  Plan Discussed with: CRNA  Anesthesia Plan Comments:        Anesthesia Quick Evaluation

## 2022-03-25 NOTE — Anesthesia Postprocedure Evaluation (Signed)
Anesthesia Post Note  Patient: Tracey [name]  Procedure(s) Performed: ESOPHAGOGASTRODUODENOSCOPY (EGD) WITH PROPOFOL     Patient location during evaluation: Endoscopy Anesthesia Type: MAC Level of consciousness: awake and alert Pain management: pain level controlled Vital Signs Assessment: post-procedure vital signs reviewed and stable Respiratory status: spontaneous breathing, nonlabored ventilation, respiratory function stable and patient connected to nasal cannula oxygen Cardiovascular status: stable and blood pressure returned to baseline Postop Assessment: no apparent nausea or vomiting Anesthetic complications: no   No notable events documented.  Last Vitals:  Vitals:   03/25/22 0830 03/25/22 0840  BP: (!) 125/50 (!) 131/46  Pulse: (!) 58 (!) 52  Resp: 18 (!) 25  Temp:    SpO2: 100% 99%    Last Pain:  Vitals:   03/25/22 0840  TempSrc:   PainSc: 0-No pain                 Belenda Cruise P Ezekiah Massie

## 2022-03-25 NOTE — Transfer of Care (Signed)
Immediate Anesthesia Transfer of Care Note  Patient: [place] GERRIANNE ABBASI  Procedure(s) Performed: ESOPHAGOGASTRODUODENOSCOPY (EGD) WITH PROPOFOL  Patient Location: PACU  Anesthesia Type:MAC  Level of Consciousness: awake, alert , and oriented  Airway & Oxygen Therapy: Patient Spontanous Breathing and Patient connected to face mask oxygen  Post-op Assessment: Report given to RN and Post -op Vital signs reviewed and stable  Post vital signs: Reviewed and stable  Last Vitals:  Vitals Value Taken Time  BP    Temp    Pulse 52 03/25/22 0823  Resp 19 03/25/22 0823  SpO2 100 % 03/25/22 0823  Vitals shown include unvalidated device data.  Last Pain:  Vitals:   03/25/22 0738  TempSrc: Tympanic  PainSc: 0-No pain         Complications: No notable events documented.

## 2022-03-25 NOTE — H&P (Signed)
History and Physical:  This patient presents for endoscopic testing for: Esophageal dysphagia  82 year old woman seen in the office for consultation February 14, 2022 for dysphagia.  Clinical details in that note.  Patient reports symptoms have continued much the same since then.  Barium esophagram suggested transient delay of tablet in the mid esophagus, and it passed after consuming more liquids.  Patient is otherwise without complaints or active issues today.   Past Medical History: Past Medical History:  Diagnosis Date   Anemia    Anxiety    Arthritis    Cardiac conduction disorder 03/30/2012   Overview:  STORY: ETT 03/09/2012 Echo 03/07/2012 normal Dr Einar Gip, bradycardia felt due to glaucoma eye drops   Cervical dystonia    Diverticulosis of colon    DOE (dyspnea on exertion) 03/25/2018   Gastroesophageal cancer (HCC)    GERD (gastroesophageal reflux disease)    GIST (gastrointestinal stroma tumor), malignant, colon (HCC)    Glaucoma    Heart murmur    History of colon polyps 10/24/2008   Hypertension    Major neurocognitive disorder due to Parkinson's disease, possible    Tremors possible parkinsons   Manic disorder, single episode, in full remission ([place]) 12/01/2009   Sixth nerve palsy      Past Surgical History: Past Surgical History:  Procedure Laterality Date   BILATERAL SALPINGOOPHORECTOMY  09/22/2007   CATARACT EXTRACTION Bilateral    Gastrointestinal Stroma Tumor,  Other  1960   GIST Surgery   ILEOCECETOMY  09/22/2007   OVARIAN CYST REMOVAL Right 1967   SMALL INTESTINE SURGERY  09/22/2007   TOTAL KNEE ARTHROPLASTY Right 10/05/2019   Procedure: RIGHT TOTAL KNEE ARTHROPLASTY;  Surgeon: Meredith Pel, MD;  Location: [facility];  Service: Orthopedics;  Laterality: Right;   TOTAL VAGINAL HYSTERECTOMY  1986   Fibroids    Allergies: Allergies  Allergen Reactions   Lisinopril Cough   Penicillins Itching    50 years ago   Adhesive [Tape] Itching    Atorvastatin Itching   Dilaudid [Hydromorphone Hcl] Itching   Hydromorphone Itching    Outpatient Meds: Current Facility-Administered Medications  Medication Dose Route Frequency Provider Last Rate Last Admin   0.9 %  sodium chloride infusion   Intravenous Continuous Danis, Estill Cotta III, MD       lactated ringers infusion   Intravenous Continuous Danis, Estill Cotta III, MD          ___________________________________________________________________ Objective   Exam:  There were no vitals taken for this visit.  CV: regular , S1/S2 Resp: clear to auscultation bilaterally, normal RR and effort noted GI: soft, no tenderness, with active bowel sounds.   Assessment: Esophageal dysphagia  Question of esophageal stricture versus motility disorder. Plan:  EGD with possible dilation if stricture encountered.   The patient is appropriate for an endoscopic procedure in the ambulatory setting.   - Wilfrid Lund, MD

## 2022-03-26 ENCOUNTER — Other Ambulatory Visit: Payer: Self-pay | Admitting: Nurse Practitioner

## 2022-03-26 DIAGNOSIS — R41841 Cognitive communication deficit: Secondary | ICD-10-CM | POA: Diagnosis not present

## 2022-03-27 ENCOUNTER — Encounter (HOSPITAL_COMMUNITY): Payer: Self-pay | Admitting: Gastroenterology

## 2022-03-27 DIAGNOSIS — R41841 Cognitive communication deficit: Secondary | ICD-10-CM | POA: Diagnosis not present

## 2022-03-29 ENCOUNTER — Other Ambulatory Visit: Payer: Self-pay | Admitting: Nurse Practitioner

## 2022-03-29 DIAGNOSIS — B370 Candidal stomatitis: Secondary | ICD-10-CM

## 2022-03-29 MED ORDER — FLUCONAZOLE 100 MG PO TABS: 100.0000 mg | ORAL_TABLET | Freq: Every day | ORAL | 0 refills | Status: DC

## 2022-04-01 DIAGNOSIS — R41841 Cognitive communication deficit: Secondary | ICD-10-CM | POA: Diagnosis not present

## 2022-04-02 DIAGNOSIS — H47011 Ischemic optic neuropathy, right eye: Secondary | ICD-10-CM | POA: Diagnosis not present

## 2022-04-02 DIAGNOSIS — H401112 Primary open-angle glaucoma, right eye, moderate stage: Secondary | ICD-10-CM | POA: Diagnosis not present

## 2022-04-02 DIAGNOSIS — H401121 Primary open-angle glaucoma, left eye, mild stage: Secondary | ICD-10-CM | POA: Diagnosis not present

## 2022-04-02 DIAGNOSIS — H4922 Sixth [abducent] nerve palsy, left eye: Secondary | ICD-10-CM | POA: Diagnosis not present

## 2022-04-03 DIAGNOSIS — R41841 Cognitive communication deficit: Secondary | ICD-10-CM | POA: Diagnosis not present

## 2022-04-04 ENCOUNTER — Non-Acute Institutional Stay: Payer: Medicare PPO | Admitting: Nurse Practitioner

## 2022-04-04 ENCOUNTER — Encounter: Payer: Self-pay | Admitting: Nurse Practitioner

## 2022-04-04 VITALS — BP 118/70 | HR 60 | Temp 97.7°F | Ht 61.0 in | Wt 118.0 lb

## 2022-04-04 DIAGNOSIS — B370 Candidal stomatitis: Secondary | ICD-10-CM | POA: Diagnosis not present

## 2022-04-04 DIAGNOSIS — I10 Essential (primary) hypertension: Secondary | ICD-10-CM | POA: Diagnosis not present

## 2022-04-04 DIAGNOSIS — G243 Spasmodic torticollis: Secondary | ICD-10-CM

## 2022-04-04 DIAGNOSIS — K5901 Slow transit constipation: Secondary | ICD-10-CM | POA: Diagnosis not present

## 2022-04-04 DIAGNOSIS — K219 Gastro-esophageal reflux disease without esophagitis: Secondary | ICD-10-CM | POA: Diagnosis not present

## 2022-04-04 DIAGNOSIS — R1312 Dysphagia, oropharyngeal phase: Secondary | ICD-10-CM | POA: Diagnosis not present

## 2022-04-04 DIAGNOSIS — J309 Allergic rhinitis, unspecified: Secondary | ICD-10-CM | POA: Diagnosis not present

## 2022-04-04 DIAGNOSIS — F304 Manic episode in full remission: Secondary | ICD-10-CM | POA: Diagnosis not present

## 2022-04-04 NOTE — Assessment & Plan Note (Signed)
Bipolar disorder, stable, on Lithium, GDR due to bradycardia, takes Alprazolam too, TSH 2.08 04/09/21. Dr Sabra Heck started low dose of Depakote. Lithium level 0.7 12/20/21

## 2022-04-04 NOTE — Assessment & Plan Note (Signed)
blood pressure is controlled on Losartan ?

## 2022-04-04 NOTE — Assessment & Plan Note (Signed)
Metamucil, MiraLax,  effective.  

## 2022-04-04 NOTE — Assessment & Plan Note (Addendum)
Esophageal candidiasis, recurrent thrush identified on swallow study, FEES showed diffused white secretion coating areas of white plaques throughout pharynx. The patient c/o sore in buccal sides of tongue and in her throat when swallows. Treated with Diflucan in the past, underwent GI evaluation, EGD 03/25/22 normal findings. 03/29/22 called and reported recurrent sore tongue and white coating in mouth, restarted Diflucan, better today in clinic.

## 2022-04-04 NOTE — Assessment & Plan Note (Signed)
stable, on Omeprazole, Hgb 12.8 04/09/21 

## 2022-04-04 NOTE — Assessment & Plan Note (Signed)
Cervical degenerative changes/dystonia, better, CT/MRI 12/2019 ruled out acute process, aches in neck R>L travels to the back of head, comes and goes, not disabling, f/u Neurosurgery. Failed Cymbalta, Gabapentin, Robaxin, Ultracet. s/p Botulinum Toxin inj by Neurology. ESR 9, CRP 2.7 10/09/20. The patient stated Alprazolam and Tylenol are kind of effective. Improved on Topamax, but associated with weight loss.  No Parkinson's disease, took her off Sinemet per Neurology.  

## 2022-04-04 NOTE — Progress Notes (Signed)
Location:  Clinic [facility]   Place of Service:  Clinic (12) Provider: Marlana Latus NP  Code Status: DNR Goals of Care:     04/04/2022    1:18 PM  Advanced Directives  Does Patient Have a Medical Advance Directive? Yes  Type of Paramedic of [place];Living will  Does patient want to make changes to medical advance directive? No - Patient declined  Copy of [place] in Chart? Yes - validated most recent copy scanned in chart (See row information)     Chief Complaint  Patient presents with   Acute Visit    Reoccurring thrush, persistent sore throat, and eye issues. Sign contract.     HPI: Patient is a 82 y.o. female seen today for persisted running nose, facial pressure, sore throat, the patient desires ENT evaluation.    Esophageal candidiasis, recurrent thrush identified on swallow study, FEES showed diffused white secretion coating areas of white plaques throughout pharynx. The patient c/o sore in buccal sides of tongue and in her throat when swallows. Treated with Diflucan in the past, underwent GI evaluation, EGD 03/25/22 normal findings. 03/29/22 called and reported recurrent sore tongue and white coating in mouth, restarted Diflucan, better today in clinic.                Edema, LLE negative DVT venous US, L>R. Medial thickened swelling area about her palm sized, mild warmth, redness, and indurated area for a few month. Varicose veins LLE not new.  Tinnitus, underwent eval of  ENT, suggested music background.              Hx of GIST of small intestine s/p resection. MiraLax, Psyllium              Dysphagia, GI evaluated, EGD 03/25/22 wnl, working with ST, thicken liquids.              Constipation, Metamucil, MiraLax,  effective.              GERD, stable, on Omeprazole, Hgb 12.8 04/09/21             HTN, blood pressure is controlled on Losartan             Bipolar disorder, stable, on Lithium, GDR due to bradycardia, takes Alprazolam too,  TSH 2.08 04/09/21. Dr Sabra Heck started low dose of Depakote. Lithium level 0.7 12/20/21             Bradycardia, improved from 40s to 50s. Hx of normalized HR after Tylenol PM dc'd, GDR of Lithium helped too.              Hypercalcemia, f/u endocrinology, Ca 10.7 04/09/21,  PTH 79 01/12/20. GDR of Lithium may help             OA, takes  Tylenol, s/p R knee arthroplasty.             Cervical degenerative changes/dystonia, better, CT/MRI 12/2019 ruled out acute process, aches in neck R>L travels to the back of head, comes and goes, not disabling, f/u Neurosurgery. Failed Cymbalta, Gabapentin, Robaxin, Ultracet. s/p Botulinum Toxin inj by Neurology. ESR 9, CRP 2.7 10/09/20. The patient stated Alprazolam and Tylenol are kind of effective. Improved on Topamax, but associated with weight loss.  No Parkinson's disease, took her off Sinemet per Neurology.              Cognitive impairment: 12/2019 MRI brain showed chronic microvascular ischemic changes, no acute disease  with mild cortical atrophy, TSH 2.08 04/09/21             Hyperlipidemia, LDL 166 04/09/21, on diet, allergic to statin.     Past Medical History:  Diagnosis Date   Anemia    Anxiety    Arthritis    Cardiac conduction disorder 03/30/2012   Overview:  STORY: ETT 03/09/2012 Echo 03/07/2012 normal Dr Einar Gip, bradycardia felt due to glaucoma eye drops   Cervical dystonia    Diverticulosis of colon    DOE (dyspnea on exertion) 03/25/2018   Gastroesophageal cancer (HCC)    GERD (gastroesophageal reflux disease)    GIST (gastrointestinal stroma tumor), malignant, colon (HCC)    Glaucoma    Heart murmur    History of colon polyps 10/24/2008   Hypertension    Major neurocognitive disorder due to Parkinson's disease, possible    Tremors possible parkinsons   Manic disorder, single episode, in full remission ([place]) 12/01/2009   Sixth nerve palsy     Past Surgical History:  Procedure Laterality Date   BILATERAL SALPINGOOPHORECTOMY  09/22/2007    CATARACT EXTRACTION Bilateral    ESOPHAGOGASTRODUODENOSCOPY (EGD) WITH PROPOFOL N/A 03/25/2022   Procedure: ESOPHAGOGASTRODUODENOSCOPY (EGD) WITH PROPOFOL;  Surgeon: Doran Stabler, MD;  Location: [facility];  Service: Gastroenterology;  Laterality: N/A;   Gastrointestinal Stroma Tumor,  Other  1960   GIST Surgery   ILEOCECETOMY  09/22/2007   OVARIAN CYST REMOVAL Right 1967   SMALL INTESTINE SURGERY  09/22/2007   TOTAL KNEE ARTHROPLASTY Right 10/05/2019   Procedure: RIGHT TOTAL KNEE ARTHROPLASTY;  Surgeon: Meredith Pel, MD;  Location: [facility];  Service: Orthopedics;  Laterality: Right;   TOTAL VAGINAL HYSTERECTOMY  1986   Fibroids    Allergies  Allergen Reactions   Lisinopril Cough   Penicillins Itching    50 years ago   Adhesive [Tape] Itching   Atorvastatin Itching   Dilaudid [Hydromorphone Hcl] Itching   Hydromorphone Itching    Allergies as of 04/04/2022       Reactions   Lisinopril Cough   Penicillins Itching   50 years ago   Adhesive [tape] Itching   Atorvastatin Itching   Dilaudid [hydromorphone Hcl] Itching   Hydromorphone Itching        Medication List        Accurate as of April 04, 2022  2:20 PM. If you have any questions, ask your nurse or doctor.          STOP taking these medications    Betimol 0.25 % ophthalmic solution Generic drug: timolol Stopped by: Deasiah Hagberg X Aitanna Haubner, NP   Botox 100 units Solr injection Generic drug: botulinum toxin Type A Stopped by: Reynoldo Mainer X Miaya Lafontant, NP   dorzolamide 2 % ophthalmic solution Commonly known as: TRUSOPT Stopped by: Rhythm Wigfall X Jetta Murray, NP   escitalopram 5 MG tablet Commonly known as: Lexapro Stopped by: Yancy Hascall X Veena Sturgess, NP   esomeprazole 20 MG capsule Commonly known as: NEXIUM Stopped by: Inaya Gillham X Indiana Pechacek, NP   tobramycin-dexamethasone ophthalmic solution Commonly known as: TOBRADEX Stopped by: Denvil Canning X Caree Wolpert, NP       TAKE these medications    ALPRAZolam 0.5 MG tablet Commonly known as: XANAX TAKE ONE TABLET BY  MOUTH TWICE DAILY AS NEEDED FOR ANXIETY   aspirin 81 MG chewable tablet Chew 81 mg by mouth daily.   Biotene Dry Mouth Lozg Use as directed 1 tablet in the mouth or throat 4 (four) times daily as needed.   bismuth  subsalicylate [id number] suspension Commonly known as: PEPTO BISMOL Take 30 mLs by mouth every 6 (six) hours as needed for diarrhea or loose stools.   clindamycin 150 MG capsule Commonly known as: CLEOCIN Take 600mg  1 hour prior to dental procedure   fluconazole 100 MG tablet Commonly known as: Diflucan Take 1 tablet (100 mg total) by mouth daily for 14 days.   latanoprost 0.005 % ophthalmic solution Commonly known as: XALATAN Place 1 drop into both eyes at bedtime.   lithium carbonate 150 MG capsule Take 150 mg by mouth 3 (three) times daily with meals.   losartan 50 MG tablet Commonly known as: COZAAR TAKE ONE TABLET BY MOUTH ONCE DAILY   MAGNESIUM PO Take 400 mg by mouth daily.   multivitamin tablet Take 1 tablet by mouth daily.   polyethylene glycol 17 g packet Commonly known as: MIRALAX / GLYCOLAX Take 17 g by mouth daily.   Restasis 0.05 % ophthalmic emulsion Generic drug: cycloSPORINE Place 1 drop into both eyes 2 (two) times daily.   topiramate 25 MG tablet Commonly known as: TOPAMAX TAKE ONE TABLET BY MOUTH TWICE DAILY IN THE MORNING AND IN THE EVENING   triamcinolone ointment 0.5 % Commonly known as: KENALOG Apply 1 Application topically 2 (two) times daily as needed (place on leg).        Review of Systems:  Review of Systems  Constitutional:  Negative for appetite change, fatigue and fever.  HENT:  Positive for hearing loss, mouth sores, postnasal drip, rhinorrhea, sinus pressure, sore throat, tinnitus and trouble swallowing. Negative for congestion, facial swelling and voice change.        C/o sore buccal aspect tongue and in throat when swallows.   Eyes:  Negative for visual disturbance.       Glaucoma, diplopia R+L lateral  peripheral visual fields only.   Respiratory:  Negative for shortness of breath.        Occasionally DOE, hacking cough  Cardiovascular:  Positive for leg swelling.  Gastrointestinal:  Negative for abdominal pain and constipation.  Genitourinary:  Positive for frequency. Negative for dysuria and urgency.       Couple of times at night, no difficulty of returning asleep.   Musculoskeletal:  Positive for arthralgias and gait problem.       Walker.   Skin:  Negative for color change.  Neurological:  Negative for tremors, speech difficulty, light-headedness and headaches.       Comes and goes nature of the neck/occipital pain. Stiff neck. Pain the patent right knee, s/p TKR. Tremor is better since Sinemet.   Psychiatric/Behavioral:  Positive for sleep disturbance. Negative for confusion. The patient is nervous/anxious.        Better mood, feels not as anxious or depressive mood as prior.     Health Maintenance  Topic Date Due   DTaP/Tdap/Td (2 - Td or Tdap) 08/19/2021   COVID-19 Vaccine (5 - 2023-24 season) 04/15/2022 (Originally 01/10/2022)   Medicare Annual Wellness (AWV)  02/08/2023   Pneumonia Vaccine 38+ Years old  Completed   INFLUENZA VACCINE  Completed   DEXA SCAN  Completed   Zoster Vaccines- Shingrix  Completed   HPV VACCINES  Aged Out    Physical Exam: Vitals:   04/04/22 1315  BP: 118/70  Pulse: 60  Temp: 97.7 F (36.5 C)  TempSrc: Temporal  SpO2: 99%  Weight: 118 lb (53.5 kg)  Height: 5\' 1"  (1.549 m)   Body mass index is 22.3 kg/m. Physical Exam  Vitals and nursing note reviewed.  Constitutional:      Appearance: Normal appearance.  HENT:     Head: Normocephalic and atraumatic.     Nose: Rhinorrhea present. No congestion.     Mouth/Throat:     Mouth: Mucous membranes are moist.     Pharynx: No oropharyngeal exudate or posterior oropharyngeal erythema.     Comments: Slight red areas on tongue, no heavy yellow or white coating.  Eyes:     Extraocular  Movements: Extraocular movements intact.     Conjunctiva/sclera: Conjunctivae normal.     Pupils: Pupils are equal, round, and reactive to light.  Cardiovascular:     Rate and Rhythm: Regular rhythm. Bradycardia present.     Heart sounds: Murmur heard.     Comments: DP pulses present R+L. HR in 50s Pulmonary:     Effort: Pulmonary effort is normal.     Breath sounds: No rales.  Abdominal:     General: Bowel sounds are normal.     Palpations: Abdomen is soft.     Tenderness: There is no abdominal tenderness.  Musculoskeletal:     Cervical back: Normal range of motion and neck supple.     Right lower leg: Edema present.     Left lower leg: Edema present.     Comments: Chronic R knee pain is improved. S/p TKR right. Stiff neck, comes and goes neck/occipital pain. Trace edema BLE  Skin:    General: Skin is warm and dry.     Comments: Improved the medial lower left leg a palm sized thickened, slightly reddened area, mild discomfort when palpated. DP present left foot. Scattered varicose veins left leg   Neurological:     General: No focal deficit present.     Mental Status: She is alert and oriented to person, place, and time. Mental status is at baseline.     Motor: No weakness.     Coordination: Coordination abnormal.     Gait: Gait abnormal.     Comments: Resting tremor in fingers, near resolution.   Psychiatric:        Mood and Affect: Mood normal.        Behavior: Behavior normal.        Thought Content: Thought content normal.     Labs reviewed: Basic Metabolic Panel: Recent Labs    04/09/21 1716  NA 141  K 4.7  CL 109  CO2 24  GLUCOSE 93  BUN 21  CREATININE 0.99*  CALCIUM 10.7*  TSH 2.08   Liver Function Tests: Recent Labs    04/09/21 1716  AST 20  ALT 11  BILITOT 0.6  PROT 6.6   No results for input(s): "LIPASE", "AMYLASE" in the last 8760 hours. No results for input(s): "AMMONIA" in the last 8760 hours. CBC: Recent Labs    04/09/21 1716  WBC 6.5   NEUTROABS 4,485  HGB 12.8  HCT 38.6  MCV 97.7  PLT 306   Lipid Panel: Recent Labs    04/09/21 1716  CHOL 271*  HDL 67  LDLCALC 166*  TRIG 213*  CHOLHDL 4.0   No results found for: "HGBA1C"  Procedures since last visit: No results found.  Assessment/Plan  Oropharyngeal candidiasis Esophageal candidiasis, recurrent thrush identified on swallow study, FEES showed diffused white secretion coating areas of white plaques throughout pharynx. The patient c/o sore in buccal sides of tongue and in her throat when swallows. Treated with Diflucan in the past, underwent GI evaluation, EGD 03/25/22 normal findings.  03/29/22 called and reported recurrent sore tongue and white coating in mouth, restarted Diflucan, better today in clinic.   Dysphagia  GI evaluated, EGD 03/25/22 wnl, working with ST, thicken liquids.   GERD (gastroesophageal reflux disease)  stable, on Omeprazole, Hgb 12.8 04/09/21  Slow transit constipation Metamucil, MiraLax,  effective.   Essential (primary) hypertension  blood pressure is controlled on Losartan  Bipolar I disorder, single manic episode, in full remission ([place]) Bipolar disorder, stable, on Lithium, GDR due to bradycardia, takes Alprazolam too, TSH 2.08 04/09/21. Dr Sabra Heck started low dose of Depakote. Lithium level 0.7 12/20/21  Cervical dystonia  Cervical degenerative changes/dystonia, better, CT/MRI 12/2019 ruled out acute process, aches in neck R>L travels to the back of head, comes and goes, not disabling, f/u Neurosurgery. Failed Cymbalta, Gabapentin, Robaxin, Ultracet. s/p Botulinum Toxin inj by Neurology. ESR 9, CRP 2.7 10/09/20. The patient stated Alprazolam and Tylenol are kind of effective. Improved on Topamax, but associated with weight loss.  No Parkinson's disease, took her off Sinemet per Neurology.   Allergic sinusitis persisted running nose, facial pressure, sore throat, the patient desires ENT evaluation.    Labs/tests ordered:   none  Next appt:  04/10/2022

## 2022-04-04 NOTE — Assessment & Plan Note (Signed)
GI evaluated, EGD 03/25/22 wnl, working with ST, thicken liquids.

## 2022-04-04 NOTE — Assessment & Plan Note (Signed)
persisted running nose, facial pressure, sore throat, the patient desires ENT evaluation.

## 2022-04-05 DIAGNOSIS — R41841 Cognitive communication deficit: Secondary | ICD-10-CM | POA: Diagnosis not present

## 2022-04-08 DIAGNOSIS — R41841 Cognitive communication deficit: Secondary | ICD-10-CM | POA: Diagnosis not present

## 2022-04-09 DIAGNOSIS — R41841 Cognitive communication deficit: Secondary | ICD-10-CM | POA: Diagnosis not present

## 2022-04-10 ENCOUNTER — Encounter: Payer: Self-pay | Admitting: Family Medicine

## 2022-04-10 ENCOUNTER — Non-Acute Institutional Stay (INDEPENDENT_AMBULATORY_CARE_PROVIDER_SITE_OTHER): Payer: Medicare PPO | Admitting: Family Medicine

## 2022-04-10 ENCOUNTER — Encounter: Payer: Medicare PPO | Admitting: Family Medicine

## 2022-04-10 VITALS — BP 120/80 | HR 58 | Temp 97.4°F | Resp 18 | Ht 61.0 in | Wt 119.5 lb

## 2022-04-10 DIAGNOSIS — R1312 Dysphagia, oropharyngeal phase: Secondary | ICD-10-CM

## 2022-04-10 DIAGNOSIS — G243 Spasmodic torticollis: Secondary | ICD-10-CM | POA: Diagnosis not present

## 2022-04-10 DIAGNOSIS — B370 Candidal stomatitis: Secondary | ICD-10-CM

## 2022-04-10 MED ORDER — FLUCONAZOLE 200 MG PO TABS: 200.0000 mg | ORAL_TABLET | Freq: Every day | ORAL | 1 refills | Status: DC

## 2022-04-10 NOTE — Progress Notes (Signed)
Provider:  Alain Honey, MD  Careteam: Patient Care Team: Wardell Honour, MD as PCP - General (Family Medicine) Marti Sleigh, MD as Consulting Physician (Gynecologic Oncology) Ladell Pier, MD as Consulting Physician (Oncology) Richmond Campbell, MD as Consulting Physician (Gastroenterology) Clent Jacks, MD as Consulting Physician (Ophthalmology) Sabra Heck Precious Haws, MD as Referring Physician (Neurology) Tat, Eustace Quail, DO as Consulting Physician (Neurology)  PLACE OF SERVICE:  [place]  Advanced Directive information    Allergies  Allergen Reactions   Lisinopril Cough   Penicillins Itching    50 years ago   Adhesive [Tape] Itching   Atorvastatin Itching   Dilaudid [Hydromorphone Hcl] Itching   Hydromorphone Itching    Chief Complaint  Patient presents with   Follow-up    One Month Follow up   Quality Metric Gaps    Needs to discuss Tdap vaccine.     HPI: Patient is a 82 y.o. female.  Here for medical management of chronic problems including bipolar 1 disorder, anxiety state, cervical dystonia, dysphagia. Since she was last seen here she has undergone upper endoscopy.  Physician to examine her esophagus that he saw no evidence of Candida or monilia.  She still continues to have some burning in her mouth and throat.  She felt like the 150 mg dose of Diflucan helped but then symptoms returned when she stopped. She still has ongoing problems with cervical dystonia.  Has been receiving botulinum injections in her neck as well as local physical therapy here at friend's home.  Has follow-up visit with ENT find a reason for her symptoms. She continues to take lithium 100 mg twice daily for bipolar disorder.  I had tried her on Lexapro but she only took 3 days worth and said it made her head feel funny.  I was hoping that might help her energy and perk her up a bit because she comes across to me is a person who is outwardly  depressed.  She does take  alprazolam about twice a day along with lithium  Review of Syste symptoms ms:  Review of Systems  Constitutional:  Positive for malaise/fatigue.  HENT:  Positive for sore throat.   Respiratory: Negative.    Cardiovascular: Negative.   Gastrointestinal:  Positive for heartburn.  Musculoskeletal:  Positive for neck pain.  Psychiatric/Behavioral:  Positive for depression. The patient is nervous/anxious.   All other systems reviewed and are negative.   Past Medical History:  Diagnosis Date   Anemia    Anxiety    Arthritis    Cardiac conduction disorder 03/30/2012   Overview:  STORY: ETT 03/09/2012 Echo 03/07/2012 normal Dr Einar Gip, bradycardia felt due to glaucoma eye drops   Cervical dystonia    Diverticulosis of colon    DOE (dyspnea on exertion) 03/25/2018   Gastroesophageal cancer (HCC)    GERD (gastroesophageal reflux disease)    GIST (gastrointestinal stroma tumor), malignant, colon (HCC)    Glaucoma    Heart murmur    History of colon polyps 10/24/2008   Hypertension    Major neurocognitive disorder due to Parkinson's disease, possible    Tremors possible parkinsons   Manic disorder, single episode, in full remission ([place]) 12/01/2009   Sixth nerve palsy    Past Surgical History:  Procedure Laterality Date   BILATERAL SALPINGOOPHORECTOMY  09/22/2007   CATARACT EXTRACTION Bilateral    ESOPHAGOGASTRODUODENOSCOPY (EGD) WITH PROPOFOL N/A 03/25/2022   Procedure: ESOPHAGOGASTRODUODENOSCOPY (EGD) WITH PROPOFOL;  Surgeon: Doran Stabler, MD;  Location:  [facility];  Service: Gastroenterology;  Laterality: N/A;   Gastrointestinal Stroma Tumor,  Other  1960   GIST Surgery   ILEOCECETOMY  09/22/2007   OVARIAN CYST REMOVAL Right 1967   SMALL INTESTINE SURGERY  09/22/2007   TOTAL KNEE ARTHROPLASTY Right 10/05/2019   Procedure: RIGHT TOTAL KNEE ARTHROPLASTY;  Surgeon: Meredith Pel, MD;  Location: [facility];  Service: Orthopedics;  Laterality: Right;   TOTAL VAGINAL  HYSTERECTOMY  1986   Fibroids   Social History:   reports that she has never smoked. She has never used smokeless tobacco. She reports that she does not drink alcohol and does not use drugs.  Family History  Problem Relation Age of Onset   Congestive Heart Failure Mother    Dementia Mother    Heart disease Father    Diabetes Father    Lung cancer Son    Liver disease Neg Hx    Esophageal cancer Neg Hx    Colon cancer Neg Hx     Medications: Patient's Medications  New Prescriptions   No medications on file  Previous Medications   ALPRAZOLAM (XANAX) 0.5 MG TABLET    TAKE ONE TABLET BY MOUTH TWICE DAILY AS NEEDED FOR ANXIETY   ARTIFICIAL SALIVA (BIOTENE DRY MOUTH) LOZG    Use as directed 1 tablet in the mouth or throat 4 (four) times daily as needed.   ASPIRIN 81 MG CHEWABLE TABLET    Chew 81 mg by mouth daily.   BISMUTH SUBSALICYLATE (PEPTO BISMOL) 262 MG/15ML SUSPENSION    Take 30 mLs by mouth every 6 (six) hours as needed for diarrhea or loose stools.   CLINDAMYCIN (CLEOCIN) 150 MG CAPSULE    Take 600mg  1 hour prior to dental procedure   FLUCONAZOLE (DIFLUCAN) 100 MG TABLET    Take 1 tablet (100 mg total) by mouth daily for 14 days.   LATANOPROST (XALATAN) 0.005 % OPHTHALMIC SOLUTION    Place 1 drop into both eyes at bedtime.   LITHIUM CARBONATE 150 MG CAPSULE    Take 150 mg by mouth 3 (three) times daily with meals.   LOSARTAN (COZAAR) 50 MG TABLET    TAKE ONE TABLET BY MOUTH ONCE DAILY   MAGNESIUM PO    Take 400 mg by mouth daily.   MULTIPLE VITAMIN (MULTIVITAMIN) TABLET    Take 1 tablet by mouth daily.   POLYETHYLENE GLYCOL (MIRALAX / GLYCOLAX) 17 G PACKET    Take 17 g by mouth daily.   RESTASIS 0.05 % OPHTHALMIC EMULSION    Place 1 drop into both eyes 2 (two) times daily.   TOPIRAMATE (TOPAMAX) 25 MG TABLET    TAKE ONE TABLET BY MOUTH TWICE DAILY IN THE MORNING AND IN THE EVENING   TRIAMCINOLONE OINTMENT (KENALOG) 0.5 %    Apply 1 Application topically 2 (two) times daily as  needed (place on leg).  Modified Medications   No medications on file  Discontinued Medications   No medications on file    Physical Exam:  There were no vitals filed for this visit. There is no height or weight on file to calculate BMI. Wt Readings from Last 3 Encounters:  04/04/22 118 lb (53.5 kg)  03/25/22 121 lb 11.1 oz (55.2 kg)  03/13/22 121 lb 12.8 oz (55.2 kg)    Physical Exam Vitals and nursing note reviewed.  Constitutional:      Appearance: Normal appearance.  HENT:     Mouth/Throat:     Comments: Continues with white coating  on tongue consistent with monilia Cardiovascular:     Rate and Rhythm: Normal rate and regular rhythm.  Pulmonary:     Effort: Pulmonary effort is normal.     Breath sounds: Normal breath sounds.  Abdominal:     General: Bowel sounds are normal.     Palpations: Abdomen is soft.  Skin:    General: Skin is warm and dry.  Neurological:     General: No focal deficit present.     Mental Status: She is alert and oriented to person, place, and time.     Labs reviewed: Basic Metabolic Panel: No results for input(s): "NA", "K", "CL", "CO2", "GLUCOSE", "BUN", "CREATININE", "CALCIUM", "MG", "PHOS", "TSH" in the last 8760 hours. Liver Function Tests: No results for input(s): "AST", "ALT", "ALKPHOS", "BILITOT", "PROT", "ALBUMIN" in the last 8760 hours. No results for input(s): "LIPASE", "AMYLASE" in the last 8760 hours. No results for input(s): "AMMONIA" in the last 8760 hours. CBC: No results for input(s): "WBC", "NEUTROABS", "HGB", "HCT", "MCV", "PLT" in the last 8760 hours. Lipid Panel: No results for input(s): "CHOL", "HDL", "LDLCALC", "TRIG", "CHOLHDL", "LDLDIRECT" in the last 8760 hours. TSH: No results for input(s): "TSH" in the last 8760 hours. A1C: No results found for: "HGBA1C"   Assessment/Plan  1. Oropharyngeal candidiasis Will increase dose begin with a loading dose of 300 mg today and then continue 200 mg for 1 week with  refill for another week if effective  2. Cervical dystonia Continue with physical therapy  3. Oropharyngeal dysphagia Has appointment with ENT for further evaluation.  Endoscopy was negative   Alain Honey, MD [place] [phone]

## 2022-04-15 ENCOUNTER — Other Ambulatory Visit: Payer: Self-pay | Admitting: Family

## 2022-04-15 DIAGNOSIS — F304 Manic episode in full remission: Secondary | ICD-10-CM

## 2022-04-15 DIAGNOSIS — R1314 Dysphagia, pharyngoesophageal phase: Secondary | ICD-10-CM | POA: Diagnosis not present

## 2022-04-15 DIAGNOSIS — R41841 Cognitive communication deficit: Secondary | ICD-10-CM | POA: Diagnosis not present

## 2022-04-15 NOTE — Telephone Encounter (Signed)
Pharmacy requested refill.  Epic LR: 03/15/2022 No Contract on File, Added note to upcoming appointment.   Pended Rx and sent to [facility] for approval due to Dr. Sabra Heck out of office.

## 2022-04-16 DIAGNOSIS — R1314 Dysphagia, pharyngoesophageal phase: Secondary | ICD-10-CM | POA: Diagnosis not present

## 2022-04-16 DIAGNOSIS — R41841 Cognitive communication deficit: Secondary | ICD-10-CM | POA: Diagnosis not present

## 2022-04-18 ENCOUNTER — Other Ambulatory Visit: Payer: Self-pay | Admitting: Family Medicine

## 2022-04-18 DIAGNOSIS — B370 Candidal stomatitis: Secondary | ICD-10-CM

## 2022-04-18 MED ORDER — FLUCONAZOLE 200 MG PO TABS: 200.0000 mg | ORAL_TABLET | Freq: Every day | ORAL | 1 refills | Status: DC

## 2022-04-18 NOTE — Progress Notes (Signed)
Diflucan refilled 

## 2022-04-18 NOTE — Telephone Encounter (Addendum)
Patient called stating that she has been taking medication twice a day. Please clarify how patient is to take medication and send in refillif appropriate.  Medication pended and sent to Dr. Alain Honey and Man Bretta Bang, NP

## 2022-04-19 DIAGNOSIS — R41841 Cognitive communication deficit: Secondary | ICD-10-CM | POA: Diagnosis not present

## 2022-04-19 DIAGNOSIS — R1314 Dysphagia, pharyngoesophageal phase: Secondary | ICD-10-CM | POA: Diagnosis not present

## 2022-04-22 DIAGNOSIS — R41841 Cognitive communication deficit: Secondary | ICD-10-CM | POA: Diagnosis not present

## 2022-04-22 DIAGNOSIS — R1314 Dysphagia, pharyngoesophageal phase: Secondary | ICD-10-CM | POA: Diagnosis not present

## 2022-04-23 ENCOUNTER — Other Ambulatory Visit: Payer: Self-pay | Admitting: Family Medicine

## 2022-04-23 DIAGNOSIS — R1314 Dysphagia, pharyngoesophageal phase: Secondary | ICD-10-CM | POA: Diagnosis not present

## 2022-04-23 DIAGNOSIS — R41841 Cognitive communication deficit: Secondary | ICD-10-CM | POA: Diagnosis not present

## 2022-04-23 DIAGNOSIS — B370 Candidal stomatitis: Secondary | ICD-10-CM

## 2022-04-23 MED ORDER — FLUCONAZOLE 200 MG PO TABS: 200.0000 mg | ORAL_TABLET | Freq: Every day | ORAL | 1 refills | Status: DC

## 2022-04-26 DIAGNOSIS — H401121 Primary open-angle glaucoma, left eye, mild stage: Secondary | ICD-10-CM | POA: Diagnosis not present

## 2022-04-26 DIAGNOSIS — R41841 Cognitive communication deficit: Secondary | ICD-10-CM | POA: Diagnosis not present

## 2022-04-26 DIAGNOSIS — R1314 Dysphagia, pharyngoesophageal phase: Secondary | ICD-10-CM | POA: Diagnosis not present

## 2022-04-26 DIAGNOSIS — H401112 Primary open-angle glaucoma, right eye, moderate stage: Secondary | ICD-10-CM | POA: Diagnosis not present

## 2022-04-26 DIAGNOSIS — H47011 Ischemic optic neuropathy, right eye: Secondary | ICD-10-CM | POA: Diagnosis not present

## 2022-04-26 DIAGNOSIS — H16223 Keratoconjunctivitis sicca, not specified as Sjogren's, bilateral: Secondary | ICD-10-CM | POA: Diagnosis not present

## 2022-04-29 DIAGNOSIS — R41841 Cognitive communication deficit: Secondary | ICD-10-CM | POA: Diagnosis not present

## 2022-04-29 DIAGNOSIS — R1314 Dysphagia, pharyngoesophageal phase: Secondary | ICD-10-CM | POA: Diagnosis not present

## 2022-04-30 DIAGNOSIS — R41841 Cognitive communication deficit: Secondary | ICD-10-CM | POA: Diagnosis not present

## 2022-04-30 DIAGNOSIS — R1314 Dysphagia, pharyngoesophageal phase: Secondary | ICD-10-CM | POA: Diagnosis not present

## 2022-05-06 ENCOUNTER — Other Ambulatory Visit: Payer: Self-pay | Admitting: Family Medicine

## 2022-05-06 DIAGNOSIS — R41841 Cognitive communication deficit: Secondary | ICD-10-CM | POA: Diagnosis not present

## 2022-05-06 DIAGNOSIS — R1314 Dysphagia, pharyngoesophageal phase: Secondary | ICD-10-CM | POA: Diagnosis not present

## 2022-05-06 NOTE — Telephone Encounter (Signed)
Pharmacy requested refill.   Pended Rx and sent to [facility] for approval due to HIGH ALERT Warning. (Dr. Miller out of office) 

## 2022-05-07 DIAGNOSIS — R41841 Cognitive communication deficit: Secondary | ICD-10-CM | POA: Diagnosis not present

## 2022-05-07 DIAGNOSIS — R1314 Dysphagia, pharyngoesophageal phase: Secondary | ICD-10-CM | POA: Diagnosis not present

## 2022-05-09 ENCOUNTER — Encounter: Payer: Self-pay | Admitting: Nurse Practitioner

## 2022-05-09 ENCOUNTER — Non-Acute Institutional Stay: Payer: Medicare PPO | Admitting: Nurse Practitioner

## 2022-05-09 VITALS — BP 118/78 | HR 67 | Temp 95.6°F | Ht 61.0 in | Wt 118.6 lb

## 2022-05-09 DIAGNOSIS — M79675 Pain in left toe(s): Secondary | ICD-10-CM | POA: Diagnosis not present

## 2022-05-09 DIAGNOSIS — L84 Corns and callosities: Secondary | ICD-10-CM | POA: Diagnosis not present

## 2022-05-09 DIAGNOSIS — R1312 Dysphagia, oropharyngeal phase: Secondary | ICD-10-CM

## 2022-05-09 DIAGNOSIS — F304 Manic episode in full remission: Secondary | ICD-10-CM | POA: Diagnosis not present

## 2022-05-09 DIAGNOSIS — G243 Spasmodic torticollis: Secondary | ICD-10-CM

## 2022-05-09 DIAGNOSIS — I10 Essential (primary) hypertension: Secondary | ICD-10-CM | POA: Diagnosis not present

## 2022-05-09 DIAGNOSIS — R1314 Dysphagia, pharyngoesophageal phase: Secondary | ICD-10-CM | POA: Diagnosis not present

## 2022-05-09 DIAGNOSIS — R41841 Cognitive communication deficit: Secondary | ICD-10-CM | POA: Diagnosis not present

## 2022-05-09 DIAGNOSIS — B370 Candidal stomatitis: Secondary | ICD-10-CM

## 2022-05-09 MED ORDER — FLUCONAZOLE 200 MG PO TABS: 200.0000 mg | ORAL_TABLET | Freq: Every day | ORAL | 1 refills | Status: DC

## 2022-05-09 NOTE — Assessment & Plan Note (Signed)
blood pressure is controlled on Losartan 

## 2022-05-09 NOTE — Assessment & Plan Note (Signed)
C/o the left 2nd toe pain after a electric w/c run over her toe, no bruise, deformity, no pain with PROM upon my examination.

## 2022-05-09 NOTE — Assessment & Plan Note (Signed)
Persisted running nose, facial pressure, sore throat, pending ENT evaluation.

## 2022-05-09 NOTE — Assessment & Plan Note (Signed)
stable, on Lithium, GDR due to bradycardia, takes Alprazolam too, TSH 2.08 04/09/21. Dr Miller started low dose of Depakote. Lithium level 0.7 12/20/21 

## 2022-05-09 NOTE — Assessment & Plan Note (Signed)
05/09/22 medical left MTJ callous, plantar aspect 3rd toe callous, podiatry to eval/tx.

## 2022-05-09 NOTE — Assessment & Plan Note (Signed)
Cervical degenerative changes/dystonia, better, CT/MRI 12/2019 ruled out acute process, aches in neck R>L travels to the back of head, comes and goes, not disabling, f/u Neurosurgery. Failed Cymbalta, Gabapentin, Robaxin, Ultracet. s/p Botulinum Toxin inj by Neurology. ESR 9, CRP 2.7 10/09/20. The patient stated Alprazolam and Tylenol are kind of effective. Improved on Topamax, but associated with weight loss.  No Parkinson's disease, took her off Sinemet per Neurology.  

## 2022-05-09 NOTE — Assessment & Plan Note (Addendum)
recurrent thrush identified on swallow study, FEES showed diffused white secretion coating areas of white plaques throughout pharynx. The patient c/o sore in buccal sides of tongue and in her throat when swallows. Treated with Diflucan in the past, underwent GI evaluation, EGD 03/25/22 normal findings. Last treated with Diflucan  x1 04/10/22, then followed  qd x1 wk, may repeat x1.  05/09/22 repeat Diflucan  qd x 1week, may repeat x1

## 2022-05-09 NOTE — Progress Notes (Signed)
Location:   [facility]   Place of Service:  Clinic (12) Provider: Chipper Oman NP  Code Status: DNR Goals of Care:     04/04/2022    1:18 PM  Advanced Directives  Does Patient Have a Medical Advance Directive? Yes  Type of Estate agent of [employer];Living will  Does patient want to make changes to medical advance directive? No - Patient declined  Copy of Healthcare Power of Attorney in Chart? Yes - validated most recent copy scanned in chart (See row information)     Chief Complaint  Patient presents with   Medical Management of Chronic Issues    Patient presents today for a 5 week follow-up   Quality Metric Gaps    TDAP, COVID#5    HPI: Patient is a 82 y.o. female seen today for medical management of chronic diseases.      Persisted running nose, facial pressure, sore throat, pending ENT evaluation.    C/o the left 2nd toe pain after a electric w/c run over her toe, no bruise, deformity, no pain with PROM upon my examination.               Esophageal candidiasis, recurrent thrush identified on swallow study, FEES showed diffused white secretion coating areas of white plaques throughout pharynx. The patient c/o sore in buccal sides of tongue and in her throat when swallows. Treated with Diflucan in the past, underwent GI evaluation, EGD 03/25/22 normal findings. Last treated with Diflucan 300mg  x1 04/10/22, then followed 200mg  qd x1 wk, may repeat x1.                Edema, LLE negative DVT venous US, L>R. Medial thickened swelling area about her palm sized, mild warmth, redness, and indurated area for a few month. Varicose veins LLE not new.  Tinnitus, underwent eval of  ENT, suggested music background.              Hx of GIST of small intestine s/p resection. MiraLax, Psyllium              Dysphagia, GI evaluated, EGD 03/25/22 wnl, working with ST, thicken liquids.              Constipation, Metamucil, MiraLax,  effective.              GERD, stable, on  Omeprazole, Hgb 12.8 04/09/21             HTN, blood pressure is controlled on Losartan             Bipolar disorder, stable, on Lithium, GDR due to bradycardia, takes Alprazolam too, TSH 2.08 04/09/21. Dr Hyacinth Meeker started low dose of Depakote. Lithium level 0.7 12/20/21             Bradycardia, improved from 40s to 50s. Hx of normalized HR after Tylenol PM dc'd, GDR of Lithium helped too.              Hypercalcemia, f/u endocrinology, Ca 10.7 04/09/21,  PTH 79 01/12/20. GDR of Lithium may help             OA, takes  Tylenol, s/p R knee arthroplasty.             Cervical degenerative changes/dystonia, better, CT/MRI 12/2019 ruled out acute process, aches in neck R>L travels to the back of head, comes and goes, not disabling, f/u Neurosurgery. Failed Cymbalta, Gabapentin, Robaxin, Ultracet. s/p Botulinum Toxin inj by Neurology. ESR 9, CRP 2.7  10/09/20. The patient stated Alprazolam and Tylenol are kind of effective. Improved on Topamax, but associated with weight loss.  No Parkinson's disease, took her off Sinemet per Neurology.              Cognitive impairment: 12/2019 MRI brain showed chronic microvascular ischemic changes, no acute disease with mild cortical atrophy, TSH 2.08 04/09/21             Hyperlipidemia, LDL 166 04/09/21, on diet, allergic to statin.  Past Medical History:  Diagnosis Date   Anemia    Anxiety    Arthritis    Cardiac conduction disorder 03/30/2012   Overview:  STORY: ETT 03/09/2012 Echo 03/07/2012 normal Dr Jacinto Halim, bradycardia felt due to glaucoma eye drops   Cervical dystonia    Diverticulosis of colon    DOE (dyspnea on exertion) 03/25/2018   Gastroesophageal cancer (HCC)    GERD (gastroesophageal reflux disease)    GIST (gastrointestinal stroma tumor), malignant, colon (HCC)    Glaucoma    Heart murmur    History of colon polyps 10/24/2008   Hypertension    Major neurocognitive disorder due to Parkinson's disease, possible    Tremors possible parkinsons   Manic disorder,  single episode, in full remission (HCC) 12/01/2009   Sixth nerve palsy     Past Surgical History:  Procedure Laterality Date   BILATERAL SALPINGOOPHORECTOMY  09/22/2007   CATARACT EXTRACTION Bilateral    ESOPHAGOGASTRODUODENOSCOPY (EGD) WITH PROPOFOL N/A 03/25/2022   Procedure: ESOPHAGOGASTRODUODENOSCOPY (EGD) WITH PROPOFOL;  Surgeon: Sherrilyn Rist, MD;  Location: [facility];  Service: Gastroenterology;  Laterality: N/A;   Gastrointestinal Stroma Tumor,  Other  1960   GIST Surgery   ILEOCECETOMY  09/22/2007   OVARIAN CYST REMOVAL Right 1967   SMALL INTESTINE SURGERY  09/22/2007   TOTAL KNEE ARTHROPLASTY Right 10/05/2019   Procedure: RIGHT TOTAL KNEE ARTHROPLASTY;  Surgeon: Cammy Copa, MD;  Location: [facility] OR;  Service: Orthopedics;  Laterality: Right;   TOTAL VAGINAL HYSTERECTOMY  1986   Fibroids    Allergies  Allergen Reactions   Lisinopril Cough   Penicillins Itching    50 years ago   Adhesive [Tape] Itching   Atorvastatin Itching   Dilaudid [Hydromorphone Hcl] Itching   Hydromorphone Itching    Allergies as of 05/09/2022       Reactions   Lisinopril Cough   Penicillins Itching   50 years ago   Adhesive [tape] Itching   Atorvastatin Itching   Dilaudid [hydromorphone Hcl] Itching   Hydromorphone Itching        Medication List        Accurate as of May 09, 2022 11:59 PM. If you have any questions, ask your nurse or doctor.          ALPRAZolam 0.5 MG tablet Commonly known as: XANAX TAKE ONE TABLET BY MOUTH TWICE DAILY AS NEEDED FOR ANXIETY   aspirin 81 MG chewable tablet Chew 81 mg by mouth daily.   Biotene Dry Mouth Lozg Use as directed 1 tablet in the mouth or throat 4 (four) times daily as needed.   bismuth subsalicylate 262 MG/15ML suspension Commonly known as: PEPTO BISMOL Take 30 mLs by mouth every 6 (six) hours as needed for diarrhea or loose stools.   clindamycin 150 MG capsule Commonly known as: CLEOCIN Take 600mg  1 hour  prior to dental procedure   fluconazole 200 MG tablet Commonly known as: DIFLUCAN Take 1 tablet (200 mg total) by mouth daily. What changed: Another  medication with the same name was added. Make sure you understand how and when to take each. Changed by: Demetrice Amstutz X Lavera Vandermeer, NP   fluconazole 200 MG tablet Commonly known as: DIFLUCAN Take 1 tablet (200 mg total) by mouth daily. What changed: You were already taking a medication with the same name, and this prescription was added. Make sure you understand how and when to take each. Changed by: Shiloh Swopes X Caileen Veracruz, NP   latanoprost 0.005 % ophthalmic solution Commonly known as: XALATAN Place 1 drop into both eyes at bedtime.   lithium carbonate 150 MG capsule Take 150 mg by mouth 3 (three) times daily with meals.   losartan 50 MG tablet Commonly known as: COZAAR TAKE ONE TABLET BY MOUTH ONCE DAILY   MAGNESIUM PO Take 400 mg by mouth daily.   multivitamin tablet Take 1 tablet by mouth daily.   polyethylene glycol 17 g packet Commonly known as: MIRALAX / GLYCOLAX Take 17 g by mouth daily.   Restasis 0.05 % ophthalmic emulsion Generic drug: cycloSPORINE Place 1 drop into both eyes 2 (two) times daily.   topiramate 25 MG tablet Commonly known as: TOPAMAX TAKE ONE TABLET BY MOUTH TWICE DAILY IN THE MORNING AND IN THE EVENING   triamcinolone ointment 0.5 % Commonly known as: KENALOG Apply 1 Application topically 2 (two) times daily as needed (place on leg).        Review of Systems:  Review of Systems  Constitutional:  Negative for appetite change, fatigue and fever.  HENT:  Positive for hearing loss, mouth sores, postnasal drip, rhinorrhea, sinus pressure, sore throat, tinnitus and trouble swallowing. Negative for congestion, facial swelling and voice change.        C/o sore buccal aspect tongue and in throat when swallows.   Eyes:  Negative for visual disturbance.       Glaucoma, diplopia R+L lateral peripheral visual fields only.    Respiratory:  Negative for shortness of breath.        Occasionally DOE, hacking cough  Cardiovascular:  Positive for leg swelling.  Gastrointestinal:  Negative for abdominal pain and constipation.  Genitourinary:  Positive for frequency. Negative for dysuria and urgency.       Couple of times at night, no difficulty of returning asleep.   Musculoskeletal:  Positive for arthralgias and gait problem.       Walker.   Skin:  Negative for color change.  Neurological:  Negative for tremors, speech difficulty, light-headedness and headaches.       Comes and goes nature of the neck/occipital pain. Stiff neck. Pain the patent right knee, s/p TKR. Tremor is better since Sinemet.   Psychiatric/Behavioral:  Positive for sleep disturbance. Negative for confusion. The patient is nervous/anxious.        Better mood, feels not as anxious or depressive mood as prior.     Health Maintenance  Topic Date Due   DTaP/Tdap/Td (2 - Td or Tdap) 08/19/2021   COVID-19 Vaccine (5 - 2023-24 season) 01/10/2022   INFLUENZA VACCINE  08/15/2022   Medicare Annual Wellness (AWV)  02/08/2023   Pneumonia Vaccine 87+ Years old  Completed   DEXA SCAN  Completed   Zoster Vaccines- Shingrix  Completed   HPV VACCINES  Aged Out    Physical Exam: Vitals:   05/09/22 1408  BP: 118/78  Pulse: 67  Temp: (!) 95.6 F (35.3 C)  SpO2: 99%  Weight: 118 lb 9.6 oz (53.8 kg)  Height: 5\' 1"  (1.549 m)  Body mass index is 22.41 kg/m. Physical Exam Vitals and nursing note reviewed.  Constitutional:      Appearance: Normal appearance.  HENT:     Head: Normocephalic and atraumatic.     Nose: Rhinorrhea present. No congestion.     Mouth/Throat:     Mouth: Mucous membranes are moist.     Pharynx: No oropharyngeal exudate or posterior oropharyngeal erythema.     Comments: Slight red areas on tongue, no heavy yellow or white coating.  Eyes:     Extraocular Movements: Extraocular movements intact.     Conjunctiva/sclera:  Conjunctivae normal.     Pupils: Pupils are equal, round, and reactive to light.  Cardiovascular:     Rate and Rhythm: Regular rhythm. Bradycardia present.     Heart sounds: Murmur heard.     Comments: DP pulses present R+L. HR in 50s Pulmonary:     Effort: Pulmonary effort is normal.     Breath sounds: No rales.  Abdominal:     General: Bowel sounds are normal.     Palpations: Abdomen is soft.     Tenderness: There is no abdominal tenderness.  Musculoskeletal:     Cervical back: Normal range of motion and neck supple.     Right lower leg: Edema present.     Left lower leg: Edema present.     Comments: Chronic R knee pain is improved. S/p TKR right. Stiff neck, comes and goes neck/occipital pain. Trace edema BLE  Skin:    General: Skin is warm and dry.     Comments: Improved the medial lower left leg a palm sized thickened, slightly reddened area, mild discomfort when palpated. DP present left foot. Scattered varicose veins left leg 05/09/22 medical left MTJ callous, plantar aspect 3rd toe callous, the left 2nd toe pain after injury, but no bruise, deformity, swelling, redness, able to PROM w/o pain.    Neurological:     General: No focal deficit present.     Mental Status: She is alert and oriented to person, place, and time. Mental status is at baseline.     Motor: No weakness.     Coordination: Coordination abnormal.     Gait: Gait abnormal.     Comments: Resting tremor in fingers, near resolution.   Psychiatric:        Mood and Affect: Mood normal.        Behavior: Behavior normal.        Thought Content: Thought content normal.     Labs reviewed: Basic Metabolic Panel: No results for input(s): "NA", "K", "CL", "CO2", "GLUCOSE", "BUN", "CREATININE", "CALCIUM", "MG", "PHOS", "TSH" in the last 8760 hours. Liver Function Tests: No results for input(s): "AST", "ALT", "ALKPHOS", "BILITOT", "PROT", "ALBUMIN" in the last 8760 hours. No results for input(s): "LIPASE", "AMYLASE"  in the last 8760 hours. No results for input(s): "AMMONIA" in the last 8760 hours. CBC: No results for input(s): "WBC", "NEUTROABS", "HGB", "HCT", "MCV", "PLT" in the last 8760 hours. Lipid Panel: No results for input(s): "CHOL", "HDL", "LDLCALC", "TRIG", "CHOLHDL", "LDLDIRECT" in the last 8760 hours. No results found for: "HGBA1C"  Procedures since last visit: No results found.  Assessment/Plan  Oropharyngeal candidiasis recurrent thrush identified on swallow study, FEES showed diffused white secretion coating areas of white plaques throughout pharynx. The patient c/o sore in buccal sides of tongue and in her throat when swallows. Treated with Diflucan in the past, underwent GI evaluation, EGD 03/25/22 normal findings. Last treated with Diflucan 300mg  x1 04/10/22, then  followed 200mg  qd x1 wk, may repeat x1.  05/09/22 repeat Diflucan 200mg  qd x 1week, may repeat x1  Dysphagia  Persisted running nose, facial pressure, sore throat, pending ENT evaluation.   Essential (primary) hypertension blood pressure is controlled on Losartan  Bipolar I disorder, single manic episode, in full remission (HCC) stable, on Lithium, GDR due to bradycardia, takes Alprazolam too, TSH 2.08 04/09/21. Dr Hyacinth Meeker started low dose of Depakote. Lithium level 0.7 12/20/21  Cervical dystonia Cervical degenerative changes/dystonia, better, CT/MRI 12/2019 ruled out acute process, aches in neck R>L travels to the back of head, comes and goes, not disabling, f/u Neurosurgery. Failed Cymbalta, Gabapentin, Robaxin, Ultracet. s/p Botulinum Toxin inj by Neurology. ESR 9, CRP 2.7 10/09/20. The patient stated Alprazolam and Tylenol are kind of effective. Improved on Topamax, but associated with weight loss.  No Parkinson's disease, took her off Sinemet per Neurology.   Corn of foot 05/09/22 medical left MTJ callous, plantar aspect 3rd toe callous, podiatry to eval/tx.   Toe pain, left C/o the left 2nd toe pain after a electric  w/c run over her toe, no bruise, deformity, no pain with PROM upon my examination.    Labs/tests ordered:  none  Next appt:  2 months.

## 2022-05-10 ENCOUNTER — Encounter: Payer: Self-pay | Admitting: Nurse Practitioner

## 2022-05-10 DIAGNOSIS — J312 Chronic pharyngitis: Secondary | ICD-10-CM | POA: Diagnosis not present

## 2022-05-10 DIAGNOSIS — B370 Candidal stomatitis: Secondary | ICD-10-CM | POA: Diagnosis not present

## 2022-05-10 DIAGNOSIS — J310 Chronic rhinitis: Secondary | ICD-10-CM | POA: Diagnosis not present

## 2022-05-13 ENCOUNTER — Other Ambulatory Visit: Payer: Self-pay | Admitting: Family Medicine

## 2022-05-13 ENCOUNTER — Other Ambulatory Visit: Payer: Self-pay | Admitting: Nurse Practitioner

## 2022-05-13 DIAGNOSIS — R1314 Dysphagia, pharyngoesophageal phase: Secondary | ICD-10-CM | POA: Diagnosis not present

## 2022-05-13 DIAGNOSIS — R41841 Cognitive communication deficit: Secondary | ICD-10-CM | POA: Diagnosis not present

## 2022-05-14 DIAGNOSIS — R41841 Cognitive communication deficit: Secondary | ICD-10-CM | POA: Diagnosis not present

## 2022-05-14 DIAGNOSIS — R1314 Dysphagia, pharyngoesophageal phase: Secondary | ICD-10-CM | POA: Diagnosis not present

## 2022-05-16 ENCOUNTER — Telehealth: Payer: Self-pay

## 2022-05-16 ENCOUNTER — Other Ambulatory Visit: Payer: Self-pay | Admitting: Neurology

## 2022-05-16 DIAGNOSIS — R41841 Cognitive communication deficit: Secondary | ICD-10-CM | POA: Diagnosis not present

## 2022-05-16 DIAGNOSIS — R1314 Dysphagia, pharyngoesophageal phase: Secondary | ICD-10-CM | POA: Diagnosis not present

## 2022-05-16 DIAGNOSIS — G243 Spasmodic torticollis: Secondary | ICD-10-CM

## 2022-05-16 DIAGNOSIS — M6281 Muscle weakness (generalized): Secondary | ICD-10-CM | POA: Diagnosis not present

## 2022-05-16 DIAGNOSIS — R29898 Other symptoms and signs involving the musculoskeletal system: Secondary | ICD-10-CM | POA: Diagnosis not present

## 2022-05-16 NOTE — Telephone Encounter (Signed)
I received a refill request for this patients 300 units of Botox with a note that it was discontinued due to being ineffective. I called patient and she confirmed that she does not feel like it is working and she wants to discontinue her Botox at our office. I am cancelling her appointments

## 2022-05-21 DIAGNOSIS — M6281 Muscle weakness (generalized): Secondary | ICD-10-CM | POA: Diagnosis not present

## 2022-05-21 DIAGNOSIS — R41841 Cognitive communication deficit: Secondary | ICD-10-CM | POA: Diagnosis not present

## 2022-05-21 DIAGNOSIS — R29898 Other symptoms and signs involving the musculoskeletal system: Secondary | ICD-10-CM | POA: Diagnosis not present

## 2022-05-21 DIAGNOSIS — R1314 Dysphagia, pharyngoesophageal phase: Secondary | ICD-10-CM | POA: Diagnosis not present

## 2022-05-23 DIAGNOSIS — R29898 Other symptoms and signs involving the musculoskeletal system: Secondary | ICD-10-CM | POA: Diagnosis not present

## 2022-05-23 DIAGNOSIS — M6281 Muscle weakness (generalized): Secondary | ICD-10-CM | POA: Diagnosis not present

## 2022-05-23 DIAGNOSIS — R41841 Cognitive communication deficit: Secondary | ICD-10-CM | POA: Diagnosis not present

## 2022-05-23 DIAGNOSIS — R1314 Dysphagia, pharyngoesophageal phase: Secondary | ICD-10-CM | POA: Diagnosis not present

## 2022-05-27 ENCOUNTER — Other Ambulatory Visit: Payer: Self-pay | Admitting: Nurse Practitioner

## 2022-05-27 DIAGNOSIS — R41841 Cognitive communication deficit: Secondary | ICD-10-CM | POA: Diagnosis not present

## 2022-05-27 DIAGNOSIS — R1314 Dysphagia, pharyngoesophageal phase: Secondary | ICD-10-CM | POA: Diagnosis not present

## 2022-05-27 DIAGNOSIS — R29898 Other symptoms and signs involving the musculoskeletal system: Secondary | ICD-10-CM | POA: Diagnosis not present

## 2022-05-27 DIAGNOSIS — M6281 Muscle weakness (generalized): Secondary | ICD-10-CM | POA: Diagnosis not present

## 2022-05-28 DIAGNOSIS — R1314 Dysphagia, pharyngoesophageal phase: Secondary | ICD-10-CM | POA: Diagnosis not present

## 2022-05-28 DIAGNOSIS — R41841 Cognitive communication deficit: Secondary | ICD-10-CM | POA: Diagnosis not present

## 2022-05-28 DIAGNOSIS — R29898 Other symptoms and signs involving the musculoskeletal system: Secondary | ICD-10-CM | POA: Diagnosis not present

## 2022-05-28 DIAGNOSIS — M6281 Muscle weakness (generalized): Secondary | ICD-10-CM | POA: Diagnosis not present

## 2022-05-29 ENCOUNTER — Telehealth: Payer: Self-pay | Admitting: Neurology

## 2022-05-29 IMAGING — CT CT HEAD W/O CM
3 series · 15 of 47 positions shown, 18 images · non-contrast
Comparison: None.

CLINICAL DATA: Altered mental status.  Headache.

EXAM:
CT HEAD WITHOUT CONTRAST
TECHNIQUE: Contiguous axial images were obtained from the base of the skull
through the vertex without intravenous contrast.

[Series 2: head wo · axial · 0.47mm/px · z∈[-171,-41]mm · 9 of 32 slices shown, 12 images]
[im 3/32  brain]
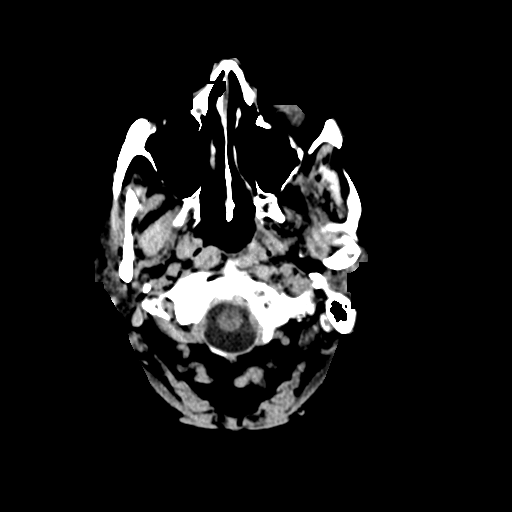
[im 3/32  bone]
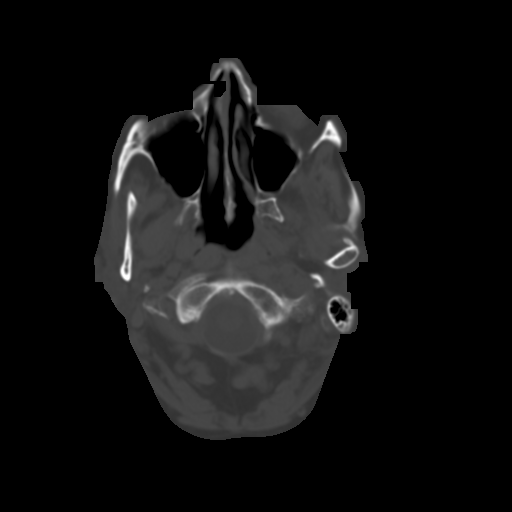
[im 6/32  brain]
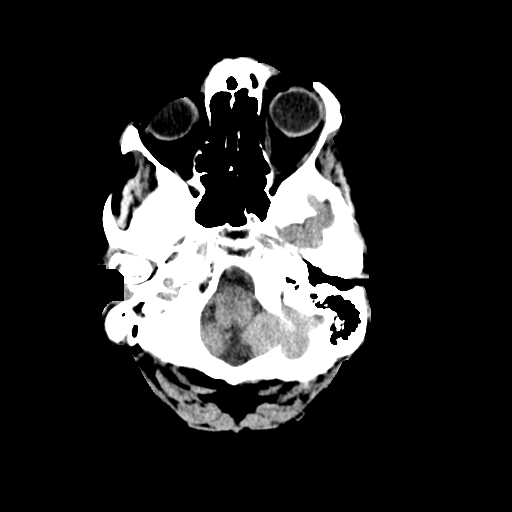
[im 9/32  brain]
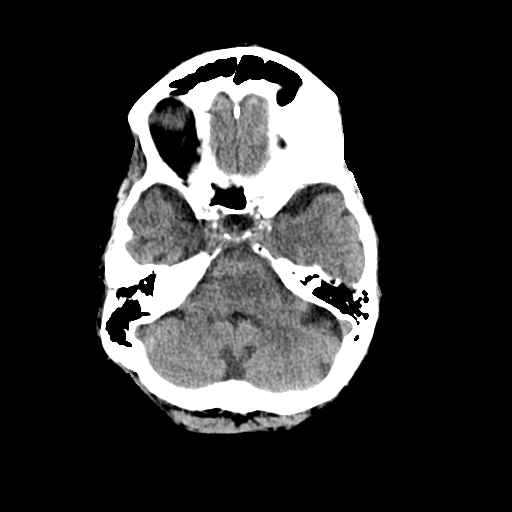
[im 12/32  brain]
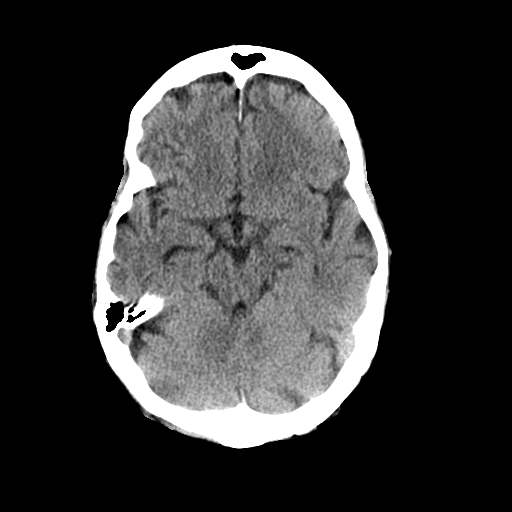
[im 17/32  brain]
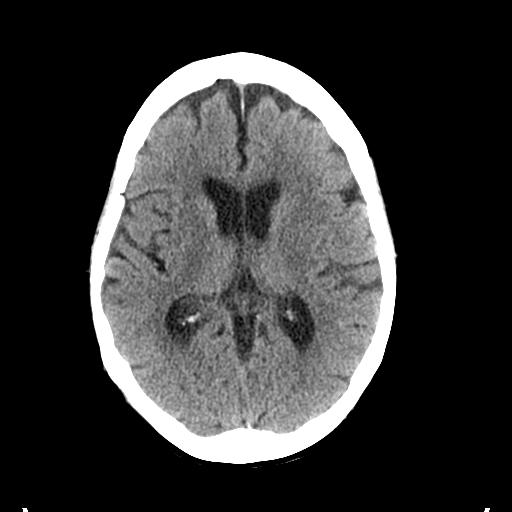
[im 17/32  bone]
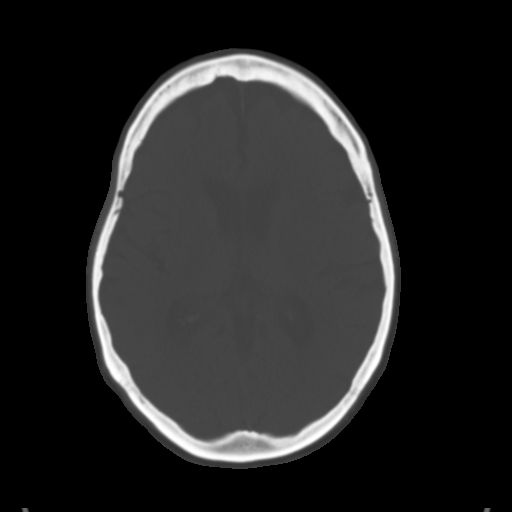
[im 20/32  brain]
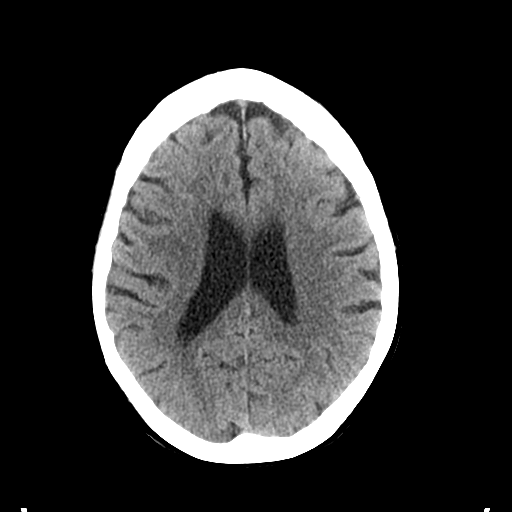
[im 23/32  brain]
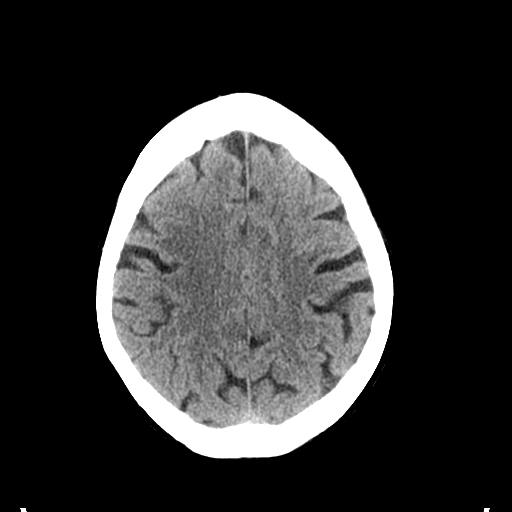
[im 26/32  brain]
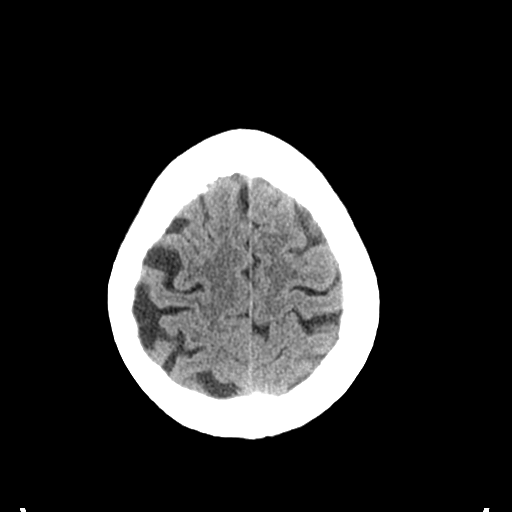
[im 29/32  brain]
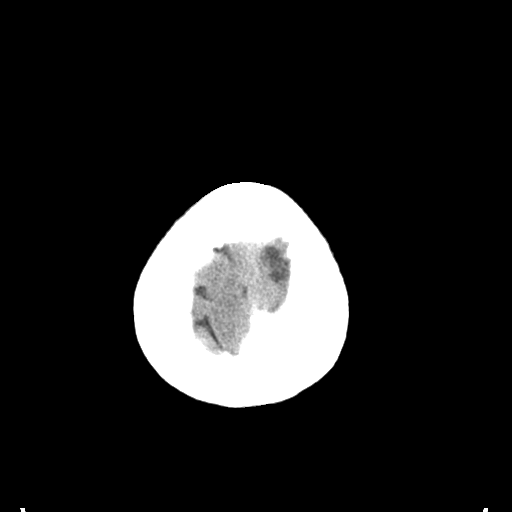
[im 29/32  bone]
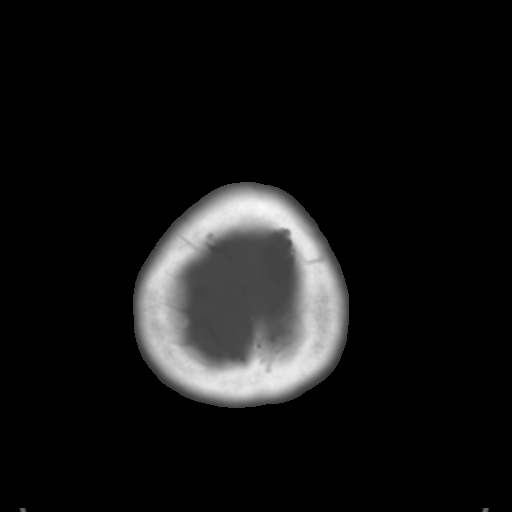

[Series 5: coronal soft tissue · coronal · 0.31mm/px · 3 of 68 slices shown]
[im 23/68  brain]
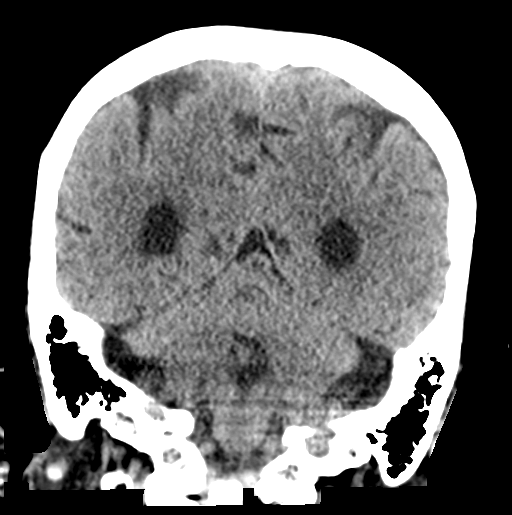
[im 30/68  brain]
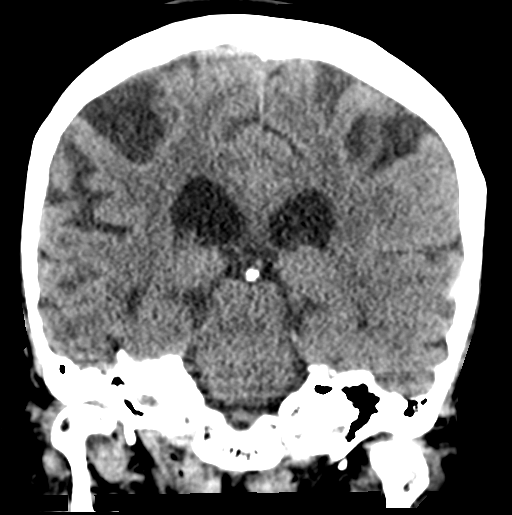
[im 38/68  brain]
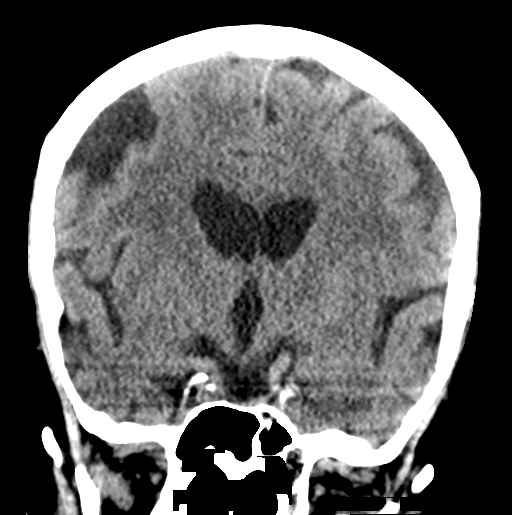

[Series 6: sagittal soft tissue · sagittal · 0.30mm/px · 3 of 54 slices shown]
[im 18/54  brain]
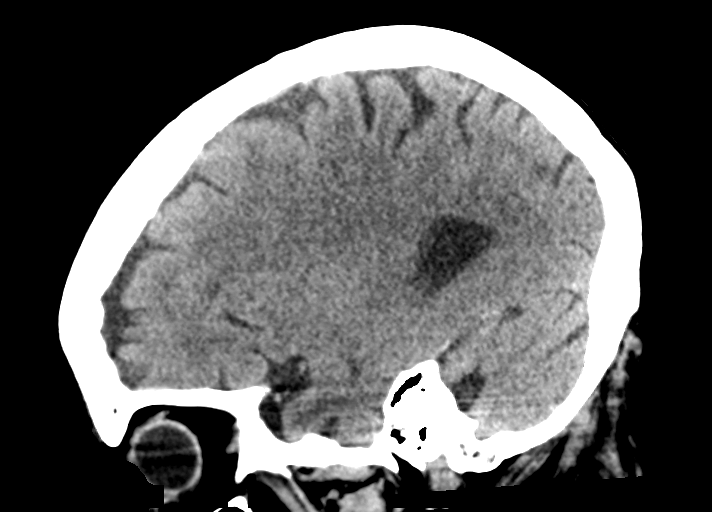
[im 27/54  brain]
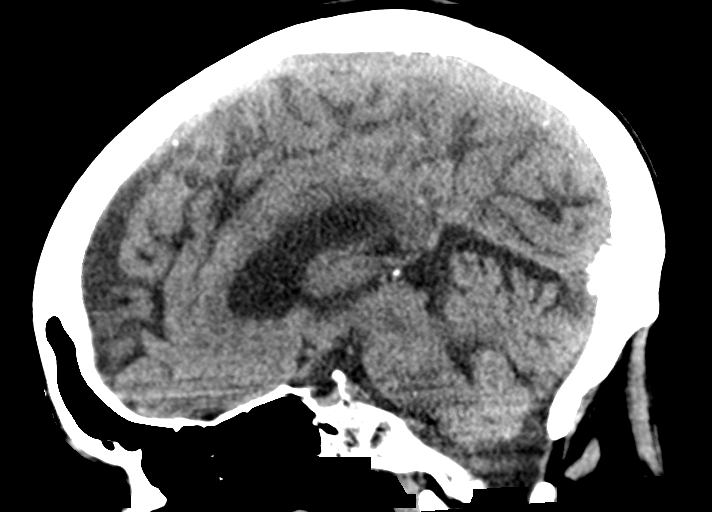
[im 36/54  brain]
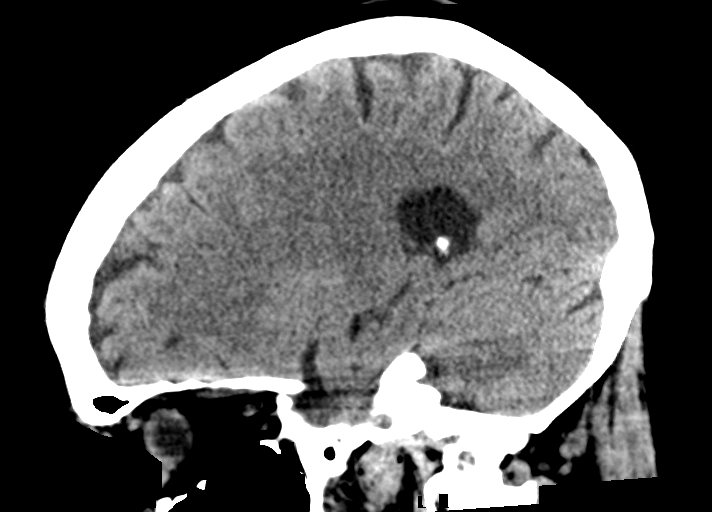

[15 of 47 positions shown; findings below may reference images not displayed]

FINDINGS: Brain: Mild diffuse cortical atrophy is noted. Mild chronic ischemic
white matter disease is noted. No mass effect or midline shift is
noted. Ventricular size is within normal limits. There is no
evidence of mass lesion, hemorrhage or acute infarction.

Vascular: No hyperdense vessel or unexpected calcification.

Skull: Normal. Negative for fracture or focal lesion.

Sinuses/Orbits: No acute finding.

Other: None.
IMPRESSION: Mild diffuse cortical atrophy. Mild chronic ischemic white matter
disease. No acute intracranial abnormality seen.

## 2022-05-29 NOTE — Telephone Encounter (Signed)
Patient needs to know if she is suppose to see Dr Tat for follow up care since she is no longer doing the Botox  here any longer. She is not sure what she is supposed to do

## 2022-05-30 DIAGNOSIS — R29898 Other symptoms and signs involving the musculoskeletal system: Secondary | ICD-10-CM | POA: Diagnosis not present

## 2022-05-30 DIAGNOSIS — R1314 Dysphagia, pharyngoesophageal phase: Secondary | ICD-10-CM | POA: Diagnosis not present

## 2022-05-30 DIAGNOSIS — M6281 Muscle weakness (generalized): Secondary | ICD-10-CM | POA: Diagnosis not present

## 2022-05-30 DIAGNOSIS — R41841 Cognitive communication deficit: Secondary | ICD-10-CM | POA: Diagnosis not present

## 2022-05-30 NOTE — Telephone Encounter (Signed)
Called patient and let her know no need to follow up at this time but to call if she wants to restart Botox

## 2022-05-31 DIAGNOSIS — H401121 Primary open-angle glaucoma, left eye, mild stage: Secondary | ICD-10-CM | POA: Diagnosis not present

## 2022-05-31 DIAGNOSIS — H401112 Primary open-angle glaucoma, right eye, moderate stage: Secondary | ICD-10-CM | POA: Diagnosis not present

## 2022-05-31 DIAGNOSIS — H16223 Keratoconjunctivitis sicca, not specified as Sjogren's, bilateral: Secondary | ICD-10-CM | POA: Diagnosis not present

## 2022-06-03 ENCOUNTER — Other Ambulatory Visit: Payer: Self-pay | Admitting: Nurse Practitioner

## 2022-06-03 DIAGNOSIS — R41841 Cognitive communication deficit: Secondary | ICD-10-CM | POA: Diagnosis not present

## 2022-06-03 DIAGNOSIS — R29898 Other symptoms and signs involving the musculoskeletal system: Secondary | ICD-10-CM | POA: Diagnosis not present

## 2022-06-03 DIAGNOSIS — M6281 Muscle weakness (generalized): Secondary | ICD-10-CM | POA: Diagnosis not present

## 2022-06-03 DIAGNOSIS — R1314 Dysphagia, pharyngoesophageal phase: Secondary | ICD-10-CM | POA: Diagnosis not present

## 2022-06-03 DIAGNOSIS — F304 Manic episode in full remission: Secondary | ICD-10-CM

## 2022-06-03 NOTE — Telephone Encounter (Signed)
Patient has request refill on medication Alprazolam 0.5mg . Patient medication last refilled 04/15/2022. Medication pend and sent to PCP Hyacinth Meeker Bertram Millard, MD for approval.

## 2022-06-04 DIAGNOSIS — R1314 Dysphagia, pharyngoesophageal phase: Secondary | ICD-10-CM | POA: Diagnosis not present

## 2022-06-04 DIAGNOSIS — R29898 Other symptoms and signs involving the musculoskeletal system: Secondary | ICD-10-CM | POA: Diagnosis not present

## 2022-06-04 DIAGNOSIS — R41841 Cognitive communication deficit: Secondary | ICD-10-CM | POA: Diagnosis not present

## 2022-06-04 DIAGNOSIS — M6281 Muscle weakness (generalized): Secondary | ICD-10-CM | POA: Diagnosis not present

## 2022-06-06 DIAGNOSIS — R41841 Cognitive communication deficit: Secondary | ICD-10-CM | POA: Diagnosis not present

## 2022-06-06 DIAGNOSIS — R29898 Other symptoms and signs involving the musculoskeletal system: Secondary | ICD-10-CM | POA: Diagnosis not present

## 2022-06-06 DIAGNOSIS — R1314 Dysphagia, pharyngoesophageal phase: Secondary | ICD-10-CM | POA: Diagnosis not present

## 2022-06-06 DIAGNOSIS — M6281 Muscle weakness (generalized): Secondary | ICD-10-CM | POA: Diagnosis not present

## 2022-06-10 DIAGNOSIS — R1314 Dysphagia, pharyngoesophageal phase: Secondary | ICD-10-CM | POA: Diagnosis not present

## 2022-06-10 DIAGNOSIS — R29898 Other symptoms and signs involving the musculoskeletal system: Secondary | ICD-10-CM | POA: Diagnosis not present

## 2022-06-10 DIAGNOSIS — M6281 Muscle weakness (generalized): Secondary | ICD-10-CM | POA: Diagnosis not present

## 2022-06-10 DIAGNOSIS — R41841 Cognitive communication deficit: Secondary | ICD-10-CM | POA: Diagnosis not present

## 2022-06-11 DIAGNOSIS — R41841 Cognitive communication deficit: Secondary | ICD-10-CM | POA: Diagnosis not present

## 2022-06-11 DIAGNOSIS — R29898 Other symptoms and signs involving the musculoskeletal system: Secondary | ICD-10-CM | POA: Diagnosis not present

## 2022-06-11 DIAGNOSIS — M6281 Muscle weakness (generalized): Secondary | ICD-10-CM | POA: Diagnosis not present

## 2022-06-11 DIAGNOSIS — R1314 Dysphagia, pharyngoesophageal phase: Secondary | ICD-10-CM | POA: Diagnosis not present

## 2022-06-14 ENCOUNTER — Ambulatory Visit: Payer: Medicare PPO | Admitting: Neurology

## 2022-06-14 DIAGNOSIS — R41841 Cognitive communication deficit: Secondary | ICD-10-CM | POA: Diagnosis not present

## 2022-06-14 DIAGNOSIS — M6281 Muscle weakness (generalized): Secondary | ICD-10-CM | POA: Diagnosis not present

## 2022-06-14 DIAGNOSIS — R29898 Other symptoms and signs involving the musculoskeletal system: Secondary | ICD-10-CM | POA: Diagnosis not present

## 2022-06-14 DIAGNOSIS — R1314 Dysphagia, pharyngoesophageal phase: Secondary | ICD-10-CM | POA: Diagnosis not present

## 2022-06-18 DIAGNOSIS — R29898 Other symptoms and signs involving the musculoskeletal system: Secondary | ICD-10-CM | POA: Diagnosis not present

## 2022-06-18 DIAGNOSIS — M6281 Muscle weakness (generalized): Secondary | ICD-10-CM | POA: Diagnosis not present

## 2022-06-18 DIAGNOSIS — R41841 Cognitive communication deficit: Secondary | ICD-10-CM | POA: Diagnosis not present

## 2022-06-18 DIAGNOSIS — R1314 Dysphagia, pharyngoesophageal phase: Secondary | ICD-10-CM | POA: Diagnosis not present

## 2022-06-21 DIAGNOSIS — M6281 Muscle weakness (generalized): Secondary | ICD-10-CM | POA: Diagnosis not present

## 2022-06-21 DIAGNOSIS — F319 Bipolar disorder, unspecified: Secondary | ICD-10-CM | POA: Diagnosis not present

## 2022-06-21 DIAGNOSIS — L309 Dermatitis, unspecified: Secondary | ICD-10-CM | POA: Diagnosis not present

## 2022-06-21 DIAGNOSIS — K219 Gastro-esophageal reflux disease without esophagitis: Secondary | ICD-10-CM | POA: Diagnosis not present

## 2022-06-21 DIAGNOSIS — H409 Unspecified glaucoma: Secondary | ICD-10-CM | POA: Diagnosis not present

## 2022-06-21 DIAGNOSIS — F411 Generalized anxiety disorder: Secondary | ICD-10-CM | POA: Diagnosis not present

## 2022-06-21 DIAGNOSIS — R29898 Other symptoms and signs involving the musculoskeletal system: Secondary | ICD-10-CM | POA: Diagnosis not present

## 2022-06-21 DIAGNOSIS — I129 Hypertensive chronic kidney disease with stage 1 through stage 4 chronic kidney disease, or unspecified chronic kidney disease: Secondary | ICD-10-CM | POA: Diagnosis not present

## 2022-06-21 DIAGNOSIS — R1314 Dysphagia, pharyngoesophageal phase: Secondary | ICD-10-CM | POA: Diagnosis not present

## 2022-06-21 DIAGNOSIS — M1990 Unspecified osteoarthritis, unspecified site: Secondary | ICD-10-CM | POA: Diagnosis not present

## 2022-06-21 DIAGNOSIS — R41841 Cognitive communication deficit: Secondary | ICD-10-CM | POA: Diagnosis not present

## 2022-06-21 DIAGNOSIS — R011 Cardiac murmur, unspecified: Secondary | ICD-10-CM | POA: Diagnosis not present

## 2022-06-21 DIAGNOSIS — G249 Dystonia, unspecified: Secondary | ICD-10-CM | POA: Diagnosis not present

## 2022-06-26 ENCOUNTER — Other Ambulatory Visit: Payer: Self-pay | Admitting: Nurse Practitioner

## 2022-06-26 DIAGNOSIS — R29898 Other symptoms and signs involving the musculoskeletal system: Secondary | ICD-10-CM | POA: Diagnosis not present

## 2022-06-26 DIAGNOSIS — R1314 Dysphagia, pharyngoesophageal phase: Secondary | ICD-10-CM | POA: Diagnosis not present

## 2022-06-26 DIAGNOSIS — M6281 Muscle weakness (generalized): Secondary | ICD-10-CM | POA: Diagnosis not present

## 2022-06-26 DIAGNOSIS — R41841 Cognitive communication deficit: Secondary | ICD-10-CM | POA: Diagnosis not present

## 2022-06-28 DIAGNOSIS — M6281 Muscle weakness (generalized): Secondary | ICD-10-CM | POA: Diagnosis not present

## 2022-06-28 DIAGNOSIS — R1314 Dysphagia, pharyngoesophageal phase: Secondary | ICD-10-CM | POA: Diagnosis not present

## 2022-06-28 DIAGNOSIS — R29898 Other symptoms and signs involving the musculoskeletal system: Secondary | ICD-10-CM | POA: Diagnosis not present

## 2022-06-28 DIAGNOSIS — R41841 Cognitive communication deficit: Secondary | ICD-10-CM | POA: Diagnosis not present

## 2022-07-01 DIAGNOSIS — M6281 Muscle weakness (generalized): Secondary | ICD-10-CM | POA: Diagnosis not present

## 2022-07-01 DIAGNOSIS — R1314 Dysphagia, pharyngoesophageal phase: Secondary | ICD-10-CM | POA: Diagnosis not present

## 2022-07-01 DIAGNOSIS — R41841 Cognitive communication deficit: Secondary | ICD-10-CM | POA: Diagnosis not present

## 2022-07-01 DIAGNOSIS — R29898 Other symptoms and signs involving the musculoskeletal system: Secondary | ICD-10-CM | POA: Diagnosis not present

## 2022-07-02 DIAGNOSIS — M47812 Spondylosis without myelopathy or radiculopathy, cervical region: Secondary | ICD-10-CM | POA: Diagnosis not present

## 2022-07-10 DIAGNOSIS — R1314 Dysphagia, pharyngoesophageal phase: Secondary | ICD-10-CM | POA: Diagnosis not present

## 2022-07-10 DIAGNOSIS — M6281 Muscle weakness (generalized): Secondary | ICD-10-CM | POA: Diagnosis not present

## 2022-07-10 DIAGNOSIS — R29898 Other symptoms and signs involving the musculoskeletal system: Secondary | ICD-10-CM | POA: Diagnosis not present

## 2022-07-10 DIAGNOSIS — R41841 Cognitive communication deficit: Secondary | ICD-10-CM | POA: Diagnosis not present

## 2022-07-11 ENCOUNTER — Non-Acute Institutional Stay: Payer: Medicare PPO | Admitting: Nurse Practitioner

## 2022-07-11 ENCOUNTER — Encounter: Payer: Self-pay | Admitting: Nurse Practitioner

## 2022-07-11 VITALS — BP 110/74 | HR 52 | Temp 97.4°F | Resp 16 | Ht 61.0 in | Wt 117.0 lb

## 2022-07-11 DIAGNOSIS — E785 Hyperlipidemia, unspecified: Secondary | ICD-10-CM | POA: Diagnosis not present

## 2022-07-11 DIAGNOSIS — K219 Gastro-esophageal reflux disease without esophagitis: Secondary | ICD-10-CM

## 2022-07-11 DIAGNOSIS — R1312 Dysphagia, oropharyngeal phase: Secondary | ICD-10-CM

## 2022-07-11 DIAGNOSIS — M159 Polyosteoarthritis, unspecified: Secondary | ICD-10-CM | POA: Diagnosis not present

## 2022-07-11 DIAGNOSIS — I10 Essential (primary) hypertension: Secondary | ICD-10-CM

## 2022-07-11 DIAGNOSIS — R001 Bradycardia, unspecified: Secondary | ICD-10-CM | POA: Diagnosis not present

## 2022-07-11 DIAGNOSIS — R4189 Other symptoms and signs involving cognitive functions and awareness: Secondary | ICD-10-CM

## 2022-07-11 DIAGNOSIS — J309 Allergic rhinitis, unspecified: Secondary | ICD-10-CM

## 2022-07-11 DIAGNOSIS — F304 Manic episode in full remission: Secondary | ICD-10-CM

## 2022-07-11 DIAGNOSIS — B370 Candidal stomatitis: Secondary | ICD-10-CM

## 2022-07-11 DIAGNOSIS — R682 Dry mouth, unspecified: Secondary | ICD-10-CM

## 2022-07-11 DIAGNOSIS — K5901 Slow transit constipation: Secondary | ICD-10-CM | POA: Diagnosis not present

## 2022-07-11 DIAGNOSIS — R609 Edema, unspecified: Secondary | ICD-10-CM

## 2022-07-11 MED ORDER — OMEPRAZOLE 20 MG PO CPDR: 20.0000 mg | DELAYED_RELEASE_CAPSULE | Freq: Every day | ORAL | 3 refills | Status: DC

## 2022-07-11 NOTE — Assessment & Plan Note (Signed)
Resolved sore throat, esophageal discomfort, under went GI, ENR, resolved on Stella [employer] VEGA oral care peppermint rinse recommended by dentist.

## 2022-07-11 NOTE — Assessment & Plan Note (Signed)
Persisted running nose, facial pressure, sore throat, underwent ENT evaluation.

## 2022-07-11 NOTE — Assessment & Plan Note (Signed)
LDL 166 04/09/21, on diet, allergic to statin.  

## 2022-07-11 NOTE — Assessment & Plan Note (Signed)
f/u endocrinology, Ca 10.7 04/09/21,  PTH 79 01/12/20. GDR of Lithium may help 

## 2022-07-11 NOTE — Assessment & Plan Note (Signed)
LLE negative DVT venous US, L>R. Medial thickened swelling area about her palm sized, mild warmth, redness, and indurated area for a few month. Varicose veins LLE not new.  

## 2022-07-11 NOTE — Assessment & Plan Note (Signed)
improved from 40s to 50s. Hx of normalized HR after Tylenol PM dc'd, GDR of Lithium helped too. 

## 2022-07-11 NOTE — Progress Notes (Signed)
Location:   Clinic [facility] Room Number: [id number]/1106/P Place of Service:  Clinic (12) Provider: Chipper Oman NP  Code Status: DNR Goals of Care:     07/11/2022    9:45 AM  Advanced Directives  Does Patient Have a Medical Advance Directive? Yes  Type of Advance Directive Healthcare Power of Attorney  Does patient want to make changes to medical advance directive? No - Patient declined  Copy of Healthcare Power of Attorney in Chart? Yes - validated most recent copy scanned in chart (See row information)     Chief Complaint  Patient presents with   Medical Management of Chronic Issues    Patient is here for a follow up for chronic conditions    HPI: Patient is a 82 y.o. female seen today for medical management of chronic diseases.    Dry mouth syndrome, resolved after dentist recommend mouth wash  Persisted running nose, facial pressure, sore throat, underwent ENT evaluation              Esophageal candidiasis, recurrent thrush identified on swallow study, FEES showed diffused white secretion coating areas of white plaques throughout pharynx. The patient c/o sore in buccal sides of tongue and in her throat when swallows. Treated with Diflucan in the past, underwent GI evaluation, EGD 03/25/22 normal findings. Last treated with Diflucan 300mg  x1 04/10/22, then followed 200mg  qd x1 wk, may repeat x1.                Edema, LLE negative DVT venous US, L>R. Medial thickened swelling area about her palm sized, mild warmth, redness, and indurated area for a few month. Varicose veins LLE not new.  Tinnitus, underwent eval of  ENT, suggested music background.              Hx of GIST of small intestine s/p resection. MiraLax, Psyllium              Dysphagia, GI evaluated, EGD 03/25/22 wnl, working with ST, thicken liquids.              Constipation, Metamucil, MiraLax,  effective.              GERD, stable, on Omeprazole, Hgb 12.8 04/09/21             HTN, blood pressure is controlled on  Losartan             Bipolar disorder, stable, on Lithium, GDR due to bradycardia, takes Alprazolam too, TSH 2.08 04/09/21. Dr Hyacinth Meeker started low dose of Depakote. Lithium level 0.7 12/20/21             Bradycardia, improved from 40s to 50s. Hx of normalized HR after Tylenol PM dc'd, GDR of Lithium helped too.              Hypercalcemia, f/u endocrinology, Ca 10.7 04/09/21,  PTH 79 01/12/20. GDR of Lithium may help             OA, takes  Tylenol, s/p R knee arthroplasty.             Cervical degenerative changes/dystonia, better, CT/MRI 12/2019 ruled out acute process, aches in neck R>L travels to the back of head, comes and goes, not disabling, f/u Neurosurgery. Failed Cymbalta, Gabapentin, Robaxin, Ultracet. s/p Botulinum Toxin inj by Neurology. ESR 9, CRP 2.7 10/09/20. The patient stated Alprazolam and Tylenol are kind of effective. Improved on Topamax, but associated with weight loss.  No Parkinson's disease, took her off  Sinemet per Neurology.              Cognitive impairment: 12/2019 MRI brain showed chronic microvascular ischemic changes, no acute disease with mild cortical atrophy, TSH 2.08 04/09/21             Hyperlipidemia, LDL 166 04/09/21, on diet, allergic to statin.    Past Medical History:  Diagnosis Date   Anemia    Anxiety    Arthritis    Cardiac conduction disorder 03/30/2012   Overview:  STORY: ETT 03/09/2012 Echo 03/07/2012 normal Dr Jacinto Halim, bradycardia felt due to glaucoma eye drops   Cervical dystonia    Diverticulosis of colon    DOE (dyspnea on exertion) 03/25/2018   Gastroesophageal cancer (HCC)    GERD (gastroesophageal reflux disease)    GIST (gastrointestinal stroma tumor), malignant, colon (HCC)    Glaucoma    Heart murmur    History of colon polyps 10/24/2008   Hypertension    Major neurocognitive disorder due to Parkinson's disease, possible    Tremors possible parkinsons   Manic disorder, single episode, in full remission (HCC) 12/01/2009   Sixth nerve palsy      Past Surgical History:  Procedure Laterality Date   BILATERAL SALPINGOOPHORECTOMY  09/22/2007   CATARACT EXTRACTION Bilateral    ESOPHAGOGASTRODUODENOSCOPY (EGD) WITH PROPOFOL N/A 03/25/2022   Procedure: ESOPHAGOGASTRODUODENOSCOPY (EGD) WITH PROPOFOL;  Surgeon: Sherrilyn Rist, MD;  Location: [facility];  Service: Gastroenterology;  Laterality: N/A;   Gastrointestinal Stroma Tumor,  Other  1960   GIST Surgery   ILEOCECETOMY  09/22/2007   OVARIAN CYST REMOVAL Right 1967   SMALL INTESTINE SURGERY  09/22/2007   TOTAL KNEE ARTHROPLASTY Right 10/05/2019   Procedure: RIGHT TOTAL KNEE ARTHROPLASTY;  Surgeon: Cammy Copa, MD;  Location: [facility] OR;  Service: Orthopedics;  Laterality: Right;   TOTAL VAGINAL HYSTERECTOMY  1986   Fibroids    Allergies  Allergen Reactions   Lisinopril Cough   Penicillins Itching    50 years ago   Adhesive [Tape] Itching   Atorvastatin Itching   Dilaudid [Hydromorphone Hcl] Itching   Hydromorphone Itching    Allergies as of 07/11/2022       Reactions   Lisinopril Cough   Penicillins Itching   50 years ago   Adhesive [tape] Itching   Atorvastatin Itching   Dilaudid [hydromorphone Hcl] Itching   Hydromorphone Itching        Medication List        Accurate as of July 11, 2022  4:30 PM. If you have any questions, ask your nurse or doctor.          ALPRAZolam 0.5 MG tablet Commonly known as: XANAX TAKE ONE TABLET BY MOUTH TWICE DAILY AS NEEDED FOR ANXIETY   aspirin 81 MG chewable tablet Chew 81 mg by mouth daily.   Biotene Dry Mouth Lozg Use as directed 1 tablet in the mouth or throat 4 (four) times daily as needed.   bismuth subsalicylate 262 MG/15ML suspension Commonly known as: PEPTO BISMOL Take 30 mLs by mouth every 6 (six) hours as needed for diarrhea or loose stools.   clindamycin 150 MG capsule Commonly known as: CLEOCIN Take 600mg  1 hour prior to dental procedure   fluconazole 200 MG tablet Commonly known as:  DIFLUCAN Take 1 tablet (200 mg total) by mouth daily. What changed: Another medication with the same name was removed. Continue taking this medication, and follow the directions you see here. Changed by: Hildreth Robart X Alonie Gazzola,  NP   latanoprost 0.005 % ophthalmic solution Commonly known as: XALATAN Place 1 drop into both eyes at bedtime.   lithium carbonate 150 MG capsule Take 150 mg by mouth 3 (three) times daily with meals.   losartan 50 MG tablet Commonly known as: COZAAR TAKE ONE TABLET BY MOUTH ONCE DAILY   MAGNESIUM PO Take 400 mg by mouth daily.   multivitamin tablet Take 1 tablet by mouth daily.   omeprazole 20 MG capsule Commonly known as: PRILOSEC Take 1 capsule (20 mg total) by mouth daily. Started by: Wen Munford X Patrisha Hausmann, NP   polyethylene glycol 17 g packet Commonly known as: MIRALAX / GLYCOLAX Take 17 g by mouth daily.   Restasis 0.05 % ophthalmic emulsion Generic drug: cycloSPORINE Place 1 drop into both eyes 2 (two) times daily.   topiramate 25 MG tablet Commonly known as: TOPAMAX TAKE ONE TABLET BY MOUTH TWICE DAILY IN THE MORNING AND IN THE EVENING   triamcinolone ointment 0.5 % Commonly known as: KENALOG Apply 1 Application topically 2 (two) times daily as needed (place on leg).        Review of Systems:  Review of Systems  Constitutional:  Negative for appetite change, fatigue and fever.  HENT:  Positive for hearing loss and rhinorrhea. Negative for congestion, facial swelling, mouth sores, postnasal drip, sinus pressure, sore throat, tinnitus, trouble swallowing and voice change.        C/o sore buccal aspect tongue and in throat when swallows resolved.   Eyes:  Negative for visual disturbance.       Glaucoma, diplopia R+L lateral peripheral visual fields only.   Respiratory:  Negative for shortness of breath.        Occasionally DOE, hacking cough  Cardiovascular:  Positive for leg swelling.  Gastrointestinal:  Negative for abdominal pain and constipation.   Genitourinary:  Positive for frequency. Negative for dysuria and urgency.       Couple of times at night, no difficulty of returning asleep.   Musculoskeletal:  Positive for arthralgias and gait problem.       Walker.   Skin:  Negative for color change.  Neurological:  Negative for tremors, speech difficulty, light-headedness and headaches.       Comes and goes nature of the neck/occipital pain. Stiff neck. Pain the patent right knee, s/p TKR. Tremor is better since Sinemet.   Psychiatric/Behavioral:  Positive for sleep disturbance. Negative for confusion. The patient is nervous/anxious.        Better mood, feels not as anxious or depressive mood as prior.     Health Maintenance  Topic Date Due   DTaP/Tdap/Td (2 - Td or Tdap) 08/19/2021   COVID-19 Vaccine (5 - 2023-24 season) 01/10/2022   INFLUENZA VACCINE  08/15/2022   Medicare Annual Wellness (AWV)  02/08/2023   Pneumonia Vaccine 2+ Years old  Completed   DEXA SCAN  Completed   Zoster Vaccines- Shingrix  Completed   HPV VACCINES  Aged Out    Physical Exam: Vitals:   07/11/22 1338  BP: 110/74  Pulse: (!) 52  Resp: 16  Temp: (!) 97.4 F (36.3 C)  SpO2: 99%  Weight: 117 lb (53.1 kg)  Height: 5\' 1"  (1.549 m)   Body mass index is 22.11 kg/m. Physical Exam Vitals and nursing note reviewed.  Constitutional:      Appearance: Normal appearance.  HENT:     Head: Normocephalic and atraumatic.     Nose: Nose normal.     Mouth/Throat:  Mouth: Mucous membranes are moist.     Pharynx: No oropharyngeal exudate or posterior oropharyngeal erythema.  Eyes:     Extraocular Movements: Extraocular movements intact.     Conjunctiva/sclera: Conjunctivae normal.     Pupils: Pupils are equal, round, and reactive to light.  Cardiovascular:     Rate and Rhythm: Regular rhythm. Bradycardia present.     Heart sounds: Murmur heard.     Comments: DP pulses present R+L. HR in 50s Pulmonary:     Effort: Pulmonary effort is normal.      Breath sounds: No rales.  Abdominal:     General: Bowel sounds are normal.     Palpations: Abdomen is soft.     Tenderness: There is no abdominal tenderness.  Musculoskeletal:     Cervical back: Normal range of motion and neck supple.     Right lower leg: Edema present.     Left lower leg: Edema present.     Comments: Chronic R knee pain is improved. S/p TKR right. Stiff neck, comes and goes neck/occipital pain. Trace edema BLE. Arthritic joint change fingers.   Skin:    General: Skin is warm and dry.     Comments: Improved the medial lower left leg a palm sized thickened, slightly reddened area, mild discomfort when palpated. DP present left foot. Scattered varicose veins left leg 05/09/22 medical left MTJ callous, plantar aspect 3rd toe callous, the left 2nd toe pain after injury, but no bruise, deformity, swelling, redness, able to PROM w/o pain.    Neurological:     General: No focal deficit present.     Mental Status: She is alert and oriented to person, place, and time. Mental status is at baseline.     Motor: No weakness.     Coordination: Coordination abnormal.     Gait: Gait abnormal.     Comments: Resting tremor in fingers, near resolution.   Psychiatric:        Mood and Affect: Mood normal.        Behavior: Behavior normal.        Thought Content: Thought content normal.     Labs reviewed: Basic Metabolic Panel: No results for input(s): "NA", "K", "CL", "CO2", "GLUCOSE", "BUN", "CREATININE", "CALCIUM", "MG", "PHOS", "TSH" in the last 8760 hours. Liver Function Tests: No results for input(s): "AST", "ALT", "ALKPHOS", "BILITOT", "PROT", "ALBUMIN" in the last 8760 hours. No results for input(s): "LIPASE", "AMYLASE" in the last 8760 hours. No results for input(s): "AMMONIA" in the last 8760 hours. CBC: No results for input(s): "WBC", "NEUTROABS", "HGB", "HCT", "MCV", "PLT" in the last 8760 hours. Lipid Panel: No results for input(s): "CHOL", "HDL", "LDLCALC", "TRIG",  "CHOLHDL", "LDLDIRECT" in the last 8760 hours. No results found for: "HGBA1C"  Procedures since last visit: No results found.  Assessment/Plan  Bipolar I disorder, single manic episode, in full remission (HCC)  stable, on Lithium, GDR due to bradycardia, takes Alprazolam too, TSH 2.08 04/09/21. Dr Hyacinth Meeker started low dose of Depakote. Lithium level 0.7 12/20/21 Update CBC/diff, CMP/eGFR, TSH, Lithium, Vit B12, Vit D, lipid panel,   Bradycardia  improved from 40s to 50s. Hx of normalized HR after Tylenol PM dc'd, GDR of Lithium helped too.   Hypercalcemia  f/u endocrinology, Ca 10.7 04/09/21,  PTH 79 01/12/20. GDR of Lithium may help  Arthritis, degenerative  takes  Tylenol, s/p R knee arthroplasty.             Cervical degenerative changes/dystonia, better, CT/MRI 12/2019 ruled out acute  process, aches in neck R>L travels to the back of head, comes and goes, not disabling, f/u Neurosurgery. Failed Cymbalta, Gabapentin, Robaxin, Ultracet. s/p Botulinum Toxin inj by Neurology. ESR 9, CRP 2.7 10/09/20. The patient stated Alprazolam and Tylenol are kind of effective. Improved on Topamax, but associated with weight loss.  No Parkinson's disease, took her off Sinemet per Neurology.   Cognitive impairment  12/2019 MRI brain showed chronic microvascular ischemic changes, no acute disease with mild cortical atrophy, TSH 2.08 04/09/21  HLD (hyperlipidemia)  LDL 166 04/09/21, on diet, allergic to statin.    Essential (primary) hypertension  blood pressure is controlled on Losartan  GERD (gastroesophageal reflux disease) stable, on Omeprazole, Hgb 12.8 04/09/21  Slow transit constipation Metamucil, MiraLax,  effective.   Dysphagia  GI evaluated, EGD 03/25/22 wnl, working with ST, thicken liquids.   Edema LLE negative DVT venous US, L>R. Medial thickened swelling area about her palm sized, mild warmth, redness, and indurated area for a few month. Varicose veins LLE not new.   Oropharyngeal  candidiasis Esophageal candidiasis, recurrent thrush identified on swallow study, FEES showed diffused white secretion coating areas of white plaques throughout pharynx. The patient c/o sore in buccal sides of tongue and in her throat when swallows. Treated with Diflucan in the past, underwent GI evaluation, EGD 03/25/22 normal findings. Last treated with Diflucan 300mg  x1 04/10/22, then followed 200mg  qd x1 wk, may repeat x1.     Allergic sinusitis Persisted running nose, facial pressure, sore throat, underwent ENT evaluation.  Dry mouth Resolved sore throat, esophageal discomfort, under went GI, ENR, resolved on [employer] oral care peppermint rinse recommended by dentist.    Labs/tests ordered:  CBC/diff, CMP/eGFR, Vit 12, Vit D, lipid panel, lithium.   Next appt:  4 months

## 2022-07-11 NOTE — Assessment & Plan Note (Signed)
blood pressure is controlled on Losartan 

## 2022-07-11 NOTE — Assessment & Plan Note (Signed)
GI evaluated, EGD 03/25/22 wnl, working with ST, thicken liquids.  

## 2022-07-11 NOTE — Assessment & Plan Note (Signed)
Esophageal candidiasis, recurrent thrush identified on swallow study, FEES showed diffused white secretion coating areas of white plaques throughout pharynx. The patient c/o sore in buccal sides of tongue and in her throat when swallows. Treated with Diflucan in the past, underwent GI evaluation, EGD 03/25/22 normal findings. Last treated with Diflucan 300mg  x1 04/10/22, then followed 200mg  qd x1 wk, may repeat x1.

## 2022-07-11 NOTE — Assessment & Plan Note (Signed)
takes  Tylenol, s/p R knee arthroplasty.             Cervical degenerative changes/dystonia, better, CT/MRI 12/2019 ruled out acute process, aches in neck R>L travels to the back of head, comes and goes, not disabling, f/u Neurosurgery. Failed Cymbalta, Gabapentin, Robaxin, Ultracet. s/p Botulinum Toxin inj by Neurology. ESR 9, CRP 2.7 10/09/20. The patient stated Alprazolam and Tylenol are kind of effective. Improved on Topamax, but associated with weight loss.  No Parkinson's disease, took her off Sinemet per Neurology.

## 2022-07-11 NOTE — Assessment & Plan Note (Signed)
12/2019 MRI brain showed chronic microvascular ischemic changes, no acute disease with mild cortical atrophy, TSH 2.08 04/09/21 

## 2022-07-11 NOTE — Assessment & Plan Note (Signed)
stable, on Lithium, GDR due to bradycardia, takes Alprazolam too, TSH 2.08 04/09/21. Dr Hyacinth Meeker started low dose of Depakote. Lithium level 0.7 12/20/21 Update CBC/diff, CMP/eGFR, TSH, Lithium, Vit B12, Vit D, lipid panel,

## 2022-07-11 NOTE — Assessment & Plan Note (Signed)
stable, on Omeprazole, Hgb 12.8 04/09/21 

## 2022-07-11 NOTE — Assessment & Plan Note (Signed)
Metamucil, MiraLax,  effective.  

## 2022-07-15 ENCOUNTER — Encounter: Payer: Medicare PPO | Admitting: Orthopedic Surgery

## 2022-07-16 NOTE — Progress Notes (Unsigned)
This encounter was created in error - please disregard.

## 2022-07-22 DIAGNOSIS — R29898 Other symptoms and signs involving the musculoskeletal system: Secondary | ICD-10-CM | POA: Diagnosis not present

## 2022-07-22 DIAGNOSIS — M6281 Muscle weakness (generalized): Secondary | ICD-10-CM | POA: Diagnosis not present

## 2022-07-23 DIAGNOSIS — H43813 Vitreous degeneration, bilateral: Secondary | ICD-10-CM | POA: Diagnosis not present

## 2022-07-23 DIAGNOSIS — H5211 Myopia, right eye: Secondary | ICD-10-CM | POA: Diagnosis not present

## 2022-07-23 DIAGNOSIS — H26492 Other secondary cataract, left eye: Secondary | ICD-10-CM | POA: Diagnosis not present

## 2022-07-23 DIAGNOSIS — H349 Unspecified retinal vascular occlusion: Secondary | ICD-10-CM | POA: Diagnosis not present

## 2022-07-23 DIAGNOSIS — H401134 Primary open-angle glaucoma, bilateral, indeterminate stage: Secondary | ICD-10-CM | POA: Diagnosis not present

## 2022-07-23 DIAGNOSIS — H04123 Dry eye syndrome of bilateral lacrimal glands: Secondary | ICD-10-CM | POA: Diagnosis not present

## 2022-07-23 DIAGNOSIS — H5000 Unspecified esotropia: Secondary | ICD-10-CM | POA: Diagnosis not present

## 2022-07-25 DIAGNOSIS — R29898 Other symptoms and signs involving the musculoskeletal system: Secondary | ICD-10-CM | POA: Diagnosis not present

## 2022-07-25 DIAGNOSIS — M6281 Muscle weakness (generalized): Secondary | ICD-10-CM | POA: Diagnosis not present

## 2022-07-30 ENCOUNTER — Other Ambulatory Visit: Payer: Medicare PPO

## 2022-07-30 DIAGNOSIS — M6281 Muscle weakness (generalized): Secondary | ICD-10-CM | POA: Diagnosis not present

## 2022-07-30 DIAGNOSIS — R29898 Other symptoms and signs involving the musculoskeletal system: Secondary | ICD-10-CM | POA: Diagnosis not present

## 2022-07-30 DIAGNOSIS — F304 Manic episode in full remission: Secondary | ICD-10-CM | POA: Diagnosis not present

## 2022-07-30 LAB — CBC AND DIFFERENTIAL
HCT: 36 (ref 36–46)
Hemoglobin: 12.2 (ref 12.0–16.0)
Neutrophils Absolute: 6028
Platelets: 292 10*3/uL (ref 150–400)
WBC: 7.9

## 2022-07-30 LAB — CBC: RBC: 3.66 — AB (ref 3.87–5.11)

## 2022-08-01 ENCOUNTER — Non-Acute Institutional Stay: Payer: Medicare PPO | Admitting: Nurse Practitioner

## 2022-08-01 ENCOUNTER — Encounter: Payer: Self-pay | Admitting: Nurse Practitioner

## 2022-08-01 DIAGNOSIS — E785 Hyperlipidemia, unspecified: Secondary | ICD-10-CM | POA: Diagnosis not present

## 2022-08-01 DIAGNOSIS — R001 Bradycardia, unspecified: Secondary | ICD-10-CM | POA: Diagnosis not present

## 2022-08-01 DIAGNOSIS — I10 Essential (primary) hypertension: Secondary | ICD-10-CM

## 2022-08-01 DIAGNOSIS — R269 Unspecified abnormalities of gait and mobility: Secondary | ICD-10-CM | POA: Insufficient documentation

## 2022-08-01 DIAGNOSIS — R1312 Dysphagia, oropharyngeal phase: Secondary | ICD-10-CM

## 2022-08-01 DIAGNOSIS — C49A3 Gastrointestinal stromal tumor of small intestine: Secondary | ICD-10-CM

## 2022-08-01 DIAGNOSIS — M159 Polyosteoarthritis, unspecified: Secondary | ICD-10-CM | POA: Diagnosis not present

## 2022-08-01 DIAGNOSIS — F304 Manic episode in full remission: Secondary | ICD-10-CM | POA: Diagnosis not present

## 2022-08-01 DIAGNOSIS — K219 Gastro-esophageal reflux disease without esophagitis: Secondary | ICD-10-CM

## 2022-08-01 DIAGNOSIS — G243 Spasmodic torticollis: Secondary | ICD-10-CM

## 2022-08-01 DIAGNOSIS — R4189 Other symptoms and signs involving cognitive functions and awareness: Secondary | ICD-10-CM | POA: Diagnosis not present

## 2022-08-01 MED ORDER — PRAVASTATIN SODIUM 10 MG PO TABS
5.0000 mg | ORAL_TABLET | Freq: Every day | ORAL | 0 refills | Status: AC
Start: 2022-08-01 — End: ?

## 2022-08-01 NOTE — Assessment & Plan Note (Signed)
,   stable, on Lithium, GDR due to bradycardia, takes Alprazolam too, TSH 2.08 07/30/22. Dr Hyacinth Meeker started low dose of Depakote. Lithium level 0.4 07/30/22

## 2022-08-01 NOTE — Assessment & Plan Note (Signed)
12/2019 MRI brain showed chronic microvascular ischemic changes, no acute disease with mild cortical atrophy, TSH 2.08, Vit B12 466 07/30/22

## 2022-08-01 NOTE — Assessment & Plan Note (Signed)
takes Metamucil, MiraLax,  effective, for constipation. GERD, stable, on Omeprazole, Hgb 12.2 07/30/22

## 2022-08-01 NOTE — Assessment & Plan Note (Signed)
blood pressure is controlled on Losartan 

## 2022-08-01 NOTE — Assessment & Plan Note (Signed)
f/u endocrinology, Ca 10.7 04/09/21,  PTH 79 01/12/20. GDR of Lithium may help 

## 2022-08-01 NOTE — Assessment & Plan Note (Addendum)
LDL 137 07/30/22, on diet, allergic to statin-the patient said it was just itching, its ok to try Pravastatin 5mg  every day.

## 2022-08-01 NOTE — Assessment & Plan Note (Signed)
GI evaluated, EGD 03/25/22 wnl, working with ST, thicken liquids.

## 2022-08-01 NOTE — Assessment & Plan Note (Signed)
No change, LLE negative DVT venous US, L>R. Medial thickened swelling area about her palm sized, mild warmth, redness, and indurated area for a few month. Varicose veins LLE not new.

## 2022-08-01 NOTE — Assessment & Plan Note (Signed)
takes  Tylenol, s/p R knee arthroplasty.  

## 2022-08-01 NOTE — Assessment & Plan Note (Signed)
Cervical degenerative changes/dystonia, better, CT/MRI 12/2019 ruled out acute process, aches in neck R>L travels to the back of head, comes and goes, not disabling, f/u Neurosurgery. Failed Cymbalta, Gabapentin, Robaxin, Ultracet. s/p Botulinum Toxin inj by Neurology. ESR 9, CRP 2.7 10/09/20(ESR 6 07/30/22). The patient stated Alprazolam and Tylenol are kind of effective. Improved on Topamax, but associated with weight loss.  No Parkinson's disease, took her off Sinemet per Neurology.

## 2022-08-01 NOTE — Assessment & Plan Note (Signed)
Hx of GIST of small intestine s/p resection. MiraLax, Psyllium

## 2022-08-01 NOTE — Assessment & Plan Note (Addendum)
Bradycardia, 48bpm, Hx better HR after Tylenol PM dc'd, GDR of Lithium helped too.

## 2022-08-01 NOTE — Progress Notes (Signed)
Location:   Clinic [facility]   Place of Service:  Clinic (12) Provider: Chipper Oman NP  Code Status: DNR Goals of Care:     08/01/2022    2:44 PM  Advanced Directives  Does Patient Have a Medical Advance Directive? Yes  Type of Advance Directive Healthcare Power of Attorney  Does patient want to make changes to medical advance directive? No - Patient declined  Copy of Healthcare Power of Attorney in Chart? Yes - validated most recent copy scanned in chart (See row information)     Chief Complaint  Patient presents with  . Follow-up    Three Month follow-up    HPI: Patient is a 82 y.o. female seen today for medical management of chronic diseases.     Dry mouth syndrome, resolved after dentist recommend mouth wash             Persisted running nose, facial pressure, sore throat, underwent ENT evaluation              Esophageal candidiasis, recurrent thrush identified on swallow study, FEES showed diffused white secretion coating areas of white plaques throughout pharynx. The patient c/o sore in buccal sides of tongue and in her throat when swallows. Treated with Diflucan in the past, underwent GI evaluation, EGD 03/25/22 normal findings. Last treated with Diflucan 300mg  x1 04/10/22, then followed 200mg  qd x1 wk, may repeat x1.                Edema, LLE negative DVT venous US, L>R. Medial thickened swelling area about her palm sized, mild warmth, redness, and indurated area for a few month. Varicose veins LLE not new.  Tinnitus, underwent eval of  ENT, suggested music background.              Hx of GIST of small intestine s/p resection. MiraLax, Psyllium              Dysphagia, GI evaluated, EGD 03/25/22 wnl, working with ST, thicken liquids.              Constipation, Metamucil, MiraLax,  effective.              GERD, stable, on Omeprazole, Hgb 12.2 07/30/22             HTN, blood pressure is controlled on Losartan             Bipolar disorder, stable, on Lithium, GDR due to bradycardia,  takes Alprazolam too, TSH 2.08 07/30/22. Dr Hyacinth Meeker started low dose of Depakote. Lithium level 0.4 07/30/22             Bradycardia, improved from 40s to 50s. Hx of normalized HR after Tylenol PM dc'd, GDR of Lithium helped too.              Hypercalcemia, f/u endocrinology, Ca 10.7 04/09/21,  PTH 79 01/12/20. GDR of Lithium may help             OA, takes  Tylenol, s/p R knee arthroplasty.             Cervical degenerative changes/dystonia, better, CT/MRI 12/2019 ruled out acute process, aches in neck R>L travels to the back of head, comes and goes, not disabling, f/u Neurosurgery. Failed Cymbalta, Gabapentin, Robaxin, Ultracet. s/p Botulinum Toxin inj by Neurology. ESR 9, CRP 2.7 10/09/20(ESR 6 07/30/22). The patient stated Alprazolam and Tylenol are kind of effective. Improved on Topamax, but associated with weight loss.  No Parkinson's disease, took her off  Sinemet per Neurology.              Cognitive impairment: 12/2019 MRI brain showed chronic microvascular ischemic changes, no acute disease with mild cortical atrophy, TSH 2.08, Vit B12 466 07/30/22             Hyperlipidemia, LDL 137 07/30/22, on diet, allergic to statin.     Past Medical History:  Diagnosis Date  . Anemia   . Anxiety   . Arthritis   . Cardiac conduction disorder 03/30/2012   Overview:  STORY: ETT 03/09/2012 Echo 03/07/2012 normal Dr Jacinto Halim, bradycardia felt due to glaucoma eye drops  . Cervical dystonia   . Diverticulosis of colon   . DOE (dyspnea on exertion) 03/25/2018  . Gastroesophageal cancer (HCC)   . GERD (gastroesophageal reflux disease)   . GIST (gastrointestinal stroma tumor), malignant, colon (HCC)   . Glaucoma   . Heart murmur   . History of colon polyps 10/24/2008  . Hypertension   . Major neurocognitive disorder due to Parkinson's disease, possible    Tremors possible parkinsons  . Manic disorder, single episode, in full remission (HCC) 12/01/2009  . Sixth nerve palsy     Past Surgical History:  Procedure  Laterality Date  . BILATERAL SALPINGOOPHORECTOMY  09/22/2007  . CATARACT EXTRACTION Bilateral   . ESOPHAGOGASTRODUODENOSCOPY (EGD) WITH PROPOFOL N/A 03/25/2022   Procedure: ESOPHAGOGASTRODUODENOSCOPY (EGD) WITH PROPOFOL;  Surgeon: Sherrilyn Rist, MD;  Location: [facility];  Service: Gastroenterology;  Laterality: N/A;  . Gastrointestinal Stroma Tumor,  Other  1960   GIST Surgery  . ILEOCECETOMY  09/22/2007  . OVARIAN CYST REMOVAL Right 1967  . SMALL INTESTINE SURGERY  09/22/2007  . TOTAL KNEE ARTHROPLASTY Right 10/05/2019   Procedure: RIGHT TOTAL KNEE ARTHROPLASTY;  Surgeon: Cammy Copa, MD;  Location: [facility];  Service: Orthopedics;  Laterality: Right;  . TOTAL VAGINAL HYSTERECTOMY  1986   Fibroids    Allergies  Allergen Reactions  . Lisinopril Cough  . Penicillins Itching    50 years ago  . Adhesive [Tape] Itching  . Atorvastatin Itching  . Dilaudid [Hydromorphone Hcl] Itching  . Hydromorphone Itching    Allergies as of 08/01/2022       Reactions   Lisinopril Cough   Penicillins Itching   50 years ago   Adhesive [tape] Itching   Atorvastatin Itching   Dilaudid [hydromorphone Hcl] Itching   Hydromorphone Itching        Medication List        Accurate as of August 01, 2022 11:59 PM. If you have any questions, ask your nurse or doctor.          ALPRAZolam 0.5 MG tablet Commonly known as: XANAX TAKE ONE TABLET BY MOUTH TWICE DAILY AS NEEDED FOR ANXIETY   aspirin 81 MG chewable tablet Chew 81 mg by mouth daily.   Biotene Dry Mouth Lozg Use as directed 1 tablet in the mouth or throat 4 (four) times daily as needed.   bismuth subsalicylate 262 MG/15ML suspension Commonly known as: PEPTO BISMOL Take 30 mLs by mouth every 6 (six) hours as needed for diarrhea or loose stools.   clindamycin 150 MG capsule Commonly known as: CLEOCIN Take 600mg  1 hour prior to dental procedure   fluconazole 200 MG tablet Commonly known as: DIFLUCAN Take 1 tablet  (200 mg total) by mouth daily.   latanoprost 0.005 % ophthalmic solution Commonly known as: XALATAN Place 1 drop into both eyes at bedtime.   lithium carbonate  150 MG capsule Take 150 mg by mouth 3 (three) times daily with meals.   losartan 50 MG tablet Commonly known as: COZAAR TAKE ONE TABLET BY MOUTH ONCE DAILY   MAGNESIUM PO Take 400 mg by mouth daily.   multivitamin tablet Take 1 tablet by mouth daily.   omeprazole 20 MG capsule Commonly known as: PRILOSEC Take 1 capsule (20 mg total) by mouth daily.   polyethylene glycol 17 g packet Commonly known as: MIRALAX / GLYCOLAX Take 17 g by mouth daily.   pravastatin 10 MG tablet Commonly known as: PRAVACHOL Take 0.5 tablets (5 mg total) by mouth daily. Started by: Michoel Kunin [employer] [place]   Restasis 0.05 % ophthalmic emulsion Generic drug: cycloSPORINE Place 1 drop into both eyes 2 (two) times daily.   topiramate 25 MG tablet Commonly known as: TOPAMAX TAKE ONE TABLET BY MOUTH TWICE DAILY IN THE MORNING AND IN THE EVENING   triamcinolone ointment 0.5 % Commonly known as: KENALOG Apply 1 Application topically 2 (two) times daily as needed (place on leg).        Review of Systems:  Review of Systems  Constitutional:  Negative for appetite change, fatigue and fever.  HENT:  Positive for hearing loss and rhinorrhea. Negative for congestion, facial swelling, mouth sores, postnasal drip, sinus pressure, sore throat, tinnitus, trouble swallowing and voice change.        C/o sore buccal aspect tongue and in throat when swallows resolved.   Eyes:  Negative for visual disturbance.       Glaucoma, diplopia R+L lateral peripheral visual fields only.   Respiratory:  Negative for shortness of breath.        Occasionally DOE, hacking cough  Cardiovascular:  Positive for leg swelling.  Gastrointestinal:  Negative for abdominal pain and constipation.  Genitourinary:  Positive for frequency. Negative for dysuria and urgency.       Couple  of times at night, no difficulty of returning asleep.   Musculoskeletal:  Positive for arthralgias and gait problem.       Walker.   Skin:  Negative for color change.  Neurological:  Negative for tremors, speech difficulty, light-headedness and headaches.       Comes and goes nature of the neck/occipital pain. Stiff neck. Pain the patent right knee, s/p TKR. Tremor is better since Sinemet.   Psychiatric/Behavioral:  Positive for sleep disturbance. Negative for confusion. The patient is nervous/anxious.        Better mood, feels not as anxious or depressive mood as prior.     Health Maintenance  Topic Date Due  . DTaP/Tdap/Td (2 - Td or Tdap) 08/19/2021  . COVID-19 Vaccine (5 - 2023-24 season) 01/10/2022  . INFLUENZA VACCINE  08/15/2022  . Medicare Annual Wellness (AWV)  02/08/2023  . Pneumonia Vaccine 61+ Years old  Completed  . DEXA SCAN  Completed  . Zoster Vaccines- Shingrix  Completed  . HPV VACCINES  Aged Out    Physical Exam: Vitals:   08/01/22 1441  BP: 128/78  Pulse: (!) 48  Resp: 18  Temp: (!) 97 F (36.1 C)  SpO2: 99%  Weight: 121 lb 12.8 oz (55.2 kg)  Height: 5\' 1"  (1.549 m)   Body mass index is 23.01 kg/m. Physical Exam Vitals and nursing note reviewed.  Constitutional:      Appearance: Normal appearance.  HENT:     Head: Normocephalic and atraumatic.     Nose: Nose normal.     Mouth/Throat:     Mouth:  Mucous membranes are moist.     Pharynx: No oropharyngeal exudate or posterior oropharyngeal erythema.  Eyes:     Extraocular Movements: Extraocular movements intact.     Conjunctiva/sclera: Conjunctivae normal.     Pupils: Pupils are equal, round, and reactive to light.  Cardiovascular:     Rate and Rhythm: Regular rhythm. Bradycardia present.     Heart sounds: Murmur heard.     Comments: DP pulses present R+L. HR in 50s Pulmonary:     Effort: Pulmonary effort is normal.     Breath sounds: No rales.  Abdominal:     General: Bowel sounds are normal.      Palpations: Abdomen is soft.     Tenderness: There is no abdominal tenderness.  Musculoskeletal:     Cervical back: Normal range of motion and neck supple.     Right lower leg: Edema present.     Left lower leg: Edema present.     Comments: Chronic R knee pain is improved. S/p TKR right. Felt lateral right thigh muscle is tight.  Stiff neck, comes and goes neck/occipital pain. Trace edema BLE. Arthritic joint change fingers.   Skin:    General: Skin is warm and dry.     Comments: Improved the medial lower left leg a palm sized thickened, slightly reddened area, mild discomfort when palpated. DP present left foot. Scattered varicose veins left leg 05/09/22 medical left MTJ callous, plantar aspect 3rd toe callous, the left 2nd toe pain after injury, but no bruise, deformity, swelling, redness, able to PROM w/o pain.    Neurological:     General: No focal deficit present.     Mental Status: She is alert and oriented to person, place, and time. Mental status is at baseline.     Motor: No weakness.     Coordination: Coordination abnormal.     Gait: Gait abnormal.     Comments: Resting tremor in fingers, near resolution.   Psychiatric:        Mood and Affect: Mood normal.        Behavior: Behavior normal.        Thought Content: Thought content normal.    Labs reviewed: Basic Metabolic Panel: Recent Labs    07/30/22 0708  TSH 2.08   Liver Function Tests: No results for input(s): "AST", "ALT", "ALKPHOS", "BILITOT", "PROT", "ALBUMIN" in the last 8760 hours. No results for input(s): "LIPASE", "AMYLASE" in the last 8760 hours. No results for input(s): "AMMONIA" in the last 8760 hours. CBC: Recent Labs    07/30/22 0000 07/30/22 0708  WBC 7.9 7.9  NEUTROABS 6,028.00 6,028  HGB 12.2 12.2  HCT 36 36.2  MCV  --  98.9  PLT 292 292   Lipid Panel: Recent Labs    07/30/22 0708  CHOL 241*  HDL 74  LDLCALC 137*  TRIG 166*  CHOLHDL 3.3   No results found for:  "HGBA1C"  Procedures since last visit: No results found.  Assessment/Plan  Hypercalcemia f/u endocrinology, Ca 10.7 04/09/21,  PTH 79 01/12/20. GDR of Lithium may help  Arthritis, degenerative  takes  Tylenol, s/p R knee arthroplasty.   Cervical dystonia Cervical degenerative changes/dystonia, better, CT/MRI 12/2019 ruled out acute process, aches in neck R>L travels to the back of head, comes and goes, not disabling, f/u Neurosurgery. Failed Cymbalta, Gabapentin, Robaxin, Ultracet. s/p Botulinum Toxin inj by Neurology. ESR 9, CRP 2.7 10/09/20(ESR 6 07/30/22). The patient stated Alprazolam and Tylenol are kind of effective. Improved on Topamax, but  associated with weight loss.  No Parkinson's disease, took her off Sinemet per Neurology.   Cognitive impairment 12/2019 MRI brain showed chronic microvascular ischemic changes, no acute disease with mild cortical atrophy, TSH 2.08, Vit B12 466 07/30/22  HLD (hyperlipidemia)  LDL 137 07/30/22, on diet, allergic to statin-the patient said it was just itching, its ok to try Pravastatin 5mg  every day.   Gait abnormality Uses walker or railing   Bradycardia Bradycardia, 48bpm, Hx better HR after Tylenol PM dc'd, GDR of Lithium helped too.   Bipolar I disorder, single manic episode, in full remission (HCC) , stable, on Lithium, GDR due to bradycardia, takes Alprazolam too, TSH 2.08 07/30/22. Dr Hyacinth Meeker started low dose of Depakote. Lithium level 0.4 07/30/22  Essential (primary) hypertension blood pressure is controlled on Losartan  GERD (gastroesophageal reflux disease)  takes Metamucil, MiraLax,  effective, for constipation. GERD, stable, on Omeprazole, Hgb 12.2 07/30/22  Dysphagia GI evaluated, EGD 03/25/22 wnl, working with ST, thicken liquids.   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 MiraLax, Psyllium   Edema No change, LLE negative  DVT venous US, L>R. Medial thickened swelling area about her palm sized, mild warmth, redness, and indurated area for a few month. Varicose veins LLE not new.    Labs/tests ordered:  none  Next appt:  3 months

## 2022-08-01 NOTE — Assessment & Plan Note (Signed)
Uses walker or railing

## 2022-08-02 ENCOUNTER — Encounter: Payer: Self-pay | Admitting: Nurse Practitioner

## 2022-08-02 DIAGNOSIS — R29898 Other symptoms and signs involving the musculoskeletal system: Secondary | ICD-10-CM | POA: Diagnosis not present

## 2022-08-02 DIAGNOSIS — M6281 Muscle weakness (generalized): Secondary | ICD-10-CM | POA: Diagnosis not present

## 2022-08-03 LAB — CBC WITH DIFFERENTIAL/PLATELET
Absolute Monocytes: 379 cells/uL (ref 200–950)
Basophils Absolute: 47 cells/uL (ref 0–200)
Basophils Relative: 0.6 %
Eosinophils Absolute: 150 cells/uL (ref 15–500)
Eosinophils Relative: 1.9 %
HCT: 36.2 % (ref 35.0–45.0)
Hemoglobin: 12.2 g/dL (ref 11.7–15.5)
Lymphs Abs: 1296 cells/uL (ref 850–3900)
MCH: 33.3 pg — ABNORMAL HIGH (ref 27.0–33.0)
MCHC: 33.7 g/dL (ref 32.0–36.0)
MCV: 98.9 fL (ref 80.0–100.0)
MPV: 9.7 fL (ref 7.5–12.5)
Monocytes Relative: 4.8 %
Neutro Abs: 6028 cells/uL (ref 1500–7800)
Neutrophils Relative %: 76.3 %
Platelets: 292 10*3/uL (ref 140–400)
RBC: 3.66 10*6/uL — ABNORMAL LOW (ref 3.80–5.10)
RDW: 12.2 % (ref 11.0–15.0)
Total Lymphocyte: 16.4 %
WBC: 7.9 10*3/uL (ref 3.8–10.8)

## 2022-08-03 LAB — VITAMIN D 1,25 DIHYDROXY
Vitamin D 1, 25 (OH)2 Total: 39 pg/mL (ref 18–72)
Vitamin D2 1, 25 (OH)2: <8 pg/mL
Vitamin D3 1, 25 (OH)2: 39 pg/mL

## 2022-08-03 LAB — LIPID PANEL
Cholesterol: 241 mg/dL — ABNORMAL HIGH (ref <200)
HDL: 74 mg/dL (ref >=50)
LDL Cholesterol (Calc): 137 mg/dL (calc) — ABNORMAL HIGH
Non-HDL Cholesterol (Calc): 167 mg/dL (calc) — ABNORMAL HIGH (ref <130)
Total CHOL/HDL Ratio: 3.3 (calc) (ref <5.0)
Triglycerides: 166 mg/dL — ABNORMAL HIGH (ref <150)

## 2022-08-03 LAB — LITHIUM LEVEL: Lithium Lvl: 0.4 mmol/L — ABNORMAL LOW (ref 0.6–1.2)

## 2022-08-03 LAB — SEDIMENTATION RATE: Sed Rate: 6 mm/h (ref 0–30)

## 2022-08-03 LAB — C-REACTIVE PROTEIN: CRP: <3 mg/L (ref <8.0)

## 2022-08-03 LAB — VITAMIN B12: Vitamin B-12: 466 pg/mL (ref 200–1100)

## 2022-08-03 LAB — TSH: TSH: 2.08 mIU/L (ref 0.40–4.50)

## 2022-08-06 DIAGNOSIS — R29898 Other symptoms and signs involving the musculoskeletal system: Secondary | ICD-10-CM | POA: Diagnosis not present

## 2022-08-06 DIAGNOSIS — H26492 Other secondary cataract, left eye: Secondary | ICD-10-CM | POA: Diagnosis not present

## 2022-08-06 DIAGNOSIS — M6281 Muscle weakness (generalized): Secondary | ICD-10-CM | POA: Diagnosis not present

## 2022-08-09 DIAGNOSIS — M6281 Muscle weakness (generalized): Secondary | ICD-10-CM | POA: Diagnosis not present

## 2022-08-09 DIAGNOSIS — R29898 Other symptoms and signs involving the musculoskeletal system: Secondary | ICD-10-CM | POA: Diagnosis not present

## 2022-08-13 DIAGNOSIS — M6281 Muscle weakness (generalized): Secondary | ICD-10-CM | POA: Diagnosis not present

## 2022-08-13 DIAGNOSIS — R29898 Other symptoms and signs involving the musculoskeletal system: Secondary | ICD-10-CM | POA: Diagnosis not present

## 2022-08-16 DIAGNOSIS — R41841 Cognitive communication deficit: Secondary | ICD-10-CM | POA: Diagnosis not present

## 2022-08-16 DIAGNOSIS — M6281 Muscle weakness (generalized): Secondary | ICD-10-CM | POA: Diagnosis not present

## 2022-08-16 DIAGNOSIS — R29898 Other symptoms and signs involving the musculoskeletal system: Secondary | ICD-10-CM | POA: Diagnosis not present

## 2022-08-20 DIAGNOSIS — M6281 Muscle weakness (generalized): Secondary | ICD-10-CM | POA: Diagnosis not present

## 2022-08-20 DIAGNOSIS — R29898 Other symptoms and signs involving the musculoskeletal system: Secondary | ICD-10-CM | POA: Diagnosis not present

## 2022-08-20 DIAGNOSIS — R41841 Cognitive communication deficit: Secondary | ICD-10-CM | POA: Diagnosis not present

## 2022-08-23 DIAGNOSIS — R41841 Cognitive communication deficit: Secondary | ICD-10-CM | POA: Diagnosis not present

## 2022-08-23 DIAGNOSIS — M6281 Muscle weakness (generalized): Secondary | ICD-10-CM | POA: Diagnosis not present

## 2022-08-23 DIAGNOSIS — R29898 Other symptoms and signs involving the musculoskeletal system: Secondary | ICD-10-CM | POA: Diagnosis not present

## 2022-08-27 DIAGNOSIS — M47812 Spondylosis without myelopathy or radiculopathy, cervical region: Secondary | ICD-10-CM | POA: Diagnosis not present

## 2022-08-27 DIAGNOSIS — R519 Headache, unspecified: Secondary | ICD-10-CM | POA: Diagnosis not present

## 2022-08-27 DIAGNOSIS — G243 Spasmodic torticollis: Secondary | ICD-10-CM | POA: Diagnosis not present

## 2022-08-28 DIAGNOSIS — H04123 Dry eye syndrome of bilateral lacrimal glands: Secondary | ICD-10-CM | POA: Diagnosis not present

## 2022-08-30 DIAGNOSIS — M6281 Muscle weakness (generalized): Secondary | ICD-10-CM | POA: Diagnosis not present

## 2022-08-30 DIAGNOSIS — R41841 Cognitive communication deficit: Secondary | ICD-10-CM | POA: Diagnosis not present

## 2022-08-30 DIAGNOSIS — R29898 Other symptoms and signs involving the musculoskeletal system: Secondary | ICD-10-CM | POA: Diagnosis not present

## 2022-09-02 ENCOUNTER — Other Ambulatory Visit: Payer: Self-pay | Admitting: Nurse Practitioner

## 2022-09-03 DIAGNOSIS — M6281 Muscle weakness (generalized): Secondary | ICD-10-CM | POA: Diagnosis not present

## 2022-09-03 DIAGNOSIS — R29898 Other symptoms and signs involving the musculoskeletal system: Secondary | ICD-10-CM | POA: Diagnosis not present

## 2022-09-03 DIAGNOSIS — R41841 Cognitive communication deficit: Secondary | ICD-10-CM | POA: Diagnosis not present

## 2022-09-06 DIAGNOSIS — M6281 Muscle weakness (generalized): Secondary | ICD-10-CM | POA: Diagnosis not present

## 2022-09-06 DIAGNOSIS — R29898 Other symptoms and signs involving the musculoskeletal system: Secondary | ICD-10-CM | POA: Diagnosis not present

## 2022-09-06 DIAGNOSIS — R41841 Cognitive communication deficit: Secondary | ICD-10-CM | POA: Diagnosis not present

## 2022-09-10 DIAGNOSIS — R41841 Cognitive communication deficit: Secondary | ICD-10-CM | POA: Diagnosis not present

## 2022-09-10 DIAGNOSIS — M6281 Muscle weakness (generalized): Secondary | ICD-10-CM | POA: Diagnosis not present

## 2022-09-10 DIAGNOSIS — R29898 Other symptoms and signs involving the musculoskeletal system: Secondary | ICD-10-CM | POA: Diagnosis not present

## 2022-09-13 DIAGNOSIS — R29898 Other symptoms and signs involving the musculoskeletal system: Secondary | ICD-10-CM | POA: Diagnosis not present

## 2022-09-13 DIAGNOSIS — R41841 Cognitive communication deficit: Secondary | ICD-10-CM | POA: Diagnosis not present

## 2022-09-13 DIAGNOSIS — M6281 Muscle weakness (generalized): Secondary | ICD-10-CM | POA: Diagnosis not present

## 2022-09-17 ENCOUNTER — Other Ambulatory Visit: Payer: Self-pay | Admitting: Family Medicine

## 2022-09-17 DIAGNOSIS — M6281 Muscle weakness (generalized): Secondary | ICD-10-CM | POA: Diagnosis not present

## 2022-09-17 DIAGNOSIS — F304 Manic episode in full remission: Secondary | ICD-10-CM

## 2022-09-17 DIAGNOSIS — R29898 Other symptoms and signs involving the musculoskeletal system: Secondary | ICD-10-CM | POA: Diagnosis not present

## 2022-09-17 DIAGNOSIS — R41841 Cognitive communication deficit: Secondary | ICD-10-CM | POA: Diagnosis not present

## 2022-09-17 NOTE — Telephone Encounter (Signed)
Pharmacy requested refill.  Epic LR: 06/04/2022 No Contract on File. Added note to upcoming appointment.   Pended Rx and sent to Dr. Jacquenette Shone for approval.

## 2022-09-19 ENCOUNTER — Encounter: Payer: Self-pay | Admitting: Nurse Practitioner

## 2022-09-19 ENCOUNTER — Non-Acute Institutional Stay: Payer: Medicare PPO | Admitting: Nurse Practitioner

## 2022-09-19 VITALS — BP 122/88 | HR 50 | Temp 96.4°F | Resp 18 | Ht 61.0 in | Wt 123.2 lb

## 2022-09-19 DIAGNOSIS — E785 Hyperlipidemia, unspecified: Secondary | ICD-10-CM | POA: Diagnosis not present

## 2022-09-19 DIAGNOSIS — G243 Spasmodic torticollis: Secondary | ICD-10-CM

## 2022-09-19 DIAGNOSIS — R1312 Dysphagia, oropharyngeal phase: Secondary | ICD-10-CM

## 2022-09-19 DIAGNOSIS — I10 Essential (primary) hypertension: Secondary | ICD-10-CM

## 2022-09-19 DIAGNOSIS — R001 Bradycardia, unspecified: Secondary | ICD-10-CM

## 2022-09-19 DIAGNOSIS — R4189 Other symptoms and signs involving cognitive functions and awareness: Secondary | ICD-10-CM

## 2022-09-19 DIAGNOSIS — M159 Polyosteoarthritis, unspecified: Secondary | ICD-10-CM

## 2022-09-19 DIAGNOSIS — R350 Frequency of micturition: Secondary | ICD-10-CM

## 2022-09-19 DIAGNOSIS — K219 Gastro-esophageal reflux disease without esophagitis: Secondary | ICD-10-CM

## 2022-09-19 DIAGNOSIS — F304 Manic episode in full remission: Secondary | ICD-10-CM

## 2022-09-19 DIAGNOSIS — L84 Corns and callosities: Secondary | ICD-10-CM

## 2022-09-19 DIAGNOSIS — K5901 Slow transit constipation: Secondary | ICD-10-CM

## 2022-09-19 DIAGNOSIS — I83892 Varicose veins of left lower extremities with other complications: Secondary | ICD-10-CM

## 2022-09-19 MED ORDER — MIRABEGRON ER 25 MG PO TB24
25.0000 mg | ORAL_TABLET | Freq: Every day | ORAL | 0 refills | Status: DC
Start: 2022-09-19 — End: 2023-08-26

## 2022-09-19 NOTE — Assessment & Plan Note (Signed)
LDL 137 07/30/22, on diet, allergic to statin.

## 2022-09-19 NOTE — Assessment & Plan Note (Signed)
Stable, continue Metamucil, MiraLax,  effective.

## 2022-09-19 NOTE — Assessment & Plan Note (Signed)
GI evaluated, EGD 03/25/22 wnl, working with ST, thicken liquids.

## 2022-09-19 NOTE — Assessment & Plan Note (Signed)
Every 2-3 hours at night, denied discomfort, dysuria, urinary urgency, desires try Myrbetriq 25mg  qd

## 2022-09-19 NOTE — Assessment & Plan Note (Signed)
f/u endocrinology, Ca 10.7 04/09/21,  PTH 79 01/12/20. GDR of Lithium may help 

## 2022-09-19 NOTE — Assessment & Plan Note (Signed)
blood pressure is controlled on Losartan 

## 2022-09-19 NOTE — Assessment & Plan Note (Signed)
 12/2019 MRI brain showed chronic microvascular ischemic changes, no acute disease with mild cortical atrophy, TSH 2.08, Vit B12 466 07/30/22

## 2022-09-19 NOTE — Assessment & Plan Note (Signed)
stable, on Omeprazole, Hgb 12.2 07/30/22

## 2022-09-19 NOTE — Assessment & Plan Note (Signed)
takes  Tylenol, s/p R knee arthroplasty.  

## 2022-09-19 NOTE — Assessment & Plan Note (Addendum)
stable, on Lithium, GDR due to bradycardia, takes Alprazolam too, TSH 2.08 07/30/22. Declined Dr Hyacinth Meeker started low dose of Depakote. Lithium level 0.4 07/30/22

## 2022-09-19 NOTE — Assessment & Plan Note (Signed)
LLE negative DVT venous US, L>R. Medial thickened swelling area about her palm sized, mild warmth, redness, and indurated area for a few month. Varicose veins LLE not new.  

## 2022-09-19 NOTE — Progress Notes (Signed)
Location:  [facility]   Place of Service:    Provider: Chipper Oman NP  Code Status: DNR Goals of Care:     09/19/2022   10:40 AM  Advanced Directives  Does Patient Have a Medical Advance Directive? Yes  Type of Advance Directive Healthcare Power of Attorney  Does patient want to make changes to medical advance directive? No - Patient declined  Copy of Healthcare Power of Attorney in Chart? Yes - validated most recent copy scanned in chart (See row information)     Chief Complaint  Patient presents with   Acute Visit    sore on bottom of toe on left foot   Immunizations    DTAP, Influenza and Covid vaccine.    HPI: Patient is a 82 y.o. female seen today for medical management of chronic diseases.       Dry mouth syndrome, resolved after dentist recommend mouth wash             Persisted running nose, facial pressure, sore throat, underwent ENT evaluation              Esophageal candidiasis, recurrent thrush identified on swallow study, FEES showed diffused white secretion coating areas of white plaques throughout pharynx. The patient c/o sore in buccal sides of tongue and in her throat when swallows. Treated with Diflucan in the past, underwent GI evaluation, EGD 03/25/22 normal findings. Last treated with Diflucan 300mg  x1 04/10/22, then followed 200mg  qd x1 wk, may repeat x1.                Edema, LLE negative DVT venous US, L>R. Medial thickened swelling area about her palm sized, mild warmth, redness, and indurated area for a few month. Varicose veins LLE not new.  Tinnitus, underwent eval of  ENT, suggested music background.              Hx of GIST of small intestine s/p resection. MiraLax, Psyllium              Dysphagia, GI evaluated, EGD 03/25/22 wnl, working with ST, thicken liquids.              Constipation, Metamucil, MiraLax,  effective.              GERD, stable, on Omeprazole, Hgb 12.2 07/30/22             HTN, blood pressure is controlled on Losartan              Bipolar disorder, stable, on Lithium, GDR due to bradycardia, takes Alprazolam too, TSH 2.08 07/30/22. Declined Dr Hyacinth Meeker started low dose of Depakote. Lithium level 0.4 07/30/22             Bradycardia, improved from 40s to 50s. Hx of normalized HR after Tylenol PM dc'd, GDR of Lithium helped too.              Hypercalcemia, f/u endocrinology, Ca 10.7 04/09/21,  PTH 79 01/12/20. GDR of Lithium may help             OA, takes  Tylenol, s/p R knee arthroplasty.             Cervical degenerative changes/dystonia, better, CT/MRI 12/2019 ruled out acute process, aches in neck R>L travels to the back of head, comes and goes, not disabling, f/u Neurosurgery. Failed Cymbalta, Gabapentin, Robaxin, Ultracet. s/p Botulinum Toxin inj by Neurology. ESR 9, CRP 2.7 10/09/20(ESR 6 07/30/22). The patient stated Alprazolam and Tylenol are  kind of effective. Improved on Topamax, but associated with weight loss.  No Parkinson's disease, took her off Sinemet per Neurology.              Cognitive impairment: 12/2019 MRI brain showed chronic microvascular ischemic changes, no acute disease with mild cortical atrophy, TSH 2.08, Vit B12 466 07/30/22             Hyperlipidemia, LDL 137 07/30/22, on diet, allergic to statin.     Past Medical History:  Diagnosis Date   Anemia    Anxiety    Arthritis    Cardiac conduction disorder 03/30/2012   Overview:  STORY: ETT 03/09/2012 Echo 03/07/2012 normal Dr Jacinto Halim, bradycardia felt due to glaucoma eye drops   Cervical dystonia    Diverticulosis of colon    DOE (dyspnea on exertion) 03/25/2018   Gastroesophageal cancer (HCC)    GERD (gastroesophageal reflux disease)    GIST (gastrointestinal stroma tumor), malignant, colon (HCC)    Glaucoma    Heart murmur    History of colon polyps 10/24/2008   Hypertension    Major neurocognitive disorder due to Parkinson's disease, possible    Tremors possible parkinsons   Manic disorder, single episode, in full remission (HCC) 12/01/2009   Sixth  nerve palsy     Past Surgical History:  Procedure Laterality Date   BILATERAL SALPINGOOPHORECTOMY  09/22/2007   CATARACT EXTRACTION Bilateral    ESOPHAGOGASTRODUODENOSCOPY (EGD) WITH PROPOFOL N/A 03/25/2022   Procedure: ESOPHAGOGASTRODUODENOSCOPY (EGD) WITH PROPOFOL;  Surgeon: Sherrilyn Rist, MD;  Location: [facility];  Service: Gastroenterology;  Laterality: N/A;   Gastrointestinal Stroma Tumor,  Other  1960   GIST Surgery   ILEOCECETOMY  09/22/2007   OVARIAN CYST REMOVAL Right 1967   SMALL INTESTINE SURGERY  09/22/2007   TOTAL KNEE ARTHROPLASTY Right 10/05/2019   Procedure: RIGHT TOTAL KNEE ARTHROPLASTY;  Surgeon: Cammy Copa, MD;  Location: [facility] OR;  Service: Orthopedics;  Laterality: Right;   TOTAL VAGINAL HYSTERECTOMY  1986   Fibroids    Allergies  Allergen Reactions   Lisinopril Cough   Penicillins Itching    50 years ago   Adhesive [Tape] Itching   Atorvastatin Itching   Dilaudid [Hydromorphone Hcl] Itching   Hydromorphone Itching    Allergies as of 09/19/2022       Reactions   Lisinopril Cough   Penicillins Itching   50 years ago   Adhesive [tape] Itching   Atorvastatin Itching   Dilaudid [hydromorphone Hcl] Itching   Hydromorphone Itching        Medication List        Accurate as of September 19, 2022  4:38 PM. If you have any questions, ask your nurse or doctor.          ALPRAZolam 0.5 MG tablet Commonly known as: XANAX TAKE ONE TABLET BY MOUTH TWICE DAILY AS NEEDED FOR ANXIETY   aspirin 81 MG chewable tablet Chew 81 mg by mouth daily.   Biotene Dry Mouth Lozg Use as directed 1 tablet in the mouth or throat 4 (four) times daily as needed.   bismuth subsalicylate 262 MG/15ML suspension Commonly known as: PEPTO BISMOL Take 30 mLs by mouth every 6 (six) hours as needed for diarrhea or loose stools.   clindamycin 150 MG capsule Commonly known as: CLEOCIN Take 600mg  1 hour prior to dental procedure   fluconazole 200 MG  tablet Commonly known as: DIFLUCAN Take 1 tablet (200 mg total) by mouth daily.   latanoprost 0.005 %  ophthalmic solution Commonly known as: XALATAN Place 1 drop into both eyes at bedtime.   lithium carbonate 150 MG capsule Take 150 mg by mouth 3 (three) times daily with meals.   losartan 50 MG tablet Commonly known as: COZAAR TAKE ONE TABLET BY MOUTH ONCE DAILY   MAGNESIUM PO Take 400 mg by mouth daily.   mirabegron ER 25 MG Tb24 tablet Commonly known as: Myrbetriq Take 1 tablet (25 mg total) by mouth daily. Started by: Jolynda Townley X Cristhian Vanhook   multivitamin tablet Take 1 tablet by mouth daily.   omeprazole 20 MG capsule Commonly known as: PRILOSEC Take 1 capsule (20 mg total) by mouth daily.   polyethylene glycol 17 g packet Commonly known as: MIRALAX / GLYCOLAX Take 17 g by mouth daily.   pravastatin 10 MG tablet Commonly known as: PRAVACHOL Take 0.5 tablets (5 mg total) by mouth daily.   Restasis 0.05 % ophthalmic emulsion Generic drug: cycloSPORINE Place 1 drop into both eyes 2 (two) times daily.   topiramate 25 MG tablet Commonly known as: TOPAMAX TAKE ONE TABLET BY MOUTH TWICE DAILY IN THE MORNING AND IN THE EVENING   triamcinolone ointment 0.5 % Commonly known as: KENALOG Apply 1 Application topically 2 (two) times daily as needed (place on leg).        Review of Systems:  Review of Systems  Health Maintenance  Topic Date Due   DTaP/Tdap/Td (2 - Td or Tdap) 08/19/2021   INFLUENZA VACCINE  08/15/2022   COVID-19 Vaccine (5 - 2023-24 season) 09/15/2022   Medicare Annual Wellness (AWV)  02/08/2023   Pneumonia Vaccine 80+ Years old  Completed   DEXA SCAN  Completed   Zoster Vaccines- Shingrix  Completed   HPV VACCINES  Aged Out    Physical Exam: Vitals:   09/19/22 1040 09/19/22 1427  BP: 122/88   Pulse: (!) 49 (!) 50  Resp: 18   Temp: (!) 96.4 F (35.8 C)   SpO2: 99%   Weight: 123 lb 3.2 oz (55.9 kg)   Height: 5\' 1"  (1.549 m)    Body mass index  is 23.28 kg/m. Physical Exam  Labs reviewed: Basic Metabolic Panel: Recent Labs    07/30/22 0708  TSH 2.08   Liver Function Tests: No results for input(s): "AST", "ALT", "ALKPHOS", "BILITOT", "PROT", "ALBUMIN" in the last 8760 hours. No results for input(s): "LIPASE", "AMYLASE" in the last 8760 hours. No results for input(s): "AMMONIA" in the last 8760 hours. CBC: Recent Labs    07/30/22 0000 07/30/22 0708  WBC 7.9 7.9  NEUTROABS 6,028.00 6,028  HGB 12.2 12.2  HCT 36 36.2  MCV  --  98.9  PLT 292 292   Lipid Panel: Recent Labs    07/30/22 0708  CHOL 241*  HDL 74  LDLCALC 137*  TRIG 166*  CHOLHDL 3.3   No results found for: "HGBA1C"  Procedures since last visit: No results found.  Assessment/Plan  Essential (primary) hypertension  blood pressure is controlled on Losartan  Bipolar I disorder, single manic episode, in full remission (HCC) stable, on Lithium, GDR due to bradycardia, takes Alprazolam too, TSH 2.08 07/30/22. Declined Dr Hyacinth Meeker started low dose of Depakote. Lithium level 0.4 07/30/22  Bradycardia improved from 40s to 50s. Hx of normalized HR after Tylenol PM dc'd, GDR of Lithium helped too.   Hypercalcemia f/u endocrinology, Ca 10.7 04/09/21,  PTH 79 01/12/20. GDR of Lithium may help  Arthritis, degenerative  takes  Tylenol, s/p R knee arthroplasty.   Cervical  dystonia  better, CT/MRI 12/2019 ruled out acute process, aches in neck R>L travels to the back of head, comes and goes, not disabling, f/u Neurosurgery. Failed Cymbalta, Gabapentin, Robaxin, Ultracet. s/p Botulinum Toxin inj by Neurology. ESR 9, CRP 2.7 10/09/20(ESR 6 07/30/22). The patient stated Alprazolam and Tylenol are kind of effective. Improved on Topamax, but associated with weight loss.  No Parkinson's disease, took her off Sinemet per Neurology.   Cognitive impairment 12/2019 MRI brain showed chronic microvascular ischemic changes, no acute disease with mild cortical atrophy, TSH  2.08, Vit B12 466 07/30/22  HLD (hyperlipidemia) LDL 137 07/30/22, on diet, allergic to statin.   GERD (gastroesophageal reflux disease) stable, on Omeprazole, Hgb 12.2 07/30/22  Slow transit constipation Stable, continue Metamucil, MiraLax,  effective.   Dysphagia GI evaluated, EGD 03/25/22 wnl, working with ST, thicken liquids.   Varicose veins of left leg with edema LLE negative DVT venous US, L>R. Medial thickened swelling area about her palm sized, mild warmth, redness, and indurated area for a few month. Varicose veins LLE not new.   Corn of foot Left 1st MTJ, 3rd MTJ plantar aspect, shaved.   Urinary frequency Every 2-3 hours at night, denied discomfort, dysuria, urinary urgency, desires try Myrbetriq 25mg  qd   Labs/tests ordered:  none  Next appt:  10/17/2022

## 2022-09-19 NOTE — Assessment & Plan Note (Signed)
improved from 40s to 50s. Hx of normalized HR after Tylenol PM dc'd, GDR of Lithium helped too. 

## 2022-09-19 NOTE — Assessment & Plan Note (Signed)
Left 1st MTJ, 3rd MTJ plantar aspect, shaved.

## 2022-09-19 NOTE — Assessment & Plan Note (Signed)
better, CT/MRI 12/2019 ruled out acute process, aches in neck R>L travels to the back of head, comes and goes, not disabling, f/u Neurosurgery. Failed Cymbalta, Gabapentin, Robaxin, Ultracet. s/p Botulinum Toxin inj by Neurology. ESR 9, CRP 2.7 10/09/20(ESR 6 07/30/22). The patient stated Alprazolam and Tylenol are kind of effective. Improved on Topamax, but associated with weight loss.  No Parkinson's disease, took her off Sinemet per Neurology.

## 2022-09-20 DIAGNOSIS — R29898 Other symptoms and signs involving the musculoskeletal system: Secondary | ICD-10-CM | POA: Diagnosis not present

## 2022-09-20 DIAGNOSIS — M6281 Muscle weakness (generalized): Secondary | ICD-10-CM | POA: Diagnosis not present

## 2022-09-20 DIAGNOSIS — R41841 Cognitive communication deficit: Secondary | ICD-10-CM | POA: Diagnosis not present

## 2022-09-23 DIAGNOSIS — H04123 Dry eye syndrome of bilateral lacrimal glands: Secondary | ICD-10-CM | POA: Diagnosis not present

## 2022-09-23 DIAGNOSIS — G245 Blepharospasm: Secondary | ICD-10-CM | POA: Diagnosis not present

## 2022-09-24 DIAGNOSIS — R41841 Cognitive communication deficit: Secondary | ICD-10-CM | POA: Diagnosis not present

## 2022-09-24 DIAGNOSIS — M6281 Muscle weakness (generalized): Secondary | ICD-10-CM | POA: Diagnosis not present

## 2022-09-24 DIAGNOSIS — R29898 Other symptoms and signs involving the musculoskeletal system: Secondary | ICD-10-CM | POA: Diagnosis not present

## 2022-09-26 ENCOUNTER — Encounter: Payer: Medicare PPO | Admitting: Nurse Practitioner

## 2022-10-01 DIAGNOSIS — M6281 Muscle weakness (generalized): Secondary | ICD-10-CM | POA: Diagnosis not present

## 2022-10-01 DIAGNOSIS — R29898 Other symptoms and signs involving the musculoskeletal system: Secondary | ICD-10-CM | POA: Diagnosis not present

## 2022-10-01 DIAGNOSIS — R41841 Cognitive communication deficit: Secondary | ICD-10-CM | POA: Diagnosis not present

## 2022-10-03 ENCOUNTER — Encounter: Payer: Medicare PPO | Admitting: Nurse Practitioner

## 2022-10-04 DIAGNOSIS — R41841 Cognitive communication deficit: Secondary | ICD-10-CM | POA: Diagnosis not present

## 2022-10-04 DIAGNOSIS — M6281 Muscle weakness (generalized): Secondary | ICD-10-CM | POA: Diagnosis not present

## 2022-10-04 DIAGNOSIS — R29898 Other symptoms and signs involving the musculoskeletal system: Secondary | ICD-10-CM | POA: Diagnosis not present

## 2022-10-08 DIAGNOSIS — R41841 Cognitive communication deficit: Secondary | ICD-10-CM | POA: Diagnosis not present

## 2022-10-08 DIAGNOSIS — M6281 Muscle weakness (generalized): Secondary | ICD-10-CM | POA: Diagnosis not present

## 2022-10-08 DIAGNOSIS — R29898 Other symptoms and signs involving the musculoskeletal system: Secondary | ICD-10-CM | POA: Diagnosis not present

## 2022-10-11 DIAGNOSIS — R29898 Other symptoms and signs involving the musculoskeletal system: Secondary | ICD-10-CM | POA: Diagnosis not present

## 2022-10-11 DIAGNOSIS — R41841 Cognitive communication deficit: Secondary | ICD-10-CM | POA: Diagnosis not present

## 2022-10-11 DIAGNOSIS — M6281 Muscle weakness (generalized): Secondary | ICD-10-CM | POA: Diagnosis not present

## 2022-10-15 DIAGNOSIS — M6281 Muscle weakness (generalized): Secondary | ICD-10-CM | POA: Diagnosis not present

## 2022-10-15 DIAGNOSIS — R29898 Other symptoms and signs involving the musculoskeletal system: Secondary | ICD-10-CM | POA: Diagnosis not present

## 2022-10-15 DIAGNOSIS — R41841 Cognitive communication deficit: Secondary | ICD-10-CM | POA: Diagnosis not present

## 2022-10-17 ENCOUNTER — Encounter: Payer: Self-pay | Admitting: Nurse Practitioner

## 2022-10-17 ENCOUNTER — Non-Acute Institutional Stay: Payer: Medicare PPO | Admitting: Nurse Practitioner

## 2022-10-17 VITALS — BP 126/70 | HR 50 | Temp 98.0°F

## 2022-10-17 DIAGNOSIS — H9313 Tinnitus, bilateral: Secondary | ICD-10-CM

## 2022-10-17 DIAGNOSIS — I10 Essential (primary) hypertension: Secondary | ICD-10-CM | POA: Diagnosis not present

## 2022-10-17 DIAGNOSIS — K5901 Slow transit constipation: Secondary | ICD-10-CM | POA: Diagnosis not present

## 2022-10-17 DIAGNOSIS — R4189 Other symptoms and signs involving cognitive functions and awareness: Secondary | ICD-10-CM | POA: Diagnosis not present

## 2022-10-17 DIAGNOSIS — J309 Allergic rhinitis, unspecified: Secondary | ICD-10-CM

## 2022-10-17 DIAGNOSIS — F304 Manic episode in full remission: Secondary | ICD-10-CM

## 2022-10-17 DIAGNOSIS — E785 Hyperlipidemia, unspecified: Secondary | ICD-10-CM

## 2022-10-17 DIAGNOSIS — F411 Generalized anxiety disorder: Secondary | ICD-10-CM

## 2022-10-17 DIAGNOSIS — R1312 Dysphagia, oropharyngeal phase: Secondary | ICD-10-CM

## 2022-10-17 DIAGNOSIS — M150 Primary generalized (osteo)arthritis: Secondary | ICD-10-CM | POA: Diagnosis not present

## 2022-10-17 DIAGNOSIS — K219 Gastro-esophageal reflux disease without esophagitis: Secondary | ICD-10-CM | POA: Diagnosis not present

## 2022-10-17 DIAGNOSIS — R682 Dry mouth, unspecified: Secondary | ICD-10-CM

## 2022-10-17 DIAGNOSIS — C49A3 Gastrointestinal stromal tumor of small intestine: Secondary | ICD-10-CM

## 2022-10-17 DIAGNOSIS — R001 Bradycardia, unspecified: Secondary | ICD-10-CM

## 2022-10-17 DIAGNOSIS — I83892 Varicose veins of left lower extremities with other complications: Secondary | ICD-10-CM

## 2022-10-17 MED ORDER — DULOXETINE HCL 20 MG PO CPEP: 20.0000 mg | ORAL_CAPSULE | Freq: Every day | ORAL | 0 refills | Status: DC

## 2022-10-17 MED ORDER — PRAVASTATIN SODIUM 10 MG PO TABS
10.0000 mg | ORAL_TABLET | Freq: Every day | ORAL | 0 refills | Status: DC
Start: 2022-10-17 — End: 2023-01-13

## 2022-10-17 NOTE — Assessment & Plan Note (Signed)
stable, on Omeprazole, Hgb 12.2 07/30/22

## 2022-10-17 NOTE — Assessment & Plan Note (Signed)
12/2019 MRI brain showed chronic microvascular ischemic changes, no acute disease with mild cortical atrophy, TSH 2.08, Vit B12 466 07/30/22

## 2022-10-17 NOTE — Assessment & Plan Note (Signed)
blood pressure is controlled on Losartan 

## 2022-10-17 NOTE — Assessment & Plan Note (Signed)
Stable, Metamucil, MiraLax,  effective.

## 2022-10-17 NOTE — Assessment & Plan Note (Signed)
underwent eval of  ENT, suggested music background.

## 2022-10-17 NOTE — Assessment & Plan Note (Signed)
Persisted running nose, facial pressure, sore throat, underwent ENT evaluation.

## 2022-10-17 NOTE — Assessment & Plan Note (Signed)
takes  Tylenol, s/p R knee arthroplasty.             Cervical degenerative changes/dystonia, better, CT/MRI 12/2019 ruled out acute process, aches in neck R>L travels to the back of head, comes and goes, not disabling, f/u Neurosurgery. Failed Cymbalta, Gabapentin, Robaxin, Ultracet. s/p Botulinum Toxin inj by Neurology. ESR 9, CRP 2.7 10/09/20(ESR 6 07/30/22). The patient stated Alprazolam and Tylenol are kind of effective. Improved on Topamax, but associated with weight loss.  No Parkinson's disease, took her off Sinemet per Neurology.

## 2022-10-17 NOTE — Assessment & Plan Note (Signed)
f/u endocrinology, Ca 10.7 04/09/21,  PTH 79 01/12/20. GDR of Lithium may help 

## 2022-10-17 NOTE — Assessment & Plan Note (Signed)
LLE negative DVT venous US, L>R. Medial thickened swelling area about her palm sized, mild warmth, redness, and indurated area for a few month. Varicose veins LLE not new.  

## 2022-10-17 NOTE — Assessment & Plan Note (Signed)
improved from 40s to 50s. Hx of normalized HR after Tylenol PM dc'd, GDR of Lithium helped too. 

## 2022-10-17 NOTE — Assessment & Plan Note (Signed)
stable, on Lithium, GDR due to bradycardia, takes Alprazolam too, TSH 2.08 07/30/22. Declined Dr Hyacinth Meeker started low dose of Depakote. Lithium level 0.4 07/30/22

## 2022-10-17 NOTE — Assessment & Plan Note (Addendum)
LDL 137 07/30/22,   tolerated Pravastatin 5mg  ok, consider to increase to 10mg  every day.

## 2022-10-17 NOTE — Assessment & Plan Note (Signed)
resolved after dentist recommend mouth wash

## 2022-10-17 NOTE — Progress Notes (Signed)
Location:   Clinic [facility]   Place of Service:  Clinic (12) Provider: Chipper Oman NP  Code Status: DNR Goals of Care:     10/17/2022   11:00 AM  Advanced Directives  Does Patient Have a Medical Advance Directive? Yes  Type of Advance Directive Healthcare Power of Attorney  Does patient want to make changes to medical advance directive? No - Patient declined  Copy of Healthcare Power of Attorney in Chart? Yes - validated most recent copy scanned in chart (See row information)     Chief Complaint  Patient presents with   Medical Management of Chronic Issues    3 month follow-up. Discuss need for td/tdap, flu vaccine, and covid booster. Sign treatment agreement for Xanax. Discuss alternative to Pravachol to avoid cutting pills in half. Weight not recorded due to scales stop working.     HPI: Patient is a 82 y.o. female seen today for medical management of chronic diseases.     Dry mouth syndrome, resolved after dentist recommend mouth wash             Persisted running nose, facial pressure, sore throat, underwent ENT evaluation              Esophageal candidiasis, recurrent thrush identified on swallow study, FEES showed diffused white secretion coating areas of white plaques throughout pharynx. The patient c/o sore in buccal sides of tongue and in her throat when swallows. Treated with Diflucan in the past, underwent GI evaluation, EGD 03/25/22 normal findings. Last treated with Diflucan 300mg  x1 04/10/22, then followed 200mg  qd x1 wk, may repeat x1.                Edema, LLE negative DVT venous US, L>R. Medial thickened swelling area about her palm sized, mild warmth, redness, and indurated area for a few month. Varicose veins LLE not new.  Tinnitus, underwent eval of  ENT, suggested music background.              Hx of GIST of small intestine s/p resection. MiraLax, Psyllium              Dysphagia, GI evaluated, EGD 03/25/22 wnl, working with ST, thicken liquids.               Constipation, Metamucil, MiraLax,  effective.              GERD, stable, on Omeprazole, Hgb 12.2 07/30/22             HTN, blood pressure is controlled on Losartan             Bipolar disorder, stable, on Lithium, GDR due to bradycardia, takes Alprazolam too, TSH 2.08 07/30/22. Declined Dr Hyacinth Meeker started low dose of Depakote. Lithium level 0.4 07/30/22             Bradycardia, improved from 40s to 50s. Hx of normalized HR after Tylenol PM dc'd, GDR of Lithium helped too.              Hypercalcemia, f/u endocrinology, Ca 10.7 04/09/21,  PTH 79 01/12/20. GDR of Lithium may help             OA, takes  Tylenol, s/p R knee arthroplasty.             Cervical degenerative changes/dystonia, better, CT/MRI 12/2019 ruled out acute process, aches in neck R>L travels to the back of head, comes and goes, not disabling, f/u Neurosurgery. Failed Cymbalta, Gabapentin, Robaxin, Ultracet. s/p  Botulinum Toxin inj by Neurology. ESR 9, CRP 2.7 10/09/20(ESR 6 07/30/22). The patient stated Alprazolam and Tylenol are kind of effective. Improved on Topamax, but associated with weight loss.  No Parkinson's disease, took her off Sinemet per Neurology. Currently taking inj q4hrs.              Cognitive impairment: 12/2019 MRI brain showed chronic microvascular ischemic changes, no acute disease with mild cortical atrophy, TSH 2.08, Vit B12 466 07/30/22             Hyperlipidemia, LDL 137 07/30/22, on diet, tolerated Pravastatin 5mg  ok, consider to increase to 10mg  every day.      Past Medical History:  Diagnosis Date   Anemia    Anxiety    Arthritis    Cardiac conduction disorder 03/30/2012   Overview:  STORY: ETT 03/09/2012 Echo 03/07/2012 normal Dr Jacinto Halim, bradycardia felt due to glaucoma eye drops   Cervical dystonia    Diverticulosis of colon    DOE (dyspnea on exertion) 03/25/2018   Gastroesophageal cancer (HCC)    GERD (gastroesophageal reflux disease)    GIST (gastrointestinal stroma tumor), malignant, colon (HCC)     Glaucoma    Heart murmur    History of colon polyps 10/24/2008   Hypertension    Major neurocognitive disorder due to Parkinson's disease, possible    Tremors possible parkinsons   Manic disorder, single episode, in full remission (HCC) 12/01/2009   Sixth nerve palsy     Past Surgical History:  Procedure Laterality Date   BILATERAL SALPINGOOPHORECTOMY  09/22/2007   CATARACT EXTRACTION Bilateral    ESOPHAGOGASTRODUODENOSCOPY (EGD) WITH PROPOFOL N/A 03/25/2022   Procedure: ESOPHAGOGASTRODUODENOSCOPY (EGD) WITH PROPOFOL;  Surgeon: Sherrilyn Rist, MD;  Location: [facility];  Service: Gastroenterology;  Laterality: N/A;   Gastrointestinal Stroma Tumor,  Other  1960   GIST Surgery   ILEOCECETOMY  09/22/2007   OVARIAN CYST REMOVAL Right 1967   SMALL INTESTINE SURGERY  09/22/2007   TOTAL KNEE ARTHROPLASTY Right 10/05/2019   Procedure: RIGHT TOTAL KNEE ARTHROPLASTY;  Surgeon: Cammy Copa, MD;  Location: [facility];  Service: Orthopedics;  Laterality: Right;   TOTAL VAGINAL HYSTERECTOMY  1986   Fibroids    Allergies  Allergen Reactions   Lisinopril Cough   Penicillins Itching    50 years ago   Adhesive [Tape] Itching   Atorvastatin Itching   Dilaudid [Hydromorphone Hcl] Itching   Hydromorphone Itching    Allergies as of 10/17/2022       Reactions   Lisinopril Cough   Penicillins Itching   50 years ago   Adhesive [tape] Itching   Atorvastatin Itching   Dilaudid [hydromorphone Hcl] Itching   Hydromorphone Itching        Medication List        Accurate as of October 17, 2022  3:35 PM. If you have any questions, ask your nurse or doctor.          STOP taking these medications    fluconazole 200 MG tablet Commonly known as: DIFLUCAN Stopped by: Brandan Glauber X Nancylee Gaines       TAKE these medications    ALPRAZolam 0.5 MG tablet Commonly known as: XANAX TAKE ONE TABLET BY MOUTH TWICE DAILY AS NEEDED FOR ANXIETY   aspirin 81 MG chewable tablet Chew 81 mg by mouth  daily.   Biotene Dry Mouth Lozg Use as directed 1 tablet in the mouth or throat 4 (four) times daily as needed.   bismuth subsalicylate 262  MG/15ML suspension Commonly known as: PEPTO BISMOL Take 30 mLs by mouth every 6 (six) hours as needed for diarrhea or loose stools.   clindamycin 150 MG capsule Commonly known as: CLEOCIN Take 600mg  1 hour prior to dental procedure   DULoxetine 20 MG capsule Commonly known as: Cymbalta Take 1 capsule (20 mg total) by mouth daily. Started by: Whisper Kurka X Emberly Tomasso   latanoprost 0.005 % ophthalmic solution Commonly known as: XALATAN Place 1 drop into both eyes at bedtime.   lithium carbonate 150 MG capsule Take 150 mg by mouth 2 (two) times daily with a meal.   losartan 50 MG tablet Commonly known as: COZAAR TAKE ONE TABLET BY MOUTH ONCE DAILY   MAGNESIUM PO Take 400 mg by mouth daily.   mirabegron ER 25 MG Tb24 tablet Commonly known as: Myrbetriq Take 1 tablet (25 mg total) by mouth daily.   multivitamin tablet Take 1 tablet by mouth daily.   omeprazole 20 MG capsule Commonly known as: PRILOSEC Take 1 capsule (20 mg total) by mouth daily.   polyethylene glycol 17 g packet Commonly known as: MIRALAX / GLYCOLAX Take 17 g by mouth daily.   pravastatin 10 MG tablet Commonly known as: PRAVACHOL Take 1 tablet (10 mg total) by mouth daily. What changed: how much to take Changed by: Kieara Schwark X [place] Espina   Restasis 0.05 % ophthalmic emulsion Generic drug: cycloSPORINE Place 1 drop into both eyes 2 (two) times daily.   topiramate 25 MG tablet Commonly known as: TOPAMAX TAKE ONE TABLET BY MOUTH TWICE DAILY IN THE MORNING AND IN THE EVENING   triamcinolone ointment 0.5 % Commonly known as: KENALOG Apply 1 Application topically 2 (two) times daily as needed (place on leg).        Review of Systems:  Review of Systems  Constitutional:  Negative for appetite change, fatigue and fever.  HENT:  Positive for hearing loss and rhinorrhea. Negative  for congestion, facial swelling, mouth sores, postnasal drip, sinus pressure, sore throat, tinnitus, trouble swallowing and voice change.        C/o sore buccal aspect tongue and in throat when swallows resolved.   Eyes:  Negative for visual disturbance.       Glaucoma, diplopia R+L lateral peripheral visual fields only.   Respiratory:  Negative for shortness of breath.        Occasionally DOE, hacking cough  Cardiovascular:  Positive for leg swelling.  Gastrointestinal:  Negative for abdominal pain and constipation.  Genitourinary:  Positive for frequency. Negative for dysuria and urgency.       Couple of times at night, no difficulty of returning asleep.   Musculoskeletal:  Positive for arthralgias and gait problem.       Walker.   Skin:  Negative for color change.  Neurological:  Negative for tremors, speech difficulty, light-headedness and headaches.       Comes and goes nature of the neck/occipital pain. Stiff neck. Pain the patent right knee, s/p TKR. Tremor is better since Sinemet.   Psychiatric/Behavioral:  Positive for sleep disturbance. Negative for confusion. The patient is nervous/anxious.        Better mood, feels not as anxious or depressive mood as prior.     Health Maintenance  Topic Date Due   DTaP/Tdap/Td (2 - Td or Tdap) 08/19/2021   INFLUENZA VACCINE  08/15/2022   COVID-19 Vaccine (5 - 2023-24 season) 09/15/2022   Medicare Annual Wellness (AWV)  02/08/2023   Pneumonia Vaccine 33+ Years old  Completed  DEXA SCAN  Completed   Zoster Vaccines- Shingrix  Completed   HPV VACCINES  Aged Out    Physical Exam: Vitals:   10/17/22 1100  BP: 126/70  Pulse: (!) 50  Temp: 98 F (36.7 C)  TempSrc: Temporal  SpO2: 99%   There is no height or weight on file to calculate BMI. Physical Exam Vitals and nursing note reviewed.  Constitutional:      Appearance: Normal appearance.  HENT:     Head: Normocephalic and atraumatic.     Nose: Nose normal.     Mouth/Throat:      Mouth: Mucous membranes are moist.     Pharynx: No oropharyngeal exudate or posterior oropharyngeal erythema.  Eyes:     Extraocular Movements: Extraocular movements intact.     Conjunctiva/sclera: Conjunctivae normal.     Pupils: Pupils are equal, round, and reactive to light.  Cardiovascular:     Rate and Rhythm: Regular rhythm. Bradycardia present.     Heart sounds: Murmur heard.     Comments: DP pulses present R+L. HR in 50s Pulmonary:     Effort: Pulmonary effort is normal.     Breath sounds: No rales.  Abdominal:     General: Bowel sounds are normal.     Palpations: Abdomen is soft.     Tenderness: There is no abdominal tenderness.  Musculoskeletal:     Cervical back: Normal range of motion and neck supple.     Right lower leg: Edema present.     Left lower leg: Edema present.     Comments: Chronic R knee pain is improved. S/p TKR right. Felt lateral right thigh muscle is tight.  Stiff neck, comes and goes neck/occipital pain. Trace edema BLE. Arthritic joint change fingers.   Skin:    General: Skin is warm and dry.     Comments: Improved the medial lower left leg a palm sized thickened, slightly reddened area, mild discomfort when palpated. DP present left foot. Scattered varicose veins left leg 05/09/22 medical left MTJ callous, plantar aspect 3rd toe callous, the left 2nd toe pain after injury, but no bruise, deformity, swelling, redness, able to PROM w/o pain.    Neurological:     General: No focal deficit present.     Mental Status: She is alert and oriented to person, place, and time. Mental status is at baseline.     Motor: No weakness.     Coordination: Coordination abnormal.     Gait: Gait abnormal.     Comments: Resting tremor in fingers, near resolution.   Psychiatric:        Mood and Affect: Mood normal.        Behavior: Behavior normal.        Thought Content: Thought content normal.     Labs reviewed: Basic Metabolic Panel: Recent Labs     07/30/22 0708  TSH 2.08   Liver Function Tests: No results for input(s): "AST", "ALT", "ALKPHOS", "BILITOT", "PROT", "ALBUMIN" in the last 8760 hours. No results for input(s): "LIPASE", "AMYLASE" in the last 8760 hours. No results for input(s): "AMMONIA" in the last 8760 hours. CBC: Recent Labs    07/30/22 0000 07/30/22 0708  WBC 7.9 7.9  NEUTROABS 6,028.00 6,028  HGB 12.2 12.2  HCT 36 36.2  MCV  --  98.9  PLT 292 292   Lipid Panel: Recent Labs    07/30/22 0708  CHOL 241*  HDL 74  LDLCALC 137*  TRIG 166*  CHOLHDL 3.3  No results found for: "HGBA1C"  Procedures since last visit: No results found.  Assessment/Plan  Bradycardia improved from 40s to 50s. Hx of normalized HR after Tylenol PM dc'd, GDR of Lithium helped too.   Hypercalcemia  f/u endocrinology, Ca 10.7 04/09/21,  PTH 79 01/12/20. GDR of Lithium may help  Arthritis, degenerative   takes  Tylenol, s/p R knee arthroplasty.             Cervical degenerative changes/dystonia, better, CT/MRI 12/2019 ruled out acute process, aches in neck R>L travels to the back of head, comes and goes, not disabling, f/u Neurosurgery. Failed Cymbalta, Gabapentin, Robaxin, Ultracet. s/p Botulinum Toxin inj by Neurology. ESR 9, CRP 2.7 10/09/20(ESR 6 07/30/22). The patient stated Alprazolam and Tylenol are kind of effective. Improved on Topamax, but associated with weight loss.  No Parkinson's disease, took her off Sinemet per Neurology.   Cognitive impairment 12/2019 MRI brain showed chronic microvascular ischemic changes, no acute disease with mild cortical atrophy, TSH 2.08, Vit B12 466 07/30/22  HLD (hyperlipidemia)  LDL 137 07/30/22,   tolerated Pravastatin 5mg  ok, consider to increase to 10mg  every day.   Bipolar I disorder, single manic episode, in full remission (HCC)  stable, on Lithium, GDR due to bradycardia, takes Alprazolam too, TSH 2.08 07/30/22. Declined Dr Hyacinth Meeker started low dose of Depakote. Lithium level 0.4  07/30/22  Essential (primary) hypertension blood pressure is controlled on Losartan  GERD (gastroesophageal reflux disease) stable, on Omeprazole, Hgb 12.2 07/30/22  Slow transit constipation Stable, Metamucil, MiraLax,  effective.   Dysphagia  GI evaluated, EGD 03/25/22 wnl, working with ST, thicken liquids.   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 MiraLax, Psyllium   Tinnitus of both ears underwent eval of  ENT, suggested music background.   Varicose veins of left leg with edema  LLE negative DVT venous US, L>R. Medial thickened swelling area about her palm sized, mild warmth, redness, and indurated area for a few month. Varicose veins LLE not new.   Dry mouth  resolved after dentist recommend mouth wash  Allergic sinusitis Persisted running nose, facial pressure, sore throat, underwent ENT evaluation  Anxiety state Continue Alprazolam as needed, will try Cymbalta 20mg  qd,    Labs/tests ordered:  none  Next appt:  3 months.

## 2022-10-17 NOTE — Assessment & Plan Note (Signed)
Continue Alprazolam as needed, will try Cymbalta 20mg  qd,

## 2022-10-17 NOTE — Assessment & Plan Note (Signed)
Hx of GIST of small intestine s/p resection. MiraLax, Psyllium

## 2022-10-17 NOTE — Assessment & Plan Note (Signed)
GI evaluated, EGD 03/25/22 wnl, working with ST, thicken liquids.

## 2022-10-18 DIAGNOSIS — R29898 Other symptoms and signs involving the musculoskeletal system: Secondary | ICD-10-CM | POA: Diagnosis not present

## 2022-10-18 DIAGNOSIS — R41841 Cognitive communication deficit: Secondary | ICD-10-CM | POA: Diagnosis not present

## 2022-10-18 DIAGNOSIS — M6281 Muscle weakness (generalized): Secondary | ICD-10-CM | POA: Diagnosis not present

## 2022-10-22 DIAGNOSIS — M6281 Muscle weakness (generalized): Secondary | ICD-10-CM | POA: Diagnosis not present

## 2022-10-22 DIAGNOSIS — R29898 Other symptoms and signs involving the musculoskeletal system: Secondary | ICD-10-CM | POA: Diagnosis not present

## 2022-10-22 DIAGNOSIS — R41841 Cognitive communication deficit: Secondary | ICD-10-CM | POA: Diagnosis not present

## 2022-10-25 DIAGNOSIS — R41841 Cognitive communication deficit: Secondary | ICD-10-CM | POA: Diagnosis not present

## 2022-10-25 DIAGNOSIS — M6281 Muscle weakness (generalized): Secondary | ICD-10-CM | POA: Diagnosis not present

## 2022-10-25 DIAGNOSIS — R29898 Other symptoms and signs involving the musculoskeletal system: Secondary | ICD-10-CM | POA: Diagnosis not present

## 2022-10-28 ENCOUNTER — Other Ambulatory Visit: Payer: Self-pay | Admitting: Nurse Practitioner

## 2022-10-28 DIAGNOSIS — F304 Manic episode in full remission: Secondary | ICD-10-CM

## 2022-10-28 NOTE — Telephone Encounter (Signed)
High Risk Warning Populated when attempting to refill, I will send to Provider for further review 

## 2022-10-28 NOTE — Telephone Encounter (Signed)
See providers response below:  Venita Sheffield, MD  to [facility]      10/28/22 12:30 PM Refilled losartan,  pt needs to have lithium levels checked as losartan can interfere with lithium Order for lithium placed.

## 2022-10-28 NOTE — Telephone Encounter (Signed)
Lithium level was checked 3 months ago, how often should it be checked?

## 2022-10-29 DIAGNOSIS — M6281 Muscle weakness (generalized): Secondary | ICD-10-CM | POA: Diagnosis not present

## 2022-10-29 DIAGNOSIS — R29898 Other symptoms and signs involving the musculoskeletal system: Secondary | ICD-10-CM | POA: Diagnosis not present

## 2022-10-29 DIAGNOSIS — R41841 Cognitive communication deficit: Secondary | ICD-10-CM | POA: Diagnosis not present

## 2022-10-30 ENCOUNTER — Other Ambulatory Visit: Payer: Self-pay | Admitting: Nurse Practitioner

## 2022-10-30 DIAGNOSIS — F304 Manic episode in full remission: Secondary | ICD-10-CM

## 2022-10-31 NOTE — Telephone Encounter (Signed)
  Venita Sheffield, MD  to [facility]      10/31/22 11:46 AM  Refilled lithium, lithium levels low 3 months ago, needs to come for blood work in 2-3 weeks    Mychart message sent to patient

## 2022-11-04 DIAGNOSIS — R41841 Cognitive communication deficit: Secondary | ICD-10-CM | POA: Diagnosis not present

## 2022-11-04 DIAGNOSIS — M6281 Muscle weakness (generalized): Secondary | ICD-10-CM | POA: Diagnosis not present

## 2022-11-04 DIAGNOSIS — R29898 Other symptoms and signs involving the musculoskeletal system: Secondary | ICD-10-CM | POA: Diagnosis not present

## 2022-11-05 DIAGNOSIS — M47812 Spondylosis without myelopathy or radiculopathy, cervical region: Secondary | ICD-10-CM | POA: Diagnosis not present

## 2022-11-05 DIAGNOSIS — R519 Headache, unspecified: Secondary | ICD-10-CM | POA: Diagnosis not present

## 2022-11-08 DIAGNOSIS — G245 Blepharospasm: Secondary | ICD-10-CM | POA: Diagnosis not present

## 2022-11-08 DIAGNOSIS — H04123 Dry eye syndrome of bilateral lacrimal glands: Secondary | ICD-10-CM | POA: Diagnosis not present

## 2022-11-12 DIAGNOSIS — M6281 Muscle weakness (generalized): Secondary | ICD-10-CM | POA: Diagnosis not present

## 2022-11-12 DIAGNOSIS — R29898 Other symptoms and signs involving the musculoskeletal system: Secondary | ICD-10-CM | POA: Diagnosis not present

## 2022-11-12 DIAGNOSIS — R41841 Cognitive communication deficit: Secondary | ICD-10-CM | POA: Diagnosis not present

## 2022-11-14 DIAGNOSIS — R41841 Cognitive communication deficit: Secondary | ICD-10-CM | POA: Diagnosis not present

## 2022-11-14 DIAGNOSIS — R29898 Other symptoms and signs involving the musculoskeletal system: Secondary | ICD-10-CM | POA: Diagnosis not present

## 2022-11-14 DIAGNOSIS — M6281 Muscle weakness (generalized): Secondary | ICD-10-CM | POA: Diagnosis not present

## 2022-11-20 DIAGNOSIS — M6281 Muscle weakness (generalized): Secondary | ICD-10-CM | POA: Diagnosis not present

## 2022-11-20 DIAGNOSIS — R29898 Other symptoms and signs involving the musculoskeletal system: Secondary | ICD-10-CM | POA: Diagnosis not present

## 2022-11-20 DIAGNOSIS — R41841 Cognitive communication deficit: Secondary | ICD-10-CM | POA: Diagnosis not present

## 2022-11-21 DIAGNOSIS — R41841 Cognitive communication deficit: Secondary | ICD-10-CM | POA: Diagnosis not present

## 2022-11-21 DIAGNOSIS — R29898 Other symptoms and signs involving the musculoskeletal system: Secondary | ICD-10-CM | POA: Diagnosis not present

## 2022-11-21 DIAGNOSIS — M6281 Muscle weakness (generalized): Secondary | ICD-10-CM | POA: Diagnosis not present

## 2022-11-22 DIAGNOSIS — R41841 Cognitive communication deficit: Secondary | ICD-10-CM | POA: Diagnosis not present

## 2022-11-22 DIAGNOSIS — R29898 Other symptoms and signs involving the musculoskeletal system: Secondary | ICD-10-CM | POA: Diagnosis not present

## 2022-11-22 DIAGNOSIS — M6281 Muscle weakness (generalized): Secondary | ICD-10-CM | POA: Diagnosis not present

## 2022-11-25 DIAGNOSIS — H00012 Hordeolum externum right lower eyelid: Secondary | ICD-10-CM | POA: Diagnosis not present

## 2022-11-26 DIAGNOSIS — M6281 Muscle weakness (generalized): Secondary | ICD-10-CM | POA: Diagnosis not present

## 2022-11-26 DIAGNOSIS — R41841 Cognitive communication deficit: Secondary | ICD-10-CM | POA: Diagnosis not present

## 2022-11-26 DIAGNOSIS — R29898 Other symptoms and signs involving the musculoskeletal system: Secondary | ICD-10-CM | POA: Diagnosis not present

## 2022-11-28 DIAGNOSIS — R41841 Cognitive communication deficit: Secondary | ICD-10-CM | POA: Diagnosis not present

## 2022-11-28 DIAGNOSIS — M6281 Muscle weakness (generalized): Secondary | ICD-10-CM | POA: Diagnosis not present

## 2022-11-28 DIAGNOSIS — R29898 Other symptoms and signs involving the musculoskeletal system: Secondary | ICD-10-CM | POA: Diagnosis not present

## 2022-11-29 DIAGNOSIS — M6281 Muscle weakness (generalized): Secondary | ICD-10-CM | POA: Diagnosis not present

## 2022-11-29 DIAGNOSIS — R41841 Cognitive communication deficit: Secondary | ICD-10-CM | POA: Diagnosis not present

## 2022-11-29 DIAGNOSIS — R29898 Other symptoms and signs involving the musculoskeletal system: Secondary | ICD-10-CM | POA: Diagnosis not present

## 2022-12-03 DIAGNOSIS — G243 Spasmodic torticollis: Secondary | ICD-10-CM | POA: Diagnosis not present

## 2022-12-03 DIAGNOSIS — R519 Headache, unspecified: Secondary | ICD-10-CM | POA: Diagnosis not present

## 2022-12-03 DIAGNOSIS — M6281 Muscle weakness (generalized): Secondary | ICD-10-CM | POA: Diagnosis not present

## 2022-12-03 DIAGNOSIS — R41841 Cognitive communication deficit: Secondary | ICD-10-CM | POA: Diagnosis not present

## 2022-12-03 DIAGNOSIS — M47812 Spondylosis without myelopathy or radiculopathy, cervical region: Secondary | ICD-10-CM | POA: Diagnosis not present

## 2022-12-03 DIAGNOSIS — R29898 Other symptoms and signs involving the musculoskeletal system: Secondary | ICD-10-CM | POA: Diagnosis not present

## 2022-12-05 DIAGNOSIS — M6281 Muscle weakness (generalized): Secondary | ICD-10-CM | POA: Diagnosis not present

## 2022-12-05 DIAGNOSIS — R41841 Cognitive communication deficit: Secondary | ICD-10-CM | POA: Diagnosis not present

## 2022-12-05 DIAGNOSIS — R29898 Other symptoms and signs involving the musculoskeletal system: Secondary | ICD-10-CM | POA: Diagnosis not present

## 2022-12-06 DIAGNOSIS — R41841 Cognitive communication deficit: Secondary | ICD-10-CM | POA: Diagnosis not present

## 2022-12-06 DIAGNOSIS — M6281 Muscle weakness (generalized): Secondary | ICD-10-CM | POA: Diagnosis not present

## 2022-12-06 DIAGNOSIS — R29898 Other symptoms and signs involving the musculoskeletal system: Secondary | ICD-10-CM | POA: Diagnosis not present

## 2022-12-09 DIAGNOSIS — H0012 Chalazion right lower eyelid: Secondary | ICD-10-CM | POA: Diagnosis not present

## 2022-12-09 DIAGNOSIS — H0100A Unspecified blepharitis right eye, upper and lower eyelids: Secondary | ICD-10-CM | POA: Diagnosis not present

## 2022-12-09 DIAGNOSIS — H0100B Unspecified blepharitis left eye, upper and lower eyelids: Secondary | ICD-10-CM | POA: Diagnosis not present

## 2022-12-09 DIAGNOSIS — H401134 Primary open-angle glaucoma, bilateral, indeterminate stage: Secondary | ICD-10-CM | POA: Diagnosis not present

## 2022-12-09 DIAGNOSIS — H04123 Dry eye syndrome of bilateral lacrimal glands: Secondary | ICD-10-CM | POA: Diagnosis not present

## 2022-12-10 DIAGNOSIS — R41841 Cognitive communication deficit: Secondary | ICD-10-CM | POA: Diagnosis not present

## 2022-12-10 DIAGNOSIS — M6281 Muscle weakness (generalized): Secondary | ICD-10-CM | POA: Diagnosis not present

## 2022-12-10 DIAGNOSIS — R29898 Other symptoms and signs involving the musculoskeletal system: Secondary | ICD-10-CM | POA: Diagnosis not present

## 2022-12-11 DIAGNOSIS — M6281 Muscle weakness (generalized): Secondary | ICD-10-CM | POA: Diagnosis not present

## 2022-12-11 DIAGNOSIS — R29898 Other symptoms and signs involving the musculoskeletal system: Secondary | ICD-10-CM | POA: Diagnosis not present

## 2022-12-11 DIAGNOSIS — R41841 Cognitive communication deficit: Secondary | ICD-10-CM | POA: Diagnosis not present

## 2022-12-17 DIAGNOSIS — M6281 Muscle weakness (generalized): Secondary | ICD-10-CM | POA: Diagnosis not present

## 2022-12-17 DIAGNOSIS — R41841 Cognitive communication deficit: Secondary | ICD-10-CM | POA: Diagnosis not present

## 2022-12-17 DIAGNOSIS — R29898 Other symptoms and signs involving the musculoskeletal system: Secondary | ICD-10-CM | POA: Diagnosis not present

## 2022-12-19 ENCOUNTER — Other Ambulatory Visit: Payer: Self-pay | Admitting: Nurse Practitioner

## 2022-12-19 DIAGNOSIS — R41841 Cognitive communication deficit: Secondary | ICD-10-CM | POA: Diagnosis not present

## 2022-12-19 DIAGNOSIS — R29898 Other symptoms and signs involving the musculoskeletal system: Secondary | ICD-10-CM | POA: Diagnosis not present

## 2022-12-19 DIAGNOSIS — M6281 Muscle weakness (generalized): Secondary | ICD-10-CM | POA: Diagnosis not present

## 2022-12-19 DIAGNOSIS — F304 Manic episode in full remission: Secondary | ICD-10-CM

## 2022-12-19 NOTE — Telephone Encounter (Signed)
Patient is requesting a refill of the following medications: Requested Prescriptions   Pending Prescriptions Disp Refills   ALPRAZolam (XANAX) 0.5 MG tablet [Pharmacy Med Name: alprazolam 0.5 mg tablet] 60 tablet 2    Sig: TAKE ONE TABLET BY MOUTH TWICE DAILY AS NEEDED FOR ANXIETY    Date of last refill approval: 09/17/22  Refill amount: 30/2   Treatment agreement date: 10/2022

## 2022-12-20 DIAGNOSIS — R29898 Other symptoms and signs involving the musculoskeletal system: Secondary | ICD-10-CM | POA: Diagnosis not present

## 2022-12-20 DIAGNOSIS — R41841 Cognitive communication deficit: Secondary | ICD-10-CM | POA: Diagnosis not present

## 2022-12-20 DIAGNOSIS — M6281 Muscle weakness (generalized): Secondary | ICD-10-CM | POA: Diagnosis not present

## 2022-12-24 DIAGNOSIS — G245 Blepharospasm: Secondary | ICD-10-CM | POA: Diagnosis not present

## 2022-12-26 DIAGNOSIS — R29898 Other symptoms and signs involving the musculoskeletal system: Secondary | ICD-10-CM | POA: Diagnosis not present

## 2022-12-26 DIAGNOSIS — M6281 Muscle weakness (generalized): Secondary | ICD-10-CM | POA: Diagnosis not present

## 2022-12-26 DIAGNOSIS — R41841 Cognitive communication deficit: Secondary | ICD-10-CM | POA: Diagnosis not present

## 2022-12-27 ENCOUNTER — Other Ambulatory Visit: Payer: Self-pay | Admitting: Family Medicine

## 2022-12-27 DIAGNOSIS — R41841 Cognitive communication deficit: Secondary | ICD-10-CM | POA: Diagnosis not present

## 2022-12-27 DIAGNOSIS — R29898 Other symptoms and signs involving the musculoskeletal system: Secondary | ICD-10-CM | POA: Diagnosis not present

## 2022-12-27 DIAGNOSIS — M6281 Muscle weakness (generalized): Secondary | ICD-10-CM | POA: Diagnosis not present

## 2022-12-31 DIAGNOSIS — R29898 Other symptoms and signs involving the musculoskeletal system: Secondary | ICD-10-CM | POA: Diagnosis not present

## 2022-12-31 DIAGNOSIS — M6281 Muscle weakness (generalized): Secondary | ICD-10-CM | POA: Diagnosis not present

## 2022-12-31 DIAGNOSIS — R41841 Cognitive communication deficit: Secondary | ICD-10-CM | POA: Diagnosis not present

## 2023-01-03 DIAGNOSIS — R41841 Cognitive communication deficit: Secondary | ICD-10-CM | POA: Diagnosis not present

## 2023-01-03 DIAGNOSIS — R29898 Other symptoms and signs involving the musculoskeletal system: Secondary | ICD-10-CM | POA: Diagnosis not present

## 2023-01-03 DIAGNOSIS — M6281 Muscle weakness (generalized): Secondary | ICD-10-CM | POA: Diagnosis not present

## 2023-01-06 DIAGNOSIS — R29898 Other symptoms and signs involving the musculoskeletal system: Secondary | ICD-10-CM | POA: Diagnosis not present

## 2023-01-06 DIAGNOSIS — R41841 Cognitive communication deficit: Secondary | ICD-10-CM | POA: Diagnosis not present

## 2023-01-06 DIAGNOSIS — M6281 Muscle weakness (generalized): Secondary | ICD-10-CM | POA: Diagnosis not present

## 2023-01-13 ENCOUNTER — Other Ambulatory Visit: Payer: Self-pay | Admitting: Nurse Practitioner

## 2023-01-13 DIAGNOSIS — E785 Hyperlipidemia, unspecified: Secondary | ICD-10-CM

## 2023-01-13 NOTE — Telephone Encounter (Signed)
Patient has request refill on medication that has warnings. Medication pend and sent to PCP Venita Sheffield, MD

## 2023-01-14 DIAGNOSIS — M6281 Muscle weakness (generalized): Secondary | ICD-10-CM | POA: Diagnosis not present

## 2023-01-14 DIAGNOSIS — R41841 Cognitive communication deficit: Secondary | ICD-10-CM | POA: Diagnosis not present

## 2023-01-14 DIAGNOSIS — R29898 Other symptoms and signs involving the musculoskeletal system: Secondary | ICD-10-CM | POA: Diagnosis not present

## 2023-01-16 DIAGNOSIS — R41841 Cognitive communication deficit: Secondary | ICD-10-CM | POA: Diagnosis not present

## 2023-01-16 DIAGNOSIS — R29898 Other symptoms and signs involving the musculoskeletal system: Secondary | ICD-10-CM | POA: Diagnosis not present

## 2023-01-16 DIAGNOSIS — M6281 Muscle weakness (generalized): Secondary | ICD-10-CM | POA: Diagnosis not present

## 2023-01-17 DIAGNOSIS — M6281 Muscle weakness (generalized): Secondary | ICD-10-CM | POA: Diagnosis not present

## 2023-01-17 DIAGNOSIS — R29898 Other symptoms and signs involving the musculoskeletal system: Secondary | ICD-10-CM | POA: Diagnosis not present

## 2023-01-17 DIAGNOSIS — R41841 Cognitive communication deficit: Secondary | ICD-10-CM | POA: Diagnosis not present

## 2023-01-20 ENCOUNTER — Other Ambulatory Visit: Payer: Self-pay | Admitting: Nurse Practitioner

## 2023-01-20 DIAGNOSIS — F304 Manic episode in full remission: Secondary | ICD-10-CM

## 2023-01-21 DIAGNOSIS — R41841 Cognitive communication deficit: Secondary | ICD-10-CM | POA: Diagnosis not present

## 2023-01-21 DIAGNOSIS — M6281 Muscle weakness (generalized): Secondary | ICD-10-CM | POA: Diagnosis not present

## 2023-01-21 DIAGNOSIS — R29898 Other symptoms and signs involving the musculoskeletal system: Secondary | ICD-10-CM | POA: Diagnosis not present

## 2023-01-23 ENCOUNTER — Non-Acute Institutional Stay: Payer: Medicare PPO | Admitting: Nurse Practitioner

## 2023-01-23 ENCOUNTER — Encounter: Payer: Self-pay | Admitting: Nurse Practitioner

## 2023-01-23 VITALS — BP 120/68 | HR 60 | Temp 97.9°F | Resp 20 | Ht 61.0 in | Wt 123.8 lb

## 2023-01-23 DIAGNOSIS — R41841 Cognitive communication deficit: Secondary | ICD-10-CM | POA: Diagnosis not present

## 2023-01-23 DIAGNOSIS — M6281 Muscle weakness (generalized): Secondary | ICD-10-CM | POA: Diagnosis not present

## 2023-01-23 DIAGNOSIS — I10 Essential (primary) hypertension: Secondary | ICD-10-CM

## 2023-01-23 DIAGNOSIS — K219 Gastro-esophageal reflux disease without esophagitis: Secondary | ICD-10-CM

## 2023-01-23 DIAGNOSIS — G243 Spasmodic torticollis: Secondary | ICD-10-CM

## 2023-01-23 DIAGNOSIS — M150 Primary generalized (osteo)arthritis: Secondary | ICD-10-CM

## 2023-01-23 DIAGNOSIS — R4189 Other symptoms and signs involving cognitive functions and awareness: Secondary | ICD-10-CM

## 2023-01-23 DIAGNOSIS — E785 Hyperlipidemia, unspecified: Secondary | ICD-10-CM | POA: Diagnosis not present

## 2023-01-23 DIAGNOSIS — R001 Bradycardia, unspecified: Secondary | ICD-10-CM | POA: Diagnosis not present

## 2023-01-23 DIAGNOSIS — F304 Manic episode in full remission: Secondary | ICD-10-CM | POA: Diagnosis not present

## 2023-01-23 DIAGNOSIS — R29898 Other symptoms and signs involving the musculoskeletal system: Secondary | ICD-10-CM | POA: Diagnosis not present

## 2023-01-23 DIAGNOSIS — I83892 Varicose veins of left lower extremities with other complications: Secondary | ICD-10-CM

## 2023-01-23 DIAGNOSIS — K5901 Slow transit constipation: Secondary | ICD-10-CM

## 2023-01-23 NOTE — Assessment & Plan Note (Signed)
12/2019 MRI brain showed chronic microvascular ischemic changes, no acute disease with mild cortical atrophy, TSH 2.08, Vit B12 466 07/30/22

## 2023-01-23 NOTE — Assessment & Plan Note (Signed)
 LDL 137 07/30/22, on diet, tolerated Pravastatin

## 2023-01-23 NOTE — Assessment & Plan Note (Signed)
blood pressure is controlled on Losartan 

## 2023-01-23 NOTE — Assessment & Plan Note (Signed)
improved from 40s to 50s. Hx of normalized HR after Tylenol PM dc'd, GDR of Lithium helped too. 

## 2023-01-23 NOTE — Progress Notes (Signed)
 Location:   Clinic [facility]   Place of Service:  Clinic (12) Provider: Larwance Hark NP  Code Status: DNR Goals of Care:     01/23/2023    3:15 PM  Advanced Directives  Does Patient Have a Medical Advance Directive? Yes  Type of Advance Directive Healthcare Power of Attorney  Does patient want to make changes to medical advance directive? No - Patient declined  Copy of Healthcare Power of Attorney in Chart? Yes - validated most recent copy scanned in chart (See row information)     Chief Complaint  Patient presents with   Medical Management of Chronic Issues    3 month follow up and discuss medicare annual wellness visit,covid ,flu and tdap vaccines.    HPI: Patient is a 83 y.o. female seen today for medical management of chronic diseases.      Dry mouth syndrome, resolved after dentist recommend mouth wash             Persisted running nose, facial pressure, sore throat, underwent ENT evaluation              Esophageal candidiasis, recurrent thrush identified on swallow study, FEES showed diffused white secretion coating areas of white plaques throughout pharynx. The patient c/o sore in buccal sides of tongue and in her throat when swallows. Treated with Diflucan  in the past, underwent GI evaluation, EGD 03/25/22 normal findings. Last treated with Diflucan  300mg  x1 04/10/22, then followed 200mg  qd x1 wk, may repeat x1.                Edema, LLE negative DVT venous US , L>R. Medial thickened swelling area about her palm sized, mild warmth, redness, and indurated area for a few month. Varicose veins LLE not new.  Tinnitus, underwent eval of  ENT, suggested music background.              Hx of GIST of small intestine s/p resection. MiraLax , Psyllium              Dysphagia, GI evaluated, EGD 03/25/22 wnl, working with ST, thicken liquids.              Constipation, Metamucil, MiraLax ,  effective.              GERD, stable, on Omeprazole , Hgb 12.2 07/30/22             HTN, blood pressure is  controlled on Losartan              Bipolar disorder, stable, on Lithium , GDR due to bradycardia, takes Alprazolam  too, TSH 2.08 07/30/22. Declined Dr Cleotilde started low dose of Depakote . Lithium  level 0.4 07/30/22             Bradycardia, improved from 40s to 50s. Hx of normalized HR after Tylenol  PM dc'd, GDR of Lithium  helped too.              Hypercalcemia, f/u endocrinology, Ca 10.7 04/09/21,  PTH 79 01/12/20. GDR of Lithium  may help             OA, takes  Tylenol , s/p R knee arthroplasty.             Cervical degenerative changes/dystonia, better, CT/MRI 12/2019 ruled out acute process, aches in neck R>L travels to the back of head, comes and goes, not disabling, f/u Neurosurgery. Failed Cymbalta , Gabapentin , Robaxin , Ultracet . s/p Botulinum Toxin inj by Neurology. ESR 9, CRP 2.7 10/09/20(ESR 6 07/30/22). The patient stated Alprazolam  and Tylenol  are kind  of effective. Improved on Topamax , but associated with weight loss.  No Parkinson's disease, took her off Sinemet  per Neurology. Currently taking inj               Cognitive impairment: 12/2019 MRI brain showed chronic microvascular ischemic changes, no acute disease with mild cortical atrophy, TSH 2.08, Vit B12 466 07/30/22             Hyperlipidemia, LDL 137 07/30/22, on diet, tolerated Pravastatin        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, possible    Tremors possible parkinsons   Manic  disorder, single episode, in full remission (HCC) 12/01/2009   Sixth nerve palsy     Past Surgical History:  Procedure Laterality Date   BILATERAL SALPINGOOPHORECTOMY  09/22/2007   CATARACT EXTRACTION Bilateral    ESOPHAGOGASTRODUODENOSCOPY (EGD) WITH PROPOFOL  N/A 03/25/2022   Procedure: ESOPHAGOGASTRODUODENOSCOPY (EGD) WITH PROPOFOL ;  Surgeon: Legrand Victory LITTIE DOUGLAS, MD;  Location: [facility];  Service: Gastroenterology;  Laterality: N/A;   Gastrointestinal Stroma Tumor,  Other  1960   GIST Surgery   ILEOCECETOMY  09/22/2007   OVARIAN CYST REMOVAL Right 1967   SMALL INTESTINE SURGERY  09/22/2007   TOTAL KNEE ARTHROPLASTY Right 10/05/2019   Procedure: RIGHT TOTAL KNEE ARTHROPLASTY;  Surgeon: Addie Cordella Hamilton, MD;  Location: [facility];  Service: Orthopedics;  Laterality: Right;   TOTAL VAGINAL HYSTERECTOMY  1986   Fibroids    Allergies  Allergen Reactions   Lisinopril Cough   Penicillins Itching    50 years ago   Adhesive [Tape] Itching   Atorvastatin Itching   Dilaudid  [Hydromorphone  Hcl] Itching   Hydromorphone  Itching    Allergies as of 01/23/2023       Reactions   Lisinopril Cough   Penicillins Itching   50 years ago   Adhesive [tape] Itching   Atorvastatin Itching   Dilaudid  [hydromorphone  Hcl] Itching   Hydromorphone  Itching        Medication List        Accurate as of January 23, 2023 11:59 PM. If you have any questions, ask your nurse or doctor.          ALPRAZolam  0.5 MG tablet Commonly known as: XANAX  Take 1 tablet (0.5 mg total) by mouth 2 (two) times daily as needed. for anxiety   aspirin  81 MG chewable tablet Chew 81 mg by mouth daily.   Biotene Dry Mouth Lozg Use as directed 1 tablet in the mouth or throat 4 (four) times daily as needed.   bismuth subsalicylate 262 MG/15ML suspension Commonly known as: PEPTO BISMOL Take 30 mLs by mouth every 6 (six) hours as needed for diarrhea or loose stools.   clindamycin  150 MG capsule Commonly known  as: CLEOCIN  Take 600mg  1 hour prior to dental procedure   DULoxetine  20 MG capsule Commonly known as: Cymbalta  Take 1 capsule (20 mg  total) by mouth daily.   latanoprost  0.005 % ophthalmic solution Commonly known as: XALATAN  Place 1 drop into both eyes at bedtime.   lithium  carbonate 150 MG capsule TAKE 1 CAPSULE TWICE DAILY WITH MEALS.   losartan  50 MG tablet Commonly known as: COZAAR  TAKE ONE TABLET BY MOUTH ONCE DAILY   MAGNESIUM  PO Take 400 mg by mouth daily.   mirabegron  ER 25 MG Tb24 tablet Commonly known as: Myrbetriq  Take 1 tablet (25 mg total) by mouth daily.   multivitamin tablet Take 1 tablet by mouth daily.   omeprazole  20 MG capsule Commonly known as: PRILOSEC Take 1 capsule (20 mg total) by mouth daily.   polyethylene glycol 17 g packet Commonly known as: MIRALAX  / GLYCOLAX  Take 17 g by mouth daily.   pravastatin  10 MG tablet Commonly known as: PRAVACHOL  Take 1 tablet (10 mg total) by mouth daily.   Restasis  0.05 % ophthalmic emulsion Generic drug: cycloSPORINE  Place 1 drop into both eyes 2 (two) times daily.   topiramate  25 MG tablet Commonly known as: TOPAMAX  TAKE ONE TABLET BY MOUTH TWICE DAILY IN THE MORNING AND IN THE EVENING   triamcinolone  ointment 0.5 % Commonly known as: KENALOG  Apply 1 Application topically 2 (two) times daily as needed (place on leg).   triamcinolone  cream 0.5 % Commonly known as: KENALOG  APPLY TO THE AFFECTED AREA(S) TOPICALLY TWICE DAILY        Review of Systems:  Review of Systems  Constitutional:  Negative for appetite change, fatigue and fever.  HENT:  Positive for hearing loss and rhinorrhea. Negative for congestion.   Eyes:  Negative for visual disturbance.       Glaucoma, diplopia R+L lateral peripheral visual fields only.   Respiratory:  Negative for shortness of breath.        Occasionally DOE, hacking cough  Cardiovascular:  Positive for leg swelling.  Gastrointestinal:  Negative for abdominal  pain and constipation.  Genitourinary:  Positive for frequency. Negative for dysuria and urgency.       Couple of times at night, no difficulty of returning asleep.   Musculoskeletal:  Positive for arthralgias and gait problem.       Walker.   Skin:  Negative for color change.  Neurological:  Negative for tremors, speech difficulty, light-headedness and headaches.       Comes and goes nature of the neck/occipital pain. Stiff neck. Pain the patent right knee, s/p TKR. Tremor is better since Sinemet .   Psychiatric/Behavioral:  Positive for sleep disturbance. Negative for confusion. The patient is nervous/anxious.        Better mood, feels not as anxious or depressive mood as prior.     Health Maintenance  Topic Date Due   DTaP/Tdap/Td (2 - Td or Tdap) 08/19/2021   COVID-19 Vaccine (6 - 2024-25 season) 01/08/2023   Medicare Annual Wellness (AWV)  02/08/2023   Pneumonia Vaccine 87+ Years old  Completed   INFLUENZA VACCINE  Completed   DEXA SCAN  Completed   Zoster Vaccines- Shingrix  Completed   HPV VACCINES  Aged Out    Physical Exam: Vitals:   01/23/23 1526  BP: 120/68  Pulse: 60  Resp: 20  Temp: 97.9 F (36.6 C)  SpO2: 96%  Weight: 123 lb 12.8 oz (56.2 kg)  Height: 5' 1 (1.549 m)   Body mass index is 23.39 kg/m. Physical Exam Vitals and nursing note reviewed.  Constitutional:      Appearance: Normal appearance.  HENT:  Head: Normocephalic and atraumatic.     Nose: Nose normal.     Mouth/Throat:     Mouth: Mucous membranes are moist.  Eyes:     Extraocular Movements: Extraocular movements intact.     Conjunctiva/sclera: Conjunctivae normal.     Pupils: Pupils are equal, round, and reactive to light.  Cardiovascular:     Rate and Rhythm: Normal rate and regular rhythm.     Heart sounds: Murmur heard.     Comments: DP pulses present R+L. HR in 50s Pulmonary:     Effort: Pulmonary effort is normal.     Breath sounds: No rales.  Abdominal:     General: Bowel  sounds are normal.     Palpations: Abdomen is soft.     Tenderness: There is no abdominal tenderness.  Musculoskeletal:     Cervical back: Normal range of motion and neck supple.     Right lower leg: Edema present.     Left lower leg: Edema present.     Comments: Chronic R knee pain is improved. S/p TKR right. Felt lateral right thigh muscle is tight.  Stiff neck, comes and goes neck/occipital pain. Trace edema BLE. Arthritic joint change fingers.   Skin:    General: Skin is warm and dry.     Comments: Improved the medial lower left leg a palm sized thickened, slightly reddened area, mild discomfort when palpated. DP present left foot. Scattered varicose veins left leg 05/09/22 medical left MTJ callous, plantar aspect 3rd toe callous, the left 2nd toe pain after injury, but no bruise, deformity, swelling, redness, able to PROM w/o pain.    Neurological:     General: No focal deficit present.     Mental Status: She is alert and oriented to person, place, and time. Mental status is at baseline.     Motor: No weakness.     Coordination: Coordination abnormal.     Gait: Gait abnormal.     Comments: Resting tremor in fingers, near resolution.   Psychiatric:        Mood and Affect: Mood normal.        Behavior: Behavior normal.        Thought Content: Thought content normal.     Labs reviewed: Basic Metabolic Panel: Recent Labs    07/30/22 0708  TSH 2.08   Liver Function Tests: No results for input(s): AST, ALT, ALKPHOS, BILITOT, PROT, ALBUMIN  in the last 8760 hours. No results for input(s): LIPASE, AMYLASE in the last 8760 hours. No results for input(s): AMMONIA in the last 8760 hours. CBC: Recent Labs    07/30/22 0000 07/30/22 0708  WBC 7.9 7.9  NEUTROABS 6,028.00 6,028  HGB 12.2 12.2  HCT 36 36.2  MCV  --  98.9  PLT 292 292   Lipid Panel: Recent Labs    07/30/22 0708  CHOL 241*  HDL 74  LDLCALC 137*  TRIG 166*  CHOLHDL 3.3   No results found  for: HGBA1C  Procedures since last visit: No results found.  Assessment/Plan  Cervical dystonia Cervical degenerative changes/dystonia, better, CT/MRI 12/2019 ruled out acute process, aches in neck R>L travels to the back of head, comes and goes, not disabling, f/u Neurosurgery. Failed Cymbalta , Gabapentin , Robaxin , Ultracet . s/p Botulinum Toxin inj by Neurology. ESR 9, CRP 2.7 10/09/20(ESR 6 07/30/22). The patient stated Alprazolam  and Tylenol  are kind of effective. Improved on Topamax , but associated with weight loss.  No Parkinson's disease, took her off Sinemet  per Neurology. Currently taking inj  q4hrs.   Cognitive impairment 12/2019 MRI brain showed chronic microvascular ischemic changes, no acute disease with mild cortical atrophy, TSH 2.08, Vit B12 466 07/30/22  HLD (hyperlipidemia) LDL 137 07/30/22, on diet, tolerated Pravastatin   Arthritis, degenerative  takes  Tylenol , s/p R knee arthroplasty.   Hypercalcemia f/u endocrinology, Ca 10.7 04/09/21,  PTH 79 01/12/20. GDR of Lithium  may help  Bradycardia  improved from 40s to 50s. Hx of normalized HR after Tylenol  PM dc'd, GDR of Lithium  helped too.   Bipolar I disorder, single manic episode, in full remission (HCC) stable, on Lithium , GDR due to bradycardia, takes Alprazolam  too, TSH 2.08 07/30/22. Declined Dr Cleotilde started low dose of Depakote . Lithium  level 0.4 07/30/22  Essential (primary) hypertension blood pressure is controlled on Losartan   GERD (gastroesophageal reflux disease) stable, on Omeprazole , Hgb 12.2 07/30/22  Slow transit constipation Stable, continue Metamucil, MiraLax ,  effective.   Varicose veins of left leg with edema LLE negative DVT venous US , L>R. Medial thickened swelling area about her palm sized, mild warmth, redness, and indurated area for a few month. Varicose veins LLE not new.    Labs/tests ordered:  CBC/diff, CMP/eGFR, TSH, lipids 07/29/23  Next appt:  6-7 months after labs

## 2023-01-23 NOTE — Assessment & Plan Note (Signed)
stable, on Omeprazole, Hgb 12.2 07/30/22

## 2023-01-23 NOTE — Assessment & Plan Note (Signed)
Stable, continue Metamucil, MiraLax,  effective.

## 2023-01-23 NOTE — Assessment & Plan Note (Signed)
f/u endocrinology, Ca 10.7 04/09/21,  PTH 79 01/12/20. GDR of Lithium may help 

## 2023-01-23 NOTE — Assessment & Plan Note (Signed)
stable, on Lithium, GDR due to bradycardia, takes Alprazolam too, TSH 2.08 07/30/22. Declined Dr Hyacinth Meeker started low dose of Depakote. Lithium level 0.4 07/30/22

## 2023-01-23 NOTE — Assessment & Plan Note (Signed)
LLE negative DVT venous US, L>R. Medial thickened swelling area about her palm sized, mild warmth, redness, and indurated area for a few month. Varicose veins LLE not new.  

## 2023-01-23 NOTE — Assessment & Plan Note (Signed)
 Cervical degenerative changes/dystonia, better, CT/MRI 12/2019 ruled out acute process, aches in neck R>L travels to the back of head, comes and goes, not disabling, f/u Neurosurgery. Failed Cymbalta , Gabapentin , Robaxin , Ultracet . s/p Botulinum Toxin inj by Neurology. ESR 9, CRP 2.7 10/09/20(ESR 6 07/30/22). The patient stated Alprazolam  and Tylenol  are kind of effective. Improved on Topamax , but associated with weight loss.  No Parkinson's disease, took her off Sinemet  per Neurology. Currently taking inj q4hrs.

## 2023-01-23 NOTE — Assessment & Plan Note (Signed)
takes  Tylenol, s/p R knee arthroplasty.  

## 2023-01-24 DIAGNOSIS — M6281 Muscle weakness (generalized): Secondary | ICD-10-CM | POA: Diagnosis not present

## 2023-01-24 DIAGNOSIS — R41841 Cognitive communication deficit: Secondary | ICD-10-CM | POA: Diagnosis not present

## 2023-01-24 DIAGNOSIS — R29898 Other symptoms and signs involving the musculoskeletal system: Secondary | ICD-10-CM | POA: Diagnosis not present

## 2023-01-27 ENCOUNTER — Other Ambulatory Visit: Payer: Self-pay | Admitting: *Deleted

## 2023-01-27 ENCOUNTER — Encounter: Payer: Self-pay | Admitting: Nurse Practitioner

## 2023-01-27 DIAGNOSIS — F304 Manic episode in full remission: Secondary | ICD-10-CM

## 2023-01-27 NOTE — Telephone Encounter (Signed)
Pharmacy requested refill.  Pended Rx and sent to Dr. Jacquenette Shone for approval due to HIGH ALERT Warning.

## 2023-01-28 DIAGNOSIS — M6281 Muscle weakness (generalized): Secondary | ICD-10-CM | POA: Diagnosis not present

## 2023-01-28 DIAGNOSIS — R41841 Cognitive communication deficit: Secondary | ICD-10-CM | POA: Diagnosis not present

## 2023-01-28 DIAGNOSIS — R29898 Other symptoms and signs involving the musculoskeletal system: Secondary | ICD-10-CM | POA: Diagnosis not present

## 2023-01-28 MED ORDER — LITHIUM CARBONATE 150 MG PO CAPS: ORAL_CAPSULE | ORAL | 0 refills | Status: DC

## 2023-01-29 DIAGNOSIS — M6281 Muscle weakness (generalized): Secondary | ICD-10-CM | POA: Diagnosis not present

## 2023-01-29 DIAGNOSIS — R41841 Cognitive communication deficit: Secondary | ICD-10-CM | POA: Diagnosis not present

## 2023-01-29 DIAGNOSIS — R29898 Other symptoms and signs involving the musculoskeletal system: Secondary | ICD-10-CM | POA: Diagnosis not present

## 2023-01-30 DIAGNOSIS — H04123 Dry eye syndrome of bilateral lacrimal glands: Secondary | ICD-10-CM | POA: Diagnosis not present

## 2023-01-30 DIAGNOSIS — H52203 Unspecified astigmatism, bilateral: Secondary | ICD-10-CM | POA: Diagnosis not present

## 2023-01-30 DIAGNOSIS — H532 Diplopia: Secondary | ICD-10-CM | POA: Diagnosis not present

## 2023-01-30 DIAGNOSIS — H5213 Myopia, bilateral: Secondary | ICD-10-CM | POA: Diagnosis not present

## 2023-01-31 DIAGNOSIS — R29898 Other symptoms and signs involving the musculoskeletal system: Secondary | ICD-10-CM | POA: Diagnosis not present

## 2023-01-31 DIAGNOSIS — M6281 Muscle weakness (generalized): Secondary | ICD-10-CM | POA: Diagnosis not present

## 2023-01-31 DIAGNOSIS — R41841 Cognitive communication deficit: Secondary | ICD-10-CM | POA: Diagnosis not present

## 2023-02-03 DIAGNOSIS — H532 Diplopia: Secondary | ICD-10-CM | POA: Diagnosis not present

## 2023-02-04 DIAGNOSIS — R29898 Other symptoms and signs involving the musculoskeletal system: Secondary | ICD-10-CM | POA: Diagnosis not present

## 2023-02-04 DIAGNOSIS — R41841 Cognitive communication deficit: Secondary | ICD-10-CM | POA: Diagnosis not present

## 2023-02-04 DIAGNOSIS — M6281 Muscle weakness (generalized): Secondary | ICD-10-CM | POA: Diagnosis not present

## 2023-02-05 DIAGNOSIS — M6281 Muscle weakness (generalized): Secondary | ICD-10-CM | POA: Diagnosis not present

## 2023-02-05 DIAGNOSIS — R41841 Cognitive communication deficit: Secondary | ICD-10-CM | POA: Diagnosis not present

## 2023-02-05 DIAGNOSIS — R29898 Other symptoms and signs involving the musculoskeletal system: Secondary | ICD-10-CM | POA: Diagnosis not present

## 2023-02-06 DIAGNOSIS — M47812 Spondylosis without myelopathy or radiculopathy, cervical region: Secondary | ICD-10-CM | POA: Diagnosis not present

## 2023-02-11 DIAGNOSIS — M6281 Muscle weakness (generalized): Secondary | ICD-10-CM | POA: Diagnosis not present

## 2023-02-11 DIAGNOSIS — R29898 Other symptoms and signs involving the musculoskeletal system: Secondary | ICD-10-CM | POA: Diagnosis not present

## 2023-02-11 DIAGNOSIS — R41841 Cognitive communication deficit: Secondary | ICD-10-CM | POA: Diagnosis not present

## 2023-02-13 DIAGNOSIS — M6281 Muscle weakness (generalized): Secondary | ICD-10-CM | POA: Diagnosis not present

## 2023-02-13 DIAGNOSIS — R29898 Other symptoms and signs involving the musculoskeletal system: Secondary | ICD-10-CM | POA: Diagnosis not present

## 2023-02-13 DIAGNOSIS — R41841 Cognitive communication deficit: Secondary | ICD-10-CM | POA: Diagnosis not present

## 2023-02-18 DIAGNOSIS — R41841 Cognitive communication deficit: Secondary | ICD-10-CM | POA: Diagnosis not present

## 2023-02-18 DIAGNOSIS — M6281 Muscle weakness (generalized): Secondary | ICD-10-CM | POA: Diagnosis not present

## 2023-02-18 DIAGNOSIS — R29898 Other symptoms and signs involving the musculoskeletal system: Secondary | ICD-10-CM | POA: Diagnosis not present

## 2023-02-25 ENCOUNTER — Other Ambulatory Visit: Payer: Self-pay | Admitting: Sports Medicine

## 2023-02-25 DIAGNOSIS — F304 Manic episode in full remission: Secondary | ICD-10-CM

## 2023-02-25 NOTE — Telephone Encounter (Signed)
Pharmacy Requested refill.  Epic LR 01/20/2023 Contract Date: 10/17/2022  Pended Rx and sent to [facility] for approval.

## 2023-02-27 DIAGNOSIS — M6281 Muscle weakness (generalized): Secondary | ICD-10-CM | POA: Diagnosis not present

## 2023-02-27 DIAGNOSIS — R29898 Other symptoms and signs involving the musculoskeletal system: Secondary | ICD-10-CM | POA: Diagnosis not present

## 2023-02-27 DIAGNOSIS — R41841 Cognitive communication deficit: Secondary | ICD-10-CM | POA: Diagnosis not present

## 2023-02-28 DIAGNOSIS — R41841 Cognitive communication deficit: Secondary | ICD-10-CM | POA: Diagnosis not present

## 2023-02-28 DIAGNOSIS — M6281 Muscle weakness (generalized): Secondary | ICD-10-CM | POA: Diagnosis not present

## 2023-02-28 DIAGNOSIS — R29898 Other symptoms and signs involving the musculoskeletal system: Secondary | ICD-10-CM | POA: Diagnosis not present

## 2023-03-04 DIAGNOSIS — R29898 Other symptoms and signs involving the musculoskeletal system: Secondary | ICD-10-CM | POA: Diagnosis not present

## 2023-03-04 DIAGNOSIS — M6281 Muscle weakness (generalized): Secondary | ICD-10-CM | POA: Diagnosis not present

## 2023-03-04 DIAGNOSIS — R41841 Cognitive communication deficit: Secondary | ICD-10-CM | POA: Diagnosis not present

## 2023-03-06 DIAGNOSIS — R29898 Other symptoms and signs involving the musculoskeletal system: Secondary | ICD-10-CM | POA: Diagnosis not present

## 2023-03-06 DIAGNOSIS — R41841 Cognitive communication deficit: Secondary | ICD-10-CM | POA: Diagnosis not present

## 2023-03-06 DIAGNOSIS — M6281 Muscle weakness (generalized): Secondary | ICD-10-CM | POA: Diagnosis not present

## 2023-03-11 DIAGNOSIS — R29898 Other symptoms and signs involving the musculoskeletal system: Secondary | ICD-10-CM | POA: Diagnosis not present

## 2023-03-11 DIAGNOSIS — R41841 Cognitive communication deficit: Secondary | ICD-10-CM | POA: Diagnosis not present

## 2023-03-11 DIAGNOSIS — M6281 Muscle weakness (generalized): Secondary | ICD-10-CM | POA: Diagnosis not present

## 2023-03-13 DIAGNOSIS — R41841 Cognitive communication deficit: Secondary | ICD-10-CM | POA: Diagnosis not present

## 2023-03-13 DIAGNOSIS — M6281 Muscle weakness (generalized): Secondary | ICD-10-CM | POA: Diagnosis not present

## 2023-03-13 DIAGNOSIS — R29898 Other symptoms and signs involving the musculoskeletal system: Secondary | ICD-10-CM | POA: Diagnosis not present

## 2023-03-18 DIAGNOSIS — R41841 Cognitive communication deficit: Secondary | ICD-10-CM | POA: Diagnosis not present

## 2023-03-18 DIAGNOSIS — R29898 Other symptoms and signs involving the musculoskeletal system: Secondary | ICD-10-CM | POA: Diagnosis not present

## 2023-03-18 DIAGNOSIS — M6281 Muscle weakness (generalized): Secondary | ICD-10-CM | POA: Diagnosis not present

## 2023-03-20 DIAGNOSIS — R41841 Cognitive communication deficit: Secondary | ICD-10-CM | POA: Diagnosis not present

## 2023-03-20 DIAGNOSIS — R29898 Other symptoms and signs involving the musculoskeletal system: Secondary | ICD-10-CM | POA: Diagnosis not present

## 2023-03-20 DIAGNOSIS — M6281 Muscle weakness (generalized): Secondary | ICD-10-CM | POA: Diagnosis not present

## 2023-03-24 DIAGNOSIS — G245 Blepharospasm: Secondary | ICD-10-CM | POA: Diagnosis not present

## 2023-03-25 DIAGNOSIS — R41841 Cognitive communication deficit: Secondary | ICD-10-CM | POA: Diagnosis not present

## 2023-03-25 DIAGNOSIS — R29898 Other symptoms and signs involving the musculoskeletal system: Secondary | ICD-10-CM | POA: Diagnosis not present

## 2023-03-25 DIAGNOSIS — M6281 Muscle weakness (generalized): Secondary | ICD-10-CM | POA: Diagnosis not present

## 2023-03-27 DIAGNOSIS — R41841 Cognitive communication deficit: Secondary | ICD-10-CM | POA: Diagnosis not present

## 2023-03-27 DIAGNOSIS — R29898 Other symptoms and signs involving the musculoskeletal system: Secondary | ICD-10-CM | POA: Diagnosis not present

## 2023-03-27 DIAGNOSIS — M6281 Muscle weakness (generalized): Secondary | ICD-10-CM | POA: Diagnosis not present

## 2023-04-01 ENCOUNTER — Other Ambulatory Visit: Payer: Self-pay | Admitting: Nurse Practitioner

## 2023-04-01 DIAGNOSIS — R41841 Cognitive communication deficit: Secondary | ICD-10-CM | POA: Diagnosis not present

## 2023-04-01 DIAGNOSIS — M6281 Muscle weakness (generalized): Secondary | ICD-10-CM | POA: Diagnosis not present

## 2023-04-01 DIAGNOSIS — R29898 Other symptoms and signs involving the musculoskeletal system: Secondary | ICD-10-CM | POA: Diagnosis not present

## 2023-04-01 DIAGNOSIS — F304 Manic episode in full remission: Secondary | ICD-10-CM

## 2023-04-01 NOTE — Telephone Encounter (Signed)
 Patient is requesting a refill of the following medications: Requested Prescriptions   Pending Prescriptions Disp Refills   ALPRAZolam (XANAX) 0.5 MG tablet [Pharmacy Med Name: alprazolam 0.5 mg tablet] 60 tablet 0    Sig: Take 1 tablet (0.5 mg total) by mouth 2 (two) times daily as needed. for anxiety    Date of last refill: 02/25/23  Refill amount: 60  Treatment agreement date: October 2024

## 2023-04-03 DIAGNOSIS — R41841 Cognitive communication deficit: Secondary | ICD-10-CM | POA: Diagnosis not present

## 2023-04-03 DIAGNOSIS — R29898 Other symptoms and signs involving the musculoskeletal system: Secondary | ICD-10-CM | POA: Diagnosis not present

## 2023-04-03 DIAGNOSIS — M6281 Muscle weakness (generalized): Secondary | ICD-10-CM | POA: Diagnosis not present

## 2023-04-07 ENCOUNTER — Other Ambulatory Visit: Payer: Self-pay | Admitting: Orthopedic Surgery

## 2023-04-07 ENCOUNTER — Ambulatory Visit: Payer: Self-pay

## 2023-04-07 ENCOUNTER — Telehealth: Payer: Self-pay

## 2023-04-07 ENCOUNTER — Other Ambulatory Visit: Payer: Self-pay | Admitting: Sports Medicine

## 2023-04-07 DIAGNOSIS — E785 Hyperlipidemia, unspecified: Secondary | ICD-10-CM

## 2023-04-07 MED ORDER — PRAVASTATIN SODIUM 10 MG PO TABS: 10.0000 mg | ORAL_TABLET | Freq: Every day | ORAL | 0 refills | Status: DC

## 2023-04-07 NOTE — Telephone Encounter (Signed)
 Called patient and no answer. Unable to leave voicemail. We will need to make another attempt to contact patient.

## 2023-04-07 NOTE — Telephone Encounter (Signed)
Outgoing call placed to patient, no answer, and no voicemail. We will need to make additional attempts to reach patient.

## 2023-04-07 NOTE — Telephone Encounter (Signed)
 Copied from CRM [id number]. Topic: Clinical - Red Word Triage >> Apr 07, 2023 10:55 AM Louie Boston wrote: Red Word that prompted transfer to Nurse Triage: Diarreah 3+weeks >> Apr 07, 2023  3:54 PM Dennison Nancy wrote: Patient called checking on status of getting an appointment after talking to a triage nurse , patient stated triage nurse was going to schedule patient an appointment to follow up after being triage  Please contact patient when the appointment is schedule at [phone]

## 2023-04-07 NOTE — Telephone Encounter (Signed)
 Chief Complaint: Diarrhea Symptoms: patient reports that she has loose bowel movements in the morning Frequency: three weeks Pertinent Negatives: Patient denies vomiting, nausea Disposition: [] ED /[] Urgent Care (no appt availability in office) / [] Appointment(In office/virtual)/ []  [place] [facility]/ [] Home Care/ [] Refused Recommended Disposition /[]  Mobile Bus/ [x]  Follow-up with PCP Additional Notes: patient reports having loose bowel movements in the morning. Patient states she will wake up with a full feeling in abdomen and have a loose bowel movement. Patient states this has been going on for three weeks. Patient endorses when it started she was having several episodes of diarrhea taking pepto bismol and then taking imodium. Patient is requesting to be seen by provider. Unable to make an appointment within protocol time period. Please call patient to set up an appointment. Patient verbalized understanding and all questions answered.    Copied from CRM [id number]. Topic: Clinical - Red Word Triage >> Apr 07, 2023 10:55 AM Louie Boston wrote: Red Word that prompted transfer to Nurse Triage: Diarreah 3+weeks Reason for Disposition  [1] Mild diarrhea (e.g., 1-3 or more stools than normal in past 24 hours) without known cause AND [2] present >  7 days  Answer Assessment - Initial Assessment Questions 1. DIARRHEA SEVERITY: "How bad is the diarrhea?" "How many more stools have you had in the past 24 hours than normal?"    - NO DIARRHEA (SCALE 0)   - MILD (SCALE 1-3): Few loose or mushy BMs; increase of 1-3 stools over normal daily number of stools; mild increase in ostomy output.   -  MODERATE (SCALE 4-7): Increase of 4-6 stools daily over normal; moderate increase in ostomy output.   -  SEVERE (SCALE 8-10; OR "WORST POSSIBLE"): Increase of 7 or more stools daily over normal; moderate increase in ostomy output; incontinence.     Moderate 2. ONSET: "When did the diarrhea begin?"       Started three weeks ago 3. BM CONSISTENCY: "How loose or watery is the diarrhea?"      loose 4. VOMITING: "Are you also vomiting?" If Yes, ask: "How many times in the past 24 hours?"      No 5. ABDOMEN PAIN: "Are you having any abdomen pain?" If Yes, ask: "What does it feel like?" (e.g., crampy, dull, intermittent, constant)      Yes when waking up in the morning-no pain currently 6. ABDOMEN PAIN SEVERITY: If present, ask: "How bad is the pain?"  (e.g., Scale 1-10; mild, moderate, or severe)   - MILD (1-3): doesn't interfere with normal activities, abdomen soft and not tender to touch    - MODERATE (4-7): interferes with normal activities or awakens from sleep, abdomen tender to touch    - SEVERE (8-10): excruciating pain, doubled over, unable to do any normal activities       N/A 7. ORAL INTAKE: If vomiting, "Have you been able to drink liquids?" "How much liquids have you had in the past 24 hours?"     Drinking okay 8. HYDRATION: "Any signs of dehydration?" (e.g., dry mouth [not just dry lips], too weak to stand, dizziness, new weight loss) "When did you last urinate?"     Urinating without any issue, drinking plenty of water 9. EXPOSURE: "Have you traveled to a foreign country recently?" "Have you been exposed to anyone with diarrhea?" "Could you have eaten any food that was spoiled?"     no 10. ANTIBIOTIC USE: "Are you taking antibiotics now or have you taken antibiotics in the  past 2 months?"       no 11. OTHER SYMPTOMS: "Do you have any other symptoms?" (e.g., fever, blood in stool)       Cold symptoms  Protocols used: Diarrhea-A-AH

## 2023-04-07 NOTE — Telephone Encounter (Signed)
 Called patient and phone kept ringing. Unable to leave voicemail. I will make another attempt to contact patient.

## 2023-04-07 NOTE — Telephone Encounter (Signed)
 Copied from CRM [id number]. Topic: Clinical - Red Word Triage >> Apr 07, 2023  3:54 PM Dennison Nancy wrote: Patient called checking on status of getting an appointment after talking to a triage nurse , patient stated triage nurse was going to schedule patient an appointment to follow up after being triage  Please contact patient when the appointment is schedule at [phone]   Spoke with patient this afternoon and wanting to make an appointment with Mast, Man X, NP to discuss about Diarrhea patient states that she have been having loose stools more like two months.  Patient said she is taking Pepto bismol every 6 hours as need.  Patient also states that she talked to the in-house nurse and she suggest Imodium because it is more stronger.  Patient does have an appointment on 05/01/2023 FYI, Patient was encourage if the Diarrhea gets worse to please call the office prior to her visit.   Message sent to Mast, Man X, NP ( Amy Coletta Memos, NP covering for Mast, Man X, NP)

## 2023-04-07 NOTE — Telephone Encounter (Signed)
 Pharmacy requested refill Pended Rx and sent to Amy for  for approval due to HIGH ALERT Warning.  Sent to Amy due to [facility] out of office.

## 2023-04-08 DIAGNOSIS — R29898 Other symptoms and signs involving the musculoskeletal system: Secondary | ICD-10-CM | POA: Diagnosis not present

## 2023-04-08 DIAGNOSIS — R41841 Cognitive communication deficit: Secondary | ICD-10-CM | POA: Diagnosis not present

## 2023-04-08 DIAGNOSIS — M6281 Muscle weakness (generalized): Secondary | ICD-10-CM | POA: Diagnosis not present

## 2023-04-10 DIAGNOSIS — R41841 Cognitive communication deficit: Secondary | ICD-10-CM | POA: Diagnosis not present

## 2023-04-10 DIAGNOSIS — R29898 Other symptoms and signs involving the musculoskeletal system: Secondary | ICD-10-CM | POA: Diagnosis not present

## 2023-04-10 DIAGNOSIS — M6281 Muscle weakness (generalized): Secondary | ICD-10-CM | POA: Diagnosis not present

## 2023-04-14 ENCOUNTER — Ambulatory Visit: Payer: Self-pay

## 2023-04-14 NOTE — Telephone Encounter (Signed)
 Message routed to Dr.V. Patient is scheduled to see er 04/18/2023.

## 2023-04-14 NOTE — Telephone Encounter (Signed)
 Chief Complaint: diarrhea,  Symptoms: diarrhea, bloating Frequency: about 5 weeks 1-3/day Pertinent Negatives: Patient denies fever, blood in stool, abd pain, abx use, foreign travel, dizziness, weakness Disposition: [] ED /[] Urgent Care (no appt availability in office) / [x] Appointment(In office/virtual)/ []  [place] [facility]/ [] Home Care/ [] Refused Recommended Disposition /[] [place] [facility]/ []  Follow-up with PCP Additional Notes: Pt states that she has had diarrhea for 5 weeks, 1-3 times per day. Pt states recently she notes black diarrhea, but is taking pepto and immodium. Pt denies abd pain, states she has discomfort in her rectum right before she needs to have a BM. Pt scheduled 4/4. Pt advised that should she develop N/V, blood in stool, pain in abd, or increase in BM times/day that she needs to call back. Pt states that she does not have any roommates, lives alone within her residence. Pt states that she is drinking enough water.   Copied from CRM [id number]. Topic: Clinical - Red Word Triage >> Apr 14, 2023  9:09 AM Irine Seal wrote: Red Word that prompted transfer to Nurse Triage: black stool- Patient has an appointment on 05/01/23 with man x mast but is requesting a sooner appointment. She reports worsening diarrhea with increasingly loose, black stools. She had been trying to wait for her appointment, but the anti-diarrheal medication packaging advises seeing a doctor. She is also inquiring about the possibility of seeing another doctor sooner. Reason for Disposition  [1] Mild diarrhea (e.g., 1-3 or more stools than normal in past 24 hours) without known cause AND [2] present >  7 days  Answer Assessment - Initial Assessment Questions 1. DIARRHEA SEVERITY: "How bad is the diarrhea?" "How many more stools have you had in the past 24 hours than normal?"    - NO DIARRHEA (SCALE 0)   - MILD (SCALE 1-3): Few loose or mushy BMs; increase of 1-3 stools over normal daily number of stools;  mild increase in ostomy output.   -  MODERATE (SCALE 4-7): Increase of 4-6 stools daily over normal; moderate increase in ostomy output.   -  SEVERE (SCALE 8-10; OR "WORST POSSIBLE"): Increase of 7 or more stools daily over normal; moderate increase in ostomy output; incontinence.     1 episode in last 24 hours 2. ONSET: "When did the diarrhea begin?"      About 5 weeks ago 3. BM CONSISTENCY: "How loose or watery is the diarrhea?"      watery 4. VOMITING: "Are you also vomiting?" If Yes, ask: "How many times in the past 24 hours?"      denies 5. ABDOMEN PAIN: "Are you having any abdomen pain?" If Yes, ask: "What does it feel like?" (e.g., crampy, dull, intermittent, constant)      "Grumbly" 6. ABDOMEN PAIN SEVERITY: If present, ask: "How bad is the pain?"  (e.g., Scale 1-10; mild, moderate, or severe)   - MILD (1-3): doesn't interfere with normal activities, abdomen soft and not tender to touch    - MODERATE (4-7): interferes with normal activities or awakens from sleep, abdomen tender to touch    - SEVERE (8-10): excruciating pain, doubled over, unable to do any normal activities       "I don't really have pain in my stomach" 7. ORAL INTAKE: If vomiting, "Have you been able to drink liquids?" "How much liquids have you had in the past 24 hours?"     Drinking liquids 8. HYDRATION: "Any signs of dehydration?" (e.g., dry mouth [not just dry lips], too weak  to stand, dizziness, new weight loss) "When did you last urinate?"    "Drinking very much" 9. EXPOSURE: "Have you traveled to a foreign country recently?" "Have you been exposed to anyone with diarrhea?" "Could you have eaten any food that was spoiled?"     No travel 10. ANTIBIOTIC USE: "Are you taking antibiotics now or have you taken antibiotics in the past 2 months?"       denies 11. OTHER SYMPTOMS: "Do you have any other symptoms?" (e.g., fever, blood in stool)       No blood, has black stool, taking pepto and imodium  Protocols used:  Diarrhea-A-AH

## 2023-04-15 DIAGNOSIS — R41841 Cognitive communication deficit: Secondary | ICD-10-CM | POA: Diagnosis not present

## 2023-04-15 DIAGNOSIS — R29898 Other symptoms and signs involving the musculoskeletal system: Secondary | ICD-10-CM | POA: Diagnosis not present

## 2023-04-15 DIAGNOSIS — M6281 Muscle weakness (generalized): Secondary | ICD-10-CM | POA: Diagnosis not present

## 2023-04-17 DIAGNOSIS — R41841 Cognitive communication deficit: Secondary | ICD-10-CM | POA: Diagnosis not present

## 2023-04-17 DIAGNOSIS — R29898 Other symptoms and signs involving the musculoskeletal system: Secondary | ICD-10-CM | POA: Diagnosis not present

## 2023-04-17 DIAGNOSIS — M6281 Muscle weakness (generalized): Secondary | ICD-10-CM | POA: Diagnosis not present

## 2023-04-18 ENCOUNTER — Ambulatory Visit: Admitting: Adult Health

## 2023-04-18 ENCOUNTER — Encounter: Payer: Self-pay | Admitting: Adult Health

## 2023-04-18 ENCOUNTER — Encounter: Admitting: Sports Medicine

## 2023-04-18 VITALS — BP 138/80 | HR 50 | Temp 96.8°F | Resp 20 | Ht 61.0 in | Wt 121.2 lb

## 2023-04-18 DIAGNOSIS — R197 Diarrhea, unspecified: Secondary | ICD-10-CM

## 2023-04-18 DIAGNOSIS — Z23 Encounter for immunization: Secondary | ICD-10-CM | POA: Diagnosis not present

## 2023-04-18 DIAGNOSIS — C49A3 Gastrointestinal stromal tumor of small intestine: Secondary | ICD-10-CM | POA: Diagnosis not present

## 2023-04-18 MED ORDER — POLYETHYLENE GLYCOL 3350 17 G PO PACK: 17.0000 g | PACK | Freq: Every day | ORAL | Status: DC | PRN

## 2023-04-18 NOTE — Progress Notes (Signed)
 [facility] clinic  Provider:  Kenard Gower DNP  Code Status:  DNR  Goals of Care:     01/23/2023    3:15 PM  Advanced Directives  Does Patient Have a Medical Advance Directive? Yes  Type of Advance Directive Healthcare Power of Attorney  Does patient want to make changes to medical advance directive? No - Patient declined  Copy of Healthcare Power of Attorney in Chart? Yes - validated most recent copy scanned in chart (See row information)     Chief Complaint  Patient presents with   Diarrhea    Discuss the need for Tdap    Discussed the use of AI scribe software for clinical note transcription with the patient, who gave verbal consent to proceed.  HPI: Patient is a 83 y.o. female seen today for an acute visit for diarrhea.  She has been experiencing diarrhea for the past three weeks, describing it as watery with a distinct smell, occurring once daily, typically in the morning. No abdominal pain, nausea, or vomiting associated with the diarrhea. Her stool color changed from black to brown after altering her diet to include bananas, rice, and bread, and stopping the use of Imodium and Pepto-Bismol. She stopped taking MiraLAX when the diarrhea began. She also takes magnesium 400 mg daily, though she is unsure of the reason for its prescription.  She has a history of gastrointestinal stromal tumor (GIST) diagnosed in 2009, treated with surgery and chemotherapy. She has been advised to take MiraLAX daily to prevent blockages due to past intestinal surgeries and blockages.  Her past medical history includes cervical dystonia, which she manages with physical therapy, and a knee replacement complicated by a systemic infection.  She resides at [facility] and has a family history of cancer, with her husband and son having passed away from cancer. She has two children, one of whom is deceased, and several grandchildren.    Past Medical History:   Diagnosis Date   Anemia    Anxiety    Arthritis    Cardiac conduction disorder 03/30/2012   Overview:  STORY: ETT 03/09/2012 Echo 03/07/2012 normal Dr Jacinto Halim, bradycardia felt due to glaucoma eye drops   Cervical dystonia    Diverticulosis of colon    DOE (dyspnea on exertion) 03/25/2018   Gastroesophageal cancer (HCC)    GERD (gastroesophageal reflux disease)    GIST (gastrointestinal stroma tumor), malignant, colon (HCC)    Glaucoma    Heart murmur    History of colon polyps 10/24/2008   Hypertension    Major neurocognitive disorder due to Parkinson's disease, possible    Tremors possible parkinsons   Manic disorder, single episode, in full remission (HCC) 12/01/2009   Sixth nerve palsy     Past Surgical History:  Procedure Laterality Date   BILATERAL SALPINGOOPHORECTOMY  09/22/2007   CATARACT EXTRACTION Bilateral    ESOPHAGOGASTRODUODENOSCOPY (EGD) WITH PROPOFOL N/A 03/25/2022   Procedure: ESOPHAGOGASTRODUODENOSCOPY (EGD) WITH PROPOFOL;  Surgeon: Sherrilyn Rist, MD;  Location: [facility] ENDOSCOPY;  Service: Gastroenterology;  Laterality: N/A;   Gastrointestinal Stroma Tumor,  Other  1960   GIST Surgery   ILEOCECETOMY  09/22/2007   OVARIAN CYST REMOVAL Right 1967   SMALL INTESTINE SURGERY  09/22/2007   TOTAL KNEE ARTHROPLASTY Right 10/05/2019   Procedure: RIGHT TOTAL KNEE ARTHROPLASTY;  Surgeon: Cammy Copa, MD;  Location: [facility] OR;  Service: Orthopedics;  Laterality: Right;   TOTAL VAGINAL HYSTERECTOMY  1986   Fibroids  Allergies  Allergen Reactions   Lisinopril Cough   Penicillins Itching    50 years ago   Adhesive [Tape] Itching   Atorvastatin Itching   Dilaudid [Hydromorphone Hcl] Itching   Hydromorphone Itching    Outpatient Encounter Medications as of 04/18/2023  Medication Sig   ALPRAZolam (XANAX) 0.5 MG tablet Take 1 tablet (0.5 mg total) by mouth 2 (two) times daily as needed. for anxiety   Artificial Saliva (BIOTENE DRY MOUTH) LOZG Use as directed 1  tablet in the mouth or throat 4 (four) times daily as needed.   aspirin 81 MG chewable tablet Chew 81 mg by mouth daily.   bismuth subsalicylate (PEPTO BISMOL) 262 MG/15ML suspension Take 30 mLs by mouth every 6 (six) hours as needed for diarrhea or loose stools.   clindamycin (CLEOCIN) 150 MG capsule Take 600mg  1 hour prior to dental procedure   latanoprost (XALATAN) 0.005 % ophthalmic solution Place 1 drop into both eyes at bedtime.   lithium carbonate 150 MG capsule Take one capsule by mouth twice daily with meals.   losartan (COZAAR) 50 MG tablet TAKE ONE TABLET BY MOUTH ONCE DAILY   MAGNESIUM PO Take 400 mg by mouth daily.   mirabegron ER (MYRBETRIQ) 25 MG TB24 tablet Take 1 tablet (25 mg total) by mouth daily.   Multiple Vitamin (MULTIVITAMIN) tablet Take 1 tablet by mouth daily.   omeprazole (PRILOSEC) 20 MG capsule Take 1 capsule (20 mg total) by mouth daily.   pravastatin (PRAVACHOL) 10 MG tablet Take 1 tablet (10 mg total) by mouth daily.   RESTASIS 0.05 % ophthalmic emulsion Place 1 drop into both eyes 2 (two) times daily.   topiramate (TOPAMAX) 25 MG tablet TAKE ONE TABLET BY MOUTH TWICE DAILY IN THE MORNING AND IN THE EVENING   triamcinolone cream (KENALOG) 0.5 % APPLY TO THE AFFECTED AREA(S) TOPICALLY TWICE DAILY   triamcinolone ointment (KENALOG) 0.5 % Apply 1 Application topically 2 (two) times daily as needed (place on leg).   [DISCONTINUED] polyethylene glycol (MIRALAX / GLYCOLAX) 17 g packet Take 17 g by mouth daily.   DULoxetine (CYMBALTA) 20 MG capsule Take 1 capsule (20 mg total) by mouth daily. (Patient not taking: Reported on 04/18/2023)   polyethylene glycol (MIRALAX / GLYCOLAX) 17 g packet Take 17 g by mouth daily as needed.   No facility-administered encounter medications on file as of 04/18/2023.    Review of Systems:  Review of Systems  Constitutional:  Negative for appetite change, chills, fatigue and fever.  HENT:  Negative for congestion, hearing loss, rhinorrhea  and sore throat.   Eyes: Negative.   Respiratory:  Negative for cough, shortness of breath and wheezing.   Cardiovascular:  Negative for chest pain, palpitations and leg swelling.  Gastrointestinal:  Positive for diarrhea. Negative for abdominal pain, constipation, nausea and vomiting.  Genitourinary:  Negative for dysuria.  Musculoskeletal:  Negative for arthralgias, back pain and myalgias.  Skin:  Negative for color change, rash and wound.  Neurological:  Negative for dizziness, weakness and headaches.  Psychiatric/Behavioral:  Negative for behavioral problems. The patient is not nervous/anxious.     Health Maintenance  Topic Date Due   Medicare Annual Wellness (AWV)  05/01/2023 (Originally 02/08/2023)   COVID-19 Vaccine (6 - 2024-25 season) 07/31/2023 (Originally 01/08/2023)   INFLUENZA VACCINE  08/15/2023   DTaP/Tdap/Td (3 - Td or Tdap) 04/17/2033   Pneumonia Vaccine 56+ Years old  Completed   DEXA SCAN  Completed   Zoster Vaccines- Shingrix  Completed  HPV VACCINES  Aged Out    Physical Exam: Vitals:   04/18/23 1330  BP: 138/80  Pulse: (!) 50  Resp: 20  Temp: (!) 96.8 F (36 C)  SpO2: 99%  Weight: 121 lb 3.2 oz (55 kg)  Height: 5\' 1"  (1.549 m)   Body mass index is 22.9 kg/m. Physical Exam Constitutional:      General: She is not in acute distress.    Appearance: Normal appearance.  HENT:     Head: Normocephalic and atraumatic.     Nose: Nose normal.     Mouth/Throat:     Mouth: Mucous membranes are moist.  Eyes:     Conjunctiva/sclera: Conjunctivae normal.  Cardiovascular:     Rate and Rhythm: Normal rate and regular rhythm.  Pulmonary:     Effort: Pulmonary effort is normal.     Breath sounds: Normal breath sounds.  Abdominal:     General: Bowel sounds are normal.     Palpations: Abdomen is soft.  Musculoskeletal:        General: Normal range of motion.     Cervical back: Normal range of motion.  Skin:    General: Skin is warm and dry.   Neurological:     General: No focal deficit present.     Mental Status: She is alert and oriented to person, place, and time.  Psychiatric:        Mood and Affect: Mood normal.        Behavior: Behavior normal.        Thought Content: Thought content normal.        Judgment: Judgment normal.     Labs reviewed: Basic Metabolic Panel: Recent Labs    07/30/22 0708 04/18/23 1524  NA  --  140  K  --  4.1  CL  --  113*  CO2  --  23  GLUCOSE  --  96  BUN  --  15  CREATININE  --  0.79  CALCIUM  --  10.3  MG  --  2.1  TSH 2.08  --    Liver Function Tests: Recent Labs    04/18/23 1524  AST 18  ALT 12  BILITOT 0.5  PROT 6.5   No results for input(s): "LIPASE", "AMYLASE" in the last 8760 hours. No results for input(s): "AMMONIA" in the last 8760 hours. CBC: Recent Labs    07/30/22 0000 07/30/22 0708 04/18/23 1524  WBC 7.9 7.9 8.0  NEUTROABS 6,028.00 6,028 5,720  HGB 12.2 12.2 12.2  HCT 36 36.2 37.0  MCV  --  98.9 97.1  PLT 292 292 277   Lipid Panel: Recent Labs    07/30/22 0708  CHOL 241*  HDL 74  LDLCALC 137*  TRIG 166*  CHOLHDL 3.3   No results found for: "HGBA1C"  Procedures since last visit: No results found.  Assessment/Plan  1. Diarrhea, unspecified type (Primary) -  diarrhea for three weeks, watery and loose, with recent dietary changes. Differential includes C. difficile due to antibiotic use and hospitalization history. GIST and past bowel obstructions may contribute. - Provide stool sample container for C. difficile testing. - Order CBC, electrolytes, kidney function, and magnesium level. - Change MiraLAX to as needed. - Hold magnesium supplementation. - CBC with Differential/Platelets - Complete Metabolic Panel with eGFR - Magnesium - Ambulatory referral to Gastroenterology - Clostridium difficile culture-fecal  2. 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 No current gastroenterology follow-up. Last colonoscopy in 2016, repeat recommended but not done. - Refer to gastroenterology for follow-up and potential colonoscopy. - Ambulatory referral to Gastroenterology  3. Need for Tdap vaccination - Tdap vaccine greater than or equal to 7yo IM     Labs/tests ordered:   CBC, CMP, stool culture for C-difficile, magnesium   Return if symptoms worsen or fail to improve.  Kenard Gower, NP

## 2023-04-19 LAB — CBC WITH DIFFERENTIAL/PLATELET
Absolute Lymphocytes: 1600 {cells}/uL (ref 850–3900)
Absolute Monocytes: 480 {cells}/uL (ref 200–950)
Basophils Absolute: 48 {cells}/uL (ref 0–200)
Basophils Relative: 0.6 %
Eosinophils Absolute: 152 {cells}/uL (ref 15–500)
Eosinophils Relative: 1.9 %
HCT: 37 % (ref 35.0–45.0)
Hemoglobin: 12.2 g/dL (ref 11.7–15.5)
MCH: 32 pg (ref 27.0–33.0)
MCHC: 33 g/dL (ref 32.0–36.0)
MCV: 97.1 fL (ref 80.0–100.0)
MPV: 9.7 fL (ref 7.5–12.5)
Monocytes Relative: 6 %
Neutro Abs: 5720 {cells}/uL (ref 1500–7800)
Neutrophils Relative %: 71.5 %
Platelets: 277 10*3/uL (ref 140–400)
RBC: 3.81 10*6/uL (ref 3.80–5.10)
RDW: 11.9 % (ref 11.0–15.0)
Total Lymphocyte: 20 %
WBC: 8 10*3/uL (ref 3.8–10.8)

## 2023-04-19 LAB — COMPLETE METABOLIC PANEL WITHOUT GFR
AG Ratio: 1.8 (calc) (ref 1.0–2.5)
ALT: 12 U/L (ref 6–29)
AST: 18 U/L (ref 10–35)
Albumin: 4.2 g/dL (ref 3.6–5.1)
Alkaline phosphatase (APISO): 69 U/L (ref 37–153)
BUN: 15 mg/dL (ref 7–25)
CO2: 23 mmol/L (ref 20–32)
Calcium: 10.3 mg/dL (ref 8.6–10.4)
Chloride: 113 mmol/L — ABNORMAL HIGH (ref 98–110)
Creat: 0.79 mg/dL (ref 0.60–0.95)
Globulin: 2.3 g/dL (ref 1.9–3.7)
Glucose, Bld: 96 mg/dL (ref 65–139)
Potassium: 4.1 mmol/L (ref 3.5–5.3)
Sodium: 140 mmol/L (ref 135–146)
Total Bilirubin: 0.5 mg/dL (ref 0.2–1.2)
Total Protein: 6.5 g/dL (ref 6.1–8.1)

## 2023-04-19 LAB — MAGNESIUM: Magnesium: 2.1 mg/dL (ref 1.5–2.5)

## 2023-04-21 ENCOUNTER — Telehealth: Payer: Self-pay | Admitting: *Deleted

## 2023-04-21 NOTE — Progress Notes (Signed)
 This encounter was created in error - please disregard.

## 2023-04-21 NOTE — Telephone Encounter (Signed)
 Copied from CRM [id number]. Topic: Clinical - Request for Lab/Test Order >> Apr 21, 2023  1:29 PM [name] wrote: Reason for CRM: Patient is suppose to bring back a bowel sample and she wants to know if she should freeze it, put in In the fridge or keep at room tempeture, patients callback number [phone].Patient said she can't bring it in until tomorrow.    Confirmed with Lab, Beth, To refrigerate until tomorrow when she bring into office. Patient notified and agreed.

## 2023-04-22 DIAGNOSIS — R29898 Other symptoms and signs involving the musculoskeletal system: Secondary | ICD-10-CM | POA: Diagnosis not present

## 2023-04-22 DIAGNOSIS — M6281 Muscle weakness (generalized): Secondary | ICD-10-CM | POA: Diagnosis not present

## 2023-04-22 DIAGNOSIS — R41841 Cognitive communication deficit: Secondary | ICD-10-CM | POA: Diagnosis not present

## 2023-04-22 DIAGNOSIS — R197 Diarrhea, unspecified: Secondary | ICD-10-CM | POA: Diagnosis not present

## 2023-04-22 NOTE — Progress Notes (Signed)
-    Magnesium level is normal, does not need Magnesium supplement -  no anemia -  electrolytes and liver enzymes normal

## 2023-04-24 ENCOUNTER — Ambulatory Visit: Payer: Self-pay

## 2023-04-24 DIAGNOSIS — R29898 Other symptoms and signs involving the musculoskeletal system: Secondary | ICD-10-CM | POA: Diagnosis not present

## 2023-04-24 DIAGNOSIS — M6281 Muscle weakness (generalized): Secondary | ICD-10-CM | POA: Diagnosis not present

## 2023-04-24 DIAGNOSIS — R41841 Cognitive communication deficit: Secondary | ICD-10-CM | POA: Diagnosis not present

## 2023-04-24 NOTE — Telephone Encounter (Addendum)
 Chief Complaint: diarrhea (like mashed potatoes) Symptoms: mild diarrhea  Frequency: on and off since OV  Pertinent Negatives: Patient denies abd pain  Disposition: [] ED /[] Urgent Care (no appt availability in office) / [] Appointment(In office/virtual)/ []  [place] [facility]/ [x] Home Care/ [] Refused Recommended Disposition /[] [facility] [facility]/ []  Follow-up with PCP Additional Notes: pt stated that she will stop Magnesium and Boost due to concern may be contributing  to the diarrhea. Advised pt to call back if diarrhea continues. Routing to provider to review. Talked to pt at length  Message from [place] H sent at 04/24/2023  1:38 PM EDT  Summary: diarrhea   Copied From CRM [id number]. Reason for Triage: patient is experiencing on and off diarrhea. Has been treated for this before and was told to call back if symptoms didn't improved. She said she would be fine one minute and the symptoms would improve and then it get bad and then improves again. Said there's something that she takes that helps and wants to know if she should just take it again or if she needs to schedule an appointment. Patient called from number, [phone]         Reason for Disposition  [1] Mild diarrhea (e.g., 1-3 or more stools than normal in past 24 hours) without known cause AND [2] present >  7 days  Answer Assessment - Initial Assessment Questions 1. DIARRHEA SEVERITY: "How bad is the diarrhea?" "How many more stools have you had in the past 24 hours than normal?"    - NO DIARRHEA (SCALE 0)   - MILD (SCALE 1-3): Few loose or mushy BMs; increase of 1-3 stools over normal daily number of stools; mild increase in ostomy output.   -  MODERATE (SCALE 4-7): Increase of 4-6 stools daily over normal; moderate increase in ostomy output.   -  SEVERE (SCALE 8-10; OR "WORST POSSIBLE"): Increase of 7 or more stools daily over normal; moderate increase in ostomy output; incontinence.     Mild  2. ONSET: "When did the  diarrhea begin?"      After OV 3. BM CONSISTENCY: "How loose or watery is the diarrhea?"    Pasty like mashed potatoes 4. VOMITING: "Are you also vomiting?" If Yes, ask: "How many times in the past 24 hours?"      no 5. ABDOMEN PAIN: "Are you having any abdomen pain?" If Yes, ask: "What does it feel like?" (e.g., crampy, dull, intermittent, constant)      no 6. ABDOMEN PAIN SEVERITY: If present, ask: "How bad is the pain?"  (e.g., Scale 1-10; mild, moderate, or severe)   - MILD (1-3): doesn't interfere with normal activities, abdomen soft and not tender to touch    - MODERATE (4-7): interferes with normal activities or awakens from sleep, abdomen tender to touch    - SEVERE (8-10): excruciating pain, doubled over, unable to do any normal activities       none 7. ORAL INTAKE: If vomiting, "Have you been able to drink liquids?" "How much liquids have you had in the past 24 hours?"     N/a 8. HYDRATION: "Any signs of dehydration?" (e.g., dry mouth [not just dry lips], too weak to stand, dizziness, new weight loss) "When did you last urinate?"     no 9. EXPOSURE: "Have you traveled to a foreign country recently?" "Have you been exposed to anyone with diarrhea?" "Could you have eaten any food that was spoiled?"     no 10. ANTIBIOTIC USE: "Are you  taking antibiotics now or have you taken antibiotics in the past 2 months?"       no 11. OTHER SYMPTOMS: "Do you have any other symptoms?" (e.g., fever, blood in stool)       None  Protocols used: Diarrhea-A-AH

## 2023-04-25 DIAGNOSIS — M6281 Muscle weakness (generalized): Secondary | ICD-10-CM | POA: Diagnosis not present

## 2023-04-25 DIAGNOSIS — R41841 Cognitive communication deficit: Secondary | ICD-10-CM | POA: Diagnosis not present

## 2023-04-25 DIAGNOSIS — R29898 Other symptoms and signs involving the musculoskeletal system: Secondary | ICD-10-CM | POA: Diagnosis not present

## 2023-04-27 LAB — CLOSTRIDIUM DIFFICILE CULTURE-FECAL

## 2023-04-28 DIAGNOSIS — M6281 Muscle weakness (generalized): Secondary | ICD-10-CM | POA: Diagnosis not present

## 2023-04-28 DIAGNOSIS — R29898 Other symptoms and signs involving the musculoskeletal system: Secondary | ICD-10-CM | POA: Diagnosis not present

## 2023-04-28 DIAGNOSIS — R41841 Cognitive communication deficit: Secondary | ICD-10-CM | POA: Diagnosis not present

## 2023-04-29 DIAGNOSIS — R41841 Cognitive communication deficit: Secondary | ICD-10-CM | POA: Diagnosis not present

## 2023-04-29 DIAGNOSIS — M6281 Muscle weakness (generalized): Secondary | ICD-10-CM | POA: Diagnosis not present

## 2023-04-29 DIAGNOSIS — R29898 Other symptoms and signs involving the musculoskeletal system: Secondary | ICD-10-CM | POA: Diagnosis not present

## 2023-04-30 ENCOUNTER — Ambulatory Visit: Payer: Self-pay | Admitting: Nurse Practitioner

## 2023-04-30 NOTE — Telephone Encounter (Signed)
 See Triage Notes from Triage Nurse FYI. Message sent to Mast, Man X, NP

## 2023-04-30 NOTE — Telephone Encounter (Signed)
 Copied from CRM [id number]. Topic: Clinical - Medical Advice >> Apr 30, 2023 10:24 AM Brynn Caras wrote: Reason for CRM: The patient states she is no longer experiencing on and off diarrhea. She would like to know if she should consume fruit in her diet and if she should start back using a laxative? She states she has been experiencing irregular constipation and because she has a history of gastrointestinal stromal tumor (GIST) diagnosed in 2009, treated with surgery and chemotherapy, she was advised to take a laxative for no constipation after this surgery.  Callback [phone]   Chief Complaint: medication question Symptoms: No symptoms Frequency: n/a Pertinent Negatives: Patient denies pain  Disposition: [] ED /[] Urgent Care (no appt availability in office) / [] Appointment(In office/virtual)/ []  [place] [facility]/ [] Home Care/ [] Refused Recommended Disposition /[] [place] [facility]/ [x]  Follow-up with PCP Additional Notes: Pt with history of GIST, was advised to take Miralax daily. Pt states that she had diarrhea and stopped taking it during that time. Now, patient reports normal Bms, but is asking if she should resume taking Miralax daily now. RN advising that the order for Miralax daily was discontinued and she is now to take it only as needed. Pt expressed her concern with that because it didn't come from her usual PCP and she would like to get clarification. Pt also asking if she can eat fruit in her new diet. RN will defer to clinic to answer pt concerns, pt agreeable to this plan.  Reason for Disposition  [1] Caller has NON-URGENT medicine question about med that PCP prescribed AND [2] triager unable to answer question  Answer Assessment - Initial Assessment Questions 1. NAME of MEDICINE: "What medicine(s) are you calling about?"     Miralax  2. QUESTION: "What is your question?" (e.g., double dose of medicine, side effect)     Can pt resume daily  3. PRESCRIBER: "Who  prescribed the medicine?" Reason: if prescribed by specialist, call should be referred to that group.     Man X Mast  4. SYMPTOMS: "Do you have any symptoms?" If Yes, ask: "What symptoms are you having?"  "How bad are the symptoms (e.g., mild, moderate, severe)     No symptomsn  5. PREGNANCY:  "Is there any chance that you are pregnant?" "When was your last menstrual period?"     no  Protocols used: Medication Question Call-A-AH

## 2023-05-01 ENCOUNTER — Telehealth: Payer: Self-pay

## 2023-05-01 ENCOUNTER — Encounter: Admitting: Nurse Practitioner

## 2023-05-01 NOTE — Telephone Encounter (Signed)
 Spoke with patient and Mast, Man X, NP, patient was told to take Miralax every other day as needed and was told that she can have fruit with her new diet.

## 2023-05-05 ENCOUNTER — Other Ambulatory Visit: Payer: Self-pay | Admitting: Nurse Practitioner

## 2023-05-05 DIAGNOSIS — F304 Manic episode in full remission: Secondary | ICD-10-CM

## 2023-05-06 DIAGNOSIS — R41841 Cognitive communication deficit: Secondary | ICD-10-CM | POA: Diagnosis not present

## 2023-05-06 DIAGNOSIS — R29898 Other symptoms and signs involving the musculoskeletal system: Secondary | ICD-10-CM | POA: Diagnosis not present

## 2023-05-06 DIAGNOSIS — M6281 Muscle weakness (generalized): Secondary | ICD-10-CM | POA: Diagnosis not present

## 2023-05-08 DIAGNOSIS — G243 Spasmodic torticollis: Secondary | ICD-10-CM | POA: Diagnosis not present

## 2023-05-08 DIAGNOSIS — R29898 Other symptoms and signs involving the musculoskeletal system: Secondary | ICD-10-CM | POA: Diagnosis not present

## 2023-05-08 DIAGNOSIS — M6281 Muscle weakness (generalized): Secondary | ICD-10-CM | POA: Diagnosis not present

## 2023-05-08 DIAGNOSIS — R519 Headache, unspecified: Secondary | ICD-10-CM | POA: Diagnosis not present

## 2023-05-08 DIAGNOSIS — M47812 Spondylosis without myelopathy or radiculopathy, cervical region: Secondary | ICD-10-CM | POA: Diagnosis not present

## 2023-05-08 DIAGNOSIS — R41841 Cognitive communication deficit: Secondary | ICD-10-CM | POA: Diagnosis not present

## 2023-05-09 ENCOUNTER — Non-Acute Institutional Stay: Admitting: Sports Medicine

## 2023-05-09 ENCOUNTER — Encounter: Payer: Self-pay | Admitting: Sports Medicine

## 2023-05-09 VITALS — BP 126/78 | HR 50 | Temp 97.8°F | Ht 61.0 in | Wt 119.0 lb

## 2023-05-09 DIAGNOSIS — I10 Essential (primary) hypertension: Secondary | ICD-10-CM | POA: Diagnosis not present

## 2023-05-09 DIAGNOSIS — R197 Diarrhea, unspecified: Secondary | ICD-10-CM

## 2023-05-09 NOTE — Progress Notes (Signed)
 Careteam: Patient Care Team: Mast, Man X, NP as PCP - General (Internal Medicine) Valeen Gartner, MD as Consulting Physician (Gynecologic Oncology) Sumner Ends, MD as Consulting Physician (Oncology) Serafin Dames, MD as Consulting Physician (Gastroenterology) Maris Sickle, MD as Consulting Physician (Ophthalmology) Annabell Key Portia Brittle, MD as Referring Physician (Neurology) Tat, Von Grumbling, DO as Consulting Physician (Neurology)  PLACE OF SERVICE:  [facility]  Advanced Directive information    Allergies  Allergen Reactions   Lisinopril Cough   Penicillins Itching    50 years ago   Adhesive [Tape] Itching   Atorvastatin Itching   Dilaudid  [Hydromorphone  Hcl] Itching   Hydromorphone  Itching    Chief Complaint  Patient presents with   Diarrhea    Patient c/o diarrhea x 1 week off/on. Patient requesting review of medications that could contribute to diarrhea     Discussed the use of AI scribe software for clinical note transcription with the patient, who gave verbal consent to proceed.  History of Present Illness    Tracey W Lavey "Debria Fang" is an 83 year old female who presents with diarrhea and constipation.  She has been experiencing diarrhea for the past couple of months.Initially, she tried Pepto Bismol and then Imodium, but neither provided relief. Currently, she experiences diarrhea once in the morning, though sometimes her stools are not loose. She has a history of a gastrointestinal tumor, which was surgically removed.    She experiences alternating patterns of constipation and diarrhea, which she finds concerning. She did not had bowel movements for 2 days and felt crampy pain in her belly yesterday but denies pain today. She ate her breakfast, took a laxative this morning and had a bowel movement.     She experiences dizziness when standing up, which she describes as occurring when she 'stand[s] up in the turnaround'. No chest pain, difficulty  breathing, or pain with urination. She has not noticed any blood in her urine or stool recently, although her stools were black at one point.  She lives in an apartment and walks to the dining room for meals. She eats small meals and snacks throughout the day, totaling about five meals. She participates in physical therapy twice a week, focusing on her stiff neck and shoulders. She reports difficulty sleeping, stating she sleeps for a couple of hours at a time before waking up.  Review of Systems:  Review of Systems  Constitutional:  Negative for chills and fever.  HENT:  Negative for congestion and sore throat.   Eyes:  Negative for double vision.  Respiratory:  Negative for cough, sputum production and shortness of breath.   Cardiovascular:  Negative for chest pain, palpitations and leg swelling.  Gastrointestinal:  Positive for constipation and diarrhea. Negative for abdominal pain, heartburn and nausea.  Genitourinary:  Negative for dysuria, frequency and hematuria.  Musculoskeletal:  Negative for myalgias.  Neurological:  Negative for dizziness.   Negative unless indicated in HPI.   Past Medical History:  Diagnosis Date   Anemia    Anxiety    Arthritis    Cardiac conduction disorder 03/30/2012   Overview:  STORY: ETT 03/09/2012 Echo 03/07/2012 normal Dr Berry [place], bradycardia felt due to glaucoma eye drops   Cervical dystonia    Diverticulosis of colon    DOE (dyspnea on exertion) 03/25/2018   Gastroesophageal cancer (HCC)    GERD (gastroesophageal reflux disease)    GIST (gastrointestinal stroma tumor), malignant, colon (HCC)    Glaucoma    Heart murmur  History of colon polyps 10/24/2008   Hypertension    Major neurocognitive disorder due to Parkinson's disease, possible    Tremors possible parkinsons   Manic disorder, single episode, in full remission (HCC) 12/01/2009   Sixth nerve palsy    Past Surgical History:  Procedure Laterality Date   BILATERAL SALPINGOOPHORECTOMY   09/22/2007   CATARACT EXTRACTION Bilateral    ESOPHAGOGASTRODUODENOSCOPY (EGD) WITH PROPOFOL  N/A 03/25/2022   Procedure: ESOPHAGOGASTRODUODENOSCOPY (EGD) WITH PROPOFOL ;  Surgeon: Albertina Hugger, MD;  Location: [facility];  Service: Gastroenterology;  Laterality: N/A;   Gastrointestinal Stroma Tumor,  Other  1960   GIST Surgery   ILEOCECETOMY  09/22/2007   OVARIAN CYST REMOVAL Right 1967   SMALL INTESTINE SURGERY  09/22/2007   TOTAL KNEE ARTHROPLASTY Right 10/05/2019   Procedure: RIGHT TOTAL KNEE ARTHROPLASTY;  Surgeon: Jasmine Mesi, MD;  Location: [facility] OR;  Service: Orthopedics;  Laterality: Right;   TOTAL VAGINAL HYSTERECTOMY  1986   Fibroids   Social History:   reports that she has never smoked. She has never used smokeless tobacco. She reports that she does not drink alcohol and does not use drugs.  Family History  Problem Relation Age of Onset   Congestive Heart Failure Mother    Dementia Mother    Heart disease Father    Diabetes Father    Lung cancer Son    Liver disease Neg Hx    Esophageal cancer Neg Hx    Colon cancer Neg Hx     Medications: Patient's Medications  New Prescriptions   No medications on file  Previous Medications   ALPRAZOLAM  (XANAX ) 0.5 MG TABLET    Take 1 tablet (0.5 mg total) by mouth 2 (two) times daily as needed. for anxiety   ARTIFICIAL SALIVA (BIOTENE DRY MOUTH) LOZG    Use as directed 1 tablet in the mouth or throat 4 (four) times daily as needed.   ASPIRIN  81 MG CHEWABLE TABLET    Chew 81 mg by mouth daily.   BISMUTH SUBSALICYLATE (PEPTO BISMOL) 262 MG/15ML SUSPENSION    Take 30 mLs by mouth every 6 (six) hours as needed for diarrhea or loose stools.   CLINDAMYCIN  (CLEOCIN ) 150 MG CAPSULE    Take 600mg  1 hour prior to dental procedure   DULOXETINE  (CYMBALTA ) 20 MG CAPSULE    Take 1 capsule (20 mg total) by mouth daily.   LATANOPROST  (XALATAN ) 0.005 % OPHTHALMIC SOLUTION    Place 1 drop into both eyes at bedtime.   LITHIUM  CARBONATE  150 MG CAPSULE    TAKE ONE CAPSULE BY MOUTH TWICE DAILY WITH MEALS   LOSARTAN  (COZAAR ) 50 MG TABLET    TAKE ONE TABLET BY MOUTH ONCE DAILY   MAGNESIUM  PO    Take 400 mg by mouth daily.   MIRABEGRON  ER (MYRBETRIQ ) 25 MG TB24 TABLET    Take 1 tablet (25 mg total) by mouth daily.   MULTIPLE VITAMIN (MULTIVITAMIN) TABLET    Take 1 tablet by mouth daily.   OMEPRAZOLE  (PRILOSEC) 20 MG CAPSULE    Take 1 capsule (20 mg total) by mouth daily.   POLYETHYLENE GLYCOL (MIRALAX  / GLYCOLAX ) 17 G PACKET    Take 17 g by mouth daily as needed.   PRAVASTATIN  (PRAVACHOL ) 10 MG TABLET    Take 1 tablet (10 mg total) by mouth daily.   RESTASIS 0.05 % OPHTHALMIC EMULSION    Place 1 drop into both eyes 2 (two) times daily.   TOPIRAMATE (TOPAMAX) 25 MG  TABLET    TAKE ONE TABLET BY MOUTH TWICE DAILY IN THE MORNING AND IN THE EVENING   TRIAMCINOLONE  CREAM (KENALOG ) 0.5 %    APPLY TO THE AFFECTED AREA(S) TOPICALLY TWICE DAILY   TRIAMCINOLONE  OINTMENT (KENALOG ) 0.5 %    Apply 1 Application topically 2 (two) times daily as needed (place on leg).  Modified Medications   No medications on file  Discontinued Medications   No medications on file    Physical Exam: Vitals:   05/09/23 0745  Height: 5\' 1"  (1.549 m)   Body mass index is 22.9 kg/m. BP Readings from Last 3 Encounters:  04/18/23 138/80  01/23/23 120/68  10/17/22 126/70   Wt Readings from Last 3 Encounters:  04/18/23 121 lb 3.2 oz (55 kg)  01/23/23 123 lb 12.8 oz (56.2 kg)  09/19/22 123 lb 3.2 oz (55.9 kg)    Physical Exam Constitutional:      Appearance: Normal appearance.  HENT:     Head: Normocephalic and atraumatic.  Cardiovascular:     Rate and Rhythm: Normal rate and regular rhythm.  Pulmonary:     Effort: Pulmonary effort is normal. No respiratory distress.     Breath sounds: Normal breath sounds. No wheezing.  Abdominal:     General: Bowel sounds are normal. There is no distension.     Tenderness: There is no abdominal tenderness. There  is no guarding or rebound.     Comments:    Neurological:     Mental Status: She is alert. Mental status is at baseline.     Motor: No weakness.     Labs reviewed: Basic Metabolic Panel: Recent Labs    07/30/22 0708 04/18/23 1524  NA  --  140  K  --  4.1  CL  --  113*  CO2  --  23  GLUCOSE  --  96  BUN  --  15  CREATININE  --  0.79  CALCIUM  --  10.3  MG  --  2.1  TSH 2.08  --    Liver Function Tests: Recent Labs    04/18/23 1524  AST 18  ALT 12  BILITOT 0.5  PROT 6.5   No results for input(s): "LIPASE", "AMYLASE" in the last 8760 hours. No results for input(s): "AMMONIA" in the last 8760 hours. CBC: Recent Labs    07/30/22 0000 07/30/22 0708 04/18/23 1524  WBC 7.9 7.9 8.0  NEUTROABS 6,028.00 6,028 5,720  HGB 12.2 12.2 12.2  HCT 36 36.2 37.0  MCV  --  98.9 97.1  PLT 292 292 277   Lipid Panel: Recent Labs    07/30/22 0708  CHOL 241*  HDL 74  LDLCALC 137*  TRIG 166*  CHOLHDL 3.3   TSH: Recent Labs    07/30/22 0708  TSH 2.08   A1C: No results found for: "HGBA1C"  Assessment and Plan Assessment & Plan   1. Diarrhea, unspecified type (Primary) Intermittent constipation with alternating loose stools.   - Take stool softeners like Colace daily, except on days with loose stools. - Increase dietary fiber intake through vegetables and fiber supplements. - Avoid magnesium  supplements. - Monitor bowel movement patterns and adjust stool softener use accordingly.  2. Primary hypertension At goal Cont with losartan   Other orders - Turmeric (QC TUMERIC COMPLEX PO); Take 1 capsule by mouth 3 (three) times daily. - Ascorbic Acid (VITA-C PO); Take 1 capsule by mouth daily. - Fexofenadine HCl (ALLEGRA ALLERGY PO); Take 1 tablet by mouth daily. -  VITAMIN D , CHOLECALCIFEROL, PO; Take 1 capsule by mouth daily. - Omega-3 Fatty Acids (FISH OIL HIGH POTENCY PO); Take by mouth.      30 min Total time spent for obtaining history,  performing a medically  appropriate examination and evaluation, reviewing the tests,ordering  tests,  documenting clinical information in the electronic or other health record ,care coordination (not separately reported)

## 2023-05-12 DIAGNOSIS — R41841 Cognitive communication deficit: Secondary | ICD-10-CM | POA: Diagnosis not present

## 2023-05-12 DIAGNOSIS — R29898 Other symptoms and signs involving the musculoskeletal system: Secondary | ICD-10-CM | POA: Diagnosis not present

## 2023-05-12 DIAGNOSIS — M6281 Muscle weakness (generalized): Secondary | ICD-10-CM | POA: Diagnosis not present

## 2023-05-15 DIAGNOSIS — R29898 Other symptoms and signs involving the musculoskeletal system: Secondary | ICD-10-CM | POA: Diagnosis not present

## 2023-05-15 DIAGNOSIS — R41841 Cognitive communication deficit: Secondary | ICD-10-CM | POA: Diagnosis not present

## 2023-05-15 DIAGNOSIS — M6281 Muscle weakness (generalized): Secondary | ICD-10-CM | POA: Diagnosis not present

## 2023-05-19 DIAGNOSIS — R29898 Other symptoms and signs involving the musculoskeletal system: Secondary | ICD-10-CM | POA: Diagnosis not present

## 2023-05-19 DIAGNOSIS — M6281 Muscle weakness (generalized): Secondary | ICD-10-CM | POA: Diagnosis not present

## 2023-05-19 DIAGNOSIS — R41841 Cognitive communication deficit: Secondary | ICD-10-CM | POA: Diagnosis not present

## 2023-05-20 DIAGNOSIS — R41841 Cognitive communication deficit: Secondary | ICD-10-CM | POA: Diagnosis not present

## 2023-05-20 DIAGNOSIS — R29898 Other symptoms and signs involving the musculoskeletal system: Secondary | ICD-10-CM | POA: Diagnosis not present

## 2023-05-20 DIAGNOSIS — M6281 Muscle weakness (generalized): Secondary | ICD-10-CM | POA: Diagnosis not present

## 2023-05-21 DIAGNOSIS — M47812 Spondylosis without myelopathy or radiculopathy, cervical region: Secondary | ICD-10-CM | POA: Diagnosis not present

## 2023-05-27 DIAGNOSIS — R41841 Cognitive communication deficit: Secondary | ICD-10-CM | POA: Diagnosis not present

## 2023-05-27 DIAGNOSIS — R29898 Other symptoms and signs involving the musculoskeletal system: Secondary | ICD-10-CM | POA: Diagnosis not present

## 2023-05-27 DIAGNOSIS — M6281 Muscle weakness (generalized): Secondary | ICD-10-CM | POA: Diagnosis not present

## 2023-05-29 DIAGNOSIS — R29898 Other symptoms and signs involving the musculoskeletal system: Secondary | ICD-10-CM | POA: Diagnosis not present

## 2023-05-29 DIAGNOSIS — R41841 Cognitive communication deficit: Secondary | ICD-10-CM | POA: Diagnosis not present

## 2023-05-29 DIAGNOSIS — M6281 Muscle weakness (generalized): Secondary | ICD-10-CM | POA: Diagnosis not present

## 2023-06-03 DIAGNOSIS — M6281 Muscle weakness (generalized): Secondary | ICD-10-CM | POA: Diagnosis not present

## 2023-06-03 DIAGNOSIS — R41841 Cognitive communication deficit: Secondary | ICD-10-CM | POA: Diagnosis not present

## 2023-06-03 DIAGNOSIS — R29898 Other symptoms and signs involving the musculoskeletal system: Secondary | ICD-10-CM | POA: Diagnosis not present

## 2023-06-04 DIAGNOSIS — R29898 Other symptoms and signs involving the musculoskeletal system: Secondary | ICD-10-CM | POA: Diagnosis not present

## 2023-06-04 DIAGNOSIS — R41841 Cognitive communication deficit: Secondary | ICD-10-CM | POA: Diagnosis not present

## 2023-06-04 DIAGNOSIS — M6281 Muscle weakness (generalized): Secondary | ICD-10-CM | POA: Diagnosis not present

## 2023-06-05 DIAGNOSIS — M6281 Muscle weakness (generalized): Secondary | ICD-10-CM | POA: Diagnosis not present

## 2023-06-05 DIAGNOSIS — R29898 Other symptoms and signs involving the musculoskeletal system: Secondary | ICD-10-CM | POA: Diagnosis not present

## 2023-06-05 DIAGNOSIS — R41841 Cognitive communication deficit: Secondary | ICD-10-CM | POA: Diagnosis not present

## 2023-06-10 DIAGNOSIS — R41841 Cognitive communication deficit: Secondary | ICD-10-CM | POA: Diagnosis not present

## 2023-06-10 DIAGNOSIS — R29898 Other symptoms and signs involving the musculoskeletal system: Secondary | ICD-10-CM | POA: Diagnosis not present

## 2023-06-10 DIAGNOSIS — M6281 Muscle weakness (generalized): Secondary | ICD-10-CM | POA: Diagnosis not present

## 2023-06-11 DIAGNOSIS — M6281 Muscle weakness (generalized): Secondary | ICD-10-CM | POA: Diagnosis not present

## 2023-06-11 DIAGNOSIS — R29898 Other symptoms and signs involving the musculoskeletal system: Secondary | ICD-10-CM | POA: Diagnosis not present

## 2023-06-11 DIAGNOSIS — R41841 Cognitive communication deficit: Secondary | ICD-10-CM | POA: Diagnosis not present

## 2023-06-12 DIAGNOSIS — M6281 Muscle weakness (generalized): Secondary | ICD-10-CM | POA: Diagnosis not present

## 2023-06-12 DIAGNOSIS — R41841 Cognitive communication deficit: Secondary | ICD-10-CM | POA: Diagnosis not present

## 2023-06-12 DIAGNOSIS — R29898 Other symptoms and signs involving the musculoskeletal system: Secondary | ICD-10-CM | POA: Diagnosis not present

## 2023-06-13 DIAGNOSIS — H04123 Dry eye syndrome of bilateral lacrimal glands: Secondary | ICD-10-CM | POA: Diagnosis not present

## 2023-06-16 ENCOUNTER — Other Ambulatory Visit: Payer: Self-pay | Admitting: Family Medicine

## 2023-06-16 DIAGNOSIS — R29898 Other symptoms and signs involving the musculoskeletal system: Secondary | ICD-10-CM | POA: Diagnosis not present

## 2023-06-16 DIAGNOSIS — R41841 Cognitive communication deficit: Secondary | ICD-10-CM | POA: Diagnosis not present

## 2023-06-16 DIAGNOSIS — M6281 Muscle weakness (generalized): Secondary | ICD-10-CM | POA: Diagnosis not present

## 2023-06-17 DIAGNOSIS — R41841 Cognitive communication deficit: Secondary | ICD-10-CM | POA: Diagnosis not present

## 2023-06-17 DIAGNOSIS — R29898 Other symptoms and signs involving the musculoskeletal system: Secondary | ICD-10-CM | POA: Diagnosis not present

## 2023-06-17 DIAGNOSIS — M6281 Muscle weakness (generalized): Secondary | ICD-10-CM | POA: Diagnosis not present

## 2023-06-19 DIAGNOSIS — R29898 Other symptoms and signs involving the musculoskeletal system: Secondary | ICD-10-CM | POA: Diagnosis not present

## 2023-06-19 DIAGNOSIS — M6281 Muscle weakness (generalized): Secondary | ICD-10-CM | POA: Diagnosis not present

## 2023-06-19 DIAGNOSIS — R41841 Cognitive communication deficit: Secondary | ICD-10-CM | POA: Diagnosis not present

## 2023-06-20 DIAGNOSIS — R29898 Other symptoms and signs involving the musculoskeletal system: Secondary | ICD-10-CM | POA: Diagnosis not present

## 2023-06-20 DIAGNOSIS — M6281 Muscle weakness (generalized): Secondary | ICD-10-CM | POA: Diagnosis not present

## 2023-06-20 DIAGNOSIS — R41841 Cognitive communication deficit: Secondary | ICD-10-CM | POA: Diagnosis not present

## 2023-06-23 DIAGNOSIS — M6281 Muscle weakness (generalized): Secondary | ICD-10-CM | POA: Diagnosis not present

## 2023-06-23 DIAGNOSIS — R29898 Other symptoms and signs involving the musculoskeletal system: Secondary | ICD-10-CM | POA: Diagnosis not present

## 2023-06-23 DIAGNOSIS — R41841 Cognitive communication deficit: Secondary | ICD-10-CM | POA: Diagnosis not present

## 2023-06-25 ENCOUNTER — Encounter: Payer: Self-pay | Admitting: Adult Health

## 2023-06-27 DIAGNOSIS — R29898 Other symptoms and signs involving the musculoskeletal system: Secondary | ICD-10-CM | POA: Diagnosis not present

## 2023-06-27 DIAGNOSIS — R41841 Cognitive communication deficit: Secondary | ICD-10-CM | POA: Diagnosis not present

## 2023-06-27 DIAGNOSIS — M6281 Muscle weakness (generalized): Secondary | ICD-10-CM | POA: Diagnosis not present

## 2023-06-30 DIAGNOSIS — G245 Blepharospasm: Secondary | ICD-10-CM | POA: Diagnosis not present

## 2023-07-01 DIAGNOSIS — M6281 Muscle weakness (generalized): Secondary | ICD-10-CM | POA: Diagnosis not present

## 2023-07-01 DIAGNOSIS — R41841 Cognitive communication deficit: Secondary | ICD-10-CM | POA: Diagnosis not present

## 2023-07-01 DIAGNOSIS — R29898 Other symptoms and signs involving the musculoskeletal system: Secondary | ICD-10-CM | POA: Diagnosis not present

## 2023-07-03 DIAGNOSIS — R41841 Cognitive communication deficit: Secondary | ICD-10-CM | POA: Diagnosis not present

## 2023-07-03 DIAGNOSIS — M6281 Muscle weakness (generalized): Secondary | ICD-10-CM | POA: Diagnosis not present

## 2023-07-03 DIAGNOSIS — R29898 Other symptoms and signs involving the musculoskeletal system: Secondary | ICD-10-CM | POA: Diagnosis not present

## 2023-07-04 DIAGNOSIS — R29898 Other symptoms and signs involving the musculoskeletal system: Secondary | ICD-10-CM | POA: Diagnosis not present

## 2023-07-04 DIAGNOSIS — M6281 Muscle weakness (generalized): Secondary | ICD-10-CM | POA: Diagnosis not present

## 2023-07-04 DIAGNOSIS — R41841 Cognitive communication deficit: Secondary | ICD-10-CM | POA: Diagnosis not present

## 2023-07-07 ENCOUNTER — Telehealth: Payer: Self-pay | Admitting: Gastroenterology

## 2023-07-07 NOTE — Telephone Encounter (Signed)
 Attempted to reach the pt to discuss. Line rings no answer no voice mail

## 2023-07-07 NOTE — Telephone Encounter (Signed)
 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 States he provider recommended a colonoscopy. Patient is requesting a call to discuss further. Please advise, thank you

## 2023-07-08 DIAGNOSIS — R41841 Cognitive communication deficit: Secondary | ICD-10-CM | POA: Diagnosis not present

## 2023-07-08 DIAGNOSIS — M6281 Muscle weakness (generalized): Secondary | ICD-10-CM | POA: Diagnosis not present

## 2023-07-08 DIAGNOSIS — R29898 Other symptoms and signs involving the musculoskeletal system: Secondary | ICD-10-CM | POA: Diagnosis not present

## 2023-07-08 NOTE — Telephone Encounter (Signed)
 No answer no voice mail

## 2023-07-09 NOTE — Telephone Encounter (Signed)
 Left message on machine to call back - unable to reach pt by phone. Will await further calls from pt

## 2023-07-10 DIAGNOSIS — H04123 Dry eye syndrome of bilateral lacrimal glands: Secondary | ICD-10-CM | POA: Diagnosis not present

## 2023-07-10 DIAGNOSIS — R29898 Other symptoms and signs involving the musculoskeletal system: Secondary | ICD-10-CM | POA: Diagnosis not present

## 2023-07-10 DIAGNOSIS — R41841 Cognitive communication deficit: Secondary | ICD-10-CM | POA: Diagnosis not present

## 2023-07-10 DIAGNOSIS — M6281 Muscle weakness (generalized): Secondary | ICD-10-CM | POA: Diagnosis not present

## 2023-07-10 DIAGNOSIS — H0012 Chalazion right lower eyelid: Secondary | ICD-10-CM | POA: Diagnosis not present

## 2023-07-11 DIAGNOSIS — M6281 Muscle weakness (generalized): Secondary | ICD-10-CM | POA: Diagnosis not present

## 2023-07-11 DIAGNOSIS — R41841 Cognitive communication deficit: Secondary | ICD-10-CM | POA: Diagnosis not present

## 2023-07-11 DIAGNOSIS — R29898 Other symptoms and signs involving the musculoskeletal system: Secondary | ICD-10-CM | POA: Diagnosis not present

## 2023-07-14 DIAGNOSIS — M6281 Muscle weakness (generalized): Secondary | ICD-10-CM | POA: Diagnosis not present

## 2023-07-14 DIAGNOSIS — R41841 Cognitive communication deficit: Secondary | ICD-10-CM | POA: Diagnosis not present

## 2023-07-14 DIAGNOSIS — R29898 Other symptoms and signs involving the musculoskeletal system: Secondary | ICD-10-CM | POA: Diagnosis not present

## 2023-07-16 DIAGNOSIS — M6281 Muscle weakness (generalized): Secondary | ICD-10-CM | POA: Diagnosis not present

## 2023-07-16 DIAGNOSIS — R29898 Other symptoms and signs involving the musculoskeletal system: Secondary | ICD-10-CM | POA: Diagnosis not present

## 2023-07-28 DIAGNOSIS — H04123 Dry eye syndrome of bilateral lacrimal glands: Secondary | ICD-10-CM | POA: Diagnosis not present

## 2023-07-28 DIAGNOSIS — H401131 Primary open-angle glaucoma, bilateral, mild stage: Secondary | ICD-10-CM | POA: Diagnosis not present

## 2023-07-29 ENCOUNTER — Other Ambulatory Visit: Payer: Medicare PPO

## 2023-07-29 DIAGNOSIS — G243 Spasmodic torticollis: Secondary | ICD-10-CM | POA: Diagnosis not present

## 2023-07-29 LAB — CBC WITH DIFFERENTIAL/PLATELET
Absolute Lymphocytes: 1273 {cells}/uL (ref 850–3900)
Absolute Monocytes: 401 {cells}/uL (ref 200–950)
Basophils Absolute: 62 {cells}/uL (ref 0–200)
Basophils Relative: 0.7 %
Eosinophils Absolute: 142 {cells}/uL (ref 15–500)
Eosinophils Relative: 1.6 %
HCT: 38.8 % (ref 35.0–45.0)
Hemoglobin: 12.6 g/dL (ref 11.7–15.5)
MCH: 32.9 pg (ref 27.0–33.0)
MCHC: 32.5 g/dL (ref 32.0–36.0)
MCV: 101.3 fL — ABNORMAL HIGH (ref 80.0–100.0)
MPV: 9.9 fL (ref 7.5–12.5)
Monocytes Relative: 4.5 %
Neutro Abs: 7022 {cells}/uL (ref 1500–7800)
Neutrophils Relative %: 78.9 %
Platelets: 294 Thousand/uL (ref 140–400)
RBC: 3.83 Million/uL (ref 3.80–5.10)
RDW: 11.9 % (ref 11.0–15.0)
Total Lymphocyte: 14.3 %
WBC: 8.9 Thousand/uL (ref 3.8–10.8)

## 2023-07-29 LAB — TSH: TSH: 1.56 m[IU]/L (ref 0.40–4.50)

## 2023-07-29 LAB — LIPID PANEL
Cholesterol: 212 mg/dL — ABNORMAL HIGH (ref <200)
HDL: 62 mg/dL (ref >=50)
LDL Cholesterol (Calc): 121 mg/dL — ABNORMAL HIGH
Non-HDL Cholesterol (Calc): 150 mg/dL — ABNORMAL HIGH (ref <130)
Total CHOL/HDL Ratio: 3.4 (calc) (ref <5.0)
Triglycerides: 173 mg/dL — ABNORMAL HIGH (ref <150)

## 2023-07-31 ENCOUNTER — Non-Acute Institutional Stay: Payer: Medicare PPO | Admitting: Nurse Practitioner

## 2023-07-31 ENCOUNTER — Encounter: Payer: Self-pay | Admitting: Nurse Practitioner

## 2023-07-31 VITALS — BP 118/70 | HR 54 | Temp 98.2°F | Ht 61.0 in | Wt 115.0 lb

## 2023-07-31 DIAGNOSIS — E785 Hyperlipidemia, unspecified: Secondary | ICD-10-CM

## 2023-07-31 DIAGNOSIS — M150 Primary generalized (osteo)arthritis: Secondary | ICD-10-CM | POA: Diagnosis not present

## 2023-07-31 DIAGNOSIS — K219 Gastro-esophageal reflux disease without esophagitis: Secondary | ICD-10-CM | POA: Diagnosis not present

## 2023-07-31 DIAGNOSIS — R001 Bradycardia, unspecified: Secondary | ICD-10-CM

## 2023-07-31 DIAGNOSIS — K5901 Slow transit constipation: Secondary | ICD-10-CM

## 2023-07-31 DIAGNOSIS — G243 Spasmodic torticollis: Secondary | ICD-10-CM | POA: Diagnosis not present

## 2023-07-31 DIAGNOSIS — I10 Essential (primary) hypertension: Secondary | ICD-10-CM | POA: Diagnosis not present

## 2023-07-31 DIAGNOSIS — R4189 Other symptoms and signs involving cognitive functions and awareness: Secondary | ICD-10-CM

## 2023-07-31 DIAGNOSIS — F304 Manic episode in full remission: Secondary | ICD-10-CM

## 2023-07-31 MED ORDER — ALPRAZOLAM 0.5 MG PO TABS: 0.5000 mg | ORAL_TABLET | Freq: Three times a day (TID) | ORAL | 1 refills | Status: DC | PRN

## 2023-07-31 NOTE — Assessment & Plan Note (Signed)
improved from 40s to 50s. Hx of normalized HR after Tylenol PM dc'd, GDR of Lithium helped too. 

## 2023-07-31 NOTE — Progress Notes (Signed)
 Location:   [facility]   Place of Service:  Clinic (12) Provider: Larwance Starlena Beil NP  Gillermo Poch X, NP  Patient Care Team: Alizon Schmeling X, NP as PCP - General (Internal Medicine) Devonna Sieving, MD as Consulting Physician (Gynecologic Oncology) Cloretta Arley NOVAK, MD as Consulting Physician (Oncology) Luis Purchase, MD as Consulting Physician (Gastroenterology) Octavia Charleston, MD as Consulting Physician (Ophthalmology) Cleotilde Fairy POUR, MD as Referring Physician (Neurology) Tat, Asberry RAMAN, DO as Consulting Physician (Neurology)  Extended Emergency Contact Information Primary Emergency Contact: Corlis Sieving Mulders Address: [address]          [place], [place] [address] [place] Home Phone: [phone] Mobile Phone: [phone] Relation: Son Secondary Emergency Contact: Corlis Comer Numbers Home Phone: [phone] Mobile Phone: [phone] Relation: Relative  Code Status:  DNR Goals of care: Advanced Directive information    01/23/2023    3:15 PM  Advanced Directives  Does Patient Have a Medical Advance Directive? Yes  Type of Advance Directive Healthcare Power of Attorney  Does patient want to make changes to medical advance directive? No - Patient declined  Copy of Healthcare Power of Attorney in Chart? Yes - validated most recent copy scanned in chart (See row information)     Chief Complaint  Patient presents with   Medical Management of Chronic Issues    6 month follow-up and discuss labs (copy given to patient). Discussed need for annual wellness visit. Patient would like to discuss anxiety medication and right knee injury (3 months ago)    HPI:  Pt is a 83 y.o. female seen today for medical management of chronic diseases.     Dry mouth syndrome, resolved after dentist recommend mouth wash             Persisted running nose, facial pressure, sore throat, underwent ENT evaluation              Esophageal candidiasis, recurrent thrush identified  on swallow study, FEES showed diffused white secretion coating areas of white plaques throughout pharynx. The patient c/o sore in buccal sides of tongue and in her throat when swallows. Treated with Diflucan  in the past, underwent GI evaluation, EGD 03/25/22 normal findings. Last treated with Diflucan  300mg  x1 04/10/22, then followed 200mg  qd x1 wk, may repeat x1.                Edema, LLE negative DVT venous US , L>R. Medial thickened swelling area about her palm sized, mild warmth, redness, and indurated area for a few month. Varicose veins LLE not new.  Tinnitus, underwent eval of  ENT, suggested music background.              Hx of GIST of small intestine s/p resection. MiraLax , Psyllium              Dysphagia, GI evaluated, EGD 03/25/22 wnl, working with ST, thicken liquids.              Constipation, Metamucil, MiraLax ,  effective.              GERD, stable, on Omeprazole              HTN, blood pressure is controlled on Losartan              Bipolar disorder, stable, on Lithium , GDR due to bradycardia, takes Alprazolam  too, TSH 2.08 07/30/22. Declined Dr Cleotilde started low dose of Depakote . Lithium  level 0.4 07/30/22             Bradycardia,  improved from 40s to 50s. Hx of normalized HR after Tylenol  PM dc'd, GDR of Lithium  helped too.              Hypercalcemia, f/u endocrinology, Ca 10.3 04/18/23,  PTH 79 01/12/20. GDR of Lithium  may help             OA, takes  Tylenol , s/p R knee arthroplasty.             Cervical degenerative changes/dystonia, better, CT/MRI 12/2019 ruled out acute process, aches in neck R>L travels to the back of head, comes and goes, not disabling, f/u Neurosurgery. Failed Cymbalta , Gabapentin , Robaxin , Ultracet . s/p Botulinum Toxin inj by Neurology. ESR 9, CRP 2.7 10/09/20(ESR 6 07/30/22). The patient stated Alprazolam  and Tylenol  are kind of effective. Improved on Topamax, but associated with weight loss.  No Parkinson's disease, took her off Sinemet  per Neurology. Currently taking  inj               Cognitive impairment: 12/2019 MRI brain showed chronic microvascular ischemic changes, no acute disease with mild cortical atrophy, Vit B12 466 07/30/22, TSH 1.56 07/29/23             Hyperlipidemia, LDL 121 07/29/23, on diet, tolerated Pravastatin      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, possible    Tremors possible parkinsons   Manic disorder, single episode, in full remission (HCC) 12/01/2009   Sixth nerve palsy    Past Surgical History:  Procedure Laterality Date   BILATERAL SALPINGOOPHORECTOMY  09/22/2007   CATARACT EXTRACTION Bilateral    ESOPHAGOGASTRODUODENOSCOPY (EGD) WITH PROPOFOL  N/A 03/25/2022   Procedure: ESOPHAGOGASTRODUODENOSCOPY (EGD) WITH PROPOFOL ;  Surgeon: Legrand Victory LITTIE DOUGLAS, MD;  Location: [facility];  Service: Gastroenterology;  Laterality: N/A;   Gastrointestinal Stroma Tumor,  Other  1960   GIST Surgery   ILEOCECETOMY  09/22/2007   OVARIAN CYST REMOVAL Right 1967   SMALL INTESTINE SURGERY  09/22/2007   TOTAL KNEE ARTHROPLASTY Right 10/05/2019   Procedure: RIGHT TOTAL KNEE ARTHROPLASTY;  Surgeon: Addie Cordella Hamilton, MD;  [facility];  Service: Orthopedics;  Laterality: Right;   TOTAL VAGINAL HYSTERECTOMY  1986   Fibroids    Allergies  Allergen Reactions   Lisinopril Cough   Penicillins Itching    50 years ago   Adhesive [Tape] Itching   Atorvastatin Itching   Dilaudid  [Hydromorphone   Hcl] Itching   Hydromorphone  Itching    Allergies as of 07/31/2023       Reactions   Lisinopril Cough   Penicillins Itching   50 years ago   Adhesive [tape] Itching   Atorvastatin Itching   Dilaudid  [hydromorphone  Hcl] Itching   Hydromorphone  Itching        Medication List        Accurate as of July 31, 2023  4:06 PM. If you have any questions, ask your nurse or  doctor.          ALLEGRA ALLERGY PO Take 1 tablet by mouth daily.   ALPRAZolam  0.5 MG tablet Commonly known as: XANAX  Take 1 tablet (0.5 mg total) by mouth 3 (three) times daily as needed for anxiety. What changed:  when to take this reasons to take this additional instructions Changed by: Tasha Jindra X Tahirih Lair   aspirin  81 MG chewable tablet Chew 81 mg by mouth daily.   Biotene Dry Mouth Lozg Use as directed 1 tablet in the mouth or throat 4 (four) times daily as needed.   bismuth subsalicylate 262 MG/15ML suspension Commonly known as: PEPTO BISMOL Take 30 mLs by mouth every 6 (six) hours as needed for diarrhea or loose stools.   clindamycin  150 MG capsule Commonly known as: CLEOCIN  Take 600mg  1 hour prior to dental procedure   DULoxetine  20 MG capsule Commonly known as: Cymbalta  Take 1 capsule (20 mg total) by mouth daily.   FISH OIL HIGH POTENCY PO Take by mouth.   latanoprost  0.005 % ophthalmic solution Commonly known as: XALATAN  Place 1 drop into both eyes at bedtime.   lithium  carbonate 150 MG capsule TAKE ONE CAPSULE BY MOUTH TWICE DAILY WITH MEALS   losartan  50 MG tablet Commonly known as: COZAAR  TAKE ONE TABLET BY MOUTH ONCE DAILY   mirabegron  ER 25 MG Tb24 tablet Commonly known as: Myrbetriq  Take 1 tablet (25 mg total) by mouth daily.   multivitamin tablet Take 1 tablet by mouth daily.   omeprazole  20 MG capsule Commonly known as: PRILOSEC Take 1 capsule (20 mg total) by mouth daily.   polyethylene glycol 17 g packet Commonly known as: MIRALAX  / GLYCOLAX  Take 17 g by mouth  daily as needed.   pravastatin  10 MG tablet Commonly known as: PRAVACHOL  Take 1 tablet (10 mg total) by mouth daily.   QC TUMERIC COMPLEX PO Take 1 capsule by mouth 3 (three) times daily.   Restasis 0.05 % ophthalmic emulsion Generic drug: cycloSPORINE Place 1 drop into both eyes 2 (two) times daily.   Systane 0.4-0.3 % Soln Generic drug: Polyethyl Glycol-Propyl Glycol Apply 1 drop to eye as needed (Both eyes).   topiramate 25 MG tablet Commonly known as: TOPAMAX TAKE ONE TABLET BY MOUTH TWICE DAILY IN THE MORNING AND IN THE EVENING   triamcinolone  cream 0.5 % Commonly known as: KENALOG  APPLY TO THE AFFECTED AREA(S) TOPICALLY TWICE DAILY   VITA-C PO Take 1 capsule by mouth daily.   VITAMIN D  (CHOLECALCIFEROL) PO Take 1 capsule by mouth daily.        Review of Systems  Constitutional:  Negative for appetite change, fatigue and fever.  HENT:  Positive for hearing loss and rhinorrhea. Negative for congestion.   Eyes:  Negative for visual disturbance.       Glaucoma, diplopia R+L lateral peripheral visual fields only.   Respiratory:  Negative for shortness of breath.        Occasionally DOE, hacking cough  Cardiovascular:  Positive for leg swelling.  Gastrointestinal:  Negative for abdominal pain and constipation.  Genitourinary:  Positive for frequency. Negative for dysuria and urgency.       Couple of times at night, no difficulty of returning asleep.   Musculoskeletal:  Positive for arthralgias and gait problem.       Walker.   Skin:  Negative for color change.  Neurological:  Negative for tremors, speech difficulty, light-headedness and headaches.       Comes and goes nature of the  neck/occipital pain. Stiff neck. Pain the patent right knee, s/p TKR. Tremor is better since Sinemet .   Psychiatric/Behavioral:  Positive for sleep disturbance. Negative for confusion. The patient is nervous/anxious.        Better mood, feels not as anxious or depressive mood as prior.      Immunization History  Administered Date(s) Administered   Fluad Quad(high Dose 65+) 11/03/2017, 10/30/2020   Influenza Split 10/24/2008, 09/26/2009, 10/08/2011, 10/05/2012   Influenza, High Dose Seasonal PF 11/02/2013, 11/02/2013, 11/03/2017, 10/26/2018, 10/27/2019, 11/13/2022   Influenza, Quadrivalent, Recombinant, Inj, Pf 10/11/2016   Influenza,inj,Quad PF,6+ Mos 11/01/2014, 10/11/2015, 10/11/2016   Influenza-Unspecified 10/24/2008, 09/26/2009, 10/08/2011, 10/05/2012, 11/02/2013, 10/11/2016, 11/05/2021   Moderna Covid-19 Vaccine Bivalent Booster 62yrs & up 11/13/2022   Moderna SARS-COV2 Booster Vaccination 06/13/2020   Moderna Sars-Covid-2 Vaccination 01/18/2019, 02/15/2019, 11/23/2019   Pfizer Covid-19 Vaccine Bivalent Booster 54yrs & up 11/15/2021   Pneumococcal Conjugate-13 08/20/2011   Pneumococcal Polysaccharide-23 01/14/2005, 06/29/2014   Pneumococcal-Unspecified 01/14/2005   Tdap 08/20/2011, 04/18/2023   Zoster Recombinant(Shingrix) 11/09/2018, 12/01/2018, 03/01/2019   Zoster, Live 10/08/2011   Pertinent  Health Maintenance Due  Topic Date Due   INFLUENZA VACCINE  08/15/2023   DEXA SCAN  Completed      10/17/2022    2:30 PM 01/23/2023    3:14 PM 04/18/2023    8:59 AM 04/18/2023    2:40 PM 07/31/2023    1:01 PM  Fall Risk  Falls in the past year? 0 0 0 0 1  Was there an injury with Fall? 0 0 0 0 0  Was there an injury with Fall? - Comments     hit right knee  Fall Risk Category Calculator 0 0 0 0 1  Patient at Risk for Falls Due to No Fall Risks  No Fall Risks No Fall Risks No Fall Risks  Fall risk Follow up Falls evaluation completed  Falls evaluation completed Falls evaluation completed Falls evaluation completed   Functional Status Survey:    Vitals:   07/31/23 0925  BP: 118/70  Pulse: (!) 54  Temp: 98.2 F (36.8 C)  SpO2: 99%  Weight: 115 lb (52.2 kg)  Height: 5' 1 (1.549 m)   Body mass index is 21.73 kg/m. Physical Exam Vitals and nursing note  reviewed.  Constitutional:      Appearance: Normal appearance.  HENT:     Head: Normocephalic and atraumatic.     Nose: Nose normal.     Mouth/Throat:     Mouth: Mucous membranes are moist.  Eyes:     Extraocular Movements: Extraocular movements intact.     Conjunctiva/sclera: Conjunctivae normal.     Pupils: Pupils are equal, round, and reactive to light.  Cardiovascular:     Rate and Rhythm: Normal rate and regular rhythm.     Heart sounds: Murmur heard.     Comments: DP pulses present R+L. HR in 50s Pulmonary:     Effort: Pulmonary effort is normal.     Breath sounds: No rales.  Abdominal:     General: Bowel sounds are normal.     Palpations: Abdomen is soft.     Tenderness: There is no abdominal tenderness.  Musculoskeletal:     Cervical back: Normal range of motion and neck supple.     Right lower leg: Edema present.     Left lower leg: Edema present.     Comments: Chronic R knee pain is improved. S/p TKR right. Felt lateral right thigh muscle is tight.  Stiff neck,  comes and goes neck/occipital pain. Trace edema BLE. Arthritic joint change fingers.   Skin:    General: Skin is warm and dry.     Comments: Improved the medial lower left leg a palm sized thickened, slightly reddened area, mild discomfort when palpated. DP present left foot. Scattered varicose veins left leg 05/09/22 medical left MTJ callous, plantar aspect 3rd toe callous, the left 2nd toe pain after injury, but no bruise, deformity, swelling, redness, able to PROM w/o pain.    Neurological:     General: No focal deficit present.     Mental Status: She is alert and oriented to person, place, and time. Mental status is at baseline.     Motor: No weakness.     Coordination: Coordination abnormal.     Gait: Gait abnormal.     Comments: Resting tremor in fingers, near resolution.   Psychiatric:        Mood and Affect: Mood normal.        Behavior: Behavior normal.        Thought Content: Thought content  normal.     Labs reviewed: Recent Labs    04/18/23 1524  NA 140  K 4.1  CL 113*  CO2 23  GLUCOSE 96  BUN 15  CREATININE 0.79  CALCIUM 10.3  MG 2.1   Recent Labs    04/18/23 1524  AST 18  ALT 12  BILITOT 0.5  PROT 6.5   Recent Labs    04/18/23 1524 07/29/23 0711  WBC 8.0 8.9  NEUTROABS 5,720 7,022  HGB 12.2 12.6  HCT 37.0 38.8  MCV 97.1 101.3*  PLT 277 294   Lab Results  Component Value Date   TSH 1.56 07/29/2023   No results found for: HGBA1C Lab Results  Component Value Date   CHOL 212 (H) 07/29/2023   HDL 62 07/29/2023   LDLCALC 121 (H) 07/29/2023   TRIG 173 (H) 07/29/2023   CHOLHDL 3.4 07/29/2023    Significant Diagnostic Results in last 30 days:  No results found.  Assessment/Plan  HLD (hyperlipidemia) LDL 121 07/29/23, on diet, tolerated Pravastatin     Cognitive impairment  12/2019 MRI brain showed chronic microvascular ischemic changes, no acute disease with mild cortical atrophy, Vit B12 466 07/30/22, TSH 1.56 07/29/23  Cervical dystonia Cervical degenerative changes/dystonia, better, CT/MRI 12/2019 ruled out acute process, aches in neck R>L travels to the back of head, comes and goes, not disabling, f/u Neurosurgery. Failed Cymbalta , Gabapentin , Robaxin , Ultracet . s/p Botulinum Toxin inj by Neurology. ESR 9, CRP 2.7 10/09/20(ESR 6 07/30/22). The patient stated Alprazolam  and Tylenol  are kind of effective. Improved on Topamax, but associated with weight loss.  No Parkinson's disease, took her off Sinemet  per Neurology. Currently taking inj    Arthritis, degenerative  takes  Tylenol , s/p R knee arthroplasty.   Hypercalcemia  f/u endocrinology, Ca 10.3 04/18/23,  PTH 79 01/12/20. GDR of Lithium  may help  Bradycardia  improved from 40s to 50s. Hx of normalized HR after Tylenol  PM dc'd, GDR of Lithium  helped too.   Bipolar I disorder, single manic episode, in full remission (HCC)  stable, on Lithium , GDR due to bradycardia, takes Alprazolam   too, TSH 2.08 07/30/22. Declined Dr Cleotilde started low dose of Depakote . Lithium  level 0.4 07/30/22 Escalated anxiety due to her family issue, wants to try Alprazolam  tid prn/bid prn, aware of R vs B F/u 2-3 weeks.   Essential (primary) hypertension blood pressure is controlled on Losartan   GERD (gastroesophageal reflux disease) stable,  on Omeprazole   Slow transit constipation Stable,  Metamucil, MiraLax ,  effective.    Family/ staff Communication: plan of care reviewed with the patient  Labs/tests ordered:  none  F/u 2-3 weeks.

## 2023-07-31 NOTE — Assessment & Plan Note (Signed)
 LDL 121 07/29/23, on diet, tolerated Pravastatin 

## 2023-07-31 NOTE — Assessment & Plan Note (Signed)
 Cervical degenerative changes/dystonia, better, CT/MRI 12/2019 ruled out acute process, aches in neck R>L travels to the back of head, comes and goes, not disabling, f/u Neurosurgery. Failed Cymbalta , Gabapentin , Robaxin , Ultracet . s/p Botulinum Toxin inj by Neurology. ESR 9, CRP 2.7 10/09/20(ESR 6 07/30/22). The patient stated Alprazolam  and Tylenol  are kind of effective. Improved on Topamax, but associated with weight loss.  No Parkinson's disease, took her off Sinemet  per Neurology. Currently taking inj

## 2023-07-31 NOTE — Assessment & Plan Note (Signed)
 f/u endocrinology, Ca 10.3 04/18/23,  PTH 79 01/12/20. GDR of Lithium  may help

## 2023-07-31 NOTE — Assessment & Plan Note (Signed)
 12/2019 MRI brain showed chronic microvascular ischemic changes, no acute disease with mild cortical atrophy, Vit B12 466 07/30/22, TSH 1.56 07/29/23

## 2023-07-31 NOTE — Assessment & Plan Note (Addendum)
 stable, on Lithium , GDR due to bradycardia, takes Alprazolam  too, TSH 2.08 07/30/22. Declined Dr Cleotilde started low dose of Depakote . Lithium  level 0.4 07/30/22 Escalated anxiety due to her family issue, wants to try Alprazolam  tid prn/bid prn, aware of R vs B F/u 2-3 weeks.

## 2023-07-31 NOTE — Assessment & Plan Note (Signed)
Stable, Metamucil, MiraLax,  effective.

## 2023-07-31 NOTE — Assessment & Plan Note (Signed)
takes  Tylenol, s/p R knee arthroplasty.  

## 2023-07-31 NOTE — Assessment & Plan Note (Signed)
stable, on Omeprazole.  

## 2023-07-31 NOTE — Patient Instructions (Signed)
 1.) Number to [facility] [phone]  2.) Return in 2 week to follow-up on anxiety

## 2023-07-31 NOTE — Assessment & Plan Note (Signed)
blood pressure is controlled on Losartan 

## 2023-08-06 ENCOUNTER — Emergency Department (HOSPITAL_COMMUNITY)
Admission: EM | Admit: 2023-08-06 | Discharge: 2023-08-06 | Disposition: A | Discharge status: Skilled Nursing Facility | Attending: Emergency Medicine | Admitting: Emergency Medicine

## 2023-08-06 ENCOUNTER — Emergency Department (HOSPITAL_COMMUNITY)

## 2023-08-06 DIAGNOSIS — Z79899 Other long term (current) drug therapy: Secondary | ICD-10-CM | POA: Diagnosis not present

## 2023-08-06 DIAGNOSIS — Z85038 Personal history of other malignant neoplasm of large intestine: Secondary | ICD-10-CM | POA: Insufficient documentation

## 2023-08-06 DIAGNOSIS — D72829 Elevated white blood cell count, unspecified: Secondary | ICD-10-CM | POA: Insufficient documentation

## 2023-08-06 DIAGNOSIS — R0789 Other chest pain: Secondary | ICD-10-CM | POA: Diagnosis not present

## 2023-08-06 DIAGNOSIS — R079 Chest pain, unspecified: Secondary | ICD-10-CM | POA: Diagnosis not present

## 2023-08-06 DIAGNOSIS — Z7982 Long term (current) use of aspirin: Secondary | ICD-10-CM | POA: Diagnosis not present

## 2023-08-06 DIAGNOSIS — Z8501 Personal history of malignant neoplasm of esophagus: Secondary | ICD-10-CM | POA: Insufficient documentation

## 2023-08-06 DIAGNOSIS — I959 Hypotension, unspecified: Secondary | ICD-10-CM | POA: Diagnosis not present

## 2023-08-06 DIAGNOSIS — R059 Cough, unspecified: Secondary | ICD-10-CM | POA: Diagnosis not present

## 2023-08-06 DIAGNOSIS — R0600 Dyspnea, unspecified: Secondary | ICD-10-CM | POA: Insufficient documentation

## 2023-08-06 DIAGNOSIS — R0602 Shortness of breath: Secondary | ICD-10-CM | POA: Diagnosis not present

## 2023-08-06 DIAGNOSIS — I10 Essential (primary) hypertension: Secondary | ICD-10-CM | POA: Insufficient documentation

## 2023-08-06 LAB — CBC WITH DIFFERENTIAL/PLATELET
Abs Immature Granulocytes: 0.04 K/uL (ref 0.00–0.07)
Basophils Absolute: 0 K/uL (ref 0.0–0.1)
Basophils Relative: 0 %
Eosinophils Absolute: 0.1 K/uL (ref 0.0–0.5)
Eosinophils Relative: 1 %
HCT: 34 % — ABNORMAL LOW (ref 36.0–46.0)
Hemoglobin: 10.6 g/dL — ABNORMAL LOW (ref 12.0–15.0)
Immature Granulocytes: 0 %
Lymphocytes Relative: 14 %
Lymphs Abs: 1.6 K/uL (ref 0.7–4.0)
MCH: 32 pg (ref 26.0–34.0)
MCHC: 31.2 g/dL (ref 30.0–36.0)
MCV: 102.7 fL — ABNORMAL HIGH (ref 80.0–100.0)
Monocytes Absolute: 1 K/uL (ref 0.1–1.0)
Monocytes Relative: 9 %
Neutro Abs: 8.4 K/uL — ABNORMAL HIGH (ref 1.7–7.7)
Neutrophils Relative %: 76 %
Platelets: 382 K/uL (ref 150–400)
RBC: 3.31 MIL/uL — ABNORMAL LOW (ref 3.87–5.11)
RDW: 12.4 % (ref 11.5–15.5)
WBC: 11.1 K/uL — ABNORMAL HIGH (ref 4.0–10.5)
nRBC: 0 % (ref 0.0–0.2)

## 2023-08-06 LAB — COMPREHENSIVE METABOLIC PANEL WITH GFR
ALT: 27 U/L (ref 0–44)
AST: 32 U/L (ref 15–41)
Albumin: 3.5 g/dL (ref 3.5–5.0)
Alkaline Phosphatase: 77 U/L (ref 38–126)
Anion gap: 8 (ref 5–15)
BUN: 21 mg/dL (ref 8–23)
CO2: 20 mmol/L — ABNORMAL LOW (ref 22–32)
Calcium: 10.6 mg/dL — ABNORMAL HIGH (ref 8.9–10.3)
Chloride: 107 mmol/L (ref 98–111)
Creatinine, Ser: 0.89 mg/dL (ref 0.44–1.00)
GFR, Estimated: >60 mL/min (ref >60)
Glucose, Bld: 109 mg/dL — ABNORMAL HIGH (ref 70–99)
Potassium: 4.1 mmol/L (ref 3.5–5.1)
Sodium: 135 mmol/L (ref 135–145)
Total Bilirubin: 0.5 mg/dL (ref 0.0–1.2)
Total Protein: 7.4 g/dL (ref 6.5–8.1)

## 2023-08-06 LAB — TROPONIN I (HIGH SENSITIVITY): Troponin I (High Sensitivity): 5 ng/L (ref <18)

## 2023-08-06 LAB — BRAIN NATRIURETIC PEPTIDE: B Natriuretic Peptide: 67.4 pg/mL (ref 0.0–100.0)

## 2023-08-06 NOTE — ED Provider Notes (Signed)
  EMERGENCY DEPARTMENT AT [facility] Provider Note   CSN: [id number] Arrival date & time: 08/06/23  1524     Patient presents with: Cough   Tracey [name] is a 83 y.o. female.    Cough Patient has a cough trouble breathing.  Has had cough and trouble breathing for years but worse since yesterday.  No sputum production.  No fevers.  Did have some shortness of breath at times.  Also had recent stop of her Xanax .  Had been on it for 40 years but reportedly stopped because some techs at the nursing home said she should not have it.  Patient also been nervous about possibility of a pacemaker since she had some slow heart rate.      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, possible    Tremors possible parkinsons   Manic disorder, single episode, in full remission (HCC) 12/01/2009   Sixth nerve palsy     Prior to Admission medications   Medication Sig Start Date End Date Taking? Authorizing Provider  ALPRAZolam  (XANAX ) 0.5 MG tablet Take 1 tablet (0.5 mg total) by mouth 3 (three) times daily as needed for anxiety. 07/31/23   Mast, Man X, NP  Artificial Saliva (BIOTENE DRY MOUTH) LOZG Use as directed 1 tablet in the mouth or throat 4 (four) times daily as needed. 12/19/21   Cleotilde Garnette HERO, MD  Ascorbic Acid (VITA-C PO) Take 1 capsule by mouth daily.    [provider]  aspirin  81 MG chewable tablet Chew  81 mg by mouth daily.    [provider]  bismuth subsalicylate (PEPTO BISMOL) 262 MG/15ML suspension Take 30 mLs by mouth every 6 (six) hours as needed for diarrhea or loose stools.    [provider]  clindamycin  (CLEOCIN ) 150 MG capsule Take 600mg  1 hour prior to dental procedure 03/09/20   Addie Cordella Hamilton, MD  DULoxetine  (CYMBALTA ) 20 MG capsule Take 1 capsule (20 mg total) by mouth daily. Patient not taking: Reported on 07/31/2023 10/17/22 01/15/48  Mast, Man X, NP  Fexofenadine HCl (ALLEGRA ALLERGY PO) Take 1 tablet by mouth daily.    [provider]  latanoprost  (XALATAN ) 0.005 % ophthalmic solution Place 1 drop into both eyes at bedtime.    [provider]  lithium  carbonate 150 MG capsule TAKE ONE CAPSULE BY MOUTH TWICE DAILY WITH MEALS 05/06/23   Mast, Man X, NP  losartan  (COZAAR ) 50 MG tablet TAKE ONE TABLET BY MOUTH ONCE DAILY 10/28/22   Sherlynn Madden, MD  mirabegron  ER (MYRBETRIQ ) 25 MG TB24 tablet Take 1 tablet (25 mg total) by mouth daily. 09/19/22   Mast, Man X, NP  Multiple Vitamin (MULTIVITAMIN) tablet Take 1 tablet by mouth daily.    [provider]  Omega-3 Fatty Acids (FISH OIL HIGH POTENCY PO) Take by mouth.    [provider]  omeprazole  (PRILOSEC) 20 MG capsule Take 1 capsule (20 mg total) by mouth daily. 07/11/22   Mast, Man X, NP  Polyethyl Glycol-Propyl Glycol (SYSTANE) 0.4-0.3 % SOLN Apply 1 drop to eye as needed (Both eyes).    [provider]  polyethylene glycol (MIRALAX  / GLYCOLAX ) 17 g packet Take 17 g by mouth daily as needed. 04/18/23   Medina-Vargas, Monina C, NP  pravastatin  (PRAVACHOL ) 10 MG tablet Take 1 tablet (10 mg total) by mouth daily. 04/07/23   Fargo, Amy E, NP  RESTASIS 0.05 % ophthalmic emulsion Place 1 drop into both eyes 2 (two) times daily. Patient not taking: Reported on 07/31/2023 03/11/22   [provider]  topiramate (TOPAMAX) 25 MG tablet TAKE ONE TABLET BY MOUTH TWICE DAILY  IN THE MORNING AND IN THE EVENING 06/16/23   Mast, Man X, NP  triamcinolone  cream (KENALOG ) 0.5 % APPLY TO THE AFFECTED AREA(S) TOPICALLY TWICE DAILY 12/27/22   Sherlynn Madden, MD  Turmeric (QC TUMERIC COMPLEX PO) Take 1 capsule by mouth 3 (three) times daily.    [provider]  VITAMIN D , CHOLECALCIFEROL, PO Take 1 capsule by mouth daily.    [provider]    Allergies: Lisinopril, Penicillins, Adhesive [tape], Atorvastatin, Dilaudid  [hydromorphone  hcl], and Hydromorphone     Review of Systems  Respiratory:  Positive for cough.     Updated Vital Signs BP 121/61   Pulse 67   Temp 98.4 F (36.9 C) (Oral)   Resp (!) 22   Ht 5' 2 (1.575 m)   Wt 54.4 kg   SpO2 100%   BMI 21.95 kg/m   Physical Exam Vitals and nursing note reviewed.  Cardiovascular:     Rate and Rhythm: Regular rhythm.  Pulmonary:     Breath sounds: No wheezing.  Abdominal:     Tenderness: There is no abdominal tenderness.  Musculoskeletal:        General: No tenderness.  Skin:    General: Skin is warm.     Capillary Refill: Capillary refill takes less than 2 seconds.  Neurological:     Mental Status: She is alert. Mental status is at baseline.     (all labs ordered are listed, but only abnormal results are displayed) Labs Reviewed  CBC WITH DIFFERENTIAL/PLATELET - Abnormal; Notable for the following components:      Result Value   WBC 11.1 (*)    RBC 3.31 (*)    Hemoglobin 10.6 (*)    HCT 34.0 (*)    MCV 102.7 (*)    Neutro Abs 8.4 (*)    All other components within normal limits  COMPREHENSIVE METABOLIC PANEL WITH GFR - Abnormal; Notable for the following components:   CO2 20 (*)    Glucose, Bld 109 (*)    Calcium 10.6 (*)    All other components within normal limits  BRAIN NATRIURETIC PEPTIDE  TROPONIN I (HIGH SENSITIVITY)  TROPONIN I (HIGH SENSITIVITY)    EKG: EKG Interpretation Date/Time:  Wednesday August 06 2023 15:45:42 EDT Ventricular Rate:  56 PR  Interval:  156 QRS Duration:  88 QT Interval:  396 QTC Calculation: 383 R Axis:   154  Text Interpretation: Right and left arm electrode reversal, interpretation assumes no reversal Sinus or ectopic atrial rhythm Probable lateral infarct, age indeterminate Confirmed by Patsey Lot [phone]) on 08/06/2023 9:28:30 PM  Radiology: [facility] Chest 2 View Result Date: 08/06/2023 CLINICAL DATA:  Cough and chest tightness. EXAM: CHEST - 2 VIEW COMPARISON:  04/19/2019 FINDINGS: The  heart is normal in size. Stable mediastinal contours. Right middle lobe scarring. No confluent airspace disease. No pulmonary edema. No pleural fluid or pneumothorax. Mild scoliotic curvature of the spine. IMPRESSION: No acute chest findings. Right middle lobe scarring. Electronically Signed   By: Andrea Gasman M.D.   On: 08/06/2023 15:58     Procedures   Medications Ordered in the ED - No data to display                                  Medical Decision Making Amount and/or Complexity of Data Reviewed Radiology: ordered.   Patient with shortness of breath and cough.  Somewhat acute on chronic.  EKG reassuring.  X-ray reassuring.  Reviewed note from recent PCP visit.  They had actually increased her Xanax  frequency.  However per patient the techs at the nursing home would not give it to her because it is a dangerous medicine so she has been without it for 3 days.  Reviewed drug database and actually had a Xanax  increased.  May be an anxiety component.  Doubt cardiac ischemia.  No pneumonia.  Appears stable discharge home.  Discussed with patient and her son.     Final diagnoses:  Dyspnea, unspecified type    ED Discharge Orders     None          Patsey Lot, MD 08/06/23 [phone]

## 2023-08-06 NOTE — ED Provider Triage Note (Signed)
 Emergency Medicine Provider Triage Evaluation Note  Tracey [name] , a 83 y.o. female  was evaluated in triage.  Pt complains of cough and trouble breathing when laying down. Patient states she notices shortness of breath when lying down to put eye drops in. Denies fever, chills. Patient does appreciate chest pain. Denies abdominal pain, nausea, or vomiting.   Review of Systems  Positive: Cough, chest pain, shortness of breath, sore throat Negative: Fever, chills, abdominal pain, nausea, vomiting  Physical Exam  BP 139/70 (BP Location: Left Arm)   Pulse (!) 59   Temp 98.1 F (36.7 C) (Oral)   Resp 18   Ht 5' 2 (1.575 m)   Wt 54.4 kg   SpO2 100%   BMI 21.95 kg/m  Gen:   Awake, no distress   Resp:  Normal effort, no obvious abnormalities on lung auscultation MSK:   Moves extremities without difficulty  Other:    Medical Decision Making  Medically screening exam initiated at 4:02 PM.  Appropriate orders placed.  Tracey [name] was informed that the remainder of the evaluation will be completed by another provider, this initial triage assessment does not replace that evaluation, and the importance of remaining in the ED until their evaluation is complete.  Orders include: CXR, CBC, BMP, UA, EKG, troponin   Janetta Terrall [place], [place] 08/06/23 1610

## 2023-08-06 NOTE — ED Triage Notes (Addendum)
 Arrived by EMS from [facility] (independent living). EMS reports patient reports cough X3-4 years. Reports she called EMS today because she had some chest tightness and difficulty breathing while lying on her back. NAD, VS [facility] EMS reports patient stated she stopped taking Xanax  3 days ago after being on Xanax  for 40 years.

## 2023-08-14 ENCOUNTER — Ambulatory Visit: Admitting: Nurse Practitioner

## 2023-08-14 ENCOUNTER — Encounter: Payer: Self-pay | Admitting: Nurse Practitioner

## 2023-08-14 VITALS — BP 122/76 | HR 52 | Temp 98.0°F | Resp 8 | Ht 62.0 in | Wt 112.4 lb

## 2023-08-14 DIAGNOSIS — M150 Primary generalized (osteo)arthritis: Secondary | ICD-10-CM

## 2023-08-14 DIAGNOSIS — K56609 Unspecified intestinal obstruction, unspecified as to partial versus complete obstruction: Secondary | ICD-10-CM

## 2023-08-14 DIAGNOSIS — E785 Hyperlipidemia, unspecified: Secondary | ICD-10-CM | POA: Diagnosis not present

## 2023-08-14 DIAGNOSIS — R4189 Other symptoms and signs involving cognitive functions and awareness: Secondary | ICD-10-CM

## 2023-08-14 DIAGNOSIS — I10 Essential (primary) hypertension: Secondary | ICD-10-CM

## 2023-08-14 DIAGNOSIS — F304 Manic episode in full remission: Secondary | ICD-10-CM | POA: Diagnosis not present

## 2023-08-14 DIAGNOSIS — R001 Bradycardia, unspecified: Secondary | ICD-10-CM

## 2023-08-14 MED ORDER — ALPRAZOLAM 0.5 MG PO TABS
0.5000 mg | ORAL_TABLET | Freq: Three times a day (TID) | ORAL | 1 refills | Status: DC | PRN
Start: 2023-08-14 — End: 2023-08-26

## 2023-08-14 NOTE — Assessment & Plan Note (Signed)
 f/u endocrinology, Ca 10.3 04/18/23 2 01/12/20<<10.6 08/06/23,  PTH 79 01/12/20. GDR of Lithium  may help

## 2023-08-14 NOTE — Assessment & Plan Note (Signed)
 takes  Tylenol , s/p R knee arthroplasty.             Cervical degenerative changes/dystonia, better, CT/MRI 12/2019 ruled out acute process, aches in neck R>L travels to the back of head, comes and goes, not disabling, f/u Neurosurgery. Failed Cymbalta , Gabapentin , Robaxin , Ultracet . s/p Botulinum Toxin inj by Neurology. ESR 9, CRP 2.7 10/09/20(ESR 6 07/30/22). The patient stated Alprazolam  and Tylenol  are kind of effective. Improved on Topamax, but associated with weight loss.  No Parkinson's disease, took her off Sinemet  per Neurology. Currently taking inj

## 2023-08-14 NOTE — Assessment & Plan Note (Signed)
blood pressure is controlled on Losartan 

## 2023-08-14 NOTE — Assessment & Plan Note (Signed)
 LDL 121 07/29/23, on diet, tolerated Pravastatin 

## 2023-08-14 NOTE — Assessment & Plan Note (Signed)
 ED eval 08/06/23 for dyspnea, chronic cough, EKG, CXR, CMP, BNP, CBC/diff unremarkable. Hx of Xanax  use for anxiety.

## 2023-08-14 NOTE — Assessment & Plan Note (Addendum)
 12/2019 MRI brain showed chronic microvascular ischemic changes, no acute disease with mild cortical atrophy, Vit B12 466 07/30/22, TSH 1.56 07/29/23 Needs higher level of care

## 2023-08-14 NOTE — Assessment & Plan Note (Signed)
 improved from 40s to 50s. Hx of normalized HR after Tylenol  PM dc'd, GDR of Lithium  helped too. BNP 67.4 08/06/23

## 2023-08-14 NOTE — Assessment & Plan Note (Signed)
stable, on Lithium, GDR due to bradycardia, takes Alprazolam too, TSH 2.08 07/30/22. Declined Dr Hyacinth Meeker started low dose of Depakote. Lithium level 0.4 07/30/22

## 2023-08-14 NOTE — Patient Instructions (Addendum)
 Prn The patient is moving into [facility] for higher level of care.

## 2023-08-14 NOTE — Progress Notes (Unsigned)
 Location:   [facility]   Place of Service:    Provider: Larwance Hark NP  Espen Bethel X, NP  Patient Care Team: Lamarr Feenstra X, NP as PCP - General (Internal Medicine) Devonna Sieving, MD as Consulting Physician (Gynecologic Oncology) Cloretta Arley NOVAK, MD as Consulting Physician (Oncology) Luis Purchase, MD as Consulting Physician (Gastroenterology) Octavia Charleston, MD as Consulting Physician (Ophthalmology) Cleotilde Fairy POUR, MD as Referring Physician (Neurology) Tat, Asberry RAMAN, DO as Consulting Physician (Neurology)  Extended Emergency Contact Information Primary Emergency Contact: Corlis Sieving Mulders Address: [address]          [place], [place] [address] [place] Home Phone: [phone] Mobile Phone: [phone] Relation: Son Secondary Emergency Contact: Corlis Comer Numbers Home Phone: [phone] Mobile Phone: [phone] Relation: Relative  Code Status:  DNR Goals of care: Advanced Directive information    01/23/2023    3:15 PM  Advanced Directives  Does Patient Have a Medical Advance Directive? Yes  Type of Advance Directive Healthcare Power of Attorney  Does patient want to make changes to medical advance directive? No - Patient declined  Copy of Healthcare Power of Attorney in Chart? Yes - validated most recent copy scanned in chart (See row information)     Chief Complaint  Patient presents with   Medical Management of Chronic Issues    2 week follow up  Pt stated the visit for today is what you all were discussing last visit and she wants to talk about assisted living. PT stated that she wanted to talk with you about some due to the people at [facility] believes that she is taking too many/much medications     HPI:  Pt is a 83 y.o. female seen today for medical management of chronic diseases.    ED eval 08/06/23 for dyspnea, chronic cough, EKG, CXR, CMP, BNP, CBC/diff unremarkable. Hx of Xanax  use for anxiety.  Dry mouth  syndrome, resolved after dentist recommend mouth wash             Persisted running nose, facial pressure, sore throat, underwent ENT evaluation              Esophageal candidiasis, recurrent thrush identified on swallow study, FEES showed diffused white secretion coating areas of white plaques throughout pharynx. The patient c/o sore in buccal sides of tongue and in her throat when swallows. Treated with Diflucan  in the past, underwent GI evaluation, EGD 03/25/22 normal findings. Last treated with Diflucan  300mg  x1 04/10/22, then followed 200mg  qd x1 wk, may repeat x1.                Edema, LLE negative DVT venous US , L>R. Medial thickened swelling area about her palm sized, mild warmth, redness, and indurated area for a few month. Varicose veins LLE not new.  Tinnitus, underwent eval of  ENT, suggested music background.              Hx of GIST of small intestine s/p resection. MiraLax , Psyllium              Dysphagia, GI evaluated, EGD 03/25/22 wnl, working with ST, thicken liquids.              Constipation, Metamucil, MiraLax ,  effective.              GERD, stable, on Omeprazole , Hgb 10.6 08/06/23             HTN, blood pressure is controlled on Losartan   Bipolar disorder, stable, on Lithium , GDR due to bradycardia, takes Alprazolam  too, TSH 2.08 07/30/22. Declined Dr Cleotilde started low dose of Depakote . Lithium  level 0.4 07/30/22             Bradycardia, improved from 40s to 50s. Hx of normalized HR after Tylenol  PM dc'd, GDR of Lithium  helped too. BNP 67.4 08/06/23             Hypercalcemia, f/u endocrinology, Ca 10.3 04/18/23 2 01/12/20<<10.6 08/06/23,  PTH 79 01/12/20. GDR of Lithium  may help            OA, takes  Tylenol , s/p R knee arthroplasty.             Cervical degenerative changes/dystonia, better, CT/MRI 12/2019 ruled out acute process, aches in neck R>L travels to the back of head, comes and goes, not disabling, f/u Neurosurgery. Failed Cymbalta , Gabapentin , Robaxin , Ultracet . s/p  Botulinum Toxin inj by Neurology. ESR 9, CRP 2.7 10/09/20(ESR 6 07/30/22). The patient stated Alprazolam  and Tylenol  are kind of effective. Improved on Topamax, but associated with weight loss.  No Parkinson's disease, took her off Sinemet  per Neurology. Currently taking inj               Cognitive impairment: 12/2019 MRI brain showed chronic microvascular ischemic changes, no acute disease with mild cortical atrophy, Vit B12 466 07/30/22, TSH 1.56 07/29/23             Hyperlipidemia, LDL 121 07/29/23, on diet, tolerated Pravastatin      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, possible    Tremors possible parkinsons   Manic disorder, single episode, in full remission (HCC) 12/01/2009   Sixth nerve palsy    Past Surgical History:  Procedure Laterality Date   BILATERAL SALPINGOOPHORECTOMY  09/22/2007   CATARACT EXTRACTION Bilateral    ESOPHAGOGASTRODUODENOSCOPY (EGD) WITH PROPOFOL  N/A 03/25/2022   Procedure: ESOPHAGOGASTRODUODENOSCOPY (EGD) WITH PROPOFOL ;  Surgeon: Legrand Victory LITTIE DOUGLAS, MD;  Location: [facility];  Service: Gastroenterology;  Laterality: N/A;   Gastrointestinal Stroma Tumor,  Other  1960   GIST Surgery   ILEOCECETOMY  09/22/2007   OVARIAN CYST REMOVAL Right 1967   SMALL INTESTINE SURGERY  09/22/2007   TOTAL KNEE ARTHROPLASTY Right 10/05/2019   Procedure: RIGHT TOTAL KNEE ARTHROPLASTY;  Surgeon: Addie Cordella Hamilton, MD;  Location[facility];  Service: Orthopedics;  Laterality: Right;   TOTAL VAGINAL HYSTERECTOMY  1986   Fibroids    Allergies  Allergen Reactions   Lisinopril Cough   Penicillins Itching    50 years ago   Adhesive [Tape] Itching   Atorvastatin Itching   Dilaudid  [Hydromorphone  Hcl] Itching   Hydromorphone  Itching    Allergies as of 08/14/2023       Reactions   Lisinopril Cough   Penicillins Itching   50 years ago   Adhesive [tape] Itching   Atorvastatin Itching  Dilaudid  [hydromorphone  Hcl] Itching   Hydromorphone  Itching        Medication List        Accurate as of August 14, 2023 11:59 PM. If you have any questions, ask your nurse or doctor.          STOP taking these medications    DULoxetine  20 MG capsule Commonly known as: Cymbalta  Stopped by: Rielle Schlauch X Shalea Tomczak   Restasis 0.05 % ophthalmic emulsion Generic drug: cycloSPORINE Stopped by: Jayleen Afonso X Twala Collings       TAKE these medications    ALLEGRA ALLERGY PO Take 1 tablet by mouth daily.   ALPRAZolam  0.5 MG tablet Commonly known as: XANAX  Take 1 tablet (0.5 mg total) by mouth 3 (three) times daily as needed for anxiety.   aspirin  81 MG chewable tablet Chew 81 mg by mouth daily.   Biotene Dry Mouth Lozg Use as directed 1 tablet in the mouth or throat 4 (four) times daily as needed.   bismuth subsalicylate 262 MG/15ML suspension Commonly known as: PEPTO BISMOL Take 30 mLs by mouth every 6 (six) hours as needed for diarrhea or loose stools.   clindamycin  150 MG capsule Commonly known as: CLEOCIN  Take 600mg  1 hour prior to dental procedure   FISH OIL HIGH POTENCY PO Take by mouth.   latanoprost  0.005 % ophthalmic solution Commonly known as: XALATAN  Place 1 drop into both eyes at bedtime.   lithium  carbonate 150 MG capsule TAKE ONE CAPSULE BY MOUTH TWICE DAILY WITH MEALS   losartan  50 MG tablet Commonly known as: COZAAR  TAKE ONE TABLET BY MOUTH ONCE DAILY   mirabegron  ER 25 MG Tb24 tablet Commonly known as:  Myrbetriq  Take 1 tablet (25 mg total) by mouth daily.   multivitamin tablet Take 1 tablet by mouth daily.   omeprazole  20 MG capsule Commonly known as: PRILOSEC Take 1 capsule (20 mg total) by mouth daily.   polyethylene glycol 17 g packet Commonly known as: MIRALAX  / GLYCOLAX  Take 17 g by mouth daily as needed.   pravastatin  10 MG tablet Commonly known as: PRAVACHOL  Take 1 tablet (10 mg total) by mouth daily.   QC TUMERIC COMPLEX PO Take 1 capsule by mouth 3 (three) times daily.   Systane 0.4-0.3 % Soln Generic drug: Polyethyl Glycol-Propyl Glycol Apply 1 drop to eye as needed (Both eyes).   topiramate 25 MG tablet Commonly known as: TOPAMAX TAKE ONE TABLET BY MOUTH TWICE DAILY IN THE MORNING AND IN THE EVENING   triamcinolone  cream 0.5 % Commonly known as: KENALOG  APPLY TO THE AFFECTED AREA(S) TOPICALLY TWICE DAILY   VITA-C PO Take 1 capsule by mouth daily.   VITAMIN D  (CHOLECALCIFEROL) PO Take 1 capsule by mouth daily.        Review of Systems  Constitutional:  Negative for appetite change, fatigue and fever.  HENT:  Positive for hearing loss and rhinorrhea. Negative for congestion.   Eyes:  Negative for visual disturbance.       Glaucoma, diplopia R+L lateral peripheral visual fields only.   Respiratory:  Positive for cough and shortness of breath. Negative for wheezing.        Occasionally DOE, hacking cough  Cardiovascular:  Positive for leg swelling.  Gastrointestinal:  Negative for abdominal pain and constipation.  Genitourinary:  Positive for frequency. Negative for dysuria and urgency.       Couple of times at night, no difficulty of returning asleep.   Musculoskeletal:  Positive for arthralgias and gait problem.  Walker.   Skin:  Negative for color change.  Neurological:  Negative for tremors, speech difficulty, light-headedness and headaches.       Comes and goes nature of the neck/occipital pain. Stiff neck. Pain the patent right knee, s/p  TKR. Tremor is better since Sinemet .   Psychiatric/Behavioral:  Positive for sleep disturbance. Negative for confusion. The patient is nervous/anxious.        Better mood, feels not as anxious or depressive mood as prior.     Immunization History  Administered Date(s) Administered   Fluad Quad(high Dose 65+) 11/03/2017, 10/30/2020   Influenza Split 10/24/2008, 09/26/2009, 10/08/2011, 10/05/2012   Influenza, High Dose Seasonal PF 11/02/2013, 11/02/2013, 11/03/2017, 10/26/2018, 10/27/2019, 11/13/2022   Influenza, Quadrivalent, Recombinant, Inj, Pf 10/11/2016   Influenza,inj,Quad PF,6+ Mos 11/01/2014, 10/11/2015, 10/11/2016   Influenza-Unspecified 10/24/2008, 09/26/2009, 10/08/2011, 10/05/2012, 11/02/2013, 10/11/2016, 11/05/2021   Moderna Covid-19 Vaccine Bivalent Booster 11yrs & up 11/13/2022   Moderna SARS-COV2 Booster Vaccination 06/13/2020   Moderna Sars-Covid-2 Vaccination 01/18/2019, 02/15/2019, 11/23/2019   Pfizer Covid-19 Vaccine Bivalent Booster 4yrs & up 11/15/2021   Pneumococcal Conjugate-13 08/20/2011   Pneumococcal Polysaccharide-23 01/14/2005, 06/29/2014   Pneumococcal-Unspecified 01/14/2005   Tdap 08/20/2011, 04/18/2023   Zoster Recombinant(Shingrix) 11/09/2018, 12/01/2018, 03/01/2019   Zoster, Live 10/08/2011   Pertinent  Health Maintenance Due  Topic Date Due   INFLUENZA VACCINE  08/15/2023   DEXA SCAN  Completed      10/17/2022    2:30 PM 01/23/2023    3:14 PM 04/18/2023    8:59 AM 04/18/2023    2:40 PM 07/31/2023    1:01 PM  Fall Risk  Falls in the past year? 0 0 0 0 1  Was there an injury with Fall? 0 0 0 0 0  Was there an injury with Fall? - Comments     hit right knee  Fall Risk Category Calculator 0 0 0 0 1  Patient at Risk for Falls Due to No Fall Risks  No Fall Risks No Fall Risks No Fall Risks  Fall risk Follow up Falls evaluation completed  Falls evaluation completed Falls evaluation completed Falls evaluation completed   Functional Status Survey:     Vitals:   08/14/23 1319  BP: 122/76  Pulse: (!) 52  Resp: (!) 8  Temp: 98 F (36.7 C)  TempSrc: Temporal  SpO2: 96%  Weight: 112 lb 6.4 oz (51 kg)  Height: 5' 2 (1.575 m)   Body mass index is 20.56 kg/m. Physical Exam Vitals and nursing note reviewed.  Constitutional:      Appearance: Normal appearance.  HENT:     Head: Normocephalic and atraumatic.     Nose: Nose normal.     Mouth/Throat:     Mouth: Mucous membranes are moist.  Eyes:     Extraocular Movements: Extraocular movements intact.     Conjunctiva/sclera: Conjunctivae normal.     Pupils: Pupils are equal, round, and reactive to light.  Cardiovascular:     Rate and Rhythm: Normal rate and regular rhythm.     Heart sounds: Murmur heard.     Comments: DP pulses present R+L. HR in 50s Pulmonary:     Effort: Pulmonary effort is normal.     Breath sounds: No rales.  Abdominal:     General: Bowel sounds are normal.     Palpations: Abdomen is soft.     Tenderness: There is no abdominal tenderness.  Musculoskeletal:     Cervical back: Normal range of motion and neck supple.  Right lower leg: Edema present.     Left lower leg: Edema present.     Comments: Chronic R knee pain is improved. S/p TKR right. Felt lateral right thigh muscle is tight.  Stiff neck, comes and goes neck/occipital pain. Trace edema BLE. Arthritic joint change fingers.   Skin:    General: Skin is warm and dry.     Comments: Improved the medial lower left leg a palm sized thickened, slightly reddened area, mild discomfort when palpated. DP present left foot. Scattered varicose veins left leg 05/09/22 medical left MTJ callous, plantar aspect 3rd toe callous, the left 2nd toe pain after injury, but no bruise, deformity, swelling, redness, able to PROM w/o pain.    Neurological:     General: No focal deficit present.     Mental Status: She is alert and oriented to person, place, and time. Mental status is at baseline.     Motor: No weakness.      Coordination: Coordination abnormal.     Gait: Gait abnormal.     Comments: Resting tremor in fingers, near resolution.   Psychiatric:        Mood and Affect: Mood normal.        Behavior: Behavior normal.        Thought Content: Thought content normal.     Labs reviewed: Recent Labs    04/18/23 1524 08/06/23 1609  NA 140 135  K 4.1 4.1  CL 113* 107  CO2 23 20*  GLUCOSE 96 109*  BUN 15 21  CREATININE 0.79 0.89  CALCIUM 10.3 10.6*  MG 2.1  --    Recent Labs    04/18/23 1524 08/06/23 1609  AST 18 32  ALT 12 27  ALKPHOS  --  77  BILITOT 0.5 0.5  PROT 6.5 7.4  ALBUMIN  --  3.5   Recent Labs    04/18/23 1524 07/29/23 0711 08/06/23 1609  WBC 8.0 8.9 11.1*  NEUTROABS 5,720 7,022 8.4*  HGB 12.2 12.6 10.6*  HCT 37.0 38.8 34.0*  MCV 97.1 101.3* 102.7*  PLT 277 294 382   Lab Results  Component Value Date   TSH 1.56 07/29/2023   No results found for: HGBA1C Lab Results  Component Value Date   CHOL 212 (H) 07/29/2023   HDL 62 07/29/2023   LDLCALC 121 (H) 07/29/2023   TRIG 173 (H) 07/29/2023   CHOLHDL 3.4 07/29/2023    Significant Diagnostic Results in last 30 days:  DG Chest 2 View Result Date: 08/06/2023 CLINICAL DATA:  Cough and chest tightness. EXAM: CHEST - 2 VIEW COMPARISON:  04/19/2019 FINDINGS: The heart is normal in size. Stable mediastinal contours. Right middle lobe scarring. No confluent airspace disease. No pulmonary edema. No pleural fluid or pneumothorax. Mild scoliotic curvature of the spine. IMPRESSION: No acute chest findings. Right middle lobe scarring. Electronically Signed   By: Andrea Gasman M.D.   On: 08/06/2023 15:58    Assessment/Plan  Essential (primary) hypertension blood pressure is controlled on Losartan   Bipolar I disorder, single manic episode, in full remission (HCC) stable, on Lithium , GDR due to bradycardia, takes Alprazolam  too, TSH 2.08 07/30/22. Declined Dr Cleotilde started low dose of Depakote . Lithium  level 0.4  07/30/22                           Bradycardia improved from 40s to 50s. Hx of normalized HR after Tylenol  PM dc'd, GDR of Lithium  helped too. BNP 67.4  08/06/23  Hypercalcemia  f/u endocrinology, Ca 10.3 04/18/23 2 01/12/20<<10.6 08/06/23,  PTH 79 01/12/20. GDR of Lithium  may help  Arthritis, degenerative takes  Tylenol , s/p R knee arthroplasty.             Cervical degenerative changes/dystonia, better, CT/MRI 12/2019 ruled out acute process, aches in neck R>L travels to the back of head, comes and goes, not disabling, f/u Neurosurgery. Failed Cymbalta , Gabapentin , Robaxin , Ultracet . s/p Botulinum Toxin inj by Neurology. ESR 9, CRP 2.7 10/09/20(ESR 6 07/30/22). The patient stated Alprazolam  and Tylenol  are kind of effective. Improved on Topamax, but associated with weight loss.  No Parkinson's disease, took her off Sinemet  per Neurology. Currently taking inj    Cognitive impairment 12/2019 MRI brain showed chronic microvascular ischemic changes, no acute disease with mild cortical atrophy, Vit B12 466 07/30/22, TSH 1.56 07/29/23 Needs higher level of care  HLD (hyperlipidemia) LDL 121 07/29/23, on diet, tolerated Pravastatin   SBO (small bowel obstruction) ED eval 08/06/23 for dyspnea, chronic cough, EKG, CXR, CMP, BNP, CBC/diff unremarkable. Hx of Xanax  use for anxiety.    Family/ staff Communication: plan of care reviewed with the patient.   Labs/tests ordered:  None

## 2023-08-15 ENCOUNTER — Encounter: Payer: Self-pay | Admitting: Nurse Practitioner

## 2023-08-18 ENCOUNTER — Telehealth: Admitting: *Deleted

## 2023-08-18 NOTE — Telephone Encounter (Signed)
 Copied from CRM #[id number]. Topic: Clinical - Medication Question >> Aug 18, 2023  3:38 PM Cherylann RAMAN wrote: Reason for CRM: Ms. Sheela, nurse, [facility] is requesting a list of patient's medications. Patient is being moved to another level of care. Please fax to Fax: [phone]   Current medication list printed and faxed as requested.

## 2023-08-22 ENCOUNTER — Non-Acute Institutional Stay: Payer: Self-pay | Admitting: Nurse Practitioner

## 2023-08-22 ENCOUNTER — Encounter: Payer: Self-pay | Admitting: Nurse Practitioner

## 2023-08-22 DIAGNOSIS — E785 Hyperlipidemia, unspecified: Secondary | ICD-10-CM | POA: Diagnosis not present

## 2023-08-22 DIAGNOSIS — R001 Bradycardia, unspecified: Secondary | ICD-10-CM

## 2023-08-22 DIAGNOSIS — I83892 Varicose veins of left lower extremities with other complications: Secondary | ICD-10-CM

## 2023-08-22 DIAGNOSIS — F0390 Unspecified dementia without behavioral disturbance: Secondary | ICD-10-CM

## 2023-08-22 DIAGNOSIS — G243 Spasmodic torticollis: Secondary | ICD-10-CM

## 2023-08-22 DIAGNOSIS — R1312 Dysphagia, oropharyngeal phase: Secondary | ICD-10-CM

## 2023-08-22 DIAGNOSIS — K219 Gastro-esophageal reflux disease without esophagitis: Secondary | ICD-10-CM

## 2023-08-22 DIAGNOSIS — F304 Manic episode in full remission: Secondary | ICD-10-CM

## 2023-08-22 DIAGNOSIS — I10 Essential (primary) hypertension: Secondary | ICD-10-CM

## 2023-08-22 DIAGNOSIS — K5901 Slow transit constipation: Secondary | ICD-10-CM

## 2023-08-22 DIAGNOSIS — M150 Primary generalized (osteo)arthritis: Secondary | ICD-10-CM

## 2023-08-22 NOTE — Assessment & Plan Note (Signed)
blood pressure is controlled on Losartan 

## 2023-08-22 NOTE — Assessment & Plan Note (Signed)
 f/u endocrinology, Ca 10.3 04/18/23 2 01/12/20<<10.6 08/06/23,  PTH 79 01/12/20. GDR of Lithium  may help

## 2023-08-22 NOTE — Assessment & Plan Note (Signed)
Metamucil, MiraLax,  effective.  

## 2023-08-22 NOTE — Assessment & Plan Note (Signed)
,   stable, on Lithium , GDR due to bradycardia, takes Alprazolam  too, TSH 2.08 07/30/22. Declined Dr Cleotilde started low dose of Depakote . Lithium  level 0.4 07/30/22

## 2023-08-22 NOTE — Assessment & Plan Note (Signed)
takes  Tylenol, s/p R knee arthroplasty.  

## 2023-08-22 NOTE — Assessment & Plan Note (Signed)
 LDL 121 07/29/23, on diet, tolerated Pravastatin 

## 2023-08-22 NOTE — Assessment & Plan Note (Signed)
LLE negative DVT venous US, L>R. Medial thickened swelling area about her palm sized, mild warmth, redness, and indurated area for a few month. Varicose veins LLE not new.  

## 2023-08-22 NOTE — Progress Notes (Signed)
 Location:   [facility] Room Number: 812-A Place of Service:  ALF [phone]) Provider:  Montgomery Rothlisberger, NP  PCP: Sanjuanita Condrey X, NP  Patient Care Team: Andjela Wickes X, NP as PCP - General (Internal Medicine) Devonna Sieving, MD as Consulting Physician (Gynecologic Oncology) Cloretta Arley NOVAK, MD as Consulting Physician (Oncology) Luis Purchase, MD as Consulting Physician (Gastroenterology) Octavia Charleston, MD as Consulting Physician (Ophthalmology) Cleotilde Fairy POUR, MD as Referring Physician (Neurology) Tat, Asberry RAMAN, DO as Consulting Physician (Neurology)  Extended Emergency Contact Information Primary Emergency Contact: Corlis Sieving Mulders Address: [address]          [place], [place] [address] [place] Home Phone: [phone] Mobile Phone: [phone] Relation: Son Secondary Emergency Contact: Corlis Comer Numbers Home Phone: [phone] Mobile Phone: [phone] Relation: Relative  Code Status:  FULL CODE Goals of care: Advanced Directive information    08/22/2023   10:47 AM  Advanced Directives  Does Patient Have a Medical Advance Directive? Yes  Type of Advance Directive Healthcare Power of Attorney  Does patient want to make changes to medical advance directive? No - Patient declined     Chief Complaint  Patient presents with   Medication Review    HPI:  Pt is a 83 y.o. female seen today for medical management of chronic diseases.      ED eval 08/06/23 for dyspnea, chronic cough, EKG, CXR, CMP, BNP, CBC/diff unremarkable. Hx of Xanax  use for anxiety.  Dry mouth syndrome, resolved after dentist recommend mouth wash             Persisted running nose, facial pressure, sore throat, underwent ENT evaluation              Esophageal candidiasis, recurrent thrush identified on swallow study, FEES showed diffused white secretion coating areas of white plaques throughout pharynx. The patient c/o sore in buccal sides of tongue and in her  throat when swallows. Treated with Diflucan  in the past, underwent GI evaluation, EGD 03/25/22 normal findings. Last treated with Diflucan  300mg  x1 04/10/22, then followed 200mg  qd x1 wk, may repeat x1.                Edema, LLE negative DVT venous US , L>R. Medial thickened swelling area about her palm sized, mild warmth, redness, and indurated area for a few month. Varicose veins LLE not new.  Tinnitus, underwent eval of  ENT, suggested music background.              Hx of GIST of small intestine s/p resection. MiraLax , Psyllium              Dysphagia, GI evaluated, EGD 03/25/22 wnl, working with ST, thicken liquids.              Constipation, Metamucil, MiraLax ,  effective.              GERD, stable, off Omeprazole , Hgb 10.6 08/06/23             HTN, blood pressure is controlled on Losartan              Bipolar disorder, stable, on Lithium , GDR due to bradycardia, takes Alprazolam  too, TSH 2.08 07/30/22. Declined Dr Cleotilde started low dose of Depakote . Lithium  level 0.4 07/30/22             Bradycardia, improved from 40s to 50s. Hx of normalized HR after Tylenol  PM dc'd, GDR of Lithium  helped too. BNP 67.4 08/06/23  Hypercalcemia, f/u endocrinology, Ca 10.3 04/18/23 2 01/12/20<<10.6 08/06/23,  PTH 79 01/12/20. GDR of Lithium  may help            OA, takes  Tylenol , s/p R knee arthroplasty.             Cervical degenerative changes/dystonia, better, CT/MRI 12/2019 ruled out acute process, aches in neck R>L travels to the back of head, comes and goes, not disabling, f/u Neurosurgery. Failed Cymbalta , Gabapentin , Robaxin , Ultracet . s/p Botulinum Toxin inj by Neurology. ESR 9, CRP 2.7 10/09/20(ESR 6 07/30/22). The patient stated Alprazolam  and Tylenol  are kind of effective. Improved on Topamax, but associated with weight loss.  No Parkinson's disease, took her off Sinemet  per Neurology. Currently taking inj               Cognitive impairment: 12/2019 MRI brain showed chronic microvascular ischemic changes,  no acute disease with mild cortical atrophy, Vit B12 466 07/30/22, TSH 1.56 07/29/23             Hyperlipidemia, LDL 121 07/29/23, on diet, tolerated Pravastatin    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, possible    Tremors possible parkinsons   Manic disorder, single episode, in full remission (HCC) 12/01/2009   Sixth nerve palsy    Past Surgical History:  Procedure Laterality Date   BILATERAL SALPINGOOPHORECTOMY  09/22/2007   CATARACT EXTRACTION Bilateral    ESOPHAGOGASTRODUODENOSCOPY (EGD) WITH PROPOFOL  N/A 03/25/2022   Procedure: ESOPHAGOGASTRODUODENOSCOPY (EGD) WITH PROPOFOL ;  Surgeon: Legrand Victory LITTIE DOUGLAS, MD;  Location: [facility];  Service: Gastroenterology;  Laterality: N/A;   Gastrointestinal Stroma Tumor,  Other  1960   GIST Surgery   ILEOCECETOMY  09/22/2007   OVARIAN CYST REMOVAL Right 1967   SMALL INTESTINE SURGERY  09/22/2007   TOTAL KNEE ARTHROPLASTY Right 10/05/2019   Procedure: RIGHT TOTAL KNEE ARTHROPLASTY;  Surgeon: Addie Cordella Hamilton, MD;  Location: [facility] OR;  Service: Orthopedics;  Laterality: Right;   TOTAL VAGINAL HYSTERECTOMY  1986   Fibroids    Allergies  Allergen Reactions   Lisinopril Cough   Penicillins Itching    50 years ago   Adhesive [Tape] Itching   Atorvastatin Itching   Dilaudid  [Hydromorphone  Hcl] Itching   Hydromorphone  Itching    Allergies as of 08/22/2023       Reactions   Lisinopril Cough    Penicillins Itching   50 years ago   Adhesive [tape] Itching   Atorvastatin Itching   Dilaudid  [hydromorphone  Hcl] Itching   Hydromorphone  Itching        Medication List        Accurate as of August 22, 2023 11:59 PM. If you have any questions, ask your nurse or doctor.          ALLEGRA ALLERGY PO Take 1 tablet by mouth daily.   ALPRAZolam  0.5 MG tablet Commonly known as: XANAX  Take 1 tablet (  0.5 mg total) by mouth 3 (three) times daily as needed for anxiety.   aspirin  81 MG chewable tablet Chew 81 mg by mouth daily.   Biotene Dry Mouth Lozg Use as directed 1 tablet in the mouth or throat 4 (four) times daily as needed.   bismuth subsalicylate 262 MG/15ML suspension Commonly known as: PEPTO BISMOL Take 30 mLs by mouth every 6 (six) hours as needed for diarrhea or loose stools.   clindamycin  150 MG capsule Commonly known as: CLEOCIN  Take 600mg  1 hour prior to dental procedure   cycloSPORINE 0.05 % ophthalmic emulsion Commonly known as: RESTASIS Place 1 drop into both eyes 2 (two) times daily.   FISH OIL HIGH POTENCY PO Take by mouth.   latanoprost  0.005 % ophthalmic solution Commonly known as: XALATAN  Place 1 drop into both eyes at bedtime.   lithium  carbonate 150 MG capsule TAKE ONE CAPSULE BY MOUTH TWICE DAILY WITH MEALS   losartan  50 MG tablet Commonly known as: COZAAR  TAKE ONE TABLET BY MOUTH ONCE DAILY   mirabegron  ER 25 MG Tb24 tablet Commonly known as: Myrbetriq  Take 1 tablet (25 mg total) by mouth daily.   multivitamin tablet Take 1 tablet by mouth daily.   omeprazole  20 MG capsule Commonly known as: PRILOSEC Take 1 capsule (20 mg total) by mouth daily.   polyethylene glycol 17 g packet Commonly known as: MIRALAX  / GLYCOLAX  Take 17 g by mouth daily as needed.   pravastatin  10 MG tablet Commonly known as: PRAVACHOL  Take 5 mg by mouth daily.   pravastatin  10 MG tablet Commonly known as: PRAVACHOL  Take 1 tablet (10 mg total) by  mouth daily.   QC TUMERIC COMPLEX PO Take 1 capsule by mouth 3 (three) times daily.   Systane 0.4-0.3 % Soln Generic drug: Polyethyl Glycol-Propyl Glycol Apply 1 drop to eye as needed (Both eyes).   Timolol  Maleate (Once-Daily) 0.5 % Soln Place 1 drop into both eyes daily.   topiramate 25 MG tablet Commonly known as: TOPAMAX TAKE ONE TABLET BY MOUTH TWICE DAILY IN THE MORNING AND IN THE EVENING   triamcinolone  cream 0.5 % Commonly known as: KENALOG  APPLY TO THE AFFECTED AREA(S) TOPICALLY TWICE DAILY   VITA-C PO Take 1 capsule by mouth daily.   VITAMIN D  (CHOLECALCIFEROL) PO Take 1 capsule by mouth daily.        Review of Systems  Constitutional:  Negative for appetite change, fatigue and fever.  HENT:  Positive for hearing loss and rhinorrhea. Negative for congestion.   Eyes:  Negative for visual disturbance.       Glaucoma, diplopia R+L lateral peripheral visual fields only.   Respiratory:  Positive for cough and shortness of breath. Negative for wheezing.        Occasionally DOE, hacking cough  Cardiovascular:  Positive for leg swelling.  Gastrointestinal:  Negative for abdominal pain and constipation.  Genitourinary:  Positive for frequency. Negative for dysuria and urgency.       Couple of times at night, no difficulty of returning asleep.   Musculoskeletal:  Positive for arthralgias and gait problem.       Walker.   Skin:  Negative for color change.  Neurological:  Negative for tremors, speech difficulty, light-headedness and headaches.       Comes and goes nature of the neck/occipital pain. Stiff neck. Pain the patent right knee, s/p TKR. Tremor is better since Sinemet .   Psychiatric/Behavioral:  Positive for sleep disturbance. Negative for confusion. The patient is nervous/anxious.  Better mood, feels not as anxious or depressive mood as prior.     Immunization History  Administered Date(s) Administered   Fluad Quad(high Dose 65+) 11/03/2017,  10/30/2020   Influenza Split 10/24/2008, 09/26/2009, 10/08/2011, 10/05/2012   Influenza, High Dose Seasonal PF 11/02/2013, 11/02/2013, 11/03/2017, 10/26/2018, 10/27/2019, 11/13/2022   Influenza, Quadrivalent, Recombinant, Inj, Pf 10/11/2016   Influenza,inj,Quad PF,6+ Mos 11/01/2014, 10/11/2015, 10/11/2016   Influenza-Unspecified 10/24/2008, 09/26/2009, 10/08/2011, 10/05/2012, 11/02/2013, 10/11/2016, 11/05/2021   Moderna Covid-19 Vaccine Bivalent Booster 32yrs & up 11/13/2022   Moderna SARS-COV2 Booster Vaccination 06/13/2020   Moderna Sars-Covid-2 Vaccination 01/18/2019, 02/15/2019, 11/23/2019   Pfizer Covid-19 Vaccine Bivalent Booster 46yrs & up 11/15/2021   Pneumococcal Conjugate-13 08/20/2011   Pneumococcal Polysaccharide-23 01/14/2005, 06/29/2014   Pneumococcal-Unspecified 01/14/2005   Tdap 08/20/2011, 04/18/2023   Zoster Recombinant(Shingrix) 11/09/2018, 12/01/2018, 03/01/2019   Zoster, Live 10/08/2011   Pertinent  Health Maintenance Due  Topic Date Due   INFLUENZA VACCINE  08/15/2023   DEXA SCAN  Completed      10/17/2022    2:30 PM 01/23/2023    3:14 PM 04/18/2023    8:59 AM 04/18/2023    2:40 PM 07/31/2023    1:01 PM  Fall Risk  Falls in the past year? 0 0 0 0 1  Was there an injury with Fall? 0 0 0 0 0  Was there an injury with Fall? - Comments     hit right knee  Fall Risk Category Calculator 0 0 0 0 1  Patient at Risk for Falls Due to No Fall Risks  No Fall Risks No Fall Risks No Fall Risks  Fall risk Follow up Falls evaluation completed  Falls evaluation completed Falls evaluation completed Falls evaluation completed   Functional Status Survey:    Vitals:   08/22/23 1041  BP: (!) 101/53  Pulse: (!) 53  Temp: (!) 97.3 F (36.3 C)  Weight: 112 lb 3.2 oz (50.9 kg)  Height: 5' 2 (1.575 m)   Body mass index is 20.52 kg/m. Physical Exam Vitals and nursing note reviewed.  Constitutional:      Appearance: Normal appearance.  HENT:     Head: Normocephalic and  atraumatic.     Nose: Nose normal.     Mouth/Throat:     Mouth: Mucous membranes are moist.  Eyes:     Extraocular Movements: Extraocular movements intact.     Conjunctiva/sclera: Conjunctivae normal.     Pupils: Pupils are equal, round, and reactive to light.  Cardiovascular:     Rate and Rhythm: Normal rate and regular rhythm.     Heart sounds: Murmur heard.     Comments: DP pulses present R+L. HR in 50s Pulmonary:     Effort: Pulmonary effort is normal.     Breath sounds: No rales.  Abdominal:     General: Bowel sounds are normal.     Palpations: Abdomen is soft.     Tenderness: There is no abdominal tenderness.  Musculoskeletal:     Cervical back: Normal range of motion and neck supple.     Right lower leg: Edema present.     Left lower leg: Edema present.     Comments: Chronic R knee pain is improved. S/p TKR right. Felt lateral right thigh muscle is tight.  Stiff neck, comes and goes neck/occipital pain. Trace edema BLE. Arthritic joint change fingers.   Skin:    General: Skin is warm and dry.     Comments: Improved the medial lower left leg a palm  sized thickened, slightly reddened area, mild discomfort when palpated. DP present left foot. Scattered varicose veins left leg 05/09/22 medical left MTJ callous, plantar aspect 3rd toe callous, the left 2nd toe pain after injury, but no bruise, deformity, swelling, redness, able to PROM w/o pain.    Neurological:     General: No focal deficit present.     Mental Status: She is alert and oriented to person, place, and time. Mental status is at baseline.     Motor: No weakness.     Coordination: Coordination abnormal.     Gait: Gait abnormal.     Comments: Resting tremor in fingers, near resolution.   Psychiatric:        Mood and Affect: Mood normal.        Behavior: Behavior normal.        Thought Content: Thought content normal.     Labs reviewed: Recent Labs    04/18/23 1524 08/06/23 1609  NA 140 135  K 4.1 4.1  CL  113* 107  CO2 23 20*  GLUCOSE 96 109*  BUN 15 21  CREATININE 0.79 0.89  CALCIUM 10.3 10.6*  MG 2.1  --    Recent Labs    04/18/23 1524 08/06/23 1609  AST 18 32  ALT 12 27  ALKPHOS  --  77  BILITOT 0.5 0.5  PROT 6.5 7.4  ALBUMIN  --  3.5   Recent Labs    04/18/23 1524 07/29/23 0711 08/06/23 1609  WBC 8.0 8.9 11.1*  NEUTROABS 5,720 7,022 8.4*  HGB 12.2 12.6 10.6*  HCT 37.0 38.8 34.0*  MCV 97.1 101.3* 102.7*  PLT 277 294 382   Lab Results  Component Value Date   TSH 1.56 07/29/2023   No results found for: HGBA1C Lab Results  Component Value Date   CHOL 212 (H) 07/29/2023   HDL 62 07/29/2023   LDLCALC 121 (H) 07/29/2023   TRIG 173 (H) 07/29/2023   CHOLHDL 3.4 07/29/2023    Significant Diagnostic Results in last 30 days:  DG Chest 2 View Result Date: 08/06/2023 CLINICAL DATA:  Cough and chest tightness. EXAM: CHEST - 2 VIEW COMPARISON:  04/19/2019 FINDINGS: The heart is normal in size. Stable mediastinal contours. Right middle lobe scarring. No confluent airspace disease. No pulmonary edema. No pleural fluid or pneumothorax. Mild scoliotic curvature of the spine. IMPRESSION: No acute chest findings. Right middle lobe scarring. Electronically Signed   By: Andrea Gasman M.D.   On: 08/06/2023 15:58    Assessment/Plan Major neurocognitive disorder (HCC) 12/2019 MRI brain showed chronic microvascular ischemic changes, no acute disease with mild cortical atrophy, Vit B12 466 07/30/22, TSH 1.56 07/29/23 Obtain MMSE  HLD (hyperlipidemia) LDL 121 07/29/23, on diet, tolerated Pravastatin   Cervical dystonia   Cervical degenerative changes/dystonia, better, CT/MRI 12/2019 ruled out acute process, aches in neck R>L travels to the back of head, comes and goes, not disabling, f/u Neurosurgery. Failed Cymbalta , Gabapentin , Robaxin , Ultracet . s/p Botulinum Toxin inj by Neurology. ESR 9, CRP 2.7 10/09/20(ESR 6 07/30/22). The patient stated Alprazolam  and Tylenol  are kind of  effective. Improved on Topamax, but associated with weight loss.  No Parkinson's disease, took her off Sinemet  per Neurology. Currently taking inj    Arthritis, degenerative  takes  Tylenol , s/p R knee arthroplasty.   Hypercalcemia f/u endocrinology, Ca 10.3 04/18/23 2 01/12/20<<10.6 08/06/23,  PTH 79 01/12/20. GDR of Lithium  may help  Bradycardia improved from 40s to 50s. Hx of normalized HR after Tylenol  PM dc'd, GDR  of Lithium  helped too. BNP 67.4 08/06/23  Bipolar I disorder, single manic episode, in full remission (HCC) , stable, on Lithium , GDR due to bradycardia, takes Alprazolam  too, TSH 2.08 07/30/22. Declined Dr Cleotilde started low dose of Depakote . Lithium  level 0.4 07/30/22  Essential (primary) hypertension  blood pressure is controlled on Losartan   GERD (gastroesophageal reflux disease)  stable, off Omeprazole , Hgb 10.6 08/06/23  Slow transit constipation  Metamucil, MiraLax ,  effective.   Dysphagia GI evaluated, EGD 03/25/22 wnl, working with ST, thicken liquids.   Varicose veins of left leg with edema LLE negative DVT venous US , L>R. Medial thickened swelling area about her palm sized, mild warmth, redness, and indurated area for a few month. Varicose veins LLE not new.      Family/ staff Communication: plan of care reviewed with the patient and charge nurse  Labs/tests ordered:  none

## 2023-08-22 NOTE — Assessment & Plan Note (Signed)
 Cervical degenerative changes/dystonia, better, CT/MRI 12/2019 ruled out acute process, aches in neck R>L travels to the back of head, comes and goes, not disabling, f/u Neurosurgery. Failed Cymbalta , Gabapentin , Robaxin , Ultracet . s/p Botulinum Toxin inj by Neurology. ESR 9, CRP 2.7 10/09/20(ESR 6 07/30/22). The patient stated Alprazolam  and Tylenol  are kind of effective. Improved on Topamax, but associated with weight loss.  No Parkinson's disease, took her off Sinemet  per Neurology. Currently taking inj

## 2023-08-22 NOTE — Assessment & Plan Note (Addendum)
 stable, off Omeprazole , Hgb 10.6 08/06/23

## 2023-08-22 NOTE — Assessment & Plan Note (Signed)
 12/2019 MRI brain showed chronic microvascular ischemic changes, no acute disease with mild cortical atrophy, Vit B12 466 07/30/22, TSH 1.56 07/29/23 Obtain MMSE

## 2023-08-22 NOTE — Assessment & Plan Note (Signed)
 improved from 40s to 50s. Hx of normalized HR after Tylenol  PM dc'd, GDR of Lithium  helped too. BNP 67.4 08/06/23

## 2023-08-22 NOTE — Assessment & Plan Note (Signed)
GI evaluated, EGD 03/25/22 wnl, working with ST, thicken liquids.

## 2023-08-25 ENCOUNTER — Telehealth: Payer: Self-pay | Admitting: *Deleted

## 2023-08-25 DIAGNOSIS — M47812 Spondylosis without myelopathy or radiculopathy, cervical region: Secondary | ICD-10-CM | POA: Diagnosis not present

## 2023-08-25 NOTE — Telephone Encounter (Signed)
 Copied from CRM #[id number]. Topic: Clinical - Prescription Issue >> Aug 25, 2023 12:43 PM Laurier C wrote: Reason for CRM: Patient has a known allergy to the statin class of medication. Pharmacy has a script on file for the following medication: pravastatin  (PRAVACHOL ) 10 MG tablet and would like some clarification on if it should  be filled.    Please Advise.

## 2023-08-26 ENCOUNTER — Encounter: Payer: Self-pay | Admitting: Nurse Practitioner

## 2023-08-26 ENCOUNTER — Non-Acute Institutional Stay: Payer: Self-pay | Admitting: Nurse Practitioner

## 2023-08-26 DIAGNOSIS — R001 Bradycardia, unspecified: Secondary | ICD-10-CM

## 2023-08-26 DIAGNOSIS — Z0000 Encounter for general adult medical examination without abnormal findings: Secondary | ICD-10-CM | POA: Diagnosis not present

## 2023-08-26 NOTE — Progress Notes (Signed)
 Subjective:   Tracey [name] is a 83 y.o. female who presents for Medicare Annual (Subsequent) preventive examination @ [facility]  Visit Complete: In person  Patient Medicare AWV questionnaire was completed by the patient on 08/26/23; I have confirmed that all information answered by patient is correct and no changes since this date.  Cardiac Risk Factors include: advanced age (>21men, >34 women);dyslipidemia;hypertension     Objective:    Today's Vitals   08/26/23 1011  BP: 108/60  Pulse: 60  Temp: 97.8 F (36.6 C)  SpO2: 96%  Weight: 112 lb 3.2 oz (50.9 kg)  Height: 5' 2 (1.575 m)  PainSc: 2    Body mass index is 20.52 kg/m.     08/26/2023   10:31 AM 08/22/2023   10:47 AM 01/23/2023    3:15 PM 10/17/2022   11:00 AM 09/19/2022   10:40 AM 08/01/2022    2:44 PM 07/11/2022    9:45 AM  Advanced Directives  Does Patient Have a Medical Advance Directive? Yes Yes Yes Yes Yes Yes Yes  Type of Social research officer, government Power of [employer] Power of [employer] Power of [employer] Power of Attorney Healthcare Power of Attorney  Does patient want to make changes to medical advance directive? No - Patient declined No - Patient declined No - Patient declined No - Patient declined No - Patient declined No - Patient declined No - Patient declined  Copy of Healthcare Power of Attorney in Chart? Yes - validated most recent copy scanned in chart (See row information)  Yes - validated most recent copy scanned in chart (See row information) Yes - validated most recent copy scanned in chart (See row information) Yes - validated most recent copy scanned in chart (See row information) Yes - validated most recent copy scanned in chart (See row information) Yes - validated most recent copy scanned in chart (See row information)    Current Medications (verified) Outpatient Encounter Medications as of 08/26/2023   Medication Sig   acetaminophen  (TYLENOL ) 325 MG tablet Take 650 mg by mouth every 4 (four) hours as needed for mild pain (pain score 1-3).   ALPRAZolam  (XANAX ) 0.5 MG tablet Take 0.5 mg by mouth 3 (three) times daily.   amoxicillin (AMOXIL) 500 MG tablet Take 2,000 mg by mouth as directed. 1 hour prior to dental procedure per [facility] from [facility]   aspirin  81 MG chewable tablet Chew 81 mg by mouth daily.   cycloSPORINE (RESTASIS) 0.05 % ophthalmic emulsion Place 1 drop into both eyes 2 (two) times daily.   latanoprost  (XALATAN ) 0.005 % ophthalmic solution Place 1 drop into both eyes at bedtime.   lithium  carbonate 150 MG capsule TAKE ONE CAPSULE BY MOUTH TWICE DAILY WITH MEALS   losartan  (COZAAR ) 50 MG tablet TAKE ONE TABLET BY MOUTH ONCE DAILY   Polyethyl Glycol-Propyl Glycol (SYSTANE) 0.4-0.3 % SOLN Apply 1 drop to eye every 4 (four) hours as needed (Both eyes).   polyethylene glycol (MIRALAX  / GLYCOLAX ) 17 g packet Take 17 g by mouth daily.   pravastatin  (PRAVACHOL ) 10 MG tablet Take 5 mg by mouth daily.   Timolol  Maleate, Once-Daily, 0.5 % SOLN Place 1 drop into both eyes daily.   topiramate (TOPAMAX) 25 MG tablet TAKE ONE TABLET BY MOUTH TWICE DAILY IN THE MORNING AND IN THE EVENING   [DISCONTINUED] ALPRAZolam  (XANAX ) 0.5 MG tablet Take 1 tablet (0.5 mg total) by mouth 3 (three)  times daily as needed for anxiety. (Patient not taking: Reported on 08/26/2023)   [DISCONTINUED] Artificial Saliva (BIOTENE DRY MOUTH) LOZG Use as directed 1 tablet in the mouth or throat 4 (four) times daily as needed. (Patient not taking: Reported on 08/26/2023)   [DISCONTINUED] Ascorbic Acid (VITA-C PO) Take 1 capsule by mouth daily. (Patient not taking: Reported on 08/26/2023)   [DISCONTINUED] bismuth subsalicylate (PEPTO BISMOL) 262 MG/15ML suspension Take 30 mLs by mouth every 6 (six) hours as needed for diarrhea or loose stools. (Patient not taking: Reported on 08/26/2023)   [DISCONTINUED] clindamycin   (CLEOCIN ) 150 MG capsule Take 600mg  1 hour prior to dental procedure (Patient not taking: Reported on 08/26/2023)   [DISCONTINUED] Fexofenadine HCl (ALLEGRA ALLERGY PO) Take 1 tablet by mouth daily. (Patient not taking: Reported on 08/26/2023)   [DISCONTINUED] mirabegron  ER (MYRBETRIQ ) 25 MG TB24 tablet Take 1 tablet (25 mg total) by mouth daily. (Patient not taking: Reported on 08/26/2023)   [DISCONTINUED] Multiple Vitamin (MULTIVITAMIN) tablet Take 1 tablet by mouth daily. (Patient not taking: Reported on 08/26/2023)   [DISCONTINUED] Omega-3 Fatty Acids (FISH OIL HIGH POTENCY PO) Take by mouth. (Patient not taking: Reported on 08/26/2023)   [DISCONTINUED] omeprazole  (PRILOSEC) 20 MG capsule Take 1 capsule (20 mg total) by mouth daily. (Patient not taking: Reported on 08/26/2023)   [DISCONTINUED] polyethylene glycol (MIRALAX  / GLYCOLAX ) 17 g packet Take 17 g by mouth daily as needed. (Patient not taking: Reported on 08/26/2023)   [DISCONTINUED] pravastatin  (PRAVACHOL ) 10 MG tablet Take 1 tablet (10 mg total) by mouth daily. (Patient not taking: Reported on 08/26/2023)   [DISCONTINUED] triamcinolone  cream (KENALOG ) 0.5 % APPLY TO THE AFFECTED AREA(S) TOPICALLY TWICE DAILY (Patient not taking: Reported on 08/26/2023)   [DISCONTINUED] Turmeric (QC TUMERIC COMPLEX PO) Take 1 capsule by mouth 3 (three) times daily. (Patient not taking: Reported on 08/26/2023)   [DISCONTINUED] VITAMIN D , CHOLECALCIFEROL, PO Take 1 capsule by mouth daily. (Patient not taking: Reported on 08/26/2023)   No facility-administered encounter medications on file as of 08/26/2023.    Allergies (verified) Lisinopril, Penicillins, Adhesive [tape], Atorvastatin, Dilaudid  [hydromorphone  hcl], and Hydromorphone    History: Past Medical History:  Diagnosis Date   Anemia    Anxiety    Arthritis    Cardiac conduction disorder 03/30/2012   Overview:  STORY: ETT 03/09/2012 Echo 03/07/2012 normal Dr Ladona, bradycardia felt due to glaucoma eye  drops   Cervical dystonia    Diverticulosis of colon    DOE (dyspnea on exertion) 03/25/2018   Gastroesophageal cancer (HCC)    GERD (gastroesophageal reflux disease)    GIST (gastrointestinal stroma tumor), malignant, colon (HCC)    Glaucoma    Heart murmur    History of colon polyps 10/24/2008   Hypertension    Major neurocognitive disorder due to Parkinson's disease, possible    Tremors possible parkinsons   Manic disorder, single episode, in full remission (HCC) 12/01/2009   Sixth nerve palsy    Past Surgical History:  Procedure Laterality Date   BILATERAL SALPINGOOPHORECTOMY  09/22/2007   CATARACT EXTRACTION Bilateral    ESOPHAGOGASTRODUODENOSCOPY (EGD) WITH PROPOFOL  N/A 03/25/2022   Procedure: ESOPHAGOGASTRODUODENOSCOPY (EGD) WITH PROPOFOL ;  Surgeon: Legrand Victory LITTIE DOUGLAS, MD;  Location: [facility];  Service: Gastroenterology;  Laterality: N/A;   Gastrointestinal Stroma Tumor,  Other  1960   GIST Surgery   ILEOCECETOMY  09/22/2007   OVARIAN CYST REMOVAL Right 1967   SMALL INTESTINE SURGERY  09/22/2007   TOTAL KNEE ARTHROPLASTY Right 10/05/2019   Procedure: RIGHT TOTAL  KNEE ARTHROPLASTY;  Surgeon: Addie Cordella Hamilton, MD;  Location: [facility];  Service: Orthopedics;  Laterality: Right;   TOTAL VAGINAL HYSTERECTOMY  1986   Fibroids   Family History  Problem Relation Age of Onset   Congestive Heart Failure Mother    Dementia Mother    Heart disease Father    Diabetes Father    Lung cancer Son    Liver disease Neg Hx    Esophageal cancer Neg Hx    Colon cancer Neg Hx    Social History   Socioeconomic History   Marital status: Widowed    Spouse name: Not on file   Number of children: 2   Years of education: Bachelors   Highest education level: Not on file  Occupational History   Occupation: retired    Comment: made wedding dressings  Tobacco Use   Smoking status: Never   Smokeless tobacco: Never  Vaping Use   Vaping status: Never Used  Substance and Sexual  Activity   Alcohol use: No   Drug use: No   Sexual activity: Not Currently    Birth control/protection: Post-menopausal  Other Topics Concern   Not on file  Social History Narrative   Lives at home alone.   Right-handed.   No caffeine use.      Diet: Eats anything, like fruits, vegetables, regular diet      Do you drink/ eat things with caffeine? No      Marital status:  Widowed                             What year were you married ? 1965      Do you live in a house, apartment,assistred living, condo, trailer, etc.)? Apartment      Is it one or more stories?  One Story      How many persons live in your home ? 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  No                            Type & how often       ADVANCED DIRECTIVES (Please bring copies)      Do you have a living will? Yes      Do you have a DNR form? Yes                      If not, do you want to discuss one?       Do you have signed POA?HPOA forms?  Yes               If so, please bring to your appointment      FUNCTIONAL STATUS- To be completed by Spouse / child / Staff       Do you have difficulty bathing or dressing yourself ? No   Do you have difficulty preparing food or eating ? No      Do you have difficulty managing your mediation ? No      Do you have difficulty managing your finances ? No      Do you have difficulty affording your medication ?  No      Social Drivers of Corporate investment banker Strain: Not on file  Food Insecurity: Not on file  Transportation Needs: Not on file  Physical Activity: Not on file  Stress: Not on file  Social Connections: Unknown (05/27/2021)   [facility]   Social Network    Social Network: Not on file    Tobacco Counseling Counseling given: Not Answered   Clinical  Intake:  Pre-visit preparation completed: Yes  Pain : No/denies pain Pain Score: 2      BMI - recorded: 20.52 Nutritional Status: BMI of 19-24  Normal Nutritional Risks: Unintentional weight loss Diabetes: No  How often do you need to have someone help you when you read instructions, pamphlets, or other written materials from your doctor or pharmacy?: 2 - Rarely What is the last grade level you completed in school?: college  Interpreter Needed?: No  Information entered by :: Hermenia Fritcher Lorenda Hark NP   Activities of Daily Living    08/26/2023    1:27 PM  In your present state of health, do you have any difficulty performing the following activities:  Hearing? 0  Vision? 1  Difficulty concentrating or making decisions? 1  Walking or climbing stairs? 1  Dressing or bathing? 1  Doing errands, shopping? 1  Preparing Food and eating ? N  Using the Toilet? N  In the past six months, have you accidently leaked urine? Y  Do you have problems with loss of bowel control? N  Managing your Medications? Y  Managing your Finances? Y  Housekeeping or managing your Housekeeping? Y    Patient Care Team: Hermenia Fritcher X, NP as PCP - General (Internal Medicine) Devonna Sieving, MD as Consulting Physician (Gynecologic Oncology) Cloretta Arley NOVAK, MD as Consulting Physician (Oncology) Luis Purchase, MD as Consulting Physician (Gastroenterology) Octavia Charleston, MD as Consulting Physician (Ophthalmology) Cleotilde Fairy POUR, MD as Referring Physician (Neurology) Tat, Asberry RAMAN, DO as Consulting Physician (Neurology)  Indicate any recent Medical Services you may have received from other than [facility] providers in the past year (date may be approximate).     Assessment:   This is a routine wellness examination for Monti.  Hearing/Vision screen No results found.   Goals Addressed             This Visit's Progress    Functional Decline Minimized       Evidence-based guidance:  Assess  fall risk, including balance and gait impairment, muscle weakness, diminished vision or hearing, environmental hazards, effects of medication and dehydration.  Assess and review gait speed, decreased muscle strength or impaired functional mobility; consider use of validated tool if available.  Prepare patient for rehabilitation services to develop an individualized activity and exercise program as indicated, such as progressive resistance strengthening, balance and activity tolerance training or home exercise program.  Prevent falls with environmental adjustment; encourage compliance with exercise program, management of incontinence and adequate vitamin D  and calcium intake from food or supplements.  Promote gradual increase in intensity of activity and exercise as tolerated, such as duration, frequency, exercise repetition and sets and intensity.  Provide guidance and interventions aimed at fall prevention.  Review or assess presence of reduced muscle mass or results of dual-energy x-ray absorptiometry.   Notes:        Depression Screen    04/18/2023    2:40 PM 01/23/2023    3:14 PM 09/19/2022    1:50 PM 04/10/2022    2:22 PM  12/28/2020    5:04 PM 09/14/2020    4:20 PM 05/04/2020    3:26 PM  PHQ 2/9 Scores  PHQ - 2 Score 0 0 0 0 0 0 0  PHQ- 9 Score   0        Fall Risk    07/31/2023    1:01 PM 04/18/2023    2:40 PM 04/18/2023    8:59 AM 01/23/2023    3:14 PM 10/17/2022    2:30 PM  Fall Risk   Falls in the past year? 1 0 0 0 0  Number falls in past yr: 0 0 0 0 0  Injury with Fall? 0 0 0 0 0  Comment hit right knee      Risk for fall due to : No Fall Risks No Fall Risks No Fall Risks  No Fall Risks  Follow up Falls evaluation completed Falls evaluation completed Falls evaluation completed  Falls evaluation completed    MEDICARE RISK AT HOME: Medicare Risk at Home Any stairs in or around the home?: Yes If so, are there any without handrails?: No Home free of loose throw rugs in walkways,  pet beds, electrical cords, etc?: Yes Adequate lighting in your home to reduce risk of falls?: Yes Life alert?: Yes Use of a cane, walker or w/c?: Yes Grab bars in the bathroom?: Yes Shower chair or bench in shower?: Yes Elevated toilet seat or a handicapped toilet?: Yes  TIMED UP AND GO:  Was the test performed?  Yes  Length of time to ambulate 10 feet: 8 sec Gait slow and steady with assistive device    Cognitive Function:    02/07/2022    1:21 PM 09/14/2020    4:20 PM  MMSE - Mini Mental State Exam  Orientation to time 5 5  Orientation to Place 5 5  Registration 3 3  Attention/ Calculation 5 5  Recall 3 3  Language- name 2 objects 2 2  Language- repeat 1 1  Language- follow 3 step command 3 3  Language- read & follow direction 1 1  Write a sentence 1 1  Copy design 1 1  Total score 30 30        Immunizations Immunization History  Administered Date(s) Administered   Fluad Quad(high Dose 65+) 11/03/2017, 10/30/2020   Influenza Split 10/24/2008, 09/26/2009, 10/08/2011, 10/05/2012   Influenza, High Dose Seasonal PF 11/02/2013, 11/02/2013, 11/03/2017, 10/26/2018, 10/27/2019, 11/13/2022   Influenza, Quadrivalent, Recombinant, Inj, Pf 10/11/2016   Influenza,inj,Quad PF,6+ Mos 11/01/2014, 10/11/2015, 10/11/2016   Influenza-Unspecified 10/24/2008, 09/26/2009, 10/08/2011, 10/05/2012, 11/02/2013, 10/11/2016, 11/05/2021   Moderna Covid-19 Vaccine Bivalent Booster 51yrs & up 11/13/2022   Moderna SARS-COV2 Booster Vaccination 06/13/2020   Moderna Sars-Covid-2 Vaccination 01/18/2019, 02/15/2019, 11/23/2019   Pfizer Covid-19 Vaccine Bivalent Booster 62yrs & up 11/15/2021   Pneumococcal Conjugate-13 08/20/2011   Pneumococcal Polysaccharide-23 01/14/2005, 06/29/2014   Pneumococcal-Unspecified 01/14/2005   Tdap 08/20/2011, 04/18/2023   Zoster Recombinant(Shingrix) 11/09/2018, 12/01/2018, 03/01/2019   Zoster, Live 10/08/2011    TDAP status: Up to date  Flu Vaccine status: Up to  date  Pneumococcal vaccine status: Up to date  Covid-19 vaccine status: Information provided on how to obtain vaccines.   Qualifies for Shingles Vaccine? Yes   Zostavax completed Yes   Shingrix Completed?: Yes  Screening Tests Health Maintenance  Topic Date Due   COVID-19 Vaccine (6 - 2024-25 season) 08/30/2023 (Originally 01/08/2023)   INFLUENZA VACCINE  09/15/2023 (Originally 08/15/2023)   Medicare Annual Wellness (AWV)  08/25/2024  DTaP/Tdap/Td (3 - Td or Tdap) 04/17/2033   Pneumococcal Vaccine: 50+ Years  Completed   DEXA SCAN  Completed   Zoster Vaccines- Shingrix  Completed   Hepatitis B Vaccines  Aged Out   HPV VACCINES  Aged Out   Meningococcal B Vaccine  Aged Out    Health Maintenance  There are no preventive care reminders to display for this patient.   Colorectal cancer screening: No longer required.   Mammogram status: No longer required due to aged out.  Bone Density status: Ordered 08/26/23. Pt provided with contact info and advised to call to schedule appt.  Lung Cancer Screening: (Low Dose CT Chest recommended if Age 55-80 years, 20 pack-year currently smoking OR have quit w/in 15years.) does not qualify.   Lung Cancer Screening Referral: NA   Additional Screening:  Hepatitis C Screening: does not qualify;   Vision Screening: Recommended annual ophthalmology exams for early detection of glaucoma and other disorders of the eye. Is the patient up to date with their annual eye exam?  Yes  Who is the provider or what is the name of the office in which the patient attends annual eye exams? The patient will provide If pt is not established with a provider, would they like to be referred to a provider to establish care? No .   Dental Screening: Recommended annual dental exams for proper oral hygiene  Diabetic Foot Exam: NA  Community Resource Referral / Chronic Care Management: CRR required this visit?  No   CCM required this visit?  No     Plan:      I have personally reviewed and noted the following in the patient's chart:   Medical and social history Use of alcohol, tobacco or illicit drugs  Current medications and supplements including opioid prescriptions. Patient is not currently taking opioid prescriptions. Functional ability and status Nutritional status Physical activity Advanced directives List of other physicians Hospitalizations, surgeries, and ER visits in previous 12 months Vitals Screenings to include cognitive, depression, and falls Referrals and appointments  In addition, I have reviewed and discussed with patient certain preventive protocols, quality metrics, and best practice recommendations. A written personalized care plan for preventive services as well as general preventive health recommendations were provided to patient.     Gilbert Narain X Tyronne Blann, NP   08/26/2023   After Visit Summary: (In Person-Declined) Patient declined AVS at this time.

## 2023-09-17 DIAGNOSIS — M47812 Spondylosis without myelopathy or radiculopathy, cervical region: Secondary | ICD-10-CM | POA: Diagnosis not present

## 2023-09-22 DIAGNOSIS — M6281 Muscle weakness (generalized): Secondary | ICD-10-CM | POA: Diagnosis not present

## 2023-09-22 DIAGNOSIS — R2681 Unsteadiness on feet: Secondary | ICD-10-CM | POA: Diagnosis not present

## 2023-09-30 DIAGNOSIS — M6281 Muscle weakness (generalized): Secondary | ICD-10-CM | POA: Diagnosis not present

## 2023-09-30 DIAGNOSIS — R110 Nausea: Secondary | ICD-10-CM | POA: Diagnosis not present

## 2023-09-30 DIAGNOSIS — R109 Unspecified abdominal pain: Secondary | ICD-10-CM | POA: Diagnosis not present

## 2023-10-01 ENCOUNTER — Inpatient Hospital Stay (HOSPITAL_COMMUNITY)
Admission: EM | Admit: 2023-10-01 | Discharge: 2023-10-27 | DRG: 329 | Disposition: A | Discharge status: Skilled Nursing Facility | Source: Skilled Nursing Facility | Attending: Family Medicine | Admitting: Family Medicine

## 2023-10-01 ENCOUNTER — Encounter (HOSPITAL_COMMUNITY): Payer: Self-pay

## 2023-10-01 ENCOUNTER — Other Ambulatory Visit: Payer: Self-pay

## 2023-10-01 ENCOUNTER — Non-Acute Institutional Stay: Payer: Self-pay | Admitting: Orthopedic Surgery

## 2023-10-01 ENCOUNTER — Emergency Department (HOSPITAL_COMMUNITY)

## 2023-10-01 ENCOUNTER — Encounter: Payer: Self-pay | Admitting: Orthopedic Surgery

## 2023-10-01 DIAGNOSIS — I700 Atherosclerosis of aorta: Secondary | ICD-10-CM | POA: Diagnosis present

## 2023-10-01 DIAGNOSIS — R1030 Lower abdominal pain, unspecified: Secondary | ICD-10-CM | POA: Diagnosis not present

## 2023-10-01 DIAGNOSIS — J90 Pleural effusion, not elsewhere classified: Secondary | ICD-10-CM | POA: Diagnosis present

## 2023-10-01 DIAGNOSIS — D62 Acute posthemorrhagic anemia: Secondary | ICD-10-CM | POA: Diagnosis not present

## 2023-10-01 DIAGNOSIS — N739 Female pelvic inflammatory disease, unspecified: Secondary | ICD-10-CM | POA: Diagnosis not present

## 2023-10-01 DIAGNOSIS — I10 Essential (primary) hypertension: Secondary | ICD-10-CM | POA: Diagnosis present

## 2023-10-01 DIAGNOSIS — Z452 Encounter for adjustment and management of vascular access device: Secondary | ICD-10-CM | POA: Diagnosis not present

## 2023-10-01 DIAGNOSIS — Z7982 Long term (current) use of aspirin: Secondary | ICD-10-CM

## 2023-10-01 DIAGNOSIS — K56609 Unspecified intestinal obstruction, unspecified as to partial versus complete obstruction: Secondary | ICD-10-CM | POA: Diagnosis not present

## 2023-10-01 DIAGNOSIS — R300 Dysuria: Secondary | ICD-10-CM | POA: Diagnosis not present

## 2023-10-01 DIAGNOSIS — R339 Retention of urine, unspecified: Secondary | ICD-10-CM | POA: Diagnosis not present

## 2023-10-01 DIAGNOSIS — Z79899 Other long term (current) drug therapy: Secondary | ICD-10-CM

## 2023-10-01 DIAGNOSIS — Z888 Allergy status to other drugs, medicaments and biological substances status: Secondary | ICD-10-CM

## 2023-10-01 DIAGNOSIS — K565 Intestinal adhesions [bands], unspecified as to partial versus complete obstruction: Secondary | ICD-10-CM | POA: Diagnosis not present

## 2023-10-01 DIAGNOSIS — I9581 Postprocedural hypotension: Secondary | ICD-10-CM | POA: Diagnosis not present

## 2023-10-01 DIAGNOSIS — R569 Unspecified convulsions: Secondary | ICD-10-CM | POA: Diagnosis present

## 2023-10-01 DIAGNOSIS — I2489 Other forms of acute ischemic heart disease: Secondary | ICD-10-CM | POA: Diagnosis present

## 2023-10-01 DIAGNOSIS — D509 Iron deficiency anemia, unspecified: Secondary | ICD-10-CM | POA: Diagnosis not present

## 2023-10-01 DIAGNOSIS — Z8249 Family history of ischemic heart disease and other diseases of the circulatory system: Secondary | ICD-10-CM

## 2023-10-01 DIAGNOSIS — K567 Ileus, unspecified: Secondary | ICD-10-CM | POA: Insufficient documentation

## 2023-10-01 DIAGNOSIS — R609 Edema, unspecified: Secondary | ICD-10-CM | POA: Diagnosis present

## 2023-10-01 DIAGNOSIS — F319 Bipolar disorder, unspecified: Secondary | ICD-10-CM | POA: Insufficient documentation

## 2023-10-01 DIAGNOSIS — Z5331 Laparoscopic surgical procedure converted to open procedure: Secondary | ICD-10-CM

## 2023-10-01 DIAGNOSIS — G8929 Other chronic pain: Secondary | ICD-10-CM | POA: Diagnosis present

## 2023-10-01 DIAGNOSIS — J9811 Atelectasis: Secondary | ICD-10-CM | POA: Diagnosis not present

## 2023-10-01 DIAGNOSIS — K5651 Intestinal adhesions [bands], with partial obstruction: Secondary | ICD-10-CM | POA: Diagnosis not present

## 2023-10-01 DIAGNOSIS — C49A3 Gastrointestinal stromal tumor of small intestine: Secondary | ICD-10-CM | POA: Diagnosis not present

## 2023-10-01 DIAGNOSIS — K5669 Other partial intestinal obstruction: Secondary | ICD-10-CM | POA: Diagnosis not present

## 2023-10-01 DIAGNOSIS — H409 Unspecified glaucoma: Secondary | ICD-10-CM | POA: Diagnosis present

## 2023-10-01 DIAGNOSIS — I4891 Unspecified atrial fibrillation: Secondary | ICD-10-CM | POA: Insufficient documentation

## 2023-10-01 DIAGNOSIS — F0284 Dementia in other diseases classified elsewhere, unspecified severity, with anxiety: Secondary | ICD-10-CM | POA: Diagnosis present

## 2023-10-01 DIAGNOSIS — I959 Hypotension, unspecified: Secondary | ICD-10-CM | POA: Diagnosis not present

## 2023-10-01 DIAGNOSIS — E876 Hypokalemia: Secondary | ICD-10-CM | POA: Diagnosis present

## 2023-10-01 DIAGNOSIS — E870 Hyperosmolality and hypernatremia: Secondary | ICD-10-CM | POA: Diagnosis present

## 2023-10-01 DIAGNOSIS — H9193 Unspecified hearing loss, bilateral: Secondary | ICD-10-CM | POA: Diagnosis not present

## 2023-10-01 DIAGNOSIS — R110 Nausea: Secondary | ICD-10-CM

## 2023-10-01 DIAGNOSIS — R932 Abnormal findings on diagnostic imaging of liver and biliary tract: Secondary | ICD-10-CM | POA: Diagnosis not present

## 2023-10-01 DIAGNOSIS — R5383 Other fatigue: Secondary | ICD-10-CM | POA: Diagnosis not present

## 2023-10-01 DIAGNOSIS — R140 Abdominal distension (gaseous): Secondary | ICD-10-CM | POA: Diagnosis not present

## 2023-10-01 DIAGNOSIS — Z85831 Personal history of malignant neoplasm of soft tissue: Secondary | ICD-10-CM

## 2023-10-01 DIAGNOSIS — R54 Age-related physical debility: Secondary | ICD-10-CM | POA: Diagnosis present

## 2023-10-01 DIAGNOSIS — G20A1 Parkinson's disease without dyskinesia, without mention of fluctuations: Secondary | ICD-10-CM | POA: Diagnosis present

## 2023-10-01 DIAGNOSIS — R911 Solitary pulmonary nodule: Secondary | ICD-10-CM | POA: Diagnosis present

## 2023-10-01 DIAGNOSIS — I129 Hypertensive chronic kidney disease with stage 1 through stage 4 chronic kidney disease, or unspecified chronic kidney disease: Secondary | ICD-10-CM | POA: Diagnosis not present

## 2023-10-01 DIAGNOSIS — I4819 Other persistent atrial fibrillation: Secondary | ICD-10-CM | POA: Diagnosis not present

## 2023-10-01 DIAGNOSIS — Z66 Do not resuscitate: Secondary | ICD-10-CM | POA: Diagnosis not present

## 2023-10-01 DIAGNOSIS — Z9841 Cataract extraction status, right eye: Secondary | ICD-10-CM

## 2023-10-01 DIAGNOSIS — Z90710 Acquired absence of both cervix and uterus: Secondary | ICD-10-CM

## 2023-10-01 DIAGNOSIS — K651 Peritoneal abscess: Secondary | ICD-10-CM | POA: Diagnosis not present

## 2023-10-01 DIAGNOSIS — Z880 Allergy status to penicillin: Secondary | ICD-10-CM

## 2023-10-01 DIAGNOSIS — R4189 Other symptoms and signs involving cognitive functions and awareness: Secondary | ICD-10-CM

## 2023-10-01 DIAGNOSIS — K5900 Constipation, unspecified: Secondary | ICD-10-CM | POA: Diagnosis not present

## 2023-10-01 DIAGNOSIS — Z9049 Acquired absence of other specified parts of digestive tract: Secondary | ICD-10-CM

## 2023-10-01 DIAGNOSIS — Z860100 Personal history of colon polyps, unspecified: Secondary | ICD-10-CM

## 2023-10-01 DIAGNOSIS — J984 Other disorders of lung: Secondary | ICD-10-CM | POA: Diagnosis not present

## 2023-10-01 DIAGNOSIS — Z91048 Other nonmedicinal substance allergy status: Secondary | ICD-10-CM

## 2023-10-01 DIAGNOSIS — H9202 Otalgia, left ear: Secondary | ICD-10-CM | POA: Diagnosis not present

## 2023-10-01 DIAGNOSIS — Z7401 Bed confinement status: Secondary | ICD-10-CM | POA: Diagnosis not present

## 2023-10-01 DIAGNOSIS — N183 Chronic kidney disease, stage 3 unspecified: Secondary | ICD-10-CM | POA: Diagnosis not present

## 2023-10-01 DIAGNOSIS — R112 Nausea with vomiting, unspecified: Secondary | ICD-10-CM | POA: Diagnosis not present

## 2023-10-01 DIAGNOSIS — F05 Delirium due to known physiological condition: Secondary | ICD-10-CM | POA: Diagnosis present

## 2023-10-01 DIAGNOSIS — Z4682 Encounter for fitting and adjustment of non-vascular catheter: Secondary | ICD-10-CM | POA: Diagnosis not present

## 2023-10-01 DIAGNOSIS — K219 Gastro-esophageal reflux disease without esophagitis: Secondary | ICD-10-CM | POA: Diagnosis not present

## 2023-10-01 DIAGNOSIS — J479 Bronchiectasis, uncomplicated: Secondary | ICD-10-CM | POA: Diagnosis not present

## 2023-10-01 DIAGNOSIS — M542 Cervicalgia: Secondary | ICD-10-CM | POA: Diagnosis present

## 2023-10-01 DIAGNOSIS — F0390 Unspecified dementia without behavioral disturbance: Secondary | ICD-10-CM | POA: Diagnosis present

## 2023-10-01 DIAGNOSIS — R1111 Vomiting without nausea: Secondary | ICD-10-CM | POA: Diagnosis not present

## 2023-10-01 DIAGNOSIS — B964 Proteus (mirabilis) (morganii) as the cause of diseases classified elsewhere: Secondary | ICD-10-CM | POA: Diagnosis not present

## 2023-10-01 DIAGNOSIS — I480 Paroxysmal atrial fibrillation: Secondary | ICD-10-CM | POA: Diagnosis not present

## 2023-10-01 DIAGNOSIS — R918 Other nonspecific abnormal finding of lung field: Secondary | ICD-10-CM | POA: Diagnosis not present

## 2023-10-01 DIAGNOSIS — E860 Dehydration: Secondary | ICD-10-CM | POA: Diagnosis present

## 2023-10-01 DIAGNOSIS — N736 Female pelvic peritoneal adhesions (postinfective): Secondary | ICD-10-CM | POA: Diagnosis present

## 2023-10-01 DIAGNOSIS — K449 Diaphragmatic hernia without obstruction or gangrene: Secondary | ICD-10-CM | POA: Diagnosis not present

## 2023-10-01 DIAGNOSIS — Z885 Allergy status to narcotic agent status: Secondary | ICD-10-CM

## 2023-10-01 DIAGNOSIS — K6389 Other specified diseases of intestine: Secondary | ICD-10-CM | POA: Diagnosis not present

## 2023-10-01 DIAGNOSIS — Z9842 Cataract extraction status, left eye: Secondary | ICD-10-CM

## 2023-10-01 DIAGNOSIS — E43 Unspecified severe protein-calorie malnutrition: Secondary | ICD-10-CM | POA: Diagnosis present

## 2023-10-01 DIAGNOSIS — H9190 Unspecified hearing loss, unspecified ear: Secondary | ICD-10-CM

## 2023-10-01 DIAGNOSIS — K7689 Other specified diseases of liver: Secondary | ICD-10-CM | POA: Diagnosis not present

## 2023-10-01 DIAGNOSIS — K9189 Other postprocedural complications and disorders of digestive system: Secondary | ICD-10-CM | POA: Diagnosis not present

## 2023-10-01 DIAGNOSIS — G44209 Tension-type headache, unspecified, not intractable: Secondary | ICD-10-CM | POA: Diagnosis not present

## 2023-10-01 DIAGNOSIS — Z96651 Presence of right artificial knee joint: Secondary | ICD-10-CM | POA: Diagnosis present

## 2023-10-01 DIAGNOSIS — Z6820 Body mass index (BMI) 20.0-20.9, adult: Secondary | ICD-10-CM

## 2023-10-01 DIAGNOSIS — N281 Cyst of kidney, acquired: Secondary | ICD-10-CM | POA: Diagnosis not present

## 2023-10-01 LAB — CBC WITH DIFFERENTIAL/PLATELET
Abs Immature Granulocytes: 0 K/uL (ref 0.00–0.07)
Basophils Absolute: 0 K/uL (ref 0.0–0.1)
Basophils Relative: 0 %
Eosinophils Absolute: 0 K/uL (ref 0.0–0.5)
Eosinophils Relative: 0 %
HCT: 41.4 % (ref 36.0–46.0)
Hemoglobin: 13.2 g/dL (ref 12.0–15.0)
Immature Granulocytes: 0 %
Lymphocytes Relative: 16 %
Lymphs Abs: 0.5 K/uL — ABNORMAL LOW (ref 0.7–4.0)
MCH: 32.2 pg (ref 26.0–34.0)
MCHC: 31.9 g/dL (ref 30.0–36.0)
MCV: 101 fL — ABNORMAL HIGH (ref 80.0–100.0)
Monocytes Absolute: 0.5 K/uL (ref 0.1–1.0)
Monocytes Relative: 15 %
Neutro Abs: 2.4 K/uL (ref 1.7–7.7)
Neutrophils Relative %: 69 %
Platelets: 336 K/uL (ref 150–400)
RBC: 4.1 MIL/uL (ref 3.87–5.11)
RDW: 12.9 % (ref 11.5–15.5)
Smear Review: NORMAL
WBC: 3.4 K/uL — ABNORMAL LOW (ref 4.0–10.5)
nRBC: 0 % (ref 0.0–0.2)

## 2023-10-01 LAB — COMPREHENSIVE METABOLIC PANEL WITH GFR
ALT: 10 U/L (ref 0–44)
AST: 22 U/L (ref 15–41)
Albumin: 4.4 g/dL (ref 3.5–5.0)
Alkaline Phosphatase: 82 U/L (ref 38–126)
Anion gap: 16 — ABNORMAL HIGH (ref 5–15)
BUN: 36 mg/dL — ABNORMAL HIGH (ref 8–23)
CO2: 21 mmol/L — ABNORMAL LOW (ref 22–32)
Calcium: 11 mg/dL — ABNORMAL HIGH (ref 8.9–10.3)
Chloride: 99 mmol/L (ref 98–111)
Creatinine, Ser: 1.06 mg/dL — ABNORMAL HIGH (ref 0.44–1.00)
GFR, Estimated: 52 mL/min — ABNORMAL LOW (ref >60)
Glucose, Bld: 150 mg/dL — ABNORMAL HIGH (ref 70–99)
Potassium: 4.4 mmol/L (ref 3.5–5.1)
Sodium: 136 mmol/L (ref 135–145)
Total Bilirubin: 1.1 mg/dL (ref 0.0–1.2)
Total Protein: 7.2 g/dL (ref 6.5–8.1)

## 2023-10-01 MED ORDER — ACETAMINOPHEN 10 MG/ML IV SOLN
500.0000 mg | Freq: Four times a day (QID) | INTRAVENOUS | Status: AC | PRN
  Administered 2023-10-02 (×2): 500 mg via INTRAVENOUS
  Filled 2023-10-01 (×3): qty 100

## 2023-10-01 MED ORDER — FENTANYL CITRATE PF 50 MCG/ML IJ SOSY
50.0000 ug | PREFILLED_SYRINGE | INTRAMUSCULAR | Status: AC | PRN
  Administered 2023-10-01 – 2023-10-02 (×3): 50 ug via INTRAVENOUS
  Filled 2023-10-01 (×3): qty 1

## 2023-10-01 MED ORDER — ONDANSETRON HCL 4 MG PO TABS: 4.0000 mg | ORAL_TABLET | Freq: Four times a day (QID) | ORAL | Status: DC | PRN

## 2023-10-01 MED ORDER — ONDANSETRON HCL 4 MG/2ML IJ SOLN
4.0000 mg | Freq: Once | INTRAMUSCULAR | Status: AC
  Administered 2023-10-01: 4 mg via INTRAVENOUS
  Filled 2023-10-01: qty 2

## 2023-10-01 MED ORDER — ONDANSETRON HCL 4 MG/2ML IJ SOLN
4.0000 mg | Freq: Four times a day (QID) | INTRAMUSCULAR | Status: DC | PRN
  Administered 2023-10-02 – 2023-10-18 (×10): 4 mg via INTRAVENOUS
  Filled 2023-10-01 (×10): qty 2

## 2023-10-01 MED ORDER — SODIUM CHLORIDE 0.9 % IV SOLN
12.5000 mg | Freq: Once | INTRAVENOUS | Status: AC
  Administered 2023-10-01: 12.5 mg via INTRAVENOUS
  Filled 2023-10-01: qty 12.5

## 2023-10-01 MED ORDER — LATANOPROST 0.005 % OP SOLN
1.0000 [drp] | Freq: Every day | OPHTHALMIC | Status: DC
Start: 2023-10-02 — End: 2023-10-28
  Administered 2023-10-02 – 2023-10-27 (×26): 1 [drp] via OPHTHALMIC
  Filled 2023-10-01 (×2): qty 2.5

## 2023-10-01 MED ORDER — LACTATED RINGERS IV BOLUS
1000.0000 mL | Freq: Once | INTRAVENOUS | Status: AC
  Administered 2023-10-01: 1000 mL via INTRAVENOUS

## 2023-10-01 MED ORDER — LACTATED RINGERS IV SOLN: INTRAVENOUS | Status: AC

## 2023-10-01 MED ORDER — IOHEXOL 300 MG/ML  SOLN
80.0000 mL | Freq: Once | INTRAMUSCULAR | Status: AC | PRN
  Administered 2023-10-01: 80 mL via INTRAVENOUS

## 2023-10-01 NOTE — Consult Note (Signed)
 CC: abd pain  Requesting provider: Dr Charlyn  HPI: Tracey [name] is an 83 y.o. female who is here for worsening abd pain, nausea, vomiting and lack of bowel function over the last few days.  She has a h/o malignant GIST s/p bilateral oophorectomy, small bowel resection and separate ileocolic resection in 2009 by Dr Devonna.  H/o hysterectomy in 1986.  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, possible    Tremors possible parkinsons   Manic disorder, single episode, in full remission (HCC) 12/01/2009   Sixth nerve palsy     Past Surgical History:  Procedure Laterality Date   BILATERAL SALPINGOOPHORECTOMY  09/22/2007   CATARACT EXTRACTION Bilateral    ESOPHAGOGASTRODUODENOSCOPY (EGD) WITH PROPOFOL  N/A 03/25/2022   Procedure: ESOPHAGOGASTRODUODENOSCOPY (EGD) WITH PROPOFOL ;  Surgeon: Legrand Victory LITTIE DOUGLAS, MD;  Location: [facility];  Service: Gastroenterology;  Laterality: N/A;   Gastrointestinal Stroma Tumor,  Other  1960   GIST Surgery   ILEOCECETOMY  09/22/2007   OVARIAN CYST REMOVAL Right 1967   SMALL INTESTINE SURGERY  09/22/2007   TOTAL KNEE ARTHROPLASTY Right 10/05/2019   Procedure: RIGHT TOTAL KNEE ARTHROPLASTY;  Surgeon: Addie Cordella Hamilton, MD;  Location: [facility] OR;  Service: Orthopedics;  Laterality: Right;   TOTAL VAGINAL HYSTERECTOMY  1986   Fibroids    Family History  Problem Relation Age of Onset   Congestive  Heart Failure Mother    Dementia Mother    Heart disease Father    Diabetes Father    Lung cancer Son    Liver disease Neg Hx    Esophageal cancer Neg Hx    Colon cancer Neg Hx     Social:  reports that she has never smoked. She has never used smokeless tobacco. She reports that she does not drink alcohol  and does not use drugs.  Allergies:  Allergies  Allergen Reactions   Lisinopril Cough   Penicillins Itching    50 years ago   Adhesive [Tape] Itching   Atorvastatin Itching   Dilaudid  [Hydromorphone  Hcl] Itching   Hydromorphone  Itching    Medications: I have reviewed the patient's current medications.  Results for orders placed or performed during the hospital encounter of 10/01/23 (from the past 48 hours)  Comprehensive metabolic panel     Status: Abnormal   Collection Time: 10/01/23  7:22 PM  Result Value Ref Range   Sodium 136 135 - 145 mmol/L   Potassium 4.4 3.5 - 5.1 mmol/L   Chloride 99 98 - 111 mmol/L   CO2 21 (L) 22 - 32 mmol/L   Glucose, Bld 150 (H) 70 - 99 mg/dL    Comment: Glucose reference range applies only to samples taken after fasting for at least 8 hours.   BUN 36 (H) 8 - 23 mg/dL   Creatinine, Ser 8.93 (H) 0.44 - 1.00 mg/dL   Calcium 88.9 (H)  8.9 - 10.3 mg/dL   Total Protein 7.2 6.5 - 8.1 g/dL   Albumin 4.4 3.5 - 5.0 g/dL   AST 22 15 - 41 U/L   ALT 10 0 - 44 U/L   Alkaline Phosphatase 82 38 - 126 U/L   Total Bilirubin 1.1 0.0 - 1.2 mg/dL   GFR, Estimated 52 (L) >60 mL/min    Comment: (NOTE) Calculated using the CKD-EPI Creatinine Equation (2021)    Anion gap 16 (H) 5 - 15    Comment: Performed at [facility], [address], [place], [place] [address]  CBC with Differential     Status: Abnormal   Collection Time: 10/01/23  7:22 PM  Result Value Ref Range   WBC 3.4 (L) 4.0 - 10.5 K/uL   RBC 4.10 3.87 - 5.11 MIL/uL   Hemoglobin 13.2 12.0 - 15.0 g/dL   HCT 58.5 63.9 - 53.9 %   MCV 101.0 (H) 80.0 - 100.0 fL   MCH 32.2 26.0  - 34.0 pg   MCHC 31.9 30.0 - 36.0 g/dL   RDW 87.0 88.4 - 84.4 %   Platelets 336 150 - 400 K/uL   nRBC 0.0 0.0 - 0.2 %   Neutrophils Relative % 69 %   Neutro Abs 2.4 1.7 - 7.7 K/uL   Lymphocytes Relative 16 %   Lymphs Abs 0.5 (L) 0.7 - 4.0 K/uL   Monocytes Relative 15 %   Monocytes Absolute 0.5 0.1 - 1.0 K/uL   Eosinophils Relative 0 %   Eosinophils Absolute 0.0 0.0 - 0.5 K/uL   Basophils Relative 0 %   Basophils Absolute 0.0 0.0 - 0.1 K/uL   WBC Morphology See Note     Comment: INCREASED BANDS (>20% BANDS)   RBC Morphology MORPHOLOGY UNREMARKABLE    Smear Review Normal platelet morphology    Immature Granulocytes 0 %   Abs Immature Granulocytes 0.00 0.00 - 0.07 K/uL    Comment: Performed at [facility], [address] [place], [place] [address]    CT ABDOMEN PELVIS W CONTRAST Result Date: 10/01/2023 CLINICAL DATA:  Provided history: Bowel obstruction suspected Technologist notes state constipation for 3 days with poor oral intake. Vomiting. EXAM: CT ABDOMEN AND PELVIS WITH CONTRAST TECHNIQUE: Multidetector CT imaging of the abdomen and pelvis was performed using the standard protocol following bolus administration of intravenous contrast. RADIATION DOSE REDUCTION: This exam was performed according to the departmental dose-optimization program which includes automated exposure control, adjustment of the mA and/or kV according to patient size and/or use of iterative reconstruction technique. CONTRAST:  80mL OMNIPAQUE  IOHEXOL  300 MG/ML  SOLN COMPARISON:  09/18/2013 FINDINGS: Lower chest: Chronic scarring in the right middle lobe and lingula. Lobe segmental atelectasis or scarring in the periphery of the left lower lobe. No pleural effusion. Circumferential wall thickening of the distal esophagus. Small hiatal hernia. Hepatobiliary: Scattered tiny hepatic hypodensities, too small to characterize but likely cysts. Gallbladder physiologically distended, no calcified stone. No  biliary dilatation. Pancreas: Parenchymal atrophy. No ductal dilatation or inflammation. Spleen: Normal in size without focal abnormality. Adrenals/Urinary Tract: No adrenal nodule. No hydronephrosis or renal inflammation. Multiple bilateral renal cysts and low-density lesions too small to characterize. Homogeneous enhancement with symmetric excretion on delayed phase imaging. The urinary bladder is not well-defined on the current exam, may be completely decompressed Stomach/Bowel: Wall thickening of the distal esophagus. Small to moderate hiatal hernia. Fluid distention of the stomach as well as fluid within the hiatal hernia. Dilated fluid-filled small  bowel. This includes marked small bowel distention in the pelvis up to 5.3 cm. Fecalization of small bowel contents with transition point in the pelvis to the right of midline, series 2, image 61 and series 10, image 82. Mild mesenteric edema. Small to moderate amount of free fluid. No bowel pneumatosis or free air. There are enteric sutures within the small bowel in the pelvis is well as right colon. Small volume of formed colonic stool. Vascular/Lymphatic: Aortic atherosclerosis. No aneurysm. Patent portal, splenic, and mesenteric veins. Calcified mesenteric lymph nodes in the right lower quadrant. No suspicious noncalcified adenopathy. Reproductive: Status post hysterectomy. No adnexal masses. Other: Small to moderate free fluid, lower abdomen pelvis predominant. No free air. No focal fluid collection. 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 Lumbar scoliosis. There are no acute or suspicious osseous abnormalities. IMPRESSION: 1. High-grade small bowel obstruction with transition point in the pelvis to the right of midline. Mild mesenteric edema and small to moderate free fluid. 2. Small to moderate hiatal hernia. Circumferential wall thickening of the distal esophagus, can be seen with reflux or esophagitis.  3. 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 Aortic Atherosclerosis (ICD10-I70.0). Electronically Signed   By: Andrea Gasman M.D.   On: 10/01/2023 20:57    ROS - all of the below systems have been reviewed with the patient and positives are indicated with bold text General: chills, fever or night sweats Eyes: blurry vision or double vision ENT: epistaxis or sore throat Hematologic/Lymphatic: bleeding problems, blood clots or swollen lymph nodes Endocrine: temperature intolerance or unexpected weight changes Resp: cough, shortness of breath, or wheezing CV: chest pain or dyspnea on exertion GI: as per HPI GU: dysuria, trouble voiding, or hematuria Neuro: TIA or stroke symptoms    PE Blood pressure (!) 143/64, pulse (!) 56, temperature 97.8 F (36.6 C), temperature source Oral, resp. rate 18, SpO2 100%. Constitutional: NAD; conversant; no deformities Eyes: Moist conjunctiva; no lid lag; anicteric; PERRL Neck: Trachea midline; no thyromegaly Lungs: Normal respiratory effort CV: RRR GI: Abd soft, distended MSK: Normal range of motion of extremities; no clubbing/cyanosis Psychiatric: Appropriate affect; alert and oriented x3  Results for orders placed or performed during the hospital encounter of 10/01/23 (from the past 48 hours)  Comprehensive metabolic panel     Status: Abnormal   Collection Time: 10/01/23  7:22 PM  Result Value Ref Range   Sodium 136 135 - 145 mmol/L   Potassium 4.4 3.5 - 5.1 mmol/L   Chloride 99 98 - 111 mmol/L   CO2 21 (L) 22 - 32 mmol/L   Glucose, Bld 150 (H) 70 - 99 mg/dL    Comment: Glucose reference range applies only to samples taken after fasting for at least 8 hours.   BUN 36 (H) 8 - 23 mg/dL   Creatinine, Ser 8.93 (H) 0.44 - 1.00 mg/dL   Calcium 88.9 (H) 8.9 - 10.3 mg/dL   Total Protein 7.2 6.5 - 8.1 g/dL   Albumin 4.4 3.5 - 5.0 g/dL   AST 22 15 - 41 U/L   ALT 10 0 - 44 U/L   Alkaline Phosphatase 82 38 - 126  U/L   Total Bilirubin 1.1 0.0 - 1.2 mg/dL   GFR, Estimated 52 (L) >60 mL/min    Comment: (NOTE) Calculated using the CKD-EPI Creatinine Equation (2021)    Anion gap 16 (H) 5 - 15    Comment: Performed at [facility], [address]  [place], [place] [address]  CBC with Differential     Status: Abnormal   Collection Time: 10/01/23  7:22 PM  Result Value Ref Range   WBC 3.4 (L) 4.0 - 10.5 K/uL   RBC 4.10 3.87 - 5.11 MIL/uL   Hemoglobin 13.2 12.0 - 15.0 g/dL   HCT 58.5 63.9 - 53.9 %   MCV 101.0 (H) 80.0 - 100.0 fL   MCH 32.2 26.0 - 34.0 pg   MCHC 31.9 30.0 - 36.0 g/dL   RDW 87.0 88.4 - 84.4 %   Platelets 336 150 - 400 K/uL   nRBC 0.0 0.0 - 0.2 %   Neutrophils Relative % 69 %   Neutro Abs 2.4 1.7 - 7.7 K/uL   Lymphocytes Relative 16 %   Lymphs Abs 0.5 (L) 0.7 - 4.0 K/uL   Monocytes Relative 15 %   Monocytes Absolute 0.5 0.1 - 1.0 K/uL   Eosinophils Relative 0 %   Eosinophils Absolute 0.0 0.0 - 0.5 K/uL   Basophils Relative 0 %   Basophils Absolute 0.0 0.0 - 0.1 K/uL   WBC Morphology See Note     Comment: INCREASED BANDS (>20% BANDS)   RBC Morphology MORPHOLOGY UNREMARKABLE    Smear Review Normal platelet morphology    Immature Granulocytes 0 %   Abs Immature Granulocytes 0.00 0.00 - 0.07 K/uL    Comment: Performed at [facility], [address] [place], [place] [address]    CT ABDOMEN PELVIS W CONTRAST Result Date: 10/01/2023 CLINICAL DATA:  Provided history: Bowel obstruction suspected Technologist notes state constipation for 3 days with poor oral intake. Vomiting. EXAM: CT ABDOMEN AND PELVIS WITH CONTRAST TECHNIQUE: Multidetector CT imaging of the abdomen and pelvis was performed using the standard protocol following bolus administration of intravenous contrast. RADIATION DOSE REDUCTION: This exam was performed according to the departmental dose-optimization program which includes automated exposure control, adjustment of the mA and/or  kV according to patient size and/or use of iterative reconstruction technique. CONTRAST:  80mL OMNIPAQUE  IOHEXOL  300 MG/ML  SOLN COMPARISON:  09/18/2013 FINDINGS: Lower chest: Chronic scarring in the right middle lobe and lingula. Lobe segmental atelectasis or scarring in the periphery of the left lower lobe. No pleural effusion. Circumferential wall thickening of the distal esophagus. Small hiatal hernia. Hepatobiliary: Scattered tiny hepatic hypodensities, too small to characterize but likely cysts. Gallbladder physiologically distended, no calcified stone. No biliary dilatation. Pancreas: Parenchymal atrophy. No ductal dilatation or inflammation. Spleen: Normal in size without focal abnormality. Adrenals/Urinary Tract: No adrenal nodule. No hydronephrosis or renal inflammation. Multiple bilateral renal cysts and low-density lesions too small to characterize. Homogeneous enhancement with symmetric excretion on delayed phase imaging. The urinary bladder is not well-defined on the current exam, may be completely decompressed Stomach/Bowel: Wall thickening of the distal esophagus. Small to moderate hiatal hernia. Fluid distention of the stomach as well as fluid within the hiatal hernia. Dilated fluid-filled small bowel. This includes marked small bowel distention in the pelvis up to 5.3 cm. Fecalization of small bowel contents with transition point in the pelvis to the right of midline, series 2, image 61 and series 10, image 82. Mild mesenteric edema. Small to moderate amount of free fluid. No bowel pneumatosis or free air. There are enteric sutures within the small bowel in the pelvis is well as right colon. Small volume of formed colonic stool. Vascular/Lymphatic: Aortic atherosclerosis. No aneurysm. Patent portal, splenic, and mesenteric veins. Calcified mesenteric lymph nodes in the right lower quadrant. No suspicious  noncalcified adenopathy. Reproductive: Status post hysterectomy. No adnexal masses. Other:  Small to moderate free fluid, lower abdomen pelvis predominant. No free air. No focal fluid collection. 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 Lumbar scoliosis. There are no acute or suspicious osseous abnormalities. IMPRESSION: 1. High-grade small bowel obstruction with transition point in the pelvis to the right of midline. Mild mesenteric edema and small to moderate free fluid. 2. Small to moderate hiatal hernia. Circumferential wall thickening of the distal esophagus, can be seen with reflux or esophagitis. 3. 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 Aortic Atherosclerosis (ICD10-I70.0). Electronically Signed   By: Andrea Gasman M.D.   On: 10/01/2023 20:57     A/P: Tracey [name] is an 83 y.o. female with multiple medical problems and high grade SBO most likely due to extensive pelvic surgery for small bowel GIST in 2009.  I have recommended admission to the hospital by the medical service, NPO and NG tube placement.  No needs for emergent surgery at this time.  We will follow up in the AM.    Bernarda JAYSON Ned, MD  Colorectal and General Surgery [facility] Surgery  moderate decision making.

## 2023-10-01 NOTE — ED Triage Notes (Signed)
 Pt BIB EMS from [facility] for poor oral intake and constipation x3 days. Pt was given Miraalax and Zofran  at the facility with no relief. Pt vomited earlier because she tried to drink an ensure.  142/90 60 HR 194 cbg  22g RAC

## 2023-10-01 NOTE — Progress Notes (Unsigned)
 Location:  [facility] Room Number: 812 A Place of Service:  ALF [phone]) Provider:  Gil Greig BRAVO, NP   Patient Care Team: Mast, Man X, NP as PCP - General (Internal Medicine) Devonna Sieving, MD as Consulting Physician (Gynecologic Oncology) Cloretta Arley NOVAK, MD as Consulting Physician (Oncology) Luis Purchase, MD as Consulting Physician (Gastroenterology) Octavia Charleston, MD as Consulting Physician (Ophthalmology) Cleotilde Fairy POUR, MD as Referring Physician (Neurology) Tat, Asberry RAMAN, DO as Consulting Physician (Neurology)  Extended Emergency Contact Information Primary Emergency Contact: Tracey Tracey [name] Address: [address]          [place], [place] [address] [place] Home Phone: [phone] Mobile Phone: [phone] Relation: Son Secondary Emergency Contact: Tracey Tracey [name] Numbers Home Phone: [phone] Mobile Phone: [phone] Relation: Relative  Code Status: Full Code Goals of care: Advanced Directive information    08/26/2023   10:31 AM  Advanced Directives  Does Patient Have a Medical Advance Directive? Yes  Type of Advance Directive Healthcare Power of Attorney  Does patient want to make changes to medical advance directive? No - Patient declined  Copy of Healthcare Power of Attorney in Chart? Yes - validated most recent copy scanned in chart (See row information)     Chief Complaint  Patient presents with   Acute Visit    Abdominal pain    HPI:  Pt is a 83 y.o. Tracey [name] seen today for acute visit due to abdominal pain.   She currently resides on the assisted living unit at [facility].   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 She reports worsening lower abdominal pain and nausea x 2 days. Lower back pain intermittent. Denies vomiting. Afebrile. She cannot recall last normal BM. Describes BM as loose or watery. KUB noted gas and  normal amount of stool scattered throughout colon. Vital stable.    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, possible    Tremors possible parkinsons   Manic disorder, single episode, in full remission (HCC) 12/01/2009   Sixth nerve palsy    Past Surgical History:  Procedure Laterality Date   BILATERAL SALPINGOOPHORECTOMY  09/22/2007   CATARACT EXTRACTION Bilateral    ESOPHAGOGASTRODUODENOSCOPY (EGD) WITH PROPOFOL  N/A 03/25/2022   Procedure: ESOPHAGOGASTRODUODENOSCOPY (EGD) WITH PROPOFOL ;  Surgeon: Legrand Victory LITTIE DOUGLAS, MD;  Location: [facility];  Service: Gastroenterology;  Laterality: N/A;   Gastrointestinal Stroma Tumor,  Other  1960   GIST Surgery   ILEOCECETOMY  09/22/2007   OVARIAN CYST REMOVAL Right 1967   SMALL INTESTINE SURGERY  09/22/2007   TOTAL KNEE ARTHROPLASTY Right 10/05/2019   Procedure: RIGHT TOTAL KNEE ARTHROPLASTY;  Surgeon: Addie Cordella Hamilton, MD;  Location: [facility];  Service: Orthopedics;  Laterality: Right;   TOTAL VAGINAL HYSTERECTOMY  1986   Fibroids    Allergies  Allergen Reactions   Lisinopril  Cough   Penicillins Itching    50 years ago   Adhesive [Tape] Itching   Atorvastatin Itching   Dilaudid  [Hydromorphone  Hcl] Itching   Hydromorphone  Itching    Outpatient Encounter Medications as of 10/01/2023  Medication Sig   acetaminophen  (TYLENOL ) 325 MG tablet Take 650 mg by mouth every 4 (four) hours as  needed for mild pain (pain score 1-3).   ALPRAZolam  (XANAX ) 0.5 MG tablet Take 0.5 mg by mouth 3 (three) times daily.   aspirin  81 MG chewable tablet Chew 81 mg by mouth daily.   cycloSPORINE  (RESTASIS ) 0.05 % ophthalmic emulsion Place 1 drop into both eyes 2 (two) times daily.   latanoprost  (XALATAN ) 0.005 % ophthalmic solution Place 1 drop into both eyes at bedtime.   lithium  carbonate 150 MG capsule TAKE ONE CAPSULE BY MOUTH TWICE DAILY WITH MEALS   losartan  (COZAAR ) 50 MG tablet TAKE ONE TABLET BY MOUTH ONCE DAILY   ondansetron  (ZOFRAN ) 4 MG tablet Take 4 mg by mouth every 6 (six) hours as needed for nausea or vomiting.   Polyethyl Glycol-Propyl Glycol (SYSTANE) 0.4-0.3 % SOLN Apply 1 drop to eye every 4 (four) hours as needed (Both eyes).   polyethylene glycol (MIRALAX  / GLYCOLAX ) 17 g packet Take 17 g by mouth as directed.  Give 1 scoop by mouth one time a day every other day for Constipation  Give 1 scoop by mouth two times a day for Constipation for 1 Day   pravastatin  (PRAVACHOL ) 10 MG tablet Take 5 mg by mouth daily.   Timolol  Maleate, Once-Daily, 0.5 % SOLN Place 1 drop into both eyes daily.   topiramate  (TOPAMAX ) 25 MG tablet TAKE ONE TABLET BY MOUTH TWICE DAILY IN THE MORNING AND IN THE EVENING   amoxicillin (AMOXIL) 500 MG tablet Take 2,000 mg by mouth as directed. 1 hour prior to dental procedure per [facility] from [facility] (Patient not taking: Reported on 10/01/2023)   No facility-administered encounter medications on file as of 10/01/2023.    Review of Systems  Constitutional:  Negative for fatigue and fever.  Respiratory:  Negative for cough and shortness of breath.   Cardiovascular:  Negative for chest pain and leg swelling.  Gastrointestinal:  Positive for abdominal distention, abdominal pain, diarrhea and nausea. Negative for blood in stool and vomiting.  Genitourinary:  Negative for dysuria, frequency and pelvic pain.  Musculoskeletal:  Positive for gait problem.   Neurological:  Negative for dizziness and weakness.  Psychiatric/Behavioral:  Negative for dysphoric mood. The patient is not nervous/anxious.     Immunization History  Administered Date(s) Administered   Fluad Quad(high Dose 65+) 11/03/2017, 10/30/2020   INFLUENZA, HIGH DOSE SEASONAL PF 11/02/2013, 11/02/2013, 11/03/2017, 10/26/2018, 10/27/2019, 11/13/2022   Influenza Split 10/24/2008, 09/26/2009, 10/08/2011, 10/05/2012   Influenza, Quadrivalent, Recombinant, Inj, Pf 10/11/2016   Influenza,inj,Quad PF,6+ Mos 11/01/2014, 10/11/2015, 10/11/2016   Influenza-Unspecified 10/24/2008, 09/26/2009, 10/08/2011, 10/05/2012, 11/02/2013, 10/11/2016, 11/05/2021   Moderna Covid-19 Vaccine Bivalent Booster 53yrs & up 11/13/2022   Moderna SARS-COV2 Booster Vaccination 06/13/2020   Moderna Sars-Covid-2 Vaccination 01/18/2019, 02/15/2019, 11/23/2019   Pfizer Covid-19 Vaccine Bivalent Booster 76yrs & up 11/15/2021   Pneumococcal Conjugate-13 08/20/2011   Pneumococcal Polysaccharide-23 01/14/2005, 06/29/2014   Pneumococcal-Unspecified 01/14/2005   Tdap 08/20/2011, 04/18/2023   Zoster Recombinant(Shingrix) 11/09/2018, 12/01/2018, 03/01/2019   Zoster, Live 10/08/2011   Pertinent  Health Maintenance Due  Topic Date Due   Influenza Vaccine  08/15/2023   DEXA SCAN  Completed      10/17/2022  2:30 PM 01/23/2023    3:14 PM 04/18/2023    8:59 AM 04/18/2023    2:40 PM 07/31/2023    1:01 PM  Fall Risk  Falls in the past year? 0 0 0 0 1  Was there an injury with Fall? 0 0 0 0 0  Was there an injury with Fall? - Comments     hit right knee  Fall Risk Category Calculator 0 0 0 0 1  Patient at Risk for Falls Due to No Fall Risks  No Fall Risks No Fall Risks No Fall Risks  Fall risk Follow up Falls evaluation completed  Falls evaluation completed Falls evaluation completed Falls evaluation completed   Functional Status Survey:    Vitals:   10/01/23 1415  BP: 122/62  Pulse: (!) 50  Resp: 18  Temp: 97.9 F  (36.6 C)  SpO2: 96%  Weight: 115 lb 3.2 oz (52.3 kg)  Height: 5' 2 (1.575 m)   Body mass index is 21.07 kg/m. Physical Exam Vitals reviewed.  Constitutional:      General: She is not in acute distress. HENT:     Head: Normocephalic.  Eyes:     General:        Right eye: No discharge.        Left eye: No discharge.  Cardiovascular:     Rate and Rhythm: Normal rate and regular rhythm.     Pulses: Normal pulses.     Heart sounds: Normal heart sounds.  Pulmonary:     Effort: Pulmonary effort is normal.     Breath sounds: Normal breath sounds.  Abdominal:     General: There is distension.     Palpations: There is no mass.     Tenderness: There is abdominal tenderness. There is no guarding or rebound.     Hernia: No hernia is present.     Comments: Hypoactive BS X 4, moderate distension to abdomen, tenderness to lower quadrants  Musculoskeletal:        General: Normal range of motion.     Cervical back: Neck supple.  Skin:    General: Skin is warm.     Capillary Refill: Capillary refill takes less than 2 seconds.  Neurological:     General: No focal deficit present.     Mental Status: She is alert and oriented to person, place, and time.     Gait: Gait abnormal.  Psychiatric:        Mood and Affect: Mood normal.     Labs reviewed: Recent Labs    04/18/23 1524 08/06/23 1609  NA 140 135  K 4.1 4.1  CL 113* 107  CO2 23 20*  GLUCOSE 96 109*  BUN 15 21  CREATININE 0.79 0.89  CALCIUM 10.3 10.6*  MG 2.1  --    Recent Labs    04/18/23 1524 08/06/23 1609  AST 18 32  ALT 12 27  ALKPHOS  --  77  BILITOT 0.5 0.5  PROT 6.5 7.4  ALBUMIN  --  3.5   Recent Labs    04/18/23 1524 07/29/23 0711 08/06/23 1609  WBC 8.0 8.9 11.1*  NEUTROABS 5,720 7,022 8.4*  HGB 12.2 12.6 10.6*  HCT 37.0 38.8 34.0*  MCV 97.1 101.3* 102.7*  PLT 277 294 382   Lab Results  Component Value Date   TSH 1.56 07/29/2023   No results found for: HGBA1C Lab Results  Component  Value Date   CHOL 212 (H) 07/29/2023   HDL 62  07/29/2023   LDLCALC 121 (H) 07/29/2023   TRIG 173 (H) 07/29/2023   CHOLHDL 3.4 07/29/2023    Significant Diagnostic Results in last 30 days:  No results found.  Assessment/Plan 1. Lower abdominal pain (Primary) - moderate distension, hypoactive BS, low back pain, tenderness to lower quadrants - KUB normal  - suspect constipation  - encourage hydration and movement - start miralax  BID x 1 day, then QOD  2. Nausea - cont Zofran  prn - cont bland diet    Family/ staff Communication: plan discussed with patient and urse  Labs/tests ordered:  cbc/diff & cmp 09/18

## 2023-10-01 NOTE — ED Provider Notes (Signed)
 [place] EMERGENCY DEPARTMENT AT [facility] Provider Note   CSN: [id number] Arrival date & time: 10/01/23  1820     Patient presents with: Constipation   Tracey [name] is a 83 y.o. female.   HPI     83 year old female comes in with chief complaint of abdominal pain, abdominal distention, nausea, vomiting.  Patient has history of GIST tumor status post resection, SBO.  Patient states that she has been having abdominal distention, nausea and vomiting for the last 2 to 3 days.  Her last bowel movement was 3 days ago.  She has been taking constipation medications without any assistance.  She has vomited 3 times today.  Patient's son is at the bedside.  He states that few years ago, patient had small bowel obstruction with similar symptoms.  Obstruction improved with conservative management.  Prior to Admission medications   Medication Sig Start Date End Date Taking? Authorizing Provider  acetaminophen  (TYLENOL ) 325 MG tablet Take 650 mg by mouth every 4 (four) hours as needed for mild pain (pain score 1-3).    [provider]  ALPRAZolam  (XANAX ) 0.5 MG tablet Take 0.5 mg by mouth 3 (three) times daily.    [provider]  amoxicillin (AMOXIL) 500 MG tablet Take 2,000 mg by mouth as directed. 1 hour prior to dental procedure per [facility] from [facility] Patient not taking: Reported on 10/01/2023    [provider]  aspirin  81 MG chewable tablet Chew 81 mg by mouth daily.    [provider]  cycloSPORINE  (RESTASIS ) 0.05 % ophthalmic emulsion Place 1 drop into both eyes 2 (two) times daily.    [provider]  latanoprost  (XALATAN ) 0.005 % ophthalmic solution Place 1 drop into both eyes at bedtime.    [provider]  lithium  carbonate 150 MG capsule TAKE ONE CAPSULE BY MOUTH TWICE DAILY WITH MEALS 05/06/23   Mast, Man X, NP  losartan  (COZAAR ) 50 MG tablet TAKE ONE TABLET BY MOUTH ONCE DAILY 10/28/22   Sherlynn Madden, MD  ondansetron  (ZOFRAN ) 4 MG tablet Take 4 mg by mouth every 6 (six) hours as needed for nausea or vomiting.    [provider]  Polyethyl Glycol-Propyl Glycol (SYSTANE) 0.4-0.3 % SOLN Apply 1 drop to eye every 4 (four) hours as needed (Both eyes).    [provider]  polyethylene glycol (MIRALAX  / GLYCOLAX ) 17 g packet Take 17 g by mouth as directed.  Give 1 scoop by mouth one time a day every other day for Constipation  Give 1 scoop by mouth two times a day for Constipation for 1 Day    [provider]  pravastatin  (PRAVACHOL ) 10 MG tablet Take 5 mg by mouth daily.    [provider]  Timolol  Maleate, Once-Daily, 0.5 % SOLN Place 1 drop into both eyes daily.    [provider]  topiramate  (TOPAMAX ) 25 MG tablet TAKE ONE TABLET BY MOUTH TWICE DAILY IN THE MORNING AND IN THE EVENING 06/16/23   Mast, Man X, NP    Allergies: Lisinopril, Penicillins, Adhesive [tape], Atorvastatin, Dilaudid  [hydromorphone  hcl], and Hydromorphone     Review of Systems  All other systems reviewed and are negative.   Updated Vital Signs BP (!) 155/86   Pulse (!) 56   Temp 97.8 F (36.6 C) (Oral)   Resp 18   SpO2 100%   Physical Exam Vitals and nursing note reviewed.  Constitutional:      Appearance: She is well-developed.  HENT:     Head: Atraumatic.  Cardiovascular:     Rate and Rhythm: Normal rate.  Pulmonary:     Effort: Pulmonary effort is normal.  Abdominal:     General: There is distension.     Tenderness: There is abdominal tenderness.  Musculoskeletal:     Cervical back: Normal range of motion and neck supple.  Skin:    General: Skin is warm and dry.  Neurological:     Mental Status: She is alert and oriented to person, place, and time.     (all labs ordered are listed, but only abnormal results are displayed) Labs Reviewed  COMPREHENSIVE METABOLIC PANEL WITH GFR - Abnormal; Notable for the following components:      Result  Value   CO2 21 (*)    Glucose, Bld 150 (*)    BUN 36 (*)    Creatinine, Ser 1.06 (*)    Calcium 11.0 (*)    GFR, Estimated 52 (*)    Anion gap 16 (*)    All other components within normal limits  CBC WITH DIFFERENTIAL/PLATELET - Abnormal; Notable for the following components:   WBC 3.4 (*)    MCV 101.0 (*)    Lymphs Abs 0.5 (*)    All other components within normal limits    EKG: None  Radiology: CT ABDOMEN PELVIS W CONTRAST Result Date: 10/01/2023 CLINICAL DATA:  Provided history: Bowel obstruction suspected Technologist notes state constipation for 3 days with poor oral intake. Vomiting. EXAM: CT ABDOMEN AND PELVIS WITH CONTRAST TECHNIQUE: Multidetector CT imaging of the abdomen and pelvis was performed using the standard protocol following bolus administration of intravenous contrast. RADIATION DOSE REDUCTION: This exam was performed according to the departmental dose-optimization program which includes automated exposure control, adjustment of the mA and/or kV according to patient size and/or use of iterative reconstruction technique. CONTRAST:  80mL OMNIPAQUE  IOHEXOL  300 MG/ML  SOLN COMPARISON:  09/18/2013 FINDINGS: Lower chest: Chronic scarring in the right middle lobe and lingula. Lobe segmental atelectasis or scarring in the periphery of the left lower lobe. No pleural effusion. Circumferential wall thickening of the distal esophagus. Small hiatal hernia. Hepatobiliary: Scattered tiny hepatic hypodensities, too small to characterize but likely cysts. Gallbladder physiologically distended, no calcified stone. No biliary dilatation. Pancreas: Parenchymal atrophy. No ductal dilatation or inflammation. Spleen: Normal in size without focal abnormality. Adrenals/Urinary Tract: No adrenal nodule. No hydronephrosis or renal inflammation. Multiple bilateral renal cysts and low-density lesions too small to characterize. Homogeneous enhancement with symmetric excretion on delayed phase imaging. The  urinary bladder is not well-defined on the current exam, may be completely decompressed Stomach/Bowel: Wall thickening of the distal esophagus. Small to moderate hiatal hernia. Fluid distention of the stomach as well as fluid within the hiatal hernia. Dilated fluid-filled small bowel. This includes marked small bowel distention in the pelvis up to 5.3 cm. Fecalization of small bowel contents with transition point in the pelvis to the right of midline, series 2, image 61 and series 10, image 82. Mild mesenteric edema. Small to moderate amount of free fluid. No bowel pneumatosis or free air. There are enteric sutures within the small bowel in the pelvis is well as right colon. Small volume of formed colonic stool. Vascular/Lymphatic: Aortic atherosclerosis. No aneurysm. Patent portal, splenic, and mesenteric veins. Calcified mesenteric lymph nodes in the right lower quadrant. No suspicious noncalcified adenopathy. Reproductive: Status post hysterectomy. No adnexal masses. Other: Small to moderate free fluid, lower abdomen pelvis predominant. No free air. No  focal fluid collection. 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 Lumbar scoliosis. There are no acute or suspicious osseous abnormalities. IMPRESSION: 1. High-grade small bowel obstruction with transition point in the pelvis to the right of midline. Mild mesenteric edema and small to moderate free fluid. 2. Small to moderate hiatal hernia. Circumferential wall thickening of the distal esophagus, can be seen with reflux or esophagitis. 3. 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 Aortic Atherosclerosis (ICD10-I70.0). Electronically Signed   By: Andrea Gasman M.D.   On: 10/01/2023 20:57     Procedures   Medications Ordered in the ED  fentaNYL  (SUBLIMAZE ) injection 50 mcg (has no administration in time range)  lactated ringers  infusion (has no administration in time  range)  promethazine  (PHENERGAN ) 12.5 mg in sodium chloride  0.9 % 50 mL IVPB (has no administration in time range)  ondansetron  (ZOFRAN ) injection 4 mg (4 mg Intravenous Given 10/01/23 1925)  lactated ringers  bolus 1,000 mL (0 mLs Intravenous Stopped 10/01/23 2143)  iohexol  (OMNIPAQUE ) 300 MG/ML solution 80 mL (80 mLs Intravenous Contrast Given 10/01/23 2014)    Clinical Course as of 10/01/23 2202  Wed Oct 01, 2023  2201 CT ABDOMEN PELVIS W CONTRAST CT independently interpreted.  Clear evidence of SBO, NG tube has been placed. Per radiologist, no perforation. [AN]  2202 CBC with Differential(!) Patient has a left shift, however that is likely because of this acute high-grade SBO. [AN]  2202 CT ABDOMEN PELVIS W CONTRAST Case discussed with Dr. Debby, general surgery.  She has reviewed patient's imaging as well.  She is comfortable with NG tube for now and admission to hospital staff. [AN]  2202 Family made aware of the diagnosis. [AN]    Clinical Course User Index [AN] Charlyn Sora, MD                                 Medical Decision Making Amount and/or Complexity of Data Reviewed Labs: ordered. Radiology: ordered.  Risk Prescription drug management. Decision regarding hospitalization.   83 year old patient comes in with chief complaint of abdominal pain, distention, nausea and vomiting.  Pertinent past medical includes history of GIST tumor s/p resection, previous history of SBO. Collateral history provided by patient's son, was at the bedside.  Differential diagnosis considered for this patient includes: Pancreatitis, Hepatobiliary pathology including cholelithiasis and cholecystitis, Gastritis/peptic ulcer disease, small bowel obstruction.  I concerns for SBO.  Basic labs, CT scan ordered.   Final diagnoses:  SBO (small bowel obstruction) [facility])    ED Discharge Orders     None          Charlyn Sora, MD 10/01/23 2202

## 2023-10-02 ENCOUNTER — Inpatient Hospital Stay (HOSPITAL_COMMUNITY)

## 2023-10-02 ENCOUNTER — Encounter (HOSPITAL_COMMUNITY): Payer: Self-pay | Admitting: Internal Medicine

## 2023-10-02 DIAGNOSIS — K56609 Unspecified intestinal obstruction, unspecified as to partial versus complete obstruction: Secondary | ICD-10-CM | POA: Diagnosis not present

## 2023-10-02 DIAGNOSIS — H9193 Unspecified hearing loss, bilateral: Secondary | ICD-10-CM | POA: Diagnosis not present

## 2023-10-02 LAB — CBC
HCT: 39.7 % (ref 36.0–46.0)
Hemoglobin: 12.7 g/dL (ref 12.0–15.0)
MCH: 31.9 pg (ref 26.0–34.0)
MCHC: 32 g/dL (ref 30.0–36.0)
MCV: 99.7 fL (ref 80.0–100.0)
Platelets: 402 K/uL — ABNORMAL HIGH (ref 150–400)
RBC: 3.98 MIL/uL (ref 3.87–5.11)
RDW: 12.9 % (ref 11.5–15.5)
WBC: 2.6 K/uL — ABNORMAL LOW (ref 4.0–10.5)
nRBC: 0 % (ref 0.0–0.2)

## 2023-10-02 LAB — BASIC METABOLIC PANEL WITH GFR
Anion gap: 15 (ref 5–15)
BUN: 39 mg/dL — ABNORMAL HIGH (ref 8–23)
CO2: 23 mmol/L (ref 22–32)
Calcium: 10.6 mg/dL — ABNORMAL HIGH (ref 8.9–10.3)
Chloride: 99 mmol/L (ref 98–111)
Creatinine, Ser: 0.95 mg/dL (ref 0.44–1.00)
GFR, Estimated: 60 mL/min — ABNORMAL LOW (ref >60)
Glucose, Bld: 153 mg/dL — ABNORMAL HIGH (ref 70–99)
Potassium: 4.1 mmol/L (ref 3.5–5.1)
Sodium: 137 mmol/L (ref 135–145)

## 2023-10-02 LAB — TSH: TSH: 0.509 u[IU]/mL (ref 0.350–4.500)

## 2023-10-02 LAB — MAGNESIUM: Magnesium: 2.1 mg/dL (ref 1.7–2.4)

## 2023-10-02 MED ORDER — POLYVINYL ALCOHOL 1.4 % OP SOLN
1.0000 [drp] | OPHTHALMIC | Status: DC | PRN
  Administered 2023-10-06 – 2023-10-20 (×9): 1 [drp] via OPHTHALMIC
  Filled 2023-10-02: qty 15

## 2023-10-02 MED ORDER — TOPIRAMATE 25 MG PO TABS
25.0000 mg | ORAL_TABLET | Freq: Two times a day (BID) | ORAL | Status: DC
Start: 2023-10-02 — End: 2023-10-07
  Administered 2023-10-02 – 2023-10-06 (×9): 25 mg via ORAL
  Filled 2023-10-02 (×9): qty 1

## 2023-10-02 MED ORDER — ALPRAZOLAM 0.5 MG PO TABS
0.5000 mg | ORAL_TABLET | Freq: Three times a day (TID) | ORAL | Status: DC
Start: 2023-10-02 — End: 2023-10-02
  Administered 2023-10-02: 0.5 mg
  Filled 2023-10-02: qty 1

## 2023-10-02 MED ORDER — TRAZODONE HCL 50 MG PO TABS
25.0000 mg | ORAL_TABLET | Freq: Once | ORAL | Status: AC
  Administered 2023-10-03: 25 mg via ORAL
  Filled 2023-10-02: qty 1

## 2023-10-02 MED ORDER — LORAZEPAM 2 MG/ML IJ SOLN
0.5000 mg | Freq: Four times a day (QID) | INTRAMUSCULAR | Status: DC | PRN
  Administered 2023-10-02 – 2023-10-05 (×8): 0.5 mg via INTRAVENOUS
  Filled 2023-10-02 (×8): qty 1

## 2023-10-02 MED ORDER — DIATRIZOATE MEGLUMINE & SODIUM 66-10 % PO SOLN
90.0000 mL | Freq: Once | ORAL | Status: AC
  Administered 2023-10-02: 90 mL via NASOGASTRIC
  Filled 2023-10-02: qty 90

## 2023-10-02 MED ORDER — CYCLOSPORINE 0.05 % OP EMUL
1.0000 [drp] | Freq: Two times a day (BID) | OPHTHALMIC | Status: DC
Start: 2023-10-02 — End: 2023-10-28
  Administered 2023-10-02 – 2023-10-27 (×47): 1 [drp] via OPHTHALMIC
  Filled 2023-10-02 (×52): qty 30

## 2023-10-02 MED ORDER — LITHIUM CARBONATE 150 MG PO CAPS
150.0000 mg | ORAL_CAPSULE | Freq: Every day | ORAL | Status: DC
  Administered 2023-10-02 – 2023-10-06 (×5): 150 mg
  Filled 2023-10-02 (×7): qty 1

## 2023-10-02 MED ORDER — LACTATED RINGERS IV SOLN: INTRAVENOUS | Status: DC

## 2023-10-02 MED ORDER — TIMOLOL MALEATE 0.5 % OP SOLN
1.0000 [drp] | Freq: Every day | OPHTHALMIC | Status: DC
  Administered 2023-10-03 – 2023-10-27 (×24): 1 [drp] via OPHTHALMIC
  Filled 2023-10-02: qty 5

## 2023-10-02 NOTE — Progress Notes (Signed)
 [facility] Progress Note     Subjective: CC:  Ongoing discomfort across her lower abdomen. States pain has been present for 4 days and gradually worsening. Backed herself off to a a liquid diet at home/ice chips without relief of sxs. [facility]. Saus the staff there was giving her imodium because she was previously having diarrhea and vomiting.   Objective: Vital signs in last 24 hours: Temp:  [97.8 F (36.6 C)-98 F (36.7 C)] 98 F (36.7 C) (09/18 0720) Pulse Rate:  [50-84] 84 (09/18 0720) Resp:  [16-18] 16 (09/18 0720) BP: (108-160)/(59-91) 110/65 (09/18 0720) SpO2:  [92 %-100 %] 94 % (09/18 0720) Weight:  [52.3 kg] 52.3 kg (09/17 1415)    Intake/Output from previous day: 09/17 0701 - 09/18 0700 In: -  Out: 1200 [Emesis/NG output:1200] Intake/Output this shift: No intake/output data recorded.  PE: Gen:  Alert, NAD, pleasant Card:  Regular rate and rhythm Pulm:  Normal effort ORA Abd: Soft, moderately distended and firm but not tense or rigid, minimally tender across the lower abdomen without rebound tenderness or guarding. No hernias or masses  NGT in R nare, not hooked up properly; I fixed it and got back 200 mL brown effluent in less than 5 minutes. Skin: warm and dry, no rashes  Psych: A&Ox3   Lab Results:  Recent Labs    10/01/23 1922 10/02/23 0546  WBC 3.4* 2.6*  HGB 13.2 12.7  HCT 41.4 39.7  PLT 336 402*   BMET Recent Labs    10/01/23 1922 10/02/23 0546  NA 136 137  K 4.4 4.1  CL 99 99  CO2 21* 23  GLUCOSE 150* 153*  BUN 36* 39*  CREATININE 1.06* 0.95  CALCIUM 11.0* 10.6*   PT/INR No results for input(s): LABPROT, INR in the last 72 hours. CMP     Component Value Date/Time   NA 137 10/02/2023 0546   NA 139 01/26/2020 0000   NA 141 04/24/2012 0929   K 4.1 10/02/2023 0546   K 4.4 04/24/2012 0929   CL 99 10/02/2023 0546   CL 109 (H) 04/24/2012 0929   CO2 23 10/02/2023 0546   CO2 23 04/24/2012 0929    GLUCOSE 153 (H) 10/02/2023 0546   GLUCOSE 85 04/24/2012 0929   BUN 39 (H) 10/02/2023 0546   BUN 19 01/26/2020 0000   BUN 20.6 04/24/2012 0929   CREATININE 0.95 10/02/2023 0546   CREATININE 0.79 04/18/2023 1524   CREATININE 0.9 04/24/2012 0929   CALCIUM 10.6 (H) 10/02/2023 0546   CALCIUM 10.5 (H) 01/12/2020 2123   CALCIUM 10.0 04/24/2012 0929   PROT 7.2 10/01/2023 1922   PROT 6.7 04/24/2012 0929   ALBUMIN 4.4 10/01/2023 1922   ALBUMIN 3.5 04/24/2012 0929   AST 22 10/01/2023 1922   AST 20 04/24/2012 0929   ALT 10 10/01/2023 1922   ALT 12 04/24/2012 0929   ALKPHOS 82 10/01/2023 1922   ALKPHOS 75 04/24/2012 0929   BILITOT 1.1 10/01/2023 1922   BILITOT 0.36 04/24/2012 0929   GFRNONAA 60 (L) 10/02/2023 0546   GFRNONAA 58 (L) 05/11/2020 0700   GFRAA 68 05/11/2020 0700   Lipase     Component Value Date/Time   LIPASE 22 09/18/2013 1810       Studies/Results: DG Abd Portable 1 View Result Date: 10/01/2023 CLINICAL DATA:  Check gastric catheter placement EXAM: PORTABLE ABDOMEN - 1 VIEW COMPARISON:  None Available. FINDINGS: Gastric catheter is noted within the distal stomach. Dilated loops  of small bowel. IMPRESSION: Gastric catheter in the stomach. Electronically Signed   By: Oneil Devonshire M.D.   On: 10/01/2023 22:20   CT ABDOMEN PELVIS W CONTRAST Result Date: 10/01/2023 CLINICAL DATA:  Provided history: Bowel obstruction suspected Technologist notes state constipation for 3 days with poor oral intake. Vomiting. EXAM: CT ABDOMEN AND PELVIS WITH CONTRAST TECHNIQUE: Multidetector CT imaging of the abdomen and pelvis was performed using the standard protocol following bolus administration of intravenous contrast. RADIATION DOSE REDUCTION: This exam was performed according to the departmental dose-optimization program which includes automated exposure control, adjustment of the mA and/or kV according to patient size and/or use of iterative reconstruction technique. CONTRAST:  80mL OMNIPAQUE   IOHEXOL  300 MG/ML  SOLN COMPARISON:  09/18/2013 FINDINGS: Lower chest: Chronic scarring in the right middle lobe and lingula. Lobe segmental atelectasis or scarring in the periphery of the left lower lobe. No pleural effusion. Circumferential wall thickening of the distal esophagus. Small hiatal hernia. Hepatobiliary: Scattered tiny hepatic hypodensities, too small to characterize but likely cysts. Gallbladder physiologically distended, no calcified stone. No biliary dilatation. Pancreas: Parenchymal atrophy. No ductal dilatation or inflammation. Spleen: Normal in size without focal abnormality. Adrenals/Urinary Tract: No adrenal nodule. No hydronephrosis or renal inflammation. Multiple bilateral renal cysts and low-density lesions too small to characterize. Homogeneous enhancement with symmetric excretion on delayed phase imaging. The urinary bladder is not well-defined on the current exam, may be completely decompressed Stomach/Bowel: Wall thickening of the distal esophagus. Small to moderate hiatal hernia. Fluid distention of the stomach as well as fluid within the hiatal hernia. Dilated fluid-filled small bowel. This includes marked small bowel distention in the pelvis up to 5.3 cm. Fecalization of small bowel contents with transition point in the pelvis to the right of midline, series 2, image 61 and series 10, image 82. Mild mesenteric edema. Small to moderate amount of free fluid. No bowel pneumatosis or free air. There are enteric sutures within the small bowel in the pelvis is well as right colon. Small volume of formed colonic stool. Vascular/Lymphatic: Aortic atherosclerosis. No aneurysm. Patent portal, splenic, and mesenteric veins. Calcified mesenteric lymph nodes in the right lower quadrant. No suspicious noncalcified adenopathy. Reproductive: Status post hysterectomy. No adnexal masses. Other: Small to moderate free fluid, lower abdomen pelvis predominant. No free air. No focal fluid collection.  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 Lumbar scoliosis. There are no acute or suspicious osseous abnormalities. IMPRESSION: 1. High-grade small bowel obstruction with transition point in the pelvis to the right of midline. Mild mesenteric edema and small to moderate free fluid. 2. Small to moderate hiatal hernia. Circumferential wall thickening of the distal esophagus, can be seen with reflux or esophagitis. 3. 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 Aortic Atherosclerosis (ICD10-I70.0). Electronically Signed   By: Andrea Gasman M.D.   On: 10/01/2023 20:57    Anti-infectives: Anti-infectives (From admission, onward)    None        Assessment/Plan High grade SBO - history of hysterectomy, extensive pelvic surgery for small bowel GIST - NGT to LIWS - SBO protocol today with gastrografin   - failure to improve may warrant diagnostic laparoscopy vs laparotomy this admission, no role for emergent surgery this morning.  Per [facility] --  Dementia  GERD [place]  HTN  Anxiety    LOS: 1 day   I reviewed nursing notes, ED provider notes, hospitalist notes, last 24 h vitals and pain scores, last 48 h intake  and output, last 24 h labs and trends, and last 24 h imaging results.  This care required moderate level of medical decision making.   Almarie Pringle, PA-C [facility] Please see Amion for pager number during day hours 7:00am-4:30pm

## 2023-10-02 NOTE — ED Notes (Signed)
 Assisted pt with bedpan

## 2023-10-02 NOTE — H&P (Incomplete)
 History and Physical    Patient: Tracey [name] FMW:[id number] DOB: 07/05/1940 DOA: 10/01/2023 DOS: the patient was seen and examined on 10/02/2023 PCP: Mast, Man X, NP  Patient coming from: ALF/ILF  Chief Complaint:  Chief Complaint  Patient presents with  . Constipation   HPI: Tracey [name] is a 83 y.o. female with medical history significant of ***       Review of Systems: unable to review all systems due to the inability of the patient to answer questions. Past Medical History:  Diagnosis Date  . Anemia   . Anxiety   . Arthritis   . Cardiac conduction disorder 03/30/2012   Overview:  STORY: ETT 03/09/2012 Echo 03/07/2012 normal Dr Ladona, bradycardia felt due to glaucoma eye drops  . Cervical dystonia   . Diverticulosis of colon   . DOE (dyspnea on exertion) 03/25/2018  . Gastroesophageal cancer (HCC)   . GERD (gastroesophageal reflux disease)   . GIST (gastrointestinal stroma tumor), malignant, colon (HCC)   . Glaucoma   . Heart murmur   . History of colon polyps 10/24/2008  . Hypertension   . Major neurocognitive disorder due to Parkinson's disease, possible    Tremors possible parkinsons  . Manic disorder, single episode, in full remission (HCC) 12/01/2009  . Sixth nerve palsy    Past Surgical History:  Procedure Laterality Date  . BILATERAL SALPINGOOPHORECTOMY  09/22/2007  . CATARACT EXTRACTION Bilateral   . ESOPHAGOGASTRODUODENOSCOPY (EGD) WITH PROPOFOL  N/A 03/25/2022   Procedure: ESOPHAGOGASTRODUODENOSCOPY (EGD) WITH PROPOFOL ;  Surgeon: Legrand Victory LITTIE DOUGLAS, MD;  Location: [facility];  Service: Gastroenterology;  Laterality: N/A;  . Gastrointestinal Stroma Tumor,  Other  1960   GIST Surgery  . ILEOCECETOMY  09/22/2007  . OVARIAN CYST REMOVAL Right 1967  . SMALL INTESTINE SURGERY  09/22/2007  . TOTAL KNEE ARTHROPLASTY Right 10/05/2019   Procedure: RIGHT TOTAL KNEE ARTHROPLASTY;  Surgeon: Addie Cordella Hamilton, MD;  Location: [facility];  Service: Orthopedics;   Laterality: Right;  . TOTAL VAGINAL HYSTERECTOMY  1986   Fibroids   Social History:  reports that she has never smoked. She has never used smokeless tobacco. She reports that she does not drink alcohol  and does not use drugs.  Allergies  Allergen Reactions  . Lisinopril Cough  . Penicillins Itching    50 years ago  . Adhesive [Tape] Itching  . Atorvastatin Itching  . Dilaudid  [Hydromorphone  Hcl] Itching  . Hydromorphone  Itching    Family History  Problem Relation Age of Onset  . Congestive Heart Failure Mother   . Dementia Mother   . Heart disease Father   . Diabetes Father   . Lung cancer Son   . Liver disease Neg Hx   . Esophageal cancer Neg Hx   . Colon cancer Neg Hx     Prior to Admission medications   Medication Sig Start Date End Date Taking? Authorizing Provider  cycloSPORINE  (RESTASIS ) 0.05 % ophthalmic emulsion Place 1 drop into both eyes 2 (two) times daily.   Yes [provider]  latanoprost  (XALATAN ) 0.005 % ophthalmic solution Place 1 drop into both eyes at bedtime.   Yes [provider]  losartan  (COZAAR ) 50 MG tablet TAKE ONE TABLET BY MOUTH ONCE DAILY 10/28/22  Yes Sherlynn Madden, MD  acetaminophen  (TYLENOL ) 325 MG tablet Take 650 mg by mouth every 4 (four) hours as needed for mild pain (pain score 1-3).    [provider]  ALPRAZolam  (XANAX ) 0.5 MG tablet Take 0.5 mg by  mouth 3 (three) times daily.    [provider]  amoxicillin (AMOXIL) 500 MG tablet Take 2,000 mg by mouth as directed. 1 hour prior to dental procedure per [facility] from [facility] Patient not taking: Reported on 10/01/2023    [provider]  aspirin  81 MG chewable tablet Chew 81 mg by mouth daily.    [provider]  lithium  carbonate 150 MG capsule TAKE ONE CAPSULE BY MOUTH TWICE DAILY WITH MEALS 05/06/23   Mast, Man X, NP  ondansetron  (ZOFRAN ) 4 MG tablet Take 4 mg by mouth every 6 (six) hours as needed for nausea or  vomiting.    [provider]  Polyethyl Glycol-Propyl Glycol (SYSTANE) 0.4-0.3 % SOLN Apply 1 drop to eye every 4 (four) hours as needed (Both eyes).    [provider]  polyethylene glycol (MIRALAX  / GLYCOLAX ) 17 g packet Take 17 g by mouth as directed.  Give 1 scoop by mouth one time a day every other day for Constipation  Give 1 scoop by mouth two times a day for Constipation for 1 Day    [provider]  pravastatin  (PRAVACHOL ) 10 MG tablet Take 5 mg by mouth daily.    [provider]  Timolol  Maleate, Once-Daily, 0.5 % SOLN Place 1 drop into both eyes daily.    [provider]  topiramate  (TOPAMAX ) 25 MG tablet TAKE ONE TABLET BY MOUTH TWICE DAILY IN THE MORNING AND IN THE EVENING 06/16/23   Mast, Man X, NP    Physical Exam: Vitals:   10/01/23 1837 10/01/23 1945 10/01/23 2200 10/01/23 2215  BP: (!) 143/64 (!) 155/86 (!) 155/75 (!) 160/75  Pulse: (!) 56  (!) 58 63  Resp: 18   18  Temp: 97.8 F (36.6 C)     TempSrc: Oral     SpO2: 100%  97% 96%   Physical Exam:  General: No acute distress, well developed, well nourished HEENT: Normocephalic, atraumatic, PERRL Cardiovascular: Normal rate and rhythm. Distal pulses intact. Pulmonary: Normal pulmonary effort, normal breath sounds Gastrointestinal: Nondistended abdomen, soft, non-tender, normoactive bowel sounds, no organomegaly Musculoskeletal:Normal ROM, no lower ext edema Lymphadenopathy: No cervical LAD. Skin: Skin is warm and dry. Neuro: No focal deficits noted, AAOx3. PSYCH: Attentive and cooperative  Data Reviewed: {Tip this will not be part of the note when signed- Document your independent interpretation of telemetry tracing, EKG, lab, Radiology test or any other diagnostic tests. Add any new diagnostic test ordered today. (Optional):[id number]} Results for orders placed or performed during the hospital encounter of 10/01/23 (from the past 24 hours)  Comprehensive metabolic panel      Status: Abnormal   Collection Time: 10/01/23  7:22 PM  Result Value Ref Range   Sodium 136 135 - 145 mmol/L   Potassium 4.4 3.5 - 5.1 mmol/L   Chloride 99 98 - 111 mmol/L   CO2 21 (L) 22 - 32 mmol/L   Glucose, Bld 150 (H) 70 - 99 mg/dL   BUN 36 (H) 8 - 23 mg/dL   Creatinine, Ser 8.93 (H) 0.44 - 1.00 mg/dL   Calcium 88.9 (H) 8.9 - 10.3 mg/dL   Total Protein 7.2 6.5 - 8.1 g/dL   Albumin 4.4 3.5 - 5.0 g/dL   AST 22 15 - 41 U/L   ALT 10 0 - 44 U/L   Alkaline Phosphatase 82 38 - 126 U/L   Total Bilirubin 1.1 0.0 - 1.2 mg/dL   GFR, Estimated 52 (L) >60 mL/min  Anion gap 16 (H) 5 - 15  CBC with Differential     Status: Abnormal   Collection Time: 10/01/23  7:22 PM  Result Value Ref Range   WBC 3.4 (L) 4.0 - 10.5 K/uL   RBC 4.10 3.87 - 5.11 MIL/uL   Hemoglobin 13.2 12.0 - 15.0 g/dL   HCT 58.5 63.9 - 53.9 %   MCV 101.0 (H) 80.0 - 100.0 fL   MCH 32.2 26.0 - 34.0 pg   MCHC 31.9 30.0 - 36.0 g/dL   RDW 87.0 88.4 - 84.4 %   Platelets 336 150 - 400 K/uL   nRBC 0.0 0.0 - 0.2 %   Neutrophils Relative % 69 %   Neutro Abs 2.4 1.7 - 7.7 K/uL   Lymphocytes Relative 16 %   Lymphs Abs 0.5 (L) 0.7 - 4.0 K/uL   Monocytes Relative 15 %   Monocytes Absolute 0.5 0.1 - 1.0 K/uL   Eosinophils Relative 0 %   Eosinophils Absolute 0.0 0.0 - 0.5 K/uL   Basophils Relative 0 %   Basophils Absolute 0.0 0.0 - 0.1 K/uL   WBC Morphology See Note    RBC Morphology MORPHOLOGY UNREMARKABLE    Smear Review Normal platelet morphology    Immature Granulocytes 0 %   Abs Immature Granulocytes 0.00 0.00 - 0.07 K/uL     Assessment and Plan: No notes have been filed under this hospital service. Service: Hospitalist     Advance Care Planning:   Code Status: Full Code the patient named her son Omar is her surrogate surrogate Management consultant.  CODE STATUS discussion deferred as the patient seems a little confused at this time.  Consults: General Surgery  Family Communication: None  Severity of  Illness: {Observation/Inpatient:21159}  Author: ARTHEA CHILD, MD 10/02/2023 12:00 AM  For on call review [email or web address].

## 2023-10-02 NOTE — H&P (Signed)
 History and Physical    Patient: Tracey [name] FMW:[id number] DOB: 1940/12/04 DOA: 10/01/2023 DOS: the patient was seen and examined on 10/02/2023 PCP: Mast, Man X, NP  Patient coming from: ALF/ILF  Chief Complaint:  Chief Complaint  Patient presents with   Constipation   HPI: Tracey [name] is a 83 y.o. female with medical history significant for cognitive impairment, bipolar disorder, possible Parkinsons disease and h/o rection of GIST tumor presents with complaints of abdominal pain nausea vomiting and a lot of abdominal distention.  The symptoms have been going on for 2 to 3 days.  They were consistent with constipation but the patient took multiple laxatives which did not improve the situation. In the emergency department the patient had a CT scan of her abd/ pelvis the patient is not able to provide much detail.  Which revealed high-grade obstruction with transition point. General surgery was called by the ED provider who recommended that the hospitalist admit her as she is on multiple medications.    [place]  Review of Systems: unable to review all systems due to the inability of the patient to answer questions. 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, possible    Tremors possible parkinsons   Manic disorder, single episode, in full remission (HCC) 12/01/2009   Sixth nerve palsy    Past Surgical History:  Procedure Laterality  Date   BILATERAL SALPINGOOPHORECTOMY  09/22/2007   CATARACT EXTRACTION Bilateral    ESOPHAGOGASTRODUODENOSCOPY (EGD) WITH PROPOFOL  N/A 03/25/2022   Procedure: ESOPHAGOGASTRODUODENOSCOPY (EGD) WITH PROPOFOL ;  Surgeon: Legrand Victory LITTIE DOUGLAS, MD;  Location: [facility];  Service: Gastroenterology;  Laterality: N/A;   Gastrointestinal Stroma Tumor,  Other  1960   GIST Surgery   ILEOCECETOMY  09/22/2007   OVARIAN CYST REMOVAL Right 1967   SMALL INTESTINE SURGERY  09/22/2007   TOTAL KNEE ARTHROPLASTY Right 10/05/2019   Procedure: RIGHT TOTAL KNEE ARTHROPLASTY;  Surgeon: Addie Cordella Hamilton, MD;  [facility] OR;  Service: Orthopedics;  Laterality: Right;   TOTAL VAGINAL HYSTERECTOMY  1986   Fibroids   Social History:  reports that she has never smoked. She has never used smokeless tobacco. She reports that she does not drink alcohol  and does not use drugs.  Allergies  Allergen Reactions   Lisinopril Cough   Penicillins Itching    50 years ago   Adhesive [Tape] Itching   Atorvastatin Itching   Dilaudid  [Hydromorphone  Hcl] Itching   Hydromorphone  Itching    Family History  Problem Relation Age of Onset   Congestive Heart Failure Mother    Dementia Mother    Heart disease Father    Diabetes Father    Lung cancer Son    Liver disease Neg Hx    Esophageal cancer Neg Hx    Colon cancer Neg Hx     Prior to Admission medications   Medication Sig Start Date  End Date Taking? Authorizing Provider  cycloSPORINE  (RESTASIS ) 0.05 % ophthalmic emulsion Place 1 drop into both eyes 2 (two) times daily.   Yes [provider]  latanoprost  (XALATAN ) 0.005 % ophthalmic solution Place 1 drop into both eyes at bedtime.   Yes [provider]  losartan  (COZAAR ) 50 MG tablet TAKE ONE TABLET BY MOUTH ONCE DAILY 10/28/22  Yes Sherlynn Madden, MD  acetaminophen  (TYLENOL ) 325 MG tablet Take 650 mg by mouth every 4 (four) hours as needed for mild pain (pain score 1-3).    [provider]  ALPRAZolam  (XANAX ) 0.5 MG tablet Take 0.5 mg by mouth 3 (three) times daily.    [provider]  amoxicillin (AMOXIL) 500 MG tablet Take 2,000 mg by mouth as directed. 1 hour prior to dental procedure per [facility] Patient not taking: Reported on 10/01/2023    [provider]  aspirin  81 MG chewable tablet Chew 81 mg by mouth daily.    [provider]  lithium  carbonate 150 MG capsule TAKE ONE CAPSULE BY MOUTH TWICE DAILY WITH MEALS 05/06/23   Mast, Man X, NP  ondansetron  (ZOFRAN ) 4 MG tablet Take 4 mg by mouth every 6 (six) hours as needed for nausea or vomiting.    [provider]  Polyethyl Glycol-Propyl Glycol (SYSTANE) 0.4-0.3 % SOLN Apply 1 drop to eye every 4 (four) hours as needed (Both eyes).    [provider]  polyethylene glycol (MIRALAX  / GLYCOLAX ) 17 g packet Take 17 g by mouth as directed.  Give 1 scoop by mouth one time a day every other day for Constipation  Give 1 scoop by mouth two times a day for Constipation for 1 Day    [provider]  pravastatin  (PRAVACHOL ) 10 MG tablet Take 5 mg by mouth daily.    [provider]  Timolol  Maleate, Once-Daily, 0.5 % SOLN Place 1 drop into both eyes daily.    [provider]  topiramate  (TOPAMAX ) 25 MG tablet TAKE ONE TABLET BY MOUTH TWICE DAILY IN THE MORNING AND IN THE EVENING 06/16/23   Mast, Man X, NP    Physical Exam: Vitals:   10/01/23 1837 10/01/23 1945 10/01/23 2200 10/01/23 2215  BP: (!) 143/64 (!) 155/86 (!) 155/75 (!) 160/75  Pulse: (!) 56  (!) 58 63  Resp: 18   18  Temp: 97.8 F (36.6 C)     TempSrc: Oral     SpO2: 100%  97% 96%   Physical Exam:  General: Frail, malnourished, elderly woman HEENT: Normocephalic, atraumatic, PERRL Cardiovascular: Normal rate and rhythm. Distal pulses intact. Pulmonary: Normal pulmonary effort, normal breath sounds Gastrointestinal: Distended abdomen, diffusely tender, hypooactive  bowel sounds Musculoskeletal:Normal ROM, no lower ext edema Lymphadenopathy: No cervical LAD. Skin: Skin is warm and dry. Neuro: Moves all extremities, cooperative PSYCH: Attentive and cooperative   Data Reviewed:  Results for orders placed or performed during the hospital encounter of 10/01/23 (from the past 24 hours)  Comprehensive metabolic panel     Status: Abnormal   Collection Time: 10/01/23  7:22 PM  Result Value Ref Range   Sodium 136 135 - 145 mmol/L   Potassium 4.4 3.5 - 5.1 mmol/L   Chloride 99 98 - 111 mmol/L   CO2 21 (L) 22 - 32 mmol/L   Glucose, Bld 150 (H) 70 - 99 mg/dL   BUN 36 (H) 8 - 23 mg/dL   Creatinine, Ser 8.93 (H) 0.44 - 1.00 mg/dL  Calcium 11.0 (H) 8.9 - 10.3 mg/dL   Total Protein 7.2 6.5 - 8.1 g/dL   Albumin 4.4 3.5 - 5.0 g/dL   AST 22 15 - 41 U/L   ALT 10 0 - 44 U/L   Alkaline Phosphatase 82 38 - 126 U/L   Total Bilirubin 1.1 0.0 - 1.2 mg/dL   GFR, Estimated 52 (L) >60 mL/min   Anion gap 16 (H) 5 - 15  CBC with Differential     Status: Abnormal   Collection Time: 10/01/23  7:22 PM  Result Value Ref Range   WBC 3.4 (L) 4.0 - 10.5 K/uL   RBC 4.10 3.87 - 5.11 MIL/uL   Hemoglobin 13.2 12.0 - 15.0 g/dL   HCT 58.5 63.9 - 53.9 %   MCV 101.0 (H) 80.0 - 100.0 fL   MCH 32.2 26.0 - 34.0 pg   MCHC 31.9 30.0 - 36.0 g/dL   RDW 87.0 88.4 - 84.4 %   Platelets 336 150 - 400 K/uL   nRBC 0.0 0.0 - 0.2 %   Neutrophils Relative % 69 %   Neutro Abs 2.4 1.7 - 7.7 K/uL   Lymphocytes Relative 16 %   Lymphs Abs 0.5 (L) 0.7 - 4.0 K/uL   Monocytes Relative 15 %   Monocytes Absolute 0.5 0.1 - 1.0 K/uL   Eosinophils Relative 0 %   Eosinophils Absolute 0.0 0.0 - 0.5 K/uL   Basophils Relative 0 %   Basophils Absolute 0.0 0.0 - 0.1 K/uL   WBC Morphology See Note    RBC Morphology MORPHOLOGY UNREMARKABLE    Smear Review Normal platelet morphology    Immature Granulocytes 0 %   Abs Immature Granulocytes 0.00 0.00 - 0.07 K/uL     Assessment and Plan: Small bowel  obstruction - NG tube to suction in place - IV Fluids  2.  Dementia - at baseline    Advance Care Planning:   Code Status: Full Code the patient named her son Omar is her surrogate surrogate Management consultant.  CODE STATUS discussion deferred as the patient seems a little confused at this time.  Consults: General Surgery  Family Communication: None  Severity of Illness: The appropriate patient status for this patient is INPATIENT. Inpatient status is judged to be reasonable and necessary in order to provide the required intensity of service to ensure the patient's safety. The patient's presenting symptoms, physical exam findings, and initial radiographic and laboratory data in the context of their chronic comorbidities is felt to place them at high risk for further clinical deterioration. Furthermore, it is not anticipated that the patient will be medically stable for discharge from the hospital within 2 midnights of admission.   * I certify that at the point of admission it is my clinical judgment that the patient will require inpatient hospital care spanning beyond 2 midnights from the point of admission due to high intensity of service, high risk for further deterioration and high frequency of surveillance required.*  Author: ARTHEA CHILD, MD 10/02/2023 12:00 AM  For on call review [email or web address]

## 2023-10-02 NOTE — Progress Notes (Signed)
 Patient seen and examined in the morning rounds.  She is still in the emergency room.  Her son Tracey [name] is at the bedside.  Patient denied any nausea vomiting or abdominal pain to me.  Patient tells me that her abdominal distention is likely related to spicy food she is eating at assisted living facility.  Her son does not agree with that.  In brief, 83 year old with mild cognitive dysfunction, bipolar disorder on lithium  and Topamax , history of multiple abdominal surgeries in the past comes with abdominal pain distention and obstipation for last 2 to 3 days.  In the emergency room hemodynamically stable.  CT scan consistent with high-grade small intestinal obstruction with transition point.  NG tube was placed and patient was admitted with surgical consultation.   High-grade small intestinal obstruction due to adhesions: N.p.o., IV fluids, NG tube and serial monitoring.  Surgery following.  Due to previous adhesions, high risk of failure of conservative management.  Bipolar disorder: Patient on lithium  and Topamax .  Currently with NG tube drainage.  Unable to find intravenous equivalent. Will batch oral medications together, give by mouth and clamp NG tube for half an hour.  Instruction given to the nursing staff. Patient is on Xanax  as needed, will change to Ativan  IV as needed. Continue other supportive care.  CODE STATUS: Discussed with patient and her son at the bedside.  She was clear on having him DNR status but currently no documentation available.  Son confirms DNR status.  They know that DNR can be reversed if patient has to go to surgery.  CODE STATUS changed to DNR with intervention.   Total time spent: 45 minutes.  Same-day admit.  No charge visit.

## 2023-10-03 ENCOUNTER — Inpatient Hospital Stay (HOSPITAL_COMMUNITY)

## 2023-10-03 DIAGNOSIS — K56609 Unspecified intestinal obstruction, unspecified as to partial versus complete obstruction: Secondary | ICD-10-CM | POA: Diagnosis not present

## 2023-10-03 LAB — CBC WITH DIFFERENTIAL/PLATELET
Abs Immature Granulocytes: 0.04 K/uL (ref 0.00–0.07)
Basophils Absolute: 0.1 K/uL (ref 0.0–0.1)
Basophils Relative: 1 %
Eosinophils Absolute: 0 K/uL (ref 0.0–0.5)
Eosinophils Relative: 0 %
HCT: 39.6 % (ref 36.0–46.0)
Hemoglobin: 12.4 g/dL (ref 12.0–15.0)
Immature Granulocytes: 1 %
Lymphocytes Relative: 12 %
Lymphs Abs: 0.8 K/uL (ref 0.7–4.0)
MCH: 31.6 pg (ref 26.0–34.0)
MCHC: 31.3 g/dL (ref 30.0–36.0)
MCV: 101 fL — ABNORMAL HIGH (ref 80.0–100.0)
Monocytes Absolute: 0.7 K/uL (ref 0.1–1.0)
Monocytes Relative: 12 %
Neutro Abs: 4.8 K/uL (ref 1.7–7.7)
Neutrophils Relative %: 74 %
Platelets: 308 K/uL (ref 150–400)
RBC: 3.92 MIL/uL (ref 3.87–5.11)
RDW: 13.4 % (ref 11.5–15.5)
WBC: 6.4 K/uL (ref 4.0–10.5)
nRBC: 0 % (ref 0.0–0.2)

## 2023-10-03 LAB — COMPREHENSIVE METABOLIC PANEL WITH GFR
ALT: 7 U/L (ref 0–44)
AST: 18 U/L (ref 15–41)
Albumin: 3.7 g/dL (ref 3.5–5.0)
Alkaline Phosphatase: 65 U/L (ref 38–126)
Anion gap: 16 — ABNORMAL HIGH (ref 5–15)
BUN: 52 mg/dL — ABNORMAL HIGH (ref 8–23)
CO2: 26 mmol/L (ref 22–32)
Calcium: 10.6 mg/dL — ABNORMAL HIGH (ref 8.9–10.3)
Chloride: 98 mmol/L (ref 98–111)
Creatinine, Ser: 1.25 mg/dL — ABNORMAL HIGH (ref 0.44–1.00)
GFR, Estimated: 43 mL/min — ABNORMAL LOW (ref >60)
Glucose, Bld: 133 mg/dL — ABNORMAL HIGH (ref 70–99)
Potassium: 3.6 mmol/L (ref 3.5–5.1)
Sodium: 140 mmol/L (ref 135–145)
Total Bilirubin: 0.6 mg/dL (ref 0.0–1.2)
Total Protein: 6.5 g/dL (ref 6.5–8.1)

## 2023-10-03 LAB — MAGNESIUM: Magnesium: 2.6 mg/dL — ABNORMAL HIGH (ref 1.7–2.4)

## 2023-10-03 LAB — PHOSPHORUS: Phosphorus: 2.8 mg/dL (ref 2.5–4.6)

## 2023-10-03 MED ORDER — ACETAMINOPHEN 10 MG/ML IV SOLN
500.0000 mg | Freq: Four times a day (QID) | INTRAVENOUS | Status: AC | PRN
  Administered 2023-10-03: 500 mg via INTRAVENOUS

## 2023-10-03 MED ORDER — POTASSIUM CHLORIDE IN NACL 20-0.9 MEQ/L-% IV SOLN
INTRAVENOUS | Status: AC
  Filled 2023-10-03 (×2): qty 1000

## 2023-10-03 MED ORDER — FENTANYL CITRATE PF 50 MCG/ML IJ SOSY
25.0000 ug | PREFILLED_SYRINGE | INTRAMUSCULAR | Status: AC | PRN
  Administered 2023-10-03 (×3): 25 ug via INTRAVENOUS
  Filled 2023-10-03 (×3): qty 1

## 2023-10-03 NOTE — TOC Initial Note (Signed)
 Transition of Care [facility]) - Initial/Assessment Note    Patient Details  Name: Tracey [name] MRN: [id number] Date of Birth: 01-Nov-1940  Transition of Care [facility]) CM/SW Contact:    Bascom Service, RN Phone Number: 10/03/2023, 9:33 AM  Clinical Narrative:Spoke to Kirk(Son) d/c plans return back to [facility] rep Katie aware(if ST SNF needed facility has bed available), has rw-indep ADL's,eyeglasses,continent B/B. PT cons-await recc.                Expected Discharge Plan: Assisted Living Barriers to Discharge: Continued Medical Work up   Patient Goals and CMS Choice Patient states their goals for this hospitalization and ongoing recovery are:: [facility]-ALF if possible. CMS Medicare.gov Compare Post Acute Care list provided to:: Patient Represenative (must comment) (Kirk(son)) Choice offered to / list presented to : Adult Children [place] ownership interest in [facility].provided to:: Adult Children    Expected Discharge Plan and Services   Discharge Planning Services: CM Consult Post Acute Care Choice: Resumption of Svcs/PTA Provider                                        Prior Living Arrangements/Services   Lives with:: Facility Resident   Do you feel safe going back to the place where you live?: Yes          Current home services: DME (rw)    Activities of Daily Living   ADL Screening (condition at time of admission) Independently performs ADLs?: No Does the patient have a NEW difficulty with bathing/dressing/toileting/self-feeding that is expected to last >3 days?: No Does the patient have a NEW difficulty with getting in/out of bed, walking, or climbing stairs that is expected to last >3 days?: No Does the patient have a NEW difficulty with communication that is expected to last >3 days?: No Is the patient deaf or have difficulty hearing?: No Does the patient have difficulty seeing, even when wearing glasses/contacts?:  No Does the patient have difficulty concentrating, remembering, or making decisions?: No  Permission Sought/Granted Permission sought to share information with : Case Manager Permission granted to share information with : Yes, Verbal Permission Granted              Emotional Assessment              Admission diagnosis:  Seizure (HCC) [R56.9] Small bowel obstruction (HCC) [K56.609] SBO (small bowel obstruction) (HCC) [K56.609] Patient Active Problem List   Diagnosis Date Noted   Urinary frequency 09/19/2022   Gait abnormality 08/01/2022   Dry mouth 07/11/2022   Corn of foot 05/09/2022   Toe pain, left 05/09/2022   Abnormal finding on GI tract imaging 03/25/2022   Dysphagia 12/19/2021   Oropharyngeal candidiasis 10/10/2021   Varicose veins of left leg with edema 12/28/2020   Dermatitis 11/30/2020   Major neurocognitive disorder (HCC) 02/10/2020   Tinnitus of both ears 02/02/2020   Cervical dystonia 01/26/2020   CKD (chronic kidney disease) stage 3, GFR 30-59 ml/min (HCC) 01/26/2020   Headache 01/12/2020   Speech abnormality 01/12/2020   Edema 07/01/2019   Right knee pain 06/17/2019   Hypercalcemia 06/17/2019   Slow transit constipation 05/31/2019   Bradycardia 05/31/2019   History of shingles 04/22/2019   Upper back pain on right side 04/01/2019   DOE (dyspnea on exertion) 03/25/2018   Cough variant asthma 03/24/2018   Diplopia 10/16/2015  SBO (small bowel obstruction) (HCC) 09/18/2013   Glaucoma 09/18/2013   GERD (gastroesophageal reflux disease) 09/18/2013   HLD (hyperlipidemia) 02/21/2013   Essential (primary) hypertension 02/21/2013   Barrett esophagus 02/19/2013   Cardiac conduction disorder 03/30/2012   Difficulty hearing 08/20/2011   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 [place] [address] Phone: [phone] Fax: [phone]     Social Drivers of Health (SDOH) Social History: SDOH Screenings   Food Insecurity: No Food Insecurity (10/02/2023)  Housing: Low Risk  (10/02/2023)  Transportation Needs: No Transportation Needs (10/02/2023)  Utilities: Not At Risk (10/02/2023)  Depression (PHQ2-9): Low Risk  (10/02/2023)  Social Connections: Moderately Integrated (10/02/2023)  Tobacco Use: Low Risk  (10/01/2023)   SDOH Interventions:     Readmission Risk Interventions     No data to display

## 2023-10-03 NOTE — Progress Notes (Signed)
 PROGRESS NOTE    Tracey [name]  FMW:[id number] DOB: 16-Sep-1940 DOA: 10/01/2023 PCP: Mast, Man X, NP  Outpatient Specialists:     Brief Narrative:  As per H&P done on admission: Tracey [name] is a 83 y.o. female with medical history significant for cognitive impairment, bipolar disorder, possible Parkinsons disease and h/o rection of GIST tumor presents with complaints of abdominal pain nausea vomiting and a lot of abdominal distention.  The symptoms have been going on for 2 to 3 days.  They were consistent with constipation but the patient took multiple laxatives which did not improve the situation. In the emergency department the patient had a CT scan of her abd/ pelvis revealed high-grade small bowel obstruction with transition point in the pelvis to the right of midline.  Mild mesenteric edema and small to moderate free fluid reported.   General surgery was called by the ED provider who recommended that the hospitalist admit her as she is on multiple medications.   10/03/2023: Patient seen.  No significant history from patient.  No new complaints.  No bowel movement of flatus.  NG tube to low intermittent wall suction.  General surgery team input is appreciated.  Patient is currently being worked closely.     Assessment & Plan:   Principal Problem:   SBO (small bowel obstruction) (HCC) Active Problems:   Difficulty hearing   Small bowel obstruction: - See above documentation. - NG tube to low intermittent wall suction. - Supportive care. - Adequate hydration.  Dementia: - No behavioral problems.  Volume depletion: - Keep patient hydrated. - IV fluids.  Hypertension: - BP is controlled.   DVT prophylaxis: Start subcutaneous Lovenox  Code Status: DNR Family Communication:  Disposition Plan: This will depend on hospital course   Consultants:  Neurosurgery  Procedures:  NG tube to low intermittent wall suction  Antimicrobials:  None   Subjective: No new  complaints  Objective: Vitals:   10/02/23 1810 10/02/23 2007 10/03/23 0019 10/03/23 0509  BP: 122/73 133/72 133/79 124/70  Pulse: 79 76 75 81  Resp: 18 16 18 16   Temp: 98 F (36.7 C) 98.6 F (37 C) 97.8 F (36.6 C) 99.1 F (37.3 C)  TempSrc: Oral Oral Oral Oral  SpO2: 95% 99% 95% 96%  Weight:      Height:        Intake/Output Summary (Last 24 hours) at 10/03/2023 1345 Last data filed at 10/03/2023 1159 Gross per 24 hour  Intake 2216.15 ml  Output 980 ml  Net 1236.15 ml   Filed Weights   10/02/23 1456  Weight: 49.9 kg    Examination:  General exam: Appears calm and comfortable  Respiratory system: Clear to auscultation.  Cardiovascular system: S1 & S2 heard. Gastrointestinal system: Abdomen is nondistended Central nervous system: Awake and alert.    Data Reviewed: I have personally reviewed following labs and imaging studies  CBC: Recent Labs  Lab 10/01/23 1922 10/02/23 0546 10/03/23 0443  WBC 3.4* 2.6* 6.4  NEUTROABS 2.4  --  4.8  HGB 13.2 12.7 12.4  HCT 41.4 39.7 39.6  MCV 101.0* 99.7 101.0*  PLT 336 402* 308   Basic Metabolic Panel: Recent Labs  Lab 10/01/23 1922 10/02/23 0546 10/03/23 0443  NA 136 137 140  K 4.4 4.1 3.6  CL 99 99 98  CO2 21* 23 26  GLUCOSE 150* 153* 133*  BUN 36* 39* 52*  CREATININE 1.06* 0.95 1.25*  CALCIUM 11.0* 10.6* 10.6*  MG  --  2.1  2.6*  PHOS  --   --  2.8   GFR: Estimated Creatinine Clearance: 27.3 mL/min (A) (by C-G formula based on SCr of 1.25 mg/dL (H)). Liver Function Tests: Recent Labs  Lab 10/01/23 1922 10/03/23 0443  AST 22 18  ALT 10 7  ALKPHOS 82 65  BILITOT 1.1 0.6  PROT 7.2 6.5  ALBUMIN 4.4 3.7   No results for input(s): LIPASE, AMYLASE in the last 168 hours. No results for input(s): AMMONIA in the last 168 hours. Coagulation Profile: No results for input(s): INR, PROTIME in the last 168 hours. Cardiac Enzymes: No results for input(s): CKTOTAL, CKMB, CKMBINDEX, TROPONINI  in the last 168 hours. BNP (last 3 results) No results for input(s): PROBNP in the last 8760 hours. HbA1C: No results for input(s): HGBA1C in the last 72 hours. CBG: No results for input(s): GLUCAP in the last 168 hours. Lipid Profile: No results for input(s): CHOL, HDL, LDLCALC, TRIG, CHOLHDL, LDLDIRECT in the last 72 hours. Thyroid  Function Tests: Recent Labs    10/02/23 0546  TSH 0.509   Anemia Panel: No results for input(s): VITAMINB12, FOLATE, FERRITIN, TIBC, IRON, RETICCTPCT in the last 72 hours. Urine analysis:    Component Value Date/Time   COLORURINE STRAW (A) 01/12/2020 2103   APPEARANCEUR CLEAR 01/12/2020 2103   LABSPEC 1.012 01/12/2020 2103   PHURINE 6.0 01/12/2020 2103   GLUCOSEU NEGATIVE 01/12/2020 2103   HGBUR NEGATIVE 01/12/2020 2103   BILIRUBINUR NEGATIVE 01/12/2020 2103   KETONESUR NEGATIVE 01/12/2020 2103   PROTEINUR NEGATIVE 01/12/2020 2103   NITRITE NEGATIVE 01/12/2020 2103   LEUKOCYTESUR NEGATIVE 01/12/2020 2103   Sepsis Labs: @LABRCNTIP (procalcitonin:4,lacticidven:4)  )No results found for this or any previous visit (from the past 240 hours).       Radiology Studies: DG Abd 1 View Result Date: 10/03/2023 CLINICAL DATA:  83 year old female with high-grade small bowel obstruction on CT Abdomen and Pelvis 2 days ago. Enteric tube in place. Oral contrast administered. EXAM: ABDOMEN - 1 VIEW COMPARISON:  10/02/2023 comments are body CT 10/01/2023. FINDINGS: AP view at 0419 hours. Stable enteric tube, side hole the level of the gastric body. Continued gas distended small bowel loops throughout the mid and lower abdomen. Smaller volume of excreted IV contrast in the urinary bladder. Proximal small bowel oral contrast was better demonstrated yesterday. The large bowel is present as seen on recent CT. But no large bowel contrast is identified. Gas pattern does not appear improved since 10/01/2023. Lung bases appear stable,  negative. No acute osseous abnormality identified. Thoracolumbar scoliosis. IMPRESSION: 1. Small bowel obstruction pattern with no improvement since the CT on 10/01/2023. Oral contrast identified in the large bowel. 2. Stable enteric tube, side hole at the mid stomach. Electronically Signed   By: VEAR Hurst M.D.   On: 10/03/2023 04:49   DG Abd Portable 1V-Small Bowel Obstruction Protocol-initial, 8 hr delay Result Date: 10/02/2023 CLINICAL DATA:  Small bowel obstruction EXAM: PORTABLE ABDOMEN - 1 VIEW COMPARISON:  10/01/2023 plain films and CT. FINDINGS: NG tube is in the stomach. Dilated small bowel loops noted in the left abdomen and pelvis compatible with small bowel obstruction. No organomegaly or free air. IMPRESSION: Small bowel obstruction pattern. NG tube in the distal stomach. Electronically Signed   By: Franky Crease M.D.   On: 10/02/2023 14:16   DG Abd Portable 1 View Result Date: 10/01/2023 CLINICAL DATA:  Check gastric catheter placement EXAM: PORTABLE ABDOMEN - 1 VIEW COMPARISON:  None Available. FINDINGS: Gastric catheter is noted within  the distal stomach. Dilated loops of small bowel. IMPRESSION: Gastric catheter in the stomach. Electronically Signed   By: Oneil Devonshire M.D.   On: 10/01/2023 22:20   CT ABDOMEN PELVIS W CONTRAST Result Date: 10/01/2023 CLINICAL DATA:  Provided history: Bowel obstruction suspected Technologist notes state constipation for 3 days with poor oral intake. Vomiting. EXAM: CT ABDOMEN AND PELVIS WITH CONTRAST TECHNIQUE: Multidetector CT imaging of the abdomen and pelvis was performed using the standard protocol following bolus administration of intravenous contrast. RADIATION DOSE REDUCTION: This exam was performed according to the departmental dose-optimization program which includes automated exposure control, adjustment of the mA and/or kV according to patient size and/or use of iterative reconstruction technique. CONTRAST:  80mL OMNIPAQUE  IOHEXOL  300 MG/ML  SOLN  COMPARISON:  09/18/2013 FINDINGS: Lower chest: Chronic scarring in the right middle lobe and lingula. Lobe segmental atelectasis or scarring in the periphery of the left lower lobe. No pleural effusion. Circumferential wall thickening of the distal esophagus. Small hiatal hernia. Hepatobiliary: Scattered tiny hepatic hypodensities, too small to characterize but likely cysts. Gallbladder physiologically distended, no calcified stone. No biliary dilatation. Pancreas: Parenchymal atrophy. No ductal dilatation or inflammation. Spleen: Normal in size without focal abnormality. Adrenals/Urinary Tract: No adrenal nodule. No hydronephrosis or renal inflammation. Multiple bilateral renal cysts and low-density lesions too small to characterize. Homogeneous enhancement with symmetric excretion on delayed phase imaging. The urinary bladder is not well-defined on the current exam, may be completely decompressed Stomach/Bowel: Wall thickening of the distal esophagus. Small to moderate hiatal hernia. Fluid distention of the stomach as well as fluid within the hiatal hernia. Dilated fluid-filled small bowel. This includes marked small bowel distention in the pelvis up to 5.3 cm. Fecalization of small bowel contents with transition point in the pelvis to the right of midline, series 2, image 61 and series 10, image 82. Mild mesenteric edema. Small to moderate amount of free fluid. No bowel pneumatosis or free air. There are enteric sutures within the small bowel in the pelvis is well as right colon. Small volume of formed colonic stool. Vascular/Lymphatic: Aortic atherosclerosis. No aneurysm. Patent portal, splenic, and mesenteric veins. Calcified mesenteric lymph nodes in the right lower quadrant. No suspicious noncalcified adenopathy. Reproductive: Status post hysterectomy. No adnexal masses. Other: Small to moderate free fluid, lower abdomen pelvis predominant. No free air. No focal fluid collection. Musculoskeletal: Chronic  avascular necrosis of the femoral heads without subchondral collapse. Lumbar scoliosis. There are no acute or suspicious osseous abnormalities. IMPRESSION: 1. High-grade small bowel obstruction with transition point in the pelvis to the right of midline. Mild mesenteric edema and small to moderate free fluid. 2. Small to moderate hiatal hernia. Circumferential wall thickening of the distal esophagus, can be seen with reflux or esophagitis. 3. 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 Aortic Atherosclerosis (ICD10-I70.0). Electronically Signed   By: Andrea Gasman M.D.   On: 10/01/2023 20:57        Scheduled Meds:  cycloSPORINE   1 drop Both Eyes BID   latanoprost   1 drop Both Eyes QHS   lithium  carbonate  150 mg Per Tube Q2000   timolol   1 drop Both Eyes Daily   topiramate   25 mg Oral BID   Continuous Infusions:  lactated ringers  100 mL/hr at 10/03/23 1141     LOS: 2 days    Time spent: 35 minutes    Leatrice Chapel, MD  [facility] Pager #: [phone] 7PM-7AM contact night coverage as above

## 2023-10-03 NOTE — Progress Notes (Signed)
 [facility] Progress Note     Subjective: CC:  Denies flatus or BM. Reports nausea.  She pulled out her NGT yesterday and it was replaced. Her son is at the bedside.   Objective: Vital signs in last 24 hours: Temp:  [97.7 F (36.5 C)-99.1 F (37.3 C)] 99.1 F (37.3 C) (09/19 0509) Pulse Rate:  [75-85] 81 (09/19 0509) Resp:  [16-18] 16 (09/19 0509) BP: (100-133)/(70-83) 124/70 (09/19 0509) SpO2:  [95 %-99 %] 96 % (09/19 0509) Weight:  [49.9 kg] 49.9 kg (09/18 1456) Last BM Date : 09/29/23  Intake/Output from previous day: 09/18 0701 - 09/19 0700 In: 1627.7 [I.V.:1497.5; NG/GT:30; IV Piggyback:100.2] Out: 630 [Urine:500; Emesis/NG output:130] Intake/Output this shift: Total I/O In: 282.1 [P.O.:30; I.V.:222.1; NG/GT:30] Out: 350 [Emesis/NG output:350]  PE: Gen:  Alert, NAD, pleasant Card:  Regular rate and rhythm Pulm:  Normal effort ORA Abd: Soft, moderately distended and firm but not tense or rigid, minimally tender across the lower abdomen without rebound tenderness or guarding. No hernias or masses  NGT in place, not properly pulling fluid, switched over cannister and flushed, placed to continuous suction with return of 150-200 cc of brown bilious fluid.  Skin: warm and dry, no rashes  Psych: A&Ox3   Lab Results:  Recent Labs    10/02/23 0546 10/03/23 0443  WBC 2.6* 6.4  HGB 12.7 12.4  HCT 39.7 39.6  PLT 402* 308   BMET Recent Labs    10/02/23 0546 10/03/23 0443  NA 137 140  K 4.1 3.6  CL 99 98  CO2 23 26  GLUCOSE 153* 133*  BUN 39* 52*  CREATININE 0.95 1.25*  CALCIUM 10.6* 10.6*   PT/INR No results for input(s): LABPROT, INR in the last 72 hours. CMP     Component Value Date/Time   NA 140 10/03/2023 0443   NA 139 01/26/2020 0000   NA 141 04/24/2012 0929   K 3.6 10/03/2023 0443   K 4.4 04/24/2012 0929   CL 98 10/03/2023 0443   CL 109 (H) 04/24/2012 0929   CO2 26 10/03/2023 0443   CO2 23 04/24/2012 0929   GLUCOSE 133 (H)  10/03/2023 0443   GLUCOSE 85 04/24/2012 0929   BUN 52 (H) 10/03/2023 0443   BUN 19 01/26/2020 0000   BUN 20.6 04/24/2012 0929   CREATININE 1.25 (H) 10/03/2023 0443   CREATININE 0.79 04/18/2023 1524   CREATININE 0.9 04/24/2012 0929   CALCIUM 10.6 (H) 10/03/2023 0443   CALCIUM 10.5 (H) 01/12/2020 2123   CALCIUM 10.0 04/24/2012 0929   PROT 6.5 10/03/2023 0443   PROT 6.7 04/24/2012 0929   ALBUMIN 3.7 10/03/2023 0443   ALBUMIN 3.5 04/24/2012 0929   AST 18 10/03/2023 0443   AST 20 04/24/2012 0929   ALT 7 10/03/2023 0443   ALT 12 04/24/2012 0929   ALKPHOS 65 10/03/2023 0443   ALKPHOS 75 04/24/2012 0929   BILITOT 0.6 10/03/2023 0443   BILITOT 0.36 04/24/2012 0929   GFRNONAA 43 (L) 10/03/2023 0443   GFRNONAA 58 (L) 05/11/2020 0700   GFRAA 68 05/11/2020 0700   Lipase     Component Value Date/Time   LIPASE 22 09/18/2013 1810       Studies/Results: DG Abd 1 View Result Date: 10/03/2023 CLINICAL DATA:  83 year old female with high-grade small bowel obstruction on CT Abdomen and Pelvis 2 days ago. Enteric tube in place. Oral contrast administered. EXAM: ABDOMEN - 1 VIEW COMPARISON:  10/02/2023 comments are body CT 10/01/2023. FINDINGS: AP view  at 0419 hours. Stable enteric tube, side hole the level of the gastric body. Continued gas distended small bowel loops throughout the mid and lower abdomen. Smaller volume of excreted IV contrast in the urinary bladder. Proximal small bowel oral contrast was better demonstrated yesterday. The large bowel is present as seen on recent CT. But no large bowel contrast is identified. Gas pattern does not appear improved since 10/01/2023. Lung bases appear stable, negative. No acute osseous abnormality identified. Thoracolumbar scoliosis. IMPRESSION: 1. Small bowel obstruction pattern with no improvement since the CT on 10/01/2023. Oral contrast identified in the large bowel. 2. Stable enteric tube, side hole at the mid stomach. Electronically Signed   By: VEAR Hurst M.D.   On: 10/03/2023 04:49   DG Abd Portable 1V-Small Bowel Obstruction Protocol-initial, 8 hr delay Result Date: 10/02/2023 CLINICAL DATA:  Small bowel obstruction EXAM: PORTABLE ABDOMEN - 1 VIEW COMPARISON:  10/01/2023 plain films and CT. FINDINGS: NG tube is in the stomach. Dilated small bowel loops noted in the left abdomen and pelvis compatible with small bowel obstruction. No organomegaly or free air. IMPRESSION: Small bowel obstruction pattern. NG tube in the distal stomach. Electronically Signed   By: Franky Crease M.D.   On: 10/02/2023 14:16   DG Abd Portable 1 View Result Date: 10/01/2023 CLINICAL DATA:  Check gastric catheter placement EXAM: PORTABLE ABDOMEN - 1 VIEW COMPARISON:  None Available. FINDINGS: Gastric catheter is noted within the distal stomach. Dilated loops of small bowel. IMPRESSION: Gastric catheter in the stomach. Electronically Signed   By: Oneil Devonshire M.D.   On: 10/01/2023 22:20   CT ABDOMEN PELVIS W CONTRAST Result Date: 10/01/2023 CLINICAL DATA:  Provided history: Bowel obstruction suspected Technologist notes state constipation for 3 days with poor oral intake. Vomiting. EXAM: CT ABDOMEN AND PELVIS WITH CONTRAST TECHNIQUE: Multidetector CT imaging of the abdomen and pelvis was performed using the standard protocol following bolus administration of intravenous contrast. RADIATION DOSE REDUCTION: This exam was performed according to the departmental dose-optimization program which includes automated exposure control, adjustment of the mA and/or kV according to patient size and/or use of iterative reconstruction technique. CONTRAST:  80mL OMNIPAQUE  IOHEXOL  300 MG/ML  SOLN COMPARISON:  09/18/2013 FINDINGS: Lower chest: Chronic scarring in the right middle lobe and lingula. Lobe segmental atelectasis or scarring in the periphery of the left lower lobe. No pleural effusion. Circumferential wall thickening of the distal esophagus. Small hiatal hernia. Hepatobiliary: Scattered  tiny hepatic hypodensities, too small to characterize but likely cysts. Gallbladder physiologically distended, no calcified stone. No biliary dilatation. Pancreas: Parenchymal atrophy. No ductal dilatation or inflammation. Spleen: Normal in size without focal abnormality. Adrenals/Urinary Tract: No adrenal nodule. No hydronephrosis or renal inflammation. Multiple bilateral renal cysts and low-density lesions too small to characterize. Homogeneous enhancement with symmetric excretion on delayed phase imaging. The urinary bladder is not well-defined on the current exam, may be completely decompressed Stomach/Bowel: Wall thickening of the distal esophagus. Small to moderate hiatal hernia. Fluid distention of the stomach as well as fluid within the hiatal hernia. Dilated fluid-filled small bowel. This includes marked small bowel distention in the pelvis up to 5.3 cm. Fecalization of small bowel contents with transition point in the pelvis to the right of midline, series 2, image 61 and series 10, image 82. Mild mesenteric edema. Small to moderate amount of free fluid. No bowel pneumatosis or free air. There are enteric sutures within the small bowel in the pelvis is well as right colon. Small volume  of formed colonic stool. Vascular/Lymphatic: Aortic atherosclerosis. No aneurysm. Patent portal, splenic, and mesenteric veins. Calcified mesenteric lymph nodes in the right lower quadrant. No suspicious noncalcified adenopathy. Reproductive: Status post hysterectomy. No adnexal masses. Other: Small to moderate free fluid, lower abdomen pelvis predominant. No free air. No focal fluid collection. 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 Lumbar scoliosis. There are no acute or suspicious osseous abnormalities. IMPRESSION: 1. High-grade small bowel obstruction with transition point in the pelvis to the right of midline. Mild mesenteric edema and small to moderate free fluid.  2. Small to moderate hiatal hernia. Circumferential wall thickening of the distal esophagus, can be seen with reflux or esophagitis. 3. 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 Aortic Atherosclerosis (ICD10-I70.0). Electronically Signed   By: Andrea Gasman M.D.   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 Continue NG To LIWS and await bowel function.  - failure to improve may warrant diagnostic laparoscopy vs laparotomy this admission, no role for emergent surgery this morning.  Per [facility] --  Dementia  GERD Termors  HTN  Anxiety    LOS: 2 days   I reviewed nursing notes, ED provider notes, hospitalist notes, last 24 h vitals and pain scores, last 48 h intake and output, last 24 h labs and trends, and last 24 h imaging results.  This care required moderate level of medical decision making.   Almarie Pringle, PA-C [facility] Please see Amion for pager number during day hours 7:00am-4:30pm

## 2023-10-03 NOTE — Progress Notes (Signed)
 At 3:30am pt pulled out NG tube. NG tube replaced. placement verified by auscultation. CXR performed,placement confirmed. At 5:15 suction restarted.

## 2023-10-04 ENCOUNTER — Inpatient Hospital Stay (HOSPITAL_COMMUNITY)

## 2023-10-04 DIAGNOSIS — I4891 Unspecified atrial fibrillation: Secondary | ICD-10-CM | POA: Diagnosis not present

## 2023-10-04 DIAGNOSIS — K56609 Unspecified intestinal obstruction, unspecified as to partial versus complete obstruction: Secondary | ICD-10-CM | POA: Diagnosis not present

## 2023-10-04 LAB — LACTIC ACID, PLASMA
Lactic Acid, Venous: 1.1 mmol/L (ref 0.5–1.9)
Lactic Acid, Venous: 0.9 mmol/L (ref 0.5–1.9)

## 2023-10-04 LAB — CBC WITH DIFFERENTIAL/PLATELET
Abs Immature Granulocytes: 0.06 K/uL (ref 0.00–0.07)
Basophils Absolute: 0.1 K/uL (ref 0.0–0.1)
Basophils Relative: 1 %
Eosinophils Absolute: 0 K/uL (ref 0.0–0.5)
Eosinophils Relative: 0 %
HCT: 42.1 % (ref 36.0–46.0)
Hemoglobin: 12.5 g/dL (ref 12.0–15.0)
Immature Granulocytes: 1 %
Lymphocytes Relative: 17 %
Lymphs Abs: 1.1 K/uL (ref 0.7–4.0)
MCH: 31.1 pg (ref 26.0–34.0)
MCHC: 29.7 g/dL — ABNORMAL LOW (ref 30.0–36.0)
MCV: 104.7 fL — ABNORMAL HIGH (ref 80.0–100.0)
Monocytes Absolute: 0.9 K/uL (ref 0.1–1.0)
Monocytes Relative: 14 %
Neutro Abs: 4 K/uL (ref 1.7–7.7)
Neutrophils Relative %: 67 %
Platelets: 310 K/uL (ref 150–400)
RBC: 4.02 MIL/uL (ref 3.87–5.11)
RDW: 13.2 % (ref 11.5–15.5)
WBC: 6.1 K/uL (ref 4.0–10.5)
nRBC: 0 % (ref 0.0–0.2)

## 2023-10-04 LAB — ECHOCARDIOGRAM COMPLETE
Calc EF: 60 %
Height: 62 in
Single Plane A2C EF: 44.8 %
Single Plane A4C EF: 71.9 %
Weight: 1760.15 [oz_av]

## 2023-10-04 LAB — PRO BRAIN NATRIURETIC PEPTIDE: Pro Brain Natriuretic Peptide: 529 pg/mL — ABNORMAL HIGH (ref <300.0)

## 2023-10-04 LAB — RENAL FUNCTION PANEL
Albumin: 3.6 g/dL (ref 3.5–5.0)
Anion gap: 15 (ref 5–15)
BUN: 39 mg/dL — ABNORMAL HIGH (ref 8–23)
CO2: 26 mmol/L (ref 22–32)
Calcium: 10.5 mg/dL — ABNORMAL HIGH (ref 8.9–10.3)
Chloride: 104 mmol/L (ref 98–111)
Creatinine, Ser: 1.06 mg/dL — ABNORMAL HIGH (ref 0.44–1.00)
GFR, Estimated: 52 mL/min — ABNORMAL LOW (ref >60)
Glucose, Bld: 95 mg/dL (ref 70–99)
Phosphorus: 1.8 mg/dL — ABNORMAL LOW (ref 2.5–4.6)
Potassium: 3.6 mmol/L (ref 3.5–5.1)
Sodium: 145 mmol/L (ref 135–145)

## 2023-10-04 LAB — MRSA NEXT GEN BY PCR, NASAL: MRSA by PCR Next Gen: NOT DETECTED

## 2023-10-04 LAB — PROCALCITONIN: Procalcitonin: 0.33 ng/mL

## 2023-10-04 LAB — HEPARIN LEVEL (UNFRACTIONATED): Heparin Unfractionated: 0.4 [IU]/mL (ref 0.30–0.70)

## 2023-10-04 LAB — TROPONIN T, HIGH SENSITIVITY
Troponin T High Sensitivity: 29 ng/L — ABNORMAL HIGH (ref 0–19)
Troponin T High Sensitivity: 30 ng/L — ABNORMAL HIGH (ref 0–19)

## 2023-10-04 LAB — MAGNESIUM: Magnesium: 2.5 mg/dL — ABNORMAL HIGH (ref 1.7–2.4)

## 2023-10-04 MED ORDER — AMIODARONE LOAD VIA INFUSION
150.0000 mg | Freq: Once | INTRAVENOUS | Status: AC
Start: 2023-10-04 — End: 2023-10-04
  Administered 2023-10-04: 150 mg via INTRAVENOUS
  Filled 2023-10-04: qty 83.34

## 2023-10-04 MED ORDER — ORAL CARE MOUTH RINSE: 15.0000 mL | OROMUCOSAL | Status: DC | PRN

## 2023-10-04 MED ORDER — POTASSIUM CHLORIDE IN NACL 20-0.9 MEQ/L-% IV SOLN
INTRAVENOUS | Status: AC
  Filled 2023-10-04: qty 1000

## 2023-10-04 MED ORDER — CHLORHEXIDINE GLUCONATE CLOTH 2 % EX PADS
6.0000 | MEDICATED_PAD | Freq: Every day | CUTANEOUS | Status: DC
  Administered 2023-10-04 – 2023-10-13 (×11): 6 via TOPICAL

## 2023-10-04 MED ORDER — SODIUM CHLORIDE 0.9 % IV BOLUS
250.0000 mL | Freq: Once | INTRAVENOUS | Status: AC
Start: 2023-10-04 — End: 2023-10-04
  Administered 2023-10-04: 250 mL via INTRAVENOUS

## 2023-10-04 MED ORDER — AMIODARONE HCL IN DEXTROSE 360-4.14 MG/200ML-% IV SOLN
30.0000 mg/h | INTRAVENOUS | Status: DC
Start: 2023-10-04 — End: 2023-10-07
  Administered 2023-10-05 – 2023-10-07 (×4): 30 mg/h via INTRAVENOUS
  Filled 2023-10-04 (×5): qty 200

## 2023-10-04 MED ORDER — POTASSIUM PHOSPHATES 15 MMOLE/5ML IV SOLN
15.0000 mmol | Freq: Once | INTRAVENOUS | Status: AC
  Administered 2023-10-04: 15 mmol via INTRAVENOUS
  Filled 2023-10-04: qty 5

## 2023-10-04 MED ORDER — METOPROLOL TARTRATE 5 MG/5ML IV SOLN
5.0000 mg | Freq: Once | INTRAVENOUS | Status: DC
  Filled 2023-10-04: qty 5

## 2023-10-04 MED ORDER — HEPARIN (PORCINE) 25000 UT/250ML-% IV SOLN
750.0000 [IU]/h | INTRAVENOUS | Status: DC
Start: 2023-10-04 — End: 2023-10-07
  Administered 2023-10-04 – 2023-10-07 (×3): 750 [IU]/h via INTRAVENOUS
  Filled 2023-10-04 (×3): qty 250

## 2023-10-04 MED ORDER — AMIODARONE HCL IN DEXTROSE 360-4.14 MG/200ML-% IV SOLN
60.0000 mg/h | INTRAVENOUS | Status: AC
  Administered 2023-10-04 (×2): 60 mg/h via INTRAVENOUS
  Filled 2023-10-04 (×2): qty 200

## 2023-10-04 MED ORDER — HEPARIN BOLUS VIA INFUSION
2500.0000 [IU] | Freq: Once | INTRAVENOUS | Status: AC
  Administered 2023-10-04: 2500 [IU] via INTRAVENOUS
  Filled 2023-10-04: qty 2500

## 2023-10-04 NOTE — Progress Notes (Signed)
 PROGRESS NOTE    Tracey [name]  FMW:[id number] DOB: 1940-09-02 DOA: 10/01/2023 PCP: Mast, Man X, NP  Outpatient Specialists:     Brief Narrative:  As per H&P done on admission: Tracey [name] is a 83 y.o. female with medical history significant for cognitive impairment, bipolar disorder, possible Parkinsons disease and h/o rection of GIST tumor presents with complaints of abdominal pain nausea vomiting and a lot of abdominal distention.  The symptoms have been going on for 2 to 3 days.  They were consistent with constipation but the patient took multiple laxatives which did not improve the situation. In the emergency department the patient had a CT scan of her abd/ pelvis revealed high-grade small bowel obstruction with transition point in the pelvis to the right of midline.  Mild mesenteric edema and small to moderate free fluid reported.   General surgery was called by the ED provider who recommended that the hospitalist admit her as she is on multiple medications.   10/03/2023: Patient seen.  No significant history from patient.  No new complaints.  No bowel movement of flatus.  NG tube to low intermittent wall suction.  General surgery team input is appreciated.  Patient is currently being worked closely.     10/04/2023: Patient went into atrial fibrillation RVR, with heart rate of 146 bpm.  Systolic blood pressure also dropped from 146 to the 90s.  Patient will be transferred to stepdown unit.  Discussed with cardiology team, Dr. Inocencio, patient will be started on amiodarone  and heparin  drip.  Patient's son updated.  Communicated above to the surgical team, Dr. Bernarda Ned.  Check lactic acid level, procalcitonin, cycle cardiac enzymes, cardiac BNP, and have a low threshold to proceed with CT chest and possible antibiotics.  Patient is a high risk patient for surgery, and surgery is a high risk surgery.  Surgery team prefers conservative management of SBO (small bowel obstruction) for  now.  Assessment & Plan:   Principal Problem:   SBO (small bowel obstruction) (HCC) Active Problems:   Difficulty hearing   Small bowel obstruction: - See above documentation. - NG tube to low intermittent wall suction. - Supportive care. - Adequate hydration. 10/04/2023: See above documentation.  Dementia: - No behavioral problems.  Volume depletion: - Optimize volume resuscitation.  Hypertension: - See above documentation.  Guarded prognosis.   DVT prophylaxis: Start subcutaneous Lovenox  Code Status: DNR Family Communication:  Disposition Plan: This will depend on hospital course   Consultants:  Neurosurgery  Procedures:  NG tube to low intermittent wall suction  Antimicrobials:  None   Subjective: A-fib RVR. Drop in blood pressure.  Objective: Vitals:   10/03/23 0509 10/03/23 1356 10/03/23 2012 10/04/23 0530  BP: 124/70 126/63 135/65 (!) 146/75  Pulse: 81 65 70 70  Resp: 16  16 16   Temp: 99.1 F (37.3 C) 98.2 F (36.8 C) 98.4 F (36.9 C) 98.4 F (36.9 C)  TempSrc: Oral Oral Oral Oral  SpO2: 96% 95% 95% 97%  Weight:      Height:        Intake/Output Summary (Last 24 hours) at 10/04/2023 1137 Last data filed at 10/04/2023 0500 Gross per 24 hour  Intake 2508.48 ml  Output 1350 ml  Net 1158.48 ml   Filed Weights   10/02/23 1456  Weight: 49.9 kg    Examination:  General exam: Appears calm and comfortable  Respiratory system: Clear to auscultation.  Cardiovascular system: S1 & S2, irregularly irregular.  Gastrointestinal system: Abdomen is  nondistended Central nervous system: Awake and alert.    Data Reviewed: I have personally reviewed following labs and imaging studies  CBC: Recent Labs  Lab 10/01/23 1922 10/02/23 0546 10/03/23 0443 10/04/23 0540  WBC 3.4* 2.6* 6.4 6.1  NEUTROABS 2.4  --  4.8 4.0  HGB 13.2 12.7 12.4 12.5  HCT 41.4 39.7 39.6 42.1  MCV 101.0* 99.7 101.0* 104.7*  PLT 336 402* 308 310   Basic Metabolic  Panel: Recent Labs  Lab 10/01/23 1922 10/02/23 0546 10/03/23 0443 10/04/23 0540  NA 136 137 140 145  K 4.4 4.1 3.6 3.6  CL 99 99 98 104  CO2 21* 23 26 26   GLUCOSE 150* 153* 133* 95  BUN 36* 39* 52* 39*  CREATININE 1.06* 0.95 1.25* 1.06*  CALCIUM 11.0* 10.6* 10.6* 10.5*  MG  --  2.1 2.6* 2.5*  PHOS  --   --  2.8 1.8*   GFR: Estimated Creatinine Clearance: 32.2 mL/min (A) (by C-G formula based on SCr of 1.06 mg/dL (H)). Liver Function Tests: Recent Labs  Lab 10/01/23 1922 10/03/23 0443 10/04/23 0540  AST 22 18  --   ALT 10 7  --   ALKPHOS 82 65  --   BILITOT 1.1 0.6  --   PROT 7.2 6.5  --   ALBUMIN  4.4 3.7 3.6   No results for input(s): LIPASE, AMYLASE in the last 168 hours. No results for input(s): AMMONIA in the last 168 hours. Coagulation Profile: No results for input(s): INR, PROTIME in the last 168 hours. Cardiac Enzymes: No results for input(s): CKTOTAL, CKMB, CKMBINDEX, TROPONINI in the last 168 hours. BNP (last 3 results) No results for input(s): PROBNP in the last 8760 hours. HbA1C: No results for input(s): HGBA1C in the last 72 hours. CBG: No results for input(s): GLUCAP in the last 168 hours. Lipid Profile: No results for input(s): CHOL, HDL, LDLCALC, TRIG, CHOLHDL, LDLDIRECT in the last 72 hours. Thyroid  Function Tests: Recent Labs    10/02/23 0546  TSH 0.509   Anemia Panel: No results for input(s): VITAMINB12, FOLATE, FERRITIN, TIBC, IRON , RETICCTPCT in the last 72 hours. Urine analysis:    Component Value Date/Time   COLORURINE STRAW (A) 01/12/2020 2103   APPEARANCEUR CLEAR 01/12/2020 2103   LABSPEC 1.012 01/12/2020 2103   PHURINE 6.0 01/12/2020 2103   GLUCOSEU NEGATIVE 01/12/2020 2103   HGBUR NEGATIVE 01/12/2020 2103   BILIRUBINUR NEGATIVE 01/12/2020 2103   KETONESUR NEGATIVE 01/12/2020 2103   PROTEINUR NEGATIVE 01/12/2020 2103   NITRITE NEGATIVE 01/12/2020 2103   LEUKOCYTESUR NEGATIVE  01/12/2020 2103   Sepsis Labs: @LABRCNTIP (procalcitonin:4,lacticidven:4)  )No results found for this or any previous visit (from the past 240 hours).       Radiology Studies: DG Chest Port 1 View Result Date: 10/04/2023 CLINICAL DATA:  NG tube placement. EXAM: PORTABLE CHEST 1 VIEW COMPARISON:  08/06/2023 FINDINGS: The NG tube is looped back on itself in the hypopharynx and does not extend down the esophagus. No pulmonary edema or focal airspace consolidation. Minimal basilar atelectasis with no substantial pleural effusion. Cardiopericardial silhouette is at upper limits of normal for size. Telemetry leads overlie the chest. IMPRESSION: NG tube is looped back on itself in the hypopharynx and does not extend down the esophagus. Tube should be removed and replaced. Repeat imaging after repositioning recommended. These results will be called to the ordering clinician or representative by the Radiologist Assistant, and communication documented in the PACS or [employer]. Electronically Signed   By: Camellia  Minus M.D.   On: 10/04/2023 11:18   DG Abd Portable 1V Result Date: 10/04/2023 CLINICAL DATA:  Small bowel obstruction. EXAM: PORTABLE ABDOMEN - 1 VIEW COMPARISON:  10/03/2023 FINDINGS: NG tube tip is positioned in the distal stomach. Diffuse gaseous small bowel dilatation persists without substantial change. Small bowel loops are dilated up to 5.9 cm in the mid abdomen. IMPRESSION: Persistent diffuse gaseous small bowel dilatation compatible with small bowel obstruction. Electronically Signed   By: Camellia Minus M.D.   On: 10/04/2023 08:06   DG Abd 1 View Result Date: 10/03/2023 CLINICAL DATA:  83 year old female with high-grade small bowel obstruction on CT Abdomen and Pelvis 2 days ago. Enteric tube in place. Oral contrast administered. EXAM: ABDOMEN - 1 VIEW COMPARISON:  10/02/2023 comments are body CT 10/01/2023. FINDINGS: AP view at 0419 hours. Stable enteric tube, side hole the level of  the gastric body. Continued gas distended small bowel loops throughout the mid and lower abdomen. Smaller volume of excreted IV contrast in the urinary bladder. Proximal small bowel oral contrast was better demonstrated yesterday. The large bowel is present as seen on recent CT. But no large bowel contrast is identified. Gas pattern does not appear improved since 10/01/2023. Lung bases appear stable, negative. No acute osseous abnormality identified. Thoracolumbar scoliosis. IMPRESSION: 1. Small bowel obstruction pattern with no improvement since the CT on 10/01/2023. Oral contrast identified in the large bowel. 2. Stable enteric tube, side hole at the mid stomach. Electronically Signed   By: VEAR Hurst M.D.   On: 10/03/2023 04:49   DG Abd Portable 1V-Small Bowel Obstruction Protocol-initial, 8 hr delay Result Date: 10/02/2023 CLINICAL DATA:  Small bowel obstruction EXAM: PORTABLE ABDOMEN - 1 VIEW COMPARISON:  10/01/2023 plain films and CT. FINDINGS: NG tube is in the stomach. Dilated small bowel loops noted in the left abdomen and pelvis compatible with small bowel obstruction. No organomegaly or free air. IMPRESSION: Small bowel obstruction pattern. NG tube in the distal stomach. Electronically Signed   By: Franky Crease M.D.   On: 10/02/2023 14:16        Scheduled Meds:  cycloSPORINE   1 drop Both Eyes BID   latanoprost   1 drop Both Eyes QHS   lithium  carbonate  150 mg Per Tube Q2000   timolol   1 drop Both Eyes Daily   topiramate   25 mg Oral BID   Continuous Infusions:  0.9 % NaCl with KCl 20 mEq / L 75 mL/hr at 10/03/23 2114   lactated ringers  100 mL/hr at 10/03/23 2102   potassium PHOSPHATE  IVPB (in mmol)       LOS: 3 days    Time spent: 55 minutes    Leatrice Chapel, MD  [facility] Pager #: [phone] 7PM-7AM contact night coverage as above

## 2023-10-04 NOTE — Progress Notes (Signed)
 [facility] Progress Note     Subjective: CC:  Denies flatus or BM. Reports nausea.  She pulled out again this am.    Objective: Vital signs in last 24 hours: Temp:  [98.2 F (36.8 C)-98.4 F (36.9 C)] 98.4 F (36.9 C) (09/20 0530) Pulse Rate:  [65-70] 70 (09/20 0530) Resp:  [16] 16 (09/20 0530) BP: (126-146)/(63-75) 146/75 (09/20 0530) SpO2:  [95 %-97 %] 97 % (09/20 0530) Last BM Date : 09/29/23  Intake/Output from previous day: 09/19 0701 - 09/20 0700 In: 2970.6 [P.O.:30; I.V.:2850.6; NG/GT:90] Out: 1700 [Urine:950; Emesis/NG output:750] Intake/Output this shift: No intake/output data recorded.  PE: Gen:  Alert, NAD, pleasant Abd: Soft, moderately distended, minimally tender across the lower abdomen without rebound tenderness or guarding. No hernias or masses   Lab Results:  Recent Labs    10/03/23 0443 10/04/23 0540  WBC 6.4 6.1  HGB 12.4 12.5  HCT 39.6 42.1  PLT 308 310   BMET Recent Labs    10/03/23 0443 10/04/23 0540  NA 140 145  K 3.6 3.6  CL 98 104  CO2 26 26  GLUCOSE 133* 95  BUN 52* 39*  CREATININE 1.25* 1.06*  CALCIUM 10.6* 10.5*   PT/INR No results for input(s): LABPROT, INR in the last 72 hours. CMP     Component Value Date/Time   NA 145 10/04/2023 0540   NA 139 01/26/2020 0000   NA 141 04/24/2012 0929   K 3.6 10/04/2023 0540   K 4.4 04/24/2012 0929   CL 104 10/04/2023 0540   CL 109 (H) 04/24/2012 0929   CO2 26 10/04/2023 0540   CO2 23 04/24/2012 0929   GLUCOSE 95 10/04/2023 0540   GLUCOSE 85 04/24/2012 0929   BUN 39 (H) 10/04/2023 0540   BUN 19 01/26/2020 0000   BUN 20.6 04/24/2012 0929   CREATININE 1.06 (H) 10/04/2023 0540   CREATININE 0.79 04/18/2023 1524   CREATININE 0.9 04/24/2012 0929   CALCIUM 10.5 (H) 10/04/2023 0540   CALCIUM 10.5 (H) 01/12/2020 2123   CALCIUM 10.0 04/24/2012 0929   PROT 6.5 10/03/2023 0443   PROT 6.7 04/24/2012 0929   ALBUMIN  3.6 10/04/2023 0540   ALBUMIN  3.5 04/24/2012 0929    AST 18 10/03/2023 0443   AST 20 04/24/2012 0929   ALT 7 10/03/2023 0443   ALT 12 04/24/2012 0929   ALKPHOS 65 10/03/2023 0443   ALKPHOS 75 04/24/2012 0929   BILITOT 0.6 10/03/2023 0443   BILITOT 0.36 04/24/2012 0929   GFRNONAA 52 (L) 10/04/2023 0540   GFRNONAA 58 (L) 05/11/2020 0700   GFRAA 68 05/11/2020 0700   Lipase     Component Value Date/Time   LIPASE 22 09/18/2013 1810       Studies/Results: DG Abd Portable 1V Result Date: 10/04/2023 CLINICAL DATA:  Small bowel obstruction. EXAM: PORTABLE ABDOMEN - 1 VIEW COMPARISON:  10/03/2023 FINDINGS: NG tube tip is positioned in the distal stomach. Diffuse gaseous small bowel dilatation persists without substantial change. Small bowel loops are dilated up to 5.9 cm in the mid abdomen. IMPRESSION: Persistent diffuse gaseous small bowel dilatation compatible with small bowel obstruction. Electronically Signed   By: Camellia Candle M.D.   On: 10/04/2023 08:06   DG Abd 1 View Result Date: 10/03/2023 CLINICAL DATA:  83 year old female with high-grade small bowel obstruction on CT Abdomen and Pelvis 2 days ago. Enteric tube in place. Oral contrast administered. EXAM: ABDOMEN - 1 VIEW COMPARISON:  10/02/2023 comments are body CT 10/01/2023. FINDINGS:  AP view at 0419 hours. Stable enteric tube, side hole the level of the gastric body. Continued gas distended small bowel loops throughout the mid and lower abdomen. Smaller volume of excreted IV contrast in the urinary bladder. Proximal small bowel oral contrast was better demonstrated yesterday. The large bowel is present as seen on recent CT. But no large bowel contrast is identified. Gas pattern does not appear improved since 10/01/2023. Lung bases appear stable, negative. No acute osseous abnormality identified. Thoracolumbar scoliosis. IMPRESSION: 1. Small bowel obstruction pattern with no improvement since the CT on 10/01/2023. Oral contrast identified in the large bowel. 2. Stable enteric tube, side  hole at the mid stomach. Electronically Signed   By: VEAR Hurst M.D.   On: 10/03/2023 04:49   DG Abd Portable 1V-Small Bowel Obstruction Protocol-initial, 8 hr delay Result Date: 10/02/2023 CLINICAL DATA:  Small bowel obstruction EXAM: PORTABLE ABDOMEN - 1 VIEW COMPARISON:  10/01/2023 plain films and CT. FINDINGS: NG tube is in the stomach. Dilated small bowel loops noted in the left abdomen and pelvis compatible with small bowel obstruction. No organomegaly or free air. IMPRESSION: Small bowel obstruction pattern. NG tube in the distal stomach. Electronically Signed   By: Franky Crease M.D.   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 Continue NG To LIWS and await bowel function.  - failure to improve may warrant diagnostic laparoscopy vs laparotomy this admission, no role for emergent surgery this morning.  Per [facility] --  Dementia  GERD Termors  HTN  Anxiety    LOS: 3 days   I reviewed nursing notes, ED provider notes, hospitalist notes, last 24 h vitals and pain scores, last 48 h intake and output, last 24 h labs and trends, and last 24 h imaging results.  This care required moderate level of medical decision making.   Bernarda JAYSON Ned, MD  Colorectal and General Surgery [facility]

## 2023-10-04 NOTE — Progress Notes (Signed)
 0800 NG tube accidentally removed while patient up to use BSC.   09:45- Re inserted NG tube into left nare. Received call from telemetry notified that patient converted to AFIB with RVR - HR sustaining in 140s. Notified Dr. Rosario or conversion and elevated HR. Assumed likely due to discomfort of NG placement.   10:05- Xray to confirm placement showed NG looped back onto itself. Immediately removed NG tube.   11:30- Re inserted NG tube info right nare. Xray confirmed proper placement.   Dr. Rosario at bedside rounding - EKG performed confirming AFIB with RVR HR in 140s. Ordered one time IVP metoprolol  5 mg - held and not given due to hypotension BP of 93/79. Plan to transfer to stepdown. Cardology has been consulted

## 2023-10-04 NOTE — Progress Notes (Signed)
 PT Cancellation Note  Patient Details Name: VERONCIA JEZEK MRN: [id number] DOB: [birth year]   Cancelled Treatment:    Reason Eval/Treat Not Completed: Medical issues which prohibited therapy HR 140-150s this morning and then upon checking back, pt actively having NG tube replaced and xray.  Will check back as schedule permits.   Tari CROME [place] 10/04/2023, 11:47 AM Tari KLEIN, DPT Physical Therapist Acute Rehabilitation Services Office: [phone]

## 2023-10-04 NOTE — Progress Notes (Addendum)
  Echocardiogram 2D Echocardiogram has been performed.  Tracey [name] 10/04/2023, 3:37 PM

## 2023-10-04 NOTE — Progress Notes (Signed)
 PHARMACY - ANTICOAGULATION CONSULT NOTE  Pharmacy Consult for heparin   Indication: atrial fibrillation  Allergies  Allergen Reactions   Lisinopril Cough   Penicillins Itching and Other (See Comments)    50 years ago   Adhesive [Tape] Itching   Atorvastatin Itching   Dilaudid  [Hydromorphone  Hcl] Itching    Patient Measurements: Height: 5' 2 (157.5 cm) Weight: 49.9 kg (110 lb 0.2 oz) IBW/kg (Calculated) : 50.1 HEPARIN  DW (KG): 49.9  Vital Signs: Temp: 98 F (36.7 C) (09/20 1230) Temp Source: Oral (09/20 1230) BP: 93/79 (09/20 1230) Pulse Rate: 144 (09/20 1230)  Labs: Recent Labs    10/02/23 0546 10/03/23 0443 10/04/23 0540  HGB 12.7 12.4 12.5  HCT 39.7 39.6 42.1  PLT 402* 308 310  CREATININE 0.95 1.25* 1.06*    Estimated Creatinine Clearance: 32.2 mL/min (A) (by C-G formula based on SCr of 1.06 mg/dL (H)).   Medical History: 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, possible    Tremors possible parkinsons   Manic disorder, single episode, in full remission (HCC) 12/01/2009   Sixth nerve palsy     Medications:  Medications Prior to Admission  Medication Sig Dispense Refill Last Dose/Taking   acetaminophen  (TYLENOL ) 325 MG tablet Take 650 mg by mouth every 4 (four) hours as needed (for pain).   10/01/2023 at  9:04 AM   ALPRAZolam  (XANAX ) 0.5 MG tablet Take 0.5 mg by mouth 3 (three) times daily.   10/01/2023 at  12:00 PM   aspirin  81 MG chewable tablet Chew 81 mg by mouth daily.   10/01/2023 at  7:00 AM   cycloSPORINE  (RESTASIS ) 0.05 % ophthalmic emulsion Place 1 drop into both eyes 2 (two) times daily.   10/01/2023 at  7:00 AM   lactose free nutrition (BOOST) LIQD Take 237 mLs by mouth See admin instructions. Drink 237 ml's by mouth in the morning and afternoon   10/01/2023 at  8:00 AM   latanoprost  (XALATAN ) 0.005 % ophthalmic solution Place 1 drop into both eyes at bedtime.   09/30/2023   lithium  carbonate 150 MG capsule TAKE ONE CAPSULE BY MOUTH TWICE DAILY WITH MEALS 180 capsule 0 10/01/2023 at  5:00 PM   losartan  (COZAAR ) 50 MG tablet TAKE ONE TABLET BY MOUTH ONCE DAILY 90 tablet 5 10/01/2023 at  7:00 AM   ondansetron  (ZOFRAN ) 4 MG tablet Take 4 mg by mouth every 6 (six) hours as needed for nausea.   Unknown   polyethylene glycol (MIRALAX  / GLYCOLAX ) 17 g packet Take 17 g by mouth every other day.   09/27/2023   pravastatin  (PRAVACHOL ) 10 MG tablet Take 5 mg by mouth every evening.   10/01/2023 Evening   SYSTANE 0.4-0.3 % SOLN Place 1 drop into both eyes every 4 (four) hours as needed (for dryness).   Unknown   Timolol  Maleate, Once-Daily, 0.5 % SOLN Place 1 drop into both  eyes daily.   10/01/2023 at  7:00 AM   topiramate  (TOPAMAX ) 25 MG tablet TAKE ONE TABLET BY MOUTH TWICE DAILY IN THE MORNING AND IN THE EVENING (Patient taking differently: Take 25 mg by mouth See admin instructions. Take 25 mg by mouth at 7 AM and 4 PM) 180 tablet 2 10/01/2023 at  4:00 PM   White Petrolatum-Mineral Oil (SYSTANE NIGHTTIME) OINT Place 1 application  into both eyes at bedtime.   09/30/2023   amoxicillin (AMOXIL) 500 MG tablet Take 2,000 mg by mouth as directed. 1 hour prior to dental procedure per [facility]   Unknown    Assessment: Pharmacy consulted to dose heparin  in 83 yo F admitted with a high grade pSBO.  Heparin  to begin for Afib.  No anticoagulants PTA.  CBC WNL, SCr 1.06 No bleeding  reported  Goal of Therapy:  Heparin  level 0.3-0.7 units/ml Monitor platelets by anticoagulation protocol: Yes   Plan:  Heparin  2500 unit bolus IV x 1 Heparin  drip 750 units/hr Check 8 hour heparin  level Daily CBC & heparin  level  Rosaline IVAR Edison, Pharm.D Use secure chat for questions 10/04/2023 1:43 PM

## 2023-10-05 ENCOUNTER — Inpatient Hospital Stay (HOSPITAL_COMMUNITY)

## 2023-10-05 DIAGNOSIS — K56609 Unspecified intestinal obstruction, unspecified as to partial versus complete obstruction: Secondary | ICD-10-CM | POA: Diagnosis not present

## 2023-10-05 LAB — CBC WITH DIFFERENTIAL/PLATELET
Abs Immature Granulocytes: 0.05 K/uL (ref 0.00–0.07)
Basophils Absolute: 0 K/uL (ref 0.0–0.1)
Basophils Relative: 1 %
Eosinophils Absolute: 0 K/uL (ref 0.0–0.5)
Eosinophils Relative: 0 %
HCT: 39.4 % (ref 36.0–46.0)
Hemoglobin: 11.9 g/dL — ABNORMAL LOW (ref 12.0–15.0)
Immature Granulocytes: 1 %
Lymphocytes Relative: 27 %
Lymphs Abs: 1.4 K/uL (ref 0.7–4.0)
MCH: 31.2 pg (ref 26.0–34.0)
MCHC: 30.2 g/dL (ref 30.0–36.0)
MCV: 103.4 fL — ABNORMAL HIGH (ref 80.0–100.0)
Monocytes Absolute: 0.8 K/uL (ref 0.1–1.0)
Monocytes Relative: 16 %
Neutro Abs: 3 K/uL (ref 1.7–7.7)
Neutrophils Relative %: 55 %
Platelets: 314 K/uL (ref 150–400)
RBC: 3.81 MIL/uL — ABNORMAL LOW (ref 3.87–5.11)
RDW: 13.5 % (ref 11.5–15.5)
WBC: 5.4 K/uL (ref 4.0–10.5)
nRBC: 0 % (ref 0.0–0.2)

## 2023-10-05 LAB — RENAL FUNCTION PANEL
Albumin: 3.1 g/dL — ABNORMAL LOW (ref 3.5–5.0)
Anion gap: 15 (ref 5–15)
BUN: 28 mg/dL — ABNORMAL HIGH (ref 8–23)
CO2: 22 mmol/L (ref 22–32)
Calcium: 9.5 mg/dL (ref 8.9–10.3)
Chloride: 110 mmol/L (ref 98–111)
Creatinine, Ser: 0.86 mg/dL (ref 0.44–1.00)
GFR, Estimated: >60 mL/min (ref >60)
Glucose, Bld: 106 mg/dL — ABNORMAL HIGH (ref 70–99)
Phosphorus: 2.2 mg/dL — ABNORMAL LOW (ref 2.5–4.6)
Potassium: 3.7 mmol/L (ref 3.5–5.1)
Sodium: 147 mmol/L — ABNORMAL HIGH (ref 135–145)

## 2023-10-05 LAB — MAGNESIUM: Magnesium: 2.6 mg/dL — ABNORMAL HIGH (ref 1.7–2.4)

## 2023-10-05 LAB — HEPARIN LEVEL (UNFRACTIONATED): Heparin Unfractionated: 0.33 [IU]/mL (ref 0.30–0.70)

## 2023-10-05 LAB — TSH: TSH: 1.15 u[IU]/mL (ref 0.350–4.500)

## 2023-10-05 MED ORDER — POTASSIUM PHOSPHATES 15 MMOLE/5ML IV SOLN
15.0000 mmol | Freq: Once | INTRAVENOUS | Status: AC
  Administered 2023-10-05: 15 mmol via INTRAVENOUS
  Filled 2023-10-05: qty 5

## 2023-10-05 MED ORDER — LORAZEPAM 2 MG/ML IJ SOLN
0.5000 mg | INTRAMUSCULAR | Status: DC | PRN
  Administered 2023-10-05 – 2023-10-25 (×35): 0.5 mg via INTRAVENOUS
  Filled 2023-10-05 (×39): qty 1

## 2023-10-05 MED ORDER — KCL IN DEXTROSE-NACL 20-5-0.9 MEQ/L-%-% IV SOLN
INTRAVENOUS | Status: DC
Start: 2023-10-05 — End: 2023-10-06
  Filled 2023-10-05 (×2): qty 1000

## 2023-10-05 NOTE — Progress Notes (Signed)
 10/04/2023 2140 Patient called out for help, stated she couldn't breathe. Continued atrial fibrillation on monitor, rate increased to 130's, tachypneic, shallow respirations rate mid 30s-40s. BP slightly decreased.  Placed [place] at 3 liters per minute and elevated HOB. I&O cath needed at present due to urinary retention, abdomen remains distended, with NGT to LIS. Increased drainage from NGT noted after flushing. Patient asking for anxiety med (less than 6 hours from last PRN dose of Ativan ).  Patient subsequently calmed down and was able to have HOB lowered sufficiently for I&O cath with 1200cc returned. Condition continued to improve and patient ultimately fell asleep. Cindy S. Loreli BSN, RN, [employer], CCRN 10/05/2023 12:48 AM

## 2023-10-05 NOTE — Plan of Care (Signed)
  Problem: Education: Goal: Knowledge of General Education information will improve Description: Including pain rating scale, medication(s)/side effects and non-pharmacologic comfort measures Outcome: Progressing   Problem: Health Behavior/Discharge Planning: Goal: Ability to manage health-related needs will improve Outcome: Progressing   Problem: Clinical Measurements: Goal: Ability to maintain clinical measurements within normal limits will improve Outcome: Progressing Goal: Will remain free from infection Outcome: Progressing Goal: Diagnostic test results will improve Outcome: Progressing Goal: Respiratory complications will improve Outcome: Progressing Goal: Cardiovascular complication will be avoided Outcome: Progressing   Problem: Coping: Goal: Level of anxiety will decrease Outcome: Progressing   Problem: Elimination: Goal: Will not experience complications related to bowel motility Outcome: Progressing Goal: Will not experience complications related to urinary retention Outcome: Progressing   Problem: Pain Managment: Goal: General experience of comfort will improve and/or be controlled Outcome: Progressing   Problem: Safety: Goal: Ability to remain free from injury will improve Outcome: Progressing   Problem: Skin Integrity: Goal: Risk for impaired skin integrity will decrease Outcome: Progressing    Problem: Activity: Goal: Risk for activity intolerance will decrease Outcome: Not Progressing   Problem: Nutrition: Goal: Adequate nutrition will be maintained Outcome: Not Progressing   Cindy S. Loreli BSN, RN, [employer], CCRN 10/05/2023 5:49 AM

## 2023-10-05 NOTE — Progress Notes (Signed)
 PHARMACY - ANTICOAGULATION CONSULT NOTE  Pharmacy Consult for heparin   Indication: atrial fibrillation  Allergies  Allergen Reactions   Lisinopril Cough   Penicillins Itching and Other (See Comments)    50 years ago   Adhesive [Tape] Itching   Atorvastatin Itching   Dilaudid  [Hydromorphone  Hcl] Itching    Patient Measurements: Height: 5' 2 (157.5 cm) Weight: 49.9 kg (110 lb 0.2 oz) IBW/kg (Calculated) : 50.1 HEPARIN  DW (KG): 49.9  Vital Signs: Temp: 98.7 F (37.1 C) (09/20 1837) Temp Source: Oral (09/20 1837) BP: 113/78 (09/20 2029) Pulse Rate: 68 (09/20 2029)  Labs: Recent Labs    10/02/23 0546 10/03/23 0443 10/04/23 0540 10/04/23 2335  HGB 12.7 12.4 12.5  --   HCT 39.7 39.6 42.1  --   PLT 402* 308 310  --   HEPARINUNFRC  --   --   --  0.40  CREATININE 0.95 1.25* 1.06*  --     Estimated Creatinine Clearance: 32.2 mL/min (A) (by C-G formula based on SCr of 1.06 mg/dL (H)).   Medical History: 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, possible    Tremors possible parkinsons   Manic disorder, single episode, in full remission (HCC) 12/01/2009   Sixth nerve palsy     Medications:  Medications Prior to Admission  Medication Sig Dispense Refill Last Dose/Taking   acetaminophen  (TYLENOL ) 325 MG tablet Take 650 mg by mouth every 4 (four) hours as needed (for pain).   10/01/2023 at  9:04 AM   ALPRAZolam  (XANAX ) 0.5  MG tablet Take 0.5 mg by mouth 3 (three) times daily.   10/01/2023 at 12:00 PM   aspirin  81 MG chewable tablet Chew 81 mg by mouth daily.   10/01/2023 at  7:00 AM   cycloSPORINE  (RESTASIS ) 0.05 % ophthalmic emulsion Place 1 drop into both eyes 2 (two) times daily.   10/01/2023 at  7:00 AM   lactose free nutrition (BOOST) LIQD Take 237 mLs by mouth See admin instructions. Drink 237 ml's by mouth in the morning and afternoon   10/01/2023 at  8:00 AM   latanoprost  (XALATAN ) 0.005 % ophthalmic solution Place 1 drop into both eyes at bedtime.   09/30/2023   lithium  carbonate 150 MG capsule TAKE ONE CAPSULE BY MOUTH TWICE DAILY WITH MEALS 180 capsule 0 10/01/2023 at  5:00 PM   losartan  (COZAAR ) 50 MG tablet TAKE ONE TABLET BY MOUTH ONCE DAILY 90 tablet 5 10/01/2023 at  7:00 AM   ondansetron  (ZOFRAN ) 4 MG tablet Take 4 mg by mouth every 6 (six) hours as needed for nausea.   Unknown   polyethylene glycol (MIRALAX  / GLYCOLAX ) 17 g packet Take 17 g by mouth every other day.   09/27/2023   pravastatin  (PRAVACHOL ) 10 MG tablet Take 5 mg by mouth every evening.   10/01/2023 Evening   SYSTANE 0.4-0.3 % SOLN Place 1 drop into  both eyes every 4 (four) hours as needed (for dryness).   Unknown   Timolol  Maleate, Once-Daily, 0.5 % SOLN Place 1 drop into both eyes daily.   10/01/2023 at  7:00 AM   topiramate  (TOPAMAX ) 25 MG tablet TAKE ONE TABLET BY MOUTH TWICE DAILY IN THE MORNING AND IN THE EVENING (Patient taking differently: Take 25 mg by mouth See admin instructions. Take 25 mg by mouth at 7 AM and 4 PM) 180 tablet 2 10/01/2023 at  4:00 PM   White Petrolatum-Mineral Oil (SYSTANE NIGHTTIME) OINT Place 1 application  into both eyes at bedtime.   09/30/2023   amoxicillin (AMOXIL) 500 MG tablet Take 2,000 mg by mouth as directed. 1 hour prior to dental procedure per [facility] from [facility]   Unknown    Assessment: Pharmacy consulted to dose heparin  in 83 yo F admitted with a high grade pSBO.  Heparin  to begin for Afib.   No anticoagulants PTA.  CBC WNL, SCr 1.06 No bleeding reported  1st HL 0.4, therapeutic on 750 units/hr No complications of therapy noted  Goal of Therapy:  Heparin  level 0.3-0.7 units/ml Monitor platelets by anticoagulation protocol: Yes   Plan:  Continue Heparin  drip at 750 units/hr Confirmatory heparin  level in 8 hours Daily CBC & heparin  level  Leeroy Mace RPh 10/05/2023, 12:22 AM

## 2023-10-05 NOTE — Plan of Care (Signed)
  Problem: Clinical Measurements: Goal: Ability to maintain clinical measurements within normal limits will improve Outcome: Not Progressing Goal: Respiratory complications will improve Outcome: Progressing Goal: Cardiovascular complication will be avoided Outcome: Not Progressing   Problem: Nutrition: Goal: Adequate nutrition will be maintained Outcome: Not Progressing   Problem: Elimination: Goal: Will not experience complications related to bowel motility Outcome: Not Progressing Goal: Will not experience complications related to urinary retention Outcome: Not Progressing

## 2023-10-05 NOTE — Consult Note (Signed)
 Reason for Consult: Atrial fibrillation with rapid ventricular response Referring Physician: [facility] hospitalist  Tracey [name] is an 83 y.o. female.  HPI: Patient is a 83 year old female with past medical history significant for bipolar disorder, history of Parkinson's disease, history of multiple laparotomies in the past, history of gastrointestinal stromal tumor resection in the past, degenerative joint disease, anxiety disorder, was admitted because of abdominal pain associate with nausea vomiting and abdominal distention for last few days and was noted to have small bowel obstruction cardiology consultation was called as patient was noted to be in A-fib with RVR.  Patient is being treated conservatively with NG suction and n.p.o. with improvement in her symptoms cardiology consultation is called as patient was noted to be in A-fib with RVR.  Patient was started on IV heparin  and received IV amiodarone  bolus and started on the drip with control of heart rate and 90s.  Patient presently denies any complaints but complains of feeling thirsty denies any abdominal pain chest pain or shortness of breath.  Reviewed 2D echo which showed normal LV systolic function with no wall motion abnormalities  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, possible    Tremors possible parkinsons   Manic disorder, single episode, in full remission (HCC) 12/01/2009   Sixth  nerve palsy     Past Surgical History:  Procedure Laterality Date   BILATERAL SALPINGOOPHORECTOMY  09/22/2007   CATARACT EXTRACTION Bilateral    ESOPHAGOGASTRODUODENOSCOPY (EGD) WITH PROPOFOL  N/A 03/25/2022   Procedure: ESOPHAGOGASTRODUODENOSCOPY (EGD) WITH PROPOFOL ;  Surgeon: Legrand Victory LITTIE DOUGLAS, MD;  Location: [facility];  Service: Gastroenterology;  Laterality: N/A;   Gastrointestinal Stroma Tumor,  Other  1960   GIST Surgery   ILEOCECETOMY  09/22/2007   OVARIAN CYST REMOVAL Right 1967   SMALL INTESTINE SURGERY  09/22/2007   TOTAL KNEE ARTHROPLASTY Right 10/05/2019   Procedure: RIGHT TOTAL KNEE ARTHROPLASTY;  Surgeon: Addie Cordella Hamilton, MD;  Location: [facility] OR;  Service: Orthopedics;  Laterality: Right;   TOTAL VAGINAL HYSTERECTOMY  1986   Fibroids    Family History  Problem Relation Age of Onset   Congestive Heart Failure Mother    Dementia Mother    Heart disease Father    Diabetes Father    Lung cancer Son    Liver disease Neg Hx    Esophageal cancer Neg Hx    Colon cancer Neg Hx     Social History:  reports that she has never smoked. She has never used smokeless tobacco. She reports that she does not drink alcohol  and does not use drugs.  Allergies:  Allergies  Allergen Reactions   Lisinopril Cough   Penicillins Itching and Other (See Comments)    50 years ago   Adhesive [Tape] Itching   Atorvastatin Itching   Dilaudid  [Hydromorphone  Hcl] Itching    Medications: I have reviewed the patient's current medications.  Results for orders placed or performed during the hospital encounter of 10/01/23 (from the past 48 hours)  Renal function panel     Status: Abnormal   Collection Time: 10/04/23  5:40 AM  Result Value Ref Range   Sodium 145 135 - 145 mmol/L   Potassium 3.6 3.5 - 5.1 mmol/L   Chloride 104 98 - 111 mmol/L   CO2 26 22 - 32 mmol/L   Glucose, Bld 95 70 - 99 mg/dL    Comment: Glucose reference range applies only to samples taken after fasting for at  least 8 hours.   BUN 39 (H) 8 - 23 mg/dL   Creatinine, Ser 8.93 (H) 0.44 - 1.00 mg/dL   Calcium 89.4 (H) 8.9 - 10.3 mg/dL   Phosphorus 1.8 (L) 2.5 - 4.6 mg/dL   Albumin  3.6 3.5 - 5.0 g/dL   GFR, Estimated 52 (L) >60 mL/min    Comment: (NOTE) Calculated using the CKD-EPI Creatinine Equation (2021)    Anion gap 15 5 - 15    Comment: Performed at [facility], [address] [place], [place] [address]  Magnesium      Status: Abnormal   Collection Time: 10/04/23  5:40 AM  Result Value Ref Range   Magnesium  2.5 (H) 1.7 - 2.4 mg/dL    Comment: Performed at [facility], [address] [place], [place] [address]  CBC with Differential/Platelet     Status: Abnormal   Collection Time: 10/04/23  5:40 AM  Result Value Ref Range   WBC 6.1 4.0 - 10.5 K/uL   RBC 4.02 3.87 - 5.11 MIL/uL   Hemoglobin 12.5 12.0 - 15.0 g/dL   HCT 57.8 63.9 - 53.9 %   MCV 104.7 (H) 80.0 - 100.0 fL   MCH 31.1 26.0 - 34.0 pg   MCHC 29.7 (L) 30.0 - 36.0 g/dL   RDW 86.7 88.4 - 84.4 %   Platelets 310 150 - 400 K/uL   nRBC 0.0 0.0 - 0.2 %   Neutrophils Relative % 67 %   Neutro Abs 4.0 1.7 - 7.7 K/uL   Lymphocytes Relative 17 %   Lymphs Abs 1.1 0.7 - 4.0 K/uL   Monocytes Relative 14 %   Monocytes Absolute 0.9 0.1 - 1.0 K/uL   Eosinophils Relative 0 %   Eosinophils Absolute 0.0 0.0 - 0.5 K/uL   Basophils Relative 1 %   Basophils Absolute 0.1 0.0 - 0.1 K/uL   WBC Morphology See Note     Comment: INCREASED BANDS (>20% BANDS)   Immature Granulocytes 1 %   Abs Immature Granulocytes 0.06 0.00 - 0.07 K/uL    Comment: Performed at [facility], [address] [place], [place] [address]  Lactic acid, plasma     Status: None   Collection Time: 10/04/23  1:10 PM  Result Value Ref Range   Lactic Acid, Venous 1.1 0.5 - 1.9 mmol/L    Comment: Performed at [facility], [address] [place], [place] [address]  Procalcitonin     Status: None    Collection Time: 10/04/23  1:10 PM  Result Value Ref Range   Procalcitonin 0.33 ng/mL    Comment: (NOTE)   Sepsis PCT Algorithm          Lower Respiratory Tract Infection  PCT Algorithm -----------------------------------------------------------------  <0.5 ng/mL                    <0.10 ng/mL  Associated with low           Antibiotic therapy strongly   risk for progression          discouraged. Indicates absence   to severe sepsis              of bacteria infection  and/or septic shock             --------------------------------------------------------------  0.5-2.0 ng/mL                 0.10-0.25 ng/mL  Recommended to retest         Antibiotic therapy discouraged.  PCT within 6-24 hours         Bacterial infection unlikely  ------------------------------------------------------------  >2 ng/mL                      0.26-0.50 ng/mL  Associated with high risk     Antibiotic therapy encouraged.  for progression to severe     Bacterial infection possible  sepsis/and or septic shock    ------------------------------                                 >0.50 ng/mL                                Antibiotic therapy strongly                                 encouraged.                                Suggestive of presence of                                 bacterial infection.                                 -------------------------------------------------------------------  < or = 0.50 ng/mL OR          < or = 0.25 OR 80% decrease in PCT  80% decrease in PCT           Antibiotic therapy   Antibiotic therapy may        may be discontinued  be discontinued                                 Performed at [facility], [address], [place], [place] [address]   Culture, blood (Routine X 2) w Reflex to ID Panel     Status: None (Preliminary result)   Collection Time: 10/04/23  1:10 PM   Specimen: BLOOD LEFT ARM  Result Value Ref Range    Specimen Description      BLOOD LEFT ARM Performed at [facility], [address], [place], [place] [address]    Special Requests      BOTTLES DRAWN AEROBIC ONLY Blood  Culture results may not be optimal due to an inadequate volume of blood received in culture bottles Performed at [facility], [address], [place], [place] [address]    Culture      NO GROWTH < 24 HOURS Performed at [facility], [address], [place], [place] [address]    Report Status PENDING   Culture, blood (Routine X 2) w Reflex to ID Panel     Status: None (Preliminary result)   Collection Time: 10/04/23  1:10 PM   Specimen: BLOOD LEFT HAND  Result Value Ref Range   Specimen Description      BLOOD LEFT HAND Performed at [facility], [address] [place], [place] [address]    Special Requests      BOTTLES DRAWN AEROBIC AND ANAEROBIC Blood Culture results may not be optimal due to an inadequate volume of blood received in culture bottles Performed at [facility], [address] [place], [place] [address]    Culture      NO GROWTH < 24 HOURS Performed at [facility], [address], [place], [place] [address]    Report Status PENDING   Troponin T, High Sensitivity     Status: Abnormal   Collection Time: 10/04/23  1:10 PM  Result Value Ref Range   Troponin T High Sensitivity 29 (H) 0 - 19 ng/L    Comment: (NOTE) Biotin concentrations > 1000 ng/mL falsely decrease TnT results.  Serial cardiac troponin measurements are suggested.  Refer to the Links section for chest pain algorithms and additional  guidance. Performed at [facility], [address], [place], [place] [address]   Pro Brain natriuretic peptide     Status: Abnormal   Collection Time: 10/04/23  1:10 PM  Result Value Ref Range   Pro Brain Natriuretic Peptide 529.0 (H) <300.0 pg/mL    Comment: (NOTE) Age Group        Cut-Points     Interpretation  < 50 years     450 pg/mL       NT-proBNP > 450 pg/mL indicates                                ADHF is likely              50 to 75 years  900 pg/mL      NT-proBNP > 900 pg/mL indicates          ADHF is likely  > 75 years      1800 pg/mL     NT-proBNP > 1800 pg/mL indicates          ADHF is likely                           All ages    Results between       Indeterminate. Further clinical             300 and the cut-   information is needed to determine            point for age group   if ADHF is present.  Elecsys proBNP II/ Elecsys proBNP II STAT           Cut-Point                       Interpretation  300 pg/mL                    NT-proBNP <300pg/mL indicates                             ADHF is not likely  Performed at [facility], [address] [place], [place] [address]   MRSA Next Gen by PCR, Nasal     Status: None   Collection Time: 10/04/23  2:48 PM   Specimen: Nasal Mucosa; Nasal Swab  Result Value Ref Range   MRSA by PCR Next Gen NOT DETECTED NOT DETECTED    Comment: (NOTE) The GeneXpert MRSA Assay (FDA approved for NASAL specimens only), is one component of a comprehensive MRSA colonization surveillance program. It is not intended to diagnose MRSA infection nor to guide or monitor treatment for MRSA infections. Test performance is not FDA approved in patients less than 42 years old. Performed at [facility], [address], [place], [place] [address]   Lactic acid, plasma     Status: None   Collection Time: 10/04/23  2:53 PM  Result Value Ref Range   Lactic Acid, Venous 0.9 0.5 - 1.9 mmol/L    Comment: Performed at [facility], [address], [place], [place] [address]  Troponin T, High Sensitivity     Status: Abnormal   Collection Time: 10/04/23  2:53 PM  Result Value Ref Range   Troponin T High Sensitivity 30 (H) 0 - 19  ng/L    Comment: (NOTE) Biotin concentrations > 1000 ng/mL falsely decrease TnT results.  Serial cardiac troponin measurements are suggested.  Refer to the Links section for chest pain algorithms and additional  guidance. Performed at [facility], [address], [place], [place] [address]   Heparin  level (unfractionated)     Status: None   Collection Time: 10/04/23 11:35 PM  Result Value Ref Range   Heparin  Unfractionated 0.40 0.30 - 0.70 IU/mL    Comment: (NOTE) The clinical reportable range upper limit is being lowered to >1.10 to align with the FDA approved guidance for the current laboratory assay.  If heparin  results are below expected values, and patient dosage has  been confirmed, suggest follow up testing of antithrombin III levels. Performed at [facility], [address] [place], [place] [address]   Renal function panel     Status: Abnormal   Collection Time: 10/05/23  3:09 AM  Result Value Ref Range   Sodium 147 (H) 135 - 145 mmol/L   Potassium 3.7 3.5 - 5.1 mmol/L   Chloride 110 98 - 111 mmol/L   CO2 22 22 - 32 mmol/L   Glucose, Bld 106 (H) 70 - 99 mg/dL    Comment: Glucose reference range applies only to samples taken after fasting for at least 8 hours.   BUN 28 (H) 8 - 23 mg/dL   Creatinine, Ser 9.13 0.44 - 1.00 mg/dL   Calcium 9.5 8.9 - 89.6 mg/dL   Phosphorus 2.2 (L) 2.5 - 4.6 mg/dL   Albumin  3.1 (L) 3.5 - 5.0 g/dL   GFR, Estimated >39 >39 mL/min    Comment: (NOTE) Calculated  using the CKD-EPI Creatinine Equation (2021)    Anion gap 15 5 - 15    Comment: Performed at [facility], [address], [place], [place] [address]  Magnesium      Status: Abnormal   Collection Time: 10/05/23  3:09 AM  Result Value Ref Range   Magnesium  2.6 (H) 1.7 - 2.4 mg/dL    Comment: Performed at [facility], [address], [place], [place] [address]  CBC with Differential/Platelet     Status:  Abnormal   Collection Time: 10/05/23  3:09 AM  Result Value Ref Range   WBC 5.4 4.0 - 10.5 K/uL   RBC 3.81 (L) 3.87 - 5.11 MIL/uL   Hemoglobin 11.9 (L) 12.0 - 15.0 g/dL   HCT 60.5 63.9 - 53.9 %   MCV 103.4 (H) 80.0 - 100.0 fL   MCH 31.2 26.0 - 34.0 pg   MCHC 30.2 30.0 - 36.0 g/dL   RDW 86.4 88.4 - 84.4 %   Platelets 314 150 - 400 K/uL   nRBC 0.0 0.0 - 0.2 %   Neutrophils Relative % 55 %   Neutro Abs 3.0 1.7 - 7.7 K/uL   Lymphocytes Relative 27 %   Lymphs Abs 1.4 0.7 - 4.0 K/uL   Monocytes Relative 16 %   Monocytes Absolute 0.8 0.1 - 1.0 K/uL   Eosinophils Relative 0 %   Eosinophils Absolute 0.0 0.0 - 0.5 K/uL   Basophils Relative 1 %   Basophils Absolute 0.0 0.0 - 0.1 K/uL   Immature Granulocytes 1 %   Abs Immature Granulocytes 0.05 0.00 - 0.07 K/uL    Comment: Performed at [facility], [address], [place], [place] [address]  Heparin  level (unfractionated)     Status: None   Collection Time: 10/05/23  8:16 AM  Result Value Ref Range   Heparin  Unfractionated 0.33 0.30 - 0.70 IU/mL    Comment: (NOTE) The clinical reportable range upper limit is being lowered to >1.10 to align with the FDA approved guidance for the current laboratory assay.  If heparin  results are below expected values, and patient dosage has  been confirmed, suggest follow up testing of antithrombin III levels. Performed at [facility], [address] [place], [place] [address]     CT CHEST ABDOMEN PELVIS WO CONTRAST Result Date: 10/05/2023 CLINICAL DATA:  Respiratory illness. Bowel obstruction. 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 EXAM: CT CHEST, ABDOMEN AND PELVIS WITHOUT CONTRAST TECHNIQUE: Multidetector CT imaging of the chest, abdomen and pelvis was performed following the standard protocol without IV contrast. RADIATION DOSE REDUCTION: This exam was performed according to the departmental dose-optimization program  which includes automated exposure control, adjustment of the mA and/or kV according to patient size and/or use of iterative reconstruction technique. COMPARISON:  Abdomen pelvis CT 10/01/2023.  Chest CT 04/24/2012 FINDINGS: CT CHEST FINDINGS Cardiovascular: The heart size is normal. No substantial pericardial effusion. Mild atherosclerotic calcification is noted in the wall of the thoracic aorta. Mediastinum/Nodes: No mediastinal lymphadenopathy. NG tube visualized in the esophagus. Mild circumferential wall thickening noted distal esophagus just proximal to a moderate hiatal hernia. No evidence for gross hilar lymphadenopathy although assessment is limited by the lack of intravenous contrast on the current study. The esophagus has normal imaging features. Lungs/Pleura: 7 mm posterior right upper lobe nodule on 51/6 is new in the long interval since previous chest CT. Small airway impaction with tree-in-bud nodularity and patchy airspace  disease noted in the posterior left upper lobe/lingula and dependent left lower lobe. Bronchiectasis with volume loss and consolidative opacity in the right middle lobe is progressive in the long interval since the prior study. There is some airway impaction and subtle clustered nodularity in the inferior right upper lobe. Patchy ill-defined nodular airspace opacity in the right lower lobe is associated with dependent collapse/consolidation. Small bilateral pleural effusions. Musculoskeletal: No worrisome lytic or sclerotic osseous abnormality. CT ABDOMEN PELVIS FINDINGS Hepatobiliary: Scattered tiny hypodensities in the liver parenchyma are too small to characterize but are statistically most likely benign. No followup imaging is recommended. High attenuation material in the lumen of the gallbladder is compatible with vicarious excretion of intravenous contrast material. No intrahepatic or extrahepatic biliary dilation. Pancreas: No focal mass lesion. No dilatation of the main  duct. No intraparenchymal cyst. No peripancreatic edema. Spleen: No splenomegaly. No suspicious focal mass lesion. Adrenals/Urinary Tract: No adrenal nodule or mass. Scattered punctate calcification noted in the parenchyma of the right kidney. Cortical and central sinus cysts again noted left kidney. No evidence for hydroureter. The urinary bladder appears normal for the degree of distention. Stomach/Bowel: Stomach is decompressed spine in G-tube. NG tube tip is in the distal stomach. Duodenum is normally positioned as is the ligament of Treitz. Proximal jejunal loops are nondilated. Small bowel loops remain dilated in the abdomen and pelvis measuring up to 5.4 cm diameter. Proximal and distal small bowel is un opacified with some dilute contrast identified in central dilated small bowel of the abdomen and pelvis which includes the small bowel anastomosis. There is an abrupt transition zone of dilated opacified small bowel in the right pelvis with a classic beak appearance (see axial 99/2). Immediately adjacent to this is a abrupt transition of un opacified small bowel immediately posterior to the transition in the opacified bowel (also image 99/2). A third area of transition is identified in the central pelvis involving another opacified small bowel loop adjacent to the anastomosis (best seen on coronal 44/4. Multiple adhesions and apparent mesenteric tethering or seen in the central pelvis involving these areas of transition. In area of apparent mesenteric compression is identified in the right pelvis on image 93/2 raising the question of internal hernia. There are completely decompressed small bowel loops in the right lower quadrant there appears to be a staple line in the right colon although ileocecal anatomy is not well demonstrated on this study. Colon is diffusely decompressed. No definite contrast material within the colonic lumen. Vascular/Lymphatic: There is mild atherosclerotic calcification of the  abdominal aorta without aneurysm. There is no gastrohepatic or hepatoduodenal ligament lymphadenopathy. No retroperitoneal or mesenteric lymphadenopathy. No pelvic sidewall lymphadenopathy. Reproductive: There is no adnexal mass. Other: Scattered areas of interloop mesenteric fluid noted with small volume free fluid identified in the pelvis. Musculoskeletal: No worrisome lytic or sclerotic osseous abnormality. IMPRESSION: 1. Small bowel loops remain dilated in the abdomen and pelvis measuring up to 5.4 cm diameter. There is some diluted contrast in the dilated small bowel loops, but no discernible contrast in the colonic lumen. The degree of small-bowel dilatation appears stable to mildly progressive in the interval. There there are multiple relatively abrupt transition zones from dilated to nondilated small bowel in the central pelvis. Multiple adhesions and apparent mesenteric tethering or seen in the central pelvis involving these areas of transition. Appearance suggest mechanical obstruction due to multiple adhesions although an area of apparent mesenteric compression is identified in the right pelvis raising the question of internal  hernia. Given the proximal jejunum and distal small bowel in the right lower quadrant are decompressed, closed loop obstruction also a consideration. No small bowel wall thickening. No evidence for pneumatosis. Multiple areas of interloop mesenteric fluid evident. 2. Small bilateral pleural effusions with dependent collapse/consolidation in the right lower lobe. 3. Small airway impaction with tree-in-bud nodularity and patchy airspace disease in the posterior left upper lobe/lingula and dependent left lower lobe. Imaging features are compatible with an infectious/inflammatory etiology. 4. Bronchiectasis with volume loss and consolidative opacity in the right middle lobe is progressive in the long interval since the prior study. This probably reflects chronic atelectasis/scarring. 5.  7 mm posterior right upper lobe pulmonary nodule is new in the long interval since previous chest CT. Non-contrast chest CT at 6-12 months is recommended. If the nodule is stable at time of repeat CT, then future CT at 18-24 months (from today's scan) is considered optional for low-risk patients, but is recommended for high-risk patients. This recommendation follows the consensus statement: Guidelines for Management of Incidental Pulmonary Nodules Detected on CT Images: From the Fleischner Society 2017; Radiology 2017; 284:228-243. 6. Mild circumferential wall thickening in the distal esophagus just proximal to a moderate hiatal hernia. Esophagitis would be a consideration. 7.  Aortic Atherosclerosis (ICD10-I70.0). Electronically Signed   By: Camellia Candle M.D.   On: 10/05/2023 05:56   ECHOCARDIOGRAM COMPLETE Result Date: 10/04/2023    ECHOCARDIOGRAM REPORT   Patient Name:   Tracey [name] Date of Exam: 10/04/2023 Medical Rec #:  [id number]       Height:       62.0 in Accession #:    [id number]      Weight:       110.0 lb Date of Birth:  08/21/1940       BSA:          1.483 m Patient Age:    82 years        BP:           93/79 mmHg Patient Gender: F               HR:           112 bpm. Exam Location:  Inpatient Procedure: 2D Echo (Both Spectral and Color Flow Doppler were utilized during            procedure). Indications:    Afib  History:        Patient has no prior history of Echocardiogram examinations.                 Signs/Symptoms:Dyspnea.  Sonographer:    Norleen Amour Referring Phys: [id number] SYLVESTER I OGBATA IMPRESSIONS  1. Left ventricular ejection fraction, by estimation, is 60 to 65%. Left ventricular ejection fraction by 2D MOD biplane is 60.0 %. The left ventricle has normal function. The left ventricle has no regional wall motion abnormalities. There is mild left ventricular hypertrophy. Left ventricular diastolic function could not be evaluated.  2. Right ventricular systolic function is normal. The  right ventricular size is normal. There is normal pulmonary artery systolic pressure. The estimated right ventricular systolic pressure is 26.6 mmHg.  3. The mitral valve is grossly normal. No evidence of mitral valve regurgitation.  4. The aortic valve is tricuspid. Aortic valve regurgitation is not visualized. Aortic valve sclerosis is present, with no evidence of aortic valve stenosis.  5. The inferior vena cava is normal in size with greater than 50% respiratory variability, suggesting right atrial  pressure of 3 mmHg. Comparison(s): No prior Echocardiogram. FINDINGS  Left Ventricle: Left ventricular ejection fraction, by estimation, is 60 to 65%. Left ventricular ejection fraction by 2D MOD biplane is 60.0 %. The left ventricle has normal function. The left ventricle has no regional wall motion abnormalities. The left ventricular internal cavity size was normal in size. There is mild left ventricular hypertrophy. Left ventricular diastolic function could not be evaluated due to atrial fibrillation. Left ventricular diastolic function could not be evaluated. Right Ventricle: The right ventricular size is normal. No increase in right ventricular wall thickness. Right ventricular systolic function is normal. There is normal pulmonary artery systolic pressure. The tricuspid regurgitant velocity is 2.43 m/s, and  with an assumed right atrial pressure of 3 mmHg, the estimated right ventricular systolic pressure is 26.6 mmHg. Left Atrium: Left atrial size was normal in size. Right Atrium: Right atrial size was normal in size. Pericardium: There is no evidence of pericardial effusion. Mitral Valve: The mitral valve is grossly normal. No evidence of mitral valve regurgitation. Tricuspid Valve: The tricuspid valve is grossly normal. Tricuspid valve regurgitation is mild. Aortic Valve: The aortic valve is tricuspid. Aortic valve regurgitation is not visualized. Aortic valve sclerosis is present, with no evidence of aortic  valve stenosis. Pulmonic Valve: The pulmonic valve was normal in structure. Pulmonic valve regurgitation is not visualized. Aorta: The aortic root and ascending aorta are structurally normal, with no evidence of dilitation. Venous: The inferior vena cava is normal in size with greater than 50% respiratory variability, suggesting right atrial pressure of 3 mmHg. IAS/Shunts: The interatrial septum was not well visualized.  LEFT VENTRICLE PLAX 2D                        Biplane EF (MOD) LV PW:         1.05 cm         LV Biplane EF:   Left LV IVS:        1.05 cm                          ventricular LVOT diam:     1.70 cm                          ejection LV SV:         29                               fraction by LV SV Index:   19                               2D MOD LVOT Area:     2.27 cm                         biplane is                                                 60.0 %.  LV Volumes (MOD)               Diastology LV vol d, MOD    23.2 ml  LV e' medial:    7.51 cm/s A2C:                           LV E/e' medial:  11.7 LV vol d, MOD    44.2 ml       LV e' lateral:   8.23 cm/s A4C:                           LV E/e' lateral: 10.7 LV vol s, MOD    12.8 ml A2C: LV vol s, MOD    12.4 ml A4C: LV SV MOD A2C:   10.4 ml LV SV MOD A4C:   44.2 ml LV SV MOD BP:    19.5 ml RIGHT VENTRICLE             IVC RV Basal diam:  2.50 cm     IVC diam: 0.90 cm RV S prime:     16.13 cm/s TAPSE (M-mode): 1.4 cm LEFT ATRIUM             Index        RIGHT ATRIUM           Index LA Vol (A2C):   45.1 ml 30.42 ml/m  RA Area:     11.30 cm LA Vol (A4C):   29.1 ml 19.62 ml/m  RA Volume:   24.50 ml  16.52 ml/m LA Biplane Vol: 36.8 ml 24.82 ml/m  AORTIC VALVE LVOT Vmax:   91.53 cm/s LVOT Vmean:  67.300 cm/s LVOT VTI:    0.127 m  AORTA Ao Root diam: 2.90 cm Ao Asc diam:  3.00 cm MV E velocity: 87.70 cm/s  TRICUSPID VALVE                            TR Peak grad:   23.6 mmHg                            TR Vmax:        243.00 cm/s                              SHUNTS                            Systemic VTI:  0.13 m                            Systemic Diam: 1.70 cm Vinie Maxcy MD Electronically signed by Vinie Maxcy MD Signature Date/Time: 10/04/2023/3:54:43 PM    Final    DG CHEST PORT 1 VIEW Result Date: 10/04/2023 CLINICAL DATA:  NG tube placement. EXAM: PORTABLE CHEST 1 VIEW COMPARISON:  Chest x-ray and KUB 10/04/2023 FINDINGS: Patient is slightly rotated to the right. Interval placement of nasogastric tube with tip and side-port over the stomach in the left upper quadrant. Lungs are adequately inflated with stable minimal hazy bibasilar opacification likely atelectasis versus edema and less likely infection. Cardiomediastinal silhouette is normal. Dilated air-filled small bowel loop over the upper abdomen unchanged. Remainder of the exam is unchanged. IMPRESSION: 1. Interval placement of nasogastric tube with tip and side-port over the stomach in the left upper quadrant. 2. Stable minimal hazy bibasilar opacification  likely atelectasis versus edema and less likely infection. Electronically Signed   By: Toribio Agreste M.D.   On: 10/04/2023 12:20   DG Chest Port 1 View Result Date: 10/04/2023 CLINICAL DATA:  NG tube placement. EXAM: PORTABLE CHEST 1 VIEW COMPARISON:  08/06/2023 FINDINGS: The NG tube is looped back on itself in the hypopharynx and does not extend down the esophagus. No pulmonary edema or focal airspace consolidation. Minimal basilar atelectasis with no substantial pleural effusion. Cardiopericardial silhouette is at upper limits of normal for size. Telemetry leads overlie the chest. IMPRESSION: NG tube is looped back on itself in the hypopharynx and does not extend down the esophagus. Tube should be removed and replaced. Repeat imaging after repositioning recommended. These results will be called to the ordering clinician or representative by the Radiologist Assistant, and communication documented in the PACS or [employer].  Electronically Signed   By: Camellia Candle M.D.   On: 10/04/2023 11:18   DG Abd Portable 1V Result Date: 10/04/2023 CLINICAL DATA:  Small bowel obstruction. EXAM: PORTABLE ABDOMEN - 1 VIEW COMPARISON:  10/03/2023 FINDINGS: NG tube tip is positioned in the distal stomach. Diffuse gaseous small bowel dilatation persists without substantial change. Small bowel loops are dilated up to 5.9 cm in the mid abdomen. IMPRESSION: Persistent diffuse gaseous small bowel dilatation compatible with small bowel obstruction. Electronically Signed   By: Camellia Candle M.D.   On: 10/04/2023 08:06    Review of Systems  Unable to perform ROS: Acuity of condition   Blood pressure 104/69, pulse 80, temperature (!) 97.1 F (36.2 C), temperature source Oral, resp. rate (!) 25, height 5' 2 (1.575 m), weight 49.9 kg, SpO2 98%. Physical Exam HENT:     Head: Normocephalic and atraumatic.  Eyes:     Conjunctiva/sclera: Conjunctivae normal.  Neck:     Vascular: No carotid bruit.  Cardiovascular:     Rate and Rhythm: Rhythm irregular.     Heart sounds: Murmur (2/6 systolic murmur noted) heard.  Pulmonary:     Comments: Decreased breath sounds at bases Abdominal:     General: Bowel sounds are normal. There is distension.     Palpations: Abdomen is soft.     Tenderness: There is no abdominal tenderness. There is no guarding.  Musculoskeletal:        General: No swelling, tenderness or deformity.     Cervical back: Neck supple. No tenderness.  Skin:    General: Skin is warm and dry.  Neurological:     General: No focal deficit present.     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

## 2023-10-05 NOTE — Progress Notes (Signed)
 PROGRESS NOTE    Tracey [name]  FMW:[id number] DOB: [birth year] DOA: 10/01/2023 PCP: Mast, Man X, NP  Outpatient Specialists:     Brief Narrative:  As per H&P done on admission: Tracey [name] is a 83 y.o. female with medical history significant for cognitive impairment, bipolar disorder, possible Parkinsons disease and h/o rection of GIST tumor presents with complaints of abdominal pain nausea vomiting and a lot of abdominal distention.  The symptoms have been going on for 2 to 3 days.  They were consistent with constipation but the patient took multiple laxatives which did not improve the situation. In the emergency department the patient had a CT scan of her abd/ pelvis revealed high-grade small bowel obstruction with transition point in the pelvis to the right of midline.  Mild mesenteric edema and small to moderate free fluid reported.   General surgery was called by the ED provider who recommended that the hospitalist admit her as she is on multiple medications.   10/03/2023: Patient seen.  No significant history from patient.  No new complaints.  No bowel movement of flatus.  NG tube to low intermittent wall suction.  General surgery team input is appreciated.  Patient is currently being worked closely.     10/04/2023: Patient went into atrial fibrillation RVR, with heart rate of 146 bpm.  Systolic blood pressure also dropped from 146 to the 90s.  Patient will be transferred to stepdown unit.  Discussed with cardiology team, Dr. Inocencio, patient will be started on amiodarone  and heparin  drip.  Patient's son updated.  Communicated above to the surgical team, Dr. Bernarda Ned.  Check lactic acid level, procalcitonin, cycle cardiac enzymes, cardiac BNP, and have a low threshold to proceed with CT chest and possible antibiotics.  Patient is a high risk patient for surgery, and surgery is a high risk surgery.  Surgery team prefers conservative management of SBO (small bowel obstruction) for  now.  10/05/2023: Patient seen alongside patient's son and nurse.  Patient looks better today, however, no bowel activity.  Heart rate is better controlled, but patient is still in A-fib.  Systolic blood pressure is better as well.  Surgery input is appreciated.  Personally discussed A-fib with RVR with the cardiology team, Dr. Levern.  Dr. Levern will see patient in consultation.    Assessment & Plan:   Principal Problem:   SBO (small bowel obstruction) (HCC) Active Problems:   Difficulty hearing   Small bowel obstruction: - See above documentation. - NG tube to low intermittent wall suction. - Supportive care. - Adequate hydration. 10/05/2023: See above documentation.  NG to low intermittent wall suction.  Dementia: - No behavioral problems.  Volume depletion: - Optimize volume resuscitation.  Hypertension: - See above documentation.  Hypophosphatemia: - IV K-Phos.  Atrial fibrillation with RVR: - Heart rate is better controlled. - Blood pressure is better (low blood pressure is improving).  Guarded prognosis.   DVT prophylaxis: Start subcutaneous Lovenox  Code Status: DNR Family Communication:  Disposition Plan: This will depend on hospital course   Consultants:  Neurosurgery  Procedures:  NG tube to low intermittent wall suction  Antimicrobials:  None   Subjective: A-fib RVR. No new complaints Looks better  Objective: Vitals:   10/05/23 1400 10/05/23 1500 10/05/23 1600 10/05/23 1726  BP: 123/82 116/83 97/64   Pulse: 91 83 (!) 104   Resp: (!) 31 (!) 26 (!) 21   Temp:    97.9 F (36.6 C)  TempSrc:    Oral  SpO2: 97% 97% 98%   Weight:      Height:        Intake/Output Summary (Last 24 hours) at 10/05/2023 1757 Last data filed at 10/05/2023 1449 Gross per 24 hour  Intake 2473.43 ml  Output 3800 ml  Net -1326.57 ml   Filed Weights   10/02/23 1456  Weight: 49.9 kg    Examination:  General exam: Appears calm and comfortable   Respiratory system: Clear to auscultation.  Cardiovascular system: S1 & S2, irregularly irregular.  Gastrointestinal system: Abdomen is nondistended Central nervous system: Awake and alert.    Data Reviewed: I have personally reviewed following labs and imaging studies  CBC: Recent Labs  Lab 10/01/23 1922 10/02/23 0546 10/03/23 0443 10/04/23 0540 10/05/23 0309  WBC 3.4* 2.6* 6.4 6.1 5.4  NEUTROABS 2.4  --  4.8 4.0 3.0  HGB 13.2 12.7 12.4 12.5 11.9*  HCT 41.4 39.7 39.6 42.1 39.4  MCV 101.0* 99.7 101.0* 104.7* 103.4*  PLT 336 402* 308 310 314   Basic Metabolic Panel: Recent Labs  Lab 10/01/23 1922 10/02/23 0546 10/03/23 0443 10/04/23 0540 10/05/23 0309  NA 136 137 140 145 147*  K 4.4 4.1 3.6 3.6 3.7  CL 99 99 98 104 110  CO2 21* 23 26 26 22   GLUCOSE 150* 153* 133* 95 106*  BUN 36* 39* 52* 39* 28*  CREATININE 1.06* 0.95 1.25* 1.06* 0.86  CALCIUM 11.0* 10.6* 10.6* 10.5* 9.5  MG  --  2.1 2.6* 2.5* 2.6*  PHOS  --   --  2.8 1.8* 2.2*   GFR: Estimated Creatinine Clearance: 39.7 mL/min (by C-G formula based on SCr of 0.86 mg/dL). Liver Function Tests: Recent Labs  Lab 10/01/23 1922 10/03/23 0443 10/04/23 0540 10/05/23 0309  AST 22 18  --   --   ALT 10 7  --   --   ALKPHOS 82 65  --   --   BILITOT 1.1 0.6  --   --   PROT 7.2 6.5  --   --   ALBUMIN  4.4 3.7 3.6 3.1*   No results for input(s): LIPASE, AMYLASE in the last 168 hours. No results for input(s): AMMONIA in the last 168 hours. Coagulation Profile: No results for input(s): INR, PROTIME in the last 168 hours. Cardiac Enzymes: No results for input(s): CKTOTAL, CKMB, CKMBINDEX, TROPONINI in the last 168 hours. BNP (last 3 results) Recent Labs    10/04/23 1310  PROBNP 529.0*   HbA1C: No results for input(s): HGBA1C in the last 72 hours. CBG: No results for input(s): GLUCAP in the last 168 hours. Lipid Profile: No results for input(s): CHOL, HDL, LDLCALC, TRIG,  CHOLHDL, LDLDIRECT in the last 72 hours. Thyroid  Function Tests: Recent Labs    10/05/23 1457  TSH 1.150   Anemia Panel: No results for input(s): VITAMINB12, FOLATE, FERRITIN, TIBC, IRON , RETICCTPCT in the last 72 hours. Urine analysis:    Component Value Date/Time   COLORURINE STRAW (A) 01/12/2020 2103   APPEARANCEUR CLEAR 01/12/2020 2103   LABSPEC 1.012 01/12/2020 2103   PHURINE 6.0 01/12/2020 2103   GLUCOSEU NEGATIVE 01/12/2020 2103   HGBUR NEGATIVE 01/12/2020 2103   BILIRUBINUR NEGATIVE 01/12/2020 2103   KETONESUR NEGATIVE 01/12/2020 2103   PROTEINUR NEGATIVE 01/12/2020 2103   NITRITE NEGATIVE 01/12/2020 2103   LEUKOCYTESUR NEGATIVE 01/12/2020 2103   Sepsis Labs: @LABRCNTIP (procalcitonin:4,lacticidven:4)  ) Recent Results (from the past 240 hours)  Culture, blood (Routine X 2) w Reflex to ID Panel  Status: None (Preliminary result)   Collection Time: 10/04/23  1:10 PM   Specimen: BLOOD LEFT ARM  Result Value Ref Range Status   Specimen Description   Final    BLOOD LEFT ARM Performed at [facility], [address], [place], [place] [address]    Special Requests   Final    BOTTLES DRAWN AEROBIC ONLY Blood Culture results may not be optimal due to an inadequate volume of blood received in culture bottles Performed at [facility], [address], [place], [place] [address]    Culture   Final    NO GROWTH < 24 HOURS Performed at [facility], [address] [place], [place] [address]    Report Status PENDING  Incomplete  Culture, blood (Routine X 2) w Reflex to ID Panel     Status: None (Preliminary result)   Collection Time: 10/04/23  1:10 PM   Specimen: BLOOD LEFT HAND  Result Value Ref Range Status   Specimen Description   Final    BLOOD LEFT HAND Performed at [facility], [address], [place], [place] [address]    Special Requests   Final    BOTTLES DRAWN AEROBIC AND ANAEROBIC Blood Culture  results may not be optimal due to an inadequate volume of blood received in culture bottles Performed at [facility], [address], [place], [place] [address]    Culture   Final    NO GROWTH < 24 HOURS Performed at [facility], [address] [place], [place] [address]    Report Status PENDING  Incomplete  MRSA Next Gen by PCR, Nasal     Status: None   Collection Time: 10/04/23  2:48 PM   Specimen: Nasal Mucosa; Nasal Swab  Result Value Ref Range Status   MRSA by PCR Next Gen NOT DETECTED NOT DETECTED Final    Comment: (NOTE) The GeneXpert MRSA Assay (FDA approved for NASAL specimens only), is one component of a comprehensive MRSA colonization surveillance program. It is not intended to diagnose MRSA infection nor to guide or monitor treatment for MRSA infections. Test performance is not FDA approved in patients less than 40 years old. Performed at [facility], [address], [place], [place] [address]          Radiology Studies: CT CHEST ABDOMEN PELVIS WO CONTRAST Result Date: 10/05/2023 CLINICAL DATA:  Respiratory illness. Bowel obstruction. 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 EXAM: CT CHEST, ABDOMEN AND PELVIS WITHOUT CONTRAST TECHNIQUE: Multidetector CT imaging of the chest, abdomen and pelvis was performed following the standard protocol without IV contrast. RADIATION DOSE REDUCTION: This exam was performed according to the departmental dose-optimization program which includes automated exposure control, adjustment of the mA and/or kV according to patient size and/or use of iterative reconstruction technique. COMPARISON:  Abdomen pelvis CT 10/01/2023.  Chest CT 04/24/2012 FINDINGS: CT CHEST FINDINGS Cardiovascular: The heart size is normal. No substantial pericardial effusion. Mild atherosclerotic calcification is noted in the wall of the thoracic aorta. Mediastinum/Nodes: No  mediastinal lymphadenopathy. NG tube visualized in the esophagus. Mild circumferential wall thickening noted distal esophagus just proximal to a moderate hiatal hernia. No evidence for gross hilar lymphadenopathy although assessment is limited by the lack of intravenous contrast on the current study. The esophagus has normal imaging features. Lungs/Pleura: 7 mm posterior right upper lobe nodule on 51/6 is new in the long interval since previous  chest CT. Small airway impaction with tree-in-bud nodularity and patchy airspace disease noted in the posterior left upper lobe/lingula and dependent left lower lobe. Bronchiectasis with volume loss and consolidative opacity in the right middle lobe is progressive in the long interval since the prior study. There is some airway impaction and subtle clustered nodularity in the inferior right upper lobe. Patchy ill-defined nodular airspace opacity in the right lower lobe is associated with dependent collapse/consolidation. Small bilateral pleural effusions. Musculoskeletal: No worrisome lytic or sclerotic osseous abnormality. CT ABDOMEN PELVIS FINDINGS Hepatobiliary: Scattered tiny hypodensities in the liver parenchyma are too small to characterize but are statistically most likely benign. No followup imaging is recommended. High attenuation material in the lumen of the gallbladder is compatible with vicarious excretion of intravenous contrast material. No intrahepatic or extrahepatic biliary dilation. Pancreas: No focal mass lesion. No dilatation of the main duct. No intraparenchymal cyst. No peripancreatic edema. Spleen: No splenomegaly. No suspicious focal mass lesion. Adrenals/Urinary Tract: No adrenal nodule or mass. Scattered punctate calcification noted in the parenchyma of the right kidney. Cortical and central sinus cysts again noted left kidney. No evidence for hydroureter. The urinary bladder appears normal for the degree of distention. Stomach/Bowel: Stomach is  decompressed spine in G-tube. NG tube tip is in the distal stomach. Duodenum is normally positioned as is the ligament of Treitz. Proximal jejunal loops are nondilated. Small bowel loops remain dilated in the abdomen and pelvis measuring up to 5.4 cm diameter. Proximal and distal small bowel is un opacified with some dilute contrast identified in central dilated small bowel of the abdomen and pelvis which includes the small bowel anastomosis. There is an abrupt transition zone of dilated opacified small bowel in the right pelvis with a classic beak appearance (see axial 99/2). Immediately adjacent to this is a abrupt transition of un opacified small bowel immediately posterior to the transition in the opacified bowel (also image 99/2). A third area of transition is identified in the central pelvis involving another opacified small bowel loop adjacent to the anastomosis (best seen on coronal 44/4. Multiple adhesions and apparent mesenteric tethering or seen in the central pelvis involving these areas of transition. In area of apparent mesenteric compression is identified in the right pelvis on image 93/2 raising the question of internal hernia. There are completely decompressed small bowel loops in the right lower quadrant there appears to be a staple line in the right colon although ileocecal anatomy is not well demonstrated on this study. Colon is diffusely decompressed. No definite contrast material within the colonic lumen. Vascular/Lymphatic: There is mild atherosclerotic calcification of the abdominal aorta without aneurysm. There is no gastrohepatic or hepatoduodenal ligament lymphadenopathy. No retroperitoneal or mesenteric lymphadenopathy. No pelvic sidewall lymphadenopathy. Reproductive: There is no adnexal mass. Other: Scattered areas of interloop mesenteric fluid noted with small volume free fluid identified in the pelvis. Musculoskeletal: No worrisome lytic or sclerotic osseous abnormality. IMPRESSION:  1. Small bowel loops remain dilated in the abdomen and pelvis measuring up to 5.4 cm diameter. There is some diluted contrast in the dilated small bowel loops, but no discernible contrast in the colonic lumen. The degree of small-bowel dilatation appears stable to mildly progressive in the interval. There there are multiple relatively abrupt transition zones from dilated to nondilated small bowel in the central pelvis. Multiple adhesions and apparent mesenteric tethering or seen in the central pelvis involving these areas of transition. Appearance suggest mechanical obstruction due to multiple adhesions although an area of apparent mesenteric compression  is identified in the right pelvis raising the question of internal hernia. Given the proximal jejunum and distal small bowel in the right lower quadrant are decompressed, closed loop obstruction also a consideration. No small bowel wall thickening. No evidence for pneumatosis. Multiple areas of interloop mesenteric fluid evident. 2. Small bilateral pleural effusions with dependent collapse/consolidation in the right lower lobe. 3. Small airway impaction with tree-in-bud nodularity and patchy airspace disease in the posterior left upper lobe/lingula and dependent left lower lobe. Imaging features are compatible with an infectious/inflammatory etiology. 4. Bronchiectasis with volume loss and consolidative opacity in the right middle lobe is progressive in the long interval since the prior study. This probably reflects chronic atelectasis/scarring. 5. 7 mm posterior right upper lobe pulmonary nodule is new in the long interval since previous chest CT. Non-contrast chest CT at 6-12 months is recommended. If the nodule is stable at time of repeat CT, then future CT at 18-24 months (from today's scan) is considered optional for low-risk patients, but is recommended for high-risk patients. This recommendation follows the consensus statement: Guidelines for Management of  Incidental Pulmonary Nodules Detected on CT Images: From the Fleischner Society 2017; Radiology 2017; 284:228-243. 6. Mild circumferential wall thickening in the distal esophagus just proximal to a moderate hiatal hernia. Esophagitis would be a consideration. 7.  Aortic Atherosclerosis (ICD10-I70.0). Electronically Signed   By: Camellia Candle M.D.   On: 10/05/2023 05:56   ECHOCARDIOGRAM COMPLETE Result Date: 10/04/2023    ECHOCARDIOGRAM REPORT   Patient Name:   Tracey [name] Date of Exam: 10/04/2023 Medical Rec #:  [id number]       Height:       62.0 in Accession #:    [id number]      Weight:       110.0 lb Date of Birth:  03-Jun-1940       BSA:          1.483 m Patient Age:    82 years        BP:           93/79 mmHg Patient Gender: F               HR:           112 bpm. Exam Location:  Inpatient Procedure: 2D Echo (Both Spectral and Color Flow Doppler were utilized during            procedure). Indications:    Afib  History:        Patient has no prior history of Echocardiogram examinations.                 Signs/Symptoms:Dyspnea.  Sonographer:    Norleen Amour Referring Phys: 54 Candia Kingsbury I Ireta Pullman IMPRESSIONS  1. Left ventricular ejection fraction, by estimation, is 60 to 65%. Left ventricular ejection fraction by 2D MOD biplane is 60.0 %. The left ventricle has normal function. The left ventricle has no regional wall motion abnormalities. There is mild left ventricular hypertrophy. Left ventricular diastolic function could not be evaluated.  2. Right ventricular systolic function is normal. The right ventricular size is normal. There is normal pulmonary artery systolic pressure. The estimated right ventricular systolic pressure is 26.6 mmHg.  3. The mitral valve is grossly normal. No evidence of mitral valve regurgitation.  4. The aortic valve is tricuspid. Aortic valve regurgitation is not visualized. Aortic valve sclerosis is present, with no evidence of aortic valve stenosis.  5. The inferior vena cava is  normal  in size with greater than 50% respiratory variability, suggesting right atrial pressure of 3 mmHg. Comparison(s): No prior Echocardiogram. FINDINGS  Left Ventricle: Left ventricular ejection fraction, by estimation, is 60 to 65%. Left ventricular ejection fraction by 2D MOD biplane is 60.0 %. The left ventricle has normal function. The left ventricle has no regional wall motion abnormalities. The left ventricular internal cavity size was normal in size. There is mild left ventricular hypertrophy. Left ventricular diastolic function could not be evaluated due to atrial fibrillation. Left ventricular diastolic function could not be evaluated. Right Ventricle: The right ventricular size is normal. No increase in right ventricular wall thickness. Right ventricular systolic function is normal. There is normal pulmonary artery systolic pressure. The tricuspid regurgitant velocity is 2.43 m/s, and  with an assumed right atrial pressure of 3 mmHg, the estimated right ventricular systolic pressure is 26.6 mmHg. Left Atrium: Left atrial size was normal in size. Right Atrium: Right atrial size was normal in size. Pericardium: There is no evidence of pericardial effusion. Mitral Valve: The mitral valve is grossly normal. No evidence of mitral valve regurgitation. Tricuspid Valve: The tricuspid valve is grossly normal. Tricuspid valve regurgitation is mild. Aortic Valve: The aortic valve is tricuspid. Aortic valve regurgitation is not visualized. Aortic valve sclerosis is present, with no evidence of aortic valve stenosis. Pulmonic Valve: The pulmonic valve was normal in structure. Pulmonic valve regurgitation is not visualized. Aorta: The aortic root and ascending aorta are structurally normal, with no evidence of dilitation. Venous: The inferior vena cava is normal in size with greater than 50% respiratory variability, suggesting right atrial pressure of 3 mmHg. IAS/Shunts: The interatrial septum was not well  visualized.  LEFT VENTRICLE PLAX 2D                        Biplane EF (MOD) LV PW:         1.05 cm         LV Biplane EF:   Left LV IVS:        1.05 cm                          ventricular LVOT diam:     1.70 cm                          ejection LV SV:         29                               fraction by LV SV Index:   19                               2D MOD LVOT Area:     2.27 cm                         biplane is                                                 60.0 %.  LV Volumes (MOD)  Diastology LV vol d, MOD    23.2 ml       LV e' medial:    7.51 cm/s A2C:                           LV E/e' medial:  11.7 LV vol d, MOD    44.2 ml       LV e' lateral:   8.23 cm/s A4C:                           LV E/e' lateral: 10.7 LV vol s, MOD    12.8 ml A2C: LV vol s, MOD    12.4 ml A4C: LV SV MOD A2C:   10.4 ml LV SV MOD A4C:   44.2 ml LV SV MOD BP:    19.5 ml RIGHT VENTRICLE             IVC RV Basal diam:  2.50 cm     IVC diam: 0.90 cm RV S prime:     16.13 cm/s TAPSE (M-mode): 1.4 cm LEFT ATRIUM             Index        RIGHT ATRIUM           Index LA Vol (A2C):   45.1 ml 30.42 ml/m  RA Area:     11.30 cm LA Vol (A4C):   29.1 ml 19.62 ml/m  RA Volume:   24.50 ml  16.52 ml/m LA Biplane Vol: 36.8 ml 24.82 ml/m  AORTIC VALVE LVOT Vmax:   91.53 cm/s LVOT Vmean:  67.300 cm/s LVOT VTI:    0.127 m  AORTA Ao Root diam: 2.90 cm Ao Asc diam:  3.00 cm MV E velocity: 87.70 cm/s  TRICUSPID VALVE                            TR Peak grad:   23.6 mmHg                            TR Vmax:        243.00 cm/s                             SHUNTS                            Systemic VTI:  0.13 m                            Systemic Diam: 1.70 cm Vinie Maxcy MD Electronically signed by Vinie Maxcy MD Signature Date/Time: 10/04/2023/3:54:43 PM    Final    DG CHEST PORT 1 VIEW Result Date: 10/04/2023 CLINICAL DATA:  NG tube placement. EXAM: PORTABLE CHEST 1 VIEW COMPARISON:  Chest x-ray and KUB 10/04/2023 FINDINGS: Patient is  slightly rotated to the right. Interval placement of nasogastric tube with tip and side-port over the stomach in the left upper quadrant. Lungs are adequately inflated with stable minimal hazy bibasilar opacification likely atelectasis versus edema and less likely infection. Cardiomediastinal silhouette is normal. Dilated air-filled small bowel loop over the upper abdomen unchanged. Remainder of the exam is unchanged. IMPRESSION: 1. Interval placement of nasogastric tube with tip  and side-port over the stomach in the left upper quadrant. 2. Stable minimal hazy bibasilar opacification likely atelectasis versus edema and less likely infection. Electronically Signed   By: Toribio Agreste M.D.   On: 10/04/2023 12:20   DG Chest Port 1 View Result Date: 10/04/2023 CLINICAL DATA:  NG tube placement. EXAM: PORTABLE CHEST 1 VIEW COMPARISON:  08/06/2023 FINDINGS: The NG tube is looped back on itself in the hypopharynx and does not extend down the esophagus. No pulmonary edema or focal airspace consolidation. Minimal basilar atelectasis with no substantial pleural effusion. Cardiopericardial silhouette is at upper limits of normal for size. Telemetry leads overlie the chest. IMPRESSION: NG tube is looped back on itself in the hypopharynx and does not extend down the esophagus. Tube should be removed and replaced. Repeat imaging after repositioning recommended. These results will be called to the ordering clinician or representative by the Radiologist Assistant, and communication documented in the PACS or [employer]. Electronically Signed   By: Camellia Candle M.D.   On: 10/04/2023 11:18   DG Abd Portable 1V Result Date: 10/04/2023 CLINICAL DATA:  Small bowel obstruction. EXAM: PORTABLE ABDOMEN - 1 VIEW COMPARISON:  10/03/2023 FINDINGS: NG tube tip is positioned in the distal stomach. Diffuse gaseous small bowel dilatation persists without substantial change. Small bowel loops are dilated up to 5.9 cm in the mid  abdomen. IMPRESSION: Persistent diffuse gaseous small bowel dilatation compatible with small bowel obstruction. Electronically Signed   By: Camellia Candle M.D.   On: 10/04/2023 08:06        Scheduled Meds:  Chlorhexidine  Gluconate Cloth  6 each Topical Daily   cycloSPORINE   1 drop Both Eyes BID   latanoprost   1 drop Both Eyes QHS   lithium  carbonate  150 mg Per Tube Q2000   timolol   1 drop Both Eyes Daily   topiramate   25 mg Oral BID   Continuous Infusions:  amiodarone  30 mg/hr (10/05/23 1407)   dextrose  5 % and 0.9 % NaCl with KCl 20 mEq/L 75 mL/hr at 10/05/23 1407   heparin  750 Units/hr (10/05/23 1410)   potassium PHOSPHATE  IVPB (in mmol) 43 mL/hr at 10/05/23 1407     LOS: 4 days    Time spent: 55 minutes    Leatrice Chapel, MD  [facility] Hospitalists Pager #: [phone] 7PM-7AM contact night coverage as above

## 2023-10-05 NOTE — Progress Notes (Signed)
 PHARMACY - ANTICOAGULATION CONSULT NOTE  Pharmacy Consult for heparin   Indication: atrial fibrillation  Allergies  Allergen Reactions   Lisinopril Cough   Penicillins Itching and Other (See Comments)    50 years ago   Adhesive [Tape] Itching   Atorvastatin Itching   Dilaudid  [Hydromorphone  Hcl] Itching    Patient Measurements: Height: 5' 2 (157.5 cm) Weight: 49.9 kg (110 lb 0.2 oz) IBW/kg (Calculated) : 50.1 HEPARIN  DW (KG): 49.9  Vital Signs: Temp: 98.3 F (36.8 C) (09/21 0826) Temp Source: Oral (09/21 0826) BP: 114/80 (09/21 0800) Pulse Rate: 88 (09/21 0813)  Labs: Recent Labs    10/03/23 0443 10/04/23 0540 10/04/23 2335 10/05/23 0309 10/05/23 0816  HGB 12.4 12.5  --  11.9*  --   HCT 39.6 42.1  --  39.4  --   PLT 308 310  --  314  --   HEPARINUNFRC  --   --  0.40  --  0.33  CREATININE 1.25* 1.06*  --  0.86  --     Estimated Creatinine Clearance: 39.7 mL/min (by C-G formula based on SCr of 0.86 mg/dL).   Medical History: 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, possible    Tremors possible parkinsons   Manic disorder, single episode, in full remission (HCC) 12/01/2009   Sixth nerve palsy     Medications:  No prior to admission anticoagulation meds listed  Assessment: Pharmacy consulted to dose heparin  in 83 yo F admitted with a high grade pSBO.  Heparin  to begin for new Afib with RVR. Initial CBC WNL,  SCr 1.06  Today, 10/05/23 08:16 confirmatory heparin  level = 0.33, therapeutic, with IV heparin  infusing at 750 units/hr CBC: hemoglobin down slightly, platelets WNL No bleeding or other complications of therapy per nurse  Goal of Therapy:  Heparin  level 0.3-0.7 units/ml Monitor platelets by anticoagulation protocol: Yes   Plan:  Continue Heparin  drip at 750 units/hr Monitor daily heparin  level, CBC, signs/symptoms of bleeding   Thank you for allowing pharmacy to be a part of this patient's care.  Eleanor EMERSON Agent, PharmD, BCPS Clinical Pharmacist [place] 10/05/2023 10:27 AM

## 2023-10-05 NOTE — Progress Notes (Signed)
 [facility] Progress Note     Subjective: CC:  Patient transferred downstairs for A-fib with RVR with sustained heart rates up into the 130s and 140s.  NG tube also came out yesterday and was replaced.  CT scan was repeated with no significant change and no contrast in the colon.   Patient denies abdominal pain.   Objective: Vital signs in last 24 hours: Temp:  [97.8 F (36.6 C)-98.7 F (37.1 C)] 98.3 F (36.8 C) (09/21 0826) Pulse Rate:  [39-144] 111 (09/21 0500) Resp:  [19-37] 31 (09/21 0500) BP: (93-131)/(56-87) 101/56 (09/21 0500) SpO2:  [93 %-100 %] 97 % (09/21 0500) Last BM Date : 09/29/23  Intake/Output from previous day: 09/20 0701 - 09/21 0700 In: 2993.8 [I.V.:2332.7; NG/GT:160; IV Piggyback:501] Out: 2150 [Urine:1625; Emesis/NG output:525] Intake/Output this shift: No intake/output data recorded.  PE: Gen:  Alert, NAD.  NGT in place with bilious output.  Gets upset with questioning when realizes that she doesn't remember what happened.   Abd: Soft, moderately distended, nontender. Hypoactive bowel sounds.     Lab Results:  Recent Labs    10/04/23 0540 10/05/23 0309  WBC 6.1 5.4  HGB 12.5 11.9*  HCT 42.1 39.4  PLT 310 314   BMET Recent Labs    10/04/23 0540 10/05/23 0309  NA 145 147*  K 3.6 3.7  CL 104 110  CO2 26 22  GLUCOSE 95 106*  BUN 39* 28*  CREATININE 1.06* 0.86  CALCIUM 10.5* 9.5   PT/INR No results for input(s): LABPROT, INR in the last 72 hours. CMP     Component Value Date/Time   NA 147 (H) 10/05/2023 0309   NA 139 01/26/2020 0000   NA 141 04/24/2012 0929   K 3.7 10/05/2023 0309   K 4.4 04/24/2012 0929   CL 110 10/05/2023 0309   CL 109 (H) 04/24/2012 0929   CO2 22 10/05/2023 0309   CO2 23 04/24/2012 0929   GLUCOSE 106 (H) 10/05/2023 0309   GLUCOSE 85 04/24/2012 0929   BUN 28 (H) 10/05/2023 0309   BUN 19 01/26/2020 0000   BUN 20.6 04/24/2012 0929   CREATININE 0.86 10/05/2023 0309   CREATININE 0.79  04/18/2023 1524   CREATININE 0.9 04/24/2012 0929   CALCIUM 9.5 10/05/2023 0309   CALCIUM 10.5 (H) 01/12/2020 2123   CALCIUM 10.0 04/24/2012 0929   PROT 6.5 10/03/2023 0443   PROT 6.7 04/24/2012 0929   ALBUMIN  3.1 (L) 10/05/2023 0309   ALBUMIN  3.5 04/24/2012 0929   AST 18 10/03/2023 0443   AST 20 04/24/2012 0929   ALT 7 10/03/2023 0443   ALT 12 04/24/2012 0929   ALKPHOS 65 10/03/2023 0443   ALKPHOS 75 04/24/2012 0929   BILITOT 0.6 10/03/2023 0443   BILITOT 0.36 04/24/2012 0929   GFRNONAA >60 10/05/2023 0309   GFRNONAA 58 (L) 05/11/2020 0700   GFRAA 68 05/11/2020 0700   Lipase     Component Value Date/Time   LIPASE 22 09/18/2013 1810       Studies/Results: CT CHEST ABDOMEN PELVIS WO CONTRAST Result Date: 10/05/2023 CLINICAL DATA:  Respiratory illness. Bowel obstruction. 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 EXAM: CT CHEST, ABDOMEN AND PELVIS WITHOUT CONTRAST TECHNIQUE: Multidetector CT imaging of the chest, abdomen and pelvis was performed following the standard protocol without IV contrast. RADIATION DOSE REDUCTION: This exam was performed according to the departmental dose-optimization program which includes automated exposure control, adjustment of the mA and/or kV according to  patient size and/or use of iterative reconstruction technique. COMPARISON:  Abdomen pelvis CT 10/01/2023.  Chest CT 04/24/2012 FINDINGS: CT CHEST FINDINGS Cardiovascular: The heart size is normal. No substantial pericardial effusion. Mild atherosclerotic calcification is noted in the wall of the thoracic aorta. Mediastinum/Nodes: No mediastinal lymphadenopathy. NG tube visualized in the esophagus. Mild circumferential wall thickening noted distal esophagus just proximal to a moderate hiatal hernia. No evidence for gross hilar lymphadenopathy although assessment is limited by the lack of intravenous contrast on the current study. The esophagus has normal imaging  features. Lungs/Pleura: 7 mm posterior right upper lobe nodule on 51/6 is new in the long interval since previous chest CT. Small airway impaction with tree-in-bud nodularity and patchy airspace disease noted in the posterior left upper lobe/lingula and dependent left lower lobe. Bronchiectasis with volume loss and consolidative opacity in the right middle lobe is progressive in the long interval since the prior study. There is some airway impaction and subtle clustered nodularity in the inferior right upper lobe. Patchy ill-defined nodular airspace opacity in the right lower lobe is associated with dependent collapse/consolidation. Small bilateral pleural effusions. Musculoskeletal: No worrisome lytic or sclerotic osseous abnormality. CT ABDOMEN PELVIS FINDINGS Hepatobiliary: Scattered tiny hypodensities in the liver parenchyma are too small to characterize but are statistically most likely benign. No followup imaging is recommended. High attenuation material in the lumen of the gallbladder is compatible with vicarious excretion of intravenous contrast material. No intrahepatic or extrahepatic biliary dilation. Pancreas: No focal mass lesion. No dilatation of the main duct. No intraparenchymal cyst. No peripancreatic edema. Spleen: No splenomegaly. No suspicious focal mass lesion. Adrenals/Urinary Tract: No adrenal nodule or mass. Scattered punctate calcification noted in the parenchyma of the right kidney. Cortical and central sinus cysts again noted left kidney. No evidence for hydroureter. The urinary bladder appears normal for the degree of distention. Stomach/Bowel: Stomach is decompressed spine in G-tube. NG tube tip is in the distal stomach. Duodenum is normally positioned as is the ligament of Treitz. Proximal jejunal loops are nondilated. Small bowel loops remain dilated in the abdomen and pelvis measuring up to 5.4 cm diameter. Proximal and distal small bowel is un opacified with some dilute contrast  identified in central dilated small bowel of the abdomen and pelvis which includes the small bowel anastomosis. There is an abrupt transition zone of dilated opacified small bowel in the right pelvis with a classic beak appearance (see axial 99/2). Immediately adjacent to this is a abrupt transition of un opacified small bowel immediately posterior to the transition in the opacified bowel (also image 99/2). A third area of transition is identified in the central pelvis involving another opacified small bowel loop adjacent to the anastomosis (best seen on coronal 44/4. Multiple adhesions and apparent mesenteric tethering or seen in the central pelvis involving these areas of transition. In area of apparent mesenteric compression is identified in the right pelvis on image 93/2 raising the question of internal hernia. There are completely decompressed small bowel loops in the right lower quadrant there appears to be a staple line in the right colon although ileocecal anatomy is not well demonstrated on this study. Colon is diffusely decompressed. No definite contrast material within the colonic lumen. Vascular/Lymphatic: There is mild atherosclerotic calcification of the abdominal aorta without aneurysm. There is no gastrohepatic or hepatoduodenal ligament lymphadenopathy. No retroperitoneal or mesenteric lymphadenopathy. No pelvic sidewall lymphadenopathy. Reproductive: There is no adnexal mass. Other: Scattered areas of interloop mesenteric fluid noted with small volume free fluid  identified in the pelvis. Musculoskeletal: No worrisome lytic or sclerotic osseous abnormality. IMPRESSION: 1. Small bowel loops remain dilated in the abdomen and pelvis measuring up to 5.4 cm diameter. There is some diluted contrast in the dilated small bowel loops, but no discernible contrast in the colonic lumen. The degree of small-bowel dilatation appears stable to mildly progressive in the interval. There there are multiple  relatively abrupt transition zones from dilated to nondilated small bowel in the central pelvis. Multiple adhesions and apparent mesenteric tethering or seen in the central pelvis involving these areas of transition. Appearance suggest mechanical obstruction due to multiple adhesions although an area of apparent mesenteric compression is identified in the right pelvis raising the question of internal hernia. Given the proximal jejunum and distal small bowel in the right lower quadrant are decompressed, closed loop obstruction also a consideration. No small bowel wall thickening. No evidence for pneumatosis. Multiple areas of interloop mesenteric fluid evident. 2. Small bilateral pleural effusions with dependent collapse/consolidation in the right lower lobe. 3. Small airway impaction with tree-in-bud nodularity and patchy airspace disease in the posterior left upper lobe/lingula and dependent left lower lobe. Imaging features are compatible with an infectious/inflammatory etiology. 4. Bronchiectasis with volume loss and consolidative opacity in the right middle lobe is progressive in the long interval since the prior study. This probably reflects chronic atelectasis/scarring. 5. 7 mm posterior right upper lobe pulmonary nodule is new in the long interval since previous chest CT. Non-contrast chest CT at 6-12 months is recommended. If the nodule is stable at time of repeat CT, then future CT at 18-24 months (from today's scan) is considered optional for low-risk patients, but is recommended for high-risk patients. This recommendation follows the consensus statement: Guidelines for Management of Incidental Pulmonary Nodules Detected on CT Images: From the Fleischner Society 2017; Radiology 2017; 284:228-243. 6. Mild circumferential wall thickening in the distal esophagus just proximal to a moderate hiatal hernia. Esophagitis would be a consideration. 7.  Aortic Atherosclerosis (ICD10-I70.0). Electronically Signed    By: Camellia Candle M.D.   On: 10/05/2023 05:56   ECHOCARDIOGRAM COMPLETE Result Date: 10/04/2023    ECHOCARDIOGRAM REPORT   Patient Name:   BLESSINGS INGLETT Mustapha Date of Exam: 10/04/2023 Medical Rec #:  [id number]       Height:       62.0 in Accession #:    [id number]      Weight:       110.0 lb Date of Birth:  Jan 03, 1941       BSA:          1.483 m Patient Age:    82 years        BP:           93/79 mmHg Patient Gender: F               HR:           112 bpm. Exam Location:  Inpatient Procedure: 2D Echo (Both Spectral and Color Flow Doppler were utilized during            procedure). Indications:    Afib  History:        Patient has no prior history of Echocardiogram examinations.                 Signs/Symptoms:Dyspnea.  Sonographer:    Norleen Amour Referring Phys: [id number] SYLVESTER I OGBATA IMPRESSIONS  1. Left ventricular ejection fraction, by estimation, is 60 to 65%. Left ventricular ejection fraction by  2D MOD biplane is 60.0 %. The left ventricle has normal function. The left ventricle has no regional wall motion abnormalities. There is mild left ventricular hypertrophy. Left ventricular diastolic function could not be evaluated.  2. Right ventricular systolic function is normal. The right ventricular size is normal. There is normal pulmonary artery systolic pressure. The estimated right ventricular systolic pressure is 26.6 mmHg.  3. The mitral valve is grossly normal. No evidence of mitral valve regurgitation.  4. The aortic valve is tricuspid. Aortic valve regurgitation is not visualized. Aortic valve sclerosis is present, with no evidence of aortic valve stenosis.  5. The inferior vena cava is normal in size with greater than 50% respiratory variability, suggesting right atrial pressure of 3 mmHg. Comparison(s): No prior Echocardiogram. FINDINGS  Left Ventricle: Left ventricular ejection fraction, by estimation, is 60 to 65%. Left ventricular ejection fraction by 2D MOD biplane is 60.0 %. The left ventricle has  normal function. The left ventricle has no regional wall motion abnormalities. The left ventricular internal cavity size was normal in size. There is mild left ventricular hypertrophy. Left ventricular diastolic function could not be evaluated due to atrial fibrillation. Left ventricular diastolic function could not be evaluated. Right Ventricle: The right ventricular size is normal. No increase in right ventricular wall thickness. Right ventricular systolic function is normal. There is normal pulmonary artery systolic pressure. The tricuspid regurgitant velocity is 2.43 m/s, and  with an assumed right atrial pressure of 3 mmHg, the estimated right ventricular systolic pressure is 26.6 mmHg. Left Atrium: Left atrial size was normal in size. Right Atrium: Right atrial size was normal in size. Pericardium: There is no evidence of pericardial effusion. Mitral Valve: The mitral valve is grossly normal. No evidence of mitral valve regurgitation. Tricuspid Valve: The tricuspid valve is grossly normal. Tricuspid valve regurgitation is mild. Aortic Valve: The aortic valve is tricuspid. Aortic valve regurgitation is not visualized. Aortic valve sclerosis is present, with no evidence of aortic valve stenosis. Pulmonic Valve: The pulmonic valve was normal in structure. Pulmonic valve regurgitation is not visualized. Aorta: The aortic root and ascending aorta are structurally normal, with no evidence of dilitation. Venous: The inferior vena cava is normal in size with greater than 50% respiratory variability, suggesting right atrial pressure of 3 mmHg. IAS/Shunts: The interatrial septum was not well visualized.  LEFT VENTRICLE PLAX 2D                        Biplane EF (MOD) LV PW:         1.05 cm         LV Biplane EF:   Left LV IVS:        1.05 cm                          ventricular LVOT diam:     1.70 cm                          ejection LV SV:         29                               fraction by LV SV Index:   19  2D MOD LVOT Area:     2.27 cm                         biplane is                                                 60.0 %.  LV Volumes (MOD)               Diastology LV vol d, MOD    23.2 ml       LV e' medial:    7.51 cm/s A2C:                           LV E/e' medial:  11.7 LV vol d, MOD    44.2 ml       LV e' lateral:   8.23 cm/s A4C:                           LV E/e' lateral: 10.7 LV vol s, MOD    12.8 ml A2C: LV vol s, MOD    12.4 ml A4C: LV SV MOD A2C:   10.4 ml LV SV MOD A4C:   44.2 ml LV SV MOD BP:    19.5 ml RIGHT VENTRICLE             IVC RV Basal diam:  2.50 cm     IVC diam: 0.90 cm RV S prime:     16.13 cm/s TAPSE (M-mode): 1.4 cm LEFT ATRIUM             Index        RIGHT ATRIUM           Index LA Vol (A2C):   45.1 ml 30.42 ml/m  RA Area:     11.30 cm LA Vol (A4C):   29.1 ml 19.62 ml/m  RA Volume:   24.50 ml  16.52 ml/m LA Biplane Vol: 36.8 ml 24.82 ml/m  AORTIC VALVE LVOT Vmax:   91.53 cm/s LVOT Vmean:  67.300 cm/s LVOT VTI:    0.127 m  AORTA Ao Root diam: 2.90 cm Ao Asc diam:  3.00 cm MV E velocity: 87.70 cm/s  TRICUSPID VALVE                            TR Peak grad:   23.6 mmHg                            TR Vmax:        243.00 cm/s                             SHUNTS                            Systemic VTI:  0.13 m                            Systemic Diam: 1.70 cm Vinie Maxcy MD Electronically signed by Vinie Maxcy MD Signature Date/Time: 10/04/2023/3:54:43 PM  Final    DG CHEST PORT 1 VIEW Result Date: 10/04/2023 CLINICAL DATA:  NG tube placement. EXAM: PORTABLE CHEST 1 VIEW COMPARISON:  Chest x-ray and KUB 10/04/2023 FINDINGS: Patient is slightly rotated to the right. Interval placement of nasogastric tube with tip and side-port over the stomach in the left upper quadrant. Lungs are adequately inflated with stable minimal hazy bibasilar opacification likely atelectasis versus edema and less likely infection. Cardiomediastinal silhouette is normal. Dilated air-filled  small bowel loop over the upper abdomen unchanged. Remainder of the exam is unchanged. IMPRESSION: 1. Interval placement of nasogastric tube with tip and side-port over the stomach in the left upper quadrant. 2. Stable minimal hazy bibasilar opacification likely atelectasis versus edema and less likely infection. Electronically Signed   By: Toribio Agreste M.D.   On: 10/04/2023 12:20   DG Chest Port 1 View Result Date: 10/04/2023 CLINICAL DATA:  NG tube placement. EXAM: PORTABLE CHEST 1 VIEW COMPARISON:  08/06/2023 FINDINGS: The NG tube is looped back on itself in the hypopharynx and does not extend down the esophagus. No pulmonary edema or focal airspace consolidation. Minimal basilar atelectasis with no substantial pleural effusion. Cardiopericardial silhouette is at upper limits of normal for size. Telemetry leads overlie the chest. IMPRESSION: NG tube is looped back on itself in the hypopharynx and does not extend down the esophagus. Tube should be removed and replaced. Repeat imaging after repositioning recommended. These results will be called to the ordering clinician or representative by the Radiologist Assistant, and communication documented in the PACS or [employer]. Electronically Signed   By: Camellia Candle M.D.   On: 10/04/2023 11:18   DG Abd Portable 1V Result Date: 10/04/2023 CLINICAL DATA:  Small bowel obstruction. EXAM: PORTABLE ABDOMEN - 1 VIEW COMPARISON:  10/03/2023 FINDINGS: NG tube tip is positioned in the distal stomach. Diffuse gaseous small bowel dilatation persists without substantial change. Small bowel loops are dilated up to 5.9 cm in the mid abdomen. IMPRESSION: Persistent diffuse gaseous small bowel dilatation compatible with small bowel obstruction. Electronically Signed   By: Camellia Candle M.D.   On: 10/04/2023 08:06    Anti-infectives: Anti-infectives (From admission, onward)    None        Assessment/Plan High grade pSBO Afib with RVR Dementia with  anxiety  - history of hysterectomy, extensive pelvic surgery for small bowel GIST - SBO protocol 9/18 >> contrast in the colon but ongoing dilated small bowel and she has no signs of bowel function. Continue NG To LIWS and await bowel function.  - failure to improve may warrant diagnostic laparoscopy vs laparotomy this admission, no role for emergent surgery  Cardiology working on heart rate this morning. If does not start passing gas soon definitely looking like patient less likely to resolve without surgery.  Per [facility] --  Dementia  GERD Tremors  HTN  Anxiety    LOS: 4 days   I reviewed nursing notes, ED provider notes, hospitalist notes, last 24 h vitals and pain scores, last 48 h intake and output, last 24 h labs and trends, and last 24 h imaging results.  This care required moderate level of medical decision making.  Jina LITTIE Nephew, MD, FACS, FSSO Surgical Oncology, General Surgery, Trauma and Critical [facility], [place] [phone] for weekday/non holidays Check [email or web address] for coverage night/weekend/holidays

## 2023-10-05 NOTE — Progress Notes (Signed)
 PT Cancellation Note  Patient Details Name: Tracey [name] MRN: [id number] DOB: Aug 02, 1940   Cancelled Treatment:     PT order received but eval deferred this date at request of RN 2* elevated HR.  Will follow and complete eval as pt condition allows.   Eutha Cude 10/05/2023, 8:41 AM

## 2023-10-06 DIAGNOSIS — K56609 Unspecified intestinal obstruction, unspecified as to partial versus complete obstruction: Secondary | ICD-10-CM | POA: Diagnosis not present

## 2023-10-06 LAB — RENAL FUNCTION PANEL
Albumin: 3.1 g/dL — ABNORMAL LOW (ref 3.5–5.0)
Albumin: 3.1 g/dL — ABNORMAL LOW (ref 3.5–5.0)
Albumin: 3.1 g/dL — ABNORMAL LOW (ref 3.5–5.0)
Albumin: 2.9 g/dL — ABNORMAL LOW (ref 3.5–5.0)
Anion gap: 11 (ref 5–15)
Anion gap: 11 (ref 5–15)
Anion gap: 10 (ref 5–15)
Anion gap: 10 (ref 5–15)
BUN: 19 mg/dL (ref 8–23)
BUN: 21 mg/dL (ref 8–23)
BUN: 20 mg/dL (ref 8–23)
BUN: 18 mg/dL (ref 8–23)
CO2: 25 mmol/L (ref 22–32)
CO2: 23 mmol/L (ref 22–32)
CO2: 24 mmol/L (ref 22–32)
CO2: 23 mmol/L (ref 22–32)
Calcium: 9.8 mg/dL (ref 8.9–10.3)
Calcium: 9.6 mg/dL (ref 8.9–10.3)
Calcium: 9.6 mg/dL (ref 8.9–10.3)
Calcium: 9.4 mg/dL (ref 8.9–10.3)
Chloride: 118 mmol/L — ABNORMAL HIGH (ref 98–111)
Chloride: 118 mmol/L — ABNORMAL HIGH (ref 98–111)
Chloride: 113 mmol/L — ABNORMAL HIGH (ref 98–111)
Chloride: 112 mmol/L — ABNORMAL HIGH (ref 98–111)
Creatinine, Ser: 0.88 mg/dL (ref 0.44–1.00)
Creatinine, Ser: 0.92 mg/dL (ref 0.44–1.00)
Creatinine, Ser: 0.99 mg/dL (ref 0.44–1.00)
Creatinine, Ser: 0.94 mg/dL (ref 0.44–1.00)
GFR, Estimated: >60 mL/min (ref >60)
GFR, Estimated: >60 mL/min (ref >60)
GFR, Estimated: 56 mL/min — ABNORMAL LOW (ref >60)
GFR, Estimated: >60 mL/min (ref >60)
Glucose, Bld: 151 mg/dL — ABNORMAL HIGH (ref 70–99)
Glucose, Bld: 176 mg/dL — ABNORMAL HIGH (ref 70–99)
Glucose, Bld: 118 mg/dL — ABNORMAL HIGH (ref 70–99)
Glucose, Bld: 128 mg/dL — ABNORMAL HIGH (ref 70–99)
Phosphorus: 1.8 mg/dL — ABNORMAL LOW (ref 2.5–4.6)
Phosphorus: 2.4 mg/dL — ABNORMAL LOW (ref 2.5–4.6)
Phosphorus: 2.7 mg/dL (ref 2.5–4.6)
Phosphorus: 2.5 mg/dL (ref 2.5–4.6)
Potassium: 4.1 mmol/L (ref 3.5–5.1)
Potassium: 4.2 mmol/L (ref 3.5–5.1)
Potassium: 4.4 mmol/L (ref 3.5–5.1)
Potassium: 3.8 mmol/L (ref 3.5–5.1)
Sodium: 153 mmol/L — ABNORMAL HIGH (ref 135–145)
Sodium: 152 mmol/L — ABNORMAL HIGH (ref 135–145)
Sodium: 147 mmol/L — ABNORMAL HIGH (ref 135–145)
Sodium: 145 mmol/L (ref 135–145)

## 2023-10-06 LAB — CBC
HCT: 40.5 % (ref 36.0–46.0)
Hemoglobin: 12.6 g/dL (ref 12.0–15.0)
MCH: 32.5 pg (ref 26.0–34.0)
MCHC: 31.1 g/dL (ref 30.0–36.0)
MCV: 104.4 fL — ABNORMAL HIGH (ref 80.0–100.0)
Platelets: 365 K/uL (ref 150–400)
RBC: 3.88 MIL/uL (ref 3.87–5.11)
RDW: 13.6 % (ref 11.5–15.5)
WBC: 6.5 K/uL (ref 4.0–10.5)
nRBC: 0 % (ref 0.0–0.2)

## 2023-10-06 LAB — BASIC METABOLIC PANEL WITH GFR
Anion gap: 11 (ref 5–15)
BUN: 20 mg/dL (ref 8–23)
CO2: 23 mmol/L (ref 22–32)
Calcium: 9.4 mg/dL (ref 8.9–10.3)
Chloride: 114 mmol/L — ABNORMAL HIGH (ref 98–111)
Creatinine, Ser: 0.96 mg/dL (ref 0.44–1.00)
GFR, Estimated: 59 mL/min — ABNORMAL LOW (ref >60)
Glucose, Bld: 116 mg/dL — ABNORMAL HIGH (ref 70–99)
Potassium: 4.4 mmol/L (ref 3.5–5.1)
Sodium: 148 mmol/L — ABNORMAL HIGH (ref 135–145)

## 2023-10-06 LAB — MAGNESIUM: Magnesium: 2.5 mg/dL — ABNORMAL HIGH (ref 1.7–2.4)

## 2023-10-06 LAB — HEPARIN LEVEL (UNFRACTIONATED): Heparin Unfractionated: 0.37 [IU]/mL (ref 0.30–0.70)

## 2023-10-06 MED ORDER — CIPROFLOXACIN-DEXAMETHASONE 0.3-0.1 % OT SUSP
4.0000 [drp] | Freq: Two times a day (BID) | OTIC | Status: AC
  Administered 2023-10-06 – 2023-10-10 (×9): 4 [drp] via OTIC
  Filled 2023-10-06: qty 7.5

## 2023-10-06 MED ORDER — LIDOCAINE 5 % EX PTCH
1.0000 | MEDICATED_PATCH | CUTANEOUS | Status: DC
  Administered 2023-10-06 – 2023-10-27 (×22): 1 via TRANSDERMAL
  Filled 2023-10-06 (×22): qty 1

## 2023-10-06 MED ORDER — DEXTROSE 5 % IV SOLN
75.0000 mL/h | INTRAVENOUS | Status: DC
Start: 2023-10-06 — End: 2023-10-06
  Administered 2023-10-06: 75 mL/h via INTRAVENOUS

## 2023-10-06 MED ORDER — POTASSIUM PHOSPHATES 15 MMOLE/5ML IV SOLN
30.0000 mmol | Freq: Once | INTRAVENOUS | Status: AC
  Administered 2023-10-06: 30 mmol via INTRAVENOUS
  Filled 2023-10-06: qty 10

## 2023-10-06 MED ORDER — MORPHINE SULFATE (PF) 2 MG/ML IV SOLN
1.0000 mg | Freq: Once | INTRAVENOUS | Status: AC
  Administered 2023-10-06: 1 mg via INTRAVENOUS
  Filled 2023-10-06: qty 1

## 2023-10-06 MED ORDER — DEXTROSE 5 % IV SOLN: INTRAVENOUS | Status: DC

## 2023-10-06 NOTE — Evaluation (Signed)
 Physical Therapy Evaluation Patient Details Name: Tracey [name] MRN: [id number] DOB: [birth year] Today's Date: 10/06/2023  History of Present Illness  Patient is an 83 yo female admitted with SBO. PMH: cognitive impairment, bipolar disorder, possible Parkinsons disease and h/o rection of GIST tumor, glaucoma, HTN  Clinical Impression  Pt admitted with above diagnosis. Pt reports from [facility] [place], reports has rollator that she uses intermittently, reports past success with therapy. On eval, pt noted to have generalized weakness, needing verbal cues for sequencing, +2 for safety for line management. Pt needing min A for powering up to sitting EOB, min A+2 for transfers and ambulation with RW, +2 for line management, needing assist to steady with posterior and lateral lean in standing that improves with time. Pt amb short distance with short shuffling steps and forward flexed trunk, needing assist with RW management. Pt HR up to 65 with gait; noted to be 50 in supine at beginning of session and 53 at EOS up in recliner. RN in room at beginning of session and notified of HR at EOS. Patient will benefit from continued inpatient follow up therapy, <3 hours/day. Pt currently with functional limitations due to the deficits listed below (see PT Problem List). Pt will benefit from acute skilled PT to increase their independence and safety with mobility to allow discharge.           If plan is discharge home, recommend the following: A little help with walking and/or transfers;A little help with bathing/dressing/bathroom;Assistance with cooking/housework;Assist for transportation   Can travel by private vehicle   Yes    Equipment Recommendations None recommended by PT  Recommendations for Other Services       Functional Status Assessment Patient has had a recent decline in their functional status and demonstrates the ability to make significant improvements in function in a reasonable and  predictable amount of time.     Precautions / Restrictions Precautions Precautions: Fall Recall of Precautions/Restrictions: Impaired Precaution/Restrictions Comments: NG tube Restrictions Weight Bearing Restrictions Per Provider Order: No      Mobility  Bed Mobility Overal bed mobility: Needs Assistance Bed Mobility: Supine to Sit     Supine to sit: Min assist, Used rails, HOB elevated     General bed mobility comments: min A to power up to sitting EOB, pt using bedrails and elevated HOB, slow to mobilize    Transfers Overall transfer level: Needs assistance Equipment used: Rolling walker (2 wheels) Transfers: Sit to/from Stand Sit to Stand: Min assist           General transfer comment: min A to steady and power up to standing, BLE braced against bedrail, slight posterior and R lean in standing that improves with time    Ambulation/Gait Ambulation/Gait assistance: Min assist, +2 safety/equipment Gait Distance (Feet): 12 Feet Assistive device: Rolling walker (2 wheels) Gait Pattern/deviations: Step-to pattern, Decreased stride length, Shuffle, Trunk flexed Gait velocity: decreased     General Gait Details: slow, short shulffing steps with trunk forward flexed, +2 for safety for line management, assist for RW management to ensure body within frame  Stairs            Wheelchair Mobility     Tilt Bed    Modified Rankin (Stroke Patients Only)       Balance Overall balance assessment: Needs assistance Sitting-balance support: Feet supported Sitting balance-Leahy Scale: Fair     Standing balance support: Reliant on assistive device for balance, During functional activity, Bilateral upper extremity supported  Standing balance-Leahy Scale: Poor                               Pertinent Vitals/Pain Pain Assessment Pain Assessment: Faces Faces Pain Scale: Hurts little more Pain Location: L ear Pain Descriptors / Indicators:  Discomfort Pain Intervention(s): Limited activity within patient's tolerance, Monitored during session, Repositioned    Home Living Family/patient expects to be discharged to:: Assisted living                 Home Equipment: Rollator (4 wheels) Additional Comments: Per pt, living at [facility] [place], has a rollator and uses it intermittently, enjoys doing therapy and exercises classes    Prior Function Prior Level of Function : Independent/Modified Independent;Needs assist             Mobility Comments: per pt, ind with rollator intermittently ADLs Comments: per pt, pt reports staff provides meals, doesn't detail self care, no family present to provide PLOF     Extremity/Trunk Assessment   Upper Extremity Assessment Upper Extremity Assessment: Generalized weakness    Lower Extremity Assessment Lower Extremity Assessment: Generalized weakness (AROM WFL, strength grossly 3+/5, denies numbness/tingling)    Cervical / Trunk Assessment Cervical / Trunk Assessment: Kyphotic  Communication   Communication Communication: Impaired Factors Affecting Communication: Other (comment) (soft spoken)    Cognition Arousal: Lethargic Behavior During Therapy: WFL for tasks assessed/performed   PT - Cognitive impairments: History of cognitive impairments                       PT - Cognition Comments: pt follows commands with increased time and cues, pleasantly confused, repetitively thankful towards therapy and nursing Following commands: Intact       Cueing       General Comments      Exercises     Assessment/Plan    PT Assessment Patient needs continued PT services  PT Problem List Decreased strength;Decreased activity tolerance;Decreased balance;Decreased mobility;Decreased cognition;Decreased knowledge of use of DME;Decreased safety awareness;Decreased knowledge of precautions;Cardiopulmonary status limiting activity       PT Treatment Interventions DME  instruction;Gait training;Functional mobility training;Therapeutic activities;Therapeutic exercise;Balance training;Neuromuscular re-education;Patient/family education    PT Goals (Current goals can be found in the Care Plan section)  Acute Rehab PT Goals Patient Stated Goal: I love physical therapy PT Goal Formulation: With patient Time For Goal Achievement: 10/20/23 Potential to Achieve Goals: Good    Frequency Min 2X/week     Co-evaluation               AM-PAC PT 6 Clicks Mobility  Outcome Measure Help needed turning from your back to your side while in a flat bed without using bedrails?: A Little Help needed moving from lying on your back to sitting on the side of a flat bed without using bedrails?: A Little Help needed moving to and from a bed to a chair (including a wheelchair)?: A Little Help needed standing up from a chair using your arms (e.g., wheelchair or bedside chair)?: A Little Help needed to walk in hospital room?: A Lot Help needed climbing 3-5 steps with a railing? : Total 6 Click Score: 15    End of Session Equipment Utilized During Treatment: Gait belt Activity Tolerance: Patient tolerated treatment well Patient left: in chair;with call bell/phone within reach;with chair alarm set Nurse Communication: Mobility status PT Visit Diagnosis: Muscle weakness (generalized) (M62.81);Other abnormalities of gait and  mobility (R26.89);Unsteadiness on feet (R26.81);Other symptoms and signs involving the nervous system (R29.898)    Time: 8798-8778 PT Time Calculation (min) (ACUTE ONLY): 20 min   Charges:   PT Evaluation $PT Eval Moderate Complexity: 1 Mod   PT General Charges $$ ACUTE PT VISIT: 1 Visit         Tori Tahesha Skeet PT, DPT 10/06/23, 1:09 PM

## 2023-10-06 NOTE — Progress Notes (Addendum)
 [facility] Progress Note     Subjective: Rate control improved today. Patient denies abdominal pain. Mildly confused, not sure if she is passing flatus. Does not appear she has had any bowel movements on chart review.   Objective: Vital signs in last 24 hours: Temp:  [97.1 F (36.2 C)-98.4 F (36.9 C)] 97.8 F (36.6 C) (09/22 0800) Pulse Rate:  [54-129] 58 (09/22 0900) Resp:  [9-35] 9 (09/22 0900) BP: (97-146)/(52-102) 146/73 (09/22 0900) SpO2:  [95 %-100 %] 98 % (09/22 0900) Weight:  [49 kg] 49 kg (09/22 0500) Last BM Date : 09/29/23  Intake/Output from previous day: 09/21 0701 - 09/22 0700 In: 2733.9 [I.V.:2437.7; NG/GT:30; IV Piggyback:266.2] Out: 3090 [Urine:1520; Emesis/NG output:1570] Intake/Output this shift: Total I/O In: 381 [I.V.:281.4; IV Piggyback:99.6] Out: -   PE: Gen:  Alert, NAD.  NGT in place with bilious output.  Gets upset with questioning when realizes that she doesn't remember what happened.   Abd: Soft, moderately distended, nontender. Hypoactive bowel sounds.     Lab Results:  Recent Labs    10/05/23 0309 10/06/23 0300  WBC 5.4 6.5  HGB 11.9* 12.6  HCT 39.4 40.5  PLT 314 365   BMET Recent Labs    10/05/23 0309 10/06/23 0300  NA 147* 153*  K 3.7 4.1  CL 110 118*  CO2 22 25  GLUCOSE 106* 151*  BUN 28* 19  CREATININE 0.86 0.88  CALCIUM 9.5 9.8   PT/INR No results for input(s): LABPROT, INR in the last 72 hours. CMP     Component Value Date/Time   NA 153 (H) 10/06/2023 0300   NA 139 01/26/2020 0000   NA 141 04/24/2012 0929   K 4.1 10/06/2023 0300   K 4.4 04/24/2012 0929   CL 118 (H) 10/06/2023 0300   CL 109 (H) 04/24/2012 0929   CO2 25 10/06/2023 0300   CO2 23 04/24/2012 0929   GLUCOSE 151 (H) 10/06/2023 0300   GLUCOSE 85 04/24/2012 0929   BUN 19 10/06/2023 0300   BUN 19 01/26/2020 0000   BUN 20.6 04/24/2012 0929   CREATININE 0.88 10/06/2023 0300   CREATININE 0.79 04/18/2023 1524   CREATININE 0.9  04/24/2012 0929   CALCIUM 9.8 10/06/2023 0300   CALCIUM 10.5 (H) 01/12/2020 2123   CALCIUM 10.0 04/24/2012 0929   PROT 6.5 10/03/2023 0443   PROT 6.7 04/24/2012 0929   ALBUMIN  3.1 (L) 10/06/2023 0300   ALBUMIN  3.5 04/24/2012 0929   AST 18 10/03/2023 0443   AST 20 04/24/2012 0929   ALT 7 10/03/2023 0443   ALT 12 04/24/2012 0929   ALKPHOS 65 10/03/2023 0443   ALKPHOS 75 04/24/2012 0929   BILITOT 0.6 10/03/2023 0443   BILITOT 0.36 04/24/2012 0929   GFRNONAA >60 10/06/2023 0300   GFRNONAA 58 (L) 05/11/2020 0700   GFRAA 68 05/11/2020 0700   Lipase     Component Value Date/Time   LIPASE 22 09/18/2013 1810       Studies/Results: CT CHEST ABDOMEN PELVIS WO CONTRAST Result Date: 10/05/2023 CLINICAL DATA:  Respiratory illness. Bowel obstruction. 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 EXAM: CT CHEST, ABDOMEN AND PELVIS WITHOUT CONTRAST TECHNIQUE: Multidetector CT imaging of the chest, abdomen and pelvis was performed following the standard protocol without IV contrast. RADIATION DOSE REDUCTION: This exam was performed according to the departmental dose-optimization program which includes automated exposure control, adjustment of the mA and/or kV according to patient size and/or use of iterative reconstruction  technique. COMPARISON:  Abdomen pelvis CT 10/01/2023.  Chest CT 04/24/2012 FINDINGS: CT CHEST FINDINGS Cardiovascular: The heart size is normal. No substantial pericardial effusion. Mild atherosclerotic calcification is noted in the wall of the thoracic aorta. Mediastinum/Nodes: No mediastinal lymphadenopathy. NG tube visualized in the esophagus. Mild circumferential wall thickening noted distal esophagus just proximal to a moderate hiatal hernia. No evidence for gross hilar lymphadenopathy although assessment is limited by the lack of intravenous contrast on the current study. The esophagus has normal imaging features. Lungs/Pleura: 7 mm posterior  right upper lobe nodule on 51/6 is new in the long interval since previous chest CT. Small airway impaction with tree-in-bud nodularity and patchy airspace disease noted in the posterior left upper lobe/lingula and dependent left lower lobe. Bronchiectasis with volume loss and consolidative opacity in the right middle lobe is progressive in the long interval since the prior study. There is some airway impaction and subtle clustered nodularity in the inferior right upper lobe. Patchy ill-defined nodular airspace opacity in the right lower lobe is associated with dependent collapse/consolidation. Small bilateral pleural effusions. Musculoskeletal: No worrisome lytic or sclerotic osseous abnormality. CT ABDOMEN PELVIS FINDINGS Hepatobiliary: Scattered tiny hypodensities in the liver parenchyma are too small to characterize but are statistically most likely benign. No followup imaging is recommended. High attenuation material in the lumen of the gallbladder is compatible with vicarious excretion of intravenous contrast material. No intrahepatic or extrahepatic biliary dilation. Pancreas: No focal mass lesion. No dilatation of the main duct. No intraparenchymal cyst. No peripancreatic edema. Spleen: No splenomegaly. No suspicious focal mass lesion. Adrenals/Urinary Tract: No adrenal nodule or mass. Scattered punctate calcification noted in the parenchyma of the right kidney. Cortical and central sinus cysts again noted left kidney. No evidence for hydroureter. The urinary bladder appears normal for the degree of distention. Stomach/Bowel: Stomach is decompressed spine in G-tube. NG tube tip is in the distal stomach. Duodenum is normally positioned as is the ligament of Treitz. Proximal jejunal loops are nondilated. Small bowel loops remain dilated in the abdomen and pelvis measuring up to 5.4 cm diameter. Proximal and distal small bowel is un opacified with some dilute contrast identified in central dilated small bowel  of the abdomen and pelvis which includes the small bowel anastomosis. There is an abrupt transition zone of dilated opacified small bowel in the right pelvis with a classic beak appearance (see axial 99/2). Immediately adjacent to this is a abrupt transition of un opacified small bowel immediately posterior to the transition in the opacified bowel (also image 99/2). A third area of transition is identified in the central pelvis involving another opacified small bowel loop adjacent to the anastomosis (best seen on coronal 44/4. Multiple adhesions and apparent mesenteric tethering or seen in the central pelvis involving these areas of transition. In area of apparent mesenteric compression is identified in the right pelvis on image 93/2 raising the question of internal hernia. There are completely decompressed small bowel loops in the right lower quadrant there appears to be a staple line in the right colon although ileocecal anatomy is not well demonstrated on this study. Colon is diffusely decompressed. No definite contrast material within the colonic lumen. Vascular/Lymphatic: There is mild atherosclerotic calcification of the abdominal aorta without aneurysm. There is no gastrohepatic or hepatoduodenal ligament lymphadenopathy. No retroperitoneal or mesenteric lymphadenopathy. No pelvic sidewall lymphadenopathy. Reproductive: There is no adnexal mass. Other: Scattered areas of interloop mesenteric fluid noted with small volume free fluid identified in the pelvis. Musculoskeletal: No worrisome  lytic or sclerotic osseous abnormality. IMPRESSION: 1. Small bowel loops remain dilated in the abdomen and pelvis measuring up to 5.4 cm diameter. There is some diluted contrast in the dilated small bowel loops, but no discernible contrast in the colonic lumen. The degree of small-bowel dilatation appears stable to mildly progressive in the interval. There there are multiple relatively abrupt transition zones from dilated to  nondilated small bowel in the central pelvis. Multiple adhesions and apparent mesenteric tethering or seen in the central pelvis involving these areas of transition. Appearance suggest mechanical obstruction due to multiple adhesions although an area of apparent mesenteric compression is identified in the right pelvis raising the question of internal hernia. Given the proximal jejunum and distal small bowel in the right lower quadrant are decompressed, closed loop obstruction also a consideration. No small bowel wall thickening. No evidence for pneumatosis. Multiple areas of interloop mesenteric fluid evident. 2. Small bilateral pleural effusions with dependent collapse/consolidation in the right lower lobe. 3. Small airway impaction with tree-in-bud nodularity and patchy airspace disease in the posterior left upper lobe/lingula and dependent left lower lobe. Imaging features are compatible with an infectious/inflammatory etiology. 4. Bronchiectasis with volume loss and consolidative opacity in the right middle lobe is progressive in the long interval since the prior study. This probably reflects chronic atelectasis/scarring. 5. 7 mm posterior right upper lobe pulmonary nodule is new in the long interval since previous chest CT. Non-contrast chest CT at 6-12 months is recommended. If the nodule is stable at time of repeat CT, then future CT at 18-24 months (from today's scan) is considered optional for low-risk patients, but is recommended for high-risk patients. This recommendation follows the consensus statement: Guidelines for Management of Incidental Pulmonary Nodules Detected on CT Images: From the Fleischner Society 2017; Radiology 2017; 284:228-243. 6. Mild circumferential wall thickening in the distal esophagus just proximal to a moderate hiatal hernia. Esophagitis would be a consideration. 7.  Aortic Atherosclerosis (ICD10-I70.0). Electronically Signed   By: Camellia Candle M.D.   On: 10/05/2023 05:56    ECHOCARDIOGRAM COMPLETE Result Date: 10/04/2023    ECHOCARDIOGRAM REPORT   Patient Name:   Tracey [name] Date of Exam: 10/04/2023 Medical Rec #:  [id number]       Height:       62.0 in Accession #:    [id number]      Weight:       110.0 lb Date of Birth:  25-Sep-1940       BSA:          1.483 m Patient Age:    82 years        BP:           93/79 mmHg Patient Gender: F               HR:           112 bpm. Exam Location:  Inpatient Procedure: 2D Echo (Both Spectral and Color Flow Doppler were utilized during            procedure). Indications:    Afib  History:        Patient has no prior history of Echocardiogram examinations.                 Signs/Symptoms:Dyspnea.  Sonographer:    Norleen Amour Referring Phys: [id number] SYLVESTER I OGBATA IMPRESSIONS  1. Left ventricular ejection fraction, by estimation, is 60 to 65%. Left ventricular ejection fraction by 2D MOD biplane is 60.0 %. The  left ventricle has normal function. The left ventricle has no regional wall motion abnormalities. There is mild left ventricular hypertrophy. Left ventricular diastolic function could not be evaluated.  2. Right ventricular systolic function is normal. The right ventricular size is normal. There is normal pulmonary artery systolic pressure. The estimated right ventricular systolic pressure is 26.6 mmHg.  3. The mitral valve is grossly normal. No evidence of mitral valve regurgitation.  4. The aortic valve is tricuspid. Aortic valve regurgitation is not visualized. Aortic valve sclerosis is present, with no evidence of aortic valve stenosis.  5. The inferior vena cava is normal in size with greater than 50% respiratory variability, suggesting right atrial pressure of 3 mmHg. Comparison(s): No prior Echocardiogram. FINDINGS  Left Ventricle: Left ventricular ejection fraction, by estimation, is 60 to 65%. Left ventricular ejection fraction by 2D MOD biplane is 60.0 %. The left ventricle has normal function. The left ventricle has no  regional wall motion abnormalities. The left ventricular internal cavity size was normal in size. There is mild left ventricular hypertrophy. Left ventricular diastolic function could not be evaluated due to atrial fibrillation. Left ventricular diastolic function could not be evaluated. Right Ventricle: The right ventricular size is normal. No increase in right ventricular wall thickness. Right ventricular systolic function is normal. There is normal pulmonary artery systolic pressure. The tricuspid regurgitant velocity is 2.43 m/s, and  with an assumed right atrial pressure of 3 mmHg, the estimated right ventricular systolic pressure is 26.6 mmHg. Left Atrium: Left atrial size was normal in size. Right Atrium: Right atrial size was normal in size. Pericardium: There is no evidence of pericardial effusion. Mitral Valve: The mitral valve is grossly normal. No evidence of mitral valve regurgitation. Tricuspid Valve: The tricuspid valve is grossly normal. Tricuspid valve regurgitation is mild. Aortic Valve: The aortic valve is tricuspid. Aortic valve regurgitation is not visualized. Aortic valve sclerosis is present, with no evidence of aortic valve stenosis. Pulmonic Valve: The pulmonic valve was normal in structure. Pulmonic valve regurgitation is not visualized. Aorta: The aortic root and ascending aorta are structurally normal, with no evidence of dilitation. Venous: The inferior vena cava is normal in size with greater than 50% respiratory variability, suggesting right atrial pressure of 3 mmHg. IAS/Shunts: The interatrial septum was not well visualized.  LEFT VENTRICLE PLAX 2D                        Biplane EF (MOD) LV PW:         1.05 cm         LV Biplane EF:   Left LV IVS:        1.05 cm                          ventricular LVOT diam:     1.70 cm                          ejection LV SV:         29                               fraction by LV SV Index:   19  2D MOD LVOT Area:      2.27 cm                         biplane is                                                 60.0 %.  LV Volumes (MOD)               Diastology LV vol d, MOD    23.2 ml       LV e' medial:    7.51 cm/s A2C:                           LV E/e' medial:  11.7 LV vol d, MOD    44.2 ml       LV e' lateral:   8.23 cm/s A4C:                           LV E/e' lateral: 10.7 LV vol s, MOD    12.8 ml A2C: LV vol s, MOD    12.4 ml A4C: LV SV MOD A2C:   10.4 ml LV SV MOD A4C:   44.2 ml LV SV MOD BP:    19.5 ml RIGHT VENTRICLE             IVC RV Basal diam:  2.50 cm     IVC diam: 0.90 cm RV S prime:     16.13 cm/s TAPSE (M-mode): 1.4 cm LEFT ATRIUM             Index        RIGHT ATRIUM           Index LA Vol (A2C):   45.1 ml 30.42 ml/m  RA Area:     11.30 cm LA Vol (A4C):   29.1 ml 19.62 ml/m  RA Volume:   24.50 ml  16.52 ml/m LA Biplane Vol: 36.8 ml 24.82 ml/m  AORTIC VALVE LVOT Vmax:   91.53 cm/s LVOT Vmean:  67.300 cm/s LVOT VTI:    0.127 m  AORTA Ao Root diam: 2.90 cm Ao Asc diam:  3.00 cm MV E velocity: 87.70 cm/s  TRICUSPID VALVE                            TR Peak grad:   23.6 mmHg                            TR Vmax:        243.00 cm/s                             SHUNTS                            Systemic VTI:  0.13 m                            Systemic Diam: 1.70 cm Vinie Maxcy MD Electronically signed by Vinie Maxcy MD Signature Date/Time: 10/04/2023/3:54:43 PM  Final    DG CHEST PORT 1 VIEW Result Date: 10/04/2023 CLINICAL DATA:  NG tube placement. EXAM: PORTABLE CHEST 1 VIEW COMPARISON:  Chest x-ray and KUB 10/04/2023 FINDINGS: Patient is slightly rotated to the right. Interval placement of nasogastric tube with tip and side-port over the stomach in the left upper quadrant. Lungs are adequately inflated with stable minimal hazy bibasilar opacification likely atelectasis versus edema and less likely infection. Cardiomediastinal silhouette is normal. Dilated air-filled small bowel loop over the upper abdomen  unchanged. Remainder of the exam is unchanged. IMPRESSION: 1. Interval placement of nasogastric tube with tip and side-port over the stomach in the left upper quadrant. 2. Stable minimal hazy bibasilar opacification likely atelectasis versus edema and less likely infection. Electronically Signed   By: Toribio Agreste M.D.   On: 10/04/2023 12:20   DG Chest Port 1 View Result Date: 10/04/2023 CLINICAL DATA:  NG tube placement. EXAM: PORTABLE CHEST 1 VIEW COMPARISON:  08/06/2023 FINDINGS: The NG tube is looped back on itself in the hypopharynx and does not extend down the esophagus. No pulmonary edema or focal airspace consolidation. Minimal basilar atelectasis with no substantial pleural effusion. Cardiopericardial silhouette is at upper limits of normal for size. Telemetry leads overlie the chest. IMPRESSION: NG tube is looped back on itself in the hypopharynx and does not extend down the esophagus. Tube should be removed and replaced. Repeat imaging after repositioning recommended. These results will be called to the ordering clinician or representative by the Radiologist Assistant, and communication documented in the PACS or [employer]. Electronically Signed   By: Camellia Candle M.D.   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 Repeat CT yesterday shows persistently dilated small bowel loops with no contrast in colon. - Patient does not have abdominal pain and exam is benign, however she still has not had return of bowel function. At this point favor operative intervention as she is unlikely to resolve the obstruction with medical management. I discussed this with  patient this morning. She is mildly confused and has dementia, will reach out to family today to discuss further. - Tentatively plan for surgery tomorrow if there is still no return of bowel function. Will need to hold heparin  gtt in am. - Continue NG decompression, remain NPO   Per [facility] --  Dementia  GERD Tremors  HTN  Anxiety    LOS: 5 days   I reviewed nursing notes, ED provider notes, hospitalist notes, last 24 h vitals and pain scores, last 48 h intake and output, last 24 h labs and trends, and last 24 h imaging results.  This care required moderate level of medical decision making.   Leonor Dawn, MD [facility] General, Hepatobiliary and Pancreatic Surgery 10/06/23 9:55 AM  Addendum 1:38pm: Confirmed with RN that patient has not had any bowel movements this morning. Spoke with patient's son Kimberleigh Mehan via phone. Discussed that she has persistent obstructive symptoms, and at this point is not very likely to resolve without surgery. Discussed a diagnostic laparoscopy, with possible laparotomy, as early as tomorrow if there is no return of bowel function by tomorrow morning. I asked if  he and his mother would want her to have surgery if her obstruction does not improve. He leans toward proceeding with surgery but would like to think about it. Will plan for repeat KUB tomorrow morning. If no bowel function in the morning, tentatively plan for surgery pending follow up discussions with family.

## 2023-10-06 NOTE — Progress Notes (Signed)
 Initial Nutrition Assessment  DOCUMENTATION CODES:   Severe malnutrition in context of chronic illness  INTERVENTION:  - Patient now NPO x5 days.   - If unable to start enteral nutrition in the next 1-2 days, recommend TPN.   NUTRITION DIAGNOSIS:   Severe Malnutrition related to chronic illness as evidenced by severe fat depletion, severe muscle depletion, percent weight loss (11% in 6 months).  GOAL:   Patient will meet greater than or equal to 90% of their needs  MONITOR:   Diet advancement, Labs, Weight trends, I & O's  REASON FOR ASSESSMENT:   NPO/Clear Liquid Diet (x5 days)    ASSESSMENT:   83 y.o. female with PMH significant for dementia, cognitive impairment, bipolar disorder, possible Parkinsons disease and h/o rection of GIST tumor who presented with complaints of abdominal pain nausea vomiting and a lot of abdominal distention. Admitted for small bowel obstruction.   9/17 Admit; NPO; NGT placed  Patient in bed at time of visit working with RN.  She is noted to have a history of dementia. Patient seemed a little confused but answered all questions appropriately.  Reports she is unsure of a UBW but feels she has lost weight recently. Per EMR, patient has had a 13# or 11% weight loss in the past 6 months, which is significant for the time frame.   She reports eating well PTA and before her SBO symptoms began.   Patient has now been NPO x5 days. NGT remains to LIS. Per surgery note today, patient has yet to have return of bowel functions so they are recommending operative intervention. Plan to repeat KUB tomorrow morning and if no bowel function tentative plan for surgery pending family decisions.  Given patient NPO x5 days and now with possible plans for surgery, recommend TPN as soon as medically appropriate.     Medications reviewed and include: D5 @ 132mL/hr (provides 408 kcals over 24 hours)  Labs reviewed:  Na 152 Phosphorus 2.4   NUTRITION - FOCUSED  PHYSICAL EXAM:  Flowsheet Row Most Recent Value  Orbital Region Severe depletion  Upper Arm Region Moderate depletion  Thoracic and Lumbar Region Severe depletion  Buccal Region Moderate depletion  Temple Region Severe depletion  Clavicle Bone Region Severe depletion  Clavicle and Acromion Bone Region Severe depletion  Scapular Bone Region Unable to assess  Dorsal Hand Moderate depletion  Patellar Region Moderate depletion  Anterior Thigh Region Moderate depletion  Posterior Calf Region Mild depletion  Edema (RD Assessment) None  Hair Reviewed  Eyes Reviewed  Mouth Reviewed  Skin Reviewed  Nails Reviewed    Diet Order:   Diet Order             Diet NPO time specified Except for: Ice Chips  Diet effective now                   EDUCATION NEEDS:  No education needs have been identified at this time  Skin:  Skin Assessment: Reviewed RN Assessment  Last BM:  PTA  Height:  Ht Readings from Last 1 Encounters:  10/02/23 5' 2 (1.575 m)   Weight:  Wt Readings from Last 1 Encounters:  10/06/23 49 kg   BMI:  Body mass index is 19.76 kg/m.  Estimated Nutritional Needs:  Kcal:  1500-1700 kcals Protein:  75-85 grams Fluid:  >/= 1.5L    Trude Ned RD, LDN Contact via Secure Chat.

## 2023-10-06 NOTE — Progress Notes (Signed)
 Subjective:  Up in chair denies any chest pain or shortness of breath denies abdominal pain patient converted back into sinus rhythm sinus bradycardia on the monitor patient is off IV amiodarone  for now  Objective:  Vital Signs in the last 24 hours: Temp:  [97.1 F (36.2 C)-98.4 F (36.9 C)] 98.2 F (36.8 C) (09/22 1144) Pulse Rate:  [48-107] 48 (09/22 1100) Resp:  [9-35] 13 (09/22 1100) BP: (97-151)/(52-102) 147/65 (09/22 1100) SpO2:  [95 %-100 %] 100 % (09/22 1100) Weight:  [49 kg] 49 kg (09/22 0500)  Intake/Output from previous day: 09/21 0701 - 09/22 0700 In: 2733.9 [I.V.:2437.7; NG/GT:30; IV Piggyback:266.2] Out: 3090 [Urine:1520; Emesis/NG output:1570] Intake/Output from this shift: Total I/O In: 381 [I.V.:281.4; IV Piggyback:99.6] Out: -   Physical Exam: Neck: no adenopathy, no carotid bruit, no JVD, and supple, symmetrical, trachea midline Lungs: Decreased breath sound at bases with occasional rhonchi Heart: Bradycardic S1-S2 soft 2/6 systolic murmur noted Abdomen: Soft mildly distended faint bowel sounds noted nontender Extremities: extremities normal, atraumatic, no cyanosis or edema  Lab Results: Recent Labs    10/05/23 0309 10/06/23 0300  WBC 5.4 6.5  HGB 11.9* 12.6  PLT 314 365   Recent Labs    10/06/23 0300 10/06/23 1050  NA 153* 152*  K 4.1 4.2  CL 118* 118*  CO2 25 23  GLUCOSE 151* 176*  BUN 19 21  CREATININE 0.88 0.92   No results for input(s): TROPONINI in the last 72 hours.  Invalid input(s): CK, MB Hepatic Function Panel Recent Labs    10/06/23 1050  ALBUMIN  3.1*   No results for input(s): CHOL in the last 72 hours. No results for input(s): PROTIME in the last 72 hours.  Imaging: Imaging results have been reviewed and CT CHEST ABDOMEN PELVIS WO CONTRAST Result Date: 10/05/2023 CLINICAL DATA:  Respiratory illness. Bowel obstruction. 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 EXAM: CT  CHEST, ABDOMEN AND PELVIS WITHOUT CONTRAST TECHNIQUE: Multidetector CT imaging of the chest, abdomen and pelvis was performed following the standard protocol without IV contrast. RADIATION DOSE REDUCTION: This exam was performed according to the departmental dose-optimization program which includes automated exposure control, adjustment of the mA and/or kV according to patient size and/or use of iterative reconstruction technique. COMPARISON:  Abdomen pelvis CT 10/01/2023.  Chest CT 04/24/2012 FINDINGS: CT CHEST FINDINGS Cardiovascular: The heart size is normal. No substantial pericardial effusion. Mild atherosclerotic calcification is noted in the wall of the thoracic aorta. Mediastinum/Nodes: No mediastinal lymphadenopathy. NG tube visualized in the esophagus. Mild circumferential wall thickening noted distal esophagus just proximal to a moderate hiatal hernia. No evidence for gross hilar lymphadenopathy although assessment is limited by the lack of intravenous contrast on the current study. The esophagus has normal imaging features. Lungs/Pleura: 7 mm posterior right upper lobe nodule on 51/6 is new in the long interval since previous chest CT. Small airway impaction with tree-in-bud nodularity and patchy airspace disease noted in the posterior left upper lobe/lingula and dependent left lower lobe. Bronchiectasis with volume loss and consolidative opacity in the right middle lobe is progressive in the long interval since the prior study. There is some airway impaction and subtle clustered nodularity in the inferior right upper lobe. Patchy ill-defined nodular airspace opacity in the right lower lobe is associated with dependent collapse/consolidation. Small bilateral pleural effusions. Musculoskeletal: No worrisome lytic or sclerotic osseous abnormality. CT ABDOMEN PELVIS FINDINGS Hepatobiliary: Scattered tiny hypodensities in the liver parenchyma are too small to  characterize but are statistically most likely  benign. No followup imaging is recommended. High attenuation material in the lumen of the gallbladder is compatible with vicarious excretion of intravenous contrast material. No intrahepatic or extrahepatic biliary dilation. Pancreas: No focal mass lesion. No dilatation of the main duct. No intraparenchymal cyst. No peripancreatic edema. Spleen: No splenomegaly. No suspicious focal mass lesion. Adrenals/Urinary Tract: No adrenal nodule or mass. Scattered punctate calcification noted in the parenchyma of the right kidney. Cortical and central sinus cysts again noted left kidney. No evidence for hydroureter. The urinary bladder appears normal for the degree of distention. Stomach/Bowel: Stomach is decompressed spine in G-tube. NG tube tip is in the distal stomach. Duodenum is normally positioned as is the ligament of Treitz. Proximal jejunal loops are nondilated. Small bowel loops remain dilated in the abdomen and pelvis measuring up to 5.4 cm diameter. Proximal and distal small bowel is un opacified with some dilute contrast identified in central dilated small bowel of the abdomen and pelvis which includes the small bowel anastomosis. There is an abrupt transition zone of dilated opacified small bowel in the right pelvis with a classic beak appearance (see axial 99/2). Immediately adjacent to this is a abrupt transition of un opacified small bowel immediately posterior to the transition in the opacified bowel (also image 99/2). A third area of transition is identified in the central pelvis involving another opacified small bowel loop adjacent to the anastomosis (best seen on coronal 44/4. Multiple adhesions and apparent mesenteric tethering or seen in the central pelvis involving these areas of transition. In area of apparent mesenteric compression is identified in the right pelvis on image 93/2 raising the question of internal hernia. There are completely decompressed small bowel loops in the right lower quadrant  there appears to be a staple line in the right colon although ileocecal anatomy is not well demonstrated on this study. Colon is diffusely decompressed. No definite contrast material within the colonic lumen. Vascular/Lymphatic: There is mild atherosclerotic calcification of the abdominal aorta without aneurysm. There is no gastrohepatic or hepatoduodenal ligament lymphadenopathy. No retroperitoneal or mesenteric lymphadenopathy. No pelvic sidewall lymphadenopathy. Reproductive: There is no adnexal mass. Other: Scattered areas of interloop mesenteric fluid noted with small volume free fluid identified in the pelvis. Musculoskeletal: No worrisome lytic or sclerotic osseous abnormality. IMPRESSION: 1. Small bowel loops remain dilated in the abdomen and pelvis measuring up to 5.4 cm diameter. There is some diluted contrast in the dilated small bowel loops, but no discernible contrast in the colonic lumen. The degree of small-bowel dilatation appears stable to mildly progressive in the interval. There there are multiple relatively abrupt transition zones from dilated to nondilated small bowel in the central pelvis. Multiple adhesions and apparent mesenteric tethering or seen in the central pelvis involving these areas of transition. Appearance suggest mechanical obstruction due to multiple adhesions although an area of apparent mesenteric compression is identified in the right pelvis raising the question of internal hernia. Given the proximal jejunum and distal small bowel in the right lower quadrant are decompressed, closed loop obstruction also a consideration. No small bowel wall thickening. No evidence for pneumatosis. Multiple areas of interloop mesenteric fluid evident. 2. Small bilateral pleural effusions with dependent collapse/consolidation in the right lower lobe. 3. Small airway impaction with tree-in-bud nodularity and patchy airspace disease in the posterior left upper lobe/lingula and dependent left lower  lobe. Imaging features are compatible with an infectious/inflammatory etiology. 4. Bronchiectasis with volume loss and consolidative opacity in the right  middle lobe is progressive in the long interval since the prior study. This probably reflects chronic atelectasis/scarring. 5. 7 mm posterior right upper lobe pulmonary nodule is new in the long interval since previous chest CT. Non-contrast chest CT at 6-12 months is recommended. If the nodule is stable at time of repeat CT, then future CT at 18-24 months (from today's scan) is considered optional for low-risk patients, but is recommended for high-risk patients. This recommendation follows the consensus statement: Guidelines for Management of Incidental Pulmonary Nodules Detected on CT Images: From the Fleischner Society 2017; Radiology 2017; 284:228-243. 6. Mild circumferential wall thickening in the distal esophagus just proximal to a moderate hiatal hernia. Esophagitis would be a consideration. 7.  Aortic Atherosclerosis (ICD10-I70.0). Electronically Signed   By: Camellia Candle M.D.   On: 10/05/2023 05:56   ECHOCARDIOGRAM COMPLETE Result Date: 10/04/2023    ECHOCARDIOGRAM REPORT   Patient Name:   OLIVIAH AGOSTINI Bergeman Date of Exam: 10/04/2023 Medical Rec #:  [id number]       Height:       62.0 in Accession #:    [id number]      Weight:       110.0 lb Date of Birth:  [birth year]       BSA:          1.483 m Patient Age:    82 years        BP:           93/79 mmHg Patient Gender: F               HR:           112 bpm. Exam Location:  Inpatient Procedure: 2D Echo (Both Spectral and Color Flow Doppler were utilized during            procedure). Indications:    Afib  History:        Patient has no prior history of Echocardiogram examinations.                 Signs/Symptoms:Dyspnea.  Sonographer:    Norleen Amour Referring Phys: [id number] SYLVESTER I OGBATA IMPRESSIONS  1. Left ventricular ejection fraction, by estimation, is 60 to 65%. Left ventricular ejection fraction by 2D  MOD biplane is 60.0 %. The left ventricle has normal function. The left ventricle has no regional wall motion abnormalities. There is mild left ventricular hypertrophy. Left ventricular diastolic function could not be evaluated.  2. Right ventricular systolic function is normal. The right ventricular size is normal. There is normal pulmonary artery systolic pressure. The estimated right ventricular systolic pressure is 26.6 mmHg.  3. The mitral valve is grossly normal. No evidence of mitral valve regurgitation.  4. The aortic valve is tricuspid. Aortic valve regurgitation is not visualized. Aortic valve sclerosis is present, with no evidence of aortic valve stenosis.  5. The inferior vena cava is normal in size with greater than 50% respiratory variability, suggesting right atrial pressure of 3 mmHg. Comparison(s): No prior Echocardiogram. FINDINGS  Left Ventricle: Left ventricular ejection fraction, by estimation, is 60 to 65%. Left ventricular ejection fraction by 2D MOD biplane is 60.0 %. The left ventricle has normal function. The left ventricle has no regional wall motion abnormalities. The left ventricular internal cavity size was normal in size. There is mild left ventricular hypertrophy. Left ventricular diastolic function could not be evaluated due to atrial fibrillation. Left ventricular diastolic function could not be evaluated. Right Ventricle: The right ventricular size is normal. No  increase in right ventricular wall thickness. Right ventricular systolic function is normal. There is normal pulmonary artery systolic pressure. The tricuspid regurgitant velocity is 2.43 m/s, and  with an assumed right atrial pressure of 3 mmHg, the estimated right ventricular systolic pressure is 26.6 mmHg. Left Atrium: Left atrial size was normal in size. Right Atrium: Right atrial size was normal in size. Pericardium: There is no evidence of pericardial effusion. Mitral Valve: The mitral valve is grossly normal. No  evidence of mitral valve regurgitation. Tricuspid Valve: The tricuspid valve is grossly normal. Tricuspid valve regurgitation is mild. Aortic Valve: The aortic valve is tricuspid. Aortic valve regurgitation is not visualized. Aortic valve sclerosis is present, with no evidence of aortic valve stenosis. Pulmonic Valve: The pulmonic valve was normal in structure. Pulmonic valve regurgitation is not visualized. Aorta: The aortic root and ascending aorta are structurally normal, with no evidence of dilitation. Venous: The inferior vena cava is normal in size with greater than 50% respiratory variability, suggesting right atrial pressure of 3 mmHg. IAS/Shunts: The interatrial septum was not well visualized.  LEFT VENTRICLE PLAX 2D                        Biplane EF (MOD) LV PW:         1.05 cm         LV Biplane EF:   Left LV IVS:        1.05 cm                          ventricular LVOT diam:     1.70 cm                          ejection LV SV:         29                               fraction by LV SV Index:   19                               2D MOD LVOT Area:     2.27 cm                         biplane is                                                 60.0 %.  LV Volumes (MOD)               Diastology LV vol d, MOD    23.2 ml       LV e' medial:    7.51 cm/s A2C:                           LV E/e' medial:  11.7 LV vol d, MOD    44.2 ml       LV e' lateral:   8.23 cm/s A4C:  LV E/e' lateral: 10.7 LV vol s, MOD    12.8 ml A2C: LV vol s, MOD    12.4 ml A4C: LV SV MOD A2C:   10.4 ml LV SV MOD A4C:   44.2 ml LV SV MOD BP:    19.5 ml RIGHT VENTRICLE             IVC RV Basal diam:  2.50 cm     IVC diam: 0.90 cm RV S prime:     16.13 cm/s TAPSE (M-mode): 1.4 cm LEFT ATRIUM             Index        RIGHT ATRIUM           Index LA Vol (A2C):   45.1 ml 30.42 ml/m  RA Area:     11.30 cm LA Vol (A4C):   29.1 ml 19.62 ml/m  RA Volume:   24.50 ml  16.52 ml/m LA Biplane Vol: 36.8 ml 24.82 ml/m  AORTIC  VALVE LVOT Vmax:   91.53 cm/s LVOT Vmean:  67.300 cm/s LVOT VTI:    0.127 m  AORTA Ao Root diam: 2.90 cm Ao Asc diam:  3.00 cm MV E velocity: 87.70 cm/s  TRICUSPID VALVE                            TR Peak grad:   23.6 mmHg                            TR Vmax:        243.00 cm/s                             SHUNTS                            Systemic VTI:  0.13 m                            Systemic Diam: 1.70 cm Vinie Maxcy MD Electronically signed by Vinie Maxcy MD Signature Date/Time: 10/04/2023/3:54:43 PM    Final     Cardiac Studies:  Assessment/Plan:  Status post new onset A-fib with RVR CHA2DS2-VASc score of 3 Minimally elevated high-sensitivity troponin I secondary to demand ischemia doubt significant MI Resolving small bowel obstruction History of gastrointestinal stromal tumor resection in the past status post multiple laparotomies in the past Bipolar disorder History of Parkinson's disease Degenerative joint disease Anxiety disorder Hypernatremia  Plan Continue present management will start amiodarone  100 mg daily p.o. when able to take Continue heparin  for now Check EKG  LOS: 5 days    Levern Hutching 10/06/2023, 12:47 PM

## 2023-10-06 NOTE — Plan of Care (Signed)
 Patient is anxious and given PRN medication to address anxiety.   Problem: Clinical Measurements: Goal: Respiratory complications will improve Outcome: Not Progressing Goal: Cardiovascular complication will be avoided Outcome: Not Progressing   Problem: Activity: Goal: Risk for activity intolerance will decrease Outcome: Not Progressing   Problem: Coping: Goal: Level of anxiety will decrease Outcome: Not Progressing   Problem: Elimination: Goal: Will not experience complications related to bowel motility Outcome: Not Progressing

## 2023-10-06 NOTE — Progress Notes (Addendum)
 PHARMACY - ANTICOAGULATION CONSULT NOTE  Pharmacy Consult for heparin   Indication: atrial fibrillation  Allergies  Allergen Reactions   Lisinopril Cough   Penicillins Itching and Other (See Comments)    50 years ago   Adhesive [Tape] Itching   Atorvastatin Itching   Dilaudid  [Hydromorphone  Hcl] Itching    Patient Measurements: Height: 5' 2 (157.5 cm) Weight: 49 kg (108 lb 0.4 oz) IBW/kg (Calculated) : 50.1 HEPARIN  DW (KG): 49.9  Vital Signs: Temp: 98.4 F (36.9 C) (09/22 0500) Temp Source: Oral (09/21 1954) BP: 122/88 (09/22 0600) Pulse Rate: 98 (09/22 0600)  Labs: Recent Labs    10/04/23 0540 10/04/23 2335 10/05/23 0309 10/05/23 0816 10/06/23 0300  HGB 12.5  --  11.9*  --  12.6  HCT 42.1  --  39.4  --  40.5  PLT 310  --  314  --  365  HEPARINUNFRC  --  0.40  --  0.33 0.37  CREATININE 1.06*  --  0.86  --  0.88    Estimated Creatinine Clearance: 38.1 mL/min (by C-G formula based on SCr of 0.88 mg/dL).   Medical History: 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, possible    Tremors possible parkinsons   Manic disorder, single episode, in full remission (HCC) 12/01/2009   Sixth nerve palsy     Medications:  No prior to admission anticoagulation meds listed  Assessment: Pharmacy consulted to dose heparin  in 83 yo F admitted with a high grade pSBO.  Pharmacy consulted to dose Heparin  for new onset Afib with  RVR.  No prior to admission anticoagulation.    Today, 10/06/23 Heparin  level 0.37, remains therapeutic on heparin  750 units/hr CBC: Hgb and Plt WNL  No bleeding or other complications of therapy per nurse   Goal of Therapy:  Heparin  level 0.3-0.7 units/ml Monitor platelets by anticoagulation protocol: Yes   Plan:  Continue Heparin  drip at 750 units/hr Monitor daily heparin  level, CBC, signs/symptoms of bleeding Follow up procedural plans.     Thank you for allowing pharmacy to be a part of this patient's care.  Wanda Hasting PharmD, BCPS [facility] [phone] 10/06/2023 8:33 AM    Addendum: Plan to hold heparin  at 6am on 9/23 am Follow up surgical plans.  Wanda Hasting PharmD, BCPS [facility] [phone] 10/06/2023 12:14 PM

## 2023-10-06 NOTE — Progress Notes (Signed)
 PROGRESS NOTE    Tracey [name]  FMW:[id number] DOB: 1940/07/12 DOA: 10/01/2023 PCP: Mast, Man X, NP  Outpatient Specialists:     Brief Narrative:  Patient is an 83 year old female with medical history significant for cognitive impairment, bipolar disorder, possible Parkinsons disease and h/o rection of GIST tumor.  Patient presented with complaints of abdominal pain nausea vomiting and a lot of abdominal distention.  CT scan of abd/ pelvis revealed high-grade small bowel obstruction with transition point in the pelvis to the right of midline.  Mild mesenteric edema and small to moderate free fluid reported.  General surgery was consulted.  NG tube to low intermittent wall motion.  Patient is being managed conservatively.  Sodium of 153 noted today.  Hospital course has been complicated by atrial fibrillation with RVR, with drop in systolic blood pressure from the 140s to the 90s.  Cardiology team was consulted.  Patient is currently on heparin  drip and amiodarone  drip.  10/06/2023: Patient seen alongside patient's son Abigail) and nurse.  Vitals are stable, with heart rate of 60 bpm.  Will repeat EKG.  Sodium of 153 noted.  Patient reports increased thirst.  Will increase D5 water  from 75 cc/h to 100 cc/h.  BUN of 19 and serum creatinine of 0.88 with eGFR of greater than 60 mL/min.  Phosphorus done earlier today was 1.8, with potassium of 4.1.  Hemoglobin of 12.6 and hematocrit of 40.5.  Patient reports left ear pain.  Tenderness on pulling the left ear, otherwise, no significant finding on otoscopy.  Will start Ciprodex  otic 4 drops twice daily to the left ear for 5 days.  Serial BMP.  Faint but worse sounds heart for the first time today.  Input from surgery team is highly appreciated.  Assessment & Plan:   Principal Problem:   SBO (small bowel obstruction) (HCC) Active Problems:   Difficulty hearing   Small bowel obstruction: - See above documentation. - NG tube to low intermittent wall  suction.  Output is decreasing. - Supportive care. - Adequate hydration.  Dementia: - No behavioral problems.  Volume depletion/dehydration/hypernatremia: - Optimize volume resuscitation. - Continue IV D5 water  at 100 cc/h.  Hypertension: - Better controlled.  Hypophosphatemia: - IV K-Phos. - Phosphorus of 1.8.  Atrial fibrillation with RVR: - Heart rate is better controlled.  Heart rate of 60 bpm noted today.  Cardiology team is managing. - Get an EKG. - Blood pressure is better.  Left ear pain: - Concerns for possible otitis externa. - Ciprodex  otic 4 drops to left ear twice daily for 5 days.  Guarded long-term prognosis.   DVT prophylaxis: Start subcutaneous Lovenox  Code Status: DNR Family Communication:  Disposition Plan: This will depend on hospital course   Consultants:  Neurosurgery  Procedures:  NG tube to low intermittent wall suction  Antimicrobials:  Ciprodex  otic to the ear.   Subjective: Left ear pain.  Objective: Vitals:   10/06/23 0600 10/06/23 0700 10/06/23 0800 10/06/23 0900  BP: 122/88 128/62 (!) 145/71 (!) 146/73  Pulse: 98 62 80 (!) 58  Resp: 20 (!) 21 (!) 35 (!) 9  Temp:   97.8 F (36.6 C)   TempSrc:   Oral   SpO2: 95% 97% 96% 98%  Weight:      Height:        Intake/Output Summary (Last 24 hours) at 10/06/2023 0949 Last data filed at 10/06/2023 0942 Gross per 24 hour  Intake 3114.94 ml  Output 3090 ml  Net 24.94 ml  Filed Weights   10/02/23 1456 10/06/23 0500  Weight: 49.9 kg 49 kg    Examination:  General exam: Appears calm and comfortable  Respiratory system: Clear to auscultation.  Cardiovascular system: S1 & S2, irregularly irregular.  Gastrointestinal system: Abdomen is nondistended Central nervous system: Awake and alert.    Data Reviewed: I have personally reviewed following labs and imaging studies  CBC: Recent Labs  Lab 10/01/23 1922 10/02/23 0546 10/03/23 0443 10/04/23 0540 10/05/23 0309  10/06/23 0300  WBC 3.4* 2.6* 6.4 6.1 5.4 6.5  NEUTROABS 2.4  --  4.8 4.0 3.0  --   HGB 13.2 12.7 12.4 12.5 11.9* 12.6  HCT 41.4 39.7 39.6 42.1 39.4 40.5  MCV 101.0* 99.7 101.0* 104.7* 103.4* 104.4*  PLT 336 402* 308 310 314 365   Basic Metabolic Panel: Recent Labs  Lab 10/02/23 0546 10/03/23 0443 10/04/23 0540 10/05/23 0309 10/06/23 0300  NA 137 140 145 147* 153*  K 4.1 3.6 3.6 3.7 4.1  CL 99 98 104 110 118*  CO2 23 26 26 22 25   GLUCOSE 153* 133* 95 106* 151*  BUN 39* 52* 39* 28* 19  CREATININE 0.95 1.25* 1.06* 0.86 0.88  CALCIUM 10.6* 10.6* 10.5* 9.5 9.8  MG 2.1 2.6* 2.5* 2.6* 2.5*  PHOS  --  2.8 1.8* 2.2* 1.8*   GFR: Estimated Creatinine Clearance: 38.1 mL/min (by C-G formula based on SCr of 0.88 mg/dL). Liver Function Tests: Recent Labs  Lab 10/01/23 1922 10/03/23 0443 10/04/23 0540 10/05/23 0309 10/06/23 0300  AST 22 18  --   --   --   ALT 10 7  --   --   --   ALKPHOS 82 65  --   --   --   BILITOT 1.1 0.6  --   --   --   PROT 7.2 6.5  --   --   --   ALBUMIN  4.4 3.7 3.6 3.1* 3.1*   No results for input(s): LIPASE, AMYLASE in the last 168 hours. No results for input(s): AMMONIA in the last 168 hours. Coagulation Profile: No results for input(s): INR, PROTIME in the last 168 hours. Cardiac Enzymes: No results for input(s): CKTOTAL, CKMB, CKMBINDEX, TROPONINI in the last 168 hours. BNP (last 3 results) Recent Labs    10/04/23 1310  PROBNP 529.0*   HbA1C: No results for input(s): HGBA1C in the last 72 hours. CBG: No results for input(s): GLUCAP in the last 168 hours. Lipid Profile: No results for input(s): CHOL, HDL, LDLCALC, TRIG, CHOLHDL, LDLDIRECT in the last 72 hours. Thyroid  Function Tests: Recent Labs    10/05/23 1457  TSH 1.150   Anemia Panel: No results for input(s): VITAMINB12, FOLATE, FERRITIN, TIBC, IRON , RETICCTPCT in the last 72 hours. Urine analysis:    Component Value Date/Time    COLORURINE STRAW (A) 01/12/2020 2103   APPEARANCEUR CLEAR 01/12/2020 2103   LABSPEC 1.012 01/12/2020 2103   PHURINE 6.0 01/12/2020 2103   GLUCOSEU NEGATIVE 01/12/2020 2103   HGBUR NEGATIVE 01/12/2020 2103   BILIRUBINUR NEGATIVE 01/12/2020 2103   KETONESUR NEGATIVE 01/12/2020 2103   PROTEINUR NEGATIVE 01/12/2020 2103   NITRITE NEGATIVE 01/12/2020 2103   LEUKOCYTESUR NEGATIVE 01/12/2020 2103   Sepsis Labs: @LABRCNTIP (procalcitonin:4,lacticidven:4)  ) Recent Results (from the past 240 hours)  Culture, blood (Routine X 2) w Reflex to ID Panel     Status: None (Preliminary result)   Collection Time: 10/04/23  1:10 PM   Specimen: BLOOD LEFT ARM  Result Value Ref Range Status  Specimen Description   Final    BLOOD LEFT ARM Performed at [facility], [address] [place], [place] [address]    Special Requests   Final    BOTTLES DRAWN AEROBIC ONLY Blood Culture results may not be optimal due to an inadequate volume of blood received in culture bottles Performed at [facility], [address] [place], [place] [address]    Culture   Final    NO GROWTH 2 DAYS Performed at [facility], [address] [place], [place] [address]    Report Status PENDING  Incomplete  Culture, blood (Routine X 2) w Reflex to ID Panel     Status: None (Preliminary result)   Collection Time: 10/04/23  1:10 PM   Specimen: BLOOD LEFT HAND  Result Value Ref Range Status   Specimen Description   Final    BLOOD LEFT HAND Performed at [facility], [address], [place], [place] [address]    Special Requests   Final    BOTTLES DRAWN AEROBIC AND ANAEROBIC Blood Culture results may not be optimal due to an inadequate volume of blood received in culture bottles Performed at [facility], [address], [place], [place] [address]    Culture   Final    NO GROWTH 2 DAYS Performed at [id number], [address], [place], [place] [address]     Report Status PENDING  Incomplete  MRSA Next Gen by PCR, Nasal     Status: None   Collection Time: 10/04/23  2:48 PM   Specimen: Nasal Mucosa; Nasal Swab  Result Value Ref Range Status   MRSA by PCR Next Gen NOT DETECTED NOT DETECTED Final    Comment: (NOTE) The GeneXpert MRSA Assay (FDA approved for NASAL specimens only), is one component of a comprehensive MRSA colonization surveillance program. It is not intended to diagnose MRSA infection nor to guide or monitor treatment for MRSA infections. Test performance is not FDA approved in patients less than 60 years old. Performed at [facility], [address], [place], [place] [address]          Radiology Studies: CT CHEST ABDOMEN PELVIS WO CONTRAST Result Date: 10/05/2023 CLINICAL DATA:  Respiratory illness. Bowel obstruction. 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 EXAM: CT CHEST, ABDOMEN AND PELVIS WITHOUT CONTRAST TECHNIQUE: Multidetector CT imaging of the chest, abdomen and pelvis was performed following the standard protocol without IV contrast. RADIATION DOSE REDUCTION: This exam was performed according to the departmental dose-optimization program which includes automated exposure control, adjustment of the mA and/or kV according to patient size and/or use of iterative reconstruction technique. COMPARISON:  Abdomen pelvis CT 10/01/2023.  Chest CT 04/24/2012 FINDINGS: CT CHEST FINDINGS Cardiovascular: The heart size is normal. No substantial pericardial effusion. Mild atherosclerotic calcification is noted in the wall of the thoracic aorta. Mediastinum/Nodes: No mediastinal lymphadenopathy. NG tube visualized in the esophagus. Mild circumferential wall thickening noted distal esophagus just proximal to a moderate hiatal hernia. No evidence for gross hilar lymphadenopathy although assessment is limited by the lack of intravenous contrast on the current study. The esophagus has  normal imaging features. Lungs/Pleura: 7 mm posterior right upper lobe nodule on 51/6 is new in the long interval since previous chest CT. Small airway impaction with tree-in-bud nodularity and patchy airspace disease noted in the posterior left upper lobe/lingula and dependent left lower lobe. Bronchiectasis with volume loss  and consolidative opacity in the right middle lobe is progressive in the long interval since the prior study. There is some airway impaction and subtle clustered nodularity in the inferior right upper lobe. Patchy ill-defined nodular airspace opacity in the right lower lobe is associated with dependent collapse/consolidation. Small bilateral pleural effusions. Musculoskeletal: No worrisome lytic or sclerotic osseous abnormality. CT ABDOMEN PELVIS FINDINGS Hepatobiliary: Scattered tiny hypodensities in the liver parenchyma are too small to characterize but are statistically most likely benign. No followup imaging is recommended. High attenuation material in the lumen of the gallbladder is compatible with vicarious excretion of intravenous contrast material. No intrahepatic or extrahepatic biliary dilation. Pancreas: No focal mass lesion. No dilatation of the main duct. No intraparenchymal cyst. No peripancreatic edema. Spleen: No splenomegaly. No suspicious focal mass lesion. Adrenals/Urinary Tract: No adrenal nodule or mass. Scattered punctate calcification noted in the parenchyma of the right kidney. Cortical and central sinus cysts again noted left kidney. No evidence for hydroureter. The urinary bladder appears normal for the degree of distention. Stomach/Bowel: Stomach is decompressed spine in G-tube. NG tube tip is in the distal stomach. Duodenum is normally positioned as is the ligament of Treitz. Proximal jejunal loops are nondilated. Small bowel loops remain dilated in the abdomen and pelvis measuring up to 5.4 cm diameter. Proximal and distal small bowel is un opacified with some  dilute contrast identified in central dilated small bowel of the abdomen and pelvis which includes the small bowel anastomosis. There is an abrupt transition zone of dilated opacified small bowel in the right pelvis with a classic beak appearance (see axial 99/2). Immediately adjacent to this is a abrupt transition of un opacified small bowel immediately posterior to the transition in the opacified bowel (also image 99/2). A third area of transition is identified in the central pelvis involving another opacified small bowel loop adjacent to the anastomosis (best seen on coronal 44/4. Multiple adhesions and apparent mesenteric tethering or seen in the central pelvis involving these areas of transition. In area of apparent mesenteric compression is identified in the right pelvis on image 93/2 raising the question of internal hernia. There are completely decompressed small bowel loops in the right lower quadrant there appears to be a staple line in the right colon although ileocecal anatomy is not well demonstrated on this study. Colon is diffusely decompressed. No definite contrast material within the colonic lumen. Vascular/Lymphatic: There is mild atherosclerotic calcification of the abdominal aorta without aneurysm. There is no gastrohepatic or hepatoduodenal ligament lymphadenopathy. No retroperitoneal or mesenteric lymphadenopathy. No pelvic sidewall lymphadenopathy. Reproductive: There is no adnexal mass. Other: Scattered areas of interloop mesenteric fluid noted with small volume free fluid identified in the pelvis. Musculoskeletal: No worrisome lytic or sclerotic osseous abnormality. IMPRESSION: 1. Small bowel loops remain dilated in the abdomen and pelvis measuring up to 5.4 cm diameter. There is some diluted contrast in the dilated small bowel loops, but no discernible contrast in the colonic lumen. The degree of small-bowel dilatation appears stable to mildly progressive in the interval. There there are  multiple relatively abrupt transition zones from dilated to nondilated small bowel in the central pelvis. Multiple adhesions and apparent mesenteric tethering or seen in the central pelvis involving these areas of transition. Appearance suggest mechanical obstruction due to multiple adhesions although an area of apparent mesenteric compression is identified in the right pelvis raising the question of internal hernia. Given the proximal jejunum and distal small bowel in the right lower quadrant are decompressed, closed  loop obstruction also a consideration. No small bowel wall thickening. No evidence for pneumatosis. Multiple areas of interloop mesenteric fluid evident. 2. Small bilateral pleural effusions with dependent collapse/consolidation in the right lower lobe. 3. Small airway impaction with tree-in-bud nodularity and patchy airspace disease in the posterior left upper lobe/lingula and dependent left lower lobe. Imaging features are compatible with an infectious/inflammatory etiology. 4. Bronchiectasis with volume loss and consolidative opacity in the right middle lobe is progressive in the long interval since the prior study. This probably reflects chronic atelectasis/scarring. 5. 7 mm posterior right upper lobe pulmonary nodule is new in the long interval since previous chest CT. Non-contrast chest CT at 6-12 months is recommended. If the nodule is stable at time of repeat CT, then future CT at 18-24 months (from today's scan) is considered optional for low-risk patients, but is recommended for high-risk patients. This recommendation follows the consensus statement: Guidelines for Management of Incidental Pulmonary Nodules Detected on CT Images: From the Fleischner Society 2017; Radiology 2017; 284:228-243. 6. Mild circumferential wall thickening in the distal esophagus just proximal to a moderate hiatal hernia. Esophagitis would be a consideration. 7.  Aortic Atherosclerosis (ICD10-I70.0). Electronically  Signed   By: Camellia Candle M.D.   On: 10/05/2023 05:56   ECHOCARDIOGRAM COMPLETE Result Date: 10/04/2023    ECHOCARDIOGRAM REPORT   Patient Name:   RICHETTA CUBILLOS Kathol Date of Exam: 10/04/2023 Medical Rec #:  [id number]       Height:       62.0 in Accession #:    [id number]      Weight:       110.0 lb Date of Birth:  [birth year]       BSA:          1.483 m Patient Age:    82 years        BP:           93/79 mmHg Patient Gender: F               HR:           112 bpm. Exam Location:  Inpatient Procedure: 2D Echo (Both Spectral and Color Flow Doppler were utilized during            procedure). Indications:    Afib  History:        Patient has no prior history of Echocardiogram examinations.                 Signs/Symptoms:Dyspnea.  Sonographer:    Norleen Amour Referring Phys: 30 Harriet Sutphen I Madix Blowe IMPRESSIONS  1. Left ventricular ejection fraction, by estimation, is 60 to 65%. Left ventricular ejection fraction by 2D MOD biplane is 60.0 %. The left ventricle has normal function. The left ventricle has no regional wall motion abnormalities. There is mild left ventricular hypertrophy. Left ventricular diastolic function could not be evaluated.  2. Right ventricular systolic function is normal. The right ventricular size is normal. There is normal pulmonary artery systolic pressure. The estimated right ventricular systolic pressure is 26.6 mmHg.  3. The mitral valve is grossly normal. No evidence of mitral valve regurgitation.  4. The aortic valve is tricuspid. Aortic valve regurgitation is not visualized. Aortic valve sclerosis is present, with no evidence of aortic valve stenosis.  5. The inferior vena cava is normal in size with greater than 50% respiratory variability, suggesting right atrial pressure of 3 mmHg. Comparison(s): No prior Echocardiogram. FINDINGS  Left Ventricle: Left ventricular ejection fraction, by estimation,  is 60 to 65%. Left ventricular ejection fraction by 2D MOD biplane is 60.0 %. The left  ventricle has normal function. The left ventricle has no regional wall motion abnormalities. The left ventricular internal cavity size was normal in size. There is mild left ventricular hypertrophy. Left ventricular diastolic function could not be evaluated due to atrial fibrillation. Left ventricular diastolic function could not be evaluated. Right Ventricle: The right ventricular size is normal. No increase in right ventricular wall thickness. Right ventricular systolic function is normal. There is normal pulmonary artery systolic pressure. The tricuspid regurgitant velocity is 2.43 m/s, and  with an assumed right atrial pressure of 3 mmHg, the estimated right ventricular systolic pressure is 26.6 mmHg. Left Atrium: Left atrial size was normal in size. Right Atrium: Right atrial size was normal in size. Pericardium: There is no evidence of pericardial effusion. Mitral Valve: The mitral valve is grossly normal. No evidence of mitral valve regurgitation. Tricuspid Valve: The tricuspid valve is grossly normal. Tricuspid valve regurgitation is mild. Aortic Valve: The aortic valve is tricuspid. Aortic valve regurgitation is not visualized. Aortic valve sclerosis is present, with no evidence of aortic valve stenosis. Pulmonic Valve: The pulmonic valve was normal in structure. Pulmonic valve regurgitation is not visualized. Aorta: The aortic root and ascending aorta are structurally normal, with no evidence of dilitation. Venous: The inferior vena cava is normal in size with greater than 50% respiratory variability, suggesting right atrial pressure of 3 mmHg. IAS/Shunts: The interatrial septum was not well visualized.  LEFT VENTRICLE PLAX 2D                        Biplane EF (MOD) LV PW:         1.05 cm         LV Biplane EF:   Left LV IVS:        1.05 cm                          ventricular LVOT diam:     1.70 cm                          ejection LV SV:         29                               fraction by LV SV Index:    19                               2D MOD LVOT Area:     2.27 cm                         biplane is                                                 60.0 %.  LV Volumes (MOD)               Diastology LV vol d, MOD    23.2 ml       LV e' medial:    7.51 cm/s A2C:  LV E/e' medial:  11.7 LV vol d, MOD    44.2 ml       LV e' lateral:   8.23 cm/s A4C:                           LV E/e' lateral: 10.7 LV vol s, MOD    12.8 ml A2C: LV vol s, MOD    12.4 ml A4C: LV SV MOD A2C:   10.4 ml LV SV MOD A4C:   44.2 ml LV SV MOD BP:    19.5 ml RIGHT VENTRICLE             IVC RV Basal diam:  2.50 cm     IVC diam: 0.90 cm RV S prime:     16.13 cm/s TAPSE (M-mode): 1.4 cm LEFT ATRIUM             Index        RIGHT ATRIUM           Index LA Vol (A2C):   45.1 ml 30.42 ml/m  RA Area:     11.30 cm LA Vol (A4C):   29.1 ml 19.62 ml/m  RA Volume:   24.50 ml  16.52 ml/m LA Biplane Vol: 36.8 ml 24.82 ml/m  AORTIC VALVE LVOT Vmax:   91.53 cm/s LVOT Vmean:  67.300 cm/s LVOT VTI:    0.127 m  AORTA Ao Root diam: 2.90 cm Ao Asc diam:  3.00 cm MV E velocity: 87.70 cm/s  TRICUSPID VALVE                            TR Peak grad:   23.6 mmHg                            TR Vmax:        243.00 cm/s                             SHUNTS                            Systemic VTI:  0.13 m                            Systemic Diam: 1.70 cm Vinie Maxcy MD Electronically signed by Vinie Maxcy MD Signature Date/Time: 10/04/2023/3:54:43 PM    Final    DG CHEST PORT 1 VIEW Result Date: 10/04/2023 CLINICAL DATA:  NG tube placement. EXAM: PORTABLE CHEST 1 VIEW COMPARISON:  Chest x-ray and KUB 10/04/2023 FINDINGS: Patient is slightly rotated to the right. Interval placement of nasogastric tube with tip and side-port over the stomach in the left upper quadrant. Lungs are adequately inflated with stable minimal hazy bibasilar opacification likely atelectasis versus edema and less likely infection. Cardiomediastinal silhouette is normal. Dilated  air-filled small bowel loop over the upper abdomen unchanged. Remainder of the exam is unchanged. IMPRESSION: 1. Interval placement of nasogastric tube with tip and side-port over the stomach in the left upper quadrant. 2. Stable minimal hazy bibasilar opacification likely atelectasis versus edema and less likely infection. Electronically Signed   By: Toribio Agreste M.D.   On: 10/04/2023 12:20   DG Chest Port 1 View Result Date: 10/04/2023 CLINICAL DATA:  NG tube placement. EXAM: PORTABLE CHEST 1 VIEW COMPARISON:  08/06/2023 FINDINGS: The NG tube is looped back on itself in the hypopharynx and does not extend down the esophagus. No pulmonary edema or focal airspace consolidation. Minimal basilar atelectasis with no substantial pleural effusion. Cardiopericardial silhouette is at upper limits of normal for size. Telemetry leads overlie the chest. IMPRESSION: NG tube is looped back on itself in the hypopharynx and does not extend down the esophagus. Tube should be removed and replaced. Repeat imaging after repositioning recommended. These results will be called to the ordering clinician or representative by the Radiologist Assistant, and communication documented in the PACS or [employer]. Electronically Signed   By: Camellia Candle M.D.   On: 10/04/2023 11:18        Scheduled Meds:  Chlorhexidine  Gluconate Cloth  6 each Topical Daily   ciprofloxacin -dexamethasone   4 drop Left EAR BID   cycloSPORINE   1 drop Both Eyes BID   latanoprost   1 drop Both Eyes QHS   lidocaine   1 patch Transdermal Q24H   lithium  carbonate  150 mg Per Tube Q2000   timolol   1 drop Both Eyes Daily   topiramate   25 mg Oral BID   Continuous Infusions:  amiodarone  30 mg/hr (10/06/23 0942)   dextrose  100 mL/hr at 10/06/23 0943   heparin  750 Units/hr (10/06/23 0942)   potassium PHOSPHATE  IVPB (in mmol) 85 mL/hr at 10/06/23 0942     LOS: 5 days    Time spent: 55 minutes    Leatrice Chapel, MD  [facility] Pager #: [phone] 7PM-7AM contact night coverage as above

## 2023-10-07 ENCOUNTER — Inpatient Hospital Stay (HOSPITAL_COMMUNITY)

## 2023-10-07 ENCOUNTER — Inpatient Hospital Stay (HOSPITAL_COMMUNITY): Admitting: Anesthesiology

## 2023-10-07 ENCOUNTER — Encounter (HOSPITAL_COMMUNITY)
Admission: EM | Disposition: A | Discharge status: Skilled Nursing Facility | Payer: Self-pay | Source: Skilled Nursing Facility | Attending: Student

## 2023-10-07 ENCOUNTER — Other Ambulatory Visit: Payer: Self-pay

## 2023-10-07 ENCOUNTER — Encounter (HOSPITAL_COMMUNITY): Payer: Self-pay | Admitting: Internal Medicine

## 2023-10-07 DIAGNOSIS — I129 Hypertensive chronic kidney disease with stage 1 through stage 4 chronic kidney disease, or unspecified chronic kidney disease: Secondary | ICD-10-CM | POA: Diagnosis not present

## 2023-10-07 DIAGNOSIS — I4891 Unspecified atrial fibrillation: Secondary | ICD-10-CM | POA: Diagnosis not present

## 2023-10-07 DIAGNOSIS — K565 Intestinal adhesions [bands], unspecified as to partial versus complete obstruction: Secondary | ICD-10-CM | POA: Diagnosis not present

## 2023-10-07 DIAGNOSIS — N183 Chronic kidney disease, stage 3 unspecified: Secondary | ICD-10-CM | POA: Diagnosis not present

## 2023-10-07 DIAGNOSIS — K56609 Unspecified intestinal obstruction, unspecified as to partial versus complete obstruction: Secondary | ICD-10-CM | POA: Diagnosis not present

## 2023-10-07 DIAGNOSIS — E43 Unspecified severe protein-calorie malnutrition: Secondary | ICD-10-CM | POA: Insufficient documentation

## 2023-10-07 HISTORY — PX: LAPAROSCOPIC LYSIS OF ADHESIONS: SHX5905

## 2023-10-07 HISTORY — PX: LAPAROSCOPY: SHX197

## 2023-10-07 LAB — RENAL FUNCTION PANEL
Albumin: 2.9 g/dL — ABNORMAL LOW (ref 3.5–5.0)
Anion gap: 10 (ref 5–15)
BUN: 17 mg/dL (ref 8–23)
CO2: 23 mmol/L (ref 22–32)
Calcium: 9.5 mg/dL (ref 8.9–10.3)
Chloride: 112 mmol/L — ABNORMAL HIGH (ref 98–111)
Creatinine, Ser: 0.94 mg/dL (ref 0.44–1.00)
GFR, Estimated: >60 mL/min (ref >60)
Glucose, Bld: 135 mg/dL — ABNORMAL HIGH (ref 70–99)
Phosphorus: 2.3 mg/dL — ABNORMAL LOW (ref 2.5–4.6)
Potassium: 3.8 mmol/L (ref 3.5–5.1)
Sodium: 144 mmol/L (ref 135–145)

## 2023-10-07 LAB — BASIC METABOLIC PANEL WITH GFR
Anion gap: 10 (ref 5–15)
BUN: 17 mg/dL (ref 8–23)
CO2: 23 mmol/L (ref 22–32)
Calcium: 9.3 mg/dL (ref 8.9–10.3)
Chloride: 112 mmol/L — ABNORMAL HIGH (ref 98–111)
Creatinine, Ser: 0.94 mg/dL (ref 0.44–1.00)
GFR, Estimated: >60 mL/min (ref >60)
Glucose, Bld: 130 mg/dL — ABNORMAL HIGH (ref 70–99)
Potassium: 3.9 mmol/L (ref 3.5–5.1)
Sodium: 145 mmol/L (ref 135–145)

## 2023-10-07 LAB — CBC
HCT: 35.9 % — ABNORMAL LOW (ref 36.0–46.0)
Hemoglobin: 11.2 g/dL — ABNORMAL LOW (ref 12.0–15.0)
MCH: 32.5 pg (ref 26.0–34.0)
MCHC: 31.2 g/dL (ref 30.0–36.0)
MCV: 104.1 fL — ABNORMAL HIGH (ref 80.0–100.0)
Platelets: 327 K/uL (ref 150–400)
RBC: 3.45 MIL/uL — ABNORMAL LOW (ref 3.87–5.11)
RDW: 13.4 % (ref 11.5–15.5)
WBC: 7.3 K/uL (ref 4.0–10.5)
nRBC: 0 % (ref 0.0–0.2)

## 2023-10-07 LAB — HEPARIN LEVEL (UNFRACTIONATED): Heparin Unfractionated: 0.75 [IU]/mL — ABNORMAL HIGH (ref 0.30–0.70)

## 2023-10-07 LAB — PHOSPHORUS: Phosphorus: 2.4 mg/dL — ABNORMAL LOW (ref 2.5–4.6)

## 2023-10-07 LAB — MAGNESIUM: Magnesium: 2.2 mg/dL (ref 1.7–2.4)

## 2023-10-07 SURGERY — LAPAROSCOPY, DIAGNOSTIC
Anesthesia: General | Site: Abdomen

## 2023-10-07 MED ORDER — VASOPRESSIN 20 UNIT/ML IV SOLN
INTRAVENOUS | Status: AC
  Filled 2023-10-07: qty 1

## 2023-10-07 MED ORDER — FENTANYL CITRATE (PF) 100 MCG/2ML IJ SOLN
INTRAMUSCULAR | Status: AC
  Filled 2023-10-07: qty 2

## 2023-10-07 MED ORDER — HEPARIN (PORCINE) 25000 UT/250ML-% IV SOLN: 700.0000 [IU]/h | INTRAVENOUS | Status: DC

## 2023-10-07 MED ORDER — FENTANYL CITRATE PF 50 MCG/ML IJ SOSY
25.0000 ug | PREFILLED_SYRINGE | INTRAMUSCULAR | Status: DC | PRN
  Administered 2023-10-07 (×2): 25 ug via INTRAVENOUS

## 2023-10-07 MED ORDER — ALBUMIN HUMAN 5 % IV SOLN: INTRAVENOUS | Status: DC | PRN

## 2023-10-07 MED ORDER — CHLORHEXIDINE GLUCONATE 0.12 % MT SOLN
15.0000 mL | Freq: Once | OROMUCOSAL | Status: AC
  Administered 2023-10-07: 15 mL via OROMUCOSAL

## 2023-10-07 MED ORDER — PHENYLEPHRINE 80 MCG/ML (10ML) SYRINGE FOR IV PUSH (FOR BLOOD PRESSURE SUPPORT)
PREFILLED_SYRINGE | INTRAVENOUS | Status: DC | PRN
  Administered 2023-10-07: 80 ug via INTRAVENOUS
  Administered 2023-10-07: 160 ug via INTRAVENOUS
  Administered 2023-10-07 (×5): 80 ug via INTRAVENOUS

## 2023-10-07 MED ORDER — ACETAMINOPHEN 10 MG/ML IV SOLN
1000.0000 mg | Freq: Four times a day (QID) | INTRAVENOUS | Status: DC
  Administered 2023-10-07 – 2023-10-08 (×3): 1000 mg via INTRAVENOUS
  Filled 2023-10-07 (×3): qty 100

## 2023-10-07 MED ORDER — BUPIVACAINE-EPINEPHRINE (PF) 0.25% -1:200000 IJ SOLN
INTRAMUSCULAR | Status: AC
  Filled 2023-10-07: qty 30

## 2023-10-07 MED ORDER — POTASSIUM PHOSPHATES 15 MMOLE/5ML IV SOLN
30.0000 mmol | Freq: Once | INTRAVENOUS | Status: AC
  Administered 2023-10-07: 30 mmol via INTRAVENOUS
  Filled 2023-10-07: qty 10

## 2023-10-07 MED ORDER — SUCCINYLCHOLINE CHLORIDE 200 MG/10ML IV SOSY
PREFILLED_SYRINGE | INTRAVENOUS | Status: DC | PRN
  Administered 2023-10-07: 120 mg via INTRAVENOUS

## 2023-10-07 MED ORDER — FENTANYL CITRATE PF 50 MCG/ML IJ SOSY
PREFILLED_SYRINGE | INTRAMUSCULAR | Status: AC
  Filled 2023-10-07: qty 1

## 2023-10-07 MED ORDER — STERILE WATER FOR IRRIGATION IR SOLN
Status: DC | PRN
  Administered 2023-10-07: 1000 mL

## 2023-10-07 MED ORDER — PROPOFOL 10 MG/ML IV BOLUS
INTRAVENOUS | Status: DC | PRN
  Administered 2023-10-07: 70 mg via INTRAVENOUS

## 2023-10-07 MED ORDER — PHENYLEPHRINE HCL-NACL 20-0.9 MG/250ML-% IV SOLN
INTRAVENOUS | Status: DC | PRN
  Administered 2023-10-07: 45 ug/min via INTRAVENOUS

## 2023-10-07 MED ORDER — 0.9 % SODIUM CHLORIDE (POUR BTL) OPTIME
TOPICAL | Status: DC | PRN
  Administered 2023-10-07 (×2): 1000 mL
  Administered 2023-10-07: 2000 mL
  Administered 2023-10-07 (×3): 1000 mL

## 2023-10-07 MED ORDER — SUGAMMADEX SODIUM 200 MG/2ML IV SOLN
INTRAVENOUS | Status: AC
  Filled 2023-10-07: qty 2

## 2023-10-07 MED ORDER — MORPHINE SULFATE (PF) 2 MG/ML IV SOLN
2.0000 mg | INTRAVENOUS | Status: DC | PRN
  Administered 2023-10-07 – 2023-10-19 (×47): 2 mg via INTRAVENOUS
  Filled 2023-10-07 (×49): qty 1

## 2023-10-07 MED ORDER — BUPIVACAINE-EPINEPHRINE 0.25% -1:200000 IJ SOLN
INTRAMUSCULAR | Status: DC | PRN
  Administered 2023-10-07: 20 mL

## 2023-10-07 MED ORDER — ALBUMIN HUMAN 25 % IV SOLN
25.0000 g | Freq: Four times a day (QID) | INTRAVENOUS | Status: AC
Start: 2023-10-07 — End: 2023-10-09
  Administered 2023-10-07 – 2023-10-08 (×5): 25 g via INTRAVENOUS
  Filled 2023-10-07 (×5): qty 100

## 2023-10-07 MED ORDER — LACTATED RINGERS IV SOLN: INTRAVENOUS | Status: DC

## 2023-10-07 MED ORDER — DEXAMETHASONE SODIUM PHOSPHATE 10 MG/ML IJ SOLN
INTRAMUSCULAR | Status: DC | PRN
  Administered 2023-10-07: 6 mg via INTRAVENOUS

## 2023-10-07 MED ORDER — ONDANSETRON HCL 4 MG/2ML IJ SOLN
INTRAMUSCULAR | Status: DC | PRN
  Administered 2023-10-07: 4 mg via INTRAVENOUS

## 2023-10-07 MED ORDER — AMISULPRIDE (ANTIEMETIC) 5 MG/2ML IV SOLN: 10.0000 mg | Freq: Once | INTRAVENOUS | Status: DC | PRN

## 2023-10-07 MED ORDER — ROCURONIUM BROMIDE 10 MG/ML (PF) SYRINGE
PREFILLED_SYRINGE | INTRAVENOUS | Status: DC | PRN
  Administered 2023-10-07: 40 mg via INTRAVENOUS

## 2023-10-07 MED ORDER — VASOPRESSIN 20 UNIT/ML IV SOLN
INTRAVENOUS | Status: DC | PRN
  Administered 2023-10-07: 1 [IU] via INTRAVENOUS

## 2023-10-07 MED ORDER — PHENYLEPHRINE 80 MCG/ML (10ML) SYRINGE FOR IV PUSH (FOR BLOOD PRESSURE SUPPORT)
PREFILLED_SYRINGE | INTRAVENOUS | Status: AC
  Filled 2023-10-07: qty 20

## 2023-10-07 MED ORDER — OXYCODONE HCL 5 MG/5ML PO SOLN: 5.0000 mg | Freq: Once | ORAL | Status: DC | PRN

## 2023-10-07 MED ORDER — SUGAMMADEX SODIUM 200 MG/2ML IV SOLN
INTRAVENOUS | Status: DC | PRN
  Administered 2023-10-07: 150 mg via INTRAVENOUS

## 2023-10-07 MED ORDER — FENTANYL CITRATE (PF) 100 MCG/2ML IJ SOLN
INTRAMUSCULAR | Status: DC | PRN
  Administered 2023-10-07: 50 ug via INTRAVENOUS
  Administered 2023-10-07: 25 ug via INTRAVENOUS
  Administered 2023-10-07: 50 ug via INTRAVENOUS

## 2023-10-07 MED ORDER — FENTANYL CITRATE PF 50 MCG/ML IJ SOSY
50.0000 ug | PREFILLED_SYRINGE | Freq: Once | INTRAMUSCULAR | Status: AC
  Administered 2023-10-07: 50 ug via INTRAVENOUS
  Filled 2023-10-07: qty 1

## 2023-10-07 MED ORDER — ORAL CARE MOUTH RINSE: 15.0000 mL | Freq: Once | OROMUCOSAL | Status: AC

## 2023-10-07 MED ORDER — EPHEDRINE SULFATE-NACL 50-0.9 MG/10ML-% IV SOSY
PREFILLED_SYRINGE | INTRAVENOUS | Status: DC | PRN
  Administered 2023-10-07: 7.5 mg via INTRAVENOUS

## 2023-10-07 MED ORDER — OXYCODONE HCL 5 MG PO TABS: 5.0000 mg | ORAL_TABLET | Freq: Once | ORAL | Status: DC | PRN

## 2023-10-07 MED ORDER — LACTATED RINGERS IV BOLUS
1000.0000 mL | Freq: Once | INTRAVENOUS | Status: AC
Start: 2023-10-07 — End: 2023-10-07
  Administered 2023-10-07: 1000 mL via INTRAVENOUS

## 2023-10-07 MED ORDER — ALBUMIN HUMAN 5 % IV SOLN
INTRAVENOUS | Status: AC
Start: 2023-10-07 — End: 2023-10-07
  Filled 2023-10-07: qty 250

## 2023-10-07 MED ORDER — LACTATED RINGERS IV SOLN: INTRAVENOUS | Status: DC | PRN

## 2023-10-07 MED ORDER — CEFOTETAN DISODIUM 2 G IJ SOLR
2.0000 g | INTRAMUSCULAR | Status: AC
  Administered 2023-10-07: 2 g via INTRAVENOUS
  Filled 2023-10-07: qty 2

## 2023-10-07 MED ORDER — LIDOCAINE 2% (20 MG/ML) 5 ML SYRINGE
INTRAMUSCULAR | Status: DC | PRN
  Administered 2023-10-07: 40 mg via INTRAVENOUS

## 2023-10-07 MED ORDER — EPHEDRINE 5 MG/ML INJ
INTRAVENOUS | Status: AC
  Filled 2023-10-07: qty 5

## 2023-10-07 MED ORDER — ACETAMINOPHEN 10 MG/ML IV SOLN
1000.0000 mg | Freq: Four times a day (QID) | INTRAVENOUS | Status: DC
  Administered 2023-10-07: 1000 mg via INTRAVENOUS
  Filled 2023-10-07: qty 100

## 2023-10-07 SURGICAL SUPPLY — 59 items
BAG COUNTER SPONGE SURGICOUNT (BAG) ×3 IMPLANT
BLADE CLIPPER SURG (BLADE) IMPLANT
BLADE EXTENDED COATED 6.5IN (ELECTRODE) IMPLANT
BLADE SURG SZ10 CARB STEEL (BLADE) ×3 IMPLANT
CELLS DAT CNTRL 66122 CELL SVR (MISCELLANEOUS) IMPLANT
CHLORAPREP W/TINT 26 (MISCELLANEOUS) ×3 IMPLANT
CLIP APPLIE ROT 10 11.4 M/L (STAPLE) IMPLANT
COVER MAYO STAND STRL (DRAPES) IMPLANT
COVER SURGICAL LIGHT HANDLE (MISCELLANEOUS) ×3 IMPLANT
DERMABOND ADVANCED .7 DNX12 (GAUZE/BANDAGES/DRESSINGS) ×3 IMPLANT
DRAPE INCISE IOBAN 66X45 STRL (DRAPES) IMPLANT
DRAPE UTILITY XL STRL (DRAPES) IMPLANT
DRAPE WARM FLUID 44X44 (DRAPES) ×3 IMPLANT
DRSG OPSITE POSTOP 4X8 (GAUZE/BANDAGES/DRESSINGS) IMPLANT
DRSG TEGADERM 2-3/8X2-3/4 SM (GAUZE/BANDAGES/DRESSINGS) IMPLANT
ELECT REM PT RETURN 15FT ADLT (MISCELLANEOUS) ×3 IMPLANT
GAUZE SPONGE 2X2 8PLY STRL LF (GAUZE/BANDAGES/DRESSINGS) IMPLANT
GAUZE SPONGE 4X4 12PLY STRL (GAUZE/BANDAGES/DRESSINGS) IMPLANT
GLOVE BIOGEL PI IND STRL 6 (GLOVE) ×3 IMPLANT
GLOVE BIOGEL PI MICRO STRL 5.5 (GLOVE) ×3 IMPLANT
GOWN STRL REUS W/ TWL LRG LVL3 (GOWN DISPOSABLE) ×6 IMPLANT
HANDLE SUCTION POOLE (INSTRUMENTS) IMPLANT
IRRIGATION SUCT STRKRFLW 2 WTP (MISCELLANEOUS) IMPLANT
KIT BASIN OR (CUSTOM PROCEDURE TRAY) ×3 IMPLANT
KIT TURNOVER KIT A (KITS) ×3 IMPLANT
LHOOK LAP DISP 36CM (ELECTROSURGICAL) IMPLANT
LIGASURE IMPACT 36 18CM CVD LR (INSTRUMENTS) IMPLANT
NDL INSUFFLATION 14GA 120MM (NEEDLE) IMPLANT
NEEDLE INSUFFLATION 14GA 120MM (NEEDLE) ×2 IMPLANT
NS IRRIG 1000ML POUR BTL (IV SOLUTION) ×6 IMPLANT
PENCIL SMOKE EVACUATOR (MISCELLANEOUS) ×3 IMPLANT
RETRACTOR WND ALEXIS 18 MED (MISCELLANEOUS) IMPLANT
RETRACTOR WND ALEXIS 25 LRG (MISCELLANEOUS) IMPLANT
SCISSORS LAP 5X35 DISP (ENDOMECHANICALS) IMPLANT
SET TUBE SMOKE EVAC HIGH FLOW (TUBING) ×3 IMPLANT
SHEARS HARMONIC 36 ACE (MISCELLANEOUS) IMPLANT
SLEEVE ADV FIXATION 5X100MM (TROCAR) ×6 IMPLANT
SPIKE FLUID TRANSFER (MISCELLANEOUS) ×3 IMPLANT
SPONGE T-LAP 18X18 ~~LOC~~+RFID (SPONGE) IMPLANT
STAPLER SKIN PROX 35W (STAPLE) IMPLANT
SUT MNCRL AB 4-0 PS2 18 (SUTURE) ×3 IMPLANT
SUT PDS AB 0 CT 36 (SUTURE) IMPLANT
SUT PDS AB 0 CTX 36 PDP370T (SUTURE) IMPLANT
SUT PDS AB 1 CT 36 (SUTURE) IMPLANT
SUT PDS AB 1 TP1 96 (SUTURE) IMPLANT
SUT SILK 2 0 SH CR/8 (SUTURE) ×3 IMPLANT
SUT SILK 3 0 SH CR/8 (SUTURE) IMPLANT
SUT VIC AB 3-0 SH 27X BRD (SUTURE) IMPLANT
SYR BULB IRRIG 60ML STRL (SYRINGE) IMPLANT
SYSTEM LAPSCP GELPORT 120MM (MISCELLANEOUS) IMPLANT
TOWEL GREEN STERILE FF (TOWEL DISPOSABLE) ×6 IMPLANT
TRAY FOLEY MTR SLVR 14FR STAT (SET/KITS/TRAYS/PACK) ×3 IMPLANT
TRAY LAPAROSCOPIC (CUSTOM PROCEDURE TRAY) ×3 IMPLANT
TROCAR 11X100 Z THREAD (TROCAR) IMPLANT
TROCAR ADV FIXATION 12X100MM (TROCAR) IMPLANT
TROCAR BALLN 12MMX100 BLUNT (TROCAR) IMPLANT
TROCAR Z-THREAD FIOS 5X100MM (TROCAR) ×3 IMPLANT
WATER STERILE IRR 1000ML POUR (IV SOLUTION) ×3 IMPLANT
YANKAUER SUCT BULB TIP 10FT TU (MISCELLANEOUS) IMPLANT

## 2023-10-07 NOTE — Hospital Course (Addendum)
 83 year old woman PMH including bipolar disorder, cognitive impairment, presenting with abdominal pain, admitted for SBO, failed conservative management and underwent operative intervention 9/23, hospitalization complicated by pelvic abscess treated conservatively with antibiotics, and prolonged ileus.  Consultants General surgery Cardiology   Procedures/Events 9/17 admission for SBO 9/23 ex lap w/ LOA, repair of small bowel enterotomy 9/29 worsening leukocytosis, CT showed persistent SBO, small fluid collections, colitis.  Started on antibiotics.  Clinically felt to have ileus.

## 2023-10-07 NOTE — Anesthesia Procedure Notes (Signed)
 Procedure Name: Intubation Date/Time: 10/07/2023 11:20 AM  Performed by: Vincenzo Show, CRNAPre-anesthesia Checklist: Patient identified, Emergency Drugs available, Suction available, Patient being monitored and Timeout performed Patient Re-evaluated:Patient Re-evaluated prior to induction Oxygen Delivery Method: Circle system utilized Preoxygenation: Pre-oxygenation with 100% oxygen Induction Type: IV induction Ventilation: Mask ventilation without difficulty Laryngoscope Size: Mac and 3 Grade View: Grade I Tube type: Oral Tube size: 7.0 mm Number of attempts: 1 Airway Equipment and Method: Stylet Placement Confirmation: ETT inserted through vocal cords under direct vision, positive ETCO2, CO2 detector and breath sounds checked- equal and bilateral Secured at: 22 cm Tube secured with: Tape Dental Injury: Teeth and Oropharynx as per pre-operative assessment  Comments: ATOI

## 2023-10-07 NOTE — Transfer of Care (Signed)
 Immediate Anesthesia Transfer of Care Note  Patient: Tracey [name]  Procedure(s) Performed: Procedure(s): DIAGNOSTIC LAPAROSCOPY, EXPLORATORY LAPAROTOMY WITH LYSIS OF ADHESIONS, REPAIR OF SMALL BOWEL ENTEROTOMY (N/A) LYSIS, ADHESIONS, LAPAROSCOPIC (N/A)  Patient Location: PACU  Anesthesia Type:General  Level of Consciousness:  sedated, patient cooperative and responds to stimulation  Airway & Oxygen Therapy:Patient Spontanous Breathing and Patient connected to face mask oxgen  Post-op Assessment:  Report given to PACU RN and Post -op Vital signs reviewed and stable  Post vital signs:  Reviewed and stable  Last Vitals:  Vitals:   10/07/23 0939 10/07/23 1000  BP: 133/82   Pulse: 62   Resp: (!) 22   Temp: 36.5 C   SpO2:  95%    Complications: No apparent anesthesia complications

## 2023-10-07 NOTE — Op Note (Signed)
 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 She was admitted last week with a small bowel obstruction, and has had persistent small bowel distension on imaging with no return of bowel function after 6 days of NG decompression. After an extensive discussion of the risks and benefits of surgery with her and her family, abdominal exploration was recommended as she has failed to improve with nonoperative management. She and her family consented to proceed.  Findings: Small bowel obstruction due to an adhesive band between two loops of small bowel. Small pinpoint enterotomy (separate from the site of obstruction) was primarily repaired.  Procedure details: Informed consent was obtained in the preoperative area prior to the procedure. The patient was brought to the operating room and placed on the table in the supine position. General anesthesia was induced and appropriate lines and drains were placed for intraoperative monitoring. Perioperative antibiotics were administered per SCIP guidelines. The abdomen was prepped and draped in the usual sterile fashion. A pre-procedure timeout was taken verifying patient identity, surgical site and procedure to be performed.  A small skin incision was made in the left upper quadrant at Palmer's point, the fascia was grasped and elevated, and a Veress needle was inserted through the fascia.  Intraperitoneal  placement was confirmed with the saline drop test, the abdomen was insufflated, and a 5 mm Visiport was placed.  The peritoneal cavity was inspected with no evidence of visceral vascular injury.  There were multiple loops of dilated small bowel, with decompressed small bowel visible in the right lower quadrant.  Two additional 5 mm ports were placed in the left lateral abdomen and in the left lower quadrant under direct visualization.  The decompressed small bowel in the right lower quadrant was examined first.  An ileocolic anastomosis was identified in the right upper quadrant.  The small bowel was then run starting at the anastomosis and working proximally.  There was tethering of the small bowel in the pelvis, and proximal to this there were multiple loops of dilated small bowel.  The small bowel could not be brought up out of the pelvis on multiple attempts, even with repositioning the patient to Trendelenburg position.  The dilated small bowel significantly limited visualization of the point of obstruction.  Succus was then noted to be spilling from a small pinpoint enterotomy, presumably created from retraction of the distended bowel.  At this point I elected to convert to a laparotomy.  The ports were removed and the abdomen was desufflated.  A lower midline skin incision was created and the subcutaneous tissue was divided with cautery to expose the fascia.  The fascia was elevated and opened along the linea alba.  An Alexis wound protector was placed.  The majority of the small bowel was able to be eviscerated and was then run starting at the dilated proximal small bowel and working distally.  The small pinpoint enterotomy was on the mid small bowel on the mesenteric border.  The tissue appeared healthy and well-perfused.  The enterotomy was  closed using interrupted 2-0 Vicryl sutures.  The repair was then oversewn with a 2-0 silk Lembert suture.  There was no leakage after the repair.    There was a loop  of small bowel adherent in the pelvis, and these adhesions were sharply lysed.  This did not appear to be the point of obstruction.  More distally, there was a thick adhesive band between 2 loops of small bowel, and this adhesive band was causing twisting and obstruction of an additional loop of small bowel.  The band was sharply lysed, which released the underlying loop of obstructed bowel.  More distally, there was a loop of small bowel tethered to the right pelvic sidewall.  These adhesions were sharply lysed, which completely freed up the small bowel.  The small bowel was then run in its entirety starting at the ileocolic anastomosis and working proximally.  There was a small bowel anastomosis in the distal third of the small bowel.  This was not an obstructive point.  The small bowel was distended both proximal and distal to the anastomosis, and the bowel was slightly dusky at the anastomosis, however at this time the patient was hypotensive.  There were 2 small serosal tears on the small bowel which were oversewn with 2-0 silk Lembert sutures.  The patient was given albumin  and her blood pressure significantly improved.  The abdomen was copiously irrigated with warm saline and the bowel was placed back into the abdomen and allowed to sit for several minutes.  At this point the patient's blood pressure had normalized.  The entire small bowel was again run and no further injuries or serosal tears were identified.  The previously dusky small bowel at the prior anastomosis was now pink and well-perfused with no signs of ischemia.  The enterotomy repair was closely examined and was well-perfused with no leakage.  The bowel was placed back into the abdomen, taking care not to twist the mesentery.  The abdomen was again irrigated with warm saline and appeared hemostatic.  The wound protector was removed.  The fascia was closed with a running looped 1 PDS suture.  The skin was closed with staples.  The port sites were  closed with 4-0 Monocryl subcuticular suture.  Sterile dressings were applied.  All counts were correct x2 at the end of the procedure. The patient was extubated and taken to PACU in stable condition.  Leonor Dawn, MD 10/07/23 12:19 PM

## 2023-10-07 NOTE — Progress Notes (Addendum)
 Progress Note    Tracey [name]   FMW:[id number]  DOB: [birth year]  DOA: 10/01/2023     6 PCP: Mast, Man X, NP  Initial CC: abd pain  Hospital Course: [name] is an 83 yo female with PMH cognitive impairment, bipolar, possible Parkinson's, history of GIST who presented with abdominal pain and distension.  Initial CT A/P showed high-grade small bowel obstruction with transition point in the pelvis to the right of the midline and mild mesenteric edema.  She was treated supportively at first and NG tube was placed for decompression. Repeat CT was performed on 10/05/2023 showing ongoing dilated small bowel loops with no discernible contrast in the colon. She was eventually taken to the OR on 10/07/2023 for diagnostic laparoscopy with LOA and repair of prior small bowel enterotomy.   Assessment & Plan:  Small bowel obstruction - Initial CT A/P showed high-grade small bowel obstruction with transition point in the pelvis to the right of the midline and mild mesenteric edema.   - s/p NGT for decompression - repeat CT was performed on 10/05/2023 showing ongoing dilated small bowel loops with no discernible contrast in the colon. - now s/p OR on 10/07/2023 for diagnostic laparoscopy with LOA and repair of prior small bowel enterotomy - continue NPO - NGT in place until bowel function returns per surgery rec's - continue IVF  Hypotension - noted also during surgery - low MAP post op, now further downtrending again - giving LR bolus; resume albumin  q6h for now x 2 days - if refractory, may need short term pressors   Atrial fibrillation with RVR - Followed by cardiology as well prior to surgery - Now no longer on IV amiodarone  - Previously on heparin  drip also; currently on hold until 10/08/2023.  Not resuming until approved with general surgery in the morning   Severe protein calorie malnutrition - Patient's BMI is Body mass index is 20.24 kg/m.. - evidenced by severe fat depletion,  severe muscle depletion, percent weight loss (11% in 6 months) per RD - currently NPO - suspect will need TPN post op   Dementia - delirium precautions    Volume depletion/dehydration/hypernatremia - Optimize volume resuscitation - see hypotension   Hypertension - now hypotensive    Hypophosphatemia - replete as needed   Left ear pain: - Concerns for possible otitis externa - Ciprodex  otic 4 drops to left ear twice daily for 5 days  Interval History:  Post op hypotension; fluid responsive at this time. Starting albumin  back some for now too. Getting PICC in anticipation for TPN to start tomorrow.  Confused but comfortable when seen. Drowsy still.    Old records reviewed in assessment of this patient  Antimicrobials:   DVT prophylaxis:  SCDs Start: 10/01/23 2349   Code Status:   Code Status: Do not attempt resuscitation (DNR) PRE-ARREST INTERVENTIONS DESIRED  Mobility Assessment (Last 72 Hours)     Mobility Assessment     Row Name 10/07/23 0706 10/06/23 2000 10/06/23 1304 10/06/23 0900 10/05/23 2000   Does the patient have exclusion criteria? No - Perform mobility assessment No - Perform mobility assessment -- No - Perform mobility assessment Yes - Bedfast (Level 1) - Select exclusion criteria in next row   Mobility Assessment Exclusion Criteria No exclusion criteria present, perform mobility assessment No exclusion criteria present, perform mobility assessment -- No exclusion criteria present, perform mobility assessment Dysrhythmias requiring new antiarrhythmic medications within 12 hours   What is the highest level of mobility based on  the mobility assessment? Level 2 (Chairfast) - Balance while sitting on edge of bed and cannot stand Level 3 (Stands with assistance) - Balance while standing  and cannot march in place Level 3 (Stands with assistance) - Balance while standing  and cannot march in place Level 2 (Chairfast) - Balance while sitting on edge of bed and cannot  stand Level 1 (Bedfast) - Unable to balance while sitting on edge of bed   Is the above level different from baseline mobility prior to current illness? Yes - Recommend PT order Yes - Recommend PT order -- Yes - Recommend PT order Yes - Recommend PT order    Row Name 10/05/23 0800 10/04/23 2000         Does the patient have exclusion criteria? Yes - Bedfast (Level 1) - Select exclusion criteria in next row Yes - Bedfast (Level 1) - Select exclusion criteria in next row      Mobility Assessment Exclusion Criteria Dysrhythmias requiring new antiarrhythmic medications within 12 hours Dysrhythmias requiring new antiarrhythmic medications within 12 hours      What is the highest level of mobility based on the mobility assessment? Level 1 (Bedfast) - Unable to balance while sitting on edge of bed Level 1 (Bedfast) - Unable to balance while sitting on edge of bed      Is the above level different from baseline mobility prior to current illness? Yes - Recommend PT order Yes - Recommend PT order         Barriers to discharge:  Disposition Plan:  TBD HH orders placed: TBD Status is: Inpt  Objective: Blood pressure (!) 95/48, pulse 76, temperature (!) 97.4 F (36.3 C), temperature source Axillary, resp. rate (!) 22, height 5' 2 (1.575 m), weight 50.2 kg, SpO2 99%.  Examination:  Physical Exam Constitutional:      Appearance: Normal appearance.  HENT:     Head: Normocephalic and atraumatic.     Mouth/Throat:     Mouth: Mucous membranes are dry.  Eyes:     Extraocular Movements: Extraocular movements intact.  Cardiovascular:     Rate and Rhythm: Normal rate and regular rhythm.  Pulmonary:     Effort: Pulmonary effort is normal. No respiratory distress.     Breath sounds: Normal breath sounds.  Abdominal:     General: Bowel sounds are absent. There is no distension.  Musculoskeletal:        General: No swelling.     Cervical back: Normal range of motion.  Skin:    General: Skin is warm  and dry.  Neurological:     Mental Status: She is alert. She is disoriented.  Psychiatric:        Mood and Affect: Mood normal.      Consultants:  General surgery  Procedures:  10/07/23: Ex lap, LOA, repair of prior SB enterotomy   Data Reviewed: Results for orders placed or performed during the hospital encounter of 10/01/23 (from the past 24 hours)  Renal function panel     Status: Abnormal   Collection Time: 10/06/23 10:16 PM  Result Value Ref Range   Sodium 145 135 - 145 mmol/L   Potassium 3.8 3.5 - 5.1 mmol/L   Chloride 112 (H) 98 - 111 mmol/L   CO2 23 22 - 32 mmol/L   Glucose, Bld 128 (H) 70 - 99 mg/dL   BUN 18 8 - 23 mg/dL   Creatinine, Ser 9.05 0.44 - 1.00 mg/dL   Calcium 9.4 8.9 - 10.3  mg/dL   Phosphorus 2.5 2.5 - 4.6 mg/dL   Albumin  2.9 (L) 3.5 - 5.0 g/dL   GFR, Estimated >39 >39 mL/min   Anion gap 10 5 - 15  CBC     Status: Abnormal   Collection Time: 10/07/23  3:12 AM  Result Value Ref Range   WBC 7.3 4.0 - 10.5 K/uL   RBC 3.45 (L) 3.87 - 5.11 MIL/uL   Hemoglobin 11.2 (L) 12.0 - 15.0 g/dL   HCT 64.0 (L) 63.9 - 53.9 %   MCV 104.1 (H) 80.0 - 100.0 fL   MCH 32.5 26.0 - 34.0 pg   MCHC 31.2 30.0 - 36.0 g/dL   RDW 86.5 88.4 - 84.4 %   Platelets 327 150 - 400 K/uL   nRBC 0.0 0.0 - 0.2 %  Heparin  level (unfractionated)     Status: Abnormal   Collection Time: 10/07/23  3:12 AM  Result Value Ref Range   Heparin  Unfractionated 0.75 (H) 0.30 - 0.70 IU/mL  Basic metabolic panel with GFR     Status: Abnormal   Collection Time: 10/07/23  3:12 AM  Result Value Ref Range   Sodium 145 135 - 145 mmol/L   Potassium 3.9 3.5 - 5.1 mmol/L   Chloride 112 (H) 98 - 111 mmol/L   CO2 23 22 - 32 mmol/L   Glucose, Bld 130 (H) 70 - 99 mg/dL   BUN 17 8 - 23 mg/dL   Creatinine, Ser 9.05 0.44 - 1.00 mg/dL   Calcium 9.3 8.9 - 89.6 mg/dL   GFR, Estimated >39 >39 mL/min   Anion gap 10 5 - 15  Phosphorus     Status: Abnormal   Collection Time: 10/07/23  3:12 AM  Result Value Ref  Range   Phosphorus 2.4 (L) 2.5 - 4.6 mg/dL  Magnesium      Status: None   Collection Time: 10/07/23  3:12 AM  Result Value Ref Range   Magnesium  2.2 1.7 - 2.4 mg/dL  Renal function panel     Status: Abnormal   Collection Time: 10/07/23  3:13 AM  Result Value Ref Range   Sodium 144 135 - 145 mmol/L   Potassium 3.8 3.5 - 5.1 mmol/L   Chloride 112 (H) 98 - 111 mmol/L   CO2 23 22 - 32 mmol/L   Glucose, Bld 135 (H) 70 - 99 mg/dL   BUN 17 8 - 23 mg/dL   Creatinine, Ser 9.05 0.44 - 1.00 mg/dL   Calcium 9.5 8.9 - 89.6 mg/dL   Phosphorus 2.3 (L) 2.5 - 4.6 mg/dL   Albumin  2.9 (L) 3.5 - 5.0 g/dL   GFR, Estimated >39 >39 mL/min   Anion gap 10 5 - 15  Type and screen [facility]     Status: None   Collection Time: 10/07/23  9:56 AM  Result Value Ref Range   ABO/RH(D) O POS    Antibody Screen NEG    Sample Expiration      10/10/2023,2359 Performed at [facility], [address], [place], [place] [address]     I have reviewed pertinent nursing notes, vitals, labs, and images as necessary. I have ordered labwork to follow up on as indicated.  I have reviewed the last notes from staff over past 24 hours. I have discussed patient's care plan and test results with nursing staff, CM/SW, and other staff as appropriate.  Time spent: Greater than 50% of the 55 minute visit was spent in counseling/coordination of care for  the patient as laid out in the A&P.   LOS: 6 days   Alm Apo, MD [facility] 10/07/2023, 5:45 PM

## 2023-10-07 NOTE — Progress Notes (Signed)
 OT Cancellation Note  Patient Details Name: Tracey [name] MRN: [id number] DOB: 12/10/1940   Cancelled Treatment:    Reason Eval/Treat Not Completed: Patient at procedure or test/ unavailable  OT will contine to follow Acutely when medically ready.     Kayman Snuffer OT/L Acute Rehabilitation Department  [phone]  10/07/2023, 9:39 AM

## 2023-10-07 NOTE — Anesthesia Preprocedure Evaluation (Addendum)
 Anesthesia Evaluation  Patient identified by MRN, date of birth, ID band Patient awake    Reviewed: Allergy & Precautions, NPO status , Patient's Chart, lab work & pertinent test results  Airway Mallampati: III  TM Distance: >3 FB Neck ROM: Full    Dental  (+) Dental Advisory Given, Teeth Intact   Pulmonary asthma    Pulmonary exam normal breath sounds clear to auscultation       Cardiovascular hypertension, Pt. on medications Normal cardiovascular exam+ dysrhythmias Atrial Fibrillation  Rhythm:Regular Rate:Normal  TTE 2025 1. Left ventricular ejection fraction, by estimation, is 60 to 65%. Left  ventricular ejection fraction by 2D MOD biplane is 60.0 %. The left  ventricle has normal function. The left ventricle has no regional wall  motion abnormalities. There is mild left  ventricular hypertrophy. Left ventricular diastolic function could not be  evaluated.   2. Right ventricular systolic function is normal. The right ventricular  size is normal. There is normal pulmonary artery systolic pressure. The  estimated right ventricular systolic pressure is 26.6 mmHg.   3. The mitral valve is grossly normal. No evidence of mitral valve  regurgitation.   4. The aortic valve is tricuspid. Aortic valve regurgitation is not  visualized. Aortic valve sclerosis is present, with no evidence of aortic  valve stenosis.   5. The inferior vena cava is normal in size with greater than 50%  respiratory variability, suggesting right atrial pressure of 3 mmHg.     Neuro/Psych  Headaches PSYCHIATRIC DISORDERS Anxiety  Bipolar Disorder  Dementia    GI/Hepatic Neg liver ROS,GERD  ,,Colon CA   Endo/Other  negative endocrine ROS    Renal/GU Renal InsufficiencyRenal disease  negative genitourinary   Musculoskeletal  (+) Arthritis ,    Abdominal   Peds  Hematology  (+) Blood dyscrasia, anemia   Anesthesia Other Findings    Reproductive/Obstetrics                              Anesthesia Physical Anesthesia Plan  ASA: 3  Anesthesia Plan: General   Post-op Pain Management: Ofirmev  IV (intra-op)* and Dilaudid  IV   Induction: Intravenous and Rapid sequence  PONV Risk Score and Plan: 3 and Dexamethasone , Ondansetron  and Treatment may vary due to age or medical condition  Airway Management Planned: Oral ETT  Additional Equipment:   Intra-op Plan:   Post-operative Plan: Extubation in OR and Possible Post-op intubation/ventilation  Informed Consent: I have reviewed the patients History and Physical, chart, labs and discussed the procedure including the risks, benefits and alternatives for the proposed anesthesia with the patient or authorized representative who has indicated his/her understanding and acceptance.   Patient has DNR.  Discussed DNR with patient and Suspend DNR.   Dental advisory given  Plan Discussed with: CRNA  Anesthesia Plan Comments:          Anesthesia Quick Evaluation

## 2023-10-07 NOTE — Progress Notes (Addendum)
 TPN per Pharmacy: Pharmacy consulted to dose TPN in this 69 yoF with high-grade pSBO, now s/p Dx lap with LOA and SB repair  As consult entered after cutoff time for TPN submissions (12:30p); will order labs for tomorrow AM and start TPN tomorrow evening. No urgent electrolyte needs for today.   Heparin  per Pharmacy: Heparin  for new Afib currently on hold perioperatively. Per CCS, please resume heparin  tomorrow AM at 0700 (w/o bolus) - orders entered.  Bard Jeans, PharmD, BCPS [phone] 10/07/2023, 2:18 PM

## 2023-10-07 NOTE — Progress Notes (Signed)
 PHARMACY - ANTICOAGULATION CONSULT NOTE  Pharmacy Consult for heparin   Indication: atrial fibrillation  Allergies  Allergen Reactions   Lisinopril Cough   Penicillins Itching and Other (See Comments)    50 years ago   Adhesive [Tape] Itching   Atorvastatin Itching   Dilaudid  [Hydromorphone  Hcl] Itching    Patient Measurements: Height: 5' 2 (157.5 cm) Weight: 50.2 kg (110 lb 10.7 oz) IBW/kg (Calculated) : 50.1 HEPARIN  DW (KG): 49.9  Vital Signs: Temp: 98.1 F (36.7 C) (09/23 0400) Temp Source: Oral (09/23 0400) BP: 137/71 (09/23 0400) Pulse Rate: 73 (09/23 0400)  Labs: Recent Labs    10/05/23 0309 10/05/23 0816 10/06/23 0300 10/06/23 1050 10/06/23 2216 10/07/23 0312 10/07/23 0313  HGB 11.9*  --  12.6  --   --  11.2*  --   HCT 39.4  --  40.5  --   --  35.9*  --   PLT 314  --  365  --   --  327  --   HEPARINUNFRC  --  0.33 0.37  --   --  0.75*  --   CREATININE 0.86  --  0.88   < > 0.94 0.94 0.94   < > = values in this interval not displayed.    Estimated Creatinine Clearance: 36.5 mL/min (by C-G formula based on SCr of 0.94 mg/dL).   Medical History: 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, possible    Tremors possible parkinsons   Manic disorder, single episode, in full remission 12/01/2009   Sixth nerve palsy     Medications:  No prior to admission anticoagulation meds listed  Assessment: Pharmacy  consulted to dose heparin  in 83 yo F admitted with a high grade pSBO.  Pharmacy consulted to dose Heparin  for new onset Afib with RVR.  No prior to admission anticoagulation.    Today, 10/07/23 Heparin  level 0.75, now supra-therapeutic on heparin  750 units/hr >RN confirmed level was drawn from opposite arm as heparin  infusion CBC: Hgb slightly low/stable and Plt WNL  No bleeding or other complications of therapy per nurse  Plan to stop heparin  at 0600 for OR  Goal of Therapy:  Heparin  level 0.3-0.7 units/ml Monitor platelets by anticoagulation protocol: Yes   Plan:  Stop heparin  now for procedure Follow up anti-coagulation plans post-procedure     Thank you for allowing pharmacy to be a part of this patient's care.  Rosaline Millet, PharmD, BCPS 10/07/2023 4:28 AM

## 2023-10-07 NOTE — Progress Notes (Signed)
 RN notified to not use PICC until notified of proper PICC tip placement. RN VU. Powell Bowler, RN VAST

## 2023-10-07 NOTE — Progress Notes (Signed)
       Overnight   NAME: Tracey [name] MRN: [id number] DOB : Sep 11, 1940    Date of Service   10/07/2023   HPI/Events of Note    Notified by Radiology of malpositioned access device.  Imaging in part:   IMPRESSION: 1. Malpositioned right upper extremity distal PICC line is seen projecting cranially in the right neck. The distal tip is not included on the image. This is unchanged from prior. 2. Small bilateral pleural effusions and minimal bibasilar infiltrates.     Electronically Signed   By: Greig Pique M.D.   On: 10/07/2023 19:26   IV Team is aware and RNs have been advised to not use the device until remedied and rechecked/verified by repeat imaging    Interventions/ Plan   Do not use device until position remedied  IV Team is aware  RN is aware      Lynwood Kipper BSN MSNA MSN ACNPC-AG Acute Care Nurse Practitioner [facility]

## 2023-10-07 NOTE — Progress Notes (Signed)
 [facility] Progress Note     Subjective: No abdominal pain, but no return of bowel function with flatus or BM still.  Good rate control  Objective: Vital signs in last 24 hours: Temp:  [97.8 F (36.6 C)-98.9 F (37.2 C)] 98.1 F (36.7 C) (09/23 0400) Pulse Rate:  [48-73] 65 (09/23 0800) Resp:  [9-30] 23 (09/23 0800) BP: (104-164)/(33-84) 144/64 (09/23 0800) SpO2:  [93 %-100 %] 95 % (09/23 0800) Weight:  [50.2 kg] 50.2 kg (09/23 0400) Last BM Date : 09/29/23  Intake/Output from previous day: 09/22 0701 - 09/23 0700 In: 3227.4 [P.O.:100; I.V.:2536.6; NG/GT:60; IV Piggyback:530.8] Out: 2285 [Urine:1525; Emesis/NG output:760] Intake/Output this shift: Total I/O In: 133.3 [I.V.:133.3] Out: 300 [Urine:300]  PE: Gen:  Alert, NAD.  NGT in place with bilious output.   Abd: Soft, mild distention particularly of lower abdomen, NT   Lab Results:  Recent Labs    10/06/23 0300 10/07/23 0312  WBC 6.5 7.3  HGB 12.6 11.2*  HCT 40.5 35.9*  PLT 365 327   BMET Recent Labs    10/07/23 0312 10/07/23 0313  NA 145 144  K 3.9 3.8  CL 112* 112*  CO2 23 23  GLUCOSE 130* 135*  BUN 17 17  CREATININE 0.94 0.94  CALCIUM 9.3 9.5   PT/INR No results for input(s): LABPROT, INR in the last 72 hours. CMP     Component Value Date/Time   NA 144 10/07/2023 0313   NA 139 01/26/2020 0000   NA 141 04/24/2012 0929   K 3.8 10/07/2023 0313   K 4.4 04/24/2012 0929   CL 112 (H) 10/07/2023 0313   CL 109 (H) 04/24/2012 0929   CO2 23 10/07/2023 0313   CO2 23 04/24/2012 0929   GLUCOSE 135 (H) 10/07/2023 0313   GLUCOSE 85 04/24/2012 0929   BUN 17 10/07/2023 0313   BUN 19 01/26/2020 0000   BUN 20.6 04/24/2012 0929   CREATININE 0.94 10/07/2023 0313   CREATININE 0.79 04/18/2023 1524   CREATININE 0.9 04/24/2012 0929   CALCIUM 9.5 10/07/2023 0313   CALCIUM 10.5 (H) 01/12/2020 2123   CALCIUM 10.0 04/24/2012 0929   PROT 6.5 10/03/2023 0443   PROT 6.7 04/24/2012 0929    ALBUMIN  2.9 (L) 10/07/2023 0313   ALBUMIN  3.5 04/24/2012 0929   AST 18 10/03/2023 0443   AST 20 04/24/2012 0929   ALT 7 10/03/2023 0443   ALT 12 04/24/2012 0929   ALKPHOS 65 10/03/2023 0443   ALKPHOS 75 04/24/2012 0929   BILITOT 0.6 10/03/2023 0443   BILITOT 0.36 04/24/2012 0929   GFRNONAA >60 10/07/2023 0313   GFRNONAA 58 (L) 05/11/2020 0700   GFRAA 68 05/11/2020 0700   Lipase     Component Value Date/Time   LIPASE 22 09/18/2013 1810       Studies/Results: No results found.   Anti-infectives: Anti-infectives (From admission, onward)    Start     Dose/Rate Route Frequency Ordered Stop   10/07/23 0915  cefoTEtan  (CEFOTAN ) 2 g in sodium chloride  0.9 % 100 mL IVPB        2 g 200 mL/hr over 30 Minutes Intravenous On call to O.R. 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 Repeat CT  yesterday shows persistently dilated small bowel loops with no contrast in colon. - Patient does not have abdominal pain and exam is benign, however she still has not had return of bowel function. At this point favor operative intervention as she is unlikely to resolve the obstruction with medical management. I discussed this with patient this morning as well as her son who came into the room during my visit as well. -discussed the above and that failure to improve after at least 6 days already is unlikely to happen without surgical intervention -we discussed diagnostic laparoscopy, possible laparotomy, possible bowel resection, along with risks and complications including, but not limited to bleeding, infection, hernia, intra-abdominal abscess requiring perc drain, post op PNA, DVTs/PE, cardiac complications (MI, worsening of A fib,  etc).  The son and patient understand and are agreeable to proceed with surgery. -d/w primary service and sent a message to cardiology to notify them as well of decision.  FEN - NGT/NPO/IVFs per medicine/cards VTE - heparin  gtt held ID - cefotetan  on call to OR    Per TR[facility] --  Dementia  GERD Tremors  HTN  Anxiety  A fib RVR - on amio gtt, heparin  gtt held as of 0600am   LOS: 6 days   I reviewed nursing notes, hospitalist notes, last 24 h vitals and pain scores, last 48 h intake and output, last 24 h labs and trends, and last 24 h imaging results.  Burnard FORBES Banter, PA-C  [facility] 10/07/23 8:21 AM  Addendum 1:38pm: Confirmed with RN that patient has not had any bowel movements this morning. Spoke with patient's son Kyriaki Moder via phone. Discussed that she has persistent obstructive symptoms, and at this point is not very likely to resolve without surgery. Discussed a diagnostic laparoscopy, with possible laparotomy, as early as tomorrow if there is no return of bowel function by tomorrow morning. I asked if he and his mother would want her to have surgery if her obstruction does not improve. He leans toward proceeding with surgery but would like to think about it. Will plan for repeat KUB tomorrow morning. If no bowel function in the morning, tentatively plan for surgery pending follow up discussions with family.

## 2023-10-07 NOTE — Progress Notes (Signed)
 Peripherally Inserted Central Catheter Placement  The IV Nurse has discussed with the patient and/or persons authorized to consent for the patient, the purpose of this procedure and the potential benefits and risks involved with this procedure.  The benefits include less needle sticks, lab draws from the catheter, and the patient may be discharged home with the catheter. Risks include, but not limited to, infection, bleeding, blood clot (thrombus formation), and puncture of an artery; nerve damage and irregular heartbeat and possibility to perform a PICC exchange if needed/ordered by physician.  Alternatives to this procedure were also discussed.  Bard Power PICC patient education guide, fact sheet on infection prevention and patient information card has been provided to patient /or left at bedside.    PICC Placement Documentation  PICC Double Lumen 10/07/23 Right Brachial 37 cm 0 cm (Active)  Indication for Insertion or Continuance of Line Administration of hyperosmolar/irritating solutions (i.e. TPN, Vancomycin , etc.) 10/07/23 1821  Exposed Catheter (cm) 0 cm 10/07/23 1821  Site Assessment Clean, Dry, Intact 10/07/23 1821  Lumen #1 Status Flushed;Saline locked;Blood return noted 10/07/23 1821  Lumen #2 Status Flushed;Saline locked;Blood return noted 10/07/23 1821  Dressing Type Transparent;Securing device 10/07/23 1821  Dressing Status Antimicrobial disc/dressing in place 10/07/23 1821  Line Care Connections checked and tightened 10/07/23 1821  Line Adjustment (NICU/IV Team Only) No 10/07/23 1821  Dressing Intervention New dressing;Adhesive placed at insertion site (IV team only) 10/07/23 1821  Dressing Change Due 10/14/23 10/07/23 1821       Tracey [name] 10/07/2023, 6:22 PM

## 2023-10-07 NOTE — Anesthesia Postprocedure Evaluation (Signed)
 Anesthesia Post Note  Patient: Tracey [name]  Procedure(s) Performed: DIAGNOSTIC LAPAROSCOPY, EXPLORATORY LAPAROTOMY WITH LYSIS OF ADHESIONS, REPAIR OF SMALL BOWEL ENTEROTOMY (Abdomen) LYSIS, ADHESIONS, LAPAROSCOPIC     Patient location during evaluation: PACU Anesthesia Type: General Level of consciousness: awake and alert Pain management: pain level controlled Vital Signs Assessment: post-procedure vital signs reviewed and stable Respiratory status: spontaneous breathing, nonlabored ventilation and respiratory function stable Cardiovascular status: blood pressure returned to baseline and stable Postop Assessment: no apparent nausea or vomiting Anesthetic complications: no   No notable events documented.  Last Vitals:  Vitals:   10/07/23 1440 10/07/23 1500  BP: (!) 92/58 (!) 91/57  Pulse: 81 79  Resp: (!) 25 (!) 29  Temp:    SpO2: 97% 98%    Last Pain:  Vitals:   10/07/23 1424  TempSrc:   PainSc: 0-No pain                 Garnette FORBES Skillern

## 2023-10-07 NOTE — Plan of Care (Signed)
  Problem: Clinical Measurements: Goal: Respiratory complications will improve Outcome: Progressing Goal: Cardiovascular complication will be avoided Outcome: Progressing   Problem: Activity: Goal: Risk for activity intolerance will decrease Outcome: Not Progressing   Problem: Nutrition: Goal: Adequate nutrition will be maintained Outcome: Not Progressing   Problem: Coping: Goal: Level of anxiety will decrease Outcome: Not Progressing   Problem: Elimination: Goal: Will not experience complications related to bowel motility Outcome: Not Progressing Goal: Will not experience complications related to urinary retention Outcome: Not Progressing

## 2023-10-07 NOTE — Progress Notes (Signed)
 Subjective:  Patient underwent diagnostic laparoscopy and exploratory laparotomy with lysis of lesions and repair of small bowel enterotomy earlier today tolerated procedure well.  Patient remains in normal sinus rhythm.  Receiving IV fluid bolus for hypotension postoperatively.  Objective:  Vital Signs in the last 24 hours: Temp:  [97.7 F (36.5 C)-98.9 F (37.2 C)] 98.1 F (36.7 C) (09/23 1230) Pulse Rate:  [56-81] 76 (09/23 1645) Resp:  [14-49] 22 (09/23 1645) BP: (74-164)/(33-84) 86/47 (09/23 1645) SpO2:  [93 %-100 %] 99 % (09/23 1645) Weight:  [50.2 kg] 50.2 kg (09/23 0949)  Intake/Output from previous day: 09/22 0701 - 09/23 0700 In: 3227.4 [P.O.:100; I.V.:2536.6; NG/GT:60; IV Piggyback:530.8] Out: 2285 [Urine:1525; Emesis/NG output:760] Intake/Output from this shift: Total I/O In: 2371.3 [I.V.:1795.8; IV Piggyback:575.5] Out: 1395 [Urine:1145; Emesis/NG output:200; Blood:50]  Physical Exam: Unchanged PICC line being placed  Lab Results: Recent Labs    10/06/23 0300 10/07/23 0312  WBC 6.5 7.3  HGB 12.6 11.2*  PLT 365 327   Recent Labs    10/07/23 0312 10/07/23 0313  NA 145 144  K 3.9 3.8  CL 112* 112*  CO2 23 23  GLUCOSE 130* 135*  BUN 17 17  CREATININE 0.94 0.94   No results for input(s): TROPONINI in the last 72 hours.  Invalid input(s): CK, MB Hepatic Function Panel Recent Labs    10/07/23 0313  ALBUMIN  2.9*   No results for input(s): CHOL in the last 72 hours. No results for input(s): PROTIME in the last 72 hours.  Imaging: Imaging results have been reviewed and US  EKG SITE RITE Result Date: 10/07/2023 If Site Rite image not attached, placement could not be confirmed due to current cardiac rhythm.  DG Abd 1 View Result Date: 10/07/2023 CLINICAL DATA:  Follow-up small bowel obstruction. EXAM: ABDOMEN - 1 VIEW COMPARISON:  10/04/2023 and abdomen and pelvis CT dated 10/05/2023. FINDINGS: Again demonstrated are multiple dilated small  bowel loops. These currently measure up to 5.0 cm in maximum diameter, previously 5.9 cm on 10/04/2023. No significant colon gas. A nasogastric tube remains in place with its tip in the mid stomach and side hole in the proximal to mid stomach. Stable thoracolumbar scoliosis and degenerative changes. IMPRESSION: Persistent small bowel obstruction with mild improvement. Electronically Signed   By: Elspeth Bathe M.D.   On: 10/07/2023 09:59    Cardiac Studies:  Assessment/Plan:  Status post new onset A-fib with RVR CHA2DS2-VASc score of 3 Minimally elevated high-sensitivity troponin I secondary to demand ischemia doubt significant MI small bowel obstruction status post diagnostic laparoscopy followed by exploratory laparotomy and lysis of adhesions and repair of small bowel enterotomy doing well History of gastrointestinal stromal tumor resection in the past status post multiple laparotomies in the past Bipolar disorder History of Parkinson's disease Degenerative joint disease Anxiety disorder Plan Continue present management per surgery Start amiodarone  100 mg p.o. daily when able due to take by mouth Start heparin  when okay with surgery and switch to Eliquis p.o. No active cardiac issues I will sign off please call if needed Follow-up with me in 2 weeks upon discharge  LOS: 6 days    Levern Hutching 10/07/2023, 5:01 PM

## 2023-10-07 NOTE — Progress Notes (Signed)
 Nutrition Follow-up  DOCUMENTATION CODES:   Severe malnutrition in context of chronic illness  INTERVENTION:  - TPN to be started tomorrow evening.   - TPN management per pharmacy.   - Monitor magnesium , potassium, and phosphorus daily for at least 3 days, MD to replete as needed, as pt is at risk for refeeding syndrome given severe malnutrition with no intake x6 days. - Recommend 100mg  thiamine  x5 days.   - Daily weights while on TPN.  NUTRITION DIAGNOSIS:   Severe Malnutrition related to chronic illness as evidenced by severe fat depletion, severe muscle depletion, percent weight loss (11% in 6 months). *ongoing  GOAL:   Patient will meet greater than or equal to 90% of their needs *progressing, starting TPN  MONITOR:   Diet advancement, Labs, Weight trends, I & O's  REASON FOR ASSESSMENT:   NPO/Clear Liquid Diet (x5 days)    ASSESSMENT:   83 y.o. female with PMH significant for dementia, cognitive impairment, bipolar disorder, possible Parkinsons disease and h/o rection of GIST tumor who presented with complaints of abdominal pain nausea vomiting and a lot of abdominal distention. Admitted for small bowel obstruction.  9/17 Admit; NPO; NGT placed 9/23 s/p ex-lap with lysis of adhesions, repair of small bowel enterotomy   Patient in OR all morning.  Surgery wanting to start TPN. TPN consult placed after 12pm cut off so plan to start TPN tomorrow evening. Pharmacy on board.   Patient remains at a high risk for refeeding syndrome. Recommend close monitoring of electrolytes and thiamine  supplementation.   Admit weight: 110# Current weight: 110# I&O's: +3.4L since admit   Medications reviewed and include: -  Labs reviewed:  Phosphorus 2.3  Diet Order:   Diet Order             Diet NPO time specified Except for: Ice Chips  Diet effective now                   EDUCATION NEEDS:  No education needs have been identified at this time  Skin:  Skin  Assessment: Reviewed RN Assessment  Last BM:  PTA  Height:  Ht Readings from Last 1 Encounters:  10/07/23 5' 2 (1.575 m)   Weight:  Wt Readings from Last 1 Encounters:  10/07/23 50.2 kg    BMI:  Body mass index is 20.24 kg/m.  Estimated Nutritional Needs:  Kcal:  1500-1700 kcals Protein:  75-90 grams Fluid:  >/= 1.5L    Trude Ned RD, LDN Contact via Secure Chat.

## 2023-10-08 ENCOUNTER — Inpatient Hospital Stay (HOSPITAL_COMMUNITY)

## 2023-10-08 ENCOUNTER — Encounter (HOSPITAL_COMMUNITY): Payer: Self-pay | Admitting: Surgery

## 2023-10-08 DIAGNOSIS — R4189 Other symptoms and signs involving cognitive functions and awareness: Secondary | ICD-10-CM

## 2023-10-08 DIAGNOSIS — I10 Essential (primary) hypertension: Secondary | ICD-10-CM | POA: Diagnosis not present

## 2023-10-08 DIAGNOSIS — R918 Other nonspecific abnormal finding of lung field: Secondary | ICD-10-CM | POA: Diagnosis not present

## 2023-10-08 DIAGNOSIS — F0390 Unspecified dementia without behavioral disturbance: Secondary | ICD-10-CM

## 2023-10-08 DIAGNOSIS — I480 Paroxysmal atrial fibrillation: Secondary | ICD-10-CM | POA: Insufficient documentation

## 2023-10-08 DIAGNOSIS — I4891 Unspecified atrial fibrillation: Secondary | ICD-10-CM | POA: Diagnosis not present

## 2023-10-08 DIAGNOSIS — I959 Hypotension, unspecified: Secondary | ICD-10-CM | POA: Insufficient documentation

## 2023-10-08 DIAGNOSIS — K56609 Unspecified intestinal obstruction, unspecified as to partial versus complete obstruction: Secondary | ICD-10-CM | POA: Diagnosis not present

## 2023-10-08 DIAGNOSIS — H9202 Otalgia, left ear: Secondary | ICD-10-CM | POA: Insufficient documentation

## 2023-10-08 DIAGNOSIS — E43 Unspecified severe protein-calorie malnutrition: Secondary | ICD-10-CM

## 2023-10-08 DIAGNOSIS — Z4682 Encounter for fitting and adjustment of non-vascular catheter: Secondary | ICD-10-CM | POA: Diagnosis not present

## 2023-10-08 DIAGNOSIS — J90 Pleural effusion, not elsewhere classified: Secondary | ICD-10-CM | POA: Diagnosis not present

## 2023-10-08 DIAGNOSIS — Z452 Encounter for adjustment and management of vascular access device: Secondary | ICD-10-CM | POA: Diagnosis not present

## 2023-10-08 DIAGNOSIS — K219 Gastro-esophageal reflux disease without esophagitis: Secondary | ICD-10-CM

## 2023-10-08 LAB — COMPREHENSIVE METABOLIC PANEL WITH GFR
ALT: 8 U/L (ref 0–44)
AST: 17 U/L (ref 15–41)
Albumin: 3.5 g/dL (ref 3.5–5.0)
Alkaline Phosphatase: 36 U/L — ABNORMAL LOW (ref 38–126)
Anion gap: 11 (ref 5–15)
BUN: 16 mg/dL (ref 8–23)
CO2: 22 mmol/L (ref 22–32)
Calcium: 9.4 mg/dL (ref 8.9–10.3)
Chloride: 114 mmol/L — ABNORMAL HIGH (ref 98–111)
Creatinine, Ser: 1.11 mg/dL — ABNORMAL HIGH (ref 0.44–1.00)
GFR, Estimated: 49 mL/min — ABNORMAL LOW (ref >60)
Glucose, Bld: 105 mg/dL — ABNORMAL HIGH (ref 70–99)
Potassium: 4.2 mmol/L (ref 3.5–5.1)
Sodium: 146 mmol/L — ABNORMAL HIGH (ref 135–145)
Total Bilirubin: 0.4 mg/dL (ref 0.0–1.2)
Total Protein: 4.8 g/dL — ABNORMAL LOW (ref 6.5–8.1)

## 2023-10-08 LAB — URINALYSIS, COMPLETE (UACMP) WITH MICROSCOPIC
Bacteria, UA: NONE SEEN
Bilirubin Urine: NEGATIVE
Glucose, UA: NEGATIVE mg/dL
Hgb urine dipstick: NEGATIVE
Ketones, ur: NEGATIVE mg/dL
Leukocytes,Ua: NEGATIVE
Nitrite: NEGATIVE
Protein, ur: NEGATIVE mg/dL
Specific Gravity, Urine: 1.006 (ref 1.005–1.030)
pH: 8 (ref 5.0–8.0)

## 2023-10-08 LAB — CBC
HCT: 29.3 % — ABNORMAL LOW (ref 36.0–46.0)
Hemoglobin: 9.4 g/dL — ABNORMAL LOW (ref 12.0–15.0)
MCH: 33.2 pg (ref 26.0–34.0)
MCHC: 32.1 g/dL (ref 30.0–36.0)
MCV: 103.5 fL — ABNORMAL HIGH (ref 80.0–100.0)
Platelets: 232 K/uL (ref 150–400)
RBC: 2.83 MIL/uL — ABNORMAL LOW (ref 3.87–5.11)
RDW: 13.4 % (ref 11.5–15.5)
WBC: 22.2 K/uL — ABNORMAL HIGH (ref 4.0–10.5)
nRBC: 0 % (ref 0.0–0.2)

## 2023-10-08 LAB — GLUCOSE, CAPILLARY
Glucose-Capillary: 77 mg/dL (ref 70–99)
Glucose-Capillary: 94 mg/dL (ref 70–99)
Glucose-Capillary: 116 mg/dL — ABNORMAL HIGH (ref 70–99)

## 2023-10-08 LAB — PHOSPHORUS: Phosphorus: 4 mg/dL (ref 2.5–4.6)

## 2023-10-08 LAB — MAGNESIUM: Magnesium: 1.9 mg/dL (ref 1.7–2.4)

## 2023-10-08 MED ORDER — THIAMINE HCL 100 MG/ML IJ SOLN
100.0000 mg | INTRAMUSCULAR | Status: AC
  Administered 2023-10-09 – 2023-10-13 (×5): 100 mg via INTRAVENOUS
  Filled 2023-10-08 (×5): qty 2

## 2023-10-08 MED ORDER — THIAMINE HCL 100 MG/ML IJ SOLN
100.0000 mg | Freq: Once | INTRAMUSCULAR | Status: AC
  Administered 2023-10-08: 100 mg via INTRAVENOUS
  Filled 2023-10-08: qty 2

## 2023-10-08 MED ORDER — SODIUM CHLORIDE 0.9% FLUSH: 10.0000 mL | INTRAVENOUS | Status: DC | PRN

## 2023-10-08 MED ORDER — SODIUM CHLORIDE 0.45 % IV SOLN: INTRAVENOUS | Status: DC

## 2023-10-08 MED ORDER — TRAVASOL 10 % IV SOLN
INTRAVENOUS | Status: AC
  Filled 2023-10-08: qty 451.2

## 2023-10-08 MED ORDER — INSULIN ASPART 100 UNIT/ML IJ SOLN
0.0000 [IU] | INTRAMUSCULAR | Status: DC
  Administered 2023-10-09: 1 [IU] via SUBCUTANEOUS

## 2023-10-08 MED ORDER — HEPARIN (PORCINE) 25000 UT/250ML-% IV SOLN
1000.0000 [IU]/h | INTRAVENOUS | Status: DC
Start: 2023-10-08 — End: 2023-10-13
  Administered 2023-10-08 (×2): 700 [IU]/h via INTRAVENOUS
  Administered 2023-10-09: 900 [IU]/h via INTRAVENOUS
  Administered 2023-10-11 – 2023-10-12 (×2): 1000 [IU]/h via INTRAVENOUS
  Filled 2023-10-08 (×5): qty 250

## 2023-10-08 MED ORDER — METHOCARBAMOL 1000 MG/10ML IJ SOLN
500.0000 mg | Freq: Three times a day (TID) | INTRAMUSCULAR | Status: DC | PRN
  Administered 2023-10-08 – 2023-10-25 (×14): 500 mg via INTRAVENOUS
  Filled 2023-10-08 (×15): qty 10

## 2023-10-08 MED ORDER — SODIUM CHLORIDE 0.45 % IV SOLN
INTRAVENOUS | Status: DC
Start: 2023-10-08 — End: 2023-10-08

## 2023-10-08 MED ORDER — SODIUM CHLORIDE 0.9% FLUSH
10.0000 mL | Freq: Two times a day (BID) | INTRAVENOUS | Status: DC
  Administered 2023-10-08: 30 mL
  Administered 2023-10-08 – 2023-10-10 (×4): 10 mL
  Administered 2023-10-10: 30 mL
  Administered 2023-10-11 – 2023-10-12 (×4): 10 mL
  Administered 2023-10-13: 20 mL
  Administered 2023-10-13 – 2023-10-15 (×4): 10 mL
  Administered 2023-10-15: 40 mL
  Administered 2023-10-16 – 2023-10-18 (×4): 10 mL
  Administered 2023-10-19 – 2023-10-20 (×2): 20 mL
  Administered 2023-10-20: 10 mL
  Administered 2023-10-21: 20 mL
  Administered 2023-10-21: 10 mL
  Administered 2023-10-22: 40 mL
  Administered 2023-10-22 – 2023-10-27 (×9): 10 mL

## 2023-10-08 MED ORDER — ACETAMINOPHEN 10 MG/ML IV SOLN
1000.0000 mg | Freq: Four times a day (QID) | INTRAVENOUS | Status: AC
  Administered 2023-10-08 – 2023-10-09 (×3): 1000 mg via INTRAVENOUS
  Filled 2023-10-08 (×3): qty 100

## 2023-10-08 NOTE — Progress Notes (Signed)
 PHARMACY - TOTAL PARENTERAL NUTRITION CONSULT NOTE   Indication: Small bowel obstruction  Patient Measurements: Height: 5' 2 (157.5 cm) Weight: 57.5 kg (126 lb 12.2 oz) IBW/kg (Calculated) : 50.1 TPN AdjBW (KG): 50.2 Body mass index is 23.19 kg/m.  Assessment:  83 YO female presenting 9/17 with abdominal pain and distension. Initial CT A/P showed high-grade small bowel obstruction with transition point in the pelvis to the right of the midline and mild mesenteric edema, repeat CT continued to show ongoing dilated small bowel loops. Patient taken to OR 9/23 for diagnostic laparoscopy with LOA and repair of prior small bowel enterotomy. NGT in place. Pharmacy consulted for TPN management in the setting of SBO.   Glucose / Insulin :  CBGs 105-176 since 9/22 No insulin  given/ordered Electrolytes: Na 146 (slightly elevated), all others WNL Renal: Scr 1.11 (slightly elevated), BUN WNL Hepatic: All WNL Intake / Output; MIVF:  UOP: -2.1 L/24hrs  NG output: -900 mL/24hrs 1/2 NS @100ml /hr per MD GI Imaging: - 9/17 a/p: High-grade small bowel obstruction with transition point in the pelvis to the right of midline - 9/19 DG abd: SBO pattern with no improvement since the prior CT - 9/21 CT chest/a/p: Small bowel loops remain dilated in the abdomen and pelvis. There is some diluted contrast in the dilated small bowel loops, but no discernible contrast in the colonic lumen. The degree of small-bowel dilatation appears stable to mildly progressive in the interval - 9/23 DG abd: persistent SBO with mild improvement GI Surgeries / Procedures:  - 9/23: Diagnostic laparoscopy, exploratory laparotomy with lysis of adhesions, primary repair of small bowel enterotomy  Central access: PICC obtained 9/23 TPN start date: 9/24  Nutritional Goals: Goal TPN rate is 70 mL/hr (provides 79 g of protein and 1608 kcals per day)  RD Assessment: Estimated Needs Total Energy Estimated Needs: 1500-1700  kcals Total Protein Estimated Needs: 75-90 grams Total Fluid Estimated Needs: >/= 1.5L  Current Nutrition:  NPO  Plan:  At 1800: Start TPN at 40mL/hr (will provide 45g of protein per day and 920 total Kcal per day) Electrolytes in TPN:  Na 34mEq/L K 50mEq/L Ca 5mEq/L Mg 74mEq/L Phos 15mmol/L Cl:Ac 1:2 Add standard MVI and trace elements to TPN Initiate Sensitive q4h SSI and adjust as needed  Reduce MIVF to 60 mL/hr at 1800, per MD Thiamine  100mg  IV daily x5 days, per RD Monitor TPN labs on Mon/Thurs. Daily CMP/Mg/Phos for at least three days as recommended by RD.    Lacinda Moats, PharmD Clinical Pharmacist  9/24/20258:00 AM

## 2023-10-08 NOTE — Progress Notes (Signed)
 PROGRESS NOTE    Tracey [name]  FMW:[id number] DOB: July 26, 1940 DOA: 10/01/2023 PCP: Mast, Man X, NP    Chief Complaint  Patient presents with   Constipation    Brief Narrative:  Tracey [name] is an 83 yo female with PMH cognitive impairment, bipolar, possible Parkinson's, history of GIST who presented with abdominal pain and distension.  Initial CT A/P showed high-grade small bowel obstruction with transition point in the pelvis to the right of the midline and mild mesenteric edema.  She was treated supportively at first and NG tube was placed for decompression. Repeat CT was performed on 10/05/2023 showing ongoing dilated small bowel loops with no discernible contrast in the colon. She was eventually taken to the OR on 10/07/2023 for diagnostic laparoscopy with LOA and repair of prior small bowel enterotomy.   Assessment & Plan:   Principal Problem:   SBO (small bowel obstruction) (HCC) Active Problems:   Difficulty hearing   Essential (primary) hypertension   GERD (gastroesophageal reflux disease)   Major neurocognitive disorder (HCC)   Protein-calorie malnutrition, severe   New onset a-fib (HCC)   Hypotension   Dementia without behavioral disturbance (HCC)   Hypophosphatemia   Left ear pain  #1 SBO -CT abdomen and pelvis done on admission consistent with high-grade small bowel obstruction with transition point in the pelvis to the right of the midline and mild mesenteric edema. - Status post NG tube placement for decompression. - Repeat CT scan done on 10/05/2023 with ongoing dilated small bowel loops with no discernible contrast in the colon. - Patient seen and consulted on by general surgery who followed the patient throughout the hospitalization. - Due to no improvement with conservative treatments patient was subsequently taken to the OR on 10/07/2023 for diagnostic laparoscopy with LOA and repair of prior small bowel enterotomy. - Currently NG tube in place until bowel  function returns per general surgery. - Patient to be started on TNA per general surgery today once PICC line has been exchanged. -Pulmonary toileting. - Per general surgery.  2.  Leukocytosis -With a leukocytosis noted on admission with white count of 22.2K from 7.3. - Per general surgery not unexpected postoperatively. - Check a chest x-ray - Check a UA with cultures and sensitivities. - Follow-up.  3.  Hypotension -Patient noted to be hypotensive during surgery. - Patient noted with a low MAP postoperatively which was downtrending and patient received a LR bolus - Continue IV albumin  x 2 days. - BP improved. - Gentle hydration. - Patient to be started on TPN, today.  4.  A-fib with RVR -Currently in normal sinus rhythm. - Patient was seen by cardiology prior to surgery. - Patient was on IV amiodarone  which has subsequently been discontinued. - Patient also placed on heparin  drip which was held and cleared by general surgery to resume today. - Per cardiology, once tolerating oral intake could start on amiodarone  100 mg daily and when okay with general surgery could potentially switch to Eliquis p.o. - Cardiology was following but signed off on 10/07/2023. - Outpatient follow-up with cardiology.  5.  Severe protein calorie malnutrition -BMI of 20.24 kg/m. - Percent weight loss 11% in 6 months per RD. - Patient currently n.p.o. secondary to problem #1 while awaiting bowel function to return. - Patient started on TPN per general surgery recommendations.  6.  Dementia -Stable. - Delirium precautions.  7.  Volume depletion/dehydration/hypernatremia -Patient noted to be hypotensive postoperatively currently on IV fluids and IV albumin . - Change IV  fluids to half-normal saline.  8. hypertension -Noted to be hypotensive. - Follow.  9.  Hypophosphatemia - Repleted. - Phosphorus at 4.0.  10.  Left ear pain -Concern for otitis externa. - Continue Ciprodex  otic 4 drops to  the left ear twice daily x 5 days.    DVT prophylaxis: Heparin  Code Status: DNR Family Communication: Updated patient.  No family at bedside. Disposition: Remain in stepdown unit  Status is: Inpatient Remains inpatient appropriate because: Severity of illness   Consultants:  General Surgery: Dr. Debby 10/01/2023 Cardiology: Dr. Levern 10/05/2023  Procedures:  PICC line placement 10/07/2023 PICC line exchange 10/08/2023 CT abdomen pelvis 10/01/2023 2D echo 10/04/2023 CT chest abdomen and pelvis 10/05/2023 Small bowel protocol 10/02/2023 Diagnostic laparoscopy, ex lap with LOA, primary repair of small bowel enterotomy per general surgery: Dr. Dasie 10/07/2023   Antimicrobials:  Anti-infectives (From admission, onward)    Start     Dose/Rate Route Frequency Ordered Stop   10/07/23 0915  cefoTEtan  (CEFOTAN ) 2 g in sodium chloride  0.9 % 100 mL IVPB        2 g 200 mL/hr over 30 Minutes Intravenous On call to O.R. 10/07/23 0820 10/07/23 1938         Subjective: Patient sitting up on bedside commode, therapy at bedside.  NG tube in place.  Patient denies any shortness of breath, no chest pain.  Some abdominal discomfort.  No BM.  Denies any dysuria or cough.  Objective: Vitals:   10/08/23 1400 10/08/23 1417 10/08/23 1500 10/08/23 1600  BP: (!) 135/48  (!) 130/42 (!) 129/38  Pulse: 68 67 65 66  Resp: (!) 23 (!) 25 (!) 26 (!) 24  Temp:    98.4 F (36.9 C)  TempSrc:    Oral  SpO2: 96% 95% 100% 90%  Weight:      Height:        Intake/Output Summary (Last 24 hours) at 10/08/2023 1724 Last data filed at 10/08/2023 1531 Gross per 24 hour  Intake 3633.36 ml  Output 2750 ml  Net 883.36 ml   Filed Weights   10/07/23 0400 10/07/23 0949 10/08/23 0500  Weight: 50.2 kg 50.2 kg 57.5 kg    Examination:  General exam: Appears calm and comfortable.  NG tube in place Respiratory system: Decreased breath sounds in the bases.  No significant wheezing noted.  Fair air movement.   Speaking in full sentences.   Cardiovascular system: S1 & S2 heard, RRR. No JVD, murmurs, rubs, gallops or clicks. No pedal edema. Gastrointestinal system: Abdomen is nondistended, soft and diffusely tender to palpation.  Honeycomb dressing intact with lower portion of honeycomb dressing with some sanguinous drainage.  Hypoactive bowel sounds.  No rebound.  No guarding.  Central nervous system: Alert and oriented. No focal neurological deficits. Extremities: Symmetric 5 x 5 power. Skin: No rashes, lesions or ulcers Psychiatry: Judgement and insight appear normal. Mood & affect appropriate.     Data Reviewed: I have personally reviewed following labs and imaging studies  CBC: Recent Labs  Lab 10/01/23 1922 10/02/23 0546 10/03/23 0443 10/04/23 0540 10/05/23 0309 10/06/23 0300 10/07/23 0312 10/08/23 0317  WBC 3.4*   < > 6.4 6.1 5.4 6.5 7.3 22.2*  NEUTROABS 2.4  --  4.8 4.0 3.0  --   --   --   HGB 13.2   < > 12.4 12.5 11.9* 12.6 11.2* 9.4*  HCT 41.4   < > 39.6 42.1 39.4 40.5 35.9* 29.3*  MCV 101.0*   < >  101.0* 104.7* 103.4* 104.4* 104.1* 103.5*  PLT 336   < > 308 310 314 365 327 232   < > = values in this interval not displayed.    Basic Metabolic Panel: Recent Labs  Lab 10/04/23 0540 10/05/23 0309 10/06/23 0300 10/06/23 1050 10/06/23 1717 10/06/23 2216 10/07/23 0312 10/07/23 0313 10/08/23 0317  NA 145 147* 153*   < > 147* 145 145 144 146*  K 3.6 3.7 4.1   < > 4.4 3.8 3.9 3.8 4.2  CL 104 110 118*   < > 113* 112* 112* 112* 114*  CO2 26 22 25    < > 24 23 23 23 22   GLUCOSE 95 106* 151*   < > 118* 128* 130* 135* 105*  BUN 39* 28* 19   < > 20 18 17 17 16   CREATININE 1.06* 0.86 0.88   < > 0.99 0.94 0.94 0.94 1.11*  CALCIUM 10.5* 9.5 9.8   < > 9.6 9.4 9.3 9.5 9.4  MG 2.5* 2.6* 2.5*  --   --   --  2.2  --  1.9  PHOS 1.8* 2.2* 1.8*   < > 2.7 2.5 2.4* 2.3* 4.0   < > = values in this interval not displayed.    GFR: Estimated Creatinine Clearance: 30.9 mL/min (A) (by C-G  formula based on SCr of 1.11 mg/dL (H)).  Liver Function Tests: Recent Labs  Lab 10/01/23 1922 10/03/23 0443 10/04/23 0540 10/06/23 1050 10/06/23 1717 10/06/23 2216 10/07/23 0313 10/08/23 0317  AST 22 18  --   --   --   --   --  17  ALT 10 7  --   --   --   --   --  8  ALKPHOS 82 65  --   --   --   --   --  36*  BILITOT 1.1 0.6  --   --   --   --   --  0.4  PROT 7.2 6.5  --   --   --   --   --  4.8*  ALBUMIN  4.4 3.7   < > 3.1* 3.1* 2.9* 2.9* 3.5   < > = values in this interval not displayed.    CBG: Recent Labs  Lab 10/08/23 1650  GLUCAP 77     Recent Results (from the past 240 hours)  Culture, blood (Routine X 2) w Reflex to ID Panel     Status: None (Preliminary result)   Collection Time: 10/04/23  1:10 PM   Specimen: BLOOD LEFT ARM  Result Value Ref Range Status   Specimen Description   Final    BLOOD LEFT ARM Performed at [facility], [address] [place], [place] [address]    Special Requests   Final    BOTTLES DRAWN AEROBIC ONLY Blood Culture results may not be optimal due to an inadequate volume of blood received in culture bottles Performed at [facility], [address], [place], [place] [address]    Culture   Final    NO GROWTH 4 DAYS Performed at [facility], [address] [place], [place] [address]    Report Status PENDING  Incomplete  Culture, blood (Routine X 2) w Reflex to ID Panel     Status: None (Preliminary result)   Collection Time: 10/04/23  1:10 PM   Specimen: BLOOD LEFT HAND  Result Value Ref Range Status   Specimen Description   Final  BLOOD LEFT HAND Performed at [facility], [address] [place], [place] [address]    Special Requests   Final    BOTTLES DRAWN AEROBIC AND ANAEROBIC Blood Culture results may not be optimal due to an inadequate volume of blood received in culture bottles Performed at [facility], [address] [place], [place] [address]    Culture    Final    NO GROWTH 4 DAYS Performed at [facility], [address], [place], [place] [address]    Report Status PENDING  Incomplete  MRSA Next Gen by PCR, Nasal     Status: None   Collection Time: 10/04/23  2:48 PM   Specimen: Nasal Mucosa; Nasal Swab  Result Value Ref Range Status   MRSA by PCR Next Gen NOT DETECTED NOT DETECTED Final    Comment: (NOTE) The GeneXpert MRSA Assay (FDA approved for NASAL specimens only), is one component of a comprehensive MRSA colonization surveillance program. It is not intended to diagnose MRSA infection nor to guide or monitor treatment for MRSA infections. Test performance is not FDA approved in patients less than 19 years old. Performed at [facility], [address], [place], [place] [address]          Radiology Studies: DG CHEST PORT 1 VIEW Result Date: 10/08/2023 EXAM: 1 VIEW(S) XRAY OF THE CHEST 10/08/2023 01:43:00 PM COMPARISON: 10/07/2023 CLINICAL HISTORY: Encounter for assessment of peripherally inserted central catheter (PICC) [id number]. Encounter for PICC line placement. FINDINGS: LINES, TUBES AND DEVICES: Right upper extremity PICC repositioned. The tip now overlies right atrium. Stable enteric tube. LUNGS AND PLEURA: Bilateral hazy opacities. Small bilateral pleural effusions with associated atelectasis. No pulmonary edema. No pneumothorax. HEART AND MEDIASTINUM: No acute abnormality of the cardiac and mediastinal silhouettes. BONES AND SOFT TISSUES: No acute osseous abnormality. IMPRESSION: 1. Right upper extremity PICC tip repositioned. The tip now overlies the right atrium. 2. Stable enteric tube. 3. Bilateral small pleural effusions with associated atelectasis. 4. Bilateral hazy pulmonary opacities likely reflect aggressive behavior edema Infection is not excluded. Electronically signed by: Lonni Necessary MD 10/08/2023 02:06 PM EDT RP Workstation: [id number]   DG Abd 1 View Result Date:  10/08/2023 CLINICAL DATA:  Nasogastric tube placement. EXAM: ABDOMEN - 1 VIEW COMPARISON:  10/07/2023 at 5:17 a.m. FINDINGS: Nasogastric tube has tip and side-port over the stomach in the left upper quadrant. Previously seen dilated air-filled small bowel loops over the upper abdomen not present on the current exam. No free peritoneal air. Skin staples over the lower midline abdomen. Perihilar linear density likely atelectasis. Possible small amount left pleural fluid. Remainder of the exam is unchanged. IMPRESSION: 1. Nasogastric tube with tip and side-port over the stomach in the left upper quadrant. 2. Previously seen dilated air-filled small bowel loops over the upper abdomen not present on the current exam. Electronically Signed   By: Toribio Agreste M.D.   On: 10/08/2023 12:50   DG Chest 1 View Result Date: 10/07/2023 CLINICAL DATA:  Status post PICC placement EXAM: CHEST  1 VIEW COMPARISON:  Chest x-ray 10/07/2023 FINDINGS: Right upper extremity distal PICC line is seen projecting cranially in the right neck. The distal tip is not included on the image. This is unchanged from prior. Enteric tube is partially visualized. The heart is mildly enlarged. There small bilateral pleural effusions and minimal bibasilar infiltrates. No pneumothorax or acute fracture. IMPRESSION: 1. Malpositioned right upper extremity distal PICC line is seen projecting cranially in  the right neck. The distal tip is not included on the image. This is unchanged from prior. 2. Small bilateral pleural effusions and minimal bibasilar infiltrates. Electronically Signed   By: Greig Pique M.D.   On: 10/07/2023 19:26   DG CHEST PORT 1 VIEW Result Date: 10/07/2023 CLINICAL DATA:  PICC line placement EXAM: PORTABLE CHEST 1 VIEW COMPARISON:  10/04/2023 FINDINGS: Right PICC line courses superiorly into the right internal jugular vein and projects off the superior aspect of the image. Tip is not visualized. NG tube is in the stomach. Heart and  mediastinal contours are within normal limits. Worsening bilateral lower lobe airspace disease and probable layering effusions. No acute bony abnormality. IMPRESSION: Right PICC line courses superiorly into the right internal jugular vein and projects off the superior aspect of the image. Layering bilateral effusions with worsening bilateral lower lobe airspace disease. Electronically Signed   By: Franky Crease M.D.   On: 10/07/2023 18:36   US  EKG SITE RITE Result Date: 10/07/2023 If Site Rite image not attached, placement could not be confirmed due to current cardiac rhythm.  DG Abd 1 View Result Date: 10/07/2023 CLINICAL DATA:  Follow-up small bowel obstruction. EXAM: ABDOMEN - 1 VIEW COMPARISON:  10/04/2023 and abdomen and pelvis CT dated 10/05/2023. FINDINGS: Again demonstrated are multiple dilated small bowel loops. These currently measure up to 5.0 cm in maximum diameter, previously 5.9 cm on 10/04/2023. No significant colon gas. A nasogastric tube remains in place with its tip in the mid stomach and side hole in the proximal to mid stomach. Stable thoracolumbar scoliosis and degenerative changes. IMPRESSION: Persistent small bowel obstruction with mild improvement. Electronically Signed   By: Elspeth Bathe M.D.   On: 10/07/2023 09:59        Scheduled Meds:  Chlorhexidine  Gluconate Cloth  6 each Topical Daily   ciprofloxacin -dexamethasone   4 drop Left EAR BID   cycloSPORINE   1 drop Both Eyes BID   insulin  aspart  0-9 Units Subcutaneous Q4H   latanoprost   1 drop Both Eyes QHS   lidocaine   1 patch Transdermal Q24H   sodium chloride  flush  10-40 mL Intracatheter Q12H   [START ON 10/09/2023] thiamine  (VITAMIN B1) injection  100 mg Intravenous Q24H   timolol   1 drop Both Eyes Daily   Continuous Infusions:  sodium chloride  100 mL/hr at 10/08/23 1531   Followed by   sodium chloride      acetaminophen      albumin  human 60 mL/hr at 10/08/23 1531   heparin  700 Units/hr (10/08/23 1531)   TPN  ADULT (ION)       LOS: 7 days    Time spent: 45 minutes    Toribio Hummer, MD [facility]   To contact the attending provider between 7A-7P or the covering provider during after hours 7P-7A, please log into the web site [email or web address] and access using universal [place] password for that web site. If you do not have the password, please call the hospital operator.  10/08/2023, 5:24 PM

## 2023-10-08 NOTE — Evaluation (Signed)
 Occupational Therapy Evaluation Patient Details Name: Tracey [name] MRN: [id number] DOB: 29-Nov-1940 Today's Date: 10/08/2023   History of Present Illness   Patient is an 83 yr old female admitted with abdominal pain, nausea, vomiting, and abdominal distention. She was found to a SBO. She is now s/p an exploratory laparotomy with lysis of adhesions on 10-07-23. PMH: cognitive impairment, bipolar disorder, possible Parkinsons disease, rection of GIST tumor, glaucoma, HTN     Clinical Impressions The pt is currently presenting well below her baseline level of functioning for self-care management. She is limited by the below listed deficits (see OT problem list). As such, her occupational performance is impaired and she requires assistance for self-care management. During the session, she required max assist for lower body dressing/doffing and donning socks in sitting, min assist for sit to stand using a RW, min-mod assist to step-pivot from the chair to bed using a RW, and mod assist for sit to supine. She was further noted to be with general deconditioning, generalized weakness, reports of L ear pain, and decreased endurance. She will benefit from further OT services to maximize her independence with self-care tasks and to decrease the risk for further weakness and deconditioning. Patient will benefit from continued inpatient follow up therapy, <3 hours/day.      If plan is discharge home, recommend the following:   A lot of help with walking and/or transfers;A lot of help with bathing/dressing/bathroom;Assistance with cooking/housework     Functional Status Assessment   Patient has had a recent decline in their functional status and demonstrates the ability to make significant improvements in function in a reasonable and predictable amount of time.     Equipment Recommendations   Other (comment) (defer to next setting, pending functional progress)     Recommendations for Other  Services         Precautions/Restrictions   Precautions Precautions: Fall Precaution/Restrictions Comments: NG tube Restrictions Weight Bearing Restrictions Per Provider Order: No Other Position/Activity Restrictions: abdominal surgery     Mobility Bed Mobility    General bed mobility comments: Pt was received seated in the bedside chair    Transfers Overall transfer level: Needs assistance Equipment used: Rolling walker (2 wheels) Transfers: Sit to/from Stand, Bed to chair/wheelchair/BSC Sit to Stand: Min assist     Step pivot transfers: Min assist     General transfer comment:  (She was instructed on a step-pivot transfer from the chair to bed, requiring instruction on trunk extension, walker advancement, and reaching back for bed prior to sitting)      Balance     Sitting balance-Leahy Scale: Fair       Standing balance-Leahy Scale: Poor            ADL either performed or assessed with clinical judgement   ADL Overall ADL's : Needs assistance/impaired Eating/Feeding: Set up;NPO;Sitting   Grooming: Minimal assistance;Sitting   Upper Body Bathing: Set up;Supervision/ safety;Sitting   Lower Body Bathing: Maximal assistance;Sit to/from stand;Sitting/lateral leans   Upper Body Dressing : Minimal assistance;Sitting   Lower Body Dressing: Maximal assistance;Sitting/lateral leans;Sit to/from stand       Toileting- Architect and Hygiene: Maximal assistance Toileting - Clothing Manipulation Details (indicate cue type and reason): at bedside commode level, based on clinical judgement              Pertinent Vitals/Pain Pain Assessment Pain Assessment: 0-10 Pain Score:  (a little) Pain Location: L ear Pain Intervention(s): Monitored during session, RN gave pain meds during  session, Patient requesting pain meds-RN notified     Extremity/Trunk Assessment Upper Extremity Assessment Upper Extremity Assessment: Right hand dominant;LUE  deficits/detail;Generalized weakness;RUE deficits/detail RUE Deficits / Details: AROM WFL. Grip strength 4-/5 LUE Deficits / Details: AROM WFL. Grip strength 4-/5   Lower Extremity Assessment Lower Extremity Assessment: Generalized weakness       Communication Communication Communication: No apparent difficulties   Cognition Arousal: Alert Behavior During Therapy: WFL for tasks assessed/performed, Flat affect               OT - Cognition Comments: Oriented to person, place, month and year. Able to follow 1 step commands consistently.                 Following commands: Intact       Cueing  General Comments   Cueing Techniques: Verbal cues              Home Living Family/patient expects to be discharged to:: Assisted living [facility])          Home Equipment: Rollator (4 wheels)          Prior Functioning/Environment Prior Level of Function : Independent/Modified Independent;Needs assist             Mobility Comments:  (She used a rollator for ambulation.) ADLs Comments:  (Pt reported being modfied independent to independent with ADLs. ALF staff manages meals and medication management.)    OT Problem List: Decreased strength;Decreased activity tolerance;Impaired balance (sitting and/or standing);Decreased coordination;Decreased knowledge of use of DME or AE;Decreased knowledge of precautions;Pain   OT Treatment/Interventions: Self-care/ADL training;Therapeutic exercise;Energy conservation;DME and/or AE instruction;Patient/family education;Therapeutic activities;Balance training      OT Goals(Current goals can be found in the care plan section)   Acute Rehab OT Goals OT Goal Formulation: With patient Time For Goal Achievement: 10/22/23 Potential to Achieve Goals: Good ADL Goals Pt Will Perform Upper Body Dressing: with set-up;sitting Pt Will Perform Lower Body Dressing: with contact guard assist;sitting/lateral leans;sit to/from  stand Pt Will Transfer to Toilet: with contact guard assist;ambulating Pt Will Perform Toileting - Clothing Manipulation and hygiene: with contact guard assist;sit to/from stand   OT Frequency:  Min 2X/week       AM-PAC OT 6 Clicks Daily Activity     Outcome Measure Help from another person eating meals?: Total (NPO) Help from another person taking care of personal grooming?: A Little Help from another person toileting, which includes using toliet, bedpan, or urinal?: A Lot Help from another person bathing (including washing, rinsing, drying)?: A Lot Help from another person to put on and taking off regular upper body clothing?: A Little Help from another person to put on and taking off regular lower body clothing?: A Lot 6 Click Score: 13   End of Session Equipment Utilized During Treatment: Rolling walker (2 wheels) Nurse Communication: Other (comment) (Nurse cleared the pt for therapy. NG tube dislodged)  Activity Tolerance: Other (comment) (Fair tolerance) Patient left: in bed;with call bell/phone within reach;with nursing/sitter in room  OT Visit Diagnosis: Unsteadiness on feet (R26.81);Other abnormalities of gait and mobility (R26.89);Muscle weakness (generalized) (M62.81);Pain Pain - Right/Left: Left Pain - part of body:  (ear)                Time: 8885-8861 OT Time Calculation (min): 24 min Charges:  OT General Charges $OT Visit: 1 Visit OT Evaluation $OT Eval Moderate Complexity: 1 Mod OT Treatments $Therapeutic Activity: 8-22 mins   Delanna JINNY Lesches, OTR/L 10/08/2023, 1:26 PM

## 2023-10-08 NOTE — Progress Notes (Signed)
 Honey comb dressing with serosanguinous leakage at bottom that caused dressing to begin peeling. Verbal to remove honeycomb dressing and cover site with ABD and tape from [place] PA with surgery.

## 2023-10-08 NOTE — Progress Notes (Signed)
 Physical Therapy Treatment Patient Details Name: Tracey [name] MRN: [id number] DOB: 02/07/1940 Today's Date: 10/08/2023   History of Present Illness Patient is an 83 yo female admitted with SBO. PMH: cognitive impairment, bipolar disorder, possible Parkinsons disease and h/o rection of GIST tumor, glaucoma, HTN    PT Comments  POD # 1 LAP Pt seen in ICU RM # [id number] AxO x 3 sweet Lady who resides at [facility].  She was amb IND with her Rollator. Assisted to EOB.  General bed mobility comments: required increased assist this session due to recent ABD surgery.  Using bed pad to complete scooting to EOB.  Once EOB, Pt able to static sit at Supervision level with some c/o ABD, more c/o neck pain and mild dizziness.  Allowed increased time seated EOB x 3 min.  Dizziness decreased. General transfer comment: assisted from EOB to [facility] with + 2 assist for safety and multiple lines with VC's on proper hand placement and turn completion.  Pt sat on BSC x 7 min when MD arrived. General Gait Details: Pt tolerated amb 22 feet with RW at + 2 assist for safety for equipment/recliner.  Avg HR 78.  No reports of dizziness, just fatigue.  Posture slightly forward flexed. Positioned in recliner to comfort and returned to room.  Lpt has rec Pt will need ST Rehab at SNF to address mobility and functional decline prior to safely returning home at ALF.    If plan is discharge home, recommend the following:     Can travel by private vehicle     Yes  Equipment Recommendations  None recommended by PT    Recommendations for Other Services       Precautions / Restrictions Precautions Precautions: Fall Precaution/Restrictions Comments: NG tube, Recent ABD surgery Restrictions Weight Bearing Restrictions Per Provider Order: No     Mobility  Bed Mobility Overal bed mobility: Needs Assistance Bed Mobility: Supine to Sit     Supine to sit: Used rails, HOB elevated, Mod assist, Max assist      General bed mobility comments: required increased assist this session due to recent ABD surgery.  Using bed pad to complete scooting to EOB.  Once EOB, Pt able to static sit at Supervision level with some c/o ABD, more c/o neck pain and mild dizziness.  Allowed increased time seated EOB x 3 min.  Dizziness decreased.    Transfers Overall transfer level: Needs assistance Equipment used: Rolling walker (2 wheels), None Transfers: Sit to/from Stand, Bed to chair/wheelchair/BSC Sit to Stand: Min assist   Step pivot transfers: Mod assist       General transfer comment: assisted from EOB to [facility] with + 2 assist for safety and multiple lines with VC's on proper hand placement and turn completion.  Pt sat on BSC x 7 min when MD arrived.    Ambulation/Gait Ambulation/Gait assistance: Min assist, +2 safety/equipment, +2 physical assistance Gait Distance (Feet): 22 Feet Assistive device: Rolling walker (2 wheels) Gait Pattern/deviations: Step-to pattern, Decreased stride length, Shuffle, Trunk flexed Gait velocity: decreased     General Gait Details: Pt tolerated amb 22 feet with RW at + 2 assist for safety for equipment/recliner.  Avg HR 78.  No reports of dizziness, just fatigue.  Posture slightly forward flexed.   Stairs             Wheelchair Mobility     Tilt Bed    Modified Rankin (Stroke Patients Only)  Balance                                            Communication Communication Communication: No apparent difficulties  Cognition Arousal: Alert Behavior During Therapy: WFL for tasks assessed/performed, Flat affect   PT - Cognitive impairments: No apparent impairments                       PT - Cognition Comments: AxO x 3 sweet Lady who resides at [facility].  She was amb IND with her Rollator. Following commands: Intact      Cueing Cueing Techniques: Verbal cues  Exercises      General Comments         Pertinent Vitals/Pain Pain Assessment Pain Assessment: Faces Faces Pain Scale: Hurts little more Pain Location: ABD with activity Pain Descriptors / Indicators: Discomfort, Grimacing, Operative site guarding Pain Intervention(s): Monitored during session, Premedicated before session, Repositioned    Home Living Family/patient expects to be discharged to:: Assisted living ([facility])                 Home Equipment: Rollator (4 wheels)      Prior Function            PT Goals (current goals can now be found in the care plan section) Progress towards PT goals: Progressing toward goals    Frequency    Min 2X/week      PT Plan      Co-evaluation              AM-PAC PT 6 Clicks Mobility   Outcome Measure  Help needed turning from your back to your side while in a flat bed without using bedrails?: A Lot Help needed moving from lying on your back to sitting on the side of a flat bed without using bedrails?: A Lot Help needed moving to and from a bed to a chair (including a wheelchair)?: A Lot Help needed standing up from a chair using your arms (e.g., wheelchair or bedside chair)?: A Lot Help needed to walk in hospital room?: A Lot Help needed climbing 3-5 steps with a railing? : Total 6 Click Score: 11    End of Session Equipment Utilized During Treatment: Gait belt Activity Tolerance: Patient tolerated treatment well;Patient limited by fatigue Patient left: in chair;with call bell/phone within reach;with chair alarm set Nurse Communication: Mobility status PT Visit Diagnosis: Muscle weakness (generalized) (M62.81);Other abnormalities of gait and mobility (R26.89);Unsteadiness on feet (R26.81);Other symptoms and signs involving the nervous system (R29.898)     Time: 1004-1030 PT Time Calculation (min) (ACUTE ONLY): 26 min  Charges:    $Gait Training: 8-22 mins $Therapeutic Activity: 8-22 mins PT General Charges $$ ACUTE PT VISIT: 1 Visit                      Katheryn Leap  PTA Acute  Rehabilitation Services Office M-F          [phone]

## 2023-10-08 NOTE — Progress Notes (Signed)
 Peripherally Inserted Central Catheter Exchange  The IV Nurse has discussed with the patient and/or persons authorized to consent for the patient, the purpose of this procedure and the potential benefits and risks involved with this procedure.  The benefits include less needle sticks, lab draws from the catheter, and the patient may be discharged home with the catheter. Risks include, but not limited to, infection, bleeding, blood clot (thrombus formation), and puncture of an artery; nerve damage and irregular heartbeat and possibility to perform a PICC exchange if needed/ordered by physician.  Alternatives to this procedure were also discussed.  Bard Power PICC patient education guide, fact sheet on infection prevention and patient information card has been provided to patient /or left at bedside. Son at bedside and signed consent 10/07/23.   PICC Exchange Documentation  PICC Double Lumen 10/07/23 Right Brachial 37 cm 0 cm (Active)  Indication for Insertion or Continuance of Line Administration of hyperosmolar/irritating solutions (i.e. TPN, Vancomycin , etc.) 10/08/23 0900  Exposed Catheter (cm) 0 cm 10/07/23 1859  Site Assessment Clean, Dry, Intact 10/07/23 1859  Lumen #1 Status Other (Comment) 10/08/23 0900  Lumen #2 Status Other (Comment) 10/08/23 0900  Dressing Type Transparent;Securing device 10/08/23 0900  Dressing Status Antimicrobial disc/dressing in place;Clean, Dry, Intact 10/08/23 0900  Line Care Connections checked and tightened 10/08/23 0900  Line Adjustment (NICU/IV Team Only) No 10/07/23 1821  Dressing Intervention New dressing;Adhesive placed at insertion site (IV team only) 10/07/23 1821  Dressing Change Due 10/14/23 10/08/23 0900     PICC Double Lumen 10/08/23 Right Brachial 37 cm 1 cm (Active)  Indication for Insertion or Continuance of Line Administration of hyperosmolar/irritating solutions (i.e. TPN, Vancomycin , etc.) 10/08/23 1232  Exposed Catheter (cm) 1 cm 10/08/23 1232   Site Assessment Clean, Dry, Intact 10/08/23 1232  Lumen #1 Status Flushed;Saline locked;Blood return noted 10/08/23 1232  Lumen #2 Status Flushed;Saline locked;Blood return noted 10/08/23 1232  Dressing Type Transparent;Securing device 10/08/23 1232  Dressing Status Antimicrobial disc/dressing in place;Clean, Dry, Intact 10/08/23 1232  Line Care Connections checked and tightened 10/08/23 1232  Line Adjustment (NICU/IV Team Only) No 10/08/23 1232  Dressing Intervention New dressing;Adhesive placed at insertion site (IV team only) 10/08/23 1232       Leita Shipper 10/08/2023, 12:33 PM

## 2023-10-08 NOTE — Progress Notes (Signed)
 [facility] Progress Note  1 Day Post-Op  Subjective: Awake.  Some pain, but seems well controlled right now.  No issues overnight.  Objective: Vital signs in last 24 hours: Temp:  [97.4 F (36.3 C)-98.1 F (36.7 C)] 97.5 F (36.4 C) (09/24 0330) Pulse Rate:  [56-81] 77 (09/24 0630) Resp:  [15-49] 26 (09/24 0630) BP: (74-144)/(32-82) 109/44 (09/24 0630) SpO2:  [93 %-100 %] 98 % (09/24 0630) Weight:  [50.2 kg-57.5 kg] 57.5 kg (09/24 0500) Last BM Date : 09/29/23  Intake/Output from previous day: 09/23 0701 - 09/24 0700 In: 4405 [I.V.:2414.5; IV Piggyback:1990.5] Out: 3095 [Urine:2145; Emesis/NG output:900; Blood:50] Intake/Output this shift: Total I/O In: 892.1 [I.V.:700.6; IV Piggyback:191.5] Out: -   PE: Gen:  Alert, NAD.  NGT in place with bilious output.   Abd: Soft, mild distention particularly of lower abdomen, appropriately tender, midline incision and lap incisions covered. Honeycomb with some drainage noted, but minimal   Lab Results:  Recent Labs    10/07/23 0312 10/08/23 0317  WBC 7.3 22.2*  HGB 11.2* 9.4*  HCT 35.9* 29.3*  PLT 327 232   BMET Recent Labs    10/07/23 0313 10/08/23 0317  NA 144 146*  K 3.8 4.2  CL 112* 114*  CO2 23 22  GLUCOSE 135* 105*  BUN 17 16  CREATININE 0.94 1.11*  CALCIUM 9.5 9.4   PT/INR No results for input(s): LABPROT, INR in the last 72 hours. CMP     Component Value Date/Time   NA 146 (H) 10/08/2023 0317   NA 139 01/26/2020 0000   NA 141 04/24/2012 0929   K 4.2 10/08/2023 0317   K 4.4 04/24/2012 0929   CL 114 (H) 10/08/2023 0317   CL 109 (H) 04/24/2012 0929   CO2 22 10/08/2023 0317   CO2 23 04/24/2012 0929   GLUCOSE 105 (H) 10/08/2023 0317   GLUCOSE 85 04/24/2012 0929   BUN 16 10/08/2023 0317   BUN 19 01/26/2020 0000   BUN 20.6 04/24/2012 0929   CREATININE 1.11 (H) 10/08/2023 0317   CREATININE 0.79 04/18/2023 1524   CREATININE 0.9 04/24/2012 0929   CALCIUM 9.4 10/08/2023 0317    CALCIUM 10.5 (H) 01/12/2020 2123   CALCIUM 10.0 04/24/2012 0929   PROT 4.8 (L) 10/08/2023 0317   PROT 6.7 04/24/2012 0929   ALBUMIN  3.5 10/08/2023 0317   ALBUMIN  3.5 04/24/2012 0929   AST 17 10/08/2023 0317   AST 20 04/24/2012 0929   ALT 8 10/08/2023 0317   ALT 12 04/24/2012 0929   ALKPHOS 36 (L) 10/08/2023 0317   ALKPHOS 75 04/24/2012 0929   BILITOT 0.4 10/08/2023 0317   BILITOT 0.36 04/24/2012 0929   GFRNONAA 49 (L) 10/08/2023 0317   GFRNONAA 58 (L) 05/11/2020 0700   GFRAA 68 05/11/2020 0700   Lipase     Component Value Date/Time   LIPASE 22 09/18/2013 1810       Studies/Results: DG Chest 1 View Result Date: 10/07/2023 CLINICAL DATA:  Status post PICC placement EXAM: CHEST  1 VIEW COMPARISON:  Chest x-ray 10/07/2023 FINDINGS: Right upper extremity distal PICC line is seen projecting cranially in the right neck. The distal tip is not included on the image. This is unchanged from prior. Enteric tube is partially visualized. The heart is mildly enlarged. There small bilateral pleural effusions and minimal bibasilar infiltrates. No pneumothorax or acute fracture. IMPRESSION: 1. Malpositioned right upper extremity distal PICC line is seen projecting cranially in the right neck. The distal tip is not  included on the image. This is unchanged from prior. 2. Small bilateral pleural effusions and minimal bibasilar infiltrates. Electronically Signed   By: Greig Pique M.D.   On: 10/07/2023 19:26   DG CHEST PORT 1 VIEW Result Date: 10/07/2023 CLINICAL DATA:  PICC line placement EXAM: PORTABLE CHEST 1 VIEW COMPARISON:  10/04/2023 FINDINGS: Right PICC line courses superiorly into the right internal jugular vein and projects off the superior aspect of the image. Tip is not visualized. NG tube is in the stomach. Heart and mediastinal contours are within normal limits. Worsening bilateral lower lobe airspace disease and probable layering effusions. No acute bony abnormality. IMPRESSION: Right PICC  line courses superiorly into the right internal jugular vein and projects off the superior aspect of the image. Layering bilateral effusions with worsening bilateral lower lobe airspace disease. Electronically Signed   By: Franky Crease M.D.   On: 10/07/2023 18:36   US  EKG SITE RITE Result Date: 10/07/2023 If Site Rite image not attached, placement could not be confirmed due to current cardiac rhythm.  DG Abd 1 View Result Date: 10/07/2023 CLINICAL DATA:  Follow-up small bowel obstruction. EXAM: ABDOMEN - 1 VIEW COMPARISON:  10/04/2023 and abdomen and pelvis CT dated 10/05/2023. FINDINGS: Again demonstrated are multiple dilated small bowel loops. These currently measure up to 5.0 cm in maximum diameter, previously 5.9 cm on 10/04/2023. No significant colon gas. A nasogastric tube remains in place with its tip in the mid stomach and side hole in the proximal to mid stomach. Stable thoracolumbar scoliosis and degenerative changes. IMPRESSION: Persistent small bowel obstruction with mild improvement. Electronically Signed   By: Elspeth Bathe M.D.   On: 10/07/2023 09:59     Anti-infectives: Anti-infectives (From admission, onward)    Start     Dose/Rate Route Frequency Ordered Stop   10/07/23 0915  cefoTEtan  (CEFOTAN ) 2 g in sodium chloride  0.9 % 100 mL IVPB        2 g 200 mL/hr over 30 Minutes Intravenous On call to O.R. 10/07/23 0820 10/07/23 1938        Assessment/Plan POD 1, s/p dx lap converted to ex lap with LOA and repair of enterotomy, Dr. Dasie 9/23 forHigh grade pSBO - await return of bowel function, cont NGT for now -PT/OT to mobilize -surgically ok to removal foley per primary.  Has been in since 9/21 -WBC up to 22K today but not unexpected post op, follow and check in am -hgb down to 9.4, monitor, no signs of bleeding.  Ok to resume heparin  gtt today  -pulm toilet, IS -multi-modal pain control -picc and start TNA today for nutrition  FEN - NGT/NPO/IVFs per medicine/cards VTE  - heparin  gtt ok to resume ID - cefotetan  on call to OR    Per [facility] --  Dementia  GERD Tremors  HTN  Anxiety  A fib RVR - on amio gtt, heparin  gtt can be resumed   LOS: 7 days     Burnard FORBES Banter, PA-C  [facility] 10/08/23 7:53 AM

## 2023-10-09 DIAGNOSIS — K56609 Unspecified intestinal obstruction, unspecified as to partial versus complete obstruction: Secondary | ICD-10-CM | POA: Diagnosis not present

## 2023-10-09 DIAGNOSIS — D62 Acute posthemorrhagic anemia: Secondary | ICD-10-CM | POA: Insufficient documentation

## 2023-10-09 DIAGNOSIS — I4891 Unspecified atrial fibrillation: Secondary | ICD-10-CM | POA: Diagnosis not present

## 2023-10-09 DIAGNOSIS — I10 Essential (primary) hypertension: Secondary | ICD-10-CM | POA: Diagnosis not present

## 2023-10-09 DIAGNOSIS — D509 Iron deficiency anemia, unspecified: Secondary | ICD-10-CM | POA: Insufficient documentation

## 2023-10-09 DIAGNOSIS — E870 Hyperosmolality and hypernatremia: Secondary | ICD-10-CM | POA: Insufficient documentation

## 2023-10-09 DIAGNOSIS — I959 Hypotension, unspecified: Secondary | ICD-10-CM | POA: Diagnosis not present

## 2023-10-09 LAB — GLUCOSE, CAPILLARY
Glucose-Capillary: 114 mg/dL — ABNORMAL HIGH (ref 70–99)
Glucose-Capillary: 136 mg/dL — ABNORMAL HIGH (ref 70–99)
Glucose-Capillary: 137 mg/dL — ABNORMAL HIGH (ref 70–99)
Glucose-Capillary: 144 mg/dL — ABNORMAL HIGH (ref 70–99)
Glucose-Capillary: 158 mg/dL — ABNORMAL HIGH (ref 70–99)
Glucose-Capillary: 167 mg/dL — ABNORMAL HIGH (ref 70–99)

## 2023-10-09 LAB — CBC
HCT: 25.5 % — ABNORMAL LOW (ref 36.0–46.0)
Hemoglobin: 8 g/dL — ABNORMAL LOW (ref 12.0–15.0)
MCH: 32.8 pg (ref 26.0–34.0)
MCHC: 31.4 g/dL (ref 30.0–36.0)
MCV: 104.5 fL — ABNORMAL HIGH (ref 80.0–100.0)
Platelets: 197 K/uL (ref 150–400)
RBC: 2.44 MIL/uL — ABNORMAL LOW (ref 3.87–5.11)
RDW: 13.9 % (ref 11.5–15.5)
WBC: 26.5 K/uL — ABNORMAL HIGH (ref 4.0–10.5)
nRBC: 0 % (ref 0.0–0.2)

## 2023-10-09 LAB — COMPREHENSIVE METABOLIC PANEL WITH GFR
ALT: 6 U/L (ref 0–44)
AST: 17 U/L (ref 15–41)
Albumin: 3.8 g/dL (ref 3.5–5.0)
Alkaline Phosphatase: 100 U/L (ref 38–126)
Anion gap: 11 (ref 5–15)
BUN: 19 mg/dL (ref 8–23)
CO2: 24 mmol/L (ref 22–32)
Calcium: 10.2 mg/dL (ref 8.9–10.3)
Chloride: 118 mmol/L — ABNORMAL HIGH (ref 98–111)
Creatinine, Ser: 1.01 mg/dL — ABNORMAL HIGH (ref 0.44–1.00)
GFR, Estimated: 55 mL/min — ABNORMAL LOW (ref >60)
Glucose, Bld: 146 mg/dL — ABNORMAL HIGH (ref 70–99)
Potassium: 3.3 mmol/L — ABNORMAL LOW (ref 3.5–5.1)
Sodium: 154 mmol/L — ABNORMAL HIGH (ref 135–145)
Total Bilirubin: 0.4 mg/dL (ref 0.0–1.2)
Total Protein: 5.3 g/dL — ABNORMAL LOW (ref 6.5–8.1)

## 2023-10-09 LAB — CULTURE, BLOOD (ROUTINE X 2)
Culture: NO GROWTH
Culture: NO GROWTH

## 2023-10-09 LAB — IRON AND TIBC
Iron: <10 ug/dL — ABNORMAL LOW (ref 28–170)
TIBC: 106 ug/dL — ABNORMAL LOW (ref 250–450)

## 2023-10-09 LAB — HEPARIN LEVEL (UNFRACTIONATED)
Heparin Unfractionated: 0.17 [IU]/mL — ABNORMAL LOW (ref 0.30–0.70)
Heparin Unfractionated: 0.24 [IU]/mL — ABNORMAL LOW (ref 0.30–0.70)
Heparin Unfractionated: 0.31 [IU]/mL (ref 0.30–0.70)

## 2023-10-09 LAB — FOLATE: Folate: >20 ng/mL (ref >5.9)

## 2023-10-09 LAB — VITAMIN B12: Vitamin B-12: 1702 pg/mL — ABNORMAL HIGH (ref 180–914)

## 2023-10-09 LAB — URINE CULTURE: Culture: NO GROWTH

## 2023-10-09 LAB — MAGNESIUM: Magnesium: 2.2 mg/dL (ref 1.7–2.4)

## 2023-10-09 LAB — HEMOGLOBIN AND HEMATOCRIT, BLOOD
HCT: 33.2 % — ABNORMAL LOW (ref 36.0–46.0)
Hemoglobin: 10.5 g/dL — ABNORMAL LOW (ref 12.0–15.0)

## 2023-10-09 LAB — PREPARE RBC (CROSSMATCH)

## 2023-10-09 LAB — FERRITIN: Ferritin: 205 ng/mL (ref 11–307)

## 2023-10-09 LAB — PHOSPHORUS: Phosphorus: 2.4 mg/dL — ABNORMAL LOW (ref 2.5–4.6)

## 2023-10-09 MED ORDER — FUROSEMIDE 10 MG/ML IJ SOLN
40.0000 mg | Freq: Once | INTRAMUSCULAR | Status: AC
  Administered 2023-10-09: 40 mg via INTRAVENOUS
  Filled 2023-10-09: qty 4

## 2023-10-09 MED ORDER — ACETAMINOPHEN 325 MG PO TABS
650.0000 mg | ORAL_TABLET | Freq: Once | ORAL | Status: DC
  Filled 2023-10-09: qty 2

## 2023-10-09 MED ORDER — TRAVASOL 10 % IV SOLN
INTRAVENOUS | Status: AC
  Filled 2023-10-09: qty 451.2

## 2023-10-09 MED ORDER — DEXTROSE 5 % IV SOLN
INTRAVENOUS | Status: DC
Start: 2023-10-09 — End: 2023-10-09

## 2023-10-09 MED ORDER — SODIUM CHLORIDE 0.9% IV SOLUTION: Freq: Once | INTRAVENOUS | Status: AC

## 2023-10-09 MED ORDER — POTASSIUM PHOSPHATES 15 MMOLE/5ML IV SOLN
30.0000 mmol | Freq: Once | INTRAVENOUS | Status: AC
  Administered 2023-10-09: 30 mmol via INTRAVENOUS
  Filled 2023-10-09: qty 10

## 2023-10-09 MED ORDER — INSULIN ASPART 100 UNIT/ML IJ SOLN
0.0000 [IU] | INTRAMUSCULAR | Status: DC
  Administered 2023-10-09: 2 [IU] via SUBCUTANEOUS
  Administered 2023-10-09: 3 [IU] via SUBCUTANEOUS
  Administered 2023-10-09 – 2023-10-10 (×3): 2 [IU] via SUBCUTANEOUS
  Administered 2023-10-10 (×2): 3 [IU] via SUBCUTANEOUS
  Administered 2023-10-10 – 2023-10-11 (×2): 2 [IU] via SUBCUTANEOUS
  Administered 2023-10-11: 3 [IU] via SUBCUTANEOUS
  Administered 2023-10-11: 2 [IU] via SUBCUTANEOUS

## 2023-10-09 MED ORDER — FUROSEMIDE 10 MG/ML IJ SOLN
20.0000 mg | Freq: Once | INTRAMUSCULAR | Status: AC
  Administered 2023-10-09: 20 mg via INTRAVENOUS
  Filled 2023-10-09: qty 2

## 2023-10-09 MED ORDER — IRON SUCROSE 200 MG IVPB - SIMPLE MED
200.0000 mg | Freq: Once | Status: DC
  Filled 2023-10-09: qty 110

## 2023-10-09 MED ORDER — ACETAMINOPHEN 10 MG/ML IV SOLN
1000.0000 mg | Freq: Four times a day (QID) | INTRAVENOUS | Status: AC
  Administered 2023-10-09 (×2): 1000 mg via INTRAVENOUS
  Filled 2023-10-09 (×2): qty 100

## 2023-10-09 MED ORDER — DEXTROSE 5 % IV SOLN: INTRAVENOUS | Status: AC

## 2023-10-09 MED ORDER — IRON SUCROSE 200 MG IVPB - SIMPLE MED
200.0000 mg | Freq: Once | Status: AC
  Administered 2023-10-09: 200 mg via INTRAVENOUS
  Filled 2023-10-09: qty 200

## 2023-10-09 NOTE — Progress Notes (Signed)
 [facility] Progress Note  2 Days Post-Op  Subjective: Awake.  Says she isn't having much pain.  Had 1100cc out of NGT yesterday/overnight.  She denies flatus.  She mobilized already yesterday.    Objective: Vital signs in last 24 hours: Temp:  [97.3 F (36.3 C)-98.5 F (36.9 C)] 97.8 F (36.6 C) (09/25 0738) Pulse Rate:  [56-81] 72 (09/25 0612) Resp:  [18-32] 21 (09/25 0612) BP: (123-163)/(38-80) 159/61 (09/25 0612) SpO2:  [90 %-100 %] 99 % (09/25 0612) Weight:  [54.1 kg] 54.1 kg (09/25 0404) Last BM Date : 09/29/23  Intake/Output from previous day: 09/24 0701 - 09/25 0700 In: 3796.2 [I.V.:3024; IV Piggyback:772.1] Out: 2650 [Urine:1300; Emesis/NG output:1350] Intake/Output this shift: No intake/output data recorded.  PE: Gen:  Alert, NAD.  NGT in place with bilious output.   Abd: Soft, but more distended today, midline incision is c/d/I but has been putting out some SS fluid.  This is not bloody, staples intact, appropriately tender   Lab Results:  Recent Labs    10/08/23 0317 10/09/23 0357  WBC 22.2* 26.5*  HGB 9.4* 8.0*  HCT 29.3* 25.5*  PLT 232 197   BMET Recent Labs    10/08/23 0317 10/09/23 0357  NA 146* 154*  K 4.2 3.3*  CL 114* 118*  CO2 22 24  GLUCOSE 105* 146*  BUN 16 19  CREATININE 1.11* 1.01*  CALCIUM 9.4 10.2   PT/INR No results for input(s): LABPROT, INR in the last 72 hours. CMP     Component Value Date/Time   NA 154 (H) 10/09/2023 0357   NA 139 01/26/2020 0000   NA 141 04/24/2012 0929   K 3.3 (L) 10/09/2023 0357   K 4.4 04/24/2012 0929   CL 118 (H) 10/09/2023 0357   CL 109 (H) 04/24/2012 0929   CO2 24 10/09/2023 0357   CO2 23 04/24/2012 0929   GLUCOSE 146 (H) 10/09/2023 0357   GLUCOSE 85 04/24/2012 0929   BUN 19 10/09/2023 0357   BUN 19 01/26/2020 0000   BUN 20.6 04/24/2012 0929   CREATININE 1.01 (H) 10/09/2023 0357   CREATININE 0.79 04/18/2023 1524   CREATININE 0.9 04/24/2012 0929   CALCIUM 10.2 10/09/2023  0357   CALCIUM 10.5 (H) 01/12/2020 2123   CALCIUM 10.0 04/24/2012 0929   PROT 5.3 (L) 10/09/2023 0357   PROT 6.7 04/24/2012 0929   ALBUMIN  3.8 10/09/2023 0357   ALBUMIN  3.5 04/24/2012 0929   AST 17 10/09/2023 0357   AST 20 04/24/2012 0929   ALT 6 10/09/2023 0357   ALT 12 04/24/2012 0929   ALKPHOS 100 10/09/2023 0357   ALKPHOS 75 04/24/2012 0929   BILITOT 0.4 10/09/2023 0357   BILITOT 0.36 04/24/2012 0929   GFRNONAA 55 (L) 10/09/2023 0357   GFRNONAA 58 (L) 05/11/2020 0700   GFRAA 68 05/11/2020 0700   Lipase     Component Value Date/Time   LIPASE 22 09/18/2013 1810       Studies/Results: DG CHEST PORT 1 VIEW Result Date: 10/08/2023 EXAM: 1 VIEW(S) XRAY OF THE CHEST 10/08/2023 01:43:00 PM COMPARISON: 10/07/2023 CLINICAL HISTORY: Encounter for assessment of peripherally inserted central catheter (PICC) [id number]. Encounter for PICC line placement. FINDINGS: LINES, TUBES AND DEVICES: Right upper extremity PICC repositioned. The tip now overlies right atrium. Stable enteric tube. LUNGS AND PLEURA: Bilateral hazy opacities. Small bilateral pleural effusions with associated atelectasis. No pulmonary edema. No pneumothorax. HEART AND MEDIASTINUM: No acute abnormality of the cardiac and mediastinal silhouettes. BONES AND SOFT TISSUES: No  acute osseous abnormality. IMPRESSION: 1. Right upper extremity PICC tip repositioned. The tip now overlies the right atrium. 2. Stable enteric tube. 3. Bilateral small pleural effusions with associated atelectasis. 4. Bilateral hazy pulmonary opacities likely reflect aggressive behavior edema Infection is not excluded. Electronically signed by: Lonni Necessary MD 10/08/2023 02:06 PM EDT RP Workstation: [id number]   DG Abd 1 View Result Date: 10/08/2023 CLINICAL DATA:  Nasogastric tube placement. EXAM: ABDOMEN - 1 VIEW COMPARISON:  10/07/2023 at 5:17 a.m. FINDINGS: Nasogastric tube has tip and side-port over the stomach in the left upper quadrant. Previously  seen dilated air-filled small bowel loops over the upper abdomen not present on the current exam. No free peritoneal air. Skin staples over the lower midline abdomen. Perihilar linear density likely atelectasis. Possible small amount left pleural fluid. Remainder of the exam is unchanged. IMPRESSION: 1. Nasogastric tube with tip and side-port over the stomach in the left upper quadrant. 2. Previously seen dilated air-filled small bowel loops over the upper abdomen not present on the current exam. Electronically Signed   By: Toribio Agreste M.D.   On: 10/08/2023 12:50   DG Chest 1 View Result Date: 10/07/2023 CLINICAL DATA:  Status post PICC placement EXAM: CHEST  1 VIEW COMPARISON:  Chest x-ray 10/07/2023 FINDINGS: Right upper extremity distal PICC line is seen projecting cranially in the right neck. The distal tip is not included on the image. This is unchanged from prior. Enteric tube is partially visualized. The heart is mildly enlarged. There small bilateral pleural effusions and minimal bibasilar infiltrates. No pneumothorax or acute fracture. IMPRESSION: 1. Malpositioned right upper extremity distal PICC line is seen projecting cranially in the right neck. The distal tip is not included on the image. This is unchanged from prior. 2. Small bilateral pleural effusions and minimal bibasilar infiltrates. Electronically Signed   By: Greig Pique M.D.   On: 10/07/2023 19:26   DG CHEST PORT 1 VIEW Result Date: 10/07/2023 CLINICAL DATA:  PICC line placement EXAM: PORTABLE CHEST 1 VIEW COMPARISON:  10/04/2023 FINDINGS: Right PICC line courses superiorly into the right internal jugular vein and projects off the superior aspect of the image. Tip is not visualized. NG tube is in the stomach. Heart and mediastinal contours are within normal limits. Worsening bilateral lower lobe airspace disease and probable layering effusions. No acute bony abnormality. IMPRESSION: Right PICC line courses superiorly into the right  internal jugular vein and projects off the superior aspect of the image. Layering bilateral effusions with worsening bilateral lower lobe airspace disease. Electronically Signed   By: Franky Crease M.D.   On: 10/07/2023 18:36   US  EKG SITE RITE Result Date: 10/07/2023 If Site Rite image not attached, placement could not be confirmed due to current cardiac rhythm.    Anti-infectives: Anti-infectives (From admission, onward)    Start     Dose/Rate Route Frequency Ordered Stop   10/07/23 0915  cefoTEtan  (CEFOTAN ) 2 g in sodium chloride  0.9 % 100 mL IVPB        2 g 200 mL/hr over 30 Minutes Intravenous On call to O.R. 10/07/23 0820 10/07/23 1938        Assessment/Plan POD 2, s/p dx lap converted to ex lap with LOA and repair of enterotomy, Dr. Dasie 9/23 forHigh grade pSBO - await return of bowel function, cont NGT for now, definitely more distended today with more NGT output -PT/OT to mobilize -foley out, purewick in place -WBC up to 26K today.  Continue to monitor.  This  would be very early for post op infection.  Hopefully this downtrends tomorrow. -hgb down to 8 from 9.4, monitor, no signs of bleeding intra-abdominally.  Ok to continue heparin  gtt from surgery standpoint  -pulm toilet, IS -multi-modal pain control -picc/TNA, pharmacy adjusting fluids with TNA today to help with electrolyte abnormalities noted today.  FEN - NGT/NPO/TNA VTE - heparin  gtt  ID - cefotetan  on call to OR    Per [facility] --  Dementia  GERD Tremors  HTN  Anxiety  A fib RVR - on amio gtt, heparin  gtt  Anemia   LOS: 8 days     Burnard FORBES Banter, PA-C  [facility] 10/09/23 8:54 AM

## 2023-10-09 NOTE — Progress Notes (Signed)
 PROGRESS NOTE    Tracey [name]  FMW:[id number] DOB: [birth year] DOA: 10/01/2023 PCP: Mast, Man X, NP    Chief Complaint  Patient presents with   Constipation    Brief Narrative:  [name] is an 83 yo female with PMH cognitive impairment, bipolar, possible Parkinson's, history of GIST who presented with abdominal pain and distension.  Initial CT A/P showed high-grade small bowel obstruction with transition point in the pelvis to the right of the midline and mild mesenteric edema.  She was treated supportively at first and NG tube was placed for decompression. Repeat CT was performed on 10/05/2023 showing ongoing dilated small bowel loops with no discernible contrast in the colon. She was eventually taken to the OR on 10/07/2023 for diagnostic laparoscopy with LOA and repair of prior small bowel enterotomy.   Assessment & Plan:   Principal Problem:   SBO (small bowel obstruction) (HCC) Active Problems:   Difficulty hearing   Essential (primary) hypertension   GERD (gastroesophageal reflux disease)   Major neurocognitive disorder (HCC)   Protein-calorie malnutrition, severe   New onset a-fib (HCC)   Hypotension   Dementia without behavioral disturbance (HCC)   Hypophosphatemia   Left ear pain   Acute postoperative anemia due to expected blood loss   Hypernatremia   Iron  deficiency anemia  #1 SBO -CT abdomen and pelvis done on admission consistent with high-grade small bowel obstruction with transition point in the pelvis to the right of the midline and mild mesenteric edema. - Status post NG tube placement for decompression. - Repeat CT scan done on 10/05/2023 with ongoing dilated small bowel loops with no discernible contrast in the colon. - Patient seen and consulted on by general surgery who followed the patient throughout the hospitalization. - Due to no improvement with conservative treatments patient was subsequently taken to the OR on 10/07/2023 for diagnostic laparoscopy  with LOA and repair of prior small bowel enterotomy. - Currently NG tube in place until bowel function returns per general surgery. - Patient started on TNA (10/08/2023 ) per general surgery. -Pulmonary toileting. - Per general surgery.  2.  Leukocytosis -With a leukocytosis noted on admission with WBC trending up currently at 26.5 from 7.3. -Chest x-ray obtained 10/08/2023 negative for any acute infiltrate. -Urinalysis done nitrite negative, leukocytes negative -Patient afebrile. -Check blood cultures x 2. -If worsening leukocytosis may need CT abdomen and pelvis to rule out abscess formation. -Per general surgery.  3.  Hypotension -Patient noted to be hypotensive during surgery. - Patient noted with a low MAP postoperatively which was downtrending and patient received a LR bolus - Currently on IV albumin  x 2 days.   - Blood pressure has improved.   - Patient with some complaints of shortness of breath, crackles noted on examination at outside we will give a dose of Lasix  40 mg IV x 1 - BP improved. - Gentle hydration. - Patient on TPN.  4.  Postop acute blood loss anemia/severe iron  deficiency anemia - Patient with no overt GI bleed. - Hemoglobin currently at 8.0 from 13.2 on admission -Anemia panel with iron <10, TIBC of 106, folate of > 20. -Will give a dose of IV iron . -Transfuse 1 unit PRBCs. -Follow H&H. -  5.  A-fib with RVR -Currently in normal sinus rhythm with some bouts of bradycardia however asymptomatic. - Patient was seen by cardiology prior to surgery. - Patient was on IV amiodarone  which has subsequently been discontinued. - Patient also placed on heparin  drip which was  held and cleared by general surgery which was started on 10/08/2023.  - Per cardiology, once tolerating oral intake could start on amiodarone  100 mg daily and when okay with general surgery could potentially switch to Eliquis p.o. - Cardiology was following but signed off on 10/07/2023. -  Outpatient follow-up with cardiology.  6.  Severe protein calorie malnutrition -BMI of 20.24 kg/m. - Percent weight loss 11% in 6 months per RD. - Patient currently n.p.o. secondary to problem #1 while awaiting bowel function to return. - Patient started on TPN per general surgery recommendations.  7.  Dementia -Stable. - Delirium precautions.  8.  Volume depletion/dehydration/hypernatremia -Patient noted to be hypotensive postoperatively currently on IV fluids and IV albumin . -BP improved. -Patient with worsening hypernatremia with sodium at 154. -Change IV fluids to D5W at 75 cc an hour for the next 24 hours.  9. hypertension -Noted to be hypotensive. - Follow.  10.  Hypophosphatemia - Phosphorus at 2.4  - Patient on TPN and electrolytes being repleted.  11.  Left ear pain -Concern for otitis externa. - Continue Ciprodex  otic 4 drops to the left ear twice daily x 5 days.    DVT prophylaxis: Heparin  Code Status: DNR Family Communication: Updated patient.  No family at bedside. Disposition: Remain in stepdown unit  Status is: Inpatient Remains inpatient appropriate because: Severity of illness   Consultants:  General Surgery: Dr. Debby 10/01/2023 Cardiology: Dr. Levern 10/05/2023  Procedures:  PICC line placement 10/07/2023 PICC line exchange 10/08/2023 CT abdomen pelvis 10/01/2023 2D echo 10/04/2023 CT chest abdomen and pelvis 10/05/2023 Small bowel protocol 10/02/2023 Diagnostic laparoscopy, ex lap with LOA, primary repair of small bowel enterotomy per general surgery: Dr. Dasie 10/07/2023   Antimicrobials:  Anti-infectives (From admission, onward)    Start     Dose/Rate Route Frequency Ordered Stop   10/07/23 0915  cefoTEtan  (CEFOTAN ) 2 g in sodium chloride  0.9 % 100 mL IVPB        2 g 200 mL/hr over 30 Minutes Intravenous On call to O.R. 10/07/23 0820 10/07/23 1938         Subjective: Patient sitting up in recliner.  Denies any substernal chest pain,  does complain of some shortness of breath.  NG tube in place.  Denies any flatus.  No bowel movement.  No dysuria.  No cough.  No frank bleeding.  Objective: Vitals:   10/09/23 0404 10/09/23 0500 10/09/23 0612 10/09/23 0738  BP:  (!) 158/59 (!) 159/61   Pulse: 66 73 72   Resp: (!) 21 (!) 26 (!) 21   Temp:    97.8 F (36.6 C)  TempSrc:    Oral  SpO2: 98% 99% 99%   Weight: 54.1 kg     Height:        Intake/Output Summary (Last 24 hours) at 10/09/2023 1012 Last data filed at 10/09/2023 9361 Gross per 24 hour  Intake 2617.04 ml  Output 2300 ml  Net 317.04 ml   Filed Weights   10/07/23 0949 10/08/23 0500 10/09/23 0404  Weight: 50.2 kg 57.5 kg 54.1 kg    Examination:  General exam: Appears calm and comfortable.  NG tube in place with bilious drainage. Respiratory system: Decreased breath sounds in the bases.  Crackles noted on the left side.  Fair air movement.  Speaking in full sentences.  No wheezing.   Cardiovascular system: Bradycardia.  No JVD, no murmurs rubs or gallops.  No lower extremity edema.  Gastrointestinal system: Abdomen is soft, distended, some diffuse  tenderness to palpation, hypoactive bowel sounds.  Postop dressing in place.  Central nervous system: Alert and oriented. No focal neurological deficits. Extremities: Symmetric 5 x 5 power. Skin: No rashes, lesions or ulcers Psychiatry: Judgement and insight appear normal. Mood & affect appropriate.     Data Reviewed: I have personally reviewed following labs and imaging studies  CBC: Recent Labs  Lab 10/03/23 0443 10/04/23 0540 10/05/23 0309 10/06/23 0300 10/07/23 0312 10/08/23 0317 10/09/23 0357  WBC 6.4 6.1 5.4 6.5 7.3 22.2* 26.5*  NEUTROABS 4.8 4.0 3.0  --   --   --   --   HGB 12.4 12.5 11.9* 12.6 11.2* 9.4* 8.0*  HCT 39.6 42.1 39.4 40.5 35.9* 29.3* 25.5*  MCV 101.0* 104.7* 103.4* 104.4* 104.1* 103.5* 104.5*  PLT 308 310 314 365 327 232 197    Basic Metabolic Panel: Recent Labs  Lab  10/05/23 0309 10/06/23 0300 10/06/23 1050 10/06/23 2216 10/07/23 0312 10/07/23 0313 10/08/23 0317 10/09/23 0357  NA 147* 153*   < > 145 145 144 146* 154*  K 3.7 4.1   < > 3.8 3.9 3.8 4.2 3.3*  CL 110 118*   < > 112* 112* 112* 114* 118*  CO2 22 25   < > 23 23 23 22 24   GLUCOSE 106* 151*   < > 128* 130* 135* 105* 146*  BUN 28* 19   < > 18 17 17 16 19   CREATININE 0.86 0.88   < > 0.94 0.94 0.94 1.11* 1.01*  CALCIUM 9.5 9.8   < > 9.4 9.3 9.5 9.4 10.2  MG 2.6* 2.5*  --   --  2.2  --  1.9 2.2  PHOS 2.2* 1.8*   < > 2.5 2.4* 2.3* 4.0 2.4*   < > = values in this interval not displayed.    GFR: Estimated Creatinine Clearance: 34 mL/min (A) (by C-G formula based on SCr of 1.01 mg/dL (H)).  Liver Function Tests: Recent Labs  Lab 10/03/23 0443 10/04/23 0540 10/06/23 1717 10/06/23 2216 10/07/23 0313 10/08/23 0317 10/09/23 0357  AST 18  --   --   --   --  17 17  ALT 7  --   --   --   --  8 6  ALKPHOS 65  --   --   --   --  36* 100  BILITOT 0.6  --   --   --   --  0.4 0.4  PROT 6.5  --   --   --   --  4.8* 5.3*  ALBUMIN  3.7   < > 3.1* 2.9* 2.9* 3.5 3.8   < > = values in this interval not displayed.    CBG: Recent Labs  Lab 10/08/23 1650 10/08/23 1944 10/08/23 2318 10/09/23 0317 10/09/23 0740  GLUCAP 77 94 116* 136* 137*     Recent Results (from the past 240 hours)  Culture, blood (Routine X 2) w Reflex to ID Panel     Status: None   Collection Time: 10/04/23  1:10 PM   Specimen: BLOOD LEFT ARM  Result Value Ref Range Status   Specimen Description   Final    BLOOD LEFT ARM Performed at [facility], [address] [place], [place] [address]    Special Requests   Final    BOTTLES DRAWN AEROBIC ONLY Blood Culture results may not be optimal due to an inadequate volume of blood received in culture bottles Performed at [facility],  [address], [place], [place] [address]    Culture   Final    NO GROWTH 5 DAYS Performed at [facility], [address] [place], [place] [address]    Report Status 10/09/2023 FINAL  Final  Culture, blood (Routine X 2) w Reflex to ID Panel     Status: None   Collection Time: 10/04/23  1:10 PM   Specimen: BLOOD LEFT HAND  Result Value Ref Range Status   Specimen Description   Final    BLOOD LEFT HAND Performed at [facility], [address] [place], [place] [address]    Special Requests   Final    BOTTLES DRAWN AEROBIC AND ANAEROBIC Blood Culture results may not be optimal due to an inadequate volume of blood received in culture bottles Performed at [facility], [address], [place], [place] [address]    Culture   Final    NO GROWTH 5 DAYS Performed at [facility], [address], [place], [place] [address]    Report Status 10/09/2023 FINAL  Final  MRSA Next Gen by PCR, Nasal     Status: None   Collection Time: 10/04/23  2:48 PM   Specimen: Nasal Mucosa; Nasal Swab  Result Value Ref Range Status   MRSA by PCR Next Gen NOT DETECTED NOT DETECTED Final    Comment: (NOTE) The GeneXpert MRSA Assay (FDA approved for NASAL specimens only), is one component of a comprehensive MRSA colonization surveillance program. It is not intended to diagnose MRSA infection nor to guide or monitor treatment for MRSA infections. Test performance is not FDA approved in patients less than 50 years old. Performed at [facility], [address] [place], [place] [address]          Radiology Studies: DG CHEST PORT 1 VIEW Result Date: 10/08/2023 EXAM: 1 VIEW(S) XRAY OF THE CHEST 10/08/2023 01:43:00 PM COMPARISON: 10/07/2023 CLINICAL HISTORY: Encounter for assessment of peripherally inserted central catheter (PICC) [id number]. Encounter for PICC line placement. FINDINGS: LINES, TUBES AND DEVICES: Right upper extremity PICC repositioned. The tip now overlies right atrium. Stable enteric tube. LUNGS AND PLEURA: Bilateral hazy opacities. Small  bilateral pleural effusions with associated atelectasis. No pulmonary edema. No pneumothorax. HEART AND MEDIASTINUM: No acute abnormality of the cardiac and mediastinal silhouettes. BONES AND SOFT TISSUES: No acute osseous abnormality. IMPRESSION: 1. Right upper extremity PICC tip repositioned. The tip now overlies the right atrium. 2. Stable enteric tube. 3. Bilateral small pleural effusions with associated atelectasis. 4. Bilateral hazy pulmonary opacities likely reflect aggressive behavior edema Infection is not excluded. Electronically signed by: Lonni Necessary MD 10/08/2023 02:06 PM EDT RP Workstation: [id number]   DG Abd 1 View Result Date: 10/08/2023 CLINICAL DATA:  Nasogastric tube placement. EXAM: ABDOMEN - 1 VIEW COMPARISON:  10/07/2023 at 5:17 a.m. FINDINGS: Nasogastric tube has tip and side-port over the stomach in the left upper quadrant. Previously seen dilated air-filled small bowel loops over the upper abdomen not present on the current exam. No free peritoneal air. Skin staples over the lower midline abdomen. Perihilar linear density likely atelectasis. Possible small amount left pleural fluid. Remainder of the exam is unchanged. IMPRESSION: 1. Nasogastric tube with tip and side-port over the stomach in the left upper quadrant. 2. Previously seen dilated air-filled small bowel loops over the upper abdomen not present on the current exam. Electronically Signed   By: Toribio Agreste M.D.   On: 10/08/2023 12:50  DG Chest 1 View Result Date: 10/07/2023 CLINICAL DATA:  Status post PICC placement EXAM: CHEST  1 VIEW COMPARISON:  Chest x-ray 10/07/2023 FINDINGS: Right upper extremity distal PICC line is seen projecting cranially in the right neck. The distal tip is not included on the image. This is unchanged from prior. Enteric tube is partially visualized. The heart is mildly enlarged. There small bilateral pleural effusions and minimal bibasilar infiltrates. No pneumothorax or acute fracture.  IMPRESSION: 1. Malpositioned right upper extremity distal PICC line is seen projecting cranially in the right neck. The distal tip is not included on the image. This is unchanged from prior. 2. Small bilateral pleural effusions and minimal bibasilar infiltrates. Electronically Signed   By: Greig Pique M.D.   On: 10/07/2023 19:26   DG CHEST PORT 1 VIEW Result Date: 10/07/2023 CLINICAL DATA:  PICC line placement EXAM: PORTABLE CHEST 1 VIEW COMPARISON:  10/04/2023 FINDINGS: Right PICC line courses superiorly into the right internal jugular vein and projects off the superior aspect of the image. Tip is not visualized. NG tube is in the stomach. Heart and mediastinal contours are within normal limits. Worsening bilateral lower lobe airspace disease and probable layering effusions. No acute bony abnormality. IMPRESSION: Right PICC line courses superiorly into the right internal jugular vein and projects off the superior aspect of the image. Layering bilateral effusions with worsening bilateral lower lobe airspace disease. Electronically Signed   By: Franky Crease M.D.   On: 10/07/2023 18:36   US  EKG SITE RITE Result Date: 10/07/2023 If Site Rite image not attached, placement could not be confirmed due to current cardiac rhythm.       Scheduled Meds:  sodium chloride    Intravenous Once   acetaminophen   650 mg Oral Once   Chlorhexidine  Gluconate Cloth  6 each Topical Daily   ciprofloxacin -dexamethasone   4 drop Left EAR BID   cycloSPORINE   1 drop Both Eyes BID   furosemide   20 mg Intravenous Once   insulin  aspart  0-15 Units Subcutaneous Q4H   latanoprost   1 drop Both Eyes QHS   lidocaine   1 patch Transdermal Q24H   sodium chloride  flush  10-40 mL Intracatheter Q12H   thiamine  (VITAMIN B1) injection  100 mg Intravenous Q24H   timolol   1 drop Both Eyes Daily   Continuous Infusions:  acetaminophen      albumin  human Stopped (10/08/23 1938)   dextrose  75 mL/hr at 10/09/23 0825   heparin  900  Units/hr (10/09/23 0955)   potassium PHOSPHATE  IVPB (in mmol) 30 mmol (10/09/23 0952)   TPN ADULT (ION) 40 mL/hr at 10/09/23 9361   TPN ADULT (ION)       LOS: 8 days    Time spent: 45 minutes    Toribio Hummer, MD [facility]   To contact the attending provider between 7A-7P or the covering provider during after hours 7P-7A, please log into the web site [email or web address] and access using universal Commerce password for that web site. If you do not have the password, please call the hospital operator.  10/09/2023, 10:12 AM

## 2023-10-09 NOTE — Plan of Care (Signed)
  Problem: Education: Goal: Knowledge of General Education information will improve Description: Including pain rating scale, medication(s)/side effects and non-pharmacologic comfort measures Outcome: Progressing   Problem: Health Behavior/Discharge Planning: Goal: Ability to manage health-related needs will improve Outcome: Progressing   Problem: Pain Managment: Goal: General experience of comfort will improve and/or be controlled Outcome: Progressing

## 2023-10-09 NOTE — Progress Notes (Signed)
 PHARMACY - ANTICOAGULATION CONSULT NOTE  Pharmacy Consult for Heparin  Indication: atrial fibrillation  Allergies  Allergen Reactions   Lisinopril Cough   Penicillins Itching and Other (See Comments)    50 years ago   Adhesive [Tape] Itching   Atorvastatin Itching   Dilaudid  [Hydromorphone  Hcl] Itching    Patient Measurements: Height: 5' 2 (157.5 cm) Weight: 54.1 kg (119 lb 4.3 oz) IBW/kg (Calculated) : 50.1 HEPARIN  DW (KG): 50.2  Vital Signs: Temp: 98.4 F (36.9 C) (09/25 1639) Temp Source: Oral (09/25 1639) BP: 151/70 (09/25 1800) Pulse Rate: 68 (09/25 1800)  Labs: Recent Labs    10/07/23 0312 10/07/23 0313 10/08/23 0317 10/08/23 2346 10/09/23 0357 10/09/23 0852 10/09/23 1510 10/09/23 1806  HGB 11.2*  --  9.4*  --  8.0*  --  10.5*  --   HCT 35.9*  --  29.3*  --  25.5*  --  33.2*  --   PLT 327  --  232  --  197  --   --   --   HEPARINUNFRC 0.75*  --   --  0.17*  --  0.24*  --  0.31  CREATININE 0.94 0.94 1.11*  --  1.01*  --   --   --     Estimated Creatinine Clearance: 34 mL/min (A) (by C-G formula based on SCr of 1.01 mg/dL (H)).   Medical History: 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, possible    Tremors possible parkinsons   Manic disorder, single episode, in full remission 12/01/2009   Sixth nerve palsy      Assessment: AC/Heme: UFH per Rx for new Afib Hep level 0.24> 0.31 now in goal  range.  Goal of Therapy:  Heparin  level 0.3-0.7 units/ml Monitor platelets by anticoagulation protocol: Yes   Plan:  Con't IV heparin  at 900 units/hr Confirm level in 6 hrs. Daily HL and CBC   Anniebelle Devore Karoline Marina, PharmD, BCPS Clinical Staff Pharmacist Marina Salines Stillinger 10/09/2023,6:54 PM

## 2023-10-09 NOTE — Progress Notes (Addendum)
 PHARMACY - TOTAL PARENTERAL NUTRITION CONSULT NOTE   Indication: Small bowel obstruction  Patient Measurements: Height: 5' 2 (157.5 cm) Weight: 54.1 kg (119 lb 4.3 oz) IBW/kg (Calculated) : 50.1 TPN AdjBW (KG): 50.2 Body mass index is 21.81 kg/m.  Assessment:  83 YO female presenting 9/17 with abdominal pain and distension. Initial CT A/P showed high-grade small bowel obstruction with transition point in the pelvis to the right of the midline and mild mesenteric edema, repeat CT continued to show ongoing dilated small bowel loops. Patient taken to OR 9/23 for diagnostic laparoscopy with LOA and repair of prior small bowel enterotomy. NGT in place. Pharmacy consulted for TPN management in the setting of SBO.   Glucose / Insulin :  CBGs 94-146 since starting TPN yesterday 1 unit of SSI given Electrolytes: Na elevated at 154, K slightly low at 3.3, Ca upper end of normal 10.2, phos slightly low at 2.4, Cl elevated 118 Renal: Scr 1.01 (slightly elevated but improved), BUN WNL Hepatic: All WNL Intake / Output; MIVF:  UOP: -1.3 L/24hrs  NG output: -1.3 L/24hrs 1/2 NS @60ml /hr, per MD GI Imaging: - 9/17 a/p: High-grade small bowel obstruction with transition point in the pelvis to the right of midline - 9/19 DG abd: SBO pattern with no improvement since the prior CT - 9/21 CT chest/a/p: Small bowel loops remain dilated in the abdomen and pelvis. There is some diluted contrast in the dilated small bowel loops, but no discernible contrast in the colonic lumen. The degree of small-bowel dilatation appears stable to mildly progressive in the interval - 9/23 DG abd: persistent SBO with mild improvement GI Surgeries / Procedures:  - 9/23: Diagnostic laparoscopy, exploratory laparotomy with lysis of adhesions, primary repair of small bowel enterotomy  Central access: PICC obtained 9/23 TPN start date: 9/24  Nutritional Goals: Goal TPN rate is 70 mL/hr (provides 79 g of protein and 1608 kcals  per day)  RD Assessment: Estimated Needs Total Energy Estimated Needs: 1500-1700 kcals Total Protein Estimated Needs: 75-90 grams Total Fluid Estimated Needs: >/= 1.5L  Current Nutrition:  NPO  Plan:  Now: Discontinue 1/2 NS infusion Start D5W @75ml /hr, per MD Give Kphos 30 mmol IV x1 Increase to moderate q4h SSI given initiation of D5W in addition to TPN  At 1800: Continue TPN at 62mL/hr (will provide 45g of protein per day and 756 total Kcal per day).  Not advancing rate today given electrolyte abnormalities/likely refeeding.  Decrease dextrose  concentration from 15% to 10% in TPN given addition of D5W infusion. Electrolytes in TPN:  Na 44mEq/L - decreased K 50mEq/L Ca 0mEq/L - decreased Mg 32mEq/L - decreased Phos 15mmol/L Cl:Ac max acetate Add standard MVI and trace elements to TPN Continue Moderate q4h SSI and adjust as needed  Continue D5W at 75mL/hr x24hrs, per MD Thiamine  100mg  IV daily x5 days (thru 9/29) as recommended by RD Monitor TPN labs on Mon/Thurs. Daily CMP/Mg/Phos thru Sunday    Lacinda Moats, PharmD Clinical Pharmacist  9/25/20257:43 AM

## 2023-10-09 NOTE — Progress Notes (Signed)
 PHARMACY - ANTICOAGULATION CONSULT NOTE  Pharmacy Consult for heparin   Indication: atrial fibrillation  Allergies  Allergen Reactions   Lisinopril Cough   Penicillins Itching and Other (See Comments)    50 years ago   Adhesive [Tape] Itching   Atorvastatin Itching   Dilaudid  [Hydromorphone  Hcl] Itching    Patient Measurements: Height: 5' 2 (157.5 cm) Weight: 54.1 kg (119 lb 4.3 oz) IBW/kg (Calculated) : 50.1 HEPARIN  DW (KG): 50.2  Vital Signs: Temp: 97.8 F (36.6 C) (09/25 0738) Temp Source: Oral (09/25 0738) BP: 159/61 (09/25 0612) Pulse Rate: 72 (09/25 0612)  Labs: Recent Labs    10/07/23 0312 10/07/23 0313 10/08/23 0317 10/08/23 2346 10/09/23 0357 10/09/23 0852  HGB 11.2*  --  9.4*  --  8.0*  --   HCT 35.9*  --  29.3*  --  25.5*  --   PLT 327  --  232  --  197  --   HEPARINUNFRC 0.75*  --   --  0.17*  --  0.24*  CREATININE 0.94 0.94 1.11*  --  1.01*  --     Estimated Creatinine Clearance: 34 mL/min (A) (by C-G formula based on SCr of 1.01 mg/dL (H)).   Medical History: 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, possible    Tremors possible parkinsons   Manic disorder, single episode, in full remission 12/01/2009   Sixth nerve palsy     Medications:  No prior to admission anticoagulation meds listed  Assessment: Pharmacy consulted to dose heparin  in 83 yo F admitted with a high grade pSBO.  Pharmacy  consulted to dose Heparin  for new onset Afib with RVR.  No prior to admission anticoagulation.    Today, 10/09/23 Heparin  level 0.24--subtherapeutic on heparin  800 units/hr CBC down-trending post-op >> hgb 8, plts 197. Okay to continue heparin  infusion, per surgery.  No active bleeding (just small amount of serosanguinous fluid) or infusion interruptions, per RN  Goal of Therapy:  Heparin  level 0.3-0.7 units/ml Monitor platelets by anticoagulation protocol: Yes   Plan:  Increase IV heparin  to 900 units/hr Check 8h heparin  level after rate increase Daily heparin  level & CBC while on heparin  Continue to monitor for s/sx of bleeding  F/U long-term anticoagulation plans    Thank you for allowing pharmacy to be a part of this patient's care.  Lacinda Moats, PharmD Clinical Pharmacist  9/25/20259:40 AM

## 2023-10-09 NOTE — Progress Notes (Signed)
 PHARMACY - ANTICOAGULATION CONSULT NOTE  Pharmacy Consult for heparin   Indication: atrial fibrillation  Allergies  Allergen Reactions   Lisinopril Cough   Penicillins Itching and Other (See Comments)    50 years ago   Adhesive [Tape] Itching   Atorvastatin Itching   Dilaudid  [Hydromorphone  Hcl] Itching    Patient Measurements: Height: 5' 2 (157.5 cm) Weight: 57.5 kg (126 lb 12.2 oz) IBW/kg (Calculated) : 50.1 HEPARIN  DW (KG): 50.2  Vital Signs: Temp: 97.3 F (36.3 C) (09/24 2304) Temp Source: Oral (09/24 2304) BP: 149/61 (09/24 2300) Pulse Rate: 79 (09/24 2300)  Labs: Recent Labs    10/06/23 0300 10/06/23 1050 10/07/23 0312 10/07/23 0313 10/08/23 0317 10/08/23 2346  HGB 12.6  --  11.2*  --  9.4*  --   HCT 40.5  --  35.9*  --  29.3*  --   PLT 365  --  327  --  232  --   HEPARINUNFRC 0.37  --  0.75*  --   --  0.17*  CREATININE 0.88   < > 0.94 0.94 1.11*  --    < > = values in this interval not displayed.    Estimated Creatinine Clearance: 30.9 mL/min (A) (by C-G formula based on SCr of 1.11 mg/dL (H)).   Medical History: 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, possible    Tremors possible parkinsons   Manic disorder, single episode, in full remission 12/01/2009   Sixth nerve palsy     Medications:  No prior to admission anticoagulation meds listed  Assessment: Pharmacy consulted to dose heparin  in 83  yo F admitted with a high grade pSBO.  Pharmacy consulted to dose Heparin  for new onset Afib with RVR.  No prior to admission anticoagulation.    Today, 10/09/23 Initial heparin  level after resuming post-op : 0.17- subtherapeutic on IV 700 units/hr CBC: Hgb slightly low post-op and Plt WNL  No active bleeding or infusion interruptions during this shift per RN report  Goal of Therapy:  Heparin  level 0.3-0.7 units/ml Monitor platelets by anticoagulation protocol: Yes   Plan:  Increase IV heparin  800 units/hr Check 8h heparin  level after rate increase Daily heparin  level & CBC while on heparin  F/U long-term anticoagulation plans  Thank you for allowing pharmacy to be a part of this patient's care.  Rosaline Millet, PharmD, BCPS 10/09/2023 1:20 AM

## 2023-10-10 ENCOUNTER — Telehealth (HOSPITAL_COMMUNITY): Payer: Self-pay | Admitting: Pharmacy Technician

## 2023-10-10 ENCOUNTER — Other Ambulatory Visit (HOSPITAL_COMMUNITY): Payer: Self-pay

## 2023-10-10 DIAGNOSIS — I4891 Unspecified atrial fibrillation: Secondary | ICD-10-CM | POA: Diagnosis not present

## 2023-10-10 DIAGNOSIS — I959 Hypotension, unspecified: Secondary | ICD-10-CM | POA: Diagnosis not present

## 2023-10-10 DIAGNOSIS — I10 Essential (primary) hypertension: Secondary | ICD-10-CM | POA: Diagnosis not present

## 2023-10-10 DIAGNOSIS — K56609 Unspecified intestinal obstruction, unspecified as to partial versus complete obstruction: Secondary | ICD-10-CM | POA: Diagnosis not present

## 2023-10-10 LAB — COMPREHENSIVE METABOLIC PANEL WITH GFR
ALT: 10 U/L (ref 0–44)
AST: 17 U/L (ref 15–41)
Albumin: 3.6 g/dL (ref 3.5–5.0)
Alkaline Phosphatase: 81 U/L (ref 38–126)
Anion gap: 13 (ref 5–15)
BUN: 22 mg/dL (ref 8–23)
CO2: 27 mmol/L (ref 22–32)
Calcium: 9.9 mg/dL (ref 8.9–10.3)
Chloride: 110 mmol/L (ref 98–111)
Creatinine, Ser: 0.98 mg/dL (ref 0.44–1.00)
GFR, Estimated: 57 mL/min — ABNORMAL LOW (ref >60)
Glucose, Bld: 141 mg/dL — ABNORMAL HIGH (ref 70–99)
Potassium: 3.1 mmol/L — ABNORMAL LOW (ref 3.5–5.1)
Sodium: 149 mmol/L — ABNORMAL HIGH (ref 135–145)
Total Bilirubin: 0.4 mg/dL (ref 0.0–1.2)
Total Protein: 5.6 g/dL — ABNORMAL LOW (ref 6.5–8.1)

## 2023-10-10 LAB — CBC WITH DIFFERENTIAL/PLATELET
Abs Immature Granulocytes: 0.44 K/uL — ABNORMAL HIGH (ref 0.00–0.07)
Basophils Absolute: 0.1 K/uL (ref 0.0–0.1)
Basophils Relative: 0 %
Eosinophils Absolute: 0.1 K/uL (ref 0.0–0.5)
Eosinophils Relative: 1 %
HCT: 31.7 % — ABNORMAL LOW (ref 36.0–46.0)
Hemoglobin: 10.2 g/dL — ABNORMAL LOW (ref 12.0–15.0)
Immature Granulocytes: 2 %
Lymphocytes Relative: 7 %
Lymphs Abs: 1.6 K/uL (ref 0.7–4.0)
MCH: 32.1 pg (ref 26.0–34.0)
MCHC: 32.2 g/dL (ref 30.0–36.0)
MCV: 99.7 fL (ref 80.0–100.0)
Monocytes Absolute: 0.2 K/uL (ref 0.1–1.0)
Monocytes Relative: 1 %
Neutro Abs: 20 K/uL — ABNORMAL HIGH (ref 1.7–7.7)
Neutrophils Relative %: 89 %
Platelets: 221 K/uL (ref 150–400)
RBC: 3.18 MIL/uL — ABNORMAL LOW (ref 3.87–5.11)
RDW: 16.7 % — ABNORMAL HIGH (ref 11.5–15.5)
WBC: 22.4 K/uL — ABNORMAL HIGH (ref 4.0–10.5)
nRBC: 0 % (ref 0.0–0.2)

## 2023-10-10 LAB — GLUCOSE, CAPILLARY
Glucose-Capillary: 116 mg/dL — ABNORMAL HIGH (ref 70–99)
Glucose-Capillary: 136 mg/dL — ABNORMAL HIGH (ref 70–99)
Glucose-Capillary: 127 mg/dL — ABNORMAL HIGH (ref 70–99)
Glucose-Capillary: 137 mg/dL — ABNORMAL HIGH (ref 70–99)
Glucose-Capillary: 125 mg/dL — ABNORMAL HIGH (ref 70–99)
Glucose-Capillary: 147 mg/dL — ABNORMAL HIGH (ref 70–99)
Glucose-Capillary: 168 mg/dL — ABNORMAL HIGH (ref 70–99)

## 2023-10-10 LAB — BPAM RBC
Blood Product Expiration Date: 202510252359
ISSUE DATE / TIME: 202509251000
Unit Type and Rh: 5100

## 2023-10-10 LAB — TYPE AND SCREEN
ABO/RH(D): O POS
Antibody Screen: NEGATIVE
Unit division: 0

## 2023-10-10 LAB — PHOSPHORUS: Phosphorus: 2.8 mg/dL (ref 2.5–4.6)

## 2023-10-10 LAB — HEPARIN LEVEL (UNFRACTIONATED)
Heparin Unfractionated: 0.28 [IU]/mL — ABNORMAL LOW (ref 0.30–0.70)
Heparin Unfractionated: 0.36 [IU]/mL (ref 0.30–0.70)
Heparin Unfractionated: 0.4 [IU]/mL (ref 0.30–0.70)

## 2023-10-10 LAB — MAGNESIUM: Magnesium: 2.1 mg/dL (ref 1.7–2.4)

## 2023-10-10 MED ORDER — ORAL CARE MOUTH RINSE: 15.0000 mL | OROMUCOSAL | Status: DC | PRN

## 2023-10-10 MED ORDER — TRAVASOL 10 % IV SOLN
INTRAVENOUS | Status: AC
  Filled 2023-10-10: qty 789.6

## 2023-10-10 MED ORDER — FUROSEMIDE 10 MG/ML IJ SOLN
40.0000 mg | Freq: Once | INTRAMUSCULAR | Status: AC
  Administered 2023-10-10: 40 mg via INTRAVENOUS
  Filled 2023-10-10: qty 4

## 2023-10-10 MED ORDER — ORAL CARE MOUTH RINSE
15.0000 mL | OROMUCOSAL | Status: DC
  Administered 2023-10-10 – 2023-10-11 (×4): 15 mL via OROMUCOSAL

## 2023-10-10 MED ORDER — DEXTROSE 5 % IV SOLN: INTRAVENOUS | Status: AC

## 2023-10-10 MED ORDER — POTASSIUM CHLORIDE 10 MEQ/100ML IV SOLN
10.0000 meq | INTRAVENOUS | Status: AC
Start: 2023-10-10 — End: 2023-10-10
  Administered 2023-10-10 (×6): 10 meq via INTRAVENOUS
  Filled 2023-10-10 (×4): qty 100

## 2023-10-10 NOTE — Progress Notes (Signed)
 [facility] Progress Note  3 Days Post-Op  Subjective: Thinks she is passing a small amount of flatus. NG output 1300 in last 24 hours.  Objective: Vital signs in last 24 hours: Temp:  [97.3 F (36.3 C)-98.4 F (36.9 C)] 98 F (36.7 C) (09/26 0834) Pulse Rate:  [53-77] 73 (09/26 0800) Resp:  [18-32] 22 (09/26 0800) BP: (114-188)/(42-153) 146/77 (09/26 0800) SpO2:  [91 %-100 %] 94 % (09/26 0800) Weight:  [54 kg] 54 kg (09/26 0500) Last BM Date : 09/29/23  Intake/Output from previous day: 09/25 0701 - 09/26 0700 In: 3927 [I.V.:2713.5; Blood:368; IV Piggyback:845.5] Out: 4425 [Urine:3125; Emesis/NG output:1300] Intake/Output this shift: No intake/output data recorded.  PE: Gen:  Alert, NAD.  NGT in place with bilious output.   Abd: Mildly distended but very soft and nontender. Midline incision clean and dry with staples in place, honeycomb dressing. Port sites clean and dry.     Lab Results:  Recent Labs    10/09/23 0357 10/09/23 1510 10/10/23 0500  WBC 26.5*  --  22.4*  HGB 8.0* 10.5* 10.2*  HCT 25.5* 33.2* 31.7*  PLT 197  --  221   BMET Recent Labs    10/09/23 0357 10/10/23 0500  NA 154* 149*  K 3.3* 3.1*  CL 118* 110  CO2 24 27  GLUCOSE 146* 141*  BUN 19 22  CREATININE 1.01* 0.98  CALCIUM 10.2 9.9   PT/INR No results for input(s): LABPROT, INR in the last 72 hours. CMP     Component Value Date/Time   NA 149 (H) 10/10/2023 0500   NA 139 01/26/2020 0000   NA 141 04/24/2012 0929   K 3.1 (L) 10/10/2023 0500   K 4.4 04/24/2012 0929   CL 110 10/10/2023 0500   CL 109 (H) 04/24/2012 0929   CO2 27 10/10/2023 0500   CO2 23 04/24/2012 0929   GLUCOSE 141 (H) 10/10/2023 0500   GLUCOSE 85 04/24/2012 0929   BUN 22 10/10/2023 0500   BUN 19 01/26/2020 0000   BUN 20.6 04/24/2012 0929   CREATININE 0.98 10/10/2023 0500   CREATININE 0.79 04/18/2023 1524   CREATININE 0.9 04/24/2012 0929   CALCIUM 9.9 10/10/2023 0500   CALCIUM 10.5 (H)  01/12/2020 2123   CALCIUM 10.0 04/24/2012 0929   PROT 5.6 (L) 10/10/2023 0500   PROT 6.7 04/24/2012 0929   ALBUMIN  3.6 10/10/2023 0500   ALBUMIN  3.5 04/24/2012 0929   AST 17 10/10/2023 0500   AST 20 04/24/2012 0929   ALT 10 10/10/2023 0500   ALT 12 04/24/2012 0929   ALKPHOS 81 10/10/2023 0500   ALKPHOS 75 04/24/2012 0929   BILITOT 0.4 10/10/2023 0500   BILITOT 0.36 04/24/2012 0929   GFRNONAA 57 (L) 10/10/2023 0500   GFRNONAA 58 (L) 05/11/2020 0700   GFRAA 68 05/11/2020 0700   Lipase     Component Value Date/Time   LIPASE 22 09/18/2013 1810       Studies/Results: DG CHEST PORT 1 VIEW Result Date: 10/08/2023 EXAM: 1 VIEW(S) XRAY OF THE CHEST 10/08/2023 01:43:00 PM COMPARISON: 10/07/2023 CLINICAL HISTORY: Encounter for assessment of peripherally inserted central catheter (PICC) [id number]. Encounter for PICC line placement. FINDINGS: LINES, TUBES AND DEVICES: Right upper extremity PICC repositioned. The tip now overlies right atrium. Stable enteric tube. LUNGS AND PLEURA: Bilateral hazy opacities. Small bilateral pleural effusions with associated atelectasis. No pulmonary edema. No pneumothorax. HEART AND MEDIASTINUM: No acute abnormality of the cardiac and mediastinal silhouettes. BONES AND SOFT TISSUES: No acute osseous  abnormality. IMPRESSION: 1. Right upper extremity PICC tip repositioned. The tip now overlies the right atrium. 2. Stable enteric tube. 3. Bilateral small pleural effusions with associated atelectasis. 4. Bilateral hazy pulmonary opacities likely reflect aggressive behavior edema Infection is not excluded. Electronically signed by: Lonni Necessary MD 10/08/2023 02:06 PM EDT RP Workstation: [id number]   DG Abd 1 View Result Date: 10/08/2023 CLINICAL DATA:  Nasogastric tube placement. EXAM: ABDOMEN - 1 VIEW COMPARISON:  10/07/2023 at 5:17 a.m. FINDINGS: Nasogastric tube has tip and side-port over the stomach in the left upper quadrant. Previously seen dilated air-filled  small bowel loops over the upper abdomen not present on the current exam. No free peritoneal air. Skin staples over the lower midline abdomen. Perihilar linear density likely atelectasis. Possible small amount left pleural fluid. Remainder of the exam is unchanged. IMPRESSION: 1. Nasogastric tube with tip and side-port over the stomach in the left upper quadrant. 2. Previously seen dilated air-filled small bowel loops over the upper abdomen not present on the current exam. Electronically Signed   By: Toribio Agreste M.D.   On: 10/08/2023 12:50     Anti-infectives: Anti-infectives (From admission, onward)    Start     Dose/Rate Route Frequency Ordered Stop   10/07/23 0915  cefoTEtan  (CEFOTAN ) 2 g in sodium chloride  0.9 % 100 mL IVPB        2 g 200 mL/hr over 30 Minutes Intravenous On call to O.R. 10/07/23 0820 10/07/23 1938        Assessment/Plan POD 3, s/p dx lap converted to ex lap with LOA and repair of enterotomy, Dr. Dasie 9/23 forHigh grade pSBO - Awaiting return of bowel function, continue NG to suction -PT/OT to mobilize -WBC downtrending today -pulm toilet, IS -multi-modal pain control - Continue TPN. Hypernatremia improved today.  FEN - NGT/NPO/TPN VTE - heparin  gtt    Per TR[facility] --  Dementia  GERD Tremors  HTN  Anxiety  A fib RVR - on amio gtt, heparin  gtt  Anemia   LOS: 9 days     Leonor LITTIE Dasie, MD  [facility] 10/10/23 9:03 AM

## 2023-10-10 NOTE — Progress Notes (Signed)
 Physical Therapy Treatment Patient Details Name: Tracey [name] MRN: [id number] DOB: 19-Dec-1940 Today's Date: 10/10/2023   History of Present Illness Patient is an 83 yo female admitted with SBO. On 10/07/2023 pt s/p diagnostic laparoscopy with LOA and repair of prior small bowel enterotomy. PMH: cognitive impairment, bipolar disorder, possible Parkinsons disease and h/o rection of GIST tumor, glaucoma, HTN    PT Comments  Pt agreeable to therapy, reports heart hurts and elaborated stating she felt  sad earlier today but excited to get up and move around with therapy. Pt needing min A with rolling and sidelying to sit, slow to mobilize, cues for hand placement, therapist managing all lines for safety. Once sitting pt powers to stand with CGA, again therapist managing all lines, pt denies dizziness, pain, SOB. Pt amb 150 ft with RW, slow cadence, shuffling steps to low foot clearance steps, trunk forward flexed, pulling RW to L requiring min A to maintain body within RW frame and maneuver RW around obstacles in hallway. Pt needing min A+2 for ambulation and chair follow for safety. Pt requests to remain up in recliner at EOS with all needs in reach. RN in room at beginning of session removing NG suction then returns to room at EOS to reconnect.    If plan is discharge home, recommend the following: A little help with walking and/or transfers;A little help with bathing/dressing/bathroom;Assistance with cooking/housework;Assist for transportation   Can travel by private vehicle     Yes  Equipment Recommendations  None recommended by PT    Recommendations for Other Services       Precautions / Restrictions Precautions Precautions: Fall Precaution/Restrictions Comments: NG tube, Recent ABD surgery Restrictions Weight Bearing Restrictions Per Provider Order: No     Mobility  Bed Mobility Overal bed mobility: Needs Assistance Bed Mobility: Rolling, Sidelying to Sit Rolling: Min assist,  Used rails Sidelying to sit: Min assist, Used rails       General bed mobility comments: min A to roll onto side and push up into sitting, through log roll for abdominal comfort, able to mobilize BLE, assistance at trunk    Transfers Overall transfer level: Needs assistance Equipment used: Rolling walker (2 wheels) Transfers: Sit to/from Stand Sit to Stand: Contact guard assist           General transfer comment: CGA for safety, no physical assistance, therapist managing all lines for safety    Ambulation/Gait Ambulation/Gait assistance: Min assist, +2 safety/equipment, +2 physical assistance Gait Distance (Feet): 150 Feet Assistive device: Rolling walker (2 wheels) Gait Pattern/deviations: Step-to pattern, Decreased stride length, Shuffle, Trunk flexed, Drifts right/left Gait velocity: decreased     General Gait Details: min A+2 for ambulation, pt with forward flexed trunk, short shuffling steps wtih minimal bil foot clearance, drifting L consistently requiring assistance to maintain body wtihin RW frame and RW within center of hallway, thearpist managing all lines, chair follow for safety, 1 standing rest break, HR up to 80s, denies dizziness, SOB or lightheadedness and no pain complaints   Stairs             Wheelchair Mobility     Tilt Bed    Modified Rankin (Stroke Patients Only)       Balance Overall balance assessment: Needs assistance Sitting-balance support: Feet supported Sitting balance-Leahy Scale: Fair     Standing balance support: Reliant on assistive device for balance, During functional activity, Bilateral upper extremity supported Standing balance-Leahy Scale: Poor  Communication Communication Communication: No apparent difficulties  Cognition Arousal: Alert Behavior During Therapy: WFL for tasks assessed/performed   PT - Cognitive impairments: History of cognitive impairments                        PT - Cognition Comments: pt pleasant, follows commands, self motivated to improve functional status Following commands: Intact      Cueing Cueing Techniques: Verbal cues  Exercises      General Comments        Pertinent Vitals/Pain Pain Assessment Pain Assessment: Faces Faces Pain Scale: Hurts a little bit Pain Location: L ear Pain Descriptors / Indicators:  (it's crazy in there) Pain Intervention(s): Limited activity within patient's tolerance, Monitored during session    Home Living                          Prior Function            PT Goals (current goals can now be found in the care plan section) Progress towards PT goals: Progressing toward goals    Frequency    Min 2X/week      PT Plan      Co-evaluation              AM-PAC PT 6 Clicks Mobility   Outcome Measure  Help needed turning from your back to your side while in a flat bed without using bedrails?: A Little Help needed moving from lying on your back to sitting on the side of a flat bed without using bedrails?: A Little Help needed moving to and from a bed to a chair (including a wheelchair)?: A Little Help needed standing up from a chair using your arms (e.g., wheelchair or bedside chair)?: A Little Help needed to walk in hospital room?: A Lot Help needed climbing 3-5 steps with a railing? : Total 6 Click Score: 15    End of Session Equipment Utilized During Treatment: Gait belt;Oxygen Activity Tolerance: Patient tolerated treatment well Patient left: in chair;with call bell/phone within reach;with chair alarm set Nurse Communication: Mobility status PT Visit Diagnosis: Muscle weakness (generalized) (M62.81);Other abnormalities of gait and mobility (R26.89);Unsteadiness on feet (R26.81);Other symptoms and signs involving the nervous system (R29.898)     Time: 8882-8856 PT Time Calculation (min) (ACUTE ONLY): 26 min  Charges:    $Gait Training: 8-22  mins $Therapeutic Activity: 8-22 mins PT General Charges $$ ACUTE PT VISIT: 1 Visit                     Tori Kailyn Dubie PT, DPT 10/10/23, 12:14 PM

## 2023-10-10 NOTE — Plan of Care (Signed)
  Problem: Education: Goal: Knowledge of General Education information will improve Description: Including pain rating scale, medication(s)/side effects and non-pharmacologic comfort measures Outcome: Progressing   Problem: Health Behavior/Discharge Planning: Goal: Ability to manage health-related needs will improve Outcome: Progressing   Problem: Clinical Measurements: Goal: Ability to maintain clinical measurements within normal limits will improve Outcome: Not Progressing   Problem: Nutrition: Goal: Adequate nutrition will be maintained Outcome: Progressing   Problem: Coping: Goal: Level of anxiety will decrease Outcome: Not Progressing

## 2023-10-10 NOTE — Progress Notes (Signed)
 PHARMACY - ANTICOAGULATION CONSULT NOTE  Pharmacy Consult for heparin   Indication: atrial fibrillation  Allergies  Allergen Reactions   Lisinopril Cough   Penicillins Itching and Other (See Comments)    50 years ago   Adhesive [Tape] Itching   Atorvastatin Itching   Dilaudid  [Hydromorphone  Hcl] Itching    Patient Measurements: Height: 5' 2 (157.5 cm) Weight: 54.1 kg (119 lb 4.3 oz) IBW/kg (Calculated) : 50.1 HEPARIN  DW (KG): 50.2  Vital Signs: Temp: 98.4 F (36.9 C) (09/25 2352) Temp Source: Axillary (09/25 2352) BP: 130/54 (09/26 0000) Pulse Rate: 71 (09/26 0015)  Labs: Recent Labs    10/07/23 0312 10/07/23 0313 10/08/23 0317 10/08/23 2346 10/09/23 0357 10/09/23 0852 10/09/23 1510 10/09/23 1806 10/09/23 2355  HGB 11.2*  --  9.4*  --  8.0*  --  10.5*  --   --   HCT 35.9*  --  29.3*  --  25.5*  --  33.2*  --   --   PLT 327  --  232  --  197  --   --   --   --   HEPARINUNFRC 0.75*  --   --    < >  --  0.24*  --  0.31 0.28*  CREATININE 0.94 0.94 1.11*  --  1.01*  --   --   --   --    < > = values in this interval not displayed.    Estimated Creatinine Clearance: 34 mL/min (A) (by C-G formula based on SCr of 1.01 mg/dL (H)).   Medical History: 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, possible    Tremors possible parkinsons   Manic disorder, single episode, in full remission 12/01/2009   Sixth nerve palsy      Medications:  No prior to admission anticoagulation meds listed  Assessment: Pharmacy consulted to dose heparin  in 83 yo F admitted with a high grade pSBO.  Pharmacy consulted to dose Heparin  for new onset Afib with RVR.  No prior to admission anticoagulation.    Today, 10/10/23 Heparin  level 0.28--subtherapeutic on heparin  900 units/hr No complications of therapy noted  Goal of Therapy:  Heparin  level 0.3-0.7 units/ml Monitor platelets by anticoagulation protocol: Yes   Plan:  Increase IV heparin  to 1000 units/hr Check 8h heparin  level after rate increase Daily heparin  level & CBC while on heparin  Continue to monitor for s/sx of bleeding  F/U long-term anticoagulation plans    Thank you for allowing pharmacy to be a part of this patient's care.  Arvin Gauss, PharmD Clinical Pharmacist  9/26/20251:06 AM

## 2023-10-10 NOTE — Progress Notes (Addendum)
 PROGRESS NOTE    Tracey [name]  FMW:[id number] DOB: Mar 22, 1940 DOA: 10/01/2023 PCP: Mast, Man X, NP    Chief Complaint  Patient presents with   Constipation    Brief Narrative:  Tracey [name] is an 83 yo female with PMH cognitive impairment, bipolar, possible Parkinson's, history of GIST who presented with abdominal pain and distension.  Initial CT A/P showed high-grade small bowel obstruction with transition point in the pelvis to the right of the midline and mild mesenteric edema.  She was treated supportively at first and NG tube was placed for decompression. Repeat CT was performed on 10/05/2023 showing ongoing dilated small bowel loops with no discernible contrast in the colon. She was eventually taken to the OR on 10/07/2023 for diagnostic laparoscopy with LOA and repair of prior small bowel enterotomy.   Assessment & Plan:   Principal Problem:   SBO (small bowel obstruction) (HCC) Active Problems:   Difficulty hearing   Essential (primary) hypertension   GERD (gastroesophageal reflux disease)   Major neurocognitive disorder (HCC)   Protein-calorie malnutrition, severe   New onset a-fib (HCC)   Hypotension   Dementia without behavioral disturbance (HCC)   Hypophosphatemia   Left ear pain   Acute postoperative anemia due to expected blood loss   Hypernatremia   Iron  deficiency anemia  #1 SBO -CT abdomen and pelvis done on admission consistent with high-grade small bowel obstruction with transition point in the pelvis to the right of the midline and mild mesenteric edema. - Status post NG tube placement for decompression. - Repeat CT scan done on 10/05/2023 with ongoing dilated small bowel loops with no discernible contrast in the colon. - Patient seen and consulted on by general surgery who followed the patient throughout the hospitalization. - Due to no improvement with conservative treatments patient was subsequently taken to the OR on 10/07/2023 for diagnostic laparoscopy  with LOA and repair of prior small bowel enterotomy. - Currently NG tube in place until bowel function returns per general surgery. - Patient started on TNA (10/08/2023 ) per general surgery. -Pulmonary toileting. - Per general surgery.  2.  Leukocytosis -With a leukocytosis noted on admission with WBC trending up currently at 22.4 K from 26.5 from 7.3. -Chest x-ray obtained 10/08/2023 negative for any acute infiltrate. -Urinalysis done nitrite negative, leukocytes negative -Patient afebrile. - Blood cultures pending with no growth to date.  -If worsening leukocytosis may need CT abdomen and pelvis to rule out abscess formation. -Per general surgery.  3.  Hypotension -Patient noted to be hypotensive during surgery. - Patient noted with a low MAP postoperatively which was downtrending and patient received a LR bolus - Status post IV albumin  x 2 days.   - Blood pressure has improved.   - Patient with some complaints of shortness of breath, crackles noted on examination on 10/09/2023 received Lasix  40 mg IV x 1 with urine output of 3.925 L over the past 24 hours. - Hypotension resolved. - Gentle hydration. - Patient on TPN.  4.  Postop acute blood loss anemia/severe iron  deficiency anemia - Patient with no overt GI bleed. -Status post transfusion 1 unit PRBCs. - Hemoglobin currently at 10.2 from 8.0 from 13.2 on admission -Anemia panel with iron <10, TIBC of 106, folate of > 20. - Status post IV iron . -Follow H&H. -  5.  A-fib with RVR -Currently in normal sinus rhythm with some bouts of bradycardia however asymptomatic. - Patient was seen by cardiology prior to surgery. - Patient was on  IV amiodarone  which has subsequently been discontinued. - Patient also placed on heparin  drip which was held and cleared by general surgery which was started on 10/08/2023.  - Per cardiology, once tolerating oral intake could start on amiodarone  100 mg daily and when okay with general surgery could  potentially switch to Eliquis p.o. - Cardiology was following but signed off on 10/07/2023. - Outpatient follow-up with cardiology.  6.  Severe protein calorie malnutrition -BMI of 20.24 kg/m. - Percent weight loss 11% in 6 months per RD. - Patient currently n.p.o. secondary to problem #1 while awaiting bowel function to return. - Patient started on TPN per general surgery recommendations.  7.  Dementia -Stable. - Delirium precautions.  8.  Volume depletion/dehydration/hypernatremia -Patient noted to be hypotensive postoperatively currently on IV fluids and IV albumin . -BP improved. -Patient with worsening hypernatremia with sodium at 154 on 10/09/2023 which is trending down currently down to 149 after being started on D5W. - Continue D5W for another 24 hours.  9. hypertension -Noted to be hypotensive, BP improved.. - Follow.  10.  Hypophosphatemia/hypokalemia - Phosphorus at 2.8. -Potassium at 3.1. - Patient on TPN and electrolytes being repleted per pharmacy.  11.  Left ear pain -Concern for otitis externa. - Continue Ciprodex  otic 4 drops to the left ear twice daily x 5 days.    DVT prophylaxis: Heparin  Code Status: DNR Family Communication: Updated patient.  No family at bedside. Disposition: Remain in stepdown unit  Status is: Inpatient Remains inpatient appropriate because: Severity of illness   Consultants:  General Surgery: Dr. Debby 10/01/2023 Cardiology: Dr. Levern 10/05/2023  Procedures:  PICC line placement 10/07/2023 PICC line exchange 10/08/2023 CT abdomen pelvis 10/01/2023 2D echo 10/04/2023 CT chest abdomen and pelvis 10/05/2023 Small bowel protocol 10/02/2023 Diagnostic laparoscopy, ex lap with LOA, primary repair of small bowel enterotomy per general surgery: Dr. Dasie 10/07/2023   Antimicrobials:  Anti-infectives (From admission, onward)    Start     Dose/Rate Route Frequency Ordered Stop   10/07/23 0915  cefoTEtan  (CEFOTAN ) 2 g in sodium  chloride 0.9 % 100 mL IVPB        2 g 200 mL/hr over 30 Minutes Intravenous On call to O.R. 10/07/23 0820 10/07/23 1938         Subjective: Patient lying in bed.  NG tube in place with bilious drainage.  Patient with some complaints of shortness of breath.  Denies any chest pain.  Complains of abdominal pain.  No nausea or emesis.  No bowel movement.  Unsure whether she has had some flatus.  Per RN patient with some sundowning overnight.   Objective: Vitals:   10/10/23 0800 10/10/23 0834 10/10/23 0900 10/10/23 1000  BP: (!) 146/77  (!) 156/71 (!) 152/68  Pulse: 73 73 (!) 57 61  Resp: (!) 22 (!) 21 (!) 23 (!) 22  Temp:  98 F (36.7 C)    TempSrc:  Oral    SpO2: 94% 96% 94% 97%  Weight:      Height:        Intake/Output Summary (Last 24 hours) at 10/10/2023 1035 Last data filed at 10/10/2023 0600 Gross per 24 hour  Intake 3926.98 ml  Output 4425 ml  Net -498.02 ml   Filed Weights   10/08/23 0500 10/09/23 0404 10/10/23 0500  Weight: 57.5 kg 54.1 kg 54 kg    Examination:  General exam: NAD.  NG tube in place with bilious drainage.  Respiratory system: Crackles noted on L > R.  No rhonchi.  No wheezing.  Fair air movement.  Speaking in full sentences.   Cardiovascular system: RRR no murmurs rubs or gallops.  No JVD.  No lower extremity edema.  Gastrointestinal system: Abdomen is soft, mildly distended, diffuse tenderness to palpation, hypoactive bowel sounds.  No rebound.  No guarding.  Honeycomb dressing in place. Central nervous system: Alert and oriented. No focal neurological deficits. Extremities: Symmetric 5 x 5 power. Skin: No rashes, lesions or ulcers Psychiatry: Judgement and insight appear normal. Mood & affect appropriate.     Data Reviewed: I have personally reviewed following labs and imaging studies  CBC: Recent Labs  Lab 10/04/23 0540 10/05/23 0309 10/06/23 0300 10/07/23 0312 10/08/23 0317 10/09/23 0357 10/09/23 1510 10/10/23 0500  WBC 6.1 5.4  6.5 7.3 22.2* 26.5*  --  22.4*  NEUTROABS 4.0 3.0  --   --   --   --   --  20.0*  HGB 12.5 11.9* 12.6 11.2* 9.4* 8.0* 10.5* 10.2*  HCT 42.1 39.4 40.5 35.9* 29.3* 25.5* 33.2* 31.7*  MCV 104.7* 103.4* 104.4* 104.1* 103.5* 104.5*  --  99.7  PLT 310 314 365 327 232 197  --  221    Basic Metabolic Panel: Recent Labs  Lab 10/06/23 0300 10/06/23 1050 10/07/23 0312 10/07/23 0313 10/08/23 0317 10/09/23 0357 10/10/23 0500  NA 153*   < > 145 144 146* 154* 149*  K 4.1   < > 3.9 3.8 4.2 3.3* 3.1*  CL 118*   < > 112* 112* 114* 118* 110  CO2 25   < > 23 23 22 24 27   GLUCOSE 151*   < > 130* 135* 105* 146* 141*  BUN 19   < > 17 17 16 19 22   CREATININE 0.88   < > 0.94 0.94 1.11* 1.01* 0.98  CALCIUM 9.8   < > 9.3 9.5 9.4 10.2 9.9  MG 2.5*  --  2.2  --  1.9 2.2 2.1  PHOS 1.8*   < > 2.4* 2.3* 4.0 2.4* 2.8   < > = values in this interval not displayed.    GFR: Estimated Creatinine Clearance: 35 mL/min (by C-G formula based on SCr of 0.98 mg/dL).  Liver Function Tests: Recent Labs  Lab 10/06/23 2216 10/07/23 0313 10/08/23 0317 10/09/23 0357 10/10/23 0500  AST  --   --  17 17 17   ALT  --   --  8 6 10   ALKPHOS  --   --  36* 100 81  BILITOT  --   --  0.4 0.4 0.4  PROT  --   --  4.8* 5.3* 5.6*  ALBUMIN  2.9* 2.9* 3.5 3.8 3.6    CBG: Recent Labs  Lab 10/09/23 1547 10/09/23 2034 10/09/23 2337 10/10/23 0314 10/10/23 0802  GLUCAP 144* 114* 158* 116* 136*     Recent Results (from the past 240 hours)  Culture, blood (Routine X 2) w Reflex to ID Panel     Status: None   Collection Time: 10/04/23  1:10 PM   Specimen: BLOOD LEFT ARM  Result Value Ref Range Status   Specimen Description   Final    BLOOD LEFT ARM Performed at [facility], [address], [place], [place] [address]    Special Requests   Final    BOTTLES DRAWN AEROBIC ONLY Blood Culture results may not be optimal due to an inadequate volume of blood received in culture bottles Performed at [facility], [address]  [place], [place] [address]    Culture   Final    NO GROWTH 5 DAYS Performed at [facility], [address] [place], [place] [address]    Report Status 10/09/2023 FINAL  Final  Culture, blood (Routine X 2) w Reflex to ID Panel     Status: None   Collection Time: 10/04/23  1:10 PM   Specimen: BLOOD LEFT HAND  Result Value Ref Range Status   Specimen Description   Final    BLOOD LEFT HAND Performed at [facility], [address] [place], [place] [address]    Special Requests   Final    BOTTLES DRAWN AEROBIC AND ANAEROBIC Blood Culture results may not be optimal due to an inadequate volume of blood received in culture bottles Performed at [facility], [address], [place], [place] [address]    Culture   Final    NO GROWTH 5 DAYS Performed at [facility], [address], [place], [place] [address]    Report Status 10/09/2023 FINAL  Final  MRSA Next Gen by PCR, Nasal     Status: None   Collection Time: 10/04/23  2:48 PM   Specimen: Nasal Mucosa; Nasal Swab  Result Value Ref Range Status   MRSA by PCR Next Gen NOT DETECTED NOT DETECTED Final    Comment: (NOTE) The GeneXpert MRSA Assay (FDA approved for NASAL specimens only), is one component of a comprehensive MRSA colonization surveillance program. It is not intended to diagnose MRSA infection nor to guide or monitor treatment for MRSA infections. Test performance is not FDA approved in patients less than 74 years old. Performed at [facility], [address] [place], [place] [address]   Urine Culture (for pregnant, neutropenic or urologic patients or patients with an indwelling urinary catheter)     Status: None   Collection Time: 10/08/23  3:00 PM   Specimen: Urine, Clean Catch  Result Value Ref Range Status   Specimen Description   Final    URINE, CLEAN CATCH Performed at [facility], [address],  [place], [place] [address]    Special Requests   Final    NONE Performed at [facility], [address], [place], [place] [address]    Culture   Final    NO GROWTH Performed at [facility], [address], [place], [place] [address]    Report Status 10/09/2023 FINAL  Final  Culture, blood (Routine X 2) w Reflex to ID Panel     Status: None (Preliminary result)   Collection Time: 10/09/23  8:52 AM   Specimen: BLOOD  Result Value Ref Range Status   Specimen Description   Final    BLOOD BLOOD LEFT ARM AEROBIC BOTTLE ONLY Performed at [facility], [address] [place], [place] [address]    Special Requests   Final    BOTTLES DRAWN AEROBIC ONLY Blood Culture results may not be optimal due to an inadequate volume of blood received in culture bottles Performed at [facility], [address] [place], [place] [address]    Culture   Final    NO GROWTH < 24 HOURS Performed at [facility], [address] [place], [place] [address]    Report Status PENDING  Incomplete  Culture, blood (Routine X 2) w Reflex to ID Panel     Status: None (Preliminary result)   Collection  Time: 10/09/23  9:09 AM   Specimen: BLOOD  Result Value Ref Range Status   Specimen Description   Final    BLOOD BLOOD LEFT ARM Performed at [facility], [address], [place], [place] [address]    Special Requests   Final    BOTTLES DRAWN AEROBIC ONLY Blood Culture results may not be optimal due to an inadequate volume of blood received in culture bottles Performed at [facility], [address], [place], [place] [address]    Culture   Final    NO GROWTH < 24 HOURS Performed at [facility], [address], [place], [place] [address]    Report Status PENDING  Incomplete         Radiology Studies: DG CHEST PORT 1 VIEW Result Date: 10/08/2023 EXAM: 1 VIEW(S) XRAY OF THE CHEST 10/08/2023 01:43:00  PM COMPARISON: 10/07/2023 CLINICAL HISTORY: Encounter for assessment of peripherally inserted central catheter (PICC) [id number]. Encounter for PICC line placement. FINDINGS: LINES, TUBES AND DEVICES: Right upper extremity PICC repositioned. The tip now overlies right atrium. Stable enteric tube. LUNGS AND PLEURA: Bilateral hazy opacities. Small bilateral pleural effusions with associated atelectasis. No pulmonary edema. No pneumothorax. HEART AND MEDIASTINUM: No acute abnormality of the cardiac and mediastinal silhouettes. BONES AND SOFT TISSUES: No acute osseous abnormality. IMPRESSION: 1. Right upper extremity PICC tip repositioned. The tip now overlies the right atrium. 2. Stable enteric tube. 3. Bilateral small pleural effusions with associated atelectasis. 4. Bilateral hazy pulmonary opacities likely reflect aggressive behavior edema Infection is not excluded. Electronically signed by: Lonni Necessary MD 10/08/2023 02:06 PM EDT RP Workstation: [id number]   DG Abd 1 View Result Date: 10/08/2023 CLINICAL DATA:  Nasogastric tube placement. EXAM: ABDOMEN - 1 VIEW COMPARISON:  10/07/2023 at 5:17 a.m. FINDINGS: Nasogastric tube has tip and side-port over the stomach in the left upper quadrant. Previously seen dilated air-filled small bowel loops over the upper abdomen not present on the current exam. No free peritoneal air. Skin staples over the lower midline abdomen. Perihilar linear density likely atelectasis. Possible small amount left pleural fluid. Remainder of the exam is unchanged. IMPRESSION: 1. Nasogastric tube with tip and side-port over the stomach in the left upper quadrant. 2. Previously seen dilated air-filled small bowel loops over the upper abdomen not present on the current exam. Electronically Signed   By: Toribio Agreste M.D.   On: 10/08/2023 12:50        Scheduled Meds:  acetaminophen   650 mg Oral Once   Chlorhexidine  Gluconate Cloth  6 each Topical Daily   ciprofloxacin -dexamethasone    4 drop Left EAR BID   cycloSPORINE   1 drop Both Eyes BID   insulin  aspart  0-15 Units Subcutaneous Q4H   latanoprost   1 drop Both Eyes QHS   lidocaine   1 patch Transdermal Q24H   sodium chloride  flush  10-40 mL Intracatheter Q12H   thiamine  (VITAMIN B1) injection  100 mg Intravenous Q24H   timolol   1 drop Both Eyes Daily   Continuous Infusions:  dextrose  75 mL/hr at 10/10/23 0600   dextrose      heparin  1,000 Units/hr (10/10/23 0600)   potassium chloride  Stopped (10/10/23 1016)   TPN ADULT (ION) 40 mL/hr at 10/10/23 0600   TPN ADULT (ION)       LOS: 9 days    Time spent: 45 minutes    Toribio Hummer, MD [facility]   To contact the attending provider between 7A-7P or the covering provider  during after hours 7P-7A, please log into the web site [email or web address] and access using universal [place] password for that web site. If you do not have the password, please call the hospital operator.  10/10/2023, 10:35 AM

## 2023-10-10 NOTE — Progress Notes (Signed)
 PHARMACY - ANTICOAGULATION CONSULT NOTE  Pharmacy Consult for heparin   Indication: atrial fibrillation  Allergies  Allergen Reactions   Lisinopril Cough   Penicillins Itching and Other (See Comments)    50 years ago   Adhesive [Tape] Itching   Atorvastatin Itching   Dilaudid  [Hydromorphone  Hcl] Itching    Patient Measurements: Height: 5' 2 (157.5 cm) Weight: 54 kg (119 lb 0.8 oz) IBW/kg (Calculated) : 50.1 HEPARIN  DW (KG): 50.2  Vital Signs: Temp: 98 F (36.7 C) (09/26 1502) Temp Source: Oral (09/26 1502) BP: 150/87 (09/26 1700) Pulse Rate: 93 (09/26 1700)  Labs: Recent Labs    10/08/23 0317 10/08/23 2346 10/09/23 0357 10/09/23 0852 10/09/23 1510 10/09/23 1806 10/09/23 2355 10/10/23 0500 10/10/23 0930  HGB 9.4*  --  8.0*  --  10.5*  --   --  10.2*  --   HCT 29.3*  --  25.5*  --  33.2*  --   --  31.7*  --   PLT 232  --  197  --   --   --   --  221  --   HEPARINUNFRC  --    < >  --    < >  --  0.31 0.28*  --  0.36  CREATININE 1.11*  --  1.01*  --   --   --   --  0.98  --    < > = values in this interval not displayed.    Estimated Creatinine Clearance: 35 mL/min (by C-G formula based on SCr of 0.98 mg/dL).   Medical History: 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, possible    Tremors possible parkinsons   Manic disorder, single episode, in full remission 12/01/2009   Sixth nerve palsy     Medications:   No prior to admission anticoagulation meds listed  Assessment: Pharmacy consulted to dose heparin  in 83 yo F admitted with a high grade pSBO.  Pharmacy consulted to dose Heparin  for new onset Afib with RVR. No prior to admission anticoagulation.    Today, 10/10/23 PM confirmatory heparin  level 0.4 - therapeutic on heparin  1000 units/hr Hgb 10.2, plts 221 s/p 1 unit PRBC yesterday--stable No complications of therapy documented per RN  Goal of Therapy:  Heparin  level 0.3-0.7 units/ml Monitor platelets by anticoagulation protocol: Yes   Plan:  Continue IV heparin  at 1000 units/hr Daily heparin  level & CBC while on heparin  Continue to monitor for s/sx of bleeding  F/U long-term anticoagulation plans pending surgery clearance   Thank you for allowing pharmacy to be a part of this patient's care.  Marget Hench, PharmD Clinical Pharmacist 9/26/20255:57 PM

## 2023-10-10 NOTE — TOC Progression Note (Signed)
 Transition of Care [facility]) - Progression Note    Patient Details  Name: Tracey [name] MRN: [id number] Date of Birth: 1940/11/28  Transition of Care [facility]) CM/SW Contact  Jon ONEIDA Anon, RN Phone Number: 10/10/2023, 12:55 PM  Clinical Narrative:    Pt is with NG tube in place, not medically stable for discharge at this time. PT/OT recommending SNF at DC. NCM spoke with Lonell at [facility] and she states once pt is medically stable, she will have a ready SNF bed. ICM continuing to follow.   Expected Discharge Plan: Skilled Nursing Facility Barriers to Discharge: Continued Medical Work up               Expected Discharge Plan and Services   Discharge Planning Services: CM Consult Post Acute Care Choice: Skilled Nursing Facility Living arrangements for the past 2 months: Assisted Living Facility                                       Social Drivers of Health (SDOH) Interventions SDOH Screenings   Food Insecurity: No Food Insecurity (10/02/2023)  Housing: Low Risk  (10/02/2023)  Transportation Needs: No Transportation Needs (10/02/2023)  Utilities: Not At Risk (10/02/2023)  Depression (PHQ2-9): Low Risk  (10/02/2023)  Social Connections: Moderately Integrated (10/02/2023)  Tobacco Use: Low Risk  (10/07/2023)    Readmission Risk Interventions     No data to display

## 2023-10-10 NOTE — Telephone Encounter (Signed)
 Patient Product/process development scientist completed.    The patient is insured through [employer]. Patient has Medicare and is not eligible for a copay card, but may be able to apply for patient assistance or Medicare RX Payment Plan (Patient Must reach out to their plan, if eligible for payment plan), if available.    Ran test claim for Eliquis 5 mg and the current 30 day co-pay is $40.00.  Ran test claim for Xarelto 20 mg and the current 30 day co-pay is $40.00.  This test claim was processed through [facility]- copay amounts may vary at other pharmacies due to pharmacy/plan contracts, or as the patient moves through the different stages of their insurance plan.     Roland Earl, CPHT Pharmacy Technician III Certified Patient Advocate [facility] Pharmacy Patient Advocate Team Direct Number: [phone]  Fax: [phone]

## 2023-10-10 NOTE — Progress Notes (Signed)
 PHARMACY - ANTICOAGULATION CONSULT NOTE  Pharmacy Consult for heparin   Indication: atrial fibrillation  Allergies  Allergen Reactions   Lisinopril Cough   Penicillins Itching and Other (See Comments)    50 years ago   Adhesive [Tape] Itching   Atorvastatin Itching   Dilaudid  [Hydromorphone  Hcl] Itching    Patient Measurements: Height: 5' 2 (157.5 cm) Weight: 54 kg (119 lb 0.8 oz) IBW/kg (Calculated) : 50.1 HEPARIN  DW (KG): 50.2  Vital Signs: Temp: 98 F (36.7 C) (09/26 0834) Temp Source: Oral (09/26 0834) BP: 146/77 (09/26 0800) Pulse Rate: 73 (09/26 0800)  Labs: Recent Labs    10/08/23 0317 10/08/23 2346 10/09/23 0357 10/09/23 0852 10/09/23 1510 10/09/23 1806 10/09/23 2355 10/10/23 0500 10/10/23 0930  HGB 9.4*  --  8.0*  --  10.5*  --   --  10.2*  --   HCT 29.3*  --  25.5*  --  33.2*  --   --  31.7*  --   PLT 232  --  197  --   --   --   --  221  --   HEPARINUNFRC  --    < >  --    < >  --  0.31 0.28*  --  0.36  CREATININE 1.11*  --  1.01*  --   --   --   --  0.98  --    < > = values in this interval not displayed.    Estimated Creatinine Clearance: 35 mL/min (by C-G formula based on SCr of 0.98 mg/dL).   Medical History: 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, possible    Tremors possible parkinsons   Manic disorder, single episode, in full remission 12/01/2009   Sixth nerve palsy     Medications:   No prior to admission anticoagulation meds listed  Assessment: Pharmacy consulted to dose heparin  in 83 yo F admitted with a high grade pSBO.  Pharmacy consulted to dose Heparin  for new onset Afib with RVR. No prior to admission anticoagulation.    Today, 10/10/23 Heparin  level 0.36--now therapeutic on heparin  1000 units/hr Hgb 10.2, plts 221 s/p 1 unit PRBC yesterday--stable No complications of therapy documented   Goal of Therapy:  Heparin  level 0.3-0.7 units/ml Monitor platelets by anticoagulation protocol: Yes   Plan:  Continue IV heparin  at 1000 units/hr Check 8h confirmatory heparin  level Daily heparin  level & CBC while on heparin  Continue to monitor for s/sx of bleeding  F/U long-term anticoagulation plans - copay checks pending   Thank you for allowing pharmacy to be a part of this patient's care.  Lacinda Moats, PharmD Clinical Pharmacist  9/26/202510:12 AM

## 2023-10-10 NOTE — Progress Notes (Signed)
 PHARMACY - TOTAL PARENTERAL NUTRITION CONSULT NOTE   Indication: Small bowel obstruction  Patient Measurements: Height: 5' 2 (157.5 cm) Weight: 54 kg (119 lb 0.8 oz) IBW/kg (Calculated) : 50.1 TPN AdjBW (KG): 50.2 Body mass index is 21.77 kg/m.  Assessment:  83 YO female presenting 9/17 with abdominal pain and distension. Initial CT A/P showed high-grade small bowel obstruction with transition point in the pelvis to the right of the midline and mild mesenteric edema, repeat CT continued to show ongoing dilated small bowel loops. Patient taken to OR 9/23 for diagnostic laparoscopy with LOA and repair of prior small bowel enterotomy. NGT in place. Pharmacy consulted for TPN management in the setting of SBO.   Glucose / Insulin :  CBGs 114-167 since starting TPN yesterday 10 unit of SSI given D5W @75ml /hr running since yesterday AM Electrolytes: Na elevated but improved at 149, K low at 3.1, all others WNL Renal: Scr, BUN WNL Hepatic: All WNL Intake / Output; MIVF:  UOP: -3.1 L/24hrs  NG output: -1.3 L/24hrs D5W @75  ml/hr, per MD GI Imaging: - 9/17 a/p: High-grade small bowel obstruction with transition point in the pelvis to the right of midline - 9/19 DG abd: SBO pattern with no improvement since the prior CT - 9/21 CT chest/a/p: Small bowel loops remain dilated in the abdomen and pelvis. There is some diluted contrast in the dilated small bowel loops, but no discernible contrast in the colonic lumen. The degree of small-bowel dilatation appears stable to mildly progressive in the interval - 9/23 DG abd: persistent SBO with mild improvement GI Surgeries / Procedures:  - 9/23: Diagnostic laparoscopy, exploratory laparotomy with lysis of adhesions, primary repair of small bowel enterotomy  Central access: PICC obtained 9/23 TPN start date: 9/24  Nutritional Goals: Goal TPN rate is 70 mL/hr (provides 79 g of protein and 1608 kcals per day)  RD Assessment: Estimated Needs Total  Energy Estimated Needs: 1500-1700 kcals Total Protein Estimated Needs: 75-90 grams Total Fluid Estimated Needs: >/= 1.5L  Current Nutrition:  NPO and TPN  Plan:  Now: Give IV KCl 10mEq x6 runs  At 1800: Increase TPN to goal rate of 88mL/hr Will adjust the dextrose  concentration to 12% in TPN given the D5W rate decrease (will provide total 255g of dextrose  daily from both sources). Without the D5W infusion, dextrose  concentration should be 15% in TPN to provide total 252g of dextrose  daily. Electrolytes in TPN:  Na 41mEq/L K 50mEq/L Ca 65mEq/L Mg 77mEq/L Phos 15mmol/L Cl:Ac max acetate Add standard MVI and trace elements to TPN Continue Moderate q4h SSI and adjust as needed  Decrease D5W to 45mL/hr x24hrs Thiamine  100mg  IV daily x5 days (thru 9/29) as recommended by RD Monitor TPN labs on Mon/Thurs. Daily CMP/Mg/Phos thru Sunday    Lacinda Moats, PharmD Clinical Pharmacist  9/26/20257:03 AM

## 2023-10-11 DIAGNOSIS — I4891 Unspecified atrial fibrillation: Secondary | ICD-10-CM | POA: Diagnosis not present

## 2023-10-11 DIAGNOSIS — I10 Essential (primary) hypertension: Secondary | ICD-10-CM | POA: Diagnosis not present

## 2023-10-11 DIAGNOSIS — I959 Hypotension, unspecified: Secondary | ICD-10-CM | POA: Diagnosis not present

## 2023-10-11 DIAGNOSIS — K56609 Unspecified intestinal obstruction, unspecified as to partial versus complete obstruction: Secondary | ICD-10-CM | POA: Diagnosis not present

## 2023-10-11 LAB — COMPREHENSIVE METABOLIC PANEL WITH GFR
ALT: 8 U/L (ref 0–44)
AST: 15 U/L (ref 15–41)
Albumin: 3.4 g/dL — ABNORMAL LOW (ref 3.5–5.0)
Alkaline Phosphatase: 114 U/L (ref 38–126)
Anion gap: 11 (ref 5–15)
BUN: 27 mg/dL — ABNORMAL HIGH (ref 8–23)
CO2: 28 mmol/L (ref 22–32)
Calcium: 9.9 mg/dL (ref 8.9–10.3)
Chloride: 106 mmol/L (ref 98–111)
Creatinine, Ser: 0.86 mg/dL (ref 0.44–1.00)
GFR, Estimated: >60 mL/min (ref >60)
Glucose, Bld: 148 mg/dL — ABNORMAL HIGH (ref 70–99)
Potassium: 3.7 mmol/L (ref 3.5–5.1)
Sodium: 145 mmol/L (ref 135–145)
Total Bilirubin: 0.3 mg/dL (ref 0.0–1.2)
Total Protein: 5.7 g/dL — ABNORMAL LOW (ref 6.5–8.1)

## 2023-10-11 LAB — CBC WITH DIFFERENTIAL/PLATELET
Abs Immature Granulocytes: 0.19 K/uL — ABNORMAL HIGH (ref 0.00–0.07)
Basophils Absolute: 0 K/uL (ref 0.0–0.1)
Basophils Relative: 0 %
Eosinophils Absolute: 0.2 K/uL (ref 0.0–0.5)
Eosinophils Relative: 2 %
HCT: 35.1 % — ABNORMAL LOW (ref 36.0–46.0)
Hemoglobin: 10.8 g/dL — ABNORMAL LOW (ref 12.0–15.0)
Immature Granulocytes: 2 %
Lymphocytes Relative: 16 %
Lymphs Abs: 1.6 K/uL (ref 0.7–4.0)
MCH: 30.9 pg (ref 26.0–34.0)
MCHC: 30.8 g/dL (ref 30.0–36.0)
MCV: 100.3 fL — ABNORMAL HIGH (ref 80.0–100.0)
Monocytes Absolute: 0.3 K/uL (ref 0.1–1.0)
Monocytes Relative: 3 %
Neutro Abs: 7.7 K/uL (ref 1.7–7.7)
Neutrophils Relative %: 77 %
Platelets: 202 K/uL (ref 150–400)
RBC: 3.5 MIL/uL — ABNORMAL LOW (ref 3.87–5.11)
RDW: 16.1 % — ABNORMAL HIGH (ref 11.5–15.5)
WBC: 10 K/uL (ref 4.0–10.5)
nRBC: 0 % (ref 0.0–0.2)

## 2023-10-11 LAB — GLUCOSE, CAPILLARY
Glucose-Capillary: 132 mg/dL — ABNORMAL HIGH (ref 70–99)
Glucose-Capillary: 138 mg/dL — ABNORMAL HIGH (ref 70–99)
Glucose-Capillary: 138 mg/dL — ABNORMAL HIGH (ref 70–99)
Glucose-Capillary: 147 mg/dL — ABNORMAL HIGH (ref 70–99)
Glucose-Capillary: 173 mg/dL — ABNORMAL HIGH (ref 70–99)

## 2023-10-11 LAB — MAGNESIUM: Magnesium: 2.2 mg/dL (ref 1.7–2.4)

## 2023-10-11 LAB — PHOSPHORUS: Phosphorus: 2.3 mg/dL — ABNORMAL LOW (ref 2.5–4.6)

## 2023-10-11 LAB — HEPARIN LEVEL (UNFRACTIONATED): Heparin Unfractionated: 0.46 [IU]/mL (ref 0.30–0.70)

## 2023-10-11 MED ORDER — ORAL CARE MOUTH RINSE: 15.0000 mL | OROMUCOSAL | Status: DC | PRN

## 2023-10-11 MED ORDER — POTASSIUM PHOSPHATES 15 MMOLE/5ML IV SOLN
15.0000 mmol | Freq: Once | INTRAVENOUS | Status: AC
  Administered 2023-10-11: 15 mmol via INTRAVENOUS
  Filled 2023-10-11: qty 5

## 2023-10-11 MED ORDER — INSULIN ASPART 100 UNIT/ML IJ SOLN
0.0000 [IU] | Freq: Four times a day (QID) | INTRAMUSCULAR | Status: DC
  Administered 2023-10-11 – 2023-10-13 (×8): 2 [IU] via SUBCUTANEOUS

## 2023-10-11 MED ORDER — TRAVASOL 10 % IV SOLN
INTRAVENOUS | Status: AC
  Filled 2023-10-11: qty 889.2

## 2023-10-11 NOTE — Progress Notes (Signed)
 4 Days Post-Op   Subjective/Chief Complaint: No flatus, feels miserable   Objective: Vital signs in last 24 hours: Temp:  [97.7 F (36.5 C)-98.2 F (36.8 C)] 97.7 F (36.5 C) (09/27 0350) Pulse Rate:  [57-94] 77 (09/27 0700) Resp:  [16-37] 17 (09/27 0700) BP: (86-160)/(54-113) 106/60 (09/27 0700) SpO2:  [89 %-98 %] 95 % (09/27 0700) Last BM Date : 09/29/23  Intake/Output from previous day: 09/26 0701 - 09/27 0700 In: 3312.7 [I.V.:2984.4; IV Piggyback:328.3] Out: 4025 [Urine:2300; Emesis/NG output:1725] Intake/Output this shift: No intake/output data recorded.  General nd, tired, ng with bilious output CV regular Ab approp tender dressing clean, soft nondistended  Lab Results:  Recent Labs    10/10/23 0500 10/11/23 0401  WBC 22.4* 10.0  HGB 10.2* 10.8*  HCT 31.7* 35.1*  PLT 221 202   BMET Recent Labs    10/10/23 0500 10/11/23 0401  NA 149* 145  K 3.1* 3.7  CL 110 106  CO2 27 28  GLUCOSE 141* 148*  BUN 22 27*  CREATININE 0.98 0.86  CALCIUM 9.9 9.9   PT/INR No results for input(s): LABPROT, INR in the last 72 hours. ABG No results for input(s): PHART, HCO3 in the last 72 hours.  Invalid input(s): PCO2, PO2  Studies/Results: No results found.  Anti-infectives: Anti-infectives (From admission, onward)    Start     Dose/Rate Route Frequency Ordered Stop   10/07/23 0915  cefoTEtan  (CEFOTAN ) 2 g in sodium chloride  0.9 % 100 mL IVPB        2 g 200 mL/hr over 30 Minutes Intravenous On call to O.R. 10/07/23 0820 10/07/23 1938       Assessment/Plan: POD 4 s/p dx lap converted to ex lap with LOA and repair of enterotomy, Dr. Dasie 9/23 forHigh grade pSBO - Awaiting return of bowel function, continue NG to suction-output still high and no flatu -PT/OT to mobilize-needs to be oob today again -WBC norma -pulm toilet, IS -multi-modal pain control  FEN - NGT/NPO/TPN VTE - heparin  gtt      Per [facility] --  Dementia  GERD Tremors  HTN   Anxiety  A fib RVR - on amio gtt, heparin  gtt  Anemia  Donnice Bury 10/11/2023

## 2023-10-11 NOTE — Plan of Care (Signed)
  Problem: Nutrition: Goal: Adequate nutrition will be maintained Outcome: Progressing   Problem: Elimination: Goal: Will not experience complications related to urinary retention Outcome: Progressing   Problem: Pain Managment: Goal: General experience of comfort will improve and/or be controlled Outcome: Progressing   Problem: Coping: Goal: Level of anxiety will decrease Outcome: Not Progressing

## 2023-10-11 NOTE — Progress Notes (Signed)
 PHARMACY - ANTICOAGULATION CONSULT NOTE  Pharmacy Consult for heparin   Indication: atrial fibrillation  Allergies  Allergen Reactions   Lisinopril Cough   Penicillins Itching and Other (See Comments)    50 years ago   Adhesive [Tape] Itching   Atorvastatin Itching   Dilaudid  [Hydromorphone  Hcl] Itching    Patient Measurements: Height: 5' 2 (157.5 cm) Weight: 54 kg (119 lb 0.8 oz) IBW/kg (Calculated) : 50.1 HEPARIN  DW (KG): 50.2  Vital Signs: Temp: 97.7 F (36.5 C) (09/27 0350) Temp Source: Axillary (09/27 0350) BP: 106/60 (09/27 0700) Pulse Rate: 77 (09/27 0700)  Labs: Recent Labs    10/09/23 0357 10/09/23 0852 10/09/23 1510 10/09/23 1806 10/10/23 0500 10/10/23 0930 10/10/23 1706 10/11/23 0401  HGB 8.0*  --  10.5*  --  10.2*  --   --  10.8*  HCT 25.5*  --  33.2*  --  31.7*  --   --  35.1*  PLT 197  --   --   --  221  --   --  202  HEPARINUNFRC  --    < >  --    < >  --  0.36 0.40 0.46  CREATININE 1.01*  --   --   --  0.98  --   --  0.86   < > = values in this interval not displayed.    Estimated Creatinine Clearance: 39.9 mL/min (by C-G formula based on SCr of 0.86 mg/dL).   Medications:  No prior to admission anticoagulation meds listed  Assessment: 83 yo F admitted with a high grade pSBO.  Pharmacy consulted to dose Heparin  for new onset Afib with RVR. No prior to admission anticoagulation.    Today, 10/11/23 Daily heparin  level remains therapeutic and stable on 1000 units/hr Hgb slightly low but stable; Plt stable WNL SCr stable <1.0 No bleeding or infusion issues per RN  Goal of Therapy:  Heparin  level 0.3-0.7 units/ml Monitor platelets by anticoagulation protocol: Yes   Plan:  Continue IV heparin  at 1000 units/hr Daily heparin  level & CBC while on heparin  Continue to monitor for s/sx of bleeding  F/U long-term anticoagulation plans pending surgery clearance   Thank you for allowing pharmacy to be a part of this patient's care.  Bard Jeans, PharmD, BCPS [phone] 10/11/2023, 7:39 AM

## 2023-10-11 NOTE — Progress Notes (Signed)
 PROGRESS NOTE    Tracey [name]  FMW:[id number] DOB: [birth year] DOA: 10/01/2023 PCP: Mast, Man X, NP    Chief Complaint  Patient presents with   Constipation    Brief Narrative:  Tracey [name] is an 83 yo female with PMH cognitive impairment, bipolar, possible Parkinson's, history of GIST who presented with abdominal pain and distension.  Initial CT A/P showed high-grade small bowel obstruction with transition point in the pelvis to the right of the midline and mild mesenteric edema.  She was treated supportively at first and NG tube was placed for decompression. Repeat CT was performed on 10/05/2023 showing ongoing dilated small bowel loops with no discernible contrast in the colon. She was eventually taken to the OR on 10/07/2023 for diagnostic laparoscopy with LOA and repair of prior small bowel enterotomy.   Assessment & Plan:   Principal Problem:   SBO (small bowel obstruction) (HCC) Active Problems:   Difficulty hearing   Essential (primary) hypertension   GERD (gastroesophageal reflux disease)   Major neurocognitive disorder (HCC)   Protein-calorie malnutrition, severe   New onset a-fib (HCC)   Hypotension   Dementia without behavioral disturbance (HCC)   Hypophosphatemia   Left ear pain   Acute postoperative anemia due to expected blood loss   Hypernatremia   Iron  deficiency anemia  #1 SBO -CT abdomen and pelvis done on admission consistent with high-grade small bowel obstruction with transition point in the pelvis to the right of the midline and mild mesenteric edema. - Status post NG tube placement for decompression. - Repeat CT scan done on 10/05/2023 with ongoing dilated small bowel loops with no discernible contrast in the colon. - Patient seen and consulted on by general surgery who followed the patient throughout the hospitalization. - Due to no improvement with conservative treatments patient was subsequently taken to the OR on 10/07/2023 for diagnostic laparoscopy  with LOA and repair of prior small bowel enterotomy. - Currently NG tube in place until bowel function returns per general surgery. - Patient started on TNA (10/08/2023 ) per general surgery. -Pulmonary toileting. - Per general surgery.  2.  Leukocytosis -With a leukocytosis noted on admission with WBC trending up and subsequently trending back down currently at 10K from 22.4 K from 26.5 from 7.3. -Chest x-ray obtained 10/08/2023 negative for any acute infiltrate. -Urinalysis done nitrite negative, leukocytes negative -Patient afebrile. - Blood cultures pending with no growth to date.  -If worsening leukocytosis may need CT abdomen and pelvis to rule out abscess formation. -Per general surgery.  3.  Hypotension -Patient noted to be hypotensive during surgery. - Patient noted with a low MAP postoperatively which was downtrending and patient received a LR bolus - Status post IV albumin  x 2 days.   - Blood pressure has improved.   - Patient with some complaints of shortness of breath, crackles noted on examination on 10/09/2023 received Lasix  40 mg IV x 1 with urine output of 3.925 L. - Hypotension resolved. - Gentle hydration. - Patient on TPN.  4.  Postop acute blood loss anemia/severe iron  deficiency anemia - Patient with no overt GI bleed. -Status post transfusion 1 unit PRBCs. - Hemoglobin currently at 10.8 from 10.2 from 8.0 from 13.2 on admission -Anemia panel with iron <10, TIBC of 106, folate of > 20. - Status post IV iron . -Follow H&H. -  5.  A-fib with RVR -Currently in normal sinus rhythm with some bouts of bradycardia early on which has since resolved. - Patient was seen by  cardiology prior to surgery. - Patient was on IV amiodarone  which has subsequently been discontinued. - Patient also placed on heparin  drip which was held and cleared by general surgery which was started on 10/08/2023.  - Per cardiology, once tolerating oral intake could start on amiodarone  100 mg daily  and when okay with general surgery could potentially switch to Eliquis p.o. - Cardiology was following but signed off on 10/07/2023. - Outpatient follow-up with cardiology.  6.  Severe protein calorie malnutrition -BMI of 20.24 kg/m. - Percent weight loss 11% in 6 months per RD. - Patient currently n.p.o. secondary to problem #1 while awaiting bowel function to return. - Patient currently on TPN per general surgery recommendations.  7.  Dementia -Stable. - Delirium precautions.  8.  Volume depletion/dehydration/hypernatremia -Patient noted to be hypotensive postoperatively currently on IV fluids and IV albumin . -BP improved. -Patient with worsening hypernatremia with sodium at 154 on 10/09/2023 which improved and resolved with D5W.   - Continue D5W for 12 hours and subsequently discontinued.   9. hypertension -Noted to be hypotensive, BP improved.. - Follow.  10.  Hypophosphatemia/hypokalemia - Phosphorus at 2.3.   - Potassium at 2.7.   - Electrolyte replacement per pharmacy as patient on TPN.   11.  Left ear pain -Concern for otitis externa. - Ciprodex  otic 4 drops to the left ear twice daily x 5 days.    DVT prophylaxis: Heparin  Code Status: DNR Family Communication: Updated patient.  No family at bedside. Disposition: Remain in stepdown unit  Status is: Inpatient Remains inpatient appropriate because: Severity of illness   Consultants:  General Surgery: Dr. Debby 10/01/2023 Cardiology: Dr. Levern 10/05/2023  Procedures:  PICC line placement 10/07/2023 PICC line exchange 10/08/2023 CT abdomen pelvis 10/01/2023 2D echo 10/04/2023 CT chest abdomen and pelvis 10/05/2023 Small bowel protocol 10/02/2023 Diagnostic laparoscopy, ex lap with LOA, primary repair of small bowel enterotomy per general surgery: Dr. Dasie 10/07/2023   Antimicrobials:  Anti-infectives (From admission, onward)    Start     Dose/Rate Route Frequency Ordered Stop   10/07/23 0915  cefoTEtan   (CEFOTAN ) 2 g in sodium chloride  0.9 % 100 mL IVPB        2 g 200 mL/hr over 30 Minutes Intravenous On call to O.R. 10/07/23 0820 10/07/23 1938         Subjective: Patient lying in bed.  NG tube in place with bilious drainage.  Patient states some shortness of breath.  Patient with complaints of abdominal pain and a headache.  Patient not sure whether she is passing flatus.  No bowel movement.    Objective: Vitals:   10/11/23 0749 10/11/23 0800 10/11/23 0900 10/11/23 1000  BP:  130/70 113/74 139/77  Pulse: 81 84 86 85  Resp: (!) 21 (!) 21 (!) 21 (!) 25  Temp:      TempSrc:      SpO2: 93% 94% 93% 94%  Weight:      Height:        Intake/Output Summary (Last 24 hours) at 10/11/2023 1028 Last data filed at 10/11/2023 1017 Gross per 24 hour  Intake 2737.14 ml  Output 4025 ml  Net -1287.86 ml   Filed Weights   10/08/23 0500 10/09/23 0404 10/10/23 0500  Weight: 57.5 kg 54.1 kg 54 kg    Examination:  General exam: NAD.  NG tube in place with bilious drainage.  Respiratory system: Lungs clear to auscultation bilaterally.  No rhonchi, no wheezing.  Fair air movement.  Speaking in  full sentences.   Cardiovascular system: Regular rate rhythm no murmurs rubs or gallops.  No JVD.  No pitting lower extremity edema. Gastrointestinal system: Abdomen is soft, nondistended, some diffuse tenderness to palpation.  Positive bowel sounds.  Honeycomb dressing in place.  Central nervous system: Alert and oriented.  Moving extremities spontaneously.  No focal neurological deficits. Extremities: Symmetric 5 x 5 power. Skin: No rashes, lesions or ulcers Psychiatry: Judgement and insight appear normal. Mood & affect appropriate.     Data Reviewed: I have personally reviewed following labs and imaging studies  CBC: Recent Labs  Lab 10/05/23 0309 10/06/23 0300 10/07/23 0312 10/08/23 0317 10/09/23 0357 10/09/23 1510 10/10/23 0500 10/11/23 0401  WBC 5.4   < > 7.3 22.2* 26.5*  --  22.4*  10.0  NEUTROABS 3.0  --   --   --   --   --  20.0* 7.7  HGB 11.9*   < > 11.2* 9.4* 8.0* 10.5* 10.2* 10.8*  HCT 39.4   < > 35.9* 29.3* 25.5* 33.2* 31.7* 35.1*  MCV 103.4*   < > 104.1* 103.5* 104.5*  --  99.7 100.3*  PLT 314   < > 327 232 197  --  221 202   < > = values in this interval not displayed.    Basic Metabolic Panel: Recent Labs  Lab 10/07/23 0312 10/07/23 0313 10/08/23 0317 10/09/23 0357 10/10/23 0500 10/11/23 0401  NA 145 144 146* 154* 149* 145  K 3.9 3.8 4.2 3.3* 3.1* 3.7  CL 112* 112* 114* 118* 110 106  CO2 23 23 22 24 27 28   GLUCOSE 130* 135* 105* 146* 141* 148*  BUN 17 17 16 19 22  27*  CREATININE 0.94 0.94 1.11* 1.01* 0.98 0.86  CALCIUM 9.3 9.5 9.4 10.2 9.9 9.9  MG 2.2  --  1.9 2.2 2.1 2.2  PHOS 2.4* 2.3* 4.0 2.4* 2.8 2.3*    GFR: Estimated Creatinine Clearance: 39.9 mL/min (by C-G formula based on SCr of 0.86 mg/dL).  Liver Function Tests: Recent Labs  Lab 10/07/23 0313 10/08/23 0317 10/09/23 0357 10/10/23 0500 10/11/23 0401  AST  --  17 17 17 15   ALT  --  8 6 10 8   ALKPHOS  --  36* 100 81 114  BILITOT  --  0.4 0.4 0.4 0.3  PROT  --  4.8* 5.3* 5.6* 5.7*  ALBUMIN  2.9* 3.5 3.8 3.6 3.4*    CBG: Recent Labs  Lab 10/10/23 1559 10/10/23 1648 10/10/23 1922 10/11/23 0024 10/11/23 0413  GLUCAP 125* 147* 168* 173* 147*     Recent Results (from the past 240 hours)  Culture, blood (Routine X 2) w Reflex to ID Panel     Status: None   Collection Time: 10/04/23  1:10 PM   Specimen: BLOOD LEFT ARM  Result Value Ref Range Status   Specimen Description   Final    BLOOD LEFT ARM Performed at [facility], [address], [place], [place] [address]    Special Requests   Final    BOTTLES DRAWN AEROBIC ONLY Blood Culture results may not be optimal due to an inadequate volume of blood received in culture bottles Performed at [facility], [address] [place], [place] [address]    Culture   Final    NO GROWTH 5  DAYS Performed at [facility], [address], [place], [place] [address]    Report Status 10/09/2023 FINAL  Final  Culture, blood (Routine X  2) w Reflex to ID Panel     Status: None   Collection Time: 10/04/23  1:10 PM   Specimen: BLOOD LEFT HAND  Result Value Ref Range Status   Specimen Description   Final    BLOOD LEFT HAND Performed at [facility], [address], [place], [place] [address]    Special Requests   Final    BOTTLES DRAWN AEROBIC AND ANAEROBIC Blood Culture results may not be optimal due to an inadequate volume of blood received in culture bottles Performed at [facility], [address], [place], [place] [address]    Culture   Final    NO GROWTH 5 DAYS Performed at [facility], [address] [place], [place] [address]    Report Status 10/09/2023 FINAL  Final  MRSA Next Gen by PCR, Nasal     Status: None   Collection Time: 10/04/23  2:48 PM   Specimen: Nasal Mucosa; Nasal Swab  Result Value Ref Range Status   MRSA by PCR Next Gen NOT DETECTED NOT DETECTED Final    Comment: (NOTE) The GeneXpert MRSA Assay (FDA approved for NASAL specimens only), is one component of a comprehensive MRSA colonization surveillance program. It is not intended to diagnose MRSA infection nor to guide or monitor treatment for MRSA infections. Test performance is not FDA approved in patients less than 51 years old. Performed at [facility], [address], [place], [place] [address]   Urine Culture (for pregnant, neutropenic or urologic patients or patients with an indwelling urinary catheter)     Status: None   Collection Time: 10/08/23  3:00 PM   Specimen: Urine, Clean Catch  Result Value Ref Range Status   Specimen Description   Final    URINE, CLEAN CATCH Performed at [facility], [address], [place], [place] [address]    Special Requests   Final    NONE Performed at [facility], [address] [place], [place] [address]    Culture   Final    NO GROWTH Performed at [facility], [address], [place], [place] [address]    Report Status 10/09/2023 FINAL  Final  Culture, blood (Routine X 2) w Reflex to ID Panel     Status: None (Preliminary result)   Collection Time: 10/09/23  8:52 AM   Specimen: BLOOD LEFT ARM  Result Value Ref Range Status   Specimen Description   Final    BLOOD LEFT ARM Performed at [facility], [address], [place], [place] [address]    Special Requests   Final    BOTTLES DRAWN AEROBIC ONLY Blood Culture results may not be optimal due to an inadequate volume of blood received in culture bottles Performed at [facility], [address] [place], [place] [address]    Culture   Final    NO GROWTH 2 DAYS Performed at [facility], [address], [place], [place] [address]    Report Status PENDING  Incomplete  Culture, blood (Routine X 2) w Reflex to ID Panel     Status: None (Preliminary result)   Collection Time: 10/09/23  9:09 AM   Specimen: BLOOD LEFT ARM  Result Value Ref Range Status   Specimen Description   Final    BLOOD LEFT ARM Performed at [facility], [address], [place], [place] [address]    Special Requests  Final    BOTTLES DRAWN AEROBIC ONLY Blood Culture results may not be optimal due to an inadequate volume of blood received in culture bottles Performed at [facility], [address] [place], [place] [address]    Culture   Final    NO GROWTH 2 DAYS Performed at [facility], [address] [place], [place] [address]    Report Status PENDING  Incomplete         Radiology Studies: No results found.       Scheduled Meds:  acetaminophen   650 mg Oral Once   Chlorhexidine  Gluconate Cloth  6 each Topical Daily   cycloSPORINE   1 drop Both Eyes BID   insulin  aspart  0-15 Units Subcutaneous Q6H   latanoprost   1 drop  Both Eyes QHS   lidocaine   1 patch Transdermal Q24H   mouth rinse  15 mL Mouth Rinse 4 times per day   sodium chloride  flush  10-40 mL Intracatheter Q12H   thiamine  (VITAMIN B1) injection  100 mg Intravenous Q24H   timolol   1 drop Both Eyes Daily   Continuous Infusions:  dextrose  45 mL/hr at 10/11/23 0653   heparin  1,000 Units/hr (10/11/23 0653)   potassium PHOSPHATE  IVPB (in mmol)     TPN ADULT (ION) 70 mL/hr at 10/11/23 0653   TPN ADULT (ION)       LOS: 10 days    Time spent: 40 minutes    Toribio Hummer, MD [facility]   To contact the attending provider between 7A-7P or the covering provider during after hours 7P-7A, please log into the web site [email or web address] and access using universal [place] password for that web site. If you do not have the password, please call the hospital operator.  10/11/2023, 10:28 AM

## 2023-10-11 NOTE — Progress Notes (Addendum)
 PHARMACY - TOTAL PARENTERAL NUTRITION CONSULT NOTE   Indication: Small bowel obstruction  Patient Measurements: Height: 5' 2 (157.5 cm) Weight: 54 kg (119 lb 0.8 oz) IBW/kg (Calculated) : 50.1 TPN AdjBW (KG): 50.2 Body mass index is 21.77 kg/m.  Assessment:  83 YO female presenting 9/17 with abdominal pain and distension. Initial CT A/P showed high-grade small bowel obstruction with transition point in the pelvis to the right of the midline and mild mesenteric edema, repeat CT continued to show ongoing dilated small bowel loops. Patient taken to OR 9/23 for diagnostic laparoscopy with LOA and repair of prior small bowel enterotomy. NGT in place. Pharmacy consulted for TPN management in the setting of SBO.   Glucose / Insulin : No Hx DM - CBGs mostly well controlled (goal 100-150) on goal rate TPN and D5W at 45 ml/hr - 14 units SSI given yesterday Electrolytes:  - Na decreased to WNL after removing from TPN/mIVF - K normalized with repletion yesterday - Ca remains borderline elevated (after correction) - Phos now slightly low - Cl, bicarb, Mg stable WNL Renal: BUN rising; SCr stable WNL; UOP robust with Lasix  given the past two days Hepatic: albumin  slightly low, otherwise LFTs/Tbili WNL - TG pending I/O: NG output continues to increase; ~1700 ml yesterday - mIVF: D5W at 45 ml/hr per MD - LBM 9/15; per Surgery, still awaiting ROBF GI Imaging: - 9/17 a/p: High-grade small bowel obstruction with transition point in the pelvis to the right of midline - 9/19 DG abd: SBO pattern with no improvement since the prior CT - 9/21 CT chest/a/p: Small bowel loops remain dilated in the abdomen and pelvis. There is some diluted contrast in the dilated small bowel loops, but no discernible contrast in the colonic lumen. The degree of small-bowel dilatation appears stable to mildly progressive in the interval - 9/23 AXR: persistent SBO with mild improvement - 9/24 AXR: some decompression of SB in  upper abdomen noted GI Surgeries / Procedures:  - 9/23: Exploratory laparotomy with lysis of adhesions, primary repair of small bowel enterotomy  Central access: PICC obtained 9/23 TPN start date: 9/24  Nutritional Goals: Goal TPN rate is 65 mL/hr (provides 89 g of protein and 1619 kcals per day)  RD Assessment: Estimated Needs Total Energy Estimated Needs: 1500-1700 kcals Total Protein Estimated Needs: 75-90 grams Total Fluid Estimated Needs: >/= 1.5L  Current Nutrition:  NPO and TPN  Plan:  Now: KPhos 15 mmol IV x 1  At 1800: Continue TPN at goal rate of 65 mL/hr Electrolytes in TPN: increase Phos, Cl:Ac ratio Na 0 mEq/L K 50 mEq/L Ca 0 mEq/L Mg 3 mEq/L Phos 15 >> 20 mmol/L Cl:Ac max acetate >> 1:2 Add standard MVI and trace elements to TPN Chromium remains on hold d/t nationwide shortage Liberalize moderate-scale SSI to q6 hr Continue D5W at 45 ml/hr for now, but will not renew orders at 1800, given hyperNa resolved and requiring diuresis Thiamine  100 mg IV daily x5 days (thru 9/29) as recommended by RD Monitor TPN labs on Mon/Thurs BMP, Phos tomorrow   Bard Jeans, PharmD, BCPS [phone] 10/11/2023, 8:49 AM

## 2023-10-12 DIAGNOSIS — I959 Hypotension, unspecified: Secondary | ICD-10-CM | POA: Diagnosis not present

## 2023-10-12 DIAGNOSIS — I4891 Unspecified atrial fibrillation: Secondary | ICD-10-CM | POA: Diagnosis not present

## 2023-10-12 DIAGNOSIS — I10 Essential (primary) hypertension: Secondary | ICD-10-CM | POA: Diagnosis not present

## 2023-10-12 DIAGNOSIS — K56609 Unspecified intestinal obstruction, unspecified as to partial versus complete obstruction: Secondary | ICD-10-CM | POA: Diagnosis not present

## 2023-10-12 LAB — BASIC METABOLIC PANEL WITH GFR
Anion gap: 13 (ref 5–15)
BUN: 35 mg/dL — ABNORMAL HIGH (ref 8–23)
CO2: 23 mmol/L (ref 22–32)
Calcium: 10 mg/dL (ref 8.9–10.3)
Chloride: 105 mmol/L (ref 98–111)
Creatinine, Ser: 0.82 mg/dL (ref 0.44–1.00)
GFR, Estimated: >60 mL/min (ref >60)
Glucose, Bld: 133 mg/dL — ABNORMAL HIGH (ref 70–99)
Potassium: 4.8 mmol/L (ref 3.5–5.1)
Sodium: 141 mmol/L (ref 135–145)

## 2023-10-12 LAB — CBC WITH DIFFERENTIAL/PLATELET
Abs Immature Granulocytes: 0.28 K/uL — ABNORMAL HIGH (ref 0.00–0.07)
Basophils Absolute: 0.1 K/uL (ref 0.0–0.1)
Basophils Relative: 1 %
Eosinophils Absolute: 0.1 K/uL (ref 0.0–0.5)
Eosinophils Relative: 1 %
HCT: 35.2 % — ABNORMAL LOW (ref 36.0–46.0)
Hemoglobin: 11.1 g/dL — ABNORMAL LOW (ref 12.0–15.0)
Immature Granulocytes: 2 %
Lymphocytes Relative: 16 %
Lymphs Abs: 1.9 K/uL (ref 0.7–4.0)
MCH: 31.8 pg (ref 26.0–34.0)
MCHC: 31.5 g/dL (ref 30.0–36.0)
MCV: 100.9 fL — ABNORMAL HIGH (ref 80.0–100.0)
Monocytes Absolute: 0.6 K/uL (ref 0.1–1.0)
Monocytes Relative: 5 %
Neutro Abs: 8.9 K/uL — ABNORMAL HIGH (ref 1.7–7.7)
Neutrophils Relative %: 75 %
Platelets: 223 K/uL (ref 150–400)
RBC: 3.49 MIL/uL — ABNORMAL LOW (ref 3.87–5.11)
RDW: 15.3 % (ref 11.5–15.5)
WBC: 11.8 K/uL — ABNORMAL HIGH (ref 4.0–10.5)
nRBC: 0 % (ref 0.0–0.2)

## 2023-10-12 LAB — HEPARIN LEVEL (UNFRACTIONATED): Heparin Unfractionated: 0.38 [IU]/mL (ref 0.30–0.70)

## 2023-10-12 LAB — PHOSPHORUS: Phosphorus: 3.6 mg/dL (ref 2.5–4.6)

## 2023-10-12 LAB — GLUCOSE, CAPILLARY
Glucose-Capillary: 138 mg/dL — ABNORMAL HIGH (ref 70–99)
Glucose-Capillary: 132 mg/dL — ABNORMAL HIGH (ref 70–99)
Glucose-Capillary: 130 mg/dL — ABNORMAL HIGH (ref 70–99)
Glucose-Capillary: 132 mg/dL — ABNORMAL HIGH (ref 70–99)

## 2023-10-12 MED ORDER — PANTOPRAZOLE SODIUM 40 MG IV SOLR
40.0000 mg | INTRAVENOUS | Status: DC
  Administered 2023-10-12: 40 mg via INTRAVENOUS
  Filled 2023-10-12: qty 10

## 2023-10-12 MED ORDER — TRAVASOL 10 % IV SOLN
INTRAVENOUS | Status: AC
  Filled 2023-10-12: qty 889.2

## 2023-10-12 NOTE — Plan of Care (Incomplete)
  Problem: Education: Goal: Knowledge of General Education information will improve Description: Including pain rating scale, medication(s)/side effects and non-pharmacologic comfort measures Outcome: Progressing   Problem: Health Behavior/Discharge Planning: Goal: Ability to manage health-related needs will improve Outcome: Progressing   Problem: Clinical Measurements: Goal: Ability to maintain clinical measurements within normal limits will improve Outcome: Progressing Goal: Will remain free from infection Outcome: Progressing Goal: Diagnostic test results will improve Outcome: Progressing Goal: Respiratory complications will improve Outcome: Progressing Goal: Cardiovascular complication will be avoided Outcome: Progressing   Problem: Activity: Goal: Risk for activity intolerance will decrease Outcome: Progressing   Problem: Nutrition: Goal: Adequate nutrition will be maintained Outcome: Progressing   Problem: Coping: Goal: Level of anxiety will decrease Outcome: Progressing   Problem: Elimination: Goal: Will not experience complications related to urinary retention Outcome: Progressing   Problem: Pain Managment: Goal: General experience of comfort will improve and/or be controlled Outcome: Progressing   Problem: Safety: Goal: Ability to remain free from injury will improve Outcome: Progressing   Problem: Skin Integrity: Goal: Risk for impaired skin integrity will decrease Outcome: Progressing

## 2023-10-12 NOTE — Progress Notes (Signed)
 PHARMACY - TOTAL PARENTERAL NUTRITION CONSULT NOTE   Indication: Small bowel obstruction  Patient Measurements: Height: 5' 2 (157.5 cm) Weight: 48.8 kg (107 lb 9.4 oz) IBW/kg (Calculated) : 50.1 TPN AdjBW (KG): 50.2 Body mass index is 19.68 kg/m.  Assessment:  83 YO female presenting 9/17 with abdominal pain and distension. Initial CT A/P showed high-grade small bowel obstruction with transition point in the pelvis to the right of the midline and mild mesenteric edema, repeat CT continued to show ongoing dilated small bowel loops. Patient taken to OR 9/23 for diagnostic laparoscopy with LOA and repair of prior small bowel enterotomy. NGT in place. Pharmacy consulted for TPN management in the setting of SBO.   Glucose / Insulin : No Hx DM - CBGs all well controlled (goal 100-150) on goal rate TPN - 10 units SSI given yesterday Electrolytes:  - Na decreased to WNL after removing from TPN/mIVF; continues to decrease today - K WNL but rising rapidly (>1.0 mmol/L after only getting ~22 mEq yesterday) - Ca remains borderline elevated (after correction) - Phos normalized after repletion yesterday - Cl, bicarb, Mg stable WNL Renal: BUN rising; SCr stable WNL; UOP remains robust after Lasix  given 9/25-9/26 Hepatic: albumin  slightly low, otherwise LFTs/Tbili WNL - TG pending I/O: NG output slightly improved yesterday; clamping trials today, but not tolerating very well so far - mIVF: none currently - LBM 9/15, although some early signs of ROBF GI Imaging: - 9/17 a/p: High-grade small bowel obstruction with transition point in the pelvis to the right of midline - 9/19 DG abd: SBO pattern with no improvement since the prior CT - 9/21 CT chest/a/p: Small bowel loops remain dilated in the abdomen and pelvis. There is some diluted contrast in the dilated small bowel loops, but no discernible contrast in the colonic lumen. The degree of small-bowel dilatation appears stable to mildly progressive in  the interval - 9/23 AXR: persistent SBO with mild improvement - 9/24 AXR: some decompression of SB in upper abdomen noted GI Surgeries / Procedures:  - 9/23: Exploratory laparotomy with lysis of adhesions, primary repair of small bowel enterotomy  Central access: PICC obtained 9/23 TPN start date: 9/24  Nutritional Goals: Goal TPN rate is 65 mL/hr (provides 89 g of protein and 1619 kcals per day)  RD Assessment: Estimated Needs Total Energy Estimated Needs: 1500-1700 kcals Total Protein Estimated Needs: 75-90 grams Total Fluid Estimated Needs: >/= 1.5L  Current Nutrition:  NPO and TPN  Plan:   At 1800: Continue TPN at goal rate of 65 mL/hr Electrolytes in TPN: add back low Na (to prevent overcorrection), decrease K slightly, increase Ac Na 0 >> 20 mEq/L (equivalent to ~1/8 NS) K 50 >> 40 mEq/L Ca 0 mEq/L Mg 3 mEq/L Phos 20 mmol/L Cl:Ac - 1:2 >> max Ac Add standard MVI and trace elements to TPN Chromium remains on hold d/t nationwide shortage Liberalize moderate-scale SSI to q8 hr given excellent control If no further changes in TPN rate/kcal, consider stopping CBGs altogether Further mIVF per MD (none currently) Thiamine  100 mg IV daily x5 days (thru 9/29) as recommended by RD Monitor TPN labs on Mon/Thurs   Bard Jeans, PharmD, BCPS [phone] 10/12/2023, 11:36 AM

## 2023-10-12 NOTE — Progress Notes (Signed)
 5 Days Post-Op   Subjective/Chief Complaint: Feels much better, has passed some flatus, did not get oob yesterday   Objective: Vital signs in last 24 hours: Temp:  [98.1 F (36.7 C)-98.6 F (37 C)] 98.1 F (36.7 C) (09/28 0434) Pulse Rate:  [59-91] 83 (09/28 0604) Resp:  [16-27] 20 (09/28 0604) BP: (110-162)/(54-80) 112/54 (09/28 0604) SpO2:  [93 %-96 %] 93 % (09/28 0604) Weight:  [48.8 kg] 48.8 kg (09/28 0500) Last BM Date : 09/29/23  Intake/Output from previous day: 09/27 0701 - 09/28 0700 In: 2520.2 [P.O.:30; I.V.:2406.9; IV Piggyback:83.4] Out: 2750 [Urine:1900; Emesis/NG output:850] Intake/Output this shift: No intake/output data recorded.   General nad CV regular Ab approp tender dressing clean, soft nondistended  BMET Recent Labs    10/11/23 0401 10/12/23 0538  NA 145 141  K 3.7 4.8  CL 106 105  CO2 28 23  GLUCOSE 148* 133*  BUN 27* 35*  CREATININE 0.86 0.82  CALCIUM 9.9 10.0   PT/INR No results for input(s): LABPROT, INR in the last 72 hours. ABG No results for input(s): PHART, HCO3 in the last 72 hours.  Invalid input(s): PCO2, PO2  Studies/Results: No results found.  Anti-infectives: Anti-infectives (From admission, onward)    Start     Dose/Rate Route Frequency Ordered Stop   10/07/23 0915  cefoTEtan  (CEFOTAN ) 2 g in sodium chloride  0.9 % 100 mL IVPB        2 g 200 mL/hr over 30 Minutes Intravenous On call to O.R. 10/07/23 0820 10/07/23 1938       Assessment/Plan: POD 5 s/p dx lap converted to ex lap with LOA and repair of enterotomy, Dr. Dasie 9/23  pSBO -ab soft and flatus- will try to clamp and remove ng later today -PT/OT to mobilize-needs to be oob today again -WBC a little elevated today will follow -pulm toilet, IS -multi-modal pain control   FEN - NGT/NPO/TPN VTE - heparin  gtt      Per TR[facility] --  Dementia  GERD Tremors  HTN  Anxiety  A fib RVR - on amio gtt, heparin  gtt  Anemia   Tracey [name] 10/12/2023

## 2023-10-12 NOTE — Progress Notes (Signed)
 PHARMACY - ANTICOAGULATION CONSULT NOTE  Pharmacy Consult for heparin   Indication: atrial fibrillation  Allergies  Allergen Reactions   Lisinopril Cough   Penicillins Itching and Other (See Comments)    50 years ago   Adhesive [Tape] Itching   Atorvastatin Itching   Dilaudid  [Hydromorphone  Hcl] Itching    Patient Measurements: Height: 5' 2 (157.5 cm) Weight: 48.8 kg (107 lb 9.4 oz) IBW/kg (Calculated) : 50.1 HEPARIN  DW (KG): 50.2  Vital Signs: Temp: 98.1 F (36.7 C) (09/28 0434) Temp Source: Oral (09/28 0434) BP: 109/59 (09/28 1000) Pulse Rate: 74 (09/28 1000)  Labs: Recent Labs    10/10/23 0500 10/10/23 0930 10/10/23 1706 10/11/23 0401 10/12/23 0538  HGB 10.2*  --   --  10.8* 11.1*  HCT 31.7*  --   --  35.1* 35.2*  PLT 221  --   --  202 223  HEPARINUNFRC  --    < > 0.40 0.46 0.38  CREATININE 0.98  --   --  0.86 0.82   < > = values in this interval not displayed.    Estimated Creatinine Clearance: 40.7 mL/min (by C-G formula based on SCr of 0.82 mg/dL).   Medications:  No prior to admission anticoagulation meds listed  Assessment: 83 yo F admitted with a high grade pSBO.  Pharmacy consulted to dose Heparin  for new onset Afib with RVR. No prior to admission anticoagulation.    Today, 10/12/23 Daily heparin  level remains therapeutic and stable on 1000 units/hr Hgb slightly low but stable; Plt stable WNL SCr stable <1.0 No bleeding or infusion issues per RN  Goal of Therapy:  Heparin  level 0.3-0.7 units/ml Monitor platelets by anticoagulation protocol: Yes   Plan:  Continue IV heparin  at 1000 units/hr Daily heparin  level & CBC while on heparin  Continue to monitor for s/sx of bleeding  F/U long-term anticoagulation plans pending surgery clearance   Thank you for allowing pharmacy to be a part of this patient's care.  Bard Jeans, PharmD, BCPS [phone] 10/12/2023, 11:22 AM

## 2023-10-12 NOTE — Plan of Care (Signed)
  Problem: Activity: Goal: Risk for activity intolerance will decrease Outcome: Not Progressing   Problem: Nutrition: Goal: Adequate nutrition will be maintained Outcome: Not Progressing   Problem: Coping: Goal: Level of anxiety will decrease Outcome: Not Progressing   Problem: Elimination: Goal: Will not experience complications related to bowel motility Outcome: Not Progressing   Problem: Pain Managment: Goal: General experience of comfort will improve and/or be controlled Outcome: Not Progressing

## 2023-10-12 NOTE — Progress Notes (Signed)
 PROGRESS NOTE    Tracey [name]  FMW:[id number] DOB: 03/19/1940 DOA: 10/01/2023 PCP: Mast, Man X, NP    Chief Complaint  Patient presents with   Constipation    Brief Narrative:  [name] is an 83 yo female with PMH cognitive impairment, bipolar, possible Parkinson's, history of GIST who presented with abdominal pain and distension.  Initial CT A/P showed high-grade small bowel obstruction with transition point in the pelvis to the right of the midline and mild mesenteric edema.  She was treated supportively at first and NG tube was placed for decompression. Repeat CT was performed on 10/05/2023 showing ongoing dilated small bowel loops with no discernible contrast in the colon. She was eventually taken to the OR on 10/07/2023 for diagnostic laparoscopy with LOA and repair of prior small bowel enterotomy.   Assessment & Plan:   Principal Problem:   SBO (small bowel obstruction) (HCC) Active Problems:   Difficulty hearing   Essential (primary) hypertension   GERD (gastroesophageal reflux disease)   Major neurocognitive disorder (HCC)   Protein-calorie malnutrition, severe   New onset a-fib (HCC)   Hypotension   Dementia without behavioral disturbance (HCC)   Hypophosphatemia   Left ear pain   Acute postoperative anemia due to expected blood loss   Hypernatremia   Iron  deficiency anemia  #1 SBO -CT abdomen and pelvis done on admission consistent with high-grade small bowel obstruction with transition point in the pelvis to the right of the midline and mild mesenteric edema. - Status post NG tube placement for decompression. - Repeat CT scan done on 10/05/2023 with ongoing dilated small bowel loops with no discernible contrast in the colon. - Patient seen and consulted on by general surgery who followed the patient throughout the hospitalization. - Due to no improvement with conservative treatments patient was subsequently taken to the OR on 10/07/2023 for diagnostic laparoscopy  with LOA and repair of prior small bowel enterotomy. - Currently NG tube in place until bowel function returns per general surgery. -NG tube clamped today per general surgery. - Patient started on TNA (10/08/2023 ) per general surgery. -Pulmonary toileting. -Mobilize. - Per general surgery.  2.  Leukocytosis -With a leukocytosis noted on admission with WBC trending up and subsequently trending back down currently at 11.8 K from 10K from 22.4 K from 26.5 from 7.3. -Chest x-ray obtained 10/08/2023 negative for any acute infiltrate. -Urinalysis done nitrite negative, leukocytes negative -Patient afebrile. - Blood cultures pending with no growth to date x 3 days.  -If worsening leukocytosis may need CT abdomen and pelvis to rule out abscess formation. -Per general surgery.  3.  Hypotension -Patient noted to be hypotensive during surgery. - Patient noted with a low MAP postoperatively which was downtrending and patient received a LR bolus - Status post IV albumin  x 2 days.   - Blood pressure has improved.   - Patient with some complaints of shortness of breath, crackles noted on examination on 10/09/2023 received Lasix  40 mg IV x 1 with urine output of 3.925 L. - Hypotension resolved. - IV fluids discontinued. - Patient on TPN.  4.  Postop acute blood loss anemia/severe iron  deficiency anemia - Patient with no overt GI bleed. -Status post transfusion 1 unit PRBCs. - Hemoglobin currently at 11.1 from 10.8 from 10.2 from 8.0 from 13.2 on admission -Anemia panel with iron <10, TIBC of 106, folate of > 20. - Status post IV iron . -Follow H&H. -  5.  A-fib with RVR -Currently in normal sinus  rhythm with some bouts of bradycardia early on which has since resolved. - Patient was seen by cardiology prior to surgery. - Patient was on IV amiodarone  which has subsequently been discontinued. - Patient also placed on heparin  drip which was held and cleared by general surgery which was started on  10/08/2023.  - Per cardiology, once tolerating oral intake could start on amiodarone  100 mg daily and when okay with general surgery could potentially switch to Eliquis p.o. - Cardiology was following but signed off on 10/07/2023. - Outpatient follow-up with cardiology.  6.  Severe protein calorie malnutrition -BMI of 20.24 kg/m. - Percent weight loss 11% in 6 months per RD. - Patient currently n.p.o. secondary to problem #1 while awaiting bowel function to return. - Patient currently on TPN per general surgery recommendations.  7.  Dementia -Stable. - Delirium precautions.  8.  Volume depletion/dehydration/hypernatremia -Patient noted to be hypotensive postoperatively was on IV fluids and IV albumin . -BP improved. -Patient with worsening hypernatremia with sodium at 154 on 10/09/2023 which improved and resolved with D5W.   - D5W discontinued.   9. hypertension -Noted to be hypotensive, BP improved.. - Follow.  10.  Hypophosphatemia/hypokalemia - Phosphorus at 3.6. - Potassium at 4.8. - Electrolyte replacement per pharmacy as patient on TPN.   11.  Left ear pain -Concern for otitis externa. - Ciprodex  otic 4 drops to the left ear twice daily x 5 days.    DVT prophylaxis: Heparin  Code Status: DNR Family Communication: Updated patient.  No family at bedside. Disposition: Remain in stepdown unit  Status is: Inpatient Remains inpatient appropriate because: Severity of illness   Consultants:  General Surgery: Dr. Debby 10/01/2023 Cardiology: Dr. Levern 10/05/2023  Procedures:  PICC line placement 10/07/2023 PICC line exchange 10/08/2023 CT abdomen pelvis 10/01/2023 2D echo 10/04/2023 CT chest abdomen and pelvis 10/05/2023 Small bowel protocol 10/02/2023 Diagnostic laparoscopy, ex lap with LOA, primary repair of small bowel enterotomy per general surgery: Dr. Dasie 10/07/2023   Antimicrobials:  Anti-infectives (From admission, onward)    Start     Dose/Rate Route  Frequency Ordered Stop   10/07/23 0915  cefoTEtan  (CEFOTAN ) 2 g in sodium chloride  0.9 % 100 mL IVPB        2 g 200 mL/hr over 30 Minutes Intravenous On call to O.R. 10/07/23 0820 10/07/23 1938         Subjective: Patient laying in bed.  NG tube clamped.  Patient with complaints of diffuse abdominal pain.  Positive flatus.  No bowel movement.  Some complaints of shortness of breath.    Objective: Vitals:   10/12/23 0604 10/12/23 0700 10/12/23 0800 10/12/23 0900  BP: (!) 112/54  135/65   Pulse: 83 88 92 84  Resp: 20 (!) 23 (!) 25 18  Temp:      TempSrc:      SpO2: 93% 95% 93% 95%  Weight:      Height:        Intake/Output Summary (Last 24 hours) at 10/12/2023 1007 Last data filed at 10/12/2023 0944 Gross per 24 hour  Intake 2465.53 ml  Output 2950 ml  Net -484.47 ml   Filed Weights   10/09/23 0404 10/10/23 0500 10/12/23 0500  Weight: 54.1 kg 54 kg 48.8 kg    Examination:  General exam: NAD.  NG tube in place and clamped.  Bilious drainage noted in canister.   Respiratory system: CTAB.  No wheezes, no crackles, no rhonchi.  Fair air movement.  Speaking in full sentences.  Cardiovascular system: RRR no murmurs rubs or gallops.  No JVD.  No pitting lower extremity edema. Gastrointestinal system: Abdomen is soft, nondistended, positive bowel sounds, some diffuse tenderness to palpation.  Honeycomb dressing in place.  Central nervous system: Alert and oriented.  Moving extremities spontaneously.  No focal neurological deficits. Extremities: Symmetric 5 x 5 power. Skin: No rashes, lesions or ulcers Psychiatry: Judgement and insight appear normal. Mood & affect appropriate.     Data Reviewed: I have personally reviewed following labs and imaging studies  CBC: Recent Labs  Lab 10/08/23 0317 10/09/23 0357 10/09/23 1510 10/10/23 0500 10/11/23 0401 10/12/23 0538  WBC 22.2* 26.5*  --  22.4* 10.0 11.8*  NEUTROABS  --   --   --  20.0* 7.7 8.9*  HGB 9.4* 8.0* 10.5* 10.2*  10.8* 11.1*  HCT 29.3* 25.5* 33.2* 31.7* 35.1* 35.2*  MCV 103.5* 104.5*  --  99.7 100.3* 100.9*  PLT 232 197  --  221 202 223    Basic Metabolic Panel: Recent Labs  Lab 10/07/23 0312 10/07/23 0313 10/08/23 0317 10/09/23 0357 10/10/23 0500 10/11/23 0401 10/12/23 0538  NA 145   < > 146* 154* 149* 145 141  K 3.9   < > 4.2 3.3* 3.1* 3.7 4.8  CL 112*   < > 114* 118* 110 106 105  CO2 23   < > 22 24 27 28 23   GLUCOSE 130*   < > 105* 146* 141* 148* 133*  BUN 17   < > 16 19 22  27* 35*  CREATININE 0.94   < > 1.11* 1.01* 0.98 0.86 0.82  CALCIUM 9.3   < > 9.4 10.2 9.9 9.9 10.0  MG 2.2  --  1.9 2.2 2.1 2.2  --   PHOS 2.4*   < > 4.0 2.4* 2.8 2.3* 3.6   < > = values in this interval not displayed.    GFR: Estimated Creatinine Clearance: 40.7 mL/min (by C-G formula based on SCr of 0.82 mg/dL).  Liver Function Tests: Recent Labs  Lab 10/07/23 0313 10/08/23 0317 10/09/23 0357 10/10/23 0500 10/11/23 0401  AST  --  17 17 17 15   ALT  --  8 6 10 8   ALKPHOS  --  36* 100 81 114  BILITOT  --  0.4 0.4 0.4 0.3  PROT  --  4.8* 5.3* 5.6* 5.7*  ALBUMIN  2.9* 3.5 3.8 3.6 3.4*    CBG: Recent Labs  Lab 10/11/23 0810 10/11/23 1134 10/11/23 1832 10/11/23 2349 10/12/23 0517  GLUCAP 138* 132* 138* 138* 132*     Recent Results (from the past 240 hours)  Culture, blood (Routine X 2) w Reflex to ID Panel     Status: None   Collection Time: 10/04/23  1:10 PM   Specimen: BLOOD LEFT ARM  Result Value Ref Range Status   Specimen Description   Final    BLOOD LEFT ARM Performed at [facility], [address], [place], [place] [address]    Special Requests   Final    BOTTLES DRAWN AEROBIC ONLY Blood Culture results may not be optimal due to an inadequate volume of blood received in culture bottles Performed at [facility], [address], [place], [place] [address]    Culture   Final    NO GROWTH 5 DAYS Performed at [facility], [address]  [place], [place] [address]    Report Status 10/09/2023 FINAL  Final  Culture, blood (Routine X 2)  w Reflex to ID Panel     Status: None   Collection Time: 10/04/23  1:10 PM   Specimen: BLOOD LEFT HAND  Result Value Ref Range Status   Specimen Description   Final    BLOOD LEFT HAND Performed at [facility], [address], [place], [place] [address]    Special Requests   Final    BOTTLES DRAWN AEROBIC AND ANAEROBIC Blood Culture results may not be optimal due to an inadequate volume of blood received in culture bottles Performed at [facility], [address], [place], [place] [address]    Culture   Final    NO GROWTH 5 DAYS Performed at [facility], [address] [place], [place] [address]    Report Status 10/09/2023 FINAL  Final  MRSA Next Gen by PCR, Nasal     Status: None   Collection Time: 10/04/23  2:48 PM   Specimen: Nasal Mucosa; Nasal Swab  Result Value Ref Range Status   MRSA by PCR Next Gen NOT DETECTED NOT DETECTED Final    Comment: (NOTE) The GeneXpert MRSA Assay (FDA approved for NASAL specimens only), is one component of a comprehensive MRSA colonization surveillance program. It is not intended to diagnose MRSA infection nor to guide or monitor treatment for MRSA infections. Test performance is not FDA approved in patients less than 78 years old. Performed at [facility], [address] [place], [place] [address]   Urine Culture (for pregnant, neutropenic or urologic patients or patients with an indwelling urinary catheter)     Status: None   Collection Time: 10/08/23  3:00 PM   Specimen: Urine, Clean Catch  Result Value Ref Range Status   Specimen Description   Final    URINE, CLEAN CATCH Performed at [facility], [address] [place], [place] [address]    Special Requests   Final    NONE Performed at [facility], [address], [place], [place] [address]     Culture   Final    NO GROWTH Performed at [facility], [address], [place], [place] [address]    Report Status 10/09/2023 FINAL  Final  Culture, blood (Routine X 2) w Reflex to ID Panel     Status: None (Preliminary result)   Collection Time: 10/09/23  8:52 AM   Specimen: BLOOD LEFT ARM  Result Value Ref Range Status   Specimen Description   Final    BLOOD LEFT ARM Performed at [facility], [address] [place], [place] [address]    Special Requests   Final    BOTTLES DRAWN AEROBIC ONLY Blood Culture results may not be optimal due to an inadequate volume of blood received in culture bottles Performed at [facility], [address] [place] [address], [place], [place] [address]    Culture   Final    NO GROWTH 3 DAYS Performed at [facility], [address] [place], [place] [address]    Report Status PENDING  Incomplete  Culture, blood (Routine X 2) w Reflex to ID Panel     Status: None (Preliminary result)   Collection Time: 10/09/23  9:09 AM   Specimen: BLOOD LEFT ARM  Result Value Ref Range Status   Specimen Description   Final    BLOOD LEFT ARM Performed at [facility], [address], [place], [place] [address]    Special Requests  Final    BOTTLES DRAWN AEROBIC ONLY Blood Culture results may not be optimal due to an inadequate volume of blood received in culture bottles Performed at [facility], [address], [place], [place] [address]    Culture   Final    NO GROWTH 3 DAYS Performed at [facility], [address] [place], [place] [address]    Report Status PENDING  Incomplete         Radiology Studies: No results found.       Scheduled Meds:  acetaminophen   650 mg Oral Once   Chlorhexidine  Gluconate Cloth  6 each Topical Daily   cycloSPORINE   1 drop Both Eyes BID   insulin  aspart  0-15 Units Subcutaneous Q6H   latanoprost   1 drop Both Eyes QHS   lidocaine   1 patch Transdermal Q24H    sodium chloride  flush  10-40 mL Intracatheter Q12H   thiamine  (VITAMIN B1) injection  100 mg Intravenous Q24H   timolol   1 drop Both Eyes Daily   Continuous Infusions:  heparin  1,000 Units/hr (10/12/23 0816)   TPN ADULT (ION) 65 mL/hr at 10/12/23 0800     LOS: 11 days    Time spent: 40 minutes    Toribio Hummer, MD [facility]   To contact the attending provider between 7A-7P or the covering provider during after hours 7P-7A, please log into the web site [email or web address] and access using universal [place] password for that web site. If you do not have the password, please call the hospital operator.  10/12/2023, 10:07 AM

## 2023-10-13 ENCOUNTER — Inpatient Hospital Stay (HOSPITAL_COMMUNITY)

## 2023-10-13 DIAGNOSIS — I4891 Unspecified atrial fibrillation: Secondary | ICD-10-CM | POA: Diagnosis not present

## 2023-10-13 DIAGNOSIS — K6389 Other specified diseases of intestine: Secondary | ICD-10-CM | POA: Diagnosis not present

## 2023-10-13 DIAGNOSIS — K56609 Unspecified intestinal obstruction, unspecified as to partial versus complete obstruction: Secondary | ICD-10-CM | POA: Diagnosis not present

## 2023-10-13 DIAGNOSIS — I10 Essential (primary) hypertension: Secondary | ICD-10-CM | POA: Diagnosis not present

## 2023-10-13 DIAGNOSIS — I959 Hypotension, unspecified: Secondary | ICD-10-CM | POA: Diagnosis not present

## 2023-10-13 LAB — COMPREHENSIVE METABOLIC PANEL WITH GFR
ALT: 11 U/L (ref 0–44)
AST: 14 U/L — ABNORMAL LOW (ref 15–41)
Albumin: 3.3 g/dL — ABNORMAL LOW (ref 3.5–5.0)
Alkaline Phosphatase: 144 U/L — ABNORMAL HIGH (ref 38–126)
Anion gap: 9 (ref 5–15)
BUN: 45 mg/dL — ABNORMAL HIGH (ref 8–23)
CO2: 23 mmol/L (ref 22–32)
Calcium: 9.9 mg/dL (ref 8.9–10.3)
Chloride: 107 mmol/L (ref 98–111)
Creatinine, Ser: 0.82 mg/dL (ref 0.44–1.00)
Glucose, Bld: 126 mg/dL — ABNORMAL HIGH (ref 70–99)
Potassium: 4.6 mmol/L (ref 3.5–5.1)
Sodium: 139 mmol/L (ref 135–145)
Total Bilirubin: 0.2 mg/dL (ref 0.0–1.2)
Total Protein: 5.8 g/dL — ABNORMAL LOW (ref 6.5–8.1)

## 2023-10-13 LAB — CBC WITH DIFFERENTIAL/PLATELET
Abs Immature Granulocytes: 0.51 K/uL — ABNORMAL HIGH (ref 0.00–0.07)
Basophils Absolute: 0.1 K/uL (ref 0.0–0.1)
Basophils Relative: 0 %
Eosinophils Absolute: 0.1 K/uL (ref 0.0–0.5)
Eosinophils Relative: 0 %
HCT: 30.8 % — ABNORMAL LOW (ref 36.0–46.0)
Hemoglobin: 9.7 g/dL — ABNORMAL LOW (ref 12.0–15.0)
Immature Granulocytes: 3 %
Lymphocytes Relative: 9 %
Lymphs Abs: 1.8 K/uL (ref 0.7–4.0)
MCH: 31.7 pg (ref 26.0–34.0)
MCHC: 31.5 g/dL (ref 30.0–36.0)
MCV: 100.7 fL — ABNORMAL HIGH (ref 80.0–100.0)
Monocytes Absolute: 1.1 K/uL — ABNORMAL HIGH (ref 0.1–1.0)
Monocytes Relative: 6 %
Neutro Abs: 16.5 K/uL — ABNORMAL HIGH (ref 1.7–7.7)
Neutrophils Relative %: 82 %
Platelets: 214 K/uL (ref 150–400)
RBC: 3.06 MIL/uL — ABNORMAL LOW (ref 3.87–5.11)
RDW: 15.1 % (ref 11.5–15.5)
WBC: 20 K/uL — ABNORMAL HIGH (ref 4.0–10.5)
nRBC: 0 % (ref 0.0–0.2)

## 2023-10-13 LAB — CBC
HCT: 29.9 % — ABNORMAL LOW (ref 36.0–46.0)
Hemoglobin: 9.3 g/dL — ABNORMAL LOW (ref 12.0–15.0)
MCH: 31.4 pg (ref 26.0–34.0)
MCHC: 31.1 g/dL (ref 30.0–36.0)
MCV: 101 fL — ABNORMAL HIGH (ref 80.0–100.0)
Platelets: 221 K/uL (ref 150–400)
RBC: 2.96 MIL/uL — ABNORMAL LOW (ref 3.87–5.11)
RDW: 15 % (ref 11.5–15.5)
WBC: 19.9 K/uL — ABNORMAL HIGH (ref 4.0–10.5)
nRBC: 0 % (ref 0.0–0.2)

## 2023-10-13 LAB — TRIGLYCERIDES: Triglycerides: 199 mg/dL — ABNORMAL HIGH (ref <150)

## 2023-10-13 LAB — GLUCOSE, CAPILLARY
Glucose-Capillary: 120 mg/dL — ABNORMAL HIGH (ref 70–99)
Glucose-Capillary: 126 mg/dL — ABNORMAL HIGH (ref 70–99)
Glucose-Capillary: 127 mg/dL — ABNORMAL HIGH (ref 70–99)
Glucose-Capillary: 130 mg/dL — ABNORMAL HIGH (ref 70–99)
Glucose-Capillary: 133 mg/dL — ABNORMAL HIGH (ref 70–99)
Glucose-Capillary: 142 mg/dL — ABNORMAL HIGH (ref 70–99)

## 2023-10-13 LAB — HEPARIN LEVEL (UNFRACTIONATED): Heparin Unfractionated: 0.31 [IU]/mL (ref 0.30–0.70)

## 2023-10-13 LAB — MAGNESIUM: Magnesium: 2.5 mg/dL — ABNORMAL HIGH (ref 1.7–2.4)

## 2023-10-13 LAB — PHOSPHORUS: Phosphorus: 3.3 mg/dL (ref 2.5–4.6)

## 2023-10-13 MED ORDER — INSULIN ASPART 100 UNIT/ML IJ SOLN
0.0000 [IU] | Freq: Three times a day (TID) | INTRAMUSCULAR | Status: DC
  Administered 2023-10-13 – 2023-10-15 (×4): 2 [IU] via SUBCUTANEOUS

## 2023-10-13 MED ORDER — IOHEXOL 300 MG/ML  SOLN
100.0000 mL | Freq: Once | INTRAMUSCULAR | Status: AC | PRN
  Administered 2023-10-13: 100 mL via INTRAVENOUS

## 2023-10-13 MED ORDER — IOHEXOL 9 MG/ML PO SOLN
500.0000 mL | ORAL | Status: AC
  Administered 2023-10-13 (×2): 500 mL via ORAL

## 2023-10-13 MED ORDER — TRAVASOL 10 % IV SOLN
INTRAVENOUS | Status: AC
  Filled 2023-10-13: qty 889.2

## 2023-10-13 MED ORDER — PANTOPRAZOLE SODIUM 40 MG IV SOLR
40.0000 mg | Freq: Two times a day (BID) | INTRAVENOUS | Status: DC
  Administered 2023-10-13 – 2023-10-14 (×3): 40 mg via INTRAVENOUS
  Filled 2023-10-13 (×3): qty 10

## 2023-10-13 NOTE — Progress Notes (Signed)
 Physical Therapy Treatment Patient Details Name: Tracey [name] MRN: [id number] DOB: [birth year] Today's Date: 10/13/2023   History of Present Illness Patient is an 83 yo female admitted with SBO. On 10/07/2023 pt s/p diagnostic laparoscopy with LOA and repair of prior small bowel enterotomy. PMH: cognitive impairment, bipolar disorder, possible Parkinsons disease and h/o rection of GIST tumor, glaucoma, HTN    PT Comments   Patient fatigued  but willing to mobilize to  stand and pivot to recliner. Patient  should be able to progress ambulation next visit. SPo2 100% on 1 L, 90 % on RA during mobility.  Patient will benefit from continued inpatient follow up therapy, <3 hours/day      If plan is discharge home, recommend the following: A little help with walking and/or transfers;A little help with bathing/dressing/bathroom;Assistance with cooking/housework;Assist for transportation   Can travel by private vehicle        Equipment Recommendations  None recommended by PT    Recommendations for Other Services       Precautions / Restrictions Precautions Precautions: Fall Precaution/Restrictions Comments: NG tube, Recent ABD surgery Restrictions Other Position/Activity Restrictions: abdominal surgery     Mobility  Bed Mobility   Bed Mobility: Rolling, Sidelying to Sit Rolling: Min assist, Used rails Sidelying to sit: Min assist, Used rails       General bed mobility comments: min A to roll onto side and push up into sitting, through log roll for abdominal comfort, able to mobilize BLE, assistance at trunk    Transfers Overall transfer level: Needs assistance Equipment used: 2 person hand held assist Transfers: Sit to/from Stand, Bed to chair/wheelchair/BSC Sit to Stand: +2 safety/equipment, Min assist   Step pivot transfers: Mod assist, +2 physical assistance, +2 safety/equipment       General transfer comment: mod support to stand and pivot to recliner     Ambulation/Gait                   Stairs             Wheelchair Mobility     Tilt Bed    Modified Rankin (Stroke Patients Only)       Balance Overall balance assessment: Needs assistance Sitting-balance support: Feet supported Sitting balance-Leahy Scale: Fair     Standing balance support: Reliant on assistive device for balance, During functional activity, Bilateral upper extremity supported Standing balance-Leahy Scale: Poor                              Communication Communication Communication: No apparent difficulties Factors Affecting Communication: Other (comment)  Cognition Arousal: Alert Behavior During Therapy: WFL for tasks assessed/performed   PT - Cognitive impairments: History of cognitive impairments                       PT - Cognition Comments: pt pleasant, follows commands, self motivated to improve functional status        Cueing Cueing Techniques: Verbal cues  Exercises      General Comments        Pertinent Vitals/Pain Pain Assessment Pain Assessment: No/denies pain    Home Living                          Prior Function            PT Goals (current goals can now be found in the  care plan section) Progress towards PT goals: Progressing toward goals    Frequency    Min 2X/week      PT Plan      Co-evaluation              AM-PAC PT 6 Clicks Mobility   Outcome Measure  Help needed turning from your back to your side while in a flat bed without using bedrails?: A Little Help needed moving from lying on your back to sitting on the side of a flat bed without using bedrails?: A Little Help needed moving to and from a bed to a chair (including a wheelchair)?: A Little Help needed standing up from a chair using your arms (e.g., wheelchair or bedside chair)?: A Lot Help needed to walk in hospital room?: Total Help needed climbing 3-5 steps with a railing? : Total 6 Click  Score: 13    End of Session Equipment Utilized During Treatment: Oxygen Activity Tolerance: Patient tolerated treatment well Patient left: in chair;with call bell/phone within reach;with chair alarm set Nurse Communication: Mobility status PT Visit Diagnosis: Muscle weakness (generalized) (M62.81);Other abnormalities of gait and mobility (R26.89);Unsteadiness on feet (R26.81);Other symptoms and signs involving the nervous system (R29.898)     Time: 8453-8398 PT Time Calculation (min) (ACUTE ONLY): 15 min  Charges:    $Therapeutic Activity: 8-22 mins PT General Charges $$ ACUTE PT VISIT: 1 Visit                     Darice Potters PT Acute Rehabilitation Services Office [phone]    Potters Darice Norris 10/13/2023, 4:47 PM

## 2023-10-13 NOTE — Plan of Care (Signed)
  Problem: Activity: Goal: Risk for activity intolerance will decrease Outcome: Not Progressing   Problem: Nutrition: Goal: Adequate nutrition will be maintained Outcome: Not Progressing   Problem: Coping: Goal: Level of anxiety will decrease Outcome: Not Progressing   Problem: Elimination: Goal: Will not experience complications related to bowel motility Outcome: Not Progressing   Problem: Pain Managment: Goal: General experience of comfort will improve and/or be controlled Outcome: Not Progressing

## 2023-10-13 NOTE — Progress Notes (Signed)
 PHARMACY - TOTAL PARENTERAL NUTRITION CONSULT NOTE   Indication: Small bowel obstruction  Patient Measurements: Height: 5' 2 (157.5 cm) Weight: 49.2 kg (108 lb 7.5 oz) IBW/kg (Calculated) : 50.1 TPN AdjBW (KG): 50.2 Body mass index is 19.84 kg/m.  Assessment:  83 YO female presenting 9/17 with abdominal pain and distension. Initial CT A/P showed high-grade small bowel obstruction with transition point in the pelvis to the right of the midline and mild mesenteric edema, repeat CT continued to show ongoing dilated small bowel loops. Patient taken to OR 9/23 for diagnostic laparoscopy with LOA and repair of prior small bowel enterotomy. NGT in place. Pharmacy consulted for TPN management in the setting of SBO.   Glucose / Insulin : No Hx DM - CBGs all well controlled (goal 100-150) on goal rate TPN - 8 units SSI given in last 24hrs Electrolytes: Mg 2.5 (slightly elevated), all others WNL Renal: BUN rising; SCr stable WNL Hepatic: albumin  slightly low, alk phos slightly elevated 144, otherwise LFTs/Tbili WNL - TG 199 elevated I/O: NG output slightly improved yesterday; tolerated clamping trial for ~2hrs yesterday - mIVF: none currently - LBM 9/15, although some early signs of ROBF - UOP: -900 mL/24 hrs GI Imaging: - 9/17 a/p: High-grade small bowel obstruction with transition point in the pelvis to the right of midline - 9/19 DG abd: SBO pattern with no improvement since the prior CT - 9/21 CT chest/a/p: Small bowel loops remain dilated in the abdomen and pelvis. There is some diluted contrast in the dilated small bowel loops, but no discernible contrast in the colonic lumen. The degree of small-bowel dilatation appears stable to mildly progressive in the interval - 9/23 AXR: persistent SBO with mild improvement - 9/24 AXR: some decompression of SB in upper abdomen noted GI Surgeries / Procedures:  - 9/23: Exploratory laparotomy with lysis of adhesions, primary repair of small bowel  enterotomy  Central access: PICC obtained 9/23 TPN start date: 9/24  Nutritional Goals: Goal TPN rate is 65 mL/hr (provides 89 g of protein and 1619 kcals per day)  RD Assessment: Estimated Needs Total Energy Estimated Needs: 1500-1700 kcals Total Protein Estimated Needs: 75-90 grams Total Fluid Estimated Needs: >/= 1.5L  Current Nutrition:  NPO and TPN  Plan:   At 1800: Continue TPN at goal rate of 65 mL/hr Electrolytes in TPN: slightly increase Na (to prevent overcorrection), remove Mg  Na 30 mEq/L (equivalent to ~1/5 NS) K 40 mEq/L Ca 0 mEq/L Mg 0 mEq/L Phos 20 mmol/L Cl:Ac max Ac Add standard MVI and trace elements to TPN Chromium remains on hold d/t nationwide shortage Liberalize moderate-scale SSI to q8 hr given excellent control If no further changes in TPN rate/kcal, consider stopping CBGs altogether Further mIVF per MD (none currently) Thiamine  100 mg IV daily x5 days (thru 9/29) as recommended by RD Monitor TPN labs on Mon/Thurs, and PRN   Lacinda Moats, PharmD Clinical Pharmacist  9/29/20258:37 AM

## 2023-10-13 NOTE — Progress Notes (Signed)
 Progress Note  6 Days Post-Op  Subjective: Pt reports stable abdominal pain. Passing flatus but no BM. Not high output from NGT yesterday and tolerated clamping for about 2 hrs.   Objective: Vital signs in last 24 hours: Temp:  [98 F (36.7 C)-98.7 F (37.1 C)] 98.5 F (36.9 C) (09/29 0800) Pulse Rate:  [63-81] 73 (09/29 0800) Resp:  [19-32] 22 (09/29 0800) BP: (96-134)/(50-72) 120/59 (09/29 0800) SpO2:  [93 %-100 %] 99 % (09/29 0800) Weight:  [49.2 kg] 49.2 kg (09/29 0400) Last BM Date : 09/29/23  Intake/Output from previous day: 09/28 0701 - 09/29 0700 In: 1754 [I.V.:1754] Out: 1350 [Urine:900; Emesis/NG output:450] Intake/Output this shift: Total I/O In: 148.8 [P.O.:30; I.V.:118.8] Out: -   PE: General: pleasant, WD, elderly female who is laying in bed in NAD Heart: regular, rate, and rhythm. Lungs: Respiratory effort nonlabored Abd: soft, appropriately ttp, mild distention, incision C/D/I with staples present, NGT with dark bloody appearing drainage     Lab Results:  Recent Labs    10/12/23 0538 10/13/23 0420  WBC 11.8* 20.0*  HGB 11.1* 9.7*  HCT 35.2* 30.8*  PLT 223 214   BMET Recent Labs    10/12/23 0538 10/13/23 0420  NA 141 139  K 4.8 4.6  CL 105 107  CO2 23 23  GLUCOSE 133* 126*  BUN 35* 45*  CREATININE 0.82 0.82  CALCIUM 10.0 9.9   PT/INR No results for input(s): LABPROT, INR in the last 72 hours. CMP     Component Value Date/Time   NA 139 10/13/2023 0420   NA 139 01/26/2020 0000   NA 141 04/24/2012 0929   K 4.6 10/13/2023 0420   K 4.4 04/24/2012 0929   CL 107 10/13/2023 0420   CL 109 (H) 04/24/2012 0929   CO2 23 10/13/2023 0420   CO2 23 04/24/2012 0929   GLUCOSE 126 (H) 10/13/2023 0420   GLUCOSE 85 04/24/2012 0929   BUN 45 (H) 10/13/2023 0420   BUN 19 01/26/2020 0000   BUN 20.6 04/24/2012 0929   CREATININE 0.82 10/13/2023 0420   CREATININE 0.79 04/18/2023 1524   CREATININE 0.9 04/24/2012 0929   CALCIUM 9.9 10/13/2023  0420   CALCIUM 10.5 (H) 01/12/2020 2123   CALCIUM 10.0 04/24/2012 0929   PROT 5.8 (L) 10/13/2023 0420   PROT 6.7 04/24/2012 0929   ALBUMIN  3.3 (L) 10/13/2023 0420   ALBUMIN  3.5 04/24/2012 0929   AST 14 (L) 10/13/2023 0420   AST 20 04/24/2012 0929   ALT 11 10/13/2023 0420   ALT 12 04/24/2012 0929   ALKPHOS 144 (H) 10/13/2023 0420   ALKPHOS 75 04/24/2012 0929   BILITOT 0.2 10/13/2023 0420   BILITOT 0.36 04/24/2012 0929   GFRNONAA >60 10/12/2023 0538   GFRNONAA 58 (L) 05/11/2020 0700   GFRAA 68 05/11/2020 0700   Lipase     Component Value Date/Time   LIPASE 22 09/18/2013 1810       Studies/Results: No results found.  Anti-infectives: Anti-infectives (From admission, onward)    Start     Dose/Rate Route Frequency Ordered Stop   10/07/23 0915  cefoTEtan  (CEFOTAN ) 2 g in sodium chloride  0.9 % 100 mL IVPB        2 g 200 mL/hr over 30 Minutes Intravenous On call to O.R. 10/07/23 0820 10/07/23 1938        Assessment/Plan SBO POD6 s/p dx lap converted to ex lap with LOA and repair of enterotomy, Dr. Dasie 9/23 - abdominal exam fairly benign  and passing flatus but no BM - PT/OT to mobilize - needs to be oob today again - WBC up to 20K today from 11K, afebrile and HD stable  - pulm toilet, IS - CT AP with PO and IV contrast today in setting of significant increase in leukocytosis    FEN - NGT/NPO/TPN VTE - heparin  gtt  ID - cefotetan  9/23 pre-op     - Per TRH - Dementia  GERD Tremors  HTN  Anxiety  A fib RVR - on amio gtt, heparin  gtt  Anemia    LOS: 12 days    Burnard JONELLE Louder, [facility] Surgery 10/13/2023, 9:25 AM Please see Amion for pager number during day hours 7:00am-4:30pm

## 2023-10-13 NOTE — Progress Notes (Addendum)
 PHARMACY - ANTICOAGULATION CONSULT NOTE  Pharmacy Consult for heparin   Indication: atrial fibrillation  Allergies  Allergen Reactions   Lisinopril Cough   Penicillins Itching and Other (See Comments)    50 years ago   Adhesive [Tape] Itching   Atorvastatin Itching   Dilaudid  [Hydromorphone  Hcl] Itching    Patient Measurements: Height: 5' 2 (157.5 cm) Weight: 49.2 kg (108 lb 7.5 oz) IBW/kg (Calculated) : 50.1 HEPARIN  DW (KG): 50.2  Vital Signs: Temp: 98.5 F (36.9 C) (09/29 0400) Temp Source: Oral (09/29 0000) BP: 101/58 (09/29 0600) Pulse Rate: 73 (09/29 0600)  Labs: Recent Labs    10/11/23 0401 10/12/23 0538 10/13/23 0420 10/13/23 0421  HGB 10.8* 11.1* 9.7*  --   HCT 35.1* 35.2* 30.8*  --   PLT 202 223 214  --   HEPARINUNFRC 0.46 0.38  --  0.31  CREATININE 0.86 0.82  --   --     Estimated Creatinine Clearance: 41.1 mL/min (by C-G formula based on SCr of 0.82 mg/dL).   Medications:  No prior to admission anticoagulation meds listed  Assessment: 83 yo F admitted with a high grade pSBO.  Pharmacy consulted to dose Heparin  for new onset Afib with RVR. No prior to admission anticoagulation.    Today, 10/13/23 Daily heparin  level remains therapeutic and stable (0.31) on heparin  1000 units/hr Hgb slightly low but stable; Plt stable WNL No bleeding or infusion issues documented  Goal of Therapy:  Heparin  level 0.3-0.7 units/ml Monitor platelets by anticoagulation protocol: Yes   Plan:  Continue IV heparin  at 1000 units/hr Daily heparin  level & CBC while on heparin  Continue to monitor for s/sx of bleeding  F/U long-term anticoagulation plans pending surgery clearance   ADDENDUM: Patient's NG output appears bloody, possible NG suction injury, per CCS. Pantoprazole  increased to BID and pharmacy requested to hold heparin  for today.   Plan: Hold heparin  Pantoprazole  40mg  IV daily >> 40mg  BID F/u repeat CBC this afternoon (3pm)   Thank you for allowing  pharmacy to be a part of this patient's care.  Lacinda Moats, PharmD Clinical Pharmacist  9/29/20257:14 AM

## 2023-10-13 NOTE — Progress Notes (Signed)
 PROGRESS NOTE    MURRELL ELIZONDO  FMW:[id number] DOB: [birth year] DOA: 10/01/2023 PCP: Mast, Man X, NP    Chief Complaint  Patient presents with   Constipation    Brief Narrative:  Ms. Hamlett is an 83 yo female with PMH cognitive impairment, bipolar, possible Parkinson's, history of GIST who presented with abdominal pain and distension.  Initial CT A/P showed high-grade small bowel obstruction with transition point in the pelvis to the right of the midline and mild mesenteric edema.  She was treated supportively at first and NG tube was placed for decompression. Repeat CT was performed on 10/05/2023 showing ongoing dilated small bowel loops with no discernible contrast in the colon. She was eventually taken to the OR on 10/07/2023 for diagnostic laparoscopy with LOA and repair of prior small bowel enterotomy.   Assessment & Plan:   Principal Problem:   SBO (small bowel obstruction) (HCC) Active Problems:   Difficulty hearing   Essential (primary) hypertension   GERD (gastroesophageal reflux disease)   Major neurocognitive disorder (HCC)   Protein-calorie malnutrition, severe   New onset a-fib (HCC)   Hypotension   Dementia without behavioral disturbance (HCC)   Hypophosphatemia   Left ear pain   Acute postoperative anemia due to expected blood loss   Hypernatremia   Iron  deficiency anemia  #1 SBO -CT abdomen and pelvis done on admission consistent with high-grade small bowel obstruction with transition point in the pelvis to the right of the midline and mild mesenteric edema. - Status post NG tube placement for decompression. - Repeat CT scan done on 10/05/2023 with ongoing dilated small bowel loops with no discernible contrast in the colon. - Patient seen and consulted on by general surgery who followed the patient throughout the hospitalization. - Due to no improvement with conservative treatments patient was subsequently taken to the OR on 10/07/2023 for diagnostic laparoscopy  with LOA and repair of prior small bowel enterotomy. - Currently NG tube in place until bowel function returns per general surgery. -NG tube clamped on 10/12/2023 however patient with worsening abdominal pain and as such NG tube placed back on suction. - Patient started on TNA (10/08/2023 ) per general surgery. -Pulmonary toileting. -Mobilize. - Per general surgery.  2.  Leukocytosis -With a leukocytosis noted on admission with WBC initially trended up and subsequently trended down to 11.8 K from 10K from 22.4 K from 26.5 K from 7.3 K.   - Leukocytosis trending back up currently at 20K today.   -Chest x-ray obtained 10/08/2023 negative for any acute infiltrate. -Urinalysis done nitrite negative, leukocytes negative -Patient afebrile. - Blood cultures (10/09/2023 ) with no growth to date.   - Patient noted with a worsening leukocytosis, CT abdomen and pelvis ordered per general surgery for further evaluation. -Will defer initiation of antibiotics to general surgery. -Per general surgery.  3.  Hypotension -Patient noted to be hypotensive during surgery. - Patient noted with a low MAP postoperatively which was downtrending and patient received a LR bolus - Status post IV albumin  x 2 days.   - Blood pressure has improved.   - Patient with some complaints of shortness of breath, crackles noted on examination on 10/09/2023 received Lasix  40 mg IV x 1 with urine output of 3.925 L. - Hypotension resolved. - IV fluids discontinued. - Patient on TPN.  4.  Postop acute blood loss anemia/severe iron  deficiency anemia - Patient with no overt GI bleed. -Status post transfusion 1 unit PRBCs. - Hemoglobin currently at 9.7 from  11.1 from 10.8 from 10.2 from 8.0 from 13.2 on admission -Anemia panel with iron <10, TIBC of 106, folate of > 20. - Status post IV iron . -NG tube with some dark drainage concern for possible bloody drainage likely due to gastric irritation from NG tube possibly in the setting of  anticoagulation with heparin . -Hold heparin  today. -Repeat CBC this afternoon. -IV PPI increased to twice daily per general surgery. -Follow H&H. -  5.  A-fib with RVR -Currently in normal sinus rhythm with some bouts of bradycardia early on which has since resolved. - Patient was seen by cardiology prior to surgery. - Patient was on IV amiodarone  which has subsequently been discontinued. - Patient also placed on heparin  drip which was held and cleared by general surgery which was started on 10/08/2023.  - Per cardiology, once tolerating oral intake could start on amiodarone  100 mg daily and when okay with general surgery could potentially switch to Eliquis p.o. - Cardiology was following but signed off on 10/07/2023. -Patient with dark drainage from NG tube concern for possible gastric irritation from NG tube with concerns for possible bloody drainage. -Hold heparin  today. - Outpatient follow-up with cardiology.  6.  Severe protein calorie malnutrition -BMI of 20.24 kg/m. - Percent weight loss 11% in 6 months per RD. - Patient currently n.p.o. secondary to problem #1 while awaiting bowel function to return. - Patient currently on TPN per general surgery recommendations.  7.  Dementia -Stable. - Delirium precautions.  8.  Volume depletion/dehydration/hypernatremia -Patient noted to be hypotensive postoperatively was on IV fluids and IV albumin . -BP improved. -Patient with worsening hypernatremia with sodium at 154 on 10/09/2023 which improved and resolved with D5W.   -Sodium at 139 today. - D5W discontinued.   9. hypertension -Noted to be hypotensive, BP improved.. - Follow.  10.  Hypophosphatemia/hypokalemia - Phosphorus at 3.3. - Potassium at 4.6. - Electrolyte replacement per pharmacy as patient on TPN.   11.  Left ear pain -Concern for otitis externa. - Ciprodex  otic 4 drops to the left ear twice daily x 5 days.    DVT prophylaxis: Heparin >>>> SCDs Code Status:  DNR Family Communication: Updated patient.  No family at bedside. Disposition: Remain in stepdown unit  Status is: Inpatient Remains inpatient appropriate because: Severity of illness   Consultants:  General Surgery: Dr. Debby 10/01/2023 Cardiology: Dr. Levern 10/05/2023  Procedures:  PICC line placement 10/07/2023 PICC line exchange 10/08/2023 CT abdomen pelvis 10/01/2023 2D echo 10/04/2023 CT chest abdomen and pelvis 10/05/2023 Small bowel protocol 10/02/2023 Diagnostic laparoscopy, ex lap with LOA, primary repair of small bowel enterotomy per general surgery: Dr. Dasie 10/07/2023   Antimicrobials:  Anti-infectives (From admission, onward)    Start     Dose/Rate Route Frequency Ordered Stop   10/07/23 0915  cefoTEtan  (CEFOTAN ) 2 g in sodium chloride  0.9 % 100 mL IVPB        2 g 200 mL/hr over 30 Minutes Intravenous On call to O.R. 10/07/23 0820 10/07/23 1938         Subjective: Patient lying in bed, NG tube back on suction with dark drainage noted.  Patient states she feels much better today.  Denies any chest pain or shortness of breath.  Denies any abdominal pain.  Stated had some flatus today.  No bowel movement yet.    Objective: Vitals:   10/13/23 0503 10/13/23 0600 10/13/23 0700 10/13/23 0800  BP:  (!) 101/58  (!) 120/59  Pulse: 76 73 73 73  Resp: (!) 23 (!)  21 19 (!) 22  Temp:    98.5 F (36.9 C)  TempSrc:    Oral  SpO2: 100% 99% 100% 99%  Weight:      Height:        Intake/Output Summary (Last 24 hours) at 10/13/2023 0938 Last data filed at 10/13/2023 0800 Gross per 24 hour  Intake 1827.9 ml  Output 1350 ml  Net 477.9 ml   Filed Weights   10/10/23 0500 10/12/23 0500 10/13/23 0400  Weight: 54 kg 48.8 kg 49.2 kg    Examination:  General exam: NAD.  NG tube in place with dark drainage.  Respiratory system: Lungs clear to auscultation bilaterally anterior lung fields.  No wheezes, no crackles, no rhonchi.  Fair air movement.  Speaking in full sentences.   Cardiovascular system: Regular rate rhythm no murmurs rubs or gallops.  No JVD.  No lower extremity edema. Gastrointestinal system: Abdomen is soft, mildly distended, hypoactive bowel sounds, nontender to palpation.  Incision site with staples intact with no signs of infection, c/d/I.  No rebound.  No guarding.   Central nervous system: Alert and oriented.  Moving extremities spontaneously.  No focal neurological deficits. Extremities: Symmetric 5 x 5 power. Skin: No rashes, lesions or ulcers Psychiatry: Judgement and insight appear normal. Mood & affect appropriate.     Data Reviewed: I have personally reviewed following labs and imaging studies  CBC: Recent Labs  Lab 10/09/23 0357 10/09/23 1510 10/10/23 0500 10/11/23 0401 10/12/23 0538 10/13/23 0420  WBC 26.5*  --  22.4* 10.0 11.8* 20.0*  NEUTROABS  --   --  20.0* 7.7 8.9* 16.5*  HGB 8.0* 10.5* 10.2* 10.8* 11.1* 9.7*  HCT 25.5* 33.2* 31.7* 35.1* 35.2* 30.8*  MCV 104.5*  --  99.7 100.3* 100.9* 100.7*  PLT 197  --  221 202 223 214    Basic Metabolic Panel: Recent Labs  Lab 10/08/23 0317 10/09/23 0357 10/10/23 0500 10/11/23 0401 10/12/23 0538 10/13/23 0420  NA 146* 154* 149* 145 141 139  K 4.2 3.3* 3.1* 3.7 4.8 4.6  CL 114* 118* 110 106 105 107  CO2 22 24 27 28 23 23   GLUCOSE 105* 146* 141* 148* 133* 126*  BUN 16 19 22  27* 35* 45*  CREATININE 1.11* 1.01* 0.98 0.86 0.82 0.82  CALCIUM 9.4 10.2 9.9 9.9 10.0 9.9  MG 1.9 2.2 2.1 2.2  --  2.5*  PHOS 4.0 2.4* 2.8 2.3* 3.6 3.3    GFR: Estimated Creatinine Clearance: 41.1 mL/min (by C-G formula based on SCr of 0.82 mg/dL).  Liver Function Tests: Recent Labs  Lab 10/08/23 0317 10/09/23 0357 10/10/23 0500 10/11/23 0401 10/13/23 0420  AST 17 17 17 15  14*  ALT 8 6 10 8 11   ALKPHOS 36* 100 81 114 144*  BILITOT 0.4 0.4 0.4 0.3 0.2  PROT 4.8* 5.3* 5.6* 5.7* 5.8*  ALBUMIN  3.5 3.8 3.6 3.4* 3.3*    CBG: Recent Labs  Lab 10/12/23 1148 10/12/23 1652 10/13/23 0001  10/13/23 0540 10/13/23 0900  GLUCAP 130* 132* 126* 142* 120*     Recent Results (from the past 240 hours)  Culture, blood (Routine X 2) w Reflex to ID Panel     Status: None   Collection Time: 10/04/23  1:10 PM   Specimen: BLOOD LEFT ARM  Result Value Ref Range Status   Specimen Description   Final    BLOOD LEFT ARM Performed at [facility], [address], [place], [place] [address]    Special  Requests   Final    BOTTLES DRAWN AEROBIC ONLY Blood Culture results may not be optimal due to an inadequate volume of blood received in culture bottles Performed at [facility], [address] [place], [place] [address]    Culture   Final    NO GROWTH 5 DAYS Performed at [facility], [address] [place], [place] [address]    Report Status 10/09/2023 FINAL  Final  Culture, blood (Routine X 2) w Reflex to ID Panel     Status: None   Collection Time: 10/04/23  1:10 PM   Specimen: BLOOD LEFT HAND  Result Value Ref Range Status   Specimen Description   Final    BLOOD LEFT HAND Performed at [facility], [address], [place], [place] [address]    Special Requests   Final    BOTTLES DRAWN AEROBIC AND ANAEROBIC Blood Culture results may not be optimal due to an inadequate volume of blood received in culture bottles Performed at [facility], [address], [place], [place] [address]    Culture   Final    NO GROWTH 5 DAYS Performed at [facility], [address] [place], [place] [address]    Report Status 10/09/2023 FINAL  Final  MRSA Next Gen by PCR, Nasal     Status: None   Collection Time: 10/04/23  2:48 PM   Specimen: Nasal Mucosa; Nasal Swab  Result Value Ref Range Status   MRSA by PCR Next Gen NOT DETECTED NOT DETECTED Final    Comment: (NOTE) The GeneXpert MRSA Assay (FDA approved for NASAL specimens only), is one component of a comprehensive MRSA colonization surveillance program. It is not intended to  diagnose MRSA infection nor to guide or monitor treatment for MRSA infections. Test performance is not FDA approved in patients less than 15 years old. Performed at [facility], [address], [place], [place] [address]   Urine Culture (for pregnant, neutropenic or urologic patients or patients with an indwelling urinary catheter)     Status: None   Collection Time: 10/08/23  3:00 PM   Specimen: Urine, Clean Catch  Result Value Ref Range Status   Specimen Description   Final    URINE, CLEAN CATCH Performed at [facility], [address] [place], [place] [address]    Special Requests   Final    NONE Performed at [facility], [address], [place], [place] [address]    Culture   Final    NO GROWTH Performed at [facility], [address], [place], [place] [address]    Report Status 10/09/2023 FINAL  Final  Culture, blood (Routine X 2) w Reflex to ID Panel     Status: None (Preliminary result)   Collection Time: 10/09/23  8:52 AM   Specimen: BLOOD LEFT ARM  Result Value Ref Range Status   Specimen Description   Final    BLOOD LEFT ARM Performed at [facility], [address] [place], [place] [address]    Special Requests   Final    BOTTLES DRAWN AEROBIC ONLY Blood Culture results may not be optimal due to an inadequate volume of blood received in culture bottles Performed at [facility], [address] [place], [place] [address]    Culture   Final    NO GROWTH 4 DAYS Performed at [facility], [address] [place], [place] [address]    Report Status PENDING  Incomplete  Culture, blood (Routine X 2) w Reflex to ID Panel     Status: None (Preliminary result)   Collection Time: 10/09/23  9:09 AM   Specimen: BLOOD LEFT ARM  Result Value Ref Range Status   Specimen Description   Final    BLOOD LEFT ARM Performed at [facility], [address] [place], [place]  [address]    Special Requests   Final    BOTTLES DRAWN AEROBIC ONLY Blood Culture results may not be optimal due to an inadequate volume of blood received in culture bottles Performed at [facility], [address], [place], [place] [address]    Culture   Final    NO GROWTH 4 DAYS Performed at [facility], [address], [place], [place] [address]    Report Status PENDING  Incomplete         Radiology Studies: No results found.       Scheduled Meds:  acetaminophen   650 mg Oral Once   Chlorhexidine  Gluconate Cloth  6 each Topical Daily   cycloSPORINE   1 drop Both Eyes BID   insulin  aspart  0-15 Units Subcutaneous Q8H   iohexol   500 mL Oral Q1H   latanoprost   1 drop Both Eyes QHS   lidocaine   1 patch Transdermal Q24H   pantoprazole  (PROTONIX ) IV  40 mg Intravenous Q12H   sodium chloride  flush  10-40 mL Intracatheter Q12H   thiamine  (VITAMIN B1) injection  100 mg Intravenous Q24H   timolol   1 drop Both Eyes Daily   Continuous Infusions:  heparin  1,000 Units/hr (10/13/23 0800)   TPN ADULT (ION) 65 mL/hr at 10/13/23 0800     LOS: 12 days    Time spent: 40 minutes    Toribio Hummer, MD [facility]   To contact the attending provider between 7A-7P or the covering provider during after hours 7P-7A, please log into the web site [email or web address] and access using universal [place] password for that web site. If you do not have the password, please call the hospital operator.  10/13/2023, 9:38 AM

## 2023-10-13 NOTE — Plan of Care (Signed)
  Problem: Education: Goal: Knowledge of General Education information will improve Description: Including pain rating scale, medication(s)/side effects and non-pharmacologic comfort measures Outcome: Progressing   Problem: Activity: Goal: Risk for activity intolerance will decrease Outcome: Progressing   Problem: Pain Managment: Goal: General experience of comfort will improve and/or be controlled Outcome: Progressing   Problem: Health Behavior/Discharge Planning: Goal: Ability to manage health-related needs will improve Outcome: Not Progressing   Problem: Nutrition: Goal: Adequate nutrition will be maintained Outcome: Not Progressing   Problem: Coping: Goal: Level of anxiety will decrease Outcome: Not Progressing

## 2023-10-14 DIAGNOSIS — K56609 Unspecified intestinal obstruction, unspecified as to partial versus complete obstruction: Secondary | ICD-10-CM | POA: Diagnosis not present

## 2023-10-14 DIAGNOSIS — I959 Hypotension, unspecified: Secondary | ICD-10-CM | POA: Diagnosis not present

## 2023-10-14 DIAGNOSIS — I4891 Unspecified atrial fibrillation: Secondary | ICD-10-CM | POA: Diagnosis not present

## 2023-10-14 DIAGNOSIS — I10 Essential (primary) hypertension: Secondary | ICD-10-CM | POA: Diagnosis not present

## 2023-10-14 DIAGNOSIS — K567 Ileus, unspecified: Secondary | ICD-10-CM

## 2023-10-14 DIAGNOSIS — K9189 Other postprocedural complications and disorders of digestive system: Secondary | ICD-10-CM

## 2023-10-14 LAB — CULTURE, BLOOD (ROUTINE X 2)
Culture: NO GROWTH
Culture: NO GROWTH

## 2023-10-14 LAB — CBC WITH DIFFERENTIAL/PLATELET
Abs Immature Granulocytes: 0.29 K/uL — ABNORMAL HIGH (ref 0.00–0.07)
Abs Immature Granulocytes: 0.26 K/uL — ABNORMAL HIGH (ref 0.00–0.07)
Basophils Absolute: 0.1 K/uL (ref 0.0–0.1)
Basophils Absolute: 0 K/uL (ref 0.0–0.1)
Basophils Relative: 0 %
Basophils Relative: 0 %
Eosinophils Absolute: 0.1 K/uL (ref 0.0–0.5)
Eosinophils Absolute: 0.2 K/uL (ref 0.0–0.5)
Eosinophils Relative: 1 %
Eosinophils Relative: 1 %
HCT: 28.9 % — ABNORMAL LOW (ref 36.0–46.0)
HCT: 28.8 % — ABNORMAL LOW (ref 36.0–46.0)
Hemoglobin: 8.7 g/dL — ABNORMAL LOW (ref 12.0–15.0)
Hemoglobin: 9 g/dL — ABNORMAL LOW (ref 12.0–15.0)
Immature Granulocytes: 2 %
Immature Granulocytes: 2 %
Lymphocytes Relative: 8 %
Lymphocytes Relative: 9 %
Lymphs Abs: 1.2 K/uL (ref 0.7–4.0)
Lymphs Abs: 1.4 K/uL (ref 0.7–4.0)
MCH: 31.5 pg (ref 26.0–34.0)
MCH: 31.9 pg (ref 26.0–34.0)
MCHC: 30.1 g/dL (ref 30.0–36.0)
MCHC: 31.3 g/dL (ref 30.0–36.0)
MCV: 104.7 fL — ABNORMAL HIGH (ref 80.0–100.0)
MCV: 102.1 fL — ABNORMAL HIGH (ref 80.0–100.0)
Monocytes Absolute: 0.8 K/uL (ref 0.1–1.0)
Monocytes Absolute: 1 K/uL (ref 0.1–1.0)
Monocytes Relative: 5 %
Monocytes Relative: 7 %
Neutro Abs: 12.2 K/uL — ABNORMAL HIGH (ref 1.7–7.7)
Neutro Abs: 12.6 K/uL — ABNORMAL HIGH (ref 1.7–7.7)
Neutrophils Relative %: 84 %
Neutrophils Relative %: 81 %
Platelets: 232 K/uL (ref 150–400)
Platelets: 231 K/uL (ref 150–400)
RBC: 2.76 MIL/uL — ABNORMAL LOW (ref 3.87–5.11)
RBC: 2.82 MIL/uL — ABNORMAL LOW (ref 3.87–5.11)
RDW: 15.4 % (ref 11.5–15.5)
RDW: 15 % (ref 11.5–15.5)
Smear Review: NORMAL
WBC: 14.7 K/uL — ABNORMAL HIGH (ref 4.0–10.5)
WBC: 15.4 K/uL — ABNORMAL HIGH (ref 4.0–10.5)
nRBC: 0 % (ref 0.0–0.2)
nRBC: 0 % (ref 0.0–0.2)

## 2023-10-14 LAB — URINALYSIS, ROUTINE W REFLEX MICROSCOPIC
Bilirubin Urine: NEGATIVE
Glucose, UA: NEGATIVE mg/dL
Hgb urine dipstick: NEGATIVE
Ketones, ur: NEGATIVE mg/dL
Nitrite: NEGATIVE
Protein, ur: 30 mg/dL — AB
Specific Gravity, Urine: 1.017 (ref 1.005–1.030)
pH: 9 — ABNORMAL HIGH (ref 5.0–8.0)

## 2023-10-14 LAB — BASIC METABOLIC PANEL WITH GFR
Anion gap: 10 (ref 5–15)
BUN: 51 mg/dL — ABNORMAL HIGH (ref 8–23)
CO2: 22 mmol/L (ref 22–32)
Calcium: 9.9 mg/dL (ref 8.9–10.3)
Chloride: 109 mmol/L (ref 98–111)
Creatinine, Ser: 0.77 mg/dL (ref 0.44–1.00)
GFR, Estimated: >60 mL/min (ref >60)
Glucose, Bld: 110 mg/dL — ABNORMAL HIGH (ref 70–99)
Potassium: 4.9 mmol/L (ref 3.5–5.1)
Sodium: 141 mmol/L (ref 135–145)

## 2023-10-14 LAB — GLUCOSE, CAPILLARY
Glucose-Capillary: 126 mg/dL — ABNORMAL HIGH (ref 70–99)
Glucose-Capillary: 129 mg/dL — ABNORMAL HIGH (ref 70–99)
Glucose-Capillary: 130 mg/dL — ABNORMAL HIGH (ref 70–99)
Glucose-Capillary: 83 mg/dL (ref 70–99)

## 2023-10-14 LAB — PHOSPHORUS: Phosphorus: 3.7 mg/dL (ref 2.5–4.6)

## 2023-10-14 LAB — MAGNESIUM: Magnesium: 2.5 mg/dL — ABNORMAL HIGH (ref 1.7–2.4)

## 2023-10-14 LAB — HEPARIN LEVEL (UNFRACTIONATED): Heparin Unfractionated: 0.44 [IU]/mL (ref 0.30–0.70)

## 2023-10-14 MED ORDER — PROCHLORPERAZINE EDISYLATE 10 MG/2ML IJ SOLN
5.0000 mg | Freq: Once | INTRAMUSCULAR | Status: AC
  Administered 2023-10-14: 5 mg via INTRAVENOUS
  Filled 2023-10-14: qty 2

## 2023-10-14 MED ORDER — HEPARIN (PORCINE) 25000 UT/250ML-% IV SOLN
800.0000 [IU]/h | INTRAVENOUS | Status: AC
  Administered 2023-10-14 – 2023-10-15 (×2): 1000 [IU]/h via INTRAVENOUS
  Administered 2023-10-16 – 2023-10-24 (×7): 900 [IU]/h via INTRAVENOUS
  Filled 2023-10-14 (×9): qty 250

## 2023-10-14 MED ORDER — KETOROLAC TROMETHAMINE 30 MG/ML IJ SOLN
15.0000 mg | Freq: Once | INTRAMUSCULAR | Status: AC
  Administered 2023-10-14: 15 mg via INTRAVENOUS
  Filled 2023-10-14: qty 1

## 2023-10-14 MED ORDER — SODIUM CHLORIDE 0.9 % IV SOLN
2.0000 g | Freq: Two times a day (BID) | INTRAVENOUS | Status: AC
  Administered 2023-10-14 – 2023-10-23 (×20): 2 g via INTRAVENOUS
  Filled 2023-10-14 (×21): qty 12.5

## 2023-10-14 MED ORDER — KETOROLAC TROMETHAMINE 30 MG/ML IJ SOLN
15.0000 mg | Freq: Four times a day (QID) | INTRAMUSCULAR | Status: AC | PRN
  Administered 2023-10-14 – 2023-10-19 (×10): 15 mg via INTRAVENOUS
  Filled 2023-10-14 (×11): qty 1

## 2023-10-14 MED ORDER — CHLORHEXIDINE GLUCONATE CLOTH 2 % EX PADS
6.0000 | MEDICATED_PAD | Freq: Every day | CUTANEOUS | Status: DC
  Administered 2023-10-15 – 2023-10-27 (×12): 6 via TOPICAL

## 2023-10-14 MED ORDER — TRAVASOL 10 % IV SOLN
INTRAVENOUS | Status: AC
  Filled 2023-10-14: qty 889.2

## 2023-10-14 MED ORDER — METRONIDAZOLE 500 MG/100ML IV SOLN
500.0000 mg | Freq: Two times a day (BID) | INTRAVENOUS | Status: AC
  Administered 2023-10-14 – 2023-10-23 (×20): 500 mg via INTRAVENOUS
  Filled 2023-10-14 (×20): qty 100

## 2023-10-14 MED ORDER — PANTOPRAZOLE SODIUM 40 MG IV SOLR
80.0000 mg | Freq: Two times a day (BID) | INTRAVENOUS | Status: DC
  Administered 2023-10-14 – 2023-10-24 (×20): 80 mg via INTRAVENOUS
  Filled 2023-10-14 (×20): qty 20

## 2023-10-14 MED ORDER — KETOROLAC TROMETHAMINE 30 MG/ML IJ SOLN: 30.0000 mg | Freq: Once | INTRAMUSCULAR | Status: DC

## 2023-10-14 NOTE — Progress Notes (Addendum)
 PHARMACY - TOTAL PARENTERAL NUTRITION CONSULT NOTE   Indication: Small bowel obstruction  Patient Measurements: Height: 5' 2 (157.5 cm) Weight: 49.2 kg (108 lb 7.5 oz) IBW/kg (Calculated) : 50.1 TPN AdjBW (KG): 50.2 Body mass index is 19.84 kg/m.  Assessment:  83 YO female presenting 9/17 with abdominal pain and distension. Initial CT A/P showed high-grade small bowel obstruction with transition point in the pelvis to the right of the midline and mild mesenteric edema, repeat CT continued to show ongoing dilated small bowel loops. Patient taken to OR 9/23 for diagnostic laparoscopy with LOA and repair of prior small bowel enterotomy. NGT in place. Pharmacy consulted for TPN management in the setting of SBO.   Glucose / Insulin : No Hx DM - CBGs all well controlled (goal 100-150) on goal rate TPN - 6 units SSI given in last 24hrs Electrolytes: Mg 2.5 (slightly elevated), K 4.9 (upper end of normal), bicarb 22 (low end of normal), all others WNL Renal: BUN rising; SCr stable WNL Hepatic: 9/29 labs: albumin  slightly low, alk phos slightly elevated 144, otherwise LFTs/Tbili WNL - TG 199 elevated (9/29 labs) I/O: NG output slightly improved yesterday; tolerated clamping trial for ~2hrs yesterday - mIVF: none currently - LBM 9/15, although some early signs of ROBF - UOP: -550 mL/24 hrs GI Imaging: - 9/17 a/p: High-grade small bowel obstruction with transition point in the pelvis to the right of midline - 9/19 DG abd: SBO pattern with no improvement since the prior CT - 9/21 CT chest/a/p: Small bowel loops remain dilated in the abdomen and pelvis. There is some diluted contrast in the dilated small bowel loops, but no discernible contrast in the colonic lumen. The degree of small-bowel dilatation appears stable to mildly progressive in the interval - 9/23 AXR: persistent SBO with mild improvement - 9/24 AXR: some decompression of SB in upper abdomen noted 9/29 CT a/p: persistent SBO with  transition point in RLQ, signs of colitis and basilar atelectasis.  GI Surgeries / Procedures:  - 9/23: Exploratory laparotomy with lysis of adhesions, primary repair of small bowel enterotomy  Central access: PICC obtained 9/23 TPN start date: 9/24  Nutritional Goals: Goal TPN rate is 65 mL/hr (provides 89 g of protein and 1619 kcals per day)  RD Assessment: Estimated Needs Total Energy Estimated Needs: 1500-1700 kcals Total Protein Estimated Needs: 75-90 grams Total Fluid Estimated Needs: >/= 1.5L  Current Nutrition:  NPO and TPN  Plan:  At 1800: Continue TPN at goal rate of 65 mL/hr Electrolytes in TPN: slightly decrease Na given upward trend and recent hypernatremia, decrease K Na 20 mEq/L (equivalent to ~1/8 NS) K 10 mEq/L Ca 0 mEq/L Mg 0 mEq/L Phos 20 mmol/L Cl:Ac max Ac Add standard MVI and trace elements to TPN Chromium remains on hold d/t nationwide shortage Continue moderate SSI q8hr If no further changes in TPN rate/kcal, consider stopping CBGs altogether Further mIVF per MD (none currently) Thiamine  100 mg IV daily x5 days completed 9/29 Monitor TPN labs on Mon/Thurs, and PRN   Lacinda Moats, PharmD Clinical Pharmacist  9/30/20257:56 AM

## 2023-10-14 NOTE — Progress Notes (Signed)
 PROGRESS NOTE    Tracey [name]  FMW:[id number] DOB: 11-28-1940 DOA: 10/01/2023 PCP: Mast, Man X, NP    Chief Complaint  Patient presents with   Constipation    Brief Narrative:  [name] is an 83 yo female with PMH cognitive impairment, bipolar, possible Parkinson's, history of GIST who presented with abdominal pain and distension.  Initial CT A/P showed high-grade small bowel obstruction with transition point in the pelvis to the right of the midline and mild mesenteric edema.  She was treated supportively at first and NG tube was placed for decompression. Repeat CT was performed on 10/05/2023 showing ongoing dilated small bowel loops with no discernible contrast in the colon. She was eventually taken to the OR on 10/07/2023 for diagnostic laparoscopy with LOA and repair of prior small bowel enterotomy.   Assessment & Plan:   Principal Problem:   SBO (small bowel obstruction) (HCC) Active Problems:   Difficulty hearing   Essential (primary) hypertension   GERD (gastroesophageal reflux disease)   Major neurocognitive disorder (HCC)   Protein-calorie malnutrition, severe   New onset a-fib (HCC)   Hypotension   Dementia without behavioral disturbance (HCC)   Hypophosphatemia   Left ear pain   Acute postoperative anemia due to expected blood loss   Hypernatremia   Iron  deficiency anemia  #1 SBO -CT abdomen and pelvis done on admission consistent with high-grade small bowel obstruction with transition point in the pelvis to the right of the midline and mild mesenteric edema. - Status post NG tube placement for decompression. - Repeat CT scan done on 10/05/2023 with ongoing dilated small bowel loops with no discernible contrast in the colon. - Patient seen and consulted on by general surgery who followed the patient throughout the hospitalization. - Due to no improvement with conservative treatments patient was subsequently taken to the OR on 10/07/2023 for diagnostic laparoscopy  with LOA and repair of prior small bowel enterotomy. - Currently NG tube in place until bowel function returns per general surgery. -NG tube clamped on 10/12/2023 however patient with worsening abdominal pain and as such NG tube placed back on suction. - Patient started on TNA (10/08/2023 ) per general surgery. -Due to worsening leukocytosis, CT abdomen and pelvis done on 10/13/2023 which showed persistent SBO with transition point in the right lower quadrant.  Descending and sigmoid colon decompressed with new mild regional inflammatory/edematous changes and scattered peripheral enhancing small fluid collections since prior, suggesting colitis.  Small to moderate bilateral pleural effusions right greater than left with dependent basilar atelectasis.  -Patient started on IV cefepime and Flagyl today per general surgery. -General surgery feels patient likely has a postop ileus. -Pulmonary toileting. -Mobilize. - Per general surgery.  2.  Probable postop ileus -Keep electrolytes with potassium > 4, magnesium  approximately 2. - Mobilize. -See problem #1. - Per general surgery.  3.  Leukocytosis -With a leukocytosis noted on admission with WBC initially trended up and subsequently trended down to 11.8 K from 10K from 22.4 K from 26.5 K from 7.3 K.   - Leukocytosis trending back up currently at 15.4 K today.   -Chest x-ray obtained 10/08/2023 negative for any acute infiltrate. -Urinalysis done nitrite negative, leukocytes negative -Patient afebrile. - Blood cultures (10/09/2023 ) with no growth to date.   - Patient noted with a worsening leukocytosis, CT abdomen and pelvis done on 10/13/2023 which showed persistent SBO with transition point in the right lower quadrant.  Descending and sigmoid colon decompressed with new mild regional  inflammatory/edematous changes and scattered peripheral enhancing small fluid collections since prior, suggesting colitis.  Small to moderate bilateral pleural effusions  right greater than left with dependent basilar atelectasis.  - General Surgery feel patient with a likely postop ileus.  - Patient to be started on IV cefepime and Flagyl today per general surgery. -Per general surgery.  4.  Hypotension -Patient noted to be hypotensive during surgery. - Patient noted with a low MAP postoperatively which was downtrending and patient received a LR bolus - Status post IV albumin  x 2 days.   - Blood pressure has improved.   - Patient with some complaints of shortness of breath, crackles noted on examination on 10/09/2023 received Lasix  40 mg IV x 1 with urine output of 3.925 L. - Hypotension resolved. - IV fluids discontinued. - Patient on TPN.  5.  Postop acute blood loss anemia/severe iron  deficiency anemia - Patient with no overt GI bleed. -Status post transfusion 1 unit PRBCs. - Hemoglobin currently at 9.7 from 11.1 from 10.8 from 10.2 from 8.0 from 13.2 on admission -Anemia panel with iron <10, TIBC of 106, folate of > 20. - Status post IV iron . -NG tube with some dark drainage concern for possible bloody drainage likely due to gastric irritation from NG tube possibly in the setting of anticoagulation with heparin  on 10/13/2023. -Hemoglobin remained stable and at 9.0 today. -Resume heparin  today. -Continue IV PPI twice daily. -Follow H&H. -  6.  A-fib with RVR -Currently in normal sinus rhythm with some bouts of bradycardia early on which has since resolved. - Patient was seen by cardiology prior to surgery. - Patient was on IV amiodarone  which has subsequently been discontinued. - Patient also placed on heparin  drip which was held and cleared by general surgery which was started on 10/08/2023.  - Per cardiology, once tolerating oral intake could start on amiodarone  100 mg daily and when okay with general surgery could potentially switch to Eliquis p.o. - Cardiology was following but signed off on 10/07/2023. -Patient with dark drainage from NG tube  concern for possible gastric irritation from NG tube with concerns for possible bloody drainage on 10/13/2023 and as such heparin  was held. -Hemoglobin stable at 9.0 today. -Heparin  resumed. - Outpatient follow-up with cardiology.  7.  Severe protein calorie malnutrition -BMI of 20.24 kg/m. - Percent weight loss 11% in 6 months per RD. - Patient currently n.p.o. secondary to problem #1 while awaiting bowel function to return. - Patient currently on TPN per general surgery recommendations.  8.  Dementia -Stable. - Delirium precautions.  9.  Volume depletion/dehydration/hypernatremia -Patient noted to be hypotensive postoperatively was on IV fluids and IV albumin . -BP improved. -Patient with worsening hypernatremia with sodium at 154 on 10/09/2023 which improved and resolved with D5W.   -Sodium at 141 today.  10. hypertension -Noted to be hypotensive, BP improved. -BP soft this morning. - Follow.  11.  Hypophosphatemia/hypokalemia - Phosphorus at 3.7. - Potassium at 4.9. - Electrolyte replacement per pharmacy as patient on TPN.   12.  Left ear pain -Concern for otitis externa. - Ciprodex  otic 4 drops to the left ear twice daily x 5 days.  13.  Headache -Compazine  5 mg IV x 1 followed by Toradol  15 mg x 1.    DVT prophylaxis: Heparin   Code Status: DNR Family Communication: Updated patient.  No family at bedside. Disposition: Remain in stepdown unit  Status is: Inpatient Remains inpatient appropriate because: Severity of illness   Consultants:  General Surgery: Dr.  Thomas 10/01/2023 Cardiology: Dr. Levern 10/05/2023  Procedures:  PICC line placement 10/07/2023 PICC line exchange 10/08/2023 CT abdomen pelvis 10/01/2023, 10/13/2023 2D echo 10/04/2023 CT chest abdomen and pelvis 10/05/2023, Small bowel protocol 10/02/2023 Diagnostic laparoscopy, ex lap with LOA, primary repair of small bowel enterotomy per general surgery: Dr. Dasie 10/07/2023   Antimicrobials:   Anti-infectives (From admission, onward)    Start     Dose/Rate Route Frequency Ordered Stop   10/14/23 0930  ceFEPIme (MAXIPIME) 2 g in sodium chloride  0.9 % 100 mL IVPB        2 g 200 mL/hr over 30 Minutes Intravenous Every 12 hours 10/14/23 0820     10/14/23 0900  metroNIDAZOLE (FLAGYL) IVPB 500 mg        500 mg 100 mL/hr over 60 Minutes Intravenous 2 times daily 10/14/23 0811     10/07/23 0915  cefoTEtan  (CEFOTAN ) 2 g in sodium chloride  0.9 % 100 mL IVPB        2 g 200 mL/hr over 30 Minutes Intravenous On call to O.R. 10/07/23 0820 10/07/23 1938         Subjective: Patient laying in bed.  Patient complaining of headache, chest pain, abdominal pain, pain all over.  Patient with some complaints of shortness of breath.  Passing some flatus.  No bowel movement.    Objective: Vitals:   10/14/23 0000 10/14/23 0200 10/14/23 0410 10/14/23 0800  BP: (!) 109/48 (!) 118/56 (!) 109/48   Pulse: 67 71 68   Resp: 20 (!) 30 17   Temp:   98 F (36.7 C) (!) 97.2 F (36.2 C)  TempSrc:    Axillary  SpO2: 99% 100% 100%   Weight:      Height:        Intake/Output Summary (Last 24 hours) at 10/14/2023 1020 Last data filed at 10/14/2023 0746 Gross per 24 hour  Intake 1188.75 ml  Output 1200 ml  Net -11.25 ml   Filed Weights   10/10/23 0500 10/12/23 0500 10/13/23 0400  Weight: 54 kg 48.8 kg 49.2 kg    Examination:  General exam: NAD.  NG tube in place with dark drainage.  Respiratory system: CTAB.  No wheezes, no crackles, no rhonchi.  Fair air movement.  Speaking in full sentences.  Normal respiratory effort.  Cardiovascular system: RRR no murmurs rubs or gallops.  No JVD.  No pitting lower extremity edema.  Gastrointestinal system: Abdomen is soft, mildly distended, hypoactive bowel sounds.  Diffuse tenderness to palpation.  Incision site with staples intact with no erythema or discharge.  No rebound.  No guarding.    Central nervous system: Alert and oriented.  Moving extremities  spontaneously.  No focal neurological deficits. Extremities: Symmetric 5 x 5 power. Skin: No rashes, lesions or ulcers Psychiatry: Judgement and insight appear normal. Mood & affect appropriate.     Data Reviewed: I have personally reviewed following labs and imaging studies  CBC: Recent Labs  Lab 10/11/23 0401 10/12/23 0538 10/13/23 0420 10/13/23 1532 10/14/23 0331 10/14/23 0451  WBC 10.0 11.8* 20.0* 19.9* 14.7* 15.4*  NEUTROABS 7.7 8.9* 16.5*  --  12.2* 12.6*  HGB 10.8* 11.1* 9.7* 9.3* 8.7* 9.0*  HCT 35.1* 35.2* 30.8* 29.9* 28.9* 28.8*  MCV 100.3* 100.9* 100.7* 101.0* 104.7* 102.1*  PLT 202 223 214 221 232 231    Basic Metabolic Panel: Recent Labs  Lab 10/09/23 0357 10/10/23 0500 10/11/23 0401 10/12/23 0538 10/13/23 0420 10/14/23 0451  NA 154* 149* 145 141 139 141  K 3.3* 3.1* 3.7 4.8 4.6 4.9  CL 118* 110 106 105 107 109  CO2 24 27 28 23 23 22   GLUCOSE 146* 141* 148* 133* 126* 110*  BUN 19 22 27* 35* 45* 51*  CREATININE 1.01* 0.98 0.86 0.82 0.82 0.77  CALCIUM 10.2 9.9 9.9 10.0 9.9 9.9  MG 2.2 2.1 2.2  --  2.5* 2.5*  PHOS 2.4* 2.8 2.3* 3.6 3.3 3.7    GFR: Estimated Creatinine Clearance: 42.1 mL/min (by C-G formula based on SCr of 0.77 mg/dL).  Liver Function Tests: Recent Labs  Lab 10/08/23 0317 10/09/23 0357 10/10/23 0500 10/11/23 0401 10/13/23 0420  AST 17 17 17 15  14*  ALT 8 6 10 8 11   ALKPHOS 36* 100 81 114 144*  BILITOT 0.4 0.4 0.4 0.3 0.2  PROT 4.8* 5.3* 5.6* 5.7* 5.8*  ALBUMIN  3.5 3.8 3.6 3.4* 3.3*    CBG: Recent Labs  Lab 10/13/23 1625 10/13/23 2324 10/13/23 2344 10/14/23 0424 10/14/23 0820  GLUCAP 133* 127* 130* 126* 130*     Recent Results (from the past 240 hours)  Culture, blood (Routine X 2) w Reflex to ID Panel     Status: None   Collection Time: 10/04/23  1:10 PM   Specimen: BLOOD LEFT ARM  Result Value Ref Range Status   Specimen Description   Final    BLOOD LEFT ARM Performed at [facility], [address] [place], [place] [address]    Special Requests   Final    BOTTLES DRAWN AEROBIC ONLY Blood Culture results may not be optimal due to an inadequate volume of blood received in culture bottles Performed at [facility], [address], [place], [place] [address]    Culture   Final    NO GROWTH 5 DAYS Performed at [facility], [address] [place], [place] [address]    Report Status 10/09/2023 FINAL  Final  Culture, blood (Routine X 2) w Reflex to ID Panel     Status: None   Collection Time: 10/04/23  1:10 PM   Specimen: BLOOD LEFT HAND  Result Value Ref Range Status   Specimen Description   Final    BLOOD LEFT HAND Performed at [facility], [address] [place], [place] [address]    Special Requests   Final    BOTTLES DRAWN AEROBIC AND ANAEROBIC Blood Culture results may not be optimal due to an inadequate volume of blood received in culture bottles Performed at [facility], [address] [place], [place] [address]    Culture   Final    NO GROWTH 5 DAYS Performed at [facility], [address], [place], [place] [address]    Report Status 10/09/2023 FINAL  Final  MRSA Next Gen by PCR, Nasal     Status: None   Collection Time: 10/04/23  2:48 PM   Specimen: Nasal Mucosa; Nasal Swab  Result Value Ref Range Status   MRSA by PCR Next Gen NOT DETECTED NOT DETECTED Final    Comment: (NOTE) The GeneXpert MRSA Assay (FDA approved for NASAL specimens only), is one component of a comprehensive MRSA colonization surveillance program. It is not intended to diagnose MRSA infection nor to guide or monitor treatment for MRSA infections. Test performance is not FDA approved in patients less than 19 years old. Performed at [facility], [address] [place], [place] [address]   Urine Culture (for pregnant, neutropenic  or urologic patients or patients with an indwelling urinary catheter)     Status: None    Collection Time: 10/08/23  3:00 PM   Specimen: Urine, Clean Catch  Result Value Ref Range Status   Specimen Description   Final    URINE, CLEAN CATCH Performed at [facility], [address] [place], [place] [address]    Special Requests   Final    NONE Performed at [facility], [address] [place], [place] [address]    Culture   Final    NO GROWTH Performed at [facility], [address], [place], [place] [address]    Report Status 10/09/2023 FINAL  Final  Culture, blood (Routine X 2) w Reflex to ID Panel     Status: None   Collection Time: 10/09/23  8:52 AM   Specimen: BLOOD LEFT ARM  Result Value Ref Range Status   Specimen Description   Final    BLOOD LEFT ARM Performed at [facility], [address], [place], [place] [address]    Special Requests   Final    BOTTLES DRAWN AEROBIC ONLY Blood Culture results may not be optimal due to an inadequate volume of blood received in culture bottles Performed at [facility], [address], [place], [place] [address]    Culture   Final    NO GROWTH 5 DAYS Performed at [facility], [address], [place], [place] [address]    Report Status 10/14/2023 FINAL  Final  Culture, blood (Routine X 2) w Reflex to ID Panel     Status: None   Collection Time: 10/09/23  9:09 AM   Specimen: BLOOD LEFT ARM  Result Value Ref Range Status   Specimen Description   Final    BLOOD LEFT ARM Performed at [facility], [address] [place], [place] [address]    Special Requests   Final    BOTTLES DRAWN AEROBIC ONLY Blood Culture results may not be optimal due to an inadequate volume of blood received in culture bottles Performed at [facility], [address], [place], [place] [address]    Culture   Final    NO GROWTH 5 DAYS Performed at [facility], [address] [place], [place] [address]    Report Status 10/14/2023 FINAL   Final         Radiology Studies: CT ABDOMEN PELVIS W CONTRAST Result Date: 10/13/2023 EXAM: CT ABDOMEN AND PELVIS WITH CONTRAST 10/13/2023 12:06:33 PM TECHNIQUE: CT of the abdomen and pelvis was performed with the administration of 100 mL of iohexol  (OMNIPAQUE ) 300 MG/ML solution. Multiplanar reformatted images are provided for review. Automated exposure control, iterative reconstruction, and/or weight-based adjustment of the mA/kV was utilized to reduce the radiation dose to as low as reasonably achievable. COMPARISON: 10/05/2023 CLINICAL HISTORY: Abdominal pain, post-op. Evaluate progress of high-grade small bowel obstruction with transition point in the pelvis to the right of the midline and mild mesenteric edema. FINDINGS: LOWER CHEST: Slight increase in small/moderate effusions right greater than left. Dependent atelectasis posteriorly in the lung bases. Moderate hiatal hernia is partially visualized. LIVER: Stable probable small hepatic cysts in right hepatic lobe. No new lesion. GALLBLADDER AND BILE DUCTS: Gallbladder is physiologically distended. No biliary ductal dilatation. SPLEEN: No acute abnormality. PANCREAS: No acute abnormality. ADRENAL GLANDS: No acute abnormality. KIDNEYS, URETERS AND BLADDER: Multiple cortical lesions in kidneys, some of which can be  characterized as simple cysts, largest 12 mm mid left kidney. Per consensus, no follow-up is needed for simple Bosniak type 1 and 2 renal cysts, unless the patient has a malignancy history or risk factors. No stones in the kidneys or ureters. No hydronephrosis. No perinephric or periureteral stranding. The urinary bladder is distended. GI AND BOWEL: Gastric tube extends into the stomach, which is partially distended by gas and contrast material. Multiple dilated proximal and mid small bowel loops with transition point in the right lower quadrant. Decompressed loops of distal small bowel. There is physiologic distention of the right colon. The  descending and sigmoid segments of the colon are decompressed with some mild regional inflammatory/edematous changes, new since previous, and regional scattered peripheral enhancing small fluid collections. PERITONEUM AND RETROPERITONEUM: No ascites. No free air. Scattered peripheral enhancing small fluid collections are noted, particularly in the region of the descending and sigmoid colon. VASCULATURE: Aorta is normal in caliber. Mild scattered iliac calcified plaque without aneurysm. LYMPH NODES: No lymphadenopathy. REPRODUCTIVE ORGANS: No acute abnormality. BONES AND SOFT TISSUES: Midline skin staples below the umbilicus. Surgical clips in the right pelvis as before. Degenerative disc disease L5-S1. No acute osseous abnormality. No focal soft tissue abnormality. IMPRESSION: 1. Persistent small bowel obstruction with transition point in the right lower quadrant. 2. descending and sigmoid colon decompressed with new mild regional inflammatory/edematous changes and scattered peripheral enhancing small fluid collections since prior, suggesting colitis. 3. Small to moderate bilateral pleural effusions, right greater than left, with dependent basilar atelectasis. Electronically signed by: Dayne Hassell MD 10/13/2023 04:05 PM EDT RP Workstation: [id number]         Scheduled Meds:  acetaminophen   650 mg Oral Once   Chlorhexidine  Gluconate Cloth  6 each Topical Daily   cycloSPORINE   1 drop Both Eyes BID   insulin  aspart  0-15 Units Subcutaneous Q8H   latanoprost   1 drop Both Eyes QHS   lidocaine   1 patch Transdermal Q24H   pantoprazole  (PROTONIX ) IV  40 mg Intravenous Q12H   sodium chloride  flush  10-40 mL Intracatheter Q12H   timolol   1 drop Both Eyes Daily   Continuous Infusions:  ceFEPime (MAXIPIME) IV 2 g (10/14/23 1018)   heparin  1,000 Units/hr (10/14/23 0843)   metronidazole Stopped (10/14/23 1018)   TPN ADULT (ION) 65 mL/hr at 10/13/23 2100   TPN ADULT (ION)       LOS: 13 days    Time  spent: 40 minutes    Toribio Hummer, MD [facility]   To contact the attending provider between 7A-7P or the covering provider during after hours 7P-7A, please log into the web site [email or web address] and access using universal [place] password for that web site. If you do not have the password, please call the hospital operator.  10/14/2023, 10:20 AM

## 2023-10-14 NOTE — Progress Notes (Signed)
 PHARMACY - ANTICOAGULATION CONSULT NOTE  Pharmacy Consult for heparin   Indication: atrial fibrillation  Allergies  Allergen Reactions   Lisinopril Cough   Penicillins Itching and Other (See Comments)    50 years ago   Adhesive [Tape] Itching   Atorvastatin Itching   Dilaudid  [Hydromorphone  Hcl] Itching    Patient Measurements: Height: 5' 2 (157.5 cm) Weight: 49.2 kg (108 lb 7.5 oz) IBW/kg (Calculated) : 50.1 HEPARIN  DW (KG): 50.2  Vital Signs: Temp: 98 F (36.7 C) (09/30 0410) Temp Source: Axillary (09/29 2300) BP: 109/48 (09/30 0410) Pulse Rate: 68 (09/30 0410)  Labs: Recent Labs    10/12/23 0538 10/13/23 0420 10/13/23 0421 10/13/23 1532 10/14/23 0331 10/14/23 0451  HGB 11.1* 9.7*  --  9.3* 8.7* 9.0*  HCT 35.2* 30.8*  --  29.9* 28.9* 28.8*  PLT 223 214  --  221 232 231  HEPARINUNFRC 0.38  --  0.31  --   --   --   CREATININE 0.82 0.82  --   --   --  0.77    Estimated Creatinine Clearance: 42.1 mL/min (by C-G formula based on SCr of 0.77 mg/dL).   Medications:  No prior to admission anticoagulation meds listed  Assessment: 83 yo F admitted with a high grade pSBO.  Pharmacy consulted to dose Heparin  for new onset Afib with RVR. No prior to admission anticoagulation.    Heparin  held 9/29 given bloody appearing NG output upon CCS evaluation (possible NG suction injury). Repeat CBC later in the afternoon showed continued down-trend in hgb (8.7) but has since improved this AM (9.0).   Today, 10/14/23 Hgb 9.0 - slightly low but stable/improved; Plt stable WNL Per CCS, not planning to place a drain today so okay to resume heparin  this morning given improved CBC No bleeding or infusion issues documented  Goal of Therapy:  Heparin  level 0.3-0.7 units/ml Monitor platelets by anticoagulation protocol: Yes   Plan:  Resume IV heparin  at previously therapeutic rate of 1000 units/hr Check heparin  level 8hrs after resuming heparin  Daily heparin  level & CBC while  on heparin  Continue to monitor for s/sx of bleeding  F/U long-term anticoagulation plans pending surgery clearance    Thank you for allowing pharmacy to be a part of this patient's care.  Lacinda Moats, PharmD Clinical Pharmacist  9/30/20258:12 AM

## 2023-10-14 NOTE — Progress Notes (Signed)
 PHARMACY - ANTICOAGULATION CONSULT NOTE  Pharmacy Consult for heparin   Indication: atrial fibrillation  Allergies  Allergen Reactions   Lisinopril Cough   Penicillins Itching and Other (See Comments)    50 years ago   Adhesive [Tape] Itching   Atorvastatin Itching   Dilaudid  [Hydromorphone  Hcl] Itching    Patient Measurements: Height: 5' 2 (157.5 cm) Weight: 49.2 kg (108 lb 7.5 oz) IBW/kg (Calculated) : 50.1 HEPARIN  DW (KG): 50.2  Vital Signs: Temp: 96.5 F (35.8 C) (09/30 1200) Temp Source: Axillary (09/30 1200) BP: 117/58 (09/30 1600) Pulse Rate: 62 (09/30 1600)  Labs: Recent Labs    10/12/23 0538 10/13/23 0420 10/13/23 0421 10/13/23 1532 10/14/23 0331 10/14/23 0451 10/14/23 1615  HGB 11.1* 9.7*  --  9.3* 8.7* 9.0*  --   HCT 35.2* 30.8*  --  29.9* 28.9* 28.8*  --   PLT 223 214  --  221 232 231  --   HEPARINUNFRC 0.38  --  0.31  --   --   --  0.44  CREATININE 0.82 0.82  --   --   --  0.77  --     Estimated Creatinine Clearance: 42.1 mL/min (by C-G formula based on SCr of 0.77 mg/dL).   Medications:  No prior to admission anticoagulation meds listed  Assessment: 83 yo F admitted with a high grade pSBO.  Pharmacy consulted to dose Heparin  for new onset Afib with RVR. No prior to admission anticoagulation.    Heparin  held 9/29 given bloody appearing NG output upon CCS evaluation (possible NG suction injury). Repeat CBC later in the afternoon showed continued down-trend in hgb (8.7) but has since improved this AM (9.0).   Today, 10/14/23 Hgb 9.0 - slightly low but stable/improved; Plt stable WNL Per CCS, not planning to place a drain today so okay to resume heparin  this morning given improved CBC No bleeding or infusion issues documented   This afternoon,  16:15 heparin  level 0.44, therapeutic, with IV heparin  infusing at 1000 units/hr No bleeding or complications documented other than progress note from this morning stating NGT with less bloody  appearing drainage  Goal of Therapy:  Heparin  level 0.3-0.7 units/ml Monitor platelets by anticoagulation protocol: Yes   Plan:  Continue IV heparin  at 1000 units/hr Monitor daily heparin  level, CBC, signs/symptoms of bleeding F/U long-term anticoagulation plans pending surgery clearance    Thank you for allowing pharmacy to be a part of this patient's care.  Eleanor EMERSON Agent, PharmD, BCPS Clinical Pharmacist [place] 10/14/2023 5:18 PM

## 2023-10-14 NOTE — Progress Notes (Signed)
 Nutrition Follow-up  DOCUMENTATION CODES:   Severe malnutrition in context of chronic illness  INTERVENTION:  - Continue goal TPN.             - TPN management per pharmacy.    - Daily weights while on TPN.  - Will monitor for diet advancement.   NUTRITION DIAGNOSIS:   Severe Malnutrition related to chronic illness as evidenced by severe fat depletion, severe muscle depletion, percent weight loss (11% in 6 months). *ongoing  GOAL:   Patient will meet greater than or equal to 90% of their needs *met with TPN  MONITOR:   Diet advancement, Labs, Weight trends, I & O's  REASON FOR ASSESSMENT:   NPO/Clear Liquid Diet (x5 days)    ASSESSMENT:   83 y.o. female with PMH significant for dementia, cognitive impairment, bipolar disorder, possible Parkinsons disease and h/o rection of GIST tumor who presented with complaints of abdominal pain nausea vomiting and a lot of abdominal distention. Admitted for small bowel obstruction.  9/17 Admit; NPO; NGT placed 9/23 s/p ex-lap with lysis of adhesions, repair of small bowel enterotomy  9/24 TPN initiated 9/26 TPN increased to goal 9/29 CT A/P showing persistent SBO; Surgery feels patient has post-op ileus  Patient remains on goal TPN meeting 100% of needs. Unable to have diet advanced due to suspected post-op ileus. Still no BM (last >2 weeks ago). Plan for repeat CT scan later this week. Will monitor for diet advancement.    Admit weight: 110# Current weight: 108# I&O's: +3.9L since admit   Medications reviewed and include: Protonix    Labs reviewed:  - Triglycerides 199 (as of 9/29)   Diet Order:   Diet Order             Diet NPO time specified Except for: Ice Chips  Diet effective now                   EDUCATION NEEDS:  No education needs have been identified at this time  Skin:  Skin Assessment: Reviewed RN Assessment  Last BM:  PTA  Height:  Ht Readings from Last 1 Encounters:  10/07/23 5' 2  (1.575 m)   Weight:  Wt Readings from Last 1 Encounters:  10/13/23 49.2 kg   BMI:  Body mass index is 19.84 kg/m.  Estimated Nutritional Needs:  Kcal:  1500-1700 kcals Protein:  75-90 grams Fluid:  >/= 1.5L    Trude Ned RD, LDN Contact via Secure Chat.

## 2023-10-14 NOTE — Progress Notes (Signed)
 Occupational Therapy Treatment Patient Details Name: Tracey [name] MRN: [id number] DOB: 05-31-1940 Today's Date: 10/14/2023   History of present illness Patient is an 83 yo female admitted with SBO. On 10/07/2023 pt s/p diagnostic laparoscopy with LOA and repair of prior small bowel enterotomy. PMH: cognitive impairment, bipolar disorder, possible Parkinsons disease and h/o rection of GIST tumor, glaucoma, HTN   OT comments  The pt was seen for functional strengthening and progression of ADL participation. She was assisted into sitting edge of bed where she performed hair brushing with set-up assist. She subsequently required min assist to stand using a RW, as well as for taking lateral steps along the edge of the bed. She denied having pain and she presented with good effort and participation. Continue OT plan of care. Patient will benefit from continued inpatient follow up therapy, <3 hours/day.       If plan is discharge home, recommend the following:  A lot of help with bathing/dressing/bathroom;Assistance with cooking/housework;A little help with walking and/or transfers   Equipment Recommendations  Other (comment) (defer to next setting, pending functional progress)    Recommendations for Other Services      Precautions / Restrictions Precautions Precautions: Fall Precaution/Restrictions Comments:  (NG tube) Restrictions Other Position/Activity Restrictions: abdominal surgery       Mobility Bed Mobility Overal bed mobility: Needs Assistance Bed Mobility: Supine to Sit, Sit to Supine     Supine to sit: Min assist, Used rails, HOB elevated Sit to supine: Mod assist (required assist for BLE back onto the bed)   General bed mobility comments: She was instructed on implementing the log roll technique for performing supine to sit    Transfers Overall transfer level: Needs assistance Equipment used: Rolling walker (2 wheels) Transfers: Sit to/from Stand Sit to Stand: Min  assist, From elevated surface           General transfer comment: Once in standing, she was instructed to demo trunk extension. She further required min assist for taking lateral steps along the EOB using a RW; intermittent cues provided for walker placement     Balance     Sitting balance-Leahy Scale: Fair       Standing balance-Leahy Scale: Poor                             ADL either performed or assessed with clinical judgement   ADL Overall ADL's : Needs assistance/impaired     Grooming: Set up;Supervision/safety;Sitting Grooming Details (indicate cue type and reason): She performed hair brushing in sitting at the edge of the bed.          Communication Communication Communication: No apparent difficulties   Cognition Arousal: Alert Behavior During Therapy: WFL for tasks assessed/performed Cognition: No apparent impairments             OT - Cognition Comments: able to follow 1 step commands consistently                 Following commands: Intact        Cueing   Cueing Techniques: Verbal cues             Pertinent Vitals/ Pain       Pain Assessment Pain Assessment: No/denies pain   Frequency  Min 2X/week        Progress Toward Goals  OT Goals(current goals can now be found in the care plan section)  Progress towards OT goals: Progressing  toward goals  Acute Rehab OT Goals OT Goal Formulation: With patient Time For Goal Achievement: 10/22/23 Potential to Achieve Goals: Good  Plan         AM-PAC OT 6 Clicks Daily Activity     Outcome Measure   Help from another person eating meals?: Total (NPO except for ice chips) Help from another person taking care of personal grooming?: A Little Help from another person toileting, which includes using toliet, bedpan, or urinal?: A Lot Help from another person bathing (including washing, rinsing, drying)?: A Lot Help from another person to put on and taking off regular upper  body clothing?: A Little Help from another person to put on and taking off regular lower body clothing?: A Little 6 Click Score: 14    End of Session Equipment Utilized During Treatment: Rolling walker (2 wheels);Oxygen  OT Visit Diagnosis: Unsteadiness on feet (R26.81);Other abnormalities of gait and mobility (R26.89);Muscle weakness (generalized) (M62.81)   Activity Tolerance Patient tolerated treatment well   Patient Left in bed;with call bell/phone within reach;with family/visitor present   Nurse Communication Other (comment) (nurse cleared the pt for therapy participation)        Time: 8457-8395 OT Time Calculation (min): 22 min  Charges: OT General Charges $OT Visit: 1 Visit OT Treatments $Therapeutic Activity: 8-22 mins     Delanna JINNY Lesches, OTR/L 10/14/2023, 5:27 PM

## 2023-10-14 NOTE — Progress Notes (Signed)
 Progress Note  7 Days Post-Op  Subjective: Pt reports more burning this AM along with dysuria. Discussed CT with IR this AM and agree pelvic collection is developing but too early to drain at this time. I shared this discussion with patient. Having some urinary retention it seems also.   Objective: Vital signs in last 24 hours: Temp:  [97.2 F (36.2 C)-98.2 F (36.8 C)] 97.2 F (36.2 C) (09/30 0800) Pulse Rate:  [56-74] 56 (09/30 1000) Resp:  [17-32] 18 (09/30 1000) BP: (82-131)/(40-79) 82/40 (09/30 1000) SpO2:  [97 %-100 %] 100 % (09/30 1000) Last BM Date : 09/29/23  Intake/Output from previous day: 09/29 0701 - 09/30 0700 In: 1981 [P.O.:30; I.V.:951; NG/GT:1000] Out: 1200 [Urine:1050; Emesis/NG output:150] Intake/Output this shift: Total I/O In: 20 [NG/GT:20] Out: -   PE: General: pleasant, WD, elderly female, NAD Heart: regular, rate, and rhythm. Lungs: Respiratory effort nonlabored Abd: soft, appropriately ttp, mild distention, incision C/D/I with staples present, NGT with less bloody appearing drainage     Lab Results:  Recent Labs    10/14/23 0331 10/14/23 0451  WBC 14.7* 15.4*  HGB 8.7* 9.0*  HCT 28.9* 28.8*  PLT 232 231   BMET Recent Labs    10/13/23 0420 10/14/23 0451  NA 139 141  K 4.6 4.9  CL 107 109  CO2 23 22  GLUCOSE 126* 110*  BUN 45* 51*  CREATININE 0.82 0.77  CALCIUM 9.9 9.9   PT/INR No results for input(s): LABPROT, INR in the last 72 hours. CMP     Component Value Date/Time   NA 141 10/14/2023 0451   NA 139 01/26/2020 0000   NA 141 04/24/2012 0929   K 4.9 10/14/2023 0451   K 4.4 04/24/2012 0929   CL 109 10/14/2023 0451   CL 109 (H) 04/24/2012 0929   CO2 22 10/14/2023 0451   CO2 23 04/24/2012 0929   GLUCOSE 110 (H) 10/14/2023 0451   GLUCOSE 85 04/24/2012 0929   BUN 51 (H) 10/14/2023 0451   BUN 19 01/26/2020 0000   BUN 20.6 04/24/2012 0929   CREATININE 0.77 10/14/2023 0451   CREATININE 0.79 04/18/2023 1524    CREATININE 0.9 04/24/2012 0929   CALCIUM 9.9 10/14/2023 0451   CALCIUM 10.5 (H) 01/12/2020 2123   CALCIUM 10.0 04/24/2012 0929   PROT 5.8 (L) 10/13/2023 0420   PROT 6.7 04/24/2012 0929   ALBUMIN  3.3 (L) 10/13/2023 0420   ALBUMIN  3.5 04/24/2012 0929   AST 14 (L) 10/13/2023 0420   AST 20 04/24/2012 0929   ALT 11 10/13/2023 0420   ALT 12 04/24/2012 0929   ALKPHOS 144 (H) 10/13/2023 0420   ALKPHOS 75 04/24/2012 0929   BILITOT 0.2 10/13/2023 0420   BILITOT 0.36 04/24/2012 0929   GFRNONAA >60 10/14/2023 0451   GFRNONAA 58 (L) 05/11/2020 0700   GFRAA 68 05/11/2020 0700   Lipase     Component Value Date/Time   LIPASE 22 09/18/2013 1810       Studies/Results: CT ABDOMEN PELVIS W CONTRAST Result Date: 10/13/2023 EXAM: CT ABDOMEN AND PELVIS WITH CONTRAST 10/13/2023 12:06:33 PM TECHNIQUE: CT of the abdomen and pelvis was performed with the administration of 100 mL of iohexol  (OMNIPAQUE ) 300 MG/ML solution. Multiplanar reformatted images are provided for review. Automated exposure control, iterative reconstruction, and/or weight-based adjustment of the mA/kV was utilized to reduce the radiation dose to as low as reasonably achievable. COMPARISON: 10/05/2023 CLINICAL HISTORY: Abdominal pain, post-op. Evaluate progress of high-grade small bowel obstruction with transition point  in the pelvis to the right of the midline and mild mesenteric edema. FINDINGS: LOWER CHEST: Slight increase in small/moderate effusions right greater than left. Dependent atelectasis posteriorly in the lung bases. Moderate hiatal hernia is partially visualized. LIVER: Stable probable small hepatic cysts in right hepatic lobe. No new lesion. GALLBLADDER AND BILE DUCTS: Gallbladder is physiologically distended. No biliary ductal dilatation. SPLEEN: No acute abnormality. PANCREAS: No acute abnormality. ADRENAL GLANDS: No acute abnormality. KIDNEYS, URETERS AND BLADDER: Multiple cortical lesions in kidneys, some of which can be  characterized as simple cysts, largest 12 mm mid left kidney. Per consensus, no follow-up is needed for simple Bosniak type 1 and 2 renal cysts, unless the patient has a malignancy history or risk factors. No stones in the kidneys or ureters. No hydronephrosis. No perinephric or periureteral stranding. The urinary bladder is distended. GI AND BOWEL: Gastric tube extends into the stomach, which is partially distended by gas and contrast material. Multiple dilated proximal and mid small bowel loops with transition point in the right lower quadrant. Decompressed loops of distal small bowel. There is physiologic distention of the right colon. The descending and sigmoid segments of the colon are decompressed with some mild regional inflammatory/edematous changes, new since previous, and regional scattered peripheral enhancing small fluid collections. PERITONEUM AND RETROPERITONEUM: No ascites. No free air. Scattered peripheral enhancing small fluid collections are noted, particularly in the region of the descending and sigmoid colon. VASCULATURE: Aorta is normal in caliber. Mild scattered iliac calcified plaque without aneurysm. LYMPH NODES: No lymphadenopathy. REPRODUCTIVE ORGANS: No acute abnormality. BONES AND SOFT TISSUES: Midline skin staples below the umbilicus. Surgical clips in the right pelvis as before. Degenerative disc disease L5-S1. No acute osseous abnormality. No focal soft tissue abnormality. IMPRESSION: 1. Persistent small bowel obstruction with transition point in the right lower quadrant. 2. descending and sigmoid colon decompressed with new mild regional inflammatory/edematous changes and scattered peripheral enhancing small fluid collections since prior, suggesting colitis. 3. Small to moderate bilateral pleural effusions, right greater than left, with dependent basilar atelectasis. Electronically signed by: Katheleen Faes MD 10/13/2023 04:05 PM EDT RP Workstation: [id number]     Anti-infectives: Anti-infectives (From admission, onward)    Start     Dose/Rate Route Frequency Ordered Stop   10/14/23 0930  ceFEPIme (MAXIPIME) 2 g in sodium chloride  0.9 % 100 mL IVPB        2 g 200 mL/hr over 30 Minutes Intravenous Every 12 hours 10/14/23 0820     10/14/23 0900  metroNIDAZOLE (FLAGYL) IVPB 500 mg        500 mg 100 mL/hr over 60 Minutes Intravenous 2 times daily 10/14/23 0811     10/07/23 0915  cefoTEtan  (CEFOTAN ) 2 g in sodium chloride  0.9 % 100 mL IVPB        2 g 200 mL/hr over 30 Minutes Intravenous On call to O.R. 10/07/23 0820 10/07/23 1938        Assessment/Plan SBO POD7 s/p dx lap converted to ex lap with LOA and repair of enterotomy, Dr. Dasie 9/23 - PT/OT to mobilize - needs to be oob today again - WBC up to 20K yesterday, 15K this AM, afebrile and HD stable  - pulm toilet, IS - CT AP 9/29 reviewed independently and with IR - pelvic collection does appear to be developing and seems to communicate with other small collections but is too early to drain - will start abx today and likely plan to rescan later this week - continue TPN and  NGT to LIWS today  Acute urinary retention - place foley for urinary retention and send UA and UCx with report of dysuria as well  - likely secondary to pelvic abscess   FEN - NGT/NPO/TPN VTE - heparin  gtt  ID - cefotetan  9/23 pre-op; cefepime/flagyl 9/30>> Foley - placed today, UA and UCx pending      - Per [facility] - Dementia  GERD Tremors  HTN  Anxiety  A fib RVR - on amio gtt, heparin  gtt  Anemia    LOS: 13 days    Burnard JONELLE Louder, [facility] Surgery 10/14/2023, 10:59 AM Please see Amion for pager number during day hours 7:00am-4:30pm

## 2023-10-15 DIAGNOSIS — H9193 Unspecified hearing loss, bilateral: Secondary | ICD-10-CM | POA: Diagnosis not present

## 2023-10-15 DIAGNOSIS — F0390 Unspecified dementia without behavioral disturbance: Secondary | ICD-10-CM | POA: Diagnosis not present

## 2023-10-15 DIAGNOSIS — D62 Acute posthemorrhagic anemia: Secondary | ICD-10-CM | POA: Diagnosis not present

## 2023-10-15 DIAGNOSIS — K56609 Unspecified intestinal obstruction, unspecified as to partial versus complete obstruction: Secondary | ICD-10-CM | POA: Diagnosis not present

## 2023-10-15 LAB — BASIC METABOLIC PANEL WITH GFR
Anion gap: 10 (ref 5–15)
BUN: 52 mg/dL — ABNORMAL HIGH (ref 8–23)
CO2: 19 mmol/L — ABNORMAL LOW (ref 22–32)
Calcium: 9.6 mg/dL (ref 8.9–10.3)
Chloride: 113 mmol/L — ABNORMAL HIGH (ref 98–111)
Creatinine, Ser: 0.71 mg/dL (ref 0.44–1.00)
GFR, Estimated: >60 mL/min (ref >60)
Glucose, Bld: 109 mg/dL — ABNORMAL HIGH (ref 70–99)
Potassium: 4 mmol/L (ref 3.5–5.1)
Sodium: 142 mmol/L (ref 135–145)

## 2023-10-15 LAB — CBC WITH DIFFERENTIAL/PLATELET
Abs Immature Granulocytes: 0.12 K/uL — ABNORMAL HIGH (ref 0.00–0.07)
Basophils Absolute: 0 K/uL (ref 0.0–0.1)
Basophils Relative: 0 %
Eosinophils Absolute: 0.2 K/uL (ref 0.0–0.5)
Eosinophils Relative: 2 %
HCT: 26.7 % — ABNORMAL LOW (ref 36.0–46.0)
Hemoglobin: 8.1 g/dL — ABNORMAL LOW (ref 12.0–15.0)
Immature Granulocytes: 1 %
Lymphocytes Relative: 13 %
Lymphs Abs: 1.4 K/uL (ref 0.7–4.0)
MCH: 31 pg (ref 26.0–34.0)
MCHC: 30.3 g/dL (ref 30.0–36.0)
MCV: 102.3 fL — ABNORMAL HIGH (ref 80.0–100.0)
Monocytes Absolute: 0.8 K/uL (ref 0.1–1.0)
Monocytes Relative: 8 %
Neutro Abs: 7.9 K/uL — ABNORMAL HIGH (ref 1.7–7.7)
Neutrophils Relative %: 76 %
Platelets: 256 K/uL (ref 150–400)
RBC: 2.61 MIL/uL — ABNORMAL LOW (ref 3.87–5.11)
RDW: 15.2 % (ref 11.5–15.5)
WBC: 10.4 K/uL (ref 4.0–10.5)
nRBC: 0 % (ref 0.0–0.2)

## 2023-10-15 LAB — MAGNESIUM: Magnesium: 2.4 mg/dL (ref 1.7–2.4)

## 2023-10-15 LAB — GLUCOSE, CAPILLARY
Glucose-Capillary: 107 mg/dL — ABNORMAL HIGH (ref 70–99)
Glucose-Capillary: 111 mg/dL — ABNORMAL HIGH (ref 70–99)
Glucose-Capillary: 121 mg/dL — ABNORMAL HIGH (ref 70–99)

## 2023-10-15 LAB — PHOSPHORUS: Phosphorus: 3.5 mg/dL (ref 2.5–4.6)

## 2023-10-15 LAB — HEPARIN LEVEL (UNFRACTIONATED): Heparin Unfractionated: 0.53 [IU]/mL (ref 0.30–0.70)

## 2023-10-15 MED ORDER — INSULIN ASPART 100 UNIT/ML IJ SOLN
0.0000 [IU] | Freq: Three times a day (TID) | INTRAMUSCULAR | Status: DC
  Administered 2023-10-15: 2 [IU] via SUBCUTANEOUS

## 2023-10-15 MED ORDER — MUSCLE RUB 10-15 % EX CREA
TOPICAL_CREAM | CUTANEOUS | Status: DC | PRN
  Administered 2023-10-15 (×2): 1 via TOPICAL
  Filled 2023-10-15: qty 85

## 2023-10-15 MED ORDER — TRAVASOL 10 % IV SOLN
INTRAVENOUS | Status: AC
  Filled 2023-10-15: qty 889.2

## 2023-10-15 NOTE — Progress Notes (Addendum)
 PHARMACY - TOTAL PARENTERAL NUTRITION CONSULT NOTE   Indication: Small bowel obstruction and likely post-op ileus  Patient Measurements: Height: 5' 2 (157.5 cm) Weight: 50.2 kg (110 lb 10.7 oz) IBW/kg (Calculated) : 50.1 TPN AdjBW (KG): 50.2 Body mass index is 20.24 kg/m.  Assessment:  83 YO female presenting 9/17 with abdominal pain and distension. Initial CT A/P showed high-grade small bowel obstruction with transition point in the pelvis to the right of the midline and mild mesenteric edema, repeat CT continued to show ongoing dilated small bowel loops. Patient taken to OR 9/23 for diagnostic laparoscopy with LOA and repair of prior small bowel enterotomy. NGT in place. Pharmacy consulted for TPN management in the setting of SBO.   Glucose / Insulin : No Hx DM - CBGs well controlled (goal 100-150) on goal rate TPN - 4 units SSI given in last 24hrs Electrolytes: Mg 2.4 (upper end of normal, improved), bicarb 19 (low), Cl 113 (elevated), all others WNL Renal: BUN rising; SCr stable WNL Hepatic: 9/29 labs: albumin  slightly low, alk phos slightly elevated 144, otherwise LFTs/Tbili WNL - TG 199 elevated (9/29 labs) I/O:  - Ng output: -250 mL/24 hrs - mIVF: none currently - LBM 9/15, reports of flatus - UOP: -2000 mL/24 hrs GI Imaging: - 9/17 a/p: High-grade small bowel obstruction with transition point in the pelvis to the right of midline - 9/19 DG abd: SBO pattern with no improvement since the prior CT - 9/21 CT chest/a/p: Small bowel loops remain dilated in the abdomen and pelvis. There is some diluted contrast in the dilated small bowel loops, but no discernible contrast in the colonic lumen. The degree of small-bowel dilatation appears stable to mildly progressive in the interval - 9/23 AXR: persistent SBO with mild improvement - 9/24 AXR: some decompression of SB in upper abdomen noted 9/29 CT a/p: persistent SBO with transition point in RLQ, signs of colitis and basilar  atelectasis.  GI Surgeries / Procedures:  - 9/23: Exploratory laparotomy with lysis of adhesions, primary repair of small bowel enterotomy  Central access: PICC obtained 9/23 TPN start date: 9/24  Nutritional Goals: Goal TPN rate is 65 mL/hr (provides 89 g of protein and 1619 kcals per day)  RD Assessment: Estimated Needs Total Energy Estimated Needs: 1500-1700 kcals Total Protein Estimated Needs: 75-90 grams Total Fluid Estimated Needs: >/= 1.5L  Current Nutrition:  NPO and TPN  Plan:  At 1800: Continue TPN at goal rate of 65 mL/hr Electrolytes in TPN:  Na 20 mEq/L K 30 mEq/L - increased Ca 0 mEq/L Mg 0 mEq/L Phos 20 mmol/L Cl:Ac max Ac - may need to consider starting bicarb gtt if bicarb continues to down-trend despite maximizing acetate in TPN. Add standard MVI and trace elements to TPN Chromium remains on hold d/t nationwide shortage Decrease to sensitive SSI q8hrs given excellent glucose control Further mIVF per MD (none currently) Monitor TPN labs on Mon/Thurs, and PRN   Lacinda Moats, PharmD Clinical Pharmacist  10/1/20258:13 AM

## 2023-10-15 NOTE — Plan of Care (Signed)

## 2023-10-15 NOTE — Progress Notes (Signed)
 PROGRESS NOTE  Tracey [name] FMW:[id number] DOB: [birth year]   PCP: Mast, Man X, NP  Patient is from: Home.  DOA: 10/01/2023 LOS: 14  Chief complaints Chief Complaint  Patient presents with   Constipation     Brief Narrative / Interim history: 83 year old F with PMH of cognitive impairment, possible Parkinson's disease, GIST, bipolar disorder and chronic neck pain presenting with abdominal pain and distention and admitted with SBO.  CT showed high-grade SBO with transition point in the pelvis to the right of the midline and mild mesenteric edema.  She failed conservative management with NG tube decompression.  Repeat CT on 9/21 with ongoing SBO .  Eventually, she underwent diagnostic laparoscopy with LOA and repair of prior small bowel enterotomy on 9/23.  Hospital course complicated by postop ileus requiring NG tube and TPN.  She also had worsening leukocytosis.  Repeat CT abdomen and pelvis on 9/29 showed persistent SBO with transition point in RLQ, new mild regional inflammatory/edematous change as well as scattered peripheral enhancing small fluid collections concerning for colitis.  Patient was started on IV cefepime and Flagyl by general surgery.  Subjective: Seen and examined earlier this afternoon.  Complains neck pain.  She rates her pain 10/10.  However, she states that she has neck pain for many years.  Denies acute change.  Denies weakness or numbness in her arms.  Also reports mild abdominal pain.  Objective: Vitals:   10/15/23 0800 10/15/23 0900 10/15/23 1000 10/15/23 1058  BP: (!) 125/55 (!) 114/52 (!) 117/47 (!) 124/59  Pulse: 62 (!) 58 (!) 50 (!) 54  Resp: 19 (!) 21 20 17   Temp: 97.9 F (36.6 C)   98.1 F (36.7 C)  TempSrc: Oral   Oral  SpO2: 100% 100% 100% 100%  Weight:      Height:        Examination:  GENERAL: Sitting on bedside chair. HEENT: MMM.  Vision and hearing grossly intact.  NG tube in place. NECK: No focal tenderness.  No swelling.  Somewhat  limited ROM. RESP:  No IWOB.  Fair aeration bilaterally. CVS:  RRR. Heart sounds normal.  ABD/GI/GU: BS+. Abd soft, NTND.  Foley catheter in place. MSK/EXT:  Moves extremities. No apparent deformity. No edema.  SKIN: no apparent skin lesion or wound NEURO: AA.  Oriented appropriately.  No apparent focal neuro deficit. PSYCH: Calm. Normal affect.   Consultants:  Neurosurgery  Procedures: 9/23-diagnostic laparoscopy, LOA and repair of prior small bowel enterotomy   Microbiology summarized: 9/20-MRSA PCR screen nonreactive 9/20-blood cultures negative. 9/25-blood cultures negative. 9/30-urine culture reintubated for better gross  Assessment and plan: Small bowel obstruction: Presents with abdominal pain and distention.  CT confirmed high-grade SBO with transition point in RLQ.  Failed conservative management with NG tube decompression.  Repeat CT on 9/21 with persistent SBO.  Underwent diagnostic laparoscopy, LOA and repair of prior small bowel enterotomy on 9/23.  Postop was complicated by prolonged ileus, possible colitis and intra-abdominal abscesses as noted on repeat CT on 9/29. -Continue IV cefepime and Flagyl per general surgery -Continue TPN and NGT to LIWS   Postoperative blood loss anemia? Noted some fluctuation but seems to be at baseline.  Received IV iron  earlier in the course. Recent Labs    10/09/23 0357 10/09/23 1510 10/10/23 0500 10/11/23 0401 10/12/23 0538 10/13/23 0420 10/13/23 1532 10/14/23 0331 10/14/23 0451 10/15/23 0402  HGB 8.0* 10.5* 10.2* 10.8* 11.1* 9.7* 9.3* 8.7* 9.0* 8.1*  - Continue monitoring - Continue IV Protonix  -  Monitor H&H.  Paroxysmal A-fib with RVR: Likely in the setting of #1.  Required IV amiodarone  at 1 point. -Cardiology recommended resuming p.o. amiodarone  100 mg daily when able to take p.o. - On IV heparin  for anticoagulation - Cardiology signed off - Optimize electrolytes  Possible postoperative ileus: -Management as  above. - Mobilize patient  Hypotension: Normotensive for most part. - Continue monitoring  Dementia without behavioral disturbance: - Reorientation and delirium precaution.  Hypokalemia/hypophosphatemia - Pharmacy replenishing with TPN.   Chronic neck pain: Suspect cervicalgia.  Reports significant posterior neck pain for years.  Somewhat limited range of motion - Continue pain meds including muscle relaxers - Add Voltaren  gel.  Concern for otitis externa -Ciprodex  otic 4 drops to the left ear twice daily x 5 days.   Tension headache: -Compazine  5 mg IV x 1 followed by Toradol  15 mg x 1.  Acute urinary retention - Continue Foley catheter  UTI?  UA concerning.  Urine culture incubating - Already on antibiotics - Follow urine culture   Severe malnutrition Body mass index is 20.24 kg/m. Nutrition Problem: Severe Malnutrition Etiology: chronic illness Signs/Symptoms: severe fat depletion, severe muscle depletion, percent weight loss (11% in 6 months) Percent weight loss: 11 % (in 6 months) Interventions: Refer to RD note for recommendations   DVT prophylaxis:  Place and maintain sequential compression device Start: 10/13/23 0945 SCDs Start: 10/01/23 2349  Code Status: DNR. Family Communication: None at bedside. Level of care: Progressive Status is: Inpatient Remains inpatient appropriate because: SBO, postop ileus, intra-abdominal infection   Final disposition: SNF   55 minutes with more than 50% spent in reviewing records, counseling patient/family and coordinating care.   Sch Meds:  Scheduled Meds:  acetaminophen   650 mg Oral Once   Chlorhexidine  Gluconate Cloth  6 each Topical QHS   cycloSPORINE   1 drop Both Eyes BID   insulin  aspart  0-9 Units Subcutaneous Q8H   latanoprost   1 drop Both Eyes QHS   lidocaine   1 patch Transdermal Q24H   pantoprazole  (PROTONIX ) IV  80 mg Intravenous Q12H   sodium chloride  flush  10-40 mL Intracatheter Q12H   timolol   1  drop Both Eyes Daily   Continuous Infusions:  ceFEPime (MAXIPIME) IV Stopped (10/15/23 1004)   heparin  1,000 Units/hr (10/15/23 1252)   metronidazole Stopped (10/15/23 1203)   TPN ADULT (ION) 65 mL/hr at 10/15/23 1000   TPN ADULT (ION)     PRN Meds:.artificial tears, ketorolac , LORazepam , methocarbamol  (ROBAXIN ) injection, morphine  injection, Muscle Rub, ondansetron  **OR** ondansetron  (ZOFRAN ) IV, mouth rinse, sodium chloride  flush  Antimicrobials: Anti-infectives (From admission, onward)    Start     Dose/Rate Route Frequency Ordered Stop   10/14/23 0930  ceFEPIme (MAXIPIME) 2 g in sodium chloride  0.9 % 100 mL IVPB        2 g 200 mL/hr over 30 Minutes Intravenous Every 12 hours 10/14/23 0820     10/14/23 0900  metroNIDAZOLE (FLAGYL) IVPB 500 mg        500 mg 100 mL/hr over 60 Minutes Intravenous 2 times daily 10/14/23 0811     10/07/23 0915  cefoTEtan  (CEFOTAN ) 2 g in sodium chloride  0.9 % 100 mL IVPB        2 g 200 mL/hr over 30 Minutes Intravenous On call to O.R. 10/07/23 0820 10/07/23 1938        I have personally reviewed the following labs and images: CBC: Recent Labs  Lab 10/12/23 0538 10/13/23 0420 10/13/23 1532 10/14/23 0331 10/14/23  0451 10/15/23 0402  WBC 11.8* 20.0* 19.9* 14.7* 15.4* 10.4  NEUTROABS 8.9* 16.5*  --  12.2* 12.6* 7.9*  HGB 11.1* 9.7* 9.3* 8.7* 9.0* 8.1*  HCT 35.2* 30.8* 29.9* 28.9* 28.8* 26.7*  MCV 100.9* 100.7* 101.0* 104.7* 102.1* 102.3*  PLT 223 214 221 232 231 256   BMP &GFR Recent Labs  Lab 10/10/23 0500 10/11/23 0401 10/12/23 0538 10/13/23 0420 10/14/23 0451 10/15/23 0402  NA 149* 145 141 139 141 142  K 3.1* 3.7 4.8 4.6 4.9 4.0  CL 110 106 105 107 109 113*  CO2 27 28 23 23 22  19*  GLUCOSE 141* 148* 133* 126* 110* 109*  BUN 22 27* 35* 45* 51* 52*  CREATININE 0.98 0.86 0.82 0.82 0.77 0.71  CALCIUM 9.9 9.9 10.0 9.9 9.9 9.6  MG 2.1 2.2  --  2.5* 2.5* 2.4  PHOS 2.8 2.3* 3.6 3.3 3.7 3.5   Estimated Creatinine Clearance: 42.9  mL/min (by C-G formula based on SCr of 0.71 mg/dL). Liver & Pancreas: Recent Labs  Lab 10/09/23 0357 10/10/23 0500 10/11/23 0401 10/13/23 0420  AST 17 17 15  14*  ALT 6 10 8 11   ALKPHOS 100 81 114 144*  BILITOT 0.4 0.4 0.3 0.2  PROT 5.3* 5.6* 5.7* 5.8*  ALBUMIN  3.8 3.6 3.4* 3.3*   No results for input(s): LIPASE, AMYLASE in the last 168 hours. No results for input(s): AMMONIA in the last 168 hours. Diabetic: No results for input(s): HGBA1C in the last 72 hours. Recent Labs  Lab 10/14/23 0424 10/14/23 0820 10/14/23 1614 10/14/23 2352 10/15/23 0757  GLUCAP 126* 130* 83 129* 111*   Cardiac Enzymes: No results for input(s): CKTOTAL, CKMB, CKMBINDEX, TROPONINI in the last 168 hours. Recent Labs    10/04/23 1310  PROBNP 529.0*   Coagulation Profile: No results for input(s): INR, PROTIME in the last 168 hours. Thyroid  Function Tests: No results for input(s): TSH, T4TOTAL, FREET4, T3FREE, THYROIDAB in the last 72 hours. Lipid Profile: Recent Labs    10/13/23 0420  TRIG 199*   Anemia Panel: No results for input(s): VITAMINB12, FOLATE, FERRITIN, TIBC, IRON , RETICCTPCT in the last 72 hours. Urine analysis:    Component Value Date/Time   COLORURINE YELLOW 10/14/2023 1049   APPEARANCEUR HAZY (A) 10/14/2023 1049   LABSPEC 1.017 10/14/2023 1049   PHURINE 9.0 (H) 10/14/2023 1049   GLUCOSEU NEGATIVE 10/14/2023 1049   HGBUR NEGATIVE 10/14/2023 1049   BILIRUBINUR NEGATIVE 10/14/2023 1049   KETONESUR NEGATIVE 10/14/2023 1049   PROTEINUR 30 (A) 10/14/2023 1049   NITRITE NEGATIVE 10/14/2023 1049   LEUKOCYTESUR SMALL (A) 10/14/2023 1049   Sepsis Labs: Invalid input(s): PROCALCITONIN, LACTICIDVEN  Microbiology: Recent Results (from the past 240 hours)  Urine Culture (for pregnant, neutropenic or urologic patients or patients with an indwelling urinary catheter)     Status: None   Collection Time: 10/08/23  3:00 PM   Specimen:  Urine, Clean Catch  Result Value Ref Range Status   Specimen Description   Final    URINE, CLEAN CATCH Performed at [facility], [address] [place], [place] [address]    Special Requests   Final    NONE Performed at [facility], [address] [place], [place] [address]    Culture   Final    NO GROWTH Performed at [facility], [address], [place], [place] [address]    Report Status 10/09/2023 FINAL  Final  Culture, blood (Routine X 2) w Reflex to  ID Panel     Status: None   Collection Time: 10/09/23  8:52 AM   Specimen: BLOOD LEFT ARM  Result Value Ref Range Status   Specimen Description   Final    BLOOD LEFT ARM Performed at [facility], [address] [place], [place] [address]    Special Requests   Final    BOTTLES DRAWN AEROBIC ONLY Blood Culture results may not be optimal due to an inadequate volume of blood received in culture bottles Performed at [facility], [address], [place], [place] [address]    Culture   Final    NO GROWTH 5 DAYS Performed at [facility], [address], [place], [place] [address]    Report Status 10/14/2023 FINAL  Final  Culture, blood (Routine X 2) w Reflex to ID Panel     Status: None   Collection Time: 10/09/23  9:09 AM   Specimen: BLOOD LEFT ARM  Result Value Ref Range Status   Specimen Description   Final    BLOOD LEFT ARM Performed at [facility], [address], [place], [place] [address]    Special Requests   Final    BOTTLES DRAWN AEROBIC ONLY Blood Culture results may not be optimal due to an inadequate volume of blood received in culture bottles Performed at [facility], [address] [place], [place] [address]    Culture   Final    NO GROWTH 5 DAYS Performed at [facility], [address] [place], [place] [address]    Report Status 10/14/2023 FINAL  Final  Urine Culture (for pregnant,  neutropenic or urologic patients or patients with an indwelling urinary catheter)     Status: None (Preliminary result)   Collection Time: 10/14/23 10:49 AM   Specimen: Urine, Clean Catch  Result Value Ref Range Status   Specimen Description   Final    URINE, CLEAN CATCH Performed at [facility], [address], [place], [place] [address]    Special Requests   Final    NONE Performed at [facility], [address] [place], [place] [address]    Culture   Final    CULTURE REINCUBATED FOR BETTER GROWTH Performed at [facility], [address] [place], [place] [address]    Report Status PENDING  Incomplete    Radiology Studies: No results found.    Tai Skelly T. Abrea Henle [facility]  If 7PM-7AM, please contact night-coverage [email or web address] 10/15/2023, 2:45 PM

## 2023-10-15 NOTE — Progress Notes (Signed)
 PHARMACY - ANTICOAGULATION CONSULT NOTE  Pharmacy Consult for heparin   Indication: atrial fibrillation  Allergies  Allergen Reactions   Lisinopril Cough   Penicillins Itching and Other (See Comments)    50 years ago   Adhesive [Tape] Itching   Atorvastatin Itching   Dilaudid  [Hydromorphone  Hcl] Itching    Patient Measurements: Height: 5' 2 (157.5 cm) Weight: 50.2 kg (110 lb 10.7 oz) IBW/kg (Calculated) : 50.1 HEPARIN  DW (KG): 50.2  Vital Signs: Temp: 97.9 F (36.6 C) (10/01 0400) Temp Source: Oral (10/01 0400) BP: 99/40 (10/01 0600) Pulse Rate: 55 (10/01 0600)  Labs: Recent Labs    10/13/23 0420 10/13/23 0421 10/13/23 1532 10/14/23 0331 10/14/23 0451 10/14/23 1615 10/15/23 0402  HGB 9.7*  --    < > 8.7* 9.0*  --  8.1*  HCT 30.8*  --    < > 28.9* 28.8*  --  26.7*  PLT 214  --    < > 232 231  --  256  HEPARINUNFRC  --  0.31  --   --   --  0.44 0.53  CREATININE 0.82  --   --   --  0.77  --  0.71   < > = values in this interval not displayed.    Estimated Creatinine Clearance: 42.9 mL/min (by C-G formula based on SCr of 0.71 mg/dL).   Medications:  No prior to admission anticoagulation meds listed  Assessment: 83 yo F admitted with a high grade pSBO.  Pharmacy consulted to dose Heparin  for new onset Afib with RVR. No prior to admission anticoagulation.    Heparin  held 9/29 given bloody appearing NG output upon CCS evaluation (possible NG suction injury). Repeat CBC later in the afternoon showed continued down-trend in hgb (8.7) but had improved 9/30 AM (9.0).   Today, 10/15/23 Heparin  level 0.53--therapeutic on heparin  1000 units/hr Hgb 8.1 - low; Plt stable WNL No bleeding or infusion issues, per RN  Goal of Therapy:  Heparin  level 0.3-0.7 units/ml Monitor platelets by anticoagulation protocol: Yes   Plan:  Continue IV heparin  at 1000 units/hr Daily heparin  level & CBC while on heparin  Continue to monitor for s/sx of bleeding  F/U long-term  anticoagulation plans pending surgery clearance    Thank you for allowing pharmacy to be a part of this patient's care.  Lacinda Moats, PharmD Clinical Pharmacist  10/1/20258:19 AM

## 2023-10-15 NOTE — TOC Progression Note (Signed)
 Transition of Care [facility]) - Progression Note    Patient Details  Name: Tracey [name] MRN: [id number] Date of Birth: Feb 21, 1940  [facility] CM/SW Contact  Jon ONEIDA Anon, RN Phone Number: 10/15/2023, 10:40 AM  Clinical Narrative:    Pt on TPN, heparin  drip, NG tube in place to suction. Pt not medically stable to discharge. PT/OT recommending STR at DC. Plan is to go to [facility] for STR, once medically ready. ICM continuing to follow.   Expected Discharge Plan: Skilled Nursing Facility Barriers to Discharge: Continued Medical Work up               Expected Discharge Plan and Services   Discharge Planning Services: CM Consult Post Acute Care Choice: Skilled Nursing Facility Living arrangements for the past 2 months: Assisted Living Facility                                       Social Drivers of Health (SDOH) Interventions SDOH Screenings   Food Insecurity: No Food Insecurity (10/02/2023)  Housing: Low Risk  (10/02/2023)  Transportation Needs: No Transportation Needs (10/02/2023)  Utilities: Not At Risk (10/02/2023)  Depression (PHQ2-9): Low Risk  (10/02/2023)  Social Connections: Moderately Integrated (10/02/2023)  Tobacco Use: Low Risk  (10/07/2023)    Readmission Risk Interventions     No data to display

## 2023-10-15 NOTE — Progress Notes (Signed)
 Physical Therapy Treatment Patient Details Name: Tracey [name] MRN: [id number] DOB: 31-Aug-1940 Today's Date: 10/15/2023   History of Present Illness Patient is an 83 yo female admitted with SBO. On 10/07/2023 pt s/p diagnostic laparoscopy with LOA and repair of prior small bowel enterotomy. PMH: cognitive impairment, bipolar disorder, possible Parkinsons disease and h/o rection of GIST tumor, glaucoma, HTN    PT Comments  The patient is motivated to ambulate, required 1 person, tolerated x 150'. Patient  eager to ambulate each day. Will have MS begin . Patient will benefit from continued inpatient follow up therapy, <3 hours/day     If plan is discharge home, recommend the following: A little help with walking and/or transfers;A little help with bathing/dressing/bathroom;Assistance with cooking/housework;Assist for transportation   Can travel by private vehicle        Equipment Recommendations  None recommended by PT    Recommendations for Other Services       Precautions / Restrictions Precautions Precautions: Fall Recall of Precautions/Restrictions: Impaired Precaution/Restrictions Comments: NG suction Restrictions Other Position/Activity Restrictions: abdominal surgery     Mobility  Bed Mobility   Bed Mobility: Sidelying to Sit Rolling: Min assist, Used rails Sidelying to sit: Min assist, Used rails       General bed mobility comments: She was instructed on implementing the log roll technique for performing supine to sit    Transfers Overall transfer level: Needs assistance Equipment used: Rolling walker (2 wheels) Transfers: Sit to/from Stand Sit to Stand: Min assist, From elevated surface           General transfer comment: stands with  steady assist    Ambulation/Gait Ambulation/Gait assistance: Min Chemical engineer (Feet): 150 Feet Assistive device: Rolling walker (2 wheels) Gait Pattern/deviations: Step-to pattern, Decreased stride length,  Shuffle, Trunk flexed, Drifts right/left Gait velocity: decreased     General Gait Details: patient stteady with RW and motivated to push on Public house manager     Tilt Bed    Modified Rankin (Stroke Patients Only)       Balance Overall balance assessment: Needs assistance Sitting-balance support: Feet supported Sitting balance-Leahy Scale: Fair     Standing balance support: Reliant on assistive device for balance, During functional activity, Bilateral upper extremity supported Standing balance-Leahy Scale: Poor                              Communication Communication Communication: No apparent difficulties  Cognition Arousal: Alert Behavior During Therapy: WFL for tasks assessed/performed   PT - Cognitive impairments: History of cognitive impairments                       PT - Cognition Comments: pt pleasant, follows commands, self motivated to improve functional status Following commands: Intact      Cueing Cueing Techniques: Verbal cues  Exercises      General Comments        Pertinent Vitals/Pain Pain Assessment Faces Pain Scale: Hurts even more Pain Location: neck Pain Intervention(s): Repositioned    Home Living                          Prior Function            PT Goals (current goals can now be found in the care plan section)  Progress towards PT goals: Progressing toward goals    Frequency    Min 2X/week      PT Plan      Co-evaluation              AM-PAC PT 6 Clicks Mobility   Outcome Measure  Help needed turning from your back to your side while in a flat bed without using bedrails?: A Little Help needed moving from lying on your back to sitting on the side of a flat bed without using bedrails?: A Little Help needed moving to and from a bed to a chair (including a wheelchair)?: A Little Help needed standing up from a chair using your arms (e.g.,  wheelchair or bedside chair)?: A Little Help needed to walk in hospital room?: A Little Help needed climbing 3-5 steps with a railing? : A Lot 6 Click Score: 17    End of Session Equipment Utilized During Treatment: Gait belt Activity Tolerance: Patient tolerated treatment well Patient left: in chair;with call bell/phone within reach;with chair alarm set Nurse Communication: Mobility status PT Visit Diagnosis: Muscle weakness (generalized) (M62.81);Other abnormalities of gait and mobility (R26.89);Unsteadiness on feet (R26.81);Other symptoms and signs involving the nervous system (R29.898)     Time: 8353-8282 PT Time Calculation (min) (ACUTE ONLY): 31 min  Charges:    $Gait Training: 23-37 mins PT General Charges $$ ACUTE PT VISIT: 1 Visit                     Darice Potters PT Acute Rehabilitation Services Office [phone]    Potters Darice Norris 10/15/2023, 5:28 PM

## 2023-10-15 NOTE — Progress Notes (Signed)
 Progress Note  8 Days Post-Op  Subjective: Pt reports maybe passing some flatus overnight but unsure. Foley placed yesterday for acute retention.   Objective: Vital signs in last 24 hours: Temp:  [96.5 F (35.8 C)-97.9 F (36.6 C)] 97.9 F (36.6 C) (10/01 0400) Pulse Rate:  [52-70] 55 (10/01 0600) Resp:  [15-36] 18 (10/01 0600) BP: (82-135)/(38-74) 99/40 (10/01 0600) SpO2:  [99 %-100 %] 100 % (10/01 0600) Weight:  [50.2 kg] 50.2 kg (10/01 0413) Last BM Date : 09/29/23  Intake/Output from previous day: 09/30 0701 - 10/01 0700 In: 1613.2 [I.V.:1073.2; NG/GT:140; IV Piggyback:400] Out: 1820 [Urine:1570; Emesis/NG output:250] Intake/Output this shift: No intake/output data recorded.  PE: General: pleasant, WD, elderly female, NAD Heart: regular, rate, and rhythm. Lungs: Respiratory effort nonlabored Abd: soft, appropriately ttp, mild distention, incision C/D/I with staples present, NGT with thin bilious appearing drainage     Lab Results:  Recent Labs    10/14/23 0451 10/15/23 0402  WBC 15.4* 10.4  HGB 9.0* 8.1*  HCT 28.8* 26.7*  PLT 231 256   BMET Recent Labs    10/14/23 0451 10/15/23 0402  NA 141 142  K 4.9 4.0  CL 109 113*  CO2 22 19*  GLUCOSE 110* 109*  BUN 51* 52*  CREATININE 0.77 0.71  CALCIUM 9.9 9.6   PT/INR No results for input(s): LABPROT, INR in the last 72 hours. CMP     Component Value Date/Time   NA 142 10/15/2023 0402   NA 139 01/26/2020 0000   NA 141 04/24/2012 0929   K 4.0 10/15/2023 0402   K 4.4 04/24/2012 0929   CL 113 (H) 10/15/2023 0402   CL 109 (H) 04/24/2012 0929   CO2 19 (L) 10/15/2023 0402   CO2 23 04/24/2012 0929   GLUCOSE 109 (H) 10/15/2023 0402   GLUCOSE 85 04/24/2012 0929   BUN 52 (H) 10/15/2023 0402   BUN 19 01/26/2020 0000   BUN 20.6 04/24/2012 0929   CREATININE 0.71 10/15/2023 0402   CREATININE 0.79 04/18/2023 1524   CREATININE 0.9 04/24/2012 0929   CALCIUM 9.6 10/15/2023 0402   CALCIUM 10.5 (H)  01/12/2020 2123   CALCIUM 10.0 04/24/2012 0929   PROT 5.8 (L) 10/13/2023 0420   PROT 6.7 04/24/2012 0929   ALBUMIN  3.3 (L) 10/13/2023 0420   ALBUMIN  3.5 04/24/2012 0929   AST 14 (L) 10/13/2023 0420   AST 20 04/24/2012 0929   ALT 11 10/13/2023 0420   ALT 12 04/24/2012 0929   ALKPHOS 144 (H) 10/13/2023 0420   ALKPHOS 75 04/24/2012 0929   BILITOT 0.2 10/13/2023 0420   BILITOT 0.36 04/24/2012 0929   GFRNONAA >60 10/15/2023 0402   GFRNONAA 58 (L) 05/11/2020 0700   GFRAA 68 05/11/2020 0700   Lipase     Component Value Date/Time   LIPASE 22 09/18/2013 1810       Studies/Results: CT ABDOMEN PELVIS W CONTRAST Result Date: 10/13/2023 EXAM: CT ABDOMEN AND PELVIS WITH CONTRAST 10/13/2023 12:06:33 PM TECHNIQUE: CT of the abdomen and pelvis was performed with the administration of 100 mL of iohexol  (OMNIPAQUE ) 300 MG/ML solution. Multiplanar reformatted images are provided for review. Automated exposure control, iterative reconstruction, and/or weight-based adjustment of the mA/kV was utilized to reduce the radiation dose to as low as reasonably achievable. COMPARISON: 10/05/2023 CLINICAL HISTORY: Abdominal pain, post-op. Evaluate progress of high-grade small bowel obstruction with transition point in the pelvis to the right of the midline and mild mesenteric edema. FINDINGS: LOWER CHEST: Slight increase in small/moderate  effusions right greater than left. Dependent atelectasis posteriorly in the lung bases. Moderate hiatal hernia is partially visualized. LIVER: Stable probable small hepatic cysts in right hepatic lobe. No new lesion. GALLBLADDER AND BILE DUCTS: Gallbladder is physiologically distended. No biliary ductal dilatation. SPLEEN: No acute abnormality. PANCREAS: No acute abnormality. ADRENAL GLANDS: No acute abnormality. KIDNEYS, URETERS AND BLADDER: Multiple cortical lesions in kidneys, some of which can be characterized as simple cysts, largest 12 mm mid left kidney. Per consensus, no  follow-up is needed for simple Bosniak type 1 and 2 renal cysts, unless the patient has a malignancy history or risk factors. No stones in the kidneys or ureters. No hydronephrosis. No perinephric or periureteral stranding. The urinary bladder is distended. GI AND BOWEL: Gastric tube extends into the stomach, which is partially distended by gas and contrast material. Multiple dilated proximal and mid small bowel loops with transition point in the right lower quadrant. Decompressed loops of distal small bowel. There is physiologic distention of the right colon. The descending and sigmoid segments of the colon are decompressed with some mild regional inflammatory/edematous changes, new since previous, and regional scattered peripheral enhancing small fluid collections. PERITONEUM AND RETROPERITONEUM: No ascites. No free air. Scattered peripheral enhancing small fluid collections are noted, particularly in the region of the descending and sigmoid colon. VASCULATURE: Aorta is normal in caliber. Mild scattered iliac calcified plaque without aneurysm. LYMPH NODES: No lymphadenopathy. REPRODUCTIVE ORGANS: No acute abnormality. BONES AND SOFT TISSUES: Midline skin staples below the umbilicus. Surgical clips in the right pelvis as before. Degenerative disc disease L5-S1. No acute osseous abnormality. No focal soft tissue abnormality. IMPRESSION: 1. Persistent small bowel obstruction with transition point in the right lower quadrant. 2. descending and sigmoid colon decompressed with new mild regional inflammatory/edematous changes and scattered peripheral enhancing small fluid collections since prior, suggesting colitis. 3. Small to moderate bilateral pleural effusions, right greater than left, with dependent basilar atelectasis. Electronically signed by: Katheleen Faes MD 10/13/2023 04:05 PM EDT RP Workstation: [id number]    Anti-infectives: Anti-infectives (From admission, onward)    Start     Dose/Rate Route Frequency  Ordered Stop   10/14/23 0930  ceFEPIme (MAXIPIME) 2 g in sodium chloride  0.9 % 100 mL IVPB        2 g 200 mL/hr over 30 Minutes Intravenous Every 12 hours 10/14/23 0820     10/14/23 0900  metroNIDAZOLE (FLAGYL) IVPB 500 mg        500 mg 100 mL/hr over 60 Minutes Intravenous 2 times daily 10/14/23 0811     10/07/23 0915  cefoTEtan  (CEFOTAN ) 2 g in sodium chloride  0.9 % 100 mL IVPB        2 g 200 mL/hr over 30 Minutes Intravenous On call to O.R. 10/07/23 0820 10/07/23 1938        Assessment/Plan SBO POD8 s/p dx lap converted to ex lap with LOA and repair of enterotomy, Dr. Dasie 9/23 - continue to mobilize as tolerated - WBC normalized this AM  - pulm toilet, IS - CT AP 9/29 reviewed independently and with IR - pelvic collection does appear to be developing and seems to communicate with other small collections but is too early to drain - Continue abx and rescan either tomorrow or Friday  - continue TPN and NGT to LIWS today  Acute urinary retention - foley placed yesterday, UA suggestive of possible UTI, UCx pending - likely secondary to pelvic abscess   FEN - NGT/NPO/TPN VTE - heparin  gtt  ID -  cefotetan  9/23 pre-op; cefepime/flagyl 9/30>> Foley - placed 9/30, UCx pending      - Per TRH - Dementia  GERD Tremors  HTN  Anxiety  A fib RVR - on amio gtt, heparin  gtt  Anemia    LOS: 14 days    Burnard JONELLE Louder, [facility] 10/15/2023, 8:33 AM Please see Amion for pager number during day hours 7:00am-4:30pm

## 2023-10-16 ENCOUNTER — Inpatient Hospital Stay (HOSPITAL_COMMUNITY)

## 2023-10-16 DIAGNOSIS — H9193 Unspecified hearing loss, bilateral: Secondary | ICD-10-CM | POA: Diagnosis not present

## 2023-10-16 DIAGNOSIS — K56609 Unspecified intestinal obstruction, unspecified as to partial versus complete obstruction: Secondary | ICD-10-CM | POA: Diagnosis not present

## 2023-10-16 DIAGNOSIS — D62 Acute posthemorrhagic anemia: Secondary | ICD-10-CM | POA: Diagnosis not present

## 2023-10-16 DIAGNOSIS — F0390 Unspecified dementia without behavioral disturbance: Secondary | ICD-10-CM | POA: Diagnosis not present

## 2023-10-16 LAB — CBC WITH DIFFERENTIAL/PLATELET
Abs Immature Granulocytes: 0.22 K/uL — ABNORMAL HIGH (ref 0.00–0.07)
Basophils Absolute: 0 K/uL (ref 0.0–0.1)
Basophils Relative: 0 %
Eosinophils Absolute: 0.1 K/uL (ref 0.0–0.5)
Eosinophils Relative: 1 %
HCT: 27.3 % — ABNORMAL LOW (ref 36.0–46.0)
Hemoglobin: 8.1 g/dL — ABNORMAL LOW (ref 12.0–15.0)
Immature Granulocytes: 2 %
Lymphocytes Relative: 13 %
Lymphs Abs: 1.4 K/uL (ref 0.7–4.0)
MCH: 30.5 pg (ref 26.0–34.0)
MCHC: 29.7 g/dL — ABNORMAL LOW (ref 30.0–36.0)
MCV: 102.6 fL — ABNORMAL HIGH (ref 80.0–100.0)
Monocytes Absolute: 0.7 K/uL (ref 0.1–1.0)
Monocytes Relative: 6 %
Neutro Abs: 8.2 K/uL — ABNORMAL HIGH (ref 1.7–7.7)
Neutrophils Relative %: 78 %
Platelets: 313 K/uL (ref 150–400)
RBC: 2.66 MIL/uL — ABNORMAL LOW (ref 3.87–5.11)
RDW: 15.5 % (ref 11.5–15.5)
Smear Review: NORMAL
WBC: 10.6 K/uL — ABNORMAL HIGH (ref 4.0–10.5)
nRBC: 0 % (ref 0.0–0.2)

## 2023-10-16 LAB — MAGNESIUM: Magnesium: 2.2 mg/dL (ref 1.7–2.4)

## 2023-10-16 LAB — PHOSPHORUS: Phosphorus: 3.2 mg/dL (ref 2.5–4.6)

## 2023-10-16 LAB — COMPREHENSIVE METABOLIC PANEL WITH GFR
ALT: 18 U/L (ref 0–44)
AST: 17 U/L (ref 15–41)
Albumin: 3 g/dL — ABNORMAL LOW (ref 3.5–5.0)
Alkaline Phosphatase: 190 U/L — ABNORMAL HIGH (ref 38–126)
Anion gap: 10 (ref 5–15)
BUN: 51 mg/dL — ABNORMAL HIGH (ref 8–23)
CO2: 16 mmol/L — ABNORMAL LOW (ref 22–32)
Calcium: 10 mg/dL (ref 8.9–10.3)
Chloride: 116 mmol/L — ABNORMAL HIGH (ref 98–111)
Creatinine, Ser: 0.74 mg/dL (ref 0.44–1.00)
GFR, Estimated: >60 mL/min (ref >60)
Glucose, Bld: 101 mg/dL — ABNORMAL HIGH (ref 70–99)
Potassium: 4.1 mmol/L (ref 3.5–5.1)
Sodium: 143 mmol/L (ref 135–145)
Total Bilirubin: 0.3 mg/dL (ref 0.0–1.2)
Total Protein: 5.8 g/dL — ABNORMAL LOW (ref 6.5–8.1)

## 2023-10-16 LAB — GLUCOSE, CAPILLARY: Glucose-Capillary: 116 mg/dL — ABNORMAL HIGH (ref 70–99)

## 2023-10-16 LAB — HEPARIN LEVEL (UNFRACTIONATED)
Heparin Unfractionated: 0.82 [IU]/mL — ABNORMAL HIGH (ref 0.30–0.70)
Heparin Unfractionated: 0.69 [IU]/mL (ref 0.30–0.70)

## 2023-10-16 MED ORDER — IOHEXOL 300 MG/ML  SOLN
80.0000 mL | Freq: Once | INTRAMUSCULAR | Status: AC | PRN
  Administered 2023-10-16: 80 mL via INTRAVENOUS

## 2023-10-16 MED ORDER — TRAVASOL 10 % IV SOLN
INTRAVENOUS | Status: AC
  Filled 2023-10-16: qty 889.2

## 2023-10-16 NOTE — Progress Notes (Signed)
 PHARMACY - ANTICOAGULATION CONSULT NOTE  Pharmacy Consult for heparin   Indication: atrial fibrillation  Allergies  Allergen Reactions   Lisinopril Cough   Penicillins Itching and Other (See Comments)    50 years ago   Adhesive [Tape] Itching   Atorvastatin Itching   Dilaudid  [Hydromorphone  Hcl] Itching    Patient Measurements: Height: 5' 2 (157.5 cm) Weight: 53.4 kg (117 lb 11.6 oz) IBW/kg (Calculated) : 50.1 HEPARIN  DW (KG): 50.2  Vital Signs: Temp: 97.7 F (36.5 C) (10/02 0454) Temp Source: Oral (10/02 0454) BP: 134/70 (10/02 0454) Pulse Rate: 69 (10/02 0454)  Labs: Recent Labs    10/14/23 0451 10/14/23 1615 10/15/23 0402 10/16/23 0500  HGB 9.0*  --  8.1* 8.1*  HCT 28.8*  --  26.7* 27.3*  PLT 231  --  256 313  HEPARINUNFRC  --  0.44 0.53 0.82*  CREATININE 0.77  --  0.71  --     Estimated Creatinine Clearance: 42.9 mL/min (by C-G formula based on SCr of 0.71 mg/dL).   Medications:  No prior to admission anticoagulation meds listed  Assessment: 83 yo F admitted with a high grade pSBO.  Pharmacy consulted to dose Heparin  for new onset Afib with RVR. No prior to admission anticoagulation.    Heparin  held 9/29 given bloody appearing NG output upon CCS evaluation (possible NG suction injury). Repeat CBC later in the afternoon showed continued down-trend in hgb (8.7) but had improved 9/30 AM (9.0).   Today, 10/16/23 Heparin  level 0.83--supra-therapeutic on heparin  1000 units/hr CBC still in process/not yet resulted  No bleeding or infusion issues, per RN  Goal of Therapy:  Heparin  level 0.3-0.7 units/ml Monitor platelets by anticoagulation protocol: Yes   Plan:  Decrease heparin  to 900 units/hr and check 8 hour heparin  level Daily heparin  level & CBC while on heparin  Continue to monitor for s/sx of bleeding  F/U long-term anticoagulation plans pending surgery clearance   Rosaline IVAR Edison, Pharm.D Use secure chat for questions 10/16/2023 5:57 AM

## 2023-10-16 NOTE — Progress Notes (Signed)
 PHARMACY - TOTAL PARENTERAL NUTRITION CONSULT NOTE   Indication: Small bowel obstruction and likely post-op ileus  Patient Measurements: Height: 5' 2 (157.5 cm) Weight: 53.4 kg (117 lb 11.6 oz) IBW/kg (Calculated) : 50.1 TPN AdjBW (KG): 50.2 Body mass index is 21.53 kg/m.  Assessment:  83 YO female presenting 9/17 with abdominal pain and distension. Initial CT A/P showed high-grade small bowel obstruction with transition point in the pelvis to the right of the midline and mild mesenteric edema; repeat CT continued to show ongoing dilated small bowel loops. Patient taken to OR 9/23 for diagnostic laparoscopy with LOA and repair of prior small bowel enterotomy. NGT in place. Pharmacy consulted for TPN management in the setting of SBO.   Glucose / Insulin : No Hx DM - CBGs well controlled (goal 100-150) on goal rate TPN - 2 units SSI given in last 24hrs Electrolytes: Cl 116 high, Bicarb 16 low (acetate maxed) , Mg/Phos WNL, Ca 10 (none in TPN) Renal: BUN 51 elevated; SCr stable WNL <1 Hepatic: : albumin  3 down, alk phos 144>190 up, otherwise LFTs/Tbili WNL - TG 199 elevated (9/29 labs) I/O:  - Ng output: -100 mL/24 hrs - mIVF: none currently - LBM 9/30  - UOP: -1000 mL/24 hrs GI Imaging: - 9/17 a/p: High-grade small bowel obstruction with transition point in the pelvis to the right of midline - 9/19 DG abd: SBO pattern with no improvement since the prior CT - 9/21 CT chest/a/p: Small bowel loops remain dilated in the abdomen and pelvis. There is some diluted contrast in the dilated small bowel loops, but no discernible contrast in the colonic lumen. The degree of small-bowel dilatation appears stable to mildly progressive in the interval - 9/23 AXR: persistent SBO with mild improvement - 9/24 AXR: some decompression of SB in upper abdomen noted 9/29 CT a/p: persistent SBO with transition point in RLQ, signs of colitis and basilar atelectasis.  GI Surgeries / Procedures:  - 9/23:  Exploratory laparotomy with lysis of adhesions, primary repair of small bowel enterotomy  Central access: PICC obtained 9/23 TPN start date: 9/24  Nutritional Goals: Goal TPN rate is 65 mL/hr (provides 89 g of protein and 1619 kcals per day)  RD Assessment: Estimated Needs Total Energy Estimated Needs: 1500-1700 kcals Total Protein Estimated Needs: 75-90 grams Total Fluid Estimated Needs: >/= 1.5L  Current Nutrition:  NPO and TPN  Plan:  At 1800: Continue TPN at goal rate of 65 mL/hr Electrolytes in TPN:  Na 20 mEq/L K 30 mEq/L Ca 0 mEq/L Mg 0 mEq/L Phos 20 mmol/L Cl:Ac max Ac - may need to consider starting bicarb gtt if bicarb continues to down-trend despite maximizing acetate in TPN. Add standard MVI and trace elements to TPN Chromium remains on hold d/t nationwide shortage D/c SSI and CBGs Further mIVF per MD (none currently) Monitor TPN labs on Mon/Thurs, and PRN   Jessika Rothery Karoline Marina, PharmD, BCPS Clinical Staff Pharmacist 10/2/20257:44 AM

## 2023-10-16 NOTE — Progress Notes (Signed)
 Progress Note  9 Days Post-Op  Subjective: Patient denies abdominal pain. Does not think she has had a bowel movement (non reported) or flatulence. Denies nausea and vomiting.   ROS  All negative with the exception of above.  Objective: Vital signs in last 24 hours: Temp:  [97.6 F (36.4 C)-98.4 F (36.9 C)] 97.7 F (36.5 C) (10/02 0454) Pulse Rate:  [59-69] 69 (10/02 0454) Resp:  [17-18] 17 (10/02 0454) BP: (115-134)/(55-70) 134/70 (10/02 0454) SpO2:  [98 %-100 %] 99 % (10/02 0454) Weight:  [53.4 kg] 53.4 kg (10/02 0455) Last BM Date : 10/14/23  Intake/Output from previous day: 10/01 0701 - 10/02 0700 In: 2592.9 [I.V.:2117.5; NG/GT:90; IV Piggyback:385.3] Out: 1100 [Urine:1000; Emesis/NG output:100] Intake/Output this shift: Total I/O In: 30 [NG/GT:30] Out: 450 [Urine:450]  PE: General: Pleasant female who is laying in bed in NAD. HEENT: Head is normocephalic, atraumatic.  Sclera are noninjected. Conjunctiva anicteric. NGT present with thin bilious drainage noted in cannister. Heart: HR normal. Lungs: Respiratory effort nonlabored. Abd: Soft, mild distention. Appropriately tender to palpation along midline incision. Midline incision with staples that is C/D/I. Skin: Warm and dry. GU: Foley catheter in place    Lab Results:  Recent Labs    10/15/23 0402 10/16/23 0500  WBC 10.4 10.6*  HGB 8.1* 8.1*  HCT 26.7* 27.3*  PLT 256 313   BMET Recent Labs    10/15/23 0402 10/16/23 0500  NA 142 143  K 4.0 4.1  CL 113* 116*  CO2 19* 16*  GLUCOSE 109* 101*  BUN 52* 51*  CREATININE 0.71 0.74  CALCIUM 9.6 10.0   PT/INR No results for input(s): LABPROT, INR in the last 72 hours. CMP     Component Value Date/Time   NA 143 10/16/2023 0500   NA 139 01/26/2020 0000   NA 141 04/24/2012 0929   K 4.1 10/16/2023 0500   K 4.4 04/24/2012 0929   CL 116 (H) 10/16/2023 0500   CL 109 (H) 04/24/2012 0929   CO2 16 (L) 10/16/2023 0500   CO2 23 04/24/2012 0929    GLUCOSE 101 (H) 10/16/2023 0500   GLUCOSE 85 04/24/2012 0929   BUN 51 (H) 10/16/2023 0500   BUN 19 01/26/2020 0000   BUN 20.6 04/24/2012 0929   CREATININE 0.74 10/16/2023 0500   CREATININE 0.79 04/18/2023 1524   CREATININE 0.9 04/24/2012 0929   CALCIUM 10.0 10/16/2023 0500   CALCIUM 10.5 (H) 01/12/2020 2123   CALCIUM 10.0 04/24/2012 0929   PROT 5.8 (L) 10/16/2023 0500   PROT 6.7 04/24/2012 0929   ALBUMIN  3.0 (L) 10/16/2023 0500   ALBUMIN  3.5 04/24/2012 0929   AST 17 10/16/2023 0500   AST 20 04/24/2012 0929   ALT 18 10/16/2023 0500   ALT 12 04/24/2012 0929   ALKPHOS 190 (H) 10/16/2023 0500   ALKPHOS 75 04/24/2012 0929   BILITOT 0.3 10/16/2023 0500   BILITOT 0.36 04/24/2012 0929   GFRNONAA >60 10/16/2023 0500   GFRNONAA 58 (L) 05/11/2020 0700   GFRAA 68 05/11/2020 0700   Lipase     Component Value Date/Time   LIPASE 22 09/18/2013 1810       Studies/Results: No results found.  Anti-infectives: Anti-infectives (From admission, onward)    Start     Dose/Rate Route Frequency Ordered Stop   10/14/23 0930  ceFEPIme (MAXIPIME) 2 g in sodium chloride  0.9 % 100 mL IVPB        2 g 200 mL/hr over 30 Minutes Intravenous Every 12 hours  10/14/23 0820     10/14/23 0900  metroNIDAZOLE (FLAGYL) IVPB 500 mg        500 mg 100 mL/hr over 60 Minutes Intravenous 2 times daily 10/14/23 0811     10/07/23 0915  cefoTEtan  (CEFOTAN ) 2 g in sodium chloride  0.9 % 100 mL IVPB        2 g 200 mL/hr over 30 Minutes Intravenous On call to O.R. 10/07/23 0820 10/07/23 1938        Assessment/Plan SBO POD9 s/p dx lap converted to ex lap with LOA and repair of enterotomy, Dr. Dasie 9/23 - CT AP 9/29 reviewed independently and with IR - pelvic collection does appear to be developing and seems to communicate with other small collections but is too early to drain - Vitals stable. Afebrile. - WBC 10.6 from 10.4; HGB 8.1 - Continue to mobilize as tolerated. - Pulm toilet, IS - Continue abx. CT  imaging planned for today. - Continue TPN and NGT to LIWS today  Acute urinary retention - Foley placed 9/30, UA suggestive of possible UTI, UCx still pending   FEN - NGT/NPO/TPN VTE - heparin  gtt  ID - cefotetan  9/23 pre-op; cefepime/flagyl 9/30>> Foley - placed 9/30, UCx pending    - Per TRH - Dementia  GERD Tremors  HTN  Anxiety  A fib RVR - on amio gtt, heparin  gtt  Anemia   LOS: 15 days   I reviewed specialist notes, nursing notes, last 24 h vitals and pain scores, last 48 h intake and output, last 24 h labs and trends, and last 24 h imaging results.   Marjorie Carlyon Favre, [facility] 10/16/2023, 11:31 AM Please see Amion for pager number during day hours 7:00am-4:30pm

## 2023-10-16 NOTE — Progress Notes (Signed)
 PHARMACY - ANTICOAGULATION CONSULT NOTE  Pharmacy Consult for heparin   Indication: atrial fibrillation  Allergies  Allergen Reactions   Lisinopril Cough   Penicillins Itching and Other (See Comments)    50 years ago   Adhesive [Tape] Itching   Atorvastatin Itching   Dilaudid  [Hydromorphone  Hcl] Itching    Patient Measurements: Height: 5' 2 (157.5 cm) Weight: 53.4 kg (117 lb 11.6 oz) IBW/kg (Calculated) : 50.1 HEPARIN  DW (KG): 50.2  Vital Signs: Temp: 97.5 F (36.4 C) (10/02 1345) Temp Source: Oral (10/02 1345) BP: 122/60 (10/02 1345) Pulse Rate: 65 (10/02 1345)  Labs: Recent Labs    10/14/23 0451 10/14/23 1615 10/15/23 0402 10/16/23 0500 10/16/23 1508  HGB 9.0*  --  8.1* 8.1*  --   HCT 28.8*  --  26.7* 27.3*  --   PLT 231  --  256 313  --   HEPARINUNFRC  --    < > 0.53 0.82* 0.69  CREATININE 0.77  --  0.71 0.74  --    < > = values in this interval not displayed.    Estimated Creatinine Clearance: 42.9 mL/min (by C-G formula based on SCr of 0.74 mg/dL).   Medications:  No prior to admission anticoagulation meds listed  Assessment: 83 yo F admitted with a high grade pSBO.  Pharmacy consulted to dose Heparin  for new onset Afib with RVR. No prior to admission anticoagulation.    Heparin  held 9/29 given bloody appearing NG output upon CCS evaluation (possible NG suction injury). Repeat CBC later in the afternoon showed continued down-trend in hgb (8.7) but had improved 9/30 AM (9.0).   Today, 10/16/23 Heparin  level 0.69 -- therapeutic on heparin  900 units/hr Hgb low but stable, plt WNL No bleeding or infusion issues, per RN  Goal of Therapy:  Heparin  level 0.3-0.7 units/ml Monitor platelets by anticoagulation protocol: Yes   Plan:  Continue heparin  at  900 units/hr  Daily heparin  level & CBC while on heparin  Continue to monitor for s/sx of bleeding  F/U long-term anticoagulation plans pending surgery clearance     Dolphus Roller, PharmD,  BCPS 10/16/2023 5:03 PM

## 2023-10-16 NOTE — Progress Notes (Signed)
 Occupational Therapy Treatment Patient Details Name: Tracey [name] MRN: [id number] DOB: [birth year] Today's Date: 10/16/2023   History of present illness Patient is an 83 yr old female admitted with SBO. On 10/07/2023 pt s/p diagnostic laparoscopy with lysis of adhesions and repair of prior small bowel enterotomy. 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 She required min assist for supine to sit, implementing the log roll technique. While seated EOB, she performed hair brushing with SBA. She then performed two total stands using a RW. She was able to demo static standing for ~30 seconds after first stand, then implemented standing marches and lateral stepping to head of the bed after 2nd stand. Mod assist was needed for sit to supine. Continue OT plan of care. Patient will benefit from continued inpatient follow up therapy, <3 hours/day.         If plan is discharge home, recommend the following:  A lot of help with bathing/dressing/bathroom;Assistance with cooking/housework;A little help with walking and/or transfers   Equipment Recommendations  Other (comment) (defer to next level of care)    Recommendations for Other Services      Precautions / Restrictions Precautions Precautions: Fall Precaution/Restrictions Comments: NG suction Restrictions Weight Bearing Restrictions Per Provider Order: No Other Position/Activity Restrictions: abdominal surgery       Mobility Bed Mobility Overal bed mobility: Needs Assistance       Supine to sit: Min assist, Used rails, HOB elevated Sit to supine: Mod assist (required assist for BLE back onto bed)   General bed mobility comments: She was instructed on implementing the log roll technique for performing supine to sit    Transfers Overall transfer  level: Needs assistance Equipment used: Rolling walker (2 wheels) Transfers: Sit to/from Stand Sit to Stand: Min assist           General transfer comment: Pt performed two total stands. Able to demo static standing for ~30 seconds after first stand, then implemented standing marches and lateral stepping to head of the bed after 2nd stand     Balance     Sitting balance-Leahy Scale: Fair         Standing balance comment: CGA to min assist with RW             ADL either performed or assessed with clinical judgement   ADL Overall ADL's : Needs assistance/impaired     Grooming: Set up;Supervision/safety;Sitting Grooming Details (indicate cue type and reason): She performed hair brushing in sitting at the edge of the bed.            Communication Communication Communication: No apparent difficulties   Cognition Arousal: Alert Behavior During Therapy: WFL for tasks assessed/performed               OT - Cognition Comments: able to follow 1 step commands consistently          Following commands: Intact        Cueing   Cueing Techniques: Verbal cues             Pertinent Vitals/ Pain       Pain Assessment Pain Assessment: 0-10 Pain Score: 8  Pain Location: abdomen Pain Intervention(s): Limited activity within patient's tolerance, Monitored during session, Repositioned   Frequency  Min 2X/week        Progress Toward Goals  OT  Goals(current goals can now be found in the care plan section)  Progress towards OT goals: Progressing toward goals  Acute Rehab OT Goals OT Goal Formulation: With patient Time For Goal Achievement: 10/22/23 Potential to Achieve Goals: Good  Plan         AM-PAC OT 6 Clicks Daily Activity     Outcome Measure   Help from another person eating meals?: Total (NPO except ice chips) Help from another person taking care of personal grooming?: A Little Help from another person toileting, which includes using  toliet, bedpan, or urinal?: A Lot Help from another person bathing (including washing, rinsing, drying)?: A Lot Help from another person to put on and taking off regular upper body clothing?: A Little Help from another person to put on and taking off regular lower body clothing?: A Little 6 Click Score: 14    End of Session Equipment Utilized During Treatment: Rolling walker (2 wheels)  OT Visit Diagnosis: Unsteadiness on feet (R26.81);Other abnormalities of gait and mobility (R26.89);Muscle weakness (generalized) (M62.81);Pain Pain - part of body:  (abdomen)   Activity Tolerance Patient tolerated treatment well   Patient Left in bed;with call bell/phone within reach;with bed alarm set;with family/visitor present;with nursing/sitter in room   Nurse Communication Mobility status;Patient requests pain meds        Time: 8484-8466 OT Time Calculation (min): 18 min  Charges: OT General Charges $OT Visit: 1 Visit OT Treatments $Therapeutic Activity: 8-22 mins     Delanna JINNY Lesches, OTR/L 10/16/2023, 5:38 PM

## 2023-10-16 NOTE — Progress Notes (Signed)
 PROGRESS NOTE  Tracey [name] FMW:[id number] DOB: 05-17-1940   PCP: Mast, Man X, NP  Patient is from: Home.  DOA: 10/01/2023 LOS: 15  Chief complaints Chief Complaint  Patient presents with   Constipation     Brief Narrative / Interim history: 83 year old F with PMH of cognitive impairment, possible Parkinson's disease, GIST, bipolar disorder and chronic neck pain presenting with abdominal pain and distention and admitted with SBO.  CT showed high-grade SBO with transition point in the pelvis to the right of the midline and mild mesenteric edema.  She failed conservative management with NG tube decompression.  Repeat CT on 9/21 with ongoing SBO .  Eventually, she underwent diagnostic laparoscopy with LOA and repair of prior small bowel enterotomy on 9/23.  Hospital course complicated by postop ileus requiring NG tube and TPN.  She also had worsening leukocytosis.  Repeat CT abdomen and pelvis on 9/29 showed persistent SBO with transition point in RLQ, new mild regional inflammatory/edematous change as well as scattered peripheral enhancing small fluid collections concerning for colitis.  Patient was started on IV cefepime and Flagyl by general surgery.  Subjective: Seen and examined earlier this afternoon.  Continues to complain pain in her neck and occipital region.  She rates her pain 10/10 but does not appear to be in that mild distress. When I explained what 10/10 pain feels like, she said it is not that bad.  Denies nausea, vomiting, photophobia or phonophobia.  Objective: Vitals:   10/15/23 2118 10/16/23 0454 10/16/23 0455 10/16/23 1345  BP: (!) 119/55 134/70  122/60  Pulse: 61 69  65  Resp: 18 17  17   Temp: 97.6 F (36.4 C) 97.7 F (36.5 C)  (!) 97.5 F (36.4 C)  TempSrc: Oral Oral  Oral  SpO2: 98% 99%  100%  Weight:   53.4 kg   Height:        Examination:  GENERAL: Sitting on bedside chair. HEENT: MMM.  Vision and hearing grossly intact.  NG tube in place. NECK:  No focal tenderness.  No swelling.  Fair range of motion. RESP:  No IWOB.  Fair aeration bilaterally. CVS:  RRR. Heart sounds normal.  ABD/GI/GU: BS+. Abd soft, NTND.  Foley catheter in place. MSK/EXT:  Moves extremities. No apparent deformity. No edema.  SKIN: no apparent skin lesion or wound NEURO: AA.  Oriented x 4 except date.  No apparent focal neuro deficit. PSYCH: Calm. Normal affect.   Consultants:  General Surgery  Procedures: 9/23-diagnostic laparoscopy, LOA and repair of prior small bowel enterotomy   Microbiology summarized: 9/20-MRSA PCR screen nonreactive 9/20-blood cultures negative. 9/25-blood cultures negative. 9/30-urine culture reintubated for better gross  Assessment and plan: Small bowel obstruction: Presents with abdominal pain and distention.  CT confirmed high-grade SBO with transition point in RLQ.  Failed conservative management with NG tube decompression.  Repeat CT on 9/21 with persistent SBO.  Underwent diagnostic laparoscopy, LOA and repair of prior small bowel enterotomy on 9/23.  Postop was complicated by prolonged ileus, possible colitis and intra-abdominal abscesses as noted on repeat CT on 9/29. -General surgery managing -Continue IV cefepime and Flagyl -Continue TPN and NGT to LIWS -Follow repeat CT abdomen and pelvis   Postoperative blood loss anemia? Noted some fluctuation but seems to be at baseline.  Received IV iron  earlier in the course. Recent Labs    10/09/23 1510 10/10/23 0500 10/11/23 0401 10/12/23 9461 10/13/23 0420 10/13/23 1532 10/14/23 0331 10/14/23 0451 10/15/23 0402 10/16/23 0500  HGB 10.5*  10.2* 10.8* 11.1* 9.7* 9.3* 8.7* 9.0* 8.1* 8.1*  - Continue monitoring - Continue IV Protonix  - Monitor H&H.  Paroxysmal A-fib with RVR: Likely in the setting of #1.  Required IV amiodarone  at 1 point. -Cardiology recommended resuming p.o. amiodarone  100 mg daily when able to take p.o. - On IV heparin  for anticoagulation -  Cardiology signed off - Optimize electrolytes  Possible postoperative ileus: -Management as above. -Mobilize patient  Hypotension: Normotensive for most part. - Continue monitoring  Dementia without behavioral disturbance: Oriented x 4 except date. - Reorientation and delirium precaution.  Hypokalemia/hypophosphatemia - Pharmacy replenishing with TPN.   Chronic neck pain/cervicalgia: Chronic.  Rates her pain 10/10 although she does not appear to be in that mild distress.  Not a reliable historian -Continue pain meds, muscle relaxers and Voltaren  gel.  Concern for otitis externa -Ciprodex  otic 4 drops to the left ear twice daily x 5 days.   Acute urinary retention - Continue Foley catheter  UTI?  UA concerning.  Urine culture with Proteus mirabilis. - Already on antibiotics - Follow culture sensitivity.   Severe malnutrition Body mass index is 21.53 kg/m. Nutrition Problem: Severe Malnutrition Etiology: chronic illness Signs/Symptoms: severe fat depletion, severe muscle depletion, percent weight loss (11% in 6 months) Percent weight loss: 11 % (in 6 months) Interventions: Refer to RD note for recommendations   DVT prophylaxis:  Place and maintain sequential compression device Start: 10/13/23 0945 SCDs Start: 10/01/23 2349  Code Status: DNR. Family Communication: None at bedside. Level of care: Progressive Status is: Inpatient Remains inpatient appropriate because: SBO, postop ileus, intra-abdominal infection   Final disposition: SNF   55 minutes with more than 50% spent in reviewing records, counseling patient/family and coordinating care.   Sch Meds:  Scheduled Meds:  acetaminophen   650 mg Oral Once   Chlorhexidine  Gluconate Cloth  6 each Topical QHS   cycloSPORINE   1 drop Both Eyes BID   latanoprost   1 drop Both Eyes QHS   lidocaine   1 patch Transdermal Q24H   pantoprazole  (PROTONIX ) IV  80 mg Intravenous Q12H   sodium chloride  flush  10-40 mL  Intracatheter Q12H   timolol   1 drop Both Eyes Daily   Continuous Infusions:  ceFEPime (MAXIPIME) IV 2 g (10/16/23 0819)   heparin  900 Units/hr (10/16/23 0600)   metronidazole 500 mg (10/16/23 0933)   TPN ADULT (ION) 65 mL/hr at 10/15/23 1749   TPN ADULT (ION)     PRN Meds:.artificial tears, ketorolac , LORazepam , methocarbamol  (ROBAXIN ) injection, morphine  injection, Muscle Rub, ondansetron  **OR** ondansetron  (ZOFRAN ) IV, mouth rinse, sodium chloride  flush  Antimicrobials: Anti-infectives (From admission, onward)    Start     Dose/Rate Route Frequency Ordered Stop   10/14/23 0930  ceFEPIme (MAXIPIME) 2 g in sodium chloride  0.9 % 100 mL IVPB        2 g 200 mL/hr over 30 Minutes Intravenous Every 12 hours 10/14/23 0820     10/14/23 0900  metroNIDAZOLE (FLAGYL) IVPB 500 mg        500 mg 100 mL/hr over 60 Minutes Intravenous 2 times daily 10/14/23 0811     10/07/23 0915  cefoTEtan  (CEFOTAN ) 2 g in sodium chloride  0.9 % 100 mL IVPB        2 g 200 mL/hr over 30 Minutes Intravenous On call to O.R. 10/07/23 0820 10/07/23 1938        I have personally reviewed the following labs and images: CBC: Recent Labs  Lab 10/13/23 0420 10/13/23 1532 10/14/23  9668 10/14/23 0451 10/15/23 0402 10/16/23 0500  WBC 20.0* 19.9* 14.7* 15.4* 10.4 10.6*  NEUTROABS 16.5*  --  12.2* 12.6* 7.9* 8.2*  HGB 9.7* 9.3* 8.7* 9.0* 8.1* 8.1*  HCT 30.8* 29.9* 28.9* 28.8* 26.7* 27.3*  MCV 100.7* 101.0* 104.7* 102.1* 102.3* 102.6*  PLT 214 221 232 231 256 313   BMP &GFR Recent Labs  Lab 10/11/23 0401 10/12/23 0538 10/13/23 0420 10/14/23 0451 10/15/23 0402 10/16/23 0500  NA 145 141 139 141 142 143  K 3.7 4.8 4.6 4.9 4.0 4.1  CL 106 105 107 109 113* 116*  CO2 28 23 23 22  19* 16*  GLUCOSE 148* 133* 126* 110* 109* 101*  BUN 27* 35* 45* 51* 52* 51*  CREATININE 0.86 0.82 0.82 0.77 0.71 0.74  CALCIUM 9.9 10.0 9.9 9.9 9.6 10.0  MG 2.2  --  2.5* 2.5* 2.4 2.2  PHOS 2.3* 3.6 3.3 3.7 3.5 3.2   Estimated  Creatinine Clearance: 42.9 mL/min (by C-G formula based on SCr of 0.74 mg/dL). Liver & Pancreas: Recent Labs  Lab 10/10/23 0500 10/11/23 0401 10/13/23 0420 10/16/23 0500  AST 17 15 14* 17  ALT 10 8 11 18   ALKPHOS 81 114 144* 190*  BILITOT 0.4 0.3 0.2 0.3  PROT 5.6* 5.7* 5.8* 5.8*  ALBUMIN  3.6 3.4* 3.3* 3.0*   No results for input(s): LIPASE, AMYLASE in the last 168 hours. No results for input(s): AMMONIA in the last 168 hours. Diabetic: No results for input(s): HGBA1C in the last 72 hours. Recent Labs  Lab 10/14/23 2352 10/15/23 0757 10/15/23 1631 10/15/23 2344 10/16/23 0732  GLUCAP 129* 111* 107* 121* 116*   Cardiac Enzymes: No results for input(s): CKTOTAL, CKMB, CKMBINDEX, TROPONINI in the last 168 hours. Recent Labs    10/04/23 1310  PROBNP 529.0*   Coagulation Profile: No results for input(s): INR, PROTIME in the last 168 hours. Thyroid  Function Tests: No results for input(s): TSH, T4TOTAL, FREET4, T3FREE, THYROIDAB in the last 72 hours. Lipid Profile: No results for input(s): CHOL, HDL, LDLCALC, TRIG, CHOLHDL, LDLDIRECT in the last 72 hours.  Anemia Panel: No results for input(s): VITAMINB12, FOLATE, FERRITIN, TIBC, IRON , RETICCTPCT in the last 72 hours. Urine analysis:    Component Value Date/Time   COLORURINE YELLOW 10/14/2023 1049   APPEARANCEUR HAZY (A) 10/14/2023 1049   LABSPEC 1.017 10/14/2023 1049   PHURINE 9.0 (H) 10/14/2023 1049   GLUCOSEU NEGATIVE 10/14/2023 1049   HGBUR NEGATIVE 10/14/2023 1049   BILIRUBINUR NEGATIVE 10/14/2023 1049   KETONESUR NEGATIVE 10/14/2023 1049   PROTEINUR 30 (A) 10/14/2023 1049   NITRITE NEGATIVE 10/14/2023 1049   LEUKOCYTESUR SMALL (A) 10/14/2023 1049   Sepsis Labs: Invalid input(s): PROCALCITONIN, LACTICIDVEN  Microbiology: Recent Results (from the past 240 hours)  Urine Culture (for pregnant, neutropenic or urologic patients or patients with an  indwelling urinary catheter)     Status: None   Collection Time: 10/08/23  3:00 PM   Specimen: Urine, Clean Catch  Result Value Ref Range Status   Specimen Description   Final    URINE, CLEAN CATCH Performed at [facility], [address] [place], [place] [address]    Special Requests   Final    NONE Performed at [facility], [address] [place], [place] [address]    Culture   Final    NO GROWTH Performed at [facility], [address], [place], [place] [address]    Report Status 10/09/2023 FINAL  Final  Culture, blood (Routine X 2) w Reflex to ID Panel     Status: None   Collection Time: 10/09/23  8:52 AM   Specimen: BLOOD LEFT ARM  Result Value Ref Range Status   Specimen Description   Final    BLOOD LEFT ARM Performed at [facility], [address], [place], [place] [address]    Special Requests   Final    BOTTLES DRAWN AEROBIC ONLY Blood Culture results may not be optimal due to an inadequate volume of blood received in culture bottles Performed at [facility], [address] [place], [place] [address]    Culture   Final    NO GROWTH 5 DAYS Performed at [facility], [address], [place], [place] [address]    Report Status 10/14/2023 FINAL  Final  Culture, blood (Routine X 2) w Reflex to ID Panel     Status: None   Collection Time: 10/09/23  9:09 AM   Specimen: BLOOD LEFT ARM  Result Value Ref Range Status   Specimen Description   Final    BLOOD LEFT ARM Performed at [facility], [address] [place], [place] [address]    Special Requests   Final    BOTTLES DRAWN AEROBIC ONLY Blood Culture results may not be optimal due to an inadequate volume of blood received in culture bottles Performed at [facility], [address] [place], [place] [address]    Culture   Final    NO GROWTH 5 DAYS Performed at [facility], [address]  [place], [place] [address]    Report Status 10/14/2023 FINAL  Final  Urine Culture (for pregnant, neutropenic or urologic patients or patients with an indwelling urinary catheter)     Status: Abnormal (Preliminary result)   Collection Time: 10/14/23 10:49 AM   Specimen: Urine, Clean Catch  Result Value Ref Range Status   Specimen Description   Final    URINE, CLEAN CATCH Performed at [facility], [address], [place], [place] [address]    Special Requests   Final    NONE Performed at [facility], [address], [place], [place] [address]    Culture (A)  Final    >=100,000 COLONIES/mL PROTEUS MIRABILIS SUSCEPTIBILITIES TO FOLLOW CULTURE REINCUBATED FOR BETTER GROWTH Performed at [facility], [address], [place], [place] [address]    Report Status PENDING  Incomplete    Radiology Studies: No results found.    Chesnie Capell T. Tamella Tuccillo [facility]  If 7PM-7AM, please contact night-coverage [email or web address] 10/16/2023, 2:31 PM

## 2023-10-17 DIAGNOSIS — K56609 Unspecified intestinal obstruction, unspecified as to partial versus complete obstruction: Secondary | ICD-10-CM | POA: Diagnosis not present

## 2023-10-17 DIAGNOSIS — F0390 Unspecified dementia without behavioral disturbance: Secondary | ICD-10-CM | POA: Diagnosis not present

## 2023-10-17 DIAGNOSIS — D62 Acute posthemorrhagic anemia: Secondary | ICD-10-CM | POA: Diagnosis not present

## 2023-10-17 DIAGNOSIS — H9193 Unspecified hearing loss, bilateral: Secondary | ICD-10-CM | POA: Diagnosis not present

## 2023-10-17 LAB — CBC WITH DIFFERENTIAL/PLATELET
Abs Immature Granulocytes: 0.1 K/uL — ABNORMAL HIGH (ref 0.00–0.07)
Basophils Absolute: 0 K/uL (ref 0.0–0.1)
Basophils Relative: 0 %
Eosinophils Absolute: 0.1 K/uL (ref 0.0–0.5)
Eosinophils Relative: 1 %
HCT: 27.6 % — ABNORMAL LOW (ref 36.0–46.0)
Hemoglobin: 8.1 g/dL — ABNORMAL LOW (ref 12.0–15.0)
Immature Granulocytes: 1 %
Lymphocytes Relative: 14 %
Lymphs Abs: 1.3 K/uL (ref 0.7–4.0)
MCH: 30.6 pg (ref 26.0–34.0)
MCHC: 29.3 g/dL — ABNORMAL LOW (ref 30.0–36.0)
MCV: 104.2 fL — ABNORMAL HIGH (ref 80.0–100.0)
Monocytes Absolute: 0.6 K/uL (ref 0.1–1.0)
Monocytes Relative: 6 %
Neutro Abs: 7.5 K/uL (ref 1.7–7.7)
Neutrophils Relative %: 78 %
Platelets: 350 K/uL (ref 150–400)
RBC: 2.65 MIL/uL — ABNORMAL LOW (ref 3.87–5.11)
RDW: 15.6 % — ABNORMAL HIGH (ref 11.5–15.5)
WBC: 9.6 K/uL (ref 4.0–10.5)
nRBC: 0 % (ref 0.0–0.2)

## 2023-10-17 LAB — PROTIME-INR
INR: 1.2 (ref 0.8–1.2)
Prothrombin Time: 15.6 s — ABNORMAL HIGH (ref 11.4–15.2)

## 2023-10-17 LAB — URINE CULTURE: Culture: >=100000 — AB

## 2023-10-17 LAB — HEPARIN LEVEL (UNFRACTIONATED): Heparin Unfractionated: 0.58 [IU]/mL (ref 0.30–0.70)

## 2023-10-17 MED ORDER — AMIODARONE HCL 100 MG PO TABS
100.0000 mg | ORAL_TABLET | Freq: Every day | ORAL | Status: DC
  Administered 2023-10-17 – 2023-10-27 (×11): 100 mg via ORAL
  Filled 2023-10-17 (×11): qty 1

## 2023-10-17 MED ORDER — DEXTROSE 10 % IV SOLN: INTRAVENOUS | Status: AC

## 2023-10-17 MED ORDER — TRAVASOL 10 % IV SOLN
INTRAVENOUS | Status: AC
  Filled 2023-10-17: qty 889.2

## 2023-10-17 NOTE — Progress Notes (Signed)
 Physical Therapy Treatment Patient Details Name: Tracey [name] MRN: [id number] DOB: 09-Aug-1940 Today's Date: 10/17/2023   History of Present Illness Patient is an 83 yo female admitted with SBO. On 10/07/2023 pt s/p diagnostic laparoscopy with LOA and repair of prior small bowel enterotomy. PMH: cognitive impairment, bipolar disorder, possible Parkinsons disease and h/o rection of GIST tumor, glaucoma, HTN    PT Comments   Pt admitted with above diagnosis.  Pt currently with functional limitations due to the deficits listed below (see PT Problem List). Pt in bed when PT arrived. Pt reported not feeling well c/o HA and feeling as if there was a nail in her buttocks. Pt agreeable to PT assist for repositioning in bed, R side lying with mod A and cues with use pillows as props, pt ed provided on use of call bell to communicate needs and encouraged to reposition frequently to maintain skin integrity. Pt verbalized understanding. Pt left in bed and all needs in place. Patient will benefit from continued inpatient follow up therapy, <3 hours/day.  Pt will benefit from acute skilled PT to increase their independence and safety with mobility to allow discharge.      If plan is discharge home, recommend the following: A little help with walking and/or transfers;A little help with bathing/dressing/bathroom;Assistance with cooking/housework;Assist for transportation   Can travel by private vehicle     Yes  Equipment Recommendations  None recommended by PT    Recommendations for Other Services       Precautions / Restrictions Precautions Precautions: Fall Recall of Precautions/Restrictions: Impaired Restrictions Weight Bearing Restrictions Per Provider Order: No Other Position/Activity Restrictions: abdominal surgery     Mobility  Bed Mobility Overal bed mobility: Needs Assistance Bed Mobility: Rolling Rolling: Mod assist, Used rails         General bed mobility comments: pt agreeable to  rolling to the R side for offloading buttocks due to pain, pt ed provided on use of call bell and requesing staff assistance to reposition to limit risk of skin deterioration, pt verbalized understanding and positioned with use of pillows to offload bony prominences    Transfers                   General transfer comment: pt declined secondary to pain    Ambulation/Gait               General Gait Details: pt declined secondary to pain   Stairs             Wheelchair Mobility     Tilt Bed    Modified Rankin (Stroke Patients Only)       Balance Overall balance assessment: Needs assistance Sitting-balance support: Feet supported Sitting balance-Leahy Scale: Fair     Standing balance support: Reliant on assistive device for balance, During functional activity, Bilateral upper extremity supported Standing balance-Leahy Scale: Poor Standing balance comment: CGA to min assist with RW                            Communication Communication Communication: No apparent difficulties Factors Affecting Communication: Other (comment)  Cognition Arousal: Alert Behavior During Therapy: WFL for tasks assessed/performed   PT - Cognitive impairments: History of cognitive impairments                         Following commands: Intact      Cueing Cueing Techniques: Verbal cues  Exercises      General Comments        Pertinent Vitals/Pain Pain Assessment Pain Assessment: Faces Faces Pain Scale: Hurts whole lot Pain Location: buttocks, pt reports feeling like there is a nail in her bottom Pain Descriptors / Indicators: Constant, Discomfort, Grimacing, Headache Pain Intervention(s): Monitored during session, Repositioned    Home Living                          Prior Function            PT Goals (current goals can now be found in the care plan section) Acute Rehab PT Goals Patient Stated Goal: I love physical  therapy PT Goal Formulation: With patient Time For Goal Achievement: 10/20/23 Potential to Achieve Goals: Good Progress towards PT goals: Not progressing toward goals - comment (secondary to pain)    Frequency    Min 2X/week      PT Plan      Co-evaluation              AM-PAC PT 6 Clicks Mobility   Outcome Measure  Help needed turning from your back to your side while in a flat bed without using bedrails?: A Little Help needed moving from lying on your back to sitting on the side of a flat bed without using bedrails?: A Little Help needed moving to and from a bed to a chair (including a wheelchair)?: A Little Help needed standing up from a chair using your arms (e.g., wheelchair or bedside chair)?: A Little Help needed to walk in hospital room?: A Little Help needed climbing 3-5 steps with a railing? : A Lot 6 Click Score: 17    End of Session   Activity Tolerance: Patient limited by pain Patient left: with call bell/phone within reach;in bed;with bed alarm set Nurse Communication: Mobility status PT Visit Diagnosis: Muscle weakness (generalized) (M62.81);Other abnormalities of gait and mobility (R26.89);Unsteadiness on feet (R26.81);Other symptoms and signs involving the nervous system (R29.898)     Time: 8588-8577 PT Time Calculation (min) (ACUTE ONLY): 11 min  Charges:    $Therapeutic Activity: 8-22 mins PT General Charges $$ ACUTE PT VISIT: 1 Visit                     [place], PT Acute Rehab    [place] VEAR Drone 10/17/2023, 3:21 PM

## 2023-10-17 NOTE — Progress Notes (Signed)
 PHARMACY - ANTICOAGULATION CONSULT NOTE  Pharmacy Consult for Heparin   Indication: atrial fibrillation  Allergies  Allergen Reactions   Lisinopril Cough   Penicillins Itching and Other (See Comments)    50 years ago   Adhesive [Tape] Itching   Atorvastatin Itching   Dilaudid  [Hydromorphone  Hcl] Itching    Patient Measurements: Height: 5' 2 (157.5 cm) Weight: 54 kg (119 lb 0.8 oz) IBW/kg (Calculated) : 50.1 HEPARIN  DW (KG): 50.2  Vital Signs: Temp: 98.2 F (36.8 C) (10/03 0354) Temp Source: Oral (10/03 0354) BP: 128/61 (10/03 0354) Pulse Rate: 73 (10/03 0354)  Labs: Recent Labs    10/15/23 0402 10/16/23 0500 10/16/23 1508 10/17/23 0335  HGB 8.1* 8.1*  --  8.1*  HCT 26.7* 27.3*  --  27.6*  PLT 256 313  --  350  LABPROT  --   --   --  15.6*  INR  --   --   --  1.2  HEPARINUNFRC 0.53 0.82* 0.69 0.58  CREATININE 0.71 0.74  --   --     Estimated Creatinine Clearance: 42.9 mL/min (by C-G formula based on SCr of 0.74 mg/dL).   Medical History: 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, possible    Tremors possible parkinsons   Manic disorder, single episode, in full remission 12/01/2009   Sixth nerve palsy     Assessment:  AC/Heme: UFH per Rx for new Afib Hgb down-trending, plts okay. CHADS2VASC: 4.   - Hep level: 0.58, INR 1.2, Hgb 8.1 stable, Plts 350 rising.  Goal of Therapy:  Heparin  level 0.3-0.7  units/ml Monitor platelets by anticoagulation protocol: Yes   Plan:  Con't IV heparin  at 900 units/hr Daily HL and CBC   Tracey [name], PharmD, BCPS Clinical Staff Pharmacist [name] Stillinger 10/17/2023,8:09 AM

## 2023-10-17 NOTE — TOC Progression Note (Signed)
 Transition of Care [facility]) - Progression Note    Patient Details  Name: Tracey [name] MRN: [id number] Date of Birth: [birth year]  [facility] CM/SW Contact  Sonda Manuella Quill, RN Phone Number: 10/17/2023, 9:03 AM  Clinical Narrative:     Pt remains on TPN and Heparin  drip; NG also in place; not ready for d/c.  Expected Discharge Plan: Skilled Nursing Facility Barriers to Discharge: Continued Medical Work up               Expected Discharge Plan and Services   Discharge Planning Services: CM Consult Post Acute Care Choice: Skilled Nursing Facility Living arrangements for the past 2 months: Assisted Living Facility                                       Social Drivers of Health (SDOH) Interventions SDOH Screenings   Food Insecurity: No Food Insecurity (10/02/2023)  Housing: Low Risk  (10/02/2023)  Transportation Needs: No Transportation Needs (10/02/2023)  Utilities: Not At Risk (10/02/2023)  Depression (PHQ2-9): Low Risk  (10/02/2023)  Social Connections: Moderately Integrated (10/02/2023)  Tobacco Use: Low Risk  (10/16/2023)    Readmission Risk Interventions     No data to display

## 2023-10-17 NOTE — Progress Notes (Signed)
 Request received for pelvis abscess drain for inpatient. Latest imaging studies reviewed by Dr Hughes. Case discussed with general surgery. Pt overall doing well clinically. Collection too small for drain placement per Dr Hughes.      Jomar Denz B Lusero Nordlund NP 10/17/2023 9:11 AM

## 2023-10-17 NOTE — Progress Notes (Signed)
 PROGRESS NOTE  Tracey [name] FMW:[id number] DOB: 11-19-1940   PCP: Mast, Man X, NP  Patient is from: Home.  DOA: 10/01/2023 LOS: 16  Chief complaints Chief Complaint  Patient presents with   Constipation     Brief Narrative / Interim history: 83 year old F with PMH of cognitive impairment, possible Parkinson's disease, GIST, bipolar disorder and chronic neck pain presenting with abdominal pain and distention and admitted with SBO.  CT showed high-grade SBO with transition point in the pelvis to the right of the midline and mild mesenteric edema.  She failed conservative management with NG tube decompression.  Repeat CT on 9/21 with ongoing SBO .  Eventually, she underwent diagnostic laparoscopy with LOA and repair of prior small bowel enterotomy on 9/23.  Hospital course complicated by postop ileus requiring NG tube and TPN.  She also had worsening leukocytosis.  Repeat CT abdomen and pelvis on 9/29 showed persistent SBO with transition point in RLQ, new mild regional inflammatory/edematous change as well as scattered peripheral enhancing small fluid collections concerning for colitis.  Patient was started on IV cefepime and Flagyl by general surgery.  Repeat CT abdomen and pelvis on 10/2 with interval resolution of SBO and mild interval decrease in intra-abdominal fluid collection.  IR reengaged and recommended holding off IR drainage since collection is smaller and patient is improving.  Patient pulled out NG tube the night of 10/2.  Had a bowel movement.  No nausea or vomiting.  Subjective: Seen and examined earlier this afternoon.  Reports feeling well this morning.  Denies nausea, vomiting or abdominal pain.  Neck pain and headache resolved as well.  Objective: Vitals:   10/16/23 1345 10/16/23 2007 10/17/23 0354 10/17/23 0400  BP: 122/60 (!) 144/70 128/61   Pulse: 65 66 73   Resp: 17 17 18    Temp: (!) 97.5 F (36.4 C) 98.6 F (37 C) 98.2 F (36.8 C)   TempSrc: Oral Oral Oral    SpO2: 100% 99% 100%   Weight:    54 kg  Height:        Examination:  GENERAL: Sitting on bedside chair. HEENT: MMM.  Vision and hearing grossly intact.  NECK: No focal tenderness.  No swelling.  Fair range of motion. RESP:  No IWOB.  Fair aeration bilaterally. CVS:  RRR. Heart sounds normal.  ABD/GI/GU: BS+. Abd soft, NTND.  Foley catheter in place. MSK/EXT:  Moves extremities. No apparent deformity. No edema.  SKIN: no apparent skin lesion or wound NEURO: AA.  Oriented x 4 except date.  No apparent focal neuro deficit. PSYCH: Calm. Normal affect.   Consultants:  General Surgery  Procedures: 9/23-diagnostic laparoscopy, LOA and repair of prior small bowel enterotomy   Microbiology summarized: 9/20-MRSA PCR screen nonreactive 9/20-blood cultures negative. 9/25-blood cultures negative. 9/30-urine culture with pansensitive Proteus mirabilis except to Macrobid  Assessment and plan: Small bowel obstruction: Presents with abdominal pain and distention.  CT confirmed high-grade SBO with transition point in RLQ.  Failed conservative management with NG tube decompression.  Repeat CT on 9/21 with persistent SBO.  Underwent diagnostic laparoscopy, LOA and repair of prior small bowel enterotomy on 9/23.  Postop was complicated by prolonged ileus, possible colitis and intra-abdominal abscesses as noted on repeat CT on 9/29.  Repeat CT on 10/2 with resolution of SBO and improved intra-abdominal fluid collection.  NG tube came out on 10/2.  No nausea, vomiting or abdominal pain.  Had BM last night. -General surgery following. -Continue IV cefepime and Flagyl -Continue  TPN.  Started CLD. -Mobilize patient   Postoperative blood loss anemia? Noted some fluctuation but seems to be at baseline.  Received IV iron  earlier in the course. Recent Labs    10/10/23 0500 10/11/23 0401 10/12/23 0538 10/13/23 0420 10/13/23 1532 10/14/23 0331 10/14/23 0451 10/15/23 0402 10/16/23 0500  10/17/23 0335  HGB 10.2* 10.8* 11.1* 9.7* 9.3* 8.7* 9.0* 8.1* 8.1* 8.1*  - Continue monitoring - Continue IV Protonix  - Monitor H&H.  Paroxysmal A-fib with RVR: Likely in the setting of #1.  Required IV amiodarone  at 1 point. -Cardiology recommended resuming p.o. amiodarone  100 mg daily when able to take p.o. - On IV heparin  for anticoagulation - Cardiology signed off - Optimize electrolytes  Possible postoperative ileus: Seems to have resolved. -Management as above. -Mobilize patient  Hypotension: Normotensive for most part. - Continue monitoring  Dementia without behavioral disturbance: Oriented x 4 except date. - Reorientation and delirium precaution.  Hypokalemia/hypophosphatemia - Pharmacy replenishing with TPN.   Chronic neck pain/cervicalgia: Chronic.  Rates her pain 10/10 although she does not appear to be in that mild distress.  Not a reliable historian -Continue pain meds, muscle relaxers and Voltaren  gel.  Concern for otitis externa -Ciprodex  otic 4 drops to the left ear twice daily x 5 days.   Acute urinary retention - Continue Foley catheter - Will attempt voiding trial if ambulatory  UTI?  UA concerning.  Urine culture with Proteus mirabilis. - Already on antibiotics   Severe malnutrition Body mass index is 21.77 kg/m. Nutrition Problem: Severe Malnutrition Etiology: chronic illness Signs/Symptoms: severe fat depletion, severe muscle depletion, percent weight loss (11% in 6 months) Percent weight loss: 11 % (in 6 months) Interventions: Refer to RD note for recommendations   DVT prophylaxis:  Place and maintain sequential compression device Start: 10/13/23 0945 SCDs Start: 10/01/23 2349  Code Status: DNR. Family Communication: None at bedside. Level of care: Telemetry Status is: Inpatient Remains inpatient appropriate because: SBO, postop ileus, intra-abdominal infection   Final disposition: SNF   55 minutes with more than 50% spent in  reviewing records, counseling patient/family and coordinating care.   Sch Meds:  Scheduled Meds:  acetaminophen   650 mg Oral Once   Chlorhexidine  Gluconate Cloth  6 each Topical QHS   cycloSPORINE   1 drop Both Eyes BID   latanoprost   1 drop Both Eyes QHS   lidocaine   1 patch Transdermal Q24H   pantoprazole  (PROTONIX ) IV  80 mg Intravenous Q12H   sodium chloride  flush  10-40 mL Intracatheter Q12H   timolol   1 drop Both Eyes Daily   Continuous Infusions:  ceFEPime (MAXIPIME) IV 2 g (10/17/23 0934)   dextrose  65 mL/hr at 10/17/23 0516   heparin  900 Units/hr (10/16/23 1744)   metronidazole 500 mg (10/17/23 1125)   TPN ADULT (ION) 65 mL/hr at 10/16/23 1738   PRN Meds:.artificial tears, ketorolac , LORazepam , methocarbamol  (ROBAXIN ) injection, morphine  injection, Muscle Rub, ondansetron  **OR** ondansetron  (ZOFRAN ) IV, mouth rinse, sodium chloride  flush  Antimicrobials: Anti-infectives (From admission, onward)    Start     Dose/Rate Route Frequency Ordered Stop   10/14/23 0930  ceFEPIme (MAXIPIME) 2 g in sodium chloride  0.9 % 100 mL IVPB        2 g 200 mL/hr over 30 Minutes Intravenous Every 12 hours 10/14/23 0820     10/14/23 0900  metroNIDAZOLE (FLAGYL) IVPB 500 mg        500 mg 100 mL/hr over 60 Minutes Intravenous 2 times daily 10/14/23 0811  10/07/23 0915  cefoTEtan  (CEFOTAN ) 2 g in sodium chloride  0.9 % 100 mL IVPB        2 g 200 mL/hr over 30 Minutes Intravenous On call to O.R. 10/07/23 0820 10/07/23 1938        I have personally reviewed the following labs and images: CBC: Recent Labs  Lab 10/14/23 0331 10/14/23 0451 10/15/23 0402 10/16/23 0500 10/17/23 0335  WBC 14.7* 15.4* 10.4 10.6* 9.6  NEUTROABS 12.2* 12.6* 7.9* 8.2* 7.5  HGB 8.7* 9.0* 8.1* 8.1* 8.1*  HCT 28.9* 28.8* 26.7* 27.3* 27.6*  MCV 104.7* 102.1* 102.3* 102.6* 104.2*  PLT 232 231 256 313 350   BMP &GFR Recent Labs  Lab 10/11/23 0401 10/12/23 0538 10/13/23 0420 10/14/23 0451 10/15/23 0402  10/16/23 0500  NA 145 141 139 141 142 143  K 3.7 4.8 4.6 4.9 4.0 4.1  CL 106 105 107 109 113* 116*  CO2 28 23 23 22  19* 16*  GLUCOSE 148* 133* 126* 110* 109* 101*  BUN 27* 35* 45* 51* 52* 51*  CREATININE 0.86 0.82 0.82 0.77 0.71 0.74  CALCIUM 9.9 10.0 9.9 9.9 9.6 10.0  MG 2.2  --  2.5* 2.5* 2.4 2.2  PHOS 2.3* 3.6 3.3 3.7 3.5 3.2   Estimated Creatinine Clearance: 42.9 mL/min (by C-G formula based on SCr of 0.74 mg/dL). Liver & Pancreas: Recent Labs  Lab 10/11/23 0401 10/13/23 0420 10/16/23 0500  AST 15 14* 17  ALT 8 11 18   ALKPHOS 114 144* 190*  BILITOT 0.3 0.2 0.3  PROT 5.7* 5.8* 5.8*  ALBUMIN  3.4* 3.3* 3.0*   No results for input(s): LIPASE, AMYLASE in the last 168 hours. No results for input(s): AMMONIA in the last 168 hours. Diabetic: No results for input(s): HGBA1C in the last 72 hours. Recent Labs  Lab 10/14/23 2352 10/15/23 0757 10/15/23 1631 10/15/23 2344 10/16/23 0732  GLUCAP 129* 111* 107* 121* 116*   Cardiac Enzymes: No results for input(s): CKTOTAL, CKMB, CKMBINDEX, TROPONINI in the last 168 hours. Recent Labs    10/04/23 1310  PROBNP 529.0*   Coagulation Profile: Recent Labs  Lab 10/17/23 0335  INR 1.2   Thyroid  Function Tests: No results for input(s): TSH, T4TOTAL, FREET4, T3FREE, THYROIDAB in the last 72 hours. Lipid Profile: No results for input(s): CHOL, HDL, LDLCALC, TRIG, CHOLHDL, LDLDIRECT in the last 72 hours.  Anemia Panel: No results for input(s): VITAMINB12, FOLATE, FERRITIN, TIBC, IRON , RETICCTPCT in the last 72 hours. Urine analysis:    Component Value Date/Time   COLORURINE YELLOW 10/14/2023 1049   APPEARANCEUR HAZY (A) 10/14/2023 1049   LABSPEC 1.017 10/14/2023 1049   PHURINE 9.0 (H) 10/14/2023 1049   GLUCOSEU NEGATIVE 10/14/2023 1049   HGBUR NEGATIVE 10/14/2023 1049   BILIRUBINUR NEGATIVE 10/14/2023 1049   KETONESUR NEGATIVE 10/14/2023 1049   PROTEINUR 30 (A) 10/14/2023  1049   NITRITE NEGATIVE 10/14/2023 1049   LEUKOCYTESUR SMALL (A) 10/14/2023 1049   Sepsis Labs: Invalid input(s): PROCALCITONIN, LACTICIDVEN  Microbiology: Recent Results (from the past 240 hours)  Urine Culture (for pregnant, neutropenic or urologic patients or patients with an indwelling urinary catheter)     Status: None   Collection Time: 10/08/23  3:00 PM   Specimen: Urine, Clean Catch  Result Value Ref Range Status   Specimen Description   Final    URINE, CLEAN CATCH Performed at [facility], [address], [place], [place] [address]    Special Requests   Final    NONE Performed at  [facility], [address], [place], [place] [address]    Culture   Final    NO GROWTH Performed at [facility], [address], [place], [place] [address]    Report Status 10/09/2023 FINAL  Final  Culture, blood (Routine X 2) w Reflex to ID Panel     Status: None   Collection Time: 10/09/23  8:52 AM   Specimen: BLOOD LEFT ARM  Result Value Ref Range Status   Specimen Description   Final    BLOOD LEFT ARM Performed at [facility], [address] [place], [place] [address]    Special Requests   Final    BOTTLES DRAWN AEROBIC ONLY Blood Culture results may not be optimal due to an inadequate volume of blood received in culture bottles Performed at [facility], [address], [place], [place] [address]    Culture   Final    NO GROWTH 5 DAYS Performed at [facility], [address], [place], [place] [address]    Report Status 10/14/2023 FINAL  Final  Culture, blood (Routine X 2) w Reflex to ID Panel     Status: None   Collection Time: 10/09/23  9:09 AM   Specimen: BLOOD LEFT ARM  Result Value Ref Range Status   Specimen Description   Final    BLOOD LEFT ARM Performed at [facility], [address], [place], [place] [address]    Special Requests   Final    BOTTLES DRAWN AEROBIC ONLY Blood  Culture results may not be optimal due to an inadequate volume of blood received in culture bottles Performed at [facility], [address] [place], [place] [address]    Culture   Final    NO GROWTH 5 DAYS Performed at [facility], [address], [place], [place] [address]    Report Status 10/14/2023 FINAL  Final  Urine Culture (for pregnant, neutropenic or urologic patients or patients with an indwelling urinary catheter)     Status: Abnormal   Collection Time: 10/14/23 10:49 AM   Specimen: Urine, Clean Catch  Result Value Ref Range Status   Specimen Description   Final    URINE, CLEAN CATCH Performed at [facility], [address], [place], [place] [address]    Special Requests   Final    NONE Performed at [facility], [address] [place], [place] [address]    Culture (A)  Final    >=100,000 COLONIES/mL PROTEUS MIRABILIS Two isolates with different morphologies were identified as the same organism.The most resistant organism was reported. Performed at [facility], [address], [place], [place] [address]    Report Status 10/17/2023 FINAL  Final   Organism ID, Bacteria PROTEUS MIRABILIS (A)  Final      Susceptibility   Proteus mirabilis - MIC*    AMPICILLIN <=2 SENSITIVE Sensitive     CEFAZOLIN (URINE) Value in next row Sensitive      4 SENSITIVEThis is a modified FDA-approved test that has been validated and its performance characteristics determined by the reporting laboratory.  This laboratory is certified under the Clinical Laboratory Improvement Amendments CLIA as qualified to perform high complexity clinical laboratory testing.    CEFEPIME Value in next row Sensitive      4 SENSITIVEThis is a modified FDA-approved test that has been validated and its performance characteristics determined by the reporting laboratory.  This laboratory is certified under the Clinical Laboratory Improvement  Amendments CLIA as qualified to perform high complexity clinical laboratory testing.    ERTAPENEM Value in next row Sensitive      4 SENSITIVEThis is a modified FDA-approved test that has been validated and its performance characteristics determined by the reporting laboratory.  This laboratory is certified under the Clinical Laboratory Improvement Amendments CLIA as qualified to perform high complexity clinical laboratory testing.    CEFTRIAXONE  Value in next row Sensitive      4 SENSITIVEThis is a modified FDA-approved test that has been validated and its performance characteristics determined by the reporting laboratory.  This laboratory is certified under the Clinical Laboratory Improvement Amendments CLIA as qualified to perform high complexity clinical laboratory testing.    CIPROFLOXACIN  Value in next row Sensitive      4 SENSITIVEThis is a modified FDA-approved test that has been validated and its performance characteristics determined by the reporting laboratory.  This laboratory is certified under the Clinical Laboratory Improvement Amendments CLIA as qualified to perform high complexity clinical laboratory testing.    GENTAMICIN Value in next row Sensitive      4 SENSITIVEThis is a modified FDA-approved test that has been validated and its performance characteristics determined by the reporting laboratory.  This laboratory is certified under the Clinical Laboratory Improvement Amendments CLIA as qualified to perform high complexity clinical laboratory testing.    NITROFURANTOIN Value in next row Resistant      4 SENSITIVEThis is a modified FDA-approved test that has been validated and its performance characteristics determined by the reporting laboratory.  This laboratory is certified under the Clinical Laboratory Improvement Amendments CLIA as qualified to perform high complexity clinical laboratory testing.    TRIMETH/SULFA Value in next row Sensitive      4 SENSITIVEThis is a modified  FDA-approved test that has been validated and its performance characteristics determined by the reporting laboratory.  This laboratory is certified under the Clinical Laboratory Improvement Amendments CLIA as qualified to perform high complexity clinical laboratory testing.    AMPICILLIN/SULBACTAM Value in next row Sensitive      4 SENSITIVEThis is a modified FDA-approved test that has been validated and its performance characteristics determined by the reporting laboratory.  This laboratory is certified under the Clinical Laboratory Improvement Amendments CLIA as qualified to perform high complexity clinical laboratory testing.    PIP/TAZO Value in next row Sensitive      <=4 SENSITIVEThis is a modified FDA-approved test that has been validated and its performance characteristics determined by the reporting laboratory.  This laboratory is certified under the Clinical Laboratory Improvement Amendments CLIA as qualified to perform high complexity clinical laboratory testing.    MEROPENEM Value in next row Sensitive      <=4 SENSITIVEThis is a modified FDA-approved test that has been validated and its performance characteristics determined by the reporting laboratory.  This laboratory is certified under the Clinical Laboratory Improvement Amendments CLIA as qualified to perform high complexity clinical laboratory testing.    * >=100,000 COLONIES/mL PROTEUS MIRABILIS    Radiology Studies: No results found.    Haydyn Liddell T. [facility]  If 7PM-7AM, please contact night-coverage [email or web address] 10/17/2023, 12:08 PM

## 2023-10-17 NOTE — Plan of Care (Signed)
 Patient has been moved to liquid diet and is tolerating well.  She ambulated 300 feet today, and has expressed needs and received support related to anxiety.

## 2023-10-17 NOTE — Progress Notes (Signed)
 Progress Note  10 Days Post-Op  Subjective: Patient denies abdominal pain. +BM overnight. No nausea/vomiting this morning, NG was removed overnight.  ROS  All negative with the exception of above.  Objective: Vital signs in last 24 hours: Temp:  [97.5 F (36.4 C)-98.6 F (37 C)] 98.2 F (36.8 C) (10/03 0354) Pulse Rate:  [65-73] 73 (10/03 0354) Resp:  [17-18] 18 (10/03 0354) BP: (122-144)/(60-70) 128/61 (10/03 0354) SpO2:  [99 %-100 %] 100 % (10/03 0354) Weight:  [54 kg] 54 kg (10/03 0400) Last BM Date : 10/14/23  Intake/Output from previous day: 10/02 0701 - 10/03 0700 In: 1849.4 [I.V.:1359.3; NG/GT:90; IV Piggyback:400.1] Out: 2650 [Urine:2550; Emesis/NG output:100] Intake/Output this shift: No intake/output data recorded.  PE: General: Pleasant female who is laying in bed in NAD. HEENT: Head is normocephalic, atraumatic.  Sclera are non-injected. Conjunctiva anicteric.  Heart: HR normal. Lungs: Respiratory effort nonlabored. Abd: Soft, mild distention. Appropriately tender to palpation along midline incision. Midline incision with staples that are C/D/I. Skin: Warm and dry. GU: Foley catheter in place    Lab Results:  Recent Labs    10/16/23 0500 10/17/23 0335  WBC 10.6* 9.6  HGB 8.1* 8.1*  HCT 27.3* 27.6*  PLT 313 350   BMET Recent Labs    10/15/23 0402 10/16/23 0500  NA 142 143  K 4.0 4.1  CL 113* 116*  CO2 19* 16*  GLUCOSE 109* 101*  BUN 52* 51*  CREATININE 0.71 0.74  CALCIUM 9.6 10.0   PT/INR Recent Labs    10/17/23 0335  LABPROT 15.6*  INR 1.2   CMP     Component Value Date/Time   NA 143 10/16/2023 0500   NA 139 01/26/2020 0000   NA 141 04/24/2012 0929   K 4.1 10/16/2023 0500   K 4.4 04/24/2012 0929   CL 116 (H) 10/16/2023 0500   CL 109 (H) 04/24/2012 0929   CO2 16 (L) 10/16/2023 0500   CO2 23 04/24/2012 0929   GLUCOSE 101 (H) 10/16/2023 0500   GLUCOSE 85 04/24/2012 0929   BUN 51 (H) 10/16/2023 0500   BUN 19 01/26/2020  0000   BUN 20.6 04/24/2012 0929   CREATININE 0.74 10/16/2023 0500   CREATININE 0.79 04/18/2023 1524   CREATININE 0.9 04/24/2012 0929   CALCIUM 10.0 10/16/2023 0500   CALCIUM 10.5 (H) 01/12/2020 2123   CALCIUM 10.0 04/24/2012 0929   PROT 5.8 (L) 10/16/2023 0500   PROT 6.7 04/24/2012 0929   ALBUMIN  3.0 (L) 10/16/2023 0500   ALBUMIN  3.5 04/24/2012 0929   AST 17 10/16/2023 0500   AST 20 04/24/2012 0929   ALT 18 10/16/2023 0500   ALT 12 04/24/2012 0929   ALKPHOS 190 (H) 10/16/2023 0500   ALKPHOS 75 04/24/2012 0929   BILITOT 0.3 10/16/2023 0500   BILITOT 0.36 04/24/2012 0929   GFRNONAA >60 10/16/2023 0500   GFRNONAA 58 (L) 05/11/2020 0700   GFRAA 68 05/11/2020 0700   Lipase     Component Value Date/Time   LIPASE 22 09/18/2013 1810       Studies/Results: CT ABDOMEN PELVIS W CONTRAST Result Date: 10/16/2023 CLINICAL DATA:  Abdominal pain, post-op. EXAM: CT ABDOMEN AND PELVIS WITH CONTRAST TECHNIQUE: Multidetector CT imaging of the abdomen and pelvis was performed using the standard protocol following bolus administration of intravenous contrast. RADIATION DOSE REDUCTION: This exam was performed according to the departmental dose-optimization program which includes automated exposure control, adjustment of the mA and/or kV according to patient size and/or use of  iterative reconstruction technique. CONTRAST:  80mL OMNIPAQUE  IOHEXOL  300 MG/ML  SOLN COMPARISON:  CT scan abdomen and pelvis from 10/13/2023. FINDINGS: Lower chest: There is trace left pleural effusion, slightly decreased since the prior study and small right pleural effusion, similar to the prior study. There are associated atelectatic changes in the visualized bilateral lung bases. Normal heart size. No pericardial effusion. Hepatobiliary: The liver is normal in size. Non-cirrhotic configuration. No suspicious mass. Redemonstration of at least 3 hypoattenuating subcentimeter sized lesions in the liver, which are too small to  adequately characterize but favored to represent cysts. There is a 5 mm hyperattenuating focus in the right hepatic dome (series 2, image 9), incompletely characterized on the current exam but favored to represent a flash filling hemangioma. No intrahepatic or extrahepatic bile duct dilation. Vicarious excretion of contrast noted within the gallbladder. No calcified gallstones. Normal gallbladder wall thickness. No pericholecystic inflammatory changes. Pancreas: Unremarkable. No pancreatic ductal dilatation or surrounding inflammatory changes. Spleen: Within normal limits. No focal lesion. Adrenals/Urinary Tract: Adrenal glands are unremarkable. There are several bilateral hypoattenuating structures, which are too small to adequately characterize. However, the largest structure in the left kidney interpolar region, anteriorly measuring up to 1.1 x 1.1 cm, can be characterized as a simple cyst. No nephroureterolithiasis or obstructive uropathy. Sinus cyst noted in the left kidney upper pole. Urinary bladder is decompressed secondary to Foley catheter. Stomach/Bowel: There is at least small sliding hiatal hernia noted. Enteric tube is noted with its tip in the proximal stomach body region. No disproportionate dilation of the small or large bowel loops. No evidence of abnormal bowel wall thickening or inflammatory changes. The appendix was not visualized; however there is no acute inflammatory process in the right lower quadrant. Vascular/Lymphatic: There is mild interval decrease in the mild irregular hyperattenuating walled collection in the dependent pelvis currently measuring 3.1 x 6.6 cm (series 4, image 64). This may represent postoperative seroma or developing abscess. Correlate clinically. There are also multiple additional areas of fat stranding and very small noncommunicating mild irregular hyperattenuating walled collections in the left midabdomen abutting the descending colon which also exhibit  mild-to-moderate irregular wall thickening at this level. (Series 2, image 39 and series 4, image 49). This is nonspecific. Differential diagnosis includes postsurgical changes, small developing abscesses, tumor deposits, etc. No pneumoperitoneum. No abdominal or pelvic lymphadenopathy, by size criteria. No aneurysmal dilation of the major abdominal arteries. There are mild peripheral atherosclerotic vascular calcifications of the aorta and its major branches. Reproductive: The uterus is surgically absent. No large adnexal mass. Other: Midline surgical scar noted. No anterior abdominal wall abscess/collection or hematoma. Anterior skin staples noted. The soft tissues and abdominal wall are otherwise unremarkable. Musculoskeletal: No suspicious osseous lesions. There are mild - moderate multilevel degenerative changes in the visualized spine. IMPRESSION: 1. Interval resolution of previously seen bowel obstruction. 2. There is mild interval decrease in the mild irregular hyperattenuating walled collection in the dependent pelvis currently measuring 3.1 x 6.6 cm. This may represent postoperative seroma or developing abscess. 3. There are also multiple additional areas of fat stranding and very small noncommunicating mild irregular hyperattenuating walled collections in the left midabdomen abutting the descending colon which also exhibit mild-to-moderate irregular wall thickening at this level. This is nonspecific. Differential diagnosis includes postsurgical changes, small developing abscesses, tumor deposits, etc. 4. Multiple other nonacute observations, as described above. Aortic Atherosclerosis (ICD10-I70.0). Electronically Signed   By: Ree Molt M.D.   On: 10/16/2023 14:45    Anti-infectives:  Anti-infectives (From admission, onward)    Start     Dose/Rate Route Frequency Ordered Stop   10/14/23 0930  ceFEPIme (MAXIPIME) 2 g in sodium chloride  0.9 % 100 mL IVPB        2 g 200 mL/hr over 30 Minutes  Intravenous Every 12 hours 10/14/23 0820     10/14/23 0900  metroNIDAZOLE (FLAGYL) IVPB 500 mg        500 mg 100 mL/hr over 60 Minutes Intravenous 2 times daily 10/14/23 0811     10/07/23 0915  cefoTEtan  (CEFOTAN ) 2 g in sodium chloride  0.9 % 100 mL IVPB        2 g 200 mL/hr over 30 Minutes Intravenous On call to O.R. 10/07/23 0820 10/07/23 1938        Assessment/Plan SBO POD9 s/p dx lap converted to ex lap with LOA and repair of enterotomy, Dr. Dasie 9/23 - CT AP 9/29 reviewed independently and with IR - pelvic collection does appear to be developing and seems to communicate with other small collections but is too early to drain - Vitals stable. Afebrile. - WBC 9.6 from 10.6; HGB 8.1 (stable) - CT repeated 10/2 showed intra-abdominal collection is smaller, but it does appear more mature/thick-walled. Clinically the patient has a resolving ileus and her leukocytosis has resolved. Discussed with IR, will hold off on drainage since collection is smaller and patient improving, if there is a clinical change they can re-consider drainage. - Continue to mobilize as tolerated. - Pulm toilet, IS - Continue abx.  - Continue TPN; NGT fell out overnight and patient having bowel function -- start CLD.   Acute urinary retention - Foley placed 9/30, UA suggestive of possible UTI, UCx w/ proteus, per primary   FEN - CLD, TPN  VTE - heparin  gtt  ID - cefotetan  9/23 pre-op; cefepime/flagyl 9/30>> Foley - placed 9/30, UCx pending    - Per [facility] - Dementia  GERD Tremors  HTN  Anxiety  A fib RVR - on amio gtt, heparin  gtt  Anemia   LOS: 16 days   I reviewed specialist notes, nursing notes, last 24 h vitals and pain scores, last 48 h intake and output, last 24 h labs and trends, and last 24 h imaging results.   Almarie GORMAN Pringle, [facility] 10/17/2023, 9:24 AM Please see Amion for pager number during day hours 7:00am-4:30pm

## 2023-10-17 NOTE — Progress Notes (Signed)
 PHARMACY - TOTAL PARENTERAL NUTRITION CONSULT NOTE   Indication: Small bowel obstruction and likely post-op ileus  Patient Measurements: Height: 5' 2 (157.5 cm) Weight: 54 kg (119 lb 0.8 oz) IBW/kg (Calculated) : 50.1 TPN AdjBW (KG): 50.2 Body mass index is 21.77 kg/m.  Assessment:  83 YO female presenting 9/17 with abdominal pain and distension. Initial CT A/P showed high-grade small bowel obstruction with transition point in the pelvis to the right of the midline and mild mesenteric edema; repeat CT continued to show ongoing dilated small bowel loops. Patient taken to OR 9/23 for diagnostic laparoscopy with LOA and repair of prior small bowel enterotomy. NGT in place. Pharmacy consulted for TPN management in the setting of SBO.   Glucose / Insulin : No Hx DM - CBGs well controlled (goal 100-150) on goal rate TPN. CBG/s and SSI stopped 10/2 Electrolytes: Cl 116 high, Bicarb 16 low (acetate maxed) , Mg/Phos WNL, Ca 10 (none in TPN)-no new labs 10/3 Renal: BUN 51 elevated; SCr stable WNL <1 Hepatic: : albumin  3 down, alk phos 144>190 up, otherwise LFTs/Tbili WNL - TG 199 elevated (9/29 labs) I/O:  - Ng output: -100 mL/24 hrs but pulled out 10/3 - mIVF: none currently - LBM 10/3 noted by RN note  - UOP: 2550 mL/24 hrs GI Imaging: - 9/17 a/p: High-grade small bowel obstruction with transition point in the pelvis to the right of midline - 9/19 DG abd: SBO pattern with no improvement since the prior CT - 9/21 CT chest/a/p: Small bowel loops remain dilated in the abdomen and pelvis. There is some diluted contrast in the dilated small bowel loops, but no discernible contrast in the colonic lumen. The degree of small-bowel dilatation appears stable to mildly progressive in the interval - 9/23 AXR: persistent SBO with mild improvement - 9/24 AXR: some decompression of SB in upper abdomen noted 9/29 CT a/p: persistent SBO with transition point in RLQ, signs of colitis and basilar atelectasis.   GI Surgeries / Procedures:  - 9/23: Exploratory laparotomy with lysis of adhesions, primary repair of small bowel enterotomy  Central access: PICC obtained 9/23 TPN start date: 9/24  Nutritional Goals: Goal TPN rate is 65 mL/hr (provides 89 g of protein and 1619 kcals per day)  RD Assessment: Estimated Needs Total Energy Estimated Needs: 1500-1700 kcals Total Protein Estimated Needs: 75-90 grams Total Fluid Estimated Needs: >/= 1.5L  Current Nutrition:  NPO and TPN  Plan:  *Noted TPN tubing broken. TPN off with D10W since early this AM infusing.*  At 1800: Continue TPN at goal rate of 65 mL/hr Electrolytes in TPN:  Na 20 mEq/L K 30 mEq/L Ca 0 mEq/L Mg 0 mEq/L Phos 20 mmol/L Cl:Ac max Ac  Add standard MVI and trace elements to TPN Chromium remains on hold d/t nationwide shortage 10/2: D/c SSI and CBGs Further mIVF per MD (none currently) Monitor TPN labs on Mon/Thurs, and PRN   Juliane Guest Karoline Marina, PharmD, BCPS Clinical Staff Pharmacist 10/3/20258:19 AM

## 2023-10-17 NOTE — Progress Notes (Signed)
 Visit to room for DBIV. On arrival, noted TPN tubing broken. Part of TPN tubing found connected to PICC lumen 1; part of tubing found lying on the floor. Infusion stopped and PICC line assessed. RN notified; recommended calling pharmacy for possible dextrose  dosing until next TPN dose is ordered/available.

## 2023-10-17 NOTE — Progress Notes (Signed)
 Pt up to bedside commode and had a bowel movement. Pulled NG tube out while blowing her nose. No pain or nausea at this time. Pt returned to bed and resting.

## 2023-10-18 DIAGNOSIS — H9193 Unspecified hearing loss, bilateral: Secondary | ICD-10-CM | POA: Diagnosis not present

## 2023-10-18 DIAGNOSIS — K56609 Unspecified intestinal obstruction, unspecified as to partial versus complete obstruction: Secondary | ICD-10-CM | POA: Diagnosis not present

## 2023-10-18 DIAGNOSIS — D62 Acute posthemorrhagic anemia: Secondary | ICD-10-CM | POA: Diagnosis not present

## 2023-10-18 DIAGNOSIS — F0390 Unspecified dementia without behavioral disturbance: Secondary | ICD-10-CM | POA: Diagnosis not present

## 2023-10-18 LAB — CBC WITH DIFFERENTIAL/PLATELET
Abs Immature Granulocytes: 0.16 K/uL — ABNORMAL HIGH (ref 0.00–0.07)
Basophils Absolute: 0 K/uL (ref 0.0–0.1)
Basophils Relative: 1 %
Eosinophils Absolute: 0.1 K/uL (ref 0.0–0.5)
Eosinophils Relative: 1 %
HCT: 25.9 % — ABNORMAL LOW (ref 36.0–46.0)
Hemoglobin: 7.8 g/dL — ABNORMAL LOW (ref 12.0–15.0)
Immature Granulocytes: 2 %
Lymphocytes Relative: 17 %
Lymphs Abs: 1.5 K/uL (ref 0.7–4.0)
MCH: 31.5 pg (ref 26.0–34.0)
MCHC: 30.1 g/dL (ref 30.0–36.0)
MCV: 104.4 fL — ABNORMAL HIGH (ref 80.0–100.0)
Monocytes Absolute: 0.5 K/uL (ref 0.1–1.0)
Monocytes Relative: 6 %
Neutro Abs: 6.5 K/uL (ref 1.7–7.7)
Neutrophils Relative %: 73 %
Platelets: 365 K/uL (ref 150–400)
RBC: 2.48 MIL/uL — ABNORMAL LOW (ref 3.87–5.11)
RDW: 15.7 % — ABNORMAL HIGH (ref 11.5–15.5)
WBC: 8.8 K/uL (ref 4.0–10.5)
nRBC: 0 % (ref 0.0–0.2)

## 2023-10-18 LAB — BASIC METABOLIC PANEL WITH GFR
Anion gap: 7 (ref 5–15)
BUN: 40 mg/dL — ABNORMAL HIGH (ref 8–23)
CO2: 16 mmol/L — ABNORMAL LOW (ref 22–32)
Calcium: 9.9 mg/dL (ref 8.9–10.3)
Chloride: 120 mmol/L — ABNORMAL HIGH (ref 98–111)
Creatinine, Ser: 0.72 mg/dL (ref 0.44–1.00)
GFR, Estimated: >60 mL/min (ref >60)
Glucose, Bld: 134 mg/dL — ABNORMAL HIGH (ref 70–99)
Potassium: 3.8 mmol/L (ref 3.5–5.1)
Sodium: 143 mmol/L (ref 135–145)

## 2023-10-18 LAB — MAGNESIUM: Magnesium: 2.1 mg/dL (ref 1.7–2.4)

## 2023-10-18 LAB — PHOSPHORUS: Phosphorus: 2.9 mg/dL (ref 2.5–4.6)

## 2023-10-18 LAB — HEPARIN LEVEL (UNFRACTIONATED): Heparin Unfractionated: 0.49 [IU]/mL (ref 0.30–0.70)

## 2023-10-18 MED ORDER — SODIUM BICARBONATE 650 MG PO TABS
650.0000 mg | ORAL_TABLET | Freq: Three times a day (TID) | ORAL | Status: DC
  Administered 2023-10-18 – 2023-10-27 (×28): 650 mg via ORAL
  Filled 2023-10-18 (×29): qty 1

## 2023-10-18 MED ORDER — TRAVASOL 10 % IV SOLN
INTRAVENOUS | Status: AC
  Filled 2023-10-18: qty 889.2

## 2023-10-18 NOTE — Progress Notes (Signed)
 PHARMACY - ANTICOAGULATION CONSULT NOTE  Pharmacy Consult for heparin   Indication: atrial fibrillation  Allergies  Allergen Reactions   Lisinopril Cough   Penicillins Itching and Other (See Comments)    50 years ago   Adhesive [Tape] Itching   Atorvastatin Itching   Dilaudid  [Hydromorphone  Hcl] Itching    Patient Measurements: Height: 5' 2 (157.5 cm) Weight: 52.8 kg (116 lb 6.5 oz) IBW/kg (Calculated) : 50.1 HEPARIN  DW (KG): 50.2  Vital Signs: Temp: 98.8 F (37.1 C) (10/04 0443) Temp Source: Oral (10/04 0443) BP: 129/56 (10/04 0443) Pulse Rate: 62 (10/04 0443)  Labs: Recent Labs    10/16/23 0500 10/16/23 1508 10/17/23 0335 10/18/23 0037  HGB 8.1*  --  8.1* 7.8*  HCT 27.3*  --  27.6* 25.9*  PLT 313  --  350 365  LABPROT  --   --  15.6*  --   INR  --   --  1.2  --   HEPARINUNFRC 0.82* 0.69 0.58 0.49  CREATININE 0.74  --   --  0.72    Estimated Creatinine Clearance: 42.9 mL/min (by C-G formula based on SCr of 0.72 mg/dL).   Medical History: 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, possible    Tremors possible parkinsons   Manic disorder, single episode, in full remission 12/01/2009   Sixth nerve palsy      Assessment: Patient is an 83 y.o F who presented the ED on 10/01/23 with c/o constipation, abdominal pain and n/v.  She was subsequently found to have high-grade SBO and underwent exploratory laparotomy  with lysis of adhesions and repair of small bowel enterotomy on 10/07/23.  She is currently on heparin  drip for afib.  Today, 10/18/2023: - heparin  level is therapeutic at 0.49 - hgb down slightly to 7.8, plts 365K - no bleeding documented  Goal of Therapy:  Heparin  level 0.3-0.7 units/ml Monitor platelets by anticoagulation protocol: Yes   Plan:  - continue heparin  drip at 900 units/hr - daily heparin  level and cbc - monitor for s/sx bleeding   Fleetwood Pierron P 10/18/2023,7:56 AM

## 2023-10-18 NOTE — Progress Notes (Signed)
 PHARMACY - TOTAL PARENTERAL NUTRITION CONSULT NOTE   Indication: Small bowel obstruction and likely post-op ileus  Patient Measurements: Height: 5' 2 (157.5 cm) Weight: 52.8 kg (116 lb 6.5 oz) IBW/kg (Calculated) : 50.1 TPN AdjBW (KG): 50.2 Body mass index is 21.29 kg/m.  Assessment:  83 YO female presenting 9/17 with abdominal pain and distension. Initial CT A/P showed high-grade small bowel obstruction with transition point in the pelvis to the right of the midline and mild mesenteric edema; repeat CT continued to show ongoing dilated small bowel loops. Patient taken to OR 9/23 for diagnostic laparoscopy with LOA and repair of prior small bowel enterotomy. NGT in place. Pharmacy consulted for TPN management in the setting of SBO.   Glucose / Insulin : No Hx DM - CBGs well controlled (goal 100-150) on goal rate TPN. CBG/s and SSI stopped 10/2 Electrolytes: Cl 120 high, Bicarb 16 low (acetate maxed in TPN) , CorrCa elevated at 10.7 (none in TPN); other lytes wnl Renal: BUN 40  elevated; SCr stable WNL <1 Hepatic: : albumin  3 down, alk phos 144>190 up;  AST/ALT and Tbili WNL - TG 199 elevated (9/29 labs) I/O: +500 mL - UOP: 1800 mL - mIVF: none currently - LBM: 10/2 GI Imaging: - 9/17 a/p: High-grade small bowel obstruction with transition point in the pelvis to the right of midline - 9/19 DG abd: SBO pattern with no improvement since the prior CT - 9/21 CT chest/a/p: Small bowel loops remain dilated in the abdomen and pelvis. There is some diluted contrast in the dilated small bowel loops, but no discernible contrast in the colonic lumen. The degree of small-bowel dilatation appears stable to mildly progressive in the interval - 9/23 AXR: persistent SBO with mild improvement - 9/24 AXR: some decompression of SB in upper abdomen noted - 9/29 CT a/p: persistent SBO with transition point in RLQ, signs of colitis and basilar atelectasis.  - 10/2 abd CT: nterval resolution of previously  seen bowel obstruction.  wall collection - postoperative seroma or developing abscess?  GI Surgeries / Procedures:  - 9/23: Exploratory laparotomy with lysis of adhesions, primary repair of small bowel enterotomy  Central access: PICC obtained 9/23 TPN start date: 9/24  Nutritional Goals: Goal TPN rate is 65 mL/hr (provides 89 g of protein and 1619 kcals per day)  - 10/3: *Noted TPN tubing broken. TPN off with D10W since early this AM infusing  RD Assessment: Estimated Needs Total Energy Estimated Needs: 1500-1700 kcals Total Protein Estimated Needs: 75-90 grams Total Fluid Estimated Needs: >/= 1.5L  Current Nutrition:  NPO and TPN  Plan:   Today, at 1800: Continue TPN at goal rate of 65 mL/hr Electrolytes in TPN:  Na 20 mEq/L K 30 mEq/L Ca 0 mEq/L Mg 0 mEq/L Phos 20 mmol/L Cl:Ac max Ac  Add standard MVI and trace elements to TPN Chromium remains on hold d/t nationwide shortage 10/2: D/c SSI and CBGs Further mIVF per MD (none currently) CMET, mag and phos on 10/5 Monitor TPN labs on Mon/Thurs, and PRN   Iantha Batch, PharmD, BCPS 10/18/2023 8:21 AM

## 2023-10-18 NOTE — Progress Notes (Signed)
 PROGRESS NOTE  Tracey [name] FMW:[id number] DOB: 01-20-1940   PCP: Mast, Man X, NP  Patient is from: Home.  DOA: 10/01/2023 LOS: 17  Chief complaints Chief Complaint  Patient presents with   Constipation     Brief Narrative / Interim history: 83 year old F with PMH of cognitive impairment, possible Parkinson's disease, GIST, bipolar disorder and chronic neck pain presenting with abdominal pain and distention and admitted with SBO.  CT showed high-grade SBO with transition point in the pelvis to the right of the midline and mild mesenteric edema.  She failed conservative management with NG tube decompression.  Repeat CT on 9/21 with ongoing SBO .  Eventually, she underwent diagnostic laparoscopy with LOA and repair of prior small bowel enterotomy on 9/23.  Hospital course complicated by postop ileus requiring NG tube and TPN.  She also had worsening leukocytosis.  Repeat CT abdomen and pelvis on 9/29 showed persistent SBO with transition point in RLQ, new mild regional inflammatory/edematous change as well as scattered peripheral enhancing small fluid collections concerning for colitis.  Patient was started on IV cefepime and Flagyl by general surgery.  Repeat CT abdomen and pelvis on 10/2 with interval resolution of SBO and mild interval decrease in intra-abdominal fluid collection.  IR reengaged and recommended holding off IR drainage since collection is smaller and patient is improving.  Patient pulled out NG tube the night of 10/2.  Had a bowel movement.  No nausea or vomiting.  Advance to full liquid diet.  Also on TPN.  General surgery following.  Subjective: Seen and examined earlier this afternoon.  No major events overnight or this morning.  Complaining of severe neck pain.  Waiting on pain medication.  Denies nausea, vomiting or abdominal pain.  Does not recall when her last bowel movement is.  Seems to be 10/2 per flowsheet.  Patient's son at bedside.  Objective: Vitals:    10/17/23 0400 10/17/23 2023 10/18/23 0443 10/18/23 1620  BP:  135/67 (!) 129/56 117/73  Pulse:  66 62 (!) 53  Resp:  20 (!) 24 14  Temp:  97.8 F (36.6 C) 98.8 F (37.1 C) 97.9 F (36.6 C)  TempSrc:  Oral Oral Oral  SpO2:  100% 100% 100%  Weight: 54 kg  52.8 kg   Height:        Examination:  GENERAL: Sitting on bedside chair. HEENT: MMM.  Vision and hearing grossly intact.  NECK: Tenderness over paraspinal muscles.  No tenderness over spinous process.  Fair ROM RESP:  No IWOB.  Fair aeration bilaterally. CVS:  RRR. Heart sounds normal.  ABD/GI/GU: BS+. Abd soft, NTND.  Foley catheter in place. MSK/EXT:  Moves extremities. No apparent deformity. No edema.  SKIN: no apparent skin lesion or wound NEURO: AA.  Oriented x 4 except date.  No apparent focal neuro deficit. PSYCH: Calm. Normal affect.   Consultants:  General Surgery  Procedures: 9/23-diagnostic laparoscopy, LOA and repair of prior small bowel enterotomy   Microbiology summarized: 9/20-MRSA PCR screen nonreactive 9/20-blood cultures negative. 9/25-blood cultures negative. 9/30-urine culture with pansensitive Proteus mirabilis except to Macrobid  Assessment and plan: Small bowel obstruction: Presents with abdominal pain and distention.  CT confirmed high-grade SBO with transition point in RLQ.  Failed conservative management with NG tube decompression.  Repeat CT on 9/21 with persistent SBO.  Underwent diagnostic laparoscopy, LOA and repair of prior small bowel enterotomy on 9/23.  Postop was complicated by prolonged ileus, possible colitis and intra-abdominal abscesses as noted on repeat  CT on 9/29.  Repeat CT on 10/2 with resolution of SBO and improved intra-abdominal fluid collection.  NG tube came out on 10/2.  No nausea, vomiting or abdominal pain.  -General surgery following-advanced to full liquid diet -Continue IV cefepime and Flagyl -Continue TPN.   -Mobilize patient   Postoperative blood loss anemia?  Noted some fluctuation but seems to be at baseline.  Received IV iron  earlier in the course. Recent Labs    10/11/23 0401 10/12/23 0538 10/13/23 0420 10/13/23 1532 10/14/23 0331 10/14/23 0451 10/15/23 0402 10/16/23 0500 10/17/23 0335 10/18/23 0037  HGB 10.8* 11.1* 9.7* 9.3* 8.7* 9.0* 8.1* 8.1* 8.1* 7.8*  - Continue monitoring - Continue IV Protonix  - Monitor H&H.  Paroxysmal A-fib with RVR: Likely in the setting of #1.  Required IV amiodarone  at 1 point. -Cardiology recs: resuming p.o. amiodarone  100 mg daily when able to take p.o.-resumed on 10/3 - On IV heparin  for anticoagulation - Cardiology signed off - Optimize electrolytes  Possible postoperative ileus: Seems to have resolved. -Management as above. -Mobilize patient  Hypotension: Normotensive for most part. - Continue monitoring  Dementia without behavioral disturbance: Oriented x 4 except date. - Reorientation and delirium precaution.  Hypokalemia/hypophosphatemia - Pharmacy replenishing with TPN.   Chronic neck pain/cervicalgia: Chronic.  Rates her pain 10/10 but she is not a reliable historian.  -Continue pain meds, muscle relaxers and Voltaren  gel. - OOB  Concern for otitis externa -Ciprodex  otic 4 drops to the left ear twice daily x 5 days.   Acute urinary retention - Continue Foley catheter - Will attempt voiding trial if ambulatory  UTI?  UA concerning.  Urine culture with Proteus mirabilis. - Already on antibiotics   Severe malnutrition Body mass index is 21.29 kg/m. Nutrition Problem: Severe Malnutrition Etiology: chronic illness Signs/Symptoms: severe fat depletion, severe muscle depletion, percent weight loss (11% in 6 months) Percent weight loss: 11 % (in 6 months) Interventions: Refer to RD note for recommendations   DVT prophylaxis:  Place and maintain sequential compression device Start: 10/13/23 0945 SCDs Start: 10/01/23 2349  Code Status: DNR. Family Communication: Updated  patient's son at bedside Level of care: Telemetry Status is: Inpatient Remains inpatient appropriate because: SBO, postop ileus, intra-abdominal infection   Final disposition: SNF   55 minutes with more than 50% spent in reviewing records, counseling patient/family and coordinating care.   Sch Meds:  Scheduled Meds:  acetaminophen   650 mg Oral Once   amiodarone   100 mg Oral Daily   Chlorhexidine  Gluconate Cloth  6 each Topical QHS   cycloSPORINE   1 drop Both Eyes BID   latanoprost   1 drop Both Eyes QHS   lidocaine   1 patch Transdermal Q24H   pantoprazole  (PROTONIX ) IV  80 mg Intravenous Q12H   sodium chloride  flush  10-40 mL Intracatheter Q12H   timolol   1 drop Both Eyes Daily   Continuous Infusions:  ceFEPime (MAXIPIME) IV 2 g (10/18/23 1026)   heparin  900 Units/hr (10/17/23 2332)   metronidazole 500 mg (10/18/23 1258)   TPN ADULT (ION) 65 mL/hr at 10/17/23 1804   TPN ADULT (ION)     PRN Meds:.artificial tears, ketorolac , LORazepam , methocarbamol  (ROBAXIN ) injection, morphine  injection, Muscle Rub, ondansetron  **OR** ondansetron  (ZOFRAN ) IV, mouth rinse, sodium chloride  flush  Antimicrobials: Anti-infectives (From admission, onward)    Start     Dose/Rate Route Frequency Ordered Stop   10/14/23 0930  ceFEPIme (MAXIPIME) 2 g in sodium chloride  0.9 % 100 mL IVPB  2 g 200 mL/hr over 30 Minutes Intravenous Every 12 hours 10/14/23 0820     10/14/23 0900  metroNIDAZOLE (FLAGYL) IVPB 500 mg        500 mg 100 mL/hr over 60 Minutes Intravenous 2 times daily 10/14/23 0811     10/07/23 0915  cefoTEtan  (CEFOTAN ) 2 g in sodium chloride  0.9 % 100 mL IVPB        2 g 200 mL/hr over 30 Minutes Intravenous On call to O.R. 10/07/23 0820 10/07/23 1938        I have personally reviewed the following labs and images: CBC: Recent Labs  Lab 10/14/23 0451 10/15/23 0402 10/16/23 0500 10/17/23 0335 10/18/23 0037  WBC 15.4* 10.4 10.6* 9.6 8.8  NEUTROABS 12.6* 7.9* 8.2* 7.5 6.5   HGB 9.0* 8.1* 8.1* 8.1* 7.8*  HCT 28.8* 26.7* 27.3* 27.6* 25.9*  MCV 102.1* 102.3* 102.6* 104.2* 104.4*  PLT 231 256 313 350 365   BMP &GFR Recent Labs  Lab 10/13/23 0420 10/14/23 0451 10/15/23 0402 10/16/23 0500 10/18/23 0037  NA 139 141 142 143 143  K 4.6 4.9 4.0 4.1 3.8  CL 107 109 113* 116* 120*  CO2 23 22 19* 16* 16*  GLUCOSE 126* 110* 109* 101* 134*  BUN 45* 51* 52* 51* 40*  CREATININE 0.82 0.77 0.71 0.74 0.72  CALCIUM 9.9 9.9 9.6 10.0 9.9  MG 2.5* 2.5* 2.4 2.2 2.1  PHOS 3.3 3.7 3.5 3.2 2.9   Estimated Creatinine Clearance: 42.9 mL/min (by C-G formula based on SCr of 0.72 mg/dL). Liver & Pancreas: Recent Labs  Lab 10/13/23 0420 10/16/23 0500  AST 14* 17  ALT 11 18  ALKPHOS 144* 190*  BILITOT 0.2 0.3  PROT 5.8* 5.8*  ALBUMIN  3.3* 3.0*   No results for input(s): LIPASE, AMYLASE in the last 168 hours. No results for input(s): AMMONIA in the last 168 hours. Diabetic: No results for input(s): HGBA1C in the last 72 hours. Recent Labs  Lab 10/14/23 2352 10/15/23 0757 10/15/23 1631 10/15/23 2344 10/16/23 0732  GLUCAP 129* 111* 107* 121* 116*   Cardiac Enzymes: No results for input(s): CKTOTAL, CKMB, CKMBINDEX, TROPONINI in the last 168 hours. Recent Labs    10/04/23 1310  PROBNP 529.0*   Coagulation Profile: Recent Labs  Lab 10/17/23 0335  INR 1.2   Thyroid  Function Tests: No results for input(s): TSH, T4TOTAL, FREET4, T3FREE, THYROIDAB in the last 72 hours. Lipid Profile: No results for input(s): CHOL, HDL, LDLCALC, TRIG, CHOLHDL, LDLDIRECT in the last 72 hours.  Anemia Panel: No results for input(s): VITAMINB12, FOLATE, FERRITIN, TIBC, IRON , RETICCTPCT in the last 72 hours. Urine analysis:    Component Value Date/Time   COLORURINE YELLOW 10/14/2023 1049   APPEARANCEUR HAZY (A) 10/14/2023 1049   LABSPEC 1.017 10/14/2023 1049   PHURINE 9.0 (H) 10/14/2023 1049   GLUCOSEU NEGATIVE 10/14/2023  1049   HGBUR NEGATIVE 10/14/2023 1049   BILIRUBINUR NEGATIVE 10/14/2023 1049   KETONESUR NEGATIVE 10/14/2023 1049   PROTEINUR 30 (A) 10/14/2023 1049   NITRITE NEGATIVE 10/14/2023 1049   LEUKOCYTESUR SMALL (A) 10/14/2023 1049   Sepsis Labs: Invalid input(s): PROCALCITONIN, LACTICIDVEN  Microbiology: Recent Results (from the past 240 hours)  Culture, blood (Routine X 2) w Reflex to ID Panel     Status: None   Collection Time: 10/09/23  8:52 AM   Specimen: BLOOD LEFT ARM  Result Value Ref Range Status   Specimen Description   Final    BLOOD LEFT ARM Performed at [facility]  Lab, [address], [place], [place] [address]    Special Requests   Final    BOTTLES DRAWN AEROBIC ONLY Blood Culture results may not be optimal due to an inadequate volume of blood received in culture bottles Performed at [facility], [address] [place], [place] [address]    Culture   Final    NO GROWTH 5 DAYS Performed at [facility], [address], [place], [place] [address]    Report Status 10/14/2023 FINAL  Final  Culture, blood (Routine X 2) w Reflex to ID Panel     Status: None   Collection Time: 10/09/23  9:09 AM   Specimen: BLOOD LEFT ARM  Result Value Ref Range Status   Specimen Description   Final    BLOOD LEFT ARM Performed at [facility], [address], [place], [place] [address]    Special Requests   Final    BOTTLES DRAWN AEROBIC ONLY Blood Culture results may not be optimal due to an inadequate volume of blood received in culture bottles Performed at [facility], [address], [place], [place] [address]    Culture   Final    NO GROWTH 5 DAYS Performed at [facility], [address], [place], [place] [address]    Report Status 10/14/2023 FINAL  Final  Urine Culture (for pregnant, neutropenic or urologic patients or patients with an indwelling urinary catheter)     Status: Abnormal   Collection Time: 10/14/23  10:49 AM   Specimen: Urine, Clean Catch  Result Value Ref Range Status   Specimen Description   Final    URINE, CLEAN CATCH Performed at [facility], [address], [place], [place] [address]    Special Requests   Final    NONE Performed at [facility], [address] [place], [place] [address]    Culture (A)  Final    >=100,000 COLONIES/mL PROTEUS MIRABILIS Two isolates with different morphologies were identified as the same organism.The most resistant organism was reported. Performed at [facility], [address], [place], [place] [address]    Report Status 10/17/2023 FINAL  Final   Organism ID, Bacteria PROTEUS MIRABILIS (A)  Final      Susceptibility   Proteus mirabilis - MIC*    AMPICILLIN <=2 SENSITIVE Sensitive     CEFAZOLIN (URINE) Value in next row Sensitive      4 SENSITIVEThis is a modified FDA-approved test that has been validated and its performance characteristics determined by the reporting laboratory.  This laboratory is certified under the Clinical Laboratory Improvement Amendments CLIA as qualified to perform high complexity clinical laboratory testing.    CEFEPIME Value in next row Sensitive      4 SENSITIVEThis is a modified FDA-approved test that has been validated and its performance characteristics determined by the reporting laboratory.  This laboratory is certified under the Clinical Laboratory Improvement Amendments CLIA as qualified to perform high complexity clinical laboratory testing.    ERTAPENEM Value in next row Sensitive      4 SENSITIVEThis is a modified FDA-approved test that has been validated and its performance characteristics determined by the reporting laboratory.  This laboratory is certified under the Clinical Laboratory Improvement Amendments CLIA as qualified to perform high complexity clinical laboratory testing.    CEFTRIAXONE  Value in next row Sensitive      4 SENSITIVEThis is a modified  FDA-approved test that has  been validated and its performance characteristics determined by the reporting laboratory.  This laboratory is certified under the Clinical Laboratory Improvement Amendments CLIA as qualified to perform high complexity clinical laboratory testing.    CIPROFLOXACIN  Value in next row Sensitive      4 SENSITIVEThis is a modified FDA-approved test that has been validated and its performance characteristics determined by the reporting laboratory.  This laboratory is certified under the Clinical Laboratory Improvement Amendments CLIA as qualified to perform high complexity clinical laboratory testing.    GENTAMICIN Value in next row Sensitive      4 SENSITIVEThis is a modified FDA-approved test that has been validated and its performance characteristics determined by the reporting laboratory.  This laboratory is certified under the Clinical Laboratory Improvement Amendments CLIA as qualified to perform high complexity clinical laboratory testing.    NITROFURANTOIN Value in next row Resistant      4 SENSITIVEThis is a modified FDA-approved test that has been validated and its performance characteristics determined by the reporting laboratory.  This laboratory is certified under the Clinical Laboratory Improvement Amendments CLIA as qualified to perform high complexity clinical laboratory testing.    TRIMETH/SULFA Value in next row Sensitive      4 SENSITIVEThis is a modified FDA-approved test that has been validated and its performance characteristics determined by the reporting laboratory.  This laboratory is certified under the Clinical Laboratory Improvement Amendments CLIA as qualified to perform high complexity clinical laboratory testing.    AMPICILLIN/SULBACTAM Value in next row Sensitive      4 SENSITIVEThis is a modified FDA-approved test that has been validated and its performance characteristics determined by the reporting laboratory.  This laboratory is certified under the  Clinical Laboratory Improvement Amendments CLIA as qualified to perform high complexity clinical laboratory testing.    PIP/TAZO Value in next row Sensitive      <=4 SENSITIVEThis is a modified FDA-approved test that has been validated and its performance characteristics determined by the reporting laboratory.  This laboratory is certified under the Clinical Laboratory Improvement Amendments CLIA as qualified to perform high complexity clinical laboratory testing.    MEROPENEM Value in next row Sensitive      <=4 SENSITIVEThis is a modified FDA-approved test that has been validated and its performance characteristics determined by the reporting laboratory.  This laboratory is certified under the Clinical Laboratory Improvement Amendments CLIA as qualified to perform high complexity clinical laboratory testing.    * >=100,000 COLONIES/mL PROTEUS MIRABILIS    Radiology Studies: No results found.    Heiress Williamson T. Rayn Enderson [facility]  If 7PM-7AM, please contact night-coverage [email or web address] 10/18/2023, 4:53 PM

## 2023-10-18 NOTE — Progress Notes (Signed)
 11 Days Post-Op   Subjective/Chief Complaint: Pt complains of headache Not eating much Denies nausea   Objective: Vital signs in last 24 hours: Temp:  [97.8 F (36.6 C)-98.8 F (37.1 C)] 98.8 F (37.1 C) (10/04 0443) Pulse Rate:  [62-66] 62 (10/04 0443) Resp:  [20-24] 24 (10/04 0443) BP: (129-135)/(56-67) 129/56 (10/04 0443) SpO2:  [100 %] 100 % (10/04 0443) Weight:  [52.8 kg] 52.8 kg (10/04 0443) Last BM Date : 10/16/23  Intake/Output from previous day: 10/03 0701 - 10/04 0700 In: 2300.4 [I.V.:1900.4; IV Piggyback:400] Out: 1800 [Urine:1800] Intake/Output this shift: No intake/output data recorded.  Exam: Awake and alert Laying on side secondary to headache Abdomen soft, non-distended Incision clean  Lab Results:  Recent Labs    10/17/23 0335 10/18/23 0037  WBC 9.6 8.8  HGB 8.1* 7.8*  HCT 27.6* 25.9*  PLT 350 365   BMET Recent Labs    10/16/23 0500 10/18/23 0037  NA 143 143  K 4.1 3.8  CL 116* 120*  CO2 16* 16*  GLUCOSE 101* 134*  BUN 51* 40*  CREATININE 0.74 0.72  CALCIUM 10.0 9.9   PT/INR Recent Labs    10/17/23 0335  LABPROT 15.6*  INR 1.2   ABG No results for input(s): PHART, HCO3 in the last 72 hours.  Invalid input(s): PCO2, PO2  Studies/Results: CT ABDOMEN PELVIS W CONTRAST Result Date: 10/16/2023 CLINICAL DATA:  Abdominal pain, post-op. EXAM: CT ABDOMEN AND PELVIS WITH CONTRAST TECHNIQUE: Multidetector CT imaging of the abdomen and pelvis was performed using the standard protocol following bolus administration of intravenous contrast. RADIATION DOSE REDUCTION: This exam was performed according to the departmental dose-optimization program which includes automated exposure control, adjustment of the mA and/or kV according to patient size and/or use of iterative reconstruction technique. CONTRAST:  80mL OMNIPAQUE  IOHEXOL  300 MG/ML  SOLN COMPARISON:  CT scan abdomen and pelvis from 10/13/2023. FINDINGS: Lower chest: There is trace  left pleural effusion, slightly decreased since the prior study and small right pleural effusion, similar to the prior study. There are associated atelectatic changes in the visualized bilateral lung bases. Normal heart size. No pericardial effusion. Hepatobiliary: The liver is normal in size. Non-cirrhotic configuration. No suspicious mass. Redemonstration of at least 3 hypoattenuating subcentimeter sized lesions in the liver, which are too small to adequately characterize but favored to represent cysts. There is a 5 mm hyperattenuating focus in the right hepatic dome (series 2, image 9), incompletely characterized on the current exam but favored to represent a flash filling hemangioma. No intrahepatic or extrahepatic bile duct dilation. Vicarious excretion of contrast noted within the gallbladder. No calcified gallstones. Normal gallbladder wall thickness. No pericholecystic inflammatory changes. Pancreas: Unremarkable. No pancreatic ductal dilatation or surrounding inflammatory changes. Spleen: Within normal limits. No focal lesion. Adrenals/Urinary Tract: Adrenal glands are unremarkable. There are several bilateral hypoattenuating structures, which are too small to adequately characterize. However, the largest structure in the left kidney interpolar region, anteriorly measuring up to 1.1 x 1.1 cm, can be characterized as a simple cyst. No nephroureterolithiasis or obstructive uropathy. Sinus cyst noted in the left kidney upper pole. Urinary bladder is decompressed secondary to Foley catheter. Stomach/Bowel: There is at least small sliding hiatal hernia noted. Enteric tube is noted with its tip in the proximal stomach body region. No disproportionate dilation of the small or large bowel loops. No evidence of abnormal bowel wall thickening or inflammatory changes. The appendix was not visualized; however there is no acute inflammatory process in the right  lower quadrant. Vascular/Lymphatic: There is mild interval  decrease in the mild irregular hyperattenuating walled collection in the dependent pelvis currently measuring 3.1 x 6.6 cm (series 4, image 64). This may represent postoperative seroma or developing abscess. Correlate clinically. There are also multiple additional areas of fat stranding and very small noncommunicating mild irregular hyperattenuating walled collections in the left midabdomen abutting the descending colon which also exhibit mild-to-moderate irregular wall thickening at this level. (Series 2, image 39 and series 4, image 49). This is nonspecific. Differential diagnosis includes postsurgical changes, small developing abscesses, tumor deposits, etc. No pneumoperitoneum. No abdominal or pelvic lymphadenopathy, by size criteria. No aneurysmal dilation of the major abdominal arteries. There are mild peripheral atherosclerotic vascular calcifications of the aorta and its major branches. Reproductive: The uterus is surgically absent. No large adnexal mass. Other: Midline surgical scar noted. No anterior abdominal wall abscess/collection or hematoma. Anterior skin staples noted. The soft tissues and abdominal wall are otherwise unremarkable. Musculoskeletal: No suspicious osseous lesions. There are mild - moderate multilevel degenerative changes in the visualized spine. IMPRESSION: 1. Interval resolution of previously seen bowel obstruction. 2. There is mild interval decrease in the mild irregular hyperattenuating walled collection in the dependent pelvis currently measuring 3.1 x 6.6 cm. This may represent postoperative seroma or developing abscess. 3. There are also multiple additional areas of fat stranding and very small noncommunicating mild irregular hyperattenuating walled collections in the left midabdomen abutting the descending colon which also exhibit mild-to-moderate irregular wall thickening at this level. This is nonspecific. Differential diagnosis includes postsurgical changes, small developing  abscesses, tumor deposits, etc. 4. Multiple other nonacute observations, as described above. Aortic Atherosclerosis (ICD10-I70.0). Electronically Signed   By: Ree Molt M.D.   On: 10/16/2023 14:45    Anti-infectives: Anti-infectives (From admission, onward)    Start     Dose/Rate Route Frequency Ordered Stop   10/14/23 0930  ceFEPIme (MAXIPIME) 2 g in sodium chloride  0.9 % 100 mL IVPB        2 g 200 mL/hr over 30 Minutes Intravenous Every 12 hours 10/14/23 0820     10/14/23 0900  metroNIDAZOLE (FLAGYL) IVPB 500 mg        500 mg 100 mL/hr over 60 Minutes Intravenous 2 times daily 10/14/23 0811     10/07/23 0915  cefoTEtan  (CEFOTAN ) 2 g in sodium chloride  0.9 % 100 mL IVPB        2 g 200 mL/hr over 30 Minutes Intravenous On call to O.R. 10/07/23 0820 10/07/23 1938       Assessment/Plan: SBO POD 10 s/p dx lap converted to ex lap with LOA and repair of enterotomy, Dr. Dasie 9/23 - CT AP 9/29 reviewed independently and with IR - pelvic collection does appear to be developing and seems to communicate with other small collections but is too early to drain - Vitals stable. Afebrile. - WBC  8.8, Hgb 7.8 (stable) - CT repeated 10/2 showed intra-abdominal collection is smaller, but it does appear more mature/thick-walled. Clinically the patient has a resolving ileus and her leukocytosis has resolved. Discussed with IR, will hold off on drainage since collection is smaller and patient improving, if there is a clinical change they can re-consider drainage.  - Per [facility] - Dementia  GERD Tremors  HTN  Anxiety  A fib RVR - on amio gtt, heparin  gtt  Anemia   -on fulls but not taking in much -continuing current care  Vicenta Poli 10/18/2023

## 2023-10-18 NOTE — Progress Notes (Signed)
 Mobility Specialist - Progress Note   10/18/23 1511  Mobility  Activity Ambulated with assistance  Level of Assistance Contact guard assist, steadying assist  Assistive Device Front wheel walker  Distance Ambulated (ft) 90 ft  Range of Motion/Exercises Active  Activity Response Tolerated well  Mobility Referral Yes  Mobility visit 1 Mobility  Mobility Specialist Start Time (ACUTE ONLY) 1455  Mobility Specialist Stop Time (ACUTE ONLY) 1511  Mobility Specialist Time Calculation (min) (ACUTE ONLY) 16 min   Pt was found on recliner chair and agreeable to mobilize. Stated feeling fatigued. At EOS returned to bed with all needs met. Call bell in reach and RN notified.   Erminio Leos,  Mobility Specialist Can be reached via Secure Chat

## 2023-10-18 NOTE — Plan of Care (Signed)
  Problem: Education: Goal: Knowledge of General Education information will improve Description: Including pain rating scale, medication(s)/side effects and non-pharmacologic comfort measures 10/18/2023 0407 by Waneta Marylynn SAUNDERS, RN Outcome: Progressing 10/18/2023 0319 by Waneta Marylynn SAUNDERS, RN Outcome: Progressing   Problem: Health Behavior/Discharge Planning: Goal: Ability to manage health-related needs will improve Outcome: Progressing

## 2023-10-18 NOTE — Plan of Care (Signed)
   Problem: Education: Goal: Knowledge of General Education information will improve Description Including pain rating scale, medication(s)/side effects and non-pharmacologic comfort measures Outcome: Progressing   Problem: Health Behavior/Discharge Planning: Goal: Ability to manage health-related needs will improve Outcome: Progressing

## 2023-10-19 DIAGNOSIS — K56609 Unspecified intestinal obstruction, unspecified as to partial versus complete obstruction: Secondary | ICD-10-CM | POA: Diagnosis not present

## 2023-10-19 DIAGNOSIS — D62 Acute posthemorrhagic anemia: Secondary | ICD-10-CM | POA: Diagnosis not present

## 2023-10-19 DIAGNOSIS — H9193 Unspecified hearing loss, bilateral: Secondary | ICD-10-CM | POA: Diagnosis not present

## 2023-10-19 DIAGNOSIS — F0390 Unspecified dementia without behavioral disturbance: Secondary | ICD-10-CM | POA: Diagnosis not present

## 2023-10-19 LAB — CBC WITH DIFFERENTIAL/PLATELET
Abs Immature Granulocytes: 0.2 K/uL — ABNORMAL HIGH (ref 0.00–0.07)
Basophils Absolute: 0.1 K/uL (ref 0.0–0.1)
Basophils Relative: 1 %
Eosinophils Absolute: 0.1 K/uL (ref 0.0–0.5)
Eosinophils Relative: 2 %
HCT: 25.2 % — ABNORMAL LOW (ref 36.0–46.0)
Hemoglobin: 7.6 g/dL — ABNORMAL LOW (ref 12.0–15.0)
Immature Granulocytes: 2 %
Lymphocytes Relative: 17 %
Lymphs Abs: 1.4 K/uL (ref 0.7–4.0)
MCH: 31.3 pg (ref 26.0–34.0)
MCHC: 30.2 g/dL (ref 30.0–36.0)
MCV: 103.7 fL — ABNORMAL HIGH (ref 80.0–100.0)
Monocytes Absolute: 0.5 K/uL (ref 0.1–1.0)
Monocytes Relative: 6 %
Neutro Abs: 6 K/uL (ref 1.7–7.7)
Neutrophils Relative %: 72 %
Platelets: 392 K/uL (ref 150–400)
RBC: 2.43 MIL/uL — ABNORMAL LOW (ref 3.87–5.11)
RDW: 15.7 % — ABNORMAL HIGH (ref 11.5–15.5)
WBC: 8.2 K/uL (ref 4.0–10.5)
nRBC: 0 % (ref 0.0–0.2)

## 2023-10-19 LAB — COMPREHENSIVE METABOLIC PANEL WITH GFR
ALT: 10 U/L (ref 0–44)
AST: 14 U/L — ABNORMAL LOW (ref 15–41)
Albumin: 2.8 g/dL — ABNORMAL LOW (ref 3.5–5.0)
Alkaline Phosphatase: 118 U/L (ref 38–126)
Anion gap: 8 (ref 5–15)
BUN: 37 mg/dL — ABNORMAL HIGH (ref 8–23)
CO2: 16 mmol/L — ABNORMAL LOW (ref 22–32)
Calcium: 9.9 mg/dL (ref 8.9–10.3)
Chloride: 119 mmol/L — ABNORMAL HIGH (ref 98–111)
Creatinine, Ser: 0.62 mg/dL (ref 0.44–1.00)
GFR, Estimated: >60 mL/min (ref >60)
Glucose, Bld: 124 mg/dL — ABNORMAL HIGH (ref 70–99)
Potassium: 3.8 mmol/L (ref 3.5–5.1)
Sodium: 143 mmol/L (ref 135–145)
Total Bilirubin: <0.2 mg/dL (ref 0.0–1.2)
Total Protein: 5.3 g/dL — ABNORMAL LOW (ref 6.5–8.1)

## 2023-10-19 LAB — MAGNESIUM: Magnesium: 2.1 mg/dL (ref 1.7–2.4)

## 2023-10-19 LAB — HEPARIN LEVEL (UNFRACTIONATED): Heparin Unfractionated: 0.52 [IU]/mL (ref 0.30–0.70)

## 2023-10-19 LAB — PHOSPHORUS: Phosphorus: 2.9 mg/dL (ref 2.5–4.6)

## 2023-10-19 MED ORDER — TRAVASOL 10 % IV SOLN
INTRAVENOUS | Status: AC
  Filled 2023-10-19: qty 889.2

## 2023-10-19 NOTE — Plan of Care (Signed)
   Problem: Education: Goal: Knowledge of General Education information will improve Description Including pain rating scale, medication(s)/side effects and non-pharmacologic comfort measures Outcome: Progressing   Problem: Health Behavior/Discharge Planning: Goal: Ability to manage health-related needs will improve Outcome: Progressing

## 2023-10-19 NOTE — Progress Notes (Signed)
 PROGRESS NOTE  Tracey [name] FMW:[id number] DOB: [birth year]   PCP: Mast, Man X, NP  Patient is from: Home.  DOA: 10/01/2023 LOS: 18  Chief complaints Chief Complaint  Patient presents with   Constipation     Brief Narrative / Interim history: 83 year old F with PMH of cognitive impairment, possible Parkinson's disease, GIST, bipolar disorder and chronic neck pain presenting with abdominal pain and distention and admitted with SBO.  CT showed high-grade SBO with transition point in the pelvis to the right of the midline and mild mesenteric edema.  She failed conservative management with NG tube decompression.  Repeat CT on 9/21 with ongoing SBO .  Eventually, she underwent diagnostic laparoscopy with LOA and repair of prior small bowel enterotomy on 9/23.  Hospital course complicated by postop ileus requiring NG tube and TPN.  She also had worsening leukocytosis.  Repeat CT abdomen and pelvis on 9/29 showed persistent SBO with transition point in RLQ, new mild regional inflammatory/edematous change as well as scattered peripheral enhancing small fluid collections concerning for colitis.  Patient was started on IV cefepime and Flagyl by general surgery.  Repeat CT abdomen and pelvis on 10/2 with interval resolution of SBO and mild interval decrease in intra-abdominal fluid collection.  IR reengaged and recommended holding off IR drainage since collection is smaller and patient is improving.  Patient pulled out NG tube the night of 10/2.  Had a bowel movement.  No nausea or vomiting.  Advanced to full liquid diet.  Also on TPN.  Calorie count started on 10/5.  General surgery following.  Subjective: Seen and examined earlier this afternoon.  No major events overnight or this morning.  No complaints.  She denies headache, neck pain or abdominal pain.  Denies nausea or vomiting.  Objective: Vitals:   10/18/23 1957 10/19/23 0420 10/19/23 0500 10/19/23 1446  BP: 132/61 116/60  130/62  Pulse:  62 61  (!) 56  Resp: 15 16  20   Temp: 98 F (36.7 C) 98 F (36.7 C)  97.7 F (36.5 C)  TempSrc: Oral   Oral  SpO2: 99% 100%  100%  Weight:   53.1 kg   Height:        Examination:  GENERAL: No apparent distress.  Nontoxic. HEENT: MMM.  Vision and hearing grossly intact.  NECK: Supple.  No apparent JVD.  RESP:  No IWOB.  Fair aeration bilaterally. CVS:  RRR. Heart sounds normal.  ABD/GI/GU: BS+. Abd soft, NTND.  MSK/EXT:   No apparent deformity. Moves extremities. No edema.  SKIN: no apparent skin lesion or wound NEURO: Awake and alert. Oriented appropriately.  No apparent focal neuro deficit. PSYCH: Calm. Normal affect.   Consultants:  General Surgery  Procedures: 9/23-diagnostic laparoscopy, LOA and repair of prior small bowel enterotomy   Microbiology summarized: 9/20-MRSA PCR screen nonreactive 9/20-blood cultures negative. 9/25-blood cultures negative. 9/30-urine culture with pansensitive Proteus mirabilis except to Macrobid  Assessment and plan: Small bowel obstruction: Presents with abdominal pain and distention.  CT confirmed high-grade SBO with transition point in RLQ.  Failed conservative management with NG tube decompression.  Repeat CT on 9/21 with persistent SBO.  Underwent diagnostic laparoscopy, LOA and repair of prior small bowel enterotomy on 9/23.  Postop was complicated by prolonged ileus, possible colitis and intra-abdominal abscesses as noted on repeat CT on 9/29.  Repeat CT on 10/2 with resolution of SBO and improved intra-abdominal fluid collection.  NG tube came out on 10/2.  No nausea, vomiting or abdominal pain.  -  General surgery following-FLD and calorie count -Continue IV cefepime and Flagyl-will clarify duration with surgery -Continue TPN given poor p.o. intake. -Mobilize patient   Postoperative blood loss anemia? Noted some fluctuation but seems to be at baseline.  Received IV iron  earlier in the course. Recent Labs    10/12/23 0538  10/13/23 0420 10/13/23 1532 10/14/23 0331 10/14/23 0451 10/15/23 0402 10/16/23 0500 10/17/23 0335 10/18/23 0037 10/19/23 0344  HGB 11.1* 9.7* 9.3* 8.7* 9.0* 8.1* 8.1* 8.1* 7.8* 7.6*  - Continue monitoring - Continue IV Protonix  - Monitor H&H.  Paroxysmal A-fib with RVR: Likely in the setting of #1.  Required IV amiodarone  at 1 point. -Cardiology recs: resuming p.o. amiodarone  100 mg daily when able to take p.o.-resumed on 10/3 - On IV heparin  for anticoagulation - Cardiology signed off - Optimize electrolytes  Possible postoperative ileus: Seems to have resolved. -Management as above. -Mobilize patient  Hypotension: Normotensive  - Continue monitoring  Dementia without behavioral disturbance: Oriented x 4 except date. - Reorientation and delirium precaution.  Hypokalemia/hypophosphatemia - Pharmacy replenishing with TPN.   Chronic neck pain/cervicalgia: Chronic.  Rates her pain 10/10 but she is not a reliable historian.  -Continue pain meds, muscle relaxers and Voltaren  gel. - OOB  Concern for otitis externa -Ciprodex  otic 4 drops to the left ear twice daily x 5 days.   Acute urinary retention -Discontinue Foley catheter -Bladder scan every 6 hours  UTI?  UA concerning.  Urine culture with Proteus mirabilis. - Already on antibiotics   Severe malnutrition Body mass index is 21.41 kg/m. Nutrition Problem: Severe Malnutrition Etiology: chronic illness Signs/Symptoms: severe fat depletion, severe muscle depletion, percent weight loss (11% in 6 months) Percent weight loss: 11 % (in 6 months) Interventions: Refer to RD note for recommendations   DVT prophylaxis:  Place and maintain sequential compression device Start: 10/13/23 0945 SCDs Start: 10/01/23 2349  Code Status: DNR. Family Communication: None at bedside Level of care: Telemetry Status is: Inpatient Remains inpatient appropriate because: SBO, postop ileus, intra-abdominal infection   Final  disposition: SNF   35 minutes with more than 50% spent in reviewing records, counseling patient/family and coordinating care.   Sch Meds:  Scheduled Meds:  acetaminophen   650 mg Oral Once   amiodarone   100 mg Oral Daily   Chlorhexidine  Gluconate Cloth  6 each Topical QHS   cycloSPORINE   1 drop Both Eyes BID   latanoprost   1 drop Both Eyes QHS   lidocaine   1 patch Transdermal Q24H   pantoprazole  (PROTONIX ) IV  80 mg Intravenous Q12H   sodium bicarbonate  650 mg Oral TID   sodium chloride  flush  10-40 mL Intracatheter Q12H   timolol   1 drop Both Eyes Daily   Continuous Infusions:  ceFEPime (MAXIPIME) IV 2 g (10/19/23 0938)   heparin  900 Units/hr (10/19/23 9361)   metronidazole 500 mg (10/19/23 1303)   TPN ADULT (ION) 65 mL/hr at 10/18/23 1736   TPN ADULT (ION)     PRN Meds:.artificial tears, LORazepam , methocarbamol  (ROBAXIN ) injection, morphine  injection, Muscle Rub, ondansetron  **OR** ondansetron  (ZOFRAN ) IV, mouth rinse, sodium chloride  flush  Antimicrobials: Anti-infectives (From admission, onward)    Start     Dose/Rate Route Frequency Ordered Stop   10/14/23 0930  ceFEPIme (MAXIPIME) 2 g in sodium chloride  0.9 % 100 mL IVPB        2 g 200 mL/hr over 30 Minutes Intravenous Every 12 hours 10/14/23 0820     10/14/23 0900  metroNIDAZOLE (FLAGYL) IVPB 500 mg  500 mg 100 mL/hr over 60 Minutes Intravenous 2 times daily 10/14/23 0811     10/07/23 0915  cefoTEtan  (CEFOTAN ) 2 g in sodium chloride  0.9 % 100 mL IVPB        2 g 200 mL/hr over 30 Minutes Intravenous On call to O.R. 10/07/23 0820 10/07/23 1938        I have personally reviewed the following labs and images: CBC: Recent Labs  Lab 10/15/23 0402 10/16/23 0500 10/17/23 0335 10/18/23 0037 10/19/23 0344  WBC 10.4 10.6* 9.6 8.8 8.2  NEUTROABS 7.9* 8.2* 7.5 6.5 6.0  HGB 8.1* 8.1* 8.1* 7.8* 7.6*  HCT 26.7* 27.3* 27.6* 25.9* 25.2*  MCV 102.3* 102.6* 104.2* 104.4* 103.7*  PLT 256 313 350 365 392   BMP  &GFR Recent Labs  Lab 10/14/23 0451 10/15/23 0402 10/16/23 0500 10/18/23 0037 10/19/23 0344  NA 141 142 143 143 143  K 4.9 4.0 4.1 3.8 3.8  CL 109 113* 116* 120* 119*  CO2 22 19* 16* 16* 16*  GLUCOSE 110* 109* 101* 134* 124*  BUN 51* 52* 51* 40* 37*  CREATININE 0.77 0.71 0.74 0.72 0.62  CALCIUM 9.9 9.6 10.0 9.9 9.9  MG 2.5* 2.4 2.2 2.1 2.1  PHOS 3.7 3.5 3.2 2.9 2.9   Estimated Creatinine Clearance: 42.9 mL/min (by C-G formula based on SCr of 0.62 mg/dL). Liver & Pancreas: Recent Labs  Lab 10/13/23 0420 10/16/23 0500 10/19/23 0344  AST 14* 17 14*  ALT 11 18 10   ALKPHOS 144* 190* 118  BILITOT 0.2 0.3 <0.2  PROT 5.8* 5.8* 5.3*  ALBUMIN  3.3* 3.0* 2.8*   No results for input(s): LIPASE, AMYLASE in the last 168 hours. No results for input(s): AMMONIA in the last 168 hours. Diabetic: No results for input(s): HGBA1C in the last 72 hours. Recent Labs  Lab 10/14/23 2352 10/15/23 0757 10/15/23 1631 10/15/23 2344 10/16/23 0732  GLUCAP 129* 111* 107* 121* 116*   Cardiac Enzymes: No results for input(s): CKTOTAL, CKMB, CKMBINDEX, TROPONINI in the last 168 hours. Recent Labs    10/04/23 1310  PROBNP 529.0*   Coagulation Profile: Recent Labs  Lab 10/17/23 0335  INR 1.2   Thyroid  Function Tests: No results for input(s): TSH, T4TOTAL, FREET4, T3FREE, THYROIDAB in the last 72 hours. Lipid Profile: No results for input(s): CHOL, HDL, LDLCALC, TRIG, CHOLHDL, LDLDIRECT in the last 72 hours.  Anemia Panel: No results for input(s): VITAMINB12, FOLATE, FERRITIN, TIBC, IRON , RETICCTPCT in the last 72 hours. Urine analysis:    Component Value Date/Time   COLORURINE YELLOW 10/14/2023 1049   APPEARANCEUR HAZY (A) 10/14/2023 1049   LABSPEC 1.017 10/14/2023 1049   PHURINE 9.0 (H) 10/14/2023 1049   GLUCOSEU NEGATIVE 10/14/2023 1049   HGBUR NEGATIVE 10/14/2023 1049   BILIRUBINUR NEGATIVE 10/14/2023 1049   KETONESUR NEGATIVE  10/14/2023 1049   PROTEINUR 30 (A) 10/14/2023 1049   NITRITE NEGATIVE 10/14/2023 1049   LEUKOCYTESUR SMALL (A) 10/14/2023 1049   Sepsis Labs: Invalid input(s): PROCALCITONIN, LACTICIDVEN  Microbiology: Recent Results (from the past 240 hours)  Urine Culture (for pregnant, neutropenic or urologic patients or patients with an indwelling urinary catheter)     Status: Abnormal   Collection Time: 10/14/23 10:49 AM   Specimen: Urine, Clean Catch  Result Value Ref Range Status   Specimen Description   Final    URINE, CLEAN CATCH Performed at [facility], [address] [place], [place] [address]    Special Requests   Final    NONE  Performed at [facility], [address] [place], [place] [address]    Culture (A)  Final    >=100,000 COLONIES/mL PROTEUS MIRABILIS Two isolates with different morphologies were identified as the same organism.The most resistant organism was reported. Performed at [facility], [address], [place], [place] [address]    Report Status 10/17/2023 FINAL  Final   Organism ID, Bacteria PROTEUS MIRABILIS (A)  Final      Susceptibility   Proteus mirabilis - MIC*    AMPICILLIN <=2 SENSITIVE Sensitive     CEFAZOLIN (URINE) Value in next row Sensitive      4 SENSITIVEThis is a modified FDA-approved test that has been validated and its performance characteristics determined by the reporting laboratory.  This laboratory is certified under the Clinical Laboratory Improvement Amendments CLIA as qualified to perform high complexity clinical laboratory testing.    CEFEPIME Value in next row Sensitive      4 SENSITIVEThis is a modified FDA-approved test that has been validated and its performance characteristics determined by the reporting laboratory.  This laboratory is certified under the Clinical Laboratory Improvement Amendments CLIA as qualified to perform high complexity clinical laboratory testing.    ERTAPENEM  Value in next row Sensitive      4 SENSITIVEThis is a modified FDA-approved test that has been validated and its performance characteristics determined by the reporting laboratory.  This laboratory is certified under the Clinical Laboratory Improvement Amendments CLIA as qualified to perform high complexity clinical laboratory testing.    CEFTRIAXONE  Value in next row Sensitive      4 SENSITIVEThis is a modified FDA-approved test that has been validated and its performance characteristics determined by the reporting laboratory.  This laboratory is certified under the Clinical Laboratory Improvement Amendments CLIA as qualified to perform high complexity clinical laboratory testing.    CIPROFLOXACIN  Value in next row Sensitive      4 SENSITIVEThis is a modified FDA-approved test that has been validated and its performance characteristics determined by the reporting laboratory.  This laboratory is certified under the Clinical Laboratory Improvement Amendments CLIA as qualified to perform high complexity clinical laboratory testing.    GENTAMICIN Value in next row Sensitive      4 SENSITIVEThis is a modified FDA-approved test that has been validated and its performance characteristics determined by the reporting laboratory.  This laboratory is certified under the Clinical Laboratory Improvement Amendments CLIA as qualified to perform high complexity clinical laboratory testing.    NITROFURANTOIN Value in next row Resistant      4 SENSITIVEThis is a modified FDA-approved test that has been validated and its performance characteristics determined by the reporting laboratory.  This laboratory is certified under the Clinical Laboratory Improvement Amendments CLIA as qualified to perform high complexity clinical laboratory testing.    TRIMETH/SULFA Value in next row Sensitive      4 SENSITIVEThis is a modified FDA-approved test that has been validated and its performance characteristics determined by the  reporting laboratory.  This laboratory is certified under the Clinical Laboratory Improvement Amendments CLIA as qualified to perform high complexity clinical laboratory testing.    AMPICILLIN/SULBACTAM Value in next row Sensitive      4 SENSITIVEThis is a modified FDA-approved test that has been validated and its performance characteristics determined by the reporting laboratory.  This laboratory is certified under the Clinical Laboratory Improvement Amendments CLIA as qualified to perform high complexity clinical laboratory testing.    PIP/TAZO Value in next  row Sensitive      <=4 SENSITIVEThis is a modified FDA-approved test that has been validated and its performance characteristics determined by the reporting laboratory.  This laboratory is certified under the Clinical Laboratory Improvement Amendments CLIA as qualified to perform high complexity clinical laboratory testing.    MEROPENEM Value in next row Sensitive      <=4 SENSITIVEThis is a modified FDA-approved test that has been validated and its performance characteristics determined by the reporting laboratory.  This laboratory is certified under the Clinical Laboratory Improvement Amendments CLIA as qualified to perform high complexity clinical laboratory testing.    * >=100,000 COLONIES/mL PROTEUS MIRABILIS    Radiology Studies: No results found.    Amandalynn Pitz T. Nala Kachel [facility]  If 7PM-7AM, please contact night-coverage [email or web address] 10/19/2023, 4:01 PM

## 2023-10-19 NOTE — Progress Notes (Signed)
 PHARMACY - TOTAL PARENTERAL NUTRITION CONSULT NOTE   Indication: Small bowel obstruction and likely post-op ileus  Patient Measurements: Height: 5' 2 (157.5 cm) Weight: 53.1 kg (117 lb 1 oz) IBW/kg (Calculated) : 50.1 TPN AdjBW (KG): 50.2 Body mass index is 21.41 kg/m.  Assessment:  83 YO female presenting 9/17 with abdominal pain and distension. Initial CT A/P showed high-grade small bowel obstruction with transition point in the pelvis to the right of the midline and mild mesenteric edema; repeat CT continued to show ongoing dilated small bowel loops. Patient taken to OR 9/23 for diagnostic laparoscopy with LOA and repair of prior small bowel enterotomy. NGT in place. Pharmacy consulted for TPN management in the setting of SBO.   Glucose / Insulin : No Hx DM - CBGs well controlled (goal 100-150) on goal rate TPN. CBG/s and SSI stopped 10/2 Electrolytes: Cl elevated at 119, Bicarb low at 16 (acetate maxed in TPN, also on Na bicarb 650 mg PO TID) , CorrCa elevated at 10.86 (none in TPN), K 3.8; other lytes wnl - goal for ileus and afib: K >4 and Mag >2 Renal: BUN 40  elevated; SCr stable WNL <1 Hepatic: : albumin  2.8 down;  AST low, Tbili <0.2 - TG 199 elevated (9/29 labs) I/O: +500 mL - UOP: 2100 mL - mIVF: none currently - LBM: 10/4 GI Imaging: - 9/17 a/p: High-grade small bowel obstruction with transition point in the pelvis to the right of midline - 9/19 DG abd: SBO pattern with no improvement since the prior CT - 9/21 CT chest/a/p: Small bowel loops remain dilated in the abdomen and pelvis. There is some diluted contrast in the dilated small bowel loops, but no discernible contrast in the colonic lumen. The degree of small-bowel dilatation appears stable to mildly progressive in the interval - 9/23 AXR: persistent SBO with mild improvement - 9/24 AXR: some decompression of SB in upper abdomen noted - 9/29 CT a/p: persistent SBO with transition point in RLQ, signs of colitis and  basilar atelectasis.  - 10/2 abd CT: nterval resolution of previously seen bowel obstruction.  wall collection - postoperative seroma or developing abscess?  GI Surgeries / Procedures:  - 9/23: Exploratory laparotomy with lysis of adhesions, primary repair of small bowel enterotomy  Central access: PICC obtained 9/23 TPN start date: 9/24  Nutritional Goals: Goal TPN rate is 65 mL/hr (provides 89 g of protein and 1619 kcals per day)  - 10/3: *Noted TPN tubing broken. TPN off with D10W since early this AM infusing - 10/4: on full liquid diet  RD Assessment: Estimated Needs Total Energy Estimated Needs: 1500-1700 kcals Total Protein Estimated Needs: 75-90 grams Total Fluid Estimated Needs: >/= 1.5L  Current Nutrition:  NPO and TPN  Plan:   Today, at 1800: Continue TPN at goal rate of 65 mL/hr Electrolytes in TPN:  Na 20 mEq/L Increase K to 40 mEq/L Ca 0 mEq/L Mg 0 mEq/L Phos 20 mmol/L Cl:Ac max Ac  Add standard MVI and trace elements to TPN 10/2: D/c SSI and CBGs Further mIVF per MD (none currently) Monitor TPN labs on Mon/Thurs, and PRN   Iantha Batch, PharmD, BCPS 10/19/2023 8:04 AM

## 2023-10-19 NOTE — Progress Notes (Signed)
 PHARMACY - ANTICOAGULATION CONSULT NOTE  Pharmacy Consult for heparin   Indication: atrial fibrillation  Allergies  Allergen Reactions   Lisinopril Cough   Penicillins Itching and Other (See Comments)    50 years ago   Adhesive [Tape] Itching   Atorvastatin Itching   Dilaudid  [Hydromorphone  Hcl] Itching    Patient Measurements: Height: 5' 2 (157.5 cm) Weight: 53.1 kg (117 lb 1 oz) IBW/kg (Calculated) : 50.1 HEPARIN  DW (KG): 50.2  Vital Signs: Temp: 98 F (36.7 C) (10/05 0420) BP: 116/60 (10/05 0420) Pulse Rate: 61 (10/05 0420)  Labs: Recent Labs    10/17/23 0335 10/18/23 0037 10/19/23 0344  HGB 8.1* 7.8* 7.6*  HCT 27.6* 25.9* 25.2*  PLT 350 365 392  LABPROT 15.6*  --   --   INR 1.2  --   --   HEPARINUNFRC 0.58 0.49 0.52  CREATININE  --  0.72 0.62    Estimated Creatinine Clearance: 42.9 mL/min (by C-G formula based on SCr of 0.62 mg/dL).   Medical History: 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, possible    Tremors possible parkinsons   Manic disorder, single episode, in full remission 12/01/2009   Sixth nerve palsy      Assessment: Patient is an 83 y.o F who presented the ED on 10/01/23 with c/o constipation, abdominal pain and n/v.  She was subsequently found to have high-grade SBO and underwent exploratory laparotomy with lysis of adhesions and repair of small bowel enterotomy on 10/07/23.  She is  currently on heparin  drip for afib.  Today, 10/19/2023: - heparin  level is therapeutic at 0.52 - hgb down slightly to 7.6, plts 392K - no bleeding documented  Goal of Therapy:  Heparin  level 0.3-0.7 units/ml Monitor platelets by anticoagulation protocol: Yes   Plan:  - continue heparin  drip at 900 units/hr - daily heparin  level and cbc - monitor for s/sx bleeding   Fardeen Steinberger P 10/19/2023,8:01 AM

## 2023-10-19 NOTE — Progress Notes (Signed)
 12 Days Post-Op   Subjective/Chief Complaint: Not eating much Denies nausea Nurse at bedside He states that she drank 1 breeze yesterday but is not really eating much solid food  Objective: Vital signs in last 24 hours: Temp:  [97.9 F (36.6 C)-98 F (36.7 C)] 98 F (36.7 C) (10/05 0420) Pulse Rate:  [53-62] 61 (10/05 0420) Resp:  [14-16] 16 (10/05 0420) BP: (116-132)/(60-73) 116/60 (10/05 0420) SpO2:  [99 %-100 %] 100 % (10/05 0420) Weight:  [53.1 kg] 53.1 kg (10/05 0500) Last BM Date : 10/18/23  Intake/Output from previous day: 10/04 0701 - 10/05 0700 In: 2171 [I.V.:1771; IV Piggyback:400] Out: 2100 [Urine:2100] Intake/Output this shift: No intake/output data recorded.  Exam: Awake and alert Laying on side secondary to headache Abdomen soft, non-distended Incision clean  Lab Results:  Recent Labs    10/18/23 0037 10/19/23 0344  WBC 8.8 8.2  HGB 7.8* 7.6*  HCT 25.9* 25.2*  PLT 365 392   BMET Recent Labs    10/18/23 0037 10/19/23 0344  NA 143 143  K 3.8 3.8  CL 120* 119*  CO2 16* 16*  GLUCOSE 134* 124*  BUN 40* 37*  CREATININE 0.72 0.62  CALCIUM 9.9 9.9   PT/INR Recent Labs    10/17/23 0335  LABPROT 15.6*  INR 1.2   ABG No results for input(s): PHART, HCO3 in the last 72 hours.  Invalid input(s): PCO2, PO2  Studies/Results: No results found.   Anti-infectives: Anti-infectives (From admission, onward)    Start     Dose/Rate Route Frequency Ordered Stop   10/14/23 0930  ceFEPIme (MAXIPIME) 2 g in sodium chloride  0.9 % 100 mL IVPB        2 g 200 mL/hr over 30 Minutes Intravenous Every 12 hours 10/14/23 0820     10/14/23 0900  metroNIDAZOLE (FLAGYL) IVPB 500 mg        500 mg 100 mL/hr over 60 Minutes Intravenous 2 times daily 10/14/23 0811     10/07/23 0915  cefoTEtan  (CEFOTAN ) 2 g in sodium chloride  0.9 % 100 mL IVPB        2 g 200 mL/hr over 30 Minutes Intravenous On call to O.R. 10/07/23 0820 10/07/23 1938        Assessment/Plan: SBO POD  s/p dx lap converted to ex lap with LOA and repair of enterotomy, Dr. Dasie 9/23 - CT AP 9/29 reviewed independently and with IR - pelvic collection does appear to be developing and seems to communicate with other small collections but is too early to drain - Vitals stable. Afebrile. - WBC  8.8, Hgb 7.8 (stable) - CT repeated 10/2 showed intra-abdominal collection is smaller, but it does appear more mature/thick-walled. Clinically the patient has a resolving ileus and her leukocytosis has resolved. Discussed with IR, will hold off on drainage since collection is smaller and patient improving, if there is a clinical change they can re-consider drainage. - Continue TPN - Start calorie count  - Per TRH - Dementia  GERD Tremors  HTN  Anxiety  A fib RVR - on amio gtt, heparin  gtt  Anemia   -on fulls but not taking in much; start calorie count, continue TPN -continuing current care  Camellia Blush 10/19/2023

## 2023-10-20 DIAGNOSIS — D62 Acute posthemorrhagic anemia: Secondary | ICD-10-CM | POA: Diagnosis not present

## 2023-10-20 DIAGNOSIS — K56609 Unspecified intestinal obstruction, unspecified as to partial versus complete obstruction: Secondary | ICD-10-CM | POA: Diagnosis not present

## 2023-10-20 DIAGNOSIS — F0390 Unspecified dementia without behavioral disturbance: Secondary | ICD-10-CM | POA: Diagnosis not present

## 2023-10-20 DIAGNOSIS — H9193 Unspecified hearing loss, bilateral: Secondary | ICD-10-CM | POA: Diagnosis not present

## 2023-10-20 LAB — COMPREHENSIVE METABOLIC PANEL WITH GFR
ALT: 9 U/L (ref 0–44)
AST: 13 U/L — ABNORMAL LOW (ref 15–41)
Albumin: 2.6 g/dL — ABNORMAL LOW (ref 3.5–5.0)
Alkaline Phosphatase: 100 U/L (ref 38–126)
Anion gap: 6 (ref 5–15)
BUN: 40 mg/dL — ABNORMAL HIGH (ref 8–23)
CO2: 16 mmol/L — ABNORMAL LOW (ref 22–32)
Calcium: 9.6 mg/dL (ref 8.9–10.3)
Chloride: 115 mmol/L — ABNORMAL HIGH (ref 98–111)
Creatinine, Ser: 0.64 mg/dL (ref 0.44–1.00)
GFR, Estimated: >60 mL/min (ref >60)
Glucose, Bld: 123 mg/dL — ABNORMAL HIGH (ref 70–99)
Potassium: 4 mmol/L (ref 3.5–5.1)
Sodium: 137 mmol/L (ref 135–145)
Total Bilirubin: 0.2 mg/dL (ref 0.0–1.2)
Total Protein: 4.8 g/dL — ABNORMAL LOW (ref 6.5–8.1)

## 2023-10-20 LAB — CBC WITH DIFFERENTIAL/PLATELET
Abs Immature Granulocytes: 0.24 K/uL — ABNORMAL HIGH (ref 0.00–0.07)
Basophils Absolute: 0.1 K/uL (ref 0.0–0.1)
Basophils Relative: 1 %
Eosinophils Absolute: 0.1 K/uL (ref 0.0–0.5)
Eosinophils Relative: 1 %
HCT: 23.6 % — ABNORMAL LOW (ref 36.0–46.0)
Hemoglobin: 7.1 g/dL — ABNORMAL LOW (ref 12.0–15.0)
Immature Granulocytes: 3 %
Lymphocytes Relative: 17 %
Lymphs Abs: 1.2 K/uL (ref 0.7–4.0)
MCH: 30.6 pg (ref 26.0–34.0)
MCHC: 30.1 g/dL (ref 30.0–36.0)
MCV: 101.7 fL — ABNORMAL HIGH (ref 80.0–100.0)
Monocytes Absolute: 0.4 K/uL (ref 0.1–1.0)
Monocytes Relative: 6 %
Neutro Abs: 5.1 K/uL (ref 1.7–7.7)
Neutrophils Relative %: 72 %
Platelets: 402 K/uL — ABNORMAL HIGH (ref 150–400)
RBC: 2.32 MIL/uL — ABNORMAL LOW (ref 3.87–5.11)
RDW: 15.9 % — ABNORMAL HIGH (ref 11.5–15.5)
WBC: 7.1 K/uL (ref 4.0–10.5)
nRBC: 0 % (ref 0.0–0.2)

## 2023-10-20 LAB — PHOSPHORUS: Phosphorus: 2.8 mg/dL (ref 2.5–4.6)

## 2023-10-20 LAB — RETICULOCYTES
Immature Retic Fract: 31.4 % — ABNORMAL HIGH (ref 2.3–15.9)
RBC.: 2.35 MIL/uL — ABNORMAL LOW (ref 3.87–5.11)
Retic Count, Absolute: 96.1 K/uL (ref 19.0–186.0)
Retic Ct Pct: 4.1 % — ABNORMAL HIGH (ref 0.4–3.1)

## 2023-10-20 LAB — TRIGLYCERIDES: Triglycerides: 65 mg/dL (ref <150)

## 2023-10-20 LAB — IRON AND TIBC
Iron: 49 ug/dL (ref 28–170)
Saturation Ratios: 22 % (ref 10.4–31.8)
TIBC: 225 ug/dL — ABNORMAL LOW (ref 250–450)
UIBC: 177 ug/dL

## 2023-10-20 LAB — FERRITIN: Ferritin: 511 ng/mL — ABNORMAL HIGH (ref 11–307)

## 2023-10-20 LAB — FOLATE: Folate: 12.9 ng/mL (ref >5.9)

## 2023-10-20 LAB — MAGNESIUM: Magnesium: 1.8 mg/dL (ref 1.7–2.4)

## 2023-10-20 LAB — VITAMIN B12: Vitamin B-12: 931 pg/mL — ABNORMAL HIGH (ref 180–914)

## 2023-10-20 LAB — HEPARIN LEVEL (UNFRACTIONATED): Heparin Unfractionated: 0.46 [IU]/mL (ref 0.30–0.70)

## 2023-10-20 MED ORDER — TRAVASOL 10 % IV SOLN
INTRAVENOUS | Status: AC
  Filled 2023-10-20: qty 889.2

## 2023-10-20 MED ORDER — BOOST / RESOURCE BREEZE PO LIQD CUSTOM
1.0000 | Freq: Three times a day (TID) | ORAL | Status: DC
  Administered 2023-10-20 – 2023-10-27 (×15): 1 via ORAL

## 2023-10-20 MED ORDER — ACETAMINOPHEN 325 MG PO TABS
650.0000 mg | ORAL_TABLET | Freq: Four times a day (QID) | ORAL | Status: DC | PRN
  Administered 2023-10-20 – 2023-10-25 (×7): 650 mg via ORAL
  Filled 2023-10-20 (×8): qty 2

## 2023-10-20 MED ORDER — ENSURE PLUS HIGH PROTEIN PO LIQD: 237.0000 mL | Freq: Two times a day (BID) | ORAL | Status: DC

## 2023-10-20 NOTE — Progress Notes (Signed)
 Nutrition Follow-up  DOCUMENTATION CODES:   Severe malnutrition in context of chronic illness  INTERVENTION:   -Calorie Count  - Please document % intake of all foods, drinks, and nutrition supplements patient consumes on meal tickets and place in envelope on patient's door.  - If patient skips/refuses meals please document 0% for that meal.   -TPN management per Pharmacy -Daily weights while on TPN  -Ensure Plus High Protein po TID, each supplement provides 350 kcal and 20 grams of protein.   -Boost Breeze po TID with meals, each supplement provides 250 kcal and 9 grams of protein   -Magic cup BID with meals, each supplement provides 290 kcal and 9 grams of protein   NUTRITION DIAGNOSIS:   Severe Malnutrition related to chronic illness as evidenced by severe fat depletion, severe muscle depletion, percent weight loss (11% in 6 months).  Ongoing.  GOAL:   Patient will meet greater than or equal to 90% of their needs  Meeting with TPN  MONITOR:   PO intake, Supplement acceptance TPN  ASSESSMENT:   83 y.o. female with PMH significant for dementia, cognitive impairment, bipolar disorder, possible Parkinsons disease and h/o rection of GIST tumor who presented with complaints of abdominal pain nausea vomiting and a lot of abdominal distention. Admitted for small bowel obstruction.  9/17 Admit; NPO; NGT placed 9/23 s/p ex-lap with lysis of adhesions, repair of small bowel enterotomy  9/24 TPN initiated 9/26 TPN increased to goal 9/29 CT A/P showing persistent SBO; Surgery feels patient has post-op ileus 10/5: Calorie count ordered  Calorie count results 10/5: B: 180 kcals, 6g protein L: nothing documented D: nothing documented Total: 180 kcals, 6g protein  For breakfast today 10/6: 160 kcals, 8g protein  Patient unlikely to meet needs with full liquids without supplements. Will order Ensure, Boost Breeze and Magic cups to maximize nutrition options.  Patient  remains on goal TPN meeting 100% of needs.  TPN to continue at goal rate of 65 ml/hr, providing 1619 kcals and 88g protein.   Admission weight: 110 lbs Current weight: 121 lbs  Medications reviewed.  Labs reviewed: TG 65   Diet Order:   Diet Order             Diet full liquid Room service appropriate? Yes; Fluid consistency: Thin  Diet effective now                   EDUCATION NEEDS:   No education needs have been identified at this time  Skin:  Skin Assessment: Skin Integrity Issues: Skin Integrity Issues:: Incisions Incisions: Abdomen  Last BM:  10/4 -type 4  Height:   Ht Readings from Last 1 Encounters:  10/07/23 5' 2 (1.575 m)    Weight:   Wt Readings from Last 1 Encounters:  10/20/23 55.1 kg    BMI:  Body mass index is 22.22 kg/m.  Estimated Nutritional Needs:   Kcal:  1500-1700 kcals  Protein:  75-90 grams  Fluid:  >/= 1.5L   Morna Lee, MS, RD, LDN Inpatient Clinical Dietitian Contact via Secure chat

## 2023-10-20 NOTE — TOC Progression Note (Addendum)
 Transition of Care [facility]) - Progression Note   Patient Details  Name: Tracey [name] MRN: [id number] Date of Birth: December 12, 1940  [facility] CM/SW Contact  Duwaine GORMAN Aran, LCSW Phone Number: 10/20/2023, 2:11 PM  Clinical Narrative: Patient remains on IV heparin  and TPN, so patient is not medically ready to go to [facility] SNF. Care management following.  Expected Discharge Plan: Skilled Nursing Facility Barriers to Discharge: Continued Medical Work up  Expected Discharge Plan and Services Discharge Planning Services: CM Consult Post Acute Care Choice: Skilled Nursing Facility Living arrangements for the past 2 months: Assisted Living Facility  Social Drivers of Health (SDOH) Interventions SDOH Screenings   Food Insecurity: No Food Insecurity (10/02/2023)  Housing: Low Risk  (10/02/2023)  Transportation Needs: No Transportation Needs (10/02/2023)  Utilities: Not At Risk (10/02/2023)  Depression (PHQ2-9): Low Risk  (10/02/2023)  Social Connections: Moderately Integrated (10/02/2023)  Tobacco Use: Low Risk  (10/16/2023)   Readmission Risk Interventions     No data to display

## 2023-10-20 NOTE — Progress Notes (Signed)
 13 Days Post-Op   Subjective/Chief Complaint: No complaints   Objective: Vital signs in last 24 hours: Temp:  [97.7 F (36.5 C)-98.7 F (37.1 C)] 98.6 F (37 C) (10/06 0409) Pulse Rate:  [54-60] 60 (10/06 0820) Resp:  [18-20] 18 (10/06 0820) BP: (110-153)/(45-66) 153/66 (10/06 0820) SpO2:  [98 %-100 %] 100 % (10/06 0820) Weight:  [55.1 kg] 55.1 kg (10/06 0500) Last BM Date : 10/18/23  Intake/Output from previous day: 10/05 0701 - 10/06 0700 In: 1508.5 [P.O.:240; I.V.:868.5; IV Piggyback:400] Out: 1100 [Urine:1100] Intake/Output this shift: Total I/O In: -  Out: 650 [Urine:650]  General appearance: alert and cooperative Resp: clear to auscultation bilaterally Cardio: regular rate and rhythm GI: soft, nontender  Lab Results:  Recent Labs    10/19/23 0344 10/20/23 0308  WBC 8.2 7.1  HGB 7.6* 7.1*  HCT 25.2* 23.6*  PLT 392 402*   BMET Recent Labs    10/19/23 0344 10/20/23 0308  NA 143 137  K 3.8 4.0  CL 119* 115*  CO2 16* 16*  GLUCOSE 124* 123*  BUN 37* 40*  CREATININE 0.62 0.64  CALCIUM 9.9 9.6   PT/INR No results for input(s): LABPROT, INR in the last 72 hours. ABG No results for input(s): PHART, HCO3 in the last 72 hours.  Invalid input(s): PCO2, PO2  Studies/Results: No results found.  Anti-infectives: Anti-infectives (From admission, onward)    Start     Dose/Rate Route Frequency Ordered Stop   10/14/23 0930  ceFEPIme (MAXIPIME) 2 g in sodium chloride  0.9 % 100 mL IVPB        2 g 200 mL/hr over 30 Minutes Intravenous Every 12 hours 10/14/23 0820     10/14/23 0900  metroNIDAZOLE (FLAGYL) IVPB 500 mg        500 mg 100 mL/hr over 60 Minutes Intravenous 2 times daily 10/14/23 0811     10/07/23 0915  cefoTEtan  (CEFOTAN ) 2 g in sodium chloride  0.9 % 100 mL IVPB        2 g 200 mL/hr over 30 Minutes Intravenous On call to O.R. 10/07/23 0820 10/07/23 1938       Assessment/Plan: s/p Procedure(s): DIAGNOSTIC LAPAROSCOPY,  EXPLORATORY LAPAROTOMY WITH LYSIS OF ADHESIONS, REPAIR OF SMALL BOWEL ENTEROTOMY (N/A) LYSIS, ADHESIONS, LAPAROSCOPIC (N/A) Advance diet SBO POD  s/p dx lap converted to ex lap with LOA and repair of enterotomy, Dr. Dasie 9/23 - CT AP 9/29 reviewed independently and with IR - pelvic collection does appear to be developing and seems to communicate with other small collections but is too early to drain - Vitals stable. Afebrile. - WBC  8.8, Hgb 7.8 (stable) - CT repeated 10/2 showed intra-abdominal collection is smaller, but it does appear more mature/thick-walled. Clinically the patient has a resolving ileus and her leukocytosis has resolved. Discussed with IR, will hold off on drainage since collection is smaller and patient improving, if there is a clinical change they can re-consider drainage. - Continue TPN - Start calorie count   - Per TRH - Dementia  GERD Tremors  HTN  Anxiety  A fib RVR - on amio gtt, heparin  gtt  Anemia     -on fulls but not taking in much; start calorie count, continue TPN -continuing current care  LOS: 19 days    Deward Null III 10/20/2023

## 2023-10-20 NOTE — Progress Notes (Signed)
 PHARMACY - ANTICOAGULATION CONSULT NOTE  Pharmacy Consult for Heparin   Indication: atrial fibrillation  Allergies  Allergen Reactions   Lisinopril Cough   Penicillins Itching and Other (See Comments)    50 years ago   Adhesive [Tape] Itching   Atorvastatin Itching   Dilaudid  [Hydromorphone  Hcl] Itching    Patient Measurements: Height: 5' 2 (157.5 cm) Weight: 55.1 kg (121 lb 7.6 oz) IBW/kg (Calculated) : 50.1 HEPARIN  DW (KG): 50.2  Vital Signs: Temp: 98.6 F (37 C) (10/06 0409) Temp Source: Oral (10/06 0409) BP: 153/66 (10/06 0820) Pulse Rate: 60 (10/06 0820)  Labs: Recent Labs    10/18/23 0037 10/19/23 0344 10/20/23 0308  HGB 7.8* 7.6* 7.1*  HCT 25.9* 25.2* 23.6*  PLT 365 392 402*  HEPARINUNFRC 0.49 0.52 0.46  CREATININE 0.72 0.62 0.64    Estimated Creatinine Clearance: 42.9 mL/min (by C-G formula based on SCr of 0.64 mg/dL).   Medical History: 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, possible    Tremors possible parkinsons   Manic disorder, single episode, in full remission 12/01/2009   Sixth nerve palsy     Assessment:  AC/Heme: UFH per Rx for new Afib. CHADS2VASC: 4.   - Hep level: 0.46, INR 1.2, Hgb 7.1 dropping, Plts 204 rising.  Goal of Therapy:  Heparin  level 0.3-0.7 units/ml Monitor platelets by anticoagulation protocol: Yes   Plan:  Con't IV heparin  at 900 units/hr Daily HL and  CBC   Dmarion Perfect Karoline Marina, PharmD, BCPS Clinical Staff Pharmacist Marina Salines Stillinger 10/20/2023,9:22 AM

## 2023-10-20 NOTE — Progress Notes (Signed)
 PROGRESS NOTE  Tracey [name] FMW:[id number] DOB: 06/14/1940   PCP: Mast, Man X, NP  Patient is from: Home.  DOA: 10/01/2023 LOS: 19  Chief complaints Chief Complaint  Patient presents with   Constipation     Brief Narrative / Interim history: 83 year old F with PMH of cognitive impairment, possible Parkinson's disease, GIST, bipolar disorder and chronic neck pain presenting with abdominal pain and distention and admitted with SBO.  CT showed high-grade SBO with transition point in the pelvis to the right of the midline and mild mesenteric edema.  She failed conservative management with NG tube decompression.  Repeat CT on 9/21 with ongoing SBO .  Eventually, she underwent diagnostic laparoscopy with LOA and repair of prior small bowel enterotomy on 9/23.  Hospital course complicated by postop ileus requiring NG tube and TPN.  She also had worsening leukocytosis.  Repeat CT abdomen and pelvis on 9/29 showed persistent SBO with transition point in RLQ, new mild regional inflammatory/edematous change as well as scattered peripheral enhancing small fluid collections concerning for colitis.  Patient was started on IV cefepime and Flagyl by general surgery.  Repeat CT abdomen and pelvis on 10/2 with interval resolution of SBO and mild interval decrease in intra-abdominal fluid collection.  IR reengaged and recommended holding off IR drainage since collection is smaller and patient is improving.  Patient pulled out NG tube the night of 10/2.  Had a bowel movement.  No nausea or vomiting.  Advanced to full liquid diet.  Also on TPN.  Calorie count started on 10/5.  General surgery following.  Subjective: Seen and examined earlier this afternoon.  No major events overnight or this morning.  No complaints.  Poor p.o. intake.  She reports some juice and few spoons of grit this morning.  Denies nausea or vomiting.  Denies abdominal pain.  Objective: Vitals:   10/19/23 2329 10/20/23 0409 10/20/23  0500 10/20/23 0820  BP:  (!) 110/45  (!) 153/66  Pulse:  (!) 54  60  Resp:    18  Temp: 98.6 F (37 C) 98.6 F (37 C)    TempSrc: Oral Oral    SpO2:  98%  100%  Weight:   55.1 kg   Height:        Examination:  GENERAL: No apparent distress.  Nontoxic. HEENT: MMM.  Vision and hearing grossly intact.  NECK: Supple.  No apparent JVD.  RESP:  No IWOB.  Fair aeration bilaterally. CVS:  RRR. Heart sounds normal.  ABD/GI/GU: BS+. Abd soft, NTND.  MSK/EXT:   No apparent deformity. Moves extremities. No edema.  SKIN: no apparent skin lesion or wound NEURO: Awake and alert. Oriented appropriately.  No apparent focal neuro deficit. PSYCH: Calm. Normal affect.   Consultants:  General Surgery  Procedures: 9/23-diagnostic laparoscopy, LOA and repair of prior small bowel enterotomy   Microbiology summarized: 9/20-MRSA PCR screen nonreactive 9/20-blood cultures negative. 9/25-blood cultures negative. 9/30-urine culture with pansensitive Proteus mirabilis except to Macrobid  Assessment and plan: Small bowel obstruction: Presents with abdominal pain and distention.  CT confirmed high-grade SBO with transition point in RLQ.  Failed conservative management with NG tube decompression.  Repeat CT on 9/21 with persistent SBO.  Underwent diagnostic laparoscopy, LOA and repair of prior small bowel enterotomy on 9/23.  Postop was complicated by prolonged ileus, possible colitis and intra-abdominal abscesses as noted on repeat CT on 9/29.  Repeat CT on 10/2 with resolution of SBO and improved intra-abdominal fluid collection.  NG tube came  out on 10/2.  No nausea, vomiting or abdominal pain.  -General surgery following-FLD and calorie count -Started on IV cefepime and Flagyl by surgery on 9/30. -Continue TPN given poor p.o. intake.  Calorie count underway. -Mobilize patient   Postoperative blood loss anemia?  Hgb slowly drifting down.  Received IV iron  during this hospitalization.  No overt  bleeding.  She is on IV heparin  for A-fib. Recent Labs    10/13/23 0420 10/13/23 1532 10/14/23 0331 10/14/23 0451 10/15/23 0402 10/16/23 0500 10/17/23 0335 10/18/23 0037 10/19/23 0344 10/20/23 0308  HGB 9.7* 9.3* 8.7* 9.0* 8.1* 8.1* 8.1* 7.8* 7.6* 7.1*  -Continue monitoring -Check Hemoccult.  If positive, will hold IV heparin  -Continue IV Protonix  -Monitor H&H.  Transfuse for Hgb <7.0. -Recheck anemia panel given poor nutritional status  Paroxysmal A-fib with RVR: Likely in the setting of #1.  Required IV amiodarone  at 1 point. -Cardiology recs: resuming p.o. amiodarone  100 mg daily when able to take p.o.-resumed on 10/3 -On IV heparin  for anticoagulation -Cardiology signed off -Optimize electrolytes  Possible postoperative ileus: Seems to have resolved.  Having BMs. -Management as above. -Mobilize patient  Hypotension: Normotensive for most part. - Continue monitoring  Dementia without behavioral disturbance: Oriented x 4 except date. - Reorientation and delirium precaution.  Hypokalemia/hypophosphatemia - Pharmacy replenishing with TPN.   Chronic neck pain/cervicalgia: Chronic.  Rates her pain 10/10 but she is not a reliable historian.  -Continue pain meds, muscle relaxers and Voltaren  gel. - OOB  Concern for otitis externa -Completed 5 days of Ciprodex  otic    Acute urinary retention: Passed voiding trial on 10/5. - Closely monitor.  UTI?  UA concerning.  Urine culture with Proteus mirabilis. - Already on antibiotics  Severe malnutrition Body mass index is 22.22 kg/m. Nutrition Problem: Severe Malnutrition Etiology: chronic illness Signs/Symptoms: severe fat depletion, severe muscle depletion, percent weight loss (11% in 6 months) Percent weight loss: 11 % (in 6 months) Interventions: Refer to RD note for recommendations   DVT prophylaxis:  Place and maintain sequential compression device Start: 10/13/23 0945 SCDs Start: 10/01/23 2349  Code Status:  DNR. Family Communication: None at bedside Level of care: Telemetry Status is: Inpatient Remains inpatient appropriate because: SBO, postop ileus, intra-abdominal infection   Final disposition: SNF   35 minutes with more than 50% spent in reviewing records, counseling patient/family and coordinating care.   Sch Meds:  Scheduled Meds:  acetaminophen   650 mg Oral Once   amiodarone   100 mg Oral Daily   Chlorhexidine  Gluconate Cloth  6 each Topical QHS   cycloSPORINE   1 drop Both Eyes BID   latanoprost   1 drop Both Eyes QHS   lidocaine   1 patch Transdermal Q24H   pantoprazole  (PROTONIX ) IV  80 mg Intravenous Q12H   sodium bicarbonate  650 mg Oral TID   sodium chloride  flush  10-40 mL Intracatheter Q12H   timolol   1 drop Both Eyes Daily   Continuous Infusions:  ceFEPime (MAXIPIME) IV 2 g (10/20/23 0825)   heparin  900 Units/hr (10/19/23 9361)   metronidazole 500 mg (10/20/23 0927)   TPN ADULT (ION) 65 mL/hr at 10/19/23 1746   TPN ADULT (ION)     PRN Meds:.artificial tears, LORazepam , methocarbamol  (ROBAXIN ) injection, morphine  injection, Muscle Rub, ondansetron  **OR** ondansetron  (ZOFRAN ) IV, mouth rinse, sodium chloride  flush  Antimicrobials: Anti-infectives (From admission, onward)    Start     Dose/Rate Route Frequency Ordered Stop   10/14/23 0930  ceFEPIme (MAXIPIME) 2 g in sodium chloride  0.9 %  100 mL IVPB        2 g 200 mL/hr over 30 Minutes Intravenous Every 12 hours 10/14/23 0820     10/14/23 0900  metroNIDAZOLE (FLAGYL) IVPB 500 mg        500 mg 100 mL/hr over 60 Minutes Intravenous 2 times daily 10/14/23 0811     10/07/23 0915  cefoTEtan  (CEFOTAN ) 2 g in sodium chloride  0.9 % 100 mL IVPB        2 g 200 mL/hr over 30 Minutes Intravenous On call to O.R. 10/07/23 0820 10/07/23 1938        I have personally reviewed the following labs and images: CBC: Recent Labs  Lab 10/16/23 0500 10/17/23 0335 10/18/23 0037 10/19/23 0344 10/20/23 0308  WBC 10.6* 9.6 8.8  8.2 7.1  NEUTROABS 8.2* 7.5 6.5 6.0 5.1  HGB 8.1* 8.1* 7.8* 7.6* 7.1*  HCT 27.3* 27.6* 25.9* 25.2* 23.6*  MCV 102.6* 104.2* 104.4* 103.7* 101.7*  PLT 313 350 365 392 402*   BMP &GFR Recent Labs  Lab 10/15/23 0402 10/16/23 0500 10/18/23 0037 10/19/23 0344 10/20/23 0308  NA 142 143 143 143 137  K 4.0 4.1 3.8 3.8 4.0  CL 113* 116* 120* 119* 115*  CO2 19* 16* 16* 16* 16*  GLUCOSE 109* 101* 134* 124* 123*  BUN 52* 51* 40* 37* 40*  CREATININE 0.71 0.74 0.72 0.62 0.64  CALCIUM 9.6 10.0 9.9 9.9 9.6  MG 2.4 2.2 2.1 2.1 1.8  PHOS 3.5 3.2 2.9 2.9 2.8   Estimated Creatinine Clearance: 42.9 mL/min (by C-G formula based on SCr of 0.64 mg/dL). Liver & Pancreas: Recent Labs  Lab 10/16/23 0500 10/19/23 0344 10/20/23 0308  AST 17 14* 13*  ALT 18 10 9   ALKPHOS 190* 118 100  BILITOT 0.3 <0.2 0.2  PROT 5.8* 5.3* 4.8*  ALBUMIN  3.0* 2.8* 2.6*   No results for input(s): LIPASE, AMYLASE in the last 168 hours. No results for input(s): AMMONIA in the last 168 hours. Diabetic: No results for input(s): HGBA1C in the last 72 hours. Recent Labs  Lab 10/14/23 2352 10/15/23 0757 10/15/23 1631 10/15/23 2344 10/16/23 0732  GLUCAP 129* 111* 107* 121* 116*   Cardiac Enzymes: No results for input(s): CKTOTAL, CKMB, CKMBINDEX, TROPONINI in the last 168 hours. Recent Labs    10/04/23 1310  PROBNP 529.0*   Coagulation Profile: Recent Labs  Lab 10/17/23 0335  INR 1.2   Thyroid  Function Tests: No results for input(s): TSH, T4TOTAL, FREET4, T3FREE, THYROIDAB in the last 72 hours. Lipid Profile: Recent Labs    10/20/23 0308  TRIG 65    Anemia Panel: Recent Labs    10/20/23 0308  RETICCTPCT 4.1*   Urine analysis:    Component Value Date/Time   COLORURINE YELLOW 10/14/2023 1049   APPEARANCEUR HAZY (A) 10/14/2023 1049   LABSPEC 1.017 10/14/2023 1049   PHURINE 9.0 (H) 10/14/2023 1049   GLUCOSEU NEGATIVE 10/14/2023 1049   HGBUR NEGATIVE 10/14/2023 1049    BILIRUBINUR NEGATIVE 10/14/2023 1049   KETONESUR NEGATIVE 10/14/2023 1049   PROTEINUR 30 (A) 10/14/2023 1049   NITRITE NEGATIVE 10/14/2023 1049   LEUKOCYTESUR SMALL (A) 10/14/2023 1049   Sepsis Labs: Invalid input(s): PROCALCITONIN, LACTICIDVEN  Microbiology: Recent Results (from the past 240 hours)  Urine Culture (for pregnant, neutropenic or urologic patients or patients with an indwelling urinary catheter)     Status: Abnormal   Collection Time: 10/14/23 10:49 AM   Specimen: Urine, Clean Catch  Result Value Ref Range Status  Specimen Description   Final    URINE, CLEAN CATCH Performed at [facility], [address] [place], [place] [address]    Special Requests   Final    NONE Performed at [facility], [address], [place], [place] [address]    Culture (A)  Final    >=100,000 COLONIES/mL PROTEUS MIRABILIS Two isolates with different morphologies were identified as the same organism.The most resistant organism was reported. Performed at [facility], [address], [place], [place] [address]    Report Status 10/17/2023 FINAL  Final   Organism ID, Bacteria PROTEUS MIRABILIS (A)  Final      Susceptibility   Proteus mirabilis - MIC*    AMPICILLIN <=2 SENSITIVE Sensitive     CEFAZOLIN (URINE) Value in next row Sensitive      4 SENSITIVEThis is a modified FDA-approved test that has been validated and its performance characteristics determined by the reporting laboratory.  This laboratory is certified under the Clinical Laboratory Improvement Amendments CLIA as qualified to perform high complexity clinical laboratory testing.    CEFEPIME Value in next row Sensitive      4 SENSITIVEThis is a modified FDA-approved test that has been validated and its performance characteristics determined by the reporting laboratory.  This laboratory is certified under the Clinical Laboratory Improvement Amendments CLIA as qualified to perform  high complexity clinical laboratory testing.    ERTAPENEM Value in next row Sensitive      4 SENSITIVEThis is a modified FDA-approved test that has been validated and its performance characteristics determined by the reporting laboratory.  This laboratory is certified under the Clinical Laboratory Improvement Amendments CLIA as qualified to perform high complexity clinical laboratory testing.    CEFTRIAXONE  Value in next row Sensitive      4 SENSITIVEThis is a modified FDA-approved test that has been validated and its performance characteristics determined by the reporting laboratory.  This laboratory is certified under the Clinical Laboratory Improvement Amendments CLIA as qualified to perform high complexity clinical laboratory testing.    CIPROFLOXACIN  Value in next row Sensitive      4 SENSITIVEThis is a modified FDA-approved test that has been validated and its performance characteristics determined by the reporting laboratory.  This laboratory is certified under the Clinical Laboratory Improvement Amendments CLIA as qualified to perform high complexity clinical laboratory testing.    GENTAMICIN Value in next row Sensitive      4 SENSITIVEThis is a modified FDA-approved test that has been validated and its performance characteristics determined by the reporting laboratory.  This laboratory is certified under the Clinical Laboratory Improvement Amendments CLIA as qualified to perform high complexity clinical laboratory testing.    NITROFURANTOIN Value in next row Resistant      4 SENSITIVEThis is a modified FDA-approved test that has been validated and its performance characteristics determined by the reporting laboratory.  This laboratory is certified under the Clinical Laboratory Improvement Amendments CLIA as qualified to perform high complexity clinical laboratory testing.    TRIMETH/SULFA Value in next row Sensitive      4 SENSITIVEThis is a modified FDA-approved test that has been validated and  its performance characteristics determined by the reporting laboratory.  This laboratory is certified under the Clinical Laboratory Improvement Amendments CLIA as qualified to perform high complexity clinical laboratory testing.    AMPICILLIN/SULBACTAM Value in next row Sensitive      4 SENSITIVEThis is a modified FDA-approved test that has been validated and  its performance characteristics determined by the reporting laboratory.  This laboratory is certified under the Clinical Laboratory Improvement Amendments CLIA as qualified to perform high complexity clinical laboratory testing.    PIP/TAZO Value in next row Sensitive      <=4 SENSITIVEThis is a modified FDA-approved test that has been validated and its performance characteristics determined by the reporting laboratory.  This laboratory is certified under the Clinical Laboratory Improvement Amendments CLIA as qualified to perform high complexity clinical laboratory testing.    MEROPENEM Value in next row Sensitive      <=4 SENSITIVEThis is a modified FDA-approved test that has been validated and its performance characteristics determined by the reporting laboratory.  This laboratory is certified under the Clinical Laboratory Improvement Amendments CLIA as qualified to perform high complexity clinical laboratory testing.    * >=100,000 COLONIES/mL PROTEUS MIRABILIS    Radiology Studies: No results found.    Dionta Larke T. Tayloranne Lekas [facility]  If 7PM-7AM, please contact night-coverage [email or web address] 10/20/2023, 12:56 PM

## 2023-10-20 NOTE — Progress Notes (Signed)
 Physical Therapy Treatment Patient Details Name: Tracey [name] MRN: [id number] DOB: 11-23-1940 Today's Date: 10/20/2023   History of Present Illness Patient is an 83 yo female admitted with SBO. On 10/07/2023 pt s/p diagnostic laparoscopy with LOA and repair of prior small bowel enterotomy. PMH: cognitive impairment, bipolar disorder, possible Parkinsons disease and h/o rection of GIST tumor, glaucoma, HTN    PT Comments   Pt admitted with above diagnosis.  Pt currently with functional limitations due to the deficits listed below (see PT Problem List). Pt in bed when PT arrived. Pt agreeable to therapy intervention. Pt required min A for bed mobility, CGA for sit to stand  from EOB and gait tasks in hallway with RW, CGA to close S and cues for 180 feet. Pt left seated in recliner and all needs in place. Pt will benefit from acute skilled PT to increase their independence and safety with mobility to allow discharge.      If plan is discharge home, recommend the following: A little help with walking and/or transfers;A little help with bathing/dressing/bathroom;Assistance with cooking/housework;Assist for transportation   Can travel by private vehicle     Yes  Equipment Recommendations  None recommended by PT    Recommendations for Other Services       Precautions / Restrictions Precautions Precautions: Fall Recall of Precautions/Restrictions: Impaired Restrictions Weight Bearing Restrictions Per Provider Order: No Other Position/Activity Restrictions: abdominal surgery     Mobility  Bed Mobility Overal bed mobility: Needs Assistance Bed Mobility: Supine to Sit     Supine to sit: Min assist, HOB elevated, Used rails     General bed mobility comments: min cues    Transfers Overall transfer level: Needs assistance Equipment used: Rolling walker (2 wheels) Transfers: Sit to/from Stand Sit to Stand: Contact guard assist           General transfer comment: min cues and  pull to stand at RW    Ambulation/Gait Ambulation/Gait assistance: Contact guard assist, Supervision Gait Distance (Feet): 180 Feet Assistive device: Rolling walker (2 wheels) Gait Pattern/deviations: Trunk flexed, Step-through pattern Gait velocity: decreased     General Gait Details: slight trunk flexion, min cues for safety, posture and RW management   Stairs             Wheelchair Mobility     Tilt Bed    Modified Rankin (Stroke Patients Only)       Balance Overall balance assessment: Needs assistance Sitting-balance support: Feet supported Sitting balance-Leahy Scale: Good     Standing balance support: Reliant on assistive device for balance, During functional activity, Bilateral upper extremity supported Standing balance-Leahy Scale: Poor Standing balance comment: CGA with B UE support at [employer]                            Communication Communication Communication: No apparent difficulties  Cognition Arousal: Alert Behavior During Therapy: WFL for tasks assessed/performed   PT - Cognitive impairments: History of cognitive impairments                       PT - Cognition Comments: pt pleasant, follows commands, self motivated to improve functional status Following commands: Intact      Cueing Cueing Techniques: Verbal cues  Exercises      General Comments        Pertinent Vitals/Pain Pain Assessment Pain Assessment: Faces Faces Pain Scale: Hurts little more Pain Location: abdomen  Pain Descriptors / Indicators: Constant, Discomfort, Dull Pain Intervention(s): Limited activity within patient's tolerance, Monitored during session, Repositioned    Home Living                          Prior Function            PT Goals (current goals can now be found in the care plan section) Acute Rehab PT Goals Patient Stated Goal: I love physical therapy PT Goal Formulation: With patient Time For Goal Achievement:  11/03/23 Potential to Achieve Goals: Good Progress towards PT goals: Progressing toward goals    Frequency    Min 2X/week      PT Plan      Co-evaluation              AM-PAC PT 6 Clicks Mobility   Outcome Measure  Help needed turning from your back to your side while in a flat bed without using bedrails?: A Little Help needed moving from lying on your back to sitting on the side of a flat bed without using bedrails?: A Little Help needed moving to and from a bed to a chair (including a wheelchair)?: A Little Help needed standing up from a chair using your arms (e.g., wheelchair or bedside chair)?: A Little Help needed to walk in hospital room?: A Little Help needed climbing 3-5 steps with a railing? : A Lot 6 Click Score: 17    End of Session Equipment Utilized During Treatment: Gait belt Activity Tolerance: Patient tolerated treatment well Patient left: with call bell/phone within reach;in chair;with chair alarm set Nurse Communication: Mobility status PT Visit Diagnosis: Muscle weakness (generalized) (M62.81);Other abnormalities of gait and mobility (R26.89);Unsteadiness on feet (R26.81);Other symptoms and signs involving the nervous system (R29.898)     Time: 8948-8887 PT Time Calculation (min) (ACUTE ONLY): 21 min  Charges:    $Gait Training: 8-22 mins PT General Charges $$ ACUTE PT VISIT: 1 Visit                     [place], PT Acute Rehab    [place] VEAR Drone 10/20/2023, 12:56 PM

## 2023-10-20 NOTE — Progress Notes (Signed)
 PHARMACY - TOTAL PARENTERAL NUTRITION CONSULT NOTE   Indication: Small bowel obstruction and likely post-op ileus  Patient Measurements: Height: 5' 2 (157.5 cm) Weight: 55.1 kg (121 lb 7.6 oz) IBW/kg (Calculated) : 50.1 TPN AdjBW (KG): 50.2 Body mass index is 22.22 kg/m.  Assessment:  83 YO female presenting 9/17 with abdominal pain and distension. Initial CT A/P showed high-grade small bowel obstruction with transition point in the pelvis to the right of the midline and mild mesenteric edema; repeat CT continued to show ongoing dilated small bowel loops. Patient taken to OR 9/23 for diagnostic laparoscopy with LOA and repair of prior small bowel enterotomy. NGT in place. Pharmacy consulted for TPN management in the setting of SBO.   Glucose / Insulin : No Hx DM - CBGs well controlled (goal 100-150) on goal rate TPN. CBG/s and SSI stopped 10/2 Electrolytes: Cl elevated at 115, Bicarb low at 16 (acetate maxed in TPN, also on Na bicarb 650 mg PO TID) , CorrCa elevated at 10.12 down (none in TPN), Other lytes WNL - goal for ileus and afib: K >4 and Mag >2 (resolved) Renal: BUN 40  elevated; SCr stable WNL <1 Hepatic: : albumin  2.6 down;  AST low, Tbili <0.2 - TG 199 elevated (9/29 labs)>>down to 65 (10/6) I/O:  - UOP: 1100 mL - mIVF: none currently - LBM: 10/4, ileus resolving/resolved  GI Imaging: - 9/17 a/p: High-grade small bowel obstruction with transition point in the pelvis to the right of midline - 9/19 DG abd: SBO pattern with no improvement since the prior CT - 9/21 CT chest/a/p: Small bowel loops remain dilated in the abdomen and pelvis. There is some diluted contrast in the dilated small bowel loops, but no discernible contrast in the colonic lumen. The degree of small-bowel dilatation appears stable to mildly progressive in the interval - 9/23 AXR: persistent SBO with mild improvement - 9/24 AXR: some decompression of SB in upper abdomen noted - 9/29 CT a/p: persistent SBO  with transition point in RLQ, signs of colitis and basilar atelectasis.  - 10/2 abd CT: nterval resolution of previously seen bowel obstruction.  wall collection - postoperative seroma or developing abscess?  GI Surgeries / Procedures:  - 9/23: Exploratory laparotomy with lysis of adhesions, primary repair of small bowel enterotomy  Central access: PICC obtained 9/23 TPN start date: 9/24  Nutritional Goals: Goal TPN rate is 65 mL/hr (provides 89 g of protein and 1619 kcals per day)  - 10/3: *Noted TPN tubing broken. TPN off with D10W since early this AM infusing - 10/4: on full liquid diet  RD Assessment: Estimated Needs Total Energy Estimated Needs: 1500-1700 kcals Total Protein Estimated Needs: 75-90 grams Total Fluid Estimated Needs: >/= 1.5L  Current Nutrition:  NPO and TPN  Plan:  Calorie count Today, at 1800: Continue TPN at goal rate of 65 mL/hr Electrolytes in TPN:  Na 20 mEq/L Increase K to 40 mEq/L Ca 0 mEq/L Mg 5 mEq/L Phos 20 mmol/L Cl:Ac max Ac  Add standard MVI and trace elements to TPN 10/2: D/c SSI and CBGs Further mIVF per MD (none currently) Monitor TPN labs on Mon/Thurs, and PRN   Tracey [name], PharmD, BCPS Clinical Staff Pharmacist 10/20/2023 9:23 AM

## 2023-10-20 NOTE — Plan of Care (Signed)

## 2023-10-21 DIAGNOSIS — F0390 Unspecified dementia without behavioral disturbance: Secondary | ICD-10-CM | POA: Diagnosis not present

## 2023-10-21 DIAGNOSIS — H9193 Unspecified hearing loss, bilateral: Secondary | ICD-10-CM | POA: Diagnosis not present

## 2023-10-21 DIAGNOSIS — K56609 Unspecified intestinal obstruction, unspecified as to partial versus complete obstruction: Secondary | ICD-10-CM | POA: Diagnosis not present

## 2023-10-21 DIAGNOSIS — D62 Acute posthemorrhagic anemia: Secondary | ICD-10-CM | POA: Diagnosis not present

## 2023-10-21 LAB — CBC WITH DIFFERENTIAL/PLATELET
Abs Immature Granulocytes: 0.2 K/uL — ABNORMAL HIGH (ref 0.00–0.07)
Basophils Absolute: 0 K/uL (ref 0.0–0.1)
Basophils Relative: 0 %
Eosinophils Absolute: 0 K/uL (ref 0.0–0.5)
Eosinophils Relative: 0 %
HCT: 26.9 % — ABNORMAL LOW (ref 36.0–46.0)
Hemoglobin: 7.8 g/dL — ABNORMAL LOW (ref 12.0–15.0)
Lymphocytes Relative: 14 %
Lymphs Abs: 1.2 K/uL (ref 0.7–4.0)
MCH: 29.7 pg (ref 26.0–34.0)
MCHC: 29 g/dL — ABNORMAL LOW (ref 30.0–36.0)
MCV: 102.3 fL — ABNORMAL HIGH (ref 80.0–100.0)
Monocytes Absolute: 0.3 K/uL (ref 0.1–1.0)
Monocytes Relative: 4 %
Myelocytes: 2 %
Neutro Abs: 6.8 K/uL (ref 1.7–7.7)
Neutrophils Relative %: 80 %
Platelets: 456 K/uL — ABNORMAL HIGH (ref 150–400)
RBC: 2.63 MIL/uL — ABNORMAL LOW (ref 3.87–5.11)
RDW: 16.1 % — ABNORMAL HIGH (ref 11.5–15.5)
WBC: 8.5 K/uL (ref 4.0–10.5)
nRBC: 0 % (ref 0.0–0.2)

## 2023-10-21 LAB — HEPARIN LEVEL (UNFRACTIONATED): Heparin Unfractionated: 0.53 [IU]/mL (ref 0.30–0.70)

## 2023-10-21 MED ORDER — TRAVASOL 10 % IV SOLN
INTRAVENOUS | Status: AC
  Filled 2023-10-21: qty 889.2

## 2023-10-21 NOTE — Progress Notes (Signed)
 PHARMACY - ANTICOAGULATION CONSULT NOTE  Pharmacy Consult for Heparin   Indication: atrial fibrillation  Allergies  Allergen Reactions   Lisinopril Cough   Penicillins Itching and Other (See Comments)    50 years ago   Adhesive [Tape] Itching   Atorvastatin Itching   Dilaudid  [Hydromorphone  Hcl] Itching    Patient Measurements: Height: 5' 2 (157.5 cm) Weight: 55.1 kg (121 lb 7.6 oz) IBW/kg (Calculated) : 50.1 HEPARIN  DW (KG): 50.2  Vital Signs: Temp: 98.1 F (36.7 C) (10/07 0405) Temp Source: Oral (10/07 0405) BP: 138/64 (10/07 0405) Pulse Rate: 64 (10/07 0405)  Labs: Recent Labs    10/19/23 0344 10/20/23 0308 10/21/23 0305  HGB 7.6* 7.1* 7.8*  HCT 25.2* 23.6* 26.9*  PLT 392 402* 456*  HEPARINUNFRC 0.52 0.46 0.53  CREATININE 0.62 0.64  --     Estimated Creatinine Clearance: 42.9 mL/min (by C-G formula based on SCr of 0.64 mg/dL).   Medical History: 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, possible    Tremors possible parkinsons   Manic disorder, single episode, in full remission 12/01/2009   Sixth nerve palsy     Assessment:  AC/Heme: UFH per Rx for new Afib. CHADS2VASC: 4.   - Hep level continues to be therapeutic on current IV heparin  rate of 900 units/hr - CBC: Hgb 7.8 but stable, Plts 456 elevated - No reported bleeding or issues  Goal of Therapy:  Heparin  level 0.3-0.7 units/ml Monitor platelets  by anticoagulation protocol: Yes   Plan:  Con't IV heparin  at 900 units/hr Daily HL and CBC   Eva CHRISTELLA Allis, PharmD, BCPS Secure Chat if ?s 10/21/2023 7:21 AM

## 2023-10-21 NOTE — Progress Notes (Signed)
 PROGRESS NOTE  Tracey [name] FMW:[id number] DOB: January 25, 1940   PCP: Mast, Man X, NP  Patient is from: Home.  DOA: 10/01/2023 LOS: 20  Chief complaints Chief Complaint  Patient presents with   Constipation     Brief Narrative / Interim history: 83 year old F with PMH of cognitive impairment, possible Parkinson's disease, GIST, bipolar disorder and chronic neck pain presenting with abdominal pain and distention and admitted with SBO.  CT showed high-grade SBO with transition point in the pelvis to the right of the midline and mild mesenteric edema.  She failed conservative management with NG tube decompression.  Repeat CT on 9/21 with ongoing SBO .  Eventually, she underwent diagnostic laparoscopy with LOA and repair of prior small bowel enterotomy on 9/23.  Hospital course complicated by postop ileus requiring NG tube and TPN.  She also had worsening leukocytosis.  Repeat CT abdomen and pelvis on 9/29 showed persistent SBO with transition point in RLQ, new mild regional inflammatory/edematous change as well as scattered peripheral enhancing small fluid collections concerning for colitis.  Patient was started on IV cefepime and Flagyl by general surgery.  Repeat CT abdomen and pelvis on 10/2 with interval resolution of SBO and mild interval decrease in intra-abdominal fluid collection.  IR reengaged and recommended holding off IR drainage since collection is smaller and patient is improving.  Patient pulled out NG tube the night of 10/2.  Had a bowel movement.  No nausea or vomiting.  Advanced to full liquid diet.  Also on TPN.  Calorie count started on 10/5.  General surgery following.  Subjective: Seen and examined earlier this afternoon.  No major events overnight or this morning.  No complaints but not a great historian.  Always talks about going home.   Denies nausea, vomiting or pain.  Poor p.o. intake.    Objective: Vitals:   10/20/23 2052 10/21/23 0405 10/21/23 0845 10/21/23 1321   BP: 126/86 138/64 (!) 144/57 117/75  Pulse: 66 64 67 (!) 59  Resp: 17 19 18 18   Temp: 98 F (36.7 C) 98.1 F (36.7 C)  98.5 F (36.9 C)  TempSrc: Oral Oral  Oral  SpO2: 100% 100% 100% 99%  Weight:      Height:        Examination:  GENERAL: No apparent distress.  Nontoxic. HEENT: MMM.  Vision and hearing grossly intact.  NECK: Supple.  No apparent JVD.  RESP:  No IWOB.  Fair aeration bilaterally. CVS:  RRR. Heart sounds normal.  ABD/GI/GU: BS+. Abd soft, NTND.  MSK/EXT:   No apparent deformity. Moves extremities. No edema.  SKIN: no apparent skin lesion or wound NEURO: Awake and alert. Oriented fairly.  No apparent focal neuro deficit. PSYCH: Calm. Normal affect.   Consultants:  General Surgery  Procedures: 9/23-diagnostic laparoscopy, LOA and repair of prior small bowel enterotomy   Microbiology summarized: 9/20-MRSA PCR screen nonreactive 9/20-blood cultures negative. 9/25-blood cultures negative. 9/30-urine culture with pansensitive Proteus mirabilis except to Macrobid  Assessment and plan: Small bowel obstruction: Presents with abdominal pain and distention.  CT confirmed high-grade SBO with transition point in RLQ.  Failed conservative management with NG tube decompression.  Repeat CT on 9/21 with persistent SBO.  Underwent diagnostic laparoscopy, LOA and repair of prior small bowel enterotomy on 9/23.  Postop was complicated by prolonged ileus, possible colitis and intra-abdominal abscesses as noted on repeat CT on 9/29.  Repeat CT on 10/2 with resolution of SBO and improved intra-abdominal fluid collection.  NG tube came  out on 10/2.  No nausea, vomiting or abdominal pain.  -General surgery following -Started on IV cefepime and Flagyl by surgery on 9/30>> -Continue TPN, FLD and oral supplements.  Calorie count underway. -Mobilize patient   Postoperative blood loss anemia?  Hgb slowly drifting down.  Received IV iron  during this hospitalization.  No overt  bleeding.  She is on IV heparin  for A-fib. Recent Labs    10/13/23 1532 10/14/23 0331 10/14/23 0451 10/15/23 0402 10/16/23 0500 10/17/23 0335 10/18/23 0037 10/19/23 0344 10/20/23 0308 10/21/23 0305  HGB 9.3* 8.7* 9.0* 8.1* 8.1* 8.1* 7.8* 7.6* 7.1* 7.8*  -Continue monitoring -Continue IV Protonix  -Monitor H&H.  Transfuse for Hgb <7.0.  Paroxysmal A-fib with RVR: Likely in the setting of #1.  Required IV amiodarone  at 1 point. -Resumed p.o. amiodarone  100 mg on 10/3 per card recs. -On IV heparin  for anticoagulation -Cardiology signed off -Optimize electrolytes  Possible postoperative ileus: Seems to have resolved.  Having BMs. -Management as above. -Mobilize patient  Hypotension: Normotensive for most part. - Continue monitoring  Dementia without behavioral disturbance: Oriented x 4 except date. - Reorientation and delirium precaution.  Hypokalemia/hypophosphatemia - Pharmacy replenishing with TPN.   Chronic neck pain/cervicalgia: Chronic.  Denies pain today. -Continue pain meds, muscle relaxers and Voltaren  gel. -OOB  Concern for otitis externa -Completed 5 days of Ciprodex  otic    Acute urinary retention: Passed voiding trial on 10/5. - Closely monitor.  UTI?  UA concerning.  Urine culture with Proteus mirabilis. - Already on antibiotics  Severe malnutrition Body mass index is 22.22 kg/m. Nutrition Problem: Severe Malnutrition Etiology: chronic illness Signs/Symptoms: severe fat depletion, severe muscle depletion, percent weight loss (11% in 6 months) Percent weight loss: 11 % (in 6 months) Interventions: Boost Breeze, Magic cup, TPN, Ensure Enlive (each supplement provides 350kcal and 20 grams of protein)   DVT prophylaxis:  Place and maintain sequential compression device Start: 10/13/23 0945 SCDs Start: 10/01/23 2349  Code Status: DNR. Family Communication: None at bedside Level of care: Telemetry Status is: Inpatient Remains inpatient appropriate  because: SBO, postop ileus, intra-abdominal infection   Final disposition: SNF   35 minutes with more than 50% spent in reviewing records, counseling patient/family and coordinating care.   Sch Meds:  Scheduled Meds:  amiodarone   100 mg Oral Daily   Chlorhexidine  Gluconate Cloth  6 each Topical QHS   cycloSPORINE   1 drop Both Eyes BID   feeding supplement  1 Container Oral TID WC   feeding supplement  237 mL Oral BID BM   latanoprost   1 drop Both Eyes QHS   lidocaine   1 patch Transdermal Q24H   pantoprazole  (PROTONIX ) IV  80 mg Intravenous Q12H   sodium bicarbonate  650 mg Oral TID   sodium chloride  flush  10-40 mL Intracatheter Q12H   timolol   1 drop Both Eyes Daily   Continuous Infusions:  ceFEPime (MAXIPIME) IV 2 g (10/21/23 0845)   heparin  900 Units/hr (10/20/23 1407)   metronidazole 500 mg (10/21/23 1130)   TPN ADULT (ION) 65 mL/hr at 10/20/23 1810   TPN ADULT (ION)     PRN Meds:.acetaminophen , artificial tears, LORazepam , methocarbamol  (ROBAXIN ) injection, morphine  injection, Muscle Rub, ondansetron  **OR** ondansetron  (ZOFRAN ) IV, mouth rinse, sodium chloride  flush  Antimicrobials: Anti-infectives (From admission, onward)    Start     Dose/Rate Route Frequency Ordered Stop   10/14/23 0930  ceFEPIme (MAXIPIME) 2 g in sodium chloride  0.9 % 100 mL IVPB  2 g 200 mL/hr over 30 Minutes Intravenous Every 12 hours 10/14/23 0820     10/14/23 0900  metroNIDAZOLE (FLAGYL) IVPB 500 mg        500 mg 100 mL/hr over 60 Minutes Intravenous 2 times daily 10/14/23 0811     10/07/23 0915  cefoTEtan  (CEFOTAN ) 2 g in sodium chloride  0.9 % 100 mL IVPB        2 g 200 mL/hr over 30 Minutes Intravenous On call to O.R. 10/07/23 0820 10/07/23 1938        I have personally reviewed the following labs and images: CBC: Recent Labs  Lab 10/17/23 0335 10/18/23 0037 10/19/23 0344 10/20/23 0308 10/21/23 0305  WBC 9.6 8.8 8.2 7.1 8.5  NEUTROABS 7.5 6.5 6.0 5.1 6.8  HGB 8.1* 7.8*  7.6* 7.1* 7.8*  HCT 27.6* 25.9* 25.2* 23.6* 26.9*  MCV 104.2* 104.4* 103.7* 101.7* 102.3*  PLT 350 365 392 402* 456*   BMP &GFR Recent Labs  Lab 10/15/23 0402 10/16/23 0500 10/18/23 0037 10/19/23 0344 10/20/23 0308  NA 142 143 143 143 137  K 4.0 4.1 3.8 3.8 4.0  CL 113* 116* 120* 119* 115*  CO2 19* 16* 16* 16* 16*  GLUCOSE 109* 101* 134* 124* 123*  BUN 52* 51* 40* 37* 40*  CREATININE 0.71 0.74 0.72 0.62 0.64  CALCIUM 9.6 10.0 9.9 9.9 9.6  MG 2.4 2.2 2.1 2.1 1.8  PHOS 3.5 3.2 2.9 2.9 2.8   Estimated Creatinine Clearance: 42.9 mL/min (by C-G formula based on SCr of 0.64 mg/dL). Liver & Pancreas: Recent Labs  Lab 10/16/23 0500 10/19/23 0344 10/20/23 0308  AST 17 14* 13*  ALT 18 10 9   ALKPHOS 190* 118 100  BILITOT 0.3 <0.2 0.2  PROT 5.8* 5.3* 4.8*  ALBUMIN  3.0* 2.8* 2.6*   No results for input(s): LIPASE, AMYLASE in the last 168 hours. No results for input(s): AMMONIA in the last 168 hours. Diabetic: No results for input(s): HGBA1C in the last 72 hours. Recent Labs  Lab 10/14/23 2352 10/15/23 0757 10/15/23 1631 10/15/23 2344 10/16/23 0732  GLUCAP 129* 111* 107* 121* 116*   Cardiac Enzymes: No results for input(s): CKTOTAL, CKMB, CKMBINDEX, TROPONINI in the last 168 hours. Recent Labs    10/04/23 1310  PROBNP 529.0*   Coagulation Profile: Recent Labs  Lab 10/17/23 0335  INR 1.2   Thyroid  Function Tests: No results for input(s): TSH, T4TOTAL, FREET4, T3FREE, THYROIDAB in the last 72 hours. Lipid Profile: Recent Labs    10/20/23 0308  TRIG 65    Anemia Panel: Recent Labs    10/20/23 0308 10/20/23 1520  VITAMINB12  --  931*  FOLATE  --  12.9  FERRITIN  --  511*  TIBC  --  225*  IRON   --  49  RETICCTPCT 4.1*  --    Urine analysis:    Component Value Date/Time   COLORURINE YELLOW 10/14/2023 1049   APPEARANCEUR HAZY (A) 10/14/2023 1049   LABSPEC 1.017 10/14/2023 1049   PHURINE 9.0 (H) 10/14/2023 1049   GLUCOSEU  NEGATIVE 10/14/2023 1049   HGBUR NEGATIVE 10/14/2023 1049   BILIRUBINUR NEGATIVE 10/14/2023 1049   KETONESUR NEGATIVE 10/14/2023 1049   PROTEINUR 30 (A) 10/14/2023 1049   NITRITE NEGATIVE 10/14/2023 1049   LEUKOCYTESUR SMALL (A) 10/14/2023 1049   Sepsis Labs: Invalid input(s): PROCALCITONIN, LACTICIDVEN  Microbiology: Recent Results (from the past 240 hours)  Urine Culture (for pregnant, neutropenic or urologic patients or patients with an indwelling urinary catheter)  Status: Abnormal   Collection Time: 10/14/23 10:49 AM   Specimen: Urine, Clean Catch  Result Value Ref Range Status   Specimen Description   Final    URINE, CLEAN CATCH Performed at [facility], [address], [place], [place] [address]    Special Requests   Final    NONE Performed at [facility], [address] [place], [place] [address]    Culture (A)  Final    >=100,000 COLONIES/mL PROTEUS MIRABILIS Two isolates with different morphologies were identified as the same organism.The most resistant organism was reported. Performed at [facility], [address] [place], [place] [address]    Report Status 10/17/2023 FINAL  Final   Organism ID, Bacteria PROTEUS MIRABILIS (A)  Final      Susceptibility   Proteus mirabilis - MIC*    AMPICILLIN <=2 SENSITIVE Sensitive     CEFAZOLIN (URINE) Value in next row Sensitive      4 SENSITIVEThis is a modified FDA-approved test that has been validated and its performance characteristics determined by the reporting laboratory.  This laboratory is certified under the Clinical Laboratory Improvement Amendments CLIA as qualified to perform high complexity clinical laboratory testing.    CEFEPIME Value in next row Sensitive      4 SENSITIVEThis is a modified FDA-approved test that has been validated and its performance characteristics determined by the reporting laboratory.  This laboratory is certified under the Clinical  Laboratory Improvement Amendments CLIA as qualified to perform high complexity clinical laboratory testing.    ERTAPENEM Value in next row Sensitive      4 SENSITIVEThis is a modified FDA-approved test that has been validated and its performance characteristics determined by the reporting laboratory.  This laboratory is certified under the Clinical Laboratory Improvement Amendments CLIA as qualified to perform high complexity clinical laboratory testing.    CEFTRIAXONE  Value in next row Sensitive      4 SENSITIVEThis is a modified FDA-approved test that has been validated and its performance characteristics determined by the reporting laboratory.  This laboratory is certified under the Clinical Laboratory Improvement Amendments CLIA as qualified to perform high complexity clinical laboratory testing.    CIPROFLOXACIN  Value in next row Sensitive      4 SENSITIVEThis is a modified FDA-approved test that has been validated and its performance characteristics determined by the reporting laboratory.  This laboratory is certified under the Clinical Laboratory Improvement Amendments CLIA as qualified to perform high complexity clinical laboratory testing.    GENTAMICIN Value in next row Sensitive      4 SENSITIVEThis is a modified FDA-approved test that has been validated and its performance characteristics determined by the reporting laboratory.  This laboratory is certified under the Clinical Laboratory Improvement Amendments CLIA as qualified to perform high complexity clinical laboratory testing.    NITROFURANTOIN Value in next row Resistant      4 SENSITIVEThis is a modified FDA-approved test that has been validated and its performance characteristics determined by the reporting laboratory.  This laboratory is certified under the Clinical Laboratory Improvement Amendments CLIA as qualified to perform high complexity clinical laboratory testing.    TRIMETH/SULFA Value in next row Sensitive      4  SENSITIVEThis is a modified FDA-approved test that has been validated and its performance characteristics determined by the reporting laboratory.  This laboratory is certified under the Clinical Laboratory Improvement Amendments CLIA as qualified to perform high complexity clinical laboratory testing.  AMPICILLIN/SULBACTAM Value in next row Sensitive      4 SENSITIVEThis is a modified FDA-approved test that has been validated and its performance characteristics determined by the reporting laboratory.  This laboratory is certified under the Clinical Laboratory Improvement Amendments CLIA as qualified to perform high complexity clinical laboratory testing.    PIP/TAZO Value in next row Sensitive      <=4 SENSITIVEThis is a modified FDA-approved test that has been validated and its performance characteristics determined by the reporting laboratory.  This laboratory is certified under the Clinical Laboratory Improvement Amendments CLIA as qualified to perform high complexity clinical laboratory testing.    MEROPENEM Value in next row Sensitive      <=4 SENSITIVEThis is a modified FDA-approved test that has been validated and its performance characteristics determined by the reporting laboratory.  This laboratory is certified under the Clinical Laboratory Improvement Amendments CLIA as qualified to perform high complexity clinical laboratory testing.    * >=100,000 COLONIES/mL PROTEUS MIRABILIS    Radiology Studies: No results found.    Dorthia Tout T. Neesha Langton [facility]  If 7PM-7AM, please contact night-coverage [email or web address] 10/21/2023, 2:09 PM

## 2023-10-21 NOTE — Progress Notes (Signed)
 Progress Note  14 Days Post-Op  Subjective: Patient reports some discomfort of the lower abdomen. Unsure if she has had a bowel movement today (None recorded today). Reports flatulence. She has had ensure this morning, but has not tried anything else for breakfast this morning. Denies nausea and vomiting.   ROS  All negative with the exception of above.  Objective: Vital signs in last 24 hours: Temp:  [98 F (36.7 C)-98.1 F (36.7 C)] 98.1 F (36.7 C) (10/07 0405) Pulse Rate:  [64-67] 67 (10/07 0845) Resp:  [17-19] 18 (10/07 0845) BP: (126-144)/(57-86) 144/57 (10/07 0845) SpO2:  [100 %] 100 % (10/07 0845) Last BM Date : 10/18/23  Intake/Output from previous day: 10/06 0701 - 10/07 0700 In: 3082.7 [P.O.:837; I.V.:1645.7; IV Piggyback:600] Out: 650 [Urine:650] Intake/Output this shift: Total I/O In: -  Out: 600 [Urine:600]  PE: General: Pleasant female who is laying in bed in NAD. HEENT: Head is normocephalic, atraumatic.   Heart: HR normal. Lungs: Respiratory effort nonlabored Abd: Soft with mild distention. Nontender to palpation. Midline incision present with stables. C/D/I. No rebound tenderness or guarding.  Skin: Warm and dry. Psych: A&Ox3 with an appropriate affect.    Lab Results:  Recent Labs    10/20/23 0308 10/21/23 0305  WBC 7.1 8.5  HGB 7.1* 7.8*  HCT 23.6* 26.9*  PLT 402* 456*   BMET Recent Labs    10/19/23 0344 10/20/23 0308  NA 143 137  K 3.8 4.0  CL 119* 115*  CO2 16* 16*  GLUCOSE 124* 123*  BUN 37* 40*  CREATININE 0.62 0.64  CALCIUM 9.9 9.6   PT/INR No results for input(s): LABPROT, INR in the last 72 hours. CMP     Component Value Date/Time   NA 137 10/20/2023 0308   NA 139 01/26/2020 0000   NA 141 04/24/2012 0929   K 4.0 10/20/2023 0308   K 4.4 04/24/2012 0929   CL 115 (H) 10/20/2023 0308   CL 109 (H) 04/24/2012 0929   CO2 16 (L) 10/20/2023 0308   CO2 23 04/24/2012 0929   GLUCOSE 123 (H) 10/20/2023 0308   GLUCOSE  85 04/24/2012 0929   BUN 40 (H) 10/20/2023 0308   BUN 19 01/26/2020 0000   BUN 20.6 04/24/2012 0929   CREATININE 0.64 10/20/2023 0308   CREATININE 0.79 04/18/2023 1524   CREATININE 0.9 04/24/2012 0929   CALCIUM 9.6 10/20/2023 0308   CALCIUM 10.5 (H) 01/12/2020 2123   CALCIUM 10.0 04/24/2012 0929   PROT 4.8 (L) 10/20/2023 0308   PROT 6.7 04/24/2012 0929   ALBUMIN  2.6 (L) 10/20/2023 0308   ALBUMIN  3.5 04/24/2012 0929   AST 13 (L) 10/20/2023 0308   AST 20 04/24/2012 0929   ALT 9 10/20/2023 0308   ALT 12 04/24/2012 0929   ALKPHOS 100 10/20/2023 0308   ALKPHOS 75 04/24/2012 0929   BILITOT 0.2 10/20/2023 0308   BILITOT 0.36 04/24/2012 0929   GFRNONAA >60 10/20/2023 0308   GFRNONAA 58 (L) 05/11/2020 0700   GFRAA 68 05/11/2020 0700   Lipase     Component Value Date/Time   LIPASE 22 09/18/2013 1810       Studies/Results: No results found.  Anti-infectives: Anti-infectives (From admission, onward)    Start     Dose/Rate Route Frequency Ordered Stop   10/14/23 0930  ceFEPIme (MAXIPIME) 2 g in sodium chloride  0.9 % 100 mL IVPB        2 g 200 mL/hr over 30 Minutes Intravenous Every 12 hours  10/14/23 0820     10/14/23 0900  metroNIDAZOLE (FLAGYL) IVPB 500 mg        500 mg 100 mL/hr over 60 Minutes Intravenous 2 times daily 10/14/23 0811     10/07/23 0915  cefoTEtan  (CEFOTAN ) 2 g in sodium chloride  0.9 % 100 mL IVPB        2 g 200 mL/hr over 30 Minutes Intravenous On call to O.R. 10/07/23 0820 10/07/23 1938        Assessment/Plan SBO POD 14 s/p dx lap converted to ex lap with LOA and repair of enterotomy, Dr. Dasie 9/23. - CT AP 9/29 reviewed independently and with IR - pelvic collection does appear to be developing and seems to communicate with other small collections but is too early to drain - CT repeated 10/2 showed intra-abdominal collection is smaller, but it does appear more mature/thick-walled. Clinically the patient has a resolving ileus and her leukocytosis has  resolved. Discussed with IR, will hold off on drainage since collection is smaller and patient improving, if there is a clinical change they can re-consider drainage. - Afebrile. - WBC 8.5, Hgb 7.8 (stable) - Having flatus. No n/v. - Continue TPN. Encouraged PO intake. Calorie count ongoing. - Will continue to follow.   FEN: TPN, FLD VTE: Heparin  infusion ID: Cefepime/Metronidazole   - Per TRH - Dementia  GERD Tremors  HTN  Anxiety  A fib RVR - on amio gtt, heparin  gtt  Anemia    LOS: 20 days   I reviewed hospitalist notes, specialist notes, nursing notes, last 24 h vitals and pain scores, last 48 h intake and output, last 24 h labs and trends, and last 24 h imaging results.   Marjorie Carlyon Favre, [facility] Surgery 10/21/2023, 1:05 PM Please see Amion for pager number during day hours 7:00am-4:30pm

## 2023-10-21 NOTE — Plan of Care (Signed)

## 2023-10-21 NOTE — Progress Notes (Signed)
 PHARMACY - TOTAL PARENTERAL NUTRITION CONSULT NOTE   Indication: Small bowel obstruction and likely post-op ileus  Patient Measurements: Height: 5' 2 (157.5 cm) Weight: 55.1 kg (121 lb 7.6 oz) IBW/kg (Calculated) : 50.1 TPN AdjBW (KG): 50.2 Body mass index is 22.22 kg/m.  Assessment:  83 YO female presenting 9/17 with abdominal pain and distension. Initial CT A/P showed high-grade small bowel obstruction with transition point in the pelvis to the right of the midline and mild mesenteric edema; repeat CT continued to show ongoing dilated small bowel loops. Patient taken to OR 9/23 for diagnostic laparoscopy with LOA and repair of prior small bowel enterotomy. NGT in place. Pharmacy consulted for TPN management in the setting of SBO.   Glucose / Insulin : No Hx DM - CBGs well controlled (goal 100-150) on goal rate TPN. CBG/s and SSI stopped 10/2 Electrolytes: (10/6) Cl elevated at 115, Bicarb low at 16 (acetate maxed in TPN, also on Na bicarb 650 mg PO TID) , CorrCa elevated at 10.12 down (none in TPN), Other lytes WNL - goal for ileus and afib: K >4 and Mag >2 (resolved) Renal: BUN 40  elevated; SCr stable WNL <1 Hepatic: : albumin  2.6 down;  AST low, Tbili <0.2 - TG 199 elevated (9/29 labs)>>down to 65 (10/6) I/O:  - UOP: only 650 mL charted - mIVF: none currently - po: 837 FLD (Boost x 1, no Ensure last 24h) - LBM: 10/4, ileus resolving/resolved  GI Imaging: - 9/17 a/p: High-grade small bowel obstruction with transition point in the pelvis to the right of midline - 9/19 DG abd: SBO pattern with no improvement since the prior CT - 9/21 CT chest/a/p: Small bowel loops remain dilated in the abdomen and pelvis. There is some diluted contrast in the dilated small bowel loops, but no discernible contrast in the colonic lumen. The degree of small-bowel dilatation appears stable to mildly progressive in the interval - 9/23 AXR: persistent SBO with mild improvement - 9/24 AXR: some  decompression of SB in upper abdomen noted - 9/29 CT a/p: persistent SBO with transition point in RLQ, signs of colitis and basilar atelectasis.  - 10/2 abd CT: nterval resolution of previously seen bowel obstruction.  wall collection - postoperative seroma or developing abscess?  GI Surgeries / Procedures:  - 9/23: Exploratory laparotomy with lysis of adhesions, primary repair of small bowel enterotomy  Central access: PICC obtained 9/23 TPN start date: 9/24  Nutritional Goals: Goal TPN rate is 65 mL/hr (provides 89 g of protein and 1619 kcals per day)  - 10/3: *Noted TPN tubing broken. TPN off with D10W since early this AM infusing - 10/4: on full liquid diet  RD Assessment: Estimated Needs Total Energy Estimated Needs: 1500-1700 kcals Total Protein Estimated Needs: 75-90 grams Total Fluid Estimated Needs: >/= 1.5L  Current Nutrition:  NPO and TPN  Plan:  Calorie count 10/5: Patient unlikely to meet needs with full liquids without supplements   Today, at 1800: Continue TPN at goal rate of 65 mL/hr Electrolytes in TPN:  Na 20 mEq/L Increase K to 40 mEq/L Ca 0 mEq/L Mg 5 mEq/L Phos 20 mmol/L Cl:Ac max Ac  Add standard MVI and trace elements to TPN 10/2: D/c SSI and CBGs Further mIVF per MD (none currently) Monitor TPN labs on Mon/Thurs, and PRN   Tracey [name], PharmD, BCPS Clinical Staff Pharmacist 10/21/2023 10:47 AM

## 2023-10-21 NOTE — Progress Notes (Signed)
 Calorie Count Note  48 hour calorie count ordered. Day 2 results.  Diet: Full liquids Supplements:  -Ensure Plus High Protein po TID, each supplement provides 350 kcal and 20 grams of protein.  -Boost Breeze po TID with meals, each supplement provides 250 kcal and 9 grams of protein  -Magic cup BID with meals, each supplement provides 290 kcal and 9 grams of protein   10/6: Breakfast: 160 kcals, 8g protein Lunch: 120 kcals, 8g protein Dinner: 245 kcals, 10g protein Supplements: The equivalent of 1 Boost Breeze =250 kcals, 9g protein  Total intake: 775 kcal (51% of minimum estimated needs)  35g protein (46% of minimum estimated needs)  Nutrition Dx: Severe Malnutrition related to chronic illness as evidenced by severe fat depletion, severe muscle depletion, percent weight loss (11% in 6 months)   Goal: Pt to meet >/= 90% of their estimated nutrition needs   Intervention:  -Continue Ensure, Boost Breeze and Magic cups -Encourage PO intakes -Continue TPN  Morna Lee, MS, RD, LDN Inpatient Clinical Dietitian Contact via Secure chat

## 2023-10-21 NOTE — Progress Notes (Signed)
 Occupational Therapy Treatment Patient Details Name: Tracey [name] MRN: [id number] DOB: [birth year] Today's Date: 10/21/2023   History of present illness Patient is an 83 yr old female admitted with SBO. On 10/07/2023 pt s/p diagnostic laparoscopy with lysis of adhesions and repair of prior small bowel enterotomy. PMH: cognitive impairment, bipolar disorder, possible Parkinsons disease and h/o rection of GIST tumor, glaucoma, HTN   OT comments  The pt was seen for functional strengthening, progression of ADL participation and ADL instruction. She progressed to performing toileting at bathroom level, requiring assistance in this regard. She further required CGA for upper body grooming standing at the sink. She was assisted to the bedside chair at the end of the session, to promote progressive out of bed tolerance and upright positioning. She reported some fatigue with activity. Continue OT plan of care to address her functional deficits, in order to maximize her independence with meaningful activities. Patient will benefit from continued inpatient follow up therapy, <3 hours/day.       If plan is discharge home, recommend the following:  A lot of help with bathing/dressing/bathroom;Assistance with cooking/housework;A little help with walking and/or transfers   Equipment Recommendations  Other (comment) (to be determined pending progress at next setting)    Recommendations for Other Services      Precautions / Restrictions Precautions Precautions: Fall Restrictions Weight Bearing Restrictions Per Provider Order: No Other Position/Activity Restrictions: abdominal surgery       Mobility Bed Mobility Overal bed mobility: Needs Assistance Bed Mobility: Supine to Sit     Supine to sit: Min assist, Used rails, HOB elevated          Transfers Overall transfer level: Needs assistance Equipment used: Rolling walker (2 wheels) Transfers: Sit to/from Stand Sit to Stand: Contact guard  assist, From elevated surface          Balance       Sitting balance - Comments: static sitting-good. dynamic sitting-fair+       Standing balance comment: CGA to min assist with RW           ADL either performed or assessed with clinical judgement   ADL Overall ADL's : Needs assistance/impaired Eating/Feeding: Set up;Sitting Eating/Feeding Details (indicate cue type and reason): Pt initiated self-feeding tasks seated in the bedside chair. Grooming: Contact guard assist;Standing Grooming Details (indicate cue type and reason): She required steadying assist to perform hand washing standing at the sink. She was also verbally cued to demo upright posture and to step closer to the sink for added safety when performing task.                 Toilet Transfer: Minimal assistance;Rolling walker (2 wheels);Cueing for safety;Grab bars Toilet Transfer Details (indicate cue type and reason): She ambulated to and from the bathroom in her room using a RW. She required min assist to transfer onto and off toilet with cues needed for walker placement and use of grab bar for support. Toileting- Clothing Manipulation and Hygiene: Moderate assistance;Cueing for safety;Sit to/from stand Toileting - Clothing Manipulation Details (indicate cue type and reason): She required assist for clothing management, steadying assist in standing, cues for upright posture in standing, and assist for thoroughness after performing peri-hygiene.                      Communication Communication Communication: No apparent difficulties   Cognition Arousal: Alert Behavior During Therapy: [facility] for tasks assessed/performed  OT - Cognition Comments: able to follow 1 step commands consistently                 Following commands: Intact        Cueing   Cueing Techniques: Verbal cues             Pertinent Vitals/ Pain       Pain Assessment Pain Assessment: No/denies  pain   Frequency  Min 2X/week        Progress Toward Goals  OT Goals(current goals can now be found in the care plan section)  Progress towards OT goals: Progressing toward goals  Acute Rehab OT Goals OT Goal Formulation: With patient Time For Goal Achievement: 11/05/23 Potential to Achieve Goals: Good  Plan         AM-PAC OT 6 Clicks Daily Activity     Outcome Measure   Help from another person eating meals?: None Help from another person taking care of personal grooming?: A Little Help from another person toileting, which includes using toliet, bedpan, or urinal?: A Lot Help from another person bathing (including washing, rinsing, drying)?: A Lot Help from another person to put on and taking off regular upper body clothing?: A Little Help from another person to put on and taking off regular lower body clothing?: A Lot 6 Click Score: 16    End of Session Equipment Utilized During Treatment: Rolling walker (2 wheels)  OT Visit Diagnosis: Unsteadiness on feet (R26.81);Other abnormalities of gait and mobility (R26.89);Muscle weakness (generalized) (M62.81);Pain   Activity Tolerance Patient tolerated treatment well   Patient Left in chair;with call bell/phone within reach;with chair alarm set   Nurse Communication Mobility status        Time: 1431-1459 OT Time Calculation (min): 28 min  Charges: OT General Charges $OT Visit: 1 Visit OT Treatments $Self Care/Home Management : 8-22 mins $Therapeutic Activity: 8-22 mins     Delanna JINNY Lesches, OTR/L 10/21/2023, 4:24 PM

## 2023-10-22 DIAGNOSIS — F0390 Unspecified dementia without behavioral disturbance: Secondary | ICD-10-CM | POA: Diagnosis not present

## 2023-10-22 DIAGNOSIS — K56609 Unspecified intestinal obstruction, unspecified as to partial versus complete obstruction: Secondary | ICD-10-CM | POA: Diagnosis not present

## 2023-10-22 LAB — CBC WITH DIFFERENTIAL/PLATELET
Abs Immature Granulocytes: 0.42 K/uL — ABNORMAL HIGH (ref 0.00–0.07)
Basophils Absolute: 0.1 K/uL (ref 0.0–0.1)
Basophils Relative: 1 %
Eosinophils Absolute: 0.1 K/uL (ref 0.0–0.5)
Eosinophils Relative: 2 %
HCT: 25.3 % — ABNORMAL LOW (ref 36.0–46.0)
Hemoglobin: 7.7 g/dL — ABNORMAL LOW (ref 12.0–15.0)
Immature Granulocytes: 6 %
Lymphocytes Relative: 22 %
Lymphs Abs: 1.7 K/uL (ref 0.7–4.0)
MCH: 31.6 pg (ref 26.0–34.0)
MCHC: 30.4 g/dL (ref 30.0–36.0)
MCV: 103.7 fL — ABNORMAL HIGH (ref 80.0–100.0)
Monocytes Absolute: 0.6 K/uL (ref 0.1–1.0)
Monocytes Relative: 7 %
Neutro Abs: 4.8 K/uL (ref 1.7–7.7)
Neutrophils Relative %: 62 %
Platelets: 426 K/uL — ABNORMAL HIGH (ref 150–400)
RBC: 2.44 MIL/uL — ABNORMAL LOW (ref 3.87–5.11)
RDW: 16.7 % — ABNORMAL HIGH (ref 11.5–15.5)
Smear Review: NORMAL
WBC: 7.6 K/uL (ref 4.0–10.5)
nRBC: 0.4 % — ABNORMAL HIGH (ref 0.0–0.2)

## 2023-10-22 LAB — BASIC METABOLIC PANEL WITH GFR
Anion gap: 9 (ref 5–15)
BUN: 38 mg/dL — ABNORMAL HIGH (ref 8–23)
CO2: 18 mmol/L — ABNORMAL LOW (ref 22–32)
Calcium: 10 mg/dL (ref 8.9–10.3)
Chloride: 112 mmol/L — ABNORMAL HIGH (ref 98–111)
Creatinine, Ser: 0.64 mg/dL (ref 0.44–1.00)
GFR, Estimated: >60 mL/min (ref >60)
Glucose, Bld: 111 mg/dL — ABNORMAL HIGH (ref 70–99)
Potassium: 4.2 mmol/L (ref 3.5–5.1)
Sodium: 138 mmol/L (ref 135–145)

## 2023-10-22 LAB — PHOSPHORUS: Phosphorus: 2.5 mg/dL (ref 2.5–4.6)

## 2023-10-22 LAB — MAGNESIUM: Magnesium: 2.2 mg/dL (ref 1.7–2.4)

## 2023-10-22 LAB — HEPARIN LEVEL (UNFRACTIONATED): Heparin Unfractionated: 0.44 [IU]/mL (ref 0.30–0.70)

## 2023-10-22 MED ORDER — TRAVASOL 10 % IV SOLN
INTRAVENOUS | Status: AC
  Filled 2023-10-22: qty 889.2

## 2023-10-22 NOTE — Progress Notes (Deleted)
 Cardiology Office Note:    Date:  10/22/2023   ID:  Karrine, Kluttz 28-May-1940, MRN [id number]  PCP:  Mast, Man X, NP   [facility] Providers Cardiologist:  None { Click to update primary MD,subspecialty MD or APP then REFRESH:1}    Referring MD: Mast, Man X, NP   No chief complaint on file. ***  History of Present Illness:    Tracey [name] is a 83 y.o. female is self referred for evaluation of dyspnea on exertion. History of HTN.   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, possible    Tremors possible parkinsons   Manic disorder, single episode, in full remission 12/01/2009   Sixth nerve palsy     Past Surgical History:  Procedure Laterality Date   BILATERAL SALPINGOOPHORECTOMY  09/22/2007   CATARACT EXTRACTION Bilateral    ESOPHAGOGASTRODUODENOSCOPY (EGD) WITH PROPOFOL  N/A 03/25/2022   Procedure: ESOPHAGOGASTRODUODENOSCOPY (EGD) WITH PROPOFOL ;  Surgeon: Legrand Victory LITTIE DOUGLAS, MD;  Location: [facility];  Service: Gastroenterology;  Laterality: N/A;   Gastrointestinal Stroma Tumor,  Other  1960   GIST Surgery   ILEOCECETOMY  09/22/2007   LAPAROSCOPIC LYSIS OF ADHESIONS N/A 10/07/2023   Procedure: LYSIS, ADHESIONS, LAPAROSCOPIC;  Surgeon: Dasie Leonor LITTIE, MD;  Location: [facility];  Service: General;  Laterality: N/A;   LAPAROSCOPY N/A 10/07/2023   Procedure: DIAGNOSTIC LAPAROSCOPY, EXPLORATORY LAPAROTOMY WITH LYSIS  OF ADHESIONS, REPAIR OF SMALL BOWEL ENTEROTOMY;  Surgeon: Dasie Leonor LITTIE, MD;  Location: [facility];  Service: General;  Laterality: N/A;   OVARIAN CYST REMOVAL Right 1967   SMALL INTESTINE SURGERY  09/22/2007   TOTAL KNEE ARTHROPLASTY Right 10/05/2019   Procedure: RIGHT TOTAL KNEE ARTHROPLASTY;  Surgeon: Addie Cordella Hamilton, MD;  Location: [facility];  Service: Orthopedics;  Laterality: Right;   TOTAL VAGINAL HYSTERECTOMY  1986   Fibroids    Current Medications: No outpatient medications have been marked as taking for the 10/24/23 encounter (Appointment) with [place], Izola Teague M, MD.     Allergies:   Lisinopril, Penicillins, Adhesive [tape], Atorvastatin, and Dilaudid  [hydromorphone  hcl]   Social History   Socioeconomic History   Marital status: Widowed    Spouse name: Not on file   Number of children: 2   Years of education: Bachelors   Highest education level: Not on file  Occupational History   Occupation: retired    Comment: made wedding dressings  Tobacco Use   Smoking status: Never    Passive exposure: Never   Smokeless tobacco: Never  Vaping Use   Vaping status: Never Used  Substance and Sexual Activity   Alcohol  use: No   Drug use: No   Sexual activity: Not Currently    Birth control/protection: Post-menopausal  Other Topics Concern   Not on file  Social History Narrative   Lives at home alone.   Right-handed.   No caffeine use.  Diet: Eats anything, like fruits, vegetables, regular diet      Do you drink/ eat things with caffeine? No      Marital status:  Widowed                             What year were you married ? 1965      Do you live in a house, apartment,assistred living, condo, trailer, etc.)? Apartment      Is it one or more stories?  One Story      How many persons live in your home ? 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  No                            Type & how often       ADVANCED DIRECTIVES (Please bring copies)      Do you have a living will? Yes      Do you have a DNR form? Yes                      If not, do you want to discuss one?       Do you have signed POA?HPOA forms?  Yes               If so, please bring to your appointment      FUNCTIONAL STATUS- To be completed by Spouse / child / Staff       Do you have difficulty bathing or dressing yourself ? No   Do you have difficulty preparing food or eating ? No      Do you have difficulty managing your mediation ? No      Do you have difficulty managing your finances ? No      Do you have difficulty affording your medication ? No      Social Drivers of Corporate investment banker Strain: Not on file  Food Insecurity: No Food Insecurity (10/02/2023)   Hunger Vital Sign    Worried About Running Out of Food in the Last Year: Never true    Ran Out of Food in the Last Year: Never true  Transportation Needs: No Transportation Needs (10/02/2023)   PRAPARE - Administrator, Civil Service (Medical): No    Lack of Transportation (Non-Medical): No  Physical Activity: Not on file  Stress: Not on file  Social Connections: Moderately Integrated (10/02/2023)   Social Connection and Isolation Panel    Frequency of Communication with Friends and Family: Twice a week    Frequency of Social Gatherings with Friends and Family: Once a week    Attends Religious Services: 1 to 4 times per year    Active Member of [employer] or Organizations: Yes    Attends Banker Meetings: 1 to 4 times per year    Marital Status: Widowed     Family History: The patient's ***family history includes Congestive Heart Failure in her mother; Dementia in her mother; Diabetes in her father; Heart disease in her father; Lung cancer in her son. There is no history of Liver disease, Esophageal cancer, or  Colon  cancer.  ROS:   Please see the history of present illness.    *** All other systems reviewed and are negative.  EKGs/Labs/Other Studies Reviewed:    The following studies were reviewed today: Echo 10/04/23: IMPRESSIONS     1. Left ventricular ejection fraction, by estimation, is 60 to 65%. Left  ventricular ejection fraction by 2D MOD biplane is 60.0 %. The left  ventricle has normal function. The left ventricle has no regional wall  motion abnormalities. There is mild left  ventricular hypertrophy. Left ventricular diastolic function could not be  evaluated.   2. Right ventricular systolic function is normal. The right ventricular  size is normal. There is normal pulmonary artery systolic pressure. The  estimated right ventricular systolic pressure is 26.6 mmHg.   3. The mitral valve is grossly normal. No evidence of mitral valve  regurgitation.   4. The aortic valve is tricuspid. Aortic valve regurgitation is not  visualized. Aortic valve sclerosis is present, with no evidence of aortic  valve stenosis.   5. The inferior vena cava is normal in size with greater than 50%  respiratory variability, suggesting right atrial pressure of 3 mmHg.   Comparison(s): No prior Echocardiogram.        Recent Labs: 08/06/2023: B Natriuretic Peptide 67.4 10/04/2023: Pro Brain Natriuretic Peptide 529.0 10/05/2023: TSH 1.150 10/20/2023: ALT 9 10/22/2023: BUN 38; Creatinine, Ser 0.64; Hemoglobin 7.7; Magnesium  2.2; Platelets 426; Potassium 4.2; Sodium 138  Recent Lipid Panel    Component Value Date/Time   CHOL 212 (H) 07/29/2023 0711   TRIG 65 10/20/2023 0308   HDL 62 07/29/2023 0711   CHOLHDL 3.4 07/29/2023 0711   LDLCALC 121 (H) 07/29/2023 0711     Risk Assessment/Calculations:   {Does this patient have ATRIAL FIBRILLATION?:[id number]}            Physical Exam:    VS:  There were no vitals taken for this visit.    Wt Readings from Last 3 Encounters:  10/22/23 125 lb 14.1 oz  (57.1 kg)  10/01/23 115 lb 3.2 oz (52.3 kg)  08/26/23 112 lb 3.2 oz (50.9 kg)     GEN: *** Well nourished, well developed in no acute distress HEENT: Normal NECK: No JVD; No carotid bruits LYMPHATICS: No lymphadenopathy CARDIAC: ***RRR, no murmurs, rubs, gallops RESPIRATORY:  Clear to auscultation without rales, wheezing or rhonchi  ABDOMEN: Soft, non-tender, non-distended MUSCULOSKELETAL:  No edema; No deformity  SKIN: Warm and dry NEUROLOGIC:  Alert and oriented x 3 PSYCHIATRIC:  Normal affect   ASSESSMENT:    No diagnosis found. PLAN:    In order of problems listed above:  ***      {Are you ordering a CV Procedure (e.g. stress test, cath, DCCV, TEE, etc)?   Press F2        :[id number]}    Medication Adjustments/Labs and Tests Ordered: Current medicines are reviewed at length with the patient today.  Concerns regarding medicines are outlined above.  No orders of the defined types were placed in this encounter.  No orders of the defined types were placed in this encounter.   There are no Patient Instructions on file for this visit.   Signed, Shalik Sanfilippo [place], MD  10/22/2023 1:15 PM    [facility]

## 2023-10-22 NOTE — Progress Notes (Signed)
 15 Days Post-Op   Subjective/Chief Complaint: No complaints. Wants to go home since it is her birthday today   Objective: Vital signs in last 24 hours: Temp:  [98.2 F (36.8 C)-98.8 F (37.1 C)] 98.8 F (37.1 C) (10/08 0510) Pulse Rate:  [59-72] 70 (10/08 0510) Resp:  [18-20] 20 (10/07 2035) BP: (82-117)/(63-75) 111/64 (10/08 0510) SpO2:  [98 %-100 %] 100 % (10/08 0510) Weight:  [57.1 kg] 57.1 kg (10/08 0500) Last BM Date : 10/18/23  Intake/Output from previous day: 10/07 0701 - 10/08 0700 In: 2844.6 [P.O.:480; I.V.:1870.1; IV Piggyback:494.4] Out: 2450 [Urine:2450] Intake/Output this shift: Total I/O In: 659.9 [P.O.:590; I.V.:69.9] Out: 1050 [Urine:1050]  General appearance: alert and cooperative Resp: clear to auscultation bilaterally Cardio: regular rate and rhythm GI: soft, minimal tenderness  Lab Results:  Recent Labs    10/21/23 0305 10/22/23 0435  WBC 8.5 7.6  HGB 7.8* 7.7*  HCT 26.9* 25.3*  PLT 456* 426*   BMET Recent Labs    10/20/23 0308 10/22/23 0435  NA 137 138  K 4.0 4.2  CL 115* 112*  CO2 16* 18*  GLUCOSE 123* 111*  BUN 40* 38*  CREATININE 0.64 0.64  CALCIUM 9.6 10.0   PT/INR No results for input(s): LABPROT, INR in the last 72 hours. ABG No results for input(s): PHART, HCO3 in the last 72 hours.  Invalid input(s): PCO2, PO2  Studies/Results: No results found.  Anti-infectives: Anti-infectives (From admission, onward)    Start     Dose/Rate Route Frequency Ordered Stop   10/14/23 0930  ceFEPIme (MAXIPIME) 2 g in sodium chloride  0.9 % 100 mL IVPB        2 g 200 mL/hr over 30 Minutes Intravenous Every 12 hours 10/14/23 0820     10/14/23 0900  metroNIDAZOLE (FLAGYL) IVPB 500 mg        500 mg 100 mL/hr over 60 Minutes Intravenous 2 times daily 10/14/23 0811     10/07/23 0915  cefoTEtan  (CEFOTAN ) 2 g in sodium chloride  0.9 % 100 mL IVPB        2 g 200 mL/hr over 30 Minutes Intravenous On call to O.R. 10/07/23 0820  10/07/23 1938       Assessment/Plan: s/p Procedure(s): DIAGNOSTIC LAPAROSCOPY, EXPLORATORY LAPAROTOMY WITH LYSIS OF ADHESIONS, REPAIR OF SMALL BOWEL ENTEROTOMY (N/A) LYSIS, ADHESIONS, LAPAROSCOPIC (N/A) Advance diet. Try soft foods today SBO POD 15 s/p dx lap converted to ex lap with LOA and repair of enterotomy, Dr. Dasie 9/23. - CT AP 9/29 reviewed independently and with IR - pelvic collection does appear to be developing and seems to communicate with other small collections but is too early to drain - CT repeated 10/2 showed intra-abdominal collection is smaller, but it does appear more mature/thick-walled. Clinically the patient has a resolving ileus and her leukocytosis has resolved. Discussed with IR, will hold off on drainage since collection is smaller and patient improving, if there is a clinical change they can re-consider drainage. - Afebrile. - WBC 8.5, Hgb 7.8 (stable) - Having flatus. No n/v. - Continue TPN. Encouraged PO intake. Calorie count ongoing. - Will continue to follow.     FEN: TPN, FLD VTE: Heparin  infusion ID: Cefepime/Metronidazole   - Per TRH - Dementia  GERD Tremors  HTN  Anxiety  A fib RVR - on amio gtt, heparin  gtt  Anemia  LOS: 21 days    Deward Null III 10/22/2023

## 2023-10-22 NOTE — Progress Notes (Signed)
 PHARMACY - TOTAL PARENTERAL NUTRITION CONSULT NOTE   Indication: Small bowel obstruction and likely post-op ileus  Patient Measurements: Height: 5' 2 (157.5 cm) Weight: 57.1 kg (125 lb 14.1 oz) IBW/kg (Calculated) : 50.1 TPN AdjBW (KG): 50.2 Body mass index is 23.02 kg/m.  Assessment:  83 YO female presenting 9/17 with abdominal pain and distension. Initial CT A/P showed high-grade small bowel obstruction with transition point in the pelvis to the right of the midline and mild mesenteric edema; repeat CT continued to show ongoing dilated small bowel loops. Patient taken to OR 9/23 for diagnostic laparoscopy with LOA and repair of prior small bowel enterotomy. NGT in place. Pharmacy consulted for TPN management in the setting of SBO.   Glucose / Insulin : No Hx DM - CBGs well controlled (goal 100-150) on goal rate TPN. CBG/s and SSI stopped 10/2  Electrolytes: Cl still slightly elevated at 112 but improved, Bicarb low at 18 (on Na bicarb 650 mg PO TID) , CorrCa elevated at 11.12  (none in TPN), K+./Mg/Phos WNL - goal for ileus and afib: K >4 and Mag >2 (resolved)  Renal: BUN 38  elevated; SCr stable WNL <1  Hepatic: : albumin  2.6 down;  AST low, Tbili <0.2 - TG 199 elevated (9/29 labs)>>down to 65 (10/6)  I/O:  - UOP: 2450 mL - mIVF: none currently - po: 480 FLD (Boost x 3, no Ensure last 24h) - LBM: 10/4, ileus resolving/resolved  GI Imaging: - 9/17 a/p: High-grade small bowel obstruction with transition point in the pelvis to the right of midline - 9/19 DG abd: SBO pattern with no improvement since the prior CT - 9/21 CT chest/a/p: Small bowel loops remain dilated in the abdomen and pelvis. There is some diluted contrast in the dilated small bowel loops, but no discernible contrast in the colonic lumen. The degree of small-bowel dilatation appears stable to mildly progressive in the interval - 9/23 AXR: persistent SBO with mild improvement - 9/24 AXR: some decompression of SB in  upper abdomen noted - 9/29 CT a/p: persistent SBO with transition point in RLQ, signs of colitis and basilar atelectasis.  - 10/2 abd CT: nterval resolution of previously seen bowel obstruction.  wall collection - postoperative seroma or developing abscess?  GI Surgeries / Procedures:  - 9/23: Exploratory laparotomy with lysis of adhesions, primary repair of small bowel enterotomy  Central access: PICC obtained 9/23 TPN start date: 9/24  Nutritional Goals: Goal TPN rate is 65 mL/hr (provides 89 g of protein and 1619 kcals per day)  - 10/3: *Noted TPN tubing broken. TPN off with D10W since early this AM infusing - 10/4: on full liquid diet  RD Assessment: Estimated Needs Total Energy Estimated Needs: 1500-1700 kcals Total Protein Estimated Needs: 75-90 grams Total Fluid Estimated Needs: >/= 1.5L Calorie count 10/5: Patient unlikely to meet needs with full liquids without supplements   Current Nutrition:  NPO and TPN  Plan:  Today, at 1800: Continue TPN at goal rate of 65 mL/hr Electrolytes in TPN:  Na 20 mEq/L K 40 mEq/L Ca 0 mEq/L Mg 5 mEq/L Phos 20 mmol/L Cl:Ac max Ac  Add standard MVI and trace elements to TPN 10/2: D/c SSI and CBGs Further mIVF per MD (none currently) Monitor TPN labs on Mon/Thurs, and PRN   Sabir Charters Karoline Marina, PharmD, BCPS Clinical Staff Pharmacist 10/22/2023 7:53 AM

## 2023-10-22 NOTE — Progress Notes (Signed)
 Physical Therapy Treatment Patient Details Name: Tracey [name] MRN: [id number] DOB: July 16, 1940 Today's Date: 10/22/2023   History of Present Illness Patient is an 83 yo female admitted with SBO. On 10/07/2023 pt s/p diagnostic laparoscopy with LOA and repair of prior small bowel enterotomy. PMH: cognitive impairment, bipolar disorder, possible Parkinsons disease and h/o rection of GIST tumor, glaucoma, HTN    PT Comments  POD # 15 PT - Cognition Comments: AxO x 2 Hx Dementia but following all commands and able to express her needs.  Retired Environmental manager.  Resides at [facility] ALF Assisted OOB was difficult.  General bed mobility comments: HOB elevated with Min Assist for upper body and Mod Assist to complete scooting as well as increased time to complete.  No c/o ABD pain just a little sore.  General transfer comment: assisted from elevated bed to [facility] and increased assist from [facility] with Max VC's on proper hand placement and turn completion.  Assisted with peri care.  Pt present with increased fatigue with increased activity.  Unsteady with turns. General Gait Details: slight trunk flexion, min cues for safety, posture and RW management slow gait with slight unsteadiness with turns and back steps. Prior to admit, Pt was amb IND with her Rollator great distances for example to and from the dinning room.  LPT has rec Pt will need ST Rehab at SNF to address mobility and functional decline prior to safely returning home.    If plan is discharge home, recommend the following: A little help with walking and/or transfers;A little help with bathing/dressing/bathroom;Assistance with cooking/housework;Assist for transportation   Can travel by private vehicle     Yes  Equipment Recommendations       Recommendations for Other Services       Precautions / Restrictions Precautions Precautions: Fall Precaution/Restrictions Comments: recent LAP, TPN Restrictions Weight Bearing  Restrictions Per Provider Order: No     Mobility  Bed Mobility Overal bed mobility: Needs Assistance Bed Mobility: Supine to Sit     Supine to sit: Min assist, Mod assist     General bed mobility comments: HOB elevated with Min Assist for upper body and Mod Assist to complete scooting as well as increased time to complete.  No c/o ABD pain just a little sore.    Transfers Overall transfer level: Needs assistance Equipment used: Rolling walker (2 wheels) Transfers: Sit to/from Stand Sit to Stand: Min assist, Mod assist           General transfer comment: assisted from elevated bed to [facility] and increased assist from [facility] with Max VC's on proper hand placement and turn completion.  Assisted with peri care.  Pt present with increased fatigue with increased activity.  Unsteady with turns.    Ambulation/Gait Ambulation/Gait assistance: Contact guard assist, Min assist Gait Distance (Feet): 74 Feet Assistive device: Rolling walker (2 wheels) Gait Pattern/deviations: Trunk flexed, Step-through pattern Gait velocity: decreased     General Gait Details: slight trunk flexion, min cues for safety, posture and RW management slow gait with slight unsteadiness with turns and back steps.   Stairs             Wheelchair Mobility     Tilt Bed    Modified Rankin (Stroke Patients Only)       Balance  Communication Communication Communication: No apparent difficulties  Cognition Arousal: Alert Behavior During Therapy: WFL for tasks assessed/performed   PT - Cognitive impairments: History of cognitive impairments                       PT - Cognition Comments: AxO x 2 Hx Dementia but following all commands and able to express her needs.  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 Following commands: Intact      Cueing Cueing Techniques: Verbal cues  Exercises      General  Comments        Pertinent Vitals/Pain Pain Assessment Pain Assessment: Faces Faces Pain Scale: Hurts a little bit Pain Location: ABD    Home Living                          Prior Function            PT Goals (current goals can now be found in the care plan section)      Frequency    Min 2X/week      PT Plan      Co-evaluation              AM-PAC PT 6 Clicks Mobility   Outcome Measure  Help needed turning from your back to your side while in a flat bed without using bedrails?: A Lot Help needed moving from lying on your back to sitting on the side of a flat bed without using bedrails?: A Lot Help needed moving to and from a bed to a chair (including a wheelchair)?: A Lot Help needed standing up from a chair using your arms (e.g., wheelchair or bedside chair)?: A Lot Help needed to walk in hospital room?: A Lot Help needed climbing 3-5 steps with a railing? : A Lot 6 Click Score: 12    End of Session Equipment Utilized During Treatment: Gait belt Activity Tolerance: Patient tolerated treatment well Patient left: in chair;with call bell/phone within reach;with chair alarm set;with nursing/sitter in room Nurse Communication: Mobility status PT Visit Diagnosis: Muscle weakness (generalized) (M62.81);Other abnormalities of gait and mobility (R26.89);Unsteadiness on feet (R26.81);Other symptoms and signs involving the nervous system (R29.898)     Time: 1129-1201 PT Time Calculation (min) (ACUTE ONLY): 32 min  Charges:    $Gait Training: 8-22 mins $Therapeutic Activity: 8-22 mins PT General Charges $$ ACUTE PT VISIT: 1 Visit                    Katheryn Leap  PTA Acute  Rehabilitation Services Office M-F          [phone]

## 2023-10-22 NOTE — Progress Notes (Signed)
  Progress Note   Patient: Tracey [name] FMW:[id number] DOB: [birth year] DOA: 10/01/2023     21 DOS: the patient was seen and examined on 10/22/2023   Brief hospital course:   Assessment and Plan: Small bowel obstruction Postoperative ileus -- resolved Failed conservative management, status post diagnostic and laparoscopic, lysis of adhesions, repair of small bowel enterotomy 9/23. Course complicated by prolonged ileus, possible colitis, intra-abdominal abscesses noted on CT 9/29 Continue antibiotics as per general surgery Continue management per general surgery including TPN and diet  Postoperative blood loss anemia Received IV iron  during hospitalization Monitoring H&H  Paroxysmal atrial fibrillation with rapid ventricular response In the setting of complicated postoperative course Continue amiodarone , heparin   Dementia without behavioral disturbance Stable  Acute urinary retention resolved  Severe malnutrition       Subjective:  Feels ok  Today is her birthdday  Physical Exam: Vitals:   10/21/23 2035 10/22/23 0500 10/22/23 0510 10/22/23 1238  BP: 111/63  111/64 (!) 119/54  Pulse:   70 (!) 58  Resp: 20   17  Temp:   98.8 F (37.1 C) 98.2 F (36.8 C)  TempSrc:   Oral Oral  SpO2:   100% 99%  Weight:  57.1 kg    Height:       Physical Exam Vitals reviewed.  Constitutional:      General: She is not in acute distress.    Appearance: She is not ill-appearing or toxic-appearing.  Cardiovascular:     Rate and Rhythm: Normal rate and regular rhythm.     Heart sounds: No murmur heard. Pulmonary:     Effort: Pulmonary effort is normal. No respiratory distress.     Breath sounds: No wheezing, rhonchi or rales.  Neurological:     Mental Status: She is alert.  Psychiatric:        Mood and Affect: Mood normal.     Data Reviewed: BMP stable, magnesium  and phosphorus within normal limits Hemoglobin stable at 7.7  Family Communication:  none  Disposition: Status is: Inpatient      Time spent: 20 minutes  Author: Toribio Door, MD 10/22/2023 7:25 PM  For on call review [email or web address].

## 2023-10-22 NOTE — Progress Notes (Signed)
 PHARMACY - ANTICOAGULATION CONSULT NOTE  Pharmacy Consult for Heparin  Indication: atrial fibrillation  Allergies  Allergen Reactions   Lisinopril Cough   Penicillins Itching and Other (See Comments)    50 years ago   Adhesive [Tape] Itching   Atorvastatin Itching   Dilaudid  [Hydromorphone  Hcl] Itching    Patient Measurements: Height: 5' 2 (157.5 cm) Weight: 57.1 kg (125 lb 14.1 oz) IBW/kg (Calculated) : 50.1 HEPARIN  DW (KG): 50.2  Vital Signs: Temp: 98.8 F (37.1 C) (10/08 0510) Temp Source: Oral (10/08 0510) BP: 111/64 (10/08 0510) Pulse Rate: 70 (10/08 0510)  Labs: Recent Labs    10/20/23 0308 10/21/23 0305 10/22/23 0435  HGB 7.1* 7.8* 7.7*  HCT 23.6* 26.9* 25.3*  PLT 402* 456* 426*  HEPARINUNFRC 0.46 0.53 0.44  CREATININE 0.64  --  0.64    Estimated Creatinine Clearance: 42.1 mL/min (by C-G formula based on SCr of 0.64 mg/dL).   Medical History: 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, possible    Tremors possible parkinsons   Manic disorder, single episode, in full remission 12/01/2009   Sixth nerve palsy    Assessment: AC/Heme: UFH per Rx for new Afib. CHADS2VASC: 4.   - Iron  49 low, TIBC 225 low, Ferritin 511 high, FA WNL, B12 high - Hep level: 0.44, Hgb 7.1>7.7,  Plts 426 high rising. Check stool hemmocult (no results yet). No bleeding noted  Goal of Therapy:  Heparin  level 0.3-0.7  units/ml Monitor platelets by anticoagulation protocol: Yes   Plan:  Con't IV heparin  900 units/hr Daily HL and CBC  Iyari Hagner Karoline Marina, PharmD, BCPS Clinical Staff Pharmacist [facility] 10/22/2023,7:52 AM

## 2023-10-23 ENCOUNTER — Inpatient Hospital Stay (HOSPITAL_COMMUNITY)

## 2023-10-23 DIAGNOSIS — K56609 Unspecified intestinal obstruction, unspecified as to partial versus complete obstruction: Secondary | ICD-10-CM | POA: Diagnosis not present

## 2023-10-23 DIAGNOSIS — D62 Acute posthemorrhagic anemia: Secondary | ICD-10-CM | POA: Diagnosis not present

## 2023-10-23 DIAGNOSIS — F0390 Unspecified dementia without behavioral disturbance: Secondary | ICD-10-CM | POA: Diagnosis not present

## 2023-10-23 DIAGNOSIS — I480 Paroxysmal atrial fibrillation: Secondary | ICD-10-CM

## 2023-10-23 LAB — COMPREHENSIVE METABOLIC PANEL WITH GFR
ALT: 11 U/L (ref 0–44)
AST: 17 U/L (ref 15–41)
Albumin: 3.2 g/dL — ABNORMAL LOW (ref 3.5–5.0)
Alkaline Phosphatase: 93 U/L (ref 38–126)
Anion gap: 9 (ref 5–15)
BUN: 39 mg/dL — ABNORMAL HIGH (ref 8–23)
CO2: 17 mmol/L — ABNORMAL LOW (ref 22–32)
Calcium: 10.1 mg/dL (ref 8.9–10.3)
Chloride: 108 mmol/L (ref 98–111)
Creatinine, Ser: 0.59 mg/dL (ref 0.44–1.00)
GFR, Estimated: >60 mL/min (ref >60)
Glucose, Bld: 117 mg/dL — ABNORMAL HIGH (ref 70–99)
Potassium: 4.2 mmol/L (ref 3.5–5.1)
Sodium: 134 mmol/L — ABNORMAL LOW (ref 135–145)
Total Bilirubin: 0.2 mg/dL (ref 0.0–1.2)
Total Protein: 5.8 g/dL — ABNORMAL LOW (ref 6.5–8.1)

## 2023-10-23 LAB — CBC WITH DIFFERENTIAL/PLATELET
Abs Immature Granulocytes: 0.61 K/uL — ABNORMAL HIGH (ref 0.00–0.07)
Basophils Absolute: 0.1 K/uL (ref 0.0–0.1)
Basophils Relative: 1 %
Eosinophils Absolute: 0.1 K/uL (ref 0.0–0.5)
Eosinophils Relative: 1 %
HCT: 27.8 % — ABNORMAL LOW (ref 36.0–46.0)
Hemoglobin: 8.2 g/dL — ABNORMAL LOW (ref 12.0–15.0)
Immature Granulocytes: 7 %
Lymphocytes Relative: 16 %
Lymphs Abs: 1.4 K/uL (ref 0.7–4.0)
MCH: 30.7 pg (ref 26.0–34.0)
MCHC: 29.5 g/dL — ABNORMAL LOW (ref 30.0–36.0)
MCV: 104.1 fL — ABNORMAL HIGH (ref 80.0–100.0)
Monocytes Absolute: 0.7 K/uL (ref 0.1–1.0)
Monocytes Relative: 8 %
Neutro Abs: 6.1 K/uL (ref 1.7–7.7)
Neutrophils Relative %: 67 %
Platelets: 426 K/uL — ABNORMAL HIGH (ref 150–400)
RBC: 2.67 MIL/uL — ABNORMAL LOW (ref 3.87–5.11)
RDW: 16.8 % — ABNORMAL HIGH (ref 11.5–15.5)
WBC: 9.1 K/uL (ref 4.0–10.5)
nRBC: 0.7 % — ABNORMAL HIGH (ref 0.0–0.2)

## 2023-10-23 LAB — GLUCOSE, CAPILLARY
Glucose-Capillary: 100 mg/dL — ABNORMAL HIGH (ref 70–99)
Glucose-Capillary: 108 mg/dL — ABNORMAL HIGH (ref 70–99)
Glucose-Capillary: 123 mg/dL — ABNORMAL HIGH (ref 70–99)
Glucose-Capillary: 127 mg/dL — ABNORMAL HIGH (ref 70–99)

## 2023-10-23 LAB — MAGNESIUM: Magnesium: 2.1 mg/dL (ref 1.7–2.4)

## 2023-10-23 LAB — PHOSPHORUS: Phosphorus: 2.9 mg/dL (ref 2.5–4.6)

## 2023-10-23 LAB — HEPARIN LEVEL (UNFRACTIONATED): Heparin Unfractionated: 0.51 [IU]/mL (ref 0.30–0.70)

## 2023-10-23 MED ORDER — ENSURE SURGERY PO LIQD
237.0000 mL | Freq: Three times a day (TID) | ORAL | Status: DC
  Administered 2023-10-23 – 2023-10-26 (×7): 237 mL via ORAL
  Filled 2023-10-23 (×15): qty 237

## 2023-10-23 MED ORDER — TRAVASOL 10 % IV SOLN
INTRAVENOUS | Status: DC
  Filled 2023-10-23: qty 889.2

## 2023-10-23 MED ORDER — INSULIN ASPART 100 UNIT/ML IJ SOLN
0.0000 [IU] | Freq: Three times a day (TID) | INTRAMUSCULAR | Status: DC
  Administered 2023-10-23 – 2023-10-27 (×6): 1 [IU] via SUBCUTANEOUS

## 2023-10-23 NOTE — Progress Notes (Signed)
 Patient ID: Tracey [name], female   DOB: [birth year], 83 y.o.   MRN: [id number] Patient throwing up profusely around 1500.Burnard Felt surgical PA notified. Order for xray and decompress with NG tube. NG tube placed. Patient removed NG tube. MD on call notified, Georgette Poli MD. Stated to place NG tube if vomits again. If restraints are needed after that then contact the medical team.  Melessa Cowell D Stephana Morell, RN

## 2023-10-23 NOTE — Progress Notes (Addendum)
 PHARMACY - TOTAL PARENTERAL NUTRITION CONSULT NOTE   Indication: Small bowel obstruction and likely post-op ileus  Patient Measurements: Height: 5' 2 (157.5 cm) Weight: (P) 57.6 kg (126 lb 15.8 oz) IBW/kg (Calculated) : 50.1 TPN AdjBW (KG): 50.2 Body mass index is 23.23 kg/m (pended).  Assessment:  83 YO female presenting 9/17 with abdominal pain and distension. Initial CT A/P showed high-grade small bowel obstruction with transition point in the pelvis to the right of the midline and mild mesenteric edema; repeat CT continued to show ongoing dilated small bowel loops. Patient taken to OR 9/23 for diagnostic laparoscopy with LOA and repair of prior small bowel enterotomy. NGT in place. Pharmacy consulted for TPN management in the setting of SBO.   Glucose / Insulin : No Hx DM - CBGs well controlled (goal 100-150) on goal rate TPN. CBG/s and SSI stopped 10/2 Electrolytes: Na low 134, Bicarb low at 17 (acetate maxed in TPN, also on Na bicarb 650 mg PO TID) , CorrCa elevated at 10.74 (none in TPN); other lytes wnl Renal: BUN 39 elevated; SCr stable WNL <1 Hepatic:  LFTs and Tbili WNL - albumin  low 3.2 - TG 199 elevated (9/29 labs), 65 (10/6) I/O: +16700 mL - UOP: 3000 mL - mIVF: none currently - LBM: 10/9 GI Imaging: - 9/17 a/p: High-grade small bowel obstruction with transition point in the pelvis to the right of midline - 9/19 DG abd: SBO pattern with no improvement since the prior CT - 9/21 CT chest/a/p: Small bowel loops remain dilated in the abdomen and pelvis. There is some diluted contrast in the dilated small bowel loops, but no discernible contrast in the colonic lumen. The degree of small-bowel dilatation appears stable to mildly progressive in the interval - 9/23 AXR: persistent SBO with mild improvement - 9/24 AXR: some decompression of SB in upper abdomen noted - 9/29 CT a/p: persistent SBO with transition point in RLQ, signs of colitis and basilar atelectasis.  - 10/2 abd  CT: nterval resolution of previously seen bowel obstruction.  wall collection - postoperative seroma or developing abscess?  GI Surgeries / Procedures:  - 9/23: Exploratory laparotomy with lysis of adhesions, primary repair of small bowel enterotomy  Central access: PICC obtained 9/23 TPN start date: 9/24  Nutritional Goals: Goal TPN rate is 65 mL/hr (provides 89 g of protein and 1619 kcals per day)  - 10/3: *Noted TPN tubing broken. TPN off with D10W since early this AM infusing - 10/4: on full liquid diet   - on calorie count   RD Assessment: Estimated Needs Total Energy Estimated Needs: 1500-1700 kcals Total Protein Estimated Needs: 75-90 grams Total Fluid Estimated Needs: >/= 1.5L  Current Nutrition:  NPO and TPN  Plan:   Today, at 1800: Continue TPN at goal rate of 65 mL/hr Electrolytes in TPN:  Increase Na to 50 mEq/L K 40 mEq/L Ca 0 mEq/L Mg 5 mEq/L Phos 20 mmol/L Cl:Ac max Ac  Add standard MVI and trace elements to TPN 10/2: D/c SSI and CBGs Further mIVF per MD (none currently) Bmet on 10/10 Monitor TPN labs on Mon/Thurs, and PRN   Iantha Batch, PharmD, BCPS 10/23/2023 7:09 AM  _____________________________  Adden: per Burnard Louder via [place] msg, d/c TPN when current bag runs out at 6pm today - pharmacy will sign off.  Iantha Batch, PharmD, BCPS 10/23/2023 11:28 AM

## 2023-10-23 NOTE — Progress Notes (Signed)
 Occupational Therapy Treatment Patient Details Name: Tracey [name] MRN: [id number] DOB: 11/24/1940 Today's Date: 10/23/2023   History of present illness Patient is an 83 yo female admitted with SBO. On 10/07/2023 pt s/p diagnostic laparoscopy with LOA and repair of prior small bowel enterotomy. PMH: cognitive impairment, bipolar disorder, possible Parkinsons disease and h/o rection of GIST tumor, glaucoma, HTN   OT comments  Pt received sitting in recliner, agreeable to session. Upon standing, pt noted to have large incontinent BM. Pt requires MIN A for squat pivot to BSC, MAX A for clothing mgmt and hygiene in standing with UE support on RW, MAX A to don gown in standing and MIN A to take pivotal steps with RW back to bed. MIN A for LE mgmt using log roll technique to return to supine. RN notified of pt's request for pain meds 2/2 HA and drainage noted on abdominal dressing. Pt making gradual progress towards goals, OT will follow. Discharge recommendation appropriate.       If plan is discharge home, recommend the following:  A lot of help with bathing/dressing/bathroom;Assistance with cooking/housework;A little help with walking and/or transfers   Equipment Recommendations  Other (comment)    Recommendations for Other Services      Precautions / Restrictions Precautions Precautions: Fall Recall of Precautions/Restrictions: Impaired Precaution/Restrictions Comments: recent LAP, TPN Restrictions Weight Bearing Restrictions Per Provider Order: No Other Position/Activity Restrictions: abdominal surgery       Mobility Bed Mobility Overal bed mobility: Needs Assistance Bed Mobility: Sit to Sidelying, Rolling Rolling: Supervision       Sit to sidelying: Min assist, HOB elevated, Used rails General bed mobility comments: assist for BLE to return to bed    Transfers Overall transfer level: Needs assistance Equipment used: Rolling walker (2 wheels) Transfers: Sit to/from Stand,  Bed to chair/wheelchair/BSC Sit to Stand: Min assist     Step pivot transfers: Min assist     General transfer comment: STS MIN A using RW for support, cues for AD proximity     Balance Overall balance assessment: Needs assistance Sitting-balance support: Feet supported Sitting balance-Leahy Scale: Good Sitting balance - Comments: static sitting-good. dynamic sitting-fair+   Standing balance support: Reliant on assistive device for balance, During functional activity, Bilateral upper extremity supported Standing balance-Leahy Scale: Poor                             ADL either performed or assessed with clinical judgement   ADL Overall ADL's : Needs assistance/impaired                         Toilet Transfer: Squat-pivot;BSC/3in1;Cueing for safety;Cueing for sequencing;Minimal assistance;Rolling walker (2 wheels) Toilet Transfer Details (indicate cue type and reason): squat pivot recliner > BSC, noted to have incontinent BM while seated in recliner and unaware. stands for extended period for pericare using RW Toileting- Architect and Hygiene: Maximal assistance;Sit to/from stand Toileting - Clothing Manipulation Details (indicate cue type and reason): stands for 5 mins for pericare     Functional mobility during ADLs: Minimal assistance;Rolling walker (2 wheels) General ADL Comments: recliner > BSC > bed, cues for hand placement and general technique     Communication Communication Communication: No apparent difficulties   Cognition Arousal: Alert Behavior During Therapy: Flat affect               OT - Cognition Comments: slow processing, flat affect  Following commands: Intact        Cueing   Cueing Techniques: Verbal cues        General Comments Pt with HA; RN notified. Abdominal staples intact, drainage on padding with RN made aware    Pertinent Vitals/ Pain       Pain Assessment Pain Assessment:  Faces Faces Pain Scale: Hurts little more Pain Location: HA Pain Descriptors / Indicators: Headache Pain Intervention(s): Limited activity within patient's tolerance, Patient requesting pain meds-RN notified         Frequency  Min 2X/week        Progress Toward Goals  OT Goals(current goals can now be found in the care plan section)  Progress towards OT goals: Progressing toward goals  Acute Rehab OT Goals OT Goal Formulation: With patient Time For Goal Achievement: 11/05/23 Potential to Achieve Goals: Good ADL Goals Pt Will Perform Upper Body Dressing: with set-up;sitting Pt Will Perform Lower Body Dressing: with contact guard assist;sitting/lateral leans;sit to/from stand Pt Will Transfer to Toilet: with contact guard assist;ambulating Pt Will Perform Toileting - Clothing Manipulation and hygiene: with contact guard assist;sit to/from stand  Plan         AM-PAC OT 6 Clicks Daily Activity     Outcome Measure   Help from another person eating meals?: None Help from another person taking care of personal grooming?: A Little Help from another person toileting, which includes using toliet, bedpan, or urinal?: A Lot Help from another person bathing (including washing, rinsing, drying)?: A Lot Help from another person to put on and taking off regular upper body clothing?: A Little Help from another person to put on and taking off regular lower body clothing?: A Lot 6 Click Score: 16    End of Session Equipment Utilized During Treatment: Rolling walker (2 wheels)  OT Visit Diagnosis: Unsteadiness on feet (R26.81);Other abnormalities of gait and mobility (R26.89);Muscle weakness (generalized) (M62.81);Pain   Activity Tolerance Patient tolerated treatment well   Patient Left in bed;with call bell/phone within reach;with bed alarm set   Nurse Communication Mobility status;Patient requests pain meds        Time: 8592-8568 OT Time Calculation (min): 24  min  Charges: OT General Charges $OT Visit: 1 Visit OT Treatments $Self Care/Home Management : 23-37 mins  Jarry Manon L. Gavan Nordby, OTR/L  10/23/23, 3:42 PM

## 2023-10-23 NOTE — Progress Notes (Signed)
 Patient ID: Tracey [name], female   DOB: 06-Oct-1940, 83 y.o.   MRN: [id number]  Attempted to remove staples from abdominal incision and apply steri strips but patient began vomiting profusely. Will attempt again later.  Verdie JONETTA Collier, RN

## 2023-10-23 NOTE — Progress Notes (Addendum)
 PHARMACY - ANTICOAGULATION CONSULT NOTE  Pharmacy Consult for heparin   Indication: atrial fibrillation  Allergies  Allergen Reactions   Lisinopril Cough   Penicillins Itching and Other (See Comments)    50 years ago   Adhesive [Tape] Itching   Atorvastatin Itching   Dilaudid  [Hydromorphone  Hcl] Itching    Patient Measurements: Height: 5' 2 (157.5 cm) Weight: (P) 57.6 kg (126 lb 15.8 oz) IBW/kg (Calculated) : 50.1 HEPARIN  DW (KG): 50.2  Vital Signs: Temp: 98.1 F (36.7 C) (10/09 0418) Temp Source: Oral (10/09 0418) BP: 132/63 (10/09 0418) Pulse Rate: 66 (10/09 0418)  Labs: Recent Labs    10/21/23 0305 10/22/23 0435 10/23/23 0106  HGB 7.8* 7.7* 8.2*  HCT 26.9* 25.3* 27.8*  PLT 456* 426* 426*  HEPARINUNFRC 0.53 0.44 0.51  CREATININE  --  0.64 0.59    Estimated Creatinine Clearance: 42.1 mL/min (by C-G formula based on SCr of 0.59 mg/dL).   Medical History: 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, possible    Tremors possible parkinsons   Manic disorder, single episode, in full remission 12/01/2009   Sixth nerve palsy      Assessment: Patient is an 83 y.o F who presented the ED on 10/01/23 with c/o constipation, abdominal pain and n/v.  She was subsequently found to have high-grade SBO and underwent exploratory laparotomy with lysis of adhesions and repair of small bowel enterotomy on 10/07/23.  She is currently  on heparin  drip for afib.  Today, 10/23/2023: - heparin  level is therapeutic at 0.51 - hgb low but stable, plts 426K - no bleeding documented  Goal of Therapy:  Heparin  level 0.3-0.7 units/ml Monitor platelets by anticoagulation protocol: Yes   Plan:  - continue heparin  drip at 900 units/hr - daily heparin  level and cbc - monitor for s/sx bleeding   Devanny Palecek P 10/23/2023,7:07 AM

## 2023-10-23 NOTE — Progress Notes (Signed)
  Progress Note   Patient: Tracey [name] FMW:[id number] DOB: May 21, 1940 DOA: 10/01/2023     22 DOS: the patient was seen and examined on 10/23/2023   Brief hospital course:   Assessment and Plan: Small bowel obstruction Postoperative ileus  Failed conservative management, status post diagnostic and laparoscopic, lysis of adhesions, repair of small bowel enterotomy 9/23. Course complicated by prolonged ileus, possible colitis, intra-abdominal abscesses noted on CT 9/29 Continue antibiotics as per general surgery Continue management per general surgery TPN stopped per surgery.  No has had some vomiting, NG tube placed but removed by patient.   Postoperative blood loss anemia Received IV iron  during hospitalization Hemoglobin stable.   Paroxysmal atrial fibrillation with rapid ventricular response In the setting of complicated postoperative course Continue amiodarone , heparin    Dementia without behavioral disturbance Stable   Acute urinary retention resolved   Severe malnutrition      Subjective:  Confused, wants to go home  Physical Exam: Vitals:   10/23/23 0418 10/23/23 0419 10/23/23 1314 10/23/23 1954  BP: 132/63  130/71 (!) 142/68  Pulse: 66  72 70  Resp: 17  18 17   Temp: 98.1 F (36.7 C)  98.2 F (36.8 C) 98 F (36.7 C)  TempSrc: Oral  Oral Oral  SpO2: 100%  100% 100%  Weight:  (P) 57.6 kg    Height:       Physical Exam Vitals reviewed.  Constitutional:      General: She is not in acute distress.    Appearance: She is not ill-appearing or toxic-appearing.  Cardiovascular:     Rate and Rhythm: Normal rate and regular rhythm.     Heart sounds: No murmur heard. Pulmonary:     Effort: Pulmonary effort is normal. No respiratory distress.     Breath sounds: No wheezing, rhonchi or rales.  Musculoskeletal:        General: No deformity.  Neurological:     Mental Status: She is alert.  Psychiatric:        Mood and Affect: Mood normal.        Behavior:  Behavior normal.     Data Reviewed: CBG stable CMP noted Hgb stable 8.2  Family Communication: none  Disposition: Status is: Inpatient      Time spent: 20 minutes  Author: Toribio Door, MD 10/23/2023 8:05 PM  For on call review [email or web address].

## 2023-10-23 NOTE — Progress Notes (Signed)
 Progress Note  16 Days Post-Op  Subjective: Pt a little confused about the day this AM but reoriented easily. She was working on breakfast and trying to increase PO intake. Reports she felt hungrier yesterday. Having bowel function.   Objective: Vital signs in last 24 hours: Temp:  [98.1 F (36.7 C)-98.2 F (36.8 C)] 98.1 F (36.7 C) (10/09 0418) Pulse Rate:  [58-73] 66 (10/09 0418) Resp:  [17-18] 17 (10/09 0418) BP: (119-148)/(54-78) 132/63 (10/09 0418) SpO2:  [98 %-100 %] 100 % (10/09 0418) Weight:  [57.6 kg] (P) 57.6 kg (10/09 0419) Last BM Date : 10/23/23  Intake/Output from previous day: 10/08 0701 - 10/09 0700 In: 3167.3 [P.O.:1070; I.V.:1697.2; IV Piggyback:400.1] Out: 3000 [Urine:3000] Intake/Output this shift: No intake/output data recorded.  PE: General: pleasant, WD, elderly female, NAD Heart: regular, rate, and rhythm.   Lungs: Respiratory effort nonlabored Abd: soft, NT, ND, incision clean with staples present     Lab Results:  Recent Labs    10/22/23 0435 10/23/23 0106  WBC 7.6 9.1  HGB 7.7* 8.2*  HCT 25.3* 27.8*  PLT 426* 426*   BMET Recent Labs    10/22/23 0435 10/23/23 0106  NA 138 134*  K 4.2 4.2  CL 112* 108  CO2 18* 17*  GLUCOSE 111* 117*  BUN 38* 39*  CREATININE 0.64 0.59  CALCIUM 10.0 10.1   PT/INR No results for input(s): LABPROT, INR in the last 72 hours. CMP     Component Value Date/Time   NA 134 (L) 10/23/2023 0106   NA 139 01/26/2020 0000   NA 141 04/24/2012 0929   K 4.2 10/23/2023 0106   K 4.4 04/24/2012 0929   CL 108 10/23/2023 0106   CL 109 (H) 04/24/2012 0929   CO2 17 (L) 10/23/2023 0106   CO2 23 04/24/2012 0929   GLUCOSE 117 (H) 10/23/2023 0106   GLUCOSE 85 04/24/2012 0929   BUN 39 (H) 10/23/2023 0106   BUN 19 01/26/2020 0000   BUN 20.6 04/24/2012 0929   CREATININE 0.59 10/23/2023 0106   CREATININE 0.79 04/18/2023 1524   CREATININE 0.9 04/24/2012 0929   CALCIUM 10.1 10/23/2023 0106   CALCIUM 10.5  (H) 01/12/2020 2123   CALCIUM 10.0 04/24/2012 0929   PROT 5.8 (L) 10/23/2023 0106   PROT 6.7 04/24/2012 0929   ALBUMIN  3.2 (L) 10/23/2023 0106   ALBUMIN  3.5 04/24/2012 0929   AST 17 10/23/2023 0106   AST 20 04/24/2012 0929   ALT 11 10/23/2023 0106   ALT 12 04/24/2012 0929   ALKPHOS 93 10/23/2023 0106   ALKPHOS 75 04/24/2012 0929   BILITOT 0.2 10/23/2023 0106   BILITOT 0.36 04/24/2012 0929   GFRNONAA >60 10/23/2023 0106   GFRNONAA 58 (L) 05/11/2020 0700   GFRAA 68 05/11/2020 0700   Lipase     Component Value Date/Time   LIPASE 22 09/18/2013 1810       Studies/Results: No results found.  Anti-infectives: Anti-infectives (From admission, onward)    Start     Dose/Rate Route Frequency Ordered Stop   10/14/23 0930  ceFEPIme (MAXIPIME) 2 g in sodium chloride  0.9 % 100 mL IVPB        2 g 200 mL/hr over 30 Minutes Intravenous Every 12 hours 10/14/23 0820     10/14/23 0900  metroNIDAZOLE (FLAGYL) IVPB 500 mg        500 mg 100 mL/hr over 60 Minutes Intravenous 2 times daily 10/14/23 0811     10/07/23 0915  cefoTEtan  (CEFOTAN )  2 g in sodium chloride  0.9 % 100 mL IVPB        2 g 200 mL/hr over 30 Minutes Intravenous On call to O.R. 10/07/23 0820 10/07/23 1938        Assessment/Plan SBO POD 16 s/p dx lap converted to ex lap with LOA and repair of enterotomy, Dr. Dasie 9/23. - CT AP 9/29 reviewed independently and with IR - pelvic collection does appear to be developing and seems to communicate with other small collections but is too early to drain - CT repeated 10/2 showed intra-abdominal collection is smaller, but it does appear more mature/thick-walled. Clinically the patient has a resolving ileus and her leukocytosis has resolved. Discussed with IR, will hold off on drainage since collection is smaller and patient improving, if there is a clinical change they can re-consider drainage. - Afebrile - WBC normalized 10/2, Hgb 8.2 stable - Having bowel function and tolerating  soft diet, working to increase PO intake. DC TPN after current bag and start calorie count  - recheck CBC in AM, will stop abx after today - no clinical indication to repeat CT at this time      FEN: soft diet, ensure, DC TPN and start cal count today  VTE: Heparin  infusion - maybe transition to PO tomorrow if WBC stable off abx ID: Cefepime/Metronidazole to stop after today to complete 10d course   - Per TRH - Dementia  GERD Tremors  HTN  Anxiety  A fib RVR - on amio gtt, heparin  gtt  Anemia    LOS: 22 days     Burnard JONELLE Louder, [facility] Surgery 10/23/2023, 10:26 AM Please see Amion for pager number during day hours 7:00am-4:30pm

## 2023-10-23 NOTE — Progress Notes (Signed)
 Mobility Specialist - Progress Note   10/23/23 0833  Mobility  Activity Ambulated with assistance  Level of Assistance Minimal assist, patient does 75% or more  Assistive Device Front wheel walker  Distance Ambulated (ft) 60 ft  Range of Motion/Exercises Active  Activity Response Tolerated well  Mobility Referral Yes  Mobility visit 1 Mobility  Mobility Specialist Start Time (ACUTE ONLY) 0815  Mobility Specialist Stop Time (ACUTE ONLY) [id number]  Mobility Specialist Time Calculation (min) (ACUTE ONLY) 18 min   Pt was found in bed and agreeable to mobilize. Min-A for bed mobility and CG for ambulation. Grew fatigued with session. At EOS returned to recliner chair with all needs met. Chair alarm on and NT in room.   Erminio Leos,  Mobility Specialist Can be reached via Secure Chat

## 2023-10-23 NOTE — Progress Notes (Signed)
 Nutrition Follow-up  DOCUMENTATION CODES:   Severe malnutrition in context of chronic illness  INTERVENTION:  - 72 hour calorie count to start today (10/9) and run through 10/11.  - Please document % intake of all foods, drinks, and nutrition supplements patient consumes on meal tickets and place in envelope on patient's door.  - If patient skips/refuses meals please document 0% for that meal.  - Discussed with RN.   - Soft diet per Surgery.  -Boost Breeze po TID with meals, each supplement provides 250 kcal and 9 grams of protein. - Ensure Surgery po TID, each supplement provides 330 kcal and 18 grams of protein.  - TPN to be discontinued today per Surgery.   NUTRITION DIAGNOSIS:   Severe Malnutrition related to chronic illness as evidenced by severe fat depletion, severe muscle depletion, percent weight loss (11% in 6 months). *ongoing  GOAL:   Patient will meet greater than or equal to 90% of their needs *not met  MONITOR:   PO intake, Supplement acceptance  REASON FOR ASSESSMENT:   NPO/Clear Liquid Diet (x5 days)    ASSESSMENT:   83 y.o. female with PMH significant for dementia, cognitive impairment, bipolar disorder, possible Parkinsons disease and h/o rection of GIST tumor who presented with complaints of abdominal pain nausea vomiting and a lot of abdominal distention. Admitted for small bowel obstruction.  9/17 Admit; NPO; NGT placed 9/23 s/p ex-lap with lysis of adhesions, repair of small bowel enterotomy  9/24 TPN initiated 9/26 TPN increased to goal 9/29 CT A/P showing persistent SBO; Surgery feels patient has post-op ileus 10/3 CLD 10/4 FLD 10/5 Calorie count ordered 10/8 Soft diet 10/9 TPN to be discontinued; calorie count initiated  Surgery planning to discontinue TPN today and start 72 hour calorie count to assess oral intake.  Patient sitting in bedside chair at time of visit. Lunch sitting in front of patient, untouched besides her milk. Patient  states she doesn't really want to eat it but unable to state as to why. Shares she really only wants to drink milk. She is documented to have had 100% of milk, apple juice, Ensure Surgery (ordered by Surgery this AM), and 50% of Boost Breeze.  Encouraged patient to try to eat 3 meals a day as tolerated. Stressed importance of trying to consume all supplements offered to support meeting nutritional needs. Reminded patient she can get milk with all meals and in between meals if desired and patient excited about this. Patient reports she hates ice cream so does not want to receive Magic Cup anymore.  Calorie count to start today. Will continue Boost Breeze and Ensure Surgery and discontinue Magic Cup per patient request.    Admission weight: 110# Current weight: 125# I&O's: +3.5L since 9/25   Medications reviewed: -   Labs reviewed: Na 134  Diet Order:   Diet Order             DIET SOFT Fluid consistency: Thin  Diet effective now                   EDUCATION NEEDS:  No education needs have been identified at this time  Skin:  Skin Assessment: Skin Integrity Issues: Skin Integrity Issues:: Incisions Incisions: Abdomen  Last BM:  10/9 - type 4  Height:  Ht Readings from Last 1 Encounters:  10/07/23 5' 2 (1.575 m)   Weight:  Wt Readings from Last 1 Encounters:  10/23/23 (P) 57.6 kg    BMI:  Body mass index  is 23.23 kg/m (pended).  Estimated Nutritional Needs:  Kcal:  1500-1700 kcals Protein:  75-90 grams Fluid:  >/= 1.5L    Trude Ned RD, LDN Contact via Secure Chat.

## 2023-10-24 ENCOUNTER — Ambulatory Visit: Attending: Cardiology | Admitting: Cardiology

## 2023-10-24 ENCOUNTER — Inpatient Hospital Stay (HOSPITAL_COMMUNITY)

## 2023-10-24 DIAGNOSIS — F0390 Unspecified dementia without behavioral disturbance: Secondary | ICD-10-CM | POA: Diagnosis not present

## 2023-10-24 DIAGNOSIS — K56609 Unspecified intestinal obstruction, unspecified as to partial versus complete obstruction: Secondary | ICD-10-CM | POA: Diagnosis not present

## 2023-10-24 DIAGNOSIS — D62 Acute posthemorrhagic anemia: Secondary | ICD-10-CM | POA: Diagnosis not present

## 2023-10-24 DIAGNOSIS — I4891 Unspecified atrial fibrillation: Secondary | ICD-10-CM | POA: Diagnosis not present

## 2023-10-24 LAB — CBC WITH DIFFERENTIAL/PLATELET
Abs Immature Granulocytes: 0.37 K/uL — ABNORMAL HIGH (ref 0.00–0.07)
Basophils Absolute: 0.1 K/uL (ref 0.0–0.1)
Basophils Relative: 1 %
Eosinophils Absolute: 0.1 K/uL (ref 0.0–0.5)
Eosinophils Relative: 1 %
HCT: 28 % — ABNORMAL LOW (ref 36.0–46.0)
Hemoglobin: 8.4 g/dL — ABNORMAL LOW (ref 12.0–15.0)
Immature Granulocytes: 4 %
Lymphocytes Relative: 20 %
Lymphs Abs: 1.7 K/uL (ref 0.7–4.0)
MCH: 30.9 pg (ref 26.0–34.0)
MCHC: 30 g/dL (ref 30.0–36.0)
MCV: 102.9 fL — ABNORMAL HIGH (ref 80.0–100.0)
Monocytes Absolute: 0.7 K/uL (ref 0.1–1.0)
Monocytes Relative: 9 %
Neutro Abs: 5.6 K/uL (ref 1.7–7.7)
Neutrophils Relative %: 65 %
Platelets: 404 K/uL — ABNORMAL HIGH (ref 150–400)
RBC: 2.72 MIL/uL — ABNORMAL LOW (ref 3.87–5.11)
RDW: 17.2 % — ABNORMAL HIGH (ref 11.5–15.5)
WBC: 8.6 K/uL (ref 4.0–10.5)
nRBC: 0.3 % — ABNORMAL HIGH (ref 0.0–0.2)

## 2023-10-24 LAB — GLUCOSE, CAPILLARY
Glucose-Capillary: 92 mg/dL (ref 70–99)
Glucose-Capillary: 93 mg/dL (ref 70–99)
Glucose-Capillary: 91 mg/dL (ref 70–99)

## 2023-10-24 LAB — HEPARIN LEVEL (UNFRACTIONATED): Heparin Unfractionated: 0.59 [IU]/mL (ref 0.30–0.70)

## 2023-10-24 MED ORDER — IOHEXOL 300 MG/ML  SOLN
100.0000 mL | Freq: Once | INTRAMUSCULAR | Status: AC | PRN
Start: 2023-10-24 — End: 2023-10-24
  Administered 2023-10-24: 100 mL via INTRAVENOUS

## 2023-10-24 MED ORDER — PANTOPRAZOLE SODIUM 40 MG IV SOLR
40.0000 mg | INTRAVENOUS | Status: DC
  Administered 2023-10-25 – 2023-10-27 (×3): 40 mg via INTRAVENOUS
  Filled 2023-10-24 (×3): qty 10

## 2023-10-24 MED ORDER — IOHEXOL 9 MG/ML PO SOLN
500.0000 mL | ORAL | Status: AC
  Administered 2023-10-24 (×2): 500 mL via ORAL

## 2023-10-24 MED ORDER — TRAVASOL 10 % IV SOLN
INTRAVENOUS | Status: DC
  Filled 2023-10-24: qty 889.2

## 2023-10-24 NOTE — Progress Notes (Signed)
 11 staples removed from abdominal incision at this time, Steri strip applied as ordered.  Pt tolerated well.

## 2023-10-24 NOTE — TOC Progression Note (Signed)
 Transition of Care [facility]) - Progression Note    Patient Details  Name: Tracey [name] MRN: [id number] Date of Birth: 1940-03-01  Transition of Care [facility] CM/SW Contact  Tawni CHRISTELLA Eva, LCSW Phone Number: 10/24/2023, 1:59 PM  Clinical Narrative:     Per chart review, the pt has resumed TPN. Will continue to monitor for medical stability. Care Management following.   Expected Discharge Plan: Skilled Nursing Facility Barriers to Discharge: Continued Medical Work up               Expected Discharge Plan and Services   Discharge Planning Services: CM Consult Post Acute Care Choice: Skilled Nursing Facility Living arrangements for the past 2 months: Assisted Living Facility                                       Social Drivers of Health (SDOH) Interventions SDOH Screenings   Food Insecurity: No Food Insecurity (10/02/2023)  Housing: Low Risk  (10/02/2023)  Transportation Needs: No Transportation Needs (10/02/2023)  Utilities: Not At Risk (10/02/2023)  Depression (PHQ2-9): Low Risk  (10/02/2023)  Social Connections: Moderately Integrated (10/02/2023)  Tobacco Use: Low Risk  (10/16/2023)    Readmission Risk Interventions     No data to display

## 2023-10-24 NOTE — Progress Notes (Signed)
 Calorie Count Note  72 hour calorie count ordered yesterday. Yesterday afternoon, patient noted to be vomiting profusely. Abdominal xray and NGT ordered for decompression. NGT placed but patient removed NGT shortly afterwards. NGT not replaced at this time. Patient now NPO. Surgery planning for repeat imaging today.  Discussed with Surgery and plan to restart TPN today given diet intolerance. Will discontinue calorie count as patient now NPO.  Intervention:  - Discontinue calorie count now that patient NPO.   - Plan to restart TPN.    - TPN management per pharmacy.   Will continue to monitor for plan of care.   Trude Ned RD, LDN Contact via [employer].

## 2023-10-24 NOTE — Progress Notes (Signed)
 Progress Note  17 Days Post-Op  Subjective: Son at bedside.  Patient denies abdominal pain. Reports no nausea or vomiting since yesterday evening/overnight. One bowel movement reported. Per son, no BM this morning. Having flatulence.   Patient currently NPO due to episodes of vomiting yesterday. No NGT present.  ROS  All negative with the exception of above.  Objective: Vital signs in last 24 hours: Temp:  [98 F (36.7 C)-98.9 F (37.2 C)] 98.9 F (37.2 C) (10/10 0359) Pulse Rate:  [70-72] 72 (10/10 0359) Resp:  [17-18] 18 (10/10 0359) BP: (128-142)/(68-71) 128/68 (10/10 0359) SpO2:  [100 %] 100 % (10/10 0359) Weight:  [54.2 kg] 54.2 kg (10/10 0359) Last BM Date : 10/23/23  Intake/Output from previous day: 10/09 0701 - 10/10 0700 In: 410 [P.O.:120; I.V.:90; IV Piggyback:200] Out: 3050 [Urine:2550; Emesis/NG output:500] Intake/Output this shift: Total I/O In: -  Out: 675 [Urine:675]  PE: General: Pleasant female who is laying in bed in NAD. HEENT: Head is normocephalic, atraumatic. Heart: HR normal. Lungs: Respiratory effort nonlabored. Abd: Soft, NT, ND. Incision present with steri strips. No rebound tenderness or guarding. Skin: Warm and dry.  Lab Results:  Recent Labs    10/23/23 0106 10/24/23 0310  WBC 9.1 8.6  HGB 8.2* 8.4*  HCT 27.8* 28.0*  PLT 426* 404*   BMET Recent Labs    10/22/23 0435 10/23/23 0106  NA 138 134*  K 4.2 4.2  CL 112* 108  CO2 18* 17*  GLUCOSE 111* 117*  BUN 38* 39*  CREATININE 0.64 0.59  CALCIUM 10.0 10.1   PT/INR No results for input(s): LABPROT, INR in the last 72 hours. CMP     Component Value Date/Time   NA 134 (L) 10/23/2023 0106   NA 139 01/26/2020 0000   NA 141 04/24/2012 0929   K 4.2 10/23/2023 0106   K 4.4 04/24/2012 0929   CL 108 10/23/2023 0106   CL 109 (H) 04/24/2012 0929   CO2 17 (L) 10/23/2023 0106   CO2 23 04/24/2012 0929   GLUCOSE 117 (H) 10/23/2023 0106   GLUCOSE 85 04/24/2012 0929    BUN 39 (H) 10/23/2023 0106   BUN 19 01/26/2020 0000   BUN 20.6 04/24/2012 0929   CREATININE 0.59 10/23/2023 0106   CREATININE 0.79 04/18/2023 1524   CREATININE 0.9 04/24/2012 0929   CALCIUM 10.1 10/23/2023 0106   CALCIUM 10.5 (H) 01/12/2020 2123   CALCIUM 10.0 04/24/2012 0929   PROT 5.8 (L) 10/23/2023 0106   PROT 6.7 04/24/2012 0929   ALBUMIN  3.2 (L) 10/23/2023 0106   ALBUMIN  3.5 04/24/2012 0929   AST 17 10/23/2023 0106   AST 20 04/24/2012 0929   ALT 11 10/23/2023 0106   ALT 12 04/24/2012 0929   ALKPHOS 93 10/23/2023 0106   ALKPHOS 75 04/24/2012 0929   BILITOT 0.2 10/23/2023 0106   BILITOT 0.36 04/24/2012 0929   GFRNONAA >60 10/23/2023 0106   GFRNONAA 58 (L) 05/11/2020 0700   GFRAA 68 05/11/2020 0700   Lipase     Component Value Date/Time   LIPASE 22 09/18/2013 1810       Studies/Results: DG Abd Portable 1V Result Date: 10/23/2023 CLINICAL DATA:  Nausea and vomiting EXAM: PORTABLE ABDOMEN - 1 VIEW COMPARISON:  CT abdomen and pelvis 10/16/2023 FINDINGS: Scattered gas and stool in the colon. Residual contrast material in the right lower quadrant, likely in the cecum. No small or large bowel distention. No radiopaque stones. Degenerative changes in the lumbar spine and hips. Lumbar scoliosis  convex towards the right. Surgical clips in the right pelvis. Skin staples along the midline consistent with recent surgery. No radiopaque stones. IMPRESSION: Nonobstructive bowel gas pattern.  Postoperative changes. Electronically Signed   By: Elsie Gravely M.D.   On: 10/23/2023 20:13    Anti-infectives: Anti-infectives (From admission, onward)    Start     Dose/Rate Route Frequency Ordered Stop   10/14/23 0930  ceFEPIme (MAXIPIME) 2 g in sodium chloride  0.9 % 100 mL IVPB        2 g 200 mL/hr over 30 Minutes Intravenous Every 12 hours 10/14/23 0820 10/23/23 2306   10/14/23 0900  metroNIDAZOLE (FLAGYL) IVPB 500 mg        500 mg 100 mL/hr over 60 Minutes Intravenous 2 times daily  10/14/23 0811 10/23/23 2359   10/07/23 0915  cefoTEtan  (CEFOTAN ) 2 g in sodium chloride  0.9 % 100 mL IVPB        2 g 200 mL/hr over 30 Minutes Intravenous On call to O.R. 10/07/23 0820 10/07/23 1938        Assessment/Plan SBO POD 17 s/p dx lap converted to ex lap with LOA and repair of enterotomy, Dr. Dasie 9/23. - CT AP 9/29 reviewed independently and with IR - pelvic collection does appear to be developing and seems to communicate with other small collections but is too early to drain - CT repeated 10/2 showed intra-abdominal collection is smaller, but it does appear more mature/thick-walled. Clinically the patient has a resolving ileus and her leukocytosis has resolved. Discussed with IR, will hold off on drainage since collection is smaller and patient improving, if there is a clinical change they can re-consider drainage. - Afebrile. - WBC 8.6, Hgb 8.4 stable - TPN discontinued 10/9. - Patient had episode of emesis yesterday 10/9. Xray showed post operative changes and nonobstructive bowel gas pattern. Currently NPO. Having bowel movements with flatulence.  - Planning to repeat CT today. - Will continue to follow.     FEN: NPO VTE: Heparin  infusion  ID: Cefepime/Metronidazole completed 10/9.    - Per TRH - Dementia  GERD Tremors  HTN  Anxiety  A fib RVR - on amio gtt, heparin  gtt  Anemia    LOS: 23 days   I reviewed specialist notes, nursing notes, last 24 h vitals and pain scores, last 48 h intake and output, last 24 h labs and trends, and last 24 h imaging results.  This care required moderate level of medical decision making.    Marjorie Carlyon Favre, [facility] Surgery 10/24/2023, 9:18 AM Please see Amion for pager number during day hours 7:00am-4:30pm

## 2023-10-24 NOTE — Progress Notes (Signed)
 PHARMACY - ANTICOAGULATION CONSULT NOTE  Pharmacy Consult for heparin   Indication: atrial fibrillation  Allergies  Allergen Reactions   Lisinopril Cough   Penicillins Itching and Other (See Comments)    50 years ago   Adhesive [Tape] Itching   Atorvastatin Itching   Dilaudid  [Hydromorphone  Hcl] Itching    Patient Measurements: Height: 5' 2 (157.5 cm) Weight: 54.2 kg (119 lb 7.8 oz) IBW/kg (Calculated) : 50.1 HEPARIN  DW (KG): 50.2  Vital Signs: Temp: 98.9 F (37.2 C) (10/10 0359) Temp Source: Oral (10/10 0359) BP: 128/68 (10/10 0359) Pulse Rate: 72 (10/10 0359)  Labs: Recent Labs    10/22/23 0435 10/23/23 0106 10/24/23 0310  HGB 7.7* 8.2* 8.4*  HCT 25.3* 27.8* 28.0*  PLT 426* 426* 404*  HEPARINUNFRC 0.44 0.51 0.59  CREATININE 0.64 0.59  --     Estimated Creatinine Clearance: 42.1 mL/min (by C-G formula based on SCr of 0.59 mg/dL).   Medical History: 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, possible    Tremors possible parkinsons   Manic disorder, single episode, in full remission 12/01/2009   Sixth nerve palsy      Assessment: Patient is an 83 y.o F who presented the ED on 10/01/23 with c/o constipation, abdominal pain and n/v.  She was subsequently found to have high-grade SBO and underwent exploratory laparotomy with lysis of adhesions and repair of small bowel enterotomy on 10/07/23.  She is currently on  heparin  drip for afib.  Today, 10/24/2023: - heparin  level is therapeutic at 0.59 - hgb low but stable, plts 404K - no bleeding documented  Goal of Therapy:  Heparin  level 0.3-0.7 units/ml Monitor platelets by anticoagulation protocol: Yes   Plan:  - continue heparin  drip at 900 units/hr - daily heparin  level and cbc - monitor for s/sx bleeding   Paitynn Mikus P 10/24/2023,6:52 AM

## 2023-10-24 NOTE — Progress Notes (Signed)
  Progress Note   Patient: Tracey [name] FMW:[id number] DOB: 1940-04-11 DOA: 10/01/2023     23 DOS: the patient was seen and examined on 10/24/2023   Brief hospital course:   Assessment and Plan: Small bowel obstruction Postoperative ileus  Failed conservative management, status post diagnostic and laparoscopic, lysis of adhesions, repair of small bowel enterotomy 9/23. Course complicated by prolonged ileus, possible colitis, intra-abdominal abscesses noted on CT 9/29 Continue antibiotics as per general surgery Continue management per general surgery TPN restarted per surgery.     Postoperative blood loss anemia Received IV iron  during hospitalization Hemoglobin stable.   Paroxysmal atrial fibrillation with rapid ventricular response In the setting of complicated postoperative course Continue amiodarone , heparin    Dementia without behavioral disturbance Stable   Acute urinary retention resolved   Severe malnutrition       Subjective:  Wants to go home  Physical Exam: Vitals:   10/23/23 1314 10/23/23 1954 10/24/23 0359 10/24/23 1449  BP: 130/71 (!) 142/68 128/68 (!) 102/44  Pulse: 72 70 72 (!) 59  Resp: 18 17 18 18   Temp: 98.2 F (36.8 C) 98 F (36.7 C) 98.9 F (37.2 C) 98.4 F (36.9 C)  TempSrc: Oral Oral Oral Oral  SpO2: 100% 100% 100% 100%  Weight:   54.2 kg   Height:       Physical Exam Vitals reviewed.  Constitutional:      General: She is not in acute distress.    Appearance: She is not ill-appearing or toxic-appearing.  Cardiovascular:     Rate and Rhythm: Normal rate and regular rhythm.     Heart sounds: No murmur heard. Pulmonary:     Effort: Pulmonary effort is normal. No respiratory distress.     Breath sounds: No wheezing, rhonchi or rales.  Neurological:     Mental Status: She is alert.  Psychiatric:        Mood and Affect: Mood normal.        Behavior: Behavior normal.     Data Reviewed: CBG stable Hemoglobin 8.4  Family  Communication: none  Disposition: Status is: Inpatient Remains inpatient appropriate because: per surger     Time spent: 20 minutes  Author: Toribio Door, MD 10/24/2023 6:09 PM  For on call review [email or web address].

## 2023-10-24 NOTE — Progress Notes (Signed)
 PHARMACY - TOTAL PARENTERAL NUTRITION CONSULT NOTE   Indication: Small bowel obstruction and likely post-op ileus  Patient Measurements: Height: 5' 2 (157.5 cm) Weight: 54.2 kg (119 lb 7.8 oz) IBW/kg (Calculated) : 50.1 TPN AdjBW (KG): 50.2 Body mass index is 21.85 kg/m.  Assessment:  83 YO female presenting 9/17 with abdominal pain and distension. Initial CT A/P showed high-grade small bowel obstruction with transition point in the pelvis to the right of the midline and mild mesenteric edema; repeat CT continued to show ongoing dilated small bowel loops. Patient taken to OR 9/23 for diagnostic laparoscopy with LOA and repair of prior small bowel enterotomy. NGT in place. Pharmacy consulted for TPN management in the setting of SBO.   Glucose / Insulin : No Hx DM - CBGs well controlled (goal 100-150) on goal rate TPN. CBG/s and SSI stopped 10/2 Electrolytes: labs on 10/9 --> Na low 134, Bicarb low at 17 (acetate maxed in TPN, also on Na bicarb 650 mg PO TID) , CorrCa elevated at 10.74 (none in TPN); other lytes wnl Renal: labs on 10/9: BUN 39 elevated; SCr stable WNL <1 Hepatic:  LFTs and Tbili WNL - albumin  low 3.2 - TG 199 elevated (9/29 labs), 65 (10/6) I/O: - 2640 mL - UOP: 2550 mL - mIVF: none currently - LBM: 10/10 GI Imaging: - 9/17 a/p: High-grade small bowel obstruction with transition point in the pelvis to the right of midline - 9/19 DG abd: SBO pattern with no improvement since the prior CT - 9/21 CT chest/a/p: Small bowel loops remain dilated in the abdomen and pelvis. There is some diluted contrast in the dilated small bowel loops, but no discernible contrast in the colonic lumen. The degree of small-bowel dilatation appears stable to mildly progressive in the interval - 9/23 AXR: persistent SBO with mild improvement - 9/24 AXR: some decompression of SB in upper abdomen noted - 9/29 CT a/p: persistent SBO with transition point in RLQ, signs of colitis and basilar  atelectasis.  - 10/2 abd CT: nterval resolution of previously seen bowel obstruction.  wall collection - postoperative seroma or developing abscess?  GI Surgeries / Procedures:  - 9/23: Exploratory laparotomy with lysis of adhesions, primary repair of small bowel enterotomy  Central access: PICC obtained 9/23 TPN start date: 9/24  Nutritional Goals: Goal TPN rate is 65 mL/hr (provides 89 g of protein and 1619 kcals per day)  - 10/3: *Noted TPN tubing broken. TPN off with D10W since early this AM infusing - 10/4: on full liquid diet  - 10/9:  TPN d/ced - 10/10: resuming TPN back since patient is having emesis. Now NPO   RD Assessment: Estimated Needs Total Energy Estimated Needs: 1500-1700 kcals Total Protein Estimated Needs: 75-90 grams Total Fluid Estimated Needs: >/= 1.5L  Current Nutrition:  NPO and TPN  Plan:   Today, at 1800: Resume TPN at goal rate of 65 mL/hr Electrolytes in TPN:  Na to 50 mEq/L K 40 mEq/L Ca 0 mEq/L Mg 5 mEq/L Phos 20 mmol/L Cl:Ac max Ac  Add standard MVI and trace elements to TPN 10/2: D/c SSI and CBGs Further mIVF per MD (none currently) Bmet, phos and  mag  on 10/11 Monitor TPN labs on Mon/Thurs, and PRN   Iantha Batch, PharmD, BCPS 10/24/2023 11:22 AM

## 2023-10-24 NOTE — Progress Notes (Signed)
 PT Cancellation Note  Patient Details Name: MIKAELYN ARTHURS MRN: [id number] DOB: January 16, 1940   Cancelled Treatment:     Attempted to see twice today AM...SABRASABRASABRArough night Nausea now sleeping PM..... sleeping, did not disturb  Pt has been evaluated with rec for SNF.  Rehab Team to continue to follow and attempt to see another day.  Katheryn Leap  PTA Acute  Rehabilitation Services Office M-F          [phone]

## 2023-10-25 DIAGNOSIS — K56609 Unspecified intestinal obstruction, unspecified as to partial versus complete obstruction: Secondary | ICD-10-CM | POA: Diagnosis not present

## 2023-10-25 DIAGNOSIS — F0390 Unspecified dementia without behavioral disturbance: Secondary | ICD-10-CM | POA: Diagnosis not present

## 2023-10-25 DIAGNOSIS — I480 Paroxysmal atrial fibrillation: Secondary | ICD-10-CM | POA: Diagnosis not present

## 2023-10-25 LAB — CBC WITH DIFFERENTIAL/PLATELET
Abs Immature Granulocytes: 0.16 K/uL — ABNORMAL HIGH (ref 0.00–0.07)
Basophils Absolute: 0.1 K/uL (ref 0.0–0.1)
Basophils Relative: 1 %
Eosinophils Absolute: 0.2 K/uL (ref 0.0–0.5)
Eosinophils Relative: 3 %
HCT: 27.4 % — ABNORMAL LOW (ref 36.0–46.0)
Hemoglobin: 8.5 g/dL — ABNORMAL LOW (ref 12.0–15.0)
Immature Granulocytes: 3 %
Lymphocytes Relative: 25 %
Lymphs Abs: 1.6 K/uL (ref 0.7–4.0)
MCH: 32.1 pg (ref 26.0–34.0)
MCHC: 31 g/dL (ref 30.0–36.0)
MCV: 103.4 fL — ABNORMAL HIGH (ref 80.0–100.0)
Monocytes Absolute: 0.7 K/uL (ref 0.1–1.0)
Monocytes Relative: 10 %
Neutro Abs: 3.9 K/uL (ref 1.7–7.7)
Neutrophils Relative %: 58 %
Platelets: 367 K/uL (ref 150–400)
RBC: 2.65 MIL/uL — ABNORMAL LOW (ref 3.87–5.11)
RDW: 17.8 % — ABNORMAL HIGH (ref 11.5–15.5)
WBC: 6.5 K/uL (ref 4.0–10.5)
nRBC: 0.3 % — ABNORMAL HIGH (ref 0.0–0.2)

## 2023-10-25 LAB — GLUCOSE, CAPILLARY
Glucose-Capillary: 106 mg/dL — ABNORMAL HIGH (ref 70–99)
Glucose-Capillary: 122 mg/dL — ABNORMAL HIGH (ref 70–99)
Glucose-Capillary: 122 mg/dL — ABNORMAL HIGH (ref 70–99)
Glucose-Capillary: 130 mg/dL — ABNORMAL HIGH (ref 70–99)

## 2023-10-25 LAB — BASIC METABOLIC PANEL WITH GFR
Anion gap: 7 (ref 5–15)
BUN: 29 mg/dL — ABNORMAL HIGH (ref 8–23)
CO2: 25 mmol/L (ref 22–32)
Calcium: 10.4 mg/dL — ABNORMAL HIGH (ref 8.9–10.3)
Chloride: 109 mmol/L (ref 98–111)
Creatinine, Ser: 0.62 mg/dL (ref 0.44–1.00)
GFR, Estimated: >60 mL/min (ref >60)
Glucose, Bld: 120 mg/dL — ABNORMAL HIGH (ref 70–99)
Potassium: 4.2 mmol/L (ref 3.5–5.1)
Sodium: 141 mmol/L (ref 135–145)

## 2023-10-25 LAB — HEPARIN LEVEL (UNFRACTIONATED): Heparin Unfractionated: 0.79 [IU]/mL — ABNORMAL HIGH (ref 0.30–0.70)

## 2023-10-25 LAB — MAGNESIUM: Magnesium: 2.5 mg/dL — ABNORMAL HIGH (ref 1.7–2.4)

## 2023-10-25 LAB — PHOSPHORUS: Phosphorus: 3.2 mg/dL (ref 2.5–4.6)

## 2023-10-25 MED ORDER — TRAVASOL 10 % IV SOLN
INTRAVENOUS | Status: DC
  Filled 2023-10-25 (×2): qty 889.2

## 2023-10-25 MED ORDER — APIXABAN 2.5 MG PO TABS
2.5000 mg | ORAL_TABLET | Freq: Two times a day (BID) | ORAL | Status: DC
  Administered 2023-10-25 – 2023-10-27 (×6): 2.5 mg via ORAL
  Filled 2023-10-25 (×6): qty 1

## 2023-10-25 NOTE — Progress Notes (Signed)
 18 Days Post-Op   Subjective/Chief Complaint: Continues to have bowel movements No further nausea or vomiting CT scan shows no sign of obstruction, contrast to rectum; smaller pelvic fluid collection   Objective: Vital signs in last 24 hours: Temp:  [98 F (36.7 C)-99.1 F (37.3 C)] 98 F (36.7 C) (10/11 0453) Pulse Rate:  [54-59] 54 (10/11 0453) Resp:  [14-20] 17 (10/11 0453) BP: (102-139)/(44-63) 139/63 (10/11 0453) SpO2:  [100 %] 100 % (10/11 0453) Weight:  [54.3 kg] 54.3 kg (10/11 0500) Last BM Date : 10/24/23  Intake/Output from previous day: 10/10 0701 - 10/11 0700 In: 102.7 [I.V.:102.7] Out: 2575 [Urine:2575] Intake/Output this shift: No intake/output data recorded.  General: Pleasant female who is laying in bed in NAD. HEENT: Head is normocephalic, atraumatic. Heart: HR normal. Lungs: Respiratory effort nonlabored. Abd: Soft, NT, ND. Incision c/d/i with steri strips. No rebound tenderness or guarding. Skin: Warm and dry.  Lab Results:  Recent Labs    10/24/23 0310 10/25/23 0312  WBC 8.6 6.5  HGB 8.4* 8.5*  HCT 28.0* 27.4*  PLT 404* 367   BMET Recent Labs    10/23/23 0106 10/25/23 0312  NA 134* 141  K 4.2 4.2  CL 108 109  CO2 17* 25  GLUCOSE 117* 120*  BUN 39* 29*  CREATININE 0.59 0.62  CALCIUM 10.1 10.4*    Studies/Results: CT ABDOMEN PELVIS W CONTRAST Result Date: 10/24/2023 CLINICAL DATA:  Small bowel obstruction and postoperative ileus EXAM: CT ABDOMEN AND PELVIS WITH CONTRAST TECHNIQUE: Multidetector CT imaging of the abdomen and pelvis was performed using the standard protocol following bolus administration of intravenous contrast. RADIATION DOSE REDUCTION: This exam was performed according to the departmental dose-optimization program which includes automated exposure control, adjustment of the mA and/or kV according to patient size and/or use of iterative reconstruction technique. CONTRAST:  OMNIPAQUE  IOHEXOL  300 MG/ML  SOLN  COMPARISON:  CT abdomen and pelvis dated 10/16/2023 FINDINGS: Lower chest: No focal consolidation or pulmonary nodule in the lung bases. Decreased trace right pleural effusion. Resolution of left pleural effusion. Partially imaged heart size is normal. Hepatobiliary: Unchanged scattered subcentimeter hypodensities, too small to characterize. No intra or extrahepatic biliary ductal dilation. Normal gallbladder. Pancreas: No focal lesions or main ductal dilation. 8 mm hypodensity near the pancreatic head (2:25). No main pancreatic ductal dilation. Spleen: Normal in size without focal abnormality. Adrenals/Urinary Tract: No adrenal nodules. No suspicious renal mass, calculi or hydronephrosis. Left renal simple and minimally complicated cysts. Additional bilateral subcentimeter hypodensities, too small to characterize. Mildly distended urinary bladder contains intraluminal gas. Stomach/Bowel: Small hiatal hernia. Normal appearance of the stomach. No evidence of bowel wall thickening, distention, or inflammatory changes. Enteric contrast material is seen within distal small bowel loops and reaches the level of the rectum. Appendix is not discretely seen. Vascular/Lymphatic: Aortic atherosclerosis. No enlarged abdominal or pelvic lymph nodes. Reproductive: No adnexal masses. Other: Pelvic fluid collection measures 2.7 x 0.7 cm (2:64), previously 6.4 x 1.9 cm. Previously noted irregular collections in the left hemiabdomen are no longer seen. No free air. Musculoskeletal: No acute or abnormal lytic or blastic osseous lesions. Multilevel degenerative changes of the partially imaged thoracic and lumbar spine. Postsurgical changes of the anterior abdominal wall. Increased size of lobulated densities along the surgical incision measuring up to 2.6 x 2.1 cm (2:55) at the inferior aspect. IMPRESSION: 1. No evidence of bowel obstruction. Enteric contrast material is seen within distal small bowel loops and reaches the level of the  rectum.  2. Decreased size of pelvic fluid collection measuring 2.7 x 0.7 cm, previously 6.4 x 1.9 cm. Previously noted irregular collections in the left hemiabdomen are no longer seen. 3. Increased size of lobulated densities along the surgical incision measuring up to 2.6 x 2.1 cm at the inferior aspect, which may represent hematomas or seromas. 4. Decreased trace right pleural effusion. Resolution of left pleural effusion. 5. Mildly distended urinary bladder contains intraluminal gas, which may be related to recent instrumentation. 6.  Aortic Atherosclerosis (ICD10-I70.0). Electronically Signed   By: Limin  Xu M.D.   On: 10/24/2023 18:59   DG Abd Portable 1V Result Date: 10/23/2023 CLINICAL DATA:  Nausea and vomiting EXAM: PORTABLE ABDOMEN - 1 VIEW COMPARISON:  CT abdomen and pelvis 10/16/2023 FINDINGS: Scattered gas and stool in the colon. Residual contrast material in the right lower quadrant, likely in the cecum. No small or large bowel distention. No radiopaque stones. Degenerative changes in the lumbar spine and hips. Lumbar scoliosis convex towards the right. Surgical clips in the right pelvis. Skin staples along the midline consistent with recent surgery. No radiopaque stones. IMPRESSION: Nonobstructive bowel gas pattern.  Postoperative changes. Electronically Signed   By: Elsie Gravely M.D.   On: 10/23/2023 20:13    Anti-infectives: Anti-infectives (From admission, onward)    Start     Dose/Rate Route Frequency Ordered Stop   10/14/23 0930  ceFEPIme (MAXIPIME) 2 g in sodium chloride  0.9 % 100 mL IVPB        2 g 200 mL/hr over 30 Minutes Intravenous Every 12 hours 10/14/23 0820 10/24/23 1500   10/14/23 0900  metroNIDAZOLE (FLAGYL) IVPB 500 mg        500 mg 100 mL/hr over 60 Minutes Intravenous 2 times daily 10/14/23 0811 10/24/23 1610   10/07/23 0915  cefoTEtan  (CEFOTAN ) 2 g in sodium chloride  0.9 % 100 mL IVPB        2 g 200 mL/hr over 30 Minutes Intravenous On call to O.R. 10/07/23  0820 10/07/23 1938       Assessment/Plan: SBO POD 18 s/p dx lap converted to ex lap with LOA and repair of enterotomy, Dr. Dasie 9/23. - CT AP 9/29 reviewed independently and with IR - pelvic collection does appear to be developing and seems to communicate with other small collections but is too early to drain - CT repeated 10/2 showed intra-abdominal collection is smaller, but it does appear more mature/thick-walled. Clinically the patient has a resolving ileus and her leukocytosis has resolved. Discussed with IR, will hold off on drainage since collection is smaller and patient improving, if there is a clinical change they can re-consider drainage. - Afebrile. - WBC 8.6, Hgb 8.4 stable - TPN discontinued 10/9. - Patient had episode of emesis 10/9. Xray showed post operative changes and nonobstructive bowel gas pattern. Currently NPO. Having bowel movements with flatulence.  - CT 10/10 showed no sign of obstruction and resolving pelvic fluid collection. - Advance diet as tolerated - proceed with discharge planning per primary team     FEN: Full liquids ADAT VTE: Heparin  infusion  ID: Cefepime/Metronidazole completed 10/9.     - Per TR[facility] - Dementia  GERD Tremors  HTN  Anxiety  A fib RVR - on amio gtt, heparin  gtt  Anemia      LOS: 24 days    Tracey [name] 10/25/2023

## 2023-10-25 NOTE — Progress Notes (Addendum)
 PHARMACY - TOTAL PARENTERAL NUTRITION CONSULT NOTE   Indication: Small bowel obstruction and likely post-op ileus  Patient Measurements: Height: 5' 2 (157.5 cm) Weight: 54.2 kg (119 lb 7.8 oz) IBW/kg (Calculated) : 50.1 TPN AdjBW (KG): 50.2 Body mass index is 21.85 kg/m.  Assessment:  83 YO female presenting 9/17 with abdominal pain and distension. Initial CT A/P showed high-grade small bowel obstruction with transition point in the pelvis to the right of the midline and mild mesenteric edema; repeat CT continued to show ongoing dilated small bowel loops. Patient taken to OR 9/23 for diagnostic laparoscopy with LOA and repair of prior small bowel enterotomy. NGT in place. Pharmacy consulted for TPN management in the setting of SBO.   Glucose / Insulin : No Hx DM - CBGs well controlled (goal 100-150) on goal rate TPN. CBG/s and SSI stopped 10/2 Electrolytes: Bicarb now wnl (acetate maxed in TPN, also on Na bicarb 650 mg PO TID) , CorrCa elevated at 11.04 (none in TPN), Mag slightly elevated at 2.5; other lytes wnl Renal: BUN elevated at 29 (trending down); SCr stable WNL <1 Hepatic:  LFTs and Tbili WNL - albumin  low 3.2 - TG 199 elevated (9/29 labs), 65 (10/6) I/O: - 2472 mL - UOP: 2575 mL - mIVF: none currently - LBM: 10/10 GI Imaging: - 9/17 a/p: High-grade small bowel obstruction with transition point in the pelvis to the right of midline - 9/19 DG abd: SBO pattern with no improvement since the prior CT - 9/21 CT chest/a/p: Small bowel loops remain dilated in the abdomen and pelvis. There is some diluted contrast in the dilated small bowel loops, but no discernible contrast in the colonic lumen. The degree of small-bowel dilatation appears stable to mildly progressive in the interval - 9/23 AXR: persistent SBO with mild improvement - 9/24 AXR: some decompression of SB in upper abdomen noted - 9/29 CT a/p: persistent SBO with transition point in RLQ, signs of colitis and basilar  atelectasis.  - 10/2 abd CT: nterval resolution of previously seen bowel obstruction.  wall collection - postoperative seroma or developing abscess? - 10/10 abd CT: No evidence of bowel obstruction. Decreased size of pelvic fluid collection. Increased size of lobulated densities along the surgical incision which may represent hematomas or seromas.  GI Surgeries / Procedures:  - 9/23: Exploratory laparotomy with lysis of adhesions, primary repair of small bowel enterotomy  Central access: PICC obtained 9/23 TPN start date: 9/24  Nutritional Goals: Goal TPN rate is 65 mL/hr (provides 89 g of protein and 1619 kcals per day)  - 10/3: *Noted TPN tubing broken. TPN off with D10W since early this AM infusing - 10/4: on full liquid diet  - 10/9:  TPN d/ced - 10/10: resuming TPN back since patient is having emesis. Now NPO   RD Assessment: Estimated Needs Total Energy Estimated Needs: 1500-1700 kcals Total Protein Estimated Needs: 75-90 grams Total Fluid Estimated Needs: >/= 1.5L  Current Nutrition:  NPO and TPN  Plan:   Today, at 1800: Continue TPN at goal rate of 65 mL/hr Electrolytes in TPN:  Reduce Na to 40 mEq/L K 40 mEq/L Ca 0 mEq/L Reduce Mg to 2 mEq/L Phos 20 mmol/L Cl:Ac --> max Ac  Add standard MVI and trace elements to TPN 10/2: D/c SSI and CBGs Further mIVF per MD (none currently) Bmet, phos and  mag  on 10/12 Monitor TPN labs on Mon/Thurs, and PRN   Iantha Batch, PharmD, BCPS 10/25/2023 6:10 AM  __________________________________  Adden: per  Dr. Jadine and Dr. Belinda via [place] msg, stop TPN today - will reduce rate to 30 ml/hr, run at this rate for two hours and then d/c the TPN.  - Pharmcy will sign off  Iantha Batch, PharmD, BCPS 10/25/2023 10:41 AM

## 2023-10-25 NOTE — Progress Notes (Signed)
 Mobility Specialist - Progress Note   10/25/23 0850  Mobility  Activity Ambulated with assistance  Level of Assistance Minimal assist, patient does 75% or more  Assistive Device Front wheel walker  Distance Ambulated (ft) 200 ft  Range of Motion/Exercises Active  Activity Response Tolerated well  Mobility Referral Yes  Mobility visit 1 Mobility  Mobility Specialist Start Time (ACUTE ONLY) 0830  Mobility Specialist Stop Time (ACUTE ONLY) 0850  Mobility Specialist Time Calculation (min) (ACUTE ONLY) 20 min   Pt was found in bed and agreeable to mobilize. Repeated I want to go home throughout session. No other complaints. AT EOS returned to recliner chair with all needs met. Call bell in reach and RN notified.   Erminio Leos,  Mobility Specialist Can be reached via Secure Chat

## 2023-10-25 NOTE — Plan of Care (Signed)

## 2023-10-25 NOTE — Progress Notes (Signed)
 PHARMACY - ANTICOAGULATION CONSULT NOTE  Pharmacy Consult for heparin   Indication: atrial fibrillation  Allergies  Allergen Reactions   Lisinopril Cough   Penicillins Itching and Other (See Comments)    50 years ago   Adhesive [Tape] Itching   Atorvastatin Itching   Dilaudid  [Hydromorphone  Hcl] Itching    Patient Measurements: Height: 5' 2 (157.5 cm) Weight: 54.2 kg (119 lb 7.8 oz) IBW/kg (Calculated) : 50.1 HEPARIN  DW (KG): 50.2  Vital Signs: Temp: 99.1 F (37.3 C) (10/10 1948) Temp Source: Oral (10/10 1948) BP: 117/61 (10/10 1948) Pulse Rate: 54 (10/10 1948)  Labs: Recent Labs    10/22/23 0435 10/23/23 0106 10/24/23 0310 10/25/23 0312  HGB 7.7* 8.2* 8.4* 8.5*  HCT 25.3* 27.8* 28.0* 27.4*  PLT 426* 426* 404* 367  HEPARINUNFRC 0.44 0.51 0.59 0.79*  CREATININE 0.64 0.59  --   --     Estimated Creatinine Clearance: 42.1 mL/min (by C-G formula based on SCr of 0.59 mg/dL).   Medical History: 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, possible    Tremors possible parkinsons   Manic disorder, single episode, in full remission 12/01/2009   Sixth nerve palsy      Assessment: Patient is an 83 y.o F who presented the ED on 10/01/23 with c/o constipation, abdominal pain and n/v.  She was subsequently found to have high-grade SBO and underwent exploratory laparotomy with lysis of adhesions and repair of small bowel  enterotomy on 10/07/23.  She is currently on heparin  drip for afib.  Today, 10/25/2023: - heparin  level 0.79- now elevated on IV heparin  900 units/hr >verified with IV RN with labs were drawn from PICC line and IV heparin  infusing peripherally - hgb low but stable, plts WNL - no bleeding or infusion related concerns reported by RN  Goal of Therapy:  Heparin  level 0.3-0.7 units/ml Monitor platelets by anticoagulation protocol: Yes   Plan:  - decrease heparin  drip to 800 units/hr - recheck heparin  level 8h after rate decrease - daily heparin  level and cbc - monitor for s/sx bleeding   Rosaline Millet, PharmD 10/25/2023,3:44 AM

## 2023-10-25 NOTE — Progress Notes (Signed)
  Progress Note   Patient: Tracey [name] FMW:[id number] DOB: [birth year] DOA: 10/01/2023     24 DOS: the patient was seen and examined on 10/25/2023   Brief hospital course:   Assessment and Plan: Small bowel obstruction Postoperative ileus  Failed conservative management, status post diagnostic and laparoscopic, lysis of adhesions, repair of small bowel enterotomy 9/23. Course complicated by prolonged ileus, possible colitis, intra-abdominal abscesses noted on CT 9/29 Completed antibiotics, per general surgery patient is now cleared for discharge, stop TNA   Postoperative blood loss anemia Received IV iron  during hospitalization Hemoglobin stable.   Paroxysmal atrial fibrillation with rapid ventricular response In the setting of complicated postoperative course Continue amiodarone , change to apixaban   Dementia without behavioral disturbance Stable   Acute urinary retention resolved   Severe malnutrition      Subjective:  I am going home today  Physical Exam: Vitals:   10/24/23 2345 10/25/23 0453 10/25/23 0500 10/25/23 1357  BP:  139/63  123/66  Pulse:  (!) 54  65  Resp: 16 17  16   Temp:  98 F (36.7 C)  98.1 F (36.7 C)  TempSrc:  Oral  Oral  SpO2:  100%  100%  Weight:   54.3 kg   Height:       Physical Exam Vitals reviewed.  Constitutional:      General: She is not in acute distress.    Appearance: She is not ill-appearing or toxic-appearing.  Cardiovascular:     Rate and Rhythm: Normal rate and regular rhythm.     Heart sounds: No murmur heard. Pulmonary:     Effort: Pulmonary effort is normal. No respiratory distress.     Breath sounds: No wheezing, rhonchi or rales.  Neurological:     Mental Status: She is alert.  Psychiatric:        Mood and Affect: Mood normal.        Behavior: Behavior normal.     Data Reviewed: CBG stable BMP notable for mild hypercalcemia in the context of TNA Hemoglobin stable at 8.5  Family Communication:  none  Disposition: Status is: Inpatient Remains inpatient appropriate because: plan SNF     Time spent: 20 minutes  Author: Toribio Door, MD 10/25/2023 4:25 PM  For on call review [email or web address].

## 2023-10-25 NOTE — Plan of Care (Signed)
  Problem: Nutrition: Goal: Adequate nutrition will be maintained Outcome: Progressing   Problem: Coping: Goal: Level of anxiety will decrease Outcome: Progressing   Problem: Elimination: Goal: Will not experience complications related to bowel motility Outcome: Progressing   Problem: Coping: Goal: Ability to adjust to condition or change in health will improve Outcome: Not Progressing

## 2023-10-25 NOTE — Discharge Instructions (Signed)

## 2023-10-26 DIAGNOSIS — R911 Solitary pulmonary nodule: Secondary | ICD-10-CM | POA: Insufficient documentation

## 2023-10-26 DIAGNOSIS — F319 Bipolar disorder, unspecified: Secondary | ICD-10-CM | POA: Insufficient documentation

## 2023-10-26 DIAGNOSIS — F0390 Unspecified dementia without behavioral disturbance: Secondary | ICD-10-CM | POA: Diagnosis not present

## 2023-10-26 DIAGNOSIS — J479 Bronchiectasis, uncomplicated: Secondary | ICD-10-CM

## 2023-10-26 DIAGNOSIS — I2489 Other forms of acute ischemic heart disease: Secondary | ICD-10-CM | POA: Insufficient documentation

## 2023-10-26 DIAGNOSIS — N739 Female pelvic inflammatory disease, unspecified: Secondary | ICD-10-CM | POA: Insufficient documentation

## 2023-10-26 DIAGNOSIS — I480 Paroxysmal atrial fibrillation: Secondary | ICD-10-CM | POA: Diagnosis not present

## 2023-10-26 DIAGNOSIS — K567 Ileus, unspecified: Secondary | ICD-10-CM | POA: Insufficient documentation

## 2023-10-26 DIAGNOSIS — K56609 Unspecified intestinal obstruction, unspecified as to partial versus complete obstruction: Secondary | ICD-10-CM | POA: Diagnosis not present

## 2023-10-26 LAB — CBC WITH DIFFERENTIAL/PLATELET
Abs Immature Granulocytes: 0.08 K/uL — ABNORMAL HIGH (ref 0.00–0.07)
Basophils Absolute: 0 K/uL (ref 0.0–0.1)
Basophils Relative: 1 %
Eosinophils Absolute: 0.1 K/uL (ref 0.0–0.5)
Eosinophils Relative: 2 %
HCT: 29.9 % — ABNORMAL LOW (ref 36.0–46.0)
Hemoglobin: 9.2 g/dL — ABNORMAL LOW (ref 12.0–15.0)
Immature Granulocytes: 1 %
Lymphocytes Relative: 26 %
Lymphs Abs: 1.8 K/uL (ref 0.7–4.0)
MCH: 31.7 pg (ref 26.0–34.0)
MCHC: 30.8 g/dL (ref 30.0–36.0)
MCV: 103.1 fL — ABNORMAL HIGH (ref 80.0–100.0)
Monocytes Absolute: 0.7 K/uL (ref 0.1–1.0)
Monocytes Relative: 10 %
Neutro Abs: 4.2 K/uL (ref 1.7–7.7)
Neutrophils Relative %: 60 %
Platelets: 356 K/uL (ref 150–400)
RBC: 2.9 MIL/uL — ABNORMAL LOW (ref 3.87–5.11)
RDW: 17.3 % — ABNORMAL HIGH (ref 11.5–15.5)
WBC: 6.8 K/uL (ref 4.0–10.5)
nRBC: 0 % (ref 0.0–0.2)

## 2023-10-26 LAB — GLUCOSE, CAPILLARY
Glucose-Capillary: 99 mg/dL (ref 70–99)
Glucose-Capillary: 106 mg/dL — ABNORMAL HIGH (ref 70–99)
Glucose-Capillary: 125 mg/dL — ABNORMAL HIGH (ref 70–99)
Glucose-Capillary: 121 mg/dL — ABNORMAL HIGH (ref 70–99)

## 2023-10-26 MED ORDER — TOPIRAMATE 25 MG PO TABS
25.0000 mg | ORAL_TABLET | Freq: Two times a day (BID) | ORAL | Status: DC
  Administered 2023-10-26 – 2023-10-27 (×3): 25 mg via ORAL
  Filled 2023-10-26 (×3): qty 1

## 2023-10-26 MED ORDER — LITHIUM CARBONATE 150 MG PO CAPS
150.0000 mg | ORAL_CAPSULE | Freq: Two times a day (BID) | ORAL | Status: DC
  Administered 2023-10-26 – 2023-10-27 (×3): 150 mg via ORAL
  Filled 2023-10-26 (×4): qty 1

## 2023-10-26 MED ORDER — TRAZODONE HCL 50 MG PO TABS
50.0000 mg | ORAL_TABLET | Freq: Once | ORAL | Status: AC
  Administered 2023-10-26: 50 mg via ORAL
  Filled 2023-10-26: qty 1

## 2023-10-26 NOTE — Progress Notes (Signed)
 Physical Therapy Treatment Patient Details Name: Tracey [name] MRN: [id number] DOB: [birth year] Today's Date: 10/26/2023   History of Present Illness Patient is an 83 yo female admitted with SBO. On 10/07/2023 pt s/p diagnostic laparoscopy with LOA and repair of prior small bowel enterotomy. PMH: cognitive impairment, bipolar disorder, possible Parkinsons disease and h/o rection of GIST tumor, glaucoma, HTN    PT Comments  AxO x 2 Hx Dementia but following all commands and able to express her needs.  Retired Environmental manager.  Resides at [facility] ALF Assisted OOB.  General bed mobility comments: assist for upper body supine to sit and assist for lower body back to bed. General transfer comment: VC's on proper hand placement and safety with turns while assisted in bathroom.  Also required assist to correctly traget/sit on toilet and VC's to use rail on wall.  Assisted with peri care standing for balance instability.  Static standing at sink to wash hands with a little direction on location of hands soap/drying towel.  Assist with back steps as Pt presents instability and delayed corrective reaction.  HIGH FALL RISK. General Gait Details: slight trunk flexion, min cues for safety, posture and RW management slow gait with slight unsteadiness with turns needing assist to maintain correct walker distance to self.  Assisted back to bed and positioned to comfort.   Prior to admit, Pt was amb IND with her Rollator great distances for example to and from the dinning room.  LPT has rec Pt will need ST Rehab at SNF to address mobility and functional decline prior to safely returning home.    If plan is discharge home, recommend the following: A little help with walking and/or transfers;A little help with bathing/dressing/bathroom;Assistance with cooking/housework;Assist for transportation   Can travel by private vehicle     Yes  Equipment Recommendations  None recommended by PT    Recommendations  for Other Services       Precautions / Restrictions Precautions Precautions: Fall Precaution/Restrictions Comments: recent LAP Restrictions Weight Bearing Restrictions Per Provider Order: No     Mobility  Bed Mobility Overal bed mobility: Needs Assistance Bed Mobility: Sit to Supine, Supine to Sit     Supine to sit: Min assist, Mod assist Sit to supine: Mod assist   General bed mobility comments: assist for upper body supine to sit and assist for lower body back to bed.    Transfers Overall transfer level: Needs assistance Equipment used: Rolling walker (2 wheels) Transfers: Sit to/from Stand Sit to Stand: Min assist           General transfer comment: VC's on proper hand placement and safety with turns while assisted in bathroom.  Also required assist to correctly traget/sit on toilet and VC's to use rail on wall.  Assisted with peri care standing for balance instability.  Static standing at sink to wash hands with a little direction on location of hands soap/drying towel.  Assist with back steps as Pt presents instability and delayed corrective reaction.  HIGH FALL RISK.    Ambulation/Gait Ambulation/Gait assistance: Min assist, Contact guard assist Gait Distance (Feet): 64 Feet Assistive device: Rolling walker (2 wheels) Gait Pattern/deviations: Trunk flexed, Step-through pattern Gait velocity: decreased     General Gait Details: slight trunk flexion, min cues for safety, posture and RW management slow gait with slight unsteadiness with turns needing assist to maintain correct walker distance to self.   Stairs  Wheelchair Mobility     Tilt Bed    Modified Rankin (Stroke Patients Only)       Balance                                            Communication Communication Communication: No apparent difficulties  Cognition Arousal: Alert Behavior During Therapy: WFL for tasks assessed/performed   PT - Cognitive  impairments: History of cognitive impairments                       PT - Cognition Comments: AxO x 2 Hx Dementia but following all commands and able to express her needs.  Retired Environmental manager.  Resides at [facility]F Following commands: Intact      Cueing Cueing Techniques: Verbal cues  Exercises      General Comments        Pertinent Vitals/Pain Pain Assessment Pain Assessment: Faces Faces Pain Scale: Hurts a little bit Pain Location: ABD Pain Descriptors / Indicators: Tender Pain Intervention(s): Monitored during session    Home Living                          Prior Function            PT Goals (current goals can now be found in the care plan section) Progress towards PT goals: Progressing toward goals    Frequency    Min 2X/week      PT Plan      Co-evaluation              AM-PAC PT 6 Clicks Mobility   Outcome Measure  Help needed turning from your back to your side while in a flat bed without using bedrails?: A Lot Help needed moving from lying on your back to sitting on the side of a flat bed without using bedrails?: A Lot Help needed moving to and from a bed to a chair (including a wheelchair)?: A Lot Help needed standing up from a chair using your arms (e.g., wheelchair or bedside chair)?: A Lot Help needed to walk in hospital room?: A Lot Help needed climbing 3-5 steps with a railing? : A Lot 6 Click Score: 12    End of Session Equipment Utilized During Treatment: Gait belt Activity Tolerance: Patient limited by fatigue Patient left: in bed;with call bell/phone within reach;with family/visitor present;with bed alarm set Nurse Communication: Mobility status PT Visit Diagnosis: Muscle weakness (generalized) (M62.81);Other abnormalities of gait and mobility (R26.89);Unsteadiness on feet (R26.81);Other symptoms and signs involving the nervous system (R29.898)     Time: 1446-1510 PT Time Calculation (min)  (ACUTE ONLY): 24 min  Charges:    $Gait Training: 8-22 mins $Therapeutic Activity: 8-22 mins PT General Charges $$ ACUTE PT VISIT: 1 Visit                     Katheryn Leap  PTA Acute  Rehabilitation Services Office M-F          [phone]

## 2023-10-26 NOTE — Progress Notes (Signed)
 19 Days Post-Op   Subjective/Chief Complaint: Two BM yesterday No nausea or vomiting Tolerated full liquids without difficulty   Objective: Vital signs in last 24 hours: Temp:  [98 F (36.7 C)-99 F (37.2 C)] 98 F (36.7 C) (10/12 0443) Pulse Rate:  [62-65] 62 (10/12 0443) Resp:  [16-18] 17 (10/12 0443) BP: (123-143)/(53-73) 143/53 (10/12 0443) SpO2:  [100 %] 100 % (10/12 0443) Last BM Date : 10/25/23  Intake/Output from previous day: 10/11 0701 - 10/12 0700 In: 250 [P.O.:240; I.V.:10] Out: 850 [Urine:850] Intake/Output this shift: No intake/output data recorded.  General: Pleasant female who is laying in bed in NAD. HEENT: Head is normocephalic, atraumatic. Heart: HR normal. Lungs: Respiratory effort nonlabored. Abd: Soft, NT, ND. Incision c/d/i with steri strips. No rebound tenderness or guarding. Skin: Warm and dry.  Lab Results:  Recent Labs    10/25/23 0312 10/26/23 0312  WBC 6.5 6.8  HGB 8.5* 9.2*  HCT 27.4* 29.9*  PLT 367 356   BMET Recent Labs    10/25/23 0312  NA 141  K 4.2  CL 109  CO2 25  GLUCOSE 120*  BUN 29*  CREATININE 0.62  CALCIUM 10.4*    Studies/Results: CT ABDOMEN PELVIS W CONTRAST Result Date: 10/24/2023 CLINICAL DATA:  Small bowel obstruction and postoperative ileus EXAM: CT ABDOMEN AND PELVIS WITH CONTRAST TECHNIQUE: Multidetector CT imaging of the abdomen and pelvis was performed using the standard protocol following bolus administration of intravenous contrast. RADIATION DOSE REDUCTION: This exam was performed according to the departmental dose-optimization program which includes automated exposure control, adjustment of the mA and/or kV according to patient size and/or use of iterative reconstruction technique. CONTRAST:  OMNIPAQUE  IOHEXOL  300 MG/ML  SOLN COMPARISON:  CT abdomen and pelvis dated 10/16/2023 FINDINGS: Lower chest: No focal consolidation or pulmonary nodule in the lung bases. Decreased trace right pleural  effusion. Resolution of left pleural effusion. Partially imaged heart size is normal. Hepatobiliary: Unchanged scattered subcentimeter hypodensities, too small to characterize. No intra or extrahepatic biliary ductal dilation. Normal gallbladder. Pancreas: No focal lesions or main ductal dilation. 8 mm hypodensity near the pancreatic head (2:25). No main pancreatic ductal dilation. Spleen: Normal in size without focal abnormality. Adrenals/Urinary Tract: No adrenal nodules. No suspicious renal mass, calculi or hydronephrosis. Left renal simple and minimally complicated cysts. Additional bilateral subcentimeter hypodensities, too small to characterize. Mildly distended urinary bladder contains intraluminal gas. Stomach/Bowel: Small hiatal hernia. Normal appearance of the stomach. No evidence of bowel wall thickening, distention, or inflammatory changes. Enteric contrast material is seen within distal small bowel loops and reaches the level of the rectum. Appendix is not discretely seen. Vascular/Lymphatic: Aortic atherosclerosis. No enlarged abdominal or pelvic lymph nodes. Reproductive: No adnexal masses. Other: Pelvic fluid collection measures 2.7 x 0.7 cm (2:64), previously 6.4 x 1.9 cm. Previously noted irregular collections in the left hemiabdomen are no longer seen. No free air. Musculoskeletal: No acute or abnormal lytic or blastic osseous lesions. Multilevel degenerative changes of the partially imaged thoracic and lumbar spine. Postsurgical changes of the anterior abdominal wall. Increased size of lobulated densities along the surgical incision measuring up to 2.6 x 2.1 cm (2:55) at the inferior aspect. IMPRESSION: 1. No evidence of bowel obstruction. Enteric contrast material is seen within distal small bowel loops and reaches the level of the rectum. 2. Decreased size of pelvic fluid collection measuring 2.7 x 0.7 cm, previously 6.4 x 1.9 cm. Previously noted irregular collections in the left hemiabdomen  are no longer seen.  3. Increased size of lobulated densities along the surgical incision measuring up to 2.6 x 2.1 cm at the inferior aspect, which may represent hematomas or seromas. 4. Decreased trace right pleural effusion. Resolution of left pleural effusion. 5. Mildly distended urinary bladder contains intraluminal gas, which may be related to recent instrumentation. 6.  Aortic Atherosclerosis (ICD10-I70.0). Electronically Signed   By: Limin  Xu M.D.   On: 10/24/2023 18:59    Anti-infectives: Anti-infectives (From admission, onward)    Start     Dose/Rate Route Frequency Ordered Stop   10/14/23 0930  ceFEPIme (MAXIPIME) 2 g in sodium chloride  0.9 % 100 mL IVPB        2 g 200 mL/hr over 30 Minutes Intravenous Every 12 hours 10/14/23 0820 10/24/23 1500   10/14/23 0900  metroNIDAZOLE (FLAGYL) IVPB 500 mg        500 mg 100 mL/hr over 60 Minutes Intravenous 2 times daily 10/14/23 0811 10/24/23 1610   10/07/23 0915  cefoTEtan  (CEFOTAN ) 2 g in sodium chloride  0.9 % 100 mL IVPB        2 g 200 mL/hr over 30 Minutes Intravenous On call to O.R. 10/07/23 0820 10/07/23 1938       Assessment/Plan: SBO POD 19 s/p dx lap converted to ex lap with LOA and repair of enterotomy, Dr. Dasie 9/23. - CT AP 9/29 reviewed independently and with IR - pelvic collection does appear to be developing and seems to communicate with other small collections but is too early to drain - CT repeated 10/2 showed intra-abdominal collection is smaller, but it does appear more mature/thick-walled. Clinically the patient has a resolving ileus and her leukocytosis has resolved. Discussed with IR, will hold off on drainage since collection is smaller and patient improving, if there is a clinical change they can re-consider drainage. - TPN discontinued 10/9. - Patient had episode of emesis 10/9. Xray showed post operative changes and nonobstructive bowel gas pattern. Having bowel movements with flatulence.  - CT 10/10 showed no  sign of obstruction and resolving pelvic fluid collection. - Advance diet as tolerated - proceed with discharge planning per primary team.  No further surgical issues     FEN: Soft low fiber diet VTE: Heparin  infusion  ID: Cefepime/Metronidazole completed 10/9.     - Per [facility] - Dementia  GERD Tremors  HTN  Anxiety  A fib RVR - on amio gtt, heparin  gtt  Anemia    LOS: 25 days    Donnice MARLA Lima 10/26/2023

## 2023-10-26 NOTE — Progress Notes (Addendum)
  Progress Note   Patient: Tracey [name] FMW:[id number] DOB: May 19, 1940 DOA: 10/01/2023     25 DOS: the patient was seen and examined on 10/26/2023   Brief hospital course: 83 year old woman PMH including bipolar disorder, cognitive impairment, presenting with abdominal pain, admitted for SBO, failed conservative management and underwent operative intervention 9/23, hospitalization complicated by pelvic abscess treated conservatively with antibiotics, and prolonged ileus.  Consultants General surgery Cardiology   Procedures/Events 9/17 admission for SBO 9/23 ex lap w/ LOA, repair of small bowel enterotomy 9/29 worsening leukocytosis, CT showed persistent SBO, small fluid collections, colitis.  Started on antibiotics.  Clinically felt to have ileus.  Assessment and Plan: Small bowel obstruction Postoperative ileus  Pelvic abscess Failed conservative management, status post diagnostic and laparoscopic, lysis of adhesions, repair of small bowel enterotomy 9/23. Course complicated by prolonged ileus, intra-abdominal abscesses noted on CT 9/29 Completed antibiotics, per general surgery patient is now cleared for discharge    Postoperative blood loss anemia Received IV iron  during hospitalization Hemoglobin stable.   Paroxysmal atrial fibrillation with rapid ventricular response, new diagnosis Demand ischemia In the setting of complicated postoperative course Continue amiodarone , apixaban Follow-up with Dr. Levern as an outpatient   Bipolar disorder  Dementia without behavioral disturbance Can resume lithium  and Topamax    Acute urinary retention resolved   Severe malnutrition Body mass index is 21.9 kg/m.  Aortic atherosclerosis  Bronchiectasis   7 mm posterior right upper lobe pulmonary nodule  Non-contrast chest CT at 6-12 months is recommended.     Subjective:  I want to go home  Physical Exam: Vitals:   10/25/23 1357 10/25/23 2014 10/26/23 0443 10/26/23  1413  BP: 123/66 (!) 140/73 (!) 143/53 (!) 120/59  Pulse: 65 64 62 60  Resp: 16 18 17 17   Temp: 98.1 F (36.7 C) 99 F (37.2 C) 98 F (36.7 C) 97.8 F (36.6 C)  TempSrc: Oral Oral  Oral  SpO2: 100% 100% 100% 100%  Weight:      Height:       Physical Exam Vitals reviewed.  Constitutional:      General: She is not in acute distress.    Appearance: She is not ill-appearing or toxic-appearing.  Cardiovascular:     Rate and Rhythm: Normal rate and regular rhythm.     Heart sounds: No murmur heard. Pulmonary:     Effort: Pulmonary effort is normal. No respiratory distress.     Breath sounds: No wheezing, rhonchi or rales.  Neurological:     Mental Status: She is alert.  Psychiatric:        Mood and Affect: Mood normal.        Behavior: Behavior normal.     Data Reviewed: CBG stable Hgb stable 9.2  Family Communication: none  Disposition: Status is: Inpatient Remains inpatient appropriate because: await SNF     Time spent: 20 minutes  Author: Toribio Door, MD 10/26/2023 5:52 PM  For on call review [email or web address].

## 2023-10-26 NOTE — Plan of Care (Signed)
  Problem: Activity: Goal: Risk for activity intolerance will decrease Outcome: Progressing   Problem: Nutrition: Goal: Adequate nutrition will be maintained Outcome: Progressing   Problem: Elimination: Goal: Will not experience complications related to bowel motility Outcome: Progressing   Problem: Pain Managment: Goal: General experience of comfort will improve and/or be controlled Outcome: Progressing   Problem: Safety: Goal: Ability to remain free from injury will improve Outcome: Progressing   Problem: Coping: Goal: Level of anxiety will decrease Outcome: Not Progressing

## 2023-10-27 DIAGNOSIS — F319 Bipolar disorder, unspecified: Secondary | ICD-10-CM

## 2023-10-27 DIAGNOSIS — K9189 Other postprocedural complications and disorders of digestive system: Secondary | ICD-10-CM | POA: Diagnosis not present

## 2023-10-27 DIAGNOSIS — N739 Female pelvic inflammatory disease, unspecified: Secondary | ICD-10-CM

## 2023-10-27 DIAGNOSIS — F0390 Unspecified dementia without behavioral disturbance: Secondary | ICD-10-CM | POA: Diagnosis not present

## 2023-10-27 DIAGNOSIS — K56609 Unspecified intestinal obstruction, unspecified as to partial versus complete obstruction: Secondary | ICD-10-CM | POA: Diagnosis not present

## 2023-10-27 LAB — CBC WITH DIFFERENTIAL/PLATELET
Abs Immature Granulocytes: 0.05 K/uL (ref 0.00–0.07)
Basophils Absolute: 0 K/uL (ref 0.0–0.1)
Basophils Relative: 1 %
Eosinophils Absolute: 0.1 K/uL (ref 0.0–0.5)
Eosinophils Relative: 2 %
HCT: 28.8 % — ABNORMAL LOW (ref 36.0–46.0)
Hemoglobin: 8.8 g/dL — ABNORMAL LOW (ref 12.0–15.0)
Immature Granulocytes: 1 %
Lymphocytes Relative: 22 %
Lymphs Abs: 1.5 K/uL (ref 0.7–4.0)
MCH: 31.7 pg (ref 26.0–34.0)
MCHC: 30.6 g/dL (ref 30.0–36.0)
MCV: 103.6 fL — ABNORMAL HIGH (ref 80.0–100.0)
Monocytes Absolute: 0.5 K/uL (ref 0.1–1.0)
Monocytes Relative: 7 %
Neutro Abs: 4.7 K/uL (ref 1.7–7.7)
Neutrophils Relative %: 67 %
Platelets: 318 K/uL (ref 150–400)
RBC: 2.78 MIL/uL — ABNORMAL LOW (ref 3.87–5.11)
RDW: 17.1 % — ABNORMAL HIGH (ref 11.5–15.5)
WBC: 6.8 K/uL (ref 4.0–10.5)
nRBC: 0 % (ref 0.0–0.2)

## 2023-10-27 LAB — GLUCOSE, CAPILLARY
Glucose-Capillary: 100 mg/dL — ABNORMAL HIGH (ref 70–99)
Glucose-Capillary: 136 mg/dL — ABNORMAL HIGH (ref 70–99)
Glucose-Capillary: 92 mg/dL (ref 70–99)
Glucose-Capillary: 118 mg/dL — ABNORMAL HIGH (ref 70–99)

## 2023-10-27 MED ORDER — LIDOCAINE 5 % EX PTCH: 1.0000 | MEDICATED_PATCH | CUTANEOUS | Status: AC

## 2023-10-27 MED ORDER — AMIODARONE HCL 100 MG PO TABS: 100.0000 mg | ORAL_TABLET | Freq: Every day | ORAL | Status: AC

## 2023-10-27 MED ORDER — ALPRAZOLAM 0.5 MG PO TABS: 0.5000 mg | ORAL_TABLET | Freq: Two times a day (BID) | ORAL | 0 refills | Status: DC | PRN

## 2023-10-27 MED ORDER — APIXABAN 2.5 MG PO TABS: 2.5000 mg | ORAL_TABLET | Freq: Two times a day (BID) | ORAL | Status: AC

## 2023-10-27 MED ORDER — ADULT MULTIVITAMIN W/MINERALS CH: 1.0000 | ORAL_TABLET | Freq: Every day | ORAL | Status: AC

## 2023-10-27 MED ORDER — ADULT MULTIVITAMIN W/MINERALS CH: 1.0000 | ORAL_TABLET | Freq: Every day | ORAL | Status: DC

## 2023-10-27 NOTE — Discharge Summary (Signed)
 Physician Discharge Summary   Patient: Tracey [name] MRN: [id number] DOB: 04-Nov-1940  Admit date:     10/01/2023  Discharge date: 10/27/23  Discharge Physician: Toribio Door   PCP: Mast, Man X, NP   Recommendations at discharge:   Small bowel obstruction Postoperative ileus  Pelvic abscess Completed antibiotics, per general surgery patient is cleared for discharge    Paroxysmal atrial fibrillation with rapid ventricular response, new diagnosis Follow-up with Dr. Levern as an outpatient   Acute urinary retention resolved   Severe malnutrition   7 mm posterior right upper lobe pulmonary nodule  Non-contrast chest CT at 6-12 months is recommended.   Discharge Diagnoses: Principal Problem:   SBO (small bowel obstruction) (HCC) Active Problems:   Difficulty hearing   Essential (primary) hypertension   GERD (gastroesophageal reflux disease)   Major neurocognitive disorder (HCC)   Protein-calorie malnutrition, severe   New onset a-fib (HCC)   Dementia without behavioral disturbance (HCC)   Acute postoperative anemia due to expected blood loss   Iron  deficiency anemia   Postoperative ileus (HCC)   Pelvic abscess in female   Demand ischemia (HCC)   Bipolar disease, chronic (HCC)   Bronchiectasis (HCC)   Pulmonary nodule  Resolved Problems:   * No resolved hospital problems. *  Hospital Course: 83 year old woman PMH including bipolar disorder, cognitive impairment, presenting with abdominal pain, admitted for SBO, failed conservative management and underwent operative intervention 9/23, hospitalization complicated by pelvic abscess treated conservatively with antibiotics, and prolonged ileus.  Consultants General surgery Cardiology   Procedures/Events 9/17 admission for SBO 9/23 ex lap w/ LOA, repair of small bowel enterotomy 9/29 worsening leukocytosis, CT showed persistent SBO, small fluid collections, colitis.  Started on antibiotics.  Clinically felt to have  ileus.  Small bowel obstruction Postoperative ileus  Pelvic abscess Failed conservative management, status post diagnostic and laparoscopic, lysis of adhesions, repair of small bowel enterotomy 9/23. Course complicated by prolonged ileus, intra-abdominal abscesses noted on CT 9/29 Completed antibiotics, per general surgery patient is cleared for discharge    Postoperative blood loss anemia Received IV iron  during hospitalization Hemoglobin stable.   Paroxysmal atrial fibrillation with rapid ventricular response, new diagnosis Demand ischemia In the setting of complicated postoperative course Continue amiodarone , apixaban Follow-up with Dr. Levern as an outpatient   Bipolar disorder  Dementia without behavioral disturbance Can resume lithium  and Topamax    Acute urinary retention resolved   Severe malnutrition Body mass index is 21.9 kg/m.   Aortic atherosclerosis   Bronchiectasis    7 mm posterior right upper lobe pulmonary nodule  Non-contrast chest CT at 6-12 months is recommended.      Pain control - [place]  Controlled Substance Reporting System database was reviewed.   Disposition: Skilled nursing facility Diet recommendation:  Soft diet  DISCHARGE MEDICATION: Allergies as of 10/27/2023       Reactions   Lisinopril Cough   Penicillins Itching, Other (See Comments)   50 years ago   Adhesive [tape] Itching   Atorvastatin Itching   Dilaudid  [hydromorphone  Hcl] Itching        Medication List     STOP taking these medications    aspirin  81 MG chewable tablet       TAKE these medications    acetaminophen  325 MG tablet Commonly known as: TYLENOL  Take 650 mg by mouth every 4 (four) hours as needed (for pain).   ALPRAZolam  0.5 MG tablet Commonly known as: XANAX  Take 1 tablet (0.5 mg total) by mouth 2 (  two) times daily as needed for anxiety. What changed:  when to take this reasons to take this   amiodarone  100 MG tablet Commonly  known as: PACERONE  Take 1 tablet (100 mg total) by mouth daily. Start taking on: October 28, 2023   amoxicillin 500 MG tablet Commonly known as: AMOXIL Take 2,000 mg by mouth as directed. 1 hour prior to dental procedure per [facility] from [facility]   apixaban 2.5 MG Tabs tablet Commonly known as: ELIQUIS Take 1 tablet (2.5 mg total) by mouth 2 (two) times daily.   cycloSPORINE  0.05 % ophthalmic emulsion Commonly known as: RESTASIS  Place 1 drop into both eyes 2 (two) times daily.   lactose free nutrition Liqd Take 237 mLs by mouth See admin instructions. Drink 237 ml's by mouth in the morning and afternoon   latanoprost  0.005 % ophthalmic solution Commonly known as: XALATAN  Place 1 drop into both eyes at bedtime.   lidocaine  5 % Commonly known as: LIDODERM  Place 1 patch onto the skin daily. Remove & Discard patch within 12 hours or as directed by MD Start taking on: October 28, 2023   lithium  carbonate 150 MG capsule TAKE ONE CAPSULE BY MOUTH TWICE DAILY WITH MEALS   losartan  50 MG tablet Commonly known as: COZAAR  TAKE ONE TABLET BY MOUTH ONCE DAILY   multivitamin with minerals Tabs tablet Take 1 tablet by mouth daily. Start taking on: October 28, 2023   ondansetron  4 MG tablet Commonly known as: ZOFRAN  Take 4 mg by mouth every 6 (six) hours as needed for nausea.   polyethylene glycol 17 g packet Commonly known as: MIRALAX  / GLYCOLAX  Take 17 g by mouth every other day.   pravastatin  10 MG tablet Commonly known as: PRAVACHOL  Take 5 mg by mouth every evening.   Systane 0.4-0.3 % Soln Generic drug: Polyethyl Glycol-Propyl Glycol Place 1 drop into both eyes every 4 (four) hours as needed (for dryness).   Systane Nighttime Oint Place 1 application  into both eyes at bedtime.   Timolol  Maleate (Once-Daily) 0.5 % Soln Place 1 drop into both eyes daily.   topiramate  25 MG tablet Commonly known as: TOPAMAX  TAKE ONE TABLET BY MOUTH TWICE DAILY IN THE MORNING  AND IN THE EVENING What changed: See the new instructions.               Discharge Care Instructions  (From admission, onward)           Start     Ordered   10/27/23 0000  Discharge wound care:       Comments: Per general surgery   10/27/23 1532            Follow-up Information     [facility]. Schedule an appointment as soon as possible for a visit in 2 week(s).   Contact information: [address]  [phone]               Discharge Exam: Tracey [name]   10/24/23 0359 10/25/23 0500 10/27/23 0351  Weight: 54.2 kg 54.3 kg 50.7 kg   Physical Exam Vitals reviewed.  Constitutional:      General: She is not in acute distress.    Appearance: She is not ill-appearing or toxic-appearing.  Cardiovascular:     Rate and Rhythm: Normal rate and regular rhythm.     Heart sounds: No murmur heard. Pulmonary:     Effort: Pulmonary effort is normal. No respiratory distress.  Breath sounds: No wheezing, rhonchi or rales.  Neurological:     Mental Status: She is alert.  Psychiatric:        Mood and Affect: Mood normal.        Behavior: Behavior normal.      Condition at discharge: good  The results of significant diagnostics from this hospitalization (including imaging, microbiology, ancillary and laboratory) are listed below for reference.   Imaging Studies: CT ABDOMEN PELVIS W CONTRAST Result Date: 10/24/2023 CLINICAL DATA:  Small bowel obstruction and postoperative ileus EXAM: CT ABDOMEN AND PELVIS WITH CONTRAST TECHNIQUE: Multidetector CT imaging of the abdomen and pelvis was performed using the standard protocol following bolus administration of intravenous contrast. RADIATION DOSE REDUCTION: This exam was performed according to the departmental dose-optimization program which includes automated exposure control, adjustment of the mA and/or kV according to patient size and/or use of  iterative reconstruction technique. CONTRAST:  OMNIPAQUE  IOHEXOL  300 MG/ML  SOLN COMPARISON:  CT abdomen and pelvis dated 10/16/2023 FINDINGS: Lower chest: No focal consolidation or pulmonary nodule in the lung bases. Decreased trace right pleural effusion. Resolution of left pleural effusion. Partially imaged heart size is normal. Hepatobiliary: Unchanged scattered subcentimeter hypodensities, too small to characterize. No intra or extrahepatic biliary ductal dilation. Normal gallbladder. Pancreas: No focal lesions or main ductal dilation. 8 mm hypodensity near the pancreatic head (2:25). No main pancreatic ductal dilation. Spleen: Normal in size without focal abnormality. Adrenals/Urinary Tract: No adrenal nodules. No suspicious renal mass, calculi or hydronephrosis. Left renal simple and minimally complicated cysts. Additional bilateral subcentimeter hypodensities, too small to characterize. Mildly distended urinary bladder contains intraluminal gas. Stomach/Bowel: Small hiatal hernia. Normal appearance of the stomach. No evidence of bowel wall thickening, distention, or inflammatory changes. Enteric contrast material is seen within distal small bowel loops and reaches the level of the rectum. Appendix is not discretely seen. Vascular/Lymphatic: Aortic atherosclerosis. No enlarged abdominal or pelvic lymph nodes. Reproductive: No adnexal masses. Other: Pelvic fluid collection measures 2.7 x 0.7 cm (2:64), previously 6.4 x 1.9 cm. Previously noted irregular collections in the left hemiabdomen are no longer seen. No free air. Musculoskeletal: No acute or abnormal lytic or blastic osseous lesions. Multilevel degenerative changes of the partially imaged thoracic and lumbar spine. Postsurgical changes of the anterior abdominal wall. Increased size of lobulated densities along the surgical incision measuring up to 2.6 x 2.1 cm (2:55) at the inferior aspect. IMPRESSION: 1. No evidence of bowel obstruction. Enteric  contrast material is seen within distal small bowel loops and reaches the level of the rectum. 2. Decreased size of pelvic fluid collection measuring 2.7 x 0.7 cm, previously 6.4 x 1.9 cm. Previously noted irregular collections in the left hemiabdomen are no longer seen. 3. Increased size of lobulated densities along the surgical incision measuring up to 2.6 x 2.1 cm at the inferior aspect, which may represent hematomas or seromas. 4. Decreased trace right pleural effusion. Resolution of left pleural effusion. 5. Mildly distended urinary bladder contains intraluminal gas, which may be related to recent instrumentation. 6.  Aortic Atherosclerosis (ICD10-I70.0). Electronically Signed   By: Limin  Xu M.D.   On: 10/24/2023 18:59   DG Abd Portable 1V Result Date: 10/23/2023 CLINICAL DATA:  Nausea and vomiting EXAM: PORTABLE ABDOMEN - 1 VIEW COMPARISON:  CT abdomen and pelvis 10/16/2023 FINDINGS: Scattered gas and stool in the colon. Residual contrast material in the right lower quadrant, likely in the cecum. No small or large bowel distention. No radiopaque stones. Degenerative changes  in the lumbar spine and hips. Lumbar scoliosis convex towards the right. Surgical clips in the right pelvis. Skin staples along the midline consistent with recent surgery. No radiopaque stones. IMPRESSION: Nonobstructive bowel gas pattern.  Postoperative changes. Electronically Signed   By: Elsie Gravely M.D.   On: 10/23/2023 20:13   CT ABDOMEN PELVIS W CONTRAST Result Date: 10/16/2023 CLINICAL DATA:  Abdominal pain, post-op. EXAM: CT ABDOMEN AND PELVIS WITH CONTRAST TECHNIQUE: Multidetector CT imaging of the abdomen and pelvis was performed using the standard protocol following bolus administration of intravenous contrast. RADIATION DOSE REDUCTION: This exam was performed according to the departmental dose-optimization program which includes automated exposure control, adjustment of the mA and/or kV according to patient size  and/or use of iterative reconstruction technique. CONTRAST:  80mL OMNIPAQUE  IOHEXOL  300 MG/ML  SOLN COMPARISON:  CT scan abdomen and pelvis from 10/13/2023. FINDINGS: Lower chest: There is trace left pleural effusion, slightly decreased since the prior study and small right pleural effusion, similar to the prior study. There are associated atelectatic changes in the visualized bilateral lung bases. Normal heart size. No pericardial effusion. Hepatobiliary: The liver is normal in size. Non-cirrhotic configuration. No suspicious mass. Redemonstration of at least 3 hypoattenuating subcentimeter sized lesions in the liver, which are too small to adequately characterize but favored to represent cysts. There is a 5 mm hyperattenuating focus in the right hepatic dome (series 2, image 9), incompletely characterized on the current exam but favored to represent a flash filling hemangioma. No intrahepatic or extrahepatic bile duct dilation. Vicarious excretion of contrast noted within the gallbladder. No calcified gallstones. Normal gallbladder wall thickness. No pericholecystic inflammatory changes. Pancreas: Unremarkable. No pancreatic ductal dilatation or surrounding inflammatory changes. Spleen: Within normal limits. No focal lesion. Adrenals/Urinary Tract: Adrenal glands are unremarkable. There are several bilateral hypoattenuating structures, which are too small to adequately characterize. However, the largest structure in the left kidney interpolar region, anteriorly measuring up to 1.1 x 1.1 cm, can be characterized as a simple cyst. No nephroureterolithiasis or obstructive uropathy. Sinus cyst noted in the left kidney upper pole. Urinary bladder is decompressed secondary to Foley catheter. Stomach/Bowel: There is at least small sliding hiatal hernia noted. Enteric tube is noted with its tip in the proximal stomach body region. No disproportionate dilation of the small or large bowel loops. No evidence of abnormal  bowel wall thickening or inflammatory changes. The appendix was not visualized; however there is no acute inflammatory process in the right lower quadrant. Vascular/Lymphatic: There is mild interval decrease in the mild irregular hyperattenuating walled collection in the dependent pelvis currently measuring 3.1 x 6.6 cm (series 4, image 64). This may represent postoperative seroma or developing abscess. Correlate clinically. There are also multiple additional areas of fat stranding and very small noncommunicating mild irregular hyperattenuating walled collections in the left midabdomen abutting the descending colon which also exhibit mild-to-moderate irregular wall thickening at this level. (Series 2, image 39 and series 4, image 49). This is nonspecific. Differential diagnosis includes postsurgical changes, small developing abscesses, tumor deposits, etc. No pneumoperitoneum. No abdominal or pelvic lymphadenopathy, by size criteria. No aneurysmal dilation of the major abdominal arteries. There are mild peripheral atherosclerotic vascular calcifications of the aorta and its major branches. Reproductive: The uterus is surgically absent. No large adnexal mass. Other: Midline surgical scar noted. No anterior abdominal wall abscess/collection or hematoma. Anterior skin staples noted. The soft tissues and abdominal wall are otherwise unremarkable. Musculoskeletal: No suspicious osseous lesions. There are mild - moderate  multilevel degenerative changes in the visualized spine. IMPRESSION: 1. Interval resolution of previously seen bowel obstruction. 2. There is mild interval decrease in the mild irregular hyperattenuating walled collection in the dependent pelvis currently measuring 3.1 x 6.6 cm. This may represent postoperative seroma or developing abscess. 3. There are also multiple additional areas of fat stranding and very small noncommunicating mild irregular hyperattenuating walled collections in the left midabdomen  abutting the descending colon which also exhibit mild-to-moderate irregular wall thickening at this level. This is nonspecific. Differential diagnosis includes postsurgical changes, small developing abscesses, tumor deposits, etc. 4. Multiple other nonacute observations, as described above. Aortic Atherosclerosis (ICD10-I70.0). Electronically Signed   By: Ree Molt M.D.   On: 10/16/2023 14:45   CT ABDOMEN PELVIS W CONTRAST Result Date: 10/13/2023 EXAM: CT ABDOMEN AND PELVIS WITH CONTRAST 10/13/2023 12:06:33 PM TECHNIQUE: CT of the abdomen and pelvis was performed with the administration of 100 mL of iohexol  (OMNIPAQUE ) 300 MG/ML solution. Multiplanar reformatted images are provided for review. Automated exposure control, iterative reconstruction, and/or weight-based adjustment of the mA/kV was utilized to reduce the radiation dose to as low as reasonably achievable. COMPARISON: 10/05/2023 CLINICAL HISTORY: Abdominal pain, post-op. Evaluate progress of high-grade small bowel obstruction with transition point in the pelvis to the right of the midline and mild mesenteric edema. FINDINGS: LOWER CHEST: Slight increase in small/moderate effusions right greater than left. Dependent atelectasis posteriorly in the lung bases. Moderate hiatal hernia is partially visualized. LIVER: Stable probable small hepatic cysts in right hepatic lobe. No new lesion. GALLBLADDER AND BILE DUCTS: Gallbladder is physiologically distended. No biliary ductal dilatation. SPLEEN: No acute abnormality. PANCREAS: No acute abnormality. ADRENAL GLANDS: No acute abnormality. KIDNEYS, URETERS AND BLADDER: Multiple cortical lesions in kidneys, some of which can be characterized as simple cysts, largest 12 mm mid left kidney. Per consensus, no follow-up is needed for simple Bosniak type 1 and 2 renal cysts, unless the patient has a malignancy history or risk factors. No stones in the kidneys or ureters. No hydronephrosis. No perinephric or  periureteral stranding. The urinary bladder is distended. GI AND BOWEL: Gastric tube extends into the stomach, which is partially distended by gas and contrast material. Multiple dilated proximal and mid small bowel loops with transition point in the right lower quadrant. Decompressed loops of distal small bowel. There is physiologic distention of the right colon. The descending and sigmoid segments of the colon are decompressed with some mild regional inflammatory/edematous changes, new since previous, and regional scattered peripheral enhancing small fluid collections. PERITONEUM AND RETROPERITONEUM: No ascites. No free air. Scattered peripheral enhancing small fluid collections are noted, particularly in the region of the descending and sigmoid colon. VASCULATURE: Aorta is normal in caliber. Mild scattered iliac calcified plaque without aneurysm. LYMPH NODES: No lymphadenopathy. REPRODUCTIVE ORGANS: No acute abnormality. BONES AND SOFT TISSUES: Midline skin staples below the umbilicus. Surgical clips in the right pelvis as before. Degenerative disc disease L5-S1. No acute osseous abnormality. No focal soft tissue abnormality. IMPRESSION: 1. Persistent small bowel obstruction with transition point in the right lower quadrant. 2. descending and sigmoid colon decompressed with new mild regional inflammatory/edematous changes and scattered peripheral enhancing small fluid collections since prior, suggesting colitis. 3. Small to moderate bilateral pleural effusions, right greater than left, with dependent basilar atelectasis. Electronically signed by: Dayne Hassell MD 10/13/2023 04:05 PM EDT RP Workstation: [id number]   DG CHEST PORT 1 VIEW Result Date: 10/08/2023 EXAM: 1 VIEW(S) XRAY OF THE CHEST 10/08/2023 01:43:00 PM COMPARISON: 10/07/2023 CLINICAL  HISTORY: Encounter for assessment of peripherally inserted central catheter (PICC) [id number]. Encounter for PICC line placement. FINDINGS: LINES, TUBES AND DEVICES:  Right upper extremity PICC repositioned. The tip now overlies right atrium. Stable enteric tube. LUNGS AND PLEURA: Bilateral hazy opacities. Small bilateral pleural effusions with associated atelectasis. No pulmonary edema. No pneumothorax. HEART AND MEDIASTINUM: No acute abnormality of the cardiac and mediastinal silhouettes. BONES AND SOFT TISSUES: No acute osseous abnormality. IMPRESSION: 1. Right upper extremity PICC tip repositioned. The tip now overlies the right atrium. 2. Stable enteric tube. 3. Bilateral small pleural effusions with associated atelectasis. 4. Bilateral hazy pulmonary opacities likely reflect aggressive behavior edema Infection is not excluded. Electronically signed by: Lonni Necessary MD 10/08/2023 02:06 PM EDT RP Workstation: [id number]   DG Abd 1 View Result Date: 10/08/2023 CLINICAL DATA:  Nasogastric tube placement. EXAM: ABDOMEN - 1 VIEW COMPARISON:  10/07/2023 at 5:17 a.m. FINDINGS: Nasogastric tube has tip and side-port over the stomach in the left upper quadrant. Previously seen dilated air-filled small bowel loops over the upper abdomen not present on the current exam. No free peritoneal air. Skin staples over the lower midline abdomen. Perihilar linear density likely atelectasis. Possible small amount left pleural fluid. Remainder of the exam is unchanged. IMPRESSION: 1. Nasogastric tube with tip and side-port over the stomach in the left upper quadrant. 2. Previously seen dilated air-filled small bowel loops over the upper abdomen not present on the current exam. Electronically Signed   By: Toribio Agreste M.D.   On: 10/08/2023 12:50   DG Chest 1 View Result Date: 10/07/2023 CLINICAL DATA:  Status post PICC placement EXAM: CHEST  1 VIEW COMPARISON:  Chest x-ray 10/07/2023 FINDINGS: Right upper extremity distal PICC line is seen projecting cranially in the right neck. The distal tip is not included on the image. This is unchanged from prior. Enteric tube is partially  visualized. The heart is mildly enlarged. There small bilateral pleural effusions and minimal bibasilar infiltrates. No pneumothorax or acute fracture. IMPRESSION: 1. Malpositioned right upper extremity distal PICC line is seen projecting cranially in the right neck. The distal tip is not included on the image. This is unchanged from prior. 2. Small bilateral pleural effusions and minimal bibasilar infiltrates. Electronically Signed   By: Greig Pique M.D.   On: 10/07/2023 19:26   DG CHEST PORT 1 VIEW Result Date: 10/07/2023 CLINICAL DATA:  PICC line placement EXAM: PORTABLE CHEST 1 VIEW COMPARISON:  10/04/2023 FINDINGS: Right PICC line courses superiorly into the right internal jugular vein and projects off the superior aspect of the image. Tip is not visualized. NG tube is in the stomach. Heart and mediastinal contours are within normal limits. Worsening bilateral lower lobe airspace disease and probable layering effusions. No acute bony abnormality. IMPRESSION: Right PICC line courses superiorly into the right internal jugular vein and projects off the superior aspect of the image. Layering bilateral effusions with worsening bilateral lower lobe airspace disease. Electronically Signed   By: Franky Crease M.D.   On: 10/07/2023 18:36   US  EKG SITE RITE Result Date: 10/07/2023 If Site Rite image not attached, placement could not be confirmed due to current cardiac rhythm.  DG Abd 1 View Result Date: 10/07/2023 CLINICAL DATA:  Follow-up small bowel obstruction. EXAM: ABDOMEN - 1 VIEW COMPARISON:  10/04/2023 and abdomen and pelvis CT dated 10/05/2023. FINDINGS: Again demonstrated are multiple dilated small bowel loops. These currently measure up to 5.0 cm in maximum diameter, previously 5.9 cm on 10/04/2023. No significant colon  gas. A nasogastric tube remains in place with its tip in the mid stomach and side hole in the proximal to mid stomach. Stable thoracolumbar scoliosis and degenerative changes.  IMPRESSION: Persistent small bowel obstruction with mild improvement. Electronically Signed   By: Elspeth Bathe M.D.   On: 10/07/2023 09:59   CT CHEST ABDOMEN PELVIS WO CONTRAST Result Date: 10/05/2023 CLINICAL DATA:  Respiratory illness. Bowel obstruction. 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 EXAM: CT CHEST, ABDOMEN AND PELVIS WITHOUT CONTRAST TECHNIQUE: Multidetector CT imaging of the chest, abdomen and pelvis was performed following the standard protocol without IV contrast. RADIATION DOSE REDUCTION: This exam was performed according to the departmental dose-optimization program which includes automated exposure control, adjustment of the mA and/or kV according to patient size and/or use of iterative reconstruction technique. COMPARISON:  Abdomen pelvis CT 10/01/2023.  Chest CT 04/24/2012 FINDINGS: CT CHEST FINDINGS Cardiovascular: The heart size is normal. No substantial pericardial effusion. Mild atherosclerotic calcification is noted in the wall of the thoracic aorta. Mediastinum/Nodes: No mediastinal lymphadenopathy. NG tube visualized in the esophagus. Mild circumferential wall thickening noted distal esophagus just proximal to a moderate hiatal hernia. No evidence for gross hilar lymphadenopathy although assessment is limited by the lack of intravenous contrast on the current study. The esophagus has normal imaging features. Lungs/Pleura: 7 mm posterior right upper lobe nodule on 51/6 is new in the long interval since previous chest CT. Small airway impaction with tree-in-bud nodularity and patchy airspace disease noted in the posterior left upper lobe/lingula and dependent left lower lobe. Bronchiectasis with volume loss and consolidative opacity in the right middle lobe is progressive in the long interval since the prior study. There is some airway impaction and subtle clustered nodularity in the inferior right upper lobe. Patchy ill-defined nodular airspace  opacity in the right lower lobe is associated with dependent collapse/consolidation. Small bilateral pleural effusions. Musculoskeletal: No worrisome lytic or sclerotic osseous abnormality. CT ABDOMEN PELVIS FINDINGS Hepatobiliary: Scattered tiny hypodensities in the liver parenchyma are too small to characterize but are statistically most likely benign. No followup imaging is recommended. High attenuation material in the lumen of the gallbladder is compatible with vicarious excretion of intravenous contrast material. No intrahepatic or extrahepatic biliary dilation. Pancreas: No focal mass lesion. No dilatation of the main duct. No intraparenchymal cyst. No peripancreatic edema. Spleen: No splenomegaly. No suspicious focal mass lesion. Adrenals/Urinary Tract: No adrenal nodule or mass. Scattered punctate calcification noted in the parenchyma of the right kidney. Cortical and central sinus cysts again noted left kidney. No evidence for hydroureter. The urinary bladder appears normal for the degree of distention. Stomach/Bowel: Stomach is decompressed spine in G-tube. NG tube tip is in the distal stomach. Duodenum is normally positioned as is the ligament of Treitz. Proximal jejunal loops are nondilated. Small bowel loops remain dilated in the abdomen and pelvis measuring up to 5.4 cm diameter. Proximal and distal small bowel is un opacified with some dilute contrast identified in central dilated small bowel of the abdomen and pelvis which includes the small bowel anastomosis. There is an abrupt transition zone of dilated opacified small bowel in the right pelvis with a classic beak appearance (see axial 99/2). Immediately adjacent to this is a abrupt transition of un opacified small bowel immediately posterior to the transition in the opacified bowel (also image 99/2). A third area of transition is identified in the central pelvis involving another opacified small bowel loop adjacent to the anastomosis (best seen  on coronal 44/4. Multiple adhesions and apparent mesenteric tethering or seen in the central pelvis involving these areas of transition. In area of apparent mesenteric compression is identified in the right pelvis on image 93/2 raising the question of internal hernia. There are completely decompressed small bowel loops in the right lower quadrant there appears to be a staple line in the right colon although ileocecal anatomy is not well demonstrated on this study. Colon is diffusely decompressed. No definite contrast material within the colonic lumen. Vascular/Lymphatic: There is mild atherosclerotic calcification of the abdominal aorta without aneurysm. There is no gastrohepatic or hepatoduodenal ligament lymphadenopathy. No retroperitoneal or mesenteric lymphadenopathy. No pelvic sidewall lymphadenopathy. Reproductive: There is no adnexal mass. Other: Scattered areas of interloop mesenteric fluid noted with small volume free fluid identified in the pelvis. Musculoskeletal: No worrisome lytic or sclerotic osseous abnormality. IMPRESSION: 1. Small bowel loops remain dilated in the abdomen and pelvis measuring up to 5.4 cm diameter. There is some diluted contrast in the dilated small bowel loops, but no discernible contrast in the colonic lumen. The degree of small-bowel dilatation appears stable to mildly progressive in the interval. There there are multiple relatively abrupt transition zones from dilated to nondilated small bowel in the central pelvis. Multiple adhesions and apparent mesenteric tethering or seen in the central pelvis involving these areas of transition. Appearance suggest mechanical obstruction due to multiple adhesions although an area of apparent mesenteric compression is identified in the right pelvis raising the question of internal hernia. Given the proximal jejunum and distal small bowel in the right lower quadrant are decompressed, closed loop obstruction also a consideration. No small bowel  wall thickening. No evidence for pneumatosis. Multiple areas of interloop mesenteric fluid evident. 2. Small bilateral pleural effusions with dependent collapse/consolidation in the right lower lobe. 3. Small airway impaction with tree-in-bud nodularity and patchy airspace disease in the posterior left upper lobe/lingula and dependent left lower lobe. Imaging features are compatible with an infectious/inflammatory etiology. 4. Bronchiectasis with volume loss and consolidative opacity in the right middle lobe is progressive in the long interval since the prior study. This probably reflects chronic atelectasis/scarring. 5. 7 mm posterior right upper lobe pulmonary nodule is new in the long interval since previous chest CT. Non-contrast chest CT at 6-12 months is recommended. If the nodule is stable at time of repeat CT, then future CT at 18-24 months (from today's scan) is considered optional for low-risk patients, but is recommended for high-risk patients. This recommendation follows the consensus statement: Guidelines for Management of Incidental Pulmonary Nodules Detected on CT Images: From the Fleischner Society 2017; Radiology 2017; 284:228-243. 6. Mild circumferential wall thickening in the distal esophagus just proximal to a moderate hiatal hernia. Esophagitis would be a consideration. 7.  Aortic Atherosclerosis (ICD10-I70.0). Electronically Signed   By: Camellia Candle M.D.   On: 10/05/2023 05:56   ECHOCARDIOGRAM COMPLETE Result Date: 10/04/2023    ECHOCARDIOGRAM REPORT   Patient Name:   Tracey [name] Laing Date of Exam: 10/04/2023 Medical Rec #:  [id number]       Height:       62.0 in Accession #:    [id number]      Weight:       110.0 lb Date of Birth:  10-31-1940       BSA:          1.483 m Patient Age:    82 years        BP:  93/79 mmHg Patient Gender: F               HR:           112 bpm. Exam Location:  Inpatient Procedure: 2D Echo (Both Spectral and Color Flow Doppler were utilized during             procedure). Indications:    Afib  History:        Patient has no prior history of Echocardiogram examinations.                 Signs/Symptoms:Dyspnea.  Sonographer:    Norleen Amour Referring Phys: [id number] SYLVESTER I OGBATA IMPRESSIONS  1. Left ventricular ejection fraction, by estimation, is 60 to 65%. Left ventricular ejection fraction by 2D MOD biplane is 60.0 %. The left ventricle has normal function. The left ventricle has no regional wall motion abnormalities. There is mild left ventricular hypertrophy. Left ventricular diastolic function could not be evaluated.  2. Right ventricular systolic function is normal. The right ventricular size is normal. There is normal pulmonary artery systolic pressure. The estimated right ventricular systolic pressure is 26.6 mmHg.  3. The mitral valve is grossly normal. No evidence of mitral valve regurgitation.  4. The aortic valve is tricuspid. Aortic valve regurgitation is not visualized. Aortic valve sclerosis is present, with no evidence of aortic valve stenosis.  5. The inferior vena cava is normal in size with greater than 50% respiratory variability, suggesting right atrial pressure of 3 mmHg. Comparison(s): No prior Echocardiogram. FINDINGS  Left Ventricle: Left ventricular ejection fraction, by estimation, is 60 to 65%. Left ventricular ejection fraction by 2D MOD biplane is 60.0 %. The left ventricle has normal function. The left ventricle has no regional wall motion abnormalities. The left ventricular internal cavity size was normal in size. There is mild left ventricular hypertrophy. Left ventricular diastolic function could not be evaluated due to atrial fibrillation. Left ventricular diastolic function could not be evaluated. Right Ventricle: The right ventricular size is normal. No increase in right ventricular wall thickness. Right ventricular systolic function is normal. There is normal pulmonary artery systolic pressure. The tricuspid regurgitant velocity is  2.43 m/s, and  with an assumed right atrial pressure of 3 mmHg, the estimated right ventricular systolic pressure is 26.6 mmHg. Left Atrium: Left atrial size was normal in size. Right Atrium: Right atrial size was normal in size. Pericardium: There is no evidence of pericardial effusion. Mitral Valve: The mitral valve is grossly normal. No evidence of mitral valve regurgitation. Tricuspid Valve: The tricuspid valve is grossly normal. Tricuspid valve regurgitation is mild. Aortic Valve: The aortic valve is tricuspid. Aortic valve regurgitation is not visualized. Aortic valve sclerosis is present, with no evidence of aortic valve stenosis. Pulmonic Valve: The pulmonic valve was normal in structure. Pulmonic valve regurgitation is not visualized. Aorta: The aortic root and ascending aorta are structurally normal, with no evidence of dilitation. Venous: The inferior vena cava is normal in size with greater than 50% respiratory variability, suggesting right atrial pressure of 3 mmHg. IAS/Shunts: The interatrial septum was not well visualized.  LEFT VENTRICLE PLAX 2D                        Biplane EF (MOD) LV PW:         1.05 cm         LV Biplane EF:   Left LV IVS:        1.05 cm  ventricular LVOT diam:     1.70 cm                          ejection LV SV:         29                               fraction by LV SV Index:   19                               2D MOD LVOT Area:     2.27 cm                         biplane is                                                 60.0 %.  LV Volumes (MOD)               Diastology LV vol d, MOD    23.2 ml       LV e' medial:    7.51 cm/s A2C:                           LV E/e' medial:  11.7 LV vol d, MOD    44.2 ml       LV e' lateral:   8.23 cm/s A4C:                           LV E/e' lateral: 10.7 LV vol s, MOD    12.8 ml A2C: LV vol s, MOD    12.4 ml A4C: LV SV MOD A2C:   10.4 ml LV SV MOD A4C:   44.2 ml LV SV MOD BP:    19.5 ml RIGHT VENTRICLE             IVC  RV Basal diam:  2.50 cm     IVC diam: 0.90 cm RV S prime:     16.13 cm/s TAPSE (M-mode): 1.4 cm LEFT ATRIUM             Index        RIGHT ATRIUM           Index LA Vol (A2C):   45.1 ml 30.42 ml/m  RA Area:     11.30 cm LA Vol (A4C):   29.1 ml 19.62 ml/m  RA Volume:   24.50 ml  16.52 ml/m LA Biplane Vol: 36.8 ml 24.82 ml/m  AORTIC VALVE LVOT Vmax:   91.53 cm/s LVOT Vmean:  67.300 cm/s LVOT VTI:    0.127 m  AORTA Ao Root diam: 2.90 cm Ao Asc diam:  3.00 cm MV E velocity: 87.70 cm/s  TRICUSPID VALVE                            TR Peak grad:   23.6 mmHg                            TR Vmax:  243.00 cm/s                             SHUNTS                            Systemic VTI:  0.13 m                            Systemic Diam: 1.70 cm Vinie Maxcy MD Electronically signed by Vinie Maxcy MD Signature Date/Time: 10/04/2023/3:54:43 PM    Final    DG CHEST PORT 1 VIEW Result Date: 10/04/2023 CLINICAL DATA:  NG tube placement. EXAM: PORTABLE CHEST 1 VIEW COMPARISON:  Chest x-ray and KUB 10/04/2023 FINDINGS: Patient is slightly rotated to the right. Interval placement of nasogastric tube with tip and side-port over the stomach in the left upper quadrant. Lungs are adequately inflated with stable minimal hazy bibasilar opacification likely atelectasis versus edema and less likely infection. Cardiomediastinal silhouette is normal. Dilated air-filled small bowel loop over the upper abdomen unchanged. Remainder of the exam is unchanged. IMPRESSION: 1. Interval placement of nasogastric tube with tip and side-port over the stomach in the left upper quadrant. 2. Stable minimal hazy bibasilar opacification likely atelectasis versus edema and less likely infection. Electronically Signed   By: Toribio Agreste M.D.   On: 10/04/2023 12:20   DG Chest Port 1 View Result Date: 10/04/2023 CLINICAL DATA:  NG tube placement. EXAM: PORTABLE CHEST 1 VIEW COMPARISON:  08/06/2023 FINDINGS: The NG tube is looped back on itself in the  hypopharynx and does not extend down the esophagus. No pulmonary edema or focal airspace consolidation. Minimal basilar atelectasis with no substantial pleural effusion. Cardiopericardial silhouette is at upper limits of normal for size. Telemetry leads overlie the chest. IMPRESSION: NG tube is looped back on itself in the hypopharynx and does not extend down the esophagus. Tube should be removed and replaced. Repeat imaging after repositioning recommended. These results will be called to the ordering clinician or representative by the Radiologist Assistant, and communication documented in the PACS or [employer]. Electronically Signed   By: Camellia Candle M.D.   On: 10/04/2023 11:18   DG Abd Portable 1V Result Date: 10/04/2023 CLINICAL DATA:  Small bowel obstruction. EXAM: PORTABLE ABDOMEN - 1 VIEW COMPARISON:  10/03/2023 FINDINGS: NG tube tip is positioned in the distal stomach. Diffuse gaseous small bowel dilatation persists without substantial change. Small bowel loops are dilated up to 5.9 cm in the mid abdomen. IMPRESSION: Persistent diffuse gaseous small bowel dilatation compatible with small bowel obstruction. Electronically Signed   By: Camellia Candle M.D.   On: 10/04/2023 08:06   DG Abd 1 View Result Date: 10/03/2023 CLINICAL DATA:  83 year old female with high-grade small bowel obstruction on CT Abdomen and Pelvis 2 days ago. Enteric tube in place. Oral contrast administered. EXAM: ABDOMEN - 1 VIEW COMPARISON:  10/02/2023 comments are body CT 10/01/2023. FINDINGS: AP view at 0419 hours. Stable enteric tube, side hole the level of the gastric body. Continued gas distended small bowel loops throughout the mid and lower abdomen. Smaller volume of excreted IV contrast in the urinary bladder. Proximal small bowel oral contrast was better demonstrated yesterday. The large bowel is present as seen on recent CT. But no large bowel contrast is identified. Gas pattern does not appear improved since  10/01/2023. Lung bases appear stable, negative. No  acute osseous abnormality identified. Thoracolumbar scoliosis. IMPRESSION: 1. Small bowel obstruction pattern with no improvement since the CT on 10/01/2023. Oral contrast identified in the large bowel. 2. Stable enteric tube, side hole at the mid stomach. Electronically Signed   By: VEAR Hurst M.D.   On: 10/03/2023 04:49   DG Abd Portable 1V-Small Bowel Obstruction Protocol-initial, 8 hr delay Result Date: 10/02/2023 CLINICAL DATA:  Small bowel obstruction EXAM: PORTABLE ABDOMEN - 1 VIEW COMPARISON:  10/01/2023 plain films and CT. FINDINGS: NG tube is in the stomach. Dilated small bowel loops noted in the left abdomen and pelvis compatible with small bowel obstruction. No organomegaly or free air. IMPRESSION: Small bowel obstruction pattern. NG tube in the distal stomach. Electronically Signed   By: Franky Crease M.D.   On: 10/02/2023 14:16   DG Abd Portable 1 View Result Date: 10/01/2023 CLINICAL DATA:  Check gastric catheter placement EXAM: PORTABLE ABDOMEN - 1 VIEW COMPARISON:  None Available. FINDINGS: Gastric catheter is noted within the distal stomach. Dilated loops of small bowel. IMPRESSION: Gastric catheter in the stomach. Electronically Signed   By: Oneil Devonshire M.D.   On: 10/01/2023 22:20   CT ABDOMEN PELVIS W CONTRAST Result Date: 10/01/2023 CLINICAL DATA:  Provided history: Bowel obstruction suspected Technologist notes state constipation for 3 days with poor oral intake. Vomiting. EXAM: CT ABDOMEN AND PELVIS WITH CONTRAST TECHNIQUE: Multidetector CT imaging of the abdomen and pelvis was performed using the standard protocol following bolus administration of intravenous contrast. RADIATION DOSE REDUCTION: This exam was performed according to the departmental dose-optimization program which includes automated exposure control, adjustment of the mA and/or kV according to patient size and/or use of iterative reconstruction technique. CONTRAST:  80mL  OMNIPAQUE  IOHEXOL  300 MG/ML  SOLN COMPARISON:  09/18/2013 FINDINGS: Lower chest: Chronic scarring in the right middle lobe and lingula. Lobe segmental atelectasis or scarring in the periphery of the left lower lobe. No pleural effusion. Circumferential wall thickening of the distal esophagus. Small hiatal hernia. Hepatobiliary: Scattered tiny hepatic hypodensities, too small to characterize but likely cysts. Gallbladder physiologically distended, no calcified stone. No biliary dilatation. Pancreas: Parenchymal atrophy. No ductal dilatation or inflammation. Spleen: Normal in size without focal abnormality. Adrenals/Urinary Tract: No adrenal nodule. No hydronephrosis or renal inflammation. Multiple bilateral renal cysts and low-density lesions too small to characterize. Homogeneous enhancement with symmetric excretion on delayed phase imaging. The urinary bladder is not well-defined on the current exam, may be completely decompressed Stomach/Bowel: Wall thickening of the distal esophagus. Small to moderate hiatal hernia. Fluid distention of the stomach as well as fluid within the hiatal hernia. Dilated fluid-filled small bowel. This includes marked small bowel distention in the pelvis up to 5.3 cm. Fecalization of small bowel contents with transition point in the pelvis to the right of midline, series 2, image 61 and series 10, image 82. Mild mesenteric edema. Small to moderate amount of free fluid. No bowel pneumatosis or free air. There are enteric sutures within the small bowel in the pelvis is well as right colon. Small volume of formed colonic stool. Vascular/Lymphatic: Aortic atherosclerosis. No aneurysm. Patent portal, splenic, and mesenteric veins. Calcified mesenteric lymph nodes in the right lower quadrant. No suspicious noncalcified adenopathy. Reproductive: Status post hysterectomy. No adnexal masses. Other: Small to moderate free fluid, lower abdomen pelvis predominant. No free air. No focal fluid  collection. 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 Lumbar scoliosis. There are no acute or suspicious osseous abnormalities. IMPRESSION: 1. High-grade  small bowel obstruction with transition point in the pelvis to the right of midline. Mild mesenteric edema and small to moderate free fluid. 2. Small to moderate hiatal hernia. Circumferential wall thickening of the distal esophagus, can be seen with reflux or esophagitis. 3. 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 Aortic Atherosclerosis (ICD10-I70.0). Electronically Signed   By: Andrea Gasman M.D.   On: 10/01/2023 20:57    Microbiology: Results for orders placed or performed during the hospital encounter of 10/01/23  Culture, blood (Routine X 2) w Reflex to ID Panel     Status: None   Collection Time: 10/04/23  1:10 PM   Specimen: BLOOD LEFT ARM  Result Value Ref Range Status   Specimen Description   Final    BLOOD LEFT ARM Performed at [facility], [address], [place], [place] [address]    Special Requests   Final    BOTTLES DRAWN AEROBIC ONLY Blood Culture results may not be optimal due to an inadequate volume of blood received in culture bottles Performed at [facility], [address], [place], [place] [address]    Culture   Final    NO GROWTH 5 DAYS Performed at [facility], [address], [place], [place] [address]    Report Status 10/09/2023 FINAL  Final  Culture, blood (Routine X 2) w Reflex to ID Panel     Status: None   Collection Time: 10/04/23  1:10 PM   Specimen: BLOOD LEFT HAND  Result Value Ref Range Status   Specimen Description   Final    BLOOD LEFT HAND Performed at [facility], [address] [place], [place] [address]    Special Requests   Final    BOTTLES DRAWN AEROBIC AND ANAEROBIC Blood Culture results may not be optimal due to an inadequate volume of blood received in  culture bottles Performed at [facility], [address] [place], [place] [address]    Culture   Final    NO GROWTH 5 DAYS Performed at [facility], [address] [place], [place] [address]    Report Status 10/09/2023 FINAL  Final  MRSA Next Gen by PCR, Nasal     Status: None   Collection Time: 10/04/23  2:48 PM   Specimen: Nasal Mucosa; Nasal Swab  Result Value Ref Range Status   MRSA by PCR Next Gen NOT DETECTED NOT DETECTED Final    Comment: (NOTE) The GeneXpert MRSA Assay (FDA approved for NASAL specimens only), is one component of a comprehensive MRSA colonization surveillance program. It is not intended to diagnose MRSA infection nor to guide or monitor treatment for MRSA infections. Test performance is not FDA approved in patients less than 27 years old. Performed at [facility], [address] [place], [place] [address]   Urine Culture (for pregnant, neutropenic or urologic patients or patients with an indwelling urinary catheter)     Status: None   Collection Time: 10/08/23  3:00 PM   Specimen: Urine, Clean Catch  Result Value Ref Range Status   Specimen Description   Final    URINE, CLEAN CATCH Performed at [facility], [address] [place], [place] [address]    Special Requests   Final    NONE Performed at [facility], [address] [place], [place] [address]    Culture   Final    NO GROWTH Performed  at [facility], [address], [place], [place] [address]    Report Status 10/09/2023 FINAL  Final  Culture, blood (Routine X 2) w Reflex to ID Panel     Status: None   Collection Time: 10/09/23  8:52 AM   Specimen: BLOOD LEFT ARM  Result Value Ref Range Status   Specimen Description   Final    BLOOD LEFT ARM Performed at [facility], [address], [place], [place] [address]    Special Requests   Final    BOTTLES DRAWN AEROBIC ONLY Blood Culture  results may not be optimal due to an inadequate volume of blood received in culture bottles Performed at [facility], [address], [place], [place] [address]    Culture   Final    NO GROWTH 5 DAYS Performed at [facility], [address], [place], [place] [address]    Report Status 10/14/2023 FINAL  Final  Culture, blood (Routine X 2) w Reflex to ID Panel     Status: None   Collection Time: 10/09/23  9:09 AM   Specimen: BLOOD LEFT ARM  Result Value Ref Range Status   Specimen Description   Final    BLOOD LEFT ARM Performed at [facility], [address] [place], [place] [address]    Special Requests   Final    BOTTLES DRAWN AEROBIC ONLY Blood Culture results may not be optimal due to an inadequate volume of blood received in culture bottles Performed at [facility], [address] [place], [place] [address]    Culture   Final    NO GROWTH 5 DAYS Performed at [facility], [address] [place], [place] [address]    Report Status 10/14/2023 FINAL  Final  Urine Culture (for pregnant, neutropenic or urologic patients or patients with an indwelling urinary catheter)     Status: Abnormal   Collection Time: 10/14/23 10:49 AM   Specimen: Urine, Clean Catch  Result Value Ref Range Status   Specimen Description   Final    URINE, CLEAN CATCH Performed at [facility], [address] [place], [place] [address]    Special Requests   Final    NONE Performed at [facility], [address] [place], [place] [address]    Culture (A)  Final    >=100,000 COLONIES/mL PROTEUS MIRABILIS Two isolates with different morphologies were identified as the same organism.The most resistant organism was reported. Performed at [facility], [address], [place], [place] [address]    Report Status 10/17/2023 FINAL  Final   Organism ID, Bacteria PROTEUS MIRABILIS (A)  Final       Susceptibility   Proteus mirabilis - MIC*    AMPICILLIN <=2 SENSITIVE Sensitive     CEFAZOLIN (URINE) Value in next row Sensitive      4 SENSITIVEThis is a modified FDA-approved test that has been validated and its performance characteristics determined by the reporting laboratory.  This laboratory is certified under the Clinical Laboratory Improvement Amendments CLIA as qualified to perform high complexity clinical laboratory testing.    CEFEPIME Value in next row Sensitive      4 SENSITIVEThis is a modified FDA-approved test that has been validated and its performance characteristics determined by the reporting laboratory.  This laboratory is certified under the Clinical Laboratory Improvement Amendments CLIA as qualified to perform high complexity clinical laboratory testing.  ERTAPENEM Value in next row Sensitive      4 SENSITIVEThis is a modified FDA-approved test that has been validated and its performance characteristics determined by the reporting laboratory.  This laboratory is certified under the Clinical Laboratory Improvement Amendments CLIA as qualified to perform high complexity clinical laboratory testing.    CEFTRIAXONE  Value in next row Sensitive      4 SENSITIVEThis is a modified FDA-approved test that has been validated and its performance characteristics determined by the reporting laboratory.  This laboratory is certified under the Clinical Laboratory Improvement Amendments CLIA as qualified to perform high complexity clinical laboratory testing.    CIPROFLOXACIN  Value in next row Sensitive      4 SENSITIVEThis is a modified FDA-approved test that has been validated and its performance characteristics determined by the reporting laboratory.  This laboratory is certified under the Clinical Laboratory Improvement Amendments CLIA as qualified to perform high complexity clinical laboratory testing.    GENTAMICIN Value in next row Sensitive      4 SENSITIVEThis is a modified  FDA-approved test that has been validated and its performance characteristics determined by the reporting laboratory.  This laboratory is certified under the Clinical Laboratory Improvement Amendments CLIA as qualified to perform high complexity clinical laboratory testing.    NITROFURANTOIN Value in next row Resistant      4 SENSITIVEThis is a modified FDA-approved test that has been validated and its performance characteristics determined by the reporting laboratory.  This laboratory is certified under the Clinical Laboratory Improvement Amendments CLIA as qualified to perform high complexity clinical laboratory testing.    TRIMETH/SULFA Value in next row Sensitive      4 SENSITIVEThis is a modified FDA-approved test that has been validated and its performance characteristics determined by the reporting laboratory.  This laboratory is certified under the Clinical Laboratory Improvement Amendments CLIA as qualified to perform high complexity clinical laboratory testing.    AMPICILLIN/SULBACTAM Value in next row Sensitive      4 SENSITIVEThis is a modified FDA-approved test that has been validated and its performance characteristics determined by the reporting laboratory.  This laboratory is certified under the Clinical Laboratory Improvement Amendments CLIA as qualified to perform high complexity clinical laboratory testing.    PIP/TAZO Value in next row Sensitive      <=4 SENSITIVEThis is a modified FDA-approved test that has been validated and its performance characteristics determined by the reporting laboratory.  This laboratory is certified under the Clinical Laboratory Improvement Amendments CLIA as qualified to perform high complexity clinical laboratory testing.    MEROPENEM Value in next row Sensitive      <=4 SENSITIVEThis is a modified FDA-approved test that has been validated and its performance characteristics determined by the reporting laboratory.  This laboratory is certified under the  Clinical Laboratory Improvement Amendments CLIA as qualified to perform high complexity clinical laboratory testing.    * >=100,000 COLONIES/mL PROTEUS MIRABILIS    Labs: CBC: Recent Labs  Lab 10/23/23 0106 10/24/23 0310 10/25/23 0312 10/26/23 0312 10/27/23 0247  WBC 9.1 8.6 6.5 6.8 6.8  NEUTROABS 6.1 5.6 3.9 4.2 4.7  HGB 8.2* 8.4* 8.5* 9.2* 8.8*  HCT 27.8* 28.0* 27.4* 29.9* 28.8*  MCV 104.1* 102.9* 103.4* 103.1* 103.6*  PLT 426* 404* 367 356 318   Basic Metabolic Panel: Recent Labs  Lab 10/22/23 0435 10/23/23 0106 10/25/23 0312  NA 138 134* 141  K 4.2 4.2 4.2  CL 112* 108 109  CO2 18* 17* 25  GLUCOSE  111* 117* 120*  BUN 38* 39* 29*  CREATININE 0.64 0.59 0.62  CALCIUM 10.0 10.1 10.4*  MG 2.2 2.1 2.5*  PHOS 2.5 2.9 3.2   Liver Function Tests: Recent Labs  Lab 10/23/23 0106  AST 17  ALT 11  ALKPHOS 93  BILITOT 0.2  PROT 5.8*  ALBUMIN  3.2*   CBG: Recent Labs  Lab 10/26/23 1136 10/26/23 1623 10/26/23 2136 10/27/23 0728 10/27/23 1146  GLUCAP 106* 125* 121* 100* 136*    Discharge time spent: greater than 30 minutes.  Signed: Toribio Door, MD [facility] 10/27/2023

## 2023-10-27 NOTE — NC FL2 (Addendum)
 [place]  MEDICAID [id number] LEVEL OF CARE FORM     IDENTIFICATION  Patient Name: Tracey [name] Birthdate: [birth year] Sex: female Admission Date (Current Location): 10/01/2023  [facility] and [place] Number:  Producer, television/film/video and Address:  [facility],  [address] [place], [place] [address]      Provider Number: [id number]  Attending Physician Name and Address:  Jadine Toribio SQUIBB, MD  Relative Name and Phone Number:  [name], [name]  [phone] [facility])    Current Level of Care: Hospital Recommended Level of Care: Skilled Nursing Facility Prior Approval Number:    Date Approved/Denied:   PASRR Number: pending  Discharge Plan: SNF    Current Diagnoses: Patient Active Problem List   Diagnosis Date Noted   Postoperative ileus (HCC) 10/26/2023   Pelvic abscess in female 10/26/2023   Demand ischemia (HCC) 10/26/2023   Bipolar disease, chronic (HCC) 10/26/2023   Bronchiectasis (HCC) 10/26/2023   Pulmonary nodule 10/26/2023   Acute postoperative anemia due to expected blood loss 10/09/2023   Hypernatremia 10/09/2023   Iron  deficiency anemia 10/09/2023   New onset a-fib (HCC) 10/08/2023   Hypotension 10/08/2023   Dementia without behavioral disturbance (HCC) 10/08/2023   Hypophosphatemia 10/08/2023   Left ear pain 10/08/2023   Protein-calorie malnutrition, severe 10/07/2023   Urinary frequency 09/19/2022   Gait abnormality 08/01/2022   Dry mouth 07/11/2022   Corn of foot 05/09/2022   Toe pain, left 05/09/2022   Abnormal finding on GI tract imaging 03/25/2022   Dysphagia 12/19/2021   Oropharyngeal candidiasis 10/10/2021   Varicose veins of left leg with edema 12/28/2020   Dermatitis 11/30/2020   Major neurocognitive disorder (HCC) 02/10/2020   Tinnitus of both ears 02/02/2020   Cervical dystonia 01/26/2020   CKD (chronic kidney disease) stage 3, GFR 30-59 ml/min (HCC) 01/26/2020   Headache 01/12/2020   Speech abnormality 01/12/2020   Edema  07/01/2019   Right knee pain 06/17/2019   Hypercalcemia 06/17/2019   Slow transit constipation 05/31/2019   Bradycardia 05/31/2019   History of shingles 04/22/2019   Upper back pain on right side 04/01/2019   DOE (dyspnea on exertion) 03/25/2018   Cough variant asthma 03/24/2018   Diplopia 10/16/2015   SBO (small bowel obstruction) (HCC) 09/18/2013   Glaucoma 09/18/2013   GERD (gastroesophageal reflux disease) 09/18/2013   HLD (hyperlipidemia) 02/21/2013   Essential (primary) hypertension 02/21/2013   Barrett esophagus 02/19/2013   Cardiac conduction disorder 03/30/2012   Difficulty hearing 08/20/2011   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 12/05/2010   Allergic sinusitis 09/14/2010   Anxiety state 12/22/2009   Bipolar I disorder, single manic episode, in full remission 12/01/2009   Deficiency, 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   History of colon polyps 10/24/2008   Arthritis, degenerative 10/24/2008    Orientation RESPIRATION BLADDER Height & Weight     Self, Place  Normal  Incontinent Weight: 111 lb 12.4 oz (50.7 kg) Height:  5' 2 (157.5 cm)  BEHAVIORAL SYMPTOMS/MOOD NEUROLOGICAL BOWEL NUTRITION STATUS      Incontinent Diet (soft)  AMBULATORY STATUS COMMUNICATION OF NEEDS Skin   Limited Assist Verbally Other (Comment) (see d/c summary)                       Personal Care Assistance Level of Assistance  Bathing, Dressing, Feeding Bathing Assistance: Limited assistance Feeding assistance: Independent Dressing Assistance: Limited assistance     Functional Limitations Info  Sight, Hearing, Speech Sight  Info: Impaired (glasses)   Speech Info: Adequate    SPECIAL CARE FACTORS FREQUENCY  PT (By licensed PT), OT (By licensed OT)     PT Frequency: 5 x a week OT Frequency: 5 x a week            Contractures Contractures Info: Not present    Additional Factors Info  Code Status, Allergies  Code Status Info: DNR Allergies Info: Lisinopril  Penicillins  Adhesive (Tape)  Atorvastatin  Dilaudid  (Hydromorphone  Hcl)           Current Medications (10/27/2023):  This is the current hospital active medication list Current Facility-Administered Medications  Medication Dose Route Frequency Provider Last Rate Last Admin   acetaminophen  (TYLENOL ) tablet 650 mg  650 mg Oral Q6H PRN Gonfa, Taye T, MD   650 mg at 10/25/23 2210   amiodarone  (PACERONE ) tablet 100 mg  100 mg Oral Daily Gonfa, Taye T, MD   100 mg at 10/26/23 1136   apixaban (ELIQUIS) tablet 2.5 mg  2.5 mg Oral BID Jadine Toribio SQUIBB, MD   2.5 mg at 10/26/23 2107   artificial tears ophthalmic solution 1 drop  1 drop Both Eyes Q4H PRN Gonfa, Taye T, MD   1 drop at 10/20/23 2100   Chlorhexidine  Gluconate Cloth 2 % PADS 6 each  6 each Topical QHS Gonfa, Taye T, MD   6 each at 10/27/23 0000   cycloSPORINE  (RESTASIS ) 0.05 % ophthalmic emulsion 1 drop  1 drop Both Eyes BID Gonfa, Taye T, MD   1 drop at 10/26/23 2110   feeding supplement (BOOST / RESOURCE BREEZE) liquid 1 Container  1 Container Oral TID WC Gonfa, Taye T, MD   1 Container at 10/26/23 1748   feeding supplement (ENSURE SURGERY) liquid 237 mL  237 mL Oral TID BM Johnson, Kelly R, PA-C   237 mL at 10/26/23 2108   insulin  aspart (novoLOG ) injection 0-9 Units  0-9 Units Subcutaneous TID WC Jadine Toribio SQUIBB, MD   1 Units at 10/26/23 1748   latanoprost  (XALATAN ) 0.005 % ophthalmic solution 1 drop  1 drop Both Eyes QHS Gonfa, Taye T, MD   1 drop at 10/26/23 2108   lidocaine  (LIDODERM ) 5 % 1 patch  1 patch Transdermal Q24H Gonfa, Taye T, MD   1 patch at 10/26/23 1138   lithium  carbonate capsule 150 mg  150 mg Oral BID WC Jadine Toribio SQUIBB, MD   150 mg at 10/26/23 1918   LORazepam  (ATIVAN ) injection 0.5 mg  0.5 mg Intravenous Q4H PRN Gonfa, Taye T, MD   0.5 mg at 10/25/23 2215   methocarbamol  (ROBAXIN ) injection 500 mg  500 mg Intravenous Q8H PRN Gonfa, Taye T, MD   500 mg at  10/25/23 2211   Muscle Rub CREA   Topical PRN Gonfa, Taye T, MD   1 Application at 10/15/23 9061   ondansetron  (ZOFRAN ) tablet 4 mg  4 mg Per Tube Q6H PRN Gonfa, Taye T, MD       Or   ondansetron  (ZOFRAN ) injection 4 mg  4 mg Intravenous Q6H PRN Gonfa, Taye T, MD   4 mg at 10/18/23 2043   Oral care mouth rinse  15 mL Mouth Rinse PRN Gonfa, Taye T, MD       pantoprazole  (PROTONIX ) injection 40 mg  40 mg Intravenous Q24H Jadine Toribio SQUIBB, MD   40 mg at 10/26/23 1140   sodium bicarbonate tablet 650 mg  650 mg Oral TID Kathrin Mignon DASEN, MD  650 mg at 10/26/23 2109   sodium chloride  flush (NS) 0.9 % injection 10-40 mL  10-40 mL Intracatheter Q12H Gonfa, Taye T, MD   10 mL at 10/26/23 2109   sodium chloride  flush (NS) 0.9 % injection 10-40 mL  10-40 mL Intracatheter PRN Gonfa, Taye T, MD       timolol  (TIMOPTIC ) 0.5 % ophthalmic solution 1 drop  1 drop Both Eyes Daily Gonfa, Taye T, MD   1 drop at 10/26/23 1139   topiramate  (TOPAMAX ) tablet 25 mg  25 mg Oral BID Jadine Toribio SQUIBB, MD   25 mg at 10/26/23 2109     Discharge Medications: Please see discharge summary for a list of discharge medications.  Relevant Imaging Results:  Relevant Lab Results:   Additional Information [id number]  Tawni HERO Mustaf Antonacci, LCSW

## 2023-10-27 NOTE — TOC Transition Note (Signed)
 Transition of Care [facility]) - Discharge Note   Patient Details  Name: Tracey [name] MRN: [id number] Date of Birth: 1940-02-16  [facility]) CM/SW Contact:  Tawni CHRISTELLA Eva, LCSW Phone Number: 10/27/2023, 2:57 PM   Clinical Narrative:     Pt's insurance shara was approved and PASRR was assigned [id number]. Pt to d/c to [facility] , Room [address], RN to call report to [phone]. CSW spoke with pt's son Omar, who agrees with d/c plan. PTAR called , ICM sign off.    Final next level of care: Skilled Nursing Facility Barriers to Discharge: Barriers Resolved   Patient Goals and CMS Choice Patient states their goals for this hospitalization and ongoing recovery are:: SNF to get stonger CMS Medicare.gov Compare Post Acute Care list provided to:: Patient Represenative (must comment) (Kirk(son)) Choice offered to / list presented to : Adult Children [place] ownership interest in [facility].provided to:: Adult Children    Discharge Placement PASRR number recieved: 10/27/23              Patient to be transferred to facility by: [facility] Name of family member notified: Jenniefer, Salak (Son)  [phone] [facility]) Patient and family notified of of transfer: 10/27/23  Discharge Plan and Services Additional resources added to the After Visit Summary for     Discharge Planning Services: CM Consult Post Acute Care Choice: Skilled Nursing Facility                               Social Drivers of Health (SDOH) Interventions SDOH Screenings   Food Insecurity: No Food Insecurity (10/02/2023)  Housing: Low Risk  (10/02/2023)  Transportation Needs: No Transportation Needs (10/02/2023)  Utilities: Not At Risk (10/02/2023)  Depression (PHQ2-9): Low Risk  (10/02/2023)  Social Connections: Moderately Integrated (10/02/2023)  Tobacco Use: Low Risk  (10/16/2023)     Readmission Risk Interventions     No data to display

## 2023-10-27 NOTE — Progress Notes (Signed)
 Mobility Specialist Progress Note:    10/27/23 1517  Mobility  Activity Ambulated with assistance  Level of Assistance Minimal assist, patient does 75% or more  Assistive Device Front wheel walker  Distance Ambulated (ft) 140 ft  Activity Response Tolerated well  Mobility Referral Yes  Mobility visit 1 Mobility  Mobility Specialist Start Time (ACUTE ONLY) 1430  Mobility Specialist Stop Time (ACUTE ONLY) 1441  Mobility Specialist Time Calculation (min) (ACUTE ONLY) 11 min   Pt was received in bed and agreed to mobility. Min A from sit to stand. Returned to chair with all needs met. Chair alarm on. Call bell in reach.  [employer] - Mobility Specialist

## 2023-10-27 NOTE — Progress Notes (Signed)
 Nutrition Follow-up  DOCUMENTATION CODES:   Severe malnutrition in context of chronic illness  INTERVENTION:  - Soft diet per Surgery.   - Automatic trays while patient remains confused. -Boost Breeze po TID with meals, each supplement provides 250 kcal and 9 grams of protein. - Ensure Surgery po TID, each supplement provides 330 kcal and 18 grams of protein. - Encourage intake at all meals and of supplements.  - Add oral multivitamin with minerals daily now that patient off TPN. - Monitor weight trends.  NUTRITION DIAGNOSIS:   Severe Malnutrition related to chronic illness as evidenced by severe fat depletion, severe muscle depletion, percent weight loss (11% in 6 months). *ongoing  GOAL:   Patient will meet greater than or equal to 90% of their needs *progressing  MONITOR:   PO intake, Supplement acceptance  REASON FOR ASSESSMENT:   NPO/Clear Liquid Diet (x5 days)    ASSESSMENT:   83 y.o. female with PMH significant for dementia, cognitive impairment, bipolar disorder, possible Parkinsons disease and h/o rection of GIST tumor who presented with complaints of abdominal pain nausea vomiting and a lot of abdominal distention. Admitted for small bowel obstruction.  9/17 Admit; NPO; NGT placed 9/23 s/p ex-lap with lysis of adhesions, repair of small bowel enterotomy  9/24 TPN initiated 9/26 TPN increased to goal 9/29 CT A/P showing persistent SBO; Surgery feels patient has post-op ileus 10/3 CLD 10/4 FLD 10/5 Calorie count ordered 10/8 Soft diet 10/9 TPN discontinued; calorie count initiated; later that afternoon patient began vomiting profusely so patient made NPO  10/10 Calorie count discontinued; TPN restarted 10/11 patient with multiple BM's and no further N/V; advanced to a FLD; TPN stopped  10/12 Soft diet  Patient remains confused.  Thankfully, patient had multiple bowel movements on 10/11 and has had no further nausea or vomiting since that time. TPN  discontinued 10/11 and patient advanced to a soft diet yesterday.  Per review of documented meals, patient consuming 10-100% of meals since diet re-advanced. She is also drinking Boost Breeze 2-3 times per day in addition to Ensure Surgery 2-3 times per day.   Will continue supplements to support oral intake. Patient now deemed medically stable for discharge by surgery, insurance auth pending for SNF placement.    Admission weight: 110# Current weight: 111# I&O's: - since 9/29   Medications reviewed: No BMP since 10/11   Labs reviewed: Na 134  Diet Order:   Diet Order             DIET SOFT Room service appropriate? Yes; Fluid consistency: Thin  Diet effective now                   EDUCATION NEEDS:  No education needs have been identified at this time  Skin:  Skin Assessment: Skin Integrity Issues: Skin Integrity Issues:: Incisions Incisions: Abdomen  Last BM:  10/12 - type 6  Height:  Ht Readings from Last 1 Encounters:  10/07/23 5' 2 (1.575 m)   Weight:  Wt Readings from Last 1 Encounters:  10/27/23 50.7 kg    BMI:  Body mass index is 20.44 kg/m.  Estimated Nutritional Needs:  Kcal:  1500-1700 kcals Protein:  75-90 grams Fluid:  >/= 1.5L    Trude Ned RD, LDN Contact via Secure Chat.

## 2023-10-27 NOTE — TOC Progression Note (Addendum)
 Transition of Care [facility]) - Progression Note    Patient Details  Name: Tracey [name] MRN: [id number] Date of Birth: [birth year]  Transition of Care [facility]) CM/SW Contact  Tawni CHRISTELLA Eva, LCSW Phone Number: 10/27/2023, 9:14 AM  Clinical Narrative:     Pt's insurance shara and [name] is pending for SNF placement. Care management to follow.    Expected Discharge Plan: Skilled Nursing Facility Barriers to Discharge: Continued Medical Work up               Expected Discharge Plan and Services   Discharge Planning Services: CM Consult Post Acute Care Choice: Skilled Nursing Facility Living arrangements for the past 2 months: Assisted Living Facility                                       Social Drivers of Health (SDOH) Interventions SDOH Screenings   Food Insecurity: No Food Insecurity (10/02/2023)  Housing: Low Risk  (10/02/2023)  Transportation Needs: No Transportation Needs (10/02/2023)  Utilities: Not At Risk (10/02/2023)  Depression (PHQ2-9): Low Risk  (10/02/2023)  Social Connections: Moderately Integrated (10/02/2023)  Tobacco Use: Low Risk  (10/16/2023)    Readmission Risk Interventions     No data to display

## 2023-10-27 NOTE — TOC PASRR Note (Signed)
 [facility] IP TOC PASRR NOTE  30 Day PASRR Note   Patient Details  Name: Tracey [name] Date of Birth: [birth year]   Transition of Care [facility]) CM/SW Contact:    Tawni CHRISTELLA Eva, LCSW Phone Number: 10/27/2023, 9:33 AM  To Whom It May Concern:  Please be advised that this patient will require a short-term nursing home stay - anticipated 30 days or less for rehabilitation and strengthening.   The plan is for return home.

## 2023-10-27 NOTE — Progress Notes (Signed)
 Patient woke up very confused and consistently thinking she needs to drive home before the storm.  If patient is going to hospice or a SNF today please, let her know closer to the time so, she doesn't worry.

## 2023-10-27 NOTE — Progress Notes (Signed)
 VAST consult. PICC line removal. HOB less than 45*. Pt held breath upon line removal. Pressure held for 5 min with pressure drsg applied, instructed to keep drsg CDI for 24 hrs. To remain in bed for 30 min. Notified RN line has been pulled. RN VU. Powell Bowler, RN VAST

## 2023-10-27 NOTE — Progress Notes (Signed)
 TOC Dementia Note   Patient Details  Name: Tracey [name] Date of Birth: [birth year] 10/27/2023, 9:30 AM   To Whom It May Concern:  Please be advised that the above-named patient has a primary diagnosis of dementia which supersedes any psychiatric diagnosis.   Transition of Care (TOC) CM/SW Contact: Tawni CHRISTELLA Eva, LCSW Phone Number: 10/27/2023, 9:30 AM

## 2023-10-27 NOTE — Progress Notes (Signed)
 20 Days Post-Op   Subjective/Chief Complaint: Tolerating PO although no eating much. Having multiple bowel movements.    Objective: Vital signs in last 24 hours: Temp:  [97.8 F (36.6 C)-98.7 F (37.1 C)] 98.3 F (36.8 C) (10/13 0509) Pulse Rate:  [60-63] 62 (10/13 0509) Resp:  [14-18] 18 (10/13 0509) BP: (116-128)/(59-68) 128/68 (10/13 0509) SpO2:  [98 %-100 %] 98 % (10/13 0509) Weight:  [50.7 kg] 50.7 kg (10/13 0351) Last BM Date : 10/26/23  Intake/Output from previous day: 10/12 0701 - 10/13 0700 In: 921 [P.O.:911; I.V.:10] Out: 1700 [Urine:1700] Intake/Output this shift: Total I/O In: 240 [P.O.:240] Out: -   General: Pleasant female who is laying in bed in NAD. HEENT: Head is normocephalic, atraumatic. Heart: HR normal. Lungs: Respiratory effort nonlabored. Abd: Soft, NT, ND. Incision c/d/i with steri strips. No rebound tenderness or guarding. Skin: Warm and dry.  Lab Results:  Recent Labs    10/26/23 0312 10/27/23 0247  WBC 6.8 6.8  HGB 9.2* 8.8*  HCT 29.9* 28.8*  PLT 356 318   BMET Recent Labs    10/25/23 0312  NA 141  K 4.2  CL 109  CO2 25  GLUCOSE 120*  BUN 29*  CREATININE 0.62  CALCIUM 10.4*    Studies/Results: No results found.   Anti-infectives: Anti-infectives (From admission, onward)    Start     Dose/Rate Route Frequency Ordered Stop   10/14/23 0930  ceFEPIme (MAXIPIME) 2 g in sodium chloride  0.9 % 100 mL IVPB        2 g 200 mL/hr over 30 Minutes Intravenous Every 12 hours 10/14/23 0820 10/24/23 1500   10/14/23 0900  metroNIDAZOLE (FLAGYL) IVPB 500 mg        500 mg 100 mL/hr over 60 Minutes Intravenous 2 times daily 10/14/23 0811 10/24/23 1610   10/07/23 0915  cefoTEtan  (CEFOTAN ) 2 g in sodium chloride  0.9 % 100 mL IVPB        2 g 200 mL/hr over 30 Minutes Intravenous On call to O.R. 10/07/23 0820 10/07/23 1938       Assessment/Plan: SBO POD 20 s/p dx lap converted to ex lap with LOA and repair of enterotomy, Dr. Dasie  9/23. - No indications for additional intervention from surgery at this time - Continue diet as tolerated - proceed with discharge planning per primary team.  No further surgical issues     FEN: Soft low fiber diet VTE: Heparin  infusion  ID: Cefepime/Metronidazole completed 10/9.     - Per TRH - Dementia  GERD Tremors  HTN  Anxiety  A fib RVR - on amio gtt, heparin  gtt  Anemia    LOS: 26 days    Tracey [name] 10/27/2023

## 2023-10-27 NOTE — Progress Notes (Signed)
 Patient received discharge orders to go to [facility] facility. RN called report to [facility] facility. [facility] was called. Discharge paperwork/instructions packet ready. Just waiting for [facility] to pick up patient to take patient to [facility] facility.

## 2023-10-27 NOTE — Plan of Care (Signed)
 Discussed with patient plan of care for the shift, pain management and medications with some teach back displayed.  Patient is easily reoriented when waking up thinking she is going home.  Able to get the breeze ensure in the patient since her by mouth intake has declined.  Problem: Health Behavior/Discharge Planning: Goal: Ability to manage health-related needs will improve Outcome: Not Progressing   Problem: Education: Goal: Knowledge of General Education information will improve Description: Including pain rating scale, medication(s)/side effects and non-pharmacologic comfort measures Outcome: Not Progressing

## 2023-10-28 ENCOUNTER — Non-Acute Institutional Stay (SKILLED_NURSING_FACILITY): Payer: Self-pay | Admitting: Nurse Practitioner

## 2023-10-28 ENCOUNTER — Encounter: Payer: Self-pay | Admitting: Nurse Practitioner

## 2023-10-28 DIAGNOSIS — F0390 Unspecified dementia without behavioral disturbance: Secondary | ICD-10-CM

## 2023-10-28 DIAGNOSIS — E785 Hyperlipidemia, unspecified: Secondary | ICD-10-CM

## 2023-10-28 DIAGNOSIS — F319 Bipolar disorder, unspecified: Secondary | ICD-10-CM

## 2023-10-28 DIAGNOSIS — D62 Acute posthemorrhagic anemia: Secondary | ICD-10-CM

## 2023-10-28 DIAGNOSIS — K219 Gastro-esophageal reflux disease without esophagitis: Secondary | ICD-10-CM | POA: Diagnosis not present

## 2023-10-28 DIAGNOSIS — I4891 Unspecified atrial fibrillation: Secondary | ICD-10-CM

## 2023-10-28 DIAGNOSIS — G243 Spasmodic torticollis: Secondary | ICD-10-CM

## 2023-10-28 DIAGNOSIS — R911 Solitary pulmonary nodule: Secondary | ICD-10-CM

## 2023-10-28 DIAGNOSIS — C49A3 Gastrointestinal stromal tumor of small intestine: Secondary | ICD-10-CM

## 2023-10-28 DIAGNOSIS — I10 Essential (primary) hypertension: Secondary | ICD-10-CM

## 2023-10-28 NOTE — Assessment & Plan Note (Signed)
 LDL 121 07/29/23, on diet, tolerated Pravastatin 

## 2023-10-28 NOTE — Assessment & Plan Note (Signed)
 Bipolar disorder, stable, on Lithium , GDR due to bradycardia, takes Alprazolam  too, TSH 1.15 10/05/23. Declined Dr Cleotilde started low dose of Depakote . Lithium  level 0.4 07/30/22

## 2023-10-28 NOTE — Progress Notes (Unsigned)
 Location:   [facility] Room Number: 60B Place of Service:  SNF (31) Provider: Larwance Duchess Armendarez NP  Tracey Gibbs X, NP  Patient Care Team: Tracey Reimers X, NP as PCP - General (Internal Medicine) Tracey Sieving, MD as Consulting Physician (Gynecologic Oncology) Tracey Arley NOVAK, MD as Consulting Physician (Oncology) Tracey Purchase, MD as Consulting Physician (Gastroenterology) Tracey Charleston, MD as Consulting Physician (Ophthalmology) Tracey Fairy POUR, MD as Referring Physician (Neurology) Tat, Tracey RAMAN, DO as Consulting Physician (Neurology)  Extended Emergency Contact Information Primary Emergency Contact: Tracey [name] Address: [address]          [place], [place] [address] [place] Home Phone: [phone] Mobile Phone: [phone] Relation: Son Secondary Emergency Contact: Tracey [name] Numbers Home Phone: [phone] Mobile Phone: [phone] Relation: Relative  Code Status: DNR Goals of care: Advanced Directive information    10/07/2023    9:51 AM  Advanced Directives  Does Patient Have a Medical Advance Directive? Yes  Type of Advance Directive Healthcare Power of Attorney  Does patient want to make changes to medical advance directive? No - Patient declined  Copy of Healthcare Power of Attorney in Chart? No - copy requested     Chief Complaint  Patient presents with  . Acute Visit    Medication review following hospital stay.    HPI:  Pt is a 83 y.o. female seen today for an acute visit for medication review following hospital stay  Hospitalized 10/01/2023 to 10/27/2023 for small bowel obstruction, status post laparoscopy, exploratory laparotomy with lysis of adhesions, repair of the small bowel enterotomy 10/07/2023.  Her hospital stay was complicated with with post operative ileus, postop anemia, pelvic abscess-completed antibiotics.  Post op anemia, received IV iron  in the hospital, Iron  49, Vit B12 931 10/20/23, Hgb 7.8  10/21/23  Pelvic abscess completed ABD in hospital, noted on CT 10/13/23, cleared by surgeon upon discharge.   New onset of A-fib, follow-up cardiology, on amiodarone , Eliquis  Pulmonary nodule, 7 mm posterior right upper lobe, follow-up with noncontrast chest CT 6 to 42-month Dry mouth syndrome, resolved after dentist recommend mouth wash             Persisted running nose, facial pressure, sore throat, underwent ENT evaluation              Esophageal candidiasis, recurrent thrush identified on swallow study, FEES showed diffused white secretion coating areas of white plaques throughout pharynx. The patient c/o sore in buccal sides of tongue and in her throat when swallows. Treated with Diflucan  in the past, underwent GI evaluation, EGD 03/25/22 normal findings. Last treated with Diflucan  300mg  x1 04/10/22, then followed 200mg  qd x1 wk, may repeat x1.                Edema, LLE negative DVT venous US , L>R. Medial thickened swelling area about her palm sized, mild warmth, redness, and indurated area for a few month. Varicose veins LLE not new.  Tinnitus, underwent eval of  ENT, suggested music background.              Hx of GIST of small intestine s/p resection. MiraLax , Psyllium              Dysphagia, GI evaluated, EGD 03/25/22 wnl, working with ST, thicken liquids.              Constipation, Metamucil, MiraLax ,  effective.              GERD, stable, on Omeprazole   HTN, blood pressure is controlled on Losartan , Bun/creat 29/0.62 10/25/23             Bipolar disorder, stable, on Lithium , GDR due to bradycardia, takes Alprazolam  too, TSH 1.15 10/05/23. Declined Dr Tracey started low dose of Depakote . Lithium  level 0.4 07/30/22             Bradycardia, improved from 40s to 50s. Hx of normalized HR after Tylenol  PM dc'd, GDR of Lithium  helped too. BNP 67.4 08/06/23             Hypercalcemia, f/u endocrinology, Ca 10.3 04/18/23, 10.4 10/25/23, 01/12/20<<10.6 08/06/23,  PTH 79 01/12/20. GDR of Lithium   may help            OA, takes  Tylenol , s/p R knee arthroplasty.             Cervical degenerative changes/dystonia, better, CT/MRI 12/2019 ruled out acute process, aches in neck R>L travels to the back of head, comes and goes, not disabling, f/u Neurosurgery. Failed Cymbalta , Gabapentin , Robaxin , Ultracet . s/p Botulinum Toxin inj by Neurology. ESR 9, CRP 2.7 10/09/20(ESR 6 07/30/22). The patient stated Alprazolam  and Tylenol  are kind of effective. Improved on Topamax , but associated with weight loss.  No Parkinson's disease, took her off Sinemet  per Neurology. Currently taking inj               Cognitive impairment: 12/2019 MRI brain showed chronic microvascular ischemic changes, no acute disease with mild cortical atrophy, TSH 1.56 07/29/23             Hyperlipidemia, LDL 121 07/29/23, on diet, tolerated Pravastatin     Past Medical History:  Diagnosis Date  . Anemia   . Anxiety   . Arthritis   . Cardiac conduction disorder 03/30/2012   Overview:  STORY: ETT 03/09/2012 Echo 03/07/2012 normal Dr Ladona, bradycardia felt due to glaucoma eye drops  . Cervical dystonia   . Diverticulosis of colon   . DOE (dyspnea on exertion) 03/25/2018  . Gastroesophageal cancer (HCC)   . GERD (gastroesophageal reflux disease)   . GIST (gastrointestinal stroma tumor), malignant, colon (HCC)   . Glaucoma   . Heart murmur   . History of colon polyps 10/24/2008  . Hypertension   . Major neurocognitive disorder due to Parkinson's disease, possible    Tremors possible parkinsons  . Manic disorder, single episode, in full remission 12/01/2009  . Sixth nerve palsy    Past Surgical History:  Procedure Laterality Date  . BILATERAL SALPINGOOPHORECTOMY  09/22/2007  . CATARACT EXTRACTION Bilateral   . ESOPHAGOGASTRODUODENOSCOPY (EGD) WITH PROPOFOL  N/A 03/25/2022   Procedure: ESOPHAGOGASTRODUODENOSCOPY (EGD) WITH PROPOFOL ;  Surgeon: Legrand Victory LITTIE DOUGLAS, MD;  Location: [facility];  Service: Gastroenterology;  Laterality:  N/A;  . Gastrointestinal Stroma Tumor,  Other  1960   GIST Surgery  . ILEOCECETOMY  09/22/2007  . LAPAROSCOPIC LYSIS OF ADHESIONS N/A 10/07/2023   Procedure: LYSIS, ADHESIONS, LAPAROSCOPIC;  Surgeon: Dasie Leonor LITTIE, MD;  Location: [facility];  Service: General;  Laterality: N/A;  . LAPAROSCOPY N/A 10/07/2023   Procedure: DIAGNOSTIC LAPAROSCOPY, EXPLORATORY LAPAROTOMY WITH LYSIS OF ADHESIONS, REPAIR OF SMALL BOWEL ENTEROTOMY;  Surgeon: Dasie Leonor LITTIE, MD;  Location: [facility];  Service: General;  Laterality: N/A;  . OVARIAN CYST REMOVAL Right 1967  . SMALL INTESTINE SURGERY  09/22/2007  . TOTAL KNEE ARTHROPLASTY Right 10/05/2019   Procedure: RIGHT TOTAL KNEE ARTHROPLASTY;  Surgeon: Addie Cordella Hamilton, MD;  Location: [facility] OR;  Service: Orthopedics;  Laterality: Right;  .  TOTAL VAGINAL HYSTERECTOMY  1986   Fibroids    Allergies  Allergen Reactions  . Lisinopril Cough  . Penicillins Itching and Other (See Comments)    50 years ago  . Adhesive [Tape] Itching  . Atorvastatin Itching  . Dilaudid  [Hydromorphone  Hcl] Itching    Allergies as of 10/28/2023       Reactions   Lisinopril Cough   Penicillins Itching, Other (See Comments)   50 years ago   Adhesive [tape] Itching   Atorvastatin Itching   Dilaudid  [hydromorphone  Hcl] Itching        Medication List        Accurate as of October 28, 2023 12:43 PM. If you have any questions, ask your nurse or doctor.          acetaminophen  325 MG tablet Commonly known as: TYLENOL  Take 650 mg by mouth every 4 (four) hours as needed (for pain).   ALPRAZolam  0.5 MG tablet Commonly known as: XANAX  Take 1 tablet (0.5 mg total) by mouth 2 (two) times daily as needed for anxiety.   amiodarone  100 MG tablet Commonly known as: PACERONE  Take 1 tablet (100 mg total) by mouth daily.   amoxicillin 500 MG tablet Commonly known as: AMOXIL Take 2,000 mg by mouth as directed. 1 hour prior to dental procedure per [facility] from [facility]    apixaban 2.5 MG Tabs tablet Commonly known as: ELIQUIS Take 1 tablet (2.5 mg total) by mouth 2 (two) times daily.   cycloSPORINE  0.05 % ophthalmic emulsion Commonly known as: RESTASIS  Place 1 drop into both eyes 2 (two) times daily.   lactose free nutrition Liqd Take 237 mLs by mouth See admin instructions. Drink 237 ml's by mouth in the morning and afternoon   latanoprost  0.005 % ophthalmic solution Commonly known as: XALATAN  Place 1 drop into both eyes at bedtime.   lidocaine  5 % Commonly known as: LIDODERM  Place 1 patch onto the skin daily. Remove & Discard patch within 12 hours or as directed by MD   lithium  carbonate 150 MG capsule TAKE ONE CAPSULE BY MOUTH TWICE DAILY WITH MEALS   losartan  50 MG tablet Commonly known as: COZAAR  TAKE ONE TABLET BY MOUTH ONCE DAILY   multivitamin with minerals Tabs tablet Take 1 tablet by mouth daily.   ondansetron  4 MG tablet Commonly known as: ZOFRAN  Take 4 mg by mouth every 6 (six) hours as needed for nausea.   polyethylene glycol 17 g packet Commonly known as: MIRALAX  / GLYCOLAX  Take 17 g by mouth every other day.   pravastatin  10 MG tablet Commonly known as: PRAVACHOL  Take 5 mg by mouth every evening.   Systane 0.4-0.3 % Soln Generic drug: Polyethyl Glycol-Propyl Glycol Place 1 drop into both eyes every 4 (four) hours as needed (for dryness).   Systane Nighttime Oint Place 1 application  into both eyes at bedtime.   Timolol  Maleate (Once-Daily) 0.5 % Soln Place 1 drop into both eyes daily.   topiramate  25 MG tablet Commonly known as: TOPAMAX  TAKE ONE TABLET BY MOUTH TWICE DAILY IN THE MORNING AND IN THE EVENING What changed: See the new instructions.        Review of Systems  Constitutional:  Negative for appetite change, fatigue and fever.  HENT:  Positive for hearing loss and rhinorrhea. Negative for congestion.   Eyes:  Negative for visual disturbance.       Glaucoma, diplopia R+L lateral peripheral visual  fields only.   Respiratory:  Positive for cough  and shortness of breath. Negative for wheezing.        Occasionally DOE, hacking cough  Cardiovascular:  Positive for leg swelling.  Gastrointestinal:  Negative for abdominal pain and constipation.  Genitourinary:  Positive for frequency. Negative for dysuria and urgency.       Couple of times at night, no difficulty of returning asleep.   Musculoskeletal:  Positive for arthralgias and gait problem.       Walker.   Skin:  Negative for color change.  Neurological:  Negative for tremors, speech difficulty, light-headedness and headaches.       Comes and goes nature of the neck/occipital pain. Stiff neck. Pain the patent right knee, s/p TKR. Tremor is better since Sinemet .   Psychiatric/Behavioral:  Positive for sleep disturbance. Negative for confusion. The patient is nervous/anxious.        Better mood, feels not as anxious or depressive mood as prior.     Immunization History  Administered Date(s) Administered  . Fluad Quad(high Dose 65+) 11/03/2017, 10/30/2020  . INFLUENZA, HIGH DOSE SEASONAL PF 11/02/2013, 11/02/2013, 11/03/2017, 10/26/2018, 10/27/2019, 11/13/2022  . Influenza Split 10/24/2008, 09/26/2009, 10/08/2011, 10/05/2012  . Influenza, Quadrivalent, Recombinant, Inj, Pf 10/11/2016  . Influenza,inj,Quad PF,6+ Mos 11/01/2014, 10/11/2015, 10/11/2016  . Influenza-Unspecified 10/24/2008, 09/26/2009, 10/08/2011, 10/05/2012, 11/02/2013, 10/11/2016, 11/05/2021  . Moderna Covid-19 Vaccine Bivalent Booster 30yrs & up 11/13/2022  . Moderna SARS-COV2 Booster Vaccination 06/13/2020  . Moderna Sars-Covid-2 Vaccination 01/18/2019, 02/15/2019, 11/23/2019  . Pfizer Covid-19 Vaccine Bivalent Booster 3yrs & up 11/15/2021  . Pneumococcal Conjugate-13 08/20/2011  . Pneumococcal Polysaccharide-23 01/14/2005, 06/29/2014  . Pneumococcal-Unspecified 01/14/2005  . Tdap 08/20/2011, 04/18/2023  . Zoster Recombinant(Shingrix) 11/09/2018, 12/01/2018,  03/01/2019  . Zoster, Live 10/08/2011   Pertinent  Health Maintenance Due  Topic Date Due  . Influenza Vaccine  08/15/2023  . DEXA SCAN  Completed  . Mammogram  Discontinued      01/23/2023    3:14 PM 04/18/2023    8:59 AM 04/18/2023    2:40 PM 07/31/2023    1:01 PM 10/02/2023    8:51 AM  Fall Risk  Falls in the past year? 0 0 0 1 1  Was there an injury with Fall? 0 0 0 0 0  Was there an injury with Fall? - Comments    hit right knee   Fall Risk Category Calculator 0 0 0 1 1  Patient at Risk for Falls Due to  No Fall Risks No Fall Risks No Fall Risks History of fall(s);Impaired balance/gait  Fall risk Follow up  Falls evaluation completed Falls evaluation completed Falls evaluation completed Falls evaluation completed   Functional Status Survey:    Vitals:   10/28/23 1215  BP: (!) 137/59  Pulse: (!) 58  Resp: 16  Temp: 97.7 F (36.5 C)  SpO2: 97%   There is no height or weight on file to calculate BMI. Physical Exam Vitals and nursing note reviewed.  Constitutional:      Appearance: Normal appearance.  HENT:     Head: Normocephalic and atraumatic.     Nose: Nose normal.     Mouth/Throat:     Mouth: Mucous membranes are moist.  Eyes:     Extraocular Movements: Extraocular movements intact.     Conjunctiva/sclera: Conjunctivae normal.     Pupils: Pupils are equal, round, and reactive to light.  Cardiovascular:     Rate and Rhythm: Normal rate and regular rhythm.     Heart sounds: Murmur heard.     Comments:  DP pulses present R+L. HR in 50s Pulmonary:     Effort: Pulmonary effort is normal.     Breath sounds: No rales.  Abdominal:     General: Bowel sounds are normal.     Palpations: Abdomen is soft.     Tenderness: There is no abdominal tenderness.  Musculoskeletal:     Cervical back: Normal range of motion and neck supple.     Right lower leg: Edema present.     Left lower leg: Edema present.     Comments: Chronic R knee pain is improved. S/p TKR right. Felt  lateral right thigh muscle is tight.  Stiff neck, comes and goes neck/occipital pain. Trace edema BLE. Arthritic joint change fingers.   Skin:    General: Skin is warm and dry.     Comments: Improved the medial lower left leg a palm sized thickened, slightly reddened area, mild discomfort when palpated. DP present left foot. Scattered varicose veins left leg 05/09/22 medical left MTJ callous, plantar aspect 3rd toe callous, the left 2nd toe pain after injury, but no bruise, deformity, swelling, redness, able to PROM w/o pain.    Neurological:     General: No focal deficit present.     Mental Status: She is alert and oriented to person, place, and time. Mental status is at baseline.     Motor: No weakness.     Coordination: Coordination abnormal.     Gait: Gait abnormal.     Comments: Resting tremor in fingers, near resolution.   Psychiatric:        Mood and Affect: Mood normal.        Behavior: Behavior normal.        Thought Content: Thought content normal.     Labs reviewed: Recent Labs    10/22/23 0435 10/23/23 0106 10/25/23 0312  NA 138 134* 141  K 4.2 4.2 4.2  CL 112* 108 109  CO2 18* 17* 25  GLUCOSE 111* 117* 120*  BUN 38* 39* 29*  CREATININE 0.64 0.59 0.62  CALCIUM 10.0 10.1 10.4*  MG 2.2 2.1 2.5*  PHOS 2.5 2.9 3.2   Recent Labs    10/19/23 0344 10/20/23 0308 10/23/23 0106  AST 14* 13* 17  ALT 10 9 11   ALKPHOS 118 100 93  BILITOT <0.2 0.2 0.2  PROT 5.3* 4.8* 5.8*  ALBUMIN  2.8* 2.6* 3.2*   Recent Labs    10/25/23 0312 10/26/23 0312 10/27/23 0247  WBC 6.5 6.8 6.8  NEUTROABS 3.9 4.2 4.7  HGB 8.5* 9.2* 8.8*  HCT 27.4* 29.9* 28.8*  MCV 103.4* 103.1* 103.6*  PLT 367 356 318   Lab Results  Component Value Date   TSH 1.150 10/05/2023   No results found for: HGBA1C Lab Results  Component Value Date   CHOL 212 (H) 07/29/2023   HDL 62 07/29/2023   LDLCALC 121 (H) 07/29/2023   TRIG 65 10/20/2023   CHOLHDL 3.4 07/29/2023    Significant Diagnostic  Results in last 30 days:  CT ABDOMEN PELVIS W CONTRAST Result Date: 10/24/2023 CLINICAL DATA:  Small bowel obstruction and postoperative ileus EXAM: CT ABDOMEN AND PELVIS WITH CONTRAST TECHNIQUE: Multidetector CT imaging of the abdomen and pelvis was performed using the standard protocol following bolus administration of intravenous contrast. RADIATION DOSE REDUCTION: This exam was performed according to the departmental dose-optimization program which includes automated exposure control, adjustment of the mA and/or kV according to patient size and/or use of iterative reconstruction technique. CONTRAST:  100mL OMNIPAQUE  IOHEXOL   300 MG/ML  SOLN COMPARISON:  CT abdomen and pelvis dated 10/16/2023 FINDINGS: Lower chest: No focal consolidation or pulmonary nodule in the lung bases. Decreased trace right pleural effusion. Resolution of left pleural effusion. Partially imaged heart size is normal. Hepatobiliary: Unchanged scattered subcentimeter hypodensities, too small to characterize. No intra or extrahepatic biliary ductal dilation. Normal gallbladder. Pancreas: No focal lesions or main ductal dilation. 8 mm hypodensity near the pancreatic head (2:25). No main pancreatic ductal dilation. Spleen: Normal in size without focal abnormality. Adrenals/Urinary Tract: No adrenal nodules. No suspicious renal mass, calculi or hydronephrosis. Left renal simple and minimally complicated cysts. Additional bilateral subcentimeter hypodensities, too small to characterize. Mildly distended urinary bladder contains intraluminal gas. Stomach/Bowel: Small hiatal hernia. Normal appearance of the stomach. No evidence of bowel wall thickening, distention, or inflammatory changes. Enteric contrast material is seen within distal small bowel loops and reaches the level of the rectum. Appendix is not discretely seen. Vascular/Lymphatic: Aortic atherosclerosis. No enlarged abdominal or pelvic lymph nodes. Reproductive: No adnexal masses.  Other: Pelvic fluid collection measures 2.7 [name] 0.7 cm (2:64), previously 6.4 [name] 1.9 cm. Previously noted irregular collections in the left hemiabdomen are no longer seen. No free air. Musculoskeletal: No acute or abnormal lytic or blastic osseous lesions. Multilevel degenerative changes of the partially imaged thoracic and lumbar spine. Postsurgical changes of the anterior abdominal wall. Increased size of lobulated densities along the surgical incision measuring up to 2.6 [name] 2.1 cm (2:55) at the inferior aspect. IMPRESSION: 1. No evidence of bowel obstruction. Enteric contrast material is seen within distal small bowel loops and reaches the level of the rectum. 2. Decreased size of pelvic fluid collection measuring 2.7 [name] 0.7 cm, previously 6.4 [name] 1.9 cm. Previously noted irregular collections in the left hemiabdomen are no longer seen. 3. Increased size of lobulated densities along the surgical incision measuring up to 2.6 [name] 2.1 cm at the inferior aspect, which may represent hematomas or seromas. 4. Decreased trace right pleural effusion. Resolution of left pleural effusion. 5. Mildly distended urinary bladder contains intraluminal gas, which may be related to recent instrumentation. 6.  Aortic Atherosclerosis (ICD10-I70.0). Electronically Signed   By: Limin  Xu M.D.   On: 10/24/2023 18:59   DG Abd Portable 1V Result Date: 10/23/2023 CLINICAL DATA:  Nausea and vomiting EXAM: PORTABLE ABDOMEN - 1 VIEW COMPARISON:  CT abdomen and pelvis 10/16/2023 FINDINGS: Scattered gas and stool in the colon. Residual contrast material in the right lower quadrant, likely in the cecum. No small or large bowel distention. No radiopaque stones. Degenerative changes in the lumbar spine and hips. Lumbar scoliosis convex towards the right. Surgical clips in the right pelvis. Skin staples along the midline consistent with recent surgery. No radiopaque stones. IMPRESSION: Nonobstructive bowel gas pattern.  Postoperative changes.  Electronically Signed   By: Elsie Gravely M.D.   On: 10/23/2023 20:13   CT ABDOMEN PELVIS W CONTRAST Result Date: 10/16/2023 CLINICAL DATA:  Abdominal pain, post-op. EXAM: CT ABDOMEN AND PELVIS WITH CONTRAST TECHNIQUE: Multidetector CT imaging of the abdomen and pelvis was performed using the standard protocol following bolus administration of intravenous contrast. RADIATION DOSE REDUCTION: This exam was performed according to the departmental dose-optimization program which includes automated exposure control, adjustment of the mA and/or kV according to patient size and/or use of iterative reconstruction technique. CONTRAST:  80mL OMNIPAQUE  IOHEXOL  300 MG/ML  SOLN COMPARISON:  CT scan abdomen and pelvis from 10/13/2023. FINDINGS: Lower chest: There is trace left pleural effusion, slightly decreased  since the prior study and small right pleural effusion, similar to the prior study. There are associated atelectatic changes in the visualized bilateral lung bases. Normal heart size. No pericardial effusion. Hepatobiliary: The liver is normal in size. Non-cirrhotic configuration. No suspicious mass. Redemonstration of at least 3 hypoattenuating subcentimeter sized lesions in the liver, which are too small to adequately characterize but favored to represent cysts. There is a 5 mm hyperattenuating focus in the right hepatic dome (series 2, image 9), incompletely characterized on the current exam but favored to represent a flash filling hemangioma. No intrahepatic or extrahepatic bile duct dilation. Vicarious excretion of contrast noted within the gallbladder. No calcified gallstones. Normal gallbladder wall thickness. No pericholecystic inflammatory changes. Pancreas: Unremarkable. No pancreatic ductal dilatation or surrounding inflammatory changes. Spleen: Within normal limits. No focal lesion. Adrenals/Urinary Tract: Adrenal glands are unremarkable. There are several bilateral hypoattenuating structures, which are  too small to adequately characterize. However, the largest structure in the left kidney interpolar region, anteriorly measuring up to 1.1 [name] 1.1 cm, can be characterized as a simple cyst. No nephroureterolithiasis or obstructive uropathy. Sinus cyst noted in the left kidney upper pole. Urinary bladder is decompressed secondary to Foley catheter. Stomach/Bowel: There is at least small sliding hiatal hernia noted. Enteric tube is noted with its tip in the proximal stomach body region. No disproportionate dilation of the small or large bowel loops. No evidence of abnormal bowel wall thickening or inflammatory changes. The appendix was not visualized; however there is no acute inflammatory process in the right lower quadrant. Vascular/Lymphatic: There is mild interval decrease in the mild irregular hyperattenuating walled collection in the dependent pelvis currently measuring 3.1 [name] 6.6 cm (series 4, image 64). This may represent postoperative seroma or developing abscess. Correlate clinically. There are also multiple additional areas of fat stranding and very small noncommunicating mild irregular hyperattenuating walled collections in the left midabdomen abutting the descending colon which also exhibit mild-to-moderate irregular wall thickening at this level. (Series 2, image 39 and series 4, image 49). This is nonspecific. Differential diagnosis includes postsurgical changes, small developing abscesses, tumor deposits, etc. No pneumoperitoneum. No abdominal or pelvic lymphadenopathy, by size criteria. No aneurysmal dilation of the major abdominal arteries. There are mild peripheral atherosclerotic vascular calcifications of the aorta and its major branches. Reproductive: The uterus is surgically absent. No large adnexal mass. Other: Midline surgical scar noted. No anterior abdominal wall abscess/collection or hematoma. Anterior skin staples noted. The soft tissues and abdominal wall are otherwise unremarkable.  Musculoskeletal: No suspicious osseous lesions. There are mild - moderate multilevel degenerative changes in the visualized spine. IMPRESSION: 1. Interval resolution of previously seen bowel obstruction. 2. There is mild interval decrease in the mild irregular hyperattenuating walled collection in the dependent pelvis currently measuring 3.1 [name] 6.6 cm. This may represent postoperative seroma or developing abscess. 3. There are also multiple additional areas of fat stranding and very small noncommunicating mild irregular hyperattenuating walled collections in the left midabdomen abutting the descending colon which also exhibit mild-to-moderate irregular wall thickening at this level. This is nonspecific. Differential diagnosis includes postsurgical changes, small developing abscesses, tumor deposits, etc. 4. Multiple other nonacute observations, as described above. Aortic Atherosclerosis (ICD10-I70.0). Electronically Signed   By: Ree Molt M.D.   On: 10/16/2023 14:45   CT ABDOMEN PELVIS W CONTRAST Result Date: 10/13/2023 EXAM: CT ABDOMEN AND PELVIS WITH CONTRAST 10/13/2023 12:06:33 PM TECHNIQUE: CT of the abdomen and pelvis was performed with the administration of 100 mL of  iohexol  (OMNIPAQUE ) 300 MG/ML solution. Multiplanar reformatted images are provided for review. Automated exposure control, iterative reconstruction, and/or weight-based adjustment of the mA/kV was utilized to reduce the radiation dose to as low as reasonably achievable. COMPARISON: 10/05/2023 CLINICAL HISTORY: Abdominal pain, post-op. Evaluate progress of high-grade small bowel obstruction with transition point in the pelvis to the right of the midline and mild mesenteric edema. FINDINGS: LOWER CHEST: Slight increase in small/moderate effusions right greater than left. Dependent atelectasis posteriorly in the lung bases. Moderate hiatal hernia is partially visualized. LIVER: Stable probable small hepatic cysts in right hepatic lobe. No new  lesion. GALLBLADDER AND BILE DUCTS: Gallbladder is physiologically distended. No biliary ductal dilatation. SPLEEN: No acute abnormality. PANCREAS: No acute abnormality. ADRENAL GLANDS: No acute abnormality. KIDNEYS, URETERS AND BLADDER: Multiple cortical lesions in kidneys, some of which can be characterized as simple cysts, largest 12 mm mid left kidney. Per consensus, no follow-up is needed for simple Bosniak type 1 and 2 renal cysts, unless the patient has a malignancy history or risk factors. No stones in the kidneys or ureters. No hydronephrosis. No perinephric or periureteral stranding. The urinary bladder is distended. GI AND BOWEL: Gastric tube extends into the stomach, which is partially distended by gas and contrast material. Multiple dilated proximal and mid small bowel loops with transition point in the right lower quadrant. Decompressed loops of distal small bowel. There is physiologic distention of the right colon. The descending and sigmoid segments of the colon are decompressed with some mild regional inflammatory/edematous changes, new since previous, and regional scattered peripheral enhancing small fluid collections. PERITONEUM AND RETROPERITONEUM: No ascites. No free air. Scattered peripheral enhancing small fluid collections are noted, particularly in the region of the descending and sigmoid colon. VASCULATURE: Aorta is normal in caliber. Mild scattered iliac calcified plaque without aneurysm. LYMPH NODES: No lymphadenopathy. REPRODUCTIVE ORGANS: No acute abnormality. BONES AND SOFT TISSUES: Midline skin staples below the umbilicus. Surgical clips in the right pelvis as before. Degenerative disc disease L5-S1. No acute osseous abnormality. No focal soft tissue abnormality. IMPRESSION: 1. Persistent small bowel obstruction with transition point in the right lower quadrant. 2. descending and sigmoid colon decompressed with new mild regional inflammatory/edematous changes and scattered peripheral  enhancing small fluid collections since prior, suggesting colitis. 3. Small to moderate bilateral pleural effusions, right greater than left, with dependent basilar atelectasis. Electronically signed by: Dayne Hassell MD 10/13/2023 04:05 PM EDT RP Workstation: [id number]   DG CHEST PORT 1 VIEW Result Date: 10/08/2023 EXAM: 1 VIEW(S) XRAY OF THE CHEST 10/08/2023 01:43:00 PM COMPARISON: 10/07/2023 CLINICAL HISTORY: Encounter for assessment of peripherally inserted central catheter (PICC) [id number]. Encounter for PICC line placement. FINDINGS: LINES, TUBES AND DEVICES: Right upper extremity PICC repositioned. The tip now overlies right atrium. Stable enteric tube. LUNGS AND PLEURA: Bilateral hazy opacities. Small bilateral pleural effusions with associated atelectasis. No pulmonary edema. No pneumothorax. HEART AND MEDIASTINUM: No acute abnormality of the cardiac and mediastinal silhouettes. BONES AND SOFT TISSUES: No acute osseous abnormality. IMPRESSION: 1. Right upper extremity PICC tip repositioned. The tip now overlies the right atrium. 2. Stable enteric tube. 3. Bilateral small pleural effusions with associated atelectasis. 4. Bilateral hazy pulmonary opacities likely reflect aggressive behavior edema Infection is not excluded. Electronically signed by: Lonni Necessary MD 10/08/2023 02:06 PM EDT RP Workstation: [id number]   DG Abd 1 View Result Date: 10/08/2023 CLINICAL DATA:  Nasogastric tube placement. EXAM: ABDOMEN - 1 VIEW COMPARISON:  10/07/2023 at 5:17 a.m. FINDINGS: Nasogastric tube has tip and  side-port over the stomach in the left upper quadrant. Previously seen dilated air-filled small bowel loops over the upper abdomen not present on the current exam. No free peritoneal air. Skin staples over the lower midline abdomen. Perihilar linear density likely atelectasis. Possible small amount left pleural fluid. Remainder of the exam is unchanged. IMPRESSION: 1. Nasogastric tube with tip and side-port  over the stomach in the left upper quadrant. 2. Previously seen dilated air-filled small bowel loops over the upper abdomen not present on the current exam. Electronically Signed   By: Toribio Agreste M.D.   On: 10/08/2023 12:50   DG Chest 1 View Result Date: 10/07/2023 CLINICAL DATA:  Status post PICC placement EXAM: CHEST  1 VIEW COMPARISON:  Chest [name]-ray 10/07/2023 FINDINGS: Right upper extremity distal PICC line is seen projecting cranially in the right neck. The distal tip is not included on the image. This is unchanged from prior. Enteric tube is partially visualized. The heart is mildly enlarged. There small bilateral pleural effusions and minimal bibasilar infiltrates. No pneumothorax or acute fracture. IMPRESSION: 1. Malpositioned right upper extremity distal PICC line is seen projecting cranially in the right neck. The distal tip is not included on the image. This is unchanged from prior. 2. Small bilateral pleural effusions and minimal bibasilar infiltrates. Electronically Signed   By: Greig Pique M.D.   On: 10/07/2023 19:26   DG CHEST PORT 1 VIEW Result Date: 10/07/2023 CLINICAL DATA:  PICC line placement EXAM: PORTABLE CHEST 1 VIEW COMPARISON:  10/04/2023 FINDINGS: Right PICC line courses superiorly into the right internal jugular vein and projects off the superior aspect of the image. Tip is not visualized. NG tube is in the stomach. Heart and mediastinal contours are within normal limits. Worsening bilateral lower lobe airspace disease and probable layering effusions. No acute bony abnormality. IMPRESSION: Right PICC line courses superiorly into the right internal jugular vein and projects off the superior aspect of the image. Layering bilateral effusions with worsening bilateral lower lobe airspace disease. Electronically Signed   By: Franky Crease M.D.   On: 10/07/2023 18:36   US  EKG SITE RITE Result Date: 10/07/2023 If Site Rite image not attached, placement could not be confirmed due to  current cardiac rhythm.  DG Abd 1 View Result Date: 10/07/2023 CLINICAL DATA:  Follow-up small bowel obstruction. EXAM: ABDOMEN - 1 VIEW COMPARISON:  10/04/2023 and abdomen and pelvis CT dated 10/05/2023. FINDINGS: Again demonstrated are multiple dilated small bowel loops. These currently measure up to 5.0 cm in maximum diameter, previously 5.9 cm on 10/04/2023. No significant colon gas. A nasogastric tube remains in place with its tip in the mid stomach and side hole in the proximal to mid stomach. Stable thoracolumbar scoliosis and degenerative changes. IMPRESSION: Persistent small bowel obstruction with mild improvement. Electronically Signed   By: Elspeth Bathe M.D.   On: 10/07/2023 09:59   CT CHEST ABDOMEN PELVIS WO CONTRAST Result Date: 10/05/2023 CLINICAL DATA:  Respiratory illness. Bowel obstruction. 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 EXAM: CT CHEST, ABDOMEN AND PELVIS WITHOUT CONTRAST TECHNIQUE: Multidetector CT imaging of the chest, abdomen and pelvis was performed following the standard protocol without IV contrast. RADIATION DOSE REDUCTION: This exam was performed according to the departmental dose-optimization program which includes automated exposure control, adjustment of the mA and/or kV according to patient size and/or use of iterative reconstruction technique. COMPARISON:  Abdomen pelvis CT 10/01/2023.  Chest CT 04/24/2012 FINDINGS: CT CHEST FINDINGS Cardiovascular: The heart  size is normal. No substantial pericardial effusion. Mild atherosclerotic calcification is noted in the wall of the thoracic aorta. Mediastinum/Nodes: No mediastinal lymphadenopathy. NG tube visualized in the esophagus. Mild circumferential wall thickening noted distal esophagus just proximal to a moderate hiatal hernia. No evidence for gross hilar lymphadenopathy although assessment is limited by the lack of intravenous contrast on the current study. The esophagus has normal  imaging features. Lungs/Pleura: 7 mm posterior right upper lobe nodule on 51/6 is new in the long interval since previous chest CT. Small airway impaction with tree-in-bud nodularity and patchy airspace disease noted in the posterior left upper lobe/lingula and dependent left lower lobe. Bronchiectasis with volume loss and consolidative opacity in the right middle lobe is progressive in the long interval since the prior study. There is some airway impaction and subtle clustered nodularity in the inferior right upper lobe. Patchy ill-defined nodular airspace opacity in the right lower lobe is associated with dependent collapse/consolidation. Small bilateral pleural effusions. Musculoskeletal: No worrisome lytic or sclerotic osseous abnormality. CT ABDOMEN PELVIS FINDINGS Hepatobiliary: Scattered tiny hypodensities in the liver parenchyma are too small to characterize but are statistically most likely benign. No followup imaging is recommended. High attenuation material in the lumen of the gallbladder is compatible with vicarious excretion of intravenous contrast material. No intrahepatic or extrahepatic biliary dilation. Pancreas: No focal mass lesion. No dilatation of the main duct. No intraparenchymal cyst. No peripancreatic edema. Spleen: No splenomegaly. No suspicious focal mass lesion. Adrenals/Urinary Tract: No adrenal nodule or mass. Scattered punctate calcification noted in the parenchyma of the right kidney. Cortical and central sinus cysts again noted left kidney. No evidence for hydroureter. The urinary bladder appears normal for the degree of distention. Stomach/Bowel: Stomach is decompressed spine in G-tube. NG tube tip is in the distal stomach. Duodenum is normally positioned as is the ligament of Treitz. Proximal jejunal loops are nondilated. Small bowel loops remain dilated in the abdomen and pelvis measuring up to 5.4 cm diameter. Proximal and distal small bowel is un opacified with some dilute  contrast identified in central dilated small bowel of the abdomen and pelvis which includes the small bowel anastomosis. There is an abrupt transition zone of dilated opacified small bowel in the right pelvis with a classic beak appearance (see axial 99/2). Immediately adjacent to this is a abrupt transition of un opacified small bowel immediately posterior to the transition in the opacified bowel (also image 99/2). A third area of transition is identified in the central pelvis involving another opacified small bowel loop adjacent to the anastomosis (best seen on coronal 44/4. Multiple adhesions and apparent mesenteric tethering or seen in the central pelvis involving these areas of transition. In area of apparent mesenteric compression is identified in the right pelvis on image 93/2 raising the question of internal hernia. There are completely decompressed small bowel loops in the right lower quadrant there appears to be a staple line in the right colon although ileocecal anatomy is not well demonstrated on this study. Colon is diffusely decompressed. No definite contrast material within the colonic lumen. Vascular/Lymphatic: There is mild atherosclerotic calcification of the abdominal aorta without aneurysm. There is no gastrohepatic or hepatoduodenal ligament lymphadenopathy. No retroperitoneal or mesenteric lymphadenopathy. No pelvic sidewall lymphadenopathy. Reproductive: There is no adnexal mass. Other: Scattered areas of interloop mesenteric fluid noted with small volume free fluid identified in the pelvis. Musculoskeletal: No worrisome lytic or sclerotic osseous abnormality. IMPRESSION: 1. Small bowel loops remain dilated in the abdomen and pelvis measuring  up to 5.4 cm diameter. There is some diluted contrast in the dilated small bowel loops, but no discernible contrast in the colonic lumen. The degree of small-bowel dilatation appears stable to mildly progressive in the interval. There there are multiple  relatively abrupt transition zones from dilated to nondilated small bowel in the central pelvis. Multiple adhesions and apparent mesenteric tethering or seen in the central pelvis involving these areas of transition. Appearance suggest mechanical obstruction due to multiple adhesions although an area of apparent mesenteric compression is identified in the right pelvis raising the question of internal hernia. Given the proximal jejunum and distal small bowel in the right lower quadrant are decompressed, closed loop obstruction also a consideration. No small bowel wall thickening. No evidence for pneumatosis. Multiple areas of interloop mesenteric fluid evident. 2. Small bilateral pleural effusions with dependent collapse/consolidation in the right lower lobe. 3. Small airway impaction with tree-in-bud nodularity and patchy airspace disease in the posterior left upper lobe/lingula and dependent left lower lobe. Imaging features are compatible with an infectious/inflammatory etiology. 4. Bronchiectasis with volume loss and consolidative opacity in the right middle lobe is progressive in the long interval since the prior study. This probably reflects chronic atelectasis/scarring. 5. 7 mm posterior right upper lobe pulmonary nodule is new in the long interval since previous chest CT. Non-contrast chest CT at 6-12 months is recommended. If the nodule is stable at time of repeat CT, then future CT at 18-24 months (from today's scan) is considered optional for low-risk patients, but is recommended for high-risk patients. This recommendation follows the consensus statement: Guidelines for Management of Incidental Pulmonary Nodules Detected on CT Images: From the Fleischner Society 2017; Radiology 2017; 284:228-243. 6. Mild circumferential wall thickening in the distal esophagus just proximal to a moderate hiatal hernia. Esophagitis would be a consideration. 7.  Aortic Atherosclerosis (ICD10-I70.0). Electronically Signed    By: Camellia Candle M.D.   On: 10/05/2023 05:56   ECHOCARDIOGRAM COMPLETE Result Date: 10/04/2023    ECHOCARDIOGRAM REPORT   Patient Name:   Tracey [name] Date of Exam: 10/04/2023 Medical Rec #:  [id number]       Height:       62.0 in Accession #:    [id number]      Weight:       110.0 lb Date of Birth:  [birth year]       BSA:          1.483 m Patient Age:    82 years        BP:           93/79 mmHg Patient Gender: F               HR:           112 bpm. Exam Location:  Inpatient Procedure: 2D Echo (Both Spectral and Color Flow Doppler were utilized during            procedure). Indications:    Afib  History:        Patient has no prior history of Echocardiogram examinations.                 Signs/Symptoms:Dyspnea.  Sonographer:    Norleen Amour Referring Phys: [id number] SYLVESTER I OGBATA IMPRESSIONS  1. Left ventricular ejection fraction, by estimation, is 60 to 65%. Left ventricular ejection fraction by 2D MOD biplane is 60.0 %. The left ventricle has normal function. The left ventricle has no regional wall motion abnormalities. There is mild left  ventricular hypertrophy. Left ventricular diastolic function could not be evaluated.  2. Right ventricular systolic function is normal. The right ventricular size is normal. There is normal pulmonary artery systolic pressure. The estimated right ventricular systolic pressure is 26.6 mmHg.  3. The mitral valve is grossly normal. No evidence of mitral valve regurgitation.  4. The aortic valve is tricuspid. Aortic valve regurgitation is not visualized. Aortic valve sclerosis is present, with no evidence of aortic valve stenosis.  5. The inferior vena cava is normal in size with greater than 50% respiratory variability, suggesting right atrial pressure of 3 mmHg. Comparison(s): No prior Echocardiogram. FINDINGS  Left Ventricle: Left ventricular ejection fraction, by estimation, is 60 to 65%. Left ventricular ejection fraction by 2D MOD biplane is 60.0 %. The left ventricle has  normal function. The left ventricle has no regional wall motion abnormalities. The left ventricular internal cavity size was normal in size. There is mild left ventricular hypertrophy. Left ventricular diastolic function could not be evaluated due to atrial fibrillation. Left ventricular diastolic function could not be evaluated. Right Ventricle: The right ventricular size is normal. No increase in right ventricular wall thickness. Right ventricular systolic function is normal. There is normal pulmonary artery systolic pressure. The tricuspid regurgitant velocity is 2.43 m/s, and  with an assumed right atrial pressure of 3 mmHg, the estimated right ventricular systolic pressure is 26.6 mmHg. Left Atrium: Left atrial size was normal in size. Right Atrium: Right atrial size was normal in size. Pericardium: There is no evidence of pericardial effusion. Mitral Valve: The mitral valve is grossly normal. No evidence of mitral valve regurgitation. Tricuspid Valve: The tricuspid valve is grossly normal. Tricuspid valve regurgitation is mild. Aortic Valve: The aortic valve is tricuspid. Aortic valve regurgitation is not visualized. Aortic valve sclerosis is present, with no evidence of aortic valve stenosis. Pulmonic Valve: The pulmonic valve was normal in structure. Pulmonic valve regurgitation is not visualized. Aorta: The aortic root and ascending aorta are structurally normal, with no evidence of dilitation. Venous: The inferior vena cava is normal in size with greater than 50% respiratory variability, suggesting right atrial pressure of 3 mmHg. IAS/Shunts: The interatrial septum was not well visualized.  LEFT VENTRICLE PLAX 2D                        Biplane EF (MOD) LV PW:         1.05 cm         LV Biplane EF:   Left LV IVS:        1.05 cm                          ventricular LVOT diam:     1.70 cm                          ejection LV SV:         29                               fraction by LV SV Index:   19                                2D MOD LVOT Area:     2.27 cm  biplane is                                                 60.0 %.  LV Volumes (MOD)               Diastology LV vol d, MOD    23.2 ml       LV e' medial:    7.51 cm/s A2C:                           LV E/e' medial:  11.7 LV vol d, MOD    44.2 ml       LV e' lateral:   8.23 cm/s A4C:                           LV E/e' lateral: 10.7 LV vol s, MOD    12.8 ml A2C: LV vol s, MOD    12.4 ml A4C: LV SV MOD A2C:   10.4 ml LV SV MOD A4C:   44.2 ml LV SV MOD BP:    19.5 ml RIGHT VENTRICLE             IVC RV Basal diam:  2.50 cm     IVC diam: 0.90 cm RV S prime:     16.13 cm/s TAPSE (M-mode): 1.4 cm LEFT ATRIUM             Index        RIGHT ATRIUM           Index LA Vol (A2C):   45.1 ml 30.42 ml/m  RA Area:     11.30 cm LA Vol (A4C):   29.1 ml 19.62 ml/m  RA Volume:   24.50 ml  16.52 ml/m LA Biplane Vol: 36.8 ml 24.82 ml/m  AORTIC VALVE LVOT Vmax:   91.53 cm/s LVOT Vmean:  67.300 cm/s LVOT VTI:    0.127 m  AORTA Ao Root diam: 2.90 cm Ao Asc diam:  3.00 cm MV E velocity: 87.70 cm/s  TRICUSPID VALVE                            TR Peak grad:   23.6 mmHg                            TR Vmax:        243.00 cm/s                             SHUNTS                            Systemic VTI:  0.13 m                            Systemic Diam: 1.70 cm Vinie Maxcy MD Electronically signed by Vinie Maxcy MD Signature Date/Time: 10/04/2023/3:54:43 PM    Final    DG CHEST PORT 1 VIEW Result Date: 10/04/2023 CLINICAL DATA:  NG tube placement. EXAM: PORTABLE CHEST 1 VIEW COMPARISON:  Chest [name]-ray and KUB 10/04/2023 FINDINGS: Patient is  slightly rotated to the right. Interval placement of nasogastric tube with tip and side-port over the stomach in the left upper quadrant. Lungs are adequately inflated with stable minimal hazy bibasilar opacification likely atelectasis versus edema and less likely infection. Cardiomediastinal silhouette is normal. Dilated air-filled  small bowel loop over the upper abdomen unchanged. Remainder of the exam is unchanged. IMPRESSION: 1. Interval placement of nasogastric tube with tip and side-port over the stomach in the left upper quadrant. 2. Stable minimal hazy bibasilar opacification likely atelectasis versus edema and less likely infection. Electronically Signed   By: Toribio Agreste M.D.   On: 10/04/2023 12:20   DG Chest Port 1 View Result Date: 10/04/2023 CLINICAL DATA:  NG tube placement. EXAM: PORTABLE CHEST 1 VIEW COMPARISON:  08/06/2023 FINDINGS: The NG tube is looped back on itself in the hypopharynx and does not extend down the esophagus. No pulmonary edema or focal airspace consolidation. Minimal basilar atelectasis with no substantial pleural effusion. Cardiopericardial silhouette is at upper limits of normal for size. Telemetry leads overlie the chest. IMPRESSION: NG tube is looped back on itself in the hypopharynx and does not extend down the esophagus. Tube should be removed and replaced. Repeat imaging after repositioning recommended. These results will be called to the ordering clinician or representative by the Radiologist Assistant, and communication documented in the PACS or [employer]. Electronically Signed   By: Camellia Candle M.D.   On: 10/04/2023 11:18   DG Abd Portable 1V Result Date: 10/04/2023 CLINICAL DATA:  Small bowel obstruction. EXAM: PORTABLE ABDOMEN - 1 VIEW COMPARISON:  10/03/2023 FINDINGS: NG tube tip is positioned in the distal stomach. Diffuse gaseous small bowel dilatation persists without substantial change. Small bowel loops are dilated up to 5.9 cm in the mid abdomen. IMPRESSION: Persistent diffuse gaseous small bowel dilatation compatible with small bowel obstruction. Electronically Signed   By: Camellia Candle M.D.   On: 10/04/2023 08:06   DG Abd 1 View Result Date: 10/03/2023 CLINICAL DATA:  83 year old female with high-grade small bowel obstruction on CT Abdomen and Pelvis 2 days ago.  Enteric tube in place. Oral contrast administered. EXAM: ABDOMEN - 1 VIEW COMPARISON:  10/02/2023 comments are body CT 10/01/2023. FINDINGS: AP view at 0419 hours. Stable enteric tube, side hole the level of the gastric body. Continued gas distended small bowel loops throughout the mid and lower abdomen. Smaller volume of excreted IV contrast in the urinary bladder. Proximal small bowel oral contrast was better demonstrated yesterday. The large bowel is present as seen on recent CT. But no large bowel contrast is identified. Gas pattern does not appear improved since 10/01/2023. Lung bases appear stable, negative. No acute osseous abnormality identified. Thoracolumbar scoliosis. IMPRESSION: 1. Small bowel obstruction pattern with no improvement since the CT on 10/01/2023. Oral contrast identified in the large bowel. 2. Stable enteric tube, side hole at the mid stomach. Electronically Signed   By: VEAR Hurst M.D.   On: 10/03/2023 04:49   DG Abd Portable 1V-Small Bowel Obstruction Protocol-initial, 8 hr delay Result Date: 10/02/2023 CLINICAL DATA:  Small bowel obstruction EXAM: PORTABLE ABDOMEN - 1 VIEW COMPARISON:  10/01/2023 plain films and CT. FINDINGS: NG tube is in the stomach. Dilated small bowel loops noted in the left abdomen and pelvis compatible with small bowel obstruction. No organomegaly or free air. IMPRESSION: Small bowel obstruction pattern. NG tube in the distal stomach. Electronically Signed   By: Franky Crease M.D.   On: 10/02/2023 14:16   DG Abd Portable  1 View Result Date: 10/01/2023 CLINICAL DATA:  Check gastric catheter placement EXAM: PORTABLE ABDOMEN - 1 VIEW COMPARISON:  None Available. FINDINGS: Gastric catheter is noted within the distal stomach. Dilated loops of small bowel. IMPRESSION: Gastric catheter in the stomach. Electronically Signed   By: Oneil Devonshire M.D.   On: 10/01/2023 22:20   CT ABDOMEN PELVIS W CONTRAST Result Date: 10/01/2023 CLINICAL DATA:  Provided history: Bowel  obstruction suspected Technologist notes state constipation for 3 days with poor oral intake. Vomiting. EXAM: CT ABDOMEN AND PELVIS WITH CONTRAST TECHNIQUE: Multidetector CT imaging of the abdomen and pelvis was performed using the standard protocol following bolus administration of intravenous contrast. RADIATION DOSE REDUCTION: This exam was performed according to the departmental dose-optimization program which includes automated exposure control, adjustment of the mA and/or kV according to patient size and/or use of iterative reconstruction technique. CONTRAST:  80mL OMNIPAQUE  IOHEXOL  300 MG/ML  SOLN COMPARISON:  09/18/2013 FINDINGS: Lower chest: Chronic scarring in the right middle lobe and lingula. Lobe segmental atelectasis or scarring in the periphery of the left lower lobe. No pleural effusion. Circumferential wall thickening of the distal esophagus. Small hiatal hernia. Hepatobiliary: Scattered tiny hepatic hypodensities, too small to characterize but likely cysts. Gallbladder physiologically distended, no calcified stone. No biliary dilatation. Pancreas: Parenchymal atrophy. No ductal dilatation or inflammation. Spleen: Normal in size without focal abnormality. Adrenals/Urinary Tract: No adrenal nodule. No hydronephrosis or renal inflammation. Multiple bilateral renal cysts and low-density lesions too small to characterize. Homogeneous enhancement with symmetric excretion on delayed phase imaging. The urinary bladder is not well-defined on the current exam, may be completely decompressed Stomach/Bowel: Wall thickening of the distal esophagus. Small to moderate hiatal hernia. Fluid distention of the stomach as well as fluid within the hiatal hernia. Dilated fluid-filled small bowel. This includes marked small bowel distention in the pelvis up to 5.3 cm. Fecalization of small bowel contents with transition point in the pelvis to the right of midline, series 2, image 61 and series 10, image 82. Mild  mesenteric edema. Small to moderate amount of free fluid. No bowel pneumatosis or free air. There are enteric sutures within the small bowel in the pelvis is well as right colon. Small volume of formed colonic stool. Vascular/Lymphatic: Aortic atherosclerosis. No aneurysm. Patent portal, splenic, and mesenteric veins. Calcified mesenteric lymph nodes in the right lower quadrant. No suspicious noncalcified adenopathy. Reproductive: Status post hysterectomy. No adnexal masses. Other: Small to moderate free fluid, lower abdomen pelvis predominant. No free air. No focal fluid collection. 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 Lumbar scoliosis. There are no acute or suspicious osseous abnormalities. IMPRESSION: 1. High-grade small bowel obstruction with transition point in the pelvis to the right of midline. Mild mesenteric edema and small to moderate free fluid. 2. Small to moderate hiatal hernia. Circumferential wall thickening of the distal esophagus, can be seen with reflux or esophagitis. 3. 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 Aortic Atherosclerosis (ICD10-I70.0). Electronically Signed   By: Andrea Gasman M.D.   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 MiraLax , Psyllium   GERD (gastroesophageal reflux  disease) stable, on Omeprazole   Essential (primary) hypertension blood pressure is controlled on Losartan , Bun/creat 29/0.62 10/25/23  Bipolar disease, chronic (HCC) Bipolar disorder, stable, on Lithium , GDR due to bradycardia,  takes Alprazolam  too, TSH 1.15 10/05/23. Declined Dr Tracey started low dose of Depakote . Lithium  level 0.4 07/30/22  Hypercalcemia Hypercalcemia, f/u endocrinology, Ca 10.3 04/18/23, 10.4 10/25/23, 01/12/20<<10.6 08/06/23,  PTH 79 01/12/20. GDR of Lithium  may help  Cervical dystonia OA, takes  Tylenol , s/p R knee arthroplasty.             Cervical degenerative changes/dystonia, better, CT/MRI 12/2019 ruled out acute process, aches in neck R>L travels to the back of head, comes and goes, not disabling, f/u Neurosurgery. Failed Cymbalta , Gabapentin , Robaxin , Ultracet . s/p Botulinum Toxin inj by Neurology. ESR 9, CRP 2.7 10/09/20(ESR 6 07/30/22). The patient stated Alprazolam  and Tylenol  are kind of effective. Improved on Topamax , but associated with weight loss.  No Parkinson's disease, took her off Sinemet  per Neurology. Currently taking inj    Major neurocognitive disorder (HCC) Cognitive impairment: 12/2019 MRI brain showed chronic microvascular ischemic changes, no acute disease with mild cortical atrophy, TSH 1.56 07/29/23  HLD (hyperlipidemia)  LDL 121 07/29/23, on diet, tolerated Pravastatin   Acute postoperative anemia due to expected blood loss Post op anemia, received IV iron  in the hospital, Iron  49, Vit B12 931 10/20/23, Hgb 7.8 10/21/23 Update CBC/differential, CMP/eGFR in 1 week    Family/ staff Communication: Plan of care reviewed with the patient and charge nurse  Labs/tests ordered: CBC/diff, CMP/eGFR one week

## 2023-10-28 NOTE — Assessment & Plan Note (Addendum)
 Post op anemia, received IV iron  in the hospital, Iron  49, Vit B12 931 10/20/23, Hgb 7.8 10/21/23 Update CBC/differential, CMP/eGFR in 1 week

## 2023-10-28 NOTE — Assessment & Plan Note (Signed)
 Cognitive impairment: 12/2019 MRI brain showed chronic microvascular ischemic changes, no acute disease with mild cortical atrophy, TSH 1.56 07/29/23

## 2023-10-28 NOTE — Assessment & Plan Note (Signed)
 blood pressure runs low, asymptomatic, may decrease Losartan , Bun/creat 29/0.62 10/25/23, monitor.

## 2023-10-28 NOTE — Assessment & Plan Note (Signed)
 Hypercalcemia, f/u endocrinology, Ca 10.3 04/18/23, 10.4 10/25/23, 01/12/20<<10.6 08/06/23,  PTH 79 01/12/20. GDR of Lithium  may help

## 2023-10-28 NOTE — Assessment & Plan Note (Signed)
 stable, on Omeprazole.

## 2023-10-28 NOTE — Assessment & Plan Note (Signed)
 Pulmonary nodule, 7 mm posterior right upper lobe, follow-up with noncontrast chest CT 6 to 41-month

## 2023-10-28 NOTE — Assessment & Plan Note (Signed)
 OA, takes  Tylenol , s/p R knee arthroplasty.             Cervical degenerative changes/dystonia, better, CT/MRI 12/2019 ruled out acute process, aches in neck R>L travels to the back of head, comes and goes, not disabling, f/u Neurosurgery. Failed Cymbalta , Gabapentin , Robaxin , Ultracet . s/p Botulinum Toxin inj by Neurology. ESR 9, CRP 2.7 10/09/20(ESR 6 07/30/22). The patient stated Alprazolam  and Tylenol  are kind of effective. Improved on Topamax , but associated with weight loss.  No Parkinson's disease, took her off Sinemet  per Neurology. Currently taking inj

## 2023-10-28 NOTE — Assessment & Plan Note (Signed)
 Heart rate is in control, ew onset of A-fib, follow-up cardiology, on amiodarone , Eliquis

## 2023-10-28 NOTE — Assessment & Plan Note (Signed)
Hx of GIST of small intestine s/p resection. MiraLax, Psyllium

## 2023-10-30 ENCOUNTER — Non-Acute Institutional Stay (SKILLED_NURSING_FACILITY): Payer: Self-pay | Admitting: Sports Medicine

## 2023-10-30 DIAGNOSIS — D649 Anemia, unspecified: Secondary | ICD-10-CM

## 2023-10-30 DIAGNOSIS — I4891 Unspecified atrial fibrillation: Secondary | ICD-10-CM

## 2023-10-30 DIAGNOSIS — F0390 Unspecified dementia without behavioral disturbance: Secondary | ICD-10-CM

## 2023-10-30 DIAGNOSIS — K56609 Unspecified intestinal obstruction, unspecified as to partial versus complete obstruction: Secondary | ICD-10-CM | POA: Diagnosis not present

## 2023-10-30 DIAGNOSIS — F304 Manic episode in full remission: Secondary | ICD-10-CM

## 2023-10-30 DIAGNOSIS — E78019 Familial hypercholesterolemia, unspecified: Secondary | ICD-10-CM

## 2023-10-30 NOTE — Progress Notes (Signed)
 Provider:  Dr. Jackalyn Blazing Location:  [facility] Place of Service:   skilled Nursing   PCP: Mast, Man X, NP Patient Care Team: Mast, Man X, NP as PCP - General (Internal Medicine) Devonna Sieving, MD as Consulting Physician (Gynecologic Oncology) Cloretta Arley NOVAK, MD as Consulting Physician (Oncology) Luis Purchase, MD as Consulting Physician (Gastroenterology) Octavia Charleston, MD as Consulting Physician (Ophthalmology) Cleotilde Fairy POUR, MD as Referring Physician (Neurology) Tat, Asberry RAMAN, DO as Consulting Physician (Neurology)  Extended Emergency Contact Information Primary Emergency Contact: Corlis Sieving Mulders Address: [address]          [place], [place] [address] [place] Home Phone: [phone] Mobile Phone: [phone] Relation: Son Secondary Emergency Contact: Corlis Comer Numbers Home Phone: [phone] Mobile Phone: [phone] Relation: Relative  Goals of Care: Advanced Directive information    10/07/2023    9:51 AM  Advanced Directives  Does Patient Have a Medical Advance Directive? Yes  Type of Advance Directive Healthcare Power of Attorney  Does patient want to make changes to medical advance directive? No - Patient declined  Copy of Healthcare Power of Attorney in Chart? No - copy requested      No chief complaint on file.      History of Present Illness HPI: Patient is a 83 y.o. female  with PMH of A fib, GERD, Dementia, bipolar disorder is seen today for admission to [facility].   As per nursing staff pt is confused  She is not eating much  She was able to walk with therapy this morning Pt knows her name, oriented to place but not time Denies abdominal pain, nausea, vomiting , dysuria.  As per d/c summary  Hospital Course:    ''83 year old woman PMH including bipolar disorder, cognitive impairment, presenting with abdominal pain, admitted for SBO, failed conservative management and  underwent operative intervention 9/23, hospitalization complicated by pelvic abscess treated conservatively with antibiotics, and prolonged ileus.   Consultants General surgery Cardiology    Procedures/Events 9/17 admission for SBO 9/23 ex lap w/ LOA, repair of small bowel enterotomy 9/29 worsening leukocytosis, CT showed persistent SBO, small fluid collections, colitis.  Started on antibiotics.  Clinically felt to have ileus.   Small bowel obstruction Postoperative ileus  Pelvic abscess Failed conservative management, status post diagnostic and laparoscopic, lysis of adhesions, repair of small bowel enterotomy 9/23. Course complicated by prolonged ileus, intra-abdominal abscesses noted on CT 9/29 Completed antibiotics, per general surgery patient is cleared for discharge    Postoperative blood loss anemia Received IV iron  during hospitalization Hemoglobin stable.  ''   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, possible    Tremors possible parkinsons   Manic disorder, single episode, in full remission 12/01/2009   Sixth nerve palsy    Past Surgical History:  Procedure Laterality Date   BILATERAL SALPINGOOPHORECTOMY  09/22/2007   CATARACT EXTRACTION Bilateral  ESOPHAGOGASTRODUODENOSCOPY (EGD) WITH PROPOFOL  N/A 03/25/2022   Procedure: ESOPHAGOGASTRODUODENOSCOPY (EGD) WITH PROPOFOL ;  Surgeon: Legrand Victory LITTIE DOUGLAS, MD;  Location: [facility]  ENDOSCOPY;  Service: Gastroenterology;  Laterality: N/A;   Gastrointestinal Stroma Tumor,  Other  1960   GIST Surgery   ILEOCECETOMY  09/22/2007   LAPAROSCOPIC LYSIS OF ADHESIONS N/A 10/07/2023   Procedure: LYSIS, ADHESIONS, LAPAROSCOPIC;  Surgeon: Dasie Leonor LITTIE, MD;  Location: [facility] ORS;  Service: General;  Laterality: N/A;   LAPAROSCOPY N/A 10/07/2023   Procedure: DIAGNOSTIC LAPAROSCOPY, EXPLORATORY LAPAROTOMY WITH LYSIS OF ADHESIONS, REPAIR OF SMALL BOWEL ENTEROTOMY;  Surgeon: Dasie Leonor LITTIE, MD;  Location: [facility] ORS;  Service: General;  Laterality: N/A;   OVARIAN CYST REMOVAL Right 1967   SMALL INTESTINE SURGERY  09/22/2007   TOTAL KNEE ARTHROPLASTY Right 10/05/2019   Procedure: RIGHT TOTAL KNEE ARTHROPLASTY;  Surgeon: Addie Cordella Hamilton, MD;  Location: [facility] OR;  Service: Orthopedics;  Laterality: Right;   TOTAL VAGINAL HYSTERECTOMY  1986   Fibroids    reports that she has never smoked. She has never been exposed to tobacco smoke. She has never used smokeless tobacco. She reports that she does not drink alcohol  and does not use drugs. Social History   Socioeconomic History   Marital status: Widowed    Spouse name: Not on file   Number of children: 2   Years of education: Bachelors   Highest education level: Not on file  Occupational History   Occupation: retired    Comment: made wedding dressings  Tobacco Use   Smoking status: Never    Passive exposure: Never   Smokeless tobacco: Never  Vaping Use   Vaping status: Never Used  Substance and Sexual Activity   Alcohol  use: No   Drug use: No   Sexual activity: Not Currently    Birth control/protection: Post-menopausal  Other Topics Concern   Not on file  Social History Narrative   Lives at home alone.   Right-handed.   No caffeine use.      Diet: Eats anything, like fruits, vegetables, regular diet      Do you drink/ eat things with caffeine? No      Marital status:  Widowed                             What year were you  married ? 1965      Do you live in a house, apartment,assistred living, condo, trailer, etc.)? Apartment      Is it one or more stories?  One Story      How many persons live in your home ? 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  No                            Type & how often       ADVANCED DIRECTIVES (Please bring copies)      Do you have a living will? Yes      Do you have a DNR form? Yes                      If not, do you want to  discuss one?       Do you have signed POA?HPOA forms?  Yes               If so, please bring to your appointment      FUNCTIONAL STATUS- To be completed by Spouse / child / Staff       Do you have difficulty bathing or dressing yourself ? No   Do you have difficulty preparing food or eating ? No      Do you have difficulty managing your mediation ? No      Do you have difficulty managing your finances ? No      Do you have difficulty affording your medication ? No      Social Drivers of Corporate investment banker Strain: Not on file  Food Insecurity: No Food Insecurity (10/02/2023)   Hunger Vital Sign    Worried About Running Out of Food in the Last Year: Never true    Ran Out of Food in the Last Year: Never true  Transportation Needs: No Transportation Needs (10/02/2023)   PRAPARE - Administrator, Civil Service (Medical): No    Lack of Transportation (Non-Medical): No  Physical Activity: Not on file  Stress: Not on file  Social Connections: Moderately Integrated (10/02/2023)   Social Connection and Isolation Panel    Frequency of Communication with Friends and Family: Twice a week    Frequency of Social Gatherings with Friends and Family: Once a week    Attends Religious Services: 1 to 4 times per year    Active Member of [employer] or Organizations: Yes    Attends Occupational hygienist Meetings: 1 to 4 times per year    Marital Status: Widowed  Intimate Partner Violence: Not At Risk (10/02/2023)   Humiliation, Afraid, Rape, and Kick questionnaire    Fear of Current or Ex-Partner: No    Emotionally Abused: No    Physically Abused: No    Sexually Abused: No    Functional Status Survey:    Family History  Problem Relation Age of Onset   Congestive Heart Failure Mother    Dementia Mother    Heart disease Father    Diabetes Father    Lung cancer Son    Liver disease Neg Hx    Esophageal cancer Neg Hx    Colon cancer Neg Hx     Health Maintenance  Topic Date Due   Influenza Vaccine  08/15/2023   COVID-19 Vaccine (6 - 2025-26 season) 09/15/2023   Medicare Annual Wellness (AWV)  08/25/2024   DTaP/Tdap/Td (3 - Td or Tdap) 04/17/2033   Pneumococcal Vaccine: 50+ Years  Completed   DEXA SCAN  Completed   Zoster Vaccines- Shingrix  Completed   Meningococcal B Vaccine  Aged Out   Mammogram  Discontinued    Allergies  Allergen Reactions   Lisinopril Cough   Penicillins Itching and Other (See Comments)    50 years ago   Adhesive [Tape] Itching   Atorvastatin Itching   Dilaudid  [Hydromorphone  Hcl] Itching    Outpatient Encounter Medications as of 10/30/2023  Medication Sig   acetaminophen  (TYLENOL ) 325 MG tablet Take 650 mg by mouth every 4 (four) hours as needed (for pain).   ALPRAZolam  (XANAX ) 0.5 MG tablet Take 1 tablet (0.5 mg total) by mouth 2 (two) times daily as needed for anxiety.   amiodarone  (PACERONE ) 100 MG tablet Take 1 tablet (100 mg total) by mouth daily.  amoxicillin (AMOXIL) 500 MG tablet Take 2,000 mg by mouth as directed. 1 hour prior to dental procedure per [facility] from [facility]   apixaban (ELIQUIS) 2.5 MG TABS tablet Take 1 tablet (2.5 mg total) by mouth 2 (two) times daily.   cycloSPORINE  (RESTASIS ) 0.05 % ophthalmic emulsion Place 1 drop into both eyes 2 (two) times daily.   lactose free nutrition (BOOST) LIQD  Take 237 mLs by mouth See admin instructions. Drink 237 ml's by mouth in the morning and afternoon   latanoprost  (XALATAN ) 0.005 % ophthalmic solution Place 1 drop into both eyes at bedtime.   lidocaine  (LIDODERM ) 5 % Place 1 patch onto the skin daily. Remove & Discard patch within 12 hours or as directed by MD   lithium  carbonate 150 MG capsule TAKE ONE CAPSULE BY MOUTH TWICE DAILY WITH MEALS   losartan  (COZAAR ) 50 MG tablet TAKE ONE TABLET BY MOUTH ONCE DAILY   Multiple Vitamin (MULTIVITAMIN WITH MINERALS) TABS tablet Take 1 tablet by mouth daily.   ondansetron  (ZOFRAN ) 4 MG tablet Take 4 mg by mouth every 6 (six) hours as needed for nausea.   polyethylene glycol (MIRALAX  / GLYCOLAX ) 17 g packet Take 17 g by mouth every other day.   pravastatin  (PRAVACHOL ) 10 MG tablet Take 5 mg by mouth every evening.   SYSTANE 0.4-0.3 % SOLN Place 1 drop into both eyes every 4 (four) hours as needed (for dryness).   Timolol  Maleate, Once-Daily, 0.5 % SOLN Place 1 drop into both eyes daily.   topiramate  (TOPAMAX ) 25 MG tablet TAKE ONE TABLET BY MOUTH TWICE DAILY IN THE MORNING AND IN THE EVENING (Patient taking differently: Take 25 mg by mouth See admin instructions. Take 25 mg by mouth at 7 AM and 4 PM)   White Petrolatum-Mineral Oil (SYSTANE NIGHTTIME) OINT Place 1 application  into both eyes at bedtime.   No facility-administered encounter medications on file as of 10/30/2023.    Review of Systems  There were no vitals filed for this visit. There is no height or weight on file to calculate BMI. BP Readings from Last 3 Encounters:  10/30/23 (!) 95/59  10/27/23 (!) 143/60  10/01/23 122/62   Wt Readings from Last 3 Encounters:  10/27/23 111 lb 12.4 oz (50.7 kg)  10/01/23 115 lb 3.2 oz (52.3 kg)  08/26/23 112 lb 3.2 oz (50.9 kg)   Physical Exam Constitutional:      Appearance: Normal appearance.  HENT:     Head: Normocephalic and atraumatic.  Cardiovascular:     Rate and Rhythm: Normal rate and  regular rhythm.  Pulmonary:     Effort: Pulmonary effort is normal. No respiratory distress.     Breath sounds: Normal breath sounds. No wheezing.  Abdominal:     General: Bowel sounds are normal. There is no distension.     Tenderness: There is abdominal tenderness (mild generlaised tenderness). There is no guarding or rebound.     Comments:    Musculoskeletal:        General: No swelling.  Neurological:     Mental Status: She is alert. Mental status is at baseline.     Motor: No weakness.     Labs reviewed: Basic Metabolic Panel: Recent Labs    10/22/23 0435 10/23/23 0106 10/25/23 0312  NA 138 134* 141  K 4.2 4.2 4.2  CL 112* 108 109  CO2 18* 17* 25  GLUCOSE 111* 117* 120*  BUN 38* 39* 29*  CREATININE 0.64 0.59 0.62  CALCIUM 10.0 10.1 10.4*  MG 2.2 2.1 2.5*  PHOS 2.5 2.9 3.2   Liver Function Tests: Recent Labs    10/19/23 0344 10/20/23 0308 10/23/23 0106  AST 14* 13* 17  ALT 10 9 11   ALKPHOS 118 100 93  BILITOT <0.2 0.2 0.2  PROT 5.3* 4.8* 5.8*  ALBUMIN  2.8* 2.6* 3.2*   No results for input(s): LIPASE, AMYLASE in the last 8760 hours. No results for input(s): AMMONIA in the last 8760 hours. CBC: Recent Labs    10/25/23 0312 10/26/23 0312 10/27/23 0247  WBC 6.5 6.8 6.8  NEUTROABS 3.9 4.2 4.7  HGB 8.5* 9.2* 8.8*  HCT 27.4* 29.9* 28.8*  MCV 103.4* 103.1* 103.6*  PLT 367 356 318   Cardiac Enzymes: No results for input(s): CKTOTAL, CKMB, CKMBINDEX, TROPONINI in the last 8760 hours. BNP: Invalid input(s): POCBNP No results found for: HGBA1C Lab Results  Component Value Date   TSH 1.150 10/05/2023   Lab Results  Component Value Date   VITAMINB12 931 (H) 10/20/2023   Lab Results  Component Value Date   FOLATE 12.9 10/20/2023   Lab Results  Component Value Date   IRON  49 10/20/2023   TIBC 225 (L) 10/20/2023   FERRITIN 511 (H) 10/20/2023    Imaging and Procedures obtained prior to SNF admission: DG Abd Portable 1V-Small  Bowel Obstruction Protocol-initial, 8 hr delay Result Date: 10/02/2023 CLINICAL DATA:  Small bowel obstruction EXAM: PORTABLE ABDOMEN - 1 VIEW COMPARISON:  10/01/2023 plain films and CT. FINDINGS: NG tube is in the stomach. Dilated small bowel loops noted in the left abdomen and pelvis compatible with small bowel obstruction. No organomegaly or free air. IMPRESSION: Small bowel obstruction pattern. NG tube in the distal stomach. Electronically Signed   By: Franky Crease M.D.   On: 10/02/2023 14:16   DG Abd Portable 1 View Result Date: 10/01/2023 CLINICAL DATA:  Check gastric catheter placement EXAM: PORTABLE ABDOMEN - 1 VIEW COMPARISON:  None Available. FINDINGS: Gastric catheter is noted within the distal stomach. Dilated loops of small bowel. IMPRESSION: Gastric catheter in the stomach. Electronically Signed   By: Oneil Devonshire M.D.   On: 10/01/2023 22:20   CT ABDOMEN PELVIS W CONTRAST Result Date: 10/01/2023 CLINICAL DATA:  Provided history: Bowel obstruction suspected Technologist notes state constipation for 3 days with poor oral intake. Vomiting. EXAM: CT ABDOMEN AND PELVIS WITH CONTRAST TECHNIQUE: Multidetector CT imaging of the abdomen and pelvis was performed using the standard protocol following bolus administration of intravenous contrast. RADIATION DOSE REDUCTION: This exam was performed according to the departmental dose-optimization program which includes automated exposure control, adjustment of the mA and/or kV according to patient size and/or use of iterative reconstruction technique. CONTRAST:  80mL OMNIPAQUE  IOHEXOL  300 MG/ML  SOLN COMPARISON:  09/18/2013 FINDINGS: Lower chest: Chronic scarring in the right middle lobe and lingula. Lobe segmental atelectasis or scarring in the periphery of the left lower lobe. No pleural effusion. Circumferential wall thickening of the distal esophagus. Small hiatal hernia. Hepatobiliary: Scattered tiny hepatic hypodensities, too small to characterize but  likely cysts. Gallbladder physiologically distended, no calcified stone. No biliary dilatation. Pancreas: Parenchymal atrophy. No ductal dilatation or inflammation. Spleen: Normal in size without focal abnormality. Adrenals/Urinary Tract: No adrenal nodule. No hydronephrosis or renal inflammation. Multiple bilateral renal cysts and low-density lesions too small to characterize. Homogeneous enhancement with symmetric excretion on delayed phase imaging. The urinary bladder is not well-defined on the current exam, may be completely decompressed Stomach/Bowel: Wall thickening of the  distal esophagus. Small to moderate hiatal hernia. Fluid distention of the stomach as well as fluid within the hiatal hernia. Dilated fluid-filled small bowel. This includes marked small bowel distention in the pelvis up to 5.3 cm. Fecalization of small bowel contents with transition point in the pelvis to the right of midline, series 2, image 61 and series 10, image 82. Mild mesenteric edema. Small to moderate amount of free fluid. No bowel pneumatosis or free air. There are enteric sutures within the small bowel in the pelvis is well as right colon. Small volume of formed colonic stool. Vascular/Lymphatic: Aortic atherosclerosis. No aneurysm. Patent portal, splenic, and mesenteric veins. Calcified mesenteric lymph nodes in the right lower quadrant. No suspicious noncalcified adenopathy. Reproductive: Status post hysterectomy. No adnexal masses. Other: Small to moderate free fluid, lower abdomen pelvis predominant. No free air. No focal fluid collection. 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 Lumbar scoliosis. There are no acute or suspicious osseous abnormalities. IMPRESSION: 1. High-grade small bowel obstruction with transition point in the pelvis to the right of midline. Mild mesenteric edema and small to moderate free fluid. 2. Small to moderate hiatal hernia. Circumferential wall  thickening of the distal esophagus, can be seen with reflux or esophagitis. 3. 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 Aortic Atherosclerosis (ICD10-I70.0). Electronically Signed   By: Andrea Gasman M.D.   On: 10/01/2023 20:57    Assessment and Plan Assessment & Plan  Hospital discharge follow up Small bowel obstruction  Post operative ileus  As per nursing staff pt not eating much  Denies abdominal pain, nausea, vomiting Will check labs Will monitor    Afib  Cont with amiodarone , apixaban  Monitor for signs of bleeding Follow up with cardiology  Bipolar disorder Cont with lithium  and topamax   HLD  Cont with pravastatin    Anemia No signs of bleeding Will check cbc  Major neurocognitive disorder Monitor for delirium

## 2023-11-03 ENCOUNTER — Non-Acute Institutional Stay (SKILLED_NURSING_FACILITY): Payer: Self-pay | Admitting: Sports Medicine

## 2023-11-03 ENCOUNTER — Encounter: Payer: Self-pay | Admitting: Sports Medicine

## 2023-11-03 DIAGNOSIS — F0390 Unspecified dementia without behavioral disturbance: Secondary | ICD-10-CM | POA: Diagnosis not present

## 2023-11-03 DIAGNOSIS — B029 Zoster without complications: Secondary | ICD-10-CM | POA: Diagnosis not present

## 2023-11-03 DIAGNOSIS — K9189 Other postprocedural complications and disorders of digestive system: Secondary | ICD-10-CM

## 2023-11-03 DIAGNOSIS — F319 Bipolar disorder, unspecified: Secondary | ICD-10-CM | POA: Diagnosis not present

## 2023-11-03 DIAGNOSIS — I4891 Unspecified atrial fibrillation: Secondary | ICD-10-CM

## 2023-11-03 DIAGNOSIS — K567 Ileus, unspecified: Secondary | ICD-10-CM | POA: Diagnosis not present

## 2023-11-03 DIAGNOSIS — I10 Essential (primary) hypertension: Secondary | ICD-10-CM

## 2023-11-03 NOTE — Progress Notes (Signed)
 Provider:  Dr. Jackalyn Blazing Location:  [facility] Place of Service:   skilled care nursing rehab  PCP: Mast, Man X, NP Patient Care Team: Mast, Man X, NP as PCP - General (Internal Medicine) Devonna Sieving, MD as Consulting Physician (Gynecologic Oncology) Cloretta Arley NOVAK, MD as Consulting Physician (Oncology) Luis Purchase, MD as Consulting Physician (Gastroenterology) Octavia Charleston, MD as Consulting Physician (Ophthalmology) Cleotilde Fairy POUR, MD as Referring Physician (Neurology) Tat, Asberry RAMAN, DO as Consulting Physician (Neurology)  Extended Emergency Contact Information Primary Emergency Contact: Corlis Sieving Mulders Address: [address]          [place], [place] [address] [place] Home Phone: [phone] Mobile Phone: [phone] Relation: Son Secondary Emergency Contact: Corlis Comer Numbers Home Phone: [phone] Mobile Phone: [phone] Relation: Relative  Goals of Care: Advanced Directive information    10/07/2023    9:51 AM  Advanced Directives  Does Patient Have a Medical Advance Directive? Yes  Type of Advance Directive Healthcare Power of Attorney  Does patient want to make changes to medical advance directive? No - Patient declined  Copy of Healthcare Power of Attorney in Chart? No - copy requested       History of Present Illness   83 yr old F with PMH of Afib, GERD, Dementia, Bipolar disorder is seen today for acute visit for shingles and decreased po intake. As per nursing staff pt is confused, says that she wants to go home She had a fall on the weekend. She is not eating much  She had a vesicular rash on her left upper back  Pt denies pain or discomfort from the rash  Pt knows her name, she is oriented to place but not time She cannot remember what she ate for breakfast     Past Medical History:  Diagnosis Date   Anemia    Anxiety    Arthritis    Cardiac conduction disorder 03/30/2012    Overview:  STORY: ETT 03/09/2012 Echo 03/07/2012 normal Dr Ladona, bradycardia felt due to glaucoma eye drops   Cervical dystonia    Diverticulosis of colon    DOE (dyspnea on exertion) 03/25/2018   Gastroesophageal cancer (HCC)    GERD (gastroesophageal reflux disease)    GIST (gastrointestinal stroma tumor), malignant, colon (HCC)    Glaucoma    Heart murmur    History of colon polyps 10/24/2008   Hypertension    Major neurocognitive disorder due to Parkinson's disease, possible    Tremors possible parkinsons   Manic disorder, single episode, in full remission 12/01/2009   Sixth nerve palsy    Past Surgical History:  Procedure Laterality Date   BILATERAL SALPINGOOPHORECTOMY  09/22/2007   CATARACT EXTRACTION Bilateral    ESOPHAGOGASTRODUODENOSCOPY (EGD) WITH PROPOFOL  N/A 03/25/2022   Procedure: ESOPHAGOGASTRODUODENOSCOPY (EGD) WITH PROPOFOL ;  Surgeon: Legrand Victory LITTIE DOUGLAS, MD;  Location: [facility];  Service: Gastroenterology;  Laterality: N/A;   Gastrointestinal Stroma Tumor,  Other  1960   GIST Surgery   ILEOCECETOMY  09/22/2007   LAPAROSCOPIC LYSIS OF ADHESIONS N/A 10/07/2023   Procedure: LYSIS, ADHESIONS, LAPAROSCOPIC;  Surgeon: Dasie Leonor LITTIE, MD;  Location: [facility];  Service: General;  Laterality: N/A;   LAPAROSCOPY N/A 10/07/2023   Procedure: DIAGNOSTIC LAPAROSCOPY, EXPLORATORY LAPAROTOMY WITH LYSIS OF ADHESIONS, REPAIR OF SMALL BOWEL ENTEROTOMY;  Surgeon: Dasie Leonor LITTIE, MD;  Location: [facility];  Service: General;  Laterality: N/A;   OVARIAN CYST REMOVAL Right 1967   SMALL INTESTINE SURGERY  09/22/2007   TOTAL KNEE  ARTHROPLASTY Right 10/05/2019   Procedure: RIGHT TOTAL KNEE ARTHROPLASTY;  Surgeon: Addie Cordella Hamilton, MD;  Location: [facility] OR;  Service: Orthopedics;  Laterality: Right;   TOTAL VAGINAL HYSTERECTOMY  1986   Fibroids    reports that she has never smoked. She has never been exposed to tobacco smoke. She has never used smokeless tobacco. She reports that she does not  drink alcohol  and does not use drugs. Social History   Socioeconomic History   Marital status: Widowed    Spouse name: Not on file   Number of children: 2   Years of education: Bachelors   Highest education level: Not on file  Occupational History   Occupation: retired    Comment: made wedding dressings  Tobacco Use   Smoking status: Never    Passive exposure: Never   Smokeless tobacco: Never  Vaping Use   Vaping status: Never Used  Substance and Sexual Activity   Alcohol  use: No   Drug use: No   Sexual activity: Not Currently    Birth control/protection: Post-menopausal  Other Topics Concern   Not on file  Social History Narrative   Lives at home alone.   Right-handed.   No caffeine use.      Diet: Eats anything, like fruits, vegetables, regular diet      Do you drink/ eat things with caffeine? No      Marital status:  Widowed                             What year were you married ? 1965      Do you live in a house, apartment,assistred living, condo, trailer, etc.)? Apartment      Is it one or more stories?  One Story      How many persons live in your home ? 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  No                            Type & how often       ADVANCED DIRECTIVES (Please bring copies)      Do you have a living will? Yes      Do you have a DNR form? Yes                      If not, do you want to discuss one?       Do you have signed POA?HPOA forms?  Yes               If so, please bring to your appointment      FUNCTIONAL STATUS- To be completed by Spouse / child / Staff       Do you have difficulty bathing or dressing yourself ? No   Do you have difficulty preparing food or eating ? No      Do you have difficulty managing your mediation ? No      Do you have difficulty managing your  finances ? No      Do you have difficulty affording your medication ? No      Social Drivers of Health  Financial Resource Strain: Not on file  Food Insecurity: No Food Insecurity (10/02/2023)   Hunger Vital Sign    Worried About Running Out of Food in the Last Year: Never true    Ran Out of Food in the Last Year: Never true  Transportation Needs: No Transportation Needs (10/02/2023)   PRAPARE - Administrator, Civil Service (Medical): No    Lack of Transportation (Non-Medical): No  Physical Activity: Not on file  Stress: Not on file  Social Connections: Moderately Integrated (10/02/2023)   Social Connection and Isolation Panel    Frequency of Communication with Friends and Family: Twice a week    Frequency of Social Gatherings with Friends and Family: Once a week    Attends Religious Services: 1 to 4 times per year    Active Member of [employer] or Organizations: Yes    Attends Banker Meetings: 1 to 4 times per year    Marital Status: Widowed  Intimate Partner Violence: Not At Risk (10/02/2023)   Humiliation, Afraid, Rape, and Kick questionnaire    Fear of Current or Ex-Partner: No    Emotionally Abused: No    Physically Abused: No    Sexually Abused: No    Functional Status Survey:    Family History  Problem Relation Age of Onset   Congestive Heart Failure Mother    Dementia Mother    Heart disease Father    Diabetes Father    Lung cancer Son    Liver disease Neg Hx    Esophageal cancer Neg Hx    Colon cancer Neg Hx     Health Maintenance  Topic Date Due   Influenza Vaccine  08/15/2023   COVID-19 Vaccine (6 - 2025-26 season) 09/15/2023   Medicare Annual Wellness (AWV)  08/25/2024   DTaP/Tdap/Td (3 - Td or Tdap) 04/17/2033   Pneumococcal Vaccine: 50+ Years  Completed   DEXA SCAN  Completed   Zoster Vaccines- Shingrix  Completed   Meningococcal B Vaccine  Aged Out   Mammogram  Discontinued    Allergies  Allergen Reactions   Lisinopril  Cough   Penicillins Itching and Other (See Comments)    50 years ago   Adhesive [Tape] Itching   Atorvastatin Itching   Dilaudid  [Hydromorphone  Hcl] Itching    Outpatient Encounter Medications as of 11/03/2023  Medication Sig   acetaminophen  (TYLENOL ) 325 MG tablet Take 650 mg by mouth every 4 (four) hours as needed (for pain).   ALPRAZolam  (XANAX ) 0.5 MG tablet Take 1 tablet (0.5 mg total) by mouth 2 (two) times daily as needed for anxiety.   amiodarone  (PACERONE ) 100 MG tablet Take 1 tablet (100 mg total) by mouth daily.   amoxicillin (AMOXIL) 500 MG tablet Take 2,000 mg by mouth as directed. 1 hour prior to dental procedure per [facility] from [facility]   apixaban (ELIQUIS) 2.5 MG TABS tablet Take 1 tablet (2.5 mg total) by mouth 2 (two) times daily.   cycloSPORINE  (RESTASIS ) 0.05 % ophthalmic emulsion Place 1 drop into both eyes 2 (two) times daily.   lactose free nutrition (BOOST) LIQD Take 237 mLs by mouth See admin instructions. Drink 237 ml's by mouth in the morning and afternoon   latanoprost  (XALATAN ) 0.005 % ophthalmic solution Place 1 drop into both eyes at bedtime.   lidocaine  (LIDODERM ) 5 % Place 1 patch onto the skin daily. Remove & Discard patch within 12 hours or as directed by MD   lithium  carbonate 150 MG  capsule TAKE ONE CAPSULE BY MOUTH TWICE DAILY WITH MEALS   losartan  (COZAAR ) 50 MG tablet TAKE ONE TABLET BY MOUTH ONCE DAILY   Multiple Vitamin (MULTIVITAMIN WITH MINERALS) TABS tablet Take 1 tablet by mouth daily.   ondansetron  (ZOFRAN ) 4 MG tablet Take 4 mg by mouth every 6 (six) hours as needed for nausea.   polyethylene glycol (MIRALAX  / GLYCOLAX ) 17 g packet Take 17 g by mouth every other day.   pravastatin  (PRAVACHOL ) 10 MG tablet Take 5 mg by mouth every evening.   SYSTANE 0.4-0.3 % SOLN Place 1 drop into both eyes every 4 (four) hours as needed (for dryness).   Timolol  Maleate, Once-Daily, 0.5 % SOLN Place 1 drop into both eyes daily.   topiramate   (TOPAMAX ) 25 MG tablet TAKE ONE TABLET BY MOUTH TWICE DAILY IN THE MORNING AND IN THE EVENING (Patient taking differently: Take 25 mg by mouth See admin instructions. Take 25 mg by mouth at 7 AM and 4 PM)   White Petrolatum-Mineral Oil (SYSTANE NIGHTTIME) OINT Place 1 application  into both eyes at bedtime.   No facility-administered encounter medications on file as of 11/03/2023.    Review of Systems  Constitutional:  Negative for chills and fever.  Respiratory:  Negative for cough and shortness of breath.   Cardiovascular:  Negative for chest pain and leg swelling.  Gastrointestinal:  Negative for abdominal pain, blood in stool, constipation, diarrhea, nausea and vomiting.  Genitourinary:  Negative for dysuria.  Skin:  Positive for rash.  Psychiatric/Behavioral:  Positive for confusion.    Negative unless indicated in HPI.  There were no vitals filed for this visit. There is no height or weight on file to calculate BMI. BP Readings from Last 3 Encounters:  10/30/23 130/75  10/30/23 (!) 95/59  10/27/23 (!) 143/60   Wt Readings from Last 3 Encounters:  10/30/23 108 lb 1.6 oz (49 kg)  10/27/23 111 lb 12.4 oz (50.7 kg)  10/01/23 115 lb 3.2 oz (52.3 kg)   Physical Exam Constitutional:      Appearance: Normal appearance.  HENT:     Head: Normocephalic and atraumatic.  Cardiovascular:     Rate and Rhythm: Normal rate and regular rhythm.  Pulmonary:     Effort: Pulmonary effort is normal. No respiratory distress.     Breath sounds: Normal breath sounds. No wheezing.  Abdominal:     General: Bowel sounds are normal. There is no distension.     Tenderness: There is no abdominal tenderness. There is no guarding or rebound.     Comments:  Incision healed well, no gaping   Musculoskeletal:        General: No swelling.  Neurological:     Mental Status: She is alert. Mental status is at baseline.     Motor: No weakness.     Labs reviewed: Basic Metabolic Panel: Recent Labs     10/22/23 0435 10/23/23 0106 10/25/23 0312  NA 138 134* 141  K 4.2 4.2 4.2  CL 112* 108 109  CO2 18* 17* 25  GLUCOSE 111* 117* 120*  BUN 38* 39* 29*  CREATININE 0.64 0.59 0.62  CALCIUM 10.0 10.1 10.4*  MG 2.2 2.1 2.5*  PHOS 2.5 2.9 3.2   Liver Function Tests: Recent Labs    10/19/23 0344 10/20/23 0308 10/23/23 0106  AST 14* 13* 17  ALT 10 9 11   ALKPHOS 118 100 93  BILITOT <0.2 0.2 0.2  PROT 5.3* 4.8* 5.8*  ALBUMIN  2.8* 2.6* 3.2*  No results for input(s): LIPASE, AMYLASE in the last 8760 hours. No results for input(s): AMMONIA in the last 8760 hours. CBC: Recent Labs    10/25/23 0312 10/26/23 0312 10/27/23 0247  WBC 6.5 6.8 6.8  NEUTROABS 3.9 4.2 4.7  HGB 8.5* 9.2* 8.8*  HCT 27.4* 29.9* 28.8*  MCV 103.4* 103.1* 103.6*  PLT 367 356 318   Cardiac Enzymes: No results for input(s): CKTOTAL, CKMB, CKMBINDEX, TROPONINI in the last 8760 hours. BNP: Invalid input(s): POCBNP No results found for: HGBA1C Lab Results  Component Value Date   TSH 1.150 10/05/2023   Lab Results  Component Value Date   VITAMINB12 931 (H) 10/20/2023   Lab Results  Component Value Date   FOLATE 12.9 10/20/2023   Lab Results  Component Value Date   IRON  49 10/20/2023   TIBC 225 (L) 10/20/2023   FERRITIN 511 (H) 10/20/2023    Imaging and Procedures obtained prior to SNF admission: DG Abd Portable 1V-Small Bowel Obstruction Protocol-initial, 8 hr delay Result Date: 10/02/2023 CLINICAL DATA:  Small bowel obstruction EXAM: PORTABLE ABDOMEN - 1 VIEW COMPARISON:  10/01/2023 plain films and CT. FINDINGS: NG tube is in the stomach. Dilated small bowel loops noted in the left abdomen and pelvis compatible with small bowel obstruction. No organomegaly or free air. IMPRESSION: Small bowel obstruction pattern. NG tube in the distal stomach. Electronically Signed   By: Franky Crease M.D.   On: 10/02/2023 14:16   DG Abd Portable 1 View Result Date: 10/01/2023 CLINICAL DATA:   Check gastric catheter placement EXAM: PORTABLE ABDOMEN - 1 VIEW COMPARISON:  None Available. FINDINGS: Gastric catheter is noted within the distal stomach. Dilated loops of small bowel. IMPRESSION: Gastric catheter in the stomach. Electronically Signed   By: Oneil Devonshire M.D.   On: 10/01/2023 22:20   CT ABDOMEN PELVIS W CONTRAST Result Date: 10/01/2023 CLINICAL DATA:  Provided history: Bowel obstruction suspected Technologist notes state constipation for 3 days with poor oral intake. Vomiting. EXAM: CT ABDOMEN AND PELVIS WITH CONTRAST TECHNIQUE: Multidetector CT imaging of the abdomen and pelvis was performed using the standard protocol following bolus administration of intravenous contrast. RADIATION DOSE REDUCTION: This exam was performed according to the departmental dose-optimization program which includes automated exposure control, adjustment of the mA and/or kV according to patient size and/or use of iterative reconstruction technique. CONTRAST:  80mL OMNIPAQUE  IOHEXOL  300 MG/ML  SOLN COMPARISON:  09/18/2013 FINDINGS: Lower chest: Chronic scarring in the right middle lobe and lingula. Lobe segmental atelectasis or scarring in the periphery of the left lower lobe. No pleural effusion. Circumferential wall thickening of the distal esophagus. Small hiatal hernia. Hepatobiliary: Scattered tiny hepatic hypodensities, too small to characterize but likely cysts. Gallbladder physiologically distended, no calcified stone. No biliary dilatation. Pancreas: Parenchymal atrophy. No ductal dilatation or inflammation. Spleen: Normal in size without focal abnormality. Adrenals/Urinary Tract: No adrenal nodule. No hydronephrosis or renal inflammation. Multiple bilateral renal cysts and low-density lesions too small to characterize. Homogeneous enhancement with symmetric excretion on delayed phase imaging. The urinary bladder is not well-defined on the current exam, may be completely decompressed Stomach/Bowel: Wall  thickening of the distal esophagus. Small to moderate hiatal hernia. Fluid distention of the stomach as well as fluid within the hiatal hernia. Dilated fluid-filled small bowel. This includes marked small bowel distention in the pelvis up to 5.3 cm. Fecalization of small bowel contents with transition point in the pelvis to the right of midline, series 2, image 61 and series 10, image 82.  Mild mesenteric edema. Small to moderate amount of free fluid. No bowel pneumatosis or free air. There are enteric sutures within the small bowel in the pelvis is well as right colon. Small volume of formed colonic stool. Vascular/Lymphatic: Aortic atherosclerosis. No aneurysm. Patent portal, splenic, and mesenteric veins. Calcified mesenteric lymph nodes in the right lower quadrant. No suspicious noncalcified adenopathy. Reproductive: Status post hysterectomy. No adnexal masses. Other: Small to moderate free fluid, lower abdomen pelvis predominant. No free air. No focal fluid collection. 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 Lumbar scoliosis. There are no acute or suspicious osseous abnormalities. IMPRESSION: 1. High-grade small bowel obstruction with transition point in the pelvis to the right of midline. Mild mesenteric edema and small to moderate free fluid. 2. Small to moderate hiatal hernia. Circumferential wall thickening of the distal esophagus, can be seen with reflux or esophagitis. 3. 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 Aortic Atherosclerosis (ICD10-I70.0). Electronically Signed   By: Andrea Gasman M.D.   On: 10/01/2023 20:57    Assessment and Plan Assessment & Plan   1. Herpes zoster without complication (Primary) Vesicular rash left subscapular  Cont with valtrex Will check labs tomorrow  2. Major neurocognitive disorder (HCC) As per staff pt is confused and risk of falling Monitor for delirium    3.   Afib(HCC) No signs of bleeding Cont with amiodarone , apixiban  Follow up with cardiology Will check cmp, TSH   4. Postoperative ileus (HCC) Pt denies abdominal pain, nausea, vomiting Pt with decreased po intake  Will check labs Will start rameron  Follow up with surgery  5. Bipolar disease, chronic (HCC)  Cont with lithium , topiramate  , lithium    HTN  Cont with losartan 

## 2023-11-05 ENCOUNTER — Emergency Department (HOSPITAL_COMMUNITY)

## 2023-11-05 ENCOUNTER — Inpatient Hospital Stay (HOSPITAL_COMMUNITY)
Admission: EM | Admit: 2023-11-05 | Discharge: 2023-11-08 | DRG: 378 | Disposition: A | Discharge status: Skilled Nursing Facility | Source: Skilled Nursing Facility | Attending: Internal Medicine | Admitting: Internal Medicine

## 2023-11-05 DIAGNOSIS — K449 Diaphragmatic hernia without obstruction or gangrene: Secondary | ICD-10-CM | POA: Diagnosis not present

## 2023-11-05 DIAGNOSIS — Z7901 Long term (current) use of anticoagulants: Secondary | ICD-10-CM

## 2023-11-05 DIAGNOSIS — Z9842 Cataract extraction status, left eye: Secondary | ICD-10-CM

## 2023-11-05 DIAGNOSIS — Z91048 Other nonmedicinal substance allergy status: Secondary | ICD-10-CM

## 2023-11-05 DIAGNOSIS — K7689 Other specified diseases of liver: Secondary | ICD-10-CM | POA: Diagnosis not present

## 2023-11-05 DIAGNOSIS — Z85831 Personal history of malignant neoplasm of soft tissue: Secondary | ICD-10-CM

## 2023-11-05 DIAGNOSIS — Z888 Allergy status to other drugs, medicaments and biological substances status: Secondary | ICD-10-CM

## 2023-11-05 DIAGNOSIS — H409 Unspecified glaucoma: Secondary | ICD-10-CM | POA: Diagnosis present

## 2023-11-05 DIAGNOSIS — I480 Paroxysmal atrial fibrillation: Secondary | ICD-10-CM | POA: Diagnosis not present

## 2023-11-05 DIAGNOSIS — Z860100 Personal history of colon polyps, unspecified: Secondary | ICD-10-CM

## 2023-11-05 DIAGNOSIS — B029 Zoster without complications: Secondary | ICD-10-CM | POA: Diagnosis present

## 2023-11-05 DIAGNOSIS — N281 Cyst of kidney, acquired: Secondary | ICD-10-CM | POA: Diagnosis not present

## 2023-11-05 DIAGNOSIS — Z885 Allergy status to narcotic agent status: Secondary | ICD-10-CM

## 2023-11-05 DIAGNOSIS — R1110 Vomiting, unspecified: Secondary | ICD-10-CM | POA: Diagnosis not present

## 2023-11-05 DIAGNOSIS — Z862 Personal history of diseases of the blood and blood-forming organs and certain disorders involving the immune mechanism: Secondary | ICD-10-CM

## 2023-11-05 DIAGNOSIS — K2971 Gastritis, unspecified, with bleeding: Principal | ICD-10-CM | POA: Diagnosis present

## 2023-11-05 DIAGNOSIS — Z9841 Cataract extraction status, right eye: Secondary | ICD-10-CM

## 2023-11-05 DIAGNOSIS — E785 Hyperlipidemia, unspecified: Secondary | ICD-10-CM | POA: Diagnosis present

## 2023-11-05 DIAGNOSIS — R112 Nausea with vomiting, unspecified: Secondary | ICD-10-CM | POA: Diagnosis not present

## 2023-11-05 DIAGNOSIS — G20A1 Parkinson's disease without dyskinesia, without mention of fluctuations: Secondary | ICD-10-CM | POA: Diagnosis present

## 2023-11-05 DIAGNOSIS — Z79899 Other long term (current) drug therapy: Secondary | ICD-10-CM

## 2023-11-05 DIAGNOSIS — K920 Hematemesis: Principal | ICD-10-CM | POA: Diagnosis present

## 2023-11-05 DIAGNOSIS — Z90710 Acquired absence of both cervix and uterus: Secondary | ICD-10-CM

## 2023-11-05 DIAGNOSIS — F0283 Dementia in other diseases classified elsewhere, unspecified severity, with mood disturbance: Secondary | ICD-10-CM | POA: Diagnosis not present

## 2023-11-05 DIAGNOSIS — R4182 Altered mental status, unspecified: Secondary | ICD-10-CM | POA: Diagnosis present

## 2023-11-05 DIAGNOSIS — J90 Pleural effusion, not elsewhere classified: Secondary | ICD-10-CM | POA: Diagnosis not present

## 2023-11-05 DIAGNOSIS — I10 Essential (primary) hypertension: Secondary | ICD-10-CM | POA: Diagnosis not present

## 2023-11-05 DIAGNOSIS — R911 Solitary pulmonary nodule: Secondary | ICD-10-CM | POA: Diagnosis not present

## 2023-11-05 DIAGNOSIS — Z880 Allergy status to penicillin: Secondary | ICD-10-CM

## 2023-11-05 DIAGNOSIS — K2970 Gastritis, unspecified, without bleeding: Secondary | ICD-10-CM | POA: Diagnosis not present

## 2023-11-05 DIAGNOSIS — R4189 Other symptoms and signs involving cognitive functions and awareness: Secondary | ICD-10-CM | POA: Diagnosis not present

## 2023-11-05 DIAGNOSIS — R410 Disorientation, unspecified: Secondary | ICD-10-CM | POA: Diagnosis not present

## 2023-11-05 DIAGNOSIS — F319 Bipolar disorder, unspecified: Secondary | ICD-10-CM | POA: Diagnosis present

## 2023-11-05 DIAGNOSIS — K56699 Other intestinal obstruction unspecified as to partial versus complete obstruction: Secondary | ICD-10-CM | POA: Diagnosis not present

## 2023-11-05 DIAGNOSIS — K922 Gastrointestinal hemorrhage, unspecified: Secondary | ICD-10-CM | POA: Diagnosis present

## 2023-11-05 DIAGNOSIS — Z96651 Presence of right artificial knee joint: Secondary | ICD-10-CM | POA: Diagnosis present

## 2023-11-05 DIAGNOSIS — Z8249 Family history of ischemic heart disease and other diseases of the circulatory system: Secondary | ICD-10-CM

## 2023-11-05 LAB — COMPREHENSIVE METABOLIC PANEL WITH GFR
ALT: 15 U/L (ref 0–44)
AST: 20 U/L (ref 15–41)
Albumin: 3.5 g/dL (ref 3.5–5.0)
Alkaline Phosphatase: 86 U/L (ref 38–126)
Anion gap: 10 (ref 5–15)
BUN: 36 mg/dL — ABNORMAL HIGH (ref 8–23)
CO2: 19 mmol/L — ABNORMAL LOW (ref 22–32)
Calcium: 11.6 mg/dL — ABNORMAL HIGH (ref 8.9–10.3)
Chloride: 110 mmol/L (ref 98–111)
Creatinine, Ser: 0.95 mg/dL (ref 0.44–1.00)
GFR, Estimated: 59 mL/min — ABNORMAL LOW (ref >60)
Glucose, Bld: 124 mg/dL — ABNORMAL HIGH (ref 70–99)
Potassium: 3.8 mmol/L (ref 3.5–5.1)
Sodium: 138 mmol/L (ref 135–145)
Total Bilirubin: 0.3 mg/dL (ref 0.0–1.2)
Total Protein: 6.1 g/dL — ABNORMAL LOW (ref 6.5–8.1)

## 2023-11-05 LAB — CBC
HCT: 37 % (ref 36.0–46.0)
Hemoglobin: 11.4 g/dL — ABNORMAL LOW (ref 12.0–15.0)
MCH: 31.8 pg (ref 26.0–34.0)
MCHC: 30.8 g/dL (ref 30.0–36.0)
MCV: 103.4 fL — ABNORMAL HIGH (ref 80.0–100.0)
Platelets: 282 K/uL (ref 150–400)
RBC: 3.58 MIL/uL — ABNORMAL LOW (ref 3.87–5.11)
RDW: 15.5 % (ref 11.5–15.5)
WBC: 13.3 K/uL — ABNORMAL HIGH (ref 4.0–10.5)
nRBC: 0 % (ref 0.0–0.2)

## 2023-11-05 LAB — LIPASE, BLOOD: Lipase: 30 U/L (ref 11–51)

## 2023-11-05 NOTE — ED Triage Notes (Addendum)
 Pt BIB EMS from [facility].  Staff reports coffee ground emesis x 1 prior to calling EMS.  Pt has had poor po intake per staff. Baseline dementia.  Pt has received 600 mL of LR with 18G RW.  Hx of SBO surgery in September Pt also has outbreak of shingles across upper abdomen

## 2023-11-05 NOTE — ED Provider Notes (Signed)
 [place] EMERGENCY DEPARTMENT AT [facility] Provider Note   CSN: [id number] Arrival date & time: 11/05/23  2215     Patient presents with: Emesis   Tracey [name] is a 83 y.o. female.  {Add pertinent medical, surgical, social history, OB history to YEP:67052} The history is provided by the patient, the EMS personnel and medical records.  Emesis Tracey [name] is a 83 y.o. female who presents to the Emergency Department complaining of *** Vomiting.  She presents to the ED by EMS from [facility] following episode of emesis.  Has baseline dementia. Poor oral intake. Recent shingles. Received LR 600 prior to ED arrival.   [facility] [phone] Eliquis lithium  valtrex Denies pain    Prior to Admission medications   Medication Sig Start Date End Date Taking? Authorizing Provider  acetaminophen  (TYLENOL ) 325 MG tablet Take 650 mg by mouth every 4 (four) hours as needed (for pain).    [provider]  ALPRAZolam  (XANAX ) 0.5 MG tablet Take 1 tablet (0.5 mg total) by mouth 2 (two) times daily as needed for anxiety. 10/27/23   Jadine Toribio SQUIBB, MD  amiodarone  (PACERONE ) 100 MG tablet Take 1 tablet (100 mg total) by mouth daily. 10/28/23   Jadine Toribio SQUIBB, MD  amoxicillin (AMOXIL) 500 MG tablet Take 2,000 mg by mouth as directed. 1 hour prior to dental procedure per [facility] from [facility]    [provider]  apixaban (ELIQUIS) 2.5 MG TABS tablet Take 1 tablet (2.5 mg total) by mouth 2 (two) times daily. 10/27/23   Jadine Toribio SQUIBB, MD  cycloSPORINE  (RESTASIS ) 0.05 % ophthalmic emulsion Place 1 drop into both eyes 2 (two) times daily.    [provider]  lactose free nutrition (BOOST) LIQD Take 237 mLs by mouth See admin instructions. Drink 237 ml's by mouth in the morning and afternoon    [provider]  latanoprost  (XALATAN ) 0.005 % ophthalmic solution Place 1 drop into both eyes at bedtime.    [provider]  lidocaine   (LIDODERM ) 5 % Place 1 patch onto the skin daily. Remove & Discard patch within 12 hours or as directed by MD 10/28/23   Jadine Toribio SQUIBB, MD  lithium  carbonate 150 MG capsule TAKE ONE CAPSULE BY MOUTH TWICE DAILY WITH MEALS 05/06/23   Mast, Man X, NP  losartan  (COZAAR ) 50 MG tablet TAKE ONE TABLET BY MOUTH ONCE DAILY 10/28/22   Veludandi, Prashanthi, MD  Multiple Vitamin (MULTIVITAMIN WITH MINERALS) TABS tablet Take 1 tablet by mouth daily. 10/28/23   Jadine Toribio SQUIBB, MD  ondansetron  (ZOFRAN ) 4 MG tablet Take 4 mg by mouth every 6 (six) hours as needed for nausea.    [provider]  polyethylene glycol (MIRALAX  / GLYCOLAX ) 17 g packet Take 17 g by mouth every other day.    [provider]  pravastatin  (PRAVACHOL ) 10 MG tablet Take 5 mg by mouth every evening.    [provider]  SYSTANE 0.4-0.3 % SOLN Place 1 drop into both eyes every 4 (four) hours as needed (for dryness).    [provider]  Timolol  Maleate, Once-Daily, 0.5 % SOLN Place 1 drop into both eyes daily.    [provider]  topiramate  (TOPAMAX ) 25 MG tablet TAKE ONE TABLET BY MOUTH TWICE DAILY IN THE MORNING AND IN THE EVENING Patient taking differently: Take 25 mg by mouth See admin instructions. Take 25 mg by mouth at 7 AM and 4 PM 06/16/23   Mast,  Man X, NP  White Petrolatum-Mineral Oil (SYSTANE NIGHTTIME) OINT Place 1 application  into both eyes at bedtime.    [provider]    Allergies: Lisinopril, Penicillins, Adhesive [tape], Atorvastatin, and Dilaudid  [hydromorphone  hcl]    Review of Systems  Gastrointestinal:  Positive for vomiting.  All other systems reviewed and are negative.   Updated Vital Signs BP 104/63   Pulse 69   Temp 98.3 F (36.8 C) (Oral)   Resp 18   SpO2 100%   Physical Exam Vitals and nursing note reviewed.  Constitutional:      Appearance: She is well-developed.     Comments: drowsy  HENT:     Head: Normocephalic and atraumatic.   Cardiovascular:     Rate and Rhythm: Normal rate and regular rhythm.     Heart sounds: No murmur heard. Pulmonary:     Effort: Pulmonary effort is normal. No respiratory distress.     Breath sounds: Normal breath sounds.  Abdominal:     General: There is distension.     Palpations: Abdomen is soft.     Tenderness: There is no guarding or rebound.     Comments: Healed midline abdominal incision.  Vesicular rash to LUQ with local erythema.  Mild generalized abdominal tenderness  Musculoskeletal:        General: No tenderness.  Skin:    General: Skin is warm and dry.  Neurological:     Comments: Drowsy.  Awakens to verbal stimuli, looks around the room.  Confused.       (all labs ordered are listed, but only abnormal results are displayed) Labs Reviewed  COMPREHENSIVE METABOLIC PANEL WITH GFR - Abnormal; Notable for the following components:      Result Value   CO2 19 (*)    Glucose, Bld 124 (*)    BUN 36 (*)    Calcium 11.6 (*)    Total Protein 6.1 (*)    GFR, Estimated 59 (*)    All other components within normal limits  CBC - Abnormal; Notable for the following components:   WBC 13.3 (*)    RBC 3.58 (*)    Hemoglobin 11.4 (*)    MCV 103.4 (*)    All other components within normal limits  LIPASE, BLOOD  URINALYSIS, ROUTINE W REFLEX MICROSCOPIC    EKG: None  Radiology: No results found.  {Document cardiac monitor, telemetry assessment procedure when appropriate:32947} Procedures   Medications Ordered in the ED - No data to display    {Click here for ABCD2, HEART and other calculators REFRESH Note before signing:1}                              Medical Decision Making Amount and/or Complexity of Data Reviewed Labs: ordered.   ***  {Document critical care time when appropriate  Document review of labs and clinical decision tools ie CHADS2VASC2, etc  Document your independent review of radiology images and any outside records  Document your discussion with  family members, caretakers and with consultants  Document social determinants of health affecting pt's care  Document your decision making why or why not admission, treatments were needed:32947:::1}   Final diagnoses:  None    ED Discharge Orders     None

## 2023-11-06 ENCOUNTER — Other Ambulatory Visit: Payer: Self-pay

## 2023-11-06 ENCOUNTER — Inpatient Hospital Stay (HOSPITAL_COMMUNITY)

## 2023-11-06 ENCOUNTER — Encounter (HOSPITAL_COMMUNITY): Payer: Self-pay

## 2023-11-06 ENCOUNTER — Emergency Department (HOSPITAL_COMMUNITY)

## 2023-11-06 DIAGNOSIS — K2971 Gastritis, unspecified, with bleeding: Secondary | ICD-10-CM | POA: Diagnosis present

## 2023-11-06 DIAGNOSIS — B029 Zoster without complications: Secondary | ICD-10-CM

## 2023-11-06 DIAGNOSIS — R4182 Altered mental status, unspecified: Secondary | ICD-10-CM

## 2023-11-06 DIAGNOSIS — Z96651 Presence of right artificial knee joint: Secondary | ICD-10-CM | POA: Diagnosis present

## 2023-11-06 DIAGNOSIS — Z90710 Acquired absence of both cervix and uterus: Secondary | ICD-10-CM | POA: Diagnosis not present

## 2023-11-06 DIAGNOSIS — K920 Hematemesis: Principal | ICD-10-CM

## 2023-11-06 DIAGNOSIS — F319 Bipolar disorder, unspecified: Secondary | ICD-10-CM | POA: Diagnosis present

## 2023-11-06 DIAGNOSIS — I10 Essential (primary) hypertension: Secondary | ICD-10-CM | POA: Diagnosis present

## 2023-11-06 DIAGNOSIS — I480 Paroxysmal atrial fibrillation: Secondary | ICD-10-CM | POA: Diagnosis present

## 2023-11-06 DIAGNOSIS — F0283 Dementia in other diseases classified elsewhere, unspecified severity, with mood disturbance: Secondary | ICD-10-CM | POA: Diagnosis present

## 2023-11-06 DIAGNOSIS — G20A1 Parkinson's disease without dyskinesia, without mention of fluctuations: Secondary | ICD-10-CM | POA: Diagnosis present

## 2023-11-06 DIAGNOSIS — Z9842 Cataract extraction status, left eye: Secondary | ICD-10-CM | POA: Diagnosis not present

## 2023-11-06 DIAGNOSIS — Z862 Personal history of diseases of the blood and blood-forming organs and certain disorders involving the immune mechanism: Secondary | ICD-10-CM

## 2023-11-06 DIAGNOSIS — Z8249 Family history of ischemic heart disease and other diseases of the circulatory system: Secondary | ICD-10-CM | POA: Diagnosis not present

## 2023-11-06 DIAGNOSIS — Z880 Allergy status to penicillin: Secondary | ICD-10-CM | POA: Diagnosis not present

## 2023-11-06 DIAGNOSIS — E785 Hyperlipidemia, unspecified: Secondary | ICD-10-CM | POA: Diagnosis present

## 2023-11-06 DIAGNOSIS — Z860100 Personal history of colon polyps, unspecified: Secondary | ICD-10-CM | POA: Diagnosis not present

## 2023-11-06 DIAGNOSIS — K449 Diaphragmatic hernia without obstruction or gangrene: Secondary | ICD-10-CM | POA: Diagnosis present

## 2023-11-06 DIAGNOSIS — H409 Unspecified glaucoma: Secondary | ICD-10-CM | POA: Diagnosis present

## 2023-11-06 DIAGNOSIS — K2970 Gastritis, unspecified, without bleeding: Secondary | ICD-10-CM | POA: Diagnosis present

## 2023-11-06 DIAGNOSIS — Z9841 Cataract extraction status, right eye: Secondary | ICD-10-CM | POA: Diagnosis not present

## 2023-11-06 DIAGNOSIS — K922 Gastrointestinal hemorrhage, unspecified: Secondary | ICD-10-CM | POA: Diagnosis present

## 2023-11-06 DIAGNOSIS — R4189 Other symptoms and signs involving cognitive functions and awareness: Secondary | ICD-10-CM

## 2023-11-06 DIAGNOSIS — Z888 Allergy status to other drugs, medicaments and biological substances status: Secondary | ICD-10-CM | POA: Diagnosis not present

## 2023-11-06 DIAGNOSIS — Z79899 Other long term (current) drug therapy: Secondary | ICD-10-CM | POA: Diagnosis not present

## 2023-11-06 DIAGNOSIS — R410 Disorientation, unspecified: Secondary | ICD-10-CM | POA: Diagnosis not present

## 2023-11-06 DIAGNOSIS — Z85831 Personal history of malignant neoplasm of soft tissue: Secondary | ICD-10-CM | POA: Diagnosis not present

## 2023-11-06 DIAGNOSIS — J90 Pleural effusion, not elsewhere classified: Secondary | ICD-10-CM | POA: Diagnosis present

## 2023-11-06 DIAGNOSIS — Z7901 Long term (current) use of anticoagulants: Secondary | ICD-10-CM | POA: Diagnosis not present

## 2023-11-06 LAB — IRON AND TIBC
Iron: 48 ug/dL (ref 28–170)
Saturation Ratios: 19 % (ref 10.4–31.8)
TIBC: 249 ug/dL — ABNORMAL LOW (ref 250–450)
UIBC: 201 ug/dL

## 2023-11-06 LAB — FERRITIN: Ferritin: 233 ng/mL (ref 11–307)

## 2023-11-06 LAB — URINALYSIS, ROUTINE W REFLEX MICROSCOPIC
Bacteria, UA: NONE SEEN
Bilirubin Urine: NEGATIVE
Glucose, UA: NEGATIVE mg/dL
Hgb urine dipstick: NEGATIVE
Ketones, ur: NEGATIVE mg/dL
Leukocytes,Ua: NEGATIVE
Nitrite: NEGATIVE
Protein, ur: 30 mg/dL — AB
Specific Gravity, Urine: 1.012 (ref 1.005–1.030)
pH: 6 (ref 5.0–8.0)

## 2023-11-06 LAB — HEMOGLOBIN AND HEMATOCRIT, BLOOD
HCT: 27.1 % — ABNORMAL LOW (ref 36.0–46.0)
HCT: 30.4 % — ABNORMAL LOW (ref 36.0–46.0)
Hemoglobin: 8.1 g/dL — ABNORMAL LOW (ref 12.0–15.0)
Hemoglobin: 9.5 g/dL — ABNORMAL LOW (ref 12.0–15.0)

## 2023-11-06 LAB — TYPE AND SCREEN
ABO/RH(D): O POS
Antibody Screen: NEGATIVE

## 2023-11-06 LAB — VITAMIN B12: Vitamin B-12: 646 pg/mL (ref 180–914)

## 2023-11-06 LAB — CBC
HCT: 29.4 % — ABNORMAL LOW (ref 36.0–46.0)
Hemoglobin: 9.3 g/dL — ABNORMAL LOW (ref 12.0–15.0)
MCH: 32.6 pg (ref 26.0–34.0)
MCHC: 31.6 g/dL (ref 30.0–36.0)
MCV: 103.2 fL — ABNORMAL HIGH (ref 80.0–100.0)
Platelets: 248 K/uL (ref 150–400)
RBC: 2.85 MIL/uL — ABNORMAL LOW (ref 3.87–5.11)
RDW: 15.9 % — ABNORMAL HIGH (ref 11.5–15.5)
WBC: 8.9 K/uL (ref 4.0–10.5)
nRBC: 0 % (ref 0.0–0.2)

## 2023-11-06 LAB — RETICULOCYTES
Immature Retic Fract: 14 % (ref 2.3–15.9)
RBC.: 2.55 MIL/uL — ABNORMAL LOW (ref 3.87–5.11)
Retic Count, Absolute: 45.1 K/uL (ref 19.0–186.0)
Retic Ct Pct: 1.8 % (ref 0.4–3.1)

## 2023-11-06 MED ORDER — DEXTROSE IN LACTATED RINGERS 5 % IV SOLN: INTRAVENOUS | Status: DC

## 2023-11-06 MED ORDER — ALPRAZOLAM 0.5 MG PO TABS
0.5000 mg | ORAL_TABLET | Freq: Two times a day (BID) | ORAL | Status: DC | PRN
  Administered 2023-11-06 – 2023-11-07 (×3): 0.5 mg via ORAL
  Filled 2023-11-06 (×3): qty 1

## 2023-11-06 MED ORDER — TIMOLOL MALEATE 0.5 % OP SOLN
1.0000 [drp] | Freq: Every day | OPHTHALMIC | Status: DC
  Administered 2023-11-07 – 2023-11-08 (×2): 1 [drp] via OPHTHALMIC
  Filled 2023-11-06: qty 5

## 2023-11-06 MED ORDER — ACETAMINOPHEN 325 MG PO TABS
650.0000 mg | ORAL_TABLET | Freq: Four times a day (QID) | ORAL | Status: DC | PRN
  Administered 2023-11-06 – 2023-11-08 (×3): 650 mg via ORAL
  Filled 2023-11-06 (×3): qty 2

## 2023-11-06 MED ORDER — LACTATED RINGERS IV BOLUS
1000.0000 mL | Freq: Once | INTRAVENOUS | Status: AC
  Administered 2023-11-06: 1000 mL via INTRAVENOUS

## 2023-11-06 MED ORDER — VALACYCLOVIR HCL 500 MG PO TABS: 1000.0000 mg | ORAL_TABLET | Freq: Three times a day (TID) | ORAL | Status: DC

## 2023-11-06 MED ORDER — ONDANSETRON HCL 4 MG/2ML IJ SOLN: 4.0000 mg | Freq: Four times a day (QID) | INTRAMUSCULAR | Status: DC | PRN

## 2023-11-06 MED ORDER — OXYCODONE HCL 5 MG PO TABS
5.0000 mg | ORAL_TABLET | Freq: Four times a day (QID) | ORAL | Status: DC | PRN
  Administered 2023-11-07: 5 mg via ORAL
  Filled 2023-11-06 (×2): qty 1

## 2023-11-06 MED ORDER — LATANOPROST 0.005 % OP SOLN
1.0000 [drp] | Freq: Every day | OPHTHALMIC | Status: DC
  Administered 2023-11-06 – 2023-11-07 (×2): 1 [drp] via OPHTHALMIC
  Filled 2023-11-06: qty 2.5

## 2023-11-06 MED ORDER — TIMOLOL MALEATE (ONCE-DAILY) 0.5 % OP SOLN: 1.0000 [drp] | Freq: Every day | OPHTHALMIC | Status: DC

## 2023-11-06 MED ORDER — SODIUM CHLORIDE 0.9 % IV BOLUS
500.0000 mL | Freq: Once | INTRAVENOUS | Status: AC
  Administered 2023-11-06: 500 mL via INTRAVENOUS

## 2023-11-06 MED ORDER — SODIUM CHLORIDE 0.9% FLUSH: 3.0000 mL | INTRAVENOUS | Status: DC | PRN

## 2023-11-06 MED ORDER — TOPIRAMATE 25 MG PO TABS
25.0000 mg | ORAL_TABLET | Freq: Two times a day (BID) | ORAL | Status: DC
  Administered 2023-11-06 – 2023-11-08 (×4): 25 mg via ORAL
  Filled 2023-11-06 (×4): qty 1

## 2023-11-06 MED ORDER — AMIODARONE HCL 100 MG PO TABS
100.0000 mg | ORAL_TABLET | Freq: Every day | ORAL | Status: DC
  Administered 2023-11-07 – 2023-11-08 (×2): 100 mg via ORAL
  Filled 2023-11-06 (×3): qty 1

## 2023-11-06 MED ORDER — CYCLOSPORINE 0.05 % OP EMUL
1.0000 [drp] | Freq: Two times a day (BID) | OPHTHALMIC | Status: DC
  Administered 2023-11-06 – 2023-11-08 (×4): 1 [drp] via OPHTHALMIC
  Filled 2023-11-06 (×4): qty 30

## 2023-11-06 MED ORDER — SODIUM CHLORIDE 0.9 % IV SOLN: 250.0000 mL | INTRAVENOUS | Status: AC | PRN

## 2023-11-06 MED ORDER — SODIUM CHLORIDE 0.9% FLUSH
3.0000 mL | Freq: Two times a day (BID) | INTRAVENOUS | Status: DC
  Administered 2023-11-06 – 2023-11-08 (×3): 3 mL via INTRAVENOUS

## 2023-11-06 MED ORDER — PANTOPRAZOLE SODIUM 40 MG IV SOLR
40.0000 mg | Freq: Once | INTRAVENOUS | Status: AC
  Administered 2023-11-06: 40 mg via INTRAVENOUS
  Filled 2023-11-06: qty 10

## 2023-11-06 MED ORDER — VALACYCLOVIR HCL 500 MG PO TABS
1000.0000 mg | ORAL_TABLET | Freq: Two times a day (BID) | ORAL | Status: AC
  Administered 2023-11-06 – 2023-11-07 (×4): 1000 mg via ORAL
  Filled 2023-11-06 (×4): qty 2

## 2023-11-06 MED ORDER — PANTOPRAZOLE SODIUM 40 MG IV SOLR
40.0000 mg | Freq: Two times a day (BID) | INTRAVENOUS | Status: DC
  Administered 2023-11-06 (×2): 40 mg via INTRAVENOUS
  Filled 2023-11-06 (×2): qty 10

## 2023-11-06 MED ORDER — ONDANSETRON HCL 4 MG PO TABS: 4.0000 mg | ORAL_TABLET | Freq: Four times a day (QID) | ORAL | Status: DC | PRN

## 2023-11-06 MED ORDER — IOHEXOL 300 MG/ML  SOLN
80.0000 mL | Freq: Once | INTRAMUSCULAR | Status: AC | PRN
Start: 2023-11-06 — End: 2023-11-06
  Administered 2023-11-06: 80 mL via INTRAVENOUS

## 2023-11-06 MED ORDER — SODIUM CHLORIDE 0.9% FLUSH
3.0000 mL | Freq: Two times a day (BID) | INTRAVENOUS | Status: DC
  Administered 2023-11-06 – 2023-11-08 (×4): 3 mL via INTRAVENOUS

## 2023-11-06 MED ORDER — ACETAMINOPHEN 650 MG RE SUPP: 650.0000 mg | Freq: Four times a day (QID) | RECTAL | Status: DC | PRN

## 2023-11-06 MED ORDER — LITHIUM CARBONATE 150 MG PO CAPS
150.0000 mg | ORAL_CAPSULE | Freq: Two times a day (BID) | ORAL | Status: DC
  Administered 2023-11-07 – 2023-11-08 (×3): 150 mg via ORAL
  Filled 2023-11-06 (×7): qty 1

## 2023-11-06 NOTE — H&P (Signed)
 History and Physical    Tracey [name] FMW:[id number] DOB: Oct 17, 1940 DOA: 11/05/2023  PCP: Mast, Man X, NP   Patient coming from: Home   Chief Complaint:  Chief Complaint  Patient presents with   Emesis   ED TRIAGE note:  Pt BIB EMS from [facility].  Staff reports coffee ground emesis x 1 prior to calling EMS.  Pt has had poor po intake per staff. Baseline dementia.  Pt has received 600 mL of LR with 18G RW.  Hx of SBO surgery in September Pt also has outbreak of shingles across upper abdomen    HPI:  Tracey [name] is a 83 y.o. female with medical history significant of past medical history of bipolar disorder, cognitive impairment, SBO underwent ex lap and repair of a small bowel enterectomy complicated with pelvic abscess treated with IV antibiotic, paroxysmal atrial fibrillation on Eliquis, aortic atherosclerosis, and postoperative blood loss anemia presented to emergency department complaining of episode of coffee-ground emesis, poor oral intake.  Given at baseline patient has dementia unable to provide any history. Patient has poor oral intake and recently started treatment for shingles.  Per chart review patient being seen by outpatient internal medicine clinic on 10/20 for evaluation for poor p.o. intake, nursing staff reported patient found confused as compared to her baseline and develop vesicular rash of the left upper back and diagnosed with shingles.  ED Course:  At presentation to ED patient is hemodynamically stable.  Afebrile. CBC showing leukocytosis 13.3, stable H&H and normal platelet count. CMP showing low bicarb, elevated BUN 36 which is around baseline.  Normal lipase level.  CT abdomen pelvis showing sliding type hiatal hernia.Postsurgical changes in the colon without obstructive change.  Further decrease in size of abdominal wound seroma anteriorly.  Small right-sided pleural effusion new from the prior study.  Chest x-ray no active disease  process.  In the ED patient received IV Protonix  40 mg.  Unable to provide any history given underlying baseline dementia.  Unable to touch base with patient's family over phone as well.  Hospitalist consulted for further evaluation management of 1 episode of coffee-ground emesis questionable GI bleed, poor oral intake and altered mental status    Significant labs in the ED: Lab Orders         Lipase, blood         Comprehensive metabolic panel         CBC         Urinalysis, Routine w reflex microscopic -Urine, Clean Catch         Occult blood card to lab, stool         Comprehensive metabolic panel         CBC       Review of Systems:  Review of Systems  Unable to perform ROS: Mental status change    Past Medical History:  Diagnosis Date   Anemia    Anxiety    Arthritis    Cardiac conduction disorder 03/30/2012   Overview:  STORY: ETT 03/09/2012 Echo 03/07/2012 normal Dr Ladona, bradycardia felt due to glaucoma eye drops   Cervical dystonia    Diverticulosis of colon    DOE (dyspnea on exertion) 03/25/2018   Gastroesophageal cancer (HCC)    GERD (gastroesophageal reflux disease)    GIST (gastrointestinal stroma tumor), malignant, colon (HCC)    Glaucoma    Heart murmur    History of colon polyps 10/24/2008   Hypertension    Major neurocognitive disorder  due to Parkinson's disease, possible    Tremors possible parkinsons   Manic disorder, single episode, in full remission 12/01/2009   Sixth nerve palsy     Past Surgical History:  Procedure Laterality Date   BILATERAL SALPINGOOPHORECTOMY  09/22/2007   CATARACT EXTRACTION Bilateral    ESOPHAGOGASTRODUODENOSCOPY (EGD) WITH PROPOFOL  N/A 03/25/2022   Procedure: ESOPHAGOGASTRODUODENOSCOPY (EGD) WITH PROPOFOL ;  Surgeon: Legrand Victory LITTIE DOUGLAS, MD;  Location: [facility];  Service: Gastroenterology;  Laterality: N/A;   Gastrointestinal Stroma Tumor,  Other  1960   GIST Surgery   ILEOCECETOMY  09/22/2007   LAPAROSCOPIC LYSIS  OF ADHESIONS N/A 10/07/2023   Procedure: LYSIS, ADHESIONS, LAPAROSCOPIC;  Surgeon: Dasie Leonor LITTIE, MD;  Location: [facility];  Service: General;  Laterality: N/A;   LAPAROSCOPY N/A 10/07/2023   Procedure: DIAGNOSTIC LAPAROSCOPY, EXPLORATORY LAPAROTOMY WITH LYSIS OF ADHESIONS, REPAIR OF SMALL BOWEL ENTEROTOMY;  Surgeon: Dasie Leonor LITTIE, MD;  Location: [facility];  Service: General;  Laterality: N/A;   OVARIAN CYST REMOVAL Right 1967   SMALL INTESTINE SURGERY  09/22/2007   TOTAL KNEE ARTHROPLASTY Right 10/05/2019   Procedure: RIGHT TOTAL KNEE ARTHROPLASTY;  Surgeon: Addie Cordella Hamilton, MD;  Location: [facility];  Service: Orthopedics;  Laterality: Right;   TOTAL VAGINAL HYSTERECTOMY  1986   Fibroids     reports that she has never smoked. She has never been exposed to tobacco smoke. She has never used smokeless tobacco. She reports that she does not drink alcohol  and does not use drugs.  Allergies  Allergen Reactions   Lisinopril Cough   Penicillins Itching and Other (See Comments)    50 years ago   Adhesive [Tape] Itching   Atorvastatin Itching   Dilaudid  [Hydromorphone  Hcl] Itching    Family History  Problem Relation Age of Onset   Congestive Heart Failure Mother    Dementia Mother    Heart disease Father    Diabetes Father    Lung cancer Son    Liver disease Neg Hx    Esophageal cancer Neg Hx    Colon cancer Neg Hx     Prior to Admission medications   Medication Sig Start Date End Date Taking? Authorizing Provider  acetaminophen  (TYLENOL ) 325 MG tablet Take 650 mg by mouth every 4 (four) hours as needed (for pain).    [provider]  ALPRAZolam  (XANAX ) 0.5 MG tablet Take 1 tablet (0.5 mg total) by mouth 2 (two) times daily as needed for anxiety. 10/27/23   Jadine Toribio SQUIBB, MD  amiodarone  (PACERONE ) 100 MG tablet Take 1 tablet (100 mg total) by mouth daily. 10/28/23   Jadine Toribio SQUIBB, MD  amoxicillin (AMOXIL) 500 MG tablet Take 2,000 mg by mouth as directed. 1 hour prior  to dental procedure per [facility] from [facility]    [provider]  apixaban (ELIQUIS) 2.5 MG TABS tablet Take 1 tablet (2.5 mg total) by mouth 2 (two) times daily. 10/27/23   Jadine Toribio SQUIBB, MD  cycloSPORINE  (RESTASIS ) 0.05 % ophthalmic emulsion Place 1 drop into both eyes 2 (two) times daily.    [provider]  lactose free nutrition (BOOST) LIQD Take 237 mLs by mouth See admin instructions. Drink 237 ml's by mouth in the morning and afternoon    [provider]  latanoprost  (XALATAN ) 0.005 % ophthalmic solution Place 1 drop into both eyes at bedtime.    [provider]  lidocaine  (LIDODERM ) 5 % Place 1 patch onto the skin daily. Remove & Discard patch within  12 hours or as directed by MD 10/28/23   Jadine Toribio SQUIBB, MD  lithium  carbonate 150 MG capsule TAKE ONE CAPSULE BY MOUTH TWICE DAILY WITH MEALS 05/06/23   Mast, Man X, NP  losartan  (COZAAR ) 50 MG tablet TAKE ONE TABLET BY MOUTH ONCE DAILY 10/28/22   Veludandi, Prashanthi, MD  Multiple Vitamin (MULTIVITAMIN WITH MINERALS) TABS tablet Take 1 tablet by mouth daily. 10/28/23   Jadine Toribio SQUIBB, MD  ondansetron  (ZOFRAN ) 4 MG tablet Take 4 mg by mouth every 6 (six) hours as needed for nausea.    [provider]  polyethylene glycol (MIRALAX  / GLYCOLAX ) 17 g packet Take 17 g by mouth every other day.    [provider]  pravastatin  (PRAVACHOL ) 10 MG tablet Take 5 mg by mouth every evening.    [provider]  SYSTANE 0.4-0.3 % SOLN Place 1 drop into both eyes every 4 (four) hours as needed (for dryness).    [provider]  Timolol  Maleate, Once-Daily, 0.5 % SOLN Place 1 drop into both eyes daily.    [provider]  topiramate  (TOPAMAX ) 25 MG tablet TAKE ONE TABLET BY MOUTH TWICE DAILY IN THE MORNING AND IN THE EVENING Patient taking differently: Take 25 mg by mouth See admin instructions. Take 25 mg by mouth at 7 AM and 4 PM 06/16/23   Mast, Man X, NP   White Petrolatum-Mineral Oil (SYSTANE NIGHTTIME) OINT Place 1 application  into both eyes at bedtime.    [provider]     Physical Exam: Vitals:   11/06/23 0225 11/06/23 0400 11/06/23 0430 11/06/23 0458  BP:  (!) 116/57 (!) 102/58   Pulse:  60 61   Resp:  13 15   Temp: 97.9 F (36.6 C)   98.5 F (36.9 C)  TempSrc: Oral   Axillary  SpO2:  100% 100%     Physical Exam Constitutional:      General: She is not in acute distress.    Appearance: She is ill-appearing.  HENT:     Mouth/Throat:     Mouth: Mucous membranes are moist.  Eyes:     Pupils: Pupils are equal, round, and reactive to light.  Cardiovascular:     Rate and Rhythm: Normal rate and regular rhythm.     Pulses: Normal pulses.     Heart sounds: Normal heart sounds.  Pulmonary:     Effort: Pulmonary effort is normal.     Breath sounds: Normal breath sounds.  Abdominal:     Palpations: Abdomen is soft.     Tenderness: There is no abdominal tenderness.     Hernia: No hernia is present.  Musculoskeletal:     Cervical back: Neck supple.  Skin:    Capillary Refill: Capillary refill takes less than 2 seconds.     Findings: Rash present.  Neurological:     Comments: Patient is drowsy and sleepy  Psychiatric:     Comments: Unable to assess      Labs on Admission: I have personally reviewed following labs and imaging studies  CBC: Recent Labs  Lab 11/05/23 2240  WBC 13.3*  HGB 11.4*  HCT 37.0  MCV 103.4*  PLT 282   Basic Metabolic Panel: Recent Labs  Lab 11/05/23 2240  NA 138  K 3.8  CL 110  CO2 19*  GLUCOSE 124*  BUN 36*  CREATININE 0.95  CALCIUM 11.6*   GFR: Estimated Creatinine Clearance: 34.7 mL/min (by C-G formula based on SCr of 0.95  mg/dL). Liver Function Tests: Recent Labs  Lab 11/05/23 2240  AST 20  ALT 15  ALKPHOS 86  BILITOT 0.3  PROT 6.1*  ALBUMIN  3.5   Recent Labs  Lab 11/05/23 2240  LIPASE 30   No results for input(s): AMMONIA in the last 168  hours. Coagulation Profile: No results for input(s): INR, PROTIME in the last 168 hours. Cardiac Enzymes: No results for input(s): CKTOTAL, CKMB, CKMBINDEX, TROPONINI, TROPONINIHS in the last 168 hours. BNP (last 3 results) Recent Labs    08/06/23 1609  BNP 67.4   HbA1C: No results for input(s): HGBA1C in the last 72 hours. CBG: No results for input(s): GLUCAP in the last 168 hours. Lipid Profile: No results for input(s): CHOL, HDL, LDLCALC, TRIG, CHOLHDL, LDLDIRECT in the last 72 hours. Thyroid  Function Tests: No results for input(s): TSH, T4TOTAL, FREET4, T3FREE, THYROIDAB in the last 72 hours. Anemia Panel: No results for input(s): VITAMINB12, FOLATE, FERRITIN, TIBC, IRON , RETICCTPCT in the last 72 hours. Urine analysis:    Component Value Date/Time   COLORURINE YELLOW 11/06/2023 0129   APPEARANCEUR CLEAR 11/06/2023 0129   LABSPEC 1.012 11/06/2023 0129   PHURINE 6.0 11/06/2023 0129   GLUCOSEU NEGATIVE 11/06/2023 0129   HGBUR NEGATIVE 11/06/2023 0129   BILIRUBINUR NEGATIVE 11/06/2023 0129   KETONESUR NEGATIVE 11/06/2023 0129   PROTEINUR 30 (A) 11/06/2023 0129   NITRITE NEGATIVE 11/06/2023 0129   LEUKOCYTESUR NEGATIVE 11/06/2023 0129    Radiological Exams on Admission: I have personally reviewed images CT ABDOMEN PELVIS W CONTRAST Result Date: 11/06/2023 CLINICAL DATA:  Abdominal pain with cough and ground emesis EXAM: CT ABDOMEN AND PELVIS WITH CONTRAST TECHNIQUE: Multidetector CT imaging of the abdomen and pelvis was performed using the standard protocol following bolus administration of intravenous contrast. RADIATION DOSE REDUCTION: This exam was performed according to the departmental dose-optimization program which includes automated exposure control, adjustment of the mA and/or kV according to patient size and/or use of iterative reconstruction technique. CONTRAST:  80mL OMNIPAQUE  IOHEXOL  300 MG/ML  SOLN COMPARISON:   10/24/2023 FINDINGS: Lower chest: Small right-sided pleural effusion is seen new from the prior exam. Sliding-type hiatal hernia is noted. Hepatobiliary: Scattered cysts are seen within the liver. The gallbladder is within normal limits. Pancreas: Unremarkable. No pancreatic ductal dilatation or surrounding inflammatory changes. Spleen: Normal in size without focal abnormality. Adrenals/Urinary Tract: Adrenal glands are well visualized and within normal limits. Kidneys demonstrate a normal enhancement pattern. Scattered cysts are noted within the left kidney. No follow-up is recommended. The bladder is well distended. Previously seen air within the bladder has resolved. Stomach/Bowel: Changes consistent with prior ileocecectomy are again noted. Hiatal hernia is noted. Stomach is otherwise within normal limits. No small bowel abnormality is noted. Colon shows no obstructive changes. Vascular/Lymphatic: Aortic atherosclerosis. No enlarged abdominal or pelvic lymph nodes. Reproductive: Status post hysterectomy. No adnexal masses. Other: Previously seen pelvic fluid collection has resolved. Small seroma is noted along the anterior abdominal wound site measuring up to 15 mm again decreased in size prior exam. No new focal fluid collection is seen. Musculoskeletal: Degenerative changes of lumbar spine are noted. IMPRESSION: Sliding-type hiatal hernia. Postsurgical changes in the colon without obstructive change. Further decrease in size of abdominal wound seroma anteriorly. Small right-sided pleural effusion new from the prior study. Electronically Signed   By: Oneil Devonshire M.D.   On: 11/06/2023 01:12   DG Chest Port 1 View Result Date: 11/06/2023 CLINICAL DATA:  Vomiting EXAM: PORTABLE CHEST 1 VIEW COMPARISON:  10/08/2023 FINDINGS:  The heart size and mediastinal contours are within normal limits. Both lungs are clear. The visualized skeletal structures are unremarkable. IMPRESSION: No active disease. Electronically  Signed   By: Luke Bun M.D.   On: 11/06/2023 00:02     EKG: My personal interpretation of EKG shows: Sinus rhythm heart rate 67.  Nonspecific intraventricular conduction delay.    Assessment/Plan: Principal Problem:   Coffee ground emesis Active Problems:   Altered mental status, unspecified   Paroxysmal atrial fibrillation (HCC)   Cognitive impairment   Bipolar disorder (HCC)   History of anemia   Shingles rash of the abdomen    Assessment and Plan: Coffee-ground emesis-concern for GI bleed -Presented to emergency department referred from nursing home as nursing home staff reported that patient has 1 episode of coffee-ground emesis however when EMS called they did not notice any evidence of coffee-ground emesis or vomiting.  At baseline patient has dementia and poor historian however per nursing home patient is more confused having poor oral intake from her baseline. - At presentation to ED patient patient is borderline hypotensive otherwise hemodynamically stable. Patient is drowsy/sleepy unable to answer any question.  Responding to pain only and withdrawing from pain as well. -CBC showing stable H&H 11.4 and 103, leukocytosis 13.3.  Normal platelet count. - It is unclear if patient really have a episode of coffee-ground emesis. -CT abdomen pelvis no acute intra-abdominal finding.  It showed sliding-type Hartle hernia.  Postsurgical changes.  Abdominal wound seroma anteriorly decreased in size.  Small right-sided pleural effusion. - Obtaining FOBT - Continue IV Protonix  40 mg twice daily. - Continue to trend CBC. -Keeping patient NPO.  Starting D5 LR 125 cc/h. -Consulted [facility] Dr. Elicia for further evaluation.    Acute shingles of abdomen -Physical exam revealed bilateral upper abdominal wall rash with crust. - Continue Valtrex 1 g 3 times daily.  Altered mental status History of bipolar disorder History of cognitive impairment -Patient has bipolar disorder  and cognitive impairment.  Nursing home reported patient is more confused as compared to her baseline and having poor oral intake. - During my evaluation at the bedside patient only responding to pain.  Not as having any questions. - Concern for altered mental status in the setting of shingles outbreak, poor oral intake and questionable GI bleed. Continue to monitor improvement of mental status. -Continue lithium  and Topamax .   History of SBO status in  s/p lysis adhesion and repair of small bowel enterectomy 09/2023 -CT abdomen pelvis no acute intra-abdominal finding.  Decreased size of abdominal wound seroma anteriorly.  Paroxysmal atrial fibrillation Continue amiodarone . Holding Eliquis in the setting of concern for GI bleed.  If hemoglobin remains stable and patient does not develop any episodes of GI bleed and if FOBT remains negative in that case can initiate Eliquis again.    DVT prophylaxis:  SCDs and TED hose.  Holding Eliquis. Code Status:  Full Code-by default Diet: N.p.o. Family Communication: Unsuccessful attempt to reach patient's son over phone. Disposition Plan: Continue monitor improvement of mental status, monitor CBC and development of any GI bleed. Consults: Gastroenterology Admission status:   Inpatient, Telemetry bed  Severity of Illness: The appropriate patient status for this patient is INPATIENT. Inpatient status is judged to be reasonable and necessary in order to provide the required intensity of service to ensure the patient's safety. The patient's presenting symptoms, physical exam findings, and initial radiographic and laboratory data in the context of their chronic comorbidities is felt to place  them at high risk for further clinical deterioration. Furthermore, it is not anticipated that the patient will be medically stable for discharge from the hospital within 2 midnights of admission.   * I certify that at the point of admission it is my clinical judgment  that the patient will require inpatient hospital care spanning beyond 2 midnights from the point of admission due to high intensity of service, high risk for further deterioration and high frequency of surveillance required.DEWAINE    Reilly Blades, MD [facility]  How to contact the [facility] Attending or Consulting provider 7A - 7P or covering provider during after hours 7P -7A, for this patient.  Check the care team in [facility] and look for a) attending/consulting [facility] provider listed and b) the [facility] team listed Log into [email or web address] and use [facility] universal password to access. If you do not have the password, please contact the hospital operator. Locate the [facility] provider you are looking for under [facility] and page to a number that you can be directly reached. If you still have difficulty reaching the provider, please page the [facility] (Director on Call) for the Hospitalists listed on amion for assistance.  11/06/2023, 6:18 AM

## 2023-11-06 NOTE — Progress Notes (Signed)
 PROGRESS NOTE    Tracey [name]  FMW:[id number] DOB: 1940-02-28 DOA: 11/05/2023 PCP: Mast, Man X, NP    Brief Narrative:   Tracey [name] is a 83 y.o. female with past medical history significant for paroxysmal atrial fibrillation on Eliquis, bipolar disorder, cognitive impairment, history of SBO s/p ex lap with repair of small bowel enterotomy complicated by pelvic abscess s/p IV antibiotics, aortic atherosclerosis, recent outbreak of shingles currently on Valtrex who presented to [facility] ED on 11/05/2023 via EMS from friends home with reports of coffee-ground emesis.  Staff also reports patient with poor oral intake.  Patient unable to participate in HPI given her underlying cognitive impairment/dementia.  In the ED, temperature 98.3 F, HR 69, RR 18, BP 104/63, SpO2 100% on room air.  WBC 13.3, hemoglobin 11.4, platelet count 282.  Sodium 138, potassium 3.8, chloride 110, CO2 19, glucose 124, BUN 36, creatinine 0.95.  Lipase 30.  AST 20, ALT 15, total bilirubin 0.3.  Urinalysis unrevealing.  Chest x-ray with no active cardiopulmonary disease process.  CT abdomen/pelvis with sliding-type hiatal hernia, postsurgical change in the colon without obstructive change, decrease in size of abdominal wound seroma anteriorly, small right sided pleural effusion.  TRH was consulted for further evaluation management of upper GI bleed.  Assessment & Plan:   Hematemesis concerning for upper GI bleed Patient presenting from SNF with reported episode of hematemesis x 1 observed by office staff.  Complicated by use of Eliquis outpatient for paroxysmal atrial fibrillation.  Also reported not eating/drinking well.  Hemoglobin 11.4 on admission. -- Hgb 11.4>9.3 (Baseline 8-9, was 8.8 on 10/13) -- Check anemia panel -- Holding Eliquis -- Protonix  40 mg IV q12h -- H&H every 6 hours -- Clear liquid diet, n.p.o. after midnight -- Transfuse for hemoglobin less than 7.0  Shingles, isolated Patient recently diagnosed  with shingles to left thorax which is isolated and not disseminated.  Started on Valtrex outpatient. -- Valtrex 1000 mg p.o. twice daily for 7-day course -- Contact isolation precautions until lesions crusted over (no need for airborne as not a disseminated infection)  Paroxysmal atrial fibrillation on anticoagulation -- Amiodarone  100 mg p.o. daily -- Holding Eliquis as above -- Monitor on telemetry  HTN BP borderline low, 107/51. -- Hold home losartan  25 mg p.o. daily -- Continue monitor BP closely  HLD -- Hold home atorvastatin for now  Bipolar disorder Cognitive impairment/dementia -- Delirium precautions -- Get up during the day -- Encourage a familiar face to remain present throughout the day -- Keep blinds open and lights on during daylight hours -- Minimize the use of opioids/benzodiazepines -- Xanax  0.5 mg p.o. twice daily as needed anxiety -- Lithium  150 mg p.o. twice daily -- Topamax  25 m p.o. twice daily  History of SBO s/p ex lap with repair of small bowel enterotomy complicated by pelvic abscess s/p antibiotics CT abdomen/pelvis notable for postsurgical change in the colon without obstructive change, decreased size of abdominal wound seroma anteriorly.   -- Supportive care   DVT prophylaxis: SCDs Start: 11/06/23 0521 Place TED hose Start: 11/06/23 0521    Code Status: Full Code Family Communication: No family present at bedside this morning  Disposition Plan:  Level of care: Telemetry Status is: Inpatient Remains inpatient appropriate because: Close monitoring of hemoglobin    Consultants:  None  Procedures:  None  Antimicrobials:  None   Subjective: Patient seen and examined at bedside, lying in bed.  Remains in ED holding area.  Pleasantly confused but  alert.  No further episodes of hematemesis since arrival to the ED.  Hemoglobin down to 9.3, but could be secondary to hemodilutional given poor oral intake.  Will continue to monitor hemoglobin.   Patient not the best candidate for endoscopic procedure with anesthesia and would like to avoid.  No other questions or concerns at this time.  Denies headache, no chest pain, no shortness of breath, no abdominal pain.  No acute events overnight per nursing staff.  Objective: Vitals:   11/06/23 1430 11/06/23 1445 11/06/23 1524 11/06/23 1700  BP:   (!) 120/59 (!) 113/47  Pulse: 74 71 64 65  Resp: 18 (!) 23 15 15   Temp:   98.4 F (36.9 C)   TempSrc:   Oral   SpO2: 100% 100% 100% 100%    Intake/Output Summary (Last 24 hours) at 11/06/2023 1723 Last data filed at 11/06/2023 0126 Gross per 24 hour  Intake --  Output 650 ml  Net -650 ml   There were no vitals filed for this visit.  Examination:  Physical Exam: GEN: NAD, alert, pleasantly confused, chronically ill in appearance HEENT: NCAT, PERRL, EOMI, sclera clear, dry mucous membrane PULM: CTAB w/o wheezes/crackles, normal respiratory effort, on room air with SpO2 100% at rest CV: RRR w/o M/G/R GI: abd soft, NTND, + BS MSK: no peripheral edema, moves all extremities independently NEURO: No focal neurological deficit besides cognition PSYCH: normal mood/affect Integumentary: Vesicular rash noted to left thorax in a typical dermatomal pattern consistent with shingles, no other concerning rashes/lesions/wounds noted on exposed skin surfaces    Data Reviewed: I have personally reviewed following labs and imaging studies  CBC: Recent Labs  Lab 11/05/23 2240 11/06/23 0606  WBC 13.3* 8.9  HGB 11.4* 9.3*  HCT 37.0 29.4*  MCV 103.4* 103.2*  PLT 282 248   Basic Metabolic Panel: Recent Labs  Lab 11/05/23 2240  NA 138  K 3.8  CL 110  CO2 19*  GLUCOSE 124*  BUN 36*  CREATININE 0.95  CALCIUM 11.6*   GFR: Estimated Creatinine Clearance: 34.7 mL/min (by C-G formula based on SCr of 0.95 mg/dL). Liver Function Tests: Recent Labs  Lab 11/05/23 2240  AST 20  ALT 15  ALKPHOS 86  BILITOT 0.3  PROT 6.1*  ALBUMIN  3.5    Recent Labs  Lab 11/05/23 2240  LIPASE 30   No results for input(s): AMMONIA in the last 168 hours. Coagulation Profile: No results for input(s): INR, PROTIME in the last 168 hours. Cardiac Enzymes: No results for input(s): CKTOTAL, CKMB, CKMBINDEX, TROPONINI in the last 168 hours. BNP (last 3 results) Recent Labs    10/04/23 1310  PROBNP 529.0*   HbA1C: No results for input(s): HGBA1C in the last 72 hours. CBG: No results for input(s): GLUCAP in the last 168 hours. Lipid Profile: No results for input(s): CHOL, HDL, LDLCALC, TRIG, CHOLHDL, LDLDIRECT in the last 72 hours. Thyroid  Function Tests: No results for input(s): TSH, T4TOTAL, FREET4, T3FREE, THYROIDAB in the last 72 hours. Anemia Panel: No results for input(s): VITAMINB12, FOLATE, FERRITIN, TIBC, IRON , RETICCTPCT in the last 72 hours. Sepsis Labs: No results for input(s): PROCALCITON, LATICACIDVEN in the last 168 hours.  No results found for this or any previous visit (from the past 240 hours).       Radiology Studies: CT HEAD WO CONTRAST ( ) Result Date: 11/06/2023 EXAM: CT HEAD WITHOUT CONTRAST 11/06/2023 08:27:58 AM TECHNIQUE: CT of the head was performed without the administration of intravenous contrast. Automated exposure control, iterative  reconstruction, and/or weight based adjustment of the mA/kV was utilized to reduce the radiation dose to as low as reasonably achievable. COMPARISON: Brain MRI 01/13/2020. Head CT 10/16/2021. CLINICAL HISTORY: Delirium. FINDINGS: BRAIN AND VENTRICLES: No acute hemorrhage. No evidence of acute infarct. No hydrocephalus. No extra-axial collection. No mass effect or midline shift. Volume remains within normal limits for age. Minimal chronic white matter hypodensity. No suspicious intracranial vascular hyperdensity. ORBITS: No acute abnormality. SINUSES: No acute abnormality. SOFT TISSUES AND SKULL: No acute soft tissue  abnormality. No skull fracture. IMPRESSION: 1. Negative for age non-contrast head CT. Electronically signed by: Helayne Hurst MD 11/06/2023 08:36 AM EDT RP Workstation: [id number]   CT ABDOMEN PELVIS W CONTRAST Result Date: 11/06/2023 CLINICAL DATA:  Abdominal pain with cough and ground emesis EXAM: CT ABDOMEN AND PELVIS WITH CONTRAST TECHNIQUE: Multidetector CT imaging of the abdomen and pelvis was performed using the standard protocol following bolus administration of intravenous contrast. RADIATION DOSE REDUCTION: This exam was performed according to the departmental dose-optimization program which includes automated exposure control, adjustment of the mA and/or kV according to patient size and/or use of iterative reconstruction technique. CONTRAST:  80mL OMNIPAQUE  IOHEXOL  300 MG/ML  SOLN COMPARISON:  10/24/2023 FINDINGS: Lower chest: Small right-sided pleural effusion is seen new from the prior exam. Sliding-type hiatal hernia is noted. Hepatobiliary: Scattered cysts are seen within the liver. The gallbladder is within normal limits. Pancreas: Unremarkable. No pancreatic ductal dilatation or surrounding inflammatory changes. Spleen: Normal in size without focal abnormality. Adrenals/Urinary Tract: Adrenal glands are well visualized and within normal limits. Kidneys demonstrate a normal enhancement pattern. Scattered cysts are noted within the left kidney. No follow-up is recommended. The bladder is well distended. Previously seen air within the bladder has resolved. Stomach/Bowel: Changes consistent with prior ileocecectomy are again noted. Hiatal hernia is noted. Stomach is otherwise within normal limits. No small bowel abnormality is noted. Colon shows no obstructive changes. Vascular/Lymphatic: Aortic atherosclerosis. No enlarged abdominal or pelvic lymph nodes. Reproductive: Status post hysterectomy. No adnexal masses. Other: Previously seen pelvic fluid collection has resolved. Small seroma is noted along  the anterior abdominal wound site measuring up to 15 mm again decreased in size prior exam. No new focal fluid collection is seen. Musculoskeletal: Degenerative changes of lumbar spine are noted. IMPRESSION: Sliding-type hiatal hernia. Postsurgical changes in the colon without obstructive change. Further decrease in size of abdominal wound seroma anteriorly. Small right-sided pleural effusion new from the prior study. Electronically Signed   By: Oneil Devonshire M.D.   On: 11/06/2023 01:12   DG Chest Port 1 View Result Date: 11/06/2023 CLINICAL DATA:  Vomiting EXAM: PORTABLE CHEST 1 VIEW COMPARISON:  10/08/2023 FINDINGS: The heart size and mediastinal contours are within normal limits. Both lungs are clear. The visualized skeletal structures are unremarkable. IMPRESSION: No active disease. Electronically Signed   By: Luke Bun M.D.   On: 11/06/2023 00:02        Scheduled Meds:  amiodarone   100 mg Oral Daily   lithium  carbonate  150 mg Oral BID WC   pantoprazole  (PROTONIX ) IV  40 mg Intravenous Q12H   sodium chloride  flush  3 mL Intravenous Q12H   sodium chloride  flush  3 mL Intravenous Q12H   topiramate   25 mg Oral BID   valACYclovir  1,000 mg Oral BID   Continuous Infusions:  sodium chloride      dextrose  5% lactated ringers  125 mL/hr at 11/06/23 1701     LOS: 0 days    Time spent:  56 minutes spent on 11/06/2023 caring for this patient face-to-face including chart review, ordering labs/tests, documenting, discussion with nursing staff, consultants, updating family and interview/physical exam    Camellia PARAS [place], DO [facility] Available via Epic secure chat 7am-7pm After these hours, please refer to coverage provider listed on [email or web address] 11/06/2023, 5:23 PM

## 2023-11-06 NOTE — ED Notes (Signed)
 Pt readjusted in med post medication administration. Pt had no complaints, followed commands, and was cooperative. Warm blanket provided, no soiling noted, and infusions running appropriately. Resting and in NAD. Will continue to monitor

## 2023-11-06 NOTE — Plan of Care (Signed)
  Problem: Education: Goal: Knowledge of General Education information will improve Description: Including pain rating scale, medication(s)/side effects and non-pharmacologic comfort measures Outcome: Progressing   Problem: Clinical Measurements: Goal: Ability to maintain clinical measurements within normal limits will improve Outcome: Progressing   Problem: Safety: Goal: Ability to remain free from injury will improve Outcome: Progressing   

## 2023-11-06 NOTE — ED Notes (Signed)
 Gave patient some water . Patient has no complaints of nausea or vomiting.

## 2023-11-06 NOTE — ED Notes (Signed)
 Inc care, full linen change. All needs met.

## 2023-11-06 NOTE — ED Notes (Signed)
 Patient is resting comfortably.

## 2023-11-07 DIAGNOSIS — K920 Hematemesis: Secondary | ICD-10-CM | POA: Diagnosis not present

## 2023-11-07 LAB — CBC
HCT: 27.4 % — ABNORMAL LOW (ref 36.0–46.0)
Hemoglobin: 8.6 g/dL — ABNORMAL LOW (ref 12.0–15.0)
MCH: 33 pg (ref 26.0–34.0)
MCHC: 31.4 g/dL (ref 30.0–36.0)
MCV: 105 fL — ABNORMAL HIGH (ref 80.0–100.0)
Platelets: 236 K/uL (ref 150–400)
RBC: 2.61 MIL/uL — ABNORMAL LOW (ref 3.87–5.11)
RDW: 16.2 % — ABNORMAL HIGH (ref 11.5–15.5)
WBC: 7.4 K/uL (ref 4.0–10.5)
nRBC: 0 % (ref 0.0–0.2)

## 2023-11-07 LAB — BASIC METABOLIC PANEL WITH GFR
Anion gap: 6 (ref 5–15)
BUN: 18 mg/dL (ref 8–23)
CO2: 20 mmol/L — ABNORMAL LOW (ref 22–32)
Calcium: 11 mg/dL — ABNORMAL HIGH (ref 8.9–10.3)
Chloride: 121 mmol/L — ABNORMAL HIGH (ref 98–111)
Creatinine, Ser: 0.83 mg/dL (ref 0.44–1.00)
GFR, Estimated: >60 mL/min (ref >60)
Glucose, Bld: 109 mg/dL — ABNORMAL HIGH (ref 70–99)
Potassium: 3.8 mmol/L (ref 3.5–5.1)
Sodium: 147 mmol/L — ABNORMAL HIGH (ref 135–145)

## 2023-11-07 LAB — FOLATE: Folate: 20 ng/mL

## 2023-11-07 MED ORDER — PANTOPRAZOLE SODIUM 40 MG PO TBEC
40.0000 mg | DELAYED_RELEASE_TABLET | Freq: Two times a day (BID) | ORAL | Status: DC
  Administered 2023-11-07 – 2023-11-08 (×3): 40 mg via ORAL
  Filled 2023-11-07 (×3): qty 1

## 2023-11-07 NOTE — Evaluation (Signed)
 Occupational Therapy Evaluation Patient Details Name: Tracey [name] MRN: [id number] DOB: 1940/11/06 Today's Date: 11/07/2023   History of Present Illness   Patient is an 83 year old female who presented to the ED from SNF with reports of decreased PO intake and coffee ground emesis.  Dx with hematemesis concerning for upper GI bleed and abdominal shingles.  PMHx includes HTN, HLD, paroxysmal A-fib, cardiac conduction disorder, cervical dystonia, DOE, gastroesophageal CA, multiple GI surgeries (most recent ex lap of SBO, 10/07/23), OA, right TKA, anxiety, bipolar disorder, cognitive impairment     Clinical Impressions PTA, patient was receiving short term rehab at her home ALF s/p abdominal surgery; prior to that admission (10/01/23), patient was Independent - ModI with self care, with ALF staff provided meals and medication management.  Acute illness has impacted patient's strength & activity tolerance which have led to further functional decline.  Patient currently requires ModA for LB ADLs and +2 assist for functional mobility.  Patient will benefit from continued inpatient follow up therapy, < 3 hours/day.  Acute OT will continue to follow patient while in the hospital to address performance deficits described below.  Thank you for allowing us  to participate in the care of this patient.     If plan is discharge home, recommend the following:   A lot of help with bathing/dressing/bathroom;A lot of help with walking and/or transfers     Functional Status Assessment   Patient has had a recent decline in their functional status and demonstrates the ability to make significant improvements in function in a reasonable and predictable amount of time.     Equipment Recommendations   None recommended by OT      Precautions/Restrictions   Precautions Precautions: Fall Recall of Precautions/Restrictions: Impaired Restrictions Weight Bearing Restrictions Per Provider Order:  No Other Position/Activity Restrictions: Abdominal surgery 9/23     Mobility Transfers Overall transfer level: Needs assistance Equipment used: Rolling walker (2 wheels) Transfers: Sit to/from Stand Sit to Stand: +2 safety/equipment, +2 physical assistance, Mod assist   General transfer comment: Patient required ModA x2 for initial sit > stand; varying levels during ambulation; maintained static standing grossly CGA with occasional tactile cues for postural alignment      Balance Overall balance assessment: Needs assistance Sitting-balance support: Feet supported Sitting balance-Leahy Scale: Fair   Standing balance support: Reliant on assistive device for balance, During functional activity, Bilateral upper extremity supported Standing balance-Leahy Scale: Poor Standing balance comment: Occasional verbal & tactile cues required for postural alignment     ADL either performed or assessed with clinical judgement   ADL Overall ADL's : Needs assistance/impaired Eating/Feeding: Set up;Sitting Eating/Feeding Details (indicate cue type and reason): Patient required extre time and bilateral grasp to raise cup to mouth Grooming: Wash/dry face;Set up;Sitting   Upper Body Bathing: Minimal assistance;Sitting   Lower Body Bathing: Moderate assistance;Sitting/lateral leans   Upper Body Dressing : Minimal assistance;Sitting   Lower Body Dressing: Maximal assistance;Sit to/from stand   Toilet Transfer: Stand-pivot;Rolling walker (2 wheels);BSC/3in1;Moderate assistance;+2 for safety/equipment   Toileting- Clothing Manipulation and Hygiene: Maximal assistance;Sit to/from stand   Functional mobility during ADLs: +2 for safety/equipment;Rolling walker (2 wheels);Minimal assistance General ADL Comments: Assist level varied from CGA to ModA (x2)     Vision Baseline Vision/History: 0 No visual deficits (Patient able to read clock on wall) Ability to See in Adequate Light: 0  Adequate Patient Visual Report: No change from baseline Vision Assessment?: No apparent visual deficits  Pertinent Vitals/Pain Pain Assessment Pain Assessment: No/denies pain Pain Descriptors / Indicators: Headache (Patient endorsed headache but was unable to quantify)     Extremity/Trunk Assessment Upper Extremity Assessment Upper Extremity Assessment: Right hand dominant;RUE deficits/detail;LUE deficits/detail;Generalized weakness RUE Deficits / Details: AROM WFL except shoulder flexion 90 deg.  PROM shoulder flexion 110 deg.  Gross strength 3+/5 RUE Sensation: WNL RUE Coordination: decreased fine motor LUE Deficits / Details: AROM WFL except shoulder flexion 90 deg. PROM shoulder flexion 135 deg. Gross strength 3+/5 LUE Sensation: WNL LUE Coordination: decreased fine motor   Lower Extremity Assessment Lower Extremity Assessment: Defer to PT evaluation   Cervical / Trunk Assessment Cervical / Trunk Assessment: Kyphotic   Communication Communication Communication: No apparent difficulties   Cognition Arousal: Lethargic, Alert Behavior During Therapy: WFL for tasks assessed/performed Cognition: History of cognitive impairments OT - Cognition Comments: Patient was lethargic upon arrival with blunt affect. When patient's grandson, who had arrived from out of state, entered the room, she became fully alert.  Patient became tearful and demonstrative and maintained alertness for remainder of session.   Following commands: Intact       Cueing  General Comments      Patient's mood, affect, and social engagement increased dramatically upon seeing grandson.           Home Living Family/patient expects to be discharged to:: Assisted living (Memory care)   Home Equipment: Rollator (4 wheels)   Additional Comments: Patient is resident at [facility]F      Prior Functioning/Environment Prior Level of Function : Independent/Modified Independent;Needs  assist   Mobility Comments: Patient endorses use of rollator for household & community mobility ADLs Comments: Staff at ALF provides meals and med management    OT Problem List: Decreased strength;Decreased range of motion;Decreased activity tolerance;Decreased coordination;Decreased cognition;Decreased safety awareness   OT Treatment/Interventions: Self-care/ADL training;Therapeutic exercise;Neuromuscular education;Energy conservation;Therapeutic activities;Cognitive remediation/compensation;Patient/family education      OT Goals(Current goals can be found in the care plan section)   Acute Rehab OT Goals Patient Stated Goal: Get better, return home, and resume therapy OT Goal Formulation: With patient/family Time For Goal Achievement: 11/21/23 Potential to Achieve Goals: Good ADL Goals Pt Will Perform Eating: with modified independence;with adaptive utensils (while using weighted utensils and while exhibiting a decrease in BUE tremors) Pt Will Perform Grooming: with modified independence;standing Pt Will Perform Lower Body Dressing: with min assist;with adaptive equipment;sit to/from stand (while demonstrating appropriate safety awareness) Pt Will Transfer to Toilet: with contact guard assist;bedside commode Pt Will Perform Toileting - Clothing Manipulation and hygiene: with min assist;sit to/from stand   OT Frequency:  Min 2X/week       AM-PAC OT 6 Clicks Daily Activity     Outcome Measure Help from another person eating meals?: None Help from another person taking care of personal grooming?: A Little Help from another person toileting, which includes using toliet, bedpan, or urinal?: A Lot Help from another person bathing (including washing, rinsing, drying)?: A Lot Help from another person to put on and taking off regular upper body clothing?: A Little Help from another person to put on and taking off regular lower body clothing?: A Lot 6 Click Score: 16   End of Session  Equipment Utilized During Treatment: Gait belt;Rolling walker (2 wheels) Nurse Communication: Mobility status  Activity Tolerance: Patient tolerated treatment well Patient left: in bed;with call bell/phone within reach;with family/visitor present;with chair alarm set  OT Visit Diagnosis: Unsteadiness on feet (R26.81);Muscle weakness (generalized) (M62.81);Other symptoms  and signs involving cognitive function;Other symptoms and signs involving the nervous system (R29.898)                Time: 8784-8748 OT Time Calculation (min): 36 min Charges:  OT General Charges $OT Visit: 1 Visit OT Evaluation $OT Eval Moderate Complexity: 1 Mod OT Treatments $Therapeutic Activity: 8-22 mins  Camy Leder B. Wilfred Dayrit, MS, OTR/L 11/07/2023, 2:38 PM

## 2023-11-07 NOTE — Evaluation (Signed)
 Physical Therapy Evaluation Patient Details Name: Tracey [name] MRN: [id number] DOB: [birth year] Today's Date: 11/07/2023  History of Present Illness  83 y.o. female recent outbreak of shingles currently on Valtrex who presented to [facility] ED on 11/05/[facility]23 [facility] with reports of coffee-ground emesis. Pt with past medical history significant for paroxysmal atrial fibrillation on Eliquis, bipolar disorder, cognitive impairment, history of SBO s/p ex lap with repair of small bowel enterotomy 10/07/23 complicated by pelvic abscess s/p IV antibiotics, aortic atherosclerosis  Clinical Impression  Pt admitted with above diagnosis. At baseline pt resides at [facility]. Following recent hospitalization [facility]. Today pt was oriented to self only, she was able to follow commands. Pt required mod assist for spine to sit. +2 max assist sit to stand and to pivot to bedside commode then to recliner with RW, pt has a significant posterior lean in standing. Patient will benefit from continued inpatient follow up therapy, <3 hours/day.  Pt currently with functional limitations due to the deficits listed below (see PT Problem List). Pt will benefit from acute skilled PT to increase their independence and safety with mobility to allow discharge.           If plan is discharge home, recommend the following: Two people to help with walking and/or transfers;A lot of help with bathing/dressing/bathroom;Assistance with cooking/housework;Assist for transportation;Help with stairs or ramp for entrance   Can travel by private vehicle   No    Equipment Recommendations None recommended by PT  Recommendations for Other Services       Functional Status Assessment Patient has had a recent decline in their functional status and demonstrates the ability to make significant improvements in function in a reasonable and predictable amount of time.     Precautions / Restrictions  Precautions Precautions: Fall Recall of Precautions/Restrictions: Impaired Precaution/Restrictions Comments: oriented to self only; recent abdominal surgery 9/23 Restrictions Weight Bearing Restrictions Per Provider Order: No      Mobility  Bed Mobility Overal bed mobility: Needs Assistance Bed Mobility: Supine to Sit     Supine to sit: Mod assist     General bed mobility comments: assist to raise trunk and pivot hips to EOB    Transfers Overall transfer level: Needs assistance Equipment used: Rolling walker (2 wheels) Transfers: Sit to/from Stand Sit to Stand: Max assist, +2 safety/equipment, +2 physical assistance   Step pivot transfers: Mod assist, +2 safety/equipment, +2 physical assistance       General transfer comment: narrow BOS, assist to power up, assist for balance 2* posterior lean during SPT to bedside commode with RW, then to recliner    Ambulation/Gait                  Stairs            Wheelchair Mobility     Tilt Bed    Modified Rankin (Stroke Patients Only)       Balance Overall balance assessment: Needs assistance Sitting-balance support: Feet supported Sitting balance-Leahy Scale: Fair Sitting balance - Comments: static sitting-good. dynamic sitting-fair+   Standing balance support: Reliant on assistive device for balance, During functional activity, Bilateral upper extremity supported Standing balance-Leahy Scale: Poor Standing balance comment: posterior lean requiring min to mod assist in standing with RW                             Pertinent Vitals/Pain Pain  Assessment Faces Pain Scale: No hurt    Home Living Family/patient expects to be discharged to:: Assisted living                 Home Equipment: Rollator (4 wheels) Additional Comments: Prior to abdominal surgery 9/23 pt was living at [facility], has a rollator and uses it intermittently, enjoys doing therapy and exercises classes. Pt  DCed to SNF at [facility] from that  admission.    Prior Function Prior Level of Function : Independent/Modified Independent;Needs assist             Mobility Comments: Prior to abdominal surgery 9/23 pt was ind with rollator intermittently       Extremity/Trunk Assessment   Upper Extremity Assessment Upper Extremity Assessment: Generalized weakness    Lower Extremity Assessment Lower Extremity Assessment: Generalized weakness (B knee ext 4/5)    Cervical / Trunk Assessment Cervical / Trunk Assessment: Kyphotic  Communication   Communication Communication: No apparent difficulties    Cognition Arousal: Alert Behavior During Therapy: WFL for tasks assessed/performed   PT - Cognitive impairments: History of cognitive impairments                       PT - Cognition Comments: AxO x 1, Hx Dementia but following all commands.  Retired Environmental manager.  Resides at [facility] at baseline Following commands: Intact       Cueing Cueing Techniques: Verbal cues     General Comments      Exercises     Assessment/Plan    PT Assessment Patient needs continued PT services  PT Problem List Decreased strength;Decreased activity tolerance;Decreased balance;Decreased mobility;Decreased cognition;Decreased knowledge of use of DME;Decreased safety awareness;Decreased knowledge of precautions;Cardiopulmonary status limiting activity       PT Treatment Interventions DME instruction;Gait training;Functional mobility training;Therapeutic activities;Therapeutic exercise;Balance training;Neuromuscular re-education;Patient/family education    PT Goals (Current goals can be found in the Care Plan section)  Acute Rehab PT Goals PT Goal Formulation: Patient unable to participate in goal setting Time For Goal Achievement: 11/21/23 Potential to Achieve Goals: Good    Frequency Min 2X/week     Co-evaluation               AM-PAC PT 6 Clicks Mobility   Outcome Measure Help needed turning from your back to your side while in a flat bed without using bedrails?: A Little Help needed moving from lying on your back to sitting on the side of a flat bed without using bedrails?: A Lot Help needed moving to and from a bed to a chair (including a wheelchair)?: Total Help needed standing up from a chair using your arms (e.g., wheelchair or bedside chair)?: Total Help needed to walk in hospital room?: Total Help needed climbing 3-5 steps with a railing? : Total 6 Click Score: 9    End of Session Equipment Utilized During Treatment: Gait belt Activity Tolerance: Patient limited by fatigue Patient left: in chair;with call bell/phone within reach;with chair alarm set;with nursing/sitter in room Nurse Communication: Mobility status (IV site bleeding) PT Visit Diagnosis: Muscle weakness (generalized) (M62.81);Other abnormalities of gait and mobility (R26.89);Unsteadiness on feet (R26.81);Difficulty in walking, not elsewhere classified (R26.2)    Time: 8877-8851 PT Time Calculation (min) (ACUTE ONLY): 26 min   Charges:   PT Evaluation $PT Eval Moderate Complexity: 1 Mod PT Treatments $Therapeutic Activity: 8-22 mins PT General Charges $$ ACUTE PT VISIT: 1 Visit  Sylvan Nest Kistler PT 11/07/2023  Acute Rehabilitation Services  Office [phone]

## 2023-11-07 NOTE — Progress Notes (Signed)
 PROGRESS NOTE    Sargun ROSA WYLY  FMW:[id number] DOB: 1940/11/03 DOA: 11/05/2023 PCP: Mast, Man X, NP    Brief Narrative:   Tracey [name] is a 83 y.o. female with past medical history significant for paroxysmal atrial fibrillation on Eliquis, bipolar disorder, cognitive impairment, history of SBO s/p ex lap with repair of small bowel enterotomy complicated by pelvic abscess s/p IV antibiotics, aortic atherosclerosis, recent outbreak of shingles currently on Valtrex who presented to [facility] ED on 11/05/[facility] with reports of coffee-ground emesis.  Staff also reports patient with poor oral intake.  Patient unable to participate in HPI given her underlying cognitive impairment/dementia.  In the ED, temperature 98.3 F, HR 69, RR 18, BP 104/63, SpO2 100% on room air.  WBC 13.3, hemoglobin 11.4, platelet count 282.  Sodium 138, potassium 3.8, chloride 110, CO2 19, glucose 124, BUN 36, creatinine 0.95.  Lipase 30.  AST 20, ALT 15, total bilirubin 0.3.  Urinalysis unrevealing.  Chest x-ray with no active cardiopulmonary disease process.  CT abdomen/pelvis with sliding-type hiatal hernia, postsurgical change in the colon without obstructive change, decrease in size of abdominal wound seroma anteriorly, small right sided pleural effusion.  TRH was consulted for further evaluation management of upper GI bleed.  Assessment & Plan:   Hematemesis concerning for upper GI bleed Patient presenting from SNF with reported episode of hematemesis x 1 observed by office staff.  Complicated by use of Eliquis outpatient for paroxysmal atrial fibrillation.  Also reported not eating/drinking well.  Hemoglobin 11.4 on admission.  Anemia panel with iron  48, TIBC 249, ferritin 233, folate 20.0, vitamin B12 646. -- Hgb 11.4>9.3>8.1>9.5>8.6 (Baseline 8-9, was 8.8 on 10/13); stable -- Holding Eliquis -- Protonix  40 mg IV q12h -- CBC daily -- Transfuse for hemoglobin less than 7.0  Shingles,  isolated Patient recently diagnosed with shingles to left thorax which is isolated and not disseminated.  Started on Valtrex outpatient. -- Valtrex 1000 mg p.o. twice daily for 7-day course -- Contact isolation precautions until lesions crusted over (no need for airborne as not a disseminated infection)  Paroxysmal atrial fibrillation on anticoagulation -- Amiodarone  100 mg p.o. daily -- Holding Eliquis as above -- Monitor on telemetry  HTN BP borderline low, 108/89. -- Hold home losartan  25 mg p.o. daily; may need to discontinue on discharge -- Continue monitor BP closely  HLD -- Hold home atorvastatin for now  Bipolar disorder Cognitive impairment/dementia -- Delirium precautions -- Get up during the day -- Encourage a familiar face to remain present throughout the day -- Keep blinds open and lights on during daylight hours -- Minimize the use of opioids/benzodiazepines -- Xanax  0.5 mg p.o. twice daily as needed anxiety -- Lithium  150 mg p.o. twice daily -- Topamax  25 m p.o. twice daily  History of SBO s/p ex lap with repair of small bowel enterotomy complicated by pelvic abscess s/p antibiotics CT abdomen/pelvis notable for postsurgical change in the colon without obstructive change, decreased size of abdominal wound seroma anteriorly.   -- Supportive care   DVT prophylaxis: SCDs Start: 11/06/23 0521 Place TED hose Start: 11/06/23 0521    Code Status: Full Code Family Communication: Updated son present at bedside this morning  Disposition Plan:  Level of care: Telemetry Status is: Inpatient Remains inpatient appropriate because: Will need reauthorization for return back to [facility]    Consultants:  None  Procedures:  None  Antimicrobials:  None   Subjective: Patient seen and examined  at bedside, lying in bed.  Son present at bedside.  Eating breakfast.  No complaints.  Hemoglobin remained stable.  Seen by PT this morning, per TOC will need  reauthorization for [facility] to [facility].  No other questions or concerns at this time.  Denies headache, no chest pain, no shortness of breath, no abdominal pain.  No acute events overnight per nursing staff.  Objective: Vitals:   11/07/23 0254 11/07/23 0620 11/07/23 0843 11/07/23 1200  BP: 117/73 108/89 111/78 122/63  Pulse: 65 63 (!) 56   Resp: 19 17 18 16   Temp: 97.8 F (36.6 C) 97.9 F (36.6 C) 97.6 F (36.4 C) 97.6 F (36.4 C)  TempSrc: Oral Oral Oral Oral  SpO2: 100% 100% 100% 100%  Height:        Intake/Output Summary (Last 24 hours) at 11/07/2023 1255 Last data filed at 11/07/2023 1000 Gross per 24 hour  Intake 1150 ml  Output 450 ml  Net 700 ml   There were no vitals filed for this visit.  Examination:  Physical Exam: GEN: NAD, alert, pleasantly confused, chronically ill in appearance HEENT: NCAT, PERRL, EOMI, sclera clear, dry mucous membrane PULM: CTAB w/o wheezes/crackles, normal respiratory effort, on room air with SpO2 100% at rest CV: RRR w/o M/G/R GI: abd soft, NTND, + BS MSK: no peripheral edema, moves all extremities independently NEURO: No focal neurological deficit besides cognition PSYCH: normal mood/affect Integumentary: Vesicular rash noted to left thorax in a typical dermatomal pattern consistent with shingles, no other concerning rashes/lesions/wounds noted on exposed skin surfaces    Data Reviewed: I have personally reviewed following labs and imaging studies  CBC: Recent Labs  Lab 11/05/23 2240 11/06/23 0606 11/06/23 2002 11/06/23 2157 11/07/23 0408  WBC 13.3* 8.9  --   --  7.4  HGB 11.4* 9.3* 8.1* 9.5* 8.6*  HCT 37.0 29.4* 27.1* 30.4* 27.4*  MCV 103.4* 103.2*  --   --  105.0*  PLT 282 248  --   --  236   Basic Metabolic Panel: Recent Labs  Lab 11/05/23 2240 11/07/23 0408  NA 138 147*  K 3.8 3.8  CL 110 121*  CO2 19* 20*  GLUCOSE 124* 109*  BUN 36* 18  CREATININE 0.95 0.83  CALCIUM 11.6* 11.0*    GFR: Estimated Creatinine Clearance: 39.7 mL/min (by C-G formula based on SCr of 0.83 mg/dL). Liver Function Tests: Recent Labs  Lab 11/05/23 2240  AST 20  ALT 15  ALKPHOS 86  BILITOT 0.3  PROT 6.1*  ALBUMIN  3.5   Recent Labs  Lab 11/05/23 2240  LIPASE 30   No results for input(s): AMMONIA in the last 168 hours. Coagulation Profile: No results for input(s): INR, PROTIME in the last 168 hours. Cardiac Enzymes: No results for input(s): CKTOTAL, CKMB, CKMBINDEX, TROPONINI in the last 168 hours. BNP (last 3 results) Recent Labs    10/04/23 1310  PROBNP 529.0*   HbA1C: No results for input(s): HGBA1C in the last 72 hours. CBG: No results for input(s): GLUCAP in the last 168 hours. Lipid Profile: No results for input(s): CHOL, HDL, LDLCALC, TRIG, CHOLHDL, LDLDIRECT in the last 72 hours. Thyroid  Function Tests: No results for input(s): TSH, T4TOTAL, FREET4, T3FREE, THYROIDAB in the last 72 hours. Anemia Panel: Recent Labs    11/06/23 2002 11/06/23 2157  VITAMINB12  --  646  FOLATE  --  20.0  FERRITIN  --  233  TIBC  --  249*  IRON   --  48  RETICCTPCT 1.8  --    Sepsis Labs: No results for input(s): PROCALCITON, LATICACIDVEN in the last 168 hours.  No results found for this or any previous visit (from the past 240 hours).       Radiology Studies: CT HEAD WO CONTRAST ( ) Result Date: 11/06/2023 EXAM: CT HEAD WITHOUT CONTRAST 11/06/2023 08:27:58 AM TECHNIQUE: CT of the head was performed without the administration of intravenous contrast. Automated exposure control, iterative reconstruction, and/or weight based adjustment of the mA/kV was utilized to reduce the radiation dose to as low as reasonably achievable. COMPARISON: Brain MRI 01/13/2020. Head CT 10/16/2021. CLINICAL HISTORY: Delirium. FINDINGS: BRAIN AND VENTRICLES: No acute hemorrhage. No evidence of acute infarct. No hydrocephalus. No extra-axial collection.  No mass effect or midline shift. Volume remains within normal limits for age. Minimal chronic white matter hypodensity. No suspicious intracranial vascular hyperdensity. ORBITS: No acute abnormality. SINUSES: No acute abnormality. SOFT TISSUES AND SKULL: No acute soft tissue abnormality. No skull fracture. IMPRESSION: 1. Negative for age non-contrast head CT. Electronically signed by: Helayne Hurst MD 11/06/2023 08:36 AM EDT RP Workstation: [id number]   CT ABDOMEN PELVIS W CONTRAST Result Date: 11/06/2023 CLINICAL DATA:  Abdominal pain with cough and ground emesis EXAM: CT ABDOMEN AND PELVIS WITH CONTRAST TECHNIQUE: Multidetector CT imaging of the abdomen and pelvis was performed using the standard protocol following bolus administration of intravenous contrast. RADIATION DOSE REDUCTION: This exam was performed according to the departmental dose-optimization program which includes automated exposure control, adjustment of the mA and/or kV according to patient size and/or use of iterative reconstruction technique. CONTRAST:  80mL OMNIPAQUE  IOHEXOL  300 MG/ML  SOLN COMPARISON:  10/24/2023 FINDINGS: Lower chest: Small right-sided pleural effusion is seen new from the prior exam. Sliding-type hiatal hernia is noted. Hepatobiliary: Scattered cysts are seen within the liver. The gallbladder is within normal limits. Pancreas: Unremarkable. No pancreatic ductal dilatation or surrounding inflammatory changes. Spleen: Normal in size without focal abnormality. Adrenals/Urinary Tract: Adrenal glands are well visualized and within normal limits. Kidneys demonstrate a normal enhancement pattern. Scattered cysts are noted within the left kidney. No follow-up is recommended. The bladder is well distended. Previously seen air within the bladder has resolved. Stomach/Bowel: Changes consistent with prior ileocecectomy are again noted. Hiatal hernia is noted. Stomach is otherwise within normal limits. No small bowel abnormality is  noted. Colon shows no obstructive changes. Vascular/Lymphatic: Aortic atherosclerosis. No enlarged abdominal or pelvic lymph nodes. Reproductive: Status post hysterectomy. No adnexal masses. Other: Previously seen pelvic fluid collection has resolved. Small seroma is noted along the anterior abdominal wound site measuring up to 15 mm again decreased in size prior exam. No new focal fluid collection is seen. Musculoskeletal: Degenerative changes of lumbar spine are noted. IMPRESSION: Sliding-type hiatal hernia. Postsurgical changes in the colon without obstructive change. Further decrease in size of abdominal wound seroma anteriorly. Small right-sided pleural effusion new from the prior study. Electronically Signed   By: Oneil Devonshire M.D.   On: 11/06/2023 01:12   DG Chest Port 1 View Result Date: 11/06/2023 CLINICAL DATA:  Vomiting EXAM: PORTABLE CHEST 1 VIEW COMPARISON:  10/08/2023 FINDINGS: The heart size and mediastinal contours are within normal limits. Both lungs are clear. The visualized skeletal structures are unremarkable. IMPRESSION: No active disease. Electronically Signed   By: Luke Bun M.D.   On: 11/06/2023 00:02        Scheduled Meds:  amiodarone   100 mg Oral Daily   cycloSPORINE   1 drop Both Eyes BID  latanoprost   1 drop Both Eyes QHS   lithium  carbonate  150 mg Oral BID WC   pantoprazole   40 mg Oral BID   sodium chloride  flush  3 mL Intravenous Q12H   sodium chloride  flush  3 mL Intravenous Q12H   timolol   1 drop Both Eyes Daily   topiramate   25 mg Oral BID   valACYclovir  1,000 mg Oral BID   Continuous Infusions:     LOS: 1 day    Time spent: 48 minutes spent on 11/07/2023 caring for this patient face-to-face including chart review, ordering labs/tests, documenting, discussion with nursing staff, consultants, updating family and interview/physical exam    Camellia PARAS [place], DO [facility] Available via Epic secure chat 7am-7pm After these hours, please  refer to coverage provider listed on [email or web address] 11/07/2023, 12:55 PM

## 2023-11-07 NOTE — TOC Initial Note (Addendum)
 Transition of Care [facility]) - Initial/Assessment Note    Patient Details  Name: Tracey [name] MRN: [id number] Date of Birth: 1940/09/02  [facility] CM/SW Contact:    Tawni CHRISTELLA Eva, LCSW Phone Number: 11/07/2023, 12:01 PM  Clinical Narrative:                 Pt from [facility], CSW spoke with [place] with admission , pt will need new insurance auth to return. Pt pending PT eval. Care management to follow.    Adden 12:35pm Pt's shara is pending . Care management to follow.   Expected Discharge Plan: Skilled Nursing Facility Barriers to Discharge: Continued Medical Work up, English as a second language teacher   Patient Goals and CMS Choice Patient states their goals for this hospitalization and ongoing recovery are:: sNF          Expected Discharge Plan and Services                                              Prior Living Arrangements/Services                       Activities of Daily Living   ADL Screening (condition at time of admission) Independently performs ADLs?: No Does the patient have a NEW difficulty with bathing/dressing/toileting/self-feeding that is expected to last >3 days?: No Does the patient have a NEW difficulty with getting in/out of bed, walking, or climbing stairs that is expected to last >3 days?: No Does the patient have a NEW difficulty with communication that is expected to last >3 days?: No Is the patient deaf or have difficulty hearing?: No Does the patient have difficulty seeing, even when wearing glasses/contacts?: No Does the patient have difficulty concentrating, remembering, or making decisions?: Yes  Permission Sought/Granted                  Emotional Assessment              Admission diagnosis:  Coffee ground emesis [K92.0] UGIB (upper gastrointestinal bleed) [K92.2] Nausea and vomiting, unspecified vomiting type [R11.2] Patient Active Problem List   Diagnosis Date Noted   Coffee  ground emesis 11/06/2023   History of anemia 11/06/2023   Shingles rash of the abdomen 11/06/2023   UGIB (upper gastrointestinal bleed) 11/06/2023   Postoperative ileus (HCC) 10/26/2023   Pelvic abscess in female 10/26/2023   Demand ischemia (HCC) 10/26/2023   Bipolar disorder (HCC) 10/26/2023   Bronchiectasis (HCC) 10/26/2023   Pulmonary nodule 10/26/2023   Acute postoperative anemia due to expected blood loss 10/09/2023   Hypernatremia 10/09/2023   Iron  deficiency anemia 10/09/2023   Paroxysmal atrial fibrillation (HCC) 10/08/2023   Hypotension 10/08/2023   Cognitive impairment 10/08/2023   Hypophosphatemia 10/08/2023   Left ear pain 10/08/2023   Protein-calorie malnutrition, severe 10/07/2023   Urinary frequency 09/19/2022   Gait abnormality 08/01/2022   Dry mouth 07/11/2022   Corn of foot 05/09/2022   Toe pain, left 05/09/2022   Abnormal finding on GI tract imaging 03/25/2022   Dysphagia 12/19/2021   Oropharyngeal candidiasis 10/10/2021   Varicose veins of left leg with edema 12/28/2020   Dermatitis 11/30/2020   Major neurocognitive disorder (HCC) 02/10/2020   Tinnitus of both ears 02/02/2020   Cervical dystonia 01/26/2020   CKD (chronic kidney disease) stage 3, GFR 30-59 ml/min (HCC) 01/26/2020  Headache 01/12/2020   Altered mental status, unspecified 01/12/2020   Speech abnormality 01/12/2020   Edema 07/01/2019   Right knee pain 06/17/2019   Hypercalcemia 06/17/2019   Slow transit constipation 05/31/2019   Bradycardia 05/31/2019   History of shingles 04/22/2019   Upper back pain on right side 04/01/2019   DOE (dyspnea on exertion) 03/25/2018   Cough variant asthma 03/24/2018   Diplopia 10/16/2015   SBO (small bowel obstruction) (HCC) 09/18/2013   Glaucoma 09/18/2013   GERD (gastroesophageal reflux disease) 09/18/2013   HLD (hyperlipidemia) 02/21/2013   Essential (primary) hypertension 02/21/2013   Barrett esophagus 02/19/2013   Cardiac conduction disorder  03/30/2012   Difficulty hearing 08/20/2011   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 12/05/2010   Allergic sinusitis 09/14/2010   Anxiety state 12/22/2009   Bipolar I disorder, single manic episode, in full remission 12/01/2009   Deficiency, 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   History of colon polyps 10/24/2008   Arthritis, degenerative 10/24/2008   PCP:  Mast, Man X, NP Pharmacy:  No Pharmacies Listed    Social Drivers of Health (SDOH) Social History: SDOH Screenings   Food Insecurity: No Food Insecurity (11/06/2023)  Housing: Low Risk  (11/06/2023)  Transportation Needs: No Transportation Needs (11/06/2023)  Utilities: Patient Unable To Answer (11/06/2023)  Depression (PHQ2-9): Low Risk  (10/02/2023)  Social Connections: Moderately Integrated (11/06/2023)  Tobacco Use: Low Risk  (11/06/2023)   SDOH Interventions:     Readmission Risk Interventions     No data to display

## 2023-11-08 DIAGNOSIS — F411 Generalized anxiety disorder: Secondary | ICD-10-CM | POA: Diagnosis not present

## 2023-11-08 DIAGNOSIS — K219 Gastro-esophageal reflux disease without esophagitis: Secondary | ICD-10-CM | POA: Diagnosis not present

## 2023-11-08 DIAGNOSIS — R531 Weakness: Secondary | ICD-10-CM | POA: Diagnosis not present

## 2023-11-08 DIAGNOSIS — I480 Paroxysmal atrial fibrillation: Secondary | ICD-10-CM | POA: Diagnosis not present

## 2023-11-08 DIAGNOSIS — F0390 Unspecified dementia without behavioral disturbance: Secondary | ICD-10-CM | POA: Diagnosis not present

## 2023-11-08 DIAGNOSIS — C49A3 Gastrointestinal stromal tumor of small intestine: Secondary | ICD-10-CM | POA: Diagnosis not present

## 2023-11-08 DIAGNOSIS — F304 Manic episode in full remission: Secondary | ICD-10-CM | POA: Diagnosis not present

## 2023-11-08 DIAGNOSIS — K56699 Other intestinal obstruction unspecified as to partial versus complete obstruction: Secondary | ICD-10-CM | POA: Diagnosis not present

## 2023-11-08 DIAGNOSIS — K922 Gastrointestinal hemorrhage, unspecified: Secondary | ICD-10-CM | POA: Diagnosis not present

## 2023-11-08 DIAGNOSIS — K920 Hematemesis: Secondary | ICD-10-CM | POA: Diagnosis not present

## 2023-11-08 DIAGNOSIS — I10 Essential (primary) hypertension: Secondary | ICD-10-CM | POA: Diagnosis not present

## 2023-11-08 DIAGNOSIS — B029 Zoster without complications: Secondary | ICD-10-CM | POA: Diagnosis not present

## 2023-11-08 DIAGNOSIS — E43 Unspecified severe protein-calorie malnutrition: Secondary | ICD-10-CM | POA: Diagnosis not present

## 2023-11-08 DIAGNOSIS — M6281 Muscle weakness (generalized): Secondary | ICD-10-CM | POA: Diagnosis not present

## 2023-11-08 DIAGNOSIS — G243 Spasmodic torticollis: Secondary | ICD-10-CM | POA: Diagnosis not present

## 2023-11-08 DIAGNOSIS — Z7401 Bed confinement status: Secondary | ICD-10-CM | POA: Diagnosis not present

## 2023-11-08 DIAGNOSIS — R911 Solitary pulmonary nodule: Secondary | ICD-10-CM | POA: Diagnosis not present

## 2023-11-08 DIAGNOSIS — K56609 Unspecified intestinal obstruction, unspecified as to partial versus complete obstruction: Secondary | ICD-10-CM | POA: Diagnosis not present

## 2023-11-08 LAB — CBC
HCT: 28.7 % — ABNORMAL LOW (ref 36.0–46.0)
Hemoglobin: 8.6 g/dL — ABNORMAL LOW (ref 12.0–15.0)
MCH: 31.6 pg (ref 26.0–34.0)
MCHC: 30 g/dL (ref 30.0–36.0)
MCV: 105.5 fL — ABNORMAL HIGH (ref 80.0–100.0)
Platelets: 246 K/uL (ref 150–400)
RBC: 2.72 MIL/uL — ABNORMAL LOW (ref 3.87–5.11)
RDW: 16 % — ABNORMAL HIGH (ref 11.5–15.5)
WBC: 7.4 K/uL (ref 4.0–10.5)
nRBC: 0 % (ref 0.0–0.2)

## 2023-11-08 MED ORDER — ALPRAZOLAM 0.5 MG PO TABS: 0.5000 mg | ORAL_TABLET | Freq: Three times a day (TID) | ORAL | 0 refills | Status: DC

## 2023-11-08 MED ORDER — PANTOPRAZOLE SODIUM 40 MG PO TBEC: 40.0000 mg | DELAYED_RELEASE_TABLET | Freq: Two times a day (BID) | ORAL | 0 refills | Status: DC

## 2023-11-08 NOTE — Plan of Care (Signed)

## 2023-11-08 NOTE — TOC Progression Note (Addendum)
 Transition of Care [facility]) - Progression Note    Patient Details  Name: Tracey [name] MRN: [id number] Date of Birth: Mar 03, 1940  Transition of Care [facility]) CM/SW Contact  Sonda Manuella Quill, RN Phone Number: 11/08/2023, 9:13 AM  Clinical Narrative:    Ins auth received; Plan Auth ID # [id number], Auth ID # [id number]; start date 11/07/23, end date 11/11/23; attempted to notify facility; spoke w/ Jeoffrey, RN at facilty; she is not sure where pt will admit;  LVMs for Izetta Ludwig, Admissions at [facility], and [place], Admissions at [facility]; awaiting return call.  [phone]- spoke w/ [place], Admissions [phone]) notified ins auth received; she said pt can admit to facility today, and d/c summary needed; Dr Austria notified via secure chat.  -[phone]- notified by Dr Austria pt ready for d/c to facility; awaiting d/c summary  Expected Discharge Plan: Skilled Nursing Facility Barriers to Discharge: Continued Medical Work up, English As A Second Language Teacher               Expected Discharge Plan and Services                                               Social Drivers of Health (SDOH) Interventions SDOH Screenings   Food Insecurity: No Food Insecurity (11/06/2023)  Housing: Low Risk  (11/06/2023)  Transportation Needs: No Transportation Needs (11/06/2023)  Utilities: Patient Unable To Answer (11/06/2023)  Depression (PHQ2-9): Low Risk  (10/02/2023)  Social Connections: Moderately Integrated (11/06/2023)  Tobacco Use: Low Risk  (11/06/2023)    Readmission Risk Interventions     No data to display

## 2023-11-08 NOTE — Discharge Summary (Signed)
 Physician Discharge Summary  Tracey [name] FMW:[id number] DOB: January 19, 1940 DOA: 11/05/2023  PCP: Mast, Man X, NP  Admit date: 11/05/2023 Discharge date: 11/08/2023  Admitted From: [facility] Disposition: [facility]  Recommendations for Outpatient Follow-up:  Follow up with PCP in 1-2 weeks Follow-up with [facility] gastroenterology in 2-3 weeks Continue to hold Eliquis and may resume on 11/14/2023 Discontinued losartan  Started on Protonix  40 mg p.o. twice daily for suspected gastritis causing episode of hematemesis Please obtain CBC in one week to recheck hemoglobin   Discharge Condition: Stable CODE STATUS: Full code Diet recommendation: Heart healthy diet  History of present illness:  Tracey [name] is a 83 y.o. female with past medical history significant for paroxysmal atrial fibrillation on Eliquis, bipolar disorder, cognitive impairment, history of SBO s/p ex lap with repair of small bowel enterotomy complicated by pelvic abscess s/p IV antibiotics, aortic atherosclerosis, recent outbreak of shingles currently on Valtrex who presented to [facility] ED on [facility]/05/[facility] with reports of coffee-ground emesis.  Staff also reports patient with poor oral intake.  Patient unable to participate in HPI given her underlying cognitive impairment/dementia.   In the ED, temperature 98.3 F, HR 69, RR 18, BP 104/63, SpO2 100% on room air.  WBC 13.3, hemoglobin 11.4, platelet count 282.  Sodium 138, potassium 3.8, chloride 110, CO2 19, glucose 124, BUN 36, creatinine 0.95.  Lipase 30.  AST 20, ALT 15, total bilirubin 0.3.  Urinalysis unrevealing.  Chest x-ray with no active cardiopulmonary disease process.  CT abdomen/pelvis with sliding-type hiatal hernia, postsurgical change in the colon without obstructive change, decrease in size of abdominal wound seroma anteriorly, small right sided pleural effusion.  TRH was consulted for further evaluation management of upper GI  bleed.  Hospital course:  Hematemesis concerning for upper GI bleed Patient presenting from SNF with reported episode of hematemesis x 1 observed by office staff.  Complicated by use of Eliquis outpatient for paroxysmal atrial fibrillation.  Also reported not eating/drinking well.  Hemoglobin 11.4 on admission.  Anemia panel with iron  48, TIBC 249, ferritin 233, folate 20.0, vitamin B12 646.  Hemoglobin 11.4 on admission, was monitored closely while inpatient and remained stable; 8.6 at time of discharge with a baseline Hgb 8-9, was 8.8 on 10/13.  Eliquis was held.  Was started on IV PPI twice daily.  Suspect etiology of her precipitous drop likely from hemoconcentration.  Patient also likely had some gastritis leading to episode of hematemesis.  Given stability of her hemoglobin, would avoid any aggressive procedures such as EGD and anesthesia given her comorbidities, advanced age.  Will start Protonix  40 mams p.o. twice daily.  Hold Eliquis until 11/14/2023 and then can resume.  Repeat CBC 1 week.  Outpatient follow-up with [facility] gastrology 2-3 weeks.   Shingles, isolated Patient recently diagnosed with shingles to left thorax which is isolated and not disseminated.  Started on Valtrex outpatient.  Completed 7-day course of Valtrex.   Paroxysmal atrial fibrillation on anticoagulation Amiodarone  100 mg p.o. daily, Holding Eliquis as above and may resume on 11/14/2023.   HTN Discontinued home losartan  as blood pressure remained stable off of this medication during hospitalization.   HLD Continue atorvastatin   Bipolar disorder Cognitive impairment/dementia Xanax  0.5 mg p.o. TID as prescribed, lithium  150 mg p.o. twice daily, Topamax  25 mg p.o. twice daily.   History of SBO s/p ex lap with repair of small bowel enterotomy complicated by pelvic abscess s/p antibiotics CT abdomen/pelvis notable  for postsurgical change in the colon without obstructive change, decreased size of abdominal wound  seroma anteriorly.     Discharge Diagnoses:  Principal Problem:   Coffee ground emesis Active Problems:   Altered mental status, unspecified   Paroxysmal atrial fibrillation (HCC)   Cognitive impairment   Bipolar disorder (HCC)   History of anemia   Shingles rash of the abdomen   UGIB (upper gastrointestinal bleed)    Discharge Instructions  Discharge Instructions     Call MD for:  difficulty breathing, headache or visual disturbances   Complete by: As directed    Call MD for:  extreme fatigue   Complete by: As directed    Call MD for:  persistant dizziness or light-headedness   Complete by: As directed    Call MD for:  persistant nausea and vomiting   Complete by: As directed    Call MD for:  severe uncontrolled pain   Complete by: As directed    Call MD for:  temperature >100.4   Complete by: As directed    Diet - low sodium heart healthy   Complete by: As directed    Increase activity slowly   Complete by: As directed       Allergies as of 11/08/2023       Reactions   Lisinopril Cough   Penicillins Itching, Other (See Comments)   50 years ago   Adhesive [tape] Itching   Atorvastatin Itching   Dilaudid  [hydromorphone  Hcl] Itching        Medication List     PAUSE taking these medications    apixaban 2.5 MG Tabs tablet Wait to take this until: November 14, 2023 Commonly known as: ELIQUIS Take 1 tablet (2.5 mg total) by mouth 2 (two) times daily.       STOP taking these medications    losartan  25 MG tablet Commonly known as: COZAAR    losartan  50 MG tablet Commonly known as: COZAAR    mirtazapine 7.5 MG tablet Commonly known as: REMERON   valACYclovir 1000 MG tablet Commonly known as: VALTREX       TAKE these medications    acetaminophen  325 MG tablet Commonly known as: TYLENOL  Take 650 mg by mouth every 4 (four) hours as needed (for pain).   ALPRAZolam  0.5 MG tablet Commonly known as: XANAX  Take 1 tablet (0.5 mg total) by mouth  3 (three) times daily. What changed:  when to take this reasons to take this Another medication with the same name was removed. Continue taking this medication, and follow the directions you see here.   amiodarone  100 MG tablet Commonly known as: PACERONE  Take 1 tablet (100 mg total) by mouth daily.   cycloSPORINE  0.05 % ophthalmic emulsion Commonly known as: RESTASIS  Place 1 drop into both eyes 2 (two) times daily.   lactose free nutrition Liqd Take 237 mLs by mouth in the morning, at noon, and at bedtime.   latanoprost  0.005 % ophthalmic solution Commonly known as: XALATAN  Place 1 drop into both eyes at bedtime.   lidocaine  5 % Commonly known as: LIDODERM  Place 1 patch onto the skin daily. Remove & Discard patch within 12 hours or as directed by MD   lithium  carbonate 150 MG capsule TAKE ONE CAPSULE BY MOUTH TWICE DAILY WITH MEALS   multivitamin with minerals Tabs tablet Take 1 tablet by mouth daily.   ondansetron  4 MG tablet Commonly known as: ZOFRAN  Take 4 mg by mouth every 6 (six) hours as needed for nausea.  pantoprazole  40 MG tablet Commonly known as: PROTONIX  Take 1 tablet (40 mg total) by mouth 2 (two) times daily.   polyethylene glycol 17 g packet Commonly known as: MIRALAX  / GLYCOLAX  Take 17 g by mouth every other day.   pravastatin  10 MG tablet Commonly known as: PRAVACHOL  Take 5 mg by mouth every evening.   Systane 0.4-0.3 % Soln Generic drug: Polyethyl Glycol-Propyl Glycol Place 1 drop into both eyes every 4 (four) hours as needed (for dryness).   Systane Nighttime Oint Place 1 Application into both eyes at bedtime.   Timolol  Maleate (Once-Daily) 0.5 % Soln Place 1 drop into both eyes daily.   topiramate  25 MG tablet Commonly known as: TOPAMAX  TAKE ONE TABLET BY MOUTH TWICE DAILY IN THE MORNING AND IN THE EVENING What changed: See the new instructions.        Follow-up Information     Mast, Man X, NP. Schedule an appointment as soon as  possible for a visit in 1 week(s).   Specialty: Internal Medicine Contact information: [address] [place] [address] [phone]         [facility]. Schedule an appointment as soon as possible for a visit in 2 week(s).   Specialty: Gastroenterology Contact information: [address] [place]  [address] [phone]               Allergies  Allergen Reactions   Lisinopril Cough   Penicillins Itching and Other (See Comments)    50 years ago   Adhesive [Tape] Itching   Atorvastatin Itching   Dilaudid  [Hydromorphone  Hcl] Itching    Consultations: None   Procedures/Studies: CT HEAD WO CONTRAST ( ) Result Date: 11/06/2023 EXAM: CT HEAD WITHOUT CONTRAST 11/06/2023 08:27:58 AM TECHNIQUE: CT of the head was performed without the administration of intravenous contrast. Automated exposure control, iterative reconstruction, and/or weight based adjustment of the mA/kV was utilized to reduce the radiation dose to as low as reasonably achievable. COMPARISON: Brain MRI 01/13/2020. Head CT 10/16/2021. CLINICAL HISTORY: Delirium. FINDINGS: BRAIN AND VENTRICLES: No acute hemorrhage. No evidence of acute infarct. No hydrocephalus. No extra-axial collection. No mass effect or midline shift. Volume remains within normal limits for age. Minimal chronic white matter hypodensity. No suspicious intracranial vascular hyperdensity. ORBITS: No acute abnormality. SINUSES: No acute abnormality. SOFT TISSUES AND SKULL: No acute soft tissue abnormality. No skull fracture. IMPRESSION: 1. Negative for age non-contrast head CT. Electronically signed by: Helayne Hurst MD 11/06/2023 08:36 AM EDT RP Workstation: [id number]   CT ABDOMEN PELVIS W CONTRAST Result Date: 11/06/2023 CLINICAL DATA:  Abdominal pain with cough and ground emesis EXAM: CT ABDOMEN AND PELVIS WITH CONTRAST TECHNIQUE: Multidetector CT imaging of the abdomen and pelvis was  performed using the standard protocol following bolus administration of intravenous contrast. RADIATION DOSE REDUCTION: This exam was performed according to the departmental dose-optimization program which includes automated exposure control, adjustment of the mA and/or kV according to patient size and/or use of iterative reconstruction technique. CONTRAST:  80mL OMNIPAQUE  IOHEXOL  300 MG/ML  SOLN COMPARISON:  10/24/2023 FINDINGS: Lower chest: Small right-sided pleural effusion is seen new from the prior exam. Sliding-type hiatal hernia is noted. Hepatobiliary: Scattered cysts are seen within the liver. The gallbladder is within normal limits. Pancreas: Unremarkable. No pancreatic ductal dilatation or surrounding inflammatory changes. Spleen: Normal in size without focal abnormality. Adrenals/Urinary Tract: Adrenal glands are well visualized and within normal limits. Kidneys demonstrate a normal enhancement pattern. Scattered cysts are noted within the left  kidney. No follow-up is recommended. The bladder is well distended. Previously seen air within the bladder has resolved. Stomach/Bowel: Changes consistent with prior ileocecectomy are again noted. Hiatal hernia is noted. Stomach is otherwise within normal limits. No small bowel abnormality is noted. Colon shows no obstructive changes. Vascular/Lymphatic: Aortic atherosclerosis. No enlarged abdominal or pelvic lymph nodes. Reproductive: Status post hysterectomy. No adnexal masses. Other: Previously seen pelvic fluid collection has resolved. Small seroma is noted along the anterior abdominal wound site measuring up to 15 mm again decreased in size prior exam. No new focal fluid collection is seen. Musculoskeletal: Degenerative changes of lumbar spine are noted. IMPRESSION: Sliding-type hiatal hernia. Postsurgical changes in the colon without obstructive change. Further decrease in size of abdominal wound seroma anteriorly. Small right-sided pleural effusion new from  the prior study. Electronically Signed   By: Oneil Devonshire M.D.   On: 11/06/2023 01:12   DG Chest Port 1 View Result Date: 11/06/2023 CLINICAL DATA:  Vomiting EXAM: PORTABLE CHEST 1 VIEW COMPARISON:  10/08/2023 FINDINGS: The heart size and mediastinal contours are within normal limits. Both lungs are clear. The visualized skeletal structures are unremarkable. IMPRESSION: No active disease. Electronically Signed   By: Luke Bun M.D.   On: 11/06/2023 00:02   CT ABDOMEN PELVIS W CONTRAST Result Date: 10/24/2023 CLINICAL DATA:  Small bowel obstruction and postoperative ileus EXAM: CT ABDOMEN AND PELVIS WITH CONTRAST TECHNIQUE: Multidetector CT imaging of the abdomen and pelvis was performed using the standard protocol following bolus administration of intravenous contrast. RADIATION DOSE REDUCTION: This exam was performed according to the departmental dose-optimization program which includes automated exposure control, adjustment of the mA and/or kV according to patient size and/or use of iterative reconstruction technique. CONTRAST:  OMNIPAQUE  IOHEXOL  300 MG/ML  SOLN COMPARISON:  CT abdomen and pelvis dated 10/16/2023 FINDINGS: Lower chest: No focal consolidation or pulmonary nodule in the lung bases. Decreased trace right pleural effusion. Resolution of left pleural effusion. Partially imaged heart size is normal. Hepatobiliary: Unchanged scattered subcentimeter hypodensities, too small to characterize. No intra or extrahepatic biliary ductal dilation. Normal gallbladder. Pancreas: No focal lesions or main ductal dilation. 8 mm hypodensity near the pancreatic head (2:25). No main pancreatic ductal dilation. Spleen: Normal in size without focal abnormality. Adrenals/Urinary Tract: No adrenal nodules. No suspicious renal mass, calculi or hydronephrosis. Left renal simple and minimally complicated cysts. Additional bilateral subcentimeter hypodensities, too small to characterize. Mildly distended urinary  bladder contains intraluminal gas. Stomach/Bowel: Small hiatal hernia. Normal appearance of the stomach. No evidence of bowel wall thickening, distention, or inflammatory changes. Enteric contrast material is seen within distal small bowel loops and reaches the level of the rectum. Appendix is not discretely seen. Vascular/Lymphatic: Aortic atherosclerosis. No enlarged abdominal or pelvic lymph nodes. Reproductive: No adnexal masses. Other: Pelvic fluid collection measures 2.7 x 0.7 cm (2:64), previously 6.4 x 1.9 cm. Previously noted irregular collections in the left hemiabdomen are no longer seen. No free air. Musculoskeletal: No acute or abnormal lytic or blastic osseous lesions. Multilevel degenerative changes of the partially imaged thoracic and lumbar spine. Postsurgical changes of the anterior abdominal wall. Increased size of lobulated densities along the surgical incision measuring up to 2.6 x 2.1 cm (2:55) at the inferior aspect. IMPRESSION: 1. No evidence of bowel obstruction. Enteric contrast material is seen within distal small bowel loops and reaches the level of the rectum. 2. Decreased size of pelvic fluid collection measuring 2.7 x 0.7 cm, previously 6.4 x 1.9 cm. Previously noted  irregular collections in the left hemiabdomen are no longer seen. 3. Increased size of lobulated densities along the surgical incision measuring up to 2.6 x 2.1 cm at the inferior aspect, which may represent hematomas or seromas. 4. Decreased trace right pleural effusion. Resolution of left pleural effusion. 5. Mildly distended urinary bladder contains intraluminal gas, which may be related to recent instrumentation. 6.  Aortic Atherosclerosis (ICD10-I70.0). Electronically Signed   By: Limin  Xu M.D.   On: 10/24/2023 18:59   DG Abd Portable 1V Result Date: 10/23/2023 CLINICAL DATA:  Nausea and vomiting EXAM: PORTABLE ABDOMEN - 1 VIEW COMPARISON:  CT abdomen and pelvis 10/16/2023 FINDINGS: Scattered gas and stool in the  colon. Residual contrast material in the right lower quadrant, likely in the cecum. No small or large bowel distention. No radiopaque stones. Degenerative changes in the lumbar spine and hips. Lumbar scoliosis convex towards the right. Surgical clips in the right pelvis. Skin staples along the midline consistent with recent surgery. No radiopaque stones. IMPRESSION: Nonobstructive bowel gas pattern.  Postoperative changes. Electronically Signed   By: Elsie Gravely M.D.   On: 10/23/2023 20:13   CT ABDOMEN PELVIS W CONTRAST Result Date: 10/16/2023 CLINICAL DATA:  Abdominal pain, post-op. EXAM: CT ABDOMEN AND PELVIS WITH CONTRAST TECHNIQUE: Multidetector CT imaging of the abdomen and pelvis was performed using the standard protocol following bolus administration of intravenous contrast. RADIATION DOSE REDUCTION: This exam was performed according to the departmental dose-optimization program which includes automated exposure control, adjustment of the mA and/or kV according to patient size and/or use of iterative reconstruction technique. CONTRAST:  80mL OMNIPAQUE  IOHEXOL  300 MG/ML  SOLN COMPARISON:  CT scan abdomen and pelvis from 10/13/2023. FINDINGS: Lower chest: There is trace left pleural effusion, slightly decreased since the prior study and small right pleural effusion, similar to the prior study. There are associated atelectatic changes in the visualized bilateral lung bases. Normal heart size. No pericardial effusion. Hepatobiliary: The liver is normal in size. Non-cirrhotic configuration. No suspicious mass. Redemonstration of at least 3 hypoattenuating subcentimeter sized lesions in the liver, which are too small to adequately characterize but favored to represent cysts. There is a 5 mm hyperattenuating focus in the right hepatic dome (series 2, image 9), incompletely characterized on the current exam but favored to represent a flash filling hemangioma. No intrahepatic or extrahepatic bile duct  dilation. Vicarious excretion of contrast noted within the gallbladder. No calcified gallstones. Normal gallbladder wall thickness. No pericholecystic inflammatory changes. Pancreas: Unremarkable. No pancreatic ductal dilatation or surrounding inflammatory changes. Spleen: Within normal limits. No focal lesion. Adrenals/Urinary Tract: Adrenal glands are unremarkable. There are several bilateral hypoattenuating structures, which are too small to adequately characterize. However, the largest structure in the left kidney interpolar region, anteriorly measuring up to 1.1 x 1.1 cm, can be characterized as a simple cyst. No nephroureterolithiasis or obstructive uropathy. Sinus cyst noted in the left kidney upper pole. Urinary bladder is decompressed secondary to Foley catheter. Stomach/Bowel: There is at least small sliding hiatal hernia noted. Enteric tube is noted with its tip in the proximal stomach body region. No disproportionate dilation of the small or large bowel loops. No evidence of abnormal bowel wall thickening or inflammatory changes. The appendix was not visualized; however there is no acute inflammatory process in the right lower quadrant. Vascular/Lymphatic: There is mild interval decrease in the mild irregular hyperattenuating walled collection in the dependent pelvis currently measuring 3.1 x 6.6 cm (series 4, image 64). This may represent postoperative seroma  or developing abscess. Correlate clinically. There are also multiple additional areas of fat stranding and very small noncommunicating mild irregular hyperattenuating walled collections in the left midabdomen abutting the descending colon which also exhibit mild-to-moderate irregular wall thickening at this level. (Series 2, image 39 and series 4, image 49). This is nonspecific. Differential diagnosis includes postsurgical changes, small developing abscesses, tumor deposits, etc. No pneumoperitoneum. No abdominal or pelvic lymphadenopathy, by size  criteria. No aneurysmal dilation of the major abdominal arteries. There are mild peripheral atherosclerotic vascular calcifications of the aorta and its major branches. Reproductive: The uterus is surgically absent. No large adnexal mass. Other: Midline surgical scar noted. No anterior abdominal wall abscess/collection or hematoma. Anterior skin staples noted. The soft tissues and abdominal wall are otherwise unremarkable. Musculoskeletal: No suspicious osseous lesions. There are mild - moderate multilevel degenerative changes in the visualized spine. IMPRESSION: 1. Interval resolution of previously seen bowel obstruction. 2. There is mild interval decrease in the mild irregular hyperattenuating walled collection in the dependent pelvis currently measuring 3.1 x 6.6 cm. This may represent postoperative seroma or developing abscess. 3. There are also multiple additional areas of fat stranding and very small noncommunicating mild irregular hyperattenuating walled collections in the left midabdomen abutting the descending colon which also exhibit mild-to-moderate irregular wall thickening at this level. This is nonspecific. Differential diagnosis includes postsurgical changes, small developing abscesses, tumor deposits, etc. 4. Multiple other nonacute observations, as described above. Aortic Atherosclerosis (ICD10-I70.0). Electronically Signed   By: Ree Molt M.D.   On: 10/16/2023 14:45   CT ABDOMEN PELVIS W CONTRAST Result Date: 10/13/2023 EXAM: CT ABDOMEN AND PELVIS WITH CONTRAST 10/13/2023 12:06:33 PM TECHNIQUE: CT of the abdomen and pelvis was performed with the administration of 100 mL of iohexol  (OMNIPAQUE ) 300 MG/ML solution. Multiplanar reformatted images are provided for review. Automated exposure control, iterative reconstruction, and/or weight-based adjustment of the mA/kV was utilized to reduce the radiation dose to as low as reasonably achievable. COMPARISON: 10/05/2023 CLINICAL HISTORY: Abdominal  pain, post-op. Evaluate progress of high-grade small bowel obstruction with transition point in the pelvis to the right of the midline and mild mesenteric edema. FINDINGS: LOWER CHEST: Slight increase in small/moderate effusions right greater than left. Dependent atelectasis posteriorly in the lung bases. Moderate hiatal hernia is partially visualized. LIVER: Stable probable small hepatic cysts in right hepatic lobe. No new lesion. GALLBLADDER AND BILE DUCTS: Gallbladder is physiologically distended. No biliary ductal dilatation. SPLEEN: No acute abnormality. PANCREAS: No acute abnormality. ADRENAL GLANDS: No acute abnormality. KIDNEYS, URETERS AND BLADDER: Multiple cortical lesions in kidneys, some of which can be characterized as simple cysts, largest 12 mm mid left kidney. Per consensus, no follow-up is needed for simple Bosniak type 1 and 2 renal cysts, unless the patient has a malignancy history or risk factors. No stones in the kidneys or ureters. No hydronephrosis. No perinephric or periureteral stranding. The urinary bladder is distended. GI AND BOWEL: Gastric tube extends into the stomach, which is partially distended by gas and contrast material. Multiple dilated proximal and mid small bowel loops with transition point in the right lower quadrant. Decompressed loops of distal small bowel. There is physiologic distention of the right colon. The descending and sigmoid segments of the colon are decompressed with some mild regional inflammatory/edematous changes, new since previous, and regional scattered peripheral enhancing small fluid collections. PERITONEUM AND RETROPERITONEUM: No ascites. No free air. Scattered peripheral enhancing small fluid collections are noted, particularly in the region of the descending and sigmoid colon.  VASCULATURE: Aorta is normal in caliber. Mild scattered iliac calcified plaque without aneurysm. LYMPH NODES: No lymphadenopathy. REPRODUCTIVE ORGANS: No acute abnormality. BONES  AND SOFT TISSUES: Midline skin staples below the umbilicus. Surgical clips in the right pelvis as before. Degenerative disc disease L5-S1. No acute osseous abnormality. No focal soft tissue abnormality. IMPRESSION: 1. Persistent small bowel obstruction with transition point in the right lower quadrant. 2. descending and sigmoid colon decompressed with new mild regional inflammatory/edematous changes and scattered peripheral enhancing small fluid collections since prior, suggesting colitis. 3. Small to moderate bilateral pleural effusions, right greater than left, with dependent basilar atelectasis. Electronically signed by: Katheleen Faes MD 10/13/2023 04:05 PM EDT RP Workstation: [id number]     Subjective: Patient seen examined at bedside, lying in bed.  Asking to go home.  No family present at bedside.  No other questions or concerns at this time.  Remains pleasantly confused.  Denies headache, no chest pain, no shortness of breath, no abdominal pain.  No acute events overnight per nursing staff.  Discharge Exam: Vitals:   11/07/23 2005 11/08/23 0420  BP: 125/65 122/68  Pulse: (!) 54 62  Resp: 18 18  Temp: 97.8 F (36.6 C) 98.1 F (36.7 C)  SpO2: 100% 100%   Vitals:   11/07/23 0843 11/07/23 1200 11/07/23 2005 11/08/23 0420  BP: 111/78 122/63 125/65 122/68  Pulse: (!) 56  (!) 54 62  Resp: 18 16 18 18   Temp: 97.6 F (36.4 C) 97.6 F (36.4 C) 97.8 F (36.6 C) 98.1 F (36.7 C)  TempSrc: Oral Oral Oral Oral  SpO2: 100% 100% 100% 100%  Height:        Physical Exam: GEN: NAD, alert, pleasantly confused, chronically ill in appearance HEENT: NCAT, PERRL, EOMI, sclera clear, dry mucous membrane PULM: CTAB w/o wheezes/crackles, normal respiratory effort, on room air with SpO2 100% at rest CV: RRR w/o M/G/R GI: abd soft, NTND, + BS; noted midline abdominal surgical incision/scar well-approximated without any concerning findings MSK: no peripheral edema, moves all extremities  independently NEURO: No focal neurological deficit besides cognition PSYCH: normal mood/affect Integumentary: Vesicular rash noted to left thorax in a typical dermatomal pattern consistent with shingles, midline surgical incision scar well-healed/approximated without any surrounding fluctuance/erythema; no other concerning rashes/lesions/wounds noted on exposed skin surfaces    The results of significant diagnostics from this hospitalization (including imaging, microbiology, ancillary and laboratory) are listed below for reference.     Microbiology: No results found for this or any previous visit (from the past 240 hours).   Labs: BNP (last 3 results) Recent Labs    08/06/23 1609  BNP 67.4   Basic Metabolic Panel: Recent Labs  Lab 11/05/23 2240 11/07/23 0408  NA 138 147*  K 3.8 3.8  CL 110 121*  CO2 19* 20*  GLUCOSE 124* 109*  BUN 36* 18  CREATININE 0.95 0.83  CALCIUM 11.6* 11.0*   Liver Function Tests: Recent Labs  Lab 11/05/23 2240  AST 20  ALT 15  ALKPHOS 86  BILITOT 0.3  PROT 6.1*  ALBUMIN  3.5   Recent Labs  Lab 11/05/23 2240  LIPASE 30   No results for input(s): AMMONIA in the last 168 hours. CBC: Recent Labs  Lab 11/05/23 2240 11/06/23 0606 11/06/23 2002 11/06/23 2157 11/07/23 0408 11/08/23 0429  WBC 13.3* 8.9  --   --  7.4 7.4  HGB 11.4* 9.3* 8.1* 9.5* 8.6* 8.6*  HCT 37.0 29.4* 27.1* 30.4* 27.4* 28.7*  MCV 103.4* 103.2*  --   --  105.0* 105.5*  PLT 282 248  --   --  236 246   Cardiac Enzymes: No results for input(s): CKTOTAL, CKMB, CKMBINDEX, TROPONINI in the last 168 hours. BNP: Invalid input(s): POCBNP CBG: No results for input(s): GLUCAP in the last 168 hours. D-Dimer No results for input(s): DDIMER in the last 72 hours. Hgb A1c No results for input(s): HGBA1C in the last 72 hours. Lipid Profile No results for input(s): CHOL, HDL, LDLCALC, TRIG, CHOLHDL, LDLDIRECT in the last 72 hours. Thyroid   function studies No results for input(s): TSH, T4TOTAL, T3FREE, THYROIDAB in the last 72 hours.  Invalid input(s): FREET3 Anemia work up Recent Labs    11/06/23 2002 11/06/23 2157  VITAMINB12  --  646  FOLATE  --  20.0  FERRITIN  --  233  TIBC  --  249*  IRON   --  48  RETICCTPCT 1.8  --    Urinalysis    Component Value Date/Time   COLORURINE YELLOW 11/06/2023 0129   APPEARANCEUR CLEAR 11/06/2023 0129   LABSPEC 1.012 11/06/2023 0129   PHURINE 6.0 11/06/2023 0129   GLUCOSEU NEGATIVE 11/06/2023 0129   HGBUR NEGATIVE 11/06/2023 0129   BILIRUBINUR NEGATIVE 11/06/2023 0129   KETONESUR NEGATIVE 11/06/2023 0129   PROTEINUR 30 (A) 11/06/2023 0129   NITRITE NEGATIVE 11/06/2023 0129   LEUKOCYTESUR NEGATIVE 11/06/2023 0129   Sepsis Labs Recent Labs  Lab 11/05/23 2240 11/06/23 0606 11/07/23 0408 11/08/23 0429  WBC 13.3* 8.9 7.4 7.4   Microbiology No results found for this or any previous visit (from the past 240 hours).   Time coordinating discharge: Over 30 minutes  SIGNED:   Camellia PARAS Micael Barb, DO  [facility] 11/08/2023, 10:22 AM

## 2023-11-08 NOTE — Progress Notes (Signed)
 Mobility Specialist - Progress Note   11/08/23 1000  Mobility  Activity Pivoted/transferred from bed to chair  Level of Assistance Minimal assist, patient does 75% or more  Assistive Device Front wheel walker  Range of Motion/Exercises Active  Activity Response Tolerated well  Mobility Referral Yes  Mobility visit 1 Mobility  Mobility Specialist Start Time (ACUTE ONLY) 0945  Mobility Specialist Stop Time (ACUTE ONLY) 0955  Mobility Specialist Time Calculation (min) (ACUTE ONLY) 10 min   Pt was found in bed and agreeable to mobilize. NT in room for safety. Min-A for bed mobility and transfer. At EOS was left on recliner chair with all needs met. Call bell in reach and NT in room.   Erminio Leos,  Mobility Specialist Can be reached via Secure Chat

## 2023-11-08 NOTE — TOC Transition Note (Signed)
 Transition of Care [facility]) - Discharge Note   Patient Details  Name: Tracey [name] MRN: [id number] Date of Birth: [birth year]  Transition [facility] CM/SW Contact:  Sonda Manuella Quill, RN Phone Number: 11/08/2023, 10:31 AM   Clinical Narrative:    D/C orders received; pt will admit to [facility]; LVM for Mia, On Call for facility [phone]); awaiting return call; transort by PT[facility]; pt's son Ishita Mcnerney [phone]) notified, and agreed to d/c plan; will arrange transport when return call received from facility.  1035- return call from [facility], she gave RM # [id number], [employer], [facility] [place], call report # [phone], ext [phone] (main #), or ext [phone] (mobile #), POC Yasmin Holland; address: [address], [address] [place] [phone]; PTAR called for transport at 1044; spoke w/  Operator # [id number]; no IP CM needs.   Final next level of care: Skilled Nursing Facility Barriers to Discharge: No Barriers Identified   Patient Goals and CMS Choice Patient states their goals for this hospitalization and ongoing recovery are:: sNF          Discharge Placement              Patient chooses bed at: [facility] [place] Patient to be transferred to facility by: PTAR Name of family member notified: Addisyn Leclaire (son) [phone] Patient and family notified of of transfer: 11/08/23  Discharge Plan and Services Additional resources added to the After Visit Summary for                  DME Arranged: N/A DME Agency: NA       HH Arranged: NA HH Agency: NA        Social Drivers of Health (SDOH) Interventions SDOH Screenings   Food Insecurity: No Food Insecurity (11/06/2023)  Housing: Low Risk  (11/06/2023)  Transportation Needs: No Transportation Needs (11/06/2023)  Utilities: Patient Unable To Answer (11/06/2023)  Depression (PHQ2-9): Low Risk  (10/02/2023)  Social Connections: Moderately Integrated (11/06/2023)  Tobacco Use: Low Risk  (11/06/2023)      Readmission Risk Interventions     No data to display

## 2023-11-08 NOTE — Plan of Care (Signed)
  Problem: Education: Goal: Knowledge of General Education information will improve Description: Including pain rating scale, medication(s)/side effects and non-pharmacologic comfort measures Outcome: Progressing   Problem: Clinical Measurements: Goal: Diagnostic test results will improve Outcome: Progressing   Problem: Safety: Goal: Ability to remain free from injury will improve Outcome: Progressing   Problem: Education: Goal: Knowledge of General Education information will improve Description: Including pain rating scale, medication(s)/side effects and non-pharmacologic comfort measures Outcome: Progressing   Problem: Clinical Measurements: Goal: Will remain free from infection Outcome: Progressing Goal: Diagnostic test results will improve Outcome: Progressing   Problem: Safety: Goal: Ability to remain free from injury will improve Outcome: Progressing

## 2023-11-10 ENCOUNTER — Non-Acute Institutional Stay (SKILLED_NURSING_FACILITY): Payer: Self-pay | Admitting: Sports Medicine

## 2023-11-10 DIAGNOSIS — K922 Gastrointestinal hemorrhage, unspecified: Secondary | ICD-10-CM

## 2023-11-10 DIAGNOSIS — F304 Manic episode in full remission: Secondary | ICD-10-CM

## 2023-11-10 DIAGNOSIS — K219 Gastro-esophageal reflux disease without esophagitis: Secondary | ICD-10-CM

## 2023-11-10 DIAGNOSIS — F0390 Unspecified dementia without behavioral disturbance: Secondary | ICD-10-CM | POA: Diagnosis not present

## 2023-11-10 DIAGNOSIS — F411 Generalized anxiety disorder: Secondary | ICD-10-CM

## 2023-11-10 DIAGNOSIS — I480 Paroxysmal atrial fibrillation: Secondary | ICD-10-CM | POA: Diagnosis not present

## 2023-11-10 NOTE — Progress Notes (Unsigned)
 Careteam: Patient Care Team: Mast, Man X, NP as PCP - General (Internal Medicine) Devonna Sieving, MD as Consulting Physician (Gynecologic Oncology) Cloretta Arley NOVAK, MD as Consulting Physician (Oncology) Luis Purchase, MD as Consulting Physician (Gastroenterology) Octavia Charleston, MD as Consulting Physician (Ophthalmology) Cleotilde Fairy POUR, MD as Referring Physician (Neurology) Tat, Asberry RAMAN, DO as Consulting Physician (Neurology)  PLACE OF SERVICE:  [facility]  Advanced Directive information    Allergies  Allergen Reactions   Lisinopril Cough   Penicillins Itching and Other (See Comments)    50 years ago   Adhesive [Tape] Itching   Atorvastatin Itching   Dilaudid  [Hydromorphone  Hcl] Itching    No chief complaint on file.    Discussed the use of AI scribe software for clinical note transcription with the patient, who gave verbal consent to proceed.  History of Present Illness      Review of Systems:  ROS Negative unless indicated in HPI.   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, possible    Tremors possible parkinsons   Manic disorder, single episode, in full remission 12/01/2009   Sixth nerve palsy    Past Surgical History:  Procedure Laterality Date   BILATERAL SALPINGOOPHORECTOMY  09/22/2007   CATARACT EXTRACTION Bilateral    ESOPHAGOGASTRODUODENOSCOPY (EGD) WITH PROPOFOL  N/A 03/25/2022    Procedure: ESOPHAGOGASTRODUODENOSCOPY (EGD) WITH PROPOFOL ;  Surgeon: Legrand Victory LITTIE DOUGLAS, MD;  Location: [facility];  Service: Gastroenterology;  Laterality: N/A;   Gastrointestinal Stroma Tumor,  Other  1960   GIST Surgery   ILEOCECETOMY  09/22/2007   LAPAROSCOPIC LYSIS OF ADHESIONS N/A 10/07/2023   Procedure: LYSIS, ADHESIONS, LAPAROSCOPIC;  Surgeon: Dasie Leonor LITTIE, MD;  Location: [facility];  Service: General;  Laterality: N/A;   LAPAROSCOPY N/A 10/07/2023   Procedure: DIAGNOSTIC LAPAROSCOPY, EXPLORATORY LAPAROTOMY WITH LYSIS OF ADHESIONS, REPAIR OF SMALL BOWEL ENTEROTOMY;  Surgeon: Dasie Leonor LITTIE, MD;  Location: [facility];  Service: General;  Laterality: N/A;   OVARIAN CYST REMOVAL Right 1967   SMALL INTESTINE SURGERY  09/22/2007   TOTAL KNEE ARTHROPLASTY Right 10/05/2019   Procedure: RIGHT TOTAL KNEE ARTHROPLASTY;  Surgeon: Addie Cordella Hamilton, MD;  Location: [facility];  Service: Orthopedics;  Laterality: Right;   TOTAL VAGINAL HYSTERECTOMY  1986   Fibroids   Social History:   reports that she has never smoked. She has never been exposed to tobacco smoke. She has never used smokeless tobacco. She reports that she does not drink alcohol  and does not use drugs.  Family History  Problem Relation Age of Onset   Congestive Heart Failure Mother    Dementia Mother    Heart disease Father    Diabetes Father    Lung cancer Son    Liver disease Neg Hx    Esophageal cancer Neg Hx    Colon cancer Neg Hx     Medications: Patient's Medications  New  Prescriptions   No medications on file  Previous Medications   ACETAMINOPHEN  (TYLENOL ) 325 MG TABLET    Take 650 mg by mouth every 4 (four) hours as needed (for pain).   ALPRAZOLAM  (XANAX ) 0.5 MG TABLET    Take 1 tablet (0.5 mg total) by mouth 3 (three) times daily.   AMIODARONE  (PACERONE ) 100 MG TABLET    Take 1 tablet (100 mg total) by mouth daily.   APIXABAN (ELIQUIS) 2.5 MG TABS TABLET    Take 1 tablet (2.5 mg total) by mouth 2 (two) times daily.    CYCLOSPORINE  (RESTASIS ) 0.05 % OPHTHALMIC EMULSION    Place 1 drop into both eyes 2 (two) times daily.   LACTOSE FREE NUTRITION (BOOST) LIQD    Take 237 mLs by mouth in the morning, at noon, and at bedtime.   LATANOPROST  (XALATAN ) 0.005 % OPHTHALMIC SOLUTION    Place 1 drop into both eyes at bedtime.   LIDOCAINE  (LIDODERM ) 5 %    Place 1 patch onto the skin daily. Remove & Discard patch within 12 hours or as directed by MD   LITHIUM  CARBONATE 150 MG CAPSULE    TAKE ONE CAPSULE BY MOUTH TWICE DAILY WITH MEALS   MULTIPLE VITAMIN (MULTIVITAMIN WITH MINERALS) TABS TABLET    Take 1 tablet by mouth daily.   ONDANSETRON  (ZOFRAN ) 4 MG TABLET    Take 4 mg by mouth every 6 (six) hours as needed for nausea.   PANTOPRAZOLE  (PROTONIX ) 40 MG TABLET    Take 1 tablet (40 mg total) by mouth 2 (two) times daily.   POLYETHYLENE GLYCOL (MIRALAX  / GLYCOLAX ) 17 G PACKET    Take 17 g by mouth every other day.   PRAVASTATIN  (PRAVACHOL ) 10 MG TABLET    Take 5 mg by mouth every evening.   SYSTANE 0.4-0.3 % SOLN    Place 1 drop into both eyes every 4 (four) hours as needed (for dryness).   TIMOLOL  MALEATE, ONCE-DAILY, 0.5 % SOLN    Place 1 drop into both eyes daily.   TOPIRAMATE  (TOPAMAX ) 25 MG TABLET    TAKE ONE TABLET BY MOUTH TWICE DAILY IN THE MORNING AND IN THE EVENING   WHITE PETROLATUM-MINERAL OIL (SYSTANE NIGHTTIME) OINT    Place 1 Application into both eyes at bedtime.  Modified Medications   No medications on file  Discontinued Medications   No medications on file    Physical Exam: There were no vitals filed for this visit. There is no height or weight on file to calculate BMI. BP Readings from Last 3 Encounters:  11/08/23 122/68  11/03/23 102/64  10/30/23 130/75   Wt Readings from Last 3 Encounters:  11/03/23 108 lb 1.6 oz (49 kg)  10/30/23 108 lb 1.6 oz (49 kg)  10/27/23 111 lb 12.4 oz (50.7 kg)    Physical Exam  Labs reviewed: Basic Metabolic Panel: Recent Labs    07/29/23 0711  08/06/23 1609 10/02/23 0546 10/03/23 0443 10/05/23 1457 10/06/23 0300 10/22/23 0435 10/23/23 0106 10/25/23 0312 11/05/23 2240 11/07/23 0408  NA  --    < > 137   < >  --    < > 138 134* 141 138 147*  K  --    < > 4.1   < >  --    < > 4.2 4.2 4.2 3.8 3.8  CL  --    < > 99   < >  --    < > 112* 108 109 110 121*  CO2  --    < > 23   < >  --    < > 18* 17* 25 19* 20*  GLUCOSE  --    < > 153*   < >  --    < > 111* 117* 120* 124* 109*  BUN  --    < > 39*   < >  --    < > 38* 39* 29* 36* 18  CREATININE  --    < > 0.95   < >  --    < > 0.64 0.59 0.62 0.95 0.83  CALCIUM  --    < > 10.6*   < >  --    < > 10.0 10.1 10.4* 11.6* 11.0*  MG  --   --  2.1   < >  --    < > 2.2 2.1 2.5*  --   --   PHOS  --   --   --    < >  --    < > 2.5 2.9 3.2  --   --   TSH 1.56  --  0.509  --  1.150  --   --   --   --   --   --    < > = values in this interval not displayed.   Liver Function Tests: Recent Labs    10/20/23 0308 10/23/23 0106 11/05/23 2240  AST 13* 17 20  ALT 9 11 15   ALKPHOS 100 93 86  BILITOT 0.2 0.2 0.3  PROT 4.8* 5.8* 6.1*  ALBUMIN  2.6* 3.2* 3.5   Recent Labs    11/05/23 2240  LIPASE 30   No results for input(s): AMMONIA in the last 8760 hours. CBC: Recent Labs    10/25/23 0312 10/26/23 0312 10/27/23 0247 11/05/23 2240 11/06/23 0606 11/06/23 2002 11/06/23 2157 11/07/23 0408 11/08/23 0429  WBC 6.5 6.8 6.8   < > 8.9  --   --  7.4 7.4  NEUTROABS 3.9 4.2 4.7  --   --   --   --   --   --   HGB 8.5* 9.2* 8.8*   < > 9.3*   < > 9.5* 8.6* 8.6*  HCT 27.4* 29.9* 28.8*   < > 29.4*   < > 30.4* 27.4* 28.7*  MCV 103.4* 103.1* 103.6*   < > 103.2*  --   --  105.0* 105.5*  PLT 367 356 318   < > 248  --   --  236 246   < > = values in this interval not displayed.   Lipid Panel: Recent Labs    07/29/23 0711 10/13/23 0420 10/20/23 0308  CHOL 212*  --   --   HDL 62  --   --   LDLCALC 878*  --   --   TRIG 173* 199* 65  CHOLHDL 3.4  --   --    TSH: Recent Labs     07/29/23 0711 10/02/23 0546 10/05/23 1457  TSH 1.56 0.509 1.150   A1C: No results found for: HGBA1C  Assessment and Plan Assessment & Plan       No follow-ups on file.:   Tracey [name]

## 2023-11-12 ENCOUNTER — Encounter: Payer: Self-pay | Admitting: Sports Medicine

## 2023-11-18 LAB — CBC AND DIFFERENTIAL
HCT: 34 — AB (ref 36–46)
Hemoglobin: 11.2 — AB (ref 12.0–16.0)
Neutrophils Absolute: 5459
Platelets: 351 K/uL (ref 150–400)
WBC: 7.7

## 2023-11-18 LAB — CBC: RBC: 3.39 — AB (ref 3.87–5.11)

## 2023-11-24 ENCOUNTER — Other Ambulatory Visit: Payer: Self-pay | Admitting: Nurse Practitioner

## 2023-11-24 MED ORDER — ALPRAZOLAM 0.5 MG PO TABS: 0.5000 mg | ORAL_TABLET | Freq: Three times a day (TID) | ORAL | 0 refills | Status: DC

## 2023-11-25 DIAGNOSIS — R41841 Cognitive communication deficit: Secondary | ICD-10-CM | POA: Diagnosis not present

## 2023-11-25 DIAGNOSIS — R29898 Other symptoms and signs involving the musculoskeletal system: Secondary | ICD-10-CM | POA: Diagnosis not present

## 2023-11-25 DIAGNOSIS — M542 Cervicalgia: Secondary | ICD-10-CM | POA: Diagnosis not present

## 2023-11-26 DIAGNOSIS — R29898 Other symptoms and signs involving the musculoskeletal system: Secondary | ICD-10-CM | POA: Diagnosis not present

## 2023-11-26 DIAGNOSIS — R41841 Cognitive communication deficit: Secondary | ICD-10-CM | POA: Diagnosis not present

## 2023-11-26 DIAGNOSIS — M542 Cervicalgia: Secondary | ICD-10-CM | POA: Diagnosis not present

## 2023-11-28 ENCOUNTER — Encounter: Payer: Self-pay | Admitting: Nurse Practitioner

## 2023-11-28 ENCOUNTER — Non-Acute Institutional Stay (SKILLED_NURSING_FACILITY): Payer: Self-pay | Admitting: Nurse Practitioner

## 2023-11-28 DIAGNOSIS — G243 Spasmodic torticollis: Secondary | ICD-10-CM

## 2023-11-28 DIAGNOSIS — R29898 Other symptoms and signs involving the musculoskeletal system: Secondary | ICD-10-CM | POA: Diagnosis not present

## 2023-11-28 DIAGNOSIS — D509 Iron deficiency anemia, unspecified: Secondary | ICD-10-CM

## 2023-11-28 DIAGNOSIS — R609 Edema, unspecified: Secondary | ICD-10-CM

## 2023-11-28 DIAGNOSIS — C49A3 Gastrointestinal stromal tumor of small intestine: Secondary | ICD-10-CM | POA: Diagnosis not present

## 2023-11-28 DIAGNOSIS — I10 Essential (primary) hypertension: Secondary | ICD-10-CM | POA: Diagnosis not present

## 2023-11-28 DIAGNOSIS — M542 Cervicalgia: Secondary | ICD-10-CM | POA: Diagnosis not present

## 2023-11-28 DIAGNOSIS — F319 Bipolar disorder, unspecified: Secondary | ICD-10-CM

## 2023-11-28 DIAGNOSIS — R41841 Cognitive communication deficit: Secondary | ICD-10-CM | POA: Diagnosis not present

## 2023-11-28 DIAGNOSIS — I480 Paroxysmal atrial fibrillation: Secondary | ICD-10-CM | POA: Diagnosis not present

## 2023-11-28 DIAGNOSIS — R001 Bradycardia, unspecified: Secondary | ICD-10-CM

## 2023-11-28 DIAGNOSIS — K219 Gastro-esophageal reflux disease without esophagitis: Secondary | ICD-10-CM | POA: Diagnosis not present

## 2023-11-28 MED ORDER — HYDROCODONE-ACETAMINOPHEN 5-325 MG PO TABS: 0.5000 | ORAL_TABLET | Freq: Two times a day (BID) | ORAL | 0 refills | Status: AC

## 2023-11-28 NOTE — Assessment & Plan Note (Signed)
 GERD/Hx of UGIB, stable, on pantoprazole 

## 2023-11-28 NOTE — Assessment & Plan Note (Signed)
 Cervical degenerative changes/dystonia, better, CT/MRI 12/2019 ruled out acute process, aches in neck R>L travels to the back of head, comes and goes, not disabling, f/u Neurosurgery. Failed Cymbalta , Gabapentin , Robaxin , Ultracet . s/p Botulinum Toxin inj by Neurology. ESR 9, CRP 2.7 10/09/20(ESR 6 07/30/22). The patient stated Alprazolam  and Tylenol  are kind of effective. Improved on Topamax , but associated with weight loss.  No Parkinson's disease, took her off Sinemet  per Neurology. Currently taking inj-declined.  Declined neurology f/u Will X-ray cervical spine, try Norco 5/325mg  1/2 tab bid hold for intolerance or AR

## 2023-11-28 NOTE — Assessment & Plan Note (Signed)
 Bradycardia, improved from 40s to 50s. Hx of normalized HR after Tylenol  PM dc'd, GDR of Lithium  helped too. BNP 67.4 08/06/23

## 2023-11-28 NOTE — Assessment & Plan Note (Signed)
 Heart rate is in control New onset of A-fib, follow-up cardiology, on amiodarone , resumed Eliquis 10/31/25after UGIB resolved.

## 2023-11-28 NOTE — Progress Notes (Signed)
 Location:   SNF [facility] Room Number: 60B Place of Service:  SNF (31) Provider: Larwance Palak Tercero NP  Kimani Bedoya X, NP  Patient Care Team: Starling Christofferson X, NP as PCP - General (Internal Medicine) Devonna Sieving, MD as Consulting Physician (Gynecologic Oncology) Cloretta Arley NOVAK, MD as Consulting Physician (Oncology) Luis Purchase, MD as Consulting Physician (Gastroenterology) Octavia Charleston, MD as Consulting Physician (Ophthalmology) Cleotilde Fairy POUR, MD as Referring Physician (Neurology) Tat, Asberry RAMAN, DO as Consulting Physician (Neurology)  Extended Emergency Contact Information Primary Emergency Contact: Corlis Sieving Mulders Address: [address]          [place], [place] [address] [place] Home Phone: [phone] Mobile Phone: [phone] Relation: Son Secondary Emergency Contact: Corlis Comer Numbers Home Phone: [phone] Mobile Phone: [phone] Relation: Relative  Code Status:  DNR Goals of care: Advanced Directive information    11/05/2023   10:27 PM  Advanced Directives  Does Patient Have a Medical Advance Directive? Yes  Type of Advance Directive Healthcare Power of Attorney  Does patient want to make changes to medical advance directive? No - Patient declined  Copy of Healthcare Power of Attorney in Chart? No - copy requested     Chief Complaint  Patient presents with   Medical Management of Chronic Issues    HPI:  Pt is a 83 y.o. female seen today for medical management of chronic diseases.    Hospitalized 11/05/23-11/08/23 AMS, Afib, UGIB, shingles of abd.   UGIB-HPOA declined GI f/u   Hospitalized 10/01/2023 to 10/27/2023 for small bowel obstruction, status post laparoscopy, exploratory laparotomy with lysis of adhesions, repair of the small bowel enterotomy 10/07/2023.  Her hospital stay was complicated with with post operative ileus, postop anemia, pelvic abscess-completed antibiotics.  Hx of GIST of small intestine s/p  resection. MiraLax , Psyllium              Post op anemia, received IV iron  in the hospital, Iron  49, Vit B12 931 10/20/23, Hgb 7.8 10/21/23, Hgb 11.2 11/18/23             Pelvic abscess completed ABD in hospital, noted on CT 10/13/23, cleared by surgeon upon discharge.              New onset of A-fib, follow-up cardiology, on amiodarone , resumed Eliquis 10/31/25after UGIB resolved.              Pulmonary nodule, 7 mm posterior right upper lobe, follow-up with noncontrast chest CT 6 to 58-month Dry mouth syndrome, resolved after dentist recommend mouth wash             Persisted running nose, facial pressure, sore throat, underwent ENT evaluation              Esophageal candidiasis, recurrent thrush identified on swallow study, FEES showed diffused white secretion coating areas of white plaques throughout pharynx. The patient c/o sore in buccal sides of tongue and in her throat when swallows. Treated with Diflucan  in the past, underwent GI evaluation, EGD 03/25/22 normal findings. Last treated with Diflucan  300mg  x1 04/10/22, then followed 200mg  qd x1 wk, may repeat x1.                Edema, not apparent, hx of LLE negative DVT venous US ,  Varicose veins LLE not new.  Tinnitus, underwent eval of  ENT, suggested music background.  Dysphagia, GI evaluated, EGD 03/25/22 wnl, working with ST, thicken liquids.              Constipation, Metamucil, MiraLax ,  effective.              GERD/Hx of UGIB, stable, on pantoprazole , Hgb 11.2 11/18/23             HTN, blood pressure is controlled off Losartan , Bun/creat 18/0.83 11/07/23             Bipolar disorder, stable, on Lithium , GDR due to bradycardia, takes Alprazolam  too, TSH 1.15 10/05/23. Declined Dr Cleotilde started low dose of Depakote . Lithium  level 0.4 07/30/22             Bradycardia, improved from 40s to 50s. Hx of normalized HR after Tylenol  PM dc'd, GDR of Lithium  helped too. BNP 67.4 08/06/23             Hypercalcemia, f/u  endocrinology, Ca 10.3 04/18/23, 10.4 10/25/23, 01/12/20<<10.6 08/06/23,  PTH 79 01/12/20. GDR of Lithium  may help            OA, takes  Tylenol , s/p R knee arthroplasty.             Cervical degenerative changes/dystonia, better, CT/MRI 12/2019 ruled out acute process, aches in neck R>L travels to the back of head, comes and goes, not disabling, f/u Neurosurgery. Failed Cymbalta , Gabapentin , Robaxin , Ultracet . s/p Botulinum Toxin inj by Neurology. ESR 9, CRP 2.7 10/09/20(ESR 6 07/30/22). The patient stated Alprazolam  and Tylenol  are kind of effective. Improved on Topamax , but associated with weight loss.  No Parkinson's disease, took her off Sinemet  per Neurology. Currently taking inj               Cognitive impairment: 12/2019 MRI brain showed chronic microvascular ischemic changes, no acute disease with mild cortical atrophy, TSH 1.56 07/29/23             Hyperlipidemia, LDL 121 07/29/23, on diet, tolerated Pravastatin       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, possible    Tremors possible parkinsons   Manic disorder, single episode, in full remission 12/01/2009   Sixth nerve palsy    Past Surgical History:  Procedure Laterality Date   BILATERAL SALPINGOOPHORECTOMY  09/22/2007   CATARACT EXTRACTION Bilateral    ESOPHAGOGASTRODUODENOSCOPY (EGD) WITH PROPOFOL  N/A 03/25/2022   Procedure: ESOPHAGOGASTRODUODENOSCOPY (EGD) WITH PROPOFOL ;  Surgeon:  Legrand Victory LITTIE DOUGLAS, MD;  Location: [facility];  Service: Gastroenterology;  Laterality: N/A;   Gastrointestinal Stroma Tumor,  Other  1960   GIST Surgery   ILEOCECETOMY  09/22/2007   LAPAROSCOPIC LYSIS OF ADHESIONS N/A 10/07/2023   Procedure: LYSIS, ADHESIONS, LAPAROSCOPIC;  Surgeon: Dasie Leonor LITTIE, MD;  Location: [facility];  Service: General;  Laterality: N/A;   LAPAROSCOPY N/A 10/07/2023   Procedure: DIAGNOSTIC LAPAROSCOPY, EXPLORATORY LAPAROTOMY WITH LYSIS OF ADHESIONS, REPAIR OF  SMALL BOWEL ENTEROTOMY;  Surgeon: Dasie Leonor CROME, MD;  Location: [facility];  Service: General;  Laterality: N/A;   OVARIAN CYST REMOVAL Right 1967   SMALL INTESTINE SURGERY  09/22/2007   TOTAL KNEE ARTHROPLASTY Right 10/05/2019   Procedure: RIGHT TOTAL KNEE ARTHROPLASTY;  Surgeon: Addie Cordella Hamilton, MD;  Location: [facility] OR;  Service: Orthopedics;  Laterality: Right;   TOTAL VAGINAL HYSTERECTOMY  1986   Fibroids    Allergies  Allergen Reactions   Lisinopril Cough   Penicillins Itching and Other (See Comments)    50 years ago   Adhesive [Tape] Itching   Atorvastatin Itching   Dilaudid  [Hydromorphone  Hcl] Itching    Allergies as of 11/28/2023       Reactions   Lisinopril Cough   Penicillins Itching, Other (See Comments)   50 years ago   Adhesive [tape] Itching   Atorvastatin Itching   Dilaudid  [hydromorphone  Hcl] Itching        Medication List        Accurate as of November 28, 2023  5:32 PM. If you have any questions, ask your nurse or doctor.          acetaminophen  325 MG tablet Commonly known as: TYLENOL  Take 650 mg by mouth every 4 (four) hours as needed (for pain).   ALPRAZolam  0.5 MG tablet Commonly known as: XANAX  Take 1 tablet (0.5 mg total) by mouth 3 (three) times daily.   amiodarone  100 MG tablet Commonly known as: PACERONE  Take 1 tablet (100 mg total) by mouth daily.   apixaban 2.5 MG Tabs tablet Commonly known as: ELIQUIS Take 1 tablet (2.5 mg total) by mouth 2 (two)  times daily.   cycloSPORINE  0.05 % ophthalmic emulsion Commonly known as: RESTASIS  Place 1 drop into both eyes 2 (two) times daily.   HYDROcodone-acetaminophen  5-325 MG tablet Commonly known as: NORCO/VICODIN Take 0.5 tablets by mouth 2 (two) times daily for 5 days. Started by: Tressa Maldonado X Sherrell Weir   lactose free nutrition Liqd Take 237 mLs by mouth in the morning, at noon, and at bedtime.   latanoprost  0.005 % ophthalmic solution Commonly known as: XALATAN  Place 1 drop into both eyes at bedtime.   lidocaine  5 % Commonly known as: LIDODERM  Place 1 patch onto the skin daily. Remove & Discard patch within 12 hours or as directed by MD   lithium  carbonate 150 MG capsule TAKE ONE CAPSULE BY MOUTH TWICE DAILY WITH MEALS   multivitamin with minerals Tabs tablet Take 1 tablet by mouth daily.   ondansetron  4 MG tablet Commonly known as: ZOFRAN  Take 4 mg by mouth every 6 (six) hours as needed for nausea.   pantoprazole  40 MG tablet Commonly known as: PROTONIX  Take 1 tablet (40 mg total) by mouth 2 (two) times daily.   polyethylene glycol 17 g packet Commonly known as: MIRALAX  / GLYCOLAX  Take 17 g by mouth every other day.   pravastatin  10 MG tablet Commonly known as: PRAVACHOL  Take 5 mg by mouth every evening.   Systane 0.4-0.3 % Soln Generic drug: Polyethyl Glycol-Propyl Glycol Place 1 drop into both eyes every 4 (four) hours as needed (for dryness).   Systane Nighttime Oint Place 1 Application into both eyes at bedtime.   Timolol  Maleate (Once-Daily) 0.5 % Soln Place 1 drop into both eyes daily.   topiramate  25 MG tablet Commonly known as: TOPAMAX  TAKE ONE TABLET BY MOUTH TWICE DAILY IN THE MORNING AND IN THE EVENING What changed: See the new instructions.  Review of Systems  Constitutional:  Positive for appetite change and fatigue. Negative for fever.  HENT:  Positive for hearing loss and rhinorrhea. Negative for congestion.   Eyes:  Negative for visual  disturbance.       Glaucoma, diplopia R+L lateral peripheral visual fields only.   Respiratory:  Positive for shortness of breath. Negative for cough and wheezing.        Occasionally DOE, hacking cough  Cardiovascular:  Negative for leg swelling.  Gastrointestinal:  Negative for abdominal pain and constipation.  Genitourinary:  Positive for frequency. Negative for dysuria and urgency.       Couple of times at night, no difficulty of returning asleep.   Musculoskeletal:  Positive for arthralgias, gait problem and neck pain.       Walker.   Skin:  Negative for color change.  Neurological:  Negative for tremors, speech difficulty, light-headedness and headaches.       Comes and goes nature of the neck/occipital pain. Stiff neck. Pain the patent right knee, s/p TKR. Tremor is better since Sinemet .   Psychiatric/Behavioral:  Positive for sleep disturbance. Negative for confusion. The patient is nervous/anxious.        Better mood, feels not as anxious or depressive mood as prior.     Immunization History  Administered Date(s) Administered   Fluad Quad(high Dose 65+) 11/03/2017, 10/30/2020   INFLUENZA, HIGH DOSE SEASONAL PF 11/02/2013, 11/02/2013, 11/03/2017, 10/26/2018, 10/27/2019, 11/13/2022   Influenza Split 10/24/2008, 09/26/2009, 10/08/2011, 10/05/2012   Influenza, Quadrivalent, Recombinant, Inj, Pf 10/11/2016   Influenza,inj,Quad PF,6+ Mos 11/01/2014, 10/11/2015, 10/11/2016   Influenza-Unspecified 10/24/2008, 09/26/2009, 10/08/2011, 10/05/2012, 11/02/2013, 10/11/2016, 11/05/2021   Moderna Covid-19 Vaccine Bivalent Booster 30yrs & up 11/13/2022   Moderna SARS-COV2 Booster Vaccination 06/13/2020   Moderna Sars-Covid-2 Vaccination 01/18/2019, 02/15/2019, 11/23/2019   Pfizer Covid-19 Vaccine Bivalent Booster 78yrs & up 11/15/2021   Pneumococcal Conjugate-13 08/20/2011   Pneumococcal Polysaccharide-23 01/14/2005, 06/29/2014   Pneumococcal-Unspecified 01/14/2005   Tdap 08/20/2011,  04/18/2023   Zoster Recombinant(Shingrix) 11/09/2018, 12/01/2018, 03/01/2019   Zoster, Live 10/08/2011   Pertinent  Health Maintenance Due  Topic Date Due   Influenza Vaccine  08/15/2023   DEXA SCAN  Completed   Mammogram  Discontinued      01/23/2023    3:14 PM 04/18/2023    8:59 AM 04/18/2023    2:40 PM 07/31/2023    1:01 PM 10/02/2023    8:51 AM  Fall Risk  Falls in the past year? 0 0 0 1 1  Was there an injury with Fall? 0 0 0 0 0  Was there an injury with Fall? - Comments    hit right knee   Fall Risk Category Calculator 0 0 0 1 1  Patient at Risk for Falls Due to  No Fall Risks No Fall Risks No Fall Risks History of fall(s);Impaired balance/gait  Fall risk Follow up  Falls evaluation completed Falls evaluation completed Falls evaluation completed Falls evaluation completed   Functional Status Survey:    Vitals:   11/28/23 1647  BP: 124/74  Pulse: (!) 57  Resp: 18  Temp: 98.8 F (37.1 C)  SpO2: 99%  Weight: 105 lb 11.2 oz (47.9 kg)   Body mass index is 19.33 kg/m. Physical Exam Vitals and nursing note reviewed.  Constitutional:      Comments: fatigue  HENT:     Head: Normocephalic and atraumatic.     Nose: Nose normal.     Mouth/Throat:     Mouth: Mucous membranes  are moist.  Eyes:     Extraocular Movements: Extraocular movements intact.     Conjunctiva/sclera: Conjunctivae normal.     Pupils: Pupils are equal, round, and reactive to light.  Neck:     Comments: Pain in neck and occiput when sitting up  No spinal spinous processes tenderness palpated.  Cardiovascular:     Rate and Rhythm: Normal rate and regular rhythm.     Heart sounds: Murmur heard.     Comments: DP pulses present R+L. HR in 50s Pulmonary:     Effort: Pulmonary effort is normal.     Breath sounds: No rales.  Abdominal:     General: Bowel sounds are normal.     Palpations: Abdomen is soft.     Tenderness: There is no abdominal tenderness.  Musculoskeletal:     Cervical back: Normal  range of motion and neck supple. Tenderness present.     Right lower leg: No edema.     Left lower leg: No edema.     Comments: Chronic R knee pain is improved. S/p TKR right. Felt lateral right thigh muscle is tight.  Stiff neck, comes and goes neck/occipital pain. Trace edema BLE. Arthritic joint change fingers.   Skin:    General: Skin is warm and dry.     Comments: Improved the medial lower left leg a palm sized thickened, slightly reddened area, mild discomfort when palpated. DP present left foot. Scattered varicose veins left leg 05/09/22 medical left MTJ callous, plantar aspect 3rd toe callous, the left 2nd toe pain after injury, but no bruise, deformity, swelling, redness, able to PROM w/o pain.    Neurological:     General: No focal deficit present.     Mental Status: She is alert and oriented to person, place, and time. Mental status is at baseline.     Motor: No weakness.     Coordination: Coordination abnormal.     Gait: Gait abnormal.     Comments: Resting tremor in fingers, near resolution.   Psychiatric:        Mood and Affect: Mood normal.        Behavior: Behavior normal.        Thought Content: Thought content normal.     Labs reviewed: Recent Labs    10/22/23 0435 10/23/23 0106 10/25/23 0312 11/05/23 2240 11/07/23 0408  NA 138 134* 141 138 147*  K 4.2 4.2 4.2 3.8 3.8  CL 112* 108 109 110 121*  CO2 18* 17* 25 19* 20*  GLUCOSE 111* 117* 120* 124* 109*  BUN 38* 39* 29* 36* 18  CREATININE 0.64 0.59 0.62 0.95 0.83  CALCIUM 10.0 10.1 10.4* 11.6* 11.0*  MG 2.2 2.1 2.5*  --   --   PHOS 2.5 2.9 3.2  --   --    Recent Labs    10/20/23 0308 10/23/23 0106 11/05/23 2240  AST 13* 17 20  ALT 9 11 15   ALKPHOS 100 93 86  BILITOT 0.2 0.2 0.3  PROT 4.8* 5.8* 6.1*  ALBUMIN  2.6* 3.2* 3.5   Recent Labs    10/25/23 0312 10/26/23 0312 10/27/23 0247 11/05/23 2240 11/06/23 0606 11/06/23 2002 11/06/23 2157 11/07/23 0408 11/08/23 0429  WBC 6.5 6.8 6.8   < > 8.9   --   --  7.4 7.4  NEUTROABS 3.9 4.2 4.7  --   --   --   --   --   --   HGB 8.5* 9.2* 8.8*   < >  9.3*   < > 9.5* 8.6* 8.6*  HCT 27.4* 29.9* 28.8*   < > 29.4*   < > 30.4* 27.4* 28.7*  MCV 103.4* 103.1* 103.6*   < > 103.2*  --   --  105.0* 105.5*  PLT 367 356 318   < > 248  --   --  236 246   < > = values in this interval not displayed.   Lab Results  Component Value Date   TSH 1.150 10/05/2023   No results found for: HGBA1C Lab Results  Component Value Date   CHOL 212 (H) 07/29/2023   HDL 62 07/29/2023   LDLCALC 121 (H) 07/29/2023   TRIG 65 10/20/2023   CHOLHDL 3.4 07/29/2023    Significant Diagnostic Results in last 30 days:  CT HEAD WO CONTRAST ( ) Result Date: 11/06/2023 EXAM: CT HEAD WITHOUT CONTRAST 11/06/2023 08:27:58 AM TECHNIQUE: CT of the head was performed without the administration of intravenous contrast. Automated exposure control, iterative reconstruction, and/or weight based adjustment of the mA/kV was utilized to reduce the radiation dose to as low as reasonably achievable. COMPARISON: Brain MRI 01/13/2020. Head CT 10/16/2021. CLINICAL HISTORY: Delirium. FINDINGS: BRAIN AND VENTRICLES: No acute hemorrhage. No evidence of acute infarct. No hydrocephalus. No extra-axial collection. No mass effect or midline shift. Volume remains within normal limits for age. Minimal chronic white matter hypodensity. No suspicious intracranial vascular hyperdensity. ORBITS: No acute abnormality. SINUSES: No acute abnormality. SOFT TISSUES AND SKULL: No acute soft tissue abnormality. No skull fracture. IMPRESSION: 1. Negative for age non-contrast head CT. Electronically signed by: Helayne Hurst MD 11/06/2023 08:36 AM EDT RP Workstation: [id number]   CT ABDOMEN PELVIS W CONTRAST Result Date: 11/06/2023 CLINICAL DATA:  Abdominal pain with cough and ground emesis EXAM: CT ABDOMEN AND PELVIS WITH CONTRAST TECHNIQUE: Multidetector CT imaging of the abdomen and pelvis was performed using the  standard protocol following bolus administration of intravenous contrast. RADIATION DOSE REDUCTION: This exam was performed according to the departmental dose-optimization program which includes automated exposure control, adjustment of the mA and/or kV according to patient size and/or use of iterative reconstruction technique. CONTRAST:  80mL OMNIPAQUE  IOHEXOL  300 MG/ML  SOLN COMPARISON:  10/24/2023 FINDINGS: Lower chest: Small right-sided pleural effusion is seen new from the prior exam. Sliding-type hiatal hernia is noted. Hepatobiliary: Scattered cysts are seen within the liver. The gallbladder is within normal limits. Pancreas: Unremarkable. No pancreatic ductal dilatation or surrounding inflammatory changes. Spleen: Normal in size without focal abnormality. Adrenals/Urinary Tract: Adrenal glands are well visualized and within normal limits. Kidneys demonstrate a normal enhancement pattern. Scattered cysts are noted within the left kidney. No follow-up is recommended. The bladder is well distended. Previously seen air within the bladder has resolved. Stomach/Bowel: Changes consistent with prior ileocecectomy are again noted. Hiatal hernia is noted. Stomach is otherwise within normal limits. No small bowel abnormality is noted. Colon shows no obstructive changes. Vascular/Lymphatic: Aortic atherosclerosis. No enlarged abdominal or pelvic lymph nodes. Reproductive: Status post hysterectomy. No adnexal masses. Other: Previously seen pelvic fluid collection has resolved. Small seroma is noted along the anterior abdominal wound site measuring up to 15 mm again decreased in size prior exam. No new focal fluid collection is seen. Musculoskeletal: Degenerative changes of lumbar spine are noted. IMPRESSION: Sliding-type hiatal hernia. Postsurgical changes in the colon without obstructive change. Further decrease in size of abdominal wound seroma anteriorly. Small right-sided pleural effusion new from the prior study.  Electronically Signed   By: Oneil Evelyne HERO.D.  On: 11/06/2023 01:12   DG Chest Port 1 View Result Date: 11/06/2023 CLINICAL DATA:  Vomiting EXAM: PORTABLE CHEST 1 VIEW COMPARISON:  10/08/2023 FINDINGS: The heart size and mediastinal contours are within normal limits. Both lungs are clear. The visualized skeletal structures are unremarkable. IMPRESSION: No active disease. Electronically Signed   By: Luke Bun M.D.   On: 11/06/2023 00:02    Assessment/Plan  Iron  deficiency anemia Hgb 11.2 11/18/23  GERD (gastroesophageal reflux disease)   GERD/Hx of UGIB, stable, on pantoprazole   Edema not apparent, hx of LLE negative DVT venous US ,  Varicose veins LLE not new.   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  Her hospital stay was complicated with with post operative ileus, postop anemia, pelvic abscess-completed antibiotics.  Hx of GIST of small intestine s/p resection. MiraLax , Psyllium   Paroxysmal atrial fibrillation (HCC) Heart rate is in control New onset of A-fib, follow-up cardiology, on amiodarone , resumed Eliquis 10/31/25after UGIB resolved.   Essential (primary) hypertension blood pressure is controlled off Losartan , Bun/creat 18/0.83 11/07/23  Bipolar disorder (HCC) Bipolar disorder, stable, on Lithium , GDR due to bradycardia, takes Alprazolam  too, TSH 1.15 10/05/23. Declined Dr Cleotilde started low dose of Depakote . Lithium  level 0.4 07/30/22  Bradycardia Bradycardia, improved from 40s to 50s. Hx of normalized HR after Tylenol  PM dc'd, GDR of Lithium  helped too. BNP 67.4 08/06/23  Cervical dystonia  Cervical degenerative changes/dystonia, better, CT/MRI 12/2019 ruled out acute process, aches in neck R>L travels to the back of head,  comes and goes, not disabling, f/u Neurosurgery. Failed Cymbalta , Gabapentin , Robaxin , Ultracet . s/p Botulinum Toxin inj by Neurology. ESR 9, CRP 2.7 10/09/20(ESR 6 07/30/22). The patient stated Alprazolam  and Tylenol  are kind of effective. Improved on Topamax , but associated with weight loss.  No Parkinson's disease, took her off Sinemet  per Neurology. Currently taking inj-declined.  Declined neurology f/u Will X-ray cervical spine, try Norco 5/325mg  1/2 tab bid hold for intolerance or AR   Hypercalcemia Will update CMP/eGFR   Family/ staff Communication: plan of care reviewed with the patient, child psychotherapist, and press photographer.   Labs/tests ordered:  Xray cervical spine, CMP/eGFR

## 2023-11-28 NOTE — Assessment & Plan Note (Signed)
 blood pressure is controlled off Losartan , Bun/creat 18/0.83 11/07/23

## 2023-11-28 NOTE — Assessment & Plan Note (Signed)
 Bipolar disorder, stable, on Lithium , GDR due to bradycardia, takes Alprazolam  too, TSH 1.15 10/05/23. Declined Dr Cleotilde started low dose of Depakote . Lithium  level 0.4 07/30/22

## 2023-11-28 NOTE — Assessment & Plan Note (Signed)
 Hgb 11.2 11/18/23

## 2023-11-28 NOTE — Assessment & Plan Note (Signed)
Will update CMP/eGFR

## 2023-11-28 NOTE — Assessment & Plan Note (Signed)
 Hospitalized 10/01/2023 to 10/27/2023 for small bowel obstruction, status post laparoscopy, exploratory laparotomy with lysis of adhesions, repair of the small bowel enterotomy 10/07/2023.  Her hospital stay was complicated with with post operative ileus, postop anemia, pelvic abscess-completed antibiotics.  Hx of GIST of small intestine s/p resection. MiraLax , Psyllium

## 2023-11-28 NOTE — Assessment & Plan Note (Signed)
 not apparent, hx of LLE negative DVT venous US ,  Varicose veins LLE not new.

## 2023-12-01 DIAGNOSIS — M542 Cervicalgia: Secondary | ICD-10-CM | POA: Diagnosis not present

## 2023-12-01 DIAGNOSIS — R29898 Other symptoms and signs involving the musculoskeletal system: Secondary | ICD-10-CM | POA: Diagnosis not present

## 2023-12-01 DIAGNOSIS — R41841 Cognitive communication deficit: Secondary | ICD-10-CM | POA: Diagnosis not present

## 2023-12-02 DIAGNOSIS — Z0189 Encounter for other specified special examinations: Secondary | ICD-10-CM | POA: Diagnosis not present

## 2023-12-02 LAB — COMPREHENSIVE METABOLIC PANEL WITH GFR
Albumin: 3.6 (ref 3.5–5.0)
Calcium: 10.7 (ref 8.7–10.7)
Globulin: 2.2
eGFR: 65

## 2023-12-02 LAB — BASIC METABOLIC PANEL WITH GFR
BUN: 21 (ref 4–21)
CO2: 20 (ref 13–22)
Chloride: 112 — AB (ref 99–108)
Creatinine: 0.9 (ref 0.5–1.1)
Glucose: 96
Potassium: 4.2 meq/L (ref 3.5–5.1)
Sodium: 139 (ref 137–147)

## 2023-12-02 LAB — HEPATIC FUNCTION PANEL
ALT: 8 U/L (ref 7–35)
AST: 14 (ref 13–35)
Alkaline Phosphatase: 66 (ref 25–125)
Bilirubin, Total: 0.2

## 2023-12-03 ENCOUNTER — Other Ambulatory Visit: Payer: Self-pay | Admitting: Adult Health

## 2023-12-03 DIAGNOSIS — F02B0 Dementia in other diseases classified elsewhere, moderate, without behavioral disturbance, psychotic disturbance, mood disturbance, and anxiety: Secondary | ICD-10-CM | POA: Diagnosis not present

## 2023-12-03 DIAGNOSIS — F419 Anxiety disorder, unspecified: Secondary | ICD-10-CM

## 2023-12-03 MED ORDER — ALPRAZOLAM 0.5 MG PO TABS: 0.5000 mg | ORAL_TABLET | Freq: Three times a day (TID) | ORAL | 0 refills | Status: DC

## 2023-12-05 ENCOUNTER — Other Ambulatory Visit: Payer: Self-pay | Admitting: Nurse Practitioner

## 2023-12-05 DIAGNOSIS — F419 Anxiety disorder, unspecified: Secondary | ICD-10-CM

## 2023-12-05 MED ORDER — ALPRAZOLAM 0.5 MG PO TABS: 0.5000 mg | ORAL_TABLET | Freq: Three times a day (TID) | ORAL | 0 refills | Status: DC

## 2023-12-09 ENCOUNTER — Encounter: Payer: Self-pay | Admitting: Nurse Practitioner

## 2023-12-09 NOTE — Progress Notes (Signed)
 This encounter was created in error - please disregard.

## 2023-12-16 ENCOUNTER — Encounter: Payer: Self-pay | Admitting: Nurse Practitioner

## 2023-12-16 ENCOUNTER — Non-Acute Institutional Stay (SKILLED_NURSING_FACILITY): Payer: Self-pay | Admitting: Nurse Practitioner

## 2023-12-16 DIAGNOSIS — N390 Urinary tract infection, site not specified: Secondary | ICD-10-CM | POA: Insufficient documentation

## 2023-12-16 DIAGNOSIS — R300 Dysuria: Secondary | ICD-10-CM | POA: Insufficient documentation

## 2023-12-16 NOTE — Progress Notes (Unsigned)
 Location:  [facility] Room Number: N065-A Place of Service:  SNF (31) Provider:  Chantz Montefusco X, NP  Patient Care Team: Sigismund Cross X, NP as PCP - General (Internal Medicine) Devonna Sieving, MD as Consulting Physician (Gynecologic Oncology) Cloretta Arley NOVAK, MD as Consulting Physician (Oncology) Luis Purchase, MD as Consulting Physician (Gastroenterology) Octavia Charleston, MD as Consulting Physician (Ophthalmology) Cleotilde Fairy POUR, MD as Referring Physician (Neurology) Tat, Asberry RAMAN, DO as Consulting Physician (Neurology)  Extended Emergency Contact Information Primary Emergency Contact: Corlis Sieving Mulders Address: [address]          [place], [place] [address] [place] Home Phone: [phone] Mobile Phone: [phone] Relation: Son Secondary Emergency Contact: Corlis Comer Numbers Home Phone: [phone] Mobile Phone: [phone] Relation: Relative  Code Status:  DNR Goals of care: Advanced Directive information    12/16/2023    2:04 PM  Advanced Directives  Does Patient Have a Medical Advance Directive? Yes  Type of Estate Agent of [place];Out of facility DNR (pink MOST or yellow form)  Does patient want to make changes to medical advance directive? No - Patient declined  Copy of Healthcare Power of Attorney in Chart? Yes - validated most recent copy scanned in chart (See row information)  Pre-existing out of facility DNR order (yellow form or pink MOST form) Yellow form placed in chart (order not valid for inpatient use)     Chief Complaint  Patient presents with   Medical Management of Chronic Issues    HPI:  Pt is a 83 y.o. female seen today for management chronic medical issues  The patient complaining of a burning sensation upon urination, denied urinary frequency, urgency, incontinence.  Denied lower abdomen pressure/pain/discomfort.  She is afebrile and in her usual state of health.     Hospitalized 11/05/23-11/08/23 AMS, Afib, UGIB, shingles of abd.              UGIB-HPOA declined GI f/u              Hospitalized 10/01/2023 to 10/27/2023 for small bowel obstruction, status post laparoscopy, exploratory laparotomy with lysis of adhesions, repair of the small bowel enterotomy 10/07/2023.  Her hospital stay was complicated with with post operative ileus, postop anemia, pelvic abscess-completed antibiotics.             Hx of GIST of small intestine s/p resection. MiraLax , Psyllium              Post op anemia, received IV iron  in the hospital, Iron  49, Vit B12 931 10/20/23, Hgb 7.8 10/21/23, Hgb 11.2 11/18/23             Pelvic abscess completed ABD in hospital, noted on CT 10/13/23, cleared by surgeon upon discharge.              New onset of A-fib, follow-up cardiology, on amiodarone , resumed Eliquis  10/31/25after UGIB resolved.              Pulmonary nodule, 7 mm posterior right upper lobe, follow-up with noncontrast chest CT 6 to 31-month Dry mouth syndrome, resolved after dentist recommend mouth wash             Persisted running nose, facial pressure, sore throat, underwent ENT evaluation              Esophageal candidiasis, recurrent thrush identified on swallow study, FEES showed diffused white secretion coating areas of white plaques throughout pharynx. The patient c/o sore in buccal sides of  tongue and in her throat when swallows. Treated with Diflucan  in the past, underwent GI evaluation, EGD 03/25/22 normal findings. Last treated with Diflucan  300mg  x1 04/10/22, then followed 200mg  qd x1 wk, may repeat x1.                Edema, not apparent, hx of LLE negative DVT venous US ,  Varicose veins LLE not new.  Tinnitus, underwent eval of  ENT, suggested music background.                          Dysphagia, GI evaluated, EGD 03/25/22 wnl, working with ST, thicken liquids.              Constipation, Metamucil, MiraLax ,  effective.              GERD/Hx of UGIB, stable, on pantoprazole , Hgb  11.2 11/18/23             HTN, blood pressure is controlled off Losartan , Bun/creat 18/0.83 11/07/23             Bipolar disorder, stable, on Lithium , GDR due to bradycardia, takes Alprazolam  too, TSH 1.15 10/05/23. Declined Dr Cleotilde started low dose of Depakote . Lithium  level 0.4 07/30/22             Bradycardia, improved from 40s to 50s. Hx of normalized HR after Tylenol  PM dc'd, GDR of Lithium  helped too. BNP 67.4 08/06/23             Hypercalcemia, f/u endocrinology, Ca 10.3 04/18/23, 10.4 10/25/23, 01/12/20<<10.6 08/06/23,  PTH 79 01/12/20. GDR of Lithium  may help            OA, takes  Tylenol , s/p R knee arthroplasty.             Cervical degenerative changes/dystonia, better, CT/MRI 12/2019 ruled out acute process, aches in neck R>L travels to the back of head, comes and goes, not disabling, f/u Neurosurgery. Failed Cymbalta , Gabapentin , Robaxin , Ultracet . s/p Botulinum Toxin inj by Neurology. ESR 9, CRP 2.7 10/09/20(ESR 6 07/30/22). The patient stated Alprazolam  and Tylenol  are kind of effective. Improved on Topamax , but associated with weight loss.  No Parkinson's disease, took her off Sinemet  per Neurology. Currently taking inj               Cognitive impairment: 12/2019 MRI brain showed chronic microvascular ischemic changes, no acute disease with mild cortical atrophy, TSH 1.56 07/29/23             Hyperlipidemia, LDL 121 07/29/23, on diet, tolerated Pravastatin       Past Medical History:  Diagnosis Date   Anemia    Anxiety    Arthritis    Cardiac conduction disorder 03/30/2012   Overview:  STORY: ETT 03/09/2012 Echo 03/07/2012 normal Dr Ladona, bradycardia felt due to glaucoma eye drops   Cervical dystonia    Diverticulosis of colon    DOE (dyspnea on exertion) 03/25/2018   Gastroesophageal cancer (HCC)    GERD (gastroesophageal reflux disease)    GIST (gastrointestinal stroma tumor), malignant, colon (HCC)    Glaucoma    Heart murmur    History of colon polyps 10/24/2008   Hypertension     Major neurocognitive disorder due to Parkinson's disease, possible    Tremors possible parkinsons   Manic disorder, single episode, in full remission 12/01/2009   Sixth nerve palsy    Past Surgical History:  Procedure Laterality Date   BILATERAL SALPINGOOPHORECTOMY  09/22/2007  CATARACT EXTRACTION Bilateral    ESOPHAGOGASTRODUODENOSCOPY (EGD) WITH PROPOFOL  N/A 03/25/2022   Procedure: ESOPHAGOGASTRODUODENOSCOPY (EGD) WITH PROPOFOL ;  Surgeon: Legrand Victory LITTIE DOUGLAS, MD;  Location: [facility];  Service: Gastroenterology;  Laterality: N/A;   Gastrointestinal Stroma Tumor,  Other  1960   GIST Surgery   ILEOCECETOMY  09/22/2007   LAPAROSCOPIC LYSIS OF ADHESIONS N/A 10/07/2023   Procedure: LYSIS, ADHESIONS, LAPAROSCOPIC;  Surgeon: Dasie Leonor LITTIE, MD;  Location: [facility];  Service: General;  Laterality: N/A;   LAPAROSCOPY N/A 10/07/2023   Procedure: DIAGNOSTIC LAPAROSCOPY, EXPLORATORY LAPAROTOMY WITH LYSIS OF ADHESIONS, REPAIR OF SMALL BOWEL ENTEROTOMY;  Surgeon: Dasie Leonor LITTIE, MD;  Location: [facility];  Service: General;  Laterality: N/A;   OVARIAN CYST REMOVAL Right 1967   SMALL INTESTINE SURGERY  09/22/2007   TOTAL KNEE ARTHROPLASTY Right 10/05/2019   Procedure: RIGHT TOTAL KNEE ARTHROPLASTY;  Surgeon: Addie Cordella Hamilton, MD;  Location: [facility];  Service: Orthopedics;  Laterality: Right;   TOTAL VAGINAL HYSTERECTOMY  1986   Fibroids    Allergies  Allergen Reactions   Lisinopril Cough   Penicillins Itching and Other (See Comments)    50 years ago   Atorvastatin Itching   Dilaudid  [Hydromorphone  Hcl] Itching   Tape Itching    Outpatient Encounter Medications as of 12/16/2023  Medication Sig   acetaminophen  (TYLENOL ) 325 MG tablet Take 650 mg by mouth every 4 (four) hours as needed (for pain).   ALPRAZolam  (XANAX ) 0.5 MG tablet Take 1 tablet (0.5 mg total) by mouth 3 (three) times daily.   amiodarone  (PACERONE ) 100 MG tablet Take 1 tablet (100 mg total) by mouth daily.   apixaban   (ELIQUIS ) 2.5 MG TABS tablet Take 1 tablet (2.5 mg total) by mouth 2 (two) times daily.   ciprofloxacin  (CIPRO ) 500 MG tablet Take 1 tablet (500 mg total) by mouth 2 (two) times daily for 7 days.   cycloSPORINE  (RESTASIS ) 0.05 % ophthalmic emulsion Place 1 drop into both eyes 2 (two) times daily.   lactose free nutrition (BOOST) LIQD Take 237 mLs by mouth in the morning, at noon, and at bedtime.   latanoprost  (XALATAN ) 0.005 % ophthalmic solution Place 1 drop into both eyes at bedtime.   lidocaine  (LIDODERM ) 5 % Place 1 patch onto the skin daily. Remove & Discard patch within 12 hours or as directed by MD   lithium  carbonate 150 MG capsule TAKE ONE CAPSULE BY MOUTH TWICE DAILY WITH MEALS   Multiple Vitamin (MULTIVITAMIN WITH MINERALS) TABS tablet Take 1 tablet by mouth daily.   ondansetron  (ZOFRAN ) 4 MG tablet Take 4 mg by mouth every 6 (six) hours as needed for nausea.   pantoprazole  (PROTONIX ) 40 MG tablet Take 1 tablet (40 mg total) by mouth 2 (two) times daily.   polyethylene glycol (MIRALAX  / GLYCOLAX ) 17 g packet Take 17 g by mouth every other day.   pravastatin  (PRAVACHOL ) 10 MG tablet Take 5 mg by mouth every evening.   SYSTANE 0.4-0.3 % SOLN Place 1 drop into both eyes every 4 (four) hours as needed (for dryness).   Timolol  Maleate, Once-Daily, 0.5 % SOLN Place 1 drop into both eyes daily.   topiramate  (TOPAMAX ) 25 MG tablet TAKE ONE TABLET BY MOUTH TWICE DAILY IN THE MORNING AND IN THE EVENING (Patient taking differently: Take 25 mg by mouth 2 (two) times daily.)   White Petrolatum-Mineral Oil (SYSTANE NIGHTTIME) OINT Place 1 Application into both eyes at bedtime.   No facility-administered encounter medications on file as of 12/16/2023.  Review of Systems  Immunization History  Administered Date(s) Administered   Fluad Quad(high Dose 65+) 11/03/2017, 10/30/2020   INFLUENZA, HIGH DOSE SEASONAL PF 11/02/2013, 11/02/2013, 11/03/2017, 10/26/2018, 10/27/2019, 11/13/2022   Influenza  Split 10/24/2008, 09/26/2009, 10/08/2011, 10/05/2012   Influenza, Quadrivalent, Recombinant, Inj, Pf 10/11/2016   Influenza,inj,Quad PF,6+ Mos 11/01/2014, 10/11/2015, 10/11/2016   Influenza-Unspecified 10/24/2008, 09/26/2009, 10/08/2011, 10/05/2012, 11/02/2013, 10/11/2016, 11/05/2021   Moderna Covid-19 Vaccine Bivalent Booster 58yrs & up 11/13/2022   Moderna SARS-COV2 Booster Vaccination 06/13/2020   Moderna Sars-Covid-2 Vaccination 01/18/2019, 02/15/2019, 11/23/2019   Pfizer Covid-19 Vaccine Bivalent Booster 59yrs & up 11/15/2021   Pneumococcal Conjugate-13 08/20/2011   Pneumococcal Polysaccharide-23 01/14/2005, 06/29/2014   Pneumococcal-Unspecified 01/14/2005   Tdap 08/20/2011, 04/18/2023   Zoster Recombinant(Shingrix) 11/09/2018, 12/01/2018, 03/01/2019   Zoster, Live 10/08/2011   Pertinent  Health Maintenance Due  Topic Date Due   Influenza Vaccine  08/15/2023   Bone Density Scan  Completed   Mammogram  Discontinued      01/23/2023    3:14 PM 04/18/2023    8:59 AM 04/18/2023    2:40 PM 07/31/2023    1:01 PM 10/02/2023    8:51 AM  Fall Risk  Falls in the past year? 0 0 0 1 1  Was there an injury with Fall? 0  0  0  0  0   Was there an injury with Fall? - Comments    hit right knee    Fall Risk Category Calculator 0 0 0 1 1  Patient at Risk for Falls Due to  No Fall Risks No Fall Risks No Fall Risks History of fall(s);Impaired balance/gait  Fall risk Follow up  Falls evaluation completed Falls evaluation completed Falls evaluation completed Falls evaluation completed     Data saved with a previous flowsheet row definition   Functional Status Survey:    Vitals:   12/16/23 1419  BP: 125/63  Pulse: (!) 51  Resp: 18  Temp: 98.8 F (37.1 C)  SpO2: 95%  Weight: 105 lb 4.8 oz (47.8 kg)  Height: 5' 2 (1.575 m)   Body mass index is 19.26 kg/m. Physical Exam  Labs reviewed: Recent Labs    10/22/23 0435 10/23/23 0106 10/25/23 0312 11/05/23 2240 11/07/23 0408  12/02/23 0850  NA 138 134* 141 138 147* 139  K 4.2 4.2 4.2 3.8 3.8 4.2  CL 112* 108 109 110 121* 112*  CO2 18* 17* 25 19* 20* 20  GLUCOSE 111* 117* 120* 124* 109*  --   BUN 38* 39* 29* 36* 18 21  CREATININE 0.64 0.59 0.62 0.95 0.83 0.9  CALCIUM 10.0 10.1 10.4* 11.6* 11.0* 10.7  MG 2.2 2.1 2.5*  --   --   --   PHOS 2.5 2.9 3.2  --   --   --    Recent Labs    10/20/23 0308 10/23/23 0106 11/05/23 2240 12/02/23 0850  AST 13* 17 20 14   ALT 9 11 15 8   ALKPHOS 100 93 86 66  BILITOT 0.2 0.2 0.3  --   PROT 4.8* 5.8* 6.1*  --   ALBUMIN  2.6* 3.2* 3.5 3.6   Recent Labs    10/26/23 0312 10/27/23 0247 11/05/23 2240 11/06/23 0606 11/06/23 2002 11/07/23 0408 11/08/23 0429 11/18/23 0850  WBC 6.8 6.8   < > 8.9  --  7.4 7.4 7.7  NEUTROABS 4.2 4.7  --   --   --   --   --  5,459.00  HGB 9.2* 8.8*   < >  9.3*   < > 8.6* 8.6* 11.2*  HCT 29.9* 28.8*   < > 29.4*   < > 27.4* 28.7* 34*  MCV 103.1* 103.6*   < > 103.2*  --  105.0* 105.5*  --   PLT 356 318   < > 248  --  236 246 351   < > = values in this interval not displayed.   Lab Results  Component Value Date   TSH 1.150 10/05/2023   No results found for: HGBA1C Lab Results  Component Value Date   CHOL 212 (H) 07/29/2023   HDL 62 07/29/2023   LDLCALC 121 (H) 07/29/2023   TRIG 65 10/20/2023   CHOLHDL 3.4 07/29/2023    Significant Diagnostic Results in last 30 days:  No results found.  Assessment/Plan UTI (urinary tract infection) The patient complaining of a burning sensation upon urination, denied urinary frequency, urgency, incontinence.  Denied lower abdomen pressure/pain/discomfort.  She is afebrile and in her usual state of health. UA C/S to rule out UTI 23 2025 urine culture pending, urinalysis showed positive nitrate, 3+ leukocyte esterase, packed WBC, many bacteria Empirical antibiotics Cipro  500 mg by mouth every 12 hours for 7 days     Family/ staff Communication: Plan of care reviewed with the patient and  charge nurse  Labs/tests ordered: UA C/S

## 2023-12-16 NOTE — Assessment & Plan Note (Addendum)
 The patient complaining of a burning sensation upon urination, denied urinary frequency, urgency, incontinence.  Denied lower abdomen pressure/pain/discomfort.  She is afebrile and in her usual state of health. UA C/S to rule out UTI 23 2025 urine culture pending, urinalysis showed positive nitrate, 3+ leukocyte esterase, packed WBC, many bacteria Empirical antibiotics Cipro  500 mg by mouth every 12 hours for 7 days

## 2023-12-17 DIAGNOSIS — N390 Urinary tract infection, site not specified: Secondary | ICD-10-CM | POA: Diagnosis not present

## 2023-12-19 MED ORDER — CIPROFLOXACIN HCL 500 MG PO TABS: 500.0000 mg | ORAL_TABLET | Freq: Two times a day (BID) | ORAL | 0 refills | Status: AC

## 2023-12-19 NOTE — Addendum Note (Signed)
 Addended by: ADLINE PARISIAN X on: 12/19/2023 12:31 PM   Modules accepted: Orders

## 2023-12-22 ENCOUNTER — Other Ambulatory Visit: Payer: Self-pay | Admitting: Nurse Practitioner

## 2023-12-22 DIAGNOSIS — F419 Anxiety disorder, unspecified: Secondary | ICD-10-CM

## 2023-12-22 DIAGNOSIS — F304 Manic episode in full remission: Secondary | ICD-10-CM

## 2023-12-22 MED ORDER — ALPRAZOLAM 0.5 MG PO TABS: 0.5000 mg | ORAL_TABLET | Freq: Three times a day (TID) | ORAL | 0 refills | Status: DC

## 2023-12-22 MED ORDER — LITHIUM CARBONATE 150 MG PO CAPS: 150.0000 mg | ORAL_CAPSULE | Freq: Three times a day (TID) | ORAL | 2 refills | Status: AC

## 2023-12-26 ENCOUNTER — Ambulatory Visit: Admitting: Gastroenterology

## 2023-12-31 ENCOUNTER — Non-Acute Institutional Stay (SKILLED_NURSING_FACILITY): Payer: Self-pay | Admitting: Family Medicine

## 2023-12-31 DIAGNOSIS — I480 Paroxysmal atrial fibrillation: Secondary | ICD-10-CM | POA: Diagnosis not present

## 2023-12-31 DIAGNOSIS — N1831 Chronic kidney disease, stage 3a: Secondary | ICD-10-CM

## 2023-12-31 DIAGNOSIS — F319 Bipolar disorder, unspecified: Secondary | ICD-10-CM | POA: Diagnosis not present

## 2023-12-31 NOTE — Assessment & Plan Note (Addendum)
 Most recent GFR of 58.  Recent hospitalization shows no decline in renal function

## 2023-12-31 NOTE — Progress Notes (Unsigned)
 Provider:  Garnette Pinal, MD Location:      Place of Service:     PCP: Mast, Man X, NP Patient Care Team: Mast, Man X, NP as PCP - General (Internal Medicine) Devonna Sieving, MD as Consulting Physician (Gynecologic Oncology) Cloretta Arley NOVAK, MD as Consulting Physician (Oncology) Luis Purchase, MD as Consulting Physician (Gastroenterology) Octavia Charleston, MD as Consulting Physician (Ophthalmology) Pinal Fairy POUR, MD as Referring Physician (Neurology) Tat, Asberry RAMAN, DO as Consulting Physician (Neurology)  Extended Emergency Contact Information Primary Emergency Contact: Corlis Sieving Mulders Address: [address]          [place], [place] [address] [place] Home Phone: [phone] Mobile Phone: [phone] Relation: Son Secondary Emergency Contact: Corlis Comer Numbers Home Phone: [phone] Mobile Phone: [phone] Relation: Relative  Code Status:  Goals of Care: Advanced Directive information    12/16/2023    2:04 PM  Advanced Directives  Does Patient Have a Medical Advance Directive? Yes  Type of Estate Agent of [place];Out of facility DNR (pink MOST or yellow form)  Does patient want to make changes to medical advance directive? No - Patient declined  Copy of Healthcare Power of Attorney in Chart? Yes - validated most recent copy scanned in chart (See row information)  Pre-existing out of facility DNR order (yellow form or pink MOST form) Yellow form placed in chart (order not valid for inpatient use)     HPI: Patient is a 83 y.o. female seen today at her request.  It seems like her chief complaint is she is not getting care that she thinks she needs either in terms of therapy or ADLs.  She is in hospice care and they also today requested that we stop nonessential medicines.  She has a history of bipolar disease which is managed with lithium , topiramate , and Xanax .  Per nursing staff, those symptoms seem to be  well-controlled. I am not sure Jori realizes she is on hospice but I explained that on hospice some medicines are discontinued as well as some therapies like physical therapy or or Occupational Therapy.  She does have local family that it is probably probably involved in management and decision making.  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, possible    Tremors possible parkinsons   Manic disorder, single episode, in full remission 12/01/2009   Sixth nerve palsy    Past Surgical History:  Procedure Laterality Date   BILATERAL SALPINGOOPHORECTOMY  09/22/2007   CATARACT EXTRACTION Bilateral    ESOPHAGOGASTRODUODENOSCOPY (EGD) WITH PROPOFOL  N/A 03/25/2022   Procedure: ESOPHAGOGASTRODUODENOSCOPY (EGD) WITH PROPOFOL ;  Surgeon: Legrand Victory LITTIE DOUGLAS, MD;  Location: [facility];  Service: Gastroenterology;  Laterality: N/A;   Gastrointestinal Stroma Tumor,  Other  1960   GIST Surgery   ILEOCECETOMY  09/22/2007   LAPAROSCOPIC LYSIS OF ADHESIONS N/A 10/07/2023   Procedure: LYSIS, ADHESIONS, LAPAROSCOPIC;  Surgeon: Dasie Best  L, MD;  Location: [facility];  Service: General;  Laterality: N/A;   LAPAROSCOPY N/A 10/07/2023   Procedure: DIAGNOSTIC LAPAROSCOPY, EXPLORATORY LAPAROTOMY WITH LYSIS OF ADHESIONS, REPAIR OF SMALL BOWEL ENTEROTOMY;  Surgeon: Dasie Leonor CROME, MD;  Location: [facility];  Service: General;  Laterality: N/A;   OVARIAN CYST REMOVAL Right  1967   SMALL INTESTINE SURGERY  09/22/2007   TOTAL KNEE ARTHROPLASTY Right 10/05/2019   Procedure: RIGHT TOTAL KNEE ARTHROPLASTY;  Surgeon: Addie Cordella Hamilton, MD;  Location: [facility];  Service: Orthopedics;  Laterality: Right;   TOTAL VAGINAL HYSTERECTOMY  1986   Fibroids    reports that she has never smoked. She has never been exposed to tobacco smoke. She has never used smokeless tobacco. She reports that she does not drink alcohol  and does not use drugs. Social History   Socioeconomic History   Marital status: Widowed    Spouse name: Not on file   Number of children: 2   Years of education: Bachelors   Highest education level: Not on file  Occupational History   Occupation: retired    Comment: made wedding dressings  Tobacco Use   Smoking status: Never    Passive exposure: Never   Smokeless tobacco: Never  Vaping Use   Vaping status: Never Used  Substance and Sexual Activity   Alcohol  use: No   Drug use: No   Sexual activity: Not Currently    Birth control/protection: Post-menopausal  Other Topics Concern   Not on file  Social History Narrative   Lives at home alone.   Right-handed.   No caffeine use.      Diet: Eats anything, like fruits, vegetables, regular diet      Do you drink/ eat things with caffeine? No      Marital status:  Widowed                             What year were you married ? 1965      Do you live in a house, apartment,assistred living, condo, trailer, etc.)? Apartment      Is it one or more stories?  One Story      How many persons live in your home ? 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  No                            Type & how often       ADVANCED DIRECTIVES (Please bring copies)      Do you have a living will? Yes      Do you have a DNR form? Yes                      If  not, do you want to discuss one?       Do you have signed POA?HPOA forms?  Yes               If so, please bring to your appointment      FUNCTIONAL STATUS- To be completed by Spouse / child / Staff       Do you have difficulty bathing or dressing  yourself ? No   Do you have difficulty preparing food or eating ? No      Do you have difficulty managing your mediation ? No      Do you have difficulty managing your finances ? No      Do you have difficulty affording your medication ? No      Social Drivers of Health   Tobacco Use: Low Risk (12/16/2023)   Patient History    Smoking Tobacco Use: Never    Smokeless Tobacco Use: Never    Passive Exposure: Never  Financial Resource Strain: Not on file  Food Insecurity: No Food Insecurity (11/06/2023)   Epic    Worried About Programme Researcher, Broadcasting/film/video in the Last Year: Never true    Ran Out of Food in the Last Year: Never true  Transportation Needs: No Transportation Needs (11/06/2023)   Epic    Lack of Transportation (Medical): No    Lack of Transportation (Non-Medical): No  Physical Activity: Not on file  Stress: Not on file  Social Connections: Moderately Integrated (11/06/2023)   Social Connection and Isolation Panel    Frequency of Communication with Friends and Family: Twice a week    Frequency of Social Gatherings with Friends and Family: Once a week    Attends Religious Services: 1 to 4 times per year    Active Member of [employer] or Organizations: Yes    Attends Banker Meetings: 1 to 4 times per year    Marital Status: Widowed  Intimate Partner Violence: Not At Risk (11/06/2023)   Epic    Fear of Current or Ex-Partner: No    Emotionally Abused: No    Physically Abused: No    Sexually Abused: No  Depression (PHQ2-9): Low Risk (10/02/2023)   Depression (PHQ2-9)    PHQ-2 Score: 0  Alcohol  Screen: Not on file  Housing: Low Risk (11/06/2023)   Epic    Unable to Pay for Housing in the Last Year: No    Number of Times  Moved in the Last Year: 1    Homeless in the Last Year: No  Utilities: Patient Unable To Answer (11/06/2023)   Epic    Threatened with loss of utilities: Patient unable to answer  Health Literacy: Not on file    Functional Status Survey:    Family History  Problem Relation Age of Onset   Congestive Heart Failure Mother    Dementia Mother    Heart disease Father    Diabetes Father    Lung cancer Son    Liver disease Neg Hx    Esophageal cancer Neg Hx    Colon cancer Neg Hx     Health Maintenance  Topic Date Due   Influenza Vaccine  08/15/2023   COVID-19 Vaccine (6 - 2025-26 season) 09/15/2023   DTaP/Tdap/Td (3 - Td or Tdap) 04/17/2033   Pneumococcal Vaccine: 50+ Years  Completed   Bone Density Scan  Completed   Zoster Vaccines- Shingrix  Completed   Meningococcal B Vaccine  Aged Out   Mammogram  Discontinued    Allergies[1]  Outpatient Encounter Medications as of 12/31/2023  Medication Sig   acetaminophen  (TYLENOL ) 325 MG tablet Take 650 mg by mouth every 4 (four) hours as needed (for pain).   ALPRAZolam  (XANAX ) 0.5 MG tablet Take 1 tablet (0.5 mg total) by mouth 3 (three) times daily.   amiodarone  (PACERONE ) 100 MG tablet Take 1 tablet (100 mg total) by mouth daily.   apixaban  (ELIQUIS )  2.5 MG TABS tablet Take 1 tablet (2.5 mg total) by mouth 2 (two) times daily.   cycloSPORINE  (RESTASIS ) 0.05 % ophthalmic emulsion Place 1 drop into both eyes 2 (two) times daily.   lactose free nutrition (BOOST) LIQD Take 237 mLs by mouth in the morning, at noon, and at bedtime.   latanoprost  (XALATAN ) 0.005 % ophthalmic solution Place 1 drop into both eyes at bedtime.   lidocaine  (LIDODERM ) 5 % Place 1 patch onto the skin daily. Remove & Discard patch within 12 hours or as directed by MD   lithium  carbonate 150 MG capsule Take 1 capsule (150 mg total) by mouth 3 (three) times daily with meals.   Multiple Vitamin (MULTIVITAMIN WITH MINERALS) TABS tablet Take 1 tablet by mouth daily.    ondansetron  (ZOFRAN ) 4 MG tablet Take 4 mg by mouth every 6 (six) hours as needed for nausea.   pantoprazole  (PROTONIX ) 40 MG tablet Take 1 tablet (40 mg total) by mouth 2 (two) times daily.   polyethylene glycol (MIRALAX  / GLYCOLAX ) 17 g packet Take 17 g by mouth every other day.   pravastatin  (PRAVACHOL ) 10 MG tablet Take 5 mg by mouth every evening.   SYSTANE 0.4-0.3 % SOLN Place 1 drop into both eyes every 4 (four) hours as needed (for dryness).   Timolol  Maleate, Once-Daily, 0.5 % SOLN Place 1 drop into both eyes daily.   topiramate  (TOPAMAX ) 25 MG tablet TAKE ONE TABLET BY MOUTH TWICE DAILY IN THE MORNING AND IN THE EVENING (Patient taking differently: Take 25 mg by mouth 2 (two) times daily.)   White Petrolatum-Mineral Oil (SYSTANE NIGHTTIME) OINT Place 1 Application into both eyes at bedtime.   No facility-administered encounter medications on file as of 12/31/2023.    Review of Systems  Constitutional:  Positive for fatigue.  HENT: Negative.    Respiratory: Negative.    Cardiovascular: Negative.   Gastrointestinal: Negative.   Musculoskeletal:  Positive for gait problem.  Neurological:  Positive for weakness.  Psychiatric/Behavioral:  The patient is nervous/anxious.     There were no vitals filed for this visit. There is no height or weight on file to calculate BMI. Physical Exam Vitals and nursing note reviewed.  Constitutional:      Appearance: Normal appearance.  Neurological:     Mental Status: She is alert.     Labs reviewed: Basic Metabolic Panel: Recent Labs    10/22/23 0435 10/23/23 0106 10/25/23 0312 11/05/23 2240 11/07/23 0408 12/02/23 0850  NA 138 134* 141 138 147* 139  K 4.2 4.2 4.2 3.8 3.8 4.2  CL 112* 108 109 110 121* 112*  CO2 18* 17* 25 19* 20* 20  GLUCOSE 111* 117* 120* 124* 109*  --   BUN 38* 39* 29* 36* 18 21  CREATININE 0.64 0.59 0.62 0.95 0.83 0.9  CALCIUM 10.0 10.1 10.4* 11.6* 11.0* 10.7  MG 2.2 2.1 2.5*  --   --   --   PHOS 2.5 2.9  3.2  --   --   --    Liver Function Tests: Recent Labs    10/20/23 0308 10/23/23 0106 11/05/23 2240 12/02/23 0850  AST 13* 17 20 14   ALT 9 11 15 8   ALKPHOS 100 93 86 66  BILITOT 0.2 0.2 0.3  --   PROT 4.8* 5.8* 6.1*  --   ALBUMIN  2.6* 3.2* 3.5 3.6   Recent Labs    11/05/23 2240  LIPASE 30   No results for input(s): AMMONIA in the last  8760 hours. CBC: Recent Labs    10/26/23 0312 10/27/23 0247 11/05/23 2240 11/06/23 0606 11/06/23 2002 11/07/23 0408 11/08/23 0429 11/18/23 0850  WBC 6.8 6.8   < > 8.9  --  7.4 7.4 7.7  NEUTROABS 4.2 4.7  --   --   --   --   --  5,459.00  HGB 9.2* 8.8*   < > 9.3*   < > 8.6* 8.6* 11.2*  HCT 29.9* 28.8*   < > 29.4*   < > 27.4* 28.7* 34*  MCV 103.1* 103.6*   < > 103.2*  --  105.0* 105.5*  --   PLT 356 318   < > 248  --  236 246 351   < > = values in this interval not displayed.   Cardiac Enzymes: No results for input(s): CKTOTAL, CKMB, CKMBINDEX, TROPONINI in the last 8760 hours. BNP: Invalid input(s): POCBNP No results found for: HGBA1C Lab Results  Component Value Date   TSH 1.150 10/05/2023   Lab Results  Component Value Date   VITAMINB12 646 11/06/2023   Lab Results  Component Value Date   FOLATE 20.0 11/06/2023   Lab Results  Component Value Date   IRON  48 11/06/2023   TIBC 249 (L) 11/06/2023   FERRITIN 233 11/06/2023    Imaging and Procedures obtained prior to SNF admission: CT HEAD WO CONTRAST ( ) Result Date: 11/06/2023 EXAM: CT HEAD WITHOUT CONTRAST 11/06/2023 08:27:58 AM TECHNIQUE: CT of the head was performed without the administration of intravenous contrast. Automated exposure control, iterative reconstruction, and/or weight based adjustment of the mA/kV was utilized to reduce the radiation dose to as low as reasonably achievable. COMPARISON: Brain MRI 01/13/2020. Head CT 10/16/2021. CLINICAL HISTORY: Delirium. FINDINGS: BRAIN AND VENTRICLES: No acute hemorrhage. No evidence of acute infarct.  No hydrocephalus. No extra-axial collection. No mass effect or midline shift. Volume remains within normal limits for age. Minimal chronic white matter hypodensity. No suspicious intracranial vascular hyperdensity. ORBITS: No acute abnormality. SINUSES: No acute abnormality. SOFT TISSUES AND SKULL: No acute soft tissue abnormality. No skull fracture. IMPRESSION: 1. Negative for age non-contrast head CT. Electronically signed by: Helayne Hurst MD 11/06/2023 08:36 AM EDT RP Workstation: [id number]   CT ABDOMEN PELVIS W CONTRAST Result Date: 11/06/2023 CLINICAL DATA:  Abdominal pain with cough and ground emesis EXAM: CT ABDOMEN AND PELVIS WITH CONTRAST TECHNIQUE: Multidetector CT imaging of the abdomen and pelvis was performed using the standard protocol following bolus administration of intravenous contrast. RADIATION DOSE REDUCTION: This exam was performed according to the departmental dose-optimization program which includes automated exposure control, adjustment of the mA and/or kV according to patient size and/or use of iterative reconstruction technique. CONTRAST:  80mL OMNIPAQUE  IOHEXOL  300 MG/ML  SOLN COMPARISON:  10/24/2023 FINDINGS: Lower chest: Small right-sided pleural effusion is seen new from the prior exam. Sliding-type hiatal hernia is noted. Hepatobiliary: Scattered cysts are seen within the liver. The gallbladder is within normal limits. Pancreas: Unremarkable. No pancreatic ductal dilatation or surrounding inflammatory changes. Spleen: Normal in size without focal abnormality. Adrenals/Urinary Tract: Adrenal glands are well visualized and within normal limits. Kidneys demonstrate a normal enhancement pattern. Scattered cysts are noted within the left kidney. No follow-up is recommended. The bladder is well distended. Previously seen air within the bladder has resolved. Stomach/Bowel: Changes consistent with prior ileocecectomy are again noted. Hiatal hernia is noted. Stomach is otherwise within  normal limits. No small bowel abnormality is noted. Colon shows no obstructive changes. Vascular/Lymphatic: Aortic atherosclerosis. No enlarged  abdominal or pelvic lymph nodes. Reproductive: Status post hysterectomy. No adnexal masses. Other: Previously seen pelvic fluid collection has resolved. Small seroma is noted along the anterior abdominal wound site measuring up to 15 mm again decreased in size prior exam. No new focal fluid collection is seen. Musculoskeletal: Degenerative changes of lumbar spine are noted. IMPRESSION: Sliding-type hiatal hernia. Postsurgical changes in the colon without obstructive change. Further decrease in size of abdominal wound seroma anteriorly. Small right-sided pleural effusion new from the prior study. Electronically Signed   By: Oneil Devonshire M.D.   On: 11/06/2023 01:12   DG Chest Port 1 View Result Date: 11/06/2023 CLINICAL DATA:  Vomiting EXAM: PORTABLE CHEST 1 VIEW COMPARISON:  10/08/2023 FINDINGS: The heart size and mediastinal contours are within normal limits. Both lungs are clear. The visualized skeletal structures are unremarkable. IMPRESSION: No active disease. Electronically Signed   By: Luke Bun M.D.   On: 11/06/2023 00:02    Assessment/Plan Assessment & Plan Stage 3a chronic kidney disease (HCC) Most recent GFR of 58.  Recent hospitalization shows no decline in renal function Paroxysmal atrial fibrillation (HCC) Symptoms managed with amiodarone  and apixaban  for anticoagulation Bipolar affective disorder, remission status unspecified (HCC) No relief but patient should stay on her medicines for bipolar disorder namely lithium  topiramate  and Xanax  indefinitely  Family/ staff Communication: no  Labs/tests ordered:  .smmsig     [1]  Allergies Allergen Reactions   Lisinopril Cough   Penicillins Itching and Other (See Comments)    50 years ago   Atorvastatin Itching   Dilaudid  [Hydromorphone  Hcl] Itching   Tape Itching

## 2023-12-31 NOTE — Assessment & Plan Note (Addendum)
 Symptoms managed with amiodarone  and apixaban  for anticoagulation

## 2023-12-31 NOTE — Assessment & Plan Note (Addendum)
 No relief but patient should stay on her medicines for bipolar disorder namely lithium  topiramate  and Xanax  indefinitely

## 2024-01-07 ENCOUNTER — Encounter: Payer: Self-pay | Admitting: Nurse Practitioner

## 2024-01-07 ENCOUNTER — Non-Acute Institutional Stay (SKILLED_NURSING_FACILITY): Payer: Self-pay | Admitting: Nurse Practitioner

## 2024-01-07 DIAGNOSIS — Z0000 Encounter for general adult medical examination without abnormal findings: Secondary | ICD-10-CM | POA: Diagnosis not present

## 2024-01-07 NOTE — Progress Notes (Signed)
 "  Chief Complaint  Patient presents with   Medicare Wellness     Subjective:   Tracey [name] is a 83 y.o. female who presents for a Medicare Annual Wellness Visit @ [facility]  Visit info / Clinical Intake: Medicare Wellness Visit Type:: Subsequent Annual Wellness Visit Persons participating in visit and providing information:: patient Medicare Wellness Visit Mode:: In-person (required for WTM) Interpreter Needed?: No Pre-visit prep was completed: yes AWV questionnaire completed by patient prior to visit?: no Living arrangements:: in nursing facility Patient's Overall Health Status Rating: (!) fair Typical amount of pain: some Does pain affect daily life?: no Are you currently prescribed opioids?: no  Dietary Habits and Nutritional Risks How many meals a day?: 3 Eats fruit and vegetables daily?: yes Most meals are obtained by: having others provide food In the last 2 weeks, have you had any of the following?: none Diabetic:: no  Functional Status Activities of Daily Living (to include ambulation/medication): (!) Needs Assist Feeding: Independent Dressing/Grooming: Needs assistance Bathing: Needs assistance Toileting: Independent Transfer: Needs assistance Ambulation: Independent with device- listed below Home Assistive Devices/Equipment: Wheelchair Medication Administration: Dependent Is this a change from baseline?: Pre-admission baseline Home Management (perform basic housework or laundry): Dependent Manage your own finances?: (!) no Primary transportation is: facility / other Concerns about vision?: no *vision screening is required for WTM* Concerns about hearing?: no  Fall Screening Falls in the past year?: 1 Number of falls in past year: 1 Was there an injury with Fall?: 0 Fall Risk Category Calculator: 2 Patient Fall Risk Level: Moderate Fall Risk  Fall Risk Patient at Risk for Falls Due to: History of fall(s); Impaired balance/gait; Impaired mobility;  Mental status change Fall risk Follow up: Falls evaluation completed; Education provided; Falls prevention discussed  Home and Transportation Safety: All rugs have non-skid backing?: yes All stairs or steps have railings?: yes Grab bars in the bathtub or shower?: yes Have non-skid surface in bathtub or shower?: yes Good home lighting?: yes Regular seat belt use?: yes Hospital stays in the last year:: (!) yes How many hospital stays:: 3  Cognitive Assessment Difficulty concentrating, remembering, or making decisions? : yes Will 6CIT or Mini Cog be Completed: yes  Advance Directives (For Healthcare) Does Patient Have a Medical Advance Directive?: Yes Does patient want to make changes to medical advance directive?: Yes (ED - Information included in AVS) Type of Advance Directive: Healthcare Power of [place]; Living will; Out of facility DNR (pink MOST or yellow form) Copy of Healthcare Power of Attorney in Chart?: Yes - validated most recent copy scanned in chart (See row information) Copy of Living Will in Chart?: Yes - validated most recent copy scanned in chart (See row information) Out of facility DNR (pink MOST or yellow form) in Chart? (Ambulatory ONLY): Yes - validated most recent copy scanned in chart Pre-existing out of facility DNR order (yellow form or pink MOST form): Pink MOST/Yellow Form most recent copy in chart - Physician notified to receive inpatient order  Reviewed/Updated  Reviewed/Updated: Reviewed All (Medical, Surgical, Family, Medications, Allergies, Care Teams, Patient Goals)    Allergies (verified) Lisinopril, Penicillins, Atorvastatin, Dilaudid  [hydromorphone  hcl], and Tape   Current Medications (verified) Outpatient Encounter Medications as of 01/07/2024  Medication Sig   acetaminophen  (TYLENOL ) 325 MG tablet Take 650 mg by mouth every 4 (four) hours as needed (for pain). (Patient taking differently: Take 650 mg by mouth 2 (two) times daily. Give 2  tablet by mouth two times a day  for pain)   ALPRAZolam  (XANAX ) 0.5 MG tablet Take 1 tablet (0.5 mg total) by mouth 3 (three) times daily.   amiodarone  (PACERONE ) 100 MG tablet Take 1 tablet (100 mg total) by mouth daily.   apixaban  (ELIQUIS ) 2.5 MG TABS tablet Take 1 tablet (2.5 mg total) by mouth 2 (two) times daily.   cycloSPORINE  (RESTASIS ) 0.05 % ophthalmic emulsion Place 1 drop into both eyes 2 (two) times daily.   latanoprost  (XALATAN ) 0.005 % ophthalmic solution Place 1 drop into both eyes at bedtime.   lidocaine  (LIDODERM ) 5 % Place 1 patch onto the skin daily. Remove & Discard patch within 12 hours or as directed by MD   lithium  carbonate 150 MG capsule Take 1 capsule (150 mg total) by mouth 3 (three) times daily with meals. (Patient taking differently: Take 150 mg by mouth 2 (two) times daily. Give 1 capsule by mouth two times a day for Bipolar Administer with meals)   Menthol , Topical Analgesic, (BIOFREEZE) 10 % CREA Apply topically. Apply to Left Healed Shingles topically four times a day for Pain   Multiple Vitamin (MULTIVITAMIN WITH MINERALS) TABS tablet Take 1 tablet by mouth daily.   ondansetron  (ZOFRAN ) 4 MG tablet Take 4 mg by mouth every 6 (six) hours as needed for nausea.   polyethylene glycol (MIRALAX  / GLYCOLAX ) 17 g packet Take 17 g by mouth every other day.   SYSTANE 0.4-0.3 % SOLN Place 1 drop into both eyes every 4 (four) hours as needed (for dryness).   Timolol  Maleate, Once-Daily, 0.5 % SOLN Place 1 drop into both eyes daily.   topiramate  (TOPAMAX ) 25 MG tablet TAKE ONE TABLET BY MOUTH TWICE DAILY IN THE MORNING AND IN THE EVENING (Patient taking differently: Take 25 mg by mouth 2 (two) times daily.)   White Petrolatum-Mineral Oil (SYSTANE NIGHTTIME) OINT Place 1 Application into both eyes at bedtime.   lactose free nutrition (BOOST) LIQD Take 237 mLs by mouth in the morning, at noon, and at bedtime.   [DISCONTINUED] pantoprazole  (PROTONIX ) 40 MG tablet Take 1 tablet  (40 mg total) by mouth 2 (two) times daily. (Patient not taking: Reported on 01/07/2024)   [DISCONTINUED] pravastatin  (PRAVACHOL ) 10 MG tablet Take 5 mg by mouth every evening. (Patient not taking: Reported on 01/07/2024)   No facility-administered encounter medications on file as of 01/07/2024.    History: 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, possible    Tremors possible parkinsons   Manic disorder, single episode, in full remission 12/01/2009   Sixth nerve palsy    Past Surgical History:  Procedure Laterality Date   BILATERAL SALPINGOOPHORECTOMY  09/22/2007   CATARACT EXTRACTION Bilateral    ESOPHAGOGASTRODUODENOSCOPY (EGD) WITH PROPOFOL  N/A 03/25/2022   Procedure: ESOPHAGOGASTRODUODENOSCOPY (EGD) WITH PROPOFOL ;  Surgeon: Legrand Victory LITTIE DOUGLAS, MD;  Location: [facility];  Service: Gastroenterology;  Laterality: N/A;   Gastrointestinal Stroma Tumor,  Other  1960   GIST Surgery   ILEOCECETOMY  09/22/2007   LAPAROSCOPIC LYSIS OF ADHESIONS N/A 10/07/2023   Procedure: LYSIS, ADHESIONS, LAPAROSCOPIC;  Surgeon: Dasie Leonor CROME, MD;  Location: [facility];  Service: General;  Laterality: N/A;   LAPAROSCOPY N/A 10/07/2023   Procedure: DIAGNOSTIC LAPAROSCOPY, EXPLORATORY LAPAROTOMY WITH LYSIS OF ADHESIONS, REPAIR OF SMALL BOWEL ENTEROTOMY;  Surgeon: Dasie Leonor CROME, MD;  Location: [facility];  Service: General;  Laterality: N/A;   OVARIAN CYST REMOVAL  Right 1967   SMALL INTESTINE SURGERY  09/22/2007   TOTAL KNEE ARTHROPLASTY Right 10/05/2019   Procedure: RIGHT TOTAL KNEE ARTHROPLASTY;  Surgeon: Addie Cordella Hamilton, MD;  Location: [facility];  Service: Orthopedics;  Laterality: Right;   TOTAL VAGINAL HYSTERECTOMY  1986   Fibroids   Family History  Problem Relation Age of Onset   Congestive Heart Failure Mother    Dementia Mother    Heart disease Father    Diabetes Father    Lung cancer Son    Liver disease Neg Hx    Esophageal cancer Neg Hx    Colon cancer Neg Hx    Social History   Occupational History   Occupation: retired    Comment: made wedding dressings  Tobacco Use   Smoking status: Never    Passive exposure: Never   Smokeless tobacco: Never  Vaping Use   Vaping status: Never Used  Substance and Sexual Activity   Alcohol  use: No   Drug use: No   Sexual activity: Not Currently    Birth control/protection: Post-menopausal   Tobacco Counseling Counseling given: Not Answered  SDOH Screenings   Food Insecurity: No Food Insecurity (11/06/2023)  Housing: Low Risk (11/06/2023)  Transportation Needs: No Transportation Needs (11/06/2023)  Utilities: Patient Unable To Answer (11/06/2023)  Depression (PHQ2-9): Low Risk (10/02/2023)  Social Connections: Moderately Integrated (11/06/2023)  Tobacco Use: Low Risk (01/07/2024)   See flowsheets for full screening details  Depression Screen PHQ 2 & 9 Depression Scale- Over the past 2 weeks, how often have you been bothered by any of the following problems? Little interest or pleasure in doing things: 0 Feeling down, depressed, or hopeless (PHQ Adolescent also includes...irritable): 0 PHQ-2 Total Score: 0     Goals Addressed   None          Objective:    Today's Vitals   01/07/24 1015  BP: 115/63  Pulse: (!) 46  Resp: 18  Temp: (!) 97.2 F (36.2 C)  SpO2: 98%  Weight: 110 lb 4.8 oz (50 kg)  Height: 5' 2 (1.575 m)   Body mass index is 20.17  kg/m.  Hearing/Vision screen No results found. Immunizations and Health Maintenance Health Maintenance  Topic Date Due   Influenza Vaccine  08/15/2023   COVID-19 Vaccine (6 - 2025-26 season) 09/15/2023   DTaP/Tdap/Td (3 - Td or Tdap) 04/17/2033   Pneumococcal Vaccine: 50+ Years  Completed   Bone Density Scan  Completed   Zoster Vaccines- Shingrix  Completed   Meningococcal B Vaccine  Aged Out   Mammogram  Discontinued        Assessment/Plan:  This is a routine wellness examination for Tracey [name].  Patient Care Team: Haze Antillon X, NP as PCP - General (Internal Medicine) Devonna Sieving, MD as Consulting Physician (Gynecologic Oncology) Cloretta Arley NOVAK, MD as Consulting Physician (Oncology) Luis Purchase, MD as Consulting Physician (Gastroenterology) Octavia Charleston, MD as Consulting Physician (Ophthalmology) Cleotilde Fairy POUR, MD as Referring Physician (Neurology) Tat, Asberry RAMAN, DO as  Consulting Physician (Neurology)  I have personally reviewed and noted the following in the patients chart:   Medical and social history Use of alcohol , tobacco or illicit drugs  Current medications and supplements including opioid prescriptions. Functional ability and status Nutritional status Physical activity Advanced directives List of other physicians Hospitalizations, surgeries, and ER visits in previous 12 months Vitals Screenings to include cognitive, depression, and falls Referrals and appointments  No orders of the defined types were placed in this encounter.  In addition, I have reviewed and discussed with patient certain preventive protocols, quality metrics, and best practice recommendations. A written personalized care plan for preventive services as well as general preventive health recommendations were provided to patient.   Christopherjame Carnell X Breanna Shorkey, NP   01/12/2024   Return in 1 year (on 01/06/2025).  After Visit Summary: (In Person-Declined) Patient declined AVS at this  time.   "

## 2024-01-12 ENCOUNTER — Encounter: Payer: Self-pay | Admitting: Nurse Practitioner

## 2024-01-22 ENCOUNTER — Other Ambulatory Visit: Payer: Self-pay | Admitting: Nurse Practitioner

## 2024-01-22 DIAGNOSIS — F419 Anxiety disorder, unspecified: Secondary | ICD-10-CM

## 2024-01-22 MED ORDER — ALPRAZOLAM 0.5 MG PO TABS: 0.5000 mg | ORAL_TABLET | Freq: Three times a day (TID) | ORAL | 2 refills | Status: AC

## 2024-02-18 ENCOUNTER — Non-Acute Institutional Stay: Payer: Self-pay | Admitting: Nurse Practitioner

## 2024-02-18 ENCOUNTER — Encounter: Payer: Self-pay | Admitting: Nurse Practitioner

## 2024-02-18 DIAGNOSIS — I10 Essential (primary) hypertension: Secondary | ICD-10-CM

## 2024-02-18 DIAGNOSIS — K219 Gastro-esophageal reflux disease without esophagitis: Secondary | ICD-10-CM

## 2024-02-18 DIAGNOSIS — F0390 Unspecified dementia without behavioral disturbance: Secondary | ICD-10-CM

## 2024-02-18 DIAGNOSIS — K5901 Slow transit constipation: Secondary | ICD-10-CM

## 2024-02-18 DIAGNOSIS — E785 Hyperlipidemia, unspecified: Secondary | ICD-10-CM

## 2024-02-18 DIAGNOSIS — F319 Bipolar disorder, unspecified: Secondary | ICD-10-CM

## 2024-02-18 DIAGNOSIS — L84 Corns and callosities: Secondary | ICD-10-CM

## 2024-02-18 DIAGNOSIS — I480 Paroxysmal atrial fibrillation: Secondary | ICD-10-CM

## 2024-02-18 DIAGNOSIS — G243 Spasmodic torticollis: Secondary | ICD-10-CM

## 2024-02-18 DIAGNOSIS — R1312 Dysphagia, oropharyngeal phase: Secondary | ICD-10-CM

## 2024-02-18 NOTE — Assessment & Plan Note (Signed)
 Cervical degenerative changes/dystonia, better, CT/MRI 12/2019 ruled out acute process, aches in neck R>L travels to the back of head, comes and goes, not disabling, f/u Neurosurgery. Failed Cymbalta , Gabapentin , Robaxin , Ultracet . s/p Botulinum Toxin inj by Neurology. ESR 9, CRP 2.7 10/09/20(ESR 6 07/30/22). The patient stated Alprazolam  and Tylenol  are kind of effective. Improved on Topamax, but associated with weight loss.  No Parkinson's disease, took her off Sinemet  per Neurology. Currently taking inj

## 2024-02-18 NOTE — Assessment & Plan Note (Signed)
 12/2019 MRI brain showed chronic microvascular ischemic changes, no acute disease with mild cortical atrophy, TSH 1.56 07/29/23

## 2024-02-18 NOTE — Assessment & Plan Note (Signed)
 stable, on Lithium , GDR due to bradycardia, takes Alprazolam  too, TSH 1.15 10/05/23. Declined Dr Cleotilde started low dose of Depakote . Lithium  level 0.4 07/30/22

## 2024-02-18 NOTE — Assessment & Plan Note (Signed)
 GERD/Hx of UGIB, stable, on pantoprazole , Hgb 11.2 11/18/23

## 2024-02-18 NOTE — Assessment & Plan Note (Signed)
GI evaluated, EGD 03/25/22 wnl, working with ST, thicken liquids.

## 2024-02-18 NOTE — Assessment & Plan Note (Signed)
 blood pressure is controlled off Losartan , Bun/creat 21/0.9 12/02/23

## 2024-02-18 NOTE — Assessment & Plan Note (Signed)
 Heart rate is in control New onset of A-fib, follow-up cardiology, on amiodarone , resumed Eliquis 10/31/25after UGIB resolved.

## 2024-02-18 NOTE — Assessment & Plan Note (Signed)
"   f/u endocrinology, Ca 10.3 04/18/23, 10.4 10/25/23, 01/12/20<<10.6 08/06/23,  PTH 79 01/12/20. GDR of Lithium  may help "

## 2024-02-18 NOTE — Progress Notes (Unsigned)
 " Location:  [facility] Room Number: N065-A Place of Service:  SNF (31) Provider:  Matthe Sloane X, NP  Patient Care Team: Sherlock Nancarrow X, NP as PCP - General (Internal Medicine) Devonna Sieving, MD as Consulting Physician (Gynecologic Oncology) Cloretta Arley NOVAK, MD as Consulting Physician (Oncology) Luis Purchase, MD as Consulting Physician (Gastroenterology) Octavia Charleston, MD as Consulting Physician (Ophthalmology) Cleotilde Fairy POUR, MD as Referring Physician (Neurology) Tat, Asberry RAMAN, DO as Consulting Physician (Neurology)  Extended Emergency Contact Information Primary Emergency Contact: Corlis Sieving Mulders Address: [address]          [place], [place] [address] [place] Home Phone: [phone] Mobile Phone: [phone] Relation: Son Secondary Emergency Contact: Corlis Comer Numbers Home Phone: [phone] Mobile Phone: [phone] Relation: Relative  Code Status:  DNR Goals of care: Advanced Directive information    01/07/2024   12:05 PM  Advanced Directives  Does Patient Have a Medical Advance Directive? Yes  Type of Estate Agent of [place];Living will;Out of facility DNR (pink MOST or yellow form)  Does patient want to make changes to medical advance directive? Yes (ED - Information included in AVS)  Copy of Healthcare Power of Attorney in Chart? Yes - validated most recent copy scanned in chart (See row information)  Pre-existing out of facility DNR order (yellow form or pink MOST form) Pink MOST/Yellow Form most recent copy in chart - Physician notified to receive inpatient order     Chief Complaint  Patient presents with   Routine Visit    HPI:  Pt is a 84 y.o. female seen today for medical management of chronic diseases.       Hospitalized 11/05/23-11/08/23 AMS, Afib, UGIB, shingles of abd.              UGIB-HPOA declined GI f/u              Hospitalized 10/01/2023 to 10/27/2023 for small  bowel obstruction, status post laparoscopy, exploratory laparotomy with lysis of adhesions, repair of the small bowel enterotomy 10/07/2023.  Her hospital stay was complicated with with post operative ileus, postop anemia, pelvic abscess-completed antibiotics.             Hx of GIST of small intestine s/p resection. MiraLax , Psyllium              Post op anemia, received IV iron  in the hospital, Iron  49, Vit B12 931 10/20/23, Hgb 7.8 10/21/23, Hgb 11.2 11/18/23             Pelvic abscess completed ABD in hospital, noted on CT 10/13/23, cleared by surgeon upon discharge.              New onset of A-fib, follow-up cardiology, on amiodarone , resumed Eliquis  10/31/25after UGIB resolved.              Pulmonary nodule, 7 mm posterior right upper lobe, follow-up with noncontrast chest CT 6 to 108-month Dry mouth syndrome, resolved after dentist recommend mouth wash             Persisted running nose, facial pressure, sore throat, underwent ENT evaluation              Esophageal candidiasis, recurrent thrush identified on swallow study, FEES showed diffused white secretion coating areas of white plaques throughout pharynx. The patient c/o sore in buccal sides of tongue and in her throat when swallows. Treated with Diflucan  in the past, underwent GI evaluation, EGD 03/25/22 normal findings. Last treated  with Diflucan  300mg  x1 04/10/22, then followed 200mg  qd x1 wk, may repeat x1.                Edema, not apparent, hx of LLE negative DVT venous US ,  Varicose veins LLE not new.  Tinnitus, underwent eval of  ENT, suggested music background.                          Dysphagia, GI evaluated, EGD 03/25/22 wnl, working with ST, thicken liquids.              Constipation, Metamucil, MiraLax ,  effective.              GERD/Hx of UGIB, stable, on pantoprazole , Hgb 11.2 11/18/23             HTN, blood pressure is controlled off Losartan , Bun/creat 21/0.9 12/02/23             Bipolar disorder, stable, on Lithium , GDR due to  bradycardia, takes Alprazolam  too, TSH 1.15 10/05/23. Declined Dr Cleotilde started low dose of Depakote . Lithium  level 0.4 07/30/22             Bradycardia, improved from 40s to 50s. Hx of normalized HR after Tylenol  PM dc'd, GDR of Lithium  helped too. BNP 67.4 08/06/23             Hypercalcemia, f/u endocrinology, Ca 10.3 04/18/23, 10.4 10/25/23, 01/12/20<<10.6 08/06/23,  PTH 79 01/12/20. GDR of Lithium  may help            OA, takes  Tylenol , s/p R knee arthroplasty.             Cervical degenerative changes/dystonia, better, CT/MRI 12/2019 ruled out acute process, aches in neck R>L travels to the back of head, comes and goes, not disabling, f/u Neurosurgery. Failed Cymbalta , Gabapentin , Robaxin , Ultracet . s/p Botulinum Toxin inj by Neurology. ESR 9, CRP 2.7 10/09/20(ESR 6 07/30/22). The patient stated Alprazolam  and Tylenol  are kind of effective. Improved on Topamax , but associated with weight loss.  No Parkinson's disease, took her off Sinemet  per Neurology. Currently taking inj               Cognitive impairment: 12/2019 MRI brain showed chronic microvascular ischemic changes, no acute disease with mild cortical atrophy, TSH 1.56 07/29/23             Hyperlipidemia, LDL 121 07/29/23, on diet, tolerated Pravastatin         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, possible    Tremors possible parkinsons   Manic disorder, single episode, in full  remission 12/01/2009   Sixth nerve palsy    Past Surgical History:  Procedure Laterality Date   BILATERAL SALPINGOOPHORECTOMY  09/22/2007   CATARACT EXTRACTION Bilateral    ESOPHAGOGASTRODUODENOSCOPY (EGD) WITH PROPOFOL  N/A 03/25/2022   Procedure: ESOPHAGOGASTRODUODENOSCOPY (EGD) WITH PROPOFOL ;  Surgeon: Legrand Victory LITTIE DOUGLAS, MD;  Location: [facility];  Service: Gastroenterology;  Laterality: N/A;   Gastrointestinal Stroma Tumor,  Other  1960   GIST Surgery   ILEOCECETOMY  09/22/2007   LAPAROSCOPIC LYSIS OF ADHESIONS N/A 10/07/2023   Procedure: LYSIS, ADHESIONS, LAPAROSCOPIC;  Surgeon: Dasie Leonor LITTIE, MD;  Location: [facility];  Service: General;  Laterality: N/A;   LAPAROSCOPY N/A 10/07/2023   Procedure: DIAGNOSTIC LAPAROSCOPY, EXPLORATORY LAPAROTOMY WITH LYSIS OF ADHESIONS, REPAIR OF SMALL BOWEL ENTEROTOMY;  Surgeon: Dasie Leonor LITTIE, MD;  Location: [facility];  Service: General;  Laterality: N/A;   OVARIAN CYST REMOVAL Right 1967   SMALL INTESTINE SURGERY  09/22/2007   TOTAL KNEE ARTHROPLASTY Right 10/05/2019   Procedure: RIGHT TOTAL KNEE ARTHROPLASTY;  Surgeon: Addie Cordella Hamilton, MD;  Location: [facility];  Service: Orthopedics;  Laterality: Right;   TOTAL VAGINAL HYSTERECTOMY  1986   Fibroids    Allergies[1]  Outpatient Encounter Medications as of 02/18/2024  Medication Sig   acetaminophen  (TYLENOL ) 325 MG tablet Take 650 mg by mouth 2 (two) times daily. Give 2 tablet by mouth as needed for Pain BID   ALPRAZolam  (XANAX ) 0.5 MG tablet Take 1 tablet (0.5 mg total) by mouth 3 (three) times daily.   amiodarone  (PACERONE ) 100 MG tablet Take 1 tablet (100 mg total) by mouth daily.   apixaban  (ELIQUIS ) 2.5 MG TABS tablet Take 1 tablet (2.5 mg total) by mouth 2 (two) times daily.   cycloSPORINE  (RESTASIS ) 0.05 % ophthalmic emulsion Place 1 drop into both eyes 2 (two) times daily.   DEXTROMETHORPHAN-BENZOCAINE MT Use as directed in the mouth or throat. Give 1 lozenge by mouth every 2  hours as needed for sore throat until 02/19/2024 12:20 may use up to six times   hydrocortisone  cream 1 % Apply 1 Application topically. Apply to Hemorrhoid topically every 24 hours as needed for Apply to external hemorrhoid QD related to FIRST DEGREE HEMORRHOIDS (K64.0   lactose free nutrition (BOOST) LIQD Take 237 mLs by mouth in the morning, at noon, and at bedtime.   latanoprost  (XALATAN ) 0.005 % ophthalmic solution Place 1 drop into both eyes at bedtime.   lidocaine  (LIDODERM ) 5 % Place 1 patch onto the skin daily. Remove & Discard patch within 12 hours or as directed by MD   lithium  carbonate 150 MG capsule Take 1 capsule (150 mg total) by mouth 3 (three) times daily with meals. (Patient taking differently: Take 150 mg by mouth 2 (two) times daily. Give 1 capsule by mouth two times a day for Bipolar Administer with meals)   Menthol , Topical Analgesic, (BIOFREEZE) 10 % CREA Apply topically. Apply to Left Healed Shingles topically four times a day for Pain   Multiple Vitamin (MULTIVITAMIN WITH MINERALS) TABS tablet Take 1 tablet by mouth daily.   ondansetron  (ZOFRAN ) 4 MG tablet Take 4 mg by mouth every 6 (six) hours as needed for nausea.   polyethylene glycol (MIRALAX  / GLYCOLAX ) 17 g packet Take 17 g by mouth every other day.   SYSTANE 0.4-0.3 % SOLN Place 1 drop into both eyes every 4 (four) hours as needed (for dryness).   Timolol  Maleate, Once-Daily, 0.5 % SOLN Place 1 drop into both eyes daily.   topiramate  (TOPAMAX ) 25 MG tablet TAKE ONE TABLET BY MOUTH TWICE DAILY IN THE MORNING AND IN THE EVENING (Patient taking differently: Take 25 mg by mouth 2 (two) times daily.)   White Petrolatum-Mineral Oil (SYSTANE NIGHTTIME) OINT Place 1 Application into both eyes  at bedtime.   No facility-administered encounter medications on file as of 02/18/2024.    Review of Systems  Constitutional:  Negative for appetite change, fatigue and fever.  HENT:  Positive for hearing loss and  rhinorrhea. Negative for congestion.   Eyes:  Negative for visual disturbance.       Glaucoma, diplopia R+L lateral peripheral visual fields only.   Respiratory:  Positive for shortness of breath. Negative for cough and wheezing.        Occasionally DOE, hacking cough  Cardiovascular:  Negative for leg swelling.  Gastrointestinal:  Negative for abdominal pain and constipation.  Genitourinary:  Positive for frequency. Negative for dysuria and urgency.       Couple of times at night, no difficulty of returning asleep.   Musculoskeletal:  Positive for arthralgias, gait problem and neck pain.       Walker.   Skin:  Negative for color change.  Neurological:  Negative for tremors, speech difficulty, light-headedness and headaches.       Comes and goes nature of the neck/occipital pain. Stiff neck. Pain the patent right knee, s/p TKR. Tremor is better since Sinemet .   Psychiatric/Behavioral:  Positive for sleep disturbance. Negative for confusion. The patient is nervous/anxious.        Better mood, feels not as anxious or depressive mood as prior.     Immunization History  Administered Date(s) Administered   Fluad Quad(high Dose 65+) 11/03/2017, 10/30/2020   INFLUENZA, HIGH DOSE SEASONAL PF 11/02/2013, 11/02/2013, 11/03/2017, 10/26/2018, 10/27/2019, 11/13/2022   Influenza Split 10/24/2008, 09/26/2009, 10/08/2011, 10/05/2012   Influenza, Quadrivalent, Recombinant, Inj, Pf 10/11/2016   Influenza,inj,Quad PF,6+ Mos 11/01/2014, 10/11/2015, 10/11/2016   Influenza-Unspecified 10/24/2008, 09/26/2009, 10/08/2011, 10/05/2012, 11/02/2013, 10/11/2016, 11/05/2021   Moderna Covid-19 Vaccine Bivalent Booster 68yrs & up 11/13/2022   Moderna SARS-COV2 Booster Vaccination 06/13/2020   Moderna Sars-Covid-2 Vaccination 01/18/2019, 02/15/2019, 11/23/2019   Pfizer Covid-19 Vaccine Bivalent Booster 22yrs & up 11/15/2021   Pneumococcal Conjugate-13 08/20/2011   Pneumococcal Polysaccharide-23 01/14/2005,  06/29/2014   Pneumococcal-Unspecified 01/14/2005   Tdap 08/20/2011, 04/18/2023   Zoster Recombinant(Shingrix) 11/09/2018, 12/01/2018, 03/01/2019   Zoster, Live 10/08/2011   Pertinent  Health Maintenance Due  Topic Date Due   Influenza Vaccine  08/15/2023   Bone Density Scan  Completed   Mammogram  Discontinued      04/18/2023    8:59 AM 04/18/2023    2:40 PM 07/31/2023    1:01 PM 10/02/2023    8:51 AM 01/07/2024   12:05 PM  Fall Risk  Falls in the past year? 0 0 1 1 1   Was there an injury with Fall? 0  0  0  0  0  Was there an injury with Fall? - Comments   hit right knee     Fall Risk Category Calculator 0 0 1 1 2   Patient at Risk for Falls Due to No Fall Risks No Fall Risks No Fall Risks History of fall(s);Impaired balance/gait History of fall(s);Impaired balance/gait;Impaired mobility;Mental status change  Fall risk Follow up Falls evaluation completed Falls evaluation completed Falls evaluation completed Falls evaluation completed Falls evaluation completed;Education provided;Falls prevention discussed     Data saved with a previous flowsheet row definition   Functional Status Survey:    Vitals:   02/18/24 1151  BP: 136/63  Pulse: (!) 41  Resp: 18  Temp: 97.7 F (36.5 C)  SpO2: 96%  Weight: 119 lb 9.6 oz (54.3 kg)  Height: 5' 2 (1.575 m)   Body mass index  is 21.88 kg/m. Physical Exam Vitals and nursing note reviewed.  Constitutional:      Appearance: Normal appearance.  HENT:     Head: Normocephalic and atraumatic.     Nose: Nose normal.     Mouth/Throat:     Mouth: Mucous membranes are moist.  Eyes:     Extraocular Movements: Extraocular movements intact.     Conjunctiva/sclera: Conjunctivae normal.     Pupils: Pupils are equal, round, and reactive to light.  Neck:     Comments: Pain in neck and occiput when sitting up  No spinal spinous processes tenderness palpated.  Cardiovascular:     Rate and Rhythm: Normal rate and regular rhythm.     Heart  sounds: Murmur heard.     Comments: DP pulses present R+L. HR in 50s Pulmonary:     Effort: Pulmonary effort is normal.     Breath sounds: No rales.  Abdominal:     General: Bowel sounds are normal.     Palpations: Abdomen is soft.     Tenderness: There is no abdominal tenderness.  Musculoskeletal:     Cervical back: Normal range of motion and neck supple. Tenderness present.     Right lower leg: No edema.     Left lower leg: No edema.     Comments: Chronic R knee pain is improved. S/p TKR right. Felt lateral right thigh muscle is tight.  Stiff neck, comes and goes neck/occipital pain. Trace edema BLE. Arthritic joint change fingers.   Skin:    General: Skin is warm and dry.     Comments: Improved the medial lower left leg a palm sized thickened, slightly reddened area, mild discomfort when palpated. DP present left foot. Scattered varicose veins left leg 05/09/22 medical left MTJ callous, plantar aspect 3rd toe callous, the left 2nd toe pain after injury, but no bruise, deformity, swelling, redness, able to PROM w/o pain.    Neurological:     General: No focal deficit present.     Mental Status: She is alert. Mental status is at baseline.     Motor: No weakness.     Coordination: Coordination abnormal.     Gait: Gait abnormal.     Comments: Resting tremor in fingers, near resolution.  Oriented to person, place.   Psychiatric:        Mood and Affect: Mood normal.        Behavior: Behavior normal.        Thought Content: Thought content normal.     Labs reviewed: Recent Labs    10/22/23 0435 10/23/23 0106 10/25/23 0312 11/05/23 2240 11/07/23 0408 12/02/23 0850  NA 138 134* 141 138 147* 139  K 4.2 4.2 4.2 3.8 3.8 4.2  CL 112* 108 109 110 121* 112*  CO2 18* 17* 25 19* 20* 20  GLUCOSE 111* 117* 120* 124* 109*  --   BUN 38* 39* 29* 36* 18 21  CREATININE 0.64 0.59 0.62 0.95 0.83 0.9  CALCIUM 10.0 10.1 10.4* 11.6* 11.0* 10.7  MG 2.2 2.1 2.5*  --   --   --   PHOS 2.5 2.9  3.2  --   --   --    Recent Labs    10/20/23 0308 10/23/23 0106 11/05/23 2240 12/02/23 0850  AST 13* 17 20 14   ALT 9 11 15 8   ALKPHOS 100 93 86 66  BILITOT 0.2 0.2 0.3  --   PROT 4.8* 5.8* 6.1*  --   ALBUMIN  2.6* 3.2*  3.5 3.6   Recent Labs    10/26/23 0312 10/27/23 0247 11/05/23 2240 11/06/23 0606 11/06/23 2002 11/07/23 0408 11/08/23 0429 11/18/23 0850  WBC 6.8 6.8   < > 8.9  --  7.4 7.4 7.7  NEUTROABS 4.2 4.7  --   --   --   --   --  5,459.00  HGB 9.2* 8.8*   < > 9.3*   < > 8.6* 8.6* 11.2*  HCT 29.9* 28.8*   < > 29.4*   < > 27.4* 28.7* 34*  MCV 103.1* 103.6*   < > 103.2*  --  105.0* 105.5*  --   PLT 356 318   < > 248  --  236 246 351   < > = values in this interval not displayed.   Lab Results  Component Value Date   TSH 1.150 10/05/2023   No results found for: HGBA1C Lab Results  Component Value Date   CHOL 212 (H) 07/29/2023   HDL 62 07/29/2023   LDLCALC 121 (H) 07/29/2023   TRIG 65 10/20/2023   CHOLHDL 3.4 07/29/2023    Significant Diagnostic Results in last 30 days:  No results found.  Assessment/Plan HLD (hyperlipidemia) LDL 121 07/29/23, on diet, tolerated Pravastatin   Major neurocognitive disorder (HCC) 12/2019 MRI brain showed chronic microvascular ischemic changes, no acute disease with mild cortical atrophy, TSH 1.56 07/29/23  Cervical dystonia  Cervical degenerative changes/dystonia, better, CT/MRI 12/2019 ruled out acute process, aches in neck R>L travels to the back of head, comes and goes, not disabling, f/u Neurosurgery. Failed Cymbalta , Gabapentin , Robaxin , Ultracet . s/p Botulinum Toxin inj by Neurology. ESR 9, CRP 2.7 10/09/20(ESR 6 07/30/22). The patient stated Alprazolam  and Tylenol  are kind of effective. Improved on Topamax , but associated with weight loss.  No Parkinson's disease, took her off Sinemet  per Neurology. Currently taking inj    Hypercalcemia  f/u endocrinology, Ca 10.3 04/18/23, 10.4 10/25/23, 01/12/20<<10.6 08/06/23,  PTH 79  01/12/20. GDR of Lithium  may help  Bipolar disorder (HCC) stable, on Lithium , GDR due to bradycardia, takes Alprazolam  too, TSH 1.15 10/05/23. Declined Dr Cleotilde started low dose of Depakote . Lithium  level 0.4 07/30/22  Essential (primary) hypertension blood pressure is controlled off Losartan , Bun/creat 21/0.9 12/02/23  GERD (gastroesophageal reflux disease) GERD/Hx of UGIB, stable, on pantoprazole , Hgb 11.2 11/18/23  Slow transit constipation Stable, Metamucil, MiraLax ,  effective.   Dysphagia GI evaluated, EGD 03/25/22 wnl, working with ST, thicken liquids.   Paroxysmal atrial fibrillation (HCC) Heart rate is in control New onset of A-fib, follow-up cardiology, on amiodarone , resumed Eliquis  10/31/25after UGIB resolved.      Family/ staff Communication: plan of care reviewed with the patient and charge nurse.  Labs/tests ordered:  none         [1] Allergies Allergen Reactions   Lisinopril Cough   Penicillins Itching and Other (See Comments)    50 years ago   Atorvastatin Itching   Dilaudid  [Hydromorphone  Hcl] Itching   Tape Itching  "

## 2024-02-18 NOTE — Assessment & Plan Note (Signed)
Stable, Metamucil, MiraLax,  effective.

## 2024-02-18 NOTE — Assessment & Plan Note (Signed)
 LDL 121 07/29/23, on diet, tolerated Pravastatin 

## 2024-02-20 NOTE — Assessment & Plan Note (Signed)
 ASAP for Podiatrist eval and tx.
